# Patient Record
Sex: Male | Born: 1945
Health system: Southern US, Community
[De-identification: ages and names within clinical notes are randomized; demographics above are authoritative.]

## PROBLEM LIST (undated history)

## (undated) DIAGNOSIS — N200 Calculus of kidney: Secondary | ICD-10-CM

## (undated) DIAGNOSIS — K219 Gastro-esophageal reflux disease without esophagitis: Secondary | ICD-10-CM

## (undated) DIAGNOSIS — I4892 Unspecified atrial flutter: Secondary | ICD-10-CM

## (undated) DIAGNOSIS — K5792 Diverticulitis of intestine, part unspecified, without perforation or abscess without bleeding: Secondary | ICD-10-CM

## (undated) DIAGNOSIS — I10 Essential (primary) hypertension: Secondary | ICD-10-CM

## (undated) DIAGNOSIS — I639 Cerebral infarction, unspecified: Secondary | ICD-10-CM

## (undated) DIAGNOSIS — I2699 Other pulmonary embolism without acute cor pulmonale: Secondary | ICD-10-CM

## (undated) DIAGNOSIS — M1990 Unspecified osteoarthritis, unspecified site: Secondary | ICD-10-CM

## (undated) DIAGNOSIS — G4730 Sleep apnea, unspecified: Secondary | ICD-10-CM

## (undated) DIAGNOSIS — N179 Acute kidney failure, unspecified: Secondary | ICD-10-CM

## (undated) DIAGNOSIS — E785 Hyperlipidemia, unspecified: Secondary | ICD-10-CM

## (undated) DIAGNOSIS — R51 Headache: Secondary | ICD-10-CM

## (undated) DIAGNOSIS — F32A Depression, unspecified: Secondary | ICD-10-CM

## (undated) DIAGNOSIS — F1011 Alcohol abuse, in remission: Secondary | ICD-10-CM

## (undated) DIAGNOSIS — Z8719 Personal history of other diseases of the digestive system: Secondary | ICD-10-CM

## (undated) DIAGNOSIS — Z9289 Personal history of other medical treatment: Secondary | ICD-10-CM

## (undated) DIAGNOSIS — M549 Dorsalgia, unspecified: Secondary | ICD-10-CM

## (undated) DIAGNOSIS — F329 Major depressive disorder, single episode, unspecified: Secondary | ICD-10-CM

## (undated) DIAGNOSIS — R569 Unspecified convulsions: Secondary | ICD-10-CM

## (undated) DIAGNOSIS — I2510 Atherosclerotic heart disease of native coronary artery without angina pectoris: Secondary | ICD-10-CM

## (undated) HISTORY — DX: Depression, unspecified: F32.A

## (undated) HISTORY — DX: Atherosclerotic heart disease of native coronary artery without angina pectoris: I25.10

## (undated) HISTORY — DX: Diverticulitis of intestine, part unspecified, without perforation or abscess without bleeding: K57.92

## (undated) HISTORY — PX: APPENDECTOMY: SHX54

## (undated) HISTORY — PX: UMBILICAL HERNIA REPAIR: SUR1181

## (undated) HISTORY — PX: CERVICAL FUSION: SHX112

## (undated) HISTORY — DX: Acute kidney failure, unspecified: N17.9

## (undated) HISTORY — PX: PARTIAL COLECTOMY: SHX5273

## (undated) HISTORY — DX: Hyperlipidemia, unspecified: E78.5

## (undated) HISTORY — DX: Major depressive disorder, single episode, unspecified: F32.9

## (undated) HISTORY — DX: Alcohol abuse, in remission: F10.11

## (undated) HISTORY — DX: Personal history of other medical treatment: Z92.89

## (undated) HISTORY — PX: BACK SURGERY: SHX140

## (undated) HISTORY — PX: ORCHIECTOMY: SHX2116

## (undated) HISTORY — DX: Essential (primary) hypertension: I10

## (undated) HISTORY — PX: CARDIAC CATHETERIZATION: SHX172

## (undated) HISTORY — DX: Dorsalgia, unspecified: M54.9

## (undated) SURGERY — EGD (ESOPHAGOGASTRODUODENOSCOPY)
Anesthesia: Moderate Sedation

---

## 2001-01-26 ENCOUNTER — Other Ambulatory Visit: Admission: RE | Admit: 2001-01-26 | Discharge: 2001-01-26 | Payer: Self-pay | Admitting: Gastroenterology

## 2001-01-26 ENCOUNTER — Encounter (INDEPENDENT_AMBULATORY_CARE_PROVIDER_SITE_OTHER): Payer: Self-pay | Admitting: Specialist

## 2001-01-29 ENCOUNTER — Encounter: Admission: RE | Admit: 2001-01-29 | Discharge: 2001-01-29 | Payer: Self-pay | Admitting: Gastroenterology

## 2001-01-29 ENCOUNTER — Encounter: Payer: Self-pay | Admitting: Gastroenterology

## 2001-03-05 ENCOUNTER — Encounter (HOSPITAL_BASED_OUTPATIENT_CLINIC_OR_DEPARTMENT_OTHER): Payer: Self-pay | Admitting: Internal Medicine

## 2001-03-05 ENCOUNTER — Encounter: Admission: RE | Admit: 2001-03-05 | Discharge: 2001-03-05 | Payer: Self-pay | Admitting: Internal Medicine

## 2001-09-18 ENCOUNTER — Emergency Department (HOSPITAL_COMMUNITY): Admission: EM | Admit: 2001-09-18 | Discharge: 2001-09-18 | Payer: Self-pay | Admitting: Emergency Medicine

## 2001-09-18 ENCOUNTER — Encounter: Payer: Self-pay | Admitting: Emergency Medicine

## 2002-06-20 ENCOUNTER — Ambulatory Visit (HOSPITAL_COMMUNITY): Admission: RE | Admit: 2002-06-20 | Discharge: 2002-06-20 | Payer: Self-pay | Admitting: Family Medicine

## 2002-08-24 ENCOUNTER — Encounter: Payer: Self-pay | Admitting: General Surgery

## 2002-08-31 ENCOUNTER — Encounter (INDEPENDENT_AMBULATORY_CARE_PROVIDER_SITE_OTHER): Payer: Self-pay | Admitting: Specialist

## 2002-08-31 ENCOUNTER — Inpatient Hospital Stay (HOSPITAL_COMMUNITY): Admission: RE | Admit: 2002-08-31 | Discharge: 2002-09-06 | Payer: Self-pay | Admitting: General Surgery

## 2002-11-06 ENCOUNTER — Encounter: Payer: Self-pay | Admitting: Emergency Medicine

## 2002-11-06 ENCOUNTER — Emergency Department (HOSPITAL_COMMUNITY): Admission: EM | Admit: 2002-11-06 | Discharge: 2002-11-07 | Payer: Self-pay | Admitting: Emergency Medicine

## 2002-11-17 ENCOUNTER — Encounter: Payer: Self-pay | Admitting: Neurology

## 2002-11-17 ENCOUNTER — Ambulatory Visit (HOSPITAL_COMMUNITY): Admission: RE | Admit: 2002-11-17 | Discharge: 2002-11-17 | Payer: Self-pay | Admitting: Neurology

## 2003-01-06 ENCOUNTER — Emergency Department (HOSPITAL_COMMUNITY): Admission: EM | Admit: 2003-01-06 | Discharge: 2003-01-06 | Payer: Self-pay | Admitting: Emergency Medicine

## 2003-01-06 ENCOUNTER — Encounter: Payer: Self-pay | Admitting: Emergency Medicine

## 2003-01-07 ENCOUNTER — Ambulatory Visit (HOSPITAL_COMMUNITY): Admission: RE | Admit: 2003-01-07 | Discharge: 2003-01-07 | Payer: Self-pay | Admitting: Neurology

## 2003-08-27 DIAGNOSIS — I2699 Other pulmonary embolism without acute cor pulmonale: Secondary | ICD-10-CM

## 2003-08-27 HISTORY — PX: CORONARY ARTERY BYPASS GRAFT: SHX141

## 2003-08-27 HISTORY — DX: Other pulmonary embolism without acute cor pulmonale: I26.99

## 2003-12-21 ENCOUNTER — Inpatient Hospital Stay (HOSPITAL_COMMUNITY): Admission: AD | Admit: 2003-12-21 | Discharge: 2003-12-26 | Payer: Self-pay | Admitting: Cardiology

## 2004-01-16 ENCOUNTER — Encounter (HOSPITAL_COMMUNITY): Admission: RE | Admit: 2004-01-16 | Discharge: 2004-04-15 | Payer: Self-pay | Admitting: Cardiology

## 2004-02-10 ENCOUNTER — Emergency Department (HOSPITAL_COMMUNITY): Admission: EM | Admit: 2004-02-10 | Discharge: 2004-02-10 | Payer: Self-pay | Admitting: Emergency Medicine

## 2005-05-28 ENCOUNTER — Emergency Department (HOSPITAL_COMMUNITY): Admission: EM | Admit: 2005-05-28 | Discharge: 2005-05-28 | Payer: Self-pay | Admitting: Emergency Medicine

## 2005-06-05 ENCOUNTER — Inpatient Hospital Stay (HOSPITAL_COMMUNITY): Admission: EM | Admit: 2005-06-05 | Discharge: 2005-06-06 | Payer: Self-pay | Admitting: Emergency Medicine

## 2006-09-25 ENCOUNTER — Inpatient Hospital Stay (HOSPITAL_COMMUNITY): Admission: EM | Admit: 2006-09-25 | Discharge: 2006-09-28 | Payer: Self-pay | Admitting: Emergency Medicine

## 2006-09-25 ENCOUNTER — Ambulatory Visit: Payer: Self-pay | Admitting: Family Medicine

## 2006-09-30 ENCOUNTER — Encounter: Payer: Self-pay | Admitting: Cardiology

## 2006-09-30 ENCOUNTER — Inpatient Hospital Stay (HOSPITAL_COMMUNITY): Admission: EM | Admit: 2006-09-30 | Discharge: 2006-10-03 | Payer: Self-pay | Admitting: Emergency Medicine

## 2006-09-30 ENCOUNTER — Ambulatory Visit: Payer: Self-pay | Admitting: Internal Medicine

## 2006-10-02 ENCOUNTER — Encounter (INDEPENDENT_AMBULATORY_CARE_PROVIDER_SITE_OTHER): Payer: Self-pay | Admitting: *Deleted

## 2006-10-22 ENCOUNTER — Ambulatory Visit (HOSPITAL_COMMUNITY): Admission: RE | Admit: 2006-10-22 | Discharge: 2006-10-22 | Payer: Self-pay | Admitting: Emergency Medicine

## 2006-11-09 ENCOUNTER — Inpatient Hospital Stay (HOSPITAL_COMMUNITY): Admission: EM | Admit: 2006-11-09 | Discharge: 2006-11-15 | Payer: Self-pay | Admitting: Emergency Medicine

## 2009-07-31 ENCOUNTER — Encounter: Admission: RE | Admit: 2009-07-31 | Discharge: 2009-07-31 | Payer: Self-pay | Admitting: Neurosurgery

## 2009-09-01 ENCOUNTER — Ambulatory Visit (HOSPITAL_COMMUNITY): Admission: RE | Admit: 2009-09-01 | Discharge: 2009-09-02 | Payer: Self-pay | Admitting: Neurosurgery

## 2010-06-19 ENCOUNTER — Ambulatory Visit: Payer: Self-pay | Admitting: Cardiology

## 2010-11-11 LAB — BASIC METABOLIC PANEL
Chloride: 102 mEq/L (ref 96–112)
Creatinine, Ser: 0.92 mg/dL (ref 0.4–1.5)
GFR calc Af Amer: >60 mL/min (ref >60)
Potassium: 4.2 mEq/L (ref 3.5–5.1)

## 2010-11-11 LAB — CBC
HCT: 46.4 % (ref 39.0–52.0)
MCV: 97.3 fL (ref 78.0–100.0)
RBC: 4.77 MIL/uL (ref 4.22–5.81)
WBC: 9.1 10*3/uL (ref 4.0–10.5)

## 2010-11-23 ENCOUNTER — Other Ambulatory Visit: Payer: Self-pay | Admitting: *Deleted

## 2010-11-23 DIAGNOSIS — I2510 Atherosclerotic heart disease of native coronary artery without angina pectoris: Secondary | ICD-10-CM

## 2010-11-23 MED ORDER — NITROGLYCERIN 0.4 MG SL SUBL: 0.4000 mg | SUBLINGUAL_TABLET | SUBLINGUAL | Status: DC | PRN

## 2010-11-23 NOTE — Telephone Encounter (Signed)
escribe medication per fax request  

## 2011-01-11 NOTE — H&P (Signed)
[name], [name] NO.:  [id number]   MEDICAL RECORD NO.:  [id number]          PATIENT TYPE:  INP   LOCATION:  2903                         FACILITY:  [facility]   PHYSICIAN:  Elveria Rising. Damita Dunnings, M.D. DATE OF BIRTH:  May 09, 1946   DATE OF ADMISSION:  09/29/2006  DATE OF DISCHARGE:                              HISTORY & PHYSICAL   CHIEF COMPLAINT:  Consult for continuity of care.   HISTORY OF PRESENT ILLNESS:  The patient is a 65 year old [name] male  with a history of recent admission for rehydration with subsequent acute  renal failure.  This was resolved when the patient was discharged home.  He returned to [facility] one day later after several hours of feeling  weak.  He was diagnosed with atrial flutter in the emergency room and  was treated with adenosine and diltiazem.  He was admitted to the  cardiology service.  He is now in sinus tachycardia.  Note that he has  had a history of intermittent antibiotic treatment for the last few  months by Dr. [name], his GI doctor.  He has had increasing diarrhea  after he was discontinued from antibiotics a few weeks ago.  Now he has  persistent profuse diarrhea but little to no abdominal pain.   PAST MEDICAL HISTORY:  1. History of CABG.  2. History of total colectomy in 2004.  3. History of seizure disorder.  4. Major depressive disorder.  5. Orchiectomy.  6. History of umbilical hernia repair.  7. Dyslipidemia.  8. History of appendectomy.  9. History of cervical fusion.  10.Asthma as a child.  11.Other history as above.   SOCIAL HISTORY:  He has a history of significant alcohol use, but none  now.  He is not smoking.  He lives with his wife.  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   FAMILY HISTORY:  His father died in his 68s of a AAA.  His mother died  at 30 of CHF and old age.  His daughter died at age 62 with brain  cancer.   MEDICATIONS:  Currently Maxipime, Aricept, Lovenox, Lexapro, Zetia,  Keppra,  Ambien, Xopenex, Zocor, Protonix, Corgard, Provigil.   ALLERGIES:  DOXYCYCLINE AND KEFLEX.   REVIEW OF SYSTEMS:  Positive fever and chills.  He has occasional dry  cough with scant to no sputum production.  He has minimal dysuria.  He  was recently catheterized with a Foley.  He has no vomiting.  He is  taking p.o. with no rash.  Otherwise has no new symptoms.   LABORATORY DATA:  Stool cultures are negative.  Blood cultures and urine  cultures are pending.  Troponins went from 0.09 to 0.10.  EKG shows  sinus tachycardia now.  Electrolytes within normal limits.  TSH 2.7, BNP  122, Kreis count 7.6.  Cortisol, C. difficile, Gram stain, echo, and  right upper quadrant ultrasound were all pending.   PHYSICAL EXAMINATION:  Vital signs:  Temperature 100.6 this morning.  Now currently afebrile.  Pulse 93.  Blood pressure 101/57.  SaO2 is 97%.  Respiratory rate  20.  GENERAL:  In no apparent distress.  HEENT:  Normocephalic, atraumatic. Mucous membranes are dry.  Neck:  Supple, no lymphadenopathy.  CARDIOVASCULAR:  Tachy with no murmur, S1 and S2 are within normal  limits.  He has a positive sternotomy scar.  RESPIRATORY:  Course breath sounds bilaterally with slight wheeze.  ABDOMEN:  Soft, positive bowel sounds, nontender.  No right upper  quadrant pain, no rebound.  No guarding.  EXTREMITIES:  No edema.  NEURO:  Motor and sensation along with cranial nerves are intact.  SKIN:  No rash.   ASSESSMENT/PLAN:  The patient is a 65 year old with the following  problems:  1. Diarrhea, Clostridium difficile pending.  He had a mild increase in      his LFTs.  His abdominal ultrasound is pending.  Will also talk to      Dr. [name] regarding his recent antibiotic use.  He is covered      with Cefepime in the meantime.  We will continue with IV      supplementation p.r.n. and follow his electrolytes.  __________      may also be reasonable given the amount of diarrhea he is having.  2. Sinus  tachycardia.  The patient has atrial flutter which has      resolved.  His troponins are mildly increased secondary to strain      per cardiology.  Will continue IV fluids.  This is potentially      secondary to dehydration, +/- fever.  He is on a beta blocker per      cardiology.  3. Hypotension, secondary to problem #1.  His cortisol is pending.  4. Seizure disorder, on Keppra.  5. Deep venous thrombosis and gastrointestinal prophylaxis of Lovenox      and proton pump inhibitor.  6. Wheeze, continue Xopenex.  7. Will transfer to [facility].  I was unable to      leave orders as he was being wheeled out of the room for his echo      and ultrasound.  Family Practice Teaching Service will follow up.      Elveria Rising Damita Dunnings, M.D.     GSD/MEDQ  D:  09/30/2006  T:  10/01/2006  Job:  [id number]

## 2011-01-11 NOTE — Discharge Summary (Signed)
NAME:  [name], [name] NO.:  [id number]   MEDICAL RECORD NO.:  [id number]          PATIENT TYPE:  INP   LOCATION:  0375                         FACILITY:  [facility]   PHYSICIAN:  Doree Albee, M.D.DATE OF BIRTH:  1946-04-08   DATE OF ADMISSION:  11/09/2006  DATE OF DISCHARGE:  11/15/2006                               DISCHARGE SUMMARY   ADDENDUM:   FINAL DISCHARGE DIAGNOSES:  1. Chronic diarrhea with acute diarrhea secondary to probable      campylobacter species.  2. Altered mental status/delirium, resolved.  3. Dehydration and acute renal failure, resolved.  4. Seizure disorder.  5. Hypertension.  6. Obstructive sleep apnea syndrome.   DISCHARGE MEDICATIONS:  1. Aspirin 81 mg daily.  2. Keppra 750 mg b.i.d.  3. Lexapro 10 mg daily.  4. Ativan 1 mg b.i.d.  5. Multivitamins 1 tablet daily.  6. Corgard 40 mg daily.  7. Biaxin 500 mg b.i.d. for 5 days only.   CONDITION ON DISCHARGE:  Stable.   HISTORY OF PRESENT ILLNESS:  This 65 year old man was admitted with  diarrhea, which was acute on chronic and some altered mental status.  Please see discharge summary done by Dr. Vernell Leep.   HOSPITAL COURSE:  The patient continued to improve with his diarrhea and  on the day of discharge, he only had loose stools with no abdominal pain  and no fever.   PHYSICAL EXAMINATION:  VITAL SIGNS:  Temperature 98, blood pressure  154/88, pulse 65, respiratory rate 12. Saturation 96% on room air.  LUNGS:  Fields were clear to auscultation.  ABDOMEN:  Soft and nontender.   LABORATORY DATA:  Sodium 138, potassium 3.7, bicarbonate 25, BUN 10,  creatinine 0.9.   DISPOSITION:  He was treated for campylobacter species with Biaxin 500  mg b.i.d. for 5 days and he should complete this course. Otherwise, he  will continue with the rest of his medication.   FOLLOWUP:  I have urged him to followup with his neurologist in [facility], Dr. Ermalene Postin, in terms of his  medications that he is taking and also  to followup with his primary care physician, which I believe is at the  urgent care in the next 2 to 4 weeks.      Doree Albee, M.D.  Electronically Signed     NCG/MEDQ  D:  11/15/2006  T:  11/15/2006  Job:  [id number]

## 2011-01-11 NOTE — Discharge Summary (Signed)
[name], [name] NO.:  [id number]   MEDICAL RECORD NO.:  [id number]          PATIENT TYPE:  INP   LOCATION:  3701                         FACILITY:  [facility]   PHYSICIAN:  Kasandra Knudsen, M.D.   DATE OF BIRTH:  Feb 01, 1946   DATE OF ADMISSION:  09/29/2006  DATE OF DISCHARGE:  10/03/2006                               DISCHARGE SUMMARY   ADMITTING DIAGNOSES:  1. Diarrhea.  2. Questionable sepsis.  3. Seizure disorder.  4. Sinus tachycardia.  5. History of coronary artery bypass graft.  6. Depression.   DISCHARGE DIAGNOSES:  1. Diarrhea.  2. Urinary tract infection.  3. Seizure disorder.  4. History of coronary artery bypass graft.  5. Depression.  6. Wheezing.   HOSPITAL COURSE:  This is a 65 year old [name] male who was recently  discharged on February 3 and subsequently returned to the emergency room  with questionable atrial flutter, sinus tachycardia and hypotension.  He  was initially admitted to the cardiology service and was then  transferred to the [facility] Service.   HOSPITAL COURSE:  1. Questionable sepsis:  Initially, it was felt that this gentleman      had sepsis given his hypotension and hemodynamic instability.  He      also had a low grade fever.  Blood cultures were drawn and      initially showed a gram-positive cocci and clusters.  Therefore,      vancomycin was started.  This patient also received fluids to help      with his hypotension.  His questionable atrial flutter was treated      with adenosine and diltiazem initially; however, cardiology feels      that he was in sinus tachycardia, he never was in atrial flutter.      He resumed for certain to normal sinus rhythm the next day after      his admission.  As for his sepsis, we continued the vancomycin      until the blood cultures came back, which showed it was a      contaminant.  We then switched him over to p.o. Cipro given that      his urine  culture did grow out E.Coli.  Therefore, we feel that      some of his illness was related to a urinary tract infection.  He      was discharged with a 10 more day course of antibiotics.  2. Diarrhea:  This patient is known to have Crohn disease and had a      colectomy a number of years ago.  He continued to have diarrhea,      though it has been worsening.  GI was consulted and was following      the patient and they did a flex sig and an upper GI follow through      while he was here.  They are unclear as the etiology, however,      restarted him back on his Xifaxan, which seemed to help his      diarrhea.  His flex sig did not show any malignancy or anything      abnormal.  His stool cultures were negative.  It is unknown as to      why he has this diarrhea.  It did improve over the hospital course,      however.  3. Sinus tachycardia:  Initially, it was thought this patient had      atrial flutter; however, cardiology feels that it never was atrial      flutter.  They did give him adenosine and diltiazem; however, on      day of discharge he was in normal sinus rhythm and did not have      further problems.  He does have cardiologist that he will follow up      with as needed.  4. Seizure disorder:  We continued this patient's Keppra while he was      in the hospital.  He did not have any seizures.  He was discharged      on his home regimen.  He will follow up with his primary care      physician.  5. Urinary tract infection:  Please see above regarding sepsis.      However, urine cultures show E.Coli that was pansensitive.  We      switched him over to ciprofloxacin given his allergies.  On day of      discharge, he was much improved and was not having any symptoms and      overall looked very well.  6. Wheezing:  This patient continued to have wheezes throughout his      hospital stay despite scheduled Xopenex.  However, at time of      discharge, he was remarkably improved.  He  needs to follow up with      his primary care physician to explore potential of asthma.  There      is a remote history of asthma as a child per my previous H&P that      was dictated.   DISCHARGE MEDICATIONS:  1. Keppra 750 one tablet p.o. b.i.d.  2. Aricept 10 mg 1 tablet p.o. q.h.s.  3. Provigil 200 mg 1 tablet p.o. daily.  4. Zetia 10 mg 1 tablet p.o. daily.  5. Lexapro 10 mg 1 tablet p.o. q.h.s.  6. Zocor 1 tablet p.o. q.h.s.  7. Corgard 120 mg 1 tablet p.o. daily.  8. Albuterol 1-2 puffs q.4-6 hours p.r.n. wheezing.  9. Cipro 500 mg 1 tablet p.o. b.i.d. x10 more days.  10.Ambien 10 mg 1 tablet p.o. q.h.s.  11.Prednisone 20 mg take 3 tablets daily x7 days.  12.Robitussin DM 1 teaspoon q.6 hours as needed for cough.  13.Xifaxan 200 mg take 2 tablets p.o. b.i.d.   DISCHARGE INSTRUCTIONS:  Patient was discharged with instructions to  follow up with his primary care [name], which is [facility], early in the week.  He also has an appointment with a  gastroenterologist and he will need a call for a followup appointment.  He needs to see the GI doctors in the next 2-6 weeks.   DISCHARGE CONDITION:  Improved.   IMAGES:  Images that were performed during this hospital stay were an  upper GI small bowel.  There was surgical biopsies taken and a flex sig.  Chest x-ray x2.  Abdominal x-ray x1.  An abdominal ultrasound x1 and a  2D echo.  Please note that all films were essentially negative and the  echo showed a normal ejection fraction.   PERTINENT LABS:  Stool cultures negative.  An initial blood culture that  was identified as staph species, Coag negative.  It was decided that  this was contaminant.  C.diff negative.  Basic metabolic panel, on day  of discharge, was within normal limits.  CBC, on day of discharged,  showed an elevated [name] blood cell count of 12.5; however, this was improved over the hospital course.  He does have anemia, 11.1, this has  been  relatively stable.  Urine cultures, as previously dictated, E.Coli.  All other labs, which included a TSH, which was normal.  BNP was mildly  elevated at 122.  Magnesium low at 1.3.  His troponins, although mildly  elevated, per cardiology, this is secondary to his tachycardia.  The  highest the troponin got was 0.48.  This concludes the pertinent labs  during his hospital course.           ______________________________  Kasandra Knudsen, M.D.     JT/MEDQ  D:  10/05/2006  T:  10/06/2006  Job:  [id number]   cc:   [facility]  Almyra Free A. Jimmye Norman, M.D.

## 2011-01-11 NOTE — Discharge Summary (Signed)
[name], CREAR NO.:  [id number]   MEDICAL RECORD NO.:  [id number]          PATIENT TYPE:  INP   LOCATION:  6629                         FACILITY:  [facility]   PHYSICIAN:  Vernell Leep, MD     DATE OF BIRTH:  [birth year]   DATE OF ADMISSION:  11/09/2006  DATE OF DISCHARGE:                               DISCHARGE SUMMARY   This is an interim discharge summary.   PRIMARY CARE PHYSICIAN:  at [facility].   DISCHARGE DIAGNOSES:  1. Chronic diarrhea.  2. Altered mental status/delirium.  3. Dehydration and acute renal failure.  4. Seizure disorder.  5. Hypertension.  6. Obstructive sleep apnea syndrome.  7. B12 deficiency   DISCHARGE MEDICATIONS:  To be finalized on actual discharge.   PROCEDURES:  1. November 09, 2006:  Chest x-ray.  Impression:  Status post CABG.      Question minimal left lower lobe atelectasis.  2. November 09, 2006:  CT of the head without contrast.  Impression:  No      acute intracranial abnormality with chronic microvascular ischemic      change and scattered infarcts, as above.  Acute bilateral maxillary      sinusitis.   LAB DATA:  To be finalized on actual discharge.  Ammonia normal at 28,  hemoglobin A1C 6.1, TSH 1.851, B12 255.  Urine toxicology positive for  benzodiazepines.   CONSULTATIONS:  1. GI:  [facility].  2. Neurology:  Dr. Brett Fairy, pending.  3. Psychiatric:  Dr. Rhona Raider, pending.   HOSPITAL COURSE:  For details of the initial part of the admission,  please refer to the history and physical note that was done by Dr.  Lucy Chris on November 09, 2006.  In summary, Mr. Deyton is a 38-year-  old pleasant male patient status post colectomy for Crohn's disease,  seizure disorder, who was recently hospitalized for diarrhea and acute  renal failure, represented to the emergency room, brought by his wife  with acute onset of altered mental status of two days duration with  hallucinations, increased somnolence, disorientation,  and profuse  diarrhea.  He was admitted for further evaluation and management.   1. Diarrhea, which seems to be an acute-on-chronic diarrhea.  The      etiology of which is still undetermined.  The differential      diagnoses include infectious versus inflammatory versus      malabsorption syndrome.  Patient was admitted to the hospital.  His      stool studies were sent off for ova, cysts, and parasite, culture      and C. diff, which have so far been negative.  The stool      lactoferrin, however, is positive.  GI was consulted, who proceeded      to place a rectal tube.  The patient also was placed on IV fluids.      Will follow GI's recommendations.   1. Altered mental status, the etiology of which is also unclear.  The      differential diagnosis includes overdose of multiple medications  that the patient was on at home, which included sleeping pills,      antidepressants.  Also, the other differential diagnosis included      delirium from his acute illness, related to psychiatric illness or      seizures.  The patient was initially placed on a small dose of      benzodiazepine to prevent withdrawal because he was on      benzodiazepines at home; however, the patient continued to be      agitated, where he required to be restrained.  Patient was then      placed on Ativan withdrawal protocol.  He was also provided      thiamine and folate.  The patient's mental status has made slight      improvement since then.  The patient is now less frequently      agitated.  He is oriented in person and place.  He is pleasantly      confused.  He obeys commands.  He is able to hold small      conversations.  Neurology and a psychiatric consult has been      requested.  An EEG has been ordered and is pending.  The patient's      ammonia level is normal.  Patient has been continued on his      antiepileptic medication of Keppra.  The pills that the patient      might have been taking at  home are Ativan, unknown dose, Ambien,      Benadryl, temazepam.   1. Dehydration and acute renal failure which was secondary to his      profuse diarrhea:  Patient was IV hydrated, and this has since      resolved.  Patient's creatinine on admission was 2.32, which has      now normalized.   1. Seizure disorder:  There are no obvious episodes of seizures in the      hospital, as indicated.  EEG has been requested and is pending.      Patient was continued on his Keppra, and neuro consult is pending.   1. Hypertension, which has been fairly controlled.   1. Obstructive sleep apnea syndrome:  Patient has been placed on home      settings of his BiPAP.   1. Coronary artery disease:  Patient has not complained of any chest      pain, and he is status post CABG.  The patient is continued on his      aspirin.   1. B12 deficiency. Patient received an IM B12 injection and followed      by oral B12. Further eval as deemed necessary as outpatient and      rechek his B12 levels.      Vernell Leep, MD  Electronically Signed     AH/MEDQ  D:  11/12/2006  T:  11/12/2006  Job:  [id number]   cc:   Larey Seat, M.D.  Fax: [phone]   Felizardo Hoffmann, M.D.

## 2011-01-11 NOTE — Consult Note (Signed)
NAME:  RANGEL, ECHEVERRI                           ACCOUNT NO.:  [id number]   MEDICAL RECORD NO.:  [id number]                   PATIENT TYPE:  EMS   LOCATION:  ED                                   FACILITY[facility]   PHYSICIAN:  Alyson Locket. Love, M.D.                 DATE OF BIRTH:  [birth year]   DATE OF CONSULTATION:  01/06/2003  DATE OF DISCHARGE:                                   CONSULTATION   PATIENT'S ADDRESS:  [address] [place], [employer], [address] [place], [place].   INTRODUCTION:  This 65 year old, right-handed, [employer] married male has a  known history of hypertension and prior history of syncope versus seizure.  Today, he is seen in the emergency room for evaluation of a suspected  seizure.   HISTORY OF PRESENT ILLNESS:  The patient had an episode on November 06, 2002,  when he was in a grocery store.  He was found on the floor, was confused and  somewhat combative afterwards when seen in the [facility] Emergency  Room.  A CT scan of the brain was performed which was unremarkable, and  blood studies were negative.  As an outpatient MRI study and EEG were  unremarkable, and he was placed on seizure precautions but no medications.  He has no history of drug or alcohol abuse.  He had been on chronic Ativan  therapy, discontinued several months ago.  He had been continuing his job at  work and doing limited driving.  On the day of his visit to the emergency  room he had been at home after work, getting ready to ride a riding  lawnmower.  He turned it on.  He apparently became confused.  He remembers  looking up and felt something grab me.  He was then on his lawnmower.  He  remembers going around in a circle on the lawn, got off, tried to go inside,  and was fumbling with the door knob.  His wife noted that he was very [place],  pale, and called 911.  He apparently had bitten his tongue.  There was no  urinary or bowel  incontinence.  There has been no history of megalopsia,  micropsia, deja vu, strange odors or tastes.   MEDICATIONS:  1. Corgard 120 mg b.i.d.  2. Zocor 80 mg daily.  3. Prinivil 20 mg daily.   SOCIAL HISTORY:  He does not drink alcohol.   PHYSICAL EXAMINATION:  GENERAL:  Well-developed Junker male.  VITAL SIGNS:  Blood pressure right and left arms 140/80, heart rate is 84.  NECK:  There were no cranial, cervical, or other bruits heard.  Neck flexion  and extension maneuvers were unremarkable.  NEUROLOGIC:  Mental status:  He was alert and oriented x3.  His cranial  nerve examination revealed visual fields full.  Discs were flat.  The  extraocular movements were full.  Corneas were present.  There was no  seventh nerve palsy.  Hearing was intact.  Air conduction was greater than  bone conduction.  Tongue was midline.  The uvula was midline.  Gags were  present.  Motor examination revealed good strength in the upper and lower  extremities.  Coordination testing was normal, and sensory examination was  intact to pinprick, light touch, normal position and vibration testing.  Deep tendon reflexes were 1 to 2+, and plantar responses were downgoing.    IMPRESSION:  Seizures x2, Code 345.1.   PLAN:  Our plan is to obtain an EEG, place him on seizure precautions, and  start anticonvulsants after the EEG.                                               Alyson Locket. Erling Cruz, M.D.    JML/MEDQ  D:  01/06/2003  T:  01/06/2003  Job:  [id number]

## 2011-01-11 NOTE — H&P (Signed)
NAME:  BLISS, TSANG NO.:  [id number]   MEDICAL RECORD NO.:  [id number]          PATIENT TYPE:  EMS   LOCATION:  MAJO                         FACILITY:  [facility]   PHYSICIAN:  Jonna L. Gwynneth Aliment, M.D.DATE OF BIRTH:  [birth year]   DATE OF ADMISSION:  06/04/2005  DATE OF DISCHARGE:                                HISTORY & PHYSICAL   PRIMARY CARE PHYSICIAN:  Urgent [facility].   NEUROLOGIST:  Dr. Ermalene Postin in [place], [place].   CARDIOLOGIST:  Peter M. Martinique, M.D.   CHIEF COMPLAINT:  Does not feel good.   HISTORY OF PRESENT ILLNESS:  This is a 65 year old [place] male with a history  of seizures who has presently been on Keppra.  At one point 3 years ago he  had been on Depakote. The description of these seizures are described as  confusion, getting very red in the face, and altered mental status.  The  descriptions do not seem to be major tonic clonic.  The last few  descriptions that have been present.  The last time he had an episode was  May 28, 2005, which his wife again describes as more of a confusion.  He  does not have episodes where he stares into space.  He has been having  issues with depression and so was started on Remeron on October 4, and ever  since then he has been increasingly confused, restless, having difficulty  sleeping, picking at clothes.   ALLERGIES:  DOXYCYCLINE, KEFLEX, TALWIN.   MEDICATIONS:  1.  Prinivil 40.  2.  Corgard 120.  3.  Keppra 750 b.i.d.  4.  Zocor 80.  5.  Zetia 10.  6.  Ativan 1 mg b.i.d.   PAST MEDICAL HISTORY:  1.  Obstructive sleep apnea, however, he has not been doing well with the      BiPAP.  He tends to pull it off and refuse to use it so he has been      having increasing difficulty with sleeping and has often been having      episodes of being very drowsy and confused during the day.  2.  Hypertension.  3.  Hypercholesterolemia.  4.  Coronary artery disease.  He had an episode of one of these  confusions      and got red in the face and when they brought him to the Urgent Care, he      was found to have an abnormal EKG.  He was put in the hospital and had a      subsequent coronary artery bypass grafting for extensive three or four-      vessel disease.  5.  Depression.  The patient's daughter died 3 years ago from a brain tumor      and every July on the anniversary date, the patient becomes very      depressed.  However, this year on top of that depression he was passed      over for a job because of his health and has become more depressed about      that.  FAMILY HISTORY:  His father died at 19 of a thoracic aneurysm.  Mother died  at 77 with dementia and heart failure.  He has one sister who is healthy.   SOCIAL HISTORY:  Married.  One daughter lives in [place] and one died 3  years ago from a tumor.  He is a nonsmoker.  A number of years ago, he had  become addicted to alcohol and prescription drugs.  He has not been so for a  number of years, but he tends to get hooked on medications very easily and  his wife makes sure she locks everything up.   REVIEW OF SYSTEMS:  No fever or chills.  He has problems with loose bowels  ever since he had his colectomy.  Complains of low back pain.  He has  recently had a runny nose and cough.  He has been hard of hearing for many  years.  Other review of systems were asked and were negative.   PAST SURGICAL HISTORY:  Colectomy, coronary artery bypass grafting, cervical  laminectomy 12 years ago, umbilical hernia, undescended testicle,  appendectomy, tonsillectomy, right wrist bone aneurysm, and inguinal  hernias.  He had an undescended testicle removed.   PHYSICAL EXAMINATION:  VITAL SIGNS:  97.1, pulse 83, respirations 18, blood  pressure 119/73.  GENERAL:  He is a well-developed, Caucasian male with occasionally slurred  speech.  He is tired but arousable and does follow commands.  HEENT:  Conjunctivae and lids were normal.   Pupils are reactive.  Extraocular movements were full.  Normal hearing.  Mucosa and pharynx.  NECK:  Showed no mass or thyromegaly.  There were no carotid bruits.  LUNGS:  Respiratory effort was normal.  Lungs were clear to A&P with no  wheezing, rales, rhonchi, or dullness.  HEART:  Regular rate and rhythm.  Normal S1 and S2 without murmurs, rubs, or  gallops.  Her had a sternotomy scar.  There is no cyanosis, clubbing, or  edema.  ABDOMEN:  Nontender, normal bowel sounds, no hepatosplenomegaly or hernia.  He has multiple surgical scars.  Normal external genitalia.  No cervical  adenopathy.  NEUROLOGY:  Muscle strength 5/5 in all four extremities.  No cogwheeling or  tremor. DTR's were 2+ and equal.  Sensation was intact.  Alert and oriented  x3.  Normal memory, judgment, and affect.   LABORATORY DATA:  CBC, alcohol level, chemistry, cardiac enzymes were all  negative.  Drugs of abuse was positive only for benzodiazepine.   CT of the head showed a progressive encephalomalacia, multiple small vessel  disease, new caudate CVA, some other abnormalities.  Princess Bruins. Hickling,  M.D. thinks there may be an area that is suspicious for a tumor.   IMPRESSION:  1.  Altered mental status, most likely from his new Remeron medicine, but it      could be postictal or the Remeron could have triggered the seizure.  I      am going to discontinue the Remeron.  The patient seemed to be getting      better, according to the wife.  2.  Abnormal CAT scan of the head.  We will have an MRI and EEG.  3.  Coronary artery disease.  4.  Hypercholesterolemia.  5.  Obstructive sleep apnea.  We will try to get his mask readjusted.  Sleep      deprivation also can trigger more      seizures.  6.  Depression.  We will stop the  Remeron and give him a trial of Desyrel.  7.  Seizure disorder.      Jonna L. Gwynneth Aliment, M.D.  Electronically Signed    JLB/MEDQ  D:  06/05/2005  T:  06/05/2005  Job:  [id number]

## 2011-01-11 NOTE — Consult Note (Signed)
NAME:  [name], [name] NO.:  [id number]   MEDICAL RECORD NO.:  [id number]          PATIENT TYPE:  INP   LOCATION:  5956                         FACILITY:  [facility]   PHYSICIAN:  John C. Amedeo Plenty, M.D.    DATE OF BIRTH:  1946/05/01   DATE OF CONSULTATION:  11/09/2006  DATE OF DISCHARGE:                                 CONSULTATION   CONSULTATIONS:  Diarrhea, azotemia, and confusion.   HISTORY OF PRESENT ILLNESS:  The patient is a 65 year old [name] male who  presents for the second or third time with refractory diarrhea,  associated with azotemia and mental status changes. He was in for  admission from September 25, 2006 until October 05, 2006 with similar  presentation. Creatinine is high at 4 with BUN of 55. He was felt to be  clinically dehydrated. He was treated with hydration and his azotemia  resolved. He underwent flexible sigmoidoscopy, upper GI and small bowel  series. The patient had undergone subtotal colectomy in 2004 for  refractory diverticulitis. He has had loose stools since then but more  episodic severe diarrhea since then. He had seen Dr. [name] 5 days ago  and had apparently been taken off of __________, which he has stayed on  to control his diarrhea. Three days ago, his wife noted that he was  having diarrhea, malaise and the next day, had continued diarrhea and  malaise and lethargy. Yesterday morning, he continued to feel poorly and  went to urgent care. He was noted to have hypotension. He was given IV  fluids and felt better. He was sent home. His diarrhea worsened and he  became lethargic, confused, and was brought back last night. The wife  states that he gets medications from the neurologist and also the [facility]. It is concerning that they have been getting Ativan and a  sleeping pill from more than 1 source or taking multiple doses  unwittingly, relative to confusion although she is not clear on any of  this. She also is afraid that he  may have taken both his p.m. and a.m.  blood pressure medications, in the a.m. of November 08, 2006, when he was  found at urgent care to have hypotension. He is still fidgety and  somewhat confused today and still having diarrhea, which is not bloody.  Denies any abdominal pain or vomiting.   PAST MEDICAL HISTORY:  Diverticulitis with status post sub-total  colectomy in 2004.  1. Coronary artery disease.  2. Hyperlipidemia.  3. History of alcohol abuse.  4. History of seizure disorder.  5. History of atrial flutter.   CURRENT MEDICATIONS:  Are not available to me at this point. The  patient's wife relies on his knowledge for these medications and he is  confused. Apparently, the medicines have been given to Dr. Selinda Flavin, who  admitted him.   PAST SURGICAL HISTORY:  CABG, sub-total colectomy, orchiectomy,  umbilical hernia repair, appendectomy, and cervical fusion.   ALLERGIES:  DOXYCYCLINE, KEFLEX.   SOCIAL HISTORY:  The patient lives with his wife. He is a retired  grounds  Therapist, nutritional. Denies cigarette smoking.   FAMILY HISTORY:  Father died of a ruptured aortic aneurysm. He had  congestive heart failure. No family history of GI malignancy.   PHYSICAL EXAMINATION:  GENERAL:  A well developed, well nourished, black  male somewhat agitated and confused. Otherwise, in no acute distress.  VITAL SIGNS:  Temperature 97.8, pulse 80, blood pressure 105/67.  HEENT:  Unremarkable sclerae, nonicteric.  HEART:  Regular rate and rhythm without murmur.  ABDOMEN: Soft, slightly distended with normal active bowel sounds. No  hepatosplenomegaly, mass, or guarding. There is a well healed midline  surgical scar without herniation.   LABORATORY DATA:  Mctavish blood cell count 9,300. Hemoglobin and  hematocrit 13.2 and 38.1. Platelets 298,000. PT 14.4. INR 1.1. BUN 55,  creatinine 0.32. Liver function studies normal.   IMPRESSION:  1. Persistent diarrhea of 4 days duration, superimposed on chronic       diarrhea, related to subtotal colectomy.  2. Azotemia related to dehydration from diarrhea.  3. Confusion, possibly related to azotemia, encephalopathy, poly-      pharmacy, etc.   PLAN:  Will hydrate aggressively and follow BUN. Will check ammonia  level. Obtain recent records from the office regarding previous  diarrhea. Workup, will consider contamination of the workup for chronic  diarrhea if warranted. Will in addition, do C. difficile toxin already  ordered. Will take stool for ova and parasites and enteric pathogens.           ______________________________  Elyse Jarvis Amedeo Plenty, M.D.     JCH/MEDQ  D:  11/09/2006  T:  11/09/2006  Job:  [id number]   cc:   Richardson Landry A. [name], M.D.  Fax: [phone]

## 2011-01-11 NOTE — Consult Note (Signed)
NAMEMAXIMILIEN, HAYASHI NO.:  [id number]   MEDICAL RECORD NO.:  [id number]          PATIENT TYPE:  INP   LOCATION:  6834                         FACILITY:  [facility]   PHYSICIAN:  Felizardo Hoffmann, M.D.  DATE OF BIRTH:  1945/10/08   DATE OF CONSULTATION:  11/12/2006  DATE OF DISCHARGE:  11/15/2006                                 CONSULTATION   REASON FOR CONSULTATION:  Severe agitation as well as confusion along  with substance dependence evaluation.   HISTORY OF PRESENT ILLNESS:  [name] is a 65 year old male  admitted to the [facility] on November 09, 2006.   [name] has been exhibiting approximately 3 days of confusion.  He  also has been having diarrhea.  He does have a history of Crohn disease  as well as a seizure disorder and acute renal failure.   The patient has been having hallucinations, disorientation and clouding  of consciousness.   PAST PSYCHIATRIC HISTORY:  The patient was at [facility] in [place].  He acknowledges a history of  getting prescriptions from multiple providers, prescription hopping.  He used to drink a case of beer per day.  He has no history of mania or  suicide attempts.   He does have a history of severe depression with low energy, low  concentration, anhedonia which has responded to Lexapro very well in the  past.   The patient has not consumed alcohol since January 31 of this year.   FAMILY PSYCHIATRIC HISTORY:  None known.   SOCIAL HISTORY:  [name] is married.  He still works part-time as a  Estate agent.  He is not using any illegal drugs.  He resides in  [place] with his supportive wife.   PAST MEDICAL HISTORY:  Please see the history of present illness.  Mr.  [name] is status post colectomy due to Crohn disease.  He has a history  of coronary artery bypass graft, atrial flutter, seizure disorder,  hyperlipidemia, orchiectomy, appendectomy, cervical  fusion.   MEDICATIONS:  The MAR is reviewed.  The patient is on:  1. Lexapro 10 mg daily.  2. Ativan withdrawal protocol.  3. Folic acid 1 mg few days.  4. Thiamine 100 mg daily.  5. Multivitamin daily.  6. He required Haldol 1 mg early this morning.   ALLERGIES:  CEPHALOSPORINS, VIBRAMYCIN, PENTAZOCINE AND KEFLEX   LABORATORY DATA:  WBC 7.2, hemoglobin 12.0, platelet count 269.  Basic  metabolic panel is unremarkable.  Calcium is 8.7.  His urine drug screen  was positive for benzodiazepines only.  Alcohol was negative.  Ammonia  level 28, SGOT 20, SGPT 19.   Head CT without contrast on March 16 showed no acute intracranial  abnormality.  There was chronic microvascular ischemic change and  scattered infarcts.   REVIEW OF SYSTEMS:  CONSTITUTIONAL:  Afebrile.  No weight loss.  HEAD:  No trauma.  EYES:  No visual changes.  EARS:  No hearing impairment.  NOSE:  No rhinorrhea.  MOUTH/THROAT:  No sore throat.  NEUROLOGIC:  As  above.  PSYCHIATRIC:  As above.  CARDIOVASCULAR:  No chest pain,  palpitations.  RESPIRATORY:  No coughing or wheezing.  GASTROINTESTINAL:  No nausea, vomiting, diarrhea.  GENITOURINARY:  No dysuria.  SKIN:  Unremarkable.  HEMATOLOGY/LYMPHATIC:  Unremarkable.  MUSCULOSKELETAL:  No deformities.  ENDOCRINE/METABOLIC:  Unremarkable.   PHYSICAL EXAMINATION:  VITAL SIGNS:  Temperature 98.1, pulse 79,  respiration 24, blood pressure 151/98, O2 saturation on room air 96%.  GENERAL APPEARANCE:  [name] is a middle-aged male appearing his  chronologic age, lying in supine position in his hospital bed in no  apparent distress.  He does not have any abnormal involuntary movements.  He has a normal body habitus.  He is well-groomed and socially  appropriate.  OTHER MENTAL STATUS EXAM:  His attention span is within normal limits.  His concentration is mildly decreased.  He is oriented to all spheres.  His memory is intact to immediate recent and remote except for the   delirium which he has now recovered from.  His speech involves normal  rate and prosody.  There is no dysarthria.  His fund of knowledge and  intelligence are within normal limits.   Affect is mildly constricted at baseline with a broad appropriate  response.  Mood is mildly depressed.  Thought process logical, coherent,  goal-directed.  No looseness of associations.  His calculating and  abstraction ability are within normal limits.  Thought content:  No  thoughts of harming himself.  No thoughts of harming others.  No  delusions, no hallucinations.  Insight is partial.  Judgment is intact.   ASSESSMENT:  AXIS I:  (293.83)  Mood disorder not otherwise specified,  depressed, improved (functional and general medical factors).  (296.35)  Major depressive disorder recurrent in partial remission.  (293.00)  Delirium not otherwise specified due to multiple factors  including acute general medical factors, now resolved.  Poly substance dependence.  AXIS II:  Deferred.  AXIS III:  See general medical problems.  AXIS IV:  General medical.  AXIS V:  55.   [name] is not at risk to harm himself or others.  His capacity for  informed consent has returned.   The undersigned recommended an inpatient chemical dependency  rehabilitation program.  However, the patient declined, and he is not  committable.  He does want outpatient treatment.   The undersigned provided ego supportive psychotherapy and education.  The indications, alternatives and adverse effects of Lexapro were  discussed with the patient for antidepression.  He understands and would  like Lexapro.   RECOMMENDATIONS:  1. Restart Lexapro at 10 mg daily for antidepression.  2. Would ask the case manager to set this patient up with outpatient      psychiatry options include the clinics attached to [facility], [facility] or [facility].  The patient also has      the option of chemical dependency intensive  outpatient programs in      the area.  3. A 12-step group.      Felizardo Hoffmann, M.D.  Electronically Signed     JW/MEDQ  D:  03/03/2007  T:  03/04/2007  Job:  [id number]

## 2011-01-11 NOTE — Op Note (Signed)
NAME:  [name], [name]                           ACCOUNT NO.:  [id number]   MEDICAL RECORD NO.:  [id number]                   PATIENT TYPE:  INP   LOCATION:  0001                                 FACILITY[facility]  [facility]   PHYSICIAN:  Marland Kitchen T. Hoxworth, M.D.          DATE OF BIRTH:  [birth year]   DATE OF PROCEDURE:  08/31/2002  DATE OF DISCHARGE:                                 OPERATIVE REPORT   PREOPERATIVE DIAGNOSIS:  Diverticulitis.   POSTOPERATIVE DIAGNOSIS:  Diverticulitis.   PROCEDURE:  Subtotal colectomy.   SURGEON:  Darene Lamer. Hoxworth, M.D.   ASSISTANT:  Coralie Keens, M.D.   ANESTHESIA:  General.   BRIEF HISTORY:  [name] is a 65 year old [place] male with a long history of  recurrent episodes of both right and left sided abdominal pain. He has had  an extensive workup by Dr. Sharlett Iles.  This has revealed pandiverticulosis.  During his episodes of abdominal pain, he has had documented inflammatory  change of the sigmoid colon and transverse colon as well. He has also on  colonoscopy had inflammatory changes of the right colon and cecal ulceration  that raised some question of inflammatory bowel disease although markers  have been negative. He now presents with persistent and recurring episodes  and we have elected to proceed with colectomy. Due to the extent of his  disease, we have elected a subtotal colectomy. The nature of the procedure,  its indications, risks and benefits and side effects and possible  complications including bleeding, infection, anastomotic leak were discussed  and understood. He is now brought to the operating room for this procedure.   DESCRIPTION OF PROCEDURE:  Following mechanical antibiotic bowel prep at  home, the patient was brought to the operating room, placed in supine  position on the operating table and general endotracheal anesthesia was  induced. He received broad spectrum preoperative antibiotics. A Foley  catheter and oral  gastric tube were placed. The abdomen was widely sterilely  prepped and draped. A midline incision skirting the umbilicus was used and  dissection carried down through the midline fascia and the peritoneum  entered under direct vision. A thorough exploration was then performed.  There were fairly marked diverticular changes and thickening of the sigmoid  colon. The remainder of the colon showed some scattered diverticula and  possibly some thickening along the more proximal left colon but no major  abnormalities. The stomach, duodenum, gallbladder were normal. The  retroperitoneum was unremarkable. The small bowel was negative. The liver  and spleen were unremarkable. Retractors were placed and then we proceeded  with mild mobilization of the entire abdomen and colon. Peritoneal  attachments along the terminal ileum were divided mobilizing this. The line  of Toldt was incised with cautery along the right colon and the right colon  mobilized up out of the retroperitoneum. The hepatic flexure was mobilized  dividing the hepatocolic ligament between clamps and tying  with 2-0 silk  ties. There was a nice avascular plane between the omentum and the  transverse colon and this was divided with cautery and the omentum was  preserved completely mobilizing the transverse colon. The point for division  of the rectosigmoid was chosen at the end of the tenia with no diverticula  distal to this. The peritoneum here was scored and the sigmoid colon  mobilized up out of the retroperitoneum dividing lateral peritoneal  attachments. The left ureter was identified and protected. The left colon  was mobilized dividing the line of Toldt. The splenic flexure was mobilized  and divided between clips. Following complete mobilization, the terminal  ileum was divided right up the ileocecal valve with a single firing of the  GIA stapler. The mesentery of the right transverse and left colon was then  sequentially  divided between clamps and tied with 2-0 silk ties and larger  vessels additionally suture ligated with 3-0 silk. Dissection progressed  down to the rectosigmoid to the exposed area of resection which was cleaned  of pericolic fat and divided with the GIA stapler. Following this, a  functional end-to-end anastomosis was created between the terminal ileum and  the rectosigmoid with a single firing of the GIA stapler. The common  enterotomy was closed in two layers with running 3-0 Chromic in interrupted  serial muscular 3-0 silk sutures. The anastomosis was under no tension and  appeared widely patent with good blood supply. Following this, the abdomen  was thoroughly irrigated and gloves and instruments changed. Hemostasis was  assured. The mesentery of the terminal ileum was sutured down to the  peritoneum closing the mesenteric defect. The viscera were returned to their  anatomic position. The midline fascia was closed with running #1 PDS to  either end of the of the incision and tied centrally. The subcutaneous  tissue was irrigated with antibiotic solution and skin closed with staples.  Sponge, needle and instrument counts were correct. Dry sterile dressings  were applied and the patient taken to the recovery room in good condition.                                               Darene Lamer. Hoxworth, M.D.    [place]  D:  08/31/2002  T:  08/31/2002  Job:  [id number]

## 2011-01-11 NOTE — Consult Note (Signed)
NAME:  [name], BARREN NO.:  [id number]   MEDICAL RECORD NO.:  [id number]          PATIENT TYPE:  INP   LOCATION:  4970                         FACILITY:  [facility]   PHYSICIAN:  Larey Seat, M.D.  DATE OF BIRTH:  [birth year]   DATE OF CONSULTATION:  11/12/2006  DATE OF DISCHARGE:                                 CONSULTATION   This is a 65 year old right-handed Caucasian male admitted to the  [facility] Team on November 09, 2006.  Based on the H&P, [name] was  admitted with altered mental status and acute hallucination, increased  somnolence, and disorientation.  He has a history of obstructive sleep apnea that has not always been  treated.  The patient also has not always been compliant in the past.  He  currently has a CPAP machine here at the bedside.  He does not know his home settings.  He was seen in the Urgent Care Clinic with profound diarrhea before he  came to the hospital.  He had low blood pressures and given large amount of IV fluids.   He has a history of Crohn's disease, CABG, atrial flutter, OSA, seizure  disorder which has actually according to his neurology records never  been proven, hyperlipidemia, depression, and he has developed recently  acute renal failure after diarrhea and dehydration.  There is a history of alcohol abuse, but the patient states that he has  been sober for at least 6 weeks, and he is very offended when I suggest  that he might have had a withdrawal reaction.  He had a history of  prescription medication drug abuse 3 years ago.   The patient is married.  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  He has had no tobacco use.  No alcohol use since February.  HE HAS DRUG ALLERGY TO KEFLEX.   MEDICATIONS:  1. Ativan at an unknown dose.  2. Ambien 10 mg at night.  3. Keppra 750 mg b.i.d.  4. Aricept 10 mg q.h.s.  5. Provigil 200 mg daily.  6. Zetia 10 mg daily.  7.  Lexapro 10 mg daily.  8. Zocor.  9. Corgard.  10.Albuterol.  The patient denies any fevers, chills, changes in weight, anorexia,  abdominal pain, headaches, nausea, vomiting, melena and no recent  syncope or possible seizure spells.   PHYSICAL EXAMINATION:  VITAL SIGNS:  The patient is on physical  examination well developed, pleasant, cooperative.  Temperature is 98.3,  pulse is 80, blood pressure today is 115/80.  Respiratory rate is 16.  O2 saturation is 96% on room air.  The patient is in no acute distress,  sitting relaxed in an armchair.  HEENT:  The patient is atraumatic, normocephalic with Mallampati grade  B.  ABDOMEN:  No abdominal tenderness.  HEART:  Regular rate and rhythm of the heart.  No murmur.  No carotid  bruits.   NEUROLOGIC ASSESSMENT:  The altered mental status I cannot confirm,  although the patient himself tells me that last night he has very angry  and upset being accused of still drinking.  He acknowledged that he might have an anger management problem.  He also states that when he gets sleep deprived, he can be disoriented  from that.  He is fluent in his speech.  There is no dysarthria.  Pupils  are reactive to light and accommodation.  Full extraocular movements.  No nystagmus.  Equal arm and leg strength in all motor maneuvres 1+  tendon reflexes, downgoing toes, no Tremor in lower extremities , no  clonus, no disequilibrium on gait,  mild tremor with finger-nose test, but no pronator drift.   The patient on a mini-mental test scored 27 out of 30 points.  He was  able to do serial 7's until he made 1 error in the 4th step.Marland Kitchen  He could spell a word backwards.  He is fully oriented to date, time,  and place.   ASSESSMENT:  This patient's mental status seems at this time not to  warrant a delirium workup.  I would like to consider that he might have  been sundowning last night.  I also would consider psychiatry consult.  There is no neurologic new  deficit.  The patient is already on Aricept, and has been given CPAP, and Provigil  for residual daytime somnolence.  I would consider changing this patient from his current sleeping  medication, Ambien, to trazodone 50 mg at night, as it is less likely to  cause any hallucinations.   The patient states that he is a neurologic followup care with a  neurologist in [facility].  He is no longer an active patient at  [facility] /4 years.      Larey Seat, M.D.  Electronically Signed     CD/MEDQ  D:  11/12/2006  T:  11/12/2006  Job:  [id number]

## 2011-01-11 NOTE — Discharge Summary (Signed)
NAME:  [name], [name]                           ACCOUNT NO.:  [id number]   MEDICAL RECORD NO.:  [id number]                   PATIENT TYPE:  INP   LOCATION:  6712                                 FACILITY:  [facility]   PHYSICIAN:  Jacob Kitchen T. Sharp, M.D.          DATE OF BIRTH:  August 14, 1946   DATE OF ADMISSION:  08/31/2002  DATE OF DISCHARGE:  09/06/2002                                 DISCHARGE SUMMARY   DISCHARGE DIAGNOSES:  Diverticulitis.   OPERATIONS/PROCEDURES:  Subtotal colectomy August 31, 2002.   HISTORY OF PRESENT ILLNESS:  The patient is a 65 year old [name] male with a  fairly long history of abdominal pain.  He has had both right and left-sided  abdominal pain on a number of occasions over the last several years.  This  has been increasing in severity.  June of this year he had a more severe  episode of diffuse abdominal pain and elevated Kopf count.  CT scan at that  time revealed diffuse diverticulosis and inflammatory change and stranding  involving both the sigmoid colon and transverse colon.  He was treated with  antibiotics and improved.  He, however, has had continued left lower  quadrant to right mid abdomen and right upper quadrant abdominal pain.  He  has had a colonoscopy showing evidence of persistent sigmoid diverticulitis.  He has also had some evidence of mild ulceration of the right colon felt  possibly secondary to end-stage but inflammatory bowel disease could not be  ruled out.  Due to worsening and persistent symptoms and definite  documentation of diverticulitis as well as pan diverticulosis, after  discussion in the office and with [name]. [name], M.D. [facility], we have  elected to proceed with subtotal colectomy and he is admitted for this  procedure.   PAST MEDICAL HISTORY:  1. He has had appendectomy.  2. Cervical fusion.  3. Several inguinal hernia repairs including orchiectomy for undescended     testes.  4. Treated for hypertension.  5.  Hypercholesterolemia.  6. Followed by [name], M.D. for erectile dysfunction.   CURRENT MEDICATIONS:  1. Prinivil 20 mg daily.  2. Corgard 80 mg b.i.d.  3. Zocor 80 mg daily.  4. Ativan 1 mg t.i.d.   ALLERGIES:  DOXYCYCLINE and TALWIN.   SOCIAL HISTORY:  See detailed H&P.   FAMILY HISTORY:  See detailed H&P.   REVIEW OF SYSTEMS:  See detailed H&P.   PHYSICAL EXAMINATION:  GENERAL:  He is a well-developed Mccrory male in no  acute distress.  ABDOMEN:  Pertinent findings were limited to abdomen which revealed mild to  moderate diffuse abdominal tenderness without palpable masses or  hepatosplenomegaly.   HOSPITAL COURSE:  Following mechanical antibiotic bowel prep at home the  patient was admitted on the morning of this procedure and underwent a  subtotal abdominal colectomy.  There were definite changes in chronic  inflammation along the left colon.  He tolerated procedure well.  Initially,  he had some low grade fever that responded to pulmonary toilet.  Clear  liquid diet was begun on the second postoperative day which he tolerated  well and diet was able to be gradually advanced to a regular diet.  He began  having some loose bowel movements by the fourth postoperative day.  Parenteral narcotics were gradually weaned and he was switched over to oral  pain medication.  By the sixth postoperative day he was having only mild  discomfort.  He was tolerating a regular diet and bowels were moving  regularly.  Wound was healing primarily without infection.  Final pathology  revealed evidence of diverticulitis involving chiefly the sigmoid and left  colon.   DISCHARGE MEDICATIONS:  1. Prinivil 20 mg daily.  2. Corgard 80 mg b.i.d.  3. Zocor 80 mg daily.  4. Ativan 1 mg t.i.d.  5. Percocet as needed for pain.   ACTIVITY:  Activity limitations were discussed.   FOLLOW UP:  To be in my office in two weeks.                                               Jacob Lamer.  Sharp, M.D.    [place]  D:  09/21/2002  T:  09/21/2002  Job:  [id number]   cc:   [name]. [name], M.D. [facility]  [address] [address]  Fax: 1   [name], M.D.  [address]  [place]  [place] [address]  Fax: [phone]

## 2011-01-11 NOTE — Op Note (Signed)
NAME:  KAHLE, MCQUEEN                           ACCOUNT NO.:  [id number]   MEDICAL RECORD NO.:  [id number]                   PATIENT TYPE:  INP   LOCATION:  2315                                 FACILITY:  [facility]   PHYSICIAN:  Ivin Poot III, M.D.           DATE OF BIRTH:  [birth year]   DATE OF PROCEDURE:  12/21/2003  DATE OF DISCHARGE:                                 OPERATIVE REPORT   OPERATION:  Coronary artery bypass grafting x 4 (left internal mammary  artery to the left anterior descending, saphenous vein graft to diagonal,  saphenous vein graft circumflex marginal, saphenous vein graft to right  coronary artery).   PREOPERATIVE DIAGNOSIS:  Class IV unstable angina with severe three vessel  coronary artery disease.   POSTOPERATIVE DIAGNOSIS:  Class IV unstable angina with severe three vessel  coronary artery disease.   SURGEON:  Ivin Poot, M.D.   ASSISTANT:  John Giovanni, P.A.-C.   ANESTHESIA:  General by Dr. Lorrene Reid   INDICATIONS FOR PROCEDURE:  The patient is a 65 year old male with symptoms  of chest pain, shortness of breath, and presyncope over the past two months.  A stress test showed anterior ischemia and he underwent cardiac  catheterization by Dr. Peter Martinique.  This demonstrated occlusion of the LAD  and diagonal, high grade stenosis of the circumflex, and high grade stenosis  of the right coronary artery with ejection fraction of 55%.  He was felt to  be a candidate for surgical coronary revascularization.  Prior to surgery I  examined the patient in his hospital room and reviewed the results of  cardiac cath with the patient and family.  I discussed the indications a  well as the risks of MI, CVA, bleeding, blood transfusion requirement,  infection, and death.  He understood alternatives to surgical therapy for  treatment of his coronary artery disease.  He agreed to proceed with the  operation as planned under what I felt was an informed  consent.   OPERATIVE FINDINGS:  The saphenous vein was harvested from the right thigh  using the endoscopic technique.  The mammary artery was a good conduit and  the veins were slightly dilated and thin walled.  The coronaries had severe,  calcified disease.  The LAD was chronically occluded and a suboptimal  target.  The circumflex margin and right coronary were adequate targets.  There is no focal area of myocardial scarring or fibrosis.  No blood  products were used during the operation.   PROCEDURE:  The patient was brought to the operating room and placed supine  on the operating table.  General endotracheal anesthesia was induced under  invasive hemodynamic monitoring.  The chest, abdomen, and legs were prepped  with Betadine and draped as a sterile field.  A sternal incision was made as  the saphenous vein was harvested from the right thigh using endoscopic  technique.  The left IMA was harvested as a pedicle graft from its origin of  the subclavian vessels and was a good vessel with excellent flow.  Heparin  was administered and the ACT was documented as being therapeutic.  The  sternal retractor was placed and the pericardium was opened. Through purse-  strings placed in the ascending aorta and right atrium, the patient was  cannulated and placed on bypass.  The coronaries were identified for  grafting.  Cardioplegia cannulae were placed for both antegrade aortic and  retrograde coronary sinus cardioplegia.  The patient was cooled to 30  degrees.  The aortic Cross clamp was applied.  800 mL of cold blood  cardioplegia was delivered in split doses between the antegrade aortic and  retrograde coronary sinus catheters.  There was good cardioplegic arrest  with septal temperature dropping to less than 12 degrees.  Topical iced  saline slush was used to augment myocardial preservation and the septal  temperature was monitored with a Thermistor probe.   The distal coronary  anastomoses were then performed.  The first distal  anastomosis was to the right coronary.  This was a 1.8 mm vessel with  proximal 80-90% stenosis.  A reversed saphenous vein was sewn end-to-side  with running 7-0 Prolene with good flow to the graft.  Cardioplegia was  dosed to the graft.  The second distal anastomosis was the first diagonal.  This was a 1.5 mm vessel with a proximal 90% stenosis.  A reversed saphenous  vein was sewn end-to-side with running 7-0 Prolene with good flow through  the graft.  The third distal anastomosis was the obtuse marginal.  This was  a large 1.8 to 2 mm vessel with a proximal 90% stenosis.  A reversed  saphenous vein was sewn end-to-side with running 7-0 Prolene with good flow  through the graft.  The cardioplegia was redosed.  The fourth distal  anastomosis was the distal third of the LAD.  It was heavily calcified and  not graftable proximally.  The left IMA pedicle was brought through an  opening created in the left lateral pericardium and was brought down onto  the LAD and sewn end-to-side with running 8-0 Prolene.  There was diffuse  calcified disease, as well, distal to the anastomosis, but a 1 to 1.5 mm  probe passed distally.  There was good increase in septal temperature after  releasing the pedicle clamp of the mammary artery.  The mammary pedicle was  secured to the epicardium and the aortic Cross clamp was removed.   The heart resumed a spontaneous rhythm.  Three proximal anastomoses were  placed on the aorta using a partial occluding clamp.  The clam was removed,  the vein grafts were perfused, and there was good flow through all grafts  and hemostasis was documented at the proximal and distal sites.  The patient  was rewarmed and reperfused.  Temporary pacing wires were applied.  The  lungs were expanded and the ventilator resumed.  The patient was weaned from bypass without difficulty.  Blood pressure and cardiac output were stable.   Protamine was administered without an adverse reaction.  The cannulae were  removed.  The mediastinum was irrigated with warm antibiotic irrigation.  The leg incision was irrigated and closed in a standard fashion.  The  pericardium was loosely reapproximated.  Two mediastinal and left pleural  chest tubes were placed and brought out through separate incisions.  The  sternum was closed with interrupted steel wire.  The pectoralis fascia was  closed with a running #1 Vicryl.  The subcutaneous tissue and skin layers  were closed with running Vicryl and sterile dressings were applied.  Total  bypass time was 135 minutes.  The aortic Cross clamp time was 55 minutes.                                               Len Childs, M.D.    PV/MEDQ  D:  12/21/2003  T:  12/21/2003  Job:  [id number]   cc:   Peter M. Martinique, M.D.  [address] [place], [place] [address]  Fax: [phone]

## 2011-01-11 NOTE — Discharge Summary (Signed)
NAMEMANJOT, HINKS NO.:  [id number]   MEDICAL RECORD NO.:  [id number]          PATIENT TYPE:  INP   LOCATION:  3106                         FACILITY:  [facility]   PHYSICIAN:  Rexene Alberts, M.D.    DATE OF BIRTH:  12-25-1945   DATE OF ADMISSION:  06/04/2005  DATE OF DISCHARGE:  06/06/2005                                 DISCHARGE SUMMARY   DISCHARGE DIAGNOSES:  1.  Altered mental status/confusion.  2.  Seizure disorder.  3.  History of right frontal stroke per MRI of the brain October11,2006.   SECONDARY DISCHARGE DIAGNOSES:  1.  Obstructive sleep apnea. The patient has difficulty tolerating BiPAP at      night.  2.  Coronary artery disease status post coronary artery bypass graft in the      past.  3.  Hypertension.  4.  Hyperlipidemia.  5.  Depression.  6.  Status post partial colectomy in the past.  7.  Status post laminectomy in the past.  8.  Status post hemorrhoid repair in the past.  9.  Status post tonsillectomy in the past.  10. Status post appendectomy in the past.  11. Status post orchiectomy in the past for undescended testicle.   DISCHARGE MEDICATIONS:  1.  Discontinue Remeron.  2.  Prinivil 40 mg daily.  3.  Corgard 120 mg, 1 pill daily for three days and then increase it back to      120 mg b.i.d.  4.  Zocor 80 mg daily.  5.  Keppra 1500 mg b.i.d.  6.  Aspirin 81 mg daily.  7.  Zetia 10 mg daily.  8.  Ativan 1 mg b.i.d. p.r.n.   CONSULTATIONS:  Dr. Wyline Copas.   PROCEDURE PERFORMED:  1.  CT scan of the head without contrast on October10,2006. The results      revealed progressive small vessel ischemic changes in the basal ganglia      with apparent new encephalomalacia in the right frontal lobe. No      definite acute intracranial findings are demonstrated but these changes      are age indeterminate.  2.  MRI of the brain October11,2006. The results revealed mild progression      of atrophy and small-vessel disease since the  prior exam. There has been      an intervening right frontal infarct since the prior exam which      nevertheless appears chronic as well. No abnormal enhancement.  3.  Bilateral carotid Doppler study on October11,2006. The results revealed      no ICA stenosis bilaterally. Vertebral artery flow antegrade.  4.  EEG on October11,2006. The results revealed this routine EEG is abnormal      secondary to rare left frontal/anterior temporal spikes. The spikes are      suggestive of cortical irritation and possible seizure focus.   HISTORY OF PRESENT ILLNESS:  The patient is a 65 year old man with a past  medical history significant for seizures, obstructive sleep apnea, and  coronary artery disease, who presented to the emergency department on  October10,2006  after his wife felt that he was more confused. On the day of  admission, his wife called him on the phone from work and felt that the  patient was giving inappropriate answers. She therefore brought him to the  emergency department because he did not look right.   HOSPITAL COURSE:  1.  Altered mental status/seizure disorder/radiographic evidence of a old      right frontal stroke. The patient was hemodynamically stable at the time      of hospital admission. His neurological exam was apparently within      normal limits on admission. However, for further evaluation of his      history of confusion, a number of investigational studies were ordered.      The results are as follows:  CT scan of the head without contrast      revealed progressive small vessel ischemic changes in the basal ganglia      with apparent new encephalomalacia in the right frontal lobe;  urine      drug screen was only positive for benzodiazepines;serum alcohol level      was less than 5;cardiac markers x3 were completely normal; and an ABG on      room air revealed a pH of 7.397, PCO2 of 44, and PO2 of 72.4. The      patient's TSH was within normal limits at 1.969.  His CBC was      unremarkable. His BMET was within normal limits.   Per the patient's history, he was recently started on a new antidepressant  medication, Remeron. Since starting on Remeron, his wife has noticed that he  had become increasingly confused, restless and had more difficulty sleeping.  The Remeron was discontinued at the time of hospital discharge. Neurologist,  Dr. Gaynell Face, was consulted for further evaluation and management. He  recommended a MRI of the brain, EEG, and agreed with discontinuance of  Remeron. The MRI of the brain revealed mild progression of atrophy and  ischemic changes. It also revealed an interval new right frontal infarct  compared with the MRI of the brain in [place]. There was, however, no  evidence of acute stroke. The EEG revealed changes consistent with possible  irritation and seizure focus. A carotid Doppler study was also ordered and  revealed no evidence of ICA stenosis.   The patient was maintained on Keppra during the hospital course. He  demonstrated no confusion during the hospital course. There was no reported  seizure activity during the entire hospital course. At the time of hospital  discharge, the patient was alert and oriented. The exact etiology of the  patient's confusion is uncertain.  However, more than likely the patient may  have been postictal from a nonwitnessed seizure, coupled with side effects  from Remeron. The patient has a primary neurologist, Dr. Ermalene Postin, in [facility]. He was advised to follow up with Dr. Ermalene Postin in 3-5 days following  hospital discharge.   1.  Coronary artery disease/hypertension/hyperlipidemia. The patient      remained hemodynamically stable during the hospital course. As stated      above, his cardiac markers were within normal limits. The patient's      blood pressures were low normal and therefore Corgard was temporarily      changed to q.d. rather than b.i.d. The     patient was maintained  on Prinivil, Zocor, Zetia and aspirin during the      hospital course. A fasting lipid panel revealed  a total cholesterol of      144, triglycerides of 352, LDL cholesterol 37, and HDL cholesterol of      37. Further management per his primary care physician.      Rexene Alberts, M.D.  Electronically Signed     DF/MEDQ  D:  06/08/2005  T:  06/08/2005  Job:  [id number]   cc:   Peter M. Martinique, M.D.  Fax: [phone]

## 2011-01-11 NOTE — Discharge Summary (Signed)
NAMEARIO, MCDIARMID NO.:  [id number]   MEDICAL RECORD NO.:  [id number]          PATIENT TYPE:  INP   LOCATION:  4782                         FACILITY:  [facility]   PHYSICIAN:  Talbert Cage, M.D.DATE OF BIRTH:  25-Mar-1946   DATE OF ADMISSION:  09/25/2006  DATE OF DISCHARGE:  09/28/2006                               DISCHARGE SUMMARY   PRIMARY CARE PHYSICIAN:  Dr. [name], [place].   IMAGING:  1. Renal ultrasound on February 1 showed nonspecific curvilinear 1.6 x      0.5 cm echogenic structure inferior aspect of the left kidney with      suggestion of a minimal amount of adjacent perinephric fluid,      etiology undetermined.  Infection could not be excluded.  This      needs to be readdressed on followup.  No hydronephrosis was seen.  2. Chest x-ray showed cardiomegaly with mild vascular congestion but      no acute abnormality.   CONSULTATIONS:  None.   PROCEDURES:  None.   DISCHARGE DIAGNOSES:  1. Viral gastroenteritis.  2. Dehydration, resolved.  3. Acute renal failure, resolved.  4. Seizure disorder, stable.  5. Hyperlipidemia.  6. Cardiovascular disease.  7. Depression.   DISCHARGE MEDICATIONS:  1. Keppra 750 mg p.o. b.i.d., to be increased tomorrow if creatinine      continues to be normal.  2. Aricept 10 mg p.o. daily.  3. Provigil 200 mg p.o. daily.  4. Zetia 10 mg p.o. daily.  5. Ambien 10 mg p.o. nightly.  6. Lexapro 10 mg p.o. daily.  7. Zocor 40 mg p.o. daily.  8. The patient is to hold Corgard and Prinivil until he follows up      with his primary care physician.   FOLLOW UP:  The patient is to have his BMET and blood pressure checked  at [facility] on Monday, September 30, 2006.  If his creatinine  continues to be stable, he has been instructed to increase Keppra to his  home dose of 1500 mg p.o. b.i.d. He will also need to hold the blood  pressure medication until he can follow up with Dr. [name].   LABORATORY  DATA:  The patient's CBC was stable except for an elevate  Pask count on admission of  10.8 but decreased to 9.7 on discharge.  Hemoglobin 14.0, hematocrit 40.0, platelets 206.  Chemistries on  admission were significant for a chloride of 114, bicarb 14, BUN 57,  creatinine 4.3 with an estimated GFR of 14.  On discharge, the  chemistries had normalized except for a bicarb of 17.  His creatinine  was 1.23 and GFR greater than 60.  LFTs were normal except for an AST of  57 and a calcium of 6.9 on admission and was 8.4 by discharge. BNP on  admission was 89.  TSH was 0.633.  Stool cultures from January 31: No  growth to date by discharge.  Blood cultures were no growth to date.  C.  difficile toxin negative x1.   HOSPITAL COURSE:  This is a 65 year old  [place] male with a known seizure  disorder and status post non-diverting hemicolectomy in 2003 for  diverticulitis who was admitted after a syncopal episode with  dehydration, hypotension, and acute renal failure after an episode of  persistent nausea, vomiting, and diarrhea presumed to be viral  gastroenteritis.  On admission, his creatinine was 4.6, BUN 58, with a  GFR of 13.  He was hydrated aggressively, and the ACE inhibitor was held  along with his home medications being dose renally including Keppra.  A  renal ultrasound showed nonspecific echogenic structure that will need  to be followed up by his primary care physician but not urgent for the  cause of this current bout of acute renal failure. Stool studies were  negative, and the patient's diarrhea improved and spread out to q. 2 h.  and decreased in volume as compared to his baseline diarrhea of q. 4 h.  secondary to his hemicolectomy.  He was able to maintain p.o. hydration,  and creatinine had normalized by the day of discharge.  The patient was  discharged home on half his Keppra dose and instructed not to drive  until cleared by Dr. [name].  He will follow up at [facility] tomorrow  to  ensure he is maintaining his hydration status and his creatinine  continues to be stable at which point his Keppra dose should be  increased to his normal dose.  The patient did not have any issues  regarding his other medical problems during this admission.   FOLLOWUP ISSUES:  1. Creatinine.  2. His Keppra dose needs to be put back to his normal dose after a      creatinine check.  3. Check blood pressure and reinstitute blood pressure medications as      tolerated.  4. Renal ultrasound needs to be followed up for the echogenic      structure.     ______________________________  Mayra Neer, M.D.    ______________________________  Talbert Cage, M.D.    KS/MEDQ  D:  09/28/2006  T:  09/28/2006  Job:  [id number]   cc:   Dr. Daub, [place]

## 2011-01-11 NOTE — H&P (Signed)
NAME:  [name], [name]                           ACCOUNT NO.:  [id number]   MEDICAL RECORD NO.:  [id number]                   PATIENT TYPE:  OIB   LOCATION:                                       FACILITY:  [facility]   PHYSICIAN:  Peter M. Martinique, M.D.               DATE OF BIRTH:  06/25/1946   DATE OF ADMISSION:  12/20/2003  DATE OF DISCHARGE:                                HISTORY & PHYSICAL   HISTORY OF PRESENT ILLNESS:  The patient is a 65 year old [name] male, being  evaluated for an abnormal electrocardiogram.  The patient has cardiac risk  factors of hypertension and hypercholesterolemia.  He has a history of  epileptic seizures, and recently experienced an episode when working in his  yard where he became red in his face and arms.  His wife was concerned that  he was going to have a seizure, so [facility].  At  that time an electrocardiogram showed a question of an anterior infarction,  age undetermined.  The patient denied any history of coronary artery  disease.  He does get some mild chest pain when he goes up and down stairs,  but this really has not limited him in any way.  He denies any dyspnea.  To  further evaluate, he underwent a stress Cardiolite study on December 16, 2003.  He had an excellent exercise tolerance, walking for 10 minutes on the Bruce  protocol.  He developed mild dyspnea and had 1 mm of ST depression  inferiorly.  His subsequent Cardiolite images demonstrated moderate to  severe ischemia involving the anterior apical region and distal inferior  wall.  His left ventricular function was normal at 53%.  Based on the marked  abnormality of his Cardiolite study, it was recommended he undergo a cardiac  catheterization.   PAST MEDICAL HISTORY:  1. Hypertension.  2. Hypercholesterolemia.  3. History of diverticulitis.  4. History of umbilical hernia repair.  5. Status post appendectomy.  6. Status post cervical fusion.  7. History of colon  resection.  8. History of seizure disorder.   ALLERGIES:  TALWIN.   CURRENT MEDICATIONS:  1. Corgard 120 mg daily.  2. Keppra 1500 mg b.i.d.  3. Lisinopril 20 mg daily.  4. Depo-testosterone 200 mg injection monthly.  5. Ativan 1 mg t.i.d.   SOCIAL HISTORY:  The patient manages [employer].  He is  married.  He has one child.  One child passed away with brain cancer.  He  denies tobacco or alcohol use.   FAMILY HISTORY:  His father died at age 4 with a thoracic aneurysm.  His  mother died of old age and hypertension.  One brother has hypertension and  high cholesterol.   REVIEW OF SYSTEMS:  The patient does report a history of sleep apnea.  He  has been tried on CPAP therapy.  All other review of systems are negative.   PHYSICAL EXAMINATION:  GENERAL:  The patient is a pleasant [place] male, in no  distress.  VITAL SIGNS:  Weight 180 pounds, blood pressure 122/80, pulse 80 and  regular.  HEENT:  Pupils equal, round, reactive to light and accommodation.  Extraocular movements are full.  Oropharynx clear.  NECK:  He has no jugular venous distention, adenopathy, or thyromegaly, no  bruits.  LUNGS:  Clear.  HEART:  A regular rate and rhythm.  Normal S1, S2, without gallops, murmurs,  rubs, or clicks.  ABDOMEN:  Soft, nontender.  EXTREMITIES:  He has no edema.  NEUROLOGIC:  Nonfocal.   LABORATORY DATA:  Electrocardiogram shows a normal sinus rhythm.  Cannot  rule out anterior infarction, age undetermined.   IMPRESSION:  1. Abnormal stress Cardiolite study showing extensive anterior apical and     inferior wall ischemia.  2. Hypertension.  3. Hypercholesterolemia.  4. History of seizures.   PLAN:  Will proceed with a cardiac catheterization, with further therapy  pending these results.                                                Peter M. Martinique, M.D.    PMJ/MEDQ  D:  12/19/2003  T:  12/19/2003  Job:  [id number]   cc:   Bernerd Limbo, M.D.  [place]  [place] [address]  Fax: [phone]

## 2011-01-11 NOTE — Cardiovascular Report (Signed)
NAME:  [name], [name]                           ACCOUNT NO.:  [id number]   MEDICAL RECORD NO.:  [id number]                   PATIENT TYPE:  OIB   LOCATION:  3703                                 FACILITY:  [facility]   PHYSICIAN:  Peter M. Martinique, M.D.               DATE OF BIRTH:  Nov 30, 1945   DATE OF PROCEDURE:  12/20/2003  DATE OF DISCHARGE:                              CARDIAC CATHETERIZATION   INDICATION FOR PROCEDURE:  The patient is a 65 year old [place] male with  history of diabetes mellitus type 2, hypertension and hyperlipidemia  presented with recent finding of PVCs.  Subsequent stress test and stress  Cardiolite study demonstrated extensive anterior apical and inferior wall  ischemia.   ACCESS:  Via the right femoral artery using standard Seldinger technique.   EQUIPMENT:  6 French 4 cm right and left Judkins catheter, 6 French pigtail  catheter, 6 French arterial sheath.   PROCEDURE:  1. Left heart catheterization, coronary.  2. Left ventricular angiography.   MEDICATIONS:  Local anesthesia with 1% Xylocaine.   CONTRAST:  120 mL of Omnipaque.   HEMODYNAMIC DATA:  Aortic pressure 131/79 with mean of 98.  Left ventricular  pressure was 118 with EDP of 17 mmHg.   ANGIOGRAPHIC DATA:  The left coronary artery arises and distributes  normally.  The left main coronary artery is short and has 30% distal  narrowing.   The left anterior descending artery is a heavily calcified vessel.  It is  occluded in the mid vessel.  The first diagonal branch has 60-705 stenosis  proximally.  The LAD fills well by left-to-left collaterals.   The left circumflex coronary artery is a large vessel which has an 80%  ostial stenosis.   The right coronary artery appears to be a co-dominant vessel.  It has a  shepherd's crook deformity proximally.  In the mid vessel there is a long  segment of disease with 90% stenoses.   LEFT VENTRICULAR ANGIOGRAPHY:  Performed in the RAO view demonstrates  normal  left ventricular size with minimal apical hypokinesia.  Otherwise, normal  left ventricular function with ejection fraction estimated at 60%.  There is  no mitral regurgitation or prolapse.   FINAL INTERPRETATION:  1. Severe three-vessel obstructive atherosclerotic coronary artery disease     with high risk anatomy.  2. Overall good left ventricular function.   PLAN:  Recommend coronary artery bypass graft.                                               Peter M. Martinique, M.D.    PMJ/MEDQ  D:  12/20/2003  T:  12/20/2003  Job:  [id number]   cc:   Ermalene Searing. Philip Aspen, M.D.  [address]  [place]  [place] [address]  Fax: [phone]

## 2011-01-11 NOTE — Procedures (Signed)
HISTORY:  This is a 65 year old having an EEG to evaluation for seizures.   CURRENT MEDICATIONS:  Keppra.   PROCEDURE:  Portable EEG.   TECHNICAL DESCRIPTION:  Throughout this routine EEG there is a posterior  gamma rhythm of 9-10 Hertz activity at 20-30 microvolts.  The background  activity is symmetric and mostly comprised of alpha range activity at 15-30  microvolts.  On a few occasions, there are rare spikes noted with phase  reversals at F7 and T3.  Photostimulation nor hyperventilation were  performed throughout this recording.  The patient did not go to sleep during  this tracing.   IMPRESSION:  This routine EEG is abnormal secondary to rare left  frontal/anterior temporal spikes.  These spikes are suggestive of cortical  irritation and possible seizure focus.  Clinical correlation is advised.           ______________________________  Shaune Pascal. Estella Husk, M.D.     PYY:FRTM  D:  06/05/2005 19:19:57  T:  06/06/2005 00:56:01  Job #:  [id number]

## 2011-01-11 NOTE — Consult Note (Signed)
NAME:  NKOSI, CORTRIGHT NO.:  [id number]   MEDICAL RECORD NO.:  [id number]          PATIENT TYPE:  INP   LOCATION:  3701                         FACILITY:  [facility]   PHYSICIAN:  Jeryl Columbia, M.D.    DATE OF BIRTH:  October 01, 1945   DATE OF CONSULTATION:  DATE OF DISCHARGE:                                 CONSULTATION   ADDENDUM:  CORRECTION:  Mr. Petta's BUN was 21, not 2.1.   Dr. Clarene Essex has seen, examined the patient, and collected a history.  His assessment is that this is a 65 year old male with multiple medical  problems.  From a GI perspective, he has chronic diarrhea, which may be  irritable bowel disease versus viral illness.  It was notable that the  diarrhea worsened when his Xifaxan ended, which indicates that it could  be bacterial overgrowth.  Dr. Watt Climes has ordered stool for WBC and fecal  fat as well as serum sprue antibodies.  He is restarting the Xifaxan at  200 mg p.o. t.i.d.  When Mr. Lecount is stable, he will commence with a  full GI workup, including an upper GI series with small-bowel follow  through.      Melton Alar, PA    ______________________________  Jeryl Columbia, M.D.    MLY/MEDQ  D:  09/30/2006  T:  10/01/2006  Job:  [id number]   cc:   Peter M. Martinique, M.D.  Jeryl Columbia, M.D.  Apollos L. Rolla Flatten., M.D.

## 2011-01-11 NOTE — H&P (Signed)
NAMERUBENS, CRANSTON NO.:  [id number]   MEDICAL RECORD NO.:  [id number]          PATIENT TYPE:  INP   LOCATION:  9833                         FACILITY:  [facility]   PHYSICIAN:  Lucy Chris, MD     DATE OF BIRTH:  [birth year]   DATE OF ADMISSION:  11/09/2006  DATE OF DISCHARGE:                              HISTORY & PHYSICAL   PRIMARY CARE PHYSICIAN:  [facility].   CHIEF COMPLAINT:  Diarrhea and confusion.   HISTORY OF PRESENT ILLNESS:  Mr. Sakuma is a 65 year old man who is  status post colectomy for Crohn's disease.  He also has a history of  seizure disorder and recent hospitalization for diarrhea and acute renal  failure.  He was brought to the emergency room by his wife, secondary to  acute onset of altered mental status two evenings prior to admission.  The altered mental status was described as acute hallucinations,  increased somnolence and disorientation.  Of note, he has also had  profuse diarrhea and was seen in the [facility], where he had  low blood pressure and was given large amounts of IV fluids.   PAST MEDICAL HISTORY:  Status post colectomy secondary to Crohn's  disease.  He has had a CABG, atrial flutter, seizure disorder,  hyperlipidemia, depression, recent hospitalization for diarrhea and  acute renal failure, orchiectomy, appendectomy, cervical fusion and  depression, as well as history of alcohol abuse, sober since his  February hospitalization and a history of prescription medication drug  abuse.   SOCIAL HISTORY:  He is married.  He works part-time as a Microbiologist.  No alcohol since his hospitalization in February, and no tobacco use.   DRUG ALLERGIES:  INCLUDE DOXYCYCLINE AND KEFLEX.   MEDICATIONS:  1. Ativan at an unknown dose.  2. Ambien 10 mg q.h.s., although patient often takes 2-3 pills.  He      also taking Benadryl as needed at night to help him sleep, mainly      when he runs out of his other medications, because  he has been      taking more than he is supposed to.  He is on Temazepam at dose      unknown to help with insomnia, in addition to the Ambien.  He is on      Ativan during the day, to help with anxiety disorder, again dose      unknown as was prescribed through the [place] clinic.  3. Keppra 750 mg b.i.d.  4. Aricept 10 mg q.h.s.  5. Provigil 200 mg daily.  6. Zetia 10 mg daily.  7. Lexapro 10 mg q.h.s.  8. Zocor, dose unknown, q.h.s.  9. Corgard 120 mg daily.  10.Albuterol 1-2 puffs q.4-6 h. as needed.  11.Ambien 10 mg q.h.s. p.r.n. insomnia.  12.Zifaxan 200 mg, two tablets p.o. b.i.d.   REVIEW OF SYSTEMS:  Patient and his wife deny any fevers, chills,  changes in weight, anorexia, abdominal pain, nausea, vomiting, melena,  hematochezia, dysuria, cough, shortness of breath, chest pain or  wheezing.   PHYSICAL EXAMINATION:  VITAL  SIGNS:  Temperature 98.3.  His pulse is 86.  Blood pressure is 110/64, respiratory rate of 20, with an O2 saturation  of 97% on room air.  GENERAL:  He is very somnolent but in no acute distress.  HEAD:  Atraumatic.  NECK:  Without any JVD or thyromegaly.  CHEST:  Clear with good air movement, no wheezing even on forced  expiration.  CARDIOVASCULAR:  Regular rate and rhythm with no murmurs, rubs or  gallops.  ABDOMEN:  With a surgical scar midline, soft, nontender with decreased  bowel sounds.  EXTREMITIES:  Without any edema or deformities.  NEUROLOGIC:  Again, he was somnolent but oriented.  He had nystagmus  with lateral gaze, both the left and to the right.  Sensation was  intact.  His strength was approximately 5/5 and symmetric.  Deep tendon  reflexes were 1+ and symmetric.  Cerebellar function was not tested.  Pupils approximately 4-mm bilaterally, equally round, reactive to light.  Cranial nerves otherwise seem to be intact as well.   LABORATORY:  He had a normal Mullane blood cell count at 9.3, with a  slightly increased monocytosis at 16%.   Hemoglobin was 13.2, platelet  count was 298,000.  Sodium was 137, potassium 4.3, chloride 107,  bicarbonate 20, BUN 55, creatinine 2.32, which is up from 0.89 at the  time of discharge, except on February 7th of this year.  Urine drug  screen was positive for benzodiazepine only.  Urinalysis was negative  for any signs of infection.  CT of the head had question of acute  sinusitis but no other acute findings.   ASSESSMENT:  PROBLEM:  1. Altered mental status.  Unclear if this is secondary to medication      overdose versus an infection versus acute renal failure versus un-      witnessed seizures with postictal state.  Given that we do not know      what all he is taking, we are going to hold most of his      medications, except for low-dose benzodiazepines to prevent      withdrawal.  Also, going to check an EKG for any QT prolongation.      Will check a chest x-ray for signs of pneumonia, although he has no      symptoms of upper respiratory infection.  At this time, I am not      going to treat his acute sinusitis, since he is asymptomatic and      the CT scan was somewhat inconclusive.  We are going to hydrate him      fairly aggressively for his acute renal failure and monitor closely      for any seizure-like activity.  2. Diarrhea.  This has been a chronic problem for him, the profuse      nature of his diarrhea, given his acute renal failure and      dehydration is slightly concerning.  Will continue his Rifaxin at      this time, check for C. diff, given recent hospitalization and      antibiotic use and ask Dr. [name] to come by and see him.  If C.      diff is negative, then we will start Imodium or similar      medications.  3. Acute renal failure, most likely secondary to dehydration.  Will      hydrate fairly aggressively and hold any nephrotoxic medicines      including ARB.  4. Prescription medication abuse.  Again, we are going to hold most of     his medications,  except for a very low dose Ativan and let him      wake, and then we need to counsel him on substance abuse and review      options with him, to see if he is interested in treatment or not.  5. Code status discussed with Mr. Franzen and his wife, and they do wish      for him to be full code, unless it appears to be futile.  His wife      is his health care power of attorney, and she is in agreement with      this decision.      Lucy Chris, MD     KK/MEDQ  D:  11/09/2006  T:  11/09/2006  Job:  [id number]

## 2011-01-11 NOTE — Consult Note (Signed)
[name], TANT NO.:  [id number]   MEDICAL RECORD NO.:  [id number]          PATIENT TYPE:  INP   LOCATION:  2903                         FACILITY:  [facility]   PHYSICIAN:  Jeryl Columbia, M.D.    DATE OF BIRTH:  February 29, 1946   DATE OF CONSULTATION:  09/30/2006  DATE OF DISCHARGE:                                 CONSULTATION   REASON FOR CONSULTATION:  Dehydration and diarrhea.   HISTORY OF PRESENT ILLNESS:  This is a 65 year old [place] male with a  history of chronic diarrhea since his colectomy in January 2004. He  began having increased diarrhea and vomiting on last Wednesday evening,  which was January 30.  At first he vomited food, and then he says his  diarrhea increased to 8-10 times per day, awakening him at night.  The  diarrhea was so bad that he lost consciousness two times on Wednesday  night. At the Urgent Care on January 31, his wife reports that all of  his vital signs were so low that they were unable to register on the  equipment at Urgent Care.  He was sent to the ER and subsequently  admitted to [facility] and diagnosed with viral gastroenteritis  and acute renal failure. On admission, he had a BUN of 55 and creatinine  of 4.5.  Over the next several days, he improved and was discharged on  February 3. On Monday, February 4, he went to Urgent Care, who is also  his primary care doctor, Dr. [name] at [facility], for follow-up  labs and was found to be hypothermic and tachycardia. He was sent to  [facility].  His wife is positive for recent flu.  The patient  reports no melena and no hematochezia.  No hematemesis.  He does report  a decrease in appetite and increase in liquid stools.   PAST MEDICAL HISTORY:  Significant for obstructive sleep apnea,  hypertension, hypercholesterolemia, coronary artery disease, and  depression.  He has a history of alcoholism and addiction to  prescription drugs.  He also has a history of  seizures.  The patient  reports that he has had no colonoscopy or extensive radiological GI  workup since his colectomy in 2004.   PAST SURGICAL HISTORY:  Surgeries include coronary artery bypass  grafting, colectomy in 2004, cervical laminectomy, umbilical hernia  repair, bilateral inguinal hernia repair, appendectomy, tonsillectomy,  and a right wrist aneurysm.   REVIEW OF SYSTEMS:  Pertinent for a recent fever of 101, loss of  consciousness, dry hacking cough.   SOCIAL HISTORY:  He lives with his wife.  He denies tobacco use.  He is  an Market researcher.   FAMILY HISTORY:  Negative for GI cancers, no known irritable bowel  disease.   PHYSICAL EXAMINATION:  GENERAL:  He is alert and oriented in no apparent distress.  He has a  dry cough.  VITAL SIGNS:  T-max for the last 24 hours was 101.6, temperature at  approximately noon today was 99.1.  CARDIOVASCULAR SYSTEM:  He has a regular rate and rhythm  with no  murmurs, rubs or gallops appreciated.  RESPIRATORY SYSTEM:  Expiratory wheezing bilaterally.  He also has a dry  hacking cough.  He has no history of pulmonary disorders.  ABDOMEN:  Mildly distended but the patient reports this is normal.  He  has increased bowel sounds.  They are not tympanic.  He is nontender.  EXTREMITIES:  His extremities show no edema.   LABORATORY DATA:  He has a hemoglobin of 11.4, Bown count 7.6,  platelets were 130.  PT is 14.8, INR 1.1. Albumin 3. Troponin 0.48.  Magnesium 1.3.  BUN 2.1, creatinine 1.36. AST 55, ALT 51, alk phos 105,  total bilirubin 1.3.  C. diff x3 was negative on January 31.  He had  blood in his urinalysis.  Chest x-ray is clear.   CURRENT MEDICATIONS:  Keppra, Aricept, Provigil, Zocor, Zetia, Ambien,  and Lexapro.  He also has been on Prinivil and Corgard, but both of  these medications have been on hold for the past week. In late November,  November 27, and again in January, January 8, he was put on [place]ira.   ALLERGIES:   KEFLEX, DOXYCYCLINE, AND CEPHALOSPORINS.      Melton Alar, PA    ______________________________  Jeryl Columbia, M.D.    MLY/MEDQ  D:  09/30/2006  T:  09/30/2006  Job:  [id number]

## 2011-01-11 NOTE — H&P (Signed)
NAME:  [name], [name] NO.:  [id number]   MEDICAL RECORD NO.:  [id number]          PATIENT TYPE:  INP   LOCATION:  6378                         FACILITY:  [facility]   PHYSICIAN:  Broadus John, MD DATE OF BIRTH:  [birth year]   DATE OF ADMISSION:  09/29/2006  DATE OF DISCHARGE:                              HISTORY & PHYSICAL   Kabir Brannock is a 65 year old [place] man, just discharged from the  hospital yesterday, who presented to the emergency department tonight  with atrial flutter.  Since being in the emergency department, he has  converted to sinus rhythm.   The details of his past medical history and recent hospitalization are  well documented in the medical record.  Suffice it to say that he was  admitted with dehydration related to a viral gastroenteritis which  resulted in a acute renal failure.  Over the course of his three-day  hospitalization, both his dehydration and renal failure resolved with  treatment.   The patient began to feel weak today.  He had a slight cough and sight  shortness of breath.  He presented to the emergency department where he  was found to be in atrial flutter with a ventricular rate of 151 beats  per minute.  He was treated with two doses of adenosine which did not  affect his cardiac rhythm.  He was then started on intravenous diltiazem  which ultimately resulted in conversion to sinus rhythm.  The patient's  fatigue has resolved, and he feels completely well at this time.  The  patient has been eating and drinking today and tolerating it without  vomiting or diarrhea.  The patient reports no chest pain, tightness,  heaviness, pressure, or squeezing.  He reported experienced no  palpitations, and he was unaware of a rapid or irregular heart beat.  He  reported no syncope or near syncope.   Please refer to the medical record from the recent hospitalization for  further background medical information.  He has a history of a  seizure  disorder, dyslipidemia, and depression.  He has a history of heavy  alcohol consumption which he stopped approximately three years ago.  He  also has a history of coronary artery disease and is status post  coronary artery bypass surgery.   ALLERGIES:  DOXYCYCLINE and KEFLEX.   MEDICATIONS:  1. Keppra 1550 mg p.o. b.i.d.  2. Aricept 10 mg p.o. daily.  3. Provigil 200 mg p.o. daily.  4. Zetia 10 mg p.o. daily.  5. Ambien 10 mg p.o. at bedtime p.r.n.  6. Lexapro 10 mg p.o. daily.  7. Zocor 40 mg p.o. daily.   SOCIAL HISTORY:  The patient lives with his wife.  He is retired but  works part-time now doing [employer].  The alcohol consumption is  discussed above.  He does not smoke cigarettes.   PAST SURGICAL HISTORY:  1. Coronary artery bypass surgery.  2. Partial colectomy for diverticulitis.  3. Orchiectomy.  4. Umbilical hernia repair.  5. Appendectomy.  6. Cervical fusion.   FAMILY HISTORY:  Father died at age 48  with a ruptured abdominal aortic  aneurysm.  His mother died at age 80 of old age.  She had congestive  heart failure.  He had a daughter who died at age 21 of brain cancer.  One surviving child, a daughter, __________ healthy.   REVIEW OF SYSTEMS:  Has not changed since he was discharged yesterday.   PHYSICAL EXAMINATION:  VITAL SIGNS:  Blood pressure 93/58, pulse 122 and  regular, respirations 22.  Temperature 101.8.  GENERAL:  The patient was an older [place] man in no discomfort.  He was  alert, oriented, appropriate, and responsive.  HEENT:  Normal.  NECK:  Without thyromegaly or adenopathy.  Carotid pulses were palpable  bilaterally and without bruits.  CARDIAC:  Normal S1 and S2.  No S3, S4, murmur, rub, or click.  Cardiac  rhythm is regular.  CHEST:  No chest wall tenderness.  LUNGS:  Clear.  ABDOMEN:  Soft, nontender.  There was no mass, hepatosplenomegaly,  bruit, distention, rebound, guarding, or rigidity.  Bowel sounds were  normal.   RECTAL/GENITAL:  Not performed as they were not pertinent to the reason  for acute care hospitalization.  EXTREMITIES:  Without edema, deviation, or deformity.  Radial and  dorsalis pedis pulses were palpable bilaterally.  NEUROLOGIC:  Brief  screening neurological survey was unremarkable.   LABORATORY DATA:  The initially electrocardiogram demonstrated atrial  flutter with ventricular rate of 151 beats per minute.  A subsequent  electrocardiogram demonstrated sinus tachycardia with a rate of 124,  nonspecific ST-T wave changes were noted.  The chest radiograph  demonstrated no evidence of cardiopulmonary disease.  Panuco count was  2.2 with a hemoglobin of 127 and hematocrit of 36.2.  Potassium was 3,  BUN 24, creatinine 1.4.  Urinalysis demonstrated a large amount of  blood.  The remaining studies were pending at the time of this  dictation.   IMPRESSION:  1. Atrial flutter, resolved.  2. Fever and leukopenia, rule out sepsis.  3. Hypotension, rule out sepsis, rule out dehydration.  4. Coronary artery disease, status post coronary artery bypass      surgery.  5. Dyslipidemia.  6. Seizure disorder.  7. Depression.   PLAN:  1. Telemetry.  2. Serial cardiac enzymes.  3. Echocardiogram.  4. Thyroid function tests.  5. Blood cultures.  6. Urine culture and sensitivity.  7. IV fluids.  8. Further measures per Dr. Martinique.      Broadus John, MD  Electronically Signed     MSC/MEDQ  D:  09/30/2006  T:  09/30/2006  Job:  [id number]   cc:   Peter M. Martinique, M.D.

## 2011-01-11 NOTE — Consult Note (Signed)
NAMEDAELON, DUNIVAN NO.:  [id number]   MEDICAL RECORD NO.:  [id number]          PATIENT TYPE:  INP   LOCATION:  5511                         FACILITY:  [facility]   PHYSICIAN:  Princess Bruins. Hickling, M.D.DATE OF BIRTH:  [birth year]   DATE OF CONSULTATION:  06/04/2005  DATE OF DISCHARGE:                                   CONSULTATION   CHIEF COMPLAINT:  Confusion.   HISTORY OF THE PRESENT CONDITION:  Mr. Milnes is a 65 year old, right-handed  married gentleman who works in [employer] and [employer].  The patient  has a known seizure disorder and has been followed for a number of years,  first in [place] and then more recently in [facility].  Today, on several  occasions, his wife called him and felt that he was giving inappropriate  answers over the phone.  As a result of this, she brought him to the  hospital.  She also felt that his gait did not look right.   The patient has, in addition to longstanding seizure disorder, a history of  migraines and tension type headaches, cervical spondylosis, atherosclerotic  cardiovascular disease, hypertension, dyslipidemia, depression, anxiety, and  cervical spondylosis, among other complaints.   PAST SURGICAL HISTORY:  1.  Colectomy.  2.  Coronary artery bypass graft.  3.  Laminectomy.  4.  Herniorrhaphy.  5.  Tonsillectomy.  6.  Appendectomy.  7.  Orchiectomy (for undescended testicle).   CURRENT MEDICATIONS:  1.  Prinivil 40 mg per day.  2.  Corgard 120 mg per day.  3.  Keppra 750 twice daily.  4.  Zocor 80 mg per day.  5.  Zetia 10 mg per day.  6.  Ativan 1 mg as needed.   DRUG ALLERGIES:  1.  DOXYCYCLINE.  2.  KEFLEX.  3.  POLLEN.   REVIEW OF SYSTEMS:  The patient had an episode 1 week ago of altered vision  and did not feel right.  He was said to have had a witnessed seizure, but  his wife does not believe that he had a seizure.  He came on home, was  brought to urgent care, and then sent to [facility], where he was evaluated  and no specific treatment given.  The patient says he has frequent headaches  and indeed had a left frontal headache this morning that he said was severe.  Most of his headaches are parietooccipital and I suspect are cervicogenic  and/or tension type in nature.  He denies fever, runny nose, cough, nausea,  vomiting.  He does have loose stools as a result of his colectomy.  No  hematemesis or melena.  No rash, arthritis.  He does have low back pain. No  diabetes or thyroid disease.  No environmental allergies.  NEUROPSYCHIATRIC:  The patient has significant depression.  Some of this is as the result of  life circumstances.  His daughter died of lymphoma at age 98 three years  ago, and he 31 her for about a month every July.  However, he has been  having difficulties with his job and  did not get a promotion that he thought  he would get because of health problems and has remained depressed.  Recently, Remeron 30 mg per day was added at nighttime for depression and  also to help him rest.  He has only been taking the medicine for a few days.   The patient also has had a problem with sleep apnea. This was diagnosed at  the [facility].  He has not been able to feel comfortable with  the CPAP machine on his face, so has been noncompliant with its use.   SOCIAL HISTORY:  The patient denies the use of tobacco or alcohol.  He also  denies the use of drugs.  He has one other living child who lives in  [place].  He lives with his wife.   EXAMINATION:  GENERAL:  This is a pleasant man, somewhat sleepy,  occasionally confused, who is able to give some history and follow commands  and speak in complete sentences.  VITAL SIGNS:  Temperature 97.1; blood pressure 119/73; resting pulse 80;  respirations 18; oxygen saturation 94%.  ENT:  No signs of infection.  NECK:  Supple.  No bruits.  LUNGS:  Clear.  HEART:  No murmurs.  PULSES:  Normal.  ABDOMEN:   Soft, protuberant.  Bowel sounds normal.  EXTREMITIES:  No edema.  He does have scars on his limbs, one from a bony  aneurysm in the right wrist that was removed years ago.  NEUROLOGIC:  The patient was drowsy.  He thought he was in a clinic.  He  knew he was in [place], that it was October, fall, 2006.  When asked who  the president was, he smiled and said, Which Iona Beard?  He names objects,  follows commands, and repeats phrases.  Cranial nerves:  Round, reactive  pupils.  Visual fields full.  Extraocular movements appear to be full.  He  had a little difficulty moving the left eye laterally initially, but seemed  to do well on the second attempt.  There may be a mild esotropia.  I do not  think there is a sixth nerve.  Symmetric facial strength and sensation.  No  nystagmus.  He has midline tongue and uvula.  Air conduction greater than  bone conduction bilaterally, although he does have decreased auditory  acuity.  Motor examination:  Strength was intact.  Good fine motor  movements.  No pronator drift.  Sensation intact to cold, vibration,  stereognosis.  Cerebellar examination:  Good finger-to-nose, rapid  alternating movements.  Gait slightly broad based.  He was able to tandem,  however.  He had a negative Romberg, although he was shaky.  Deep tendon  reflexes were diminished everywhere except the ankles, where they were  absent.  He had bilateral flexor plantar responses.   IMPRESSION:  1.  Confusional state.  He may be having complex partial seizures, but none      have been witnessed.  I suspect Remeron is a major issue here.  Also,      possibly sleep deprivation from his sleep apnea.  2.  Right frontal lesion on CT scan.  This looks to me like an intra-axial      lesion extending to the surface.  It has been read as encephalomalacia,      but I worry about the possibility of a low-grade glioma versus an      infarction. 3.  The patient does have bilateral fairly large  lacunar infarctions  of the      anterior limbs of both internal capsules, and also remote (but new since      June 2005) lesion in the left head of the caudate that is ischemic.      Risk factors for stroke include hypertension, dyslipidemia.  In      addition, the patient has depression and anxiety secondarily to      underlying seizure disorder, cervical spondylosis, and both tension type      and migraine headaches.   PLAN:  1.  MRI of the brain without and with contrast to further characterize the      right frontal  lesion.  2.  EEG, to look at his seizure disorder.  3.  Continue Keppra.  4.  Discontinue Remeron.  I have had an opportunity to discuss this with Dr.      Olivia Mackie, who is admitting the patient.  We will work in concert      with [facility] and turn the patient's care over to Dr. Ermalene Postin at an      appropriate time.  If we find evidence of an intra-axial lesion, will      ask Dr. Karie Chimera to see the patient.  He has performed operations      both on the patient and his wife.      Princess Bruins. Gaynell Face, M.D.  Electronically Signed     WHH/MEDQ  D:  06/04/2005  T:  06/05/2005  Job:  [id number]   cc:   Delman Kitten, M.D.  [place]

## 2011-01-11 NOTE — Discharge Summary (Signed)
NAMEEMRIK, [name]                           ACCOUNT NO.:  [id number]   MEDICAL RECORD NO.:  [id number]                   PATIENT TYPE:  INP   LOCATION:  2041                                 FACILITY:  [facility]   PHYSICIAN:  Ivin Poot, M.D.               DATE OF BIRTH:  October 09, 1945   DATE OF ADMISSION:  12/20/2003  DATE OF DISCHARGE:  12/25/2003                                 DISCHARGE SUMMARY   ADMISSION DIAGNOSIS:  Abnormal electrocardiogram and Cardiolite study.   PAST MEDICAL HISTORY:  1. Hypertension.  2. Hypercholesterolemia.  3. History of diverticulitis.  4. History of umbilical hernia repair.  5. Status post appendectomy.  6. Status post cervical fusion.  7. History colon resection.  8. History of seizure disorder.   ALLERGIES:  TALWIN.   DISCHARGE DIAGNOSES:  1. Class 4 unstable angina, with severe three-vessel coronary artery     disease, status post coronary artery bypass graft surgery x4.  2. Postoperative anemia.   HISTORY OF PRESENT ILLNESS:  The patient is a 65 year old [name] male who was  evaluated by Dr. Peter M. Martinique for an abnormal Cardiolite and  electrocardiogram.  The patient's cardiac risk factors include hypertension  and hypercholesterolemia.  The patient has a history of epileptic seizures,  and prior to being evaluated by Dr. Martinique experienced an episode while he  was working in his yard where he became red in the face and arms.  His wife  was concerned, so she took him to an urgent medical care.  At that time an  electrocardiogram showed a question of anterior myocardial infarction with  age undetermined.  The patient denied any history of coronary artery  disease; however, he did have some chest pain when going up and down stairs.  The patient denied any dyspnea.  Dr. Martinique suggested that the patient  undergo a stress Cardiolite, which was performed on December 16, 2003.  The  patient had excellent exercise tolerance.  He developed mild  dyspnea and had  a 1 mm of ST depression inferiorly.  The patient's subsequent Cardiolite  images demonstrated moderate to severe ischemia, involving the anterior  apical region and distal inferior wall.  The patient's left ventricular  function was normal at 53%.  Due to he abnormality of the Cardiolite study,  the patient was admitted on December 21, 2003, for a cardiac catheterization.   HOSPITAL COURSE:  The patient was admitted on December 21, 2003, for a cardiac  catheterization.  This was performed by Dr. Martinique, and revealed severe  three-vessel coronary artery disease and a normal left ventricular function.  The patient's ejection fraction was estimated at 60%.  In light of the  patient's coronary artery disease, Dr. Tharon Aquas Trigt of [facility]  was  consulted regarding surgical revascularization.  Dr. Prescott Gum evaluated the  patient and reviewed the information, and it was his opinion that the  patient should undergo a coronary artery bypass graft surgery.  The patient  was taken to the operating room on December 21, 2003, for a coronary artery  bypass graft surgery x4.  Endoscopic vein harvest was performed on the  right.  The left internal mammary artery was grafted to the left anterior  descending, the saphenous vein was grafted to the diagonal, the saphenous  vein was grafted to the circumflex marginal, and the saphenous vein was  grafted to the right coronary artery.  The patient tolerated the procedure  well, and was stable immediately postoperatively.  The patient was  transferred from the operating room to the SICU in stable condition.  The  patient was activated without complication and woke up from anesthesia  neurologically intact.  Postoperatively the patient was noted to have a hemoglobin of 8.6, and was  therefore transfused with 1 unit of packed RBC's.  The patient's  postoperative course has run as expected.  On postoperative day one the  patient's __________  were  discontinued without complications.  The patient  was volume overloaded and was therefore diuresed.  On postoperative day two,  the patient was in stable condition and was transferred from the SICU to  units 8185 without complications.  The patient began cardiac rehab on  postoperative day two, and tolerated this as expected.  On postoperative day  four the patient was without complaints.  He was afebrile, and his vital  signs were stable.  The patient was maintaining a normal sinus rhythm.  On  physical exam the incisions were clean and dry.  The lungs were clear, and  the abdomen was benign.  A chest x-ray was clear.  The patient had rested  well and is felt to be stable for discharge in one to two days.   LABORATORY DATA:  CBC on December 24, 3003:  [place] count 12.3, hemoglobin 11.3,  hematocrit 33.2, platelets 145.  BMP on December 24, 2003:  Sodium 136,  potassium 3.7, BUN 18, creatinine 0.9, glucose 119.   CONDITION ON DISCHARGE:  Improved.   DISCHARGE MEDICATIONS:  1. Aspirin 325 mg p.o. daily.  2. Toprol XL 50 mg p.o. daily.  3. Lisinopril 20 mg p.o. daily.  4. Zocor 20 mg p.o. daily.  5. Lasix 40 mg p.o. daily x7 days.  6. K-Dur 20 mEq p.o. daily x7 days.  7. Ativan 1 mg p.o. t.i.d.  8. Keppra 1500 mg p.o. b.i.d.  9. Colace 100 mg, one to two p.o. daily p.r.n. constipation.   PAIN MANAGEMENT:  Percocet 5 mg per 325 mg, one to two p.o. q.4-6h. p.r.n.  pain.   ACTIVITY:  No driving, no strenuous activity, no lifting of more than 10  pounds.  The patient is to continue daily breathing and walking exercises.   DIET:  A low-salt, low-fat diet.   WOUND CARE:  The patient may shower daily and clean his incisions with soap  and water.  If the incisions become red, swollen, or drain,  or if the  patient has a fever of 101 degrees F, he is to call the [facility].   FOLLOWUP:  1. The patient is to call Dr. Martinique, to schedule a two-week follow-up    appointment after discharge.   The patient will have an x-ray taken at     that appointment with Dr. Martinique, which he is to bring with him to the     appointment     with Dr. Prescott Gum.  2. The patient has an appointment with Dr. Prescott Gum three weeks after     discharge.  The [facility] Office will call the patient with the date and time.      Leta Baptist, [place]                      Ivin Poot, M.D.    AY/MEDQ  D:  12/25/2003  T:  12/26/2003  Job:  [id number]   cc:   Peter M. Martinique, M.D.  [address] [place], [place] [address]  Fax: [phone]

## 2011-01-11 NOTE — Op Note (Signed)
[name], [name] NO.:  [id number]   MEDICAL RECORD NO.:  [id number]          PATIENT TYPE:  INP   LOCATION:  3701                         FACILITY:  [facility]   PHYSICIAN:  Jeryl Columbia, M.D.    DATE OF BIRTH:  Nov 25, 1945   DATE OF PROCEDURE:  10/02/2006  DATE OF DISCHARGE:                               OPERATIVE REPORT   SURGEON:  Jeryl Columbia, M.D.   PROCEDURE:  Flexible sigmoidoscopy with biopsy.   INDICATIONS:  Diarrhea, Bannan cells in the stool.  Consent was signed  after risks, benefits, methods and options were thoroughly discussed  yesterday in the hospital, and today prior to any sedation.   MEDICINES USED:  Fentanyl 25 mcg, Versed 4 mg.   DESCRIPTION OF PROCEDURE:  Rectal inspection was pertinent for small  external hemorrhoids.  Digital exam was negative.  The video colonoscope  was inserted, easily advanced to about 15 cm, where the anastomosis was  seen.  It was a small bowel side-to-colon side anastomosis.  Both ends  of the anastomosis were seen.  The scope was advanced approximately to  45 cm up the small bowel.  No abnormalities were seen.  A few random  biopsies were obtained on slow withdrawal.  Prep was adequate.  Back in  the distal sigmoid and rectum, a few scattered biopsies of that were  obtained and put in a second container.  The anorectal pullthrough on  retroflexion confirmed some small hemorrhoids.  The scope was  straightened, air was suctioned, the scope removed.  The patient  tolerated the procedure well.  There was no obvious immediate  complication.   ENDOSCOPIC DIAGNOSES:  1. Internal and external small hemorrhoids.  2. Negative exam to approximately 45 cm up the small bowel, status      post random biopsies of both the rectosigmoid and the terminal      ileum.  3. Small bowel side-to-colon side anastomosis.   PLAN:  1. Await pathology.  2. Continue workup with an upper GI small bowel series tomorrow.     ______________________________  Jeryl Columbia, M.D.     MEM/MEDQ  D:  10/02/2006  T:  10/02/2006  Job:  [id number]   cc:   Peter M. Martinique, M.D.  Almyra Free A. Jimmye Norman, M.D.

## 2011-03-25 ENCOUNTER — Other Ambulatory Visit: Payer: Self-pay | Admitting: Cardiology

## 2011-03-25 NOTE — Telephone Encounter (Signed)
escribe medication per fax request  

## 2011-05-02 ENCOUNTER — Telehealth: Payer: Self-pay | Admitting: Cardiology

## 2011-05-02 ENCOUNTER — Other Ambulatory Visit: Payer: Self-pay | Admitting: Cardiology

## 2011-05-02 NOTE — Telephone Encounter (Signed)
Pt returning call to [place]. Please call back.

## 2011-05-02 NOTE — Telephone Encounter (Signed)
Discuss getting a rx for celebrex.

## 2011-05-02 NOTE — Telephone Encounter (Signed)
escribe medication per fax request  

## 2011-05-02 NOTE — Telephone Encounter (Signed)
Will call back

## 2011-05-03 MED ORDER — CELECOXIB 200 MG PO CAPS: 200.0000 mg | ORAL_CAPSULE | Freq: Every day | ORAL | Status: AC

## 2011-05-03 NOTE — Telephone Encounter (Signed)
Called requesting Rx for Celebrax. States Dr. Martinique said he would give him a prescription for a month. From office note 04/03/2009 Dr. Martinique says he will give hm Rx short term. Dr. Martinique states he will give him 1 month with 1 refill but will need to check BP more frequently. Advised pt. Will send Rx into [facility].

## 2011-05-29 DIAGNOSIS — Z8679 Personal history of other diseases of the circulatory system: Secondary | ICD-10-CM | POA: Insufficient documentation

## 2011-05-30 ENCOUNTER — Telehealth: Payer: Self-pay | Admitting: Cardiology

## 2011-05-30 NOTE — Telephone Encounter (Signed)
LOV,Echo faxed to [facility] @ [phone]   05/30/11/km

## 2011-06-12 ENCOUNTER — Encounter: Payer: Self-pay | Admitting: *Deleted

## 2011-06-12 DIAGNOSIS — M549 Dorsalgia, unspecified: Secondary | ICD-10-CM | POA: Insufficient documentation

## 2011-06-12 DIAGNOSIS — F1011 Alcohol abuse, in remission: Secondary | ICD-10-CM | POA: Insufficient documentation

## 2011-06-12 DIAGNOSIS — F329 Major depressive disorder, single episode, unspecified: Secondary | ICD-10-CM | POA: Insufficient documentation

## 2011-06-12 DIAGNOSIS — I499 Cardiac arrhythmia, unspecified: Secondary | ICD-10-CM | POA: Insufficient documentation

## 2011-06-12 DIAGNOSIS — H9190 Unspecified hearing loss, unspecified ear: Secondary | ICD-10-CM | POA: Insufficient documentation

## 2011-06-12 DIAGNOSIS — K5792 Diverticulitis of intestine, part unspecified, without perforation or abscess without bleeding: Secondary | ICD-10-CM | POA: Insufficient documentation

## 2011-06-12 DIAGNOSIS — Z8669 Personal history of other diseases of the nervous system and sense organs: Secondary | ICD-10-CM | POA: Insufficient documentation

## 2011-06-13 ENCOUNTER — Ambulatory Visit (INDEPENDENT_AMBULATORY_CARE_PROVIDER_SITE_OTHER): Payer: Medicare Other | Admitting: Cardiology

## 2011-06-13 ENCOUNTER — Telehealth: Payer: Self-pay | Admitting: *Deleted

## 2011-06-13 ENCOUNTER — Encounter: Payer: Self-pay | Admitting: *Deleted

## 2011-06-13 ENCOUNTER — Encounter: Payer: Self-pay | Admitting: Cardiology

## 2011-06-13 VITALS — BP 139/88 | HR 85 | Ht 68.0 in | Wt 193.8 lb

## 2011-06-13 DIAGNOSIS — I2510 Atherosclerotic heart disease of native coronary artery without angina pectoris: Secondary | ICD-10-CM

## 2011-06-13 DIAGNOSIS — I10 Essential (primary) hypertension: Secondary | ICD-10-CM | POA: Insufficient documentation

## 2011-06-13 DIAGNOSIS — E7800 Pure hypercholesterolemia, unspecified: Secondary | ICD-10-CM

## 2011-06-13 DIAGNOSIS — E119 Type 2 diabetes mellitus without complications: Secondary | ICD-10-CM | POA: Insufficient documentation

## 2011-06-13 NOTE — Patient Instructions (Signed)
Continue your current medications.  You are clear for your surgery.  You may stop ASA for this procedure.  I will see you again in 1 year

## 2011-06-13 NOTE — Assessment & Plan Note (Signed)
Blood pressure control is acceptable.

## 2011-06-13 NOTE — Telephone Encounter (Signed)
Called Dr. Alona Bene office [phone] at [facility] to get Fax # to send to them for surgical clearance. Fax # [phone]

## 2011-06-13 NOTE — Assessment & Plan Note (Signed)
He is status post CABG in 2005. He is asymptomatic. His last stress test in 2010 and was normal. He is cleared for his planned urologic surgery. He may come off of aspirin for this procedure. I will plan a followup again in one year.

## 2011-06-13 NOTE — Progress Notes (Signed)
[name] Date of Birth: July 17, 1946 Medical Record #[id number]  History of Present Illness: [name] is seen for yearly followup visit. He is scheduled for urologic surgery tomorrow for a penile implant. He needs surgical clearance. He has been doing very well from a cardiac standpoint without any significant symptoms of chest pain, shortness of breath, or palpitations. He did try taking Celebrex for his back pain but this resulted in a significant increase in his blood pressure so he quit taking it. His blood sugars have been fairly well controlled between 110 and 115. He remains active. He has a history of coronary disease and is status post CABG in 2005. This included an LIMA graft to the LAD, saphenous vein graft to the diagonal, saphenous vein graft to the circumflex marginal, and saphenous vein graft to the right coronary. His last nuclear stress test in February 2010 was normal.  Current Outpatient Prescriptions on File Prior to Visit  Medication Sig Dispense Refill  . LORazepam (ATIVAN) 1 MG tablet Take 1 mg by mouth every 6 (six) hours as needed.        Marland Kitchen losartan-hydrochlorothiazide (HYZAAR) 100-25 MG per tablet TAKE ONE TABLET BY MOUTH DAILY  30 tablet  2  . metFORMIN (GLUCOPHAGE) 500 MG tablet Take 500 mg by mouth 2 (two) times daily with a meal.        . metoprolol (LOPRESSOR) 100 MG tablet Take 100 mg by mouth 2 (two) times daily.       . nitroGLYCERIN (NITROSTAT) 0.4 MG SL tablet Place 1 tablet (0.4 mg total) under the tongue every 5 (five) minutes as needed for chest pain.  25 tablet  11  . omeprazole (PRILOSEC) 40 MG capsule Take 40 mg by mouth daily.        . simvastatin (ZOCOR) 40 MG tablet Take 40 mg by mouth at bedtime.        . traZODone (DESYREL) 100 MG tablet Take 100 mg by mouth at bedtime.        Marland Kitchen ZETIA 10 MG tablet Take 1 tablet by mouth Daily.      . Zolpidem Tartrate (AMBIEN PO) Take by mouth as needed.          Allergies  Allergen Reactions  . Doxycycline   .  Keflex   . Talwin     Past Medical History  Diagnosis Date  . Coronary artery disease   . Hyperlipidemia   . Hypertension   . History of seizure disorder   . History of atrial flutter   . Depression   . Back pain     persistent  . Hearing loss   . Diverticulitis   . H/O alcohol abuse     Past Surgical History  Procedure Date  . Partial colectomy     for diverticuli  . Orchiectomy   . Umbilical hernia repair   . Cervical fusion   . Coronary artery bypass graft 2005    LIMA graft to LAD,saphenous vein graft to diag.,circumflex, marginal,and to the RCA  . Cardiac catheterization   . Appendectomy     History  Smoking status  . Never Smoker   Smokeless tobacco  . Not on file    History  Alcohol Use  . Yes    rare beer    History reviewed. No pertinent family history.  Review of Systems: The review of systems is positive for  Chronic back pain.  All other systems were reviewed and are negative.  Physical Exam:  BP 139/88  Pulse 85  Ht 5' 8"  (1.727 m)  Wt 193 lb 12.8 oz (87.907 kg)  BMI 29.47 kg/m2 The patient is alert and oriented x 3.  The mood and affect are normal.  The skin is warm and dry.  Color is normal.  The HEENT exam reveals that the sclera are nonicteric.  The mucous membranes are moist.  The carotids are 2+ without bruits.  There is no thyromegaly.  There is no JVD.  The lungs are clear.  The chest wall is non tender. He has a median sternotomy scar.  The heart exam reveals a regular rate with a normal S1 and S2.  There are no murmurs, gallops, or rubs.  The PMI is not displaced.   Abdominal exam reveals good bowel sounds.  There is no guarding or rebound.  There is no hepatosplenomegaly or tenderness.  There are no masses.  Exam of the legs reveal no clubbing, cyanosis, or edema.  The legs are without rashes.  The distal pulses are intact.  Cranial nerves II - XII are intact.  Motor and sensory functions are intact.  The gait is normal.  LABORATORY  DATA:  ECG today is normal.   Assessment / Plan:

## 2011-06-29 ENCOUNTER — Other Ambulatory Visit: Payer: Self-pay | Admitting: Cardiology

## 2011-10-04 DIAGNOSIS — L570 Actinic keratosis: Secondary | ICD-10-CM | POA: Diagnosis not present

## 2011-10-23 ENCOUNTER — Ambulatory Visit: Payer: Medicare Other

## 2011-10-23 ENCOUNTER — Ambulatory Visit (INDEPENDENT_AMBULATORY_CARE_PROVIDER_SITE_OTHER): Payer: Medicare Other | Admitting: Family Medicine

## 2011-10-23 VITALS — BP 130/86 | HR 92 | Temp 97.9°F | Resp 16 | Ht 65.0 in | Wt 197.2 lb

## 2011-10-23 DIAGNOSIS — R059 Cough, unspecified: Secondary | ICD-10-CM | POA: Diagnosis not present

## 2011-10-23 DIAGNOSIS — R05 Cough: Secondary | ICD-10-CM

## 2011-10-23 LAB — POCT CBC
Hemoglobin: 14.9 g/dL (ref 14.1–18.1)
Lymph, poc: 2.7 (ref 0.6–3.4)
MCH, POC: 30.6 pg (ref 27–31.2)
MCHC: 32 g/dL (ref 31.8–35.4)
MCV: 95.5 fL (ref 80–97)
MPV: 8.6 fL (ref 0–99.8)
POC MID %: 6.1 %M (ref 0–12)
WBC: 8.6 10*3/uL (ref 4.6–10.2)

## 2011-10-23 MED ORDER — MOMETASONE FURO-FORMOTEROL FUM 100-5 MCG/ACT IN AERO: 2.0000 | INHALATION_SPRAY | Freq: Two times a day (BID) | RESPIRATORY_TRACT | Status: DC

## 2011-10-23 NOTE — Patient Instructions (Signed)
Use inhaler 2 puffs every 12 hours Prilosec in the morning and at night. Call Monday with status

## 2011-10-23 NOTE — Progress Notes (Signed)
Subjective:    Patient ID: [name], male    DOB: Oct 17, 1945, 66 y.o.   MRN: [id number]  HPI [name] presents today c/o paroxysmal dry cough for 2-3 months that has not been associated with an illness.  States that the cough comes on abruptly and can lead to gasping for air.  Never productive.  More noticeable at night and states that he does notice acid like taste.  He mentions that every time he takes the first sip of a drink, he feels like he chokes and it makes him cough.  On Prilosec after being on ?NSAID 2 yrs ago s/p back surgery and had GI bleed.  No GERD sx currently.  Non smoker. No SOB, CP, palpitations. He does state he wheeze at times and mentions he has used an inhaler in the past and had asthma as a child.  Past Medical History  Diagnosis Date  . Coronary artery disease   . Hyperlipidemia   . Hypertension   . History of seizure disorder   . History of atrial flutter   . Depression   . Back pain     persistent  . Hearing loss   . Diverticulitis   . H/O alcohol abuse    Prior to Admission medications   Medication Sig Start Date End Date Taking? Authorizing Provider  atorvastatin (LIPITOR) 40 MG tablet Take 40 mg by mouth daily.   Yes Historical Provider, MD  LORazepam (ATIVAN) 1 MG tablet Take 1 mg by mouth every 6 (six) hours as needed.     Yes Historical Provider, MD  losartan-hydrochlorothiazide (HYZAAR) 100-25 MG per tablet TAKE 1 TABLET BY MOUTH DAILY 06/29/11  Yes Peter Martinique, MD  metFORMIN (GLUCOPHAGE) 500 MG tablet Take 500 mg by mouth 2 (two) times daily with a meal.     Yes Historical Provider, MD  metoprolol (LOPRESSOR) 100 MG tablet Take 100 mg by mouth 2 (two) times daily.    Yes Historical Provider, MD  nitroGLYCERIN (NITROSTAT) 0.4 MG SL tablet Place 1 tablet (0.4 mg total) under the tongue every 5 (five) minutes as needed for chest pain. 11/23/10 11/23/11 Yes Peter Martinique, MD  omeprazole (PRILOSEC) 40 MG capsule Take 40 mg by mouth daily.     Yes  Historical Provider, MD  testosterone cypionate (DEPOTESTOTERONE CYPIONATE) 200 MG/ML injection  03/18/11  Yes Historical Provider, MD  traZODone (DESYREL) 100 MG tablet Take 100 mg by mouth at bedtime.     Yes Historical Provider, MD  Zolpidem Tartrate (AMBIEN PO) Take by mouth as needed.     Yes Historical Provider, MD  mometasone-formoterol (DULERA) 100-5 MCG/ACT AERO Inhale 2 puffs into the lungs 2 (two) times daily. 03/20/35   Mathews Robinsons, PA-C  simvastatin (ZOCOR) 40 MG tablet Take 40 mg by mouth at bedtime.      Historical Provider, MD  ZETIA 10 MG tablet Take 1 tablet by mouth Daily. 05/31/11   Historical Provider, MD     Review of Systems  Constitutional: Negative for fever, chills, appetite change, fatigue and unexpected weight change.  HENT: Negative for congestion and sore throat.   Respiratory: Positive for cough and wheezing. Negative for chest tightness and shortness of breath.   Cardiovascular: Negative for chest pain and palpitations.  Gastrointestinal: Negative for nausea and vomiting.  Musculoskeletal: Negative for myalgias.  Neurological: Negative for headaches.       Objective:   Physical Exam  Constitutional: He appears well-developed and well-nourished.  HENT:  Right Ear: Tympanic  membrane normal.  Left Ear: Tympanic membrane normal.  Nose: No mucosal edema.  Mouth/Throat: Oropharynx is clear and moist.  Cardiovascular: Normal rate and regular rhythm.   Pulmonary/Chest: Effort normal and breath sounds normal.  Abdominal: Normal appearance. There is no tenderness.  Lymphadenopathy:    He has no cervical adenopathy.       Right: No supraclavicular adenopathy present.       Left: No supraclavicular adenopathy present.  Skin: Skin is warm.  Psychiatric: He has a normal mood and affect.    [facility] reading (PRIMARY) by  Dr. Joseph Art. No infiltrate.         Assessment & Plan:  1. Cough, persistent. ? GERD or aspiration vs. Other cause  Start Dulera and  Prilosec 20 mg bid.  Check Pertussis level. Call Monday. Recommend f/u with GI.

## 2011-10-27 LAB — BORDETELLA PERTUSSIS ANTIBODY: B pertussis IgG Ab, Quant: 5.6 U/mL — ABNORMAL HIGH (ref <=2.4)

## 2011-10-28 DIAGNOSIS — L570 Actinic keratosis: Secondary | ICD-10-CM | POA: Diagnosis not present

## 2011-10-28 DIAGNOSIS — L820 Inflamed seborrheic keratosis: Secondary | ICD-10-CM | POA: Diagnosis not present

## 2011-10-28 DIAGNOSIS — H01009 Unspecified blepharitis unspecified eye, unspecified eyelid: Secondary | ICD-10-CM | POA: Diagnosis not present

## 2011-10-30 DIAGNOSIS — E291 Testicular hypofunction: Secondary | ICD-10-CM | POA: Diagnosis not present

## 2011-10-30 DIAGNOSIS — N529 Male erectile dysfunction, unspecified: Secondary | ICD-10-CM | POA: Diagnosis not present

## 2011-11-04 ENCOUNTER — Ambulatory Visit (INDEPENDENT_AMBULATORY_CARE_PROVIDER_SITE_OTHER): Payer: Medicare Other | Admitting: Family Medicine

## 2011-11-04 VITALS — BP 135/82 | HR 69 | Temp 98.2°F | Resp 16 | Ht 65.0 in | Wt 200.0 lb

## 2011-11-04 DIAGNOSIS — R05 Cough: Secondary | ICD-10-CM | POA: Diagnosis not present

## 2011-11-04 DIAGNOSIS — R059 Cough, unspecified: Secondary | ICD-10-CM

## 2011-11-04 DIAGNOSIS — J209 Acute bronchitis, unspecified: Secondary | ICD-10-CM | POA: Diagnosis not present

## 2011-11-04 MED ORDER — METHYLPREDNISOLONE 4 MG PO TABS
ORAL_TABLET | ORAL | Status: DC
Start: 2011-11-04 — End: 2012-02-10

## 2011-11-04 NOTE — Progress Notes (Signed)
[name] presents today c/o paroxysmal dry cough for 2-3 months that has not been associated with an illness. He has improved since last visit but still coughs some at night Past Medical History   Diagnosis  Date   .  Coronary artery disease    .  Hyperlipidemia    .  Hypertension    .  History of seizure disorder    .  History of atrial flutter    .  Depression    .  Back pain      persistent   .  Hearing loss    .  Diverticulitis    .  H/O alcohol abuse     Prior to Admission medications   Medication  Sig  Start Date  End Date  Taking?  Authorizing Provider   atorvastatin (LIPITOR) 40 MG tablet  Take 40 mg by mouth daily.    Yes  Historical Provider, MD   LORazepam (ATIVAN) 1 MG tablet  Take 1 mg by mouth every 6 (six) hours as needed.    Yes  Historical Provider, MD   losartan-hydrochlorothiazide (HYZAAR) 100-25 MG per tablet  TAKE 1 TABLET BY MOUTH DAILY  06/29/11   Yes  Peter Martinique, MD   metFORMIN (GLUCOPHAGE) 500 MG tablet  Take 500 mg by mouth 2 (two) times daily with a meal.    Yes  Historical Provider, MD   metoprolol (LOPRESSOR) 100 MG tablet  Take 100 mg by mouth 2 (two) times daily.    Yes  Historical Provider, MD   nitroGLYCERIN (NITROSTAT) 0.4 MG SL tablet  Place 1 tablet (0.4 mg total) under the tongue every 5 (five) minutes as needed for chest pain.  11/23/10  11/23/11  Yes  Peter Martinique, MD   omeprazole (PRILOSEC) 40 MG capsule  Take 40 mg by mouth daily.    Yes  Historical Provider, MD   testosterone cypionate (DEPOTESTOTERONE CYPIONATE) 200 MG/ML injection   03/18/11   Yes  Historical Provider, MD   traZODone (DESYREL) 100 MG tablet  Take 100 mg by mouth at bedtime.    Yes  Historical Provider, MD   Zolpidem Tartrate (AMBIEN PO)  Take by mouth as needed.    Yes  Historical Provider, MD   mometasone-formoterol (DULERA) 100-5 MCG/ACT AERO  Inhale 2 puffs into the lungs 2 (two) times daily.  8/84/16    Mathews Robinsons, PA-C   simvastatin (ZOCOR) 40 MG tablet  Take 40 mg by  mouth at bedtime.     Historical Provider, MD   ZETIA 10 MG tablet  Take 1 tablet by mouth Daily.  05/31/11    Historical Provider, MD   Review of Systems  Constitutional: Negative for fever, chills, appetite change, fatigue and unexpected weight change.  HENT: Negative for congestion and sore throat.  Respiratory: Positive for cough and wheezing. Negative for chest tightness and shortness of breath.  Cardiovascular: Negative for chest pain and palpitations.  Gastrointestinal: Negative for nausea and vomiting.  Musculoskeletal: Negative for myalgias.  Neurological: Negative for headaches.    Objective:   Physical Exam  Constitutional: He appears well-developed and well-nourished.  HENT:  Right Ear: Tympanic membrane normal.  Left Ear: Tympanic membrane normal.  Nose: No mucosal edema.  Mouth/Throat: Oropharynx is clear and moist.  Cardiovascular: Normal rate and regular rhythm.  Pulmonary/Chest: Effort normal and breath sounds show few exp wheezes left side  Lymphadenopathy:  He has no cervical adenopathy.  Right: No supraclavicular adenopathy present.  Left: No  supraclavicular adenopathy present.  Skin: Skin is warm.  Psychiatric: He has a normal mood and affect.    A:  Improved but RAD persists P:  Medrol 4 mg two daily x 5  Hydromet 5 ml q6h prn

## 2011-11-08 DIAGNOSIS — R1013 Epigastric pain: Secondary | ICD-10-CM | POA: Diagnosis not present

## 2011-11-08 DIAGNOSIS — K912 Postsurgical malabsorption, not elsewhere classified: Secondary | ICD-10-CM | POA: Diagnosis not present

## 2011-11-08 DIAGNOSIS — K3189 Other diseases of stomach and duodenum: Secondary | ICD-10-CM | POA: Diagnosis not present

## 2011-12-08 ENCOUNTER — Ambulatory Visit (INDEPENDENT_AMBULATORY_CARE_PROVIDER_SITE_OTHER): Payer: Medicare Other | Admitting: Emergency Medicine

## 2011-12-08 VITALS — BP 154/87 | HR 69 | Temp 98.7°F | Resp 16 | Ht 65.0 in | Wt 200.0 lb

## 2011-12-08 DIAGNOSIS — H93099 Unspecified degenerative and vascular disorders of unspecified ear: Secondary | ICD-10-CM

## 2011-12-08 DIAGNOSIS — G4700 Insomnia, unspecified: Secondary | ICD-10-CM

## 2011-12-08 MED ORDER — ZOLPIDEM TARTRATE 10 MG PO TABS: 5.0000 mg | ORAL_TABLET | Freq: Every evening | ORAL | Status: DC | PRN

## 2011-12-08 NOTE — Progress Notes (Signed)
  Subjective:    Patient ID: [name], male    DOB: August 16, 1946, 66 y.o.   MRN: [id number]  HPI Patient enters with a chief complaint of wax impaction both ears ears. His hearing is significantly diminished. He wears hearing aids.    Review of Systems     Objective:   Physical Exam is good exam reveals wax impaction in both external auditory canals.        Assessment & Plan:  We'll proceed with urination

## 2011-12-13 DIAGNOSIS — M545 Low back pain: Secondary | ICD-10-CM | POA: Diagnosis not present

## 2011-12-13 DIAGNOSIS — M5137 Other intervertebral disc degeneration, lumbosacral region: Secondary | ICD-10-CM | POA: Diagnosis not present

## 2011-12-13 DIAGNOSIS — M549 Dorsalgia, unspecified: Secondary | ICD-10-CM | POA: Diagnosis not present

## 2011-12-13 DIAGNOSIS — M4310 Spondylolisthesis, site unspecified: Secondary | ICD-10-CM | POA: Diagnosis not present

## 2011-12-26 ENCOUNTER — Other Ambulatory Visit: Payer: Self-pay | Admitting: Cardiology

## 2011-12-30 ENCOUNTER — Other Ambulatory Visit: Payer: Self-pay | Admitting: Cardiology

## 2011-12-31 ENCOUNTER — Telehealth: Payer: Self-pay | Admitting: Cardiology

## 2011-12-31 MED ORDER — LOSARTAN POTASSIUM-HCTZ 100-25 MG PO TABS: 1.0000 | ORAL_TABLET | Freq: Every day | ORAL | Status: DC

## 2011-12-31 NOTE — Telephone Encounter (Signed)
mycardis 11m , [facility] and needs called in asap, pharmacy states they requested last week

## 2011-12-31 NOTE — Telephone Encounter (Signed)
Refilled losartan-hctz to pharmacy

## 2012-02-08 ENCOUNTER — Ambulatory Visit (INDEPENDENT_AMBULATORY_CARE_PROVIDER_SITE_OTHER): Payer: Medicare Other | Admitting: Emergency Medicine

## 2012-02-08 ENCOUNTER — Ambulatory Visit: Payer: Medicare Other

## 2012-02-08 VITALS — BP 143/91 | HR 94 | Temp 97.4°F | Resp 18 | Ht 65.0 in | Wt 205.8 lb

## 2012-02-08 DIAGNOSIS — R05 Cough: Secondary | ICD-10-CM | POA: Diagnosis not present

## 2012-02-08 DIAGNOSIS — J3489 Other specified disorders of nose and nasal sinuses: Secondary | ICD-10-CM

## 2012-02-08 DIAGNOSIS — R0981 Nasal congestion: Secondary | ICD-10-CM

## 2012-02-08 MED ORDER — AZELASTINE HCL 0.1 % NA SOLN: NASAL | Status: DC

## 2012-02-08 NOTE — Progress Notes (Signed)
  Subjective:    Patient ID: [name], male    DOB: May 04, 1946, 66 y.o.   MRN: [id number]  HPI patient enters with a history of the last few weeks been bothered with head congestion nasal congestion difficulty breathing through his nose the he's had no sore throat he has had minimal cough to his head no other real symptoms.    Review of Systems     Objective:   Physical Exam he is wearing a hearing aid on the left. His right TM is normal. His nose is very congested. His throat is normal his chest is clear to auscultation and percussion.  [facility] reading (PRIMARY) by  Dr. [name] no air fluid level        Assessment & Plan:  I suspect this is primarily allergic related. I doubt that he has an acute sinusitis. He has not had any purulent drainage and does not look acutely ill. We'll check Waters' view of the sinuses.

## 2012-02-10 ENCOUNTER — Encounter (HOSPITAL_COMMUNITY)
Admission: AD | Disposition: A | Discharge status: Home / Self Care | Payer: Self-pay | Source: Ambulatory Visit | Attending: Gastroenterology

## 2012-02-10 ENCOUNTER — Ambulatory Visit (INDEPENDENT_AMBULATORY_CARE_PROVIDER_SITE_OTHER): Payer: Medicare Other | Admitting: Emergency Medicine

## 2012-02-10 ENCOUNTER — Ambulatory Visit (HOSPITAL_COMMUNITY)
Admission: AD | Admit: 2012-02-10 | Discharge: 2012-02-10 | Disposition: A | Discharge status: Home / Self Care | Payer: Medicare Other | Source: Ambulatory Visit | Attending: Gastroenterology | Admitting: Gastroenterology

## 2012-02-10 ENCOUNTER — Encounter (HOSPITAL_COMMUNITY): Payer: Self-pay | Admitting: *Deleted

## 2012-02-10 VITALS — BP 111/63 | HR 73 | Temp 98.0°F | Resp 18 | Ht 67.0 in | Wt 204.0 lb

## 2012-02-10 DIAGNOSIS — R05 Cough: Secondary | ICD-10-CM

## 2012-02-10 DIAGNOSIS — R1310 Dysphagia, unspecified: Secondary | ICD-10-CM

## 2012-02-10 DIAGNOSIS — I4892 Unspecified atrial flutter: Secondary | ICD-10-CM | POA: Diagnosis not present

## 2012-02-10 DIAGNOSIS — R1319 Other dysphagia: Secondary | ICD-10-CM | POA: Diagnosis not present

## 2012-02-10 DIAGNOSIS — I10 Essential (primary) hypertension: Secondary | ICD-10-CM | POA: Diagnosis not present

## 2012-02-10 DIAGNOSIS — G4730 Sleep apnea, unspecified: Secondary | ICD-10-CM | POA: Insufficient documentation

## 2012-02-10 DIAGNOSIS — K2960 Other gastritis without bleeding: Secondary | ICD-10-CM | POA: Diagnosis not present

## 2012-02-10 DIAGNOSIS — K222 Esophageal obstruction: Secondary | ICD-10-CM | POA: Diagnosis not present

## 2012-02-10 DIAGNOSIS — I2510 Atherosclerotic heart disease of native coronary artery without angina pectoris: Secondary | ICD-10-CM

## 2012-02-10 DIAGNOSIS — F411 Generalized anxiety disorder: Secondary | ICD-10-CM | POA: Diagnosis not present

## 2012-02-10 DIAGNOSIS — R0981 Nasal congestion: Secondary | ICD-10-CM

## 2012-02-10 DIAGNOSIS — K319 Disease of stomach and duodenum, unspecified: Secondary | ICD-10-CM | POA: Diagnosis not present

## 2012-02-10 DIAGNOSIS — G4700 Insomnia, unspecified: Secondary | ICD-10-CM

## 2012-02-10 HISTORY — PX: ESOPHAGOGASTRODUODENOSCOPY: SHX5428

## 2012-02-10 HISTORY — PX: FOREIGN BODY REMOVAL: SHX962

## 2012-02-10 SURGERY — EGD (ESOPHAGOGASTRODUODENOSCOPY)
Anesthesia: Moderate Sedation

## 2012-02-10 MED ORDER — MIDAZOLAM HCL 10 MG/2ML IJ SOLN
INTRAMUSCULAR | Status: DC | PRN
  Administered 2012-02-10: 2.5 mg via INTRAVENOUS

## 2012-02-10 MED ORDER — FENTANYL CITRATE 0.05 MG/ML IJ SOLN
INTRAMUSCULAR | Status: AC
  Filled 2012-02-10: qty 4

## 2012-02-10 MED ORDER — MIDAZOLAM HCL 10 MG/2ML IJ SOLN
INTRAMUSCULAR | Status: AC
  Filled 2012-02-10: qty 4

## 2012-02-10 MED ORDER — SUCRALFATE 1 G PO TABS: 1.0000 g | ORAL_TABLET | Freq: Four times a day (QID) | ORAL | Status: DC

## 2012-02-10 MED ORDER — BUTAMBEN-TETRACAINE-BENZOCAINE 2-2-14 % EX AERO
INHALATION_SPRAY | CUTANEOUS | Status: DC | PRN
  Administered 2012-02-10: 2 via TOPICAL

## 2012-02-10 MED ORDER — DIPHENHYDRAMINE HCL 50 MG/ML IJ SOLN
INTRAMUSCULAR | Status: AC
  Filled 2012-02-10: qty 1

## 2012-02-10 MED ORDER — FENTANYL NICU IV SYRINGE 50 MCG/ML
INJECTION | INTRAMUSCULAR | Status: DC | PRN
  Administered 2012-02-10: 25 ug via INTRAVENOUS

## 2012-02-10 MED ORDER — SODIUM CHLORIDE 0.9 % IV SOLN
Freq: Once | INTRAVENOUS | Status: AC
  Administered 2012-02-10: 16:00:00 via INTRAVENOUS

## 2012-02-10 NOTE — Op Note (Signed)
Documentation done in endoPro; see under Procedures tab.   Cleotis Nipper, M.D. [phone]

## 2012-02-10 NOTE — Op Note (Signed)
[facility] [place], [place]  [id number]  ENDOSCOPY PROCEDURE REPORT  PATIENT:  [name], [name]  MR#:  [id number] BIRTHDATE:  [birth year], 65 yrs. old  GENDER:  male  ENDOSCOPIST:  Ronald Lobo, MD Referred by:  [name], M.D.  PROCEDURE DATE:  02/10/2012 PROCEDURE:  EGD with balloon dilatation and biopsy ASA CLASS: INDICATIONS:  Progressive dysphagia symptoms in recent weeks. Barely able to get food down over the past 2 days.  MEDICATIONS:   Fentanyl 50 mcg IV, Versed 5 mg IV TOPICAL ANESTHETIC:  DESCRIPTION OF PROCEDURE:   After the risks and benefits of the procedure were explained, informed consent was obtained.  The Pentax Gastroscope [id number] endoscope was introduced through the mouth and advanced to the second portion of the duodenum.  The instrument was slowly withdrawn as the mucosa was fully examined. <<PROCEDUREIMAGES>>  The patient had a tendency to drop his saturation down into the mid 80s, even though he was only mildly sedated. This was largely remedied simply by lifting his chin.  The larynx looked normal. The esophagus was entered without difficulty under direct vision.  The esophagus was endoscopically normal. There did appear to be a mild, but widely patent, esophageal mucosal ring at the squamocolumnar junction (Schatzki's ring). No reflux esophagitis, Barrett's esophagus, varices, infection, or neoplasia were evident. There was no evident stricture.  The stomach was entered. It contained no significant residual. The antrum of the stomach and some erosive changes consistent with patient's aspirin exposure. This was biopsied. The remainder of the stomach was normal, including retroflex view of the cardia.  The duodenum looked normal.  Based on the patient's symptoms, I elected to proceed to through-the-scope balloon dilatation of the GE junction, which was performed to 19 mm for 1 minute. After doing so, there was  no evident mucosal disruption.  COMPLICATIONS:  None  ENDOSCOPIC IMPRESSION:  1. Probable mild esophageal mucosal ring (Schatzki's ring), dilated to 19 mm by through-the-scope technique. This might or might not account for his dysphagia symptoms, which seemed to be out of proportion to the endoscopic findings  2. Mild erosive antral gastritis, probably due to aspirin therapy  RECOMMENDATIONS:  1. Clinical followup of dysphagia symptoms. Consider barium swallow with tablet the patient has persistent symptoms following today's intervention, looking for a motility disorder  2. Await pathology results. Continue PPI therapy  ______________________________ Ronald Lobo, MD  CC:  n. eSIGNEDHerbie Baltimore Kenderick Kobler at 02/10/2012 04:46 PM  Sharrell Ku, [phone]

## 2012-02-10 NOTE — H&P (Signed)
This 66 year old gentleman, followed by Dr. [name], presents as an outpatient to the [facility] endoscopy unit on an urgent basis because of significant difficulties swallowing, especially over the past 48 hours. He has had absolutely nothing to eat over the past 8 hours.    Food is extremely slow to go down. It does not sound as though he has a current food impaction, but when he presented with these symptoms to his primary physician today, he was directed to the endoscopy unit since it was felt that endoscopic evaluation was needed.   No problem with heartburn or weight loss. He was switched from omeprazole 20 mg once a day to a trial of Nexium for about a month when seen by my partner about 3 months ago, but this did not result in any evidence symptomatic improvement. Most recently, he has been using omeprazole 40 mg twice a day, despite which he still has significant swallowing problems.  Past medical history:  Allergies: Doxycycline, Keflex, cold.  Outpatient medications: 81 mg aspirin, Lipitor, Astelin spray, Ativan, Hyzaar, Glucophage, Medrol (until recently) metoprolol, omeprazole 40 twice a day, Zocor, testosterone, trazodone, Zetia, Ambien  Operations: CABG 2004, subtotal colectomy for diverticular disease 2004, orchiectomy, umbilical hernia repair, appendectomy, low back surgery 2011, penile prosthesis  Chronic medical illnesses: Atrial flutter, seizures, hyperlipidemia, previous ethanol abuse, coronary disease, history of diverticulitis, depression and anxiety, hypertension, sleep apnea (does not use CPAP)  Physical examination: An overweight, fully ambulatory Staff male in no acute distress. Anicteric, no pallor, cognitively intact. Chest clear. Heart without murmurs or arrhythmias. Abdomen obese, active bowel sounds, no bruits, no guarding, mass effect, or tenderness. No peripheral edema.  Impression: Dysphagia symptoms, intensified recently. Differential diagnosis includes  stricturing, dysmotility, neoplasia  Plan: Endoscopic evaluation. Risks reviewed. Possible through-the-scope balloon dilatation of the esophagus if a discrete stricture is identified.  Cleotis Nipper, M.D. [phone]

## 2012-02-10 NOTE — Discharge Instructions (Addendum)
Endoscopy Care After Please read the instructions outlined below and refer to this sheet in the next few weeks. These discharge instructions provide you with general information on caring for yourself after you leave the hospital. Your doctor may also give you specific instructions. While your treatment has been planned according to the most current medical practices available, unavoidable complications occasionally occur. If you have any problems or questions after discharge, please call your doctor. HOME CARE INSTRUCTIONS Activity  You may resume your regular activity but move at a slower pace for the next 24 hours.   Take frequent rest periods for the next 24 hours.   Walking will help expel (get rid of) the air and reduce the bloated feeling in your abdomen.   No driving for 24 hours (because of the anesthesia (medicine) used during the test).   You may shower.   Do not sign any important legal documents or operate any machinery for 24 hours (because of the anesthesia used during the test).  Nutrition  Drink plenty of fluids.   You may resume your normal diet.   Begin with a light meal and progress to your normal diet.   Avoid alcoholic beverages for 24 hours or as instructed by your caregiver.  Medications You may resume your normal medications unless your caregiver tells you otherwise. What you can expect today  You may experience abdominal discomfort such as a feeling of fullness or "gas" pains.   You may experience a sore throat for 2 to 3 days. This is normal. Gargling with salt water may help this.  Follow-up Your doctor will discuss the results of your test with you. SEEK IMMEDIATE MEDICAL CARE IF:  You have excessive nausea (feeling sick to your stomach) and/or vomiting.   You have severe abdominal pain and distention (swelling).   You have trouble swallowing.   You have a temperature over 100 F (37.8 C).   You have rectal bleeding or vomiting of blood.    Document Released: 03/26/2004 Document Revised: 08/01/2011 Document Reviewed: 10/07/2007 [facility] Patient Information 2012 [place]. Moderate Sedation, Adult Moderate sedation is given to help you relax or even sleep through a procedure. You may remain sleepy, be clumsy, or have poor balance for several hours following this procedure. Arrange for a responsible adult, family member, or friend to take you home. A responsible adult should stay with you for at least 24 hours or until the medicines have worn off.  Do not participate in any activities where you could become injured for the next 24 hours, or until you feel normal again. Do not:   Drive.   Swim.   Ride a bicycle.   Operate heavy machinery.   Cook.   Use power tools.   Climb ladders.   Work at [employer].   Do not make important decisions or sign legal documents until you are improved.   Vomiting may occur if you eat too soon. When you can drink without vomiting, try water, juice, or soup. Try solid foods if you feel little or no nausea.   Only take over-the-counter or prescription medications for pain, discomfort, or fever as directed by your caregiver.If pain medications have been prescribed for you, ask your caregiver how soon it is safe to take them.   Make sure you and your family fully understands everything about the medication given to you. Make sure you understand what side effects may occur.   You should not drink alcohol, take sleeping pills, or medications that  cause drowsiness for at least 24 hours.   If you smoke, do not smoke alone.   If you are feeling better, you may resume normal activities 24 hours after receiving sedation.   Keep all appointments as scheduled. Follow all instructions.   Ask questions if you do not understand.  SEEK MEDICAL CARE IF:   Your skin is pale or bluish in color.   You continue to feel sick to your stomach (nauseous) or throw up (vomit).   Your pain is getting  worse and not helped by medication.   You have bleeding or swelling.   You are still sleepy or feeling clumsy after 24 hours.  SEEK IMMEDIATE MEDICAL CARE IF:   You develop a rash.   You have difficulty breathing.   You develop any type of allergic problem.   You have a fever.  Document Released: 05/07/2001 Document Revised: 08/01/2011 Document Reviewed: 09/28/2007 [facility] Patient Information 2012 [place].  Esophageal Dilatation The esophagus is the long, narrow tube which carries food and liquid from the mouth to the stomach. Esophageal dilatation is the technique used to stretch a blocked or narrowed portion of the esophagus. This procedure is used when a part of the esophagus has become so narrow that it becomes difficult, painful or even impossible to swallow. This is generally an uncomplicated form of treatment. When this is not successful, chest surgery may be required. This is a much more extensive form of treatment with a longer recovery time. CAUSES  Some of the more common causes of blockage or strictures of the esophagus are:  Narrowing from longstanding inflammation (soreness and redness) of the lower esophagus. This comes from the constant exposure of the lower esophagus to the acid which bubbles up from the stomach. Over time this causes scarring and narrowing of the lower esophagus.   Hiatal hernia in which a small part of the stomach bulges (herniates) up through the diaphragm. This can cause a gradual narrowing of the end of the esophagus.   Schatzki's Ring is a narrow ring of benign (non-cancerous) fibrous tissue which constricts the lower esophagus. The reason for this is not known.   Scleroderma is a connective tissue disorder that affects the esophagus and makes swallowing difficult.   Achalasia is an absence of nerves to the lower esophagus and to the esophageal sphincter. This is the circular muscle between the stomach and esophagus that relaxes to allow  food into the stomach. After swallowing, it contracts to keep food in the stomach. This absence of nerves may be congenital (present since birth). This can cause irregular spasms of the lower esophageal muscle. This spasm does not open up to allow food and fluid through. The result is a persistent blockage with subsequent slow trickling of the esophageal contents into the stomach.   Strictures may develop from swallowing materials which damage the esophagus. Some examples are strong acids or alkalis such as lye.   Growths such as benign (non-cancerous) and malignant (cancerous) tumors can block the esophagus.   Heredity (present since birth) causes.  DIAGNOSIS  Your caregiver often suspects this problem by taking a medical history. They will also do a physical exam. They can then prove their suspicions using X-rays and endoscopy. Endoscopy is an exam in which a tube like a small flexible telescope is used to look at your esophagus.  TREATMENT There are different stretching (dilating) techniques which can be used. Simple bougie dilatation may be done in the office. This usually takes only  a couple minutes. A numbing (anesthetic) spray of the throat is used. Endoscopy, when done, is done in an endoscopy suite, under mild sedation. When fluoroscopy is used, the procedure is performed in X-ray. Other techniques require a little longer time. Recovery is usually quick. There is no waiting time to begin eating and drinking to test success of the treatment. Following are some of the methods used. Narrowing of the esophagus is treated by making it bigger. Commonly this is a mechanical problem which can be treated with stretching. This can be done in different ways. Your caregiver will discuss these with you. Some of the means used are:  A series of graduated (increasing thickness) flexible dilators can be used. These are weighted tubes passed through the esophagus into the stomach. The tubes used become  progressively larger until the desired stretched size is reached. Graduated dilators are a simple and quick way of opening the esophagus. No visualization is required.   Another method is the use of endoscopy to place a flexible wire across the stricture. The endoscope is removed and the wire left in place. A dilator with a hole through it from end to end is guided down the esophagus and across the stricture. One or more of these dilators are passed over the wire. At the end of the exam, the wire is removed. This type of treatment may be performed in the X-ray department under fluoroscopy. An advantage of this procedure is the examiner is visualizing the end opening in the esophagus.   Stretching of the esophagus may be done using balloons. Deflated balloons are placed through the endoscope and across the stricture. This type of balloon dilatation is often done at the time of endoscopy or fluoroscopy. Flexible endoscopy allows the examiner to directly view the stricture. A balloon is inserted in the deflated form into the area of narrowing. It is then inflated with air to a certain pressure that is pre-set for a given circumference. When inflated, it becomes sausage shaped, stretched, and makes the stricture larger.   Achalasia requires a longer larger balloon-type dilator. This is frequently done under X-ray control. In this situation, the spastic muscle fibers in the lower esophagus are stretched.  All of the above procedures make the passage of food and water into the stomach easier. They also make it easier for stomach contents to reflux back into the esophagus. Special medications may be used following the procedure to help prevent further stricturing. Proton-pump inhibitor medications are good at decreasing the amount of acid in the stomach juice. When stomach juice refluxes into the esophagus, the juice is no longer as acidic and is less likely to burn or scar the esophagus. RISKS AND  COMPLICATIONS Esophageal dilatation is usually performed effectively and without problems. Some complications that can occur are:  A small amount of bleeding almost always happens where the stretching takes place. If this is too excessive it may require more aggressive treatment.   An uncommon complication is perforation (making a hole) of the esophagus. The esophagus is thin. It is easy to make a hole in it. If this happens, an operation may be necessary to repair this.   A small, undetected perforation could lead to an infection in the chest. This can be very serious.  HOME CARE INSTRUCTIONS   If you received sedation for your procedure, do not drive, make important decisions, or perform any activities requiring your full coordination. Do not drink alcohol, take sedatives, or use any mind altering chemicals  unless instructed by your caregiver.   You may use throat lozenges or warm salt water gargles if you have throat discomfort   You can begin eating and drinking normally on return home unless instructed otherwise. Do not purposely try to force large chunks of food down to test the benefits of your procedure.   Mild discomfort can be eased with sips of ice water.   Medications for discomfort may or may not be needed.  SEEK IMMEDIATE MEDICAL CARE IF:   You begin vomiting up blood.   You develop black tarry stools   You develop chills or an unexplained temperature of over 101 F (38.3 C)   You develop chest or abdominal pain.   You develop shortness of breath or feel lightheaded or faint.   Your swallowing is becoming more painful, difficult, or you are unable to swallow.  MAKE SURE YOU:   Understand these instructions.   Will watch your condition.   Will get help right away if you are not doing well or get worse.  Document Released: 10/03/2005 Document Revised: 08/01/2011 Document Reviewed: 11/20/2005 [facility] Patient Information 2012 [place].

## 2012-02-10 NOTE — Progress Notes (Signed)
  Subjective:    Patient ID: [name], male    DOB: December 01, 1945, 66 y.o.   MRN: [id number]  Gastrophageal Reflux He complains of coughing (productive [place] foamy material) and nausea. He reports no abdominal pain, no belching, no chest pain, no choking, no dysphagia, no early satiety, no globus sensation, no heartburn, no hoarse voice, no sore throat, no stridor, no tooth decay, no water brash or no wheezing. This is a recurrent problem. The current episode started in the past 7 days. The problem has been unchanged. Pertinent negatives include no anemia, melena or orthopnea.      Review of Systems  Constitutional: Negative.   HENT: Positive for congestion and postnasal drip. Negative for sore throat and hoarse voice.   Eyes: Negative.   Respiratory: Positive for cough (productive [place] foamy material). Negative for choking, shortness of breath, wheezing and stridor.   Cardiovascular: Negative for chest pain.  Gastrointestinal: Positive for nausea. Negative for heartburn, dysphagia, abdominal pain and melena.  Genitourinary: Positive for frequency.  Musculoskeletal: Negative.   Neurological: Negative.        Objective:   Physical Exam  Constitutional: He is oriented to person, place, and time. He appears well-developed and well-nourished.  HENT:  Head: Normocephalic and atraumatic.  Right Ear: External ear normal.  Left Ear: External ear normal.  Nose: Nose normal.  Mouth/Throat: Oropharynx is clear and moist.  Eyes: Conjunctivae are normal. Pupils are equal, round, and reactive to light. No scleral icterus.  Neck: Normal range of motion. Neck supple.  Cardiovascular: Normal rate, regular rhythm and normal heart sounds.   Pulmonary/Chest: Effort normal and breath sounds normal.  Abdominal: Soft.  Musculoskeletal: Normal range of motion.  Neurological: He is alert and oriented to person, place, and time.          Assessment & Plan:  I think the symptoms he describes are  related to reflux and possibly an esophageal spasm or narrowing.  I recommend he follow up with his gastroenterologist ASAP

## 2012-02-11 ENCOUNTER — Encounter (HOSPITAL_COMMUNITY): Payer: Self-pay | Admitting: Gastroenterology

## 2012-02-19 DIAGNOSIS — M5137 Other intervertebral disc degeneration, lumbosacral region: Secondary | ICD-10-CM | POA: Diagnosis not present

## 2012-02-19 DIAGNOSIS — M545 Low back pain: Secondary | ICD-10-CM | POA: Diagnosis not present

## 2012-02-19 DIAGNOSIS — M51379 Other intervertebral disc degeneration, lumbosacral region without mention of lumbar back pain or lower extremity pain: Secondary | ICD-10-CM | POA: Diagnosis not present

## 2012-02-19 DIAGNOSIS — G8929 Other chronic pain: Secondary | ICD-10-CM | POA: Diagnosis not present

## 2012-02-19 DIAGNOSIS — M549 Dorsalgia, unspecified: Secondary | ICD-10-CM | POA: Diagnosis not present

## 2012-02-25 DIAGNOSIS — M5380 Other specified dorsopathies, site unspecified: Secondary | ICD-10-CM | POA: Diagnosis not present

## 2012-03-03 DIAGNOSIS — M5380 Other specified dorsopathies, site unspecified: Secondary | ICD-10-CM | POA: Diagnosis not present

## 2012-03-17 DIAGNOSIS — E119 Type 2 diabetes mellitus without complications: Secondary | ICD-10-CM | POA: Diagnosis not present

## 2012-03-17 DIAGNOSIS — M5380 Other specified dorsopathies, site unspecified: Secondary | ICD-10-CM | POA: Diagnosis not present

## 2012-03-31 ENCOUNTER — Other Ambulatory Visit: Payer: Self-pay | Admitting: Emergency Medicine

## 2012-04-02 ENCOUNTER — Encounter (HOSPITAL_COMMUNITY)
Admission: EM | Disposition: A | Discharge status: Home Health | Payer: Self-pay | Source: Home / Self Care | Attending: Family Medicine

## 2012-04-02 ENCOUNTER — Inpatient Hospital Stay (HOSPITAL_COMMUNITY)
Admission: EM | Admit: 2012-04-02 | Discharge: 2012-04-04 | DRG: 101 | Disposition: A | Discharge status: Home Health | Payer: Medicare Other | Attending: Family Medicine | Admitting: Family Medicine

## 2012-04-02 ENCOUNTER — Other Ambulatory Visit: Payer: Self-pay | Admitting: *Deleted

## 2012-04-02 ENCOUNTER — Emergency Department (HOSPITAL_COMMUNITY): Payer: Medicare Other

## 2012-04-02 ENCOUNTER — Other Ambulatory Visit: Payer: Self-pay

## 2012-04-02 DIAGNOSIS — I499 Cardiac arrhythmia, unspecified: Secondary | ICD-10-CM | POA: Diagnosis present

## 2012-04-02 DIAGNOSIS — E119 Type 2 diabetes mellitus without complications: Secondary | ICD-10-CM | POA: Diagnosis present

## 2012-04-02 DIAGNOSIS — F329 Major depressive disorder, single episode, unspecified: Secondary | ICD-10-CM | POA: Diagnosis present

## 2012-04-02 DIAGNOSIS — R42 Dizziness and giddiness: Secondary | ICD-10-CM | POA: Diagnosis not present

## 2012-04-02 DIAGNOSIS — F3289 Other specified depressive episodes: Secondary | ICD-10-CM | POA: Diagnosis present

## 2012-04-02 DIAGNOSIS — I2510 Atherosclerotic heart disease of native coronary artery without angina pectoris: Secondary | ICD-10-CM | POA: Diagnosis present

## 2012-04-02 DIAGNOSIS — Z951 Presence of aortocoronary bypass graft: Secondary | ICD-10-CM

## 2012-04-02 DIAGNOSIS — M549 Dorsalgia, unspecified: Secondary | ICD-10-CM | POA: Diagnosis present

## 2012-04-02 DIAGNOSIS — F1010 Alcohol abuse, uncomplicated: Secondary | ICD-10-CM | POA: Diagnosis present

## 2012-04-02 DIAGNOSIS — Z79899 Other long term (current) drug therapy: Secondary | ICD-10-CM

## 2012-04-02 DIAGNOSIS — E785 Hyperlipidemia, unspecified: Secondary | ICD-10-CM | POA: Diagnosis present

## 2012-04-02 DIAGNOSIS — Z23 Encounter for immunization: Secondary | ICD-10-CM

## 2012-04-02 DIAGNOSIS — G40909 Epilepsy, unspecified, not intractable, without status epilepticus: Secondary | ICD-10-CM | POA: Diagnosis not present

## 2012-04-02 DIAGNOSIS — R55 Syncope and collapse: Secondary | ICD-10-CM

## 2012-04-02 DIAGNOSIS — I10 Essential (primary) hypertension: Secondary | ICD-10-CM | POA: Diagnosis present

## 2012-04-02 DIAGNOSIS — R61 Generalized hyperhidrosis: Secondary | ICD-10-CM | POA: Diagnosis not present

## 2012-04-02 DIAGNOSIS — R079 Chest pain, unspecified: Secondary | ICD-10-CM | POA: Diagnosis not present

## 2012-04-02 DIAGNOSIS — R4182 Altered mental status, unspecified: Secondary | ICD-10-CM | POA: Diagnosis not present

## 2012-04-02 DIAGNOSIS — D72829 Elevated white blood cell count, unspecified: Secondary | ICD-10-CM | POA: Diagnosis present

## 2012-04-02 DIAGNOSIS — Z7982 Long term (current) use of aspirin: Secondary | ICD-10-CM

## 2012-04-02 DIAGNOSIS — R569 Unspecified convulsions: Secondary | ICD-10-CM | POA: Diagnosis not present

## 2012-04-02 DIAGNOSIS — I4892 Unspecified atrial flutter: Secondary | ICD-10-CM | POA: Diagnosis present

## 2012-04-02 DIAGNOSIS — H9190 Unspecified hearing loss, unspecified ear: Secondary | ICD-10-CM | POA: Diagnosis present

## 2012-04-02 DIAGNOSIS — G8929 Other chronic pain: Secondary | ICD-10-CM | POA: Diagnosis present

## 2012-04-02 DIAGNOSIS — R000 Tachycardia, unspecified: Secondary | ICD-10-CM | POA: Diagnosis not present

## 2012-04-02 HISTORY — DX: Gastro-esophageal reflux disease without esophagitis: K21.9

## 2012-04-02 HISTORY — DX: Unspecified osteoarthritis, unspecified site: M19.90

## 2012-04-02 HISTORY — DX: Headache: R51

## 2012-04-02 HISTORY — PX: LEFT HEART CATHETERIZATION WITH CORONARY ANGIOGRAM: SHX5451

## 2012-04-02 HISTORY — DX: Unspecified convulsions: R56.9

## 2012-04-02 HISTORY — DX: Sleep apnea, unspecified: G47.30

## 2012-04-02 LAB — CBC WITH DIFFERENTIAL/PLATELET
Basophils Absolute: 0 10*3/uL (ref 0.0–0.1)
Basophils Relative: 0 % (ref 0–1)
Lymphocytes Relative: 38 % (ref 12–46)
MCHC: 34.2 g/dL (ref 30.0–36.0)
Monocytes Absolute: 1.2 10*3/uL — ABNORMAL HIGH (ref 0.1–1.0)
Neutro Abs: 5.9 10*3/uL (ref 1.7–7.7)
Platelets: 209 10*3/uL (ref 150–400)
RDW: 14.8 % (ref 11.5–15.5)
WBC: 11.6 10*3/uL — ABNORMAL HIGH (ref 4.0–10.5)

## 2012-04-02 LAB — CARDIAC PANEL(CRET KIN+CKTOT+MB+TROPI)
CK, MB: 2.4 ng/mL (ref 0.3–4.0)
Relative Index: 0.8 (ref 0.0–2.5)
Total CK: 300 U/L — ABNORMAL HIGH (ref 7–232)
Troponin I: <0.3 ng/mL (ref <0.30)

## 2012-04-02 LAB — POCT I-STAT, CHEM 8
BUN: 13 mg/dL (ref 6–23)
Calcium, Ion: 1.13 mmol/L (ref 1.13–1.30)
Creatinine, Ser: 1.1 mg/dL (ref 0.50–1.35)
Glucose, Bld: 157 mg/dL — ABNORMAL HIGH (ref 70–99)
Sodium: 140 mEq/L (ref 135–145)
TCO2: 16 mmol/L (ref 0–100)

## 2012-04-02 LAB — CBC
HCT: 34.3 % — ABNORMAL LOW (ref 39.0–52.0)
Hemoglobin: 12.1 g/dL — ABNORMAL LOW (ref 13.0–17.0)
MCHC: 35.3 g/dL (ref 30.0–36.0)
MCV: 91.5 fL (ref 78.0–100.0)

## 2012-04-02 LAB — POCT I-STAT TROPONIN I

## 2012-04-02 LAB — CREATININE, SERUM
GFR calc Af Amer: >90 mL/min (ref >90)
GFR calc non Af Amer: 85 mL/min — ABNORMAL LOW (ref >90)

## 2012-04-02 SURGERY — LEFT HEART CATHETERIZATION WITH CORONARY ANGIOGRAM
Anesthesia: LOCAL

## 2012-04-02 MED ORDER — ASPIRIN 81 MG PO CHEW
324.0000 mg | CHEWABLE_TABLET | Freq: Once | ORAL | Status: AC
  Administered 2012-04-02: 324 mg via ORAL
  Filled 2012-04-02: qty 4

## 2012-04-02 MED ORDER — SUCRALFATE 1 G PO TABS
1.0000 g | ORAL_TABLET | Freq: Four times a day (QID) | ORAL | Status: DC
  Administered 2012-04-02 – 2012-04-04 (×6): 1 g via ORAL
  Filled 2012-04-02 (×9): qty 1

## 2012-04-02 MED ORDER — SUCRALFATE 1 G PO TABS: 1.0000 g | ORAL_TABLET | Freq: Four times a day (QID) | ORAL | Status: DC

## 2012-04-02 MED ORDER — ONDANSETRON HCL 4 MG PO TABS: 4.0000 mg | ORAL_TABLET | Freq: Four times a day (QID) | ORAL | Status: DC | PRN

## 2012-04-02 MED ORDER — ACETAMINOPHEN 325 MG PO TABS
650.0000 mg | ORAL_TABLET | Freq: Four times a day (QID) | ORAL | Status: DC | PRN
  Administered 2012-04-03 – 2012-04-04 (×4): 650 mg via ORAL
  Filled 2012-04-02 (×4): qty 2

## 2012-04-02 MED ORDER — ASPIRIN EC 81 MG PO TBEC
81.0000 mg | DELAYED_RELEASE_TABLET | Freq: Every day | ORAL | Status: DC
  Administered 2012-04-02 – 2012-04-04 (×3): 81 mg via ORAL
  Filled 2012-04-02 (×3): qty 1

## 2012-04-02 MED ORDER — ENOXAPARIN SODIUM 40 MG/0.4ML ~~LOC~~ SOLN
40.0000 mg | SUBCUTANEOUS | Status: DC
  Administered 2012-04-02 – 2012-04-03 (×2): 40 mg via SUBCUTANEOUS
  Filled 2012-04-02 (×3): qty 0.4

## 2012-04-02 MED ORDER — ASPIRIN 81 MG PO TABS: 81.0000 mg | ORAL_TABLET | Freq: Every day | ORAL | Status: DC

## 2012-04-02 MED ORDER — ONDANSETRON HCL 4 MG/2ML IJ SOLN: 4.0000 mg | Freq: Four times a day (QID) | INTRAMUSCULAR | Status: DC | PRN

## 2012-04-02 MED ORDER — SODIUM CHLORIDE 0.9 % IJ SOLN
3.0000 mL | Freq: Two times a day (BID) | INTRAMUSCULAR | Status: DC
  Administered 2012-04-02 – 2012-04-04 (×2): 3 mL via INTRAVENOUS

## 2012-04-02 MED ORDER — ATORVASTATIN CALCIUM 80 MG PO TABS
80.0000 mg | ORAL_TABLET | Freq: Every day | ORAL | Status: DC
  Administered 2012-04-02 – 2012-04-04 (×3): 80 mg via ORAL
  Filled 2012-04-02 (×3): qty 1

## 2012-04-02 MED ORDER — POTASSIUM CHLORIDE IN NACL 40-0.9 MEQ/L-% IV SOLN
INTRAVENOUS | Status: DC
  Administered 2012-04-02 – 2012-04-04 (×4): via INTRAVENOUS
  Filled 2012-04-02 (×5): qty 1000

## 2012-04-02 MED ORDER — ACETAMINOPHEN 650 MG RE SUPP: 650.0000 mg | Freq: Four times a day (QID) | RECTAL | Status: DC | PRN

## 2012-04-02 MED ORDER — LORAZEPAM 1 MG PO TABS: 1.0000 mg | ORAL_TABLET | Freq: Three times a day (TID) | ORAL | Status: DC | PRN

## 2012-04-02 MED ORDER — OXYCODONE HCL 5 MG PO TABS
5.0000 mg | ORAL_TABLET | Freq: Four times a day (QID) | ORAL | Status: DC | PRN
  Administered 2012-04-02 – 2012-04-04 (×5): 5 mg via ORAL
  Filled 2012-04-02 (×5): qty 1

## 2012-04-02 MED ORDER — NITROGLYCERIN 0.4 MG SL SUBL: 0.4000 mg | SUBLINGUAL_TABLET | SUBLINGUAL | Status: DC | PRN

## 2012-04-02 NOTE — ED Notes (Signed)
Talking with patient trying to get a phone number for his wife.

## 2012-04-02 NOTE — Progress Notes (Signed)
Chaplain Note:  Chaplain responded immediately to code STEMI page received at 17:44.  [facility] STEMI was soon cancelled, however, chaplain visited with pt and pt's spouse.  Pt was on trauma stretcher being treated by [facility] staff.  Pt was awake and aware but somewhat confused.  Chaplain assisted [facility] staff in helping to keep pt calm.  When pt's wife arrived, chaplain escorted her to the pt's bedside and continued to provided spiritual comfort and support.  At pt's spouse's request, chaplain prayed with pt and spouse.  Pt, family, and [facility] staff expressed appreciation for chaplain support. Chaplain will follow up as needed.  04/02/12 1745  Clinical Encounter Type  Visited With Patient and family together  Visit Type Spiritual support  Referral From Other (Comment) (Code STEMI page)  Spiritual Encounters  Spiritual Needs Emotional;Prayer  Stress Factors  Patient Stress Factors Health changes;Major life changes  Family Stress Factors Lack of knowledge;Loss of control;Major life changes   Jearld Lesch, chaplain resident [phone]

## 2012-04-02 NOTE — ED Notes (Signed)
Cardiologist in room with patient for assessment.  Patient is still having a hard time mentating.

## 2012-04-02 NOTE — H&P (Signed)
[facility] Admission History and Physical Service Pager: [phone]  Patient name: [name] Medical record number: [id number] Date of birth: August 22, 1946 Age: 66 y.o. Gender: male  Primary Care Provider: [name], MD  Chief Complaint: found unresponsive  Assessment and Plan: [name] is a 66 y.o. year old male with a history of seizures and cardiac disease presenting after being found unresponsive in his car. 1. Syncopal episode - the differential diagnosis includes seizure versus syncopal episode due orthostatic hypotension vs. Arrythmia. Pt has a remote history of seizure disorder (5 years ago) and took [place] for over one year. No loss of bowel/bladder, but does have bite marks on his tongue with some dried blood, which makes seizure seem the most likely cause of his loss of consciousness. Will admit to telemetry to monitor for arrhythmias, will also check cardiac enzymes x 3. Will also check orthostatic vital signs to see if this is due to orthostatic hypotension. Will likely call patient's Neurologist at [facility] or consult neurology tomorrow for seizure workup and possible EEG. 2. Leukocytosis - check clean catch urinalysis to evaluate for UTI.  CXR was negative for active disease. 3. Hypertension - BPs low upon presentation.  Will hold home antihypertensive medications.  Follow up orthostatics. 4. DM: Diet controlled.  Will continue to monitor. 5. CAD: status post CABG.  Patient denies any CP at this time.  POCT troponin negative.  Will cycle cardiac enzymes and monitor events on telemetry.  Continue ASA and statin. 6. Chronic back pain:  Will give Tylenol and oxycodone as needed for pain.  Patient takes Tylenol #3 at home. 7. History of alcohol abuse:  Patient denies any recent alcohol use.  Will continue Lorazepam.  Inpatient team to discuss whether or not to continue this upon D/C. 8. FEN/GI: heart healthy diet. NS with 31mq KCl @ 100  cc/hr. 9. Prophylaxis: Lovenox. 10. Disposition: pending neuro evaluation. 11. Code Status: Full code for now; need to address with patient.  History of Present Illness: Jacob ODLANDis a 66y.o. year old male presenting after being found unresponsive. This morning he woke up and felt fine. He went to the grocery store, and remembers driving there and parking but then lost consciousness and was found unresponsive by a bystander. He reports that there were several police cars stopped nearby, and he felt sensitive to the flashing blue lights. Per wife, there were no groceries in the car, indicating that he likely did not go in the store. He felt normal yesterday and had no dizziness. He denies losing control of his bowels or bladder. Denies hitting his head or any current focal weakness. He took all of his medications this morning.  Pt has a history of a seizure about five years ago. [facility], which he took for about 1.5 years. [facility] [place] When asked if he has a headache, states that he always has headaches and gets migraines.  Had a CABG 6-7 years ago, and is seen by Dr. Peter JMartiniqueof cardiology. Was put on metformin 1.5 years ago but hasn't taken in around six months because he says his labs were improved.  Has chronic back pain. Denies shortness of breath, change in appetite, constipation, diarrhea, blood in his stool, nausea, vomiting, or dysuria, but does endorse frequent urination. Wife notes  that he has had part of his colon removed so he has chronic loose stools, but not as much recently. No recent medication changes. No recent postural lightheadedness.   Per ED physician, patient was tachycardic with normal blood pressure, and EKG at 17:46 showed ST segment depression in v2-v6 with possible elevation in aVR. Cardiology was consulted but did not  believe pt needed to be taken to cath lab emergently. After getting fluids, a repeat EKG at 18:36 showed improvement in the previously noted abnormalities.  Patient Active Problem List  Diagnosis  . History of seizure disorder  . History of atrial flutter  . Depression  . Back pain  . Hearing loss  . Arrhythmia  . Diverticulitis  . H/O alcohol abuse  . CAD (coronary artery disease)  . HTN (hypertension)  . Diabetes mellitus   Past Medical History: Past Medical History  Diagnosis Date  . Coronary artery disease   . Hyperlipidemia   . Hypertension   . History of seizure disorder   . History of atrial flutter   . Depression   . Back pain     persistent  . Hearing loss   . Diverticulitis   . H/O alcohol abuse    Past Surgical History: Past Surgical History  Procedure Date  . Partial colectomy     for diverticuli  . Orchiectomy   . Umbilical hernia repair   . Cervical fusion   . Coronary artery bypass graft 2005    LIMA graft to LAD,saphenous vein graft to diag.,circumflex, marginal,and to the RCA  . Cardiac catheterization   . Appendectomy   . Esophagogastroduodenoscopy 02/10/2012    Procedure: ESOPHAGOGASTRODUODENOSCOPY (EGD);  Surgeon: Cleotis Nipper, MD;  Location: [facility] ENDOSCOPY;  Service: Endoscopy;  Laterality: N/A;  . Foreign body removal 02/10/2012    Procedure: FOREIGN BODY REMOVAL;  Surgeon: Cleotis Nipper, MD;  Location: [place];  Service: Endoscopy;  Laterality: N/A;   Social History: Occasional alcohol. No smoking history.  For any additional social history documentation, please refer to relevant sections of EMR.  Family History: No family history on file. Allergies: Allergies  Allergen Reactions  . Cephalexin     Unknown  . Doxycycline     Unknown  . Pentazocine Lactate     Unknown   No current facility-administered medications on file prior to encounter.   Current Outpatient Prescriptions on File Prior to Encounter  Medication Sig  Dispense Refill  . aspirin 81 MG tablet Take 81 mg by mouth daily.      Marland Kitchen atorvastatin (LIPITOR) 40 MG tablet Take 80 mg by mouth daily.       Marland Kitchen azelastine (ASTELIN) 137 MCG/SPRAY nasal spray 2 sprays into each nares twice a day  30 mL  12  . LORazepam (ATIVAN) 1 MG tablet Take 1 mg by mouth every 6 (six) hours as needed. For anxiety      . losartan-hydrochlorothiazide (HYZAAR) 100-25 MG per tablet Take 1 tablet by mouth daily.  30 tablet  5  . metoprolol (LOPRESSOR) 100 MG tablet Take 100 mg by mouth 2 (two) times daily.       . sucralfate (CARAFATE) 1 G tablet Take 1 tablet (1 g total) by mouth 4 (four) times daily. Ac and hs  120 tablet  0   Review Of Systems: Per HPI. Otherwise 12 point review of systems was performed and was unremarkable.  Physical Exam: BP 111/56  Pulse 99  Temp 98.8 F (37.1 C) (Oral)  Resp  16  SpO2 91% Exam: General: NAD HEENT: mucous membranes dry. Dried blood on his lips, there are bite marks on his tongue. Ear canals with moderate wax, no blood. Wears hearing aid in left ear. Cardiovascular: RRR, no m/r/g. Midline healed surgical scar. Respiratory: CTAB Abdomen: moderately distended, but nontender to palpation. No fluid wave. Extremities: lower extremities nontender, nonerythematous Skin: No visible rashes. Neuro: EOMI. Facial sensation intact. Tongue protrudes midline. SCM 5/5 bilaterally. Face symmetric. Speech intact. Strength 5/5 in all extremities.  Labs and Imaging: CBC BMET   Lab 04/02/12 1804 04/02/12 1744  WBC -- 11.6*  HGB 13.3 --  HCT 39.0 --  PLT -- 209    Lab 04/02/12 1804  NA 140  K 3.3*  CL 103  CO2 --  BUN 13  CREATININE 1.10  GLUCOSE 157*  CALCIUM --     Troponin x1 negative.  CT Head: No acute abnormality. 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  CXR: No acute findings.  Chrisandra Netters, MD 04/02/2012, 7:33 PM  Patient seen and examined with Dr.  Ardelia Mems.  Available data reviewed.  Briefly, this is a 66 year old male who presents with syncopal episode.  There were no witnesses.  A bystander found him passed out in his car and called EMS.  Currently, patient is awake, alert, and oriented.  Complains of acute on chronic back pain and headache, but denies any CP, SOB, nausea/vomiting, dizziness/lightheadedness.  Physical Exam: General: alert, awake, NAD HEENT: mucous membranes dry. Dried blood on his lips, there are bite marks on his tongue. Cardiovascular: RRR, no m/r/g. Midline surgical scar. Respiratory: CTAB Abdomen: moderately distended, but nontender to palpation. Not acute abdomen. Neuro: EOMI. Facial sensation intact. Tongue protrudes midline. SCM 5/5 bilaterally. Face symmetric. Speech intact. Strength 5/5 in all extremities.  Agree with findings, assessment, and plan as outlined above.  Because presentation is concerning for seizure activity, we plan on calling his neurologist in AM and may consider EEG in AM.  Will monitor on telemetry for cardiac arrythmia, cycle cardiac enzymes, and order orthostatic vitals.  Will hold anti-hypertensive medications for now since BP was low in ED.  Will order UA to rule out infection, CXR was negative.  PT/OT consults have been ordered.  [employer], DO 04/02/2012 9:32 PM

## 2012-04-02 NOTE — ED Notes (Signed)
Code stemi canceled.

## 2012-04-02 NOTE — ED Notes (Signed)
Old and new EKG handed to Dr Audie Pinto at bedside.  Extra copies of both placed in pt chart

## 2012-04-02 NOTE — ED Notes (Signed)
Wife at bedside.  MD to bedside.  Repeat EKG done per MD

## 2012-04-02 NOTE — ED Notes (Signed)
Pt states his wife works at home instead, he still does not remember how he came to ER.

## 2012-04-02 NOTE — ED Notes (Signed)
Pt to and from CT with RN.  Pt able to remember more things.  Pt aware that he is at [facility] and that his wife is coming from work.

## 2012-04-02 NOTE — ED Notes (Signed)
Pt found unresponsive in his running vehicle in a parking lot.

## 2012-04-02 NOTE — ED Notes (Signed)
Wife remains at bedside. Pastoral Care in room with patient and spouse.

## 2012-04-02 NOTE — ED Provider Notes (Signed)
History     CSN: [id number]  Arrival date & time 04/02/12  1731   First MD Initiated Contact with Patient 04/02/12 1742      No chief complaint on file.   (Consider location/radiation/quality/duration/timing/severity/associated sxs/prior treatment) Patient is a 66 y.o. male presenting with altered mental status. The history is provided by the EMS personnel. The history is limited by the condition of the patient.  Altered Mental Status This is a new problem. The current episode started today. The problem occurs constantly. The problem has been gradually improving. Pertinent negatives include no abdominal pain, nausea or vomiting. Nothing aggravates the symptoms. He has tried nothing for the symptoms.    Past Medical History  Diagnosis Date  . Coronary artery disease   . Hyperlipidemia   . Hypertension   . History of seizure disorder   . History of atrial flutter   . Depression   . Back pain     persistent  . Hearing loss   . Diverticulitis   . H/O alcohol abuse     Past Surgical History  Procedure Date  . Partial colectomy     for diverticuli  . Orchiectomy   . Umbilical hernia repair   . Cervical fusion   . Coronary artery bypass graft 2005    LIMA graft to LAD,saphenous vein graft to diag.,circumflex, marginal,and to the RCA  . Cardiac catheterization   . Appendectomy   . Esophagogastroduodenoscopy 02/10/2012    Procedure: ESOPHAGOGASTRODUODENOSCOPY (EGD);  Surgeon: Cleotis Nipper, MD;  Location: [facility] ENDOSCOPY;  Service: Endoscopy;  Laterality: N/A;  . Foreign body removal 02/10/2012    Procedure: FOREIGN BODY REMOVAL;  Surgeon: Cleotis Nipper, MD;  Location: [place];  Service: Endoscopy;  Laterality: N/A;    No family history on file.  History  Substance Use Topics  . Smoking status: Never Smoker   . Smokeless tobacco: Not on file  . Alcohol Use: Yes     rare beer      Review of Systems  Constitutional:       Feels hot  Respiratory: Positive for  chest tightness. Negative for shortness of breath.   Gastrointestinal: Negative for nausea, vomiting and abdominal pain.  Psychiatric/Behavioral: Positive for confusion and altered mental status.  All other systems reviewed and are negative.    Allergies  Cephalexin; Doxycycline; and Pentazocine lactate  Home Medications   Current Outpatient Rx  Name Route Sig Dispense Refill  . ASPIRIN 81 MG PO TABS Oral Take 81 mg by mouth daily.    . ATORVASTATIN CALCIUM 40 MG PO TABS Oral Take 40 mg by mouth daily.    . AZELASTINE HCL 137 MCG/SPRAY NA SOLN  2 sprays into each nares twice a day 30 mL 12  . LORAZEPAM 1 MG PO TABS Oral Take 1 mg by mouth every 6 (six) hours as needed.      Marland Kitchen LOSARTAN POTASSIUM-HCTZ 100-25 MG PO TABS Oral Take 1 tablet by mouth daily. 30 tablet 5  . METOPROLOL TARTRATE 100 MG PO TABS Oral Take 100 mg by mouth 2 (two) times daily.     . MOMETASONE FURO-FORMOTEROL FUM 100-5 MCG/ACT IN AERO Inhalation Inhale 2 puffs into the lungs 2 (two) times daily. 1 Inhaler 0    Pt will have free trial offer coupon  . NITROGLYCERIN 0.4 MG SL SUBL Sublingual Place 1 tablet (0.4 mg total) under the tongue every 5 (five) minutes as needed for chest pain. 25 tablet 11  . SUCRALFATE 1  G PO TABS Oral Take 1 tablet (1 g total) by mouth 4 (four) times daily. Ac and hs 120 tablet 0  . TRAZODONE HCL 100 MG PO TABS Oral Take 100 mg by mouth at bedtime.      Marland Kitchen ZOLPIDEM TARTRATE 10 MG PO TABS Oral Take 0.5 tablets (5 mg total) by mouth at bedtime as needed. 30 tablet 5    BP 122/69  Pulse 123  Temp 98.8 F (37.1 C) (Oral)  Resp 20  SpO2 95%  Physical Exam  Nursing note and vitals reviewed. Constitutional: He appears well-developed and well-nourished.  HENT:  Head: Normocephalic and atraumatic.  Right Ear: External ear normal.  Left Ear: External ear normal.  Nose: Nose normal.  Mouth/Throat:    Neck: Neck supple.  Cardiovascular: Regular rhythm, normal heart sounds and intact  distal pulses.  Tachycardia present.   Pulmonary/Chest: Effort normal.    Abdominal: Soft. He exhibits no distension. There is no tenderness.    Musculoskeletal: He exhibits no edema and no tenderness.  Lymphadenopathy:    He has no cervical adenopathy.  Neurological: He is alert. He has normal strength. He is disoriented. No cranial nerve deficit or sensory deficit. GCS eye subscore is 4. GCS verbal subscore is 4. GCS motor subscore is 6.  Skin: Skin is warm and dry.    ED Course  Procedures (including critical care time)  Labs Reviewed  CBC WITH DIFFERENTIAL - Abnormal; Notable for the following:    WBC 11.6 (*)     RBC 4.08 (*)     HCT 38.3 (*)     Lymphs Abs 4.4 (*)     Monocytes Absolute 1.2 (*)     All other components within normal limits  POCT I-STAT, CHEM 8 - Abnormal; Notable for the following:    Potassium 3.3 (*)     Glucose, Bld 157 (*)     All other components within normal limits  POCT I-STAT TROPONIN I   Ct Head Wo Contrast  04/02/2012  *RADIOLOGY REPORT*  Clinical Data: Altered mental status.  Dizziness.  History of seizures.  CT HEAD WITHOUT CONTRAST  Technique:  Contiguous axial images were obtained from the base of the skull through the vertex without contrast.  Comparison: 11/09/2006.  Findings: No significant change in enlargement of the ventricles and subarachnoid spaces.  Cavum septum pellucidum and vergae are again demonstrated.  Stable small, old right frontal lobe infarct. No significant change in bilateral basal ganglia and left caudate head lacunar infarcts.  No intracranial hemorrhage, mass lesion or CT evidence of acute infarction.  Bilateral cavernous internal carotid artery atheromatous calcifications.  IMPRESSION:  1.  No acute abnormality. 2.  Stable atrophy and old infarcts, as described above.  Original Report Authenticated By: Gerald Stabs, M.D.   Dg Chest Portable 1 View  04/02/2012  *RADIOLOGY REPORT*  Clinical Data: Syncope.  Tachycardia.   Coronary artery disease.  PORTABLE CHEST - 1 VIEW  Comparison: 10/23/2011  Findings: Heart size is normal.  Both lungs are clear.  Prior CABG again noted.  IMPRESSION: No acute findings.  Original Report Authenticated By: Marlaine Hind, M.D.     Date: 04/02/2012  Rate: 118  Rhythm: sinus tachycardia  QRS Axis: normal  Intervals: normal  ST/T Wave abnormalities: ST depressions inferiorly and ST depressions anteriorly  Conduction Disutrbances:none  Narrative Interpretation:   Old EKG Reviewed: changes noted   Date: 04/02/2012  Rate: 99  Rhythm: normal sinus rhythm  QRS Axis: normal  Intervals: normal  ST/T Wave abnormalities: nonspecific ST/T changes and ST depressions resolved  Conduction Disutrbances:none  Narrative Interpretation:   Old EKG Reviewed: changes noted     1. Syncope       MDM  66 yo male found in his locked car unconscious by EMS. Patient agitated/combative on EMS dock, now slowly regaining consciousness and becoming less confused. Originally disoriented without any recollection. Hx of 1 seizure several years ago, unclear if he had again today. Tachycardic to 130s on arrival with ST depressions. Cards consulted, they believe this is rate dependent. Repeat EKG shows improvement with slower HR. Has mild tongue abrasion from possible tongue biting/seizure. Labs and head CT neg, no c/o chest pain or dyspnea here. No headache. Doubt SAH or stroke. Syncope vs seizure. Based on his PMH, will admit to family medicine for telemetry monitoring and syncope workup and ACS r/o.         Sherwood Gambler, MD 04/02/12 2028

## 2012-04-03 ENCOUNTER — Inpatient Hospital Stay (HOSPITAL_COMMUNITY): Payer: Medicare Other

## 2012-04-03 ENCOUNTER — Encounter (HOSPITAL_COMMUNITY): Payer: Self-pay | Admitting: General Practice

## 2012-04-03 DIAGNOSIS — E119 Type 2 diabetes mellitus without complications: Secondary | ICD-10-CM

## 2012-04-03 DIAGNOSIS — R55 Syncope and collapse: Secondary | ICD-10-CM

## 2012-04-03 DIAGNOSIS — I10 Essential (primary) hypertension: Secondary | ICD-10-CM

## 2012-04-03 DIAGNOSIS — R569 Unspecified convulsions: Secondary | ICD-10-CM

## 2012-04-03 DIAGNOSIS — I2510 Atherosclerotic heart disease of native coronary artery without angina pectoris: Secondary | ICD-10-CM

## 2012-04-03 LAB — BASIC METABOLIC PANEL
CO2: 25 mEq/L (ref 19–32)
Calcium: 8.8 mg/dL (ref 8.4–10.5)
GFR calc Af Amer: 88 mL/min — ABNORMAL LOW (ref >90)
GFR calc non Af Amer: 76 mL/min — ABNORMAL LOW (ref >90)
Sodium: 141 mEq/L (ref 135–145)

## 2012-04-03 LAB — GRAM STAIN: Special Requests: NORMAL

## 2012-04-03 LAB — CBC
Platelets: 176 10*3/uL (ref 150–400)
RBC: 3.64 MIL/uL — ABNORMAL LOW (ref 4.22–5.81)
RDW: 15.4 % (ref 11.5–15.5)
WBC: 9.2 10*3/uL (ref 4.0–10.5)

## 2012-04-03 LAB — URINE MICROSCOPIC-ADD ON

## 2012-04-03 LAB — URINALYSIS, ROUTINE W REFLEX MICROSCOPIC
Bilirubin Urine: NEGATIVE
Hgb urine dipstick: NEGATIVE
Specific Gravity, Urine: 1.021 (ref 1.005–1.030)
pH: 5.5 (ref 5.0–8.0)

## 2012-04-03 LAB — CARDIAC PANEL(CRET KIN+CKTOT+MB+TROPI)
CK, MB: 2.1 ng/mL (ref 0.3–4.0)
Total CK: 459 U/L — ABNORMAL HIGH (ref 7–232)

## 2012-04-03 MED ORDER — LOSARTAN POTASSIUM 50 MG PO TABS
100.0000 mg | ORAL_TABLET | Freq: Every day | ORAL | Status: DC
  Administered 2012-04-03 – 2012-04-04 (×2): 100 mg via ORAL
  Filled 2012-04-03 (×2): qty 2

## 2012-04-03 MED ORDER — LOSARTAN POTASSIUM-HCTZ 100-25 MG PO TABS: 1.0000 | ORAL_TABLET | Freq: Every day | ORAL | Status: DC

## 2012-04-03 MED ORDER — LEVETIRACETAM 750 MG PO TABS
750.0000 mg | ORAL_TABLET | Freq: Two times a day (BID) | ORAL | Status: DC
  Administered 2012-04-03 – 2012-04-04 (×3): 750 mg via ORAL
  Filled 2012-04-03 (×4): qty 1

## 2012-04-03 MED ORDER — PNEUMOCOCCAL VAC POLYVALENT 25 MCG/0.5ML IJ INJ
0.5000 mL | INJECTION | Freq: Once | INTRAMUSCULAR | Status: AC
  Administered 2012-04-03: 0.5 mL via INTRAMUSCULAR
  Filled 2012-04-03: qty 0.5

## 2012-04-03 MED ORDER — HYDROCHLOROTHIAZIDE 25 MG PO TABS
25.0000 mg | ORAL_TABLET | Freq: Every day | ORAL | Status: DC
  Administered 2012-04-03 – 2012-04-04 (×2): 25 mg via ORAL
  Filled 2012-04-03 (×2): qty 1

## 2012-04-03 MED ORDER — METOPROLOL TARTRATE 100 MG PO TABS
100.0000 mg | ORAL_TABLET | Freq: Two times a day (BID) | ORAL | Status: DC
  Administered 2012-04-03 – 2012-04-04 (×3): 100 mg via ORAL
  Filled 2012-04-03 (×4): qty 1

## 2012-04-03 NOTE — Progress Notes (Signed)
Family Medicine Teaching Service Daily Progress Note Service Page: [phone]  Patient Assessment:  66 y.o. year old male with a history of seizures and cardiac disease presenting after being found unresponsive in his car  Subjective: Pt reports that his feeling well, feels like his normal self. Denies chest pain, palpitations, shortness of breath, nausea, vomiting, dizziness, or a new headache. Does still have a headache but says it has chronically been present and is unchanged. Also has back pain, which is chronic for him. Asked pt about whether he takes nitroglycerin at home and he says he has never had to take it. Also says his abdomen is not more distended than normal right now.  Objective: Temp:  [98.3 F (36.8 C)-99.1 F (37.3 C)] 98.3 F (36.8 C) (08/09 0514) Pulse Rate:  [85-135] 88  (08/09 0517) Resp:  [16-32] 17  (08/09 0517) BP: (102-142)/(50-86) 141/80 mmHg (08/09 0517) SpO2:  [91 %-97 %] 96 % (08/09 0517) Weight:  [200 lb 12.8 oz (91.082 kg)] 200 lb 12.8 oz (91.082 kg) (08/08 2100) Exam: General: NAD, resting comfortably in bed Cardiovascular: RRR Respiratory: CTAB via lateral auscultation Abdomen: moderately distended similar to last night, at baseline per pt, nontender Extremities: no LE edema, erythema, or tenderness to palpation.  I have reviewed the patient's medications, labs, imaging, and diagnostic testing.  Notable results are summarized below.  CBC BMET   Lab 04/03/12 0500 04/02/12 2135 04/02/12 1804 04/02/12 1744  WBC 9.2 11.9* -- 11.6*  HGB 11.8* 12.1* 13.3 --  HCT 34.0* 34.3* 39.0 --  PLT 176 182 -- 209    Lab 04/03/12 0500 04/02/12 2135 04/02/12 1804  NA 141 -- 140  K 3.3* -- 3.3*  CL 106 -- 103  CO2 25 -- --  BUN 13 -- 13  CREATININE 1.00 0.94 1.10  GLUCOSE 101* -- 157*  CALCIUM 8.8 -- --     Urinalysis    Component Value Date/Time   COLORURINE YELLOW 04/03/2012 0025   APPEARANCEUR CLOUDY* 04/03/2012 0025   LABSPEC 1.021 04/03/2012 0025   PHURINE 5.5 04/03/2012 0025   GLUCOSEU NEGATIVE 04/03/2012 0025   HGBUR NEGATIVE 04/03/2012 0025   BILIRUBINUR NEGATIVE 04/03/2012 0025   KETONESUR 15* 04/03/2012 0025   PROTEINUR 30* 04/03/2012 0025   UROBILINOGEN 0.2 04/03/2012 0025   NITRITE NEGATIVE 04/03/2012 0025   LEUKOCYTESUR NEGATIVE 04/03/2012 0025    Imaging/Diagnostic Tests: No telemetry events overnight (NSR) EEG done; read pending Urine Drug screen pending; gram stain neg Orthostatic vitals negative. Cardiac enzymes negative x 3  Plan: [name] is a 66 y.o. year old male with a history of seizures and cardiac disease presenting after being found unresponsive in his car.   1. Syncopal episode - Ddx includes seizure, cardiogenic syncope (arrythmia), orthostatic syncope. Cardiogenic and orthostatic less likely as cardiac enzymes x3 negative, no telemetry events, normal EKG this morning, and negative orthostatic vitals. More likely is a seizure, given hx of seizure disorder, and patient bit his tongue.   - Spoke with Dr. Creig Hines of [facility] Neurology this morning, recommended starting Keppra 720m BID. Also recommended obtaining EEG. Have ordered both of those.   - Patient could possibly go home today, but given EKG changes on presentation and the fact that a code STEMI was called when patient was in ED, will keep patient overnight for an additional period of monitoring just to be sure he does not have telemetry events or arrhythmias that could have contributed to this syncopal episode.  2. Leukocytosis -  improved to 9.2 this morning; likely reactional from possible seizure. UA was checked and was not concerning for infection; gram stain negative.  3. HTN - Held home BP meds last night as pt was slightly hypotensive; have trended up today so will restart now.  4. DM - diet controled. Heart healthy diet.  5. CAD - cardiac enzymes negative x 3. No tele events overnight. Continue ASA and statin.  6. Chronic back pain - tylenol &  oxycodone prn pain.  7. History of alcohol abuse - continue lorazepam to avoid withdrawal. Pt on CIWA protocol just in case, but likely will not require any withdrawal medications.  8. FEN/GI: NS with 59mq KCl @ 100 cc/hr; heart healthy diet  9. Prophylaxis - lovenox  10. Disposition - likely to home tomorrow after being monitored on telemetry overnight. Patient has follow-up appointment scheduled with Dr. MErmalene Postin(neurology) on Wednesday, August 14 at 11:00am. [facility]. DEverlene Farrier.  11. Code status - full code, need to address specific wishes with patient  MChrisandra Netters MD 04/03/2012, 8:06 AM

## 2012-04-03 NOTE — Progress Notes (Signed)
I interviewed and examined this patient and discussed the care plan with Dr. Ardelia Mems and the [facility] team and agree with assessment and plan as documented in the progress note for today.    [name], [place] Service Attending  04/03/2012 9:12 PM

## 2012-04-03 NOTE — Progress Notes (Signed)
History: 66 yo M with a history of seizure who presents with syncope vs seizure.   Sedation: None  Technical Details: This was a 16 channel EEG study  Background: There is a well defined posterior dominant rhythm of 10 Hz that attenuates with eye opening. There are prominent central rhythms of 6 - 7hz  with a shifting predominance which I feel are benign in nature.    EEG Diagnosis: 1) Normal Awake and Drowsy EEG  Clinical Interpretation: There was no evidence of seizure or seizure predisposition on this study.

## 2012-04-03 NOTE — Evaluation (Signed)
Occupational Therapy Evaluation Patient Details Name: [name] MRN: [id number] DOB: [birth year] Today's Date: 04/03/2012 Time: 0815-0829 OT Time Calculation (min): 14 min  OT Assessment / Plan / Recommendation Clinical Impression  Pt admitted s/p found unconscious in car at grocery yesterday- possibly secondary to seizures. Pt presents at baseline function- limited only by chronic low back pain. No further acute OT indicated at this time. Will sign off    OT Assessment  Patient does not need any further OT services    Follow Up Recommendations  No OT follow up    Barriers to Discharge      Equipment Recommendations  None recommended by OT    Recommendations for Other Services    Frequency       Precautions / Restrictions Precautions Precautions: None Restrictions Weight Bearing Restrictions: No   Pertinent Vitals/Pain Pt reports low back pain with activity (states he is to have back sx "soon") but did not rate pain. Pt repositioned.    ADL  Grooming: Performed;Independent Where Assessed - Grooming: Unsupported standing Upper Body Bathing: Simulated;Independent Where Assessed - Upper Body Bathing: Unsupported standing Lower Body Bathing: Simulated;Independent Where Assessed - Lower Body Bathing: Unsupported standing Lower Body Dressing: Performed;Independent Where Assessed - Lower Body Dressing: Unsupported sit to stand Toilet Transfer: Simulated;Independent Toilet Transfer Method: Sit to Loss adjuster, chartered:  (from bed) Toileting - Clothing Manipulation and Hygiene: Simulated;Independent Where Assessed - Toileting Clothing Manipulation and Hygiene: Standing Tub/Shower Transfer: Recruitment consultant Method: Ambulating Equipment Used: Gait belt Transfers/Ambulation Related to ADLs: Pt I with ambulation throughout room, into hall and back.  ADL Comments: Pt at baseline functioning    OT Diagnosis:    OT Problem List:   OT  Treatment Interventions:     OT Goals    Visit Information  Last OT Received On: 04/03/12 Assistance Needed: +1    Subjective Data  Subjective: The only thing that bothers me are blinking lights Patient Stated Goal: Return home   Prior Functioning  Vision/Perception  Home Living Lives With: Spouse Available Help at Discharge: Family;Available PRN/intermittently Type of Home: House Home Access: Stairs to enter [employer] of Steps: 3 Entrance Stairs-Rails: Can reach both Home Layout: Two level (split level) Alternate Level Stairs-Number of Steps: 3 Alternate Level Stairs-Rails: Can reach both Bathroom Shower/Tub: Chiropodist: Standard Home Adaptive Equipment: Bedside commode/3-in-1;Shower chair with back Prior Function Level of Independence: Independent Able to Take Stairs?: Reciprically Driving: Yes Vocation: Retired Comments: pt with "back issues" and is scheduled to have sx "soon" per his report Communication Communication: No difficulties Dominant Hand: Right   Vision - Assessment Additional Comments: pt reports no changes or issues with vision. However, he does report sensitivity to "flashing" lights and believes this is what brought on his episode yesterday  Cognition  Overall Cognitive Status: Appears within functional limits for tasks assessed/performed Arousal/Alertness: Awake/alert Orientation Level: Appears intact for tasks assessed Behavior During Session: [facility] for tasks performed    Extremity/Trunk Assessment Right Upper Extremity Assessment RUE ROM/Strength/Tone: Within functional levels RUE Sensation: WFL - Light Touch RUE Coordination: WFL - gross/fine motor Left Upper Extremity Assessment LUE ROM/Strength/Tone: Within functional levels LUE Sensation: WFL - Light Touch LUE Coordination: WFL - gross/fine motor   Mobility Bed Mobility Bed Mobility: Supine to Sit;Sit to Supine Supine to Sit: 6: Modified independent  (Device/Increase time);With rails;HOB flat Sit to Supine: 6: Modified independent (Device/Increase time);With rail;HOB flat Transfers Transfers: Sit to Stand;Stand to Sit Sit to Stand: 7:  Independent;From bed Stand to Sit: 7: Independent;To bed   Exercise    Balance    End of Session OT - End of Session Equipment Utilized During Treatment: Gait belt Activity Tolerance: Patient limited by pain ((in low back)) Patient left: in bed;with call bell/phone within reach;with family/visitor present Nurse Communication: Mobility status  GO     Drayk Humbarger 04/03/2012, 9:27 AM

## 2012-04-03 NOTE — Evaluation (Signed)
Physical Therapy Evaluation Patient Details Name: [name] MRN: [id number] DOB: [birth year] Today's Date: 04/03/2012 Time: 7026-3785 PT Time Calculation (min): 18 min  PT Assessment / Plan / Recommendation Clinical Impression  Patient is a 66 yo male admitted with seizures and history of back pain.  Patient was independent with all mobility and gait.  Good balance.  No acute PT needs identified - PT will sign off.  No f/u PT or equipment needed.    PT Assessment  Patent does not need any further PT services    Follow Up Recommendations  No PT follow up;Supervision - Intermittent    Barriers to Discharge        Equipment Recommendations  None recommended by PT    Recommendations for Other Services     Frequency      Precautions / Restrictions Precautions Precautions: None Restrictions Weight Bearing Restrictions: No         Mobility  Bed Mobility Bed Mobility: Supine to Sit;Sit to Supine Supine to Sit: 6: Modified independent (Device/Increase time);With rails;HOB flat Sit to Supine: 6: Modified independent (Device/Increase time);With rail;HOB flat Details for Bed Mobility Assistance: Used rails to move to sitting.  No physical assist needed Transfers Transfers: Sit to Stand;Stand to Sit Sit to Stand: 7: Independent;With upper extremity assist;From bed Stand to Sit: 7: Independent;With upper extremity assist;With armrests;To chair/3-in-1 Details for Transfer Assistance: Cues for proper hand placement for safety when sitting in chair. Ambulation/Gait Ambulation/Gait Assistance: 7: Independent Ambulation Distance (Feet): 200 Feet Assistive device: None Ambulation/Gait Assistance Details: Gait WFL - good balance, gait pattern WFL. Gait Pattern: Within Functional Limits Gait velocity: WFL      PT Goals  N/A  Visit Information  Last PT Received On: 04/03/12 Assistance Needed: +1    Subjective Data  Subjective: Do you think I can leave today?  Patient Stated  Goal: To return home soon   Prior [place] Lives With: Spouse Available Help at Discharge: Family;Available PRN/intermittently Type of Home: House Home Access: Stairs to enter [employer] of Steps: flight to enter through basement Entrance Stairs-Rails: Right;Left Home Layout: Two level Alternate Level Stairs-Number of Steps: 3 Alternate Level Stairs-Rails: Can reach both Bathroom Shower/Tub: Chiropodist: Standard Home Adaptive Equipment: Bedside commode/3-in-1;Shower chair with back Prior Function Level of Independence: Independent Able to Take Stairs?: Reciprically Driving: Yes Vocation: Retired Comments: Wife works Corporate investment banker: HOH (Wears hearing aid) Dominant Hand: Right    Cognition  Overall Cognitive Status: Appears within functional limits for tasks assessed/performed Arousal/Alertness: Awake/alert Orientation Level: Oriented X4 / Intact Behavior During Session: WFL for tasks performed    Extremity/Trunk Assessment Right Upper Extremity Assessment RUE ROM/Strength/Tone: Within functional levels RUE Sensation: WFL - Light Touch RUE Coordination: WFL - gross/fine motor Left Upper Extremity Assessment LUE ROM/Strength/Tone: Within functional levels LUE Sensation: WFL - Light Touch LUE Coordination: WFL - gross/fine motor Right Lower Extremity Assessment RLE ROM/Strength/Tone: WFL for tasks assessed Left Lower Extremity Assessment LLE ROM/Strength/Tone: WFL for tasks assessed   Balance Balance Balance Assessed: Yes High Level Balance High Level Balance Activites: Turns;Head turns High Level Balance Comments: No loss of balance with high level balance activities  End of Session PT - End of Session Equipment Utilized During Treatment: Gait belt Activity Tolerance: Patient tolerated treatment well Patient left: in chair;with call bell/phone within reach Nurse Communication: Mobility status  GP      Despina Pole 04/03/2012, 12:59 PM Carita Pian. Rosana Hoes, PT, [facility]  Pager [phone]

## 2012-04-03 NOTE — Progress Notes (Signed)
Utilization Review Completed.  

## 2012-04-03 NOTE — H&P (Signed)
Family Medicine Teaching Service Attending Note  I interviewed and examined patient [name] and reviewed their tests and x-rays.  I discussed with Dr. Francesco Sor and reviewed their note for today.  I agree with their assessment and plan.     Additionally  Presentation very consistent with seizure.  No signs of CAD or arrythmia.   Would contact his neurologist to restart antisz medication.   Consider cheaper alternative to keppra ? Lamictal.

## 2012-04-03 NOTE — Progress Notes (Signed)
Pt transferred from the ED around 2030, admitted to Rm 6741. Pt comes from home with wife. He is alert and orientedx4. Ambulatory with standby assist. Old healing scab to Left shin, no drainage or bleeding, left OTA. Placed on telemetry. Oriented to room, instructed to call for assistance before getting out of bed. Resting comfortably at this time, will continue to monitor

## 2012-04-03 NOTE — Progress Notes (Signed)
Routine EEG completed.  

## 2012-04-04 DIAGNOSIS — R569 Unspecified convulsions: Secondary | ICD-10-CM | POA: Diagnosis not present

## 2012-04-04 DIAGNOSIS — I10 Essential (primary) hypertension: Secondary | ICD-10-CM | POA: Diagnosis not present

## 2012-04-04 DIAGNOSIS — E119 Type 2 diabetes mellitus without complications: Secondary | ICD-10-CM | POA: Diagnosis not present

## 2012-04-04 DIAGNOSIS — I2510 Atherosclerotic heart disease of native coronary artery without angina pectoris: Secondary | ICD-10-CM | POA: Diagnosis not present

## 2012-04-04 DIAGNOSIS — G40909 Epilepsy, unspecified, not intractable, without status epilepticus: Secondary | ICD-10-CM | POA: Diagnosis present

## 2012-04-04 LAB — URINE DRUGS OF ABUSE SCREEN W ALC, ROUTINE (REF LAB)
Barbiturate Quant, Ur: NEGATIVE
Creatinine,U: 113.2 mg/dL
Ethyl Alcohol: <10 mg/dL (ref <10)
Marijuana Metabolite: NEGATIVE
Methadone: NEGATIVE
Phencyclidine (PCP): NEGATIVE

## 2012-04-04 MED ORDER — LEVETIRACETAM 750 MG PO TABS: 750.0000 mg | ORAL_TABLET | Freq: Two times a day (BID) | ORAL | Status: DC

## 2012-04-04 NOTE — Progress Notes (Signed)
Patient discharge home, discharge instruction given and explained.  Patient voiced understanding of all instructions.

## 2012-04-04 NOTE — Progress Notes (Signed)
I interviewed and examined this patient and discussed the care plan with Dr. Maricela Bo and the [facility] team and agree with assessment and plan as documented in the progress note for today. He continues to feel back to normal.    Wakisha Alberts A. Walker Kehr, [place] Service Attending  04/04/2012 4:54 PM
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
## 2012-04-04 NOTE — Discharge Summary (Addendum)
Physician Discharge Summary  Patient ID: [name] MRN: [id number] DOB/AGE: 68-Apr-1947 68 y.o.  Admit date: 04/02/2012 Discharge date: 04/04/2012  Admission Diagnoses: Loss of consciousness   Discharge Diagnoses:  Principal Problem:  *Seizure disorder Active Problems:  CAD (coronary artery disease) Hypertension Diet controlled diabetes History of alcohol abuse Chronic back pain  Discharged Condition: stable  Hospital Course:   [name] is a 66 y.o. year old male with a history of seizures (not on any anti-seizure medications)  and cardiac disease who presented after being found unresponsive in his car.   1. Syncopal episode - It is likely that this syncopal episode was in fact due to a seizure, as the patient had bite marks on his tongue upon presentation, however the differential diagnosis included seizure, cardiogenic syncope (arrythmia), and orthostatic syncope. In ED, patient had an EKG which initially showed ST depression in leads v2-v6 and possible ST elevation in aVR. Cardiology was consulted by ED physician, but did not feel that the patient required emergent catheterization. Pt received fluids in ED and a repeat EKG approximately one hour later showed resolution of the ST segment changes. Pt was admitted to telemetry and monitored for any cardiac events. We obtained three sets of cardiac enzymes (which were negative) and monitored patient on telemetry for arrhythmias. Orthostatic vitals were negative. We spoke with patient's outpatient neurologist, Dr. Creig Hines, who recommended checking a urine drug screen and an EEG. EEG was obtained the morning after admission, and was negative for seizure activity. A repeat 12 lead EKG on 04/03/2012 was normal. Per Dr. Conrad Clayton recommendation, pt was started on Keppra 729m BID. Pt will follow up with Dr. JMartiniqueas an outpatient.  2. Leukocytosis - Pt had a slight leukocytosis upon admission (11.9 but improved to 9.2 the day following  admission) which was believed to be reactional from possible seizure. UA was checked and was not concerning for infection; urine gram stain negative.   3. HTN - Pt hypotensive in ED, held BP meds upon arrival, but restarted them as BP's began to trend up.  4. DM -Diet controlled; stable during admission.   5. CAD - cardiac enzymes were negative x 3. Monitored on telemetry. Pt was given home aspirin and statin.  6. Chronic back pain - Was given tylenol & oxycodone prn for pain.   7. History of alcohol abuse - Continued lorazepam to avoid withdrawal. CIWA protocol was implemented in case of withdrawal, but pt did not show signs of withdrawal while hospitalized.  8. Prophylaxis - was given Lovenox.  Consults: none  Significant Diagnostic Studies:  Urine drug screen - positive only for opiates (negative for amphetamines, marijuana, barbiturates, methadone, propyxphene, benzos, PCP, cocaine, ethyl alcohol)  EEG  Sedation: None  Technical Details: This was a 16 channel EEG study  Background: There is a well defined posterior dominant rhythm of 10 Hz that attenuates with eye opening. There are prominent central rhythms of 6 - 7hz  with a shifting predominance which I feel are benign in nature.  EEG Diagnosis: 1) Normal Awake and Drowsy EEG  Clinical Interpretation: There was no evidence of seizure or seizure predisposition on this study.    Basic Metabolic Panel:    Component Value Date/Time   NA 141 04/03/2012 0500   K 3.3* 04/03/2012 0500   CL 106 04/03/2012 0500   CO2 25 04/03/2012 0500   BUN 13 04/03/2012 0500   CREATININE 1.00 04/03/2012 0500   GLUCOSE 101* 04/03/2012 0500   CALCIUM  8.8 04/03/2012 0500   CBC:    Component Value Date/Time   WBC 9.2 04/03/2012 0500   WBC 8.6 10/23/2011 1315   HGB 11.8* 04/03/2012 0500   HGB 14.9 10/23/2011 1315   HCT 34.0* 04/03/2012 0500   HCT 46.5 10/23/2011 1315   PLT 176 04/03/2012 0500   MCV 93.4 04/03/2012 0500   MCV 95.5 10/23/2011 1315   NEUTROABS 5.9  04/02/2012 1744   LYMPHSABS 4.4* 04/02/2012 1744   MONOABS 1.2* 04/02/2012 1744   EOSABS 0.1 04/02/2012 1744   BASOSABS 0.0 04/02/2012 1744   Urinalysis:    Component Value Date/Time   COLORURINE YELLOW 04/03/2012 0025   APPEARANCEUR CLOUDY* 04/03/2012 0025   LABSPEC 1.021 04/03/2012 0025   PHURINE 5.5 04/03/2012 0025   GLUCOSEU NEGATIVE 04/03/2012 0025   HGBUR NEGATIVE 04/03/2012 0025   BILIRUBINUR NEGATIVE 04/03/2012 0025   KETONESUR 15* 04/03/2012 0025   PROTEINUR 30* 04/03/2012 0025   UROBILINOGEN 0.2 04/03/2012 0025   NITRITE NEGATIVE 04/03/2012 0025   LEUKOCYTESUR NEGATIVE 04/03/2012 0025    Disposition: 01-Home or Self Care   Medication List  As of 04/07/2012  6:01 AM   TAKE these medications         aspirin 81 MG tablet   Take 81 mg by mouth daily.      atorvastatin 40 MG tablet   Commonly known as: LIPITOR   Take 80 mg by mouth daily.      azelastine 137 MCG/SPRAY nasal spray   Commonly known as: ASTELIN   2 sprays into each nares twice a day      levETIRAcetam 750 MG tablet   Commonly known as: KEPPRA   Take 1 tablet (750 mg total) by mouth 2 (two) times daily.      LORazepam 1 MG tablet   Commonly known as: ATIVAN   Take 1 mg by mouth every 6 (six) hours as needed. For anxiety      losartan-hydrochlorothiazide 100-25 MG per tablet   Commonly known as: HYZAAR   Take 1 tablet by mouth daily.      metoprolol 100 MG tablet   Commonly known as: LOPRESSOR   Take 100 mg by mouth 2 (two) times daily.      nitroGLYCERIN 0.4 MG SL tablet   Commonly known as: NITROSTAT   Place 0.4 mg under the tongue every 5 (five) minutes as needed. For chest pain      sucralfate 1 G tablet   Commonly known as: CARAFATE   Take 1 tablet (1 g total) by mouth 4 (four) times daily. Ac and hs           Follow-up Information    Follow up with Dr. Ermalene Postin (Neurology) on 04/08/2012. (at 11:00 am)    Contact information:   [phone]      Follow up with DAUB, Lina Sayre, MD. (Go see Dr. [name] for a  hospital follow up within one week.)    Contact information:   [place] [phone]          Signed: Chrisandra Netters 04/07/2012, 6:01 AM

## 2012-04-07 NOTE — Discharge Summary (Signed)
I interviewed and examined this patient and discussed the care plan with Dr. Ardelia Mems and the [facility] team and agree with assessment and plan as documented in the discharge note.    [place] A. Walker Kehr, MD Family Medicine Teaching Service Attending  04/07/2012 10:34 AM

## 2012-04-08 DIAGNOSIS — G40209 Localization-related (focal) (partial) symptomatic epilepsy and epileptic syndromes with complex partial seizures, not intractable, without status epilepticus: Secondary | ICD-10-CM | POA: Diagnosis not present

## 2012-04-08 DIAGNOSIS — R404 Transient alteration of awareness: Secondary | ICD-10-CM | POA: Diagnosis not present

## 2012-04-08 DIAGNOSIS — G4700 Insomnia, unspecified: Secondary | ICD-10-CM | POA: Diagnosis not present

## 2012-04-08 DIAGNOSIS — G4730 Sleep apnea, unspecified: Secondary | ICD-10-CM | POA: Diagnosis not present

## 2012-04-08 LAB — OPIATE, QUANTITATIVE, URINE
Codeine Urine: NEGATIVE ng/mL
Hydrocodone: NEGATIVE ng/mL
Hydromorphone GC/MS Conf: NEGATIVE ng/mL
Oxymorphone: 188 ng/mL — ABNORMAL HIGH

## 2012-04-08 NOTE — ED Provider Notes (Signed)
I saw and evaluated the patient, reviewed the resident's note and I agree with the findings and plan.   .Face to face Exam:  General:  Awake HEENT:  Atraumatic Resp:  Normal effort Abd:  Nondistended Neuro:No focal weakness Lymph: No adenopathy   Dot Lanes, MD 04/08/12 [phone]

## 2012-04-08 NOTE — Discharge Summary (Signed)
I interviewed and examined this patient and discussed the care plan with Dr. Ardelia Mems and the [facility] team and agree with assessment and plan as documented in the discharge note.    [place] A. Walker Kehr, [place] Service Attending  04/08/2012 1:29 PM

## 2012-04-09 ENCOUNTER — Ambulatory Visit (INDEPENDENT_AMBULATORY_CARE_PROVIDER_SITE_OTHER): Payer: Medicare Other | Admitting: Emergency Medicine

## 2012-04-09 ENCOUNTER — Ambulatory Visit: Payer: Medicare Other

## 2012-04-09 VITALS — BP 104/72 | HR 83 | Temp 97.7°F | Resp 18 | Ht 65.0 in | Wt 205.0 lb

## 2012-04-09 DIAGNOSIS — Z125 Encounter for screening for malignant neoplasm of prostate: Secondary | ICD-10-CM | POA: Diagnosis not present

## 2012-04-09 DIAGNOSIS — R0781 Pleurodynia: Secondary | ICD-10-CM

## 2012-04-09 DIAGNOSIS — M549 Dorsalgia, unspecified: Secondary | ICD-10-CM

## 2012-04-09 DIAGNOSIS — Z139 Encounter for screening, unspecified: Secondary | ICD-10-CM

## 2012-04-09 DIAGNOSIS — R079 Chest pain, unspecified: Secondary | ICD-10-CM

## 2012-04-09 LAB — POCT URINALYSIS DIPSTICK
Blood, UA: NEGATIVE
Nitrite, UA: NEGATIVE
Protein, UA: 30
Spec Grav, UA: 1.025
Urobilinogen, UA: 0.2
pH, UA: 5

## 2012-04-09 LAB — POCT UA - MICROSCOPIC ONLY
Crystals, Ur, HPF, POC: NEGATIVE
Mucus, UA: POSITIVE
Yeast, UA: NEGATIVE
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
## 2012-04-27 ENCOUNTER — Other Ambulatory Visit: Payer: Self-pay

## 2012-04-27 MED ORDER — ZOLPIDEM TARTRATE 10 MG PO TABS: 10.0000 mg | ORAL_TABLET | Freq: Every evening | ORAL | Status: AC | PRN

## 2012-04-28 DIAGNOSIS — I6789 Other cerebrovascular disease: Secondary | ICD-10-CM | POA: Diagnosis not present

## 2012-04-28 DIAGNOSIS — F4489 Other dissociative and conversion disorders: Secondary | ICD-10-CM | POA: Diagnosis not present

## 2012-04-29 ENCOUNTER — Encounter (HOSPITAL_COMMUNITY): Payer: Self-pay

## 2012-04-29 ENCOUNTER — Emergency Department (HOSPITAL_COMMUNITY)
Admission: EM | Admit: 2012-04-29 | Discharge: 2012-05-02 | Disposition: A | Discharge status: Home / Self Care | Payer: Medicare Other | Attending: Emergency Medicine | Admitting: Emergency Medicine

## 2012-04-29 DIAGNOSIS — Z7982 Long term (current) use of aspirin: Secondary | ICD-10-CM | POA: Insufficient documentation

## 2012-04-29 DIAGNOSIS — I2510 Atherosclerotic heart disease of native coronary artery without angina pectoris: Secondary | ICD-10-CM | POA: Insufficient documentation

## 2012-04-29 DIAGNOSIS — R4182 Altered mental status, unspecified: Secondary | ICD-10-CM | POA: Insufficient documentation

## 2012-04-29 DIAGNOSIS — E785 Hyperlipidemia, unspecified: Secondary | ICD-10-CM | POA: Insufficient documentation

## 2012-04-29 DIAGNOSIS — G4730 Sleep apnea, unspecified: Secondary | ICD-10-CM | POA: Insufficient documentation

## 2012-04-29 DIAGNOSIS — E119 Type 2 diabetes mellitus without complications: Secondary | ICD-10-CM | POA: Insufficient documentation

## 2012-04-29 DIAGNOSIS — Z79899 Other long term (current) drug therapy: Secondary | ICD-10-CM | POA: Insufficient documentation

## 2012-04-29 DIAGNOSIS — F1910 Other psychoactive substance abuse, uncomplicated: Secondary | ICD-10-CM | POA: Insufficient documentation

## 2012-04-29 DIAGNOSIS — K2100 Gastro-esophageal reflux disease with esophagitis, without bleeding: Secondary | ICD-10-CM | POA: Insufficient documentation

## 2012-04-29 DIAGNOSIS — I10 Essential (primary) hypertension: Secondary | ICD-10-CM | POA: Insufficient documentation

## 2012-04-29 DIAGNOSIS — Z9089 Acquired absence of other organs: Secondary | ICD-10-CM | POA: Insufficient documentation

## 2012-04-29 LAB — COMPREHENSIVE METABOLIC PANEL
ALT: 20 U/L (ref 0–53)
Alkaline Phosphatase: 63 U/L (ref 39–117)
BUN: 21 mg/dL (ref 6–23)
CO2: 26 mEq/L (ref 19–32)
Chloride: 108 mEq/L (ref 96–112)
GFR calc Af Amer: 76 mL/min — ABNORMAL LOW (ref >90)
Glucose, Bld: 129 mg/dL — ABNORMAL HIGH (ref 70–99)
Potassium: 3.6 mEq/L (ref 3.5–5.1)
Sodium: 144 mEq/L (ref 135–145)
Total Bilirubin: 0.2 mg/dL — ABNORMAL LOW (ref 0.3–1.2)
Total Protein: 6.7 g/dL (ref 6.0–8.3)

## 2012-04-29 LAB — CBC WITH DIFFERENTIAL/PLATELET
Hemoglobin: 11 g/dL — ABNORMAL LOW (ref 13.0–17.0)
Lymphocytes Relative: 24 % (ref 12–46)
Lymphs Abs: 1.7 10*3/uL (ref 0.7–4.0)
MCH: 31.6 pg (ref 26.0–34.0)
Monocytes Relative: 11 % (ref 3–12)
Neutro Abs: 4.7 10*3/uL (ref 1.7–7.7)
Neutrophils Relative %: 64 % (ref 43–77)
Platelets: 187 10*3/uL (ref 150–400)
RBC: 3.48 MIL/uL — ABNORMAL LOW (ref 4.22–5.81)
WBC: 7.2 10*3/uL (ref 4.0–10.5)

## 2012-04-29 LAB — PROTIME-INR
INR: 1.1 (ref 0.00–1.49)
Prothrombin Time: 14.4 seconds (ref 11.6–15.2)

## 2012-04-29 LAB — RAPID URINE DRUG SCREEN, HOSP PERFORMED
Barbiturates: NOT DETECTED
Benzodiazepines: NOT DETECTED

## 2012-04-29 LAB — ACETAMINOPHEN LEVEL: Acetaminophen (Tylenol), Serum: <15 ug/mL (ref 10–30)

## 2012-04-29 LAB — ETHANOL: Alcohol, Ethyl (B): <11 mg/dL (ref 0–11)

## 2012-04-29 LAB — SALICYLATE LEVEL: Salicylate Lvl: <2 mg/dL — ABNORMAL LOW (ref 2.8–20.0)

## 2012-04-29 NOTE — ED Notes (Signed)
Pt given urinal.

## 2012-04-29 NOTE — ED Notes (Signed)
Wife at bedside. States pt just got refill of zolpidem yesterday, 8 pills missing.

## 2012-04-29 NOTE — ED Notes (Signed)
MD at bedside. 

## 2012-04-29 NOTE — ED Notes (Signed)
Pt had 20 gauge IV Saline locked in left AC.  Pt has no memory if it was done by EMS or nurses.  RN removed IV. Site clean dry intact and cath complete

## 2012-04-29 NOTE — ED Notes (Signed)
Per EMS, pt wife came home and found patient with his pill bottles. Oriented to name and situation, slurred speech. Hx of alcohol abuse and overdose. Unsteady gait. Pt. Denies drinking alcohol or overdose of medications. LSN at 1430.

## 2012-04-29 NOTE — ED Notes (Signed)
Pt alert X4 but seems sleepy and slightly confused.   Pt denies taking extra medication and denies alcohol use today.  Pt very pleasant.  Denies SI/HI.  Pt resting in bed, personal items are with wife.
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
## 2012-04-29 NOTE — ED Provider Notes (Signed)
History     CSN: [id number]  Arrival date & time 04/29/12  0008   First MD Initiated Contact with Patient 04/29/12 0119      Chief Complaint  Patient presents with  . Altered Mental Status    (Consider location/radiation/quality/duration/timing/severity/associated sxs/prior treatment) HPI Comments: Patient with history of CABG, chronic back pain.  He is on chronic pain medications.  Brought tonight by ems after wife found him to be slurring his speech, appearing intoxicated.  He has done this numerous times in the past where he has taken too much of his medications.  He has not been depressed and is not suicidal per the wife.  The patient adds little to the history as he appears intoxicated.    Patient is a 66 y.o. male presenting with altered mental status. The history is provided by the patient and the spouse.  Altered Mental Status This is a recurrent problem. Episode onset: 4 hours ago. The problem occurs constantly. The problem has not changed since onset.Pertinent negatives include no chest pain and no abdominal pain.    Past Medical History  Diagnosis Date  . Coronary artery disease   . Hyperlipidemia   . Hypertension   . History of seizure disorder   . History of atrial flutter   . Depression   . Back pain     persistent  . Hearing loss   . Diverticulitis   . H/O alcohol abuse   . Sleep apnea     does not use CPAP  . Diabetes mellitus     controled by diet  . GERD (gastroesophageal reflux disease)   . Seizures   . Headache   . Arthritis     of spine    Past Surgical History  Procedure Date  . Partial colectomy     for diverticuli  . Orchiectomy   . Umbilical hernia repair   . Cervical fusion   . Coronary artery bypass graft 2005    LIMA graft to LAD,saphenous vein graft to diag.,circumflex, marginal,and to the RCA  . Cardiac catheterization   . Appendectomy   . Esophagogastroduodenoscopy 02/10/2012    Procedure: ESOPHAGOGASTRODUODENOSCOPY (EGD);   Surgeon: Cleotis Nipper, MD;  Location: [facility] ENDOSCOPY;  Service: Endoscopy;  Laterality: N/A;  . Foreign body removal 02/10/2012    Procedure: FOREIGN BODY REMOVAL;  Surgeon: Cleotis Nipper, MD;  Location: [place];  Service: Endoscopy;  Laterality: N/A;  . Back surgery     No family history on file.  History  Substance Use Topics  . Smoking status: Never Smoker   . Smokeless tobacco: Never Used  . Alcohol Use: Yes     rare beer      Review of Systems  Cardiovascular: Negative for chest pain.  Gastrointestinal: Negative for abdominal pain.  Psychiatric/Behavioral: Positive for altered mental status.  All other systems reviewed and are negative.    Allergies  Cephalexin; Doxycycline; and Pentazocine lactate  Home Medications   Current Outpatient Rx  Name Route Sig Dispense Refill  . ASPIRIN 81 MG PO TABS Oral Take 81 mg by mouth daily.    . ATORVASTATIN CALCIUM 80 MG PO TABS Oral Take 80 mg by mouth at bedtime.    Marland Kitchen LEVETIRACETAM 750 MG PO TABS Oral Take 1 tablet (750 mg total) by mouth 2 (two) times daily. 60 tablet 2  . LOSARTAN POTASSIUM-HCTZ 100-25 MG PO TABS Oral Take 1 tablet by mouth daily. 30 tablet 5  . METOPROLOL TARTRATE 100 MG PO  TABS Oral Take 50 mg by mouth 2 (two) times daily.     Marland Kitchen METRONIDAZOLE 500 MG PO TABS Oral Take 500 mg by mouth daily.    Marland Kitchen NITROGLYCERIN 0.4 MG SL SUBL Sublingual Place 0.4 mg under the tongue every 5 (five) minutes as needed. For chest pain    . SIMVASTATIN 80 MG PO TABS Oral Take 80 mg by mouth at bedtime.    . SUCRALFATE 1 G PO TABS Oral Take 1 tablet (1 g total) by mouth 4 (four) times daily. Ac and hs 120 tablet 0    Needs to follow up with GI  . TRAZODONE HCL 100 MG PO TABS Oral Take 100 mg by mouth at bedtime.    Marland Kitchen ZOLPIDEM TARTRATE 10 MG PO TABS Oral Take 1 tablet (10 mg total) by mouth at bedtime as needed for sleep. 30 tablet 0  . LORAZEPAM 1 MG PO TABS Oral Take 1 mg by mouth every 6 (six) hours as needed. For anxiety     . OMEPRAZOLE 20 MG PO CPDR Oral Take 20 mg by mouth 2 (two) times daily.      BP 97/60  Pulse 80  Temp 98.6 F (37 C) (Oral)  Resp 26  SpO2 95%  Physical Exam  Nursing note and vitals reviewed. Constitutional: He is oriented to person, place, and time. He appears well-developed and well-nourished.       The patient is resting comfortably.  When aroused he answers questions, although his speech is slurred and garbled.  He appears intoxicated.  HENT:  Head: Normocephalic and atraumatic.  Mouth/Throat: Oropharynx is clear and moist.  Eyes: EOM are normal. Pupils are equal, round, and reactive to light.  Neck: Normal range of motion. Neck supple.  Cardiovascular: Normal rate and regular rhythm.   Pulmonary/Chest: Effort normal and breath sounds normal. No respiratory distress. He has no wheezes.  Abdominal: Soft. Bowel sounds are normal. He exhibits no distension. There is no tenderness.  Musculoskeletal: Normal range of motion. He exhibits no edema.  Neurological: He is alert and oriented to person, place, and time.       Appears intoxicated, however moves all four extremities and strength is equal and symmetrical in all extremities.  Skin: Skin is warm and dry.    ED Course  Procedures (including critical care time)   Labs Reviewed  CBC WITH DIFFERENTIAL  COMPREHENSIVE METABOLIC PANEL  ETHANOL  URINE RAPID DRUG SCREEN (HOSP PERFORMED)  ACETAMINOPHEN LEVEL  SALICYLATE LEVEL  APTT  PROTIME-INR   No results found.   No diagnosis found.    MDM  This patient was brought to the ER for abuse of his prescription medications.  His wife says that this has been an ongoing problem for the past 15 years and getting worse as of late.  She believes that he took multiple [place] and oxycodone and that he has done this multiple times in the past.  He arrived to the ED somnolent with slurred speech and appeared intoxicated.  His tox screen was positive for opiates but negative for  benzos.  He has been observed for many hours and is now much more alert and appropriate.  I had a long discussion with his wife and it appears as if she is about at the end of her patience with this situation.  She wants him to have with help with getting off of these, or at the least to take them as prescribed.  He does feel as though he  has a problem, however the wife in insistent something be done.  I have consulted the [facility] team who is yet to evaluate the patient.  He will be seen by them to decide if he is appropriate for formal treatment of prescription medication abuse.  At this point, the patient's care will be signed out to Dr. Monia Pouch at shift change and the patient will be moved to Pod C until he can be seen by [facility].         Veryl Speak, MD 04/29/12 [phone]

## 2012-04-29 NOTE — ED Notes (Signed)
Pt currently in bathroom. Taken by wheelchair

## 2012-04-29 NOTE — ED Notes (Signed)
Pt showered without problem. Denies pain or shob. Pt eating dinner tray at present time

## 2012-04-29 NOTE — ED Notes (Signed)
Pt resting and alert X4.  Pt requesting breakfast and tray has been ordered

## 2012-04-29 NOTE — ED Notes (Signed)
Avedis Bevis (Wife): [phone]

## 2012-04-29 NOTE — ED Notes (Signed)
Pt ambulating around department, pt is cooperative, states he is tired of his room

## 2012-04-29 NOTE — ED Notes (Signed)
Pt. Continues to have slurred speech but is alert and oriented x4. Able to follow commands, eyes open, minimal shaking.

## 2012-04-29 NOTE — ED Notes (Signed)
Poison control called: recommend getting a tylenol level.

## 2012-04-29 NOTE — ED Notes (Signed)
Pt up to bathroom without difficulty. Vital signs stable. No signs of distress noted at the time.

## 2012-04-29 NOTE — ED Notes (Signed)
Pt cooperative and alert to person and place at this time. amb to shower with male ED tech.

## 2012-04-29 NOTE — ED Notes (Signed)
Pt. Here by EMS for altered mental status. Wife came home and found pt with pill bottles. Pt has hx of alcohol abuse and overdose. Pt. Denies drinking alcohol today or taking extra medication. Pt. drowsy and lethargic but responds to speech, oriented x4, obeys commands. Pt. Does not open eyes unless asked to do so. Legs shaky, drift noted when asked to hold up in air. Pt. Has slurred speech.

## 2012-04-30 ENCOUNTER — Emergency Department (HOSPITAL_COMMUNITY): Payer: Medicare Other

## 2012-04-30 DIAGNOSIS — M545 Low back pain: Secondary | ICD-10-CM | POA: Diagnosis not present

## 2012-04-30 DIAGNOSIS — I10 Essential (primary) hypertension: Secondary | ICD-10-CM | POA: Diagnosis not present

## 2012-04-30 MED ORDER — ZOLPIDEM TARTRATE 5 MG PO TABS: 10.0000 mg | ORAL_TABLET | Freq: Every evening | ORAL | Status: DC | PRN

## 2012-04-30 MED ORDER — LEVETIRACETAM 750 MG PO TABS
750.0000 mg | ORAL_TABLET | Freq: Two times a day (BID) | ORAL | Status: DC
  Administered 2012-04-30 – 2012-05-01 (×4): 750 mg via ORAL
  Filled 2012-04-30 (×5): qty 1

## 2012-04-30 MED ORDER — METOPROLOL TARTRATE 25 MG PO TABS
50.0000 mg | ORAL_TABLET | Freq: Two times a day (BID) | ORAL | Status: DC
  Administered 2012-04-30 – 2012-05-01 (×4): 50 mg via ORAL
  Filled 2012-04-30: qty 1
  Filled 2012-04-30 (×3): qty 2
  Filled 2012-04-30: qty 1

## 2012-04-30 MED ORDER — LOSARTAN POTASSIUM 50 MG PO TABS
100.0000 mg | ORAL_TABLET | Freq: Every day | ORAL | Status: DC
  Administered 2012-04-30 – 2012-05-01 (×2): 100 mg via ORAL
  Filled 2012-04-30 (×4): qty 2

## 2012-04-30 MED ORDER — SUCRALFATE 1 G PO TABS
1.0000 g | ORAL_TABLET | Freq: Three times a day (TID) | ORAL | Status: DC
  Administered 2012-04-30 – 2012-05-01 (×7): 1 g via ORAL
  Filled 2012-04-30 (×10): qty 1

## 2012-04-30 MED ORDER — ASPIRIN 81 MG PO TABS: 81.0000 mg | ORAL_TABLET | Freq: Every day | ORAL | Status: DC

## 2012-04-30 MED ORDER — PANTOPRAZOLE SODIUM 40 MG PO TBEC
40.0000 mg | DELAYED_RELEASE_TABLET | Freq: Two times a day (BID) | ORAL | Status: DC
  Administered 2012-04-30 – 2012-05-02 (×5): 40 mg via ORAL
  Filled 2012-04-30 (×5): qty 1

## 2012-04-30 MED ORDER — TRAZODONE HCL 100 MG PO TABS
100.0000 mg | ORAL_TABLET | Freq: Every day | ORAL | Status: DC
  Administered 2012-04-30 – 2012-05-01 (×2): 100 mg via ORAL
  Filled 2012-04-30 (×3): qty 1

## 2012-04-30 MED ORDER — METRONIDAZOLE 500 MG PO TABS
500.0000 mg | ORAL_TABLET | Freq: Every day | ORAL | Status: DC
Start: 2012-04-30 — End: 2012-05-02
  Administered 2012-04-30 – 2012-05-01 (×2): 500 mg via ORAL
  Filled 2012-04-30 (×3): qty 1

## 2012-04-30 MED ORDER — ATORVASTATIN CALCIUM 40 MG PO TABS: 40.0000 mg | ORAL_TABLET | Freq: Every day | ORAL | Status: DC

## 2012-04-30 MED ORDER — ATORVASTATIN CALCIUM 80 MG PO TABS
80.0000 mg | ORAL_TABLET | Freq: Every day | ORAL | Status: DC
  Administered 2012-04-30 – 2012-05-01 (×2): 80 mg via ORAL
  Filled 2012-04-30 (×3): qty 1

## 2012-04-30 MED ORDER — LORAZEPAM 1 MG PO TABS: 1.0000 mg | ORAL_TABLET | Freq: Four times a day (QID) | ORAL | Status: DC | PRN

## 2012-04-30 MED ORDER — NITROGLYCERIN 0.4 MG SL SUBL
0.4000 mg | SUBLINGUAL_TABLET | SUBLINGUAL | Status: DC | PRN
Start: 2012-04-30 — End: 2012-05-02

## 2012-04-30 MED ORDER — ASPIRIN 81 MG PO CHEW
81.0000 mg | CHEWABLE_TABLET | Freq: Every day | ORAL | Status: DC
  Administered 2012-04-30 – 2012-05-01 (×2): 81 mg via ORAL
  Filled 2012-04-30 (×3): qty 1

## 2012-04-30 MED ORDER — IBUPROFEN 200 MG PO TABS
600.0000 mg | ORAL_TABLET | Freq: Four times a day (QID) | ORAL | Status: DC | PRN
  Administered 2012-04-30 – 2012-05-01 (×2): 600 mg via ORAL
  Filled 2012-04-30 (×2): qty 3

## 2012-04-30 MED ORDER — HYDROCHLOROTHIAZIDE 25 MG PO TABS
25.0000 mg | ORAL_TABLET | Freq: Every day | ORAL | Status: DC
  Administered 2012-04-30 – 2012-05-01 (×2): 25 mg via ORAL
  Filled 2012-04-30 (×3): qty 1

## 2012-04-30 MED ORDER — LOSARTAN POTASSIUM-HCTZ 100-25 MG PO TABS: 1.0000 | ORAL_TABLET | Freq: Every day | ORAL | Status: DC

## 2012-04-30 NOTE — BH Assessment (Signed)
Assessment Note   [name] is an 66 y.o. male that was reassessed this day.  Pt is awake, but drowsy this morning.  Pt stated he still wants to go to detox.  Pt admitted his narcotic abuse "is a problem."  Pt denies SI/HI or psychosis.  There is no change in pt's presenting issues or history at this time.  Pt declined by Dr. Gilford Rile at [facility] due to health issues.  Called [facility] and per Shirlee Limerick, they will have discharges there and told writer to send referrall @ [phone].  Called [facility] and beds available per [facility] @ [phone] once have discharges today.  Heather then called @ [phone] and stated pt did not meet inpatient criteria, as he scored a zero on the COWS.  Pt pending [place].  Completed reassessment, assessment notification and faxed to [facility] to log.  Updated ED staff.   Axis I: Substance Induced Mood Disorder Axis II: Deferred Axis III:  Past Medical History  Diagnosis Date  . Coronary artery disease   . Hyperlipidemia   . Hypertension   . History of seizure disorder   . History of atrial flutter   . Depression   . Back pain     persistent  . Hearing loss   . Diverticulitis   . H/O alcohol abuse   . Sleep apnea     does not use CPAP  . Diabetes mellitus     controled by diet  . GERD (gastroesophageal reflux disease)   . Seizures   . Headache   . Arthritis     of spine   Axis IV: problems with primary support group Axis V: 21-30 behavior considerably influenced by delusions or hallucinations OR serious impairment in judgment, communication OR inability to function in almost all areas  Past Medical History:  Past Medical History  Diagnosis Date  . Coronary artery disease   . Hyperlipidemia   . Hypertension   . History of seizure disorder   . History of atrial flutter   . Depression   . Back pain     persistent  . Hearing loss   . Diverticulitis   . H/O alcohol abuse   . Sleep apnea     does not use CPAP  . Diabetes mellitus     controled by diet  . GERD  (gastroesophageal reflux disease)   . Seizures   . Headache   . Arthritis     of spine    Past Surgical History  Procedure Date  . Partial colectomy     for diverticuli  . Orchiectomy   . Umbilical hernia repair   . Cervical fusion   . Coronary artery bypass graft 2005    LIMA graft to LAD,saphenous vein graft to diag.,circumflex, marginal,and to the RCA  . Cardiac catheterization   . Appendectomy   . Esophagogastroduodenoscopy 02/10/2012    Procedure: ESOPHAGOGASTRODUODENOSCOPY (EGD);  Surgeon: Cleotis Nipper, MD;  Location: [facility] ENDOSCOPY;  Service: Endoscopy;  Laterality: N/A;  . Foreign body removal 02/10/2012    Procedure: FOREIGN BODY REMOVAL;  Surgeon: Cleotis Nipper, MD;  Location: [place];  Service: Endoscopy;  Laterality: N/A;  . Back surgery     Family History: No family history on file.  Social History:  reports that he has never smoked. He has never used smokeless tobacco. He reports that he drinks alcohol. He reports that he does not use illicit drugs.  Additional Social History:  Alcohol / Drug Use Pain Medications: Yes: see  MAR Prescriptions: Yes; see MAR Over the Counter: Yes; see MAR History of alcohol / drug use?: Yes Longest period of sobriety (when/how long): unknown Negative Consequences of Use: Personal relationships Withdrawal Symptoms:  (pt denies currently) Substance #1 Name of Substance 1: Opioids 1 - Age of First Use: [age] 1 - Amount (size/oz): unknown-pt reports "maybe one or two more than prescribed" 1 - Frequency: QD 1 - Duration: years 1 - Last Use / Amount: 04/28/2012 Substance #2 Name of Substance 2: ETOH 2 - Age of First Use: 17 2 - Amount (size/oz): Sporadic use only per pt 2 - Frequency: weekly 2 - Duration: years 2 - Last Use / Amount: Saturday, September 1st per report  CIWA: CIWA-Ar BP: 124/75 mmHg Pulse Rate: 86  COWS: Clinical Opiate Withdrawal Scale (COWS) Resting Pulse Rate: Pulse Rate 80 or below Sweating: No  report of chills or flushing Restlessness: Able to sit still Pupil Size: Pupils pinned or normal size for room light Bone or Joint Aches: Not present Runny Nose or Tearing: Not present GI Upset: No GI symptoms Tremor: No tremor Yawning: No yawning Anxiety or Irritability: None Gooseflesh Skin: Skin is smooth COWS Total Score: 0   Allergies:  Allergies  Allergen Reactions  . Cephalexin     Unknown  . Doxycycline     Unknown  . Pentazocine Lactate     Unknown    Home Medications:  (Not in a hospital admission)  OB/GYN Status:  No LMP for male patient.  General Assessment Data Location of Assessment: [facility] ED Living Arrangements: Spouse/significant other Can pt return to current living arrangement?: Yes Admission Status: Voluntary Is patient capable of signing voluntary admission?: Yes Transfer from: [facility] Referral Source: Self/Family/Friend  Education Status Is patient currently in school?: No  Risk to self Suicidal Ideation: No Suicidal Intent: No Is patient at risk for suicide?: No Suicidal Plan?: No Access to Means: No What has been your use of drugs/alcohol within the last 12 months?: Daily opioid use, weekly alcohol use by report Previous Attempts/Gestures: No How many times?: 0  Other Self Harm Risks: Reckless, endangers others while under the influence Triggers for Past Attempts: Family contact;Anniversary;Unpredictable Intentional Self Injurious Behavior: Damaging Comment - Self Injurious Behavior: Opioid abuse, Alcohol abuse Family Suicide History: No Recent stressful life event(s): Conflict (Comment);Job Loss;Recent negative physical changes;Turmoil (Comment);Trauma (Comment) Persecutory voices/beliefs?: No Depression: Yes Depression Symptoms: Despondent;Loss of interest in usual pleasures;Feeling worthless/self pity;Feeling angry/irritable Substance abuse history and/or treatment for substance abuse?: No Suicide prevention information given to  non-admitted patients: Not applicable  Risk to Others Homicidal Ideation: No Thoughts of Harm to Others: No Current Homicidal Intent: No Current Homicidal Plan: No Access to Homicidal Means: No Identified Victim: pt denies History of harm to others?: No Assessment of Violence: None Noted Violent Behavior Description: none noted Does patient have access to weapons?: No Criminal Charges Pending?: No Does patient have a court date: No  Psychosis Hallucinations: None noted Delusions: None noted  Mental Status Report Appear/Hygiene: Disheveled Eye Contact: Fair Motor Activity: Unremarkable Speech: Soft;Logical/coherent Level of Consciousness: Quiet/awake;Drowsy Mood: Ambivalent Affect: Appropriate to circumstance Anxiety Level: Minimal Thought Processes: Coherent;Relevant Judgement: Impaired Orientation: Person;Place;Time;Situation Obsessive Compulsive Thoughts/Behaviors: None  Cognitive Functioning Concentration: Decreased Memory: Recent Impaired;Remote Impaired IQ: Average Insight: Poor Impulse Control: Poor Appetite: Good Weight Loss: 0  Weight Gain: 0  Sleep: Decreased Total Hours of Sleep:  (4 hrs or less per night) Vegetative Symptoms: None  ADLScreening [facility] Assessment Services) Patient's cognitive ability adequate  to safely complete daily activities?: Yes Patient able to express need for assistance with ADLs?: Yes Independently performs ADLs?: Yes (appropriate for developmental age)  Abuse/Neglect [facility]) Physical Abuse: Denies Verbal Abuse: Denies Sexual Abuse: Denies  Prior Inpatient Therapy Prior Inpatient Therapy: Yes Prior Therapy Dates: cannot recall Prior Therapy Facilty/Provider(s): [place] Reason for Treatment: SA  Prior Outpatient Therapy Prior Outpatient Therapy: Yes Prior Therapy Dates: 2000 Prior Therapy Facilty/Provider(s): cannot recall Reason for Treatment: depression and adjustment related concerns  ADL Screening (condition at time  of admission) Patient's cognitive ability adequate to safely complete daily activities?: Yes Patient able to express need for assistance with ADLs?: Yes Independently performs ADLs?: Yes (appropriate for developmental age) Weakness of Legs: None Weakness of Arms/Hands: None  Home Assistive Devices/Equipment Home Assistive Devices/Equipment: None    Abuse/Neglect Assessment (Assessment to be complete while patient is alone) Physical Abuse: Denies Verbal Abuse: Denies Sexual Abuse: Denies Exploitation of patient/patient's resources: Denies Self-Neglect: Denies Values / Beliefs Cultural Requests During Hospitalization: None Spiritual Requests During Hospitalization: None Consults Spiritual Care Consult Needed: No Social Work Consult Needed: No Regulatory affairs officer (For Healthcare) Advance Directive: Patient does not have advance directive;Patient would not like information    Additional Information 1:1 In Past 12 Months?: No CIRT Risk: No Elopement Risk: No Does patient have medical clearance?: Yes     Disposition:  Disposition Disposition of Patient: Referred to;Inpatient treatment program Type of inpatient treatment program: Adult Patient referred to: Other (Comment) (Pending [facility] and [place])  On Site Evaluation by:   Reviewed with Physician:  Henrene Dodge 04/30/2012 9:39 AM

## 2012-04-30 NOTE — ED Notes (Signed)
Call received from patient's spouse inquiring about plan of care for patient as well as inquiring as to whether their daughter which resides in [place] could telephone and discuss her father's plan of care. Explained to spouse at length that in order to protect patient's privacy, information can not be disclosed over the phone. Plans are for spouse to come to facility tomorrow to speak with provider regarding plan of care. Spouses contact number verified while on telephone.

## 2012-05-01 NOTE — ED Notes (Signed)
Pt ambulated to restroom. 

## 2012-05-01 NOTE — ED Notes (Signed)
Pt resting. Pt picked out lunch. Calm, cooperative. No needs at this time. Provided diet coke.

## 2012-05-01 NOTE — ED Notes (Signed)
Pt given supplies for shower, pt given fresh scrubs, linens changed.

## 2012-05-02 MED ORDER — ONDANSETRON 8 MG PO TBDP: 8.0000 mg | ORAL_TABLET | Freq: Three times a day (TID) | ORAL | Status: AC | PRN

## 2012-05-02 MED ORDER — ONDANSETRON 8 MG PO TBDP: 8.0000 mg | ORAL_TABLET | Freq: Three times a day (TID) | ORAL | Status: DC | PRN

## 2012-05-02 MED ORDER — CLONIDINE HCL 0.1 MG PO TABS: 0.1000 mg | ORAL_TABLET | Freq: Three times a day (TID) | ORAL | Status: DC | PRN

## 2012-05-02 NOTE — ED Provider Notes (Signed)
8:09 AM Taking too much of his tylenol #3 and ambien. Has been in ER x 80 hrs. No with drawl side effects. Dc home. pcp follow up. No HI or SI  Hoy Morn, MD 05/02/12 [phone]

## 2012-05-06 ENCOUNTER — Ambulatory Visit: Payer: Medicare Other | Admitting: Cardiology

## 2012-05-12 DIAGNOSIS — R1310 Dysphagia, unspecified: Secondary | ICD-10-CM | POA: Insufficient documentation

## 2012-05-13 ENCOUNTER — Other Ambulatory Visit: Payer: Self-pay | Admitting: Gastroenterology

## 2012-05-13 DIAGNOSIS — R1310 Dysphagia, unspecified: Secondary | ICD-10-CM

## 2012-05-15 DIAGNOSIS — G40209 Localization-related (focal) (partial) symptomatic epilepsy and epileptic syndromes with complex partial seizures, not intractable, without status epilepticus: Secondary | ICD-10-CM | POA: Diagnosis not present

## 2012-05-15 DIAGNOSIS — R404 Transient alteration of awareness: Secondary | ICD-10-CM | POA: Diagnosis not present

## 2012-05-20 ENCOUNTER — Ambulatory Visit
Admission: RE | Admit: 2012-05-20 | Discharge: 2012-05-20 | Disposition: A | Discharge status: Home / Self Care | Payer: Medicare Other | Source: Ambulatory Visit | Attending: Gastroenterology | Admitting: Gastroenterology

## 2012-05-20 DIAGNOSIS — G8929 Other chronic pain: Secondary | ICD-10-CM | POA: Diagnosis not present

## 2012-05-20 DIAGNOSIS — M549 Dorsalgia, unspecified: Secondary | ICD-10-CM | POA: Diagnosis not present

## 2012-05-20 DIAGNOSIS — M5137 Other intervertebral disc degeneration, lumbosacral region: Secondary | ICD-10-CM | POA: Diagnosis not present

## 2012-05-20 DIAGNOSIS — K449 Diaphragmatic hernia without obstruction or gangrene: Secondary | ICD-10-CM | POA: Diagnosis not present

## 2012-05-20 DIAGNOSIS — M545 Low back pain, unspecified: Secondary | ICD-10-CM | POA: Diagnosis not present

## 2012-05-20 DIAGNOSIS — R1310 Dysphagia, unspecified: Secondary | ICD-10-CM

## 2012-05-20 DIAGNOSIS — K228 Other specified diseases of esophagus: Secondary | ICD-10-CM | POA: Diagnosis not present

## 2012-05-22 ENCOUNTER — Other Ambulatory Visit: Payer: Self-pay | Admitting: Emergency Medicine

## 2012-05-24 ENCOUNTER — Other Ambulatory Visit: Payer: Self-pay | Admitting: Emergency Medicine

## 2012-06-10 DIAGNOSIS — G4700 Insomnia, unspecified: Secondary | ICD-10-CM | POA: Diagnosis not present

## 2012-06-10 DIAGNOSIS — R404 Transient alteration of awareness: Secondary | ICD-10-CM | POA: Diagnosis not present

## 2012-06-10 DIAGNOSIS — G4730 Sleep apnea, unspecified: Secondary | ICD-10-CM | POA: Diagnosis not present

## 2012-06-11 ENCOUNTER — Other Ambulatory Visit: Payer: Self-pay

## 2012-06-11 MED ORDER — LOSARTAN POTASSIUM-HCTZ 100-25 MG PO TABS: 1.0000 | ORAL_TABLET | Freq: Every day | ORAL | Status: DC

## 2012-06-12 ENCOUNTER — Telehealth: Payer: Self-pay

## 2012-06-12 MED ORDER — ATORVASTATIN CALCIUM 80 MG PO TABS: 80.0000 mg | ORAL_TABLET | Freq: Every day | ORAL | Status: DC

## 2012-06-12 NOTE — Telephone Encounter (Signed)
Pt is requesting script to [facility] (changing from [place]) atorvastatin (LIPITOR) 80 MG tablet   [facility] [address]   [phone]

## 2012-06-12 NOTE — Telephone Encounter (Signed)
Rx sent 

## 2012-06-13 NOTE — Telephone Encounter (Signed)
lmom that rx was sent into pharmacy

## 2012-06-16 DIAGNOSIS — Z01810 Encounter for preprocedural cardiovascular examination: Secondary | ICD-10-CM | POA: Diagnosis not present

## 2012-06-16 DIAGNOSIS — R7989 Other specified abnormal findings of blood chemistry: Secondary | ICD-10-CM | POA: Diagnosis not present

## 2012-06-17 ENCOUNTER — Ambulatory Visit: Payer: Medicare Other | Admitting: Cardiology

## 2012-06-22 DIAGNOSIS — M5126 Other intervertebral disc displacement, lumbar region: Secondary | ICD-10-CM | POA: Diagnosis not present

## 2012-06-22 DIAGNOSIS — E291 Testicular hypofunction: Secondary | ICD-10-CM | POA: Diagnosis present

## 2012-06-22 DIAGNOSIS — K219 Gastro-esophageal reflux disease without esophagitis: Secondary | ICD-10-CM | POA: Diagnosis present

## 2012-06-22 DIAGNOSIS — Z961 Presence of intraocular lens: Secondary | ICD-10-CM | POA: Diagnosis not present

## 2012-06-22 DIAGNOSIS — Z9889 Other specified postprocedural states: Secondary | ICD-10-CM | POA: Diagnosis not present

## 2012-06-22 DIAGNOSIS — M5137 Other intervertebral disc degeneration, lumbosacral region: Secondary | ICD-10-CM | POA: Diagnosis not present

## 2012-06-22 DIAGNOSIS — Z9849 Cataract extraction status, unspecified eye: Secondary | ICD-10-CM | POA: Diagnosis not present

## 2012-06-22 DIAGNOSIS — N529 Male erectile dysfunction, unspecified: Secondary | ICD-10-CM | POA: Diagnosis present

## 2012-06-22 DIAGNOSIS — M549 Dorsalgia, unspecified: Secondary | ICD-10-CM | POA: Diagnosis not present

## 2012-06-22 DIAGNOSIS — M129 Arthropathy, unspecified: Secondary | ICD-10-CM | POA: Diagnosis not present

## 2012-06-22 DIAGNOSIS — Z91011 Allergy to milk products: Secondary | ICD-10-CM | POA: Diagnosis not present

## 2012-06-22 DIAGNOSIS — M51379 Other intervertebral disc degeneration, lumbosacral region without mention of lumbar back pain or lower extremity pain: Secondary | ICD-10-CM | POA: Diagnosis present

## 2012-06-22 DIAGNOSIS — I2510 Atherosclerotic heart disease of native coronary artery without angina pectoris: Secondary | ICD-10-CM | POA: Diagnosis present

## 2012-06-22 DIAGNOSIS — Z951 Presence of aortocoronary bypass graft: Secondary | ICD-10-CM | POA: Diagnosis not present

## 2012-06-22 DIAGNOSIS — F411 Generalized anxiety disorder: Secondary | ICD-10-CM | POA: Diagnosis present

## 2012-06-22 DIAGNOSIS — Z888 Allergy status to other drugs, medicaments and biological substances status: Secondary | ICD-10-CM | POA: Diagnosis not present

## 2012-06-22 DIAGNOSIS — E119 Type 2 diabetes mellitus without complications: Secondary | ICD-10-CM | POA: Diagnosis not present

## 2012-06-22 DIAGNOSIS — F329 Major depressive disorder, single episode, unspecified: Secondary | ICD-10-CM | POA: Diagnosis present

## 2012-06-22 DIAGNOSIS — Z981 Arthrodesis status: Secondary | ICD-10-CM | POA: Diagnosis not present

## 2012-06-22 DIAGNOSIS — E785 Hyperlipidemia, unspecified: Secondary | ICD-10-CM | POA: Diagnosis present

## 2012-06-22 DIAGNOSIS — Z881 Allergy status to other antibiotic agents status: Secondary | ICD-10-CM | POA: Diagnosis not present

## 2012-06-22 DIAGNOSIS — G4733 Obstructive sleep apnea (adult) (pediatric): Secondary | ICD-10-CM | POA: Diagnosis present

## 2012-06-22 DIAGNOSIS — I10 Essential (primary) hypertension: Secondary | ICD-10-CM | POA: Diagnosis present

## 2012-06-27 DIAGNOSIS — R0602 Shortness of breath: Secondary | ICD-10-CM | POA: Diagnosis not present

## 2012-06-27 DIAGNOSIS — R066 Hiccough: Secondary | ICD-10-CM | POA: Diagnosis not present

## 2012-06-27 DIAGNOSIS — E119 Type 2 diabetes mellitus without complications: Secondary | ICD-10-CM | POA: Diagnosis not present

## 2012-06-27 DIAGNOSIS — I2510 Atherosclerotic heart disease of native coronary artery without angina pectoris: Secondary | ICD-10-CM | POA: Diagnosis not present

## 2012-06-27 DIAGNOSIS — M545 Low back pain: Secondary | ICD-10-CM | POA: Diagnosis not present

## 2012-06-27 DIAGNOSIS — Z951 Presence of aortocoronary bypass graft: Secondary | ICD-10-CM | POA: Diagnosis not present

## 2012-06-27 DIAGNOSIS — I10 Essential (primary) hypertension: Secondary | ICD-10-CM | POA: Diagnosis not present

## 2012-06-29 ENCOUNTER — Ambulatory Visit (HOSPITAL_COMMUNITY)
Admission: RE | Admit: 2012-06-29 | Discharge: 2012-06-29 | Disposition: A | Discharge status: Other Acute Inpatient Hospital | Payer: Medicare Other | Source: Ambulatory Visit | Attending: Gastroenterology | Admitting: Gastroenterology

## 2012-06-29 ENCOUNTER — Encounter (HOSPITAL_COMMUNITY): Payer: Self-pay | Admitting: *Deleted

## 2012-06-29 ENCOUNTER — Encounter (HOSPITAL_COMMUNITY)
Admission: RE | Disposition: A | Discharge status: Other Acute Inpatient Hospital | Payer: Self-pay | Source: Ambulatory Visit | Attending: Gastroenterology

## 2012-06-29 ENCOUNTER — Ambulatory Visit (HOSPITAL_COMMUNITY): Payer: Medicare Other

## 2012-06-29 DIAGNOSIS — R1310 Dysphagia, unspecified: Secondary | ICD-10-CM | POA: Diagnosis not present

## 2012-06-29 DIAGNOSIS — K2970 Gastritis, unspecified, without bleeding: Secondary | ICD-10-CM | POA: Insufficient documentation

## 2012-06-29 DIAGNOSIS — K2990 Gastroduodenitis, unspecified, without bleeding: Secondary | ICD-10-CM | POA: Insufficient documentation

## 2012-06-29 HISTORY — PX: ESOPHAGOGASTRODUODENOSCOPY: SHX5428

## 2012-06-29 HISTORY — PX: FOREIGN BODY REMOVAL: SHX962

## 2012-06-29 HISTORY — PX: SAVORY DILATION: SHX5439

## 2012-06-29 SURGERY — EGD (ESOPHAGOGASTRODUODENOSCOPY)
Anesthesia: Moderate Sedation

## 2012-06-29 MED ORDER — BUTAMBEN-TETRACAINE-BENZOCAINE 2-2-14 % EX AERO
INHALATION_SPRAY | CUTANEOUS | Status: DC | PRN
  Administered 2012-06-29: 2 via TOPICAL

## 2012-06-29 MED ORDER — FENTANYL CITRATE 0.05 MG/ML IJ SOLN
INTRAMUSCULAR | Status: DC | PRN
  Administered 2012-06-29 (×4): 25 ug via INTRAVENOUS

## 2012-06-29 MED ORDER — MIDAZOLAM HCL 10 MG/2ML IJ SOLN
INTRAMUSCULAR | Status: DC | PRN
  Administered 2012-06-29 (×5): 2 mg via INTRAVENOUS

## 2012-06-29 MED ORDER — SODIUM CHLORIDE 0.9 % IV SOLN: INTRAVENOUS | Status: DC

## 2012-06-29 MED ORDER — FENTANYL CITRATE 0.05 MG/ML IJ SOLN
INTRAMUSCULAR | Status: AC
  Filled 2012-06-29: qty 2

## 2012-06-29 MED ORDER — MIDAZOLAM HCL 10 MG/2ML IJ SOLN
INTRAMUSCULAR | Status: AC
  Filled 2012-06-29: qty 2

## 2012-06-29 NOTE — H&P (Signed)
Subjective:   Patient is a 66 y.o. male presents with dysphagia x 2 days after back surgery 1 week ago at [facility] was able to eat ok for 3 days after got home. History of previous impaction and dilation Procedure including risks and benefits discussed in office.  Patient Active Problem List   Diagnosis Date Noted  . Seizure disorder 04/04/2012  . CAD (coronary artery disease) 06/13/2011  . HTN (hypertension) 06/13/2011  . Diabetes mellitus 06/13/2011  . History of seizure disorder   . History of atrial flutter   . Depression   . Back pain   . Hearing loss   . Arrhythmia   . Diverticulitis   . H/O alcohol abuse    Past Medical History  Diagnosis Date  . Coronary artery disease   . Hyperlipidemia   . Hypertension   . History of seizure disorder   . History of atrial flutter   . Depression   . Back pain     persistent  . Hearing loss   . Diverticulitis   . H/O alcohol abuse   . Sleep apnea     does not use CPAP  . Diabetes mellitus     controled by diet  . GERD (gastroesophageal reflux disease)   . Seizures   . Headache   . Arthritis     of spine    Past Surgical History  Procedure Date  . Partial colectomy     for diverticuli  . Orchiectomy   . Umbilical hernia repair   . Cervical fusion   . Coronary artery bypass graft 2005    LIMA graft to LAD,saphenous vein graft to diag.,circumflex, marginal,and to the RCA  . Cardiac catheterization   . Appendectomy   . Esophagogastroduodenoscopy 02/10/2012    Procedure: ESOPHAGOGASTRODUODENOSCOPY (EGD);  Surgeon: Cleotis Nipper, MD;  Location: [facility] ENDOSCOPY;  Service: Endoscopy;  Laterality: N/A;  . Foreign body removal 02/10/2012    Procedure: FOREIGN BODY REMOVAL;  Surgeon: Cleotis Nipper, MD;  Location: [place];  Service: Endoscopy;  Laterality: N/A;  . Back surgery     Prescriptions prior to admission  Medication Sig Dispense Refill  . aspirin 81 MG tablet Take 81 mg by mouth daily.      Marland Kitchen atorvastatin  (LIPITOR) 80 MG tablet Take 1 tablet (80 mg total) by mouth at bedtime.  30 tablet  3  . cloNIDine (CATAPRES) 0.1 MG tablet Take 1 tablet (0.1 mg total) by mouth every 8 (eight) hours as needed.  9 tablet  0  . levETIRAcetam (KEPPRA) 750 MG tablet Take 1 tablet (750 mg total) by mouth 2 (two) times daily.  60 tablet  2  . LORazepam (ATIVAN) 1 MG tablet Take 1 mg by mouth every 6 (six) hours as needed. For anxiety      . losartan-hydrochlorothiazide (HYZAAR) 100-25 MG per tablet Take 1 tablet by mouth daily.  30 tablet  1  . methocarbamol (ROBAXIN) 500 MG tablet Take 500 mg by mouth 4 (four) times daily. As needed      . metoprolol (LOPRESSOR) 50 MG tablet       . metroNIDAZOLE (FLAGYL) 500 MG tablet Take 500 mg by mouth daily.      Marland Kitchen omeprazole (PRILOSEC) 20 MG capsule Take 20 mg by mouth 2 (two) times daily.      . polyethylene glycol (MIRALAX / GLYCOLAX) packet Take 17 g by mouth daily.      . simvastatin (ZOCOR) 80 MG tablet Take 80  mg by mouth at bedtime.      Marland Kitchen testosterone cypionate (DEPOTESTOTERONE CYPIONATE) 200 MG/ML injection Inject into the muscle every 14 (fourteen) days.      . traZODone (DESYREL) 100 MG tablet TAKE 1 TABLET BY MOUTH AT BEDTIME  30 tablet  0  . zolpidem (AMBIEN) 10 MG tablet Take 10 mg by mouth at bedtime as needed.      . [DISCONTINUED] metoprolol (LOPRESSOR) 50 MG tablet TAKE 1 TABLET BY MOUTH TWICE DAILY  60 tablet  2  . metoprolol (LOPRESSOR) 100 MG tablet Take 50 mg by mouth 2 (two) times daily.       . nitroGLYCERIN (NITROSTAT) 0.4 MG SL tablet Place 0.4 mg under the tongue every 5 (five) minutes as needed. For chest pain      . sucralfate (CARAFATE) 1 G tablet Take 1 tablet (1 g total) by mouth 4 (four) times daily. Ac and hs  120 tablet  0   Allergies  Allergen Reactions  . Cephalexin     Unknown  . Doxycycline     Unknown  . Pentazocine Lactate     Unknown    History  Substance Use Topics  . Smoking status: Never Smoker   . Smokeless tobacco:  Never Used  . Alcohol Use: Yes     Comment: rare beer    History reviewed. No pertinent family history.   Objective:   Patient Vitals for the past 8 hrs:  BP Temp Temp src Resp SpO2 Height Weight  06/29/12 1334 134/77 mmHg 97.9 F (36.6 C) Oral 19  98 % 5' 5"  (1.651 m) 92.987 kg (205 lb)         See MD Preop evaluation      Assessment:   1. ? Food impaction, history of previous stricture  Plan:   1. EGD removal of impaction if needed and savory dilation if needed.

## 2012-06-29 NOTE — Op Note (Signed)
[facility] [place], [id number]   ENDOSCOPY PROCEDURE REPORT  PATIENT: Jacob, Sharp  MR#: [id number] BIRTHDATE: 30-Aug-1945 , 66  yrs. old GENDER: Male ENDOSCOPIST:Lequan [name], MD REFERRED BY:  [name] PROCEDURE DATE:  06/29/2012 PROCEDURE:      EGD with biopsy and savory dilatation ASA CLASS:  class 3 INDICATIONS: Dysphagia with history of food impaction and priot dilatation MEDICATION:    fentanyl 100 mcg, versed 10 mg TOPICAL ANESTHETIC:    cetacaine spray  DESCRIPTION OF PROCEDURE: Pentax adult scope inserted blindly with deglutination.  Esophagus normal with some spasm, no food impaction or liquid.  GEJ appeared normal  marked antral gasttritis, biopsied for H pylori. Bulb and 2nd duodenum normal.  Savory GW placed and scope withdrawn using fluro. Passed 15 and 16 savory dilators passed using fluro. No blood on the dilator. Dilator and GW withdrawn.     COMPLICATIONS: None  ENDOSCOPIC IMPRESSION: 1.  Dysphagia w/o obvious stricure. Dilated to 16 mm  RECOMMENDATIONS: Routine post dilation orders, will likely need manometry. Ov 2-3 weeks    _______________________________ Lorrin MaisLaurence Spates, MD 06/29/2012 3:29 PM

## 2012-06-29 NOTE — OR Nursing (Signed)
IV line removed from RT hand 06/29/12 at 1615. Catheter intact upon removal.  Site is clean, dry and intact.  No signs of redness or swelling. No charting that IV was started in Epic. Ethelene Hal, RN

## 2012-06-29 NOTE — Discharge Instructions (Addendum)
Continue current meds. Clear liquids for 4-6 hours, if no chest pain or trouble breathing, soft foods tonight.  Regular diet tomorrow. Call for problems.   Gastrointestinal Endoscopy Care After Refer to this sheet in the next few weeks. These instructions provide you with information on caring for yourself after your procedure. Your caregiver may also give you more specific instructions. Your treatment has been planned according to current medical practices, but problems sometimes occur. Call your caregiver if you have any problems or questions after your procedure. HOME CARE INSTRUCTIONS  If you were given medicine to help you relax (sedative), do not drive, operate machinery, or sign important documents for 24 hours.  Avoid alcohol and hot or warm beverages for the first 24 hours after the procedure.  Only take over-the-counter or prescription medicines for pain, discomfort, or fever as directed by your caregiver. You may resume taking your normal medicines unless your caregiver tells you otherwise. Ask your caregiver when you may resume taking medicines that may cause bleeding, such as aspirin, clopidogrel, or warfarin.  You may return to your normal activities on the day after your procedure, or as directed by your caregiver. Walking may help to reduce any bloated feeling in your abdomen.  Drink enough fluids to keep your urine clear or pale yellow.  You may gargle with salt water if you have a sore throat. SEEK IMMEDIATE MEDICAL CARE IF:  You have severe nausea or vomiting.  You have severe abdominal pain, abdominal cramps that last longer than 6 hours, or abdominal swelling (distention).  You have severe shoulder or back pain.  You have trouble swallowing.  You have shortness of breath, your breathing is shallow, or you are breathing faster than normal.  You have a fever or a rapid heartbeat.  You vomit blood or material that looks like coffee grounds.  You have bloody,  black, or tarry stools. MAKE SURE YOU:  Understand these instructions.  Will watch your condition.  Will get help right away if you are not doing well or get worse. Document Released: 03/26/2004 Document Revised: 02/11/2012 Document Reviewed: 11/12/2011 [facility] Patient Information 2013 [place].

## 2012-06-30 ENCOUNTER — Encounter (HOSPITAL_COMMUNITY): Payer: Self-pay

## 2012-06-30 ENCOUNTER — Encounter (HOSPITAL_COMMUNITY): Payer: Self-pay | Admitting: Gastroenterology

## 2012-06-30 DIAGNOSIS — K319 Disease of stomach and duodenum, unspecified: Secondary | ICD-10-CM | POA: Diagnosis not present

## 2012-07-01 ENCOUNTER — Ambulatory Visit: Payer: Medicare Other | Admitting: Cardiology

## 2012-07-02 ENCOUNTER — Encounter: Payer: Self-pay | Admitting: Family Medicine

## 2012-07-02 ENCOUNTER — Other Ambulatory Visit: Payer: Self-pay | Admitting: Gastroenterology

## 2012-07-02 ENCOUNTER — Ambulatory Visit
Admission: RE | Admit: 2012-07-02 | Discharge: 2012-07-02 | Disposition: A | Discharge status: Home / Self Care | Payer: Medicare Other | Source: Ambulatory Visit | Attending: Gastroenterology | Admitting: Gastroenterology

## 2012-07-02 DIAGNOSIS — R1310 Dysphagia, unspecified: Secondary | ICD-10-CM

## 2012-07-02 DIAGNOSIS — R066 Hiccough: Secondary | ICD-10-CM

## 2012-07-08 ENCOUNTER — Ambulatory Visit (INDEPENDENT_AMBULATORY_CARE_PROVIDER_SITE_OTHER): Payer: Medicare Other | Admitting: Emergency Medicine

## 2012-07-08 VITALS — BP 100/62 | HR 74 | Temp 97.7°F | Resp 16 | Ht 65.0 in | Wt 197.0 lb

## 2012-07-08 DIAGNOSIS — M549 Dorsalgia, unspecified: Secondary | ICD-10-CM | POA: Diagnosis not present

## 2012-07-08 DIAGNOSIS — R569 Unspecified convulsions: Secondary | ICD-10-CM

## 2012-07-08 DIAGNOSIS — G4700 Insomnia, unspecified: Secondary | ICD-10-CM

## 2012-07-08 DIAGNOSIS — E119 Type 2 diabetes mellitus without complications: Secondary | ICD-10-CM

## 2012-07-08 NOTE — Progress Notes (Signed)
  Subjective:    Patient ID: [name], male    DOB: 10/16/1945, 66 y.o.   MRN: [id number]  HPI  Patient had back surgery with a physician in [place].  Patient is also going to pain management.  He was taken off of ambien and trazodone.  Spoke with pain management about staying on ambien, trazodone and lorazepam.  They do not agree with this.  [facility] recommended going back on lorazepam.  Been off of lorazepam for three weeks.  Pain management psychologist is in [place] patient is under the care of Dr. Ernesto Rutherford the the [facility] in [place] he recently had back surgery by Dr. branch neurosurgical department at [facility]. He is also seeing a [place] counselor by the name of Fransisco Beau who has an office on [employer]..     Review of Systems     Objective:   Physical Exam physical exam reveals an alert gentleman in was in no distress. His chest was clear his cardiac exam is unremarkable. His well-healed scar over the lower lumbar spine. Neurologically he is intact .        Assessment & Plan:  I cannot refill the right any medications during this visit I spoke with Dr. Jennette Dubin his doctor in the [place] system and she is writing for his medications he is currently being involved in substance abuse treatment and therapy I did not refill his Desyrel because he is also on Keppra and I was concerned about possible interaction potential procedures here. Has a very complicated history and is followed by Dr. Heloise Beecham neurologist and also by Dr. Pernell Dupre and Dr. Peter Martinique for cardiology issues. I did not feel comfortable writing for any medications at this visit. I feels pain medication should be managed through his surgeon at [facility] and to the substance abuse counselor. Her prescription should only be written by one provider which is going to be Dr. Dierdre Searles who is at the [place].

## 2012-07-15 DIAGNOSIS — L821 Other seborrheic keratosis: Secondary | ICD-10-CM | POA: Diagnosis not present

## 2012-07-15 DIAGNOSIS — D239 Other benign neoplasm of skin, unspecified: Secondary | ICD-10-CM | POA: Diagnosis not present

## 2012-07-15 DIAGNOSIS — L910 Hypertrophic scar: Secondary | ICD-10-CM | POA: Diagnosis not present

## 2012-07-15 DIAGNOSIS — D1801 Hemangioma of skin and subcutaneous tissue: Secondary | ICD-10-CM | POA: Diagnosis not present

## 2012-07-15 DIAGNOSIS — L819 Disorder of pigmentation, unspecified: Secondary | ICD-10-CM | POA: Diagnosis not present

## 2012-07-15 DIAGNOSIS — L570 Actinic keratosis: Secondary | ICD-10-CM | POA: Diagnosis not present

## 2012-07-15 DIAGNOSIS — L708 Other acne: Secondary | ICD-10-CM | POA: Diagnosis not present

## 2012-07-20 DIAGNOSIS — Z981 Arthrodesis status: Secondary | ICD-10-CM | POA: Diagnosis not present

## 2012-07-20 DIAGNOSIS — Z4789 Encounter for other orthopedic aftercare: Secondary | ICD-10-CM | POA: Diagnosis not present

## 2012-07-27 ENCOUNTER — Encounter (HOSPITAL_COMMUNITY): Payer: Self-pay | Admitting: Emergency Medicine

## 2012-07-27 ENCOUNTER — Emergency Department (HOSPITAL_COMMUNITY)
Admission: EM | Admit: 2012-07-27 | Discharge: 2012-07-27 | Disposition: A | Discharge status: Home / Self Care | Payer: Medicare Other | Attending: Emergency Medicine | Admitting: Emergency Medicine

## 2012-07-27 DIAGNOSIS — G40909 Epilepsy, unspecified, not intractable, without status epilepticus: Secondary | ICD-10-CM | POA: Insufficient documentation

## 2012-07-27 DIAGNOSIS — Z8719 Personal history of other diseases of the digestive system: Secondary | ICD-10-CM | POA: Diagnosis not present

## 2012-07-27 DIAGNOSIS — Z8679 Personal history of other diseases of the circulatory system: Secondary | ICD-10-CM | POA: Diagnosis not present

## 2012-07-27 DIAGNOSIS — Z7982 Long term (current) use of aspirin: Secondary | ICD-10-CM | POA: Diagnosis not present

## 2012-07-27 DIAGNOSIS — R066 Hiccough: Secondary | ICD-10-CM | POA: Diagnosis not present

## 2012-07-27 DIAGNOSIS — Z791 Long term (current) use of non-steroidal anti-inflammatories (NSAID): Secondary | ICD-10-CM | POA: Diagnosis not present

## 2012-07-27 DIAGNOSIS — Z951 Presence of aortocoronary bypass graft: Secondary | ICD-10-CM | POA: Diagnosis not present

## 2012-07-27 DIAGNOSIS — I10 Essential (primary) hypertension: Secondary | ICD-10-CM | POA: Insufficient documentation

## 2012-07-27 DIAGNOSIS — F329 Major depressive disorder, single episode, unspecified: Secondary | ICD-10-CM | POA: Insufficient documentation

## 2012-07-27 DIAGNOSIS — K219 Gastro-esophageal reflux disease without esophagitis: Secondary | ICD-10-CM | POA: Insufficient documentation

## 2012-07-27 DIAGNOSIS — G4730 Sleep apnea, unspecified: Secondary | ICD-10-CM | POA: Diagnosis not present

## 2012-07-27 DIAGNOSIS — I2510 Atherosclerotic heart disease of native coronary artery without angina pectoris: Secondary | ICD-10-CM | POA: Diagnosis not present

## 2012-07-27 DIAGNOSIS — Z8739 Personal history of other diseases of the musculoskeletal system and connective tissue: Secondary | ICD-10-CM | POA: Diagnosis not present

## 2012-07-27 DIAGNOSIS — E785 Hyperlipidemia, unspecified: Secondary | ICD-10-CM | POA: Insufficient documentation

## 2012-07-27 DIAGNOSIS — H9190 Unspecified hearing loss, unspecified ear: Secondary | ICD-10-CM | POA: Diagnosis not present

## 2012-07-27 DIAGNOSIS — E119 Type 2 diabetes mellitus without complications: Secondary | ICD-10-CM | POA: Diagnosis not present

## 2012-07-27 DIAGNOSIS — IMO0002 Reserved for concepts with insufficient information to code with codable children: Secondary | ICD-10-CM | POA: Diagnosis not present

## 2012-07-27 DIAGNOSIS — R1110 Vomiting, unspecified: Secondary | ICD-10-CM | POA: Insufficient documentation

## 2012-07-27 DIAGNOSIS — F3289 Other specified depressive episodes: Secondary | ICD-10-CM | POA: Insufficient documentation

## 2012-07-27 LAB — TROPONIN I: Troponin I: <0.3 ng/mL (ref <0.30)

## 2012-07-27 MED ORDER — SODIUM CHLORIDE 0.9 % IV SOLN
25.0000 mg | Freq: Once | INTRAVENOUS | Status: AC
  Administered 2012-07-27: 25 mg via INTRAVENOUS
  Filled 2012-07-27: qty 1

## 2012-07-27 MED ORDER — CHLORPROMAZINE HCL 10 MG PO TABS: 25.0000 mg | ORAL_TABLET | Freq: Four times a day (QID) | ORAL | Status: DC

## 2012-07-27 MED ORDER — ONDANSETRON 8 MG PO TBDP
8.0000 mg | ORAL_TABLET | Freq: Once | ORAL | Status: AC
  Administered 2012-07-27: 8 mg via ORAL
  Filled 2012-07-27: qty 1

## 2012-07-27 NOTE — ED Notes (Signed)
Gave patient a sandwich and some cheese with a ginger ale

## 2012-07-27 NOTE — ED Notes (Signed)
Pt presenting to ed with c/o hiccups x 2 days pt states they are worse when he's laying down. Pt states he was told to present to ed by his pcp. Pt denies chest pain, shortness of breath, no vomiting pt states some nausea.

## 2012-07-27 NOTE — ED Provider Notes (Addendum)
History     CSN: [id number]  Arrival date & time 07/27/12  0917   First MD Initiated Contact with Patient 07/27/12 0940      Chief Complaint  Patient presents with  . Hiccups    (Consider location/radiation/quality/duration/timing/severity/associated sxs/prior treatment) HPI Comments: 66 y/o with hx of CAD comes in with cc of hiccups. Hiccups started last night, and is worse with laying down. There is no no chest pain, sob, n/v/f/c. Pt does have some emesis after the hiccups. States that he was one several sedative medications, including ativan, Lorrin Mais that were discontinued recently when he accidentally overdosed.   The history is provided by the patient and medical records.    Past Medical History  Diagnosis Date  . Coronary artery disease   . Hyperlipidemia   . Hypertension   . History of seizure disorder   . History of atrial flutter   . Depression   . Back pain     persistent  . Hearing loss   . Diverticulitis   . H/O alcohol abuse   . Diabetes mellitus     controled by diet  . GERD (gastroesophageal reflux disease)   . Seizures   . Headache   . Arthritis     of spine  . Sleep apnea     does not use CPAP    Past Surgical History  Procedure Date  . Partial colectomy     for diverticuli  . Orchiectomy   . Umbilical hernia repair   . Cervical fusion   . Coronary artery bypass graft 2005    LIMA graft to LAD,saphenous vein graft to diag.,circumflex, marginal,and to the RCA  . Cardiac catheterization   . Appendectomy   . Esophagogastroduodenoscopy 02/10/2012    Procedure: ESOPHAGOGASTRODUODENOSCOPY (EGD);  Surgeon: Cleotis Nipper, MD;  Location: [facility] ENDOSCOPY;  Service: Endoscopy;  Laterality: N/A;  . Foreign body removal 02/10/2012    Procedure: FOREIGN BODY REMOVAL;  Surgeon: Cleotis Nipper, MD;  Location: [place];  Service: Endoscopy;  Laterality: N/A;  . Back surgery   . Esophagogastroduodenoscopy 06/29/2012    Procedure:  ESOPHAGOGASTRODUODENOSCOPY (EGD);  Surgeon: Winfield Cunas., MD;  Location: [facility] ENDOSCOPY;  Service: Endoscopy;  Laterality: N/A;  . Foreign body removal 06/29/2012    Procedure: FOREIGN BODY REMOVAL;  Surgeon: Winfield Cunas., MD;  Location: [facility] ENDOSCOPY;  Service: Endoscopy;  Laterality: N/A;  . Savory dilation 06/29/2012    Procedure: SAVORY DILATION;  Surgeon: Winfield Cunas., MD;  Location: [facility] ENDOSCOPY;  Service: Endoscopy;  Laterality: N/A;    No family history on file.  History  Substance Use Topics  . Smoking status: Never Smoker   . Smokeless tobacco: Never Used  . Alcohol Use: No     Comment: rare beer      Review of Systems  Constitutional: Negative for activity change and appetite change.  Respiratory: Negative for cough and shortness of breath.   Cardiovascular: Negative for chest pain.  Gastrointestinal: Negative for abdominal pain.  Genitourinary: Negative for dysuria.    Allergies  Cephalexin; Doxycycline; and Pentazocine lactate  Home Medications   Current Outpatient Rx  Name  Route  Sig  Dispense  Refill  . ASPIRIN 325 MG PO TABS   Oral   Take 325 mg by mouth daily.         . ATORVASTATIN CALCIUM 80 MG PO TABS   Oral   Take 1 tablet (80 mg total) by mouth at bedtime.  30 tablet   3   . AZELASTINE HCL 137 MCG/SPRAY NA SOLN   Nasal   Place 2 sprays into the nose 2 (two) times daily. Use in each nostril as directed         . ETODOLAC 500 MG PO TABS   Oral   Take 500 mg by mouth 2 (two) times daily.         . OMEGA-3 FATTY ACIDS 1000 MG PO CAPS   Oral   Take 1 g by mouth daily.         Marland Kitchen LEVETIRACETAM 750 MG PO TABS   Oral   Take 1 tablet (750 mg total) by mouth 2 (two) times daily.   60 tablet   2   . LORAZEPAM 1 MG PO TABS   Oral   Take 1 mg by mouth every 6 (six) hours as needed. For anxiety         . LOSARTAN POTASSIUM-HCTZ 100-25 MG PO TABS   Oral   Take 1 tablet by mouth daily.   30 tablet   1   .  METOPROLOL TARTRATE 50 MG PO TABS      50 mg 2 (two) times daily.          Marland Kitchen METRONIDAZOLE 500 MG PO TABS   Oral   Take 500 mg by mouth every 7 (seven) days. SATURDAY         . MULTI-VITAMIN/MINERALS PO TABS   Oral   Take 1 tablet by mouth daily.         Marland Kitchen NITROGLYCERIN 0.4 MG SL SUBL   Sublingual   Place 0.4 mg under the tongue every 5 (five) minutes as needed. For chest pain         . OMEPRAZOLE 20 MG PO CPDR   Oral   Take 20 mg by mouth 2 (two) times daily.         Marland Kitchen POLYETHYLENE GLYCOL 3350 PO PACK   Oral   Take 17 g by mouth daily.         . TESTOSTERONE CYPIONATE 200 MG/ML IM OIL   Intramuscular   Inject into the muscle every 14 (fourteen) days.         . TRAMADOL HCL 50 MG PO TABS   Oral   Take 50 mg by mouth every 8 (eight) hours as needed.         Marland Kitchen CLONIDINE HCL 0.1 MG PO TABS   Oral   Take 1 tablet (0.1 mg total) by mouth every 8 (eight) hours as needed.   9 tablet   0   . SUCRALFATE 1 G PO TABS   Oral   Take 1 tablet (1 g total) by mouth 4 (four) times daily. Ac and hs   120 tablet   0     Needs to follow up with GI   . ZOLPIDEM TARTRATE 10 MG PO TABS   Oral   Take 10 mg by mouth at bedtime as needed.           BP 133/66  Pulse 81  Temp 98.4 F (36.9 C) (Oral)  Resp 16  SpO2 97%  Physical Exam  Constitutional: He is oriented to person, place, and time. He appears well-developed.  HENT:  Head: Normocephalic and atraumatic.  Eyes: Conjunctivae normal and EOM are normal. Pupils are equal, round, and reactive to light.  Neck: Normal range of motion. Neck supple.  Cardiovascular: Normal rate and regular rhythm.  Pulmonary/Chest: Effort normal and breath sounds normal.  Abdominal: Soft. Bowel sounds are normal. He exhibits no distension. There is no tenderness. There is no rebound and no guarding.  Neurological: He is alert and oriented to person, place, and time.  Skin: Skin is warm.    ED Course  Procedures (including  critical care time)   Labs Reviewed  TROPONIN I   No results found.   No diagnosis found.    MDM  Pt comes in with hiccups, and they are positional. Will get EKG due to his CAD hx., but otherwise we went over some vagal maneuvers that might help him.    Varney Biles, MD 07/27/12 1257   Date: 07/27/2012  Rate: 78  Rhythm: normal sinus rhythm  QRS Axis: normal  Intervals: normal  ST/T Wave abnormalities: normal  Conduction Disutrbances: none  Narrative Interpretation: unremarkable  Still having hiccups. Will d/c with thorazine PCP f/u if sx persists.      Varney Biles, MD 07/27/12 1306

## 2012-07-29 ENCOUNTER — Ambulatory Visit: Payer: Medicare Other

## 2012-07-29 ENCOUNTER — Other Ambulatory Visit (HOSPITAL_COMMUNITY): Payer: Self-pay | Admitting: Family Medicine

## 2012-07-29 ENCOUNTER — Ambulatory Visit (INDEPENDENT_AMBULATORY_CARE_PROVIDER_SITE_OTHER): Payer: Medicare Other | Admitting: Emergency Medicine

## 2012-07-29 VITALS — BP 111/70 | HR 82 | Temp 98.3°F | Resp 16 | Ht 66.5 in | Wt 208.0 lb

## 2012-07-29 DIAGNOSIS — R066 Hiccough: Secondary | ICD-10-CM

## 2012-07-29 DIAGNOSIS — K449 Diaphragmatic hernia without obstruction or gangrene: Secondary | ICD-10-CM

## 2012-07-29 DIAGNOSIS — K922 Gastrointestinal hemorrhage, unspecified: Secondary | ICD-10-CM

## 2012-07-29 NOTE — Progress Notes (Signed)
  Subjective:    Patient ID: [name], male    DOB: May 30, 1946, 66 y.o.   MRN: [id number]  HPI patient has been bothered with intractable hiccups. He is unable to get relief. He's been on Thorazine 25 mg 4 times a day but continues to have problems we he went to the emergency room and his medications were prescribed there. He has had difficulty with hiccups in the past    Review of Systems     Objective:   Physical Exam Chest is clear to auscultation and percussion heart regular rate no murmurs abdomen soft nontender.  [facility] reading (PRIMARY) by  Dr. Lubertha Sayres is a hiatal hernia present no acute disease I advised the patient 1 Prozine to 30 mg 4 times a day for his hiccups.         Assessment & Plan:     Patient will increase his Prozine at 30 mg 4 times a day. I called and talked to Dr. [name] office they will see him at 1:00 tomorrow.

## 2012-07-30 ENCOUNTER — Other Ambulatory Visit (HOSPITAL_COMMUNITY): Payer: Self-pay | Admitting: Gastroenterology

## 2012-07-30 DIAGNOSIS — K912 Postsurgical malabsorption, not elsewhere classified: Secondary | ICD-10-CM | POA: Diagnosis not present

## 2012-07-30 DIAGNOSIS — R066 Hiccough: Secondary | ICD-10-CM

## 2012-07-30 DIAGNOSIS — K3189 Other diseases of stomach and duodenum: Secondary | ICD-10-CM | POA: Diagnosis not present

## 2012-07-30 DIAGNOSIS — R1013 Epigastric pain: Secondary | ICD-10-CM | POA: Diagnosis not present

## 2012-08-04 DIAGNOSIS — L570 Actinic keratosis: Secondary | ICD-10-CM | POA: Diagnosis not present

## 2012-08-06 ENCOUNTER — Telehealth: Payer: Self-pay | Admitting: Cardiology

## 2012-08-06 ENCOUNTER — Other Ambulatory Visit: Payer: Self-pay | Admitting: *Deleted

## 2012-08-06 MED ORDER — NITROGLYCERIN 0.4 MG SL SUBL: 0.4000 mg | SUBLINGUAL_TABLET | SUBLINGUAL | Status: DC | PRN

## 2012-08-06 NOTE — Telephone Encounter (Signed)
plz return call to pt regarding Nitro refill, he can be reached at [phone]

## 2012-08-07 ENCOUNTER — Encounter (HOSPITAL_COMMUNITY): Payer: Medicare Other

## 2012-08-15 ENCOUNTER — Other Ambulatory Visit: Payer: Self-pay | Admitting: Emergency Medicine

## 2012-08-18 ENCOUNTER — Encounter (HOSPITAL_COMMUNITY): Payer: Self-pay

## 2012-08-18 ENCOUNTER — Encounter (HOSPITAL_COMMUNITY)
Admission: RE | Admit: 2012-08-18 | Discharge: 2012-08-18 | Disposition: A | Discharge status: Home / Self Care | Payer: Medicare Other | Source: Ambulatory Visit | Attending: Gastroenterology | Admitting: Gastroenterology

## 2012-08-18 DIAGNOSIS — R066 Hiccough: Secondary | ICD-10-CM | POA: Insufficient documentation

## 2012-08-18 DIAGNOSIS — R1110 Vomiting, unspecified: Secondary | ICD-10-CM | POA: Diagnosis not present

## 2012-08-18 MED ORDER — TECHNETIUM TC 99M SULFUR COLLOID
2.2000 | Freq: Once | INTRAVENOUS | Status: AC | PRN
  Administered 2012-08-18: 2.2 via INTRAVENOUS

## 2012-09-02 DIAGNOSIS — M5137 Other intervertebral disc degeneration, lumbosacral region: Secondary | ICD-10-CM | POA: Diagnosis not present

## 2012-09-02 DIAGNOSIS — Z981 Arthrodesis status: Secondary | ICD-10-CM | POA: Diagnosis not present

## 2012-09-02 DIAGNOSIS — M545 Low back pain, unspecified: Secondary | ICD-10-CM | POA: Diagnosis not present

## 2012-09-04 DIAGNOSIS — L570 Actinic keratosis: Secondary | ICD-10-CM | POA: Diagnosis not present

## 2012-09-04 DIAGNOSIS — L909 Atrophic disorder of skin, unspecified: Secondary | ICD-10-CM | POA: Diagnosis not present

## 2012-09-04 DIAGNOSIS — L919 Hypertrophic disorder of the skin, unspecified: Secondary | ICD-10-CM | POA: Diagnosis not present

## 2012-09-07 DIAGNOSIS — K912 Postsurgical malabsorption, not elsewhere classified: Secondary | ICD-10-CM | POA: Diagnosis not present

## 2012-09-07 DIAGNOSIS — R066 Hiccough: Secondary | ICD-10-CM | POA: Diagnosis not present

## 2012-09-08 ENCOUNTER — Other Ambulatory Visit: Payer: Self-pay | Admitting: *Deleted

## 2012-09-08 MED ORDER — LOSARTAN POTASSIUM-HCTZ 100-25 MG PO TABS: 1.0000 | ORAL_TABLET | Freq: Every day | ORAL | Status: DC

## 2012-09-16 ENCOUNTER — Ambulatory Visit (INDEPENDENT_AMBULATORY_CARE_PROVIDER_SITE_OTHER): Payer: Medicare Other | Admitting: Family Medicine

## 2012-09-16 VITALS — BP 127/84 | HR 86 | Temp 98.2°F | Resp 18 | Ht 66.0 in | Wt 209.2 lb

## 2012-09-16 DIAGNOSIS — R319 Hematuria, unspecified: Secondary | ICD-10-CM

## 2012-09-16 DIAGNOSIS — J029 Acute pharyngitis, unspecified: Secondary | ICD-10-CM | POA: Diagnosis not present

## 2012-09-16 DIAGNOSIS — H6120 Impacted cerumen, unspecified ear: Secondary | ICD-10-CM | POA: Diagnosis not present

## 2012-09-16 LAB — POCT CBC
HCT, POC: 50.3 % (ref 43.5–53.7)
Hemoglobin: 16 g/dL (ref 14.1–18.1)
MCH, POC: 31.1 pg (ref 27–31.2)
MCV: 97.8 fL — AB (ref 80–97)
MPV: 9.2 fL (ref 0–99.8)
POC MID %: 15.2 %M — AB (ref 0–12)
RBC: 5.14 M/uL (ref 4.69–6.13)
WBC: 9.7 10*3/uL (ref 4.6–10.2)

## 2012-09-16 LAB — BASIC METABOLIC PANEL
CO2: 28 mEq/L (ref 19–32)
Calcium: 9.6 mg/dL (ref 8.4–10.5)
Glucose, Bld: 132 mg/dL — ABNORMAL HIGH (ref 70–99)
Potassium: 4.1 mEq/L (ref 3.5–5.3)
Sodium: 135 mEq/L (ref 135–145)

## 2012-09-16 LAB — POCT UA - MICROSCOPIC ONLY: Crystals, Ur, HPF, POC: NEGATIVE

## 2012-09-16 LAB — POCT URINALYSIS DIPSTICK
Ketones, UA: NEGATIVE
Protein, UA: 100
Spec Grav, UA: >=1.03

## 2012-09-16 NOTE — Patient Instructions (Addendum)
I will get you scheduled to see urology- we will give you a call.  If you develop any other symptoms such as pain or fever let us know.

## 2012-09-16 NOTE — Progress Notes (Signed)
[facility] [address], [place] [address] [phone]  Date:  09/16/2012   Name:  [name]   DOB:  1945-11-17   MRN:  [id number]  PCP:  [name], MD    Chief Complaint: Sore Throat, Otalgia and Hematuria   History of Present Illness:  [name] is a 67 y.o. very pleasant male patient who presents with the following:  He is here today with illness.  He notes a ST, earache and ears popping for about 3 days.  Last night he noted gross hematuria- again this morning.  He has never had this before.  He does not have any pain.  His urine has been "red."  He has not noted any blood clots.  He has no unusual back pain- he had back surgery a few months ago so he always has back pain.  No nausea or vomiting.  No cough He has not noted a fever.  No aches or chills  He has never been a smoker.    Patient Active Problem List  Diagnosis  . History of seizure disorder  . History of atrial flutter  . Depression  . Back pain  . Hearing loss  . Arrhythmia  . Diverticulitis  . H/O alcohol abuse  . CAD (coronary artery disease)  . HTN (hypertension)  . Diabetes mellitus  . Seizure disorder  . Dysphagia, unspecified    Past Medical History  Diagnosis Date  . Coronary artery disease   . Hyperlipidemia   . Hypertension   . History of seizure disorder   . History of atrial flutter   . Depression   . Back pain     persistent  . Hearing loss   . Diverticulitis   . H/O alcohol abuse   . Diabetes mellitus     controled by diet  . GERD (gastroesophageal reflux disease)   . Seizures   . Headache   . Arthritis     of spine  . Sleep apnea     does not use CPAP    Past Surgical History  Procedure Date  . Partial colectomy     for diverticuli  . Orchiectomy   . Umbilical hernia repair   . Cervical fusion   . Coronary artery bypass graft 2005    LIMA graft to LAD,saphenous vein graft to diag.,circumflex, marginal,and to the RCA  .  Cardiac catheterization   . Appendectomy   . Esophagogastroduodenoscopy 02/10/2012    Procedure: ESOPHAGOGASTRODUODENOSCOPY (EGD);  Surgeon: Cleotis Nipper, MD;  Location: [facility] ENDOSCOPY;  Service: Endoscopy;  Laterality: N/A;  . Foreign body removal 02/10/2012    Procedure: FOREIGN BODY REMOVAL;  Surgeon: Cleotis Nipper, MD;  Location: [place];  Service: Endoscopy;  Laterality: N/A;  . Back surgery   . Esophagogastroduodenoscopy 06/29/2012    Procedure: ESOPHAGOGASTRODUODENOSCOPY (EGD);  Surgeon: Winfield Cunas., MD;  Location: [facility] ENDOSCOPY;  Service: Endoscopy;  Laterality: N/A;  . Foreign body removal 06/29/2012    Procedure: FOREIGN BODY REMOVAL;  Surgeon: Winfield Cunas., MD;  Location: [facility] ENDOSCOPY;  Service: Endoscopy;  Laterality: N/A;  . Savory dilation 06/29/2012    Procedure: SAVORY DILATION;  Surgeon: Winfield Cunas., MD;  Location: [facility] ENDOSCOPY;  Service: Endoscopy;  Laterality: N/A;    History  Substance Use Topics  . Smoking status: Never Smoker   . Smokeless tobacco: Never Used  . Alcohol Use: No     Comment: rare  beer    No family history on file.  Allergies  Allergen Reactions  . Cephalexin     Unknown  . Doxycycline     Unknown  . Pentazocine Lactate     Unknown    Medication list has been reviewed and updated.  Current Outpatient Prescriptions on File Prior to Visit  Medication Sig Dispense Refill  . aspirin 325 MG tablet Take 325 mg by mouth daily.      Marland Kitchen atorvastatin (LIPITOR) 80 MG tablet Take 1 tablet (80 mg total) by mouth at bedtime.  30 tablet  3  . dexlansoprazole (DEXILANT) 60 MG capsule Take 60 mg by mouth daily.      . fish oil-omega-3 fatty acids 1000 MG capsule Take 1 g by mouth daily.      Marland Kitchen levETIRAcetam (KEPPRA) 750 MG tablet TAKE 1 TABLET BY MOUTH TWICE DAILY  60 tablet  0  . LORazepam (ATIVAN) 1 MG tablet Take 1 mg by mouth every 6 (six) hours as needed. For anxiety      . losartan-hydrochlorothiazide (HYZAAR) 100-25 MG  per tablet Take 1 tablet by mouth daily.  30 tablet  0  . metoprolol (LOPRESSOR) 50 MG tablet TAKE 1 TABLET BY MOUTH TWICE DAILY  60 tablet  0  . metroNIDAZOLE (FLAGYL) 500 MG tablet Take 500 mg by mouth every 7 (seven) days. SATURDAY      . Multiple Vitamins-Minerals (MULTIVITAMIN WITH MINERALS) tablet Take 1 tablet by mouth daily.      . nitroGLYCERIN (NITROSTAT) 0.4 MG SL tablet Place 1 tablet (0.4 mg total) under the tongue every 5 (five) minutes as needed. For chest pain  25 tablet  6  . omeprazole (PRILOSEC) 20 MG capsule Take 40 mg by mouth 2 (two) times daily.       Marland Kitchen testosterone cypionate (DEPOTESTOTERONE CYPIONATE) 200 MG/ML injection Inject into the muscle every 14 (fourteen) days.      . traMADol (ULTRAM) 50 MG tablet Take 50 mg by mouth every 8 (eight) hours as needed.      Marland Kitchen azelastine (ASTELIN) 137 MCG/SPRAY nasal spray Place 2 sprays into the nose 2 (two) times daily. Use in each nostril as directed      . chlorproMAZINE (THORAZINE) 10 MG tablet Take 2.5 tablets (25 mg total) by mouth 4 (four) times daily.  20 tablet  0  . cloNIDine (CATAPRES) 0.1 MG tablet Take 1 tablet (0.1 mg total) by mouth every 8 (eight) hours as needed.  9 tablet  0  . etodolac (LODINE) 500 MG tablet Take 500 mg by mouth 2 (two) times daily.      . polyethylene glycol (MIRALAX / GLYCOLAX) packet Take 17 g by mouth daily.      . sucralfate (CARAFATE) 1 G tablet Take 1 tablet (1 g total) by mouth 4 (four) times daily. Ac and hs  120 tablet  0  . zolpidem (AMBIEN) 10 MG tablet Take 10 mg by mouth at bedtime as needed.        Review of Systems:  As per HPI- otherwise negative. He is SA and reports the same partner for "45 years"- he does not think there is any risk of STI  Physical Examination: Filed Vitals:   09/16/12 0756  BP: 127/84  Pulse: 86  Temp: 98.2 F (36.8 C)  Resp: 18   Filed Vitals:   09/16/12 0756  Height: 5' 6"  (1.676 m)  Weight: 209 lb 3.2 oz (94.892 kg)   Body mass index  is  33.77 kg/(m^2). Ideal Body Weight: Weight in (lb) to have BMI = 25: 154.6   GEN: WDWN, NAD, Non-toxic, A & O x 3 HEENT: Atraumatic, Normocephalic. Neck supple. No masses, No LAD. Bilateral TM obscured by cerumen, oropharynx normal.  PEERL,EOMI.   Cerumen impaction bilaterally Ears and Nose: No external deformity. CV: RRR, No M/G/R. No JVD. No thrill. No extra heart sounds. PULM: CTA B, no wheezes, crackles, rhonchi. No retractions. No resp. distress. No accessory muscle use. ABD: S, NT, ND, +BS. No rebound. No HSM. EXTR: No c/c/e No CVA tenderness Multiple surgical scars- from heart surgery, colectomy and lumbar spine surgery NEURO Normal gait.  PSYCH: Normally interactive. Conversant. Not depressed or anxious appearing.  Calm demeanor.  GU: blood visible dried on underwear.  No other penile lesion or scrotal mass Prostate gland non- tender  Cerumen removed by irrigation.  TM then normal.  He does use hearing aids for [facility]  Results for orders placed in visit on 09/16/12  POCT CBC      Component Value Range   WBC 9.7  4.6 - 10.2 K/uL   Lymph, poc 1.7  0.6 - 3.4   POC LYMPH PERCENT 17.4  10 - 50 %L   MID (cbc) 1.5 (*) 0 - 0.9   POC MID % 15.2 (*) 0 - 12 %M   POC Granulocyte 6.5  2 - 6.9   Granulocyte percent 67.4  37 - 80 %G   RBC 5.14  4.69 - 6.13 M/uL   Hemoglobin 16.0  14.1 - 18.1 g/dL   HCT, POC 50.3  43.5 - 53.7 %   MCV 97.8 (*) 80 - 97 fL   MCH, POC 31.1  27 - 31.2 pg   MCHC 31.8  31.8 - 35.4 g/dL   RDW, POC 15.3     Platelet Count, POC 270  142 - 424 K/uL   MPV 9.2  0 - 99.8 fL  POCT RAPID STREP A (OFFICE)      Component Value Range   Rapid Strep A Screen Negative  Negative  POCT UA - MICROSCOPIC ONLY      Component Value Range   WBC, Ur, HPF, POC neg     RBC, urine, microscopic 8-12     Bacteria, U Microscopic trace     Mucus, UA neg     Epithelial cells, urine per micros 0-1     Crystals, Ur, HPF, POC neg     Casts, Ur, LPF, POC neg     Yeast, UA neg    POCT  URINALYSIS DIPSTICK      Component Value Range   Color, UA amber     Clarity, UA clear     Glucose, UA neg     Bilirubin, UA neg     Ketones, UA neg     Spec Grav, UA >=1.030     Blood, UA large     pH, UA 5.5     Protein, UA 100     Urobilinogen, UA 0.2     Nitrite, UA neg     Leukocytes, UA Trace     Assessment and Plan: 1. Hematuria  POCT CBC, POCT UA - Microscopic Only, POCT urinalysis dipstick, Basic metabolic panel, Urine culture  2. Sore throat  POCT rapid strep A  3. Cerumen impaction     URI- reassurance.  He will continue OTC medications as needed and let us know if not better Cerumen impaction resolved. Gross hematuria- urine culture and  BMP pending as above.  Will refer to urology for evaluation.  He has never been a smoker.  He has an appt at [facility] this Friday- appreciate consultation.   Ok to LM on cell phone.  Lamar Blinks, MD

## 2012-09-18 DIAGNOSIS — G4730 Sleep apnea, unspecified: Secondary | ICD-10-CM | POA: Diagnosis not present

## 2012-09-18 DIAGNOSIS — R404 Transient alteration of awareness: Secondary | ICD-10-CM | POA: Diagnosis not present

## 2012-09-18 DIAGNOSIS — R310 Gross hematuria: Secondary | ICD-10-CM | POA: Diagnosis not present

## 2012-09-18 DIAGNOSIS — N529 Male erectile dysfunction, unspecified: Secondary | ICD-10-CM | POA: Diagnosis not present

## 2012-09-18 DIAGNOSIS — IMO0002 Reserved for concepts with insufficient information to code with codable children: Secondary | ICD-10-CM | POA: Diagnosis not present

## 2012-09-18 DIAGNOSIS — G4700 Insomnia, unspecified: Secondary | ICD-10-CM | POA: Diagnosis not present

## 2012-09-20 ENCOUNTER — Encounter: Payer: Self-pay | Admitting: Radiology

## 2012-09-28 DIAGNOSIS — R310 Gross hematuria: Secondary | ICD-10-CM | POA: Diagnosis not present

## 2012-09-28 DIAGNOSIS — N200 Calculus of kidney: Secondary | ICD-10-CM | POA: Diagnosis not present

## 2012-09-28 DIAGNOSIS — N529 Male erectile dysfunction, unspecified: Secondary | ICD-10-CM | POA: Diagnosis not present

## 2012-10-10 ENCOUNTER — Other Ambulatory Visit: Payer: Self-pay

## 2012-10-11 ENCOUNTER — Other Ambulatory Visit: Payer: Self-pay | Admitting: Physician Assistant

## 2012-10-15 ENCOUNTER — Telehealth: Payer: Self-pay | Admitting: *Deleted

## 2012-10-15 MED ORDER — LOSARTAN POTASSIUM-HCTZ 100-25 MG PO TABS: 1.0000 | ORAL_TABLET | Freq: Every day | ORAL | Status: DC

## 2012-10-15 NOTE — Telephone Encounter (Signed)
Follow-up:    Patient called in and scheduled an appointment with Richardson Dopp on 10/20/12.  Patient would like enough medication to last to his next appt called in to the pharmacy listed on his profile.  Please call back if you have any questions.

## 2012-10-15 NOTE — Telephone Encounter (Signed)
Refill answering machine Returning patient call about refill of Losartan 100/25. Patient must first make an appointment, has not been seen. Left message for patient to please call Dr Doug Sou office at [phone] [place]

## 2012-10-20 ENCOUNTER — Encounter: Payer: Self-pay | Admitting: Physician Assistant

## 2012-10-20 ENCOUNTER — Ambulatory Visit (INDEPENDENT_AMBULATORY_CARE_PROVIDER_SITE_OTHER): Payer: Medicare Other | Admitting: Physician Assistant

## 2012-10-20 VITALS — BP 112/86 | HR 83 | Ht 66.0 in | Wt 216.4 lb

## 2012-10-20 DIAGNOSIS — R066 Hiccough: Secondary | ICD-10-CM | POA: Diagnosis not present

## 2012-10-20 DIAGNOSIS — E7800 Pure hypercholesterolemia, unspecified: Secondary | ICD-10-CM | POA: Diagnosis not present

## 2012-10-20 DIAGNOSIS — R079 Chest pain, unspecified: Secondary | ICD-10-CM

## 2012-10-20 NOTE — Patient Instructions (Signed)
**Note De-Identified  Obfuscation** Your physician has requested that you have a lexiscan myoview. For further information please visit [email or web address]. Please follow instruction sheet, as given.  Your physician recommends that you continue on your current medications as directed. Please refer to the Current Medication list given to you today.   Your physician wants you to follow-up in: 1 year. You will receive a reminder letter in the mail two months in advance. If you don't receive a letter, please call our office to schedule the follow-up appointment.

## 2012-10-20 NOTE — Progress Notes (Signed)
[address], [place], [place]  [address] Phone: [phone], Fax:  [phone]  Date:  10/20/2012   ID:  [name], [name] 25-Sep-1945, MRN [id number]  PCP:  [name], MD And Dr. Marcelle Overlie at the [facility] Primary Cardiologist:  Dr. Peter Martinique     History of Present Illness: [name] is a 67 y.o. male who returns for followup.  He has a hx of CAD, s/p CABG in 2005, HTN, HL, seizure d/o, AFlutter, diverticulosis s/p partial colectomy.  Echo 2/08: EF 55-60%, mild increased AV thickness, echo free space adjacent to apex c/w small eff vs fat pad.  Nuclear study 09/2008: normal.  Last seen by Dr. Peter Martinique in 05/2011.  He was cleared for urologic surgery at that time.  Since that time, he was admitted for syncope in 8/13 thought to be related to seizure activity and he was started on Keppra at that time.  He has also been seen in the ED with intractable hiccups.   He does note occasional chest heaviness with exertion. He has difficulty relating this to his hiccups. He also notes dyspnea with his hiccups. He denies orthopnea, PND. He does have chronic pedal edema without significant change. He has chronic dyspnea with exertion. He describes NYHA class II symptoms. He denies syncope. He does get lightheaded sometimes with intractable hiccups.  Labs (9/13):  K 3.6, creatinine 1.12, ALT 20 Labs (1/14):  K 4.1, creatinine 0.99, Hgb 16 Labs (2/14):  K 3.8, creatinine 1.2, ALT 44, A1c 7.1, TC 199, TG 431, HDL 39, LDL 105  Wt Readings from Last 3 Encounters:  09/16/12 209 lb 3.2 oz (94.892 kg)  07/29/12 208 lb (94.348 kg)  07/08/12 197 lb (89.359 kg)     Past Medical History  Diagnosis Date  . Coronary artery disease   . Hyperlipidemia   . Hypertension   . History of seizure disorder   . History of atrial flutter   . Depression   . Back pain     persistent  . Hearing loss   . Diverticulitis   . H/O alcohol abuse   . Diabetes mellitus    controled by diet  . GERD (gastroesophageal reflux disease)   . Seizures   . Headache   . Arthritis     of spine  . Sleep apnea     does not use CPAP    Past Surgical History  Procedure Laterality Date  . Partial colectomy      for diverticuli  . Orchiectomy    . Umbilical hernia repair    . Cervical fusion    . Coronary artery bypass graft  2005    LIMA graft to LAD,saphenous vein graft to diag.,circumflex, marginal,and to the RCA  . Cardiac catheterization    . Appendectomy    . Esophagogastroduodenoscopy  02/10/2012    Procedure: ESOPHAGOGASTRODUODENOSCOPY (EGD);  Surgeon: Cleotis Nipper, MD;  Location: [facility] ENDOSCOPY;  Service: Endoscopy;  Laterality: N/A;  . Foreign body removal  02/10/2012    Procedure: FOREIGN BODY REMOVAL;  Surgeon: Cleotis Nipper, MD;  Location: [place];  Service: Endoscopy;  Laterality: N/A;  . Back surgery    . Esophagogastroduodenoscopy  06/29/2012    Procedure: ESOPHAGOGASTRODUODENOSCOPY (EGD);  Surgeon: Winfield Cunas., MD;  Location: [facility] ENDOSCOPY;  Service: Endoscopy;  Laterality: N/A;  . Foreign body removal  06/29/2012    Procedure: FOREIGN BODY REMOVAL;  Surgeon: Winfield Cunas., MD;  Location: [facility] ENDOSCOPY;  Service: Endoscopy;  Laterality: N/A;  . Savory dilation  06/29/2012    Procedure: SAVORY DILATION;  Surgeon: Winfield Cunas., MD;  Location: [facility] ENDOSCOPY;  Service: Endoscopy;  Laterality: N/A;    Current Outpatient Prescriptions  Medication Sig Dispense Refill  . aspirin 325 MG tablet Take 325 mg by mouth daily.      Marland Kitchen atorvastatin (LIPITOR) 80 MG tablet Take 1 tablet (80 mg total) by mouth at bedtime.  30 tablet  3  . azelastine (ASTELIN) 137 MCG/SPRAY nasal spray Place 2 sprays into the nose 2 (two) times daily. Use in each nostril as directed      . chlorproMAZINE (THORAZINE) 10 MG tablet Take 2.5 tablets (25 mg total) by mouth 4 (four) times daily.  20 tablet  0  . cloNIDine (CATAPRES) 0.1 MG tablet Take 1 tablet (0.1  mg total) by mouth every 8 (eight) hours as needed.  9 tablet  0  . dexlansoprazole (DEXILANT) 60 MG capsule Take 60 mg by mouth daily.      Marland Kitchen etodolac (LODINE) 500 MG tablet Take 500 mg by mouth 2 (two) times daily.      . fish oil-omega-3 fatty acids 1000 MG capsule Take 1 g by mouth daily.      Marland Kitchen levETIRAcetam (KEPPRA) 750 MG tablet TAKE 1 TABLET BY MOUTH TWICE DAILY  60 tablet  0  . LORazepam (ATIVAN) 1 MG tablet Take 1 mg by mouth every 6 (six) hours as needed. For anxiety      . losartan-hydrochlorothiazide (HYZAAR) 100-25 MG per tablet Take 1 tablet by mouth daily.  30 tablet  0  . metoprolol (LOPRESSOR) 50 MG tablet TAKE 1 TABLET BY MOUTH TWICE DAILY  60 tablet  0  . metroNIDAZOLE (FLAGYL) 500 MG tablet Take 500 mg by mouth every 7 (seven) days. SATURDAY      . Multiple Vitamins-Minerals (MULTIVITAMIN WITH MINERALS) tablet Take 1 tablet by mouth daily.      . nitroGLYCERIN (NITROSTAT) 0.4 MG SL tablet Place 1 tablet (0.4 mg total) under the tongue every 5 (five) minutes as needed. For chest pain  25 tablet  6  . omeprazole (PRILOSEC) 20 MG capsule Take 40 mg by mouth 2 (two) times daily.       . polyethylene glycol (MIRALAX / GLYCOLAX) packet Take 17 g by mouth daily.      . sucralfate (CARAFATE) 1 G tablet Take 1 tablet (1 g total) by mouth 4 (four) times daily. Ac and hs  120 tablet  0  . testosterone cypionate (DEPOTESTOTERONE CYPIONATE) 200 MG/ML injection Inject into the muscle every 14 (fourteen) days.      . traMADol (ULTRAM) 50 MG tablet Take 50 mg by mouth every 8 (eight) hours as needed.      . zolpidem (AMBIEN) 10 MG tablet Take 10 mg by mouth at bedtime as needed.       No current facility-administered medications for this visit.    Allergies:    Allergies  Allergen Reactions  . Cephalexin     Unknown  . Doxycycline     Unknown  . Pentazocine Lactate     Unknown    Social History:  The patient  reports that he has never smoked. He has never used smokeless tobacco.  He reports that he does not drink alcohol or use illicit drugs.   ROS:  Please see the history of present illness.   He has noted some bright red  blood per rectum that he attributes to hemorrhoidal bleeding   All other systems reviewed and negative.   PHYSICAL EXAM: VS:  BP 112/86  Pulse 83  Ht 5' 6"  (1.676 m)  Wt 216 lb 6.4 oz (98.158 kg)  BMI 34.94 kg/m2 Well nourished, well developed, in no acute distress HEENT: normal Neck: no JVD Vascular: No carotid bruits Cardiac:  normal S1, S2; RRR; no murmur Lungs:  clear to auscultation bilaterally, no wheezing, rhonchi or rales Abd: soft, nontender, no hepatomegaly Ext: no edema Skin: warm and dry Neuro:  CNs 2-12 intact, no focal abnormalities noted  EKG:  NSR, HR 83, normal axis, poor R wave progression, nonspecific ST-T wave changes     ASSESSMENT AND PLAN:  1. Chest Pain:  He does note some exertional chest discomfort. He has a difficult time separating this from his hiccups. It has been several years since his last stress test. I will arrange a Lexiscan Myoview (he cannot walk on a treadmill due to her chronic back pain). 2. Coronary Artery Disease:  Continue aspirin and statin. Proceed with Myoview as noted. 3. Hypertension: Controlled. 4. Hyperlipidemia: Triglycerides are elevated. He has a followup with his PCP at the [place] this week to address this. 5. Diabetes Mellitus: We discussed the importance of treating diabetes in controlling his coronary artery disease. 6. Disposition: Followup with Dr. Martinique in one year.  Signed, Richardson Dopp, PA-C  8:20 AM 10/20/2012

## 2012-10-26 ENCOUNTER — Ambulatory Visit (HOSPITAL_COMMUNITY): Payer: Medicare Other | Attending: Cardiovascular Disease | Admitting: Radiology

## 2012-10-26 VITALS — BP 110/75 | HR 78 | Ht 67.0 in | Wt 214.0 lb

## 2012-10-26 DIAGNOSIS — E119 Type 2 diabetes mellitus without complications: Secondary | ICD-10-CM | POA: Insufficient documentation

## 2012-10-26 DIAGNOSIS — R0789 Other chest pain: Secondary | ICD-10-CM | POA: Diagnosis not present

## 2012-10-26 DIAGNOSIS — R0609 Other forms of dyspnea: Secondary | ICD-10-CM | POA: Diagnosis not present

## 2012-10-26 DIAGNOSIS — R0989 Other specified symptoms and signs involving the circulatory and respiratory systems: Secondary | ICD-10-CM | POA: Insufficient documentation

## 2012-10-26 DIAGNOSIS — I10 Essential (primary) hypertension: Secondary | ICD-10-CM | POA: Diagnosis not present

## 2012-10-26 MED ORDER — TECHNETIUM TC 99M SESTAMIBI GENERIC - CARDIOLITE
11.0000 | Freq: Once | INTRAVENOUS | Status: AC | PRN
  Administered 2012-10-26: 11 via INTRAVENOUS

## 2012-10-26 MED ORDER — REGADENOSON 0.4 MG/5ML IV SOLN
0.4000 mg | Freq: Once | INTRAVENOUS | Status: AC
  Administered 2012-10-26: 0.4 mg via INTRAVENOUS

## 2012-10-26 MED ORDER — TECHNETIUM TC 99M SESTAMIBI GENERIC - CARDIOLITE
33.0000 | Freq: Once | INTRAVENOUS | Status: AC | PRN
  Administered 2012-10-26: 33 via INTRAVENOUS

## 2012-10-26 NOTE — Progress Notes (Signed)
[facility] [address] [place], [place] [address] [phone]    Cardiology Nuclear Med Study  ANGELLO CHIEN is a 67 y.o. male     MRN : [id number]     DOB: 23-Dec-1945  Procedure Date: 10/26/2012  Nuclear Med Background Indication for Stress Test:  Evaluation for Ischemia and Graft Patency History:  '05 CABG; '08 Echo:EF=60%; '10 EXH:BZJIRC; h/o atrial flutter Cardiac Risk Factors: Hypertension, Lipids, NIDDM and Obesity  Symptoms:  Chest Pressure with Exertion (last episode of chest discomfort is now, 2-3/10), DOE, Nausea and SOB with hiccups   Nuclear Pre-Procedure Caffeine/Decaff Intake:  None NPO After: 6 pm   Lungs:  Clear. O2 Sat: 95% on room air. IV 0.9% NS with Angio Cath:  22g  IV Site: R Hand  IV Started by:  Matilde Haymaker, RN  Chest Size (in):  44 Cup Size: n/a  Height: 5' 7"  (1.702 m)  Weight:  214 lb (97.07 kg)  BMI:  Body mass index is 33.51 kg/(m^2). Tech Comments:  Rx this am    Nuclear Med Study 1 or 2 day study: 1 day  Stress Test Type:  Treadmill/Lexiscan  Reading MD: Dola Argyle, MD  Order Authorizing Provider:  Peter Martinique MD;  Richardson Dopp, PA-C  Resting Radionuclide: Technetium 21mSestamibi  Resting Radionuclide Dose: 11.0 mCi   Stress Radionuclide:  Technetium 947mestamibi  Stress Radionuclide Dose: 32.8 mCi           Stress Protocol Rest HR: 78 Stress HR: 114  Rest BP: 110/75 Stress BP: 153/75  Exercise Time (min): 2:00 METS: n/a   Predicted Max HR: 152 bpm % Max HR: 75 bpm Rate Pressure Product: 17442   Dose of Adenosine (mg):  n/a Dose of Lexiscan: 0.4 mg  Dose of Atropine (mg): n/a Dose of Dobutamine: n/a mcg/kg/min (at max HR)  Stress Test Technologist: ShLetta MoynahanCMA-N  Nuclear Technologist:  StCharlton AmorCNMT     Rest Procedure:  Myocardial perfusion imaging was performed at rest 45 minutes following the intravenous administration of Technetium 9919mstamibi.  Rest ECG: decreased  anterior R wave progression  Stress Procedure:  The patient received IV Lexiscan 0.4 mg over 15-seconds with concurrent low level exercise and then Technetium 49m73mtamibi was injected at 30-seconds while the patient continued walking one more minute.  Quantitative spect images were obtained after a 45-minute delay.  Stress ECG: No significant change from baseline ECG  QPS Raw Data Images:  Normal; no motion artifact; normal heart/lung ratio. Stress Images:  There is a small area of mild decreased uptake at the base of the inferior wall. Rest Images:  Small area of mild decreased uptake at the base of the inferior wall. Subtraction (SDS):  No evidence of ischemia. Transient Ischemic Dilatation (Normal <1.22):  0.96 Lung/Heart Ratio (Normal <0.45):  0.46  Quantitative Gated Spect Images QGS EDV:  83 ml QGS ESV:  32 ml  Impression Exercise Capacity:  Lexiscan with low level exercise. BP Response:  Normal blood pressure response. Clinical Symptoms:  No symptoms. ECG Impression:  No significant ST segment change suggestive of ischemia. Comparison with Prior Nuclear Study: No images to compare  Overall Impression:  Low risk stress nuclear study. There is a small area of decreased uptake at the base of the inferior wall. This may be related to diaphragmatic attenuation or artifact. There could be very small area of scar. There is no significant ischemia.  LV Ejection Fraction: 62%.  LV Wall Motion:  There is slight decreased motion at the base of the inferior wall.  Dola Argyle, MD

## 2012-10-27 ENCOUNTER — Encounter: Payer: Self-pay | Admitting: Physician Assistant

## 2012-10-28 ENCOUNTER — Other Ambulatory Visit: Payer: Self-pay | Admitting: Physician Assistant

## 2012-10-28 ENCOUNTER — Telehealth: Payer: Self-pay | Admitting: *Deleted

## 2012-10-28 NOTE — Telephone Encounter (Signed)
Message copied by Michae Kava on Wed Oct 28, 2012  6:01 PM ------      Message from: [place], [place] T      Created: Tue Oct 27, 2012  3:40 PM       Low risk stress test.        No significant ischemia.  Normal EF.      Continue with current treatment plan.      Richardson Dopp, PA-C  3:40 PM 10/27/2012 ------

## 2012-10-28 NOTE — Telephone Encounter (Signed)
pt notified about myoview results with verbal understanding today. pt aksed if PA had s/w Dr. Martinique about maybe needing to take lasix. I stated I was not sure but I will find out for him, either Debbie L. LPN or I w/cb friday pt said ok

## 2012-11-03 ENCOUNTER — Telehealth: Payer: Self-pay

## 2012-11-03 MED ORDER — ATORVASTATIN CALCIUM 80 MG PO TABS: 80.0000 mg | ORAL_TABLET | Freq: Every day | ORAL | Status: DC

## 2012-11-03 NOTE — Telephone Encounter (Signed)
Sent in called him to advise

## 2012-11-03 NOTE — Telephone Encounter (Signed)
Pt needs a refill of his Lipitor. He has made a follow-up appt with dr. [name] for 11/24/12 He uses the [facility] on [address] [phone]

## 2012-11-04 ENCOUNTER — Telehealth: Payer: Self-pay | Admitting: *Deleted

## 2012-11-04 NOTE — Telephone Encounter (Signed)
lmptcb to go over recommendations about lasix; see notes from Richardson Dopp, [place] on [place] results

## 2012-11-04 NOTE — Telephone Encounter (Signed)
Message copied by Michae Kava on Wed Nov 04, 2012  5:03 PM ------      Message from: [place], [place] T      Created: Tue Nov 03, 2012  5:32 PM       He takes Hyzaar already which has HCTZ in it.      Ok to call in Lasix 20 mg QD prn increased swelling, #15, 1 R.      I do not want him to take this often though.  Lasix and HCTZ increase diuresis together.  Could make him dehydrated if he takes together a lot.      If he takes more than once a week, needs to call to get a BMET.      Ok to refill Lipitor.      Richardson Dopp, PA-C  5:31 PM 11/03/2012 ------

## 2012-11-05 MED ORDER — FUROSEMIDE 20 MG PO TABS: 20.0000 mg | ORAL_TABLET | Freq: Every day | ORAL | Status: DC

## 2012-11-05 NOTE — Telephone Encounter (Signed)
11-05-12 pt rtn call from carol, pls call [phone]

## 2012-11-05 NOTE — Telephone Encounter (Signed)
pt notified ok to have PRN lasix 20 mg, however if uses more than x1 weekly needs tcb to have bmet done. pt on hyzaar-hct already, pt verbalized understanding today., rx sent in for lasix 20 PRN #15 x 1

## 2012-11-12 ENCOUNTER — Telehealth: Payer: Self-pay

## 2012-11-12 NOTE — Telephone Encounter (Signed)
Called patient. Advised to call his cardiologist to renew his medication. He stated he thought he was calling that office when he originally called this afternoon. Pt will call them in the morning and get refills on his Lopressor.

## 2012-11-12 NOTE — Telephone Encounter (Signed)
Patient called requesting a refill on metoprolol 50 mg states he has been out of medication for two days. Patient would like medication sent to [facility] located @ [employer]. Any further questions contact patient at [phone].  Thank You

## 2012-11-13 ENCOUNTER — Other Ambulatory Visit: Payer: Self-pay | Admitting: *Deleted

## 2012-11-13 MED ORDER — METOPROLOL TARTRATE 50 MG PO TABS: ORAL_TABLET | ORAL | Status: DC

## 2012-11-17 DIAGNOSIS — R066 Hiccough: Secondary | ICD-10-CM | POA: Diagnosis not present

## 2012-11-22 ENCOUNTER — Ambulatory Visit (INDEPENDENT_AMBULATORY_CARE_PROVIDER_SITE_OTHER): Payer: Medicare Other | Admitting: Emergency Medicine

## 2012-11-22 VITALS — BP 138/83 | HR 69 | Temp 97.5°F | Resp 16 | Ht 66.0 in | Wt 212.0 lb

## 2012-11-22 DIAGNOSIS — R066 Hiccough: Secondary | ICD-10-CM

## 2012-11-22 DIAGNOSIS — F411 Generalized anxiety disorder: Secondary | ICD-10-CM | POA: Diagnosis not present

## 2012-11-22 NOTE — Progress Notes (Signed)
  Subjective:    Patient ID: [name], male    DOB: Jun 25, 1946, 67 y.o.   MRN: [id number]  HPI  Patient comes into our office today because he just wanted to update me on his CT of his chest and brain because of hiccups.  He want to discuss medicine that he use to be on and he doesn't need any refills at this time of any of his medicine  Review of Systems     Objective:   Physical Exam HEENT exam is unremarkable except for hearing aids his chest is clear heart regular rate no murmurs patient did not have any hiccups when I was in the room. He brings a scan with him it shows previous CVAs and scarring of the pleura related to probable asbestosis         Assessment & Plan:  I recommend he talk with his doctor at the [place] regarding his medications I told him they would have to decide whether it would be safe for him to take Ativan again. He is going to check with his GI doctor about whether he needs to have a pulmonary specialist followup on his abnormal CT.

## 2012-11-22 NOTE — Progress Notes (Signed)
  Subjective:    Patient ID: [name], male    DOB: [birth year], 67 y.o.   MRN: [id number]  HPI    Review of Systems     Objective:   Physical Exam        Assessment & Plan:

## 2012-11-23 ENCOUNTER — Other Ambulatory Visit: Payer: Self-pay | Admitting: *Deleted

## 2012-11-23 MED ORDER — LOSARTAN POTASSIUM-HCTZ 100-25 MG PO TABS: 1.0000 | ORAL_TABLET | Freq: Every day | ORAL | Status: DC

## 2012-11-24 ENCOUNTER — Ambulatory Visit: Payer: Medicare Other | Admitting: Emergency Medicine

## 2012-12-02 DIAGNOSIS — R918 Other nonspecific abnormal finding of lung field: Secondary | ICD-10-CM | POA: Diagnosis not present

## 2012-12-15 DIAGNOSIS — R066 Hiccough: Secondary | ICD-10-CM | POA: Diagnosis not present

## 2012-12-21 ENCOUNTER — Other Ambulatory Visit: Payer: Self-pay | Admitting: Physician Assistant

## 2012-12-23 DIAGNOSIS — N529 Male erectile dysfunction, unspecified: Secondary | ICD-10-CM | POA: Diagnosis not present

## 2012-12-23 DIAGNOSIS — E291 Testicular hypofunction: Secondary | ICD-10-CM | POA: Diagnosis not present

## 2013-01-04 DIAGNOSIS — R918 Other nonspecific abnormal finding of lung field: Secondary | ICD-10-CM | POA: Diagnosis not present

## 2013-01-04 DIAGNOSIS — E669 Obesity, unspecified: Secondary | ICD-10-CM | POA: Diagnosis not present

## 2013-01-04 DIAGNOSIS — R5383 Other fatigue: Secondary | ICD-10-CM | POA: Diagnosis not present

## 2013-01-04 DIAGNOSIS — R0989 Other specified symptoms and signs involving the circulatory and respiratory systems: Secondary | ICD-10-CM | POA: Diagnosis not present

## 2013-01-04 DIAGNOSIS — R0609 Other forms of dyspnea: Secondary | ICD-10-CM | POA: Diagnosis not present

## 2013-01-04 DIAGNOSIS — R5381 Other malaise: Secondary | ICD-10-CM | POA: Diagnosis not present

## 2013-01-04 DIAGNOSIS — G4733 Obstructive sleep apnea (adult) (pediatric): Secondary | ICD-10-CM | POA: Diagnosis not present

## 2013-02-09 ENCOUNTER — Ambulatory Visit (INDEPENDENT_AMBULATORY_CARE_PROVIDER_SITE_OTHER): Payer: Medicare Other | Admitting: Emergency Medicine

## 2013-02-09 ENCOUNTER — Encounter: Payer: Self-pay | Admitting: Emergency Medicine

## 2013-02-09 VITALS — BP 130/86 | HR 83 | Temp 99.0°F | Resp 16 | Ht 65.5 in | Wt 227.2 lb

## 2013-02-09 DIAGNOSIS — E119 Type 2 diabetes mellitus without complications: Secondary | ICD-10-CM

## 2013-02-09 DIAGNOSIS — R609 Edema, unspecified: Secondary | ICD-10-CM

## 2013-02-09 DIAGNOSIS — R0602 Shortness of breath: Secondary | ICD-10-CM

## 2013-02-09 DIAGNOSIS — J61 Pneumoconiosis due to asbestos and other mineral fibers: Secondary | ICD-10-CM

## 2013-02-09 DIAGNOSIS — E1142 Type 2 diabetes mellitus with diabetic polyneuropathy: Secondary | ICD-10-CM | POA: Diagnosis not present

## 2013-02-09 DIAGNOSIS — I25810 Atherosclerosis of coronary artery bypass graft(s) without angina pectoris: Secondary | ICD-10-CM

## 2013-02-09 DIAGNOSIS — E1140 Type 2 diabetes mellitus with diabetic neuropathy, unspecified: Secondary | ICD-10-CM

## 2013-02-09 DIAGNOSIS — I509 Heart failure, unspecified: Secondary | ICD-10-CM

## 2013-02-09 DIAGNOSIS — E1149 Type 2 diabetes mellitus with other diabetic neurological complication: Secondary | ICD-10-CM | POA: Diagnosis not present

## 2013-02-09 NOTE — Progress Notes (Signed)
  Subjective:    Patient ID: [name], male    DOB: 1946-07-17, 67 y.o.   MRN: [id number]  HPI patient here to discuss referral to a different pulmonary specialist to evaluate him for asbestosis. He is known to have calcific plaques present on the chest x-ray. He has had asbestos exposure. He has been to see the physicians at [facility] and they have told him there is no treatment for his disorder. He would like a second opinion from a pulmonary specialist in [place]. He also is complaining of increasing swelling of his lower extremities. He underwent a Lexus scan in March and this showed a normal ejection fraction. He is status post coronary artery bypass graft and does have a known scar inferior portion of his heart he notices his weight has gone up recently and he has had significant increased swelling in his lower extremities. He was given a prescription to take Lasix off and on on his last cardiology appointment but he has not been taking this.    Review of Systems     Objective:   Physical Exam patient is alert and cooperative in no distress. His chest was clear. His cardiac exam regular rate no murmurs. Extremities show 3+ edema up to the knee. His weight is up 11 pounds from his visit to the cardiologist in February.  Results for orders placed in visit on 02/09/13  GLUCOSE, POCT (MANUAL RESULT ENTRY)      Result Value Range   POC Glucose 100 (*) 70 - 99 mg/dl  POCT GLYCOSYLATED HEMOGLOBIN (HGB A1C)      Result Value Range   Hemoglobin A1C 6.3          Assessment & Plan:  Patient to take his Lasix 20 mg one a day. [facility]  card[facility] was called and they are going to call patient for a follow up appointment in about 2 weeks. We'll also make referral to pulmonary to further evaluate his asbestosis. The Bmet and BNP were done

## 2013-02-10 LAB — BASIC METABOLIC PANEL
BUN: 32 mg/dL — ABNORMAL HIGH (ref 6–23)
CO2: 24 mEq/L (ref 19–32)
Chloride: 99 mEq/L (ref 96–112)
Glucose, Bld: 103 mg/dL — ABNORMAL HIGH (ref 70–99)
Potassium: 3.6 mEq/L (ref 3.5–5.3)

## 2013-02-11 ENCOUNTER — Encounter: Payer: Self-pay | Admitting: Internal Medicine

## 2013-02-11 ENCOUNTER — Ambulatory Visit (INDEPENDENT_AMBULATORY_CARE_PROVIDER_SITE_OTHER): Payer: Medicare Other | Admitting: Internal Medicine

## 2013-02-11 ENCOUNTER — Ambulatory Visit (INDEPENDENT_AMBULATORY_CARE_PROVIDER_SITE_OTHER)
Admission: RE | Admit: 2013-02-11 | Discharge: 2013-02-11 | Disposition: A | Discharge status: Home / Self Care | Payer: Medicare Other | Source: Ambulatory Visit | Attending: Internal Medicine | Admitting: Internal Medicine

## 2013-02-11 VITALS — BP 142/78 | HR 88 | Temp 97.8°F | Ht 67.0 in | Wt 225.0 lb

## 2013-02-11 DIAGNOSIS — N19 Unspecified kidney failure: Secondary | ICD-10-CM | POA: Diagnosis not present

## 2013-02-11 DIAGNOSIS — R0602 Shortness of breath: Secondary | ICD-10-CM | POA: Diagnosis not present

## 2013-02-11 DIAGNOSIS — I10 Essential (primary) hypertension: Secondary | ICD-10-CM | POA: Diagnosis not present

## 2013-02-11 DIAGNOSIS — R0989 Other specified symptoms and signs involving the circulatory and respiratory systems: Secondary | ICD-10-CM

## 2013-02-11 DIAGNOSIS — R0600 Dyspnea, unspecified: Secondary | ICD-10-CM

## 2013-02-11 DIAGNOSIS — R0609 Other forms of dyspnea: Secondary | ICD-10-CM

## 2013-02-11 MED ORDER — NEBIVOLOL HCL 10 MG PO TABS: ORAL_TABLET | ORAL | Status: DC

## 2013-02-11 NOTE — Progress Notes (Signed)
Quick Note:  Spoke with pt and notified of results per Dr. [name]. Pt verbalized understanding and denied any questions.  ______ 

## 2013-02-11 NOTE — Progress Notes (Signed)
  Subjective:    Patient ID: [name], male    DOB: Aug 01, 1946  MRN: [id number]  HPI  64 yowm never smoker with obesity and osa/appliance dep with h/o ? Asthma as child and then needed inhalers with colds only  since 2004 referred by [name] for eval of sob x 2013 after eval by [name] for hiccups in 2014 dx'd with ? asbestosis   02/11/2013 1st pulmonary eval cc indolent onset progressive doe esp out in hot weather x one year with some dry cough on hot air exposure and even indoors has to stop about 150 yards nl pace with stress test by [facility] March 2014 with nl ef, low risk IHD.  Has used inhalers in past, but not in past year  Has noted some assoc with  swelling but no orthopnea.   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  No obvious daytime variabilty or assoc excess mucus prod or cp or chest tightness, subjective wheeze overt sinus or hb symptoms. No unusual exp hx or h/o childhood pna/ asthma or knowledge of premature birth.   Review of Systems  Constitutional: Negative for fever, chills, activity change, appetite change and unexpected weight change.  HENT: Positive for congestion, sneezing, trouble swallowing and dental problem. Negative for sore throat, rhinorrhea, voice change and postnasal drip.   Eyes: Negative for visual disturbance.  Respiratory: Positive for cough and shortness of breath. Negative for choking.   Cardiovascular: Negative for chest pain and leg swelling.  Gastrointestinal: Negative for nausea, vomiting and abdominal pain.  Genitourinary: Negative for difficulty urinating.  Musculoskeletal: Negative for arthralgias.  Skin: Negative for rash.  Psychiatric/Behavioral: Negative for behavioral problems and confusion.       Objective:   Physical Exam  amb obese wm nad   Wt Readings from Last 3 Encounters:  02/11/13 225 lb (102.059 kg)  02/09/13 227 lb 3.2 oz (103.057 kg)  11/22/12 212 lb (96.163 kg)     HEENT: nl dentition,  turbinates, and orophanx. Nl external ear canals without cough reflex   NECK :  without JVD/Nodes/TM/ nl carotid upstrokes bilaterally   LUNGS: no acc muscle use, clear to A and P bilaterally without cough on insp or exp maneuvers   CV:  RRR  no s3 or murmur or increase in P2,  Pos edema 1-2plus knees down sym  bilaterally  ABD:  soft and nontender with nl excursion in the supine position. No bruits or organomegaly, bowel sounds nl  MS:  warm without deformities, calf tenderness, cyanosis or clubbing  SKIN: warm and dry without lesions    NEURO:  alert, approp, no deficits    CXR  02/11/2013 :   No active disease. No significant change     Assessment & Plan:

## 2013-02-11 NOTE — Patient Instructions (Addendum)
GERD (REFLUX)  is an extremely common cause of respiratory symptoms, many times with no significant heartburn at all.    It can be treated with medication, but also with lifestyle changes including avoidance of late meals, excessive alcohol, smoking cessation, and avoid fatty foods, chocolate, peppermint, colas, red wine, and acidic juices such as orange juice.  NO MINT OR MENTHOL PRODUCTS SO NO COUGH DROPS  USE SUGARLESS CANDY INSTEAD (jolley ranchers or Stover's)  NO OIL BASED VITAMINS - use powdered substitutes - no Fish oil  Continue prilosec 2 before bfast and 2 before supper.  Stop losartan and lopressor(metaprolol)  Start bystolic 10 mg twice daily for now  Please remember to go to the  x-ray department downstairs for your tests - we will call you with the results when they are available.  Please schedule a follow up office visit in 1 weeks, sooner if needed to recheck your kidney function and blood pressure

## 2013-02-13 DIAGNOSIS — N19 Unspecified kidney failure: Secondary | ICD-10-CM | POA: Insufficient documentation

## 2013-02-13 NOTE — Assessment & Plan Note (Signed)
-   02/11/2013  Walked RA x 3 laps @ 185 ft each stopped due to end of study no desat    Obesity/deconditioning is the most likely dx her though he could also have copd and indolent PF related to asbestos exposure which was evaluated at [facility] but for which there is no rx x 02 if he desaturates  Needs baseline pft's here and check ct from [facility]

## 2013-02-13 NOTE — Assessment & Plan Note (Signed)
Lab Results  Component Value Date   CREATININE 2.72* 02/09/2013   CREATININE 0.99 09/16/2012   CREATININE 1.12 04/29/2012   CREATININE 1.00 04/03/2012   CREATININE 0.94 04/02/2012     ? If this was related to contrast CT done at [facility]? > assoc with edema on exam and ? Contributing to doe > try off arb x one week then regroup and consider nephrology eval if not better

## 2013-02-13 NOTE — Assessment & Plan Note (Addendum)
Due to creat 2.7 will d/c arb and consolidate rx with bystolic 10 bid and recheck one week

## 2013-02-15 ENCOUNTER — Telehealth: Payer: Self-pay | Admitting: *Deleted

## 2013-02-15 ENCOUNTER — Other Ambulatory Visit (INDEPENDENT_AMBULATORY_CARE_PROVIDER_SITE_OTHER): Payer: Medicare Other

## 2013-02-15 DIAGNOSIS — N19 Unspecified kidney failure: Secondary | ICD-10-CM

## 2013-02-15 LAB — BASIC METABOLIC PANEL
BUN: 18 mg/dL (ref 6–23)
CO2: 31 mEq/L (ref 19–32)
Chloride: 97 mEq/L (ref 96–112)
Creatinine, Ser: 1.2 mg/dL (ref 0.4–1.5)
Glucose, Bld: 122 mg/dL — ABNORMAL HIGH (ref 70–99)
Potassium: 3.2 mEq/L — ABNORMAL LOW (ref 3.5–5.1)

## 2013-02-15 NOTE — Telephone Encounter (Signed)
pt rrtnd my call from earlier today about lab results. pt now aware he will be referred to Nephrology. advised our office will w/appt w/Nephro, pt said ok. coming in today for bmet

## 2013-02-15 NOTE — Telephone Encounter (Signed)
lmptcb to go over lab results and the need to repeat bmet today; see results from [place]. I will try pt again in a little bit, bmet order placed today

## 2013-02-15 NOTE — Telephone Encounter (Signed)
Message copied by Michae Kava on Mon Feb 15, 2013 11:14 AM ------      Message from: [place], [place] T      Created: Fri Feb 12, 2013  5:14 PM       Normal BNP - no heart failure      Agree with referral to nephrology - stat      Edema likely related to acute renal failure      Patient will need repeat BMET Monday 6/23      Report to ED over weekend if feeling poorly (uremic symptoms) or if not urinating      ARB stopped by Dr. [name] today  -  Remain off of this medication      Will fwd this to [name], MD for FYI as well      Richardson Dopp, PA-C        02/12/2013 5:12 PM ------

## 2013-02-16 ENCOUNTER — Telehealth: Payer: Self-pay

## 2013-02-16 ENCOUNTER — Other Ambulatory Visit: Payer: Self-pay | Admitting: *Deleted

## 2013-02-16 ENCOUNTER — Telehealth: Payer: Self-pay | Admitting: Internal Medicine

## 2013-02-16 DIAGNOSIS — N19 Unspecified kidney failure: Secondary | ICD-10-CM

## 2013-02-16 DIAGNOSIS — J920 Pleural plaque with presence of asbestos: Secondary | ICD-10-CM

## 2013-02-16 DIAGNOSIS — I10 Essential (primary) hypertension: Secondary | ICD-10-CM

## 2013-02-16 MED ORDER — POTASSIUM CHLORIDE CRYS ER 20 MEQ PO TBCR: 20.0000 meq | EXTENDED_RELEASE_TABLET | Freq: Every day | ORAL | Status: DC

## 2013-02-16 MED ORDER — FUROSEMIDE 20 MG PO TABS: 20.0000 mg | ORAL_TABLET | Freq: Every day | ORAL | Status: DC

## 2013-02-16 NOTE — Telephone Encounter (Signed)
Received call from Dr.Goldsborough's office.Dr.Goldsborough is going to see patient this week and she wanted patient to have a repeat bmet this week.Patient scheduled bmet to be done this Friday 02/19/13.

## 2013-02-16 NOTE — Telephone Encounter (Signed)
To look at [place], you have to be in an ov note or telephone call  I created this note and so all you have to do is click on care everywhere and click view outside records The ct is under "other results"

## 2013-02-17 DIAGNOSIS — J920 Pleural plaque with presence of asbestos: Secondary | ICD-10-CM | POA: Insufficient documentation

## 2013-02-18 ENCOUNTER — Ambulatory Visit (INDEPENDENT_AMBULATORY_CARE_PROVIDER_SITE_OTHER): Payer: Medicare Other | Admitting: Adult Health

## 2013-02-18 ENCOUNTER — Encounter: Payer: Self-pay | Admitting: Adult Health

## 2013-02-18 VITALS — BP 126/74 | HR 67 | Temp 97.7°F | Ht 67.0 in | Wt 225.2 lb

## 2013-02-18 DIAGNOSIS — R0989 Other specified symptoms and signs involving the circulatory and respiratory systems: Secondary | ICD-10-CM

## 2013-02-18 DIAGNOSIS — I10 Essential (primary) hypertension: Secondary | ICD-10-CM | POA: Diagnosis not present

## 2013-02-18 DIAGNOSIS — R0609 Other forms of dyspnea: Secondary | ICD-10-CM | POA: Diagnosis not present

## 2013-02-18 DIAGNOSIS — R0600 Dyspnea, unspecified: Secondary | ICD-10-CM

## 2013-02-18 NOTE — Patient Instructions (Addendum)
Continue on current regimen  Follow up Dr. [name]  In 3-4 weeks with PFT  Please contact office for sooner follow up if symptoms do not improve or worsen or seek emergency care

## 2013-02-19 ENCOUNTER — Other Ambulatory Visit (INDEPENDENT_AMBULATORY_CARE_PROVIDER_SITE_OTHER): Payer: Medicare Other

## 2013-02-19 DIAGNOSIS — I10 Essential (primary) hypertension: Secondary | ICD-10-CM | POA: Diagnosis not present

## 2013-02-19 LAB — BASIC METABOLIC PANEL
BUN: 15 mg/dL (ref 6–23)
CO2: 27 mEq/L (ref 19–32)
Calcium: 8.8 mg/dL (ref 8.4–10.5)
Creatinine, Ser: 1.2 mg/dL (ref 0.4–1.5)
Glucose, Bld: 130 mg/dL — ABNORMAL HIGH (ref 70–99)
Sodium: 135 mEq/L (ref 135–145)

## 2013-02-19 NOTE — Assessment & Plan Note (Addendum)
?   Etiology  In never smoker. ? Previous asbestosis exposure w/ lung scarring contributing , will need PFT ,  CT chest [facility] 10/2012 Bilateral pleural calcifications along the pleura anteriorly and posteriorly with pleural thickening No desats with ambulation Cont on current regimen   Plan  Continue on current regimen  Follow up Dr. [name]  In 3-4 weeks with PFT  Please contact office for sooner follow up if symptoms do not improve or worsen or seek emergency care

## 2013-02-19 NOTE — Progress Notes (Signed)
  Subjective:    Patient ID: [name], male    DOB: 02/06/1946  MRN: [id number]  HPI  48 yowm never smoker with obesity and osa/appliance dep with h/o ? Asthma as child and then needed inhalers with colds only  since 2004 referred by [name] for eval of sob x 2013 after eval by [name] for hiccups in 2014 dx'd with ? asbestosis   02/11/2013 1st pulmonary eval cc indolent onset progressive doe esp out in hot weather x one year with some dry cough on hot air exposure and even indoors has to stop about 150 yards nl pace with stress test by [facility] March 2014 with nl ef, low risk IHD.  Has used inhalers in past, but not in past year  Has noted some assoc with  swelling but no orthopnea.   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  >>stop metoprolol and ARB , rx bystolic   0/78/6754 Follow up  Returns for follow up 1 week follow up  Seen last week for new pulmonary consult for progressive dyspnea. Felt meds could be contributing to symptoms.  [name] were stopped . Rx bystolic  Tolerating meds well. However labs showed renal insufficiency  Worsened with scr at 2.72.  He was seen by his cardiolgist and repeat labs on 02/15/13 showed scr down at 1.2  B/p is controlled on present regimen.  Wt is down 2 lbs.  cXR last ov w/ no acute process noted.  Has not had PFT yet.     Review of Systems  Constitutional: Negative for fever, chills, activity change, appetite change and unexpected weight change.  HENT: Positive for congestion, sneezing,  . Negative for sore throat, rhinorrhea, voice change and postnasal drip.   Eyes: Negative for visual disturbance.  Respiratory: Positive for cough and shortness of breath. Negative for choking.   Cardiovascular: Negative for chest pain and leg swelling.  Gastrointestinal: Negative for nausea, vomiting and abdominal pain.  Genitourinary: Negative for difficulty urinating.  Musculoskeletal: Negative for arthralgias.  Skin:  Negative for rash.  Psychiatric/Behavioral: Negative for behavioral problems and confusion.       Objective:   Physical Exam  amb obese wm nad   HEENT: nl dentition, turbinates, and orophanx. Nl external ear canals without cough reflex   NECK :  without JVD/Nodes/TM/ nl carotid upstrokes bilaterally   LUNGS: no acc muscle use, clear to A and P bilaterally without cough on insp or exp maneuvers   CV:  RRR  no s3 or murmur or increase in P2,  Pos edema 1-plus knees down sym  bilaterally  ABD:  soft and nontender with nl excursion in the supine position. No bruits or organomegaly, bowel sounds nl  MS:  warm without deformities, calf tenderness, cyanosis or clubbing  SKIN: warm and dry without lesions    NEURO:  alert, approp, no deficits    CXR  02/11/2013 :   No active disease. No significant change     Assessment & Plan:

## 2013-02-19 NOTE — Assessment & Plan Note (Signed)
Controlled on rx  Cont follow up with cardiology regarding upcoming lab test

## 2013-02-22 ENCOUNTER — Telehealth: Payer: Self-pay | Admitting: *Deleted

## 2013-02-22 ENCOUNTER — Encounter: Payer: Self-pay | Admitting: Physician Assistant

## 2013-02-22 ENCOUNTER — Ambulatory Visit (INDEPENDENT_AMBULATORY_CARE_PROVIDER_SITE_OTHER): Payer: Medicare Other | Admitting: Physician Assistant

## 2013-02-22 VITALS — BP 116/71 | HR 74 | Ht 67.0 in | Wt 220.0 lb

## 2013-02-22 DIAGNOSIS — N179 Acute kidney failure, unspecified: Secondary | ICD-10-CM | POA: Diagnosis not present

## 2013-02-22 DIAGNOSIS — I2510 Atherosclerotic heart disease of native coronary artery without angina pectoris: Secondary | ICD-10-CM

## 2013-02-22 DIAGNOSIS — R0602 Shortness of breath: Secondary | ICD-10-CM

## 2013-02-22 DIAGNOSIS — I10 Essential (primary) hypertension: Secondary | ICD-10-CM | POA: Diagnosis not present

## 2013-02-22 DIAGNOSIS — R609 Edema, unspecified: Secondary | ICD-10-CM | POA: Diagnosis not present

## 2013-02-22 DIAGNOSIS — E7800 Pure hypercholesterolemia, unspecified: Secondary | ICD-10-CM

## 2013-02-22 LAB — BASIC METABOLIC PANEL
BUN: 19 mg/dL (ref 6–23)
Calcium: 9 mg/dL (ref 8.4–10.5)
Chloride: 101 mEq/L (ref 96–112)
Creatinine, Ser: 1.1 mg/dL (ref 0.4–1.5)

## 2013-02-22 MED ORDER — NEBIVOLOL HCL 10 MG PO TABS: ORAL_TABLET | ORAL | Status: DC

## 2013-02-22 NOTE — Telephone Encounter (Signed)
Message copied by Michae Kava on Mon Feb 22, 2013  6:00 PM ------      Message from: [place], [place] T      Created: Mon Feb 22, 2013  5:09 PM       K+ and creatinine ok.      Continue with current treatment plan.      Richardson Dopp, PA-C        02/22/2013 5:08 PM ------

## 2013-02-22 NOTE — Telephone Encounter (Signed)
pt notified about lab results with verbal understanding  

## 2013-02-22 NOTE — Progress Notes (Signed)
[address] [place], [place]  [address] Phone: [phone], Fax:  [phone]  Date:  02/22/2013   ID:  [name], [name] 10/01/1945, MRN [id number]  PCP:  [name], MD And Dr. Marcelle Overlie at the [facility] Cardiologist:  Dr. Peter Martinique     History of Present Illness: [name] is a 67 y.o. male who returns for followup.  He has a hx of CAD, s/p CABG in 2005, HTN, HL, seizure d/o, AFlutter, diverticulosis s/p partial colectomy.  Echo 2/08: EF 55-60%, mild increased AV thickness, echo free space adjacent to apex c/w small eff vs fat pad.  Nuclear study 09/2008: normal.  He was admitted for syncope in 8/13 thought to be related to seizure activity and he was started on Keppra at that time.   I saw him in 2/14.  He complained of chest pain. Lexiscan Myoview 10/26/12: EF 62%, inferior defect (diaphragmatic attenuation versus artifact versus small area of scar), no ischemia; low risk.  He had called in with complaints of LAD edema and was given when necessary Lasix. He then saw Dr. [name] 02/09/13 for referral to pulmonology because of  A history of asbestosis. He noted significant LE edema. Patient was placed on daily Lasix. Lab work identified worsened creatinine (0.99=> 2.72). Patient's ARB had been discontinued. He was referred to nephrology. Follow up labs indicated improved creatinine.  Since starting on Lasix daily, the patient notes that his breathing is improved. He describes NYHA class IIb-3 symptoms prior to starting the Lasix. He currently denies orthopnea, PND. His LE edema is much improved. He denies chest pain or syncope.  Labs (9/13):  K 3.6, creatinine 1.12, ALT 20 Labs (1/14):  K 4.1, creatinine 0.99, Hgb 16 Labs (2/14):  K 3.8, creatinine 1.2, ALT 44, A1c 7.1, TC 199, TG 431, HDL 39, LDL 105 Labs (6/14):  K 3.6=>3.2=>3.9, Cr 2.72=>1.2=>1.2  Wt Readings from Last 3 Encounters:  02/22/13 220 lb (99.791 kg)  02/18/13 225 lb 3.2 oz (102.15  kg)  02/11/13 225 lb (102.059 kg)     Past Medical History  Diagnosis Date  . Coronary artery disease     a. Low level exercise Lex MV 3/14: low risk, EF 62%, inf defect 2/2 diaph attenuation vs artifiact, small area of scar possible, no ischemia  . Hyperlipidemia   . Hypertension   . History of seizure disorder   . History of atrial flutter   . Depression   . Back pain     persistent  . Hearing loss   . Diverticulitis   . H/O alcohol abuse   . Diabetes mellitus     controled by diet  . GERD (gastroesophageal reflux disease)   . Seizures   . Headache(784.0)   . Arthritis     of spine  . Sleep apnea     does not use CPAP    Past Surgical History  Procedure Laterality Date  . Partial colectomy      for diverticuli  . Orchiectomy    . Umbilical hernia repair    . Cervical fusion    . Coronary artery bypass graft  2005    LIMA graft to LAD,saphenous vein graft to diag.,circumflex, marginal,and to the RCA  . Cardiac catheterization    . Appendectomy    . Esophagogastroduodenoscopy  02/10/2012    Procedure: ESOPHAGOGASTRODUODENOSCOPY (EGD);  Surgeon: Cleotis Nipper, MD;  Location: [facility] ENDOSCOPY;  Service: Endoscopy;  Laterality: N/A;  .  Foreign body removal  02/10/2012    Procedure: FOREIGN BODY REMOVAL;  Surgeon: Cleotis Nipper, MD;  Location: [facility] ENDOSCOPY;  Service: Endoscopy;  Laterality: N/A;  . Back surgery    . Esophagogastroduodenoscopy  06/29/2012    Procedure: ESOPHAGOGASTRODUODENOSCOPY (EGD);  Surgeon: Winfield Cunas., MD;  Location: [facility] ENDOSCOPY;  Service: Endoscopy;  Laterality: N/A;  . Foreign body removal  06/29/2012    Procedure: FOREIGN BODY REMOVAL;  Surgeon: Winfield Cunas., MD;  Location: [facility] ENDOSCOPY;  Service: Endoscopy;  Laterality: N/A;  . Savory dilation  06/29/2012    Procedure: SAVORY DILATION;  Surgeon: Winfield Cunas., MD;  Location: [facility] ENDOSCOPY;  Service: Endoscopy;  Laterality: N/A;    Current Outpatient Prescriptions    Medication Sig Dispense Refill  . aspirin 325 MG tablet Take 325 mg by mouth daily.      Marland Kitchen atorvastatin (LIPITOR) 80 MG tablet Take 1 tablet (80 mg total) by mouth at bedtime.  30 tablet  3  . fluticasone (FLONASE) 50 MCG/ACT nasal spray Place 2 sprays into the nose daily.      . furosemide (LASIX) 20 MG tablet Take 1 tablet (20 mg total) by mouth daily.  30 tablet  2  . levETIRAcetam (KEPPRA) 750 MG tablet Take 750 mg by mouth 2 (two) times daily.      . metroNIDAZOLE (FLAGYL) 500 MG tablet Take 500 mg by mouth every 7 (seven) days. SATURDAY      . Multiple Vitamins-Minerals (MULTIVITAMIN WITH MINERALS) tablet Take 1 tablet by mouth daily.      . nebivolol (BYSTOLIC) 10 MG tablet One twice daily      . nitroGLYCERIN (NITROSTAT) 0.4 MG SL tablet Place 1 tablet (0.4 mg total) under the tongue every 5 (five) minutes as needed. For chest pain  25 tablet  6  . omeprazole (PRILOSEC) 20 MG capsule Take 40 mg by mouth 2 (two) times daily.       Marland Kitchen testosterone cypionate (DEPOTESTOTERONE CYPIONATE) 200 MG/ML injection Inject into the muscle every 14 (fourteen) days.      . potassium chloride SA (K-DUR,KLOR-CON) 20 MEQ tablet Take 1 tablet (20 mEq total) by mouth daily.  3 tablet  0   No current facility-administered medications for this visit.    Allergies:    Allergies  Allergen Reactions  . Cephalexin     Unknown  . Doxycycline     Unknown  . Pentazocine Lactate     Unknown    Social History:  The patient  reports that he has never smoked. He has never used smokeless tobacco. He reports that he does not drink alcohol or use illicit drugs.   ROS:  Please see the history of present illness.   He has an occasional cough.  All other systems reviewed and negative.   PHYSICAL EXAM: VS:  BP 116/71  Pulse 74  Ht 5' 7"  (1.702 m)  Wt 220 lb (99.791 kg)  BMI 34.45 kg/m2 Well nourished, well developed, in no acute distress HEENT: normal Neck: no JVD Cardiac:  normal S1, S2; RRR; no  murmur Lungs:  clear to auscultation bilaterally, no wheezing, rhonchi or rales Abd: soft, nontender, no hepatomegaly Ext: trace bilateral LE edema Skin: warm and dry Neuro:  CNs 2-12 intact, no focal abnormalities noted  EKG:  NSR, HR 74, normal axis, no significant change when compared to prior tracing   ASSESSMENT AND PLAN:  1. Coronary Artery Disease:  No angina.  Continue aspirin  and statin.  2. Hypertension: Controlled. 3. Hyperlipidemia: Followup with PCP at the [place]. 4. AKI:  Creatinine now back to normal.  Not certain of the etiology of this.  Creatinine improved with stopping ARB.  Will check renal artery dopplers to r/o RAS. He has an appt pending with nephrology.  5. Edema:  Improved.  ? If he had an element of diastolic CHF although his BNP was normal.  With possible asbestosis, will check Echo to assess R heart pressures.  6. Asbestosis:  F/u with pulmonology as planned.  7. Disposition: Followup with Dr. Martinique in 8 weeks.  Danton Sewer, PA-C  11:04 AM 02/22/2013

## 2013-02-22 NOTE — Patient Instructions (Addendum)
Your physician has requested that you have a renal artery duplex, DX ACUTE KIDNEY INJURY, HTN.. R/O  RENAL ARTERIAL STENOSIS. During this test, an ultrasound is used to evaluate blood flow to the kidneys. Allow one hour for this exam. Do not eat after midnight the day before and avoid carbonated beverages. Take your medications as you usually do.  LAB TODAY; BMET  PLEASE SCHEDULE TO HAVE ECHO  PLEASE FOLLOW UP WITH DR. Martinique IN 6-8 WEEKS  A REFILL FOR BYSTOLIC WAS SENT IN TODAY

## 2013-02-24 ENCOUNTER — Telehealth: Payer: Self-pay | Admitting: Internal Medicine

## 2013-02-24 DIAGNOSIS — N179 Acute kidney failure, unspecified: Secondary | ICD-10-CM | POA: Diagnosis not present

## 2013-02-24 NOTE — Telephone Encounter (Signed)
Pt calling to let Dr. [name] know he saw Dr. [name] over at [facility]. I advised will forward to MW as an FYI. This can be pulled in care everywhere. Please advise thanks

## 2013-03-01 ENCOUNTER — Ambulatory Visit (HOSPITAL_COMMUNITY): Payer: Medicare Other | Attending: Cardiology

## 2013-03-01 ENCOUNTER — Encounter (INDEPENDENT_AMBULATORY_CARE_PROVIDER_SITE_OTHER): Payer: Medicare Other

## 2013-03-01 ENCOUNTER — Encounter: Payer: Self-pay | Admitting: Physician Assistant

## 2013-03-01 DIAGNOSIS — E785 Hyperlipidemia, unspecified: Secondary | ICD-10-CM | POA: Insufficient documentation

## 2013-03-01 DIAGNOSIS — I10 Essential (primary) hypertension: Secondary | ICD-10-CM | POA: Insufficient documentation

## 2013-03-01 DIAGNOSIS — R0989 Other specified symptoms and signs involving the circulatory and respiratory systems: Secondary | ICD-10-CM | POA: Insufficient documentation

## 2013-03-01 DIAGNOSIS — N179 Acute kidney failure, unspecified: Secondary | ICD-10-CM

## 2013-03-01 DIAGNOSIS — R0609 Other forms of dyspnea: Secondary | ICD-10-CM | POA: Diagnosis not present

## 2013-03-01 DIAGNOSIS — E119 Type 2 diabetes mellitus without complications: Secondary | ICD-10-CM | POA: Diagnosis not present

## 2013-03-01 DIAGNOSIS — I2510 Atherosclerotic heart disease of native coronary artery without angina pectoris: Secondary | ICD-10-CM | POA: Insufficient documentation

## 2013-03-01 DIAGNOSIS — R0602 Shortness of breath: Secondary | ICD-10-CM

## 2013-03-01 DIAGNOSIS — I4892 Unspecified atrial flutter: Secondary | ICD-10-CM | POA: Insufficient documentation

## 2013-03-01 DIAGNOSIS — N19 Unspecified kidney failure: Secondary | ICD-10-CM | POA: Diagnosis not present

## 2013-03-01 NOTE — Progress Notes (Signed)
Echocardiogram performed.  

## 2013-03-02 ENCOUNTER — Encounter: Payer: Self-pay | Admitting: Physician Assistant

## 2013-03-02 ENCOUNTER — Telehealth: Payer: Self-pay | Admitting: *Deleted

## 2013-03-02 NOTE — Telephone Encounter (Signed)
pt notified about both echo and renal duplex results with verbal understanding today.

## 2013-03-02 NOTE — Telephone Encounter (Signed)
Can I close this note now? Please advise, thanks!

## 2013-03-02 NOTE — Telephone Encounter (Signed)
Message copied by Michae Kava on Tue Mar 02, 2013  2:34 PM ------      Message from: [place], [place] T      Created: Mon Mar 01, 2013  4:46 PM       Normal LVF      Very mildly elevated pulmonary pressures      No further intervention required      Continue with current treatment plan.      Richardson Dopp, PA-C        03/01/2013 4:45 PM ------

## 2013-03-02 NOTE — Telephone Encounter (Signed)
Found the info, nothing else needed

## 2013-03-19 ENCOUNTER — Other Ambulatory Visit: Payer: Self-pay | Admitting: Physician Assistant

## 2013-03-24 ENCOUNTER — Encounter: Payer: Self-pay | Admitting: Internal Medicine

## 2013-03-24 ENCOUNTER — Ambulatory Visit: Payer: Medicare Other | Admitting: Internal Medicine

## 2013-03-24 ENCOUNTER — Ambulatory Visit (INDEPENDENT_AMBULATORY_CARE_PROVIDER_SITE_OTHER): Payer: Medicare Other | Admitting: Internal Medicine

## 2013-03-24 VITALS — BP 160/78 | HR 85 | Temp 98.5°F | Ht 66.0 in | Wt 222.0 lb

## 2013-03-24 DIAGNOSIS — I10 Essential (primary) hypertension: Secondary | ICD-10-CM | POA: Diagnosis not present

## 2013-03-24 DIAGNOSIS — R091 Pleurisy: Secondary | ICD-10-CM | POA: Diagnosis not present

## 2013-03-24 DIAGNOSIS — J920 Pleural plaque with presence of asbestos: Secondary | ICD-10-CM

## 2013-03-24 DIAGNOSIS — R0609 Other forms of dyspnea: Secondary | ICD-10-CM

## 2013-03-24 DIAGNOSIS — T65891A Toxic effect of other specified substances, accidental (unintentional), initial encounter: Secondary | ICD-10-CM | POA: Diagnosis not present

## 2013-03-24 DIAGNOSIS — R0600 Dyspnea, unspecified: Secondary | ICD-10-CM

## 2013-03-24 MED ORDER — NEBIVOLOL HCL 20 MG PO TABS: ORAL_TABLET | ORAL | Status: DC

## 2013-03-24 NOTE — Patient Instructions (Addendum)
Change bystolic to 20 mg twice daily until see you Dr Martinique   Your lungs are not your limiting problem and once you finish with the doctors you are see, return to see See  with all your medications, even over the counter meds, separated in two separate bags, the ones you take no matter what vs the ones you stop once you feel better and take only as needed when you feel you need them.   Tammy  will generate for you a new user friendly medication calendar that will put [place] all on the same page re: your medication use.     Without this process, it simply isn't possible to assure that we are providing  your outpatient care  with  the attention to detail we feel you deserve.   If we cannot assure that you're getting that kind of care,  then we cannot manage your problem effectively from this clinic.  Once you have seen Tammy and we are sure that we're all on the same page with your medication use she will arrange follow up with me with repeat PFT's if not doing better.

## 2013-03-24 NOTE — Progress Notes (Signed)
Subjective:    Patient ID: [name], male    DOB: August 28, 1945  MRN: [id number]    Brief patient profile:  62 yowm never smoker with obesity and osa/appliance dep with h/o ? Asthma as child and then needed inhalers with colds only  since 2004 referred by [name] for eval of sob x 2013 after eval by [name] for hiccups in 2014 dx'd with ? asbestosis   02/11/2013 1st pulmonary eval cc indolent onset progressive doe esp out in hot weather x one year with some dry cough on hot air exposure and even indoors has to stop about 150 yards nl pace with stress test by [facility] March 2014 with nl ef, low risk IHD.  Has used inhalers in past, but not in past year  Has noted some assoc with  swelling but no orthopnea.   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  >>stop metoprolol and ARB , rx bystolic   0/26/3785 Follow up  Returns for follow up 1 week follow up  Seen last week for new pulmonary consult for progressive dyspnea. Felt meds could be contributing to symptoms.  [name] were stopped . Rx bystolic  Tolerating meds well. However labs showed renal insufficiency  Worsened with scr at 2.72.  He was seen by his cardiolgist and repeat labs on 02/15/13 showed scr down at 1.2  B/p is controlled on present regimen.  Wt is down 2 lbs.  cXR last ov w/ no acute process noted.   rec No change rx   03/24/2013 f/u ov/Hitesh Fouche re sob Chief Complaint  Patient presents with  . Follow-up    Pt states that his breathing is unchanged since the last visit. He c/o increased non prod cough x 1 wk- seems worse with humid weather and in the late evening.   [facility], leg swelling resolved and kidney fx better but breathing no better in hot weather, does fine walking flat in cool climate like a mall.  No obvious daytime variabilty or assoc  cp or chest tightness, subjective wheeze overt sinus or hb symptoms. No unusual exp hx or h/o childhood pna/ asthma or  knowledge of premature birth.   Sleeping ok without nocturnal  or early am exacerbation  of respiratory  c/o's or need for noct saba. Also denies any obvious fluctuation of symptoms with weather or environmental changes or other aggravating or alleviating factors except as outlined above   Current Medications, Allergies, Past Medical History, Past Surgical History, Family History, and Social History were reviewed in [employer] record.  ROS  The following are not active complaints unless bolded sore throat, dysphagia, dental problems, itching, sneezing,  nasal congestion or excess/ purulent secretions, ear ache,   fever, chills, sweats, unintended wt loss, pleuritic or exertional cp, hemoptysis,  orthopnea pnd or leg swelling, presyncope, palpitations, heartburn, abdominal pain, anorexia, nausea, vomiting, diarrhea  or change in bowel or urinary habits, change in stools or urine, dysuria,hematuria,  rash, arthralgias, visual complaints, headache, numbness weakness or ataxia or problems with walking or coordination,  change in mood/affect or memory.                Objective:   Physical Exam  amb obese wm nad  Wt Readings from Last 3 Encounters:  03/24/13 222 lb (100.699 kg)  02/22/13 220 lb (99.791 kg)  02/18/13 225 lb 3.2 oz (102.15 kg)      HEENT: nl dentition, turbinates,  and orophanx. Nl external ear canals without cough reflex   NECK :  without JVD/Nodes/TM/ nl carotid upstrokes bilaterally   LUNGS: no acc muscle use, clear to A and P bilaterally without cough on insp or exp maneuvers   CV:  RRR  no s3 or murmur or increase in P2,  Pos edema 1-plus knees down sym  bilaterally  ABD:  soft and nontender with nl excursion in the supine position. No bruits or organomegaly, bowel sounds nl  MS:  warm without deformities, calf tenderness, cyanosis or clubbing  SKIN: warm and dry without lesions        CXR  02/11/2013 :   No active disease. No  significant change     Assessment & Plan:

## 2013-03-25 NOTE — Assessment & Plan Note (Signed)
Ct [facility] 11/04/2012 1. Normal heart size with changes of CABG. 2. Small hiatal hernia. 3. Bilateral pleural calcifications along the pleura anteriorly and posteriorly with pleural thickening in these locations. This is possibly related to remote inflammatory/traumatic etiology with asbestosis not excluded   C//w asbestos exp but not asbestosis per se or causing him significant sob

## 2013-03-25 NOTE — Assessment & Plan Note (Signed)
Not Adequate control on present rx, reviewed options > has plans to f/u with Martinique so will give enough bystolic for now to increase to 20 bid

## 2013-04-08 ENCOUNTER — Telehealth: Payer: Self-pay | Admitting: Internal Medicine

## 2013-04-08 MED ORDER — NEBIVOLOL HCL 20 MG PO TABS: ORAL_TABLET | ORAL | Status: DC

## 2013-04-08 NOTE — Telephone Encounter (Signed)
Patient Instructions    Change bystolic to 20 mg twice daily until see you Dr Martinique   ---  I spoke with pt. He stated will not see him until 04/28/13 and will need new RX to get him until then. I have sent this in. Nothing further needed

## 2013-04-13 DIAGNOSIS — Z0271 Encounter for disability determination: Secondary | ICD-10-CM

## 2013-04-17 ENCOUNTER — Ambulatory Visit (INDEPENDENT_AMBULATORY_CARE_PROVIDER_SITE_OTHER): Payer: Medicare Other | Admitting: Internal Medicine

## 2013-04-17 VITALS — BP 136/74 | HR 63 | Temp 97.8°F | Resp 18 | Wt 220.0 lb

## 2013-04-17 DIAGNOSIS — H6120 Impacted cerumen, unspecified ear: Secondary | ICD-10-CM | POA: Diagnosis not present

## 2013-04-17 DIAGNOSIS — R05 Cough: Secondary | ICD-10-CM | POA: Diagnosis not present

## 2013-04-17 DIAGNOSIS — L910 Hypertrophic scar: Secondary | ICD-10-CM

## 2013-04-17 DIAGNOSIS — H6123 Impacted cerumen, bilateral: Secondary | ICD-10-CM

## 2013-04-17 DIAGNOSIS — J309 Allergic rhinitis, unspecified: Secondary | ICD-10-CM

## 2013-04-17 MED ORDER — AZELASTINE HCL 0.1 % NA SOLN: 1.0000 | Freq: Two times a day (BID) | NASAL | Status: DC

## 2013-04-17 MED ORDER — MOMETASONE FUROATE 50 MCG/ACT NA SUSP: 2.0000 | Freq: Every day | NASAL | Status: DC

## 2013-04-17 NOTE — Patient Instructions (Addendum)
Try Robitussin for cough. Mederma cream for chest scars  Allergic Rhinitis Allergic rhinitis is when the mucous membranes in the nose respond to allergens. Allergens are particles in the air that cause your body to have an allergic reaction. This causes you to release allergic antibodies. Through a chain of events, these eventually cause you to release histamine into the blood stream (hence the use of antihistamines). Although meant to be protective to the body, it is this release that causes your discomfort, such as frequent sneezing, congestion and an itchy runny nose.  CAUSES  The pollen allergens may come from grasses, trees, and weeds. This is seasonal allergic rhinitis, or "hay fever." Other allergens cause year-round allergic rhinitis (perennial allergic rhinitis) such as house dust mite allergen, pet dander and mold spores.  SYMPTOMS   Nasal stuffiness (congestion).  Runny, itchy nose with sneezing and tearing of the eyes.  There is often an itching of the mouth, eyes and ears. It cannot be cured, but it can be controlled with medications. DIAGNOSIS  If you are unable to determine the offending allergen, skin or blood testing may find it. TREATMENT   Avoid the allergen.  Medications and allergy shots (immunotherapy) can help.  Hay fever may often be treated with antihistamines in pill or nasal spray forms. Antihistamines block the effects of histamine. There are over-the-counter medicines that may help with nasal congestion and swelling around the eyes. Check with your caregiver before taking or giving this medicine. If the treatment above does not work, there are many new medications your caregiver can prescribe. Stronger medications may be used if initial measures are ineffective. Desensitizing injections can be used if medications and avoidance fails. Desensitization is when a patient is given ongoing shots until the body becomes less sensitive to the allergen. Make sure you follow  up with your caregiver if problems continue. SEEK MEDICAL CARE IF:   You develop fever (more than 100.5 F (38.1 C).  You develop a cough that does not stop easily (persistent).  You have shortness of breath.  You start wheezing.  Symptoms interfere with normal daily activities. Document Released: 05/07/2001 Document Revised: 11/04/2011 Document Reviewed: 11/16/2008 [facility] Patient Information 2014 [place].

## 2013-04-17 NOTE — Progress Notes (Signed)
  Subjective:    Patient ID: [name], male    DOB: August 18, 1946, 67 y.o.   MRN: [id number]  HPI Ears stopped up, wearing hearing aids. Has had nasal congestion for a long time. Tried flonase in the past with little relief. Several years with dry cough.    Review of Systems  Constitutional: Negative for fever.  HENT: Positive for congestion, sneezing and postnasal drip. Negative for sore throat and sinus pressure.   Eyes: Negative for pain, discharge and itching.  Respiratory: Positive for cough and shortness of breath.        Seeing Dr. Shyrl Numbers with 6 month history of SOB, has had about 50% improvement and has been able to resume some activities.  Cardiovascular: Negative for chest pain and leg swelling.  Allergic/Immunologic: Positive for environmental allergies.  Has not had a seizure for several years.     Objective:   Physical Exam  Constitutional: He is oriented to person, place, and time. He appears well-developed and well-nourished.  HENT:  Right Ear: Tympanic membrane normal. No decreased hearing is noted.  Left Ear: Tympanic membrane normal. Decreased hearing is noted.  Mouth/Throat: Oropharynx is clear and moist.  Bilateral ear canals with large amount wax.  Eyes: Conjunctivae are normal.  Neck: Normal range of motion. Neck supple.  Cardiovascular: Normal rate, regular rhythm and normal heart sounds.   Pulmonary/Chest: Effort normal and breath sounds normal.  Prominent sternal keloids. Sternal wire palpable. Ridges with caked, dry skin.  Musculoskeletal: He exhibits no edema.  Lymphadenopathy:    He has no cervical adenopathy.  Neurological: He is alert and oriented to person, place, and time.  Skin: Skin is warm and dry.      Assessment & Plan:  Cough  Cerumen impaction, bilateral  Allergic rhinitis  Patient doing much better from pulmonary stand point and is happy with his decreased shortness of breath and ability to tolerate increased activity.    Patient's ears irrigated bilaterally with good clearing and restoration of hearing in left ear.  Discussed allergic rhinitis and dry cough symptoms, will try Nasonex and Astelin. Can use Robitussin PRN. Explained that improvement can take 1-2 weeks. Gave information sheet on Allergic Rhinitis.  Removed several large areas of dry skin from sternal keloids. Discussed using hydrogen peroxide prior to showering on sternal keloids and cleaning with cotton swab. Can use OTC mederma to try to soften keloids.  Meds ordered this encounter  Medications  . azelastine (ASTELIN) 137 MCG/SPRAY nasal spray    Sig: Place 1 spray into the nose 2 (two) times daily. Use in each nostril as directed    Dispense:  30 mL    Refill:  12  . mometasone (NASONEX) 50 MCG/ACT nasal spray    Sig: Place 2 sprays into the nose daily.    Dispense:  17 g    Refill:  12    I have reviewed and agree with documentation. I fully participated in the evaluation and treatment plan Robert P. Laney Pastor, M.D.

## 2013-04-20 ENCOUNTER — Other Ambulatory Visit: Payer: Self-pay | Admitting: Physician Assistant

## 2013-04-28 ENCOUNTER — Encounter: Payer: Self-pay | Admitting: Cardiology

## 2013-04-28 ENCOUNTER — Ambulatory Visit (INDEPENDENT_AMBULATORY_CARE_PROVIDER_SITE_OTHER): Payer: Medicare Other | Admitting: Cardiology

## 2013-04-28 VITALS — BP 120/86 | HR 71 | Ht 66.0 in | Wt 222.8 lb

## 2013-04-28 DIAGNOSIS — I10 Essential (primary) hypertension: Secondary | ICD-10-CM | POA: Diagnosis not present

## 2013-04-28 DIAGNOSIS — N19 Unspecified kidney failure: Secondary | ICD-10-CM

## 2013-04-28 DIAGNOSIS — R0600 Dyspnea, unspecified: Secondary | ICD-10-CM

## 2013-04-28 DIAGNOSIS — R0609 Other forms of dyspnea: Secondary | ICD-10-CM

## 2013-04-28 DIAGNOSIS — I2510 Atherosclerotic heart disease of native coronary artery without angina pectoris: Secondary | ICD-10-CM | POA: Diagnosis not present

## 2013-04-28 NOTE — Patient Instructions (Signed)
Continue your current therapy  Do not take any nonsteroidal anti-inflammatory medications like Celebrex, Advil, Ibuprofen, Aleve, etc.  You need to lose weight  I will see you in 6 months.

## 2013-04-28 NOTE — Progress Notes (Signed)
[name] Date of Birth: 09-21-1945 Medical Record #[id number]  History of Present Illness: Peak is seen for a followup visit.  He has a history of coronary disease and is status post CABG in 2005. He has a history of hypertension, hyperlipidemia, and previous history of atrial flutter. Earlier this year he was evaluated for symptoms of increasing dyspnea. He had a stress Myoview study on 10/26/2012. This showed a small fixed inferior wall defect consistent with diaphragmatic attenuation versus a small area of scar. Ejection fraction was normal at 60%. There was no ischemia. He also had an echocardiogram. This demonstrated moderate LVH with normal systolic function. There was mild mitral insufficiency. Pulmonary pressure was estimated at 36 mmHg. He was experiencing some edema but this improved with Lasix 20 mg daily. Early in the year he had an episode where he passed out. There was some concern that this was a seizure. He thinks it was related to taking too many sleeping pills. When also seen earlier this year he had acute renal failure with creatinine up to 2.7. His ARB was stopped and his renal function returned to normal. Renal arterial Doppler study was normal. He continues to have shortness of breath. He has been evaluated by pulmonology. He has gained over 29 pounds in the past year and a half. He complains of severe arthritis and wants to try taking Celebrex. He has also had surgery in the past year for his back.  Current Outpatient Prescriptions on File Prior to Visit  Medication Sig Dispense Refill  . aspirin 325 MG tablet Take 325 mg by mouth daily.      Marland Kitchen atorvastatin (LIPITOR) 80 MG tablet TAKE 1 TABLET BY MOUTH EVERY NIGHT AT BEDTIME  30 tablet  3  . fluticasone (FLONASE) 50 MCG/ACT nasal spray Place 2 sprays into the nose daily.      . furosemide (LASIX) 20 MG tablet Take 1 tablet (20 mg total) by mouth daily.  30 tablet  2  . levETIRAcetam (KEPPRA) 750 MG tablet Take 750 mg by  mouth 2 (two) times daily.      . metroNIDAZOLE (FLAGYL) 500 MG tablet Take 500 mg by mouth every 7 (seven) days. SATURDAY      . Multiple Vitamins-Minerals (MULTIVITAMIN WITH MINERALS) tablet Take 1 tablet by mouth daily.      . Nebivolol HCl (BYSTOLIC) 20 MG TABS Take 2 tabs daily  60 tablet  0  . nitroGLYCERIN (NITROSTAT) 0.4 MG SL tablet Place 1 tablet (0.4 mg total) under the tongue every 5 (five) minutes as needed. For chest pain  25 tablet  6  . omeprazole (PRILOSEC) 20 MG capsule Take 40 mg by mouth 2 (two) times daily.       Marland Kitchen testosterone cypionate (DEPOTESTOTERONE CYPIONATE) 200 MG/ML injection Inject into the muscle every 14 (fourteen) days.       No current facility-administered medications on file prior to visit.    Allergies  Allergen Reactions  . Cephalexin     Unknown  . Doxycycline     Unknown  . Lac Bovis Other (See Comments)    lactose intolerant  . Pentazocine Lactate     Unknown    Past Medical History  Diagnosis Date  . Coronary artery disease     a. Low level exercise Lex MV 3/14: low risk, EF 62%, inf defect 2/2 diaph attenuation vs artifiact, small area of scar possible, no ischemia  . Hyperlipidemia   . Hypertension   . History  of seizure disorder   . History of atrial flutter   . Depression   . Back pain     persistent  . Hearing loss   . Diverticulitis   . H/O alcohol abuse   . Diabetes mellitus     controled by diet  . GERD (gastroesophageal reflux disease)   . Seizures   . Headache(784.0)   . Arthritis     of spine  . Sleep apnea     does not use CPAP  . Hx of echocardiogram     Echo 7/14:  Mod LVH, EF 55-60%, Gr 1 DD, mild MR, PASP 36  . AKI (acute kidney injury)     a. 6/14: resolved after d/c ARB;  b. RA U/S 7/14: no RA stenosis    Past Surgical History  Procedure Laterality Date  . Partial colectomy      for diverticuli  . Orchiectomy    . Umbilical hernia repair    . Cervical fusion    . Coronary artery bypass graft  2005     LIMA graft to LAD,saphenous vein graft to diag.,circumflex, marginal,and to the RCA  . Cardiac catheterization    . Appendectomy    . Esophagogastroduodenoscopy  02/10/2012    Procedure: ESOPHAGOGASTRODUODENOSCOPY (EGD);  Surgeon: Cleotis Nipper, MD;  Location: [facility] ENDOSCOPY;  Service: Endoscopy;  Laterality: N/A;  . Foreign body removal  02/10/2012    Procedure: FOREIGN BODY REMOVAL;  Surgeon: Cleotis Nipper, MD;  Location: [facility];  Service: Endoscopy;  Laterality: N/A;  . Back surgery    . Esophagogastroduodenoscopy  06/29/2012    Procedure: ESOPHAGOGASTRODUODENOSCOPY (EGD);  Surgeon: Winfield Cunas., MD;  Location: [facility];  Service: Endoscopy;  Laterality: N/A;  . Foreign body removal  06/29/2012    Procedure: FOREIGN BODY REMOVAL;  Surgeon: Winfield Cunas., MD;  Location: [facility] ENDOSCOPY;  Service: Endoscopy;  Laterality: N/A;  . Savory dilation  06/29/2012    Procedure: SAVORY DILATION;  Surgeon: Winfield Cunas., MD;  Location: [facility];  Service: Endoscopy;  Laterality: N/A;    History  Smoking status  . Never Smoker   Smokeless tobacco  . Never Used    History  Alcohol Use No    Comment: rare beer    Family History  Problem Relation Age of Onset  . Emphysema Mother     was a smoker  . Asthma Mother   . Heart disease Father   . Prostate cancer Paternal Grandfather   . Pancreatic cancer Paternal Uncle     Review of Systems: The review of systems is positive for  Chronic back pain. He complains of arthralgias in his shoulders, neck, and hips. All other systems were reviewed and are negative.  Physical Exam: BP 120/86  Pulse 71  Ht 5' 6"  (1.676 m)  Wt 222 lb 12.8 oz (101.061 kg)  BMI 35.98 kg/m2  SpO2 97% He is an obese Liaw male in no acute distress. The HEENT exam is normal. The carotids are 2+ without bruits.  There is no thyromegaly.  There is no JVD.  The lungs are clear.  The chest wall is non tender. He has a median sternotomy  scar.  The heart exam reveals a regular rate with a normal S1 and S2.  There are no murmurs, gallops, or rubs.  The PMI is not displaced.   Abdominal exam reveals good bowel sounds.There is no hepatosplenomegaly or tenderness.  There are no masses.  Exam  of the legs reveal no clubbing, cyanosis, or edema.  The legs are without rashes.  The distal pulses are intact.  Cranial nerves II - XII are intact.  Motor and sensory functions are intact.  The gait is normal.  LABORATORY DATA: Lab Results  Component Value Date   WBC 9.7 09/16/2012   HGB 16.0 09/16/2012   HCT 50.3 09/16/2012   PLT 187 04/29/2012   GLUCOSE 107* 02/22/2013   ALT 20 04/29/2012   AST 18 04/29/2012   NA 137 02/22/2013   K 3.7 02/22/2013   CL 101 02/22/2013   CREATININE 1.1 02/22/2013   BUN 19 02/22/2013   CO2 28 02/22/2013   PSA 0.66 04/09/2012   INR 1.10 04/29/2012   HGBA1C 6.3 02/09/2013     Assessment / Plan: 1. Coronary disease status post CABG in 2005. Myoview study in March showed no ischemia. Ejection fraction was normal. Recommend continue medical therapy with aspirin, beta blocker, and statin therapy. 2. Chronic dyspnea. There may be a component of diastolic dysfunction but he appears to be well compensated at this time. I suspect his breathing difficulty is more related to his pulmonary status. I've encouraged him to lose weight. We will continue Lasix 20 mg daily. 3. Hypertension-well controlled 4. Obesity 5. Diabetes mellitus type 2-on insulin 6. History of acute kidney injury on her ARB. Now improved. I strongly recommended that he avoid any nonsteroidal anti-inflammatory drugs including Celebrex.

## 2013-05-04 ENCOUNTER — Ambulatory Visit (INDEPENDENT_AMBULATORY_CARE_PROVIDER_SITE_OTHER): Payer: Medicare Other | Admitting: Adult Health

## 2013-05-04 ENCOUNTER — Encounter: Payer: Self-pay | Admitting: Adult Health

## 2013-05-04 VITALS — BP 126/74 | HR 71 | Temp 97.4°F | Ht 67.0 in | Wt 223.6 lb

## 2013-05-04 DIAGNOSIS — Z23 Encounter for immunization: Secondary | ICD-10-CM

## 2013-05-04 DIAGNOSIS — R0609 Other forms of dyspnea: Secondary | ICD-10-CM

## 2013-05-04 DIAGNOSIS — R0600 Dyspnea, unspecified: Secondary | ICD-10-CM

## 2013-05-04 NOTE — Progress Notes (Signed)
Patient ID: [name], male   DOB: 26-Oct-1945, 67 y.o.   MRN: [id number]  Subjective:    Patient ID: [name], male    DOB: 1945/12/18  MRN: [id number]    Brief patient profile:  34 yowm never smoker with obesity and osa/appliance dep with h/o ? Asthma as child and then needed inhalers with colds only  since 2004 referred by [name] for eval of sob x 2013 after eval by [name] for hiccups in 2014 dx'd with ? asbestosis   02/11/2013 1st pulmonary eval cc indolent onset progressive doe esp out in hot weather x one year with some dry cough on hot air exposure and even indoors has to stop about 150 yards nl pace with stress test by [facility] March 2014 with nl ef, low risk IHD.  Has used inhalers in past, but not in past year  Has noted some assoc with  swelling but no orthopnea.   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  >>stop metoprolol and ARB , rx bystolic   7/82/9562 Follow up  Returns for follow up 1 week follow up  Seen last week for new pulmonary consult for progressive dyspnea. Felt meds could be contributing to symptoms.  [name] were stopped . Rx bystolic  Tolerating meds well. However labs showed renal insufficiency  Worsened with scr at 2.72.  He was seen by his cardiolgist and repeat labs on 02/15/13 showed scr down at 1.2  B/p is controlled on present regimen.  Wt is down 2 lbs.  cXR last ov w/ no acute process noted.   rec No change rx   03/24/2013 f/u ov/[name] re sob Chief Complaint  Patient presents with  . Follow-up    Pt states that his breathing is unchanged since the last visit. He c/o increased non prod cough x 1 wk- seems worse with humid weather and in the late evening.   [facility], leg swelling resolved and kidney fx better but breathing no better in hot weather, does fine walking flat in cool climate like a mall. >no change s  05/04/2013 Follow up and med review  new med calendar - pt brought all meds  with him today.  reports breathing somewhat better since last ov.   We reviewed all his medications and organized them into a patient medication calendar with patient education. Patient does feel that his breathing has improved somewhat since his last visit. Patient denies any hemoptysis, orthopnea, PND, or leg swelling.    Current Medications, Allergies, Past Medical History, Past Surgical History, Family History, and Social History were reviewed in [employer] record.  ROS  The following are not active complaints unless bolded sore throat, dysphagia, dental problems, itching, sneezing,  nasal congestion or excess/ purulent secretions, ear ache,   fever, chills, sweats, unintended wt loss, pleuritic or exertional cp, hemoptysis,  orthopnea pnd or leg swelling, presyncope, palpitations, heartburn, abdominal pain, anorexia, nausea, vomiting, diarrhea  or change in bowel or urinary habits, change in stools or urine, dysuria,hematuria,  rash, arthralgias, visual complaints, headache, numbness weakness or ataxia or problems with walking or coordination,  change in mood/affect or memory.                Objective:   Physical Exam  amb obese wm nad    HEENT: nl dentition, turbinates, and orophanx. Nl external ear canals without cough reflex   NECK :  without JVD/Nodes/TM/ nl  carotid upstrokes bilaterally   LUNGS: no acc muscle use, clear to A and P bilaterally without cough on insp or exp maneuvers   CV:  RRR  no s3 or murmur or increase in P2,  Tr -1-edema   ABD:  soft and nontender with nl excursion in the supine position. No bruits or organomegaly, bowel sounds nl  MS:  warm without deformities, calf tenderness, cyanosis or clubbing  SKIN: warm and dry without lesions        CXR  02/11/2013 :   No active disease. No significant change     Assessment & Plan:

## 2013-05-04 NOTE — Assessment & Plan Note (Signed)
Patient's medications were reviewed today and patient education was given. Computerized medication calendar was adjusted/completed   Plan  Return in 4 week with PFT

## 2013-05-04 NOTE — Patient Instructions (Addendum)
Follow med calendar closely and bring to each visit.  Follow up Dr. [name]  In 4 weeks with PFT  Flu shot today

## 2013-05-05 DIAGNOSIS — L820 Inflamed seborrheic keratosis: Secondary | ICD-10-CM | POA: Diagnosis not present

## 2013-05-05 DIAGNOSIS — L570 Actinic keratosis: Secondary | ICD-10-CM | POA: Diagnosis not present

## 2013-05-05 DIAGNOSIS — L282 Other prurigo: Secondary | ICD-10-CM | POA: Diagnosis not present

## 2013-05-10 DIAGNOSIS — IMO0002 Reserved for concepts with insufficient information to code with codable children: Secondary | ICD-10-CM | POA: Diagnosis not present

## 2013-05-10 DIAGNOSIS — G4730 Sleep apnea, unspecified: Secondary | ICD-10-CM | POA: Diagnosis not present

## 2013-05-10 DIAGNOSIS — G4700 Insomnia, unspecified: Secondary | ICD-10-CM | POA: Diagnosis not present

## 2013-05-10 DIAGNOSIS — R404 Transient alteration of awareness: Secondary | ICD-10-CM | POA: Diagnosis not present

## 2013-05-10 NOTE — Addendum Note (Signed)
Addended by: Parke Poisson E on: 05/10/2013 09:39 AM   Modules accepted: Orders

## 2013-05-12 ENCOUNTER — Other Ambulatory Visit: Payer: Self-pay | Admitting: Internal Medicine

## 2013-05-26 DIAGNOSIS — N179 Acute kidney failure, unspecified: Secondary | ICD-10-CM | POA: Diagnosis not present

## 2013-06-02 DIAGNOSIS — M5137 Other intervertebral disc degeneration, lumbosacral region: Secondary | ICD-10-CM | POA: Diagnosis not present

## 2013-06-02 DIAGNOSIS — Z4789 Encounter for other orthopedic aftercare: Secondary | ICD-10-CM | POA: Diagnosis not present

## 2013-06-02 DIAGNOSIS — M545 Low back pain, unspecified: Secondary | ICD-10-CM | POA: Diagnosis not present

## 2013-06-02 DIAGNOSIS — Z9889 Other specified postprocedural states: Secondary | ICD-10-CM | POA: Insufficient documentation

## 2013-06-02 DIAGNOSIS — M5410 Radiculopathy, site unspecified: Secondary | ICD-10-CM | POA: Insufficient documentation

## 2013-06-02 DIAGNOSIS — IMO0002 Reserved for concepts with insufficient information to code with codable children: Secondary | ICD-10-CM | POA: Diagnosis not present

## 2013-06-02 DIAGNOSIS — M5126 Other intervertebral disc displacement, lumbar region: Secondary | ICD-10-CM | POA: Diagnosis not present

## 2013-06-02 DIAGNOSIS — Z981 Arthrodesis status: Secondary | ICD-10-CM | POA: Diagnosis not present

## 2013-06-09 ENCOUNTER — Ambulatory Visit (INDEPENDENT_AMBULATORY_CARE_PROVIDER_SITE_OTHER): Payer: Medicare Other | Admitting: Internal Medicine

## 2013-06-09 ENCOUNTER — Encounter: Payer: Self-pay | Admitting: Internal Medicine

## 2013-06-09 VITALS — BP 106/64 | HR 76 | Temp 97.5°F | Ht 65.0 in | Wt 218.0 lb

## 2013-06-09 DIAGNOSIS — R0609 Other forms of dyspnea: Secondary | ICD-10-CM

## 2013-06-09 DIAGNOSIS — R091 Pleurisy: Secondary | ICD-10-CM

## 2013-06-09 DIAGNOSIS — R0600 Dyspnea, unspecified: Secondary | ICD-10-CM

## 2013-06-09 DIAGNOSIS — J920 Pleural plaque with presence of asbestos: Secondary | ICD-10-CM

## 2013-06-09 DIAGNOSIS — T65891A Toxic effect of other specified substances, accidental (unintentional), initial encounter: Secondary | ICD-10-CM

## 2013-06-09 DIAGNOSIS — R05 Cough: Secondary | ICD-10-CM | POA: Diagnosis not present

## 2013-06-09 NOTE — Assessment & Plan Note (Signed)
Ct [facility] 11/04/2012 1. Normal heart size with changes of CABG. 2. Small hiatal hernia. 3. Bilateral pleural calcifications along the pleura anteriorly and posteriorly with pleural thickening in these locations. This is possibly related to remote inflammatory/traumatic etiology with asbestosis not excluded  pft's show boderline restriction but this could also be due to body habitus - clearly he has exposure hx and a ct c/w this exposure; the tougher call is whether or not it's causing now or ever will cause any respiratory symptoms and for now I believe the answer is no

## 2013-06-09 NOTE — Progress Notes (Signed)
Patient ID: [name], male    DOB: 07/30/1946  MRN: [id number]    Brief patient profile:  32 yowm never smoker with obesity and osa/appliance dep with h/o ? Asthma as child and then needed inhalers with colds only  since 2004 referred by [name] for eval of sob x 2013 after eval by [name] for hiccups in 2014 dx'd with ? asbestosis  History of Present Illness  02/11/2013 1st pulmonary eval cc indolent onset progressive doe esp out in hot weather x one year with some dry cough on hot air exposure and even indoors has to stop about 150 yards nl pace with stress test by [facility] March 2014 with nl ef, low risk IHD.  Has used inhalers in past, but not in past year  Has noted some assoc with  swelling but no orthopnea.   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  >>stop metoprolol and ARB , rx bystolic      7/42/5956 f/u ov/[name] re sob Chief Complaint  Patient presents with  . Follow-up    Pt states that his breathing is unchanged since the last visit. He c/o increased non prod cough x 1 wk- seems worse with humid weather and in the late evening.   [facility] and [facility], leg swelling resolved and kidney fx better but breathing no better in hot weather, does fine walking flat in cool climate like a mall. >no change rx  05/04/2013 Follow up and med review  new med calendar - pt brought all meds with him today.  reports breathing somewhat better since last ov.   We reviewed all his medications and organized them into a patient medication calendar with patient education. rec  no change rx   06/09/2013 f/u ov/Dannia Snook re: doe improving  Chief Complaint  Patient presents with  . Follow-up    Pt reports breathing is overall doing better. No new co's today.     Also dry cough for year daytime not noct sev times per day "like a sneeze"      No obvious day to day or daytime variabilty or excess or purulent mucus production or cp or chest tightness, subjective wheeze overt  sinus or hb symptoms. No unusual exp hx or h/o childhood pna/ asthma or knowledge of premature birth.  Sleeping ok without nocturnal  or early am exacerbation  of respiratory  c/o's or need for noct saba. Also denies any obvious fluctuation of symptoms with weather or environmental changes or other aggravating or alleviating factors except as outlined above   Current Medications, Allergies, Complete Past Medical History, Past Surgical History, Family History, and Social History were reviewed in [employer] record.  ROS  The following are not active complaints unless bolded sore throat, dysphagia, dental problems, itching, sneezing,  nasal congestion or excess/ purulent secretions, ear ache,   fever, chills, sweats, unintended wt loss, pleuritic or exertional cp, hemoptysis,  orthopnea pnd or leg swelling, presyncope, palpitations, heartburn, abdominal pain, anorexia, nausea, vomiting, diarrhea  or change in bowel or urinary habits, change in stools or urine, dysuria,hematuria,  rash, arthralgias, visual complaints, headache, numbness weakness or ataxia or problems with walking or coordination,  change in mood/affect or memory.                 Objective:   Physical Exam  amb obese wm nad  Wt Readings from Last 3 Encounters:  06/09/13 218 lb (98.884 kg)  05/04/13 223 lb 9.6  oz (101.424 kg)  04/28/13 222 lb 12.8 oz (101.061 kg)        HEENT: nl dentition, turbinates, and orophanx. Nl external ear canals without cough reflex   NECK :  without JVD/Nodes/TM/ nl carotid upstrokes bilaterally   LUNGS: no acc muscle use, clear to A and P bilaterally without cough on insp or exp maneuvers   CV:  RRR  no s3 or murmur or increase in P2,  Tr -bilateral ankle edema   ABD:  Obese soft and nontender with nl excursion in the supine position. No bruits or organomegaly, bowel sounds nl  MS:  warm without deformities, calf tenderness, cyanosis or clubbing  SKIN: warm  and dry without lesions        CXR  02/11/2013 :   No active disease. No significant change     Assessment & Plan:

## 2013-06-09 NOTE — Assessment & Plan Note (Signed)
-   pfts at [facility] 12/2012 FEV1  2.1 (71%) ratio 79 and DLCO 57 corrects to 82  - pFT's 06/09/2013  FEV 1 2.10 (78%) ratio 81 and no change p B2 and DLCO 75%   VC = 2.83=77% - 02/11/2013  Walked RA x 3 laps @ 185 ft each stopped due to end of study no desat    Main issue appears to be obesity/ decondtioning and perhaps element of diast chf/ cri with vol overload but does not need 02 or any pulmonary medication at present   Improving with regular exercise, needs f/u pft's and cxr in one year, sooner if needed

## 2013-06-09 NOTE — Patient Instructions (Signed)
For cough as needed use delsym as needed   GERD (REFLUX)  is an extremely common cause of respiratory symptoms, many times with no significant heartburn at all.    It can be treated with medication, but also with lifestyle changes including avoidance of late meals, excessive alcohol, smoking cessation, and avoid fatty foods, chocolate, peppermint, colas, red wine, and acidic juices such as orange juice.  NO MINT OR MENTHOL PRODUCTS SO NO COUGH DROPS  USE SUGARLESS CANDY INSTEAD (jolley ranchers or Stover's)  NO OIL BASED VITAMINS - use powdered substitutes.  Unless are losing ground with your breathing or your cough gets worse we will see you in one year for follow up cxr and pfts

## 2013-06-09 NOTE — Assessment & Plan Note (Signed)
Sporadic, worse in hot weather, daytime only so most likely not asthma could be pnds or gerd > reviewed rx and diet, f/u prn

## 2013-06-09 NOTE — Progress Notes (Signed)
PFT done today. 

## 2013-07-14 DIAGNOSIS — M25569 Pain in unspecified knee: Secondary | ICD-10-CM | POA: Diagnosis not present

## 2013-07-14 DIAGNOSIS — IMO0002 Reserved for concepts with insufficient information to code with codable children: Secondary | ICD-10-CM | POA: Diagnosis not present

## 2013-07-14 DIAGNOSIS — M2390 Unspecified internal derangement of unspecified knee: Secondary | ICD-10-CM | POA: Diagnosis not present

## 2013-07-14 DIAGNOSIS — M2240 Chondromalacia patellae, unspecified knee: Secondary | ICD-10-CM | POA: Diagnosis not present

## 2013-08-06 DIAGNOSIS — M25569 Pain in unspecified knee: Secondary | ICD-10-CM | POA: Diagnosis not present

## 2013-08-10 ENCOUNTER — Encounter: Payer: Self-pay | Admitting: Internal Medicine

## 2013-08-10 ENCOUNTER — Ambulatory Visit (INDEPENDENT_AMBULATORY_CARE_PROVIDER_SITE_OTHER): Payer: Medicare Other | Admitting: Internal Medicine

## 2013-08-10 VITALS — BP 110/70 | HR 73 | Temp 97.3°F | Ht 68.0 in | Wt 213.8 lb

## 2013-08-10 DIAGNOSIS — R05 Cough: Secondary | ICD-10-CM

## 2013-08-10 DIAGNOSIS — R059 Cough, unspecified: Secondary | ICD-10-CM

## 2013-08-10 MED ORDER — ACETAMINOPHEN-CODEINE #3 300-30 MG PO TABS: ORAL_TABLET | ORAL | Status: DC

## 2013-08-10 MED ORDER — PREDNISONE (PAK) 10 MG PO TABS: ORAL_TABLET | ORAL | Status: DC

## 2013-08-10 NOTE — Patient Instructions (Addendum)
Prednisone 10 mg take  4 each am x 2 days,   2 each am x 2 days,  1 each am x 2 days and stop (called in)  Add chlortrimeton 4 mg one at bedtime (over the counter)  Take delsym two tsp every 12 hours and supplement if needed with Tylenol #3 1 every 4 hours to suppress the urge to cough. Swallowing water or using ice chips/non mint and menthol containing candies (such as lifesavers or sugarless jolly ranchers) are also effective.  You should rest your voice and avoid activities that you know make you cough.  Once you have eliminated the cough for 3 straight days try reducing the tylenol #3 first,  then the delsym as tolerated.    GERD (REFLUX)  is an extremely common cause of respiratory symptoms, many times with no significant heartburn at all.    It can be treated with medication, but also with lifestyle changes including avoidance of late meals, excessive alcohol, smoking cessation, and avoid fatty foods, chocolate, peppermint, colas, red wine, and acidic juices such as orange juice.  NO MINT OR MENTHOL PRODUCTS SO NO COUGH DROPS  USE SUGARLESS CANDY INSTEAD (jolley ranchers or Stover's)  NO OIL BASED VITAMINS - use powdered substitutes.    Please schedule a follow up office visit in 4 weeks, sooner if needed

## 2013-08-10 NOTE — Progress Notes (Signed)
Patient ID: [name], male    DOB: [birth year]  MRN: [id number]    Brief patient profile:  30 yowm never smoker with obesity and osa/appliance dep with h/o ? Asthma as child and then needed inhalers with colds only  since 2004 referred by [name] for eval of sob x 2013 after eval by [name] for hiccups in 2014 dx'd with ? asbestosis  History of Present Illness  02/11/2013 1st pulmonary eval cc indolent onset progressive doe esp out in hot weather x one year with some dry cough on hot air exposure and even indoors has to stop about 150 yards nl pace with stress test by [facility] March 2014 with nl ef, low risk IHD.  Has used inhalers in past, but not in past year  Has noted some assoc with  swelling but no orthopnea.   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  >>stop metoprolol and ARB , rx bystolic      7/85/8850 f/u ov/[name] re sob Chief Complaint  Patient presents with  . Follow-up    Pt states that his breathing is unchanged since the last visit. He c/o increased non prod cough x 1 wk- seems worse with humid weather and in the late evening.   [facility], leg swelling resolved and kidney fx better but breathing no better in hot weather, does fine walking flat in cool climate like a mall. >no change rx  05/04/2013 Follow up and med review  new med calendar - pt brought all meds with him today.  reports breathing somewhat better since last ov.   We reviewed all his medications and organized them into a patient medication calendar with patient education. rec  no change rx   06/09/2013 f/u ov/[name] re: doe improving  Chief Complaint  Patient presents with  . Follow-up    Pt reports breathing is overall doing better. No new co's today.     Also dry cough for year daytime not noct sev times per day "like a sneeze"  rec For cough as needed use delsym as needed  GERD diet      08/10/2013  Acute  ov/[name] re:  Chief Complaint  Patient presents with   . Acute Visit    Pt c/o cough x 1 wk-worse for the past 2 days. Cough is prod cough with very minimal clear sputum. Cough is esp worse at night and keeps him awake.      Was better better but cough never went  Completely away always coughs at  bedtime     No obvious day to day or daytime variabilty or excess or purulent mucus production or sob/ cp or chest tightness, subjective wheeze overt sinus or hb symptoms. No unusual exp hx or h/o childhood pna/ asthma or knowledge of premature birth.  Sleeping ok without nocturnal  or early am exacerbation  of respiratory  c/o's or need for noct saba. Also denies any obvious fluctuation of symptoms with weather or environmental changes or other aggravating or alleviating factors except as outlined above   Current Medications, Allergies, Complete Past Medical History, Past Surgical History, Family History, and Social History were reviewed in [employer] record.  ROS  The following are not active complaints unless bolded sore throat, dysphagia, dental problems, itching, sneezing,  nasal congestion or excess/ purulent secretions, ear ache,   fever, chills, sweats, unintended wt loss, pleuritic or exertional cp, hemoptysis,  orthopnea pnd or leg swelling, presyncope,  palpitations, heartburn, abdominal pain, anorexia, nausea, vomiting, diarrhea  or change in bowel or urinary habits, change in stools or urine, dysuria,hematuria,  rash, arthralgias, visual complaints, headache, numbness weakness or ataxia or problems with walking or coordination,  change in mood/affect or memory.                 Objective:   Physical Exam  amb obese wm nad  08/10/2013     213  Wt Readings from Last 3 Encounters:  06/09/13 218 lb (98.884 kg)  05/04/13 223 lb 9.6 oz (101.424 kg)  04/28/13 222 lb 12.8 oz (101.061 kg)        HEENT: nl dentition, turbinates, and orophanx. Nl external ear canals without cough reflex   NECK :  without  JVD/Nodes/TM/ nl carotid upstrokes bilaterally   LUNGS: no acc muscle use, clear to A and P bilaterally without cough on insp or exp maneuvers   CV:  RRR  no s3 or murmur or increase in P2,  Tr -bilateral ankle edema   ABD:  Obese soft and nontender with nl excursion in the supine position. No bruits or organomegaly, bowel sounds nl  MS:  warm without deformities, calf tenderness, cyanosis or clubbing  SKIN: warm and dry without lesions        CXR  02/11/2013 :   No active disease. No significant change     Assessment & Plan:

## 2013-08-11 NOTE — Assessment & Plan Note (Signed)
The most common causes of chronic cough in immunocompetent adults include the following: upper airway cough syndrome (UACS), previously referred to as postnasal drip syndrome (PNDS), which is caused by variety of rhinosinus conditions; (2) asthma; (3) GERD; (4) chronic bronchitis from cigarette smoking or other inhaled environmental irritants; (5) nonasthmatic eosinophilic bronchitis; and (6) bronchiectasis.   These conditions, singly or in combination, have accounted for up to 94% of the causes of chronic cough in prospective studies.   Other conditions have constituted no >6% of the causes in prospective studies These have included bronchogenic carcinoma, chronic interstitial pneumonia, sarcoidosis, left ventricular failure, ACEI-induced cough, and aspiration from a condition associated with pharyngeal dysfunction.    Chronic cough is often simultaneously caused by more than one condition. A single cause has been found from 38 to 82% of the time, multiple causes from 18 to 62%. Multiply caused cough has been the result of three diseases up to 42% of the time.      Most likely thi sis  Classic Upper airway cough syndrome, so named because it's frequently impossible to sort out how much is  CR/sinusitis with freq throat clearing (which can be related to primary GERD)   vs  causing  secondary (" extra esophageal")  GERD from wide swings in gastric pressure that occur with throat clearing, often  promoting self use of mint and menthol lozenges that reduce the lower esophageal sphincter tone and exacerbate the problem further in a cyclical fashion.   These are the same pts (now being labeled as having "irritable larynx syndrome" by some cough centers) who not infrequently have a history of having failed to tolerate ace inhibitors,  dry powder inhalers or biphosphonates or report having atypical reflux symptoms that don't respond to standard doses of PPI , and are easily confused as having aecopd or asthma  flares by even experienced allergists/ pulmonologists.   Try adding 1st gen h1 and h2 at hs and eliminating cyclical cough then regroup

## 2013-08-13 ENCOUNTER — Telehealth: Payer: Self-pay | Admitting: Pulmonary Disease

## 2013-08-13 MED ORDER — ACETAMINOPHEN-CODEINE #3 300-30 MG PO TABS: ORAL_TABLET | ORAL | Status: DC

## 2013-08-13 NOTE — Telephone Encounter (Signed)
This is a MW patient- not SN. Last refilled 08-10-13 for #30; MW please advise if you are okay with refill this soon. Thanks.

## 2013-08-13 NOTE — Telephone Encounter (Signed)
Ok x one refill  

## 2013-08-13 NOTE — Telephone Encounter (Signed)
Called and lmom to make the pt aware that the rx for the tylenol #3 has been called to the pharmacy.  rx called to [facility] pharmacy

## 2013-08-14 DIAGNOSIS — I10 Essential (primary) hypertension: Secondary | ICD-10-CM | POA: Diagnosis not present

## 2013-08-14 DIAGNOSIS — R05 Cough: Secondary | ICD-10-CM | POA: Insufficient documentation

## 2013-08-14 DIAGNOSIS — Z951 Presence of aortocoronary bypass graft: Secondary | ICD-10-CM | POA: Insufficient documentation

## 2013-08-14 DIAGNOSIS — I2510 Atherosclerotic heart disease of native coronary artery without angina pectoris: Secondary | ICD-10-CM | POA: Insufficient documentation

## 2013-08-14 DIAGNOSIS — Z95818 Presence of other cardiac implants and grafts: Secondary | ICD-10-CM | POA: Diagnosis not present

## 2013-08-14 DIAGNOSIS — R079 Chest pain, unspecified: Secondary | ICD-10-CM | POA: Insufficient documentation

## 2013-08-14 DIAGNOSIS — E785 Hyperlipidemia, unspecified: Secondary | ICD-10-CM | POA: Insufficient documentation

## 2013-08-14 DIAGNOSIS — H9190 Unspecified hearing loss, unspecified ear: Secondary | ICD-10-CM | POA: Diagnosis not present

## 2013-08-14 DIAGNOSIS — M7989 Other specified soft tissue disorders: Secondary | ICD-10-CM | POA: Insufficient documentation

## 2013-08-14 DIAGNOSIS — E119 Type 2 diabetes mellitus without complications: Secondary | ICD-10-CM | POA: Insufficient documentation

## 2013-08-14 DIAGNOSIS — Z792 Long term (current) use of antibiotics: Secondary | ICD-10-CM | POA: Insufficient documentation

## 2013-08-14 DIAGNOSIS — Z87448 Personal history of other diseases of urinary system: Secondary | ICD-10-CM | POA: Insufficient documentation

## 2013-08-14 DIAGNOSIS — M129 Arthropathy, unspecified: Secondary | ICD-10-CM | POA: Insufficient documentation

## 2013-08-14 DIAGNOSIS — F3289 Other specified depressive episodes: Secondary | ICD-10-CM | POA: Insufficient documentation

## 2013-08-14 DIAGNOSIS — G40909 Epilepsy, unspecified, not intractable, without status epilepticus: Secondary | ICD-10-CM | POA: Diagnosis not present

## 2013-08-14 DIAGNOSIS — F329 Major depressive disorder, single episode, unspecified: Secondary | ICD-10-CM | POA: Diagnosis not present

## 2013-08-14 DIAGNOSIS — Z7982 Long term (current) use of aspirin: Secondary | ICD-10-CM | POA: Insufficient documentation

## 2013-08-14 DIAGNOSIS — IMO0002 Reserved for concepts with insufficient information to code with codable children: Secondary | ICD-10-CM | POA: Diagnosis not present

## 2013-08-14 DIAGNOSIS — R059 Cough, unspecified: Secondary | ICD-10-CM | POA: Insufficient documentation

## 2013-08-14 DIAGNOSIS — Z79899 Other long term (current) drug therapy: Secondary | ICD-10-CM | POA: Diagnosis not present

## 2013-08-15 ENCOUNTER — Emergency Department (HOSPITAL_BASED_OUTPATIENT_CLINIC_OR_DEPARTMENT_OTHER): Payer: Medicare Other

## 2013-08-15 ENCOUNTER — Encounter (HOSPITAL_BASED_OUTPATIENT_CLINIC_OR_DEPARTMENT_OTHER): Payer: Self-pay | Admitting: Emergency Medicine

## 2013-08-15 ENCOUNTER — Emergency Department (HOSPITAL_BASED_OUTPATIENT_CLINIC_OR_DEPARTMENT_OTHER)
Admission: EM | Admit: 2013-08-15 | Discharge: 2013-08-15 | Disposition: A | Discharge status: Home / Self Care | Payer: Medicare Other | Attending: Emergency Medicine | Admitting: Emergency Medicine

## 2013-08-15 DIAGNOSIS — R05 Cough: Secondary | ICD-10-CM

## 2013-08-15 LAB — CBC WITH DIFFERENTIAL/PLATELET
MCH: 32.8 pg (ref 26.0–34.0)
MCHC: 33.8 g/dL (ref 30.0–36.0)
Myelocytes: 0 %
Neutro Abs: 7.8 10*3/uL — ABNORMAL HIGH (ref 1.7–7.7)
Neutrophils Relative %: 73 % (ref 43–77)
Platelets: 266 10*3/uL (ref 150–400)
Promyelocytes Absolute: 0 %
RBC: 4.88 MIL/uL (ref 4.22–5.81)
nRBC: 0 /100 WBC

## 2013-08-15 LAB — BASIC METABOLIC PANEL
Calcium: 9.2 mg/dL (ref 8.4–10.5)
GFR calc Af Amer: 87 mL/min — ABNORMAL LOW (ref >90)
GFR calc non Af Amer: 75 mL/min — ABNORMAL LOW (ref >90)
Glucose, Bld: 116 mg/dL — ABNORMAL HIGH (ref 70–99)
Sodium: 138 mEq/L (ref 135–145)

## 2013-08-15 LAB — PRO B NATRIURETIC PEPTIDE: Pro B Natriuretic peptide (BNP): 143.8 pg/mL — ABNORMAL HIGH (ref 0–125)

## 2013-08-15 LAB — D-DIMER, QUANTITATIVE: D-Dimer, Quant: <0.27 ug/mL-FEU (ref 0.00–0.48)

## 2013-08-15 NOTE — ED Notes (Addendum)
Pt c/o cough for past 2 weeks. States has been a dry cough but states past 2 days productive and green in color. Denies sob.  States has seen his MD for same and not sure of dx. Denies fevers. Decreased appetite per pt. Has been treated with prednisone

## 2013-08-15 NOTE — ED Provider Notes (Signed)
CSN: [id number]     Arrival date & time 08/14/13  2348 History   First MD Initiated Contact with Patient 08/15/13 [phone]     Chief Complaint  Patient presents with  . Cough   (Consider location/radiation/quality/duration/timing/severity/associated sxs/prior Treatment) HPI Comments: Pt comes in with cc of cough x 2 weeks. PMHx of CAD, asbestosis exposure - but no diagnoses of primary lung dz. Seen by Pulmonary medicine - but the tx implemented by them has given him no relief. Pt's cough is dry, and he has some pleuritic pain with his. There is no personal hx of cancer, and no risk factors for DVt or PE. Pt is on lasix, and has some diastolic dysfunction. He does indicate new swelling in his legs, and starting of lasix. Pt also reports that his cough is worse when laying supine. He has no exertional dib, or chest pain.   The history is provided by the patient and medical records.    Past Medical History  Diagnosis Date  . Coronary artery disease     a. Low level exercise Lex MV 3/14: low risk, EF 62%, inf defect 2/2 diaph attenuation vs artifiact, small area of scar possible, no ischemia  . Hyperlipidemia   . Hypertension   . History of seizure disorder   . History of atrial flutter   . Depression   . Back pain     persistent  . Hearing loss   . Diverticulitis   . H/O alcohol abuse   . Diabetes mellitus     controled by diet  . GERD (gastroesophageal reflux disease)   . Seizures   . Headache(784.0)   . Arthritis     of spine  . Sleep apnea     does not use CPAP  . Hx of echocardiogram     Echo 7/14:  Mod LVH, EF 55-60%, Gr 1 DD, mild MR, PASP 36  . AKI (acute kidney injury)     a. 6/14: resolved after d/c ARB;  b. RA U/S 7/14: no RA stenosis   Past Surgical History  Procedure Laterality Date  . Partial colectomy      for diverticuli  . Orchiectomy    . Umbilical hernia repair    . Cervical fusion    . Coronary artery bypass graft  2005    LIMA graft to LAD,saphenous vein  graft to diag.,circumflex, marginal,and to the RCA  . Cardiac catheterization    . Appendectomy    . Esophagogastroduodenoscopy  02/10/2012    Procedure: ESOPHAGOGASTRODUODENOSCOPY (EGD);  Surgeon: Cleotis Nipper, MD;  Location: [facility] ENDOSCOPY;  Service: Endoscopy;  Laterality: N/A;  . Foreign body removal  02/10/2012    Procedure: FOREIGN BODY REMOVAL;  Surgeon: Cleotis Nipper, MD;  Location: [facility];  Service: Endoscopy;  Laterality: N/A;  . Back surgery    . Esophagogastroduodenoscopy  06/29/2012    Procedure: ESOPHAGOGASTRODUODENOSCOPY (EGD);  Surgeon: Winfield Cunas., MD;  Location: [facility] ENDOSCOPY;  Service: Endoscopy;  Laterality: N/A;  . Foreign body removal  06/29/2012    Procedure: FOREIGN BODY REMOVAL;  Surgeon: Winfield Cunas., MD;  Location: [facility] ENDOSCOPY;  Service: Endoscopy;  Laterality: N/A;  . Savory dilation  06/29/2012    Procedure: SAVORY DILATION;  Surgeon: Winfield Cunas., MD;  Location: [facility] ENDOSCOPY;  Service: Endoscopy;  Laterality: N/A;   Family History  Problem Relation Age of Onset  . Emphysema Mother     was a smoker  . Asthma Mother   .  Heart disease Father   . Prostate cancer Paternal Grandfather   . Pancreatic cancer Paternal Uncle    History  Substance Use Topics  . Smoking status: Never Smoker   . Smokeless tobacco: Never Used  . Alcohol Use: No     Comment: rare beer    Review of Systems  Constitutional: Negative for fever, chills and activity change.  Eyes: Negative for visual disturbance.  Respiratory: Positive for cough. Negative for chest tightness, shortness of breath, wheezing and stridor.   Cardiovascular: Positive for chest pain and leg swelling. Negative for palpitations.  Gastrointestinal: Negative for abdominal distention.  Genitourinary: Negative for dysuria, enuresis and difficulty urinating.  Musculoskeletal: Negative for arthralgias and neck pain.  Neurological: Negative for dizziness, light-headedness and headaches.   Psychiatric/Behavioral: Negative for confusion.    Allergies  Cephalexin; Doxycycline; Lac bovis; and Pentazocine lactate  Home Medications   Current Outpatient Rx  Name  Route  Sig  Dispense  Refill  . acetaminophen-codeine (TYLENOL #3) 300-30 MG per tablet      One every 4 hours until cough is gone   30 tablet   0   . aspirin 325 MG tablet   Oral   Take 325 mg by mouth daily.         Marland Kitchen atorvastatin (LIPITOR) 80 MG tablet      TAKE 1 TABLET BY MOUTH EVERY NIGHT AT BEDTIME   30 tablet   3   . DULoxetine (CYMBALTA) 60 MG capsule   Oral   Take 60 mg by mouth daily.         . furosemide (LASIX) 20 MG tablet   Oral   Take 20 mg by mouth daily. And 1 extra tablet as needed         . gabapentin (NEURONTIN) 600 MG tablet   Oral   Take 600 mg by mouth at bedtime.         Marland Kitchen loperamide (IMODIUM) 2 MG capsule      Per bottle as needed         . methocarbamol (ROBAXIN) 750 MG tablet   Oral   Take 750 mg by mouth 3 (three) times daily.         . metroNIDAZOLE (FLAGYL) 500 MG tablet      1 tablet every 3 days         . Multiple Vitamins-Minerals (MULTIVITAMIN WITH MINERALS) tablet   Oral   Take 1 tablet by mouth every morning.          . Nebivolol HCl (BYSTOLIC) 20 MG TABS   Oral   Take 1 tablet (20 mg total) by mouth 2 (two) times daily.   60 tablet   5   . nitroGLYCERIN (NITROSTAT) 0.4 MG SL tablet   Sublingual   Place 1 tablet (0.4 mg total) under the tongue every 5 (five) minutes as needed. For chest pain   25 tablet   6   . omeprazole (PRILOSEC) 20 MG capsule      2 capsules by mouth before first and last meals of the day         . predniSONE (STERAPRED UNI-PAK) 10 MG tablet      Prednisone 10 mg take  4 each am x 2 days,   2 each am x 2 days,  1 each am x2days and stop   14 tablet   0   . testosterone cypionate (DEPOTESTOTERONE CYPIONATE) 200 MG/ML injection   Intramuscular   Inject into  the muscle every 14 (fourteen) days.           BP 115/63  Pulse 82  Temp(Src) 97.8 F (36.6 C) (Oral)  Resp 20  Ht 5' 8"  (1.727 m)  Wt 213 lb (96.616 kg)  BMI 32.39 kg/m2  SpO2 95% Physical Exam  Nursing note and vitals reviewed. Constitutional: He is oriented to person, place, and time. He appears well-developed.  HENT:  Head: Normocephalic and atraumatic.  Eyes: Conjunctivae and EOM are normal. Pupils are equal, round, and reactive to light.  Neck: Normal range of motion. Neck supple.  Cardiovascular: Normal rate and regular rhythm.   Pulmonary/Chest: Effort normal and breath sounds normal.  Abdominal: Soft. Bowel sounds are normal. He exhibits no distension. There is no tenderness. There is no rebound and no guarding.  Musculoskeletal:  Unilateral RLE swelling (1 cm) Bilateral trace, to 1+ pitting edema.  Neurological: He is alert and oriented to person, place, and time.  Skin: Skin is warm.    ED Course  Procedures (including critical care time) Labs Review Labs Reviewed - No data to display Imaging Review Dg Chest 2 View  08/15/2013   CLINICAL DATA:  67 year old male with cough  EXAM: CHEST  2 VIEW  COMPARISON:  02/11/2013 and prior chest radiographs dating back to 09/01/2009.  FINDINGS: Mild cardiomegaly and CABG changes noted.  There is no evidence of focal airspace disease, pulmonary edema, suspicious pulmonary nodule/mass, pleural effusion, or pneumothorax. No acute bony abnormalities are identified.  IMPRESSION: No evidence of acute cardiopulmonary disease.   Electronically Signed   By: Hassan Rowan M.D.   On: 08/15/2013 00:51    EKG Interpretation   None       MDM  No diagnosis found.  Pt comes in w/ cc of cough. Pt has hx of CAD, ? Diastolic CHF. No hx of PE, DVT, and no hx of lung dz. Pt is not a smoker, no fevers, cough is non productive. No new meds, not on lisinopril.  Will get BNP, CXR here. I dont think there is any further ED interrogation needed for the cough.  Will get dimer for the  lower extremity swelling. No suspicion for PE at all. Well score for DVT is 1.     Varney Biles, MD 08/15/13 [phone]

## 2013-08-15 NOTE — ED Notes (Signed)
Reports has seen PCP for cough lasting approx 1 month. Started on prednisone taper (last dose today) delsym and tylenol #3 with little relief

## 2013-08-15 NOTE — ED Notes (Signed)
MD at bedside. 

## 2013-08-16 ENCOUNTER — Telehealth: Payer: Self-pay | Admitting: Internal Medicine

## 2013-08-16 MED ORDER — AMOXICILLIN-POT CLAVULANATE 875-125 MG PO TABS: 1.0000 | ORAL_TABLET | Freq: Two times a day (BID) | ORAL | Status: DC

## 2013-08-16 NOTE — Telephone Encounter (Signed)
lmtcb x1 

## 2013-08-16 NOTE — Telephone Encounter (Signed)
Stop chlortrimeton Augmentin 875 mg take one pill twice daily  X 10 days - take at breakfast and supper with large glass of water.  It would help reduce the usual side effects (diarrhea and yeast infections) if you ate cultured yogurt at lunch.   CT sinus if not better  He is allergic to cephalexin but not clear if really allergy - if so no choice but avelox 400 mg daily x 10 days (much more expensive but not a distant cousin of cphalexin which is the case with augmentin

## 2013-08-16 NOTE — Telephone Encounter (Signed)
Spoke with pt. States he was to call if his cough got worse. Cough is now productive of green mucus. Has been taking Omeprazole daily as instructed. Wants MW recs.  MW - please advise. Thanks.

## 2013-08-16 NOTE — Telephone Encounter (Signed)
Rx has been sent in. Pt is aware to stop Chlortrimeton

## 2013-08-16 NOTE — Telephone Encounter (Signed)
Pt is returning call.  [name] ° °

## 2013-08-23 ENCOUNTER — Other Ambulatory Visit: Payer: Self-pay | Admitting: Internal Medicine

## 2013-08-23 NOTE — Telephone Encounter (Signed)
Pt was given #30 tabs on 12/19 Pls advise if okay to refill thanks

## 2013-08-25 ENCOUNTER — Other Ambulatory Visit: Payer: Self-pay | Admitting: Gastroenterology

## 2013-08-25 DIAGNOSIS — K912 Postsurgical malabsorption, not elsewhere classified: Secondary | ICD-10-CM | POA: Diagnosis not present

## 2013-08-25 DIAGNOSIS — R066 Hiccough: Secondary | ICD-10-CM

## 2013-08-25 DIAGNOSIS — Z8709 Personal history of other diseases of the respiratory system: Secondary | ICD-10-CM | POA: Diagnosis not present

## 2013-08-25 DIAGNOSIS — K219 Gastro-esophageal reflux disease without esophagitis: Secondary | ICD-10-CM | POA: Diagnosis not present

## 2013-08-27 ENCOUNTER — Ambulatory Visit
Admission: RE | Admit: 2013-08-27 | Discharge: 2013-08-27 | Disposition: A | Discharge status: Home / Self Care | Payer: Medicare Other | Source: Ambulatory Visit | Attending: Gastroenterology | Admitting: Gastroenterology

## 2013-08-27 DIAGNOSIS — R066 Hiccough: Secondary | ICD-10-CM

## 2013-08-27 DIAGNOSIS — R05 Cough: Secondary | ICD-10-CM | POA: Diagnosis not present

## 2013-08-27 DIAGNOSIS — R059 Cough, unspecified: Secondary | ICD-10-CM | POA: Diagnosis not present

## 2013-08-27 DIAGNOSIS — J984 Other disorders of lung: Secondary | ICD-10-CM | POA: Diagnosis not present

## 2013-08-27 DIAGNOSIS — Z8709 Personal history of other diseases of the respiratory system: Secondary | ICD-10-CM

## 2013-08-27 DIAGNOSIS — J988 Other specified respiratory disorders: Secondary | ICD-10-CM | POA: Diagnosis not present

## 2013-08-27 MED ORDER — IOHEXOL 300 MG/ML  SOLN
75.0000 mL | Freq: Once | INTRAMUSCULAR | Status: AC | PRN
  Administered 2013-08-27: 75 mL via INTRAVENOUS

## 2013-09-08 ENCOUNTER — Ambulatory Visit (INDEPENDENT_AMBULATORY_CARE_PROVIDER_SITE_OTHER): Payer: Medicare Other | Admitting: Internal Medicine

## 2013-09-08 ENCOUNTER — Encounter: Payer: Self-pay | Admitting: Internal Medicine

## 2013-09-08 ENCOUNTER — Ambulatory Visit: Payer: Medicare Other | Admitting: Internal Medicine

## 2013-09-08 VITALS — BP 124/80 | HR 80 | Temp 97.4°F | Ht 68.0 in | Wt 214.4 lb

## 2013-09-08 DIAGNOSIS — M25569 Pain in unspecified knee: Secondary | ICD-10-CM | POA: Diagnosis not present

## 2013-09-08 DIAGNOSIS — R059 Cough, unspecified: Secondary | ICD-10-CM

## 2013-09-08 DIAGNOSIS — T65891A Toxic effect of other specified substances, accidental (unintentional), initial encounter: Secondary | ICD-10-CM

## 2013-09-08 DIAGNOSIS — IMO0002 Reserved for concepts with insufficient information to code with codable children: Secondary | ICD-10-CM | POA: Diagnosis not present

## 2013-09-08 DIAGNOSIS — M2390 Unspecified internal derangement of unspecified knee: Secondary | ICD-10-CM | POA: Diagnosis not present

## 2013-09-08 DIAGNOSIS — R05 Cough: Secondary | ICD-10-CM | POA: Diagnosis not present

## 2013-09-08 DIAGNOSIS — M2240 Chondromalacia patellae, unspecified knee: Secondary | ICD-10-CM | POA: Diagnosis not present

## 2013-09-08 DIAGNOSIS — R091 Pleurisy: Secondary | ICD-10-CM | POA: Diagnosis not present

## 2013-09-08 DIAGNOSIS — J920 Pleural plaque with presence of asbestos: Secondary | ICD-10-CM

## 2013-09-08 MED ORDER — ACETAMINOPHEN-CODEINE #3 300-30 MG PO TABS: ORAL_TABLET | ORAL | Status: DC

## 2013-09-08 NOTE — Patient Instructions (Addendum)
You microscropic evidence supporting asbestos changes in your lungs which may cause you to cough with deep breathing in but otherwise at this point does not have any other obvious impact on your health but could some day.  Return in 06/09/14 for pfts

## 2013-09-08 NOTE — Progress Notes (Signed)
Patient ID: [name], male    DOB: 16-Jul-1946  MRN: [id number]    Brief patient profile:  62 yowm never smoker with obesity and osa/appliance dep with h/o ? Asthma as child and then needed inhalers with colds only  since 2004 referred by Dr Cleta Alberts for eval of sob x 2013 after eval by Susa Simmonds for hiccups in 2014 dx'd with ? asbestosis  History of Present Illness  02/11/2013 1st pulmonary eval cc indolent onset progressive doe esp out in hot weather x one year with some dry cough on hot air exposure and even indoors has to stop about 150 yards nl pace with stress test by [facility] March 2014 with nl ef, low risk IHD.  Has used inhalers in past, but not in past year  Has noted some assoc with  swelling but no orthopnea.   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  >>stop metoprolol and ARB , rx bystolic      03/24/2013 f/u ov/Terrea Bruster re sob Chief Complaint  Patient presents with  . Follow-up    Pt states that his breathing is unchanged since the last visit. He c/o increased non prod cough x 1 wk- seems worse with humid weather and in the late evening.   [facility] and [facility], leg swelling resolved and kidney fx better but breathing no better in hot weather, does fine walking flat in cool climate like a mall. >no change rx  05/04/2013 Follow up and med review  new med calendar - pt brought all meds with him today.  reports breathing somewhat better since last ov.   We reviewed all his medications and organized them into a patient medication calendar with patient education. rec  no change rx   06/09/2013 f/u ov/Lum Stillinger re: doe improving  Chief Complaint  Patient presents with  . Follow-up    Pt reports breathing is overall doing better. No new co's today.     Also dry cough for year daytime not noct sev times per day "like a sneeze"  rec For cough as needed use delsym as needed  GERD diet      08/10/2013  Acute  ov/Kylieann Eagles re: chronic cough  Chief Complaint  Patient  presents with  . Acute Visit    Pt c/o cough x 1 wk-worse for the past 2 days. Cough is prod cough with very minimal clear sputum. Cough is esp worse at night and keeps him awake.   Was better better but cough never went  Completely away-  always coughs at  bedtime  rec Prednisone 10 mg take  4 each am x 2 days,   2 each am x 2 days,  1 each am x 2 days and stop (called in) Add chlortrimeton 4 mg one at bedtime (over the counter) Take delsym two tsp every 12 hours and supplement if needed with Tylenol #3 1 every 4 hours to suppress the urge to cough. Swallowing water or using ice chips/non mint and menthol containing candies (such as lifesavers or sugarless jolly ranchers) are also effective.  You should rest your voice and avoid activities that you know make you cough. Once you have eliminated the cough for 3 straight days try reducing the tylenol #3 first,  then the delsym as tolerated.   GERD diet     09/08/2013 f/u ov/Shahan Starks re: cough/ no med calendar Chief Complaint  Patient presents with  . Follow-up    Cough is much improved since last visit.  No new co's today.   cough only deep breath in - not limited by sob from desired activities     No obvious day to day or daytime variabilty or excess or purulent mucus production or sob/ cp or chest tightness, subjective wheeze overt sinus or hb symptoms. No unusual exp hx or h/o childhood pna/ asthma or knowledge of premature birth.  Sleeping ok without nocturnal  or early am exacerbation  of respiratory  c/o's or need for noct saba. Also denies any obvious fluctuation of symptoms with weather or environmental changes or other aggravating or alleviating factors except as outlined above   Current Medications, Allergies, Complete Past Medical History, Past Surgical History, Family History, and Social History were reviewed in [employer] record.  ROS  The following are not active complaints unless bolded sore throat,  dysphagia, dental problems, itching, sneezing,  nasal congestion or excess/ purulent secretions, ear ache,   fever, chills, sweats, unintended wt loss, pleuritic or exertional cp, hemoptysis,  orthopnea pnd or leg swelling, presyncope, palpitations, heartburn, abdominal pain, anorexia, nausea, vomiting, diarrhea  or change in bowel or urinary habits, change in stools or urine, dysuria,hematuria,  rash, arthralgias, visual complaints, headache, numbness weakness or ataxia or problems with walking or coordination,  change in mood/affect or memory.              Objective:   Physical Exam  amb obese wm nad  08/10/2013     213 > 09/08/2013 214  Wt Readings from Last 3 Encounters:  06/09/13 218 lb (98.884 kg)  05/04/13 223 lb 9.6 oz (101.424 kg)  04/28/13 222 lb 12.8 oz (101.061 kg)        HEENT: nl dentition, turbinates, and orophanx. Nl external ear canals without cough reflex   NECK :  without JVD/Nodes/TM/ nl carotid upstrokes bilaterally   LUNGS: no acc muscle use, clear to A and P bilaterally with cough on deep insp   CV:  RRR  no s3 or murmur or increase in P2,  Tr -bilateral ankle edema   ABD:  Obese soft and nontender with nl excursion in the supine position. No bruits or organomegaly, bowel sounds nl  MS:  warm without deformities, calf tenderness, cyanosis or clubbing  SKIN: warm and dry without lesions        CXR  08/15/13 No evidence of acute cardiopulmonary disease.      Assessment & Plan:

## 2013-09-09 NOTE — Assessment & Plan Note (Signed)
Improved on rx for upper airway cough syndrome. He still coughs on really deep insp which may be related to asbestos related lung dz but this is not likely to respond to any medications x cough suppression, which is not really indicated here,

## 2013-09-09 NOTE — Assessment & Plan Note (Signed)
Ct [facility] 11/04/2012 1. Normal heart size with changes of CABG. 2. Small hiatal hernia. 3. Bilateral pleural calcifications along the pleura anteriorly and posteriorly with pleural thickening in these locations. This is possibly related to remote inflammatory/traumatic etiology with asbestosis not excluded  No evidence of signficant parenchymal involvement though the cough on deep insp is suggestive  rec f/u pft's yearly to see if any progressive loss of vol or dlco

## 2013-09-13 ENCOUNTER — Encounter (HOSPITAL_COMMUNITY): Payer: Self-pay | Admitting: Emergency Medicine

## 2013-09-13 ENCOUNTER — Emergency Department (HOSPITAL_COMMUNITY): Payer: Medicare Other

## 2013-09-13 ENCOUNTER — Emergency Department (HOSPITAL_COMMUNITY)
Admission: EM | Admit: 2013-09-13 | Discharge: 2013-09-13 | Disposition: A | Discharge status: Home / Self Care | Payer: Medicare Other | Attending: Emergency Medicine | Admitting: Emergency Medicine

## 2013-09-13 DIAGNOSIS — G4730 Sleep apnea, unspecified: Secondary | ICD-10-CM | POA: Insufficient documentation

## 2013-09-13 DIAGNOSIS — E785 Hyperlipidemia, unspecified: Secondary | ICD-10-CM | POA: Diagnosis not present

## 2013-09-13 DIAGNOSIS — Z8669 Personal history of other diseases of the nervous system and sense organs: Secondary | ICD-10-CM | POA: Insufficient documentation

## 2013-09-13 DIAGNOSIS — F3289 Other specified depressive episodes: Secondary | ICD-10-CM | POA: Diagnosis not present

## 2013-09-13 DIAGNOSIS — F329 Major depressive disorder, single episode, unspecified: Secondary | ICD-10-CM | POA: Diagnosis not present

## 2013-09-13 DIAGNOSIS — Z792 Long term (current) use of antibiotics: Secondary | ICD-10-CM | POA: Diagnosis not present

## 2013-09-13 DIAGNOSIS — H9190 Unspecified hearing loss, unspecified ear: Secondary | ICD-10-CM | POA: Diagnosis not present

## 2013-09-13 DIAGNOSIS — E119 Type 2 diabetes mellitus without complications: Secondary | ICD-10-CM | POA: Diagnosis not present

## 2013-09-13 DIAGNOSIS — R066 Hiccough: Secondary | ICD-10-CM

## 2013-09-13 DIAGNOSIS — Z9889 Other specified postprocedural states: Secondary | ICD-10-CM | POA: Insufficient documentation

## 2013-09-13 DIAGNOSIS — R9431 Abnormal electrocardiogram [ECG] [EKG]: Secondary | ICD-10-CM | POA: Diagnosis not present

## 2013-09-13 DIAGNOSIS — M479 Spondylosis, unspecified: Secondary | ICD-10-CM | POA: Insufficient documentation

## 2013-09-13 DIAGNOSIS — Z79899 Other long term (current) drug therapy: Secondary | ICD-10-CM | POA: Diagnosis not present

## 2013-09-13 DIAGNOSIS — Z87448 Personal history of other diseases of urinary system: Secondary | ICD-10-CM | POA: Diagnosis not present

## 2013-09-13 DIAGNOSIS — R1110 Vomiting, unspecified: Secondary | ICD-10-CM | POA: Insufficient documentation

## 2013-09-13 DIAGNOSIS — I10 Essential (primary) hypertension: Secondary | ICD-10-CM | POA: Insufficient documentation

## 2013-09-13 DIAGNOSIS — I2510 Atherosclerotic heart disease of native coronary artery without angina pectoris: Secondary | ICD-10-CM | POA: Insufficient documentation

## 2013-09-13 DIAGNOSIS — Z7982 Long term (current) use of aspirin: Secondary | ICD-10-CM | POA: Diagnosis not present

## 2013-09-13 DIAGNOSIS — R059 Cough, unspecified: Secondary | ICD-10-CM | POA: Insufficient documentation

## 2013-09-13 DIAGNOSIS — Z951 Presence of aortocoronary bypass graft: Secondary | ICD-10-CM | POA: Diagnosis not present

## 2013-09-13 DIAGNOSIS — K219 Gastro-esophageal reflux disease without esophagitis: Secondary | ICD-10-CM | POA: Diagnosis not present

## 2013-09-13 DIAGNOSIS — R05 Cough: Secondary | ICD-10-CM | POA: Insufficient documentation

## 2013-09-13 DIAGNOSIS — R109 Unspecified abdominal pain: Secondary | ICD-10-CM | POA: Diagnosis not present

## 2013-09-13 LAB — PRO B NATRIURETIC PEPTIDE: PRO B NATRI PEPTIDE: 194.9 pg/mL — AB (ref 0–125)

## 2013-09-13 LAB — CBC WITH DIFFERENTIAL/PLATELET
BASOS PCT: 0 % (ref 0–1)
Basophils Absolute: 0 10*3/uL (ref 0.0–0.1)
EOS ABS: 0.1 10*3/uL (ref 0.0–0.7)
EOS PCT: 1 % (ref 0–5)
HEMATOCRIT: 43.5 % (ref 39.0–52.0)
HEMOGLOBIN: 15.1 g/dL (ref 13.0–17.0)
LYMPHS ABS: 2.2 10*3/uL (ref 0.7–4.0)
Lymphocytes Relative: 19 % (ref 12–46)
MCH: 32.6 pg (ref 26.0–34.0)
MCHC: 34.7 g/dL (ref 30.0–36.0)
MCV: 94 fL (ref 78.0–100.0)
MONO ABS: 1.4 10*3/uL — AB (ref 0.1–1.0)
MONOS PCT: 12 % (ref 3–12)
Neutro Abs: 8.1 10*3/uL — ABNORMAL HIGH (ref 1.7–7.7)
Neutrophils Relative %: 69 % (ref 43–77)
Platelets: 215 10*3/uL (ref 150–400)
RBC: 4.63 MIL/uL (ref 4.22–5.81)
RDW: 13.2 % (ref 11.5–15.5)
WBC: 11.7 10*3/uL — ABNORMAL HIGH (ref 4.0–10.5)

## 2013-09-13 LAB — COMPREHENSIVE METABOLIC PANEL
ALK PHOS: 60 U/L (ref 39–117)
ALT: 20 U/L (ref 0–53)
AST: 15 U/L (ref 0–37)
Albumin: 3.5 g/dL (ref 3.5–5.2)
BUN: 23 mg/dL (ref 6–23)
CALCIUM: 8.7 mg/dL (ref 8.4–10.5)
CO2: 28 mEq/L (ref 19–32)
CREATININE: 0.84 mg/dL (ref 0.50–1.35)
Chloride: 96 mEq/L (ref 96–112)
GFR calc non Af Amer: 87 mL/min — ABNORMAL LOW (ref >90)
GLUCOSE: 118 mg/dL — AB (ref 70–99)
POTASSIUM: 4.2 meq/L (ref 3.7–5.3)
Sodium: 137 mEq/L (ref 137–147)
TOTAL PROTEIN: 7 g/dL (ref 6.0–8.3)
Total Bilirubin: 0.2 mg/dL — ABNORMAL LOW (ref 0.3–1.2)

## 2013-09-13 LAB — GLUCOSE, CAPILLARY: Glucose-Capillary: 110 mg/dL — ABNORMAL HIGH (ref 70–99)

## 2013-09-13 LAB — TROPONIN I

## 2013-09-13 MED ORDER — CHLORPROMAZINE HCL 25 MG PO TABS: 25.0000 mg | ORAL_TABLET | Freq: Three times a day (TID) | ORAL | Status: DC | PRN

## 2013-09-13 MED ORDER — SODIUM CHLORIDE 0.9 % IV SOLN
25.0000 mg | Freq: Once | INTRAVENOUS | Status: AC
  Administered 2013-09-13: 25 mg via INTRAVENOUS
  Filled 2013-09-13: qty 1

## 2013-09-13 NOTE — ED Notes (Signed)
Pt reports relief from hiccups after medication administered but reports hiccups starting back up at present time. EDP, Pollina aware verbalized will send pt home with medication to treat hiccups.

## 2013-09-13 NOTE — Discharge Instructions (Signed)
Hiccups Hiccups are caused by a sudden contraction of the muscles between the ribs and the muscle under your lungs (diaphragm). When you hiccup, the top of your windpipe (glottis) closes immediately after your diaphragm contracts. This makes the typical 'hic' sound. A hiccup is a reflex that you cannot stop. Unlike other reflexes such as coughing and sneezing, hiccups do not seem to have any useful purpose. There are 3 types of hiccups:   Benign bouts: last less than 48 hours.  Persistent: last more than 48 hours but less than 1 month.  Intractable: last more than 1 month. CAUSES  Most people have bouts of hiccups from time to time. They start for no apparent reason, last a short while, and then stop. Sometimes they are due to:  A temporary swollen stomach caused by overeating or eating too fast, eating spicy foods, drinking fizzy drinks, or swallowing air.  A sudden change in temperature (very hot or cold foods or drinks, a cold shower).  Drinking alcohol or using tobacco. There are no particular tests used to diagnose hiccups. Hiccups are usually considered harmless and do not point to a serious medical condition. However, there can be underlying medical problems that may cause hiccups, such as pneumonia, diabetes, metabolic problems, tumors, abdominal infections or injuries, and neurologic problems.You must follow up with your caregiver if your symptoms persist or become a frequent problem. TREATMENT   Most cases need no treatment. A bout of benign hiccups usually does not last long.  Medicine is sometimes needed to stop persistent hiccups. Medicine may be given intravenously (IV) or by mouth.  Hypnosis or acupuncture may be suggested.  Surgery to affect the nerve that supplies the diaphragm may be tried in severe cases.  Treatment of an underlying cause is needed in some cases. HOME CARE INSTRUCTIONS  Popular remedies that may stop a bout of hiccups include:  Gargling ice  water.  Swallowing granulated sugar.  Biting on a lemon.  Holding your breath, breathing fast, or breathing into a paper bag.  Bearing down.  Gasping after a sudden fright.  Pulling your tongue gently.  Distraction. SEEK MEDICAL CARE IF:   Hiccups last for more than 48 hours.  You are given medicine but your hiccups do not get better.  New symptoms show up.  You cannot sleep or eat due to the hiccups.  You have unexpected weight loss.  You have trouble breathing or swallowing.  You develop severe pain in your abdomen or other areas.  You develop numbness, tingling, or weakness. Document Released: 10/21/2001 Document Revised: 11/04/2011 Document Reviewed: 10/03/2010 [facility] Patient Information 2014 [place], [place].

## 2013-09-13 NOTE — ED Provider Notes (Signed)
CSN: [id number]     Arrival date & time 09/13/13  0620 History   First MD Initiated Contact with Patient 09/13/13 [phone]     Chief Complaint  Patient presents with  . Hiccups   (Consider location/radiation/quality/duration/timing/severity/associated sxs/prior Treatment) HPI Comments: Patient presents to the ER for evaluation of hiccups. Patient reports that the symptoms began 3 days ago, but he has had a history of chronic hiccups, never completely go away. For the last 3 days he has had worse symptoms. He has not been experiencing any chest pain. He does report a cough, but this is chronic from asbestos exposure. He is not experiencing any abdominal pain.   Past Medical History  Diagnosis Date  . Coronary artery disease     a. Low level exercise Lex MV 3/14: low risk, EF 62%, inf defect 2/2 diaph attenuation vs artifiact, small area of scar possible, no ischemia  . Hyperlipidemia   . Hypertension   . History of seizure disorder   . History of atrial flutter   . Depression   . Back pain     persistent  . Hearing loss   . Diverticulitis   . H/O alcohol abuse   . Diabetes mellitus     controled by diet  . GERD (gastroesophageal reflux disease)   . Seizures   . Headache(784.0)   . Arthritis     of spine  . Sleep apnea     does not use CPAP  . Hx of echocardiogram     Echo 7/14:  Mod LVH, EF 55-60%, Gr 1 DD, mild MR, PASP 36  . AKI (acute kidney injury)     a. 6/14: resolved after d/c ARB;  b. RA U/S 7/14: no RA stenosis   Past Surgical History  Procedure Laterality Date  . Partial colectomy      for diverticuli  . Orchiectomy    . Umbilical hernia repair    . Cervical fusion    . Coronary artery bypass graft  2005    LIMA graft to LAD,saphenous vein graft to diag.,circumflex, marginal,and to the RCA  . Cardiac catheterization    . Appendectomy    . Esophagogastroduodenoscopy  02/10/2012    Procedure: ESOPHAGOGASTRODUODENOSCOPY (EGD);  Surgeon: Cleotis Nipper, MD;   Location: [facility] ENDOSCOPY;  Service: Endoscopy;  Laterality: N/A;  . Foreign body removal  02/10/2012    Procedure: FOREIGN BODY REMOVAL;  Surgeon: Cleotis Nipper, MD;  Location: [place];  Service: Endoscopy;  Laterality: N/A;  . Back surgery    . Esophagogastroduodenoscopy  06/29/2012    Procedure: ESOPHAGOGASTRODUODENOSCOPY (EGD);  Surgeon: Winfield Cunas., MD;  Location: [facility] ENDOSCOPY;  Service: Endoscopy;  Laterality: N/A;  . Foreign body removal  06/29/2012    Procedure: FOREIGN BODY REMOVAL;  Surgeon: Winfield Cunas., MD;  Location: [facility] ENDOSCOPY;  Service: Endoscopy;  Laterality: N/A;  . Savory dilation  06/29/2012    Procedure: SAVORY DILATION;  Surgeon: Winfield Cunas., MD;  Location: [facility] ENDOSCOPY;  Service: Endoscopy;  Laterality: N/A;   Family History  Problem Relation Age of Onset  . Emphysema Mother     was a smoker  . Asthma Mother   . Heart disease Father   . Prostate cancer Paternal Grandfather   . Pancreatic cancer Paternal Uncle    History  Substance Use Topics  . Smoking status: Never Smoker   . Smokeless tobacco: Never Used  . Alcohol Use: No     Comment: rare  beer    Review of Systems  Constitutional: Negative for fever.  Respiratory: Positive for cough.   Cardiovascular: Negative for chest pain.  Gastrointestinal: Positive for vomiting (occasional with hiccup) and diarrhea (chronic).  All other systems reviewed and are negative.    Allergies  Cephalexin; Doxycycline; Lac bovis; and Pentazocine lactate  Home Medications   Current Outpatient Rx  Name  Route  Sig  Dispense  Refill  . acetaminophen-codeine (TYLENOL #3) 300-30 MG per tablet      TAKE 1 TABLET BY MOUTH EVERY 4 HOURS UNTIL COUGH GONE   30 tablet   0   . aspirin 325 MG tablet   Oral   Take 325 mg by mouth daily.         Marland Kitchen atorvastatin (LIPITOR) 80 MG tablet      TAKE 1 TABLET BY MOUTH EVERY NIGHT AT BEDTIME   30 tablet   3   . DULoxetine (CYMBALTA) 60 MG capsule    Oral   Take 60 mg by mouth daily.         . furosemide (LASIX) 20 MG tablet   Oral   Take 40 mg by mouth daily.          Marland Kitchen loperamide (IMODIUM) 2 MG capsule      Per bottle as needed         . losartan (COZAAR) 100 MG tablet   Oral   Take 50 mg by mouth daily.         . methocarbamol (ROBAXIN) 750 MG tablet   Oral   Take 750 mg by mouth 3 (three) times daily.         . metoCLOPramide (REGLAN) 5 MG tablet      1 tablet before each meal and at bedtime         . metroNIDAZOLE (FLAGYL) 500 MG tablet      1 tablet every 3 days         . nebivolol (BYSTOLIC) 10 MG tablet   Oral   Take 20 mg by mouth 2 (two) times daily.         . nitroGLYCERIN (NITROSTAT) 0.4 MG SL tablet   Sublingual   Place 1 tablet (0.4 mg total) under the tongue every 5 (five) minutes as needed. For chest pain   25 tablet   6   . SITAGLIPTIN PHOSPHATE PO   Oral   Take 1 tablet by mouth daily.         Marland Kitchen testosterone cypionate (DEPOTESTOTERONE CYPIONATE) 200 MG/ML injection   Intramuscular   Inject into the muscle every 14 (fourteen) days.         . traZODone (DESYREL) 150 MG tablet   Oral   Take 150 mg by mouth at bedtime.          BP 139/80  Pulse 72  Temp(Src) 97.7 F (36.5 C) (Oral)  Resp 20  Wt 212 lb (96.163 kg)  SpO2 96% Physical Exam  Constitutional: He is oriented to person, place, and time. He appears well-developed and well-nourished. No distress.  HENT:  Head: Normocephalic and atraumatic.  Right Ear: Hearing normal.  Left Ear: Hearing normal.  Nose: Nose normal.  Mouth/Throat: Oropharynx is clear and moist and mucous membranes are normal.  Eyes: Conjunctivae and EOM are normal. Pupils are equal, round, and reactive to light.  Neck: Normal range of motion. Neck supple.  Cardiovascular: Regular rhythm, S1 normal and S2 normal.  Exam reveals no gallop and no  friction rub.   No murmur heard. Pulmonary/Chest: Effort normal and breath sounds normal. No  respiratory distress. He exhibits no tenderness.  Abdominal: Soft. Normal appearance and bowel sounds are normal. There is no hepatosplenomegaly. There is no tenderness. There is no rebound, no guarding, no tenderness at McBurney's point and negative Murphy's sign. No hernia.  Musculoskeletal: Normal range of motion.  Neurological: He is alert and oriented to person, place, and time. He has normal strength. No cranial nerve deficit or sensory deficit. Coordination normal. GCS eye subscore is 4. GCS verbal subscore is 5. GCS motor subscore is 6.  Skin: Skin is warm, dry and intact. No rash noted. No cyanosis.  Psychiatric: He has a normal mood and affect. His speech is normal and behavior is normal. Thought content normal.    ED Course  Procedures (including critical care time) Labs Review Labs Reviewed  GLUCOSE, CAPILLARY - Abnormal; Notable for the following:    Glucose-Capillary 110 (*)    All other components within normal limits   Imaging Review No results found.  EKG Interpretation    Date/Time:  Monday September 13 2013 06:45:14 EST Ventricular Rate:  77 PR Interval:  157 QRS Duration: 96 QT Interval:  391 QTC Calculation: 442 R Axis:   64 Text Interpretation:  Sinus rhythm Left atrial enlargement Low voltage, precordial leads Baseline wander in lead(s) V1 V2 V3 V5 No significant change since last tracing Confirmed by POLLINA  MD, CHRISTOPHER ([id number]) on 09/13/2013 7:04:49 AM            MDM   1. Hiccups    Patient presents to the ER for evaluation of hiccups. This has been a chronic intermittent problem for him. He did not have any associated symptoms that were worrisome. He has no chest pain. His cardiac workup was negative. Remainder of the workup was also unremarkable. Patient had improvement with Thorazine.  He reports that he has had some dysphagia recently. The last time he had hiccups of this length, he required esophageal stretching. Patient was counseled that he  needs to be careful about what E., cut his food up small, chew thoroughly and followup with GI as soon as possible. He was given a limited supply of Thorazine to be used as needed for hiccups as an outpatient. Patient given return instructions.    Orpah Greek, MD 09/14/13 [phone]

## 2013-09-13 NOTE — ED Notes (Signed)
Pt complains of the hiccups since Friday

## 2013-09-15 DIAGNOSIS — E291 Testicular hypofunction: Secondary | ICD-10-CM | POA: Diagnosis not present

## 2013-09-15 DIAGNOSIS — N529 Male erectile dysfunction, unspecified: Secondary | ICD-10-CM | POA: Diagnosis not present

## 2013-09-18 ENCOUNTER — Ambulatory Visit (INDEPENDENT_AMBULATORY_CARE_PROVIDER_SITE_OTHER): Payer: Medicare Other | Admitting: *Deleted

## 2013-09-18 DIAGNOSIS — Z23 Encounter for immunization: Secondary | ICD-10-CM

## 2013-09-19 ENCOUNTER — Ambulatory Visit (INDEPENDENT_AMBULATORY_CARE_PROVIDER_SITE_OTHER): Payer: Medicare Other | Admitting: Internal Medicine

## 2013-09-19 VITALS — BP 100/60 | HR 109 | Temp 99.6°F | Resp 18 | Ht 66.0 in | Wt 211.0 lb

## 2013-09-19 DIAGNOSIS — R05 Cough: Secondary | ICD-10-CM

## 2013-09-19 DIAGNOSIS — R059 Cough, unspecified: Secondary | ICD-10-CM | POA: Diagnosis not present

## 2013-09-19 DIAGNOSIS — R509 Fever, unspecified: Secondary | ICD-10-CM | POA: Diagnosis not present

## 2013-09-19 DIAGNOSIS — J09X2 Influenza due to identified novel influenza A virus with other respiratory manifestations: Secondary | ICD-10-CM

## 2013-09-19 LAB — POCT INFLUENZA A/B
INFLUENZA B, POC: NEGATIVE
Influenza A, POC: POSITIVE

## 2013-09-19 MED ORDER — HYDROCODONE-HOMATROPINE 5-1.5 MG/5ML PO SYRP: 5.0000 mL | ORAL_SOLUTION | Freq: Four times a day (QID) | ORAL | Status: DC | PRN

## 2013-09-19 MED ORDER — OSELTAMIVIR PHOSPHATE 75 MG PO CAPS
75.0000 mg | ORAL_CAPSULE | Freq: Two times a day (BID) | ORAL | Status: DC
Start: 2013-09-19 — End: 2013-10-29

## 2013-09-19 NOTE — Progress Notes (Signed)
   Subjective:    Patient ID: [name], male    DOB: 1946-05-06, 68 y.o.   MRN: [id number]  HPI Complaining of a progressive three-day history leading to fever last night with harsh but nonproductive cough positive rhinorrhea and aching all over Had a flu shot in August and again yesterday No shortness of breath Mild nausea but no vomiting or diarrhea No rash Minimal sore throat   Patient Active Problem List   Diagnosis Date Noted  . Cough 06/09/2013  . Pleural plaque consistent with asbestos exposure by CT 10/2012 02/17/2013  . Renal failure 02/13/2013  . Dyspnea 02/11/2013  . Dysphagia, unspecified 05/12/2012  . Seizure disorder 04/04/2012  . CAD (coronary artery disease) 06/13/2011  . HTN (hypertension) 06/13/2011  . Diabetes mellitus 06/13/2011  . History of seizure disorder   . History of atrial flutter   . Depression   . Back pain   . Hearing loss   . Arrhythmia   . Diverticulitis   . H/O alcohol abuse    no change in meds.   Review of Systems Noncontributory    Objective:   Physical Exam  BP 100/60  Pulse 109  Temp(Src) 99.6 F (37.6 C) (Oral)  Resp 18  Ht 5' 6"  (1.676 m)  Wt 211 lb (95.709 kg)  BMI 34.07 kg/m2  SpO2 91% No acute distress but diaphoretic from aspirin 2 hours ago  Conjunctiva clear TMs clear nares clear- throat slightly inflamed without exudate No nodes  Chest clear Results for orders placed in visit on 09/19/13  POCT INFLUENZA A/B      Result Value Range   Influenza A, POC Positive     Influenza B, POC Negative         Assessment & Plan:  Cough Fever influ A Meds ordered this encounter  Medications  . oseltamivir (TAMIFLU) 75 MG capsule    Sig: Take 1 capsule (75 mg total) by mouth 2 (two) times daily.    Dispense:  10 capsule    Refill:  0  . HYDROcodone-homatropine (HYCODAN) 5-1.5 MG/5ML syrup    Sig: Take 5 mLs by mouth every 6 (six) hours as needed for cough.    Dispense:  120 mL    Refill:  0   tami for wife  prophy

## 2013-09-30 ENCOUNTER — Other Ambulatory Visit: Payer: Self-pay | Admitting: Physician Assistant

## 2013-10-06 DIAGNOSIS — K222 Esophageal obstruction: Secondary | ICD-10-CM | POA: Diagnosis not present

## 2013-10-06 DIAGNOSIS — R1310 Dysphagia, unspecified: Secondary | ICD-10-CM | POA: Diagnosis not present

## 2013-10-13 ENCOUNTER — Other Ambulatory Visit: Payer: Self-pay | Admitting: Physician Assistant

## 2013-10-13 DIAGNOSIS — M2390 Unspecified internal derangement of unspecified knee: Secondary | ICD-10-CM | POA: Diagnosis not present

## 2013-10-13 DIAGNOSIS — M2240 Chondromalacia patellae, unspecified knee: Secondary | ICD-10-CM | POA: Diagnosis not present

## 2013-10-13 DIAGNOSIS — M1710 Unilateral primary osteoarthritis, unspecified knee: Secondary | ICD-10-CM | POA: Diagnosis not present

## 2013-10-20 DIAGNOSIS — M1710 Unilateral primary osteoarthritis, unspecified knee: Secondary | ICD-10-CM | POA: Diagnosis not present

## 2013-10-27 DIAGNOSIS — M2240 Chondromalacia patellae, unspecified knee: Secondary | ICD-10-CM | POA: Diagnosis not present

## 2013-10-27 DIAGNOSIS — M1710 Unilateral primary osteoarthritis, unspecified knee: Secondary | ICD-10-CM | POA: Diagnosis not present

## 2013-10-27 DIAGNOSIS — M25569 Pain in unspecified knee: Secondary | ICD-10-CM | POA: Diagnosis not present

## 2013-10-27 DIAGNOSIS — M2390 Unspecified internal derangement of unspecified knee: Secondary | ICD-10-CM | POA: Diagnosis not present

## 2013-10-29 ENCOUNTER — Ambulatory Visit (INDEPENDENT_AMBULATORY_CARE_PROVIDER_SITE_OTHER): Payer: Medicare Other | Admitting: Family Medicine

## 2013-10-29 VITALS — BP 104/60 | HR 90 | Temp 98.3°F | Resp 16 | Ht 66.25 in | Wt 213.0 lb

## 2013-10-29 DIAGNOSIS — E7800 Pure hypercholesterolemia, unspecified: Secondary | ICD-10-CM | POA: Diagnosis not present

## 2013-10-29 DIAGNOSIS — M1990 Unspecified osteoarthritis, unspecified site: Secondary | ICD-10-CM

## 2013-10-29 DIAGNOSIS — H6120 Impacted cerumen, unspecified ear: Secondary | ICD-10-CM

## 2013-10-29 DIAGNOSIS — E781 Pure hyperglyceridemia: Secondary | ICD-10-CM

## 2013-10-29 LAB — LIPID PANEL
CHOL/HDL RATIO: 5.8 ratio
Cholesterol: 209 mg/dL — ABNORMAL HIGH (ref 0–200)
HDL: 36 mg/dL — ABNORMAL LOW (ref >39)
Triglycerides: 441 mg/dL — ABNORMAL HIGH (ref <150)

## 2013-10-29 LAB — CBC
HCT: 46.4 % (ref 39.0–52.0)
HEMOGLOBIN: 15.9 g/dL (ref 13.0–17.0)
MCH: 32.2 pg (ref 26.0–34.0)
MCHC: 34.3 g/dL (ref 30.0–36.0)
MCV: 93.9 fL (ref 78.0–100.0)
PLATELETS: 234 10*3/uL (ref 150–400)
RBC: 4.94 MIL/uL (ref 4.22–5.81)
RDW: 16.4 % — ABNORMAL HIGH (ref 11.5–15.5)
WBC: 12.4 10*3/uL — ABNORMAL HIGH (ref 4.0–10.5)

## 2013-10-29 MED ORDER — ACETAMINOPHEN-CODEINE #3 300-30 MG PO TABS: ORAL_TABLET | ORAL | Status: DC

## 2013-10-29 MED ORDER — ATORVASTATIN CALCIUM 80 MG PO TABS: 80.0000 mg | ORAL_TABLET | Freq: Every day | ORAL | Status: DC

## 2013-10-29 NOTE — Progress Notes (Addendum)
[facility] [address], [place] [address] [phone]  Date:  10/29/2013   Name:  [name]   DOB:  [birth year]   MRN:  [id number]  PCP:  [name], MD    Chief Complaint: ears stopped up   History of Present Illness:  [name] is a 68 y.o. very pleasant male patient who presents with the following:  He went to the [place] earlier today and was told that his ears were "completly stopped up" and he could not be evaluated for new hearing aids until they are cleared.  He has not been bothered except "I cant' hear, anyway."  No ear pain   He also wants to discuss his arthritis pain.  He would like to perhaps try some celebrex which he has used in the past for arthritis- most in his hands and back.  He was given this by Dr. [place] a couple of years ago and it really helped. However looking back in his chart per Dr. Doug Sou subsequent note it caused "significant increase in his blood pressure so he quit taking it."   He was recently given tylenol #3 for a cough and he found this also did help with his arthritis pain.    BP here in January was 100/60  He has a complex medical history as below.    Patient Active Problem List   Diagnosis Date Noted  . Cough 06/09/2013  . Pleural plaque consistent with asbestos exposure by CT 10/2012 02/17/2013  . Renal failure 02/13/2013  . Dyspnea 02/11/2013  . Dysphagia, unspecified 05/12/2012  . Seizure disorder 04/04/2012  . CAD (coronary artery disease) 06/13/2011  . HTN (hypertension) 06/13/2011  . Diabetes mellitus 06/13/2011  . History of seizure disorder   . History of atrial flutter   . Depression   . Back pain   . Hearing loss   . Arrhythmia   . Diverticulitis   . H/O alcohol abuse     Past Medical History  Diagnosis Date  . Coronary artery disease     a. Low level exercise Lex MV 3/14: low risk, EF 62%, inf defect 2/2 diaph attenuation vs artifiact, small area of scar possible, no ischemia  .  Hyperlipidemia   . Hypertension   . History of seizure disorder   . History of atrial flutter   . Depression   . Back pain     persistent  . Hearing loss   . Diverticulitis   . H/O alcohol abuse   . Diabetes mellitus     controled by diet  . GERD (gastroesophageal reflux disease)   . Seizures   . Headache(784.0)   . Arthritis     of spine  . Sleep apnea     does not use CPAP  . Hx of echocardiogram     Echo 7/14:  Mod LVH, EF 55-60%, Gr 1 DD, mild MR, PASP 36  . AKI (acute kidney injury)     a. 6/14: resolved after d/c ARB;  b. RA U/S 7/14: no RA stenosis    Past Surgical History  Procedure Laterality Date  . Partial colectomy      for diverticuli  . Orchiectomy    . Umbilical hernia repair    . Cervical fusion    . Coronary artery bypass graft  2005    LIMA graft to LAD,saphenous vein graft to diag.,circumflex, marginal,and to the RCA  . Cardiac catheterization    . Appendectomy    .  Esophagogastroduodenoscopy  02/10/2012    Procedure: ESOPHAGOGASTRODUODENOSCOPY (EGD);  Surgeon: Cleotis Nipper, MD;  Location: [facility] ENDOSCOPY;  Service: Endoscopy;  Laterality: N/A;  . Foreign body removal  02/10/2012    Procedure: FOREIGN BODY REMOVAL;  Surgeon: Cleotis Nipper, MD;  Location: [place];  Service: Endoscopy;  Laterality: N/A;  . Back surgery    . Esophagogastroduodenoscopy  06/29/2012    Procedure: ESOPHAGOGASTRODUODENOSCOPY (EGD);  Surgeon: Winfield Cunas., MD;  Location: [facility] ENDOSCOPY;  Service: Endoscopy;  Laterality: N/A;  . Foreign body removal  06/29/2012    Procedure: FOREIGN BODY REMOVAL;  Surgeon: Winfield Cunas., MD;  Location: [facility] ENDOSCOPY;  Service: Endoscopy;  Laterality: N/A;  . Savory dilation  06/29/2012    Procedure: SAVORY DILATION;  Surgeon: Winfield Cunas., MD;  Location: [facility] ENDOSCOPY;  Service: Endoscopy;  Laterality: N/A;    History  Substance Use Topics  . Smoking status: Never Smoker   . Smokeless tobacco: Never Used  . Alcohol  Use: No     Comment: rare beer    Family History  Problem Relation Age of Onset  . Emphysema Mother     was a smoker  . Asthma Mother   . Heart disease Father   . Prostate cancer Paternal Grandfather   . Pancreatic cancer Paternal Uncle     Allergies  Allergen Reactions  . Cephalexin     Unknown  . Doxycycline     Unknown  . Lac Bovis Other (See Comments)    lactose intolerant  . Pentazocine Lactate     Unknown    Medication list has been reviewed and updated.  Current Outpatient Prescriptions on File Prior to Visit  Medication Sig Dispense Refill  . acetaminophen-codeine (TYLENOL #3) 300-30 MG per tablet TAKE 1 TABLET BY MOUTH EVERY 4 HOURS UNTIL COUGH GONE  30 tablet  0  . aspirin 325 MG tablet Take 325 mg by mouth daily.      Marland Kitchen atorvastatin (LIPITOR) 80 MG tablet Take 80 mg by mouth at bedtime.      . chlorproMAZINE (THORAZINE) 25 MG tablet Take 1 tablet (25 mg total) by mouth 3 (three) times daily as needed for hiccoughs.  20 tablet  0  . DULoxetine (CYMBALTA) 60 MG capsule Take 60 mg by mouth daily.      . furosemide (LASIX) 20 MG tablet Take 40 mg by mouth daily.       . furosemide (LASIX) 20 MG tablet TAKE 1 TABLET BY MOUTH EVERY DAY  30 tablet  0  . loperamide (IMODIUM) 2 MG capsule Per bottle as needed      . losartan (COZAAR) 100 MG tablet Take 50 mg by mouth daily.      . methocarbamol (ROBAXIN) 750 MG tablet Take 750 mg by mouth 3 (three) times daily.      . metoCLOPramide (REGLAN) 5 MG tablet 1 tablet before each meal and at bedtime      . metroNIDAZOLE (FLAGYL) 500 MG tablet 1 tablet every 3 days      . nebivolol (BYSTOLIC) 10 MG tablet Take 20 mg by mouth 2 (two) times daily.      . nitroGLYCERIN (NITROSTAT) 0.4 MG SL tablet Place 1 tablet (0.4 mg total) under the tongue every 5 (five) minutes as needed. For chest pain  25 tablet  6  . omeprazole (PRILOSEC) 20 MG capsule Take 20 mg by mouth 2 (two) times daily before a meal.      .  SITAGLIPTIN PHOSPHATE PO  Take 1 tablet by mouth daily.      Marland Kitchen testosterone cypionate (DEPOTESTOTERONE CYPIONATE) 200 MG/ML injection Inject into the muscle every 14 (fourteen) days.      . traZODone (DESYREL) 150 MG tablet Take 150 mg by mouth at bedtime.      Marland Kitchen atorvastatin (LIPITOR) 80 MG tablet Take 1 tablet (80 mg total) by mouth daily. PATIENT NEEDS OFFICE VISIT FOR ADDITIONAL REFILLS  30 tablet  0  . HYDROcodone-homatropine (HYCODAN) 5-1.5 MG/5ML syrup Take 5 mLs by mouth every 6 (six) hours as needed for cough.  120 mL  0  . oseltamivir (TAMIFLU) 75 MG capsule Take 1 capsule (75 mg total) by mouth 2 (two) times daily.  10 capsule  0   No current facility-administered medications on file prior to visit.    Review of Systems:  As per HPI- otherwise negative.   Physical Examination: Filed Vitals:   10/29/13 1527  BP: 104/60  Pulse: 90  Temp: 98.3 F (36.8 C)  Resp: 16   Filed Vitals:   10/29/13 1527  Height: 5' 6.25" (1.683 m)  Weight: 213 lb (96.616 kg)   Body mass index is 34.11 kg/(m^2). Ideal Body Weight: Weight in (lb) to have BMI = 25: 155.7  GEN: WDWN, NAD, Non-toxic, A & O x 3, overweight, looks well HEENT: Atraumatic, Normocephalic. Neck supple. No masses, No LAD.  Cerumen in bilateral ears, PEERL, EOMI, oropharynx wnl.   Ears and Nose: No external deformity. CV: RRR, No M/G/R. No JVD. No thrill. No extra heart sounds. PULM: CTA B, no wheezes, crackles, rhonchi. No retractions. No resp. distress. No accessory muscle use. EXTR: No c/c/e NEURO Normal gait.  PSYCH: Normally interactive. Conversant. Not depressed or anxious appearing.  Calm demeanor.  Removed cerumen from both ears with curette.  Normal ear canal and TM bilaterally  Assessment and Plan: High cholesterol - Plan: atorvastatin (LIPITOR) 80 MG tablet, Lipid panel, CBC  Cerumen impaction  Arthritis pain - Plan: acetaminophen-codeine (TYLENOL #3) 300-30 MG per tablet, DISCONTINUED: acetaminophen-codeine (TYLENOL #3) 300-30  MG per tablet  Resolved cerumen impaction as above.  He is due for labs as above.  Discussed celebrex use.  His antihypertensives have changed since he last used celebrex and there are now a lot of potential drug interactions with celebrex.  May be best to try and avoid this medication.  He had good results with tylenol #3 in the past; he can continue to use this for more severe arthritis pain.  He has used it with trazadone, thorazine and robaxin in the past without ill effect  Meds ordered this encounter  Medications  . atorvastatin (LIPITOR) 80 MG tablet    Sig: Take 1 tablet (80 mg total) by mouth daily.    Dispense:  30 tablet    Refill:  11  . DISCONTD: acetaminophen-codeine (TYLENOL #3) 300-30 MG per tablet    Sig: TAKE 1 TABLET BY MOUTH EVERY 8 HOURS as needed for pain    Dispense:  49 tablet    Refill:  0  . acetaminophen-codeine (TYLENOL #3) 300-30 MG per tablet    Sig: TAKE 1 TABLET BY MOUTH EVERY 8 hours as needed for pain    Dispense:  40 tablet    Refill:  0     Signed Lamar Blinks, MD  Received his labs as below: Results for orders placed in visit on 10/29/13  LIPID PANEL      Result Value Ref Range  Cholesterol 209 (*) 0 - 200 mg/dL   Triglycerides 441 (*) <150 mg/dL   HDL 36 (*) >39 mg/dL   Total CHOL/HDL Ratio 5.8     VLDL NOT CALC  0 - 40 mg/dL   LDL Cholesterol NOT CALC  0 - 99 mg/dL  CBC      Result Value Ref Range   WBC 12.4 (*) 4.0 - 10.5 K/uL   RBC 4.94  4.22 - 5.81 MIL/uL   Hemoglobin 15.9  13.0 - 17.0 g/dL   HCT 46.4  39.0 - 52.0 %   MCV 93.9  78.0 - 100.0 fL   MCH 32.2  26.0 - 34.0 pg   MCHC 34.3  30.0 - 36.0 g/dL   RDW 16.4 (*) 11.5 - 15.5 %   Platelets 234  150 - 400 K/uL   Unfortunately his triglycerides are still very high.  He does have CAD so we would like to optimize this.  He states he is complinat with his lipitor.

## 2013-11-03 NOTE — Addendum Note (Signed)
Addended by: Lamar Blinks C on: 11/03/2013 05:49 PM   Modules accepted: Orders

## 2013-11-04 ENCOUNTER — Ambulatory Visit (INDEPENDENT_AMBULATORY_CARE_PROVIDER_SITE_OTHER): Payer: Medicare Other | Admitting: Family Medicine

## 2013-11-04 ENCOUNTER — Encounter: Payer: Self-pay | Admitting: Family Medicine

## 2013-11-04 VITALS — BP 110/82 | HR 83 | Temp 97.4°F | Resp 16 | Ht 64.75 in | Wt 214.0 lb

## 2013-11-04 DIAGNOSIS — M129 Arthropathy, unspecified: Secondary | ICD-10-CM

## 2013-11-04 DIAGNOSIS — L988 Other specified disorders of the skin and subcutaneous tissue: Secondary | ICD-10-CM | POA: Diagnosis not present

## 2013-11-04 DIAGNOSIS — R609 Edema, unspecified: Secondary | ICD-10-CM

## 2013-11-04 DIAGNOSIS — M1990 Unspecified osteoarthritis, unspecified site: Secondary | ICD-10-CM

## 2013-11-04 DIAGNOSIS — D72829 Elevated white blood cell count, unspecified: Secondary | ICD-10-CM

## 2013-11-04 DIAGNOSIS — E781 Pure hyperglyceridemia: Secondary | ICD-10-CM | POA: Diagnosis not present

## 2013-11-04 DIAGNOSIS — R234 Changes in skin texture: Secondary | ICD-10-CM

## 2013-11-04 MED ORDER — ACETAMINOPHEN-CODEINE #3 300-30 MG PO TABS: ORAL_TABLET | ORAL | Status: DC

## 2013-11-04 MED ORDER — FUROSEMIDE 20 MG PO TABS: ORAL_TABLET | ORAL | Status: DC

## 2013-11-04 NOTE — Patient Instructions (Signed)
I think the pain in your heel is due to the splint in your skin.  Keep the area covered so it can heel.  Let me know it if is not better soon You can use the tylenol #3 for pain- however remember this is meant to be used only for more severe pain- it is a narcotic and can be addictive.   It can also cause sedation.    Please come and see [place] in about one month to have your cholesterol rechecked.  In the meantime work on your diet and exercise to help improve your cholesterol.

## 2013-11-04 NOTE — Progress Notes (Signed)
[facility] [address] [place] [address] [phone]  Date:  11/04/2013   Name:  [name]   DOB:  01-04-1946   MRN:  [id number]  PCP:  [name], MD    Chief Complaint: Foot Pain   History of Present Illness:  [name] is a 68 y.o. very pleasant male patient who presents with the following:  He is here today with heel pain.  This just started last week. His right heel is worse than the left.  NKI.  He has never had this in the past.   He has the worst pain when he first gets up in the am.  He has more pain with being barefoot or being on carpet  I had given him some tylenol #3 last week for his arthritis.  He thought he was supposed to take this around the clock and has been taking it regularly, he is nearly out.  Explained that he is meant to use this only for severe pain  Also discussed his recent labs.  His triglycerides are high- he is already on a statin and is compliant with taking it.  Discussed adding a fibrate such as tricor.  I believe the benefits of this change outweigh the risk of myopathy.  However at this time he does not wish to add a medication, he would like to try diet changes first  Patient Active Problem List   Diagnosis Date Noted  . Cough 06/09/2013  . Pleural plaque consistent with asbestos exposure by CT 10/2012 02/17/2013  . Renal failure 02/13/2013  . Dyspnea 02/11/2013  . Dysphagia, unspecified 05/12/2012  . Seizure disorder 04/04/2012  . CAD (coronary artery disease) 06/13/2011  . HTN (hypertension) 06/13/2011  . Diabetes mellitus 06/13/2011  . History of seizure disorder   . History of atrial flutter   . Depression   . Back pain   . Hearing loss   . Arrhythmia   . Diverticulitis   . H/O alcohol abuse     Past Medical History  Diagnosis Date  . Coronary artery disease     a. Low level exercise Lex MV 3/14: low risk, EF 62%, inf defect 2/2 diaph attenuation vs artifiact, small area of scar possible,  no ischemia  . Hyperlipidemia   . Hypertension   . History of seizure disorder   . History of atrial flutter   . Depression   . Back pain     persistent  . Hearing loss   . Diverticulitis   . H/O alcohol abuse   . Diabetes mellitus     controled by diet  . GERD (gastroesophageal reflux disease)   . Seizures   . Headache(784.0)   . Arthritis     of spine  . Sleep apnea     does not use CPAP  . Hx of echocardiogram     Echo 7/14:  Mod LVH, EF 55-60%, Gr 1 DD, mild MR, PASP 36  . AKI (acute kidney injury)     a. 6/14: resolved after d/c ARB;  b. RA U/S 7/14: no RA stenosis    Past Surgical History  Procedure Laterality Date  . Partial colectomy      for diverticuli  . Orchiectomy    . Umbilical hernia repair    . Cervical fusion    . Coronary artery bypass graft  2005    LIMA graft to LAD,saphenous vein graft to diag.,circumflex, marginal,and to the RCA  .  Cardiac catheterization    . Appendectomy    . Esophagogastroduodenoscopy  02/10/2012    Procedure: ESOPHAGOGASTRODUODENOSCOPY (EGD);  Surgeon: Cleotis Nipper, MD;  Location: [facility] ENDOSCOPY;  Service: Endoscopy;  Laterality: N/A;  . Foreign body removal  02/10/2012    Procedure: FOREIGN BODY REMOVAL;  Surgeon: Cleotis Nipper, MD;  [facility] [facility];  Service: Endoscopy;  Laterality: N/A;  . Back surgery    . Esophagogastroduodenoscopy  06/29/2012    Procedure: ESOPHAGOGASTRODUODENOSCOPY (EGD);  Surgeon: Winfield Cunas., MD;  Location: [facility] ENDOSCOPY;  Service: Endoscopy;  Laterality: N/A;  . Foreign body removal  06/29/2012    Procedure: FOREIGN BODY REMOVAL;  Surgeon: Winfield Cunas., MD;  Location: [facility] ENDOSCOPY;  Service: Endoscopy;  Laterality: N/A;  . Savory dilation  06/29/2012    Procedure: SAVORY DILATION;  Surgeon: Winfield Cunas., MD;  Location: [facility] ENDOSCOPY;  Service: Endoscopy;  Laterality: N/A;    History  Substance Use Topics  . Smoking status: Never Smoker   . Smokeless tobacco: Never Used   . Alcohol Use: No     Comment: rare beer    Family History  Problem Relation Age of Onset  . Emphysema Mother     was a smoker  . Asthma Mother   . Heart disease Father   . Prostate cancer Paternal Grandfather   . Pancreatic cancer Paternal Uncle     Allergies  Allergen Reactions  . Cephalexin     Unknown  . Doxycycline     Unknown  . Lac Bovis Other (See Comments)    lactose intolerant  . Pentazocine Lactate     He passed out- he had a seizure.  This occurred around 2000    Medication list has been reviewed and updated.  Current Outpatient Prescriptions on File Prior to Visit  Medication Sig Dispense Refill  . acetaminophen-codeine (TYLENOL #3) 300-30 MG per tablet TAKE 1 TABLET BY MOUTH EVERY 8 hours as needed for pain  40 tablet  0  . aspirin 325 MG tablet Take 325 mg by mouth daily.      Marland Kitchen atorvastatin (LIPITOR) 80 MG tablet Take 1 tablet (80 mg total) by mouth daily.  30 tablet  11  . chlorproMAZINE (THORAZINE) 25 MG tablet Take 1 tablet (25 mg total) by mouth 3 (three) times daily as needed for hiccoughs.  20 tablet  0  . DULoxetine (CYMBALTA) 60 MG capsule Take 60 mg by mouth daily.      . furosemide (LASIX) 20 MG tablet Take 40 mg by mouth daily.       . furosemide (LASIX) 20 MG tablet TAKE 1 TABLET BY MOUTH EVERY DAY  30 tablet  0  . loperamide (IMODIUM) 2 MG capsule Per bottle as needed      . losartan (COZAAR) 100 MG tablet Take 50 mg by mouth daily.      . methocarbamol (ROBAXIN) 750 MG tablet Take 750 mg by mouth 3 (three) times daily.      . metoCLOPramide (REGLAN) 5 MG tablet 1 tablet before each meal and at bedtime      . nebivolol (BYSTOLIC) 10 MG tablet Take 20 mg by mouth 2 (two) times daily.      . nitroGLYCERIN (NITROSTAT) 0.4 MG SL tablet Place 1 tablet (0.4 mg total) under the tongue every 5 (five) minutes as needed. For chest pain  25 tablet  6  . omeprazole (PRILOSEC) 20 MG capsule Take 20 mg by mouth 2 (  two) times daily before a meal.      .  SITAGLIPTIN PHOSPHATE PO Take 1 tablet by mouth daily.      Marland Kitchen testosterone cypionate (DEPOTESTOTERONE CYPIONATE) 200 MG/ML injection Inject into the muscle every 14 (fourteen) days.      . traZODone (DESYREL) 150 MG tablet Take 150 mg by mouth at bedtime.       No current facility-administered medications on file prior to visit.    Review of Systems:  As per HPI- otherwise negative.   Physical Examination: Filed Vitals:   11/04/13 0846  BP: 110/82  Pulse: 83  Temp: 97.4 F (36.3 C)  Resp: 16   Filed Vitals:   11/04/13 0846  Height: 5' 4.75" (1.645 m)  Weight: 214 lb (97.07 kg)   Body mass index is 35.87 kg/(m^2). Ideal Body Weight: Weight in (lb) to have BMI = 25: 148.8  GEN: WDWN, NAD, Non-toxic, A & O x 3, obese, looks well HEENT: Atraumatic, Normocephalic. Neck supple. No masses, No LAD. Ears and Nose: No external deformity. CV: RRR, No M/G/R. No JVD. No thrill. No extra heart sounds. PULM: CTA B, no wheezes, crackles, rhonchi. No retractions. No resp. distress. No accessory muscle use. EXTR: No c/c/e NEURO Normal gait.  PSYCH: Normally interactive. Conversant. Not depressed or anxious appearing.  Calm demeanor.  Right heel: there is a deep skin crack that is tender.  No evidence of infection.  No tenderness sin the rest of the foot or ankle, no evidence of plantar fascia pain. Prepped heel with alcohol and peroxide.  Pared some dead skin from around skin crack and closed with steri strips.    Assessment and Plan: High triglycerides - Plan: Lipid panel  Edema - Plan: furosemide (LASIX) 20 MG tablet  Arthritis pain - Plan: acetaminophen-codeine (TYLENOL #3) 300-30 MG per tablet  Leukocytosis, unspecified - Plan: CBC  Cracked skin on feet  Skin crack in heel treated as above.  Encouraged him to continue to keep wound protected until healed.  High triglycerides and mild leukocytosis at last labs; he will come in for a repeat in 1 months Explained that tylenol #3  is for severe pain only- he is not to take it around the clock. He states understanding.   Signed Lamar Blinks, MD

## 2013-11-08 DIAGNOSIS — K222 Esophageal obstruction: Secondary | ICD-10-CM | POA: Diagnosis not present

## 2013-11-08 DIAGNOSIS — K912 Postsurgical malabsorption, not elsewhere classified: Secondary | ICD-10-CM | POA: Diagnosis not present

## 2013-11-08 DIAGNOSIS — K219 Gastro-esophageal reflux disease without esophagitis: Secondary | ICD-10-CM | POA: Diagnosis not present

## 2013-11-08 DIAGNOSIS — R066 Hiccough: Secondary | ICD-10-CM | POA: Diagnosis not present

## 2013-11-11 ENCOUNTER — Telehealth: Payer: Self-pay

## 2013-11-11 DIAGNOSIS — R0683 Snoring: Secondary | ICD-10-CM

## 2013-11-11 NOTE — Telephone Encounter (Signed)
Patient would like a referral for a sleep study please call him at [phone] with questions

## 2013-11-14 ENCOUNTER — Emergency Department (HOSPITAL_COMMUNITY): Payer: Medicare Other

## 2013-11-14 ENCOUNTER — Encounter (HOSPITAL_COMMUNITY): Payer: Self-pay | Admitting: Emergency Medicine

## 2013-11-14 ENCOUNTER — Inpatient Hospital Stay (HOSPITAL_COMMUNITY)
Admission: EM | Admit: 2013-11-14 | Discharge: 2013-11-18 | DRG: 041 | Disposition: A | Discharge status: Home Health | Payer: Medicare Other | Attending: Family Medicine | Admitting: Family Medicine

## 2013-11-14 DIAGNOSIS — G40909 Epilepsy, unspecified, not intractable, without status epilepticus: Secondary | ICD-10-CM | POA: Diagnosis present

## 2013-11-14 DIAGNOSIS — I6789 Other cerebrovascular disease: Secondary | ICD-10-CM | POA: Diagnosis not present

## 2013-11-14 DIAGNOSIS — Z8679 Personal history of other diseases of the circulatory system: Secondary | ICD-10-CM | POA: Diagnosis not present

## 2013-11-14 DIAGNOSIS — I6350 Cerebral infarction due to unspecified occlusion or stenosis of unspecified cerebral artery: Secondary | ICD-10-CM | POA: Diagnosis not present

## 2013-11-14 DIAGNOSIS — D72829 Elevated white blood cell count, unspecified: Secondary | ICD-10-CM | POA: Diagnosis not present

## 2013-11-14 DIAGNOSIS — I369 Nonrheumatic tricuspid valve disorder, unspecified: Secondary | ICD-10-CM | POA: Diagnosis not present

## 2013-11-14 DIAGNOSIS — IMO0002 Reserved for concepts with insufficient information to code with codable children: Secondary | ICD-10-CM | POA: Diagnosis present

## 2013-11-14 DIAGNOSIS — I10 Essential (primary) hypertension: Secondary | ICD-10-CM | POA: Diagnosis present

## 2013-11-14 DIAGNOSIS — N179 Acute kidney failure, unspecified: Secondary | ICD-10-CM | POA: Diagnosis present

## 2013-11-14 DIAGNOSIS — I499 Cardiac arrhythmia, unspecified: Secondary | ICD-10-CM | POA: Diagnosis present

## 2013-11-14 DIAGNOSIS — I4891 Unspecified atrial fibrillation: Secondary | ICD-10-CM | POA: Diagnosis not present

## 2013-11-14 DIAGNOSIS — Z79899 Other long term (current) drug therapy: Secondary | ICD-10-CM | POA: Diagnosis not present

## 2013-11-14 DIAGNOSIS — M1710 Unilateral primary osteoarthritis, unspecified knee: Secondary | ICD-10-CM | POA: Diagnosis present

## 2013-11-14 DIAGNOSIS — G4733 Obstructive sleep apnea (adult) (pediatric): Secondary | ICD-10-CM | POA: Diagnosis present

## 2013-11-14 DIAGNOSIS — Z7982 Long term (current) use of aspirin: Secondary | ICD-10-CM

## 2013-11-14 DIAGNOSIS — Z951 Presence of aortocoronary bypass graft: Secondary | ICD-10-CM | POA: Diagnosis not present

## 2013-11-14 DIAGNOSIS — Z981 Arthrodesis status: Secondary | ICD-10-CM | POA: Diagnosis not present

## 2013-11-14 DIAGNOSIS — N19 Unspecified kidney failure: Secondary | ICD-10-CM

## 2013-11-14 DIAGNOSIS — S0990XA Unspecified injury of head, initial encounter: Secondary | ICD-10-CM | POA: Diagnosis not present

## 2013-11-14 DIAGNOSIS — W19XXXA Unspecified fall, initial encounter: Secondary | ICD-10-CM

## 2013-11-14 DIAGNOSIS — S0993XA Unspecified injury of face, initial encounter: Secondary | ICD-10-CM | POA: Diagnosis not present

## 2013-11-14 DIAGNOSIS — E119 Type 2 diabetes mellitus without complications: Secondary | ICD-10-CM | POA: Diagnosis not present

## 2013-11-14 DIAGNOSIS — F329 Major depressive disorder, single episode, unspecified: Secondary | ICD-10-CM | POA: Diagnosis not present

## 2013-11-14 DIAGNOSIS — K219 Gastro-esophageal reflux disease without esophagitis: Secondary | ICD-10-CM | POA: Diagnosis present

## 2013-11-14 DIAGNOSIS — S298XXA Other specified injuries of thorax, initial encounter: Secondary | ICD-10-CM | POA: Diagnosis not present

## 2013-11-14 DIAGNOSIS — R42 Dizziness and giddiness: Secondary | ICD-10-CM | POA: Diagnosis not present

## 2013-11-14 DIAGNOSIS — I639 Cerebral infarction, unspecified: Secondary | ICD-10-CM

## 2013-11-14 DIAGNOSIS — R1319 Other dysphagia: Secondary | ICD-10-CM | POA: Diagnosis present

## 2013-11-14 DIAGNOSIS — I4892 Unspecified atrial flutter: Secondary | ICD-10-CM

## 2013-11-14 DIAGNOSIS — R1310 Dysphagia, unspecified: Secondary | ICD-10-CM | POA: Diagnosis present

## 2013-11-14 DIAGNOSIS — Z794 Long term (current) use of insulin: Secondary | ICD-10-CM

## 2013-11-14 DIAGNOSIS — E785 Hyperlipidemia, unspecified: Secondary | ICD-10-CM | POA: Diagnosis present

## 2013-11-14 DIAGNOSIS — F3289 Other specified depressive episodes: Secondary | ICD-10-CM | POA: Diagnosis present

## 2013-11-14 DIAGNOSIS — Z8669 Personal history of other diseases of the nervous system and sense organs: Secondary | ICD-10-CM

## 2013-11-14 DIAGNOSIS — I2510 Atherosclerotic heart disease of native coronary artery without angina pectoris: Secondary | ICD-10-CM | POA: Diagnosis present

## 2013-11-14 DIAGNOSIS — R51 Headache: Secondary | ICD-10-CM | POA: Diagnosis not present

## 2013-11-14 DIAGNOSIS — M542 Cervicalgia: Secondary | ICD-10-CM | POA: Diagnosis not present

## 2013-11-14 DIAGNOSIS — Y92009 Unspecified place in unspecified non-institutional (private) residence as the place of occurrence of the external cause: Secondary | ICD-10-CM

## 2013-11-14 DIAGNOSIS — R4182 Altered mental status, unspecified: Secondary | ICD-10-CM | POA: Diagnosis not present

## 2013-11-14 DIAGNOSIS — F32A Depression, unspecified: Secondary | ICD-10-CM

## 2013-11-14 HISTORY — DX: Unspecified atrial flutter: I48.92

## 2013-11-14 HISTORY — DX: Cerebral infarction, unspecified: I63.9

## 2013-11-14 LAB — COMPREHENSIVE METABOLIC PANEL
ALT: 25 U/L (ref 0–53)
AST: 36 U/L (ref 0–37)
Albumin: 4.4 g/dL (ref 3.5–5.2)
Alkaline Phosphatase: 73 U/L (ref 39–117)
BILIRUBIN TOTAL: 0.5 mg/dL (ref 0.3–1.2)
BUN: 26 mg/dL — AB (ref 6–23)
CHLORIDE: 96 meq/L (ref 96–112)
CO2: 27 mEq/L (ref 19–32)
Calcium: 10.2 mg/dL (ref 8.4–10.5)
Creatinine, Ser: 1.14 mg/dL (ref 0.50–1.35)
GFR, EST AFRICAN AMERICAN: 74 mL/min — AB (ref >90)
GFR, EST NON AFRICAN AMERICAN: 64 mL/min — AB (ref >90)
GLUCOSE: 147 mg/dL — AB (ref 70–99)
Potassium: 4.1 mEq/L (ref 3.7–5.3)
Sodium: 139 mEq/L (ref 137–147)
Total Protein: 8.8 g/dL — ABNORMAL HIGH (ref 6.0–8.3)

## 2013-11-14 LAB — URINALYSIS, ROUTINE W REFLEX MICROSCOPIC
Bilirubin Urine: NEGATIVE
Glucose, UA: 100 mg/dL — AB
Hgb urine dipstick: NEGATIVE
Ketones, ur: NEGATIVE mg/dL
Leukocytes, UA: NEGATIVE
Nitrite: NEGATIVE
PROTEIN: NEGATIVE mg/dL
Specific Gravity, Urine: 1.015 (ref 1.005–1.030)
UROBILINOGEN UA: 0.2 mg/dL (ref 0.0–1.0)
pH: 5.5 (ref 5.0–8.0)

## 2013-11-14 LAB — ETHANOL

## 2013-11-14 LAB — CBC WITH DIFFERENTIAL/PLATELET
Basophils Absolute: 0 10*3/uL (ref 0.0–0.1)
Basophils Relative: 0 % (ref 0–1)
Eosinophils Absolute: 0.1 10*3/uL (ref 0.0–0.7)
Eosinophils Relative: 1 % (ref 0–5)
HCT: 52 % (ref 39.0–52.0)
HEMOGLOBIN: 17.5 g/dL — AB (ref 13.0–17.0)
LYMPHS PCT: 13 % (ref 12–46)
Lymphs Abs: 1.6 10*3/uL (ref 0.7–4.0)
MCH: 33.1 pg (ref 26.0–34.0)
MCHC: 33.7 g/dL (ref 30.0–36.0)
MCV: 98.3 fL (ref 78.0–100.0)
Monocytes Absolute: 1.4 10*3/uL — ABNORMAL HIGH (ref 0.1–1.0)
Monocytes Relative: 11 % (ref 3–12)
NEUTROS ABS: 9.4 10*3/uL — AB (ref 1.7–7.7)
Neutrophils Relative %: 76 % (ref 43–77)
Platelets: 272 10*3/uL (ref 150–400)
RBC: 5.29 MIL/uL (ref 4.22–5.81)
RDW: 15.2 % (ref 11.5–15.5)
WBC: 12.4 10*3/uL — ABNORMAL HIGH (ref 4.0–10.5)

## 2013-11-14 LAB — PROTIME-INR
INR: 1.01 (ref 0.00–1.49)
Prothrombin Time: 13.1 seconds (ref 11.6–15.2)

## 2013-11-14 LAB — I-STAT TROPONIN, ED: Troponin i, poc: 0 ng/mL (ref 0.00–0.08)

## 2013-11-14 LAB — RAPID URINE DRUG SCREEN, HOSP PERFORMED
AMPHETAMINES: NOT DETECTED
BENZODIAZEPINES: NOT DETECTED
Barbiturates: NOT DETECTED
Cocaine: NOT DETECTED
Opiates: POSITIVE — AB
TETRAHYDROCANNABINOL: NOT DETECTED

## 2013-11-14 LAB — GLUCOSE, CAPILLARY: Glucose-Capillary: 127 mg/dL — ABNORMAL HIGH (ref 70–99)

## 2013-11-14 LAB — APTT: aPTT: 30 seconds (ref 24–37)

## 2013-11-14 NOTE — ED Notes (Addendum)
Pt from home reports that he was going to basement, knee gave out and he fell down 6-7 steps hitting his head on the concrete floor at bottom of steps. Pt has chronic neck pain from surgery, normal 4/10, but today worse at 6/10. Pt reports today that he has dizziness today. Pt denies LOC, N/V/D or other injuries. Pt is A&O and in NAD. Pt lungs clear.

## 2013-11-14 NOTE — ED Notes (Signed)
Pt returned from MRI. PA at bedside.
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
## 2013-11-14 NOTE — ED Provider Notes (Signed)
CSN: [id number]     Arrival date & time 11/14/13  1551 History   First MD Initiated Contact with Patient 11/14/13 1613     Chief Complaint  Patient presents with  . Fall  . Head Injury  . Dizziness     (Consider location/radiation/quality/duration/timing/severity/associated sxs/prior Treatment) HPI Comments: Patient is a 68 year old male with history of coronary artery disease, hyperlipidemia, hypertension, seizure disorder, atrial flutter, depression, prior alcohol abuse, diabetes, GERD, sleep apnea who presents today after a fall. He reports the for many years he has been having problems with his left knee. Today when he was going down the stairs he knee gave out from under him. He was not feeling lightheaded or dizzy prior to the fall. He is confident that the reason he fell was because his knee gave out as this has happened in the past. He denies any recent illness, fevers, chills, nausea, vomiting, abdominal pain, urinary urgency, frequency, dysuria. His wife is in the room and reports that he has not been acting himself since last night. He reports chronic hiccups and night sweats for the past 25 years.   The history is provided by the patient. No language interpreter was used.    Past Medical History  Diagnosis Date  . Coronary artery disease     a. Low level exercise Lex MV 3/14: low risk, EF 62%, inf defect 2/2 diaph attenuation vs artifiact, small area of scar possible, no ischemia  . Hyperlipidemia   . Hypertension   . History of seizure disorder   . History of atrial flutter   . Depression   . Back pain     persistent  . Hearing loss   . Diverticulitis   . H/O alcohol abuse   . Diabetes mellitus     controled by diet  . GERD (gastroesophageal reflux disease)   . Seizures   . Headache(784.0)   . Arthritis     of spine  . Sleep apnea     does not use CPAP  . Hx of echocardiogram     Echo 7/14:  Mod LVH, EF 55-60%, Gr 1 DD, mild MR, PASP 36  . AKI (acute kidney  injury)     a. 6/14: resolved after d/c ARB;  b. RA U/S 7/14: no RA stenosis   Past Surgical History  Procedure Laterality Date  . Partial colectomy      for diverticuli  . Orchiectomy    . Umbilical hernia repair    . Cervical fusion    . Coronary artery bypass graft  2005    LIMA graft to LAD,saphenous vein graft to diag.,circumflex, marginal,and to the RCA  . Cardiac catheterization    . Appendectomy    . Esophagogastroduodenoscopy  02/10/2012    Procedure: ESOPHAGOGASTRODUODENOSCOPY (EGD);  Surgeon: Cleotis Nipper, MD;  Location: [facility] ENDOSCOPY;  Service: Endoscopy;  Laterality: N/A;  . Foreign body removal  02/10/2012    Procedure: FOREIGN BODY REMOVAL;  Surgeon: Cleotis Nipper, MD;  Location: [place];  Service: Endoscopy;  Laterality: N/A;  . Back surgery    . Esophagogastroduodenoscopy  06/29/2012    Procedure: ESOPHAGOGASTRODUODENOSCOPY (EGD);  Surgeon: Winfield Cunas., MD;  Location: [facility] ENDOSCOPY;  Service: Endoscopy;  Laterality: N/A;  . Foreign body removal  06/29/2012    Procedure: FOREIGN BODY REMOVAL;  Surgeon: Winfield Cunas., MD;  Location: [facility] ENDOSCOPY;  Service: Endoscopy;  Laterality: N/A;  . Savory dilation  06/29/2012    Procedure:  SAVORY DILATION;  Surgeon: Winfield Cunas., MD;  Location: [facility] ENDOSCOPY;  Service: Endoscopy;  Laterality: N/A;   Family History  Problem Relation Age of Onset  . Emphysema Mother     was a smoker  . Asthma Mother   . Heart disease Father   . Prostate cancer Paternal Grandfather   . Pancreatic cancer Paternal Uncle    History  Substance Use Topics  . Smoking status: Never Smoker   . Smokeless tobacco: Never Used  . Alcohol Use: No     Comment: rare beer    Review of Systems  Constitutional: Negative for fever and chills.  Respiratory: Negative for shortness of breath.   Cardiovascular: Negative for chest pain.  Gastrointestinal: Negative for nausea, vomiting and abdominal pain.  Genitourinary: Negative for  dysuria.  Musculoskeletal: Positive for arthralgias, myalgias and neck pain.  Neurological: Positive for light-headedness.  All other systems reviewed and are negative.      Allergies  Cephalexin; Doxycycline; Lac bovis; and Pentazocine lactate  Home Medications   Current Outpatient Rx  Name  Route  Sig  Dispense  Refill  . acetaminophen-codeine (TYLENOL #3) 300-30 MG per tablet      TAKE 1 TABLET BY MOUTH EVERY 8 hours as needed for pain Ok to fill on 11/10/13.   40 tablet   0   . aspirin 325 MG tablet   Oral   Take 325 mg by mouth daily.         Marland Kitchen atorvastatin (LIPITOR) 80 MG tablet   Oral   Take 1 tablet (80 mg total) by mouth daily.   30 tablet   11   . chlorproMAZINE (THORAZINE) 25 MG tablet   Oral   Take 1 tablet (25 mg total) by mouth 3 (three) times daily as needed for hiccoughs.   20 tablet   0   . DULoxetine (CYMBALTA) 60 MG capsule   Oral   Take 60 mg by mouth daily.         . furosemide (LASIX) 20 MG tablet      Take 1 or 2 daily as needed   60 tablet   5   . ibuprofen (ADVIL,MOTRIN) 200 MG tablet   Oral   Take 400 mg by mouth every 6 (six) hours as needed for moderate pain.         Marland Kitchen loperamide (IMODIUM) 2 MG capsule   Oral   Take 6 mg by mouth daily as needed for diarrhea or loose stools. Per bottle as needed         . losartan (COZAAR) 100 MG tablet   Oral   Take 50 mg by mouth daily.         . methocarbamol (ROBAXIN) 750 MG tablet   Oral   Take 750 mg by mouth 3 (three) times daily.         . metoCLOPramide (REGLAN) 5 MG tablet      1 tablet before each meal and at bedtime         . nebivolol (BYSTOLIC) 10 MG tablet   Oral   Take 20 mg by mouth 2 (two) times daily.         Marland Kitchen omeprazole (PRILOSEC) 20 MG capsule   Oral   Take 20 mg by mouth 2 (two) times daily before a meal.         . sitaGLIPtin (JANUVIA) 100 MG tablet   Oral   Take 100 mg by mouth daily.         Marland Kitchen  testosterone cypionate  (DEPOTESTOTERONE CYPIONATE) 200 MG/ML injection   Intramuscular   Inject into the muscle every 14 (fourteen) days.         . traZODone (DESYREL) 150 MG tablet   Oral   Take 150 mg by mouth at bedtime.         . nitroGLYCERIN (NITROSTAT) 0.4 MG SL tablet   Sublingual   Place 1 tablet (0.4 mg total) under the tongue every 5 (five) minutes as needed. For chest pain   25 tablet   6    BP 140/64  Pulse 78  Temp(Src) 98.1 F (36.7 C) (Oral)  Resp 19  SpO2 96% Physical Exam  Nursing note and vitals reviewed. Constitutional: He is oriented to person, place, and time. He appears well-developed and well-nourished. No distress.  HENT:  Head: Normocephalic and atraumatic.  Right Ear: External ear normal.  Left Ear: External ear normal.  Nose: Nose normal.  Eyes: Conjunctivae and EOM are normal. Pupils are equal, round, and reactive to light.  Neck: Normal range of motion. No spinous process tenderness and no muscular tenderness present. No tracheal deviation present.  Cardiovascular: Normal rate, regular rhythm and normal heart sounds.   Pulmonary/Chest: Effort normal and breath sounds normal. No stridor.  Abdominal: Soft. He exhibits no distension. There is no tenderness.  Musculoskeletal: Normal range of motion.  Neurological: He is alert and oriented to person, place, and time. He has normal strength. Coordination and gait normal.  Finger nose finger normal. Rapid alternating movements normal.  Grip strength 5/5 bilaterally No facial droop  Skin: Skin is warm and dry. He is not diaphoretic.  Psychiatric: He has a normal mood and affect. His behavior is normal.    ED Course  Procedures (including critical care time) Labs Review Labs Reviewed  CBC WITH DIFFERENTIAL - Abnormal; Notable for the following:    WBC 12.4 (*)    Hemoglobin 17.5 (*)    Neutro Abs 9.4 (*)    Monocytes Absolute 1.4 (*)    All other components within normal limits  COMPREHENSIVE METABOLIC PANEL -  Abnormal; Notable for the following:    Glucose, Bld 147 (*)    BUN 26 (*)    Total Protein 8.8 (*)    GFR calc non Af Amer 64 (*)    GFR calc Af Amer 74 (*)    All other components within normal limits  URINALYSIS, ROUTINE W REFLEX MICROSCOPIC - Abnormal; Notable for the following:    Glucose, UA 100 (*)    All other components within normal limits  URINE RAPID DRUG SCREEN (HOSP PERFORMED) - Abnormal; Notable for the following:    Opiates POSITIVE (*)    All other components within normal limits  URINE CULTURE  PROTIME-INR  APTT  ETHANOL  I-STAT TROPOININ, ED  I-STAT CHEM 8, ED  I-STAT TROPOININ, ED  Randolm Idol, ED   Imaging Review Dg Chest 2 View  11/14/2013   CLINICAL DATA:  Fall, dizziness, coronary artery disease post CABG, hypertension, diabetes  EXAM: CHEST  2 VIEW  COMPARISON:  09/13/2013  FINDINGS: Normal heart size post CABG.  Atherosclerotic calcification aorta.  Mediastinal contours and pulmonary vascularity normal.  Few scattered calcified pleural plaques bilaterally.  Lungs clear.  No pleural effusion or pneumothorax.  No acute osseous findings.  IMPRESSION: Post CABG.  Question prior asbestos exposure.  No acute abnormalities.   Electronically Signed   By: Lavonia Dana M.D.   On: 11/14/2013 18:26   Ct Head  Wo Contrast  11/14/2013   CLINICAL DATA:  Fall.  Headache and neck pain.  EXAM: CT HEAD WITHOUT CONTRAST  CT CERVICAL SPINE WITHOUT CONTRAST  TECHNIQUE: Multidetector CT imaging of the head and cervical spine was performed following the standard protocol without intravenous contrast. Multiplanar CT image reconstructions of the cervical spine were also generated.  COMPARISON:  CT HEAD W/O CM dated 04/02/2012  FINDINGS: CT HEAD FINDINGS  There is new hypoattenuation involving medial right frontal lobe cortex and [place] matter. Adjacent, separate focus of hypoattenuation slightly more laterally in the right frontal lobe is unchanged and compatible with an old infarct. A  cavum septum pellucidum et vergae is again noted. No acute intracranial hemorrhage is seen. Patchy periventricular Trettin matter hypodensities are similar to the prior study and compatible with mild to moderate chronic small vessel ischemic disease. A remote lacunar infarct is again seen in the left caudate. There is no evidence of mass, midline shift, or extra-axial fluid collection. Mild generalized cerebral atrophy is unchanged. Atherosclerotic intracranial vertebral ordering carotid siphon calcification is noted. Prior left cataract surgery is noted. The mastoid air cells and visualized paranasal sinuses are clear.  CT CERVICAL SPINE FINDINGS  There is grade 1 anterolisthesis of C3 on C4, likely due to facet seen. There is fusion of the C6 and C7 vertebral bodies. Prevertebral soft tissues are within normal limits. No acute cervical spine fracture is seen. Moderate to severe multilevel facet arthrosis is noted, left greater than right and most severe from C2-3 to C4-5. Mild-to-moderate multilevel spondylosis is present, greatest at C5-6. Pleural thickening/ scarring is noted in the right lung apex. Atherosclerotic calcification is present at the right greater than left carotid bifurcations. Old bilateral posterior first rib fractures are noted.  IMPRESSION: 1. New hypodensity in the medial right frontal lobe, concerning for acute to subacute infarct. 2. No evidence of intracranial hemorrhage. 3. No evidence of acute osseous abnormality in the cervical spine. These results were called by telephone at the time of interpretation on 11/14/2013 at 5:42 PM to Dr. Cleatrice Burke , who verbally acknowledged these results.   Electronically Signed   By: Logan Bores   On: 11/14/2013 17:43   Ct Cervical Spine Wo Contrast  11/14/2013   CLINICAL DATA:  Fall.  Headache and neck pain.  EXAM: CT HEAD WITHOUT CONTRAST  CT CERVICAL SPINE WITHOUT CONTRAST  TECHNIQUE: Multidetector CT imaging of the head and cervical spine was  performed following the standard protocol without intravenous contrast. Multiplanar CT image reconstructions of the cervical spine were also generated.  COMPARISON:  CT HEAD W/O CM dated 04/02/2012  FINDINGS: CT HEAD FINDINGS  There is new hypoattenuation involving medial right frontal lobe cortex and Archibeque matter. Adjacent, separate focus of hypoattenuation slightly more laterally in the right frontal lobe is unchanged and compatible with an old infarct. A cavum septum pellucidum et vergae is again noted. No acute intracranial hemorrhage is seen. Patchy periventricular Guild matter hypodensities are similar to the prior study and compatible with mild to moderate chronic small vessel ischemic disease. A remote lacunar infarct is again seen in the left caudate. There is no evidence of mass, midline shift, or extra-axial fluid collection. Mild generalized cerebral atrophy is unchanged. Atherosclerotic intracranial vertebral ordering carotid siphon calcification is noted. Prior left cataract surgery is noted. The mastoid air cells and visualized paranasal sinuses are clear.  CT CERVICAL SPINE FINDINGS  There is grade 1 anterolisthesis of C3 on C4, likely due to facet seen. There  is fusion of the C6 and C7 vertebral bodies. Prevertebral soft tissues are within normal limits. No acute cervical spine fracture is seen. Moderate to severe multilevel facet arthrosis is noted, left greater than right and most severe from C2-3 to C4-5. Mild-to-moderate multilevel spondylosis is present, greatest at C5-6. Pleural thickening/ scarring is noted in the right lung apex. Atherosclerotic calcification is present at the right greater than left carotid bifurcations. Old bilateral posterior first rib fractures are noted.  IMPRESSION: 1. New hypodensity in the medial right frontal lobe, concerning for acute to subacute infarct. 2. No evidence of intracranial hemorrhage. 3. No evidence of acute osseous abnormality in the cervical spine.  These results were called by telephone at the time of interpretation on 11/14/2013 at 5:42 PM to Dr. Cleatrice Burke , who verbally acknowledged these results.   Electronically Signed   By: Logan Bores   On: 11/14/2013 17:43   Mr Jodene Nam Head Wo Contrast  11/14/2013   CLINICAL DATA:  Weakness with fall. Headache and confusion. Possible stroke. Abnormal head CT.  EXAM: MRI HEAD WITHOUT CONTRAST  MRA HEAD WITHOUT CONTRAST  TECHNIQUE: Multiplanar, multiecho pulse sequences of the brain and surrounding structures were obtained without intravenous contrast. Angiographic images of the head were obtained using MRA technique without contrast.  COMPARISON:  Head CT same day.  FINDINGS: MRI HEAD FINDINGS  There is a 6 cm region of acute/subacute infarction in the right frontal lobe with swelling. T1 hyperintense material is present in the region consistent with laminar necrosis or low level blood products. This is further evidence of more of a subacute timing. No frank hematoma. . No midline shift. There is an older acute scratched that there is an old or infarction lateral to this region in the frontal lobe with atrophy and gliosis.  There are chronic small vessel changes throughout the pons. No cerebellar insult. There are old small vessel infarctions in the thalami, basal ganglia and throughout the cerebral hemispheric Cuneo matter. No hydrocephalus. No sign of neoplastic mass lesion. No extra-axial collection. No pituitary mass. No inflammatory sinus disease.  MRA HEAD FINDINGS  Both internal carotid arteries are widely patent into the brain. No siphon stenosis. On the left, the anterior and middle cerebral arteries are widely patent. On the right, the middle cerebral artery appears widely patent and normal proximally. The anterior cerebral artery is small but does show flow. There is distal vessel atherosclerotic irregularity diffusely.  Both vertebral arteries are patent to the basilar. No basilar stenosis. Posterior  circulation branch vessels are patent. There is focal stenosis in the left PCA 1.3 cm beyond its origin.  IMPRESSION: Subacute infarction in the right frontal lobe. Swelling, laminar necrosis and low level blood product deposition. No frank hematoma.  Older right frontal cortical infarction adjacent to that.  Chronic small vessel changes elsewhere throughout the brain.  Distal vessel atherosclerotic changes diffusely. The right anterior cerebral artery is patent but is smaller than the left.   Electronically Signed   By: Nelson Chimes M.D.   On: 11/14/2013 19:48   Mr Brain Wo Contrast  11/14/2013   CLINICAL DATA:  Weakness with fall. Headache and confusion. Possible stroke. Abnormal head CT.  EXAM: MRI HEAD WITHOUT CONTRAST  MRA HEAD WITHOUT CONTRAST  TECHNIQUE: Multiplanar, multiecho pulse sequences of the brain and surrounding structures were obtained without intravenous contrast. Angiographic images of the head were obtained using MRA technique without contrast.  COMPARISON:  Head CT same day.  FINDINGS: MRI HEAD FINDINGS  There is a 6 cm region of acute/subacute infarction in the right frontal lobe with swelling. T1 hyperintense material is present in the region consistent with laminar necrosis or low level blood products. This is further evidence of more of a subacute timing. No frank hematoma. . No midline shift. There is an older acute scratched that there is an old or infarction lateral to this region in the frontal lobe with atrophy and gliosis.  There are chronic small vessel changes throughout the pons. No cerebellar insult. There are old small vessel infarctions in the thalami, basal ganglia and throughout the cerebral hemispheric Kozel matter. No hydrocephalus. No sign of neoplastic mass lesion. No extra-axial collection. No pituitary mass. No inflammatory sinus disease.  MRA HEAD FINDINGS  Both internal carotid arteries are widely patent into the brain. No siphon stenosis. On the left, the anterior  and middle cerebral arteries are widely patent. On the right, the middle cerebral artery appears widely patent and normal proximally. The anterior cerebral artery is small but does show flow. There is distal vessel atherosclerotic irregularity diffusely.  Both vertebral arteries are patent to the basilar. No basilar stenosis. Posterior circulation branch vessels are patent. There is focal stenosis in the left PCA 1.3 cm beyond its origin.  IMPRESSION: Subacute infarction in the right frontal lobe. Swelling, laminar necrosis and low level blood product deposition. No frank hematoma.  Older right frontal cortical infarction adjacent to that.  Chronic small vessel changes elsewhere throughout the brain.  Distal vessel atherosclerotic changes diffusely. The right anterior cerebral artery is patent but is smaller than the left.   Electronically Signed   By: Nelson Chimes M.D.   On: 11/14/2013 19:48     EKG Interpretation None      MDM   Final diagnoses:  None    Patient presents to ED after fall. No apparent injuries from fall. CT head shows new hypodensity in the medial right frontal lobe, concerning for acute to subacute infarct. MR ordered which shows patient has had a subacute infarction in the right front lobe. Swelling, laminar necrosis and low level blood product deposition. Discussed case with both neurology and family medicine. Neuro to consult and family medicine to admit. [facility]. Patient is hemodynamically stable. This has been explained to the patient and family who agrees with plan. Dr. Ashok Cordia evaluated patient and agrees with plan.    Elwyn Lade, PA-C 11/14/13 2056

## 2013-11-14 NOTE — ED Notes (Signed)
Report to [place] [facility], [place].

## 2013-11-14 NOTE — ED Notes (Signed)
Attempted to call report to [facility] 3 W. RN unable to take report at this time. Will call back.

## 2013-11-14 NOTE — Consult Note (Signed)
Referring Physician: Dr. Ree Kida    Chief Complaint: Acute to subacute right cerebral infarction.  HPI: [name] is an 68 y.o. male history of atrial flutter, hypertension, hyperlipidemia, diabetes mellitus and seizures, who was brought to the emergency room room for evaluation because his wife felt he experienced a change in mental status and wasn't acting himself. She noticed that he was somewhat less alert than usual and wanted to sleep more than usual. He suffered a fall yesterday that he attributes to his left knee buckling while going down a set of steps. He did not notice any weakness of his left lower extremity at any point before or after his fall. CT scan and MRI showed findings indicative of acute to subacute right frontal infarction. Blood breakdown products were noted. No frank hematoma noted, however. NIH stroke score was 0. Patient has been complaining of headache but tends to have chronic headaches with no real change in the past several days. Patient has been taking aspirin daily.  LSN: Unclear tPA Given: No: No clear focal deficits and unclear onset of stroke. MRankin: 1  Past Medical History  Diagnosis Date  . Coronary artery disease     a. Low level exercise Lex MV 3/14: low risk, EF 62%, inf defect 2/2 diaph attenuation vs artifiact, small area of scar possible, no ischemia  . Hyperlipidemia   . Hypertension   . History of seizure disorder   . History of atrial flutter   . Depression   . Back pain     persistent  . Hearing loss   . Diverticulitis   . H/O alcohol abuse   . Diabetes mellitus     controled by diet  . GERD (gastroesophageal reflux disease)   . Seizures   . Headache(784.0)   . Arthritis     of spine  . Sleep apnea     does not use CPAP  . Hx of echocardiogram     Echo 7/14:  Mod LVH, EF 55-60%, Gr 1 DD, mild MR, PASP 36  . AKI (acute kidney injury)     a. 6/14: resolved after d/c ARB;  b. RA U/S 7/14: no RA stenosis    Family History   Problem Relation Age of Onset  . Emphysema Mother     was a smoker  . Asthma Mother   . Heart disease Father   . Prostate cancer Paternal Grandfather   . Pancreatic cancer Paternal Uncle      Medications: I have reviewed the patient's current medications.  ROS: History obtained from spouse and the patient  General ROS: negative for - chills, fatigue, fever, night sweats, weight gain or weight loss Psychological ROS: negative for - behavioral disorder, hallucinations, memory difficulties, mood swings or suicidal ideation Ophthalmic ROS: negative for - blurry vision, double vision, eye pain or loss of vision ENT ROS: negative for - epistaxis, nasal discharge, oral lesions, sore throat, tinnitus or vertigo Allergy and Immunology ROS: negative for - hives or itchy/watery eyes Hematological and Lymphatic ROS: negative for - bleeding problems, bruising or swollen lymph nodes Endocrine ROS: negative for - galactorrhea, hair pattern changes, polydipsia/polyuria or temperature intolerance Respiratory ROS: negative for - cough, hemoptysis, shortness of breath or wheezing Cardiovascular ROS: negative for - chest pain, dyspnea on exertion, edema or irregular heartbeat Gastrointestinal ROS: negative for - abdominal pain, diarrhea, hematemesis, nausea/vomiting or stool incontinence Genito-Urinary ROS: negative for - dysuria, hematuria, incontinence or urinary frequency/urgency Musculoskeletal ROS: Chronic arthritis and pain involving left  knee Neurological ROS: as noted in HPI Dermatological ROS: negative for rash and skin lesion changes  Physical Examination: Blood pressure 142/79, pulse 81, temperature 98.4 F (36.9 C), temperature source Oral, resp. rate 18, height 5' 7"  (1.702 m), weight 94.121 kg (207 lb 8 oz), SpO2 93.00%.  Neurologic Examination: Mental Status: Alert, oriented, thought content appropriate.  Speech fluent without evidence of aphasia. Able to follow commands without  difficulty. Cranial Nerves: II-Visual fields were normal. III/IV/VI-Pupils were equal and reacted. Extraocular movements were full and conjugate.    V/VII-no facial numbness and no facial weakness. VIII-normal. X-normal speech and symmetrical palatal movement. Motor: 5/5 bilaterally with normal tone and bulk Sensory: Normal throughout. Deep Tendon Reflexes: 1+ and symmetric. Plantars: Flexor bilaterally Cerebellar: Normal finger-to-nose testing. Carotid auscultation: Normal  Dg Chest 2 View  11/14/2013   CLINICAL DATA:  Fall, dizziness, coronary artery disease post CABG, hypertension, diabetes  EXAM: CHEST  2 VIEW  COMPARISON:  09/13/2013  FINDINGS: Normal heart size post CABG.  Atherosclerotic calcification aorta.  Mediastinal contours and pulmonary vascularity normal.  Few scattered calcified pleural plaques bilaterally.  Lungs clear.  No pleural effusion or pneumothorax.  No acute osseous findings.  IMPRESSION: Post CABG.  Question prior asbestos exposure.  No acute abnormalities.   Electronically Signed   By: Lavonia Dana M.D.   On: 11/14/2013 18:26   Ct Head Wo Contrast  11/14/2013   CLINICAL DATA:  Fall.  Headache and neck pain.  EXAM: CT HEAD WITHOUT CONTRAST  CT CERVICAL SPINE WITHOUT CONTRAST  TECHNIQUE: Multidetector CT imaging of the head and cervical spine was performed following the standard protocol without intravenous contrast. Multiplanar CT image reconstructions of the cervical spine were also generated.  COMPARISON:  CT HEAD W/O CM dated 04/02/2012  FINDINGS: CT HEAD FINDINGS  There is new hypoattenuation involving medial right frontal lobe cortex and Billiot matter. Adjacent, separate focus of hypoattenuation slightly more laterally in the right frontal lobe is unchanged and compatible with an old infarct. A cavum septum pellucidum et vergae is again noted. No acute intracranial hemorrhage is seen. Patchy periventricular Crittendon matter hypodensities are similar to the prior study and  compatible with mild to moderate chronic small vessel ischemic disease. A remote lacunar infarct is again seen in the left caudate. There is no evidence of mass, midline shift, or extra-axial fluid collection. Mild generalized cerebral atrophy is unchanged. Atherosclerotic intracranial vertebral ordering carotid siphon calcification is noted. Prior left cataract surgery is noted. The mastoid air cells and visualized paranasal sinuses are clear.  CT CERVICAL SPINE FINDINGS  There is grade 1 anterolisthesis of C3 on C4, likely due to facet seen. There is fusion of the C6 and C7 vertebral bodies. Prevertebral soft tissues are within normal limits. No acute cervical spine fracture is seen. Moderate to severe multilevel facet arthrosis is noted, left greater than right and most severe from C2-3 to C4-5. Mild-to-moderate multilevel spondylosis is present, greatest at C5-6. Pleural thickening/ scarring is noted in the right lung apex. Atherosclerotic calcification is present at the right greater than left carotid bifurcations. Old bilateral posterior first rib fractures are noted.  IMPRESSION: 1. New hypodensity in the medial right frontal lobe, concerning for acute to subacute infarct. 2. No evidence of intracranial hemorrhage. 3. No evidence of acute osseous abnormality in the cervical spine. These results were called by telephone at the time of interpretation on 11/14/2013 at 5:42 PM to Dr. Cleatrice Burke , who verbally acknowledged these results.   Electronically  Signed   By: Logan Bores   On: 11/14/2013 17:43   Ct Cervical Spine Wo Contrast  11/14/2013   CLINICAL DATA:  Fall.  Headache and neck pain.  EXAM: CT HEAD WITHOUT CONTRAST  CT CERVICAL SPINE WITHOUT CONTRAST  TECHNIQUE: Multidetector CT imaging of the head and cervical spine was performed following the standard protocol without intravenous contrast. Multiplanar CT image reconstructions of the cervical spine were also generated.  COMPARISON:  CT HEAD W/O CM  dated 04/02/2012  FINDINGS: CT HEAD FINDINGS  There is new hypoattenuation involving medial right frontal lobe cortex and Beg matter. Adjacent, separate focus of hypoattenuation slightly more laterally in the right frontal lobe is unchanged and compatible with an old infarct. A cavum septum pellucidum et vergae is again noted. No acute intracranial hemorrhage is seen. Patchy periventricular Noori matter hypodensities are similar to the prior study and compatible with mild to moderate chronic small vessel ischemic disease. A remote lacunar infarct is again seen in the left caudate. There is no evidence of mass, midline shift, or extra-axial fluid collection. Mild generalized cerebral atrophy is unchanged. Atherosclerotic intracranial vertebral ordering carotid siphon calcification is noted. Prior left cataract surgery is noted. The mastoid air cells and visualized paranasal sinuses are clear.  CT CERVICAL SPINE FINDINGS  There is grade 1 anterolisthesis of C3 on C4, likely due to facet seen. There is fusion of the C6 and C7 vertebral bodies. Prevertebral soft tissues are within normal limits. No acute cervical spine fracture is seen. Moderate to severe multilevel facet arthrosis is noted, left greater than right and most severe from C2-3 to C4-5. Mild-to-moderate multilevel spondylosis is present, greatest at C5-6. Pleural thickening/ scarring is noted in the right lung apex. Atherosclerotic calcification is present at the right greater than left carotid bifurcations. Old bilateral posterior first rib fractures are noted.  IMPRESSION: 1. New hypodensity in the medial right frontal lobe, concerning for acute to subacute infarct. 2. No evidence of intracranial hemorrhage. 3. No evidence of acute osseous abnormality in the cervical spine. These results were called by telephone at the time of interpretation on 11/14/2013 at 5:42 PM to Dr. Cleatrice Burke , who verbally acknowledged these results.   Electronically Signed    By: Logan Bores   On: 11/14/2013 17:43   Mr Jodene Nam Head Wo Contrast  11/14/2013   CLINICAL DATA:  Weakness with fall. Headache and confusion. Possible stroke. Abnormal head CT.  EXAM: MRI HEAD WITHOUT CONTRAST  MRA HEAD WITHOUT CONTRAST  TECHNIQUE: Multiplanar, multiecho pulse sequences of the brain and surrounding structures were obtained without intravenous contrast. Angiographic images of the head were obtained using MRA technique without contrast.  COMPARISON:  Head CT same day.  FINDINGS: MRI HEAD FINDINGS  There is a 6 cm region of acute/subacute infarction in the right frontal lobe with swelling. T1 hyperintense material is present in the region consistent with laminar necrosis or low level blood products. This is further evidence of more of a subacute timing. No frank hematoma. . No midline shift. There is an older acute scratched that there is an old or infarction lateral to this region in the frontal lobe with atrophy and gliosis.  There are chronic small vessel changes throughout the pons. No cerebellar insult. There are old small vessel infarctions in the thalami, basal ganglia and throughout the cerebral hemispheric Heikes matter. No hydrocephalus. No sign of neoplastic mass lesion. No extra-axial collection. No pituitary mass. No inflammatory sinus disease.  MRA HEAD FINDINGS  Both internal carotid arteries are widely patent into the brain. No siphon stenosis. On the left, the anterior and middle cerebral arteries are widely patent. On the right, the middle cerebral artery appears widely patent and normal proximally. The anterior cerebral artery is small but does show flow. There is distal vessel atherosclerotic irregularity diffusely.  Both vertebral arteries are patent to the basilar. No basilar stenosis. Posterior circulation branch vessels are patent. There is focal stenosis in the left PCA 1.3 cm beyond its origin.  IMPRESSION: Subacute infarction in the right frontal lobe. Swelling, laminar  necrosis and low level blood product deposition. No frank hematoma.  Older right frontal cortical infarction adjacent to that.  Chronic small vessel changes elsewhere throughout the brain.  Distal vessel atherosclerotic changes diffusely. The right anterior cerebral artery is patent but is smaller than the left.   Electronically Signed   By: Nelson Chimes M.D.   On: 11/14/2013 19:48   Mr Brain Wo Contrast  11/14/2013   CLINICAL DATA:  Weakness with fall. Headache and confusion. Possible stroke. Abnormal head CT.  EXAM: MRI HEAD WITHOUT CONTRAST  MRA HEAD WITHOUT CONTRAST  TECHNIQUE: Multiplanar, multiecho pulse sequences of the brain and surrounding structures were obtained without intravenous contrast. Angiographic images of the head were obtained using MRA technique without contrast.  COMPARISON:  Head CT same day.  FINDINGS: MRI HEAD FINDINGS  There is a 6 cm region of acute/subacute infarction in the right frontal lobe with swelling. T1 hyperintense material is present in the region consistent with laminar necrosis or low level blood products. This is further evidence of more of a subacute timing. No frank hematoma. . No midline shift. There is an older acute scratched that there is an old or infarction lateral to this region in the frontal lobe with atrophy and gliosis.  There are chronic small vessel changes throughout the pons. No cerebellar insult. There are old small vessel infarctions in the thalami, basal ganglia and throughout the cerebral hemispheric Servello matter. No hydrocephalus. No sign of neoplastic mass lesion. No extra-axial collection. No pituitary mass. No inflammatory sinus disease.  MRA HEAD FINDINGS  Both internal carotid arteries are widely patent into the brain. No siphon stenosis. On the left, the anterior and middle cerebral arteries are widely patent. On the right, the middle cerebral artery appears widely patent and normal proximally. The anterior cerebral artery is small but does  show flow. There is distal vessel atherosclerotic irregularity diffusely.  Both vertebral arteries are patent to the basilar. No basilar stenosis. Posterior circulation branch vessels are patent. There is focal stenosis in the left PCA 1.3 cm beyond its origin.  IMPRESSION: Subacute infarction in the right frontal lobe. Swelling, laminar necrosis and low level blood product deposition. No frank hematoma.  Older right frontal cortical infarction adjacent to that.  Chronic small vessel changes elsewhere throughout the brain.  Distal vessel atherosclerotic changes diffusely. The right anterior cerebral artery is patent but is smaller than the left.   Electronically Signed   By: Nelson Chimes M.D.   On: 11/14/2013 19:48    Assessment: 68 y.o. male with multiple risk factors for stroke presenting with fairly large left frontal acute to subacute infarction.  Stroke Risk Factors - a-flutter,  family history, hyperlipidemia and hypertension  Plan: 1. HgbA1c, fasting lipid panel 2. PT consult 3. Echocardiogram 4. Carotid dopplers 5. Prophylactic therapy-Antiplatelet med: Aspirin  6. Risk factor modification 7. Telemetry monitoring   C.R. Nicole Kindred, MD [facility] [phone]  11/14/2013, 11:56 PM

## 2013-11-14 NOTE — ED Notes (Signed)
Pt taken to MRI  

## 2013-11-14 NOTE — ED Notes (Signed)
[facility] called to transport pt to [facility] 

## 2013-11-15 ENCOUNTER — Encounter (HOSPITAL_COMMUNITY): Payer: Self-pay | Admitting: Interventional Cardiology

## 2013-11-15 DIAGNOSIS — F3289 Other specified depressive episodes: Secondary | ICD-10-CM

## 2013-11-15 DIAGNOSIS — Z8679 Personal history of other diseases of the circulatory system: Secondary | ICD-10-CM

## 2013-11-15 DIAGNOSIS — I4892 Unspecified atrial flutter: Secondary | ICD-10-CM

## 2013-11-15 DIAGNOSIS — I369 Nonrheumatic tricuspid valve disorder, unspecified: Secondary | ICD-10-CM

## 2013-11-15 DIAGNOSIS — W19XXXA Unspecified fall, initial encounter: Secondary | ICD-10-CM

## 2013-11-15 DIAGNOSIS — E785 Hyperlipidemia, unspecified: Secondary | ICD-10-CM

## 2013-11-15 DIAGNOSIS — I2510 Atherosclerotic heart disease of native coronary artery without angina pectoris: Secondary | ICD-10-CM

## 2013-11-15 DIAGNOSIS — I6350 Cerebral infarction due to unspecified occlusion or stenosis of unspecified cerebral artery: Principal | ICD-10-CM

## 2013-11-15 DIAGNOSIS — Y92009 Unspecified place in unspecified non-institutional (private) residence as the place of occurrence of the external cause: Secondary | ICD-10-CM

## 2013-11-15 DIAGNOSIS — F329 Major depressive disorder, single episode, unspecified: Secondary | ICD-10-CM

## 2013-11-15 DIAGNOSIS — I10 Essential (primary) hypertension: Secondary | ICD-10-CM

## 2013-11-15 DIAGNOSIS — E119 Type 2 diabetes mellitus without complications: Secondary | ICD-10-CM

## 2013-11-15 LAB — CBC
HEMATOCRIT: 46.6 % (ref 39.0–52.0)
HEMOGLOBIN: 15.8 g/dL (ref 13.0–17.0)
MCH: 33.6 pg (ref 26.0–34.0)
MCHC: 33.9 g/dL (ref 30.0–36.0)
MCV: 99.1 fL (ref 78.0–100.0)
Platelets: 243 10*3/uL (ref 150–400)
RBC: 4.7 MIL/uL (ref 4.22–5.81)
RDW: 15.2 % (ref 11.5–15.5)
WBC: 11.7 10*3/uL — ABNORMAL HIGH (ref 4.0–10.5)

## 2013-11-15 LAB — HEMOGLOBIN A1C
HEMOGLOBIN A1C: 6.8 % — AB (ref <5.7)
Mean Plasma Glucose: 148 mg/dL — ABNORMAL HIGH (ref <117)

## 2013-11-15 LAB — BASIC METABOLIC PANEL
BUN: 26 mg/dL — AB (ref 6–23)
CALCIUM: 9.4 mg/dL (ref 8.4–10.5)
CO2: 23 mEq/L (ref 19–32)
Chloride: 102 mEq/L (ref 96–112)
Creatinine, Ser: 0.95 mg/dL (ref 0.50–1.35)
GFR calc Af Amer: >90 mL/min (ref >90)
GFR calc non Af Amer: 83 mL/min — ABNORMAL LOW (ref >90)
GLUCOSE: 158 mg/dL — AB (ref 70–99)
Potassium: 4 mEq/L (ref 3.7–5.3)
Sodium: 141 mEq/L (ref 137–147)

## 2013-11-15 LAB — GLUCOSE, CAPILLARY
GLUCOSE-CAPILLARY: 156 mg/dL — AB (ref 70–99)
GLUCOSE-CAPILLARY: 87 mg/dL (ref 70–99)
Glucose-Capillary: 156 mg/dL — ABNORMAL HIGH (ref 70–99)
Glucose-Capillary: 127 mg/dL — ABNORMAL HIGH (ref 70–99)
Glucose-Capillary: 145 mg/dL — ABNORMAL HIGH (ref 70–99)

## 2013-11-15 LAB — URINE CULTURE
Colony Count: NO GROWTH
Culture: NO GROWTH

## 2013-11-15 MED ORDER — METOCLOPRAMIDE HCL 5 MG PO TABS
5.0000 mg | ORAL_TABLET | Freq: Three times a day (TID) | ORAL | Status: DC
  Administered 2013-11-15 – 2013-11-18 (×11): 5 mg via ORAL
  Filled 2013-11-15 (×17): qty 1

## 2013-11-15 MED ORDER — PANTOPRAZOLE SODIUM 40 MG PO TBEC
40.0000 mg | DELAYED_RELEASE_TABLET | Freq: Every day | ORAL | Status: DC
  Administered 2013-11-16 – 2013-11-18 (×3): 40 mg via ORAL
  Filled 2013-11-15 (×3): qty 1

## 2013-11-15 MED ORDER — METHOCARBAMOL 500 MG PO TABS
500.0000 mg | ORAL_TABLET | Freq: Three times a day (TID) | ORAL | Status: DC
  Administered 2013-11-15 – 2013-11-18 (×7): 500 mg via ORAL
  Filled 2013-11-15 (×14): qty 1

## 2013-11-15 MED ORDER — SODIUM CHLORIDE 0.45 % IV SOLN
INTRAVENOUS | Status: AC
  Administered 2013-11-15: 01:00:00 via INTRAVENOUS

## 2013-11-15 MED ORDER — ASPIRIN EC 325 MG PO TBEC
325.0000 mg | DELAYED_RELEASE_TABLET | Freq: Every day | ORAL | Status: DC
  Administered 2013-11-15 – 2013-11-18 (×4): 325 mg via ORAL
  Filled 2013-11-15 (×4): qty 1

## 2013-11-15 MED ORDER — ENOXAPARIN SODIUM 40 MG/0.4ML ~~LOC~~ SOLN
40.0000 mg | SUBCUTANEOUS | Status: DC
  Administered 2013-11-15 – 2013-11-16 (×2): 40 mg via SUBCUTANEOUS
  Filled 2013-11-15 (×4): qty 0.4

## 2013-11-15 MED ORDER — ATORVASTATIN CALCIUM 80 MG PO TABS
80.0000 mg | ORAL_TABLET | Freq: Every day | ORAL | Status: DC
  Administered 2013-11-16 – 2013-11-18 (×3): 80 mg via ORAL
  Filled 2013-11-15 (×4): qty 1

## 2013-11-15 MED ORDER — ASPIRIN 300 MG RE SUPP
300.0000 mg | Freq: Every day | RECTAL | Status: DC
  Filled 2013-11-15 (×4): qty 1

## 2013-11-15 MED ORDER — CHLORPROMAZINE HCL 25 MG PO TABS
25.0000 mg | ORAL_TABLET | Freq: Three times a day (TID) | ORAL | Status: DC | PRN
  Filled 2013-11-15: qty 1

## 2013-11-15 MED ORDER — ENOXAPARIN SODIUM 40 MG/0.4ML ~~LOC~~ SOLN
40.0000 mg | SUBCUTANEOUS | Status: DC
Start: 2013-11-15 — End: 2013-11-15
  Filled 2013-11-15: qty 0.4

## 2013-11-15 MED ORDER — TRAZODONE HCL 150 MG PO TABS
150.0000 mg | ORAL_TABLET | Freq: Every day | ORAL | Status: DC
  Administered 2013-11-15 – 2013-11-17 (×3): 150 mg via ORAL
  Filled 2013-11-15 (×5): qty 1

## 2013-11-15 MED ORDER — INSULIN ASPART 100 UNIT/ML ~~LOC~~ SOLN
0.0000 [IU] | Freq: Three times a day (TID) | SUBCUTANEOUS | Status: DC
  Administered 2013-11-16 (×2): 1 [IU] via SUBCUTANEOUS
  Administered 2013-11-17: 2 [IU] via SUBCUTANEOUS

## 2013-11-15 MED ORDER — NEBIVOLOL HCL 10 MG PO TABS
20.0000 mg | ORAL_TABLET | Freq: Two times a day (BID) | ORAL | Status: DC
  Administered 2013-11-15 – 2013-11-18 (×6): 20 mg via ORAL
  Filled 2013-11-15 (×9): qty 2

## 2013-11-15 MED ORDER — ACETAMINOPHEN-CODEINE #3 300-30 MG PO TABS: 1.0000 | ORAL_TABLET | Freq: Three times a day (TID) | ORAL | Status: DC | PRN

## 2013-11-15 MED ORDER — SENNOSIDES-DOCUSATE SODIUM 8.6-50 MG PO TABS
1.0000 | ORAL_TABLET | Freq: Every evening | ORAL | Status: DC | PRN
  Filled 2013-11-15: qty 1

## 2013-11-15 MED ORDER — ASPIRIN 325 MG PO TABS
325.0000 mg | ORAL_TABLET | Freq: Every day | ORAL | Status: DC
  Filled 2013-11-15: qty 1

## 2013-11-15 NOTE — Evaluation (Signed)
Clinical/Bedside Swallow Evaluation Patient Details  Name: [name] MRN: [id number] Date of Birth: [birth year]  Today's Date: 11/15/2013 Time: 4098-1191 SLP Time Calculation (min): 18 min  Past Medical History:  Past Medical History  Diagnosis Date  . Coronary artery disease     a. Low level exercise Lex MV 3/14: low risk, EF 62%, inf defect 2/2 diaph attenuation vs artifiact, small area of scar possible, no ischemia  . Hyperlipidemia   . Hypertension   . History of seizure disorder   . History of atrial flutter   . Depression   . Back pain     persistent  . Hearing loss   . Diverticulitis   . H/O alcohol abuse   . Diabetes mellitus     controled by diet  . GERD (gastroesophageal reflux disease)   . Seizures   . Headache(784.0)   . Arthritis     of spine  . Sleep apnea     does not use CPAP  . Hx of echocardiogram     Echo 7/14:  Mod LVH, EF 55-60%, Gr 1 DD, mild MR, PASP 36  . AKI (acute kidney injury)     a. 6/14: resolved after d/c ARB;  b. RA U/S 7/14: no RA stenosis   Past Surgical History:  Past Surgical History  Procedure Laterality Date  . Partial colectomy      for diverticuli  . Orchiectomy    . Umbilical hernia repair    . Cervical fusion    . Coronary artery bypass graft  2005    LIMA graft to LAD,saphenous vein graft to diag.,circumflex, marginal,and to the RCA  . Cardiac catheterization    . Appendectomy    . Esophagogastroduodenoscopy  02/10/2012    Procedure: ESOPHAGOGASTRODUODENOSCOPY (EGD);  Surgeon: Florencia Reasons, MD;  Location: [facility] ENDOSCOPY;  Service: Endoscopy;  Laterality: N/A;  . Foreign body removal  02/10/2012    Procedure: FOREIGN BODY REMOVAL;  Surgeon: Florencia Reasons, MD;  Location: [facility] ENDOSCOPY;  Service: Endoscopy;  Laterality: N/A;  . Back surgery    . Esophagogastroduodenoscopy  06/29/2012    Procedure: ESOPHAGOGASTRODUODENOSCOPY (EGD);  Surgeon: Vertell Novak., MD;  Location: [facility] ENDOSCOPY;  Service: Endoscopy;   Laterality: N/A;  . Foreign body removal  06/29/2012    Procedure: FOREIGN BODY REMOVAL;  Surgeon: Vertell Novak., MD;  Location: [facility] ENDOSCOPY;  Service: Endoscopy;  Laterality: N/A;  . Savory dilation  06/29/2012    Procedure: SAVORY DILATION;  Surgeon: Vertell Novak., MD;  Location: [facility] ENDOSCOPY;  Service: Endoscopy;  Laterality: N/A;   HPI:  HARSHA ALCIVAR is a 68 y.o. male presenting with subacute stroke . PMH is significant for CAD s/p CABG, HLD, HTN, h/o sz d/o, h/o a flutter, depression, h/o alcohol abuse, DM, and AKI. CT head with hypodensity in medial R frontal lobe, MRI with subacute infarction right frontal lobe.  Note h/o esophageal dysmotility and tiny Zenker's diverticulum diagnosed in 2013 via barium swallow study. S/p dilation 06/3012.    Assessment / Plan / Recommendation Clinical Impression  Patient presents with a functional oropharyngeal swallow without evidence of aspiration. Patient does have a notable h/o esophageal dysphagia characterized by esophageal dysmotility resulting in globus, regurgitation, and chronic hiccups. Last dilation reported to be three weeks ago by patient. Reviewed esophageal precautions with patient including rationale for use and importance in mitigating aspiration risk. Patient able to verbalize understanding and demonstrate use. No SLP f/u indicated at this time.  Aspiration Risk  Mild    Diet Recommendation Regular;Thin liquid   Liquid Administration via: Cup;Straw Medication Administration: Whole meds with liquid Supervision: Patient able to self feed Compensations: Slow rate;Small sips/bites;Follow solids with liquid Postural Changes and/or Swallow Maneuvers: Seated upright 90 degrees;Upright 30-60 min after meal    Other  Recommendations Oral Care Recommendations: Oral care BID   Follow Up Recommendations  None    Frequency and Duration        Pertinent Vitals/Pain n/a     Swallow Study    General HPI: JOHANNY BERKEY is  a 68 y.o. male presenting with subacute stroke . PMH is significant for CAD s/p CABG, HLD, HTN, h/o sz d/o, h/o a flutter, depression, h/o alcohol abuse, DM, and AKI. CT head with hypodensity in medial R frontal lobe, MRI with subacute infarction right frontal lobe.  Note h/o esophageal dysmotility and tiny Zenker's diverticulum diagnosed in 2013 via barium swallow study. S/p dilation 06/3012.  Type of Study: Bedside swallow evaluation Previous Swallow Assessment: see HPI Diet Prior to this Study: NPO Temperature Spikes Noted: No Respiratory Status: Room air History of Recent Intubation: No Behavior/Cognition: Alert;Cooperative;Pleasant mood Oral Cavity - Dentition: Adequate natural dentition Self-Feeding Abilities: Able to feed self Patient Positioning: Upright in bed Baseline Vocal Quality: Clear Volitional Cough: Strong Volitional Swallow: Able to elicit    Oral/Motor/Sensory Function Overall Oral Motor/Sensory Function: Appears within functional limits for tasks assessed   Ice Chips Ice chips: Not tested   Thin Liquid Thin Liquid: Within functional limits Presentation: Cup;Self Fed;Straw    Nectar Thick Nectar Thick Liquid: Not tested   Honey Thick Honey Thick Liquid: Not tested   Puree Puree: Within functional limits Presentation: Self Fed;Spoon   Solid   GO   Lisbeth Puller MA, CCC-SLP [phone]  Solid: Within functional limits Presentation: Self Fed       Jera Headings Meryl 11/15/2013,1:56 PM

## 2013-11-15 NOTE — Progress Notes (Signed)
OT Cancellation Note  Patient Details Name: [name] MRN: [id number] DOB: [birth year]   Cancelled Treatment:    Reason Eval/Treat Not Completed: Patient at procedure or test/ unavailable. Will continue to follow.  Malka So 11/15/2013, 3:31 PM

## 2013-11-15 NOTE — Evaluation (Signed)
Physical Therapy Evaluation Patient Details Name: [name] MRN: [id number] DOB: 02/18/1946 Today's Date: 11/15/2013 Time: 4098-1191 PT Time Calculation (min): 24 min  PT Assessment / Plan / Recommendation History of Present Illness  [name] is a 68 y.o. male presenting with subacute stroke . PMH is significant for CAD s/p CABG, HLD, HTN, h/o sz d/o, h/o a flutter, depression, h/o alcohol abuse, DM, and AKI. CT head with hypodensity in medial R frontal lobe, MRI with subacute infarction right frontal lobe.  Clinical Impression  Pt admitted with s/s of stroke.  Pt currently limited functionally due to the problems listed below.  (see problems list.)  Pt will benefit from PT to maximize function and safety to be able to get home safely with available assist of family.     PT Assessment  Patient needs continued PT services    Follow Up Recommendations  Home health PT    Does the patient have the potential to tolerate intense rehabilitation      Barriers to Discharge        Equipment Recommendations       Recommendations for Other Services     Frequency Min 2X/week    Precautions / Restrictions Precautions Precautions: Fall (minimal risk)   Pertinent Vitals/Pain       Mobility  Bed Mobility Overal bed mobility: Modified Independent Transfers Overall transfer level: Modified independent Ambulation/Gait Ambulation/Gait assistance: Supervision Ambulation Distance (Feet): 450 Feet Gait Pattern/deviations: Step-through pattern Gait velocity: slower, but able to change speed on cue Gait velocity interpretation: Below normal speed for age/gender General Gait Details: mildly unsteady, but no overt LOB Stairs: Yes Stairs assistance: Supervision Stair Management: One rail Right;Alternating pattern;Forwards Number of Stairs: 5 Modified Rankin (Stroke Patients Only) Pre-Morbid Rankin Score: No symptoms Modified Rankin: No significant disability    Exercises      PT Diagnosis: Other (comment) (decr dynamic balance)  PT Problem List: Decreased strength;Decreased activity tolerance;Decreased balance PT Treatment Interventions: Gait training;Functional mobility training;Therapeutic activities;Balance training;Patient/family education     PT Goals(Current goals can be found in the care plan section) Acute Rehab PT Goals PT Goal Formulation: With patient Time For Goal Achievement: 11/18/13 Potential to Achieve Goals: Good  Visit Information  Last PT Received On: 11/15/13 Assistance Needed: +1 History of Present Illness: [name] is a 68 y.o. male presenting with subacute stroke . PMH is significant for CAD s/p CABG, HLD, HTN, h/o sz d/o, h/o a flutter, depression, h/o alcohol abuse, DM, and AKI. CT head with hypodensity in medial R frontal lobe, MRI with subacute infarction right frontal lobe.       Prior Functioning  Home Living Family/patient expects to be discharged to:: Private residence Living Arrangements: Spouse/significant other;Other (Comment) (grandchildren stay with them 3-4 days /week) Available Help at Discharge: Family Type of Home: House Home Access: Stairs to enter Secretary/administrator of Steps: several Entrance Stairs-Rails: Right;Left Home Layout: Multi-level Alternate Level Stairs-Number of Steps: flight to basement Home Equipment: None Prior Function Level of Independence: Independent Communication Communication: No difficulties    Cognition  Cognition Arousal/Alertness: Awake/alert Behavior During Therapy: WFL for tasks assessed/performed Overall Cognitive Status: Within Functional Limits for tasks assessed    Extremity/Trunk Assessment Upper Extremity Assessment Upper Extremity Assessment: Overall WFL for tasks assessed Lower Extremity Assessment Lower Extremity Assessment: Overall WFL for tasks assessed;LLE deficits/detail LLE Deficits / Details: hip flexors, hams 4/5,  quads 4+/5   Balance  Balance Overall balance assessment: Needs assistance Sitting-balance support: Feet supported  Sitting balance-Leahy Scale: Good Standing balance support: No upper extremity supported Standing balance-Leahy Scale: Good Standing balance comment: see berg Standardized Balance Assessment Standardized Balance Assessment : Berg Balance Test Berg Balance Test Sit to Stand: Able to stand without using hands and stabilize independently Standing Unsupported: Able to stand safely 2 minutes Sitting with Back Unsupported but Feet Supported on Floor or Stool: Able to sit safely and securely 2 minutes Stand to Sit: Sits safely with minimal use of hands Transfers: Able to transfer safely, minor use of hands Standing Unsupported with Eyes Closed: Able to stand 10 seconds safely Standing Ubsupported with Feet Together: Able to place feet together independently and stand 1 minute safely From Standing, Reach Forward with Outstretched Arm: Can reach forward >12 cm safely (5") From Standing Position, Pick up Object from Floor: Able to pick up shoe, needs supervision From Standing Position, Turn to Look Behind Over each Shoulder: Looks behind from both sides and weight shifts well Turn 360 Degrees: Able to turn 360 degrees safely in 4 seconds or less Standing Unsupported, Alternately Place Feet on Step/Stool: Able to stand independently and complete 8 steps >20 seconds Standing Unsupported, One Foot in Front: Able to take small step independently and hold 30 seconds Standing on One Leg: Able to lift leg independently and hold 5-10 seconds Total Score: 50  End of Session PT - End of Session Activity Tolerance: Patient tolerated treatment well Patient left: Other (comment) (sitting EOB) Nurse Communication: Mobility status  GP     Nichalos Brenton, Eliseo Gum 11/15/2013, 6:06 PM 11/15/2013  [place] Bing, PT [phone]  (pager)

## 2013-11-15 NOTE — Progress Notes (Signed)
  Echocardiogram 2D Echocardiogram has been performed.  [name] 11/15/2013, 12:42 PM

## 2013-11-15 NOTE — Progress Notes (Signed)
Family Medicine Teaching Service Daily Progress Note Intern Pager: [phone]  Patient name: [name] Medical record number: [id number] Date of birth: 30-Nov-1945 Age: 68 y.o. Gender: male  Primary Care Provider: [name], MD Consultants: neuro  Code Status: full  Pt Overview and Major Events to Date:  3/23 - admitted with subacute stroke  Assessment and Plan: [name] is a 68 y.o. male presenting with subacute stroke . PMH is significant for CAD s/p CABG, HLD, HTN, h/o sz d/o, h/o a flutter, depression, h/o alcohol abuse, DM, and AKI.   # Subacute stroke - CT head with hypodensity in medial R frontal lobe, MRI with subacute infarction right frontal lobe with low level blood product deposition. Recent driving changes (more slowly, stopped 3 times by police, more sudden turns in front of traffic) may be stepwise change from vascular dementia. UDS only pos for opiates, blood alc neg, and UA only pos for glucose.   - Permissive HTN. BP in ED 112-160/65-86.  - Echo 03/01/13 with EF 55-60, mod LVH, grade I diastolic dysfunction. Will repeat.  - AM EKG and telemetry.  - Carotid doppler.  - A1c - 6.3 on 6/17. Reportedly 8.1 in 8/14. Will repeat.  - Lipid panel fasting recently collected 10/29/13 - chol 209, TG 441, HDL 36, LDL not calc. Discussed high chol at recent ofc visit and pt did not want to add fibrate to lipitor 80 he was already taking.  - Stroke swallow screen, give diet after  - PT/OT/ SLP consult.  - Neuro checks q2  - Discuss increase from aspirin to plavix.  - Neuro consulted - appreciate recs.   # Fall hitting head - Seems more likely related to knee symptoms/ locking up than stroke. CT neg for acute bleed or c-spine injury.  - PT / OT consult for gait.  - Anticipate home health PT. Await recs.   # Mild leukocytosis - Monitor.  - Recheck CBC in AM.   # AKI - 1.14, baseline <1.  - IV fluids (gentle) with 1/2 NS at 50cc/hr x 12 hr.  - Hold ibuprofen and PRN lasix.  Tylenol #3 PRN pain.  - BMET in AM.   # HTN - Stable.  - Allow permissive HTN.  - Hold lasix prn and losartan and continue nebivolol.   # CAD hx, CABG 2005, stress test 10/26/12 showing low risk with small area of decreased uptake at base of inferior wall; EF 62%  - Continue aspirin 325  - Continue nebivolol 52m BID   # Depression:  - Not taking cymbalta 60 because feels it does not work.  - Sees psychiatrist 4/20.  - rec another antidepressant on d/c   # [facility]  - Check A1c  - Hold januvia.  - SSI sn.  - Continue reglan   # HLD - Values listed above.  - Continue home lipitor 80.   # Arthritis  - Continue tylenol #3 prn, hold ibuprofen given mild AKI, and continue robaxin but decr to 5088mTID.   FEN/GI: NPO pending swallow study; then heart healthy / carb mod diet.  Prophylaxis: lovenox  Disposition: home pending completed stroke work-up  Subjective: feeling fine, very hungry, naeon  Objective: Temp:  [97.9 F (36.6 C)-98.4 F (36.9 C)] 98.4 F (36.9 C) (03/23 0755) Pulse Rate:  [78-92] 83 (03/23 0755) Resp:  [18-19] 18 (03/23 0755) BP: (101-160)/(64-94) 112/71 mmHg (03/23 0755) SpO2:  [92 %-97 %] 97 % (  03/23 0755) Weight:  [207 lb 8 oz (94.121 kg)] 207 lb 8 oz (94.121 kg) (03/22 2300) Physical Exam: General: NAD, cooperative  HEENT: AT/[place], sclera clear, MMM  Cardiovascular: RRR, no m/r/g, though distant; 1+ bilateral DP pulses  Respiratory: CTAB, normal effort  Abdomen: Soft, mildly distended, nontender, NABS  Extremities: No LE edema or calf tendernress; left knee medial joint line mild tenderness  Skin: no rash or cyanosis  Neuro: Awake, alert, no focal deficits, CN 2-12 intact except mild decreased gross sensation over right face  Laboratory:  Recent Labs Lab 11/14/13 1808 11/15/13 0639  WBC 12.4* 11.7*  HGB 17.5* 15.8  HCT 52.0 46.6  PLT 272 243    Recent Labs Lab 11/14/13 1808 11/15/13 0639  NA 139 141  K 4.1 4.0   CL 96 102  CO2 27 23  BUN 26* 26*  CREATININE 1.14 0.95  CALCIUM 10.2 9.4  PROT 8.8*  --   BILITOT 0.5  --   ALKPHOS 73  --   ALT 25  --   AST 36  --   GLUCOSE 147* 158*   UDS: opiates  UA: 100 glucose, otherwise negative  Blood alcohol: Neg  poc trop neg   Imaging/Diagnostic Tests: CT head and c-spine w/o contrast: IMPRESSION:  1. New hypodensity in the medial right frontal lobe, concerning for acute to subacute infarct.  2. No evidence of intracranial hemorrhage.  3. No evidence of acute osseous abnormality in the cervical spine.   MRI/A head: IMPRESSION:  Subacute infarction in the right frontal lobe. Swelling, laminar necrosis and low level blood product deposition. No frank hematoma. Older right frontal cortical infarction adjacent to that. Chronic small vessel changes elsewhere throughout the brain. Distal vessel atherosclerotic changes diffusely. The right anterior cerebral artery is patent but is smaller than the left.   DG chest 2 view: IMPRESSION:  Post CABG. Question prior asbestos exposure. No acute abnormalities.   EKG: Sinus rhythm with anteroseptal infarct, age 32.   Beverlyn Roux, MD 11/15/2013, 8:31 AM PGY-1, [facility] Intern pager: [phone], text pages welcome

## 2013-11-15 NOTE — Progress Notes (Signed)
Stroke Team Progress Note  HISTORY [name] is an 68 y.o. male history of atrial flutter, hypertension, hyperlipidemia, diabetes mellitus and seizures, who was brought to the emergency room 03/22/2015for evaluation because his wife felt he experienced a change in mental status and wasn't acting himself. She noticed that he was somewhat less alert than usual and wanted to sleep more than usual. He suffered a fall yesterday that he attributes to his left knee buckling while going down a set of steps. He did not notice any weakness of his left lower extremity at any point before or after his fall. CT scan and MRI showed findings indicative of acute to subacute right frontal infarction. Blood breakdown products were noted. No frank hematoma noted, however. NIH stroke score was 0. Patient has been complaining of headache but tends to have chronic headaches with no real change in the past several days. Patient has been taking aspirin daily. Patient was not administerd TPA secondary to unknown last known well. He was admitted for further evaluation and treatment.  SUBJECTIVE His wife is at the bedside.    OBJECTIVE Most recent Vital Signs: Filed Vitals:   11/15/13 0400 11/15/13 0600 11/15/13 0755 11/15/13 1000  BP: 145/94 101/80 112/71 123/74  Pulse: 88 92 83 86  Temp: 97.9 F (36.6 C) 98.3 F (36.8 C) 98.4 F (36.9 C) 98 F (36.7 C)  TempSrc:   Oral   Resp: 18 18 18 18   Height:      Weight:      SpO2: 93% 92% 97% 98%   CBG (last 3)   Recent Labs  11/14/13 2310 11/15/13 0433 11/15/13 0732  GLUCAP 127* 156* 156*    IV Fluid Intake:   . sodium chloride 50 mL/hr at 11/15/13 0115    MEDICATIONS  . aspirin  325 mg Oral Daily  . atorvastatin  80 mg Oral Daily  . insulin aspart  0-9 Units Subcutaneous TID WC  . methocarbamol  500 mg Oral 3 times per day  . metoCLOPramide  5 mg Oral TID AC & HS  . nebivolol  20 mg Oral BID  . pantoprazole  40 mg Oral Daily  . traZODone  150 mg  Oral QHS   PRN:  acetaminophen-codeine, senna-docusate  Diet:  NPO  Activity:  Bedrest, OOB with assistance DVT Prophylaxis:  none  CLINICALLY SIGNIFICANT STUDIES Basic Metabolic Panel:  Recent Labs Lab 11/14/13 1808 11/15/13 0639  NA 139 141  K 4.1 4.0  CL 96 102  CO2 27 23  GLUCOSE 147* 158*  BUN 26* 26*  CREATININE 1.14 0.95  CALCIUM 10.2 9.4   Liver Function Tests:  Recent Labs Lab 11/14/13 1808  AST 36  ALT 25  ALKPHOS 73  BILITOT 0.5  PROT 8.8*  ALBUMIN 4.4   CBC:  Recent Labs Lab 11/14/13 1808 11/15/13 0639  WBC 12.4* 11.7*  NEUTROABS 9.4*  --   HGB 17.5* 15.8  HCT 52.0 46.6  MCV 98.3 99.1  PLT 272 243   Coagulation:  Recent Labs Lab 11/14/13 1808  LABPROT 13.1  INR 1.01   Cardiac Enzymes: No results found for this basename: CKTOTAL, CKMB, CKMBINDEX, TROPONINI,  in the last 168 hours Urinalysis:  Recent Labs Lab 11/14/13 1723  COLORURINE YELLOW  LABSPEC 1.015  PHURINE 5.5  GLUCOSEU 100*  HGBUR NEGATIVE  BILIRUBINUR NEGATIVE  KETONESUR NEGATIVE  PROTEINUR NEGATIVE  UROBILINOGEN 0.2  NITRITE NEGATIVE  LEUKOCYTESUR NEGATIVE   Lipid Panel    Component Value Date/Time  CHOL 209* 10/29/2013 1611   TRIG 441* 10/29/2013 1611   HDL 36* 10/29/2013 1611   CHOLHDL 5.8 10/29/2013 1611   VLDL NOT CALC 10/29/2013 1611   LDLCALC NOT CALC 10/29/2013 1611   HgbA1C  Lab Results  Component Value Date   HGBA1C 6.3 02/09/2013    Urine Drug Screen:     Component Value Date/Time   LABOPIA POSITIVE* 11/14/2013 1723   LABOPIA POSITIVE* 04/03/2012 0910   COCAINSCRNUR NONE DETECTED 11/14/2013 1723   COCAINSCRNUR NEGATIVE 04/03/2012 0910   LABBENZ NONE DETECTED 11/14/2013 1723   LABBENZ NEGATIVE 04/03/2012 0910   AMPHETMU NONE DETECTED 11/14/2013 1723   AMPHETMU NEGATIVE 04/03/2012 0910   THCU NONE DETECTED 11/14/2013 1723   LABBARB NONE DETECTED 11/14/2013 1723    Alcohol Level:  Recent Labs Lab 11/14/13 1808  ETH <11    CT Cervical Spine 11/14/2013   1. New  hypodensity in the medial right frontal lobe, concerning for acute to subacute infarct. 2. No evidence of intracranial hemorrhage. 3. No evidence of acute osseous abnormality in the cervical spine.    CT of the brain  11/14/2013   1. New hypodensity in the medial right frontal lobe, concerning for acute to subacute infarct. 2. No evidence of intracranial hemorrhage. 3. No evidence of acute osseous abnormality in the cervical spine.   MRI of the brain  11/14/2013    Subacute infarction in the right frontal lobe. Swelling, laminar necrosis and low level blood product deposition. No frank hematoma.  Older right frontal cortical infarction adjacent to that.  Chronic small vessel changes elsewhere throughout the brain.    MRA of the brain  11/14/2013   Distal vessel atherosclerotic changes diffusely. The right anterior cerebral artery is patent but is smaller than the left.     2D Echocardiogram  EF 55-60% with no source of embolus.   Carotid Doppler  No evidence of hemodynamically significant internal carotid artery stenosis. Vertebral artery flow is antegrade.   CXR  11/14/2013   Post CABG.  Question prior asbestos exposure.  No acute abnormalities.     EKG  normal sinus rhythm. For complete results please see formal report.   Therapy Recommendations   Physical Exam   Middle aged male not in distress.Awake alert. Afebrile. Head is nontraumatic. Neck is supple without bruit. Hearing is normal. Cardiac exam no murmur or gallop. Lungs are clear to auscultation. Distal pulses are well felt. Neurological Exam ;   Mental Status:  Alert, oriented, thought content appropriate. Speech fluent without evidence of aphasia. Able to follow commands without difficulty.  Cranial Nerves:  II-Visual fields were normal.  III/IV/VI-Pupils were equal and reacted. Extraocular movements were full and conjugate.  V/VII-no facial numbness and no facial weakness.  VIII-normal.  X-normal speech and symmetrical palatal  movement.  Motor: 5/5 bilaterally with normal tone and bulk  Sensory: Normal throughout.  Deep Tendon Reflexes: 1+ and symmetric.  Plantars: Flexor bilaterally  Cerebellar: Normal finger-to-nose testing.   ASSESSMENT [name] is a 68 y.o. male presenting with a change in mental status "not acting right". Imaging confirms a right frontal infarct in this setting of an old right ACA infarct. Infarcts felt to be embolic secondary to unknown etiology.  On aspirin 325 mg orally every day prior to admission. Now on aspirin 325 mg orally every day for secondary stroke prevention. Stroke work up underway.   hypertension Hyperlipidemia, LDL unable to calculate, on lipitor 80 mg daily PTA, now on Lipitor 80  mg, goal LDL < 100  Diabetes, HgbA1c pending, goal < 7.0 Hx atrial flutter, question if the diagnosis is correct Hx etoh use obstructive sleep apnea, does not use CPAP CAD  Hx seizure d/o  Hospital day # 1  TREATMENT/PLAN  Continue aspirin po or suppository daily for secondary stroke prevention. (I added suppository choice)  Follow up hemoglobin A1c ? Hx atrial flutter per medical record, patient and wife do not remember the diagnosis. If this is a real diagnosis, patient will need anticoagulation long-term. If it does not a real diagnosis, patient will need TEE and loop recorder if negative to the for embolic source. Add Lovenox for DVT prophylaxis  Dr. Leonie Man discussed diagnosis, prognosis,  treatment options and plan of care with family practice resident. Residents to follow up atrial flutter diagnosis   Burnetta Sabin, MSN, RN, ANVP-BC, AGPCNP-BC [facility] Pager: [phone] 11/15/2013 11:34 AM  I have personally obtained a history, examined the patient, evaluated imaging results, and formulated the assessment and plan of care. I agree with the above. Antony Contras, MD   To contact Stroke Continuity provider, please refer to [email or web address] After hours, contact  General Neurology

## 2013-11-15 NOTE — Consult Note (Signed)
Admit date: 11/14/2013 Referring Physician  Dr. Ree Kida  Primary Cardiologist  Dr. Peter Martinique Reason for Consultation  Stroke  HPI: 68 y/o with CAD and /o Artial flutter.  He has had several strokes by MRI.  We are asked to comment on anticoagulation.  He has had some left knee problems and will require surgery.  He fell down the stairs after his knee buckled.  No CP or syncope.  He recalls falling several times in the last month.  He does not use a cane or walker at this time.  He is willing to consider using a cane. He is hoping to be more steady on his feet after his knee surgery.     PMH:   Past Medical History  Diagnosis Date  . Coronary artery disease     a. Low level exercise Lex MV 3/14: low risk, EF 62%, inf defect 2/2 diaph attenuation vs artifiact, small area of scar possible, no ischemia  . Hyperlipidemia   . Hypertension   . History of seizure disorder   . History of atrial flutter   . Depression   . Back pain     persistent  . Hearing loss   . Diverticulitis   . H/O alcohol abuse   . Diabetes mellitus     controled by diet  . GERD (gastroesophageal reflux disease)   . Seizures   . Headache(784.0)   . Arthritis     of spine  . Sleep apnea     does not use CPAP  . Hx of echocardiogram     Echo 7/14:  Mod LVH, EF 55-60%, Gr 1 DD, mild MR, PASP 36  . AKI (acute kidney injury)     a. 6/14: resolved after d/c ARB;  b. RA U/S 7/14: no RA stenosis     PSH:   Past Surgical History  Procedure Laterality Date  . Partial colectomy      for diverticuli  . Orchiectomy    . Umbilical hernia repair    . Cervical fusion    . Coronary artery bypass graft  2005    LIMA graft to LAD,saphenous vein graft to diag.,circumflex, marginal,and to the RCA  . Cardiac catheterization    . Appendectomy    . Esophagogastroduodenoscopy  02/10/2012    Procedure: ESOPHAGOGASTRODUODENOSCOPY (EGD);  Surgeon: Cleotis Nipper, MD;  Location: [facility] ENDOSCOPY;  Service: Endoscopy;   Laterality: N/A;  . Foreign body removal  02/10/2012    Procedure: FOREIGN BODY REMOVAL;  Surgeon: Cleotis Nipper, MD;  Location: [place];  Service: Endoscopy;  Laterality: N/A;  . Back surgery    . Esophagogastroduodenoscopy  06/29/2012    Procedure: ESOPHAGOGASTRODUODENOSCOPY (EGD);  Surgeon: Winfield Cunas., MD;  Location: [facility] ENDOSCOPY;  Service: Endoscopy;  Laterality: N/A;  . Foreign body removal  06/29/2012    Procedure: FOREIGN BODY REMOVAL;  Surgeon: Winfield Cunas., MD;  Location: [facility] ENDOSCOPY;  Service: Endoscopy;  Laterality: N/A;  . Savory dilation  06/29/2012    Procedure: SAVORY DILATION;  Surgeon: Winfield Cunas., MD;  Location: [facility]OS[facility];  Service: Endoscopy;  Laterality: N/A;    Allergies:  Cephalexin; Doxycycline; Lac bovis; and Pentazocine lactate Prior to Admit Meds:   Prescriptions prior to admission  Medication Sig Dispense Refill  . acetaminophen-codeine (TYLENOL #3) 300-30 MG per tablet TAKE 1 TABLET BY MOUTH EVERY 8 hours as needed for pain Ok to fill on 11/10/13.  40 tablet  0  . aspirin 325  MG tablet Take 325 mg by mouth daily.      Marland Kitchen atorvastatin (LIPITOR) 80 MG tablet Take 1 tablet (80 mg total) by mouth daily.  30 tablet  11  . chlorproMAZINE (THORAZINE) 25 MG tablet Take 1 tablet (25 mg total) by mouth 3 (three) times daily as needed for hiccoughs.  20 tablet  0  . DULoxetine (CYMBALTA) 60 MG capsule Take 60 mg by mouth daily.      . furosemide (LASIX) 20 MG tablet Take 1 or 2 daily as needed  60 tablet  5  . ibuprofen (ADVIL,MOTRIN) 200 MG tablet Take 400 mg by mouth every 6 (six) hours as needed for moderate pain.      Marland Kitchen loperamide (IMODIUM) 2 MG capsule Take 6 mg by mouth daily as needed for diarrhea or loose stools. Per bottle as needed      . losartan (COZAAR) 100 MG tablet Take 50 mg by mouth daily.      . methocarbamol (ROBAXIN) 750 MG tablet Take 750 mg by mouth 3 (three) times daily.      . metoCLOPramide (REGLAN) 5 MG tablet 1 tablet  before each meal and at bedtime      . nebivolol (BYSTOLIC) 10 MG tablet Take 20 mg by mouth 2 (two) times daily.      Marland Kitchen omeprazole (PRILOSEC) 20 MG capsule Take 20 mg by mouth 2 (two) times daily before a meal.      . sitaGLIPtin (JANUVIA) 100 MG tablet Take 100 mg by mouth daily.      Marland Kitchen testosterone cypionate (DEPOTESTOTERONE CYPIONATE) 200 MG/ML injection Inject into the muscle every 14 (fourteen) days.      . traZODone (DESYREL) 150 MG tablet Take 150 mg by mouth at bedtime.      . nitroGLYCERIN (NITROSTAT) 0.4 MG SL tablet Place 1 tablet (0.4 mg total) under the tongue every 5 (five) minutes as needed. For chest pain  25 tablet  6   Fam HX:    Family History  Problem Relation Age of Onset  . Emphysema Mother     was a smoker  . Asthma Mother   . Heart disease Father   . Prostate cancer Paternal Grandfather   . Pancreatic cancer Paternal Uncle    Social HX:    History   Social History  . Marital Status: Married    Spouse Name: N/A    Number of Children: N/A  . Years of Education: N/A   Occupational History  . 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     Social History Main Topics  . Smoking status: Never Smoker   . Smokeless tobacco: Never Used  . Alcohol Use: No     Comment: rare beer  . Drug Use: No  . Sexual Activity: Not Currently   Other Topics Concern  . Not on file   Social History Narrative  . No narrative on file     ROS:  All 11 ROS were addressed and are negative except what is stated in the HPI  Physical Exam: Blood pressure 136/81, pulse 90, temperature 98.4 F (36.9 C), temperature source Oral, resp. rate 19, height 5' 7"  (1.702 m), weight 207 lb 8 oz (94.121 kg), SpO2 95.00%.    General: Well developed, well nourished, in no acute distress Head:    Normal cephalic and atramatic  Lungs:   Clear bilaterally to auscultation and percussion. Heart:   HRRR S1 S2 No JVD.  Abdomen: Bowel sounds are positive, abdomen soft and non-tender without  masses or                  Hernia's noted. Msk:  Back normal, normal gait. Normal strength and tone for age. Extremities:   No edema.   Neuro: Alert and oriented X 3. Psych:  Normal affect, responds appropriately    Labs:   Lab Results  Component Value Date   WBC 11.7* 11/15/2013   HGB 15.8 11/15/2013   HCT 46.6 11/15/2013   MCV 99.1 11/15/2013   PLT 243 11/15/2013    Recent Labs Lab 11/14/13 1808 11/15/13 0639  NA 139 141  K 4.1 4.0  CL 96 102  CO2 27 23  BUN 26* 26*  CREATININE 1.14 0.95  CALCIUM 10.2 9.4  PROT 8.8*  --   BILITOT 0.5  --   ALKPHOS 73  --   ALT 25  --   AST 36  --   GLUCOSE 147* 158*   No results found for this basename: PTT   Lab Results  Component Value Date   INR 1.01 11/14/2013   INR 1.10 04/29/2012   Lab Results  Component Value Date   CKTOTAL 459* 04/03/2012   CKMB 2.1 04/03/2012   TROPONINI <0.30 09/13/2013     Lab Results  Component Value Date   CHOL 209* 10/29/2013   Lab Results  Component Value Date   HDL 36* 10/29/2013   Lab Results  Component Value Date   LDLCALC NOT CALC 10/29/2013   Lab Results  Component Value Date   TRIG 441* 10/29/2013   Lab Results  Component Value Date   CHOLHDL 5.8 10/29/2013   No results found for this basename: LDLDIRECT      Radiology:  Dg Chest 2 View  11/14/2013   CLINICAL DATA:  Fall, dizziness, coronary artery disease post CABG, hypertension, diabetes  EXAM: CHEST  2 VIEW  COMPARISON:  09/13/2013  FINDINGS: Normal heart size post CABG.  Atherosclerotic calcification aorta.  Mediastinal contours and pulmonary vascularity normal.  Few scattered calcified pleural plaques bilaterally.  Lungs clear.  No pleural effusion or pneumothorax.  No acute osseous findings.  IMPRESSION: Post CABG.  Question prior asbestos exposure.  No acute abnormalities.   Electronically Signed   By: Lavonia Dana M.D.   On: 11/14/2013 18:26   Ct Head Wo Contrast  11/14/2013   CLINICAL DATA:  Fall.  Headache and neck pain.  EXAM: CT  HEAD WITHOUT CONTRAST  CT CERVICAL SPINE WITHOUT CONTRAST  TECHNIQUE: Multidetector CT imaging of the head and cervical spine was performed following the standard protocol without intravenous contrast. Multiplanar CT image reconstructions of the cervical spine were also generated.  COMPARISON:  CT HEAD W/O CM dated 04/02/2012  FINDINGS: CT HEAD FINDINGS  There is new hypoattenuation involving medial right frontal lobe cortex and Golla matter. Adjacent, separate focus of hypoattenuation slightly more laterally in the right frontal lobe is unchanged and compatible with an old infarct. A cavum septum pellucidum et vergae is again noted. No acute intracranial hemorrhage is seen. Patchy periventricular Becker matter hypodensities are similar to the prior study and compatible with mild to moderate chronic small vessel ischemic disease. A remote lacunar infarct is again seen in the left caudate. There is no evidence of mass, midline shift, or extra-axial fluid collection. Mild generalized cerebral atrophy is unchanged. Atherosclerotic intracranial vertebral ordering carotid siphon calcification is noted. Prior left cataract surgery is noted. The mastoid air  cells and visualized paranasal sinuses are clear.  CT CERVICAL SPINE FINDINGS  There is grade 1 anterolisthesis of C3 on C4, likely due to facet seen. There is fusion of the C6 and C7 vertebral bodies. Prevertebral soft tissues are within normal limits. No acute cervical spine fracture is seen. Moderate to severe multilevel facet arthrosis is noted, left greater than right and most severe from C2-3 to C4-5. Mild-to-moderate multilevel spondylosis is present, greatest at C5-6. Pleural thickening/ scarring is noted in the right lung apex. Atherosclerotic calcification is present at the right greater than left carotid bifurcations. Old bilateral posterior first rib fractures are noted.  IMPRESSION: 1. New hypodensity in the medial right frontal lobe, concerning for acute to  subacute infarct. 2. No evidence of intracranial hemorrhage. 3. No evidence of acute osseous abnormality in the cervical spine. These results were called by telephone at the time of interpretation on 11/14/2013 at 5:42 PM to Dr. Cleatrice Burke , who verbally acknowledged these results.   Electronically Signed   By: Logan Bores   On: 11/14/2013 17:43   Ct Cervical Spine Wo Contrast  11/14/2013   CLINICAL DATA:  Fall.  Headache and neck pain.  EXAM: CT HEAD WITHOUT CONTRAST  CT CERVICAL SPINE WITHOUT CONTRAST  TECHNIQUE: Multidetector CT imaging of the head and cervical spine was performed following the standard protocol without intravenous contrast. Multiplanar CT image reconstructions of the cervical spine were also generated.  COMPARISON:  CT HEAD W/O CM dated 04/02/2012  FINDINGS: CT HEAD FINDINGS  There is new hypoattenuation involving medial right frontal lobe cortex and Marcel matter. Adjacent, separate focus of hypoattenuation slightly more laterally in the right frontal lobe is unchanged and compatible with an old infarct. A cavum septum pellucidum et vergae is again noted. No acute intracranial hemorrhage is seen. Patchy periventricular Buskirk matter hypodensities are similar to the prior study and compatible with mild to moderate chronic small vessel ischemic disease. A remote lacunar infarct is again seen in the left caudate. There is no evidence of mass, midline shift, or extra-axial fluid collection. Mild generalized cerebral atrophy is unchanged. Atherosclerotic intracranial vertebral ordering carotid siphon calcification is noted. Prior left cataract surgery is noted. The mastoid air cells and visualized paranasal sinuses are clear.  CT CERVICAL SPINE FINDINGS  There is grade 1 anterolisthesis of C3 on C4, likely due to facet seen. There is fusion of the C6 and C7 vertebral bodies. Prevertebral soft tissues are within normal limits. No acute cervical spine fracture is seen. Moderate to severe multilevel  facet arthrosis is noted, left greater than right and most severe from C2-3 to C4-5. Mild-to-moderate multilevel spondylosis is present, greatest at C5-6. Pleural thickening/ scarring is noted in the right lung apex. Atherosclerotic calcification is present at the right greater than left carotid bifurcations. Old bilateral posterior first rib fractures are noted.  IMPRESSION: 1. New hypodensity in the medial right frontal lobe, concerning for acute to subacute infarct. 2. No evidence of intracranial hemorrhage. 3. No evidence of acute osseous abnormality in the cervical spine. These results were called by telephone at the time of interpretation on 11/14/2013 at 5:42 PM to Dr. Cleatrice Burke , who verbally acknowledged these results.   Electronically Signed   By: Logan Bores   On: 11/14/2013 17:43   Mr Jodene Nam Head Wo Contrast  11/14/2013   CLINICAL DATA:  Weakness with fall. Headache and confusion. Possible stroke. Abnormal head CT.  EXAM: MRI HEAD WITHOUT CONTRAST  MRA HEAD WITHOUT CONTRAST  TECHNIQUE: Multiplanar, multiecho pulse sequences of the brain and surrounding structures were obtained without intravenous contrast. Angiographic images of the head were obtained using MRA technique without contrast.  COMPARISON:  Head CT same day.  FINDINGS: MRI HEAD FINDINGS  There is a 6 cm region of acute/subacute infarction in the right frontal lobe with swelling. T1 hyperintense material is present in the region consistent with laminar necrosis or low level blood products. This is further evidence of more of a subacute timing. No frank hematoma. . No midline shift. There is an older acute scratched that there is an old or infarction lateral to this region in the frontal lobe with atrophy and gliosis.  There are chronic small vessel changes throughout the pons. No cerebellar insult. There are old small vessel infarctions in the thalami, basal ganglia and throughout the cerebral hemispheric Willette matter. No hydrocephalus.  No sign of neoplastic mass lesion. No extra-axial collection. No pituitary mass. No inflammatory sinus disease.  MRA HEAD FINDINGS  Both internal carotid arteries are widely patent into the brain. No siphon stenosis. On the left, the anterior and middle cerebral arteries are widely patent. On the right, the middle cerebral artery appears widely patent and normal proximally. The anterior cerebral artery is small but does show flow. There is distal vessel atherosclerotic irregularity diffusely.  Both vertebral arteries are patent to the basilar. No basilar stenosis. Posterior circulation branch vessels are patent. There is focal stenosis in the left PCA 1.3 cm beyond its origin.  IMPRESSION: Subacute infarction in the right frontal lobe. Swelling, laminar necrosis and low level blood product deposition. No frank hematoma.  Older right frontal cortical infarction adjacent to that.  Chronic small vessel changes elsewhere throughout the brain.  Distal vessel atherosclerotic changes diffusely. The right anterior cerebral artery is patent but is smaller than the left.   Electronically Signed   By: Nelson Chimes M.D.   On: 11/14/2013 19:48   Mr Brain Wo Contrast  11/14/2013   CLINICAL DATA:  Weakness with fall. Headache and confusion. Possible stroke. Abnormal head CT.  EXAM: MRI HEAD WITHOUT CONTRAST  MRA HEAD WITHOUT CONTRAST  TECHNIQUE: Multiplanar, multiecho pulse sequences of the brain and surrounding structures were obtained without intravenous contrast. Angiographic images of the head were obtained using MRA technique without contrast.  COMPARISON:  Head CT same day.  FINDINGS: MRI HEAD FINDINGS  There is a 6 cm region of acute/subacute infarction in the right frontal lobe with swelling. T1 hyperintense material is present in the region consistent with laminar necrosis or low level blood products. This is further evidence of more of a subacute timing. No frank hematoma. . No midline shift. There is an older acute  scratched that there is an old or infarction lateral to this region in the frontal lobe with atrophy and gliosis.  There are chronic small vessel changes throughout the pons. No cerebellar insult. There are old small vessel infarctions in the thalami, basal ganglia and throughout the cerebral hemispheric Woodell matter. No hydrocephalus. No sign of neoplastic mass lesion. No extra-axial collection. No pituitary mass. No inflammatory sinus disease.  MRA HEAD FINDINGS  Both internal carotid arteries are widely patent into the brain. No siphon stenosis. On the left, the anterior and middle cerebral arteries are widely patent. On the right, the middle cerebral artery appears widely patent and normal proximally. The anterior cerebral artery is small but does show flow. There is distal vessel atherosclerotic irregularity diffusely.  Both vertebral arteries are patent to  the basilar. No basilar stenosis. Posterior circulation branch vessels are patent. There is focal stenosis in the left PCA 1.3 cm beyond its origin.  IMPRESSION: Subacute infarction in the right frontal lobe. Swelling, laminar necrosis and low level blood product deposition. No frank hematoma.  Older right frontal cortical infarction adjacent to that.  Chronic small vessel changes elsewhere throughout the brain.  Distal vessel atherosclerotic changes diffusely. The right anterior cerebral artery is patent but is smaller than the left.   Electronically Signed   By: Nelson Chimes M.D.   On: 11/14/2013 19:48    EKG:  NSR, septal MI, no ST segment changes  ASSESSMENT: h/o atrial flutter, CVA, CAD  PLAN:  1) I have not seen any atrial flutter documented.  Would like to check with Dr. Martinique if he knows when the AFlutter occurred.  Assuming that the atrial flutter is real, his stroke risk would be decreased with anticoagulation.  He is willing to try to decrease the risk of falling by using a cane. If he has his knee surgery, he thinks he'll be more stable  after that. He is working with physical therapy as well to come up with ways to prevent falling. Given his relatively young age, I think anticoagulation would be helpful. Coumadin would be an option. If he has not had atrial fibrillation, I am not sure that one of the novel oral anticoagulant would be an option. We can check with pharmacy whether any of these medicines are indicated for atrial flutter. I would leave the timing of starting anticoagulation to neurology based on the ages of his strokes.  2) no angina. He'll need long-term followup with Dr. Peter Martinique.  Jettie Booze., MD  11/15/2013  4:36 PM

## 2013-11-15 NOTE — H&P (Signed)
FMTS Attending Note  I personally saw and evaluated the patient. The plan of care was discussed with the resident team. I agree with the assessment and plan as documented by the resident.   68 year old male with past medical history of CAD status post CABG, hyperlipidemia, hypertension, history of seizure disorder, depression, history of alcohol abuse, type 2 diabetes, and renal insufficiency presents after a fall at home. 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 Workup in the [facility] long emergency room included CT head which identified hypodensity in the medial right frontal lobe and MRI of the brain which identified new subacute stroke.  Vitals: Reviewed General: Pleasant Caucasian male, no acute distress HEENT: Normocephalic, pupils are equal round and reactive to light, extraocular movements are intact, no scleral icterus, nasal septum midline, no rhinorrhea, moist mucous members, uvula midline, neck was supple, no anterior or posterior cervical lymphadenopathy Cardiac: Regular in rhythm, S1 and S2 present, no murmurs, no heaves or thrills Respiratory: Clear to auscultation bilaterally, normal effort Abdomen: Soft, nontender, bowel sounds present Extremities: Trace bilateral pedal edema, 2+ radial pulses bilaterally Neuro: Cranial nerves II through XII intact, alert and oriented x3, strength was 5 of 5 in all extremities, sensation to light touch was intact in all extremities, pronator drift unremarkable, patient was able to perform bilateral finger-nose testing with a small intention tremor, bilateral biceps reflexes were 2+ MSK: No left knee  erythema or edema noted  Reviewed lab work and image  Assessment and plan: 68 year-old male admitted for subacute stroke. 1. CVA-agree with resident plan for repeat echocardiogram, carotid Dopplers, permissive hypertension, patient be started back on his home aspirin 325 mg and Lipitor, discuss with neurology need for transition to Plavix 2. Follow home-no evidence of acute intracranial bleed on CT scan, no external lacerations or abrasions noted, agree with PT OT for evaluation of gait 3. Acute kidney injury-agree with fluid resuscitation as outlined in the resident plan 4. Hypertension-agree with permissive hypertension, restart on home losartan and nebivolol in the next 24 hours 5. Type 2 diabetes-agree with insulin sliding scale while patient is n.p.o., resume home medications once patient has passed all screen and is taking by mouth 6. Hyperlipidemia-agree with restarting home Lipitor 7. Left knee osteoarthritis-continue Tylenol #3 when necessary, agree with holding ibuprofen in light of AKI  Dossie Arbour MD

## 2013-11-15 NOTE — Progress Notes (Signed)
VASCULAR LAB PRELIMINARY  PRELIMINARY  PRELIMINARY  PRELIMINARY  Carotid duplex  completed.    Preliminary report:  Bilateral:  1-39% ICA stenosis.  Vertebral artery flow is antegrade.      Beckham Buxbaum, RVT 11/15/2013, 3:51 PM

## 2013-11-15 NOTE — Progress Notes (Signed)
FMTS Attending Daily Note: Dawanna Grauberger MD [phone] pager office [phone] I  have seen and examined this patient, reviewed their chart. I have discussed this patient with the resident. I agree with the resident's findings, assessment and care plan. 

## 2013-11-16 DIAGNOSIS — N19 Unspecified kidney failure: Secondary | ICD-10-CM

## 2013-11-16 DIAGNOSIS — Z8669 Personal history of other diseases of the nervous system and sense organs: Secondary | ICD-10-CM

## 2013-11-16 LAB — CBC
HEMATOCRIT: 45.2 % (ref 39.0–52.0)
HEMOGLOBIN: 15.3 g/dL (ref 13.0–17.0)
MCH: 33.6 pg (ref 26.0–34.0)
MCHC: 33.8 g/dL (ref 30.0–36.0)
MCV: 99.3 fL (ref 78.0–100.0)
Platelets: 222 10*3/uL (ref 150–400)
RBC: 4.55 MIL/uL (ref 4.22–5.81)
RDW: 14.9 % (ref 11.5–15.5)
WBC: 9.9 10*3/uL (ref 4.0–10.5)

## 2013-11-16 LAB — GLUCOSE, CAPILLARY
Glucose-Capillary: 138 mg/dL — ABNORMAL HIGH (ref 70–99)
Glucose-Capillary: 135 mg/dL — ABNORMAL HIGH (ref 70–99)
Glucose-Capillary: 87 mg/dL (ref 70–99)
Glucose-Capillary: 121 mg/dL — ABNORMAL HIGH (ref 70–99)

## 2013-11-16 MED ORDER — SODIUM CHLORIDE 0.9 % IV SOLN: INTRAVENOUS | Status: DC

## 2013-11-16 MED ORDER — SODIUM CHLORIDE 0.9 % IV SOLN
INTRAVENOUS | Status: DC
  Administered 2013-11-17: 14:00:00 via INTRAVENOUS

## 2013-11-16 NOTE — Evaluation (Signed)
Occupational Therapy Evaluation Patient Details Name: [name] MRN: [id number] DOB: 10-22-1945 Today's Date: 11/16/2013    History of Present Illness [name] is a 68 y.o. male presenting with subacute stroke . PMH is significant for CAD s/p CABG, HLD, HTN, h/o sz d/o, h/o a flutter, depression, h/o alcohol abuse, DM, and AKI. CT head with hypodensity in medial R frontal lobe, MRI with subacute infarction right frontal lobe   Clinical Impression   Pt is Mod I with ADLs and ADL mobility safety. No further acute OT services indicated at this time, OT will sign off    Follow Up Recommendations  No OT follow up    Equipment Recommendations  None recommended by OT    Recommendations for Other Services       Precautions / Restrictions Precautions Precautions: Fall Restrictions Weight Bearing Restrictions: No      Mobility Bed Mobility: NT, General bed mobility comments: pt up in recliner                Transfers Overall transfer level: Modified independent Equipment used: Rolling walker (2 wheeled)             General transfer comment: used RW as precaution due to left  knee instability    Balance Overall balance assessment: Needs assistance Sitting-balance support: No upper extremity supported;Feet supported Sitting balance-Leahy Scale: Good     Standing balance support: No upper extremity supported;During functional activity Standing balance-Leahy Scale: Good                      ADL  Mod I                         Vision  wears glasses for reading                   Perception Perception Perception Tested?: No   Praxis Praxis Praxis tested?: Not tested    Pertinent Vitals/Pain No c/o pain     Hand Dominance Left   Extremity/Trunk Assessment Upper Extremity Assessment Upper Extremity Assessment: Overall WFL for tasks assessed   Lower Extremity Assessment Lower Extremity Assessment: Defer to PT  evaluation       Communication Communication Communication: No difficulties   Cognition Arousal/Alertness: Awake/alert Behavior During Therapy: WFL for tasks assessed/performed Overall Cognitive Status: Impaired/Different from baseline                                 Home Living Family/patient expects to be discharged to:: Private residence Living Arrangements: Spouse/significant other;Other (Comment) Available Help at Discharge: Family Type of Home: House Home Access: Stairs to enter Technical brewer of Steps: 5 Entrance Stairs-Rails: Right;Left Home Layout: Multi-level Alternate Level Stairs-Number of Steps: flight to basement   Bathroom Shower/Tub: Teacher, early years/pre: Standard     Home Equipment: None      Lives With: Spouse    Prior Functioning/Environment Level of Independence: Independent             OT Diagnosis:     OT Problem List:     OT Treatment/Interventions:      OT Goals(Current goals can be found in the care plan section) Acute Rehab OT Goals Patient Stated Goal: go home  OT Goal Formulation: With patient  OT Frequency:     Barriers to D/C:  none  End of Session: Equipment Utilized During Treatment: Rolling walker  Activity Tolerance: Patient tolerated treatment well Patient left: in chair;with call bell/phone within reach;with chair alarm set   Time: 2257-5051 OT Time Calculation (min): 21 min Charges:  OT General Charges $OT Visit: 1 Procedure OT Evaluation $Initial OT Evaluation Tier I: 1 Procedure OT Treatments $Therapeutic Activity: 8-22 mins G-Codes:    Britt Bottom 11/16/2013, 2:02 PM

## 2013-11-16 NOTE — Progress Notes (Signed)
Family Medicine Teaching Service Daily Progress Note Intern Pager: [phone]  Patient name: [name] Medical record number: [id number] Date of birth: December 19, 1945 Age: 68 y.o. Gender: male  Primary Care Provider: [name], MD Consultants: neuro  Code Status: full  Pt Overview and Major Events to Date:  3/23 - admitted with subacute stroke  Assessment and Plan: [name] is a 68 y.o. male presenting with subacute stroke . PMH is significant for CAD s/p CABG, HLD, HTN, h/o sz d/o, h/o a flutter, depression, h/o alcohol abuse, DM, and AKI.   # Subacute stroke - CT head with hypodensity in medial R frontal lobe, MRI with subacute infarction right frontal lobe with low level blood product deposition. Recent driving changes (more slowly, stopped 3 times by police, more sudden turns in front of traffic) may be stepwise change from vascular dementia. UDS only pos for opiates, blood alc neg, and UA only pos for glucose.   - Permissive HTN. BP in ED 112-160/65-86.  - Echo 03/01/13 with EF 55-60, mod LVH, grade I diastolic dysfunction. Repeat normal without diastolic dysfunction - AM EKG and telemetry normal  - Carotid doppler normal - A1c - 6.3 on 6/17. Reportedly 8.1 in 8/14. 6.8 here  - Lipid panel fasting recently collected 10/29/13 - chol 209, TG 441, HDL 36, LDL not calc. Discussed high chol at recent ofc visit and pt did not want to add fibrate to lipitor 80 he was already taking.  - PT/OT/ SLP consulted, needs HH PT, maybe OT  - Neuro checks q2  - continue ASA for secondary prevention  - Neuro consulted - appreciate recs.  - Plan for TEE and then loop recorder tomorrow to evaluate for atrial arrhythmia  # Fall hitting head - Seems more likely related to knee symptoms/ locking up than stroke. CT neg for acute bleed or c-spine injury.  - PT / OT consult for gait.  - PT rec home health PT, still waiting for OT to see   # Mild leukocytosis - Monitor.  - Dowtrending, today's CBC  pending  # AKI - Resolved   # HTN - Stable. 100s-140s SBP - Allow permissive HTN.  - Hold lasix prn and losartan and continue nebivolol.   # CAD hx, CABG 2005, stress test 10/26/12 showing low risk with small area of decreased uptake at base of inferior wall; EF 62%  - Continue aspirin 325  - Continue nebivolol 71m BID   # Depression:  - Not taking cymbalta 60 because feels it does not work.  - Sees psychiatrist 4/20.  - rec another antidepressant on d/c   # DM: A1c 6.8  - Hold januvia.  - SSI sn.  - Continue reglan   # HLD - Values listed above.  - Continue home lipitor 80.   # Arthritis  - Continue tylenol #3 prn, hold ibuprofen given mild AKI, and continue robaxin but decr to 5080mTID.   FEN/GI: heart healthy / carb mod diet. SLIV Prophylaxis: lovenox  Disposition: home maybe tomorrow after TEE  Subjective: feeling fine, NAEON, amenable to coumadin if needed but had lots of questions about monitoring which I clarified with him  Objective: Temp:  [97.5 F (36.4 C)-98.4 F (36.9 C)] 98.1 F (36.7 C) (03/24 0754) Pulse Rate:  [68-90] 68 (03/24 0754) Resp:  [16-19] 16 (03/24 0754) BP: (106-142)/(66-83) 129/83 mmHg (03/24 0754) SpO2:  [90 %-98 %] 98 % (03/24 0754) Physical Exam: General: NAD, cooperative  HEENT: AT/[place], sclera clear,  MMM  Cardiovascular: RRR, no m/r/g, though distant; 1+ bilateral DP pulses  Respiratory: CTAB, normal effort  Abdomen: Soft, mildly distended, nontender, NABS  Extremities: No LE edema or calf tendernress; left knee medial joint line mild tenderness  Skin: no rash or cyanosis  Neuro: Awake, alert, no focal deficits  Laboratory:  Recent Labs Lab 11/14/13 1808 11/15/13 0639 11/16/13 0930  WBC 12.4* 11.7* 9.9  HGB 17.5* 15.8 15.3  HCT 52.0 46.6 45.2  PLT 272 243 222    Recent Labs Lab 11/14/13 1808 11/15/13 0639  NA 139 141  K 4.1 4.0  CL 96 102  CO2 27 23  BUN 26* 26*  CREATININE 1.14 0.95  CALCIUM 10.2 9.4  PROT  8.8*  --   BILITOT 0.5  --   ALKPHOS 73  --   ALT 25  --   AST 36  --   GLUCOSE 147* 158*   UDS: opiates  UA: 100 glucose, otherwise negative  Blood alcohol: Neg  poc trop neg   Imaging/Diagnostic Tests: CT head and c-spine w/o contrast: IMPRESSION:  1. New hypodensity in the medial right frontal lobe, concerning for acute to subacute infarct.  2. No evidence of intracranial hemorrhage.  3. No evidence of acute osseous abnormality in the cervical spine.   MRI/A head: IMPRESSION:  Subacute infarction in the right frontal lobe. Swelling, laminar necrosis and low level blood product deposition. No frank hematoma. Older right frontal cortical infarction adjacent to that. Chronic small vessel changes elsewhere throughout the brain. Distal vessel atherosclerotic changes diffusely. The right anterior cerebral artery is patent but is smaller than the left.   DG chest 2 view: IMPRESSION:  Post CABG. Question prior asbestos exposure. No acute abnormalities.   EKG: Sinus rhythm with anteroseptal infarct, age 2.  Echo: Left ventricle: The cavity size was normal. Systolic function was normal. The estimated ejection fraction was in the range of 55% to 60%. Wall motion was normal; there were no regional wall motion abnormalities.  Carotid Dopplers: Bilateral: 1-39% ICA stenosis. Vertebral artery flow is antegrade.    Beverlyn Roux, MD 11/16/2013, 1:16 PM PGY-1, [place] Intern pager: [phone], text pages welcome

## 2013-11-16 NOTE — Progress Notes (Signed)
Stroke Team Progress Note  HISTORY [name] is an 68 y.o. male history of atrial flutter, hypertension, hyperlipidemia, diabetes mellitus and seizures, who was brought to the emergency room 03/22/2015for evaluation because his wife felt he experienced a change in mental status and wasn't acting himself. She noticed that he was somewhat less alert than usual and wanted to sleep more than usual. He suffered a fall yesterday that he attributes to his left knee buckling while going down a set of steps. He did not notice any weakness of his left lower extremity at any point before or after his fall. CT scan and MRI showed findings indicative of acute to subacute right frontal infarction. Blood breakdown products were noted. No frank hematoma noted, however. NIH stroke score was 0. Patient has been complaining of headache but tends to have chronic headaches with no real change in the past several days. Patient has been taking aspirin daily. Patient was not administerd TPA secondary to unknown last known well. He was admitted for further evaluation and treatment.  SUBJECTIVE No family present at bedside.  OBJECTIVE Most recent Vital Signs: Filed Vitals:   11/15/13 2355 11/16/13 0200 11/16/13 0500 11/16/13 0754  BP: 106/71 109/66 142/80 129/83  Pulse: 78 83 88 68  Temp: 98.2 F (36.8 C) 98.1 F (36.7 C) 97.5 F (36.4 C) 98.1 F (36.7 C)  TempSrc:    Oral  Resp: 18 16 18 16   Height:      Weight:      SpO2: 90% 95% 96% 98%   CBG (last 3)   Recent Labs  11/15/13 1647 11/15/13 2122 11/16/13 0753  GLUCAP 145* 87 138*    IV Fluid Intake:      MEDICATIONS  . aspirin  300 mg Rectal Daily   Or  . aspirin EC  325 mg Oral Daily  . atorvastatin  80 mg Oral Daily  . enoxaparin (LOVENOX) injection  40 mg Subcutaneous Q24H  . insulin aspart  0-9 Units Subcutaneous TID WC  . methocarbamol  500 mg Oral 3 times per day  . metoCLOPramide  5 mg Oral TID AC & HS  . nebivolol  20 mg Oral BID  .  pantoprazole  40 mg Oral Daily  . traZODone  150 mg Oral QHS   PRN:  acetaminophen-codeine, senna-docusate  Diet:     Activity:  Bedrest, OOB with assistance, ambulate patient DVT Prophylaxis:  Lovenox 40 mg sq daily   CLINICALLY SIGNIFICANT STUDIES Basic Metabolic Panel:   Recent Labs Lab 11/14/13 1808 11/15/13 0639  NA 139 141  K 4.1 4.0  CL 96 102  CO2 27 23  GLUCOSE 147* 158*  BUN 26* 26*  CREATININE 1.14 0.95  CALCIUM 10.2 9.4   Liver Function Tests:   Recent Labs Lab 11/14/13 1808  AST 36  ALT 25  ALKPHOS 73  BILITOT 0.5  PROT 8.8*  ALBUMIN 4.4   CBC:   Recent Labs Lab 11/14/13 1808 11/15/13 0639  WBC 12.4* 11.7*  NEUTROABS 9.4*  --   HGB 17.5* 15.8  HCT 52.0 46.6  MCV 98.3 99.1  PLT 272 243   Coagulation:   Recent Labs Lab 11/14/13 1808  LABPROT 13.1  INR 1.01   Cardiac Enzymes: No results found for this basename: CKTOTAL, CKMB, CKMBINDEX, TROPONINI,  in the last 168 hours Urinalysis:   Recent Labs Lab 11/14/13 1723  COLORURINE YELLOW  LABSPEC 1.015  PHURINE 5.5  GLUCOSEU [place]  KETONESUR NEGATIVE  PROTEINUR NEGATIVE  UROBILINOGEN 0.2  NITRITE NEGATIVE  LEUKOCYTESUR NEGATIVE   Lipid Panel    Component Value Date/Time   CHOL 209* 10/29/2013 1611   TRIG 441* 10/29/2013 1611   HDL 36* 10/29/2013 1611   CHOLHDL 5.8 10/29/2013 1611   VLDL NOT CALC 10/29/2013 1611   LDLCALC NOT CALC 10/29/2013 1611   HgbA1C  Lab Results  Component Value Date   HGBA1C 6.8* 11/15/2013    Urine Drug Screen:     Component Value Date/Time   LABOPIA POSITIVE* 11/14/2013 1723   LABOPIA POSITIVE* 04/03/2012 0910   COCAINSCRNUR NONE DETECTED 11/14/2013 1723   COCAINSCRNUR NEGATIVE 04/03/2012 0910   LABBENZ NONE DETECTED 11/14/2013 1723   LABBENZ NEGATIVE 04/03/2012 0910   AMPHETMU NONE DETECTED 11/14/2013 1723   AMPHETMU NEGATIVE 04/03/2012 0910   THCU NONE DETECTED 11/14/2013 1723   LABBARB NONE DETECTED 11/14/2013 1723     Alcohol Level:   Recent Labs Lab 11/14/13 1808  ETH <11    CT Cervical Spine 11/14/2013   1. New hypodensity in the medial right frontal lobe, concerning for acute to subacute infarct. 2. No evidence of intracranial hemorrhage. 3. No evidence of acute osseous abnormality in the cervical spine.    CT of the brain  11/14/2013   1. New hypodensity in the medial right frontal lobe, concerning for acute to subacute infarct. 2. No evidence of intracranial hemorrhage. 3. No evidence of acute osseous abnormality in the cervical spine.   MRI of the brain  11/14/2013    Subacute infarction in the right frontal lobe. Swelling, laminar necrosis and low level blood product deposition. No frank hematoma.  Older right frontal cortical infarction adjacent to that.  Chronic small vessel changes elsewhere throughout the brain.    MRA of the brain  11/14/2013   Distal vessel atherosclerotic changes diffusely. The right anterior cerebral artery is patent but is smaller than the left.     2D Echocardiogram  EF 55-60% with no source of embolus.   Carotid Doppler  No evidence of hemodynamically significant internal carotid artery stenosis. Vertebral artery flow is antegrade.   CXR  11/14/2013   Post CABG.  Question prior asbestos exposure.  No acute abnormalities.     EKG  normal sinus rhythm. For complete results please see formal report.   Therapy Recommendations   Physical Exam   Middle aged male not in distress.Awake alert. Afebrile. Head is nontraumatic. Neck is supple without bruit. Hearing is normal. Cardiac exam no murmur or gallop. Lungs are clear to auscultation. Distal pulses are well felt. Neurological Exam ;   Mental Status:  Alert, oriented, thought content appropriate. Speech fluent without evidence of aphasia. Able to follow commands without difficulty.  Cranial Nerves:  II-Visual fields were normal.  III/IV/VI-Pupils were equal and reacted. Extraocular movements were full and conjugate.   V/VII-no facial numbness and no facial weakness.  VIII-normal.  X-normal speech and symmetrical palatal movement.  Motor: 5/5 bilaterally with normal tone and bulk  Sensory: Normal throughout.  Deep Tendon Reflexes: 1+ and symmetric.  Plantars: Flexor bilaterally  Cerebellar: Normal finger-to-nose testing.   ASSESSMENT [name] is a 68 y.o. male presenting with a change in mental status "not acting right". Imaging confirms a right frontal infarct in this setting of an old right ACA infarct. Infarcts felt to be embolic secondary to unknown etiology, ? Atrial flutter per previous notes.  On aspirin 325 mg orally every day prior to admission. Now  on aspirin 325 mg orally every day for secondary stroke prevention. Stroke work up underway.   hypertension Hyperlipidemia, LDL unable to calculate, on lipitor 80 mg daily PTA, now on Lipitor 80 mg, goal LDL < 100  Diabetes, HgbA1c 6.8,goal < 7.0 Hx atrial flutter, Dr Irish Lack cannot confirm atrial flutter, he plans to speak with Dr. [place] to further clarify Hx etoh use obstructive sleep apnea, does not use CPAP CAD  Hx seizure d/o  Hospital day # 2  TREATMENT/PLAN  Continue aspirin po or suppository daily for secondary stroke prevention.    Atrial fibrillation a real diagnosis, patient will need anticoagulation long-term. If it does not a real diagnosis, patient will need TEE and loop recorder to evaluate for atrial fibrillation as embolic source. From the stroke standpoint, anticoagulation is not recommended without a confirmed diagnosis.  Dr. Leonie Man discussed diagnosis, prognosis,  treatment options and plan of care with family practice resident.    Burnetta Sabin, MSN, RN, ANVP-BC, AGPCNP-BC [facility] Pager: [phone] 11/16/2013 10:16 AM  I have personally obtained a history, examined the patient, evaluated imaging results, and formulated the assessment and plan of care. I agree with the above.  Antony Contras, MD   To contact Stroke Continuity provider, please refer to [email or web address] After hours, contact General Neurology

## 2013-11-16 NOTE — Care Management Note (Signed)
    Page 1 of 2   11/17/2013     11:33:59 AM   CARE MANAGEMENT NOTE 11/17/2013  Patient:  [name], [name]   Account Number:  [id number]  Date Initiated:  11/16/2013  Documentation initiated by:  Marvetta Gibbons  Subjective/Objective Assessment:   Pt admitted with fall- found to have a subacute stroke     Action/Plan:   PTA pt lived at home with wife- PT/OT/ST evals   Anticipated DC Date:  11/17/2013   Anticipated DC Plan:  [facility]  CM consult      [facility] Choice  HOME HEALTH   Choice offered to / List presented to:  C-1 Patient        [place] arranged  HH-1 RN  [place].   Status of service:  Completed, signed off Medicare Important Message given?   (If response is "NO", the following Medicare IM given date fields will be blank) Date Medicare IM given:   Date Additional Medicare IM given:    Discharge Disposition:  [place]  Per UR Regulation:  Reviewed for med. necessity/level of care/duration of stay  If discussed at [place] of Stay Meetings, dates discussed:    Comments:  11/17/13- 1115- Marvetta Gibbons RN, BSN [phone] F/u done with pt regarding [place] agency of choice- per pt he and his wife decided that they would like to use [facility] for [facility] services- pt has procedure at 230 this afternoon then possible d/c if test normal- referral called to Butch Penny with [facility] for HH-RN/PT/SLP- services to begin within 24-48 hr post discharge.  11/16/13- [facility] RN, BSN [phone] Referral received for [facility]- orders in for HH-RN/PT/ST- in to speak with pt at bedside- per conversation pt states that he  is very familiar with [facility] services but has never had them himself- list of [place] agencies for [facility] given to pt- pt states that he would like to speak with wife before making choice- CM will check with pt tomorrow regarding [place] agency of choice and then make  appropriate referral for services.

## 2013-11-16 NOTE — ED Provider Notes (Signed)
Medical screening examination/treatment/procedure(s) were conducted as a shared visit with non-physician practitioner(s) and myself.  I personally evaluated the patient during the encounter.   EKG Interpretation   Date/Time:  Sunday November 14 2013 18:09:31 EDT Ventricular Rate:  84 PR Interval:  166 QRS Duration: 98 QT Interval:  368 QTC Calculation: 435 R Axis:   90 Text Interpretation:  Sinus rhythm Probable left atrial enlargement  Anteroseptal infarct, age indeterminate Baseline wander in lead(s) II III  aVF ED PHYSICIAN INTERPRETATION AVAILABLE IN [facility] Confirmed by  TEST, Record ([id number]) on 11/16/2013 10:05:33 AM      Pt with unsteadiness, fall earlier, ?whether knee gave way. Also reports generally feeling weak.  Labs. Ct.    Mirna Mires, MD 11/16/13 1116

## 2013-11-16 NOTE — Progress Notes (Addendum)
     SUBJECTIVE: No complaints.   BP 129/83  Pulse 68  Temp(Src) 98.1 F (36.7 C) (Oral)  Resp 16  Ht 5' 7"  (1.702 m)  Wt 207 lb 8 oz (94.121 kg)  BMI 32.49 kg/m2  SpO2 98%  Intake/Output Summary (Last 24 hours) at 11/16/13 1019 Last data filed at 11/15/13 2100  Gross per 24 hour  Intake    700 ml  Output      0 ml  Net    700 ml    PHYSICAL EXAM General: Well developed, well nourished, in no acute distress. Alert and oriented x 3.  Psych:  Good affect, responds appropriately Neck: No JVD. No masses noted.  Lungs: Clear bilaterally with no wheezes or rhonci noted.  Heart: RRR with no murmurs noted. Abdomen: Bowel sounds are present. Soft, non-tender.  Extremities: No lower extremity edema.   LABS: Basic Metabolic Panel:  Recent Labs  11/14/13 1808 11/15/13 0639  NA 139 141  K 4.1 4.0  CL 96 102  CO2 27 23  GLUCOSE 147* 158*  BUN 26* 26*  CREATININE 1.14 0.95  CALCIUM 10.2 9.4   CBC:  Recent Labs  11/14/13 1808 11/15/13 0639  WBC 12.4* 11.7*  NEUTROABS 9.4*  --   HGB 17.5* 15.8  HCT 52.0 46.6  MCV 98.3 99.1  PLT 272 243   Current Meds: . aspirin  300 mg Rectal Daily   Or  . aspirin EC  325 mg Oral Daily  . atorvastatin  80 mg Oral Daily  . enoxaparin (LOVENOX) injection  40 mg Subcutaneous Q24H  . insulin aspart  0-9 Units Subcutaneous TID WC  . methocarbamol  500 mg Oral 3 times per day  . metoCLOPramide  5 mg Oral TID AC & HS  . nebivolol  20 mg Oral BID  . pantoprazole  40 mg Oral Daily  . traZODone  150 mg Oral QHS   ASSESSMENT AND PLAN:  1. CVA with history of atrial flutter: No atrial flutter documented during this admission but he does have this in his history. Will need loop recorder to look for atrial arrythmias. Would recommend anti-coagulation when ok from neurology standpoint. Coumadin would likely be the best choice but will leave this to Neurology team. He is on ASA and prophylaxis dosing of Lovenox at this time. Will plan TEE  in am 11/17/13 followed by loop recorder. I have discussed with EP this am. The EP team will see him tomorrow. Will place TEE orders. NPO at [place].   2. CAD: Stable.   MCALHANY,CHRISTOPHER  3/24/201510:19 AM

## 2013-11-16 NOTE — Progress Notes (Signed)
Physical Therapy Treatment Patient Details Name: [name] MRN: [id number] DOB: Dec 20, 1945 Today's Date: 2013-11-25    History of Present Illness "My knee is going to give out!  It does that"    PT Comments    Pt limited by knee instability today.  Recommend  Use of RW for safety  Follow Up Recommendations  Home health PT     Equipment Recommendations   (pt reports he has a RW at home)    Recommendations for Other Services       Precautions / Restrictions Precautions Precautions: Fall (hx of episodes of knee instability without warning) Restrictions Weight Bearing Restrictions: No    Mobility  Bed Mobility Overal bed mobility: Modified Independent                Transfers Overall transfer level: Modified independent Equipment used: Rolling walker (2 wheeled)             General transfer comment: used RW as precaution due to left  knee instability  Ambulation/Gait Ambulation/Gait assistance: Supervision Ambulation Distance (Feet): 250 Feet Assistive device: Rolling walker (2 wheeled) Gait Pattern/deviations: Step-through pattern Gait velocity: decreased   General Gait Details: at intial gait attempt, pt has episode of left knee pain and instability "my knee is going to buckle, I need to sit down"  Pt returned safely to bed and on next attempt he used a RW.  Pt was able to ambulate well with RW and reported more confidence in knee stability   Stairs            Wheelchair Mobility    Modified Rankin (Stroke Patients Only)       Balance     Sitting balance-Leahy Scale: Good       Standing balance-Leahy Scale: Good                      Cognition Arousal/Alertness: Awake/alert Behavior During Therapy: WFL for tasks assessed/performed Overall Cognitive Status: Within Functional Limits for tasks assessed                      Exercises General Exercises - Lower Extremity Ankle Circles/Pumps: AROM;Both;10  reps;Seated Long Arc Quad: AROM;10 reps;Left;Seated Hip Flexion/Marching: AROM;Left;10 reps;Seated Heel Raises: AROM;Both;10 reps;Seated    General Comments        Pertinent Vitals/Pain Reports intermittent knee pain and instability---did not rate.  Pt also states "I'm trying to get a headache"  He reports he has that every day    Home Living                      Prior Function            PT Goals (current goals can now be found in the care plan section) Progress towards PT goals: Progressing toward goals    Frequency       PT Plan Current plan remains appropriate    End of Session Equipment Utilized During Treatment: Gait belt Activity Tolerance: Patient tolerated treatment well Patient left: in chair;with chair alarm set;with call bell/phone within reach;with nursing/sitter in room     Time: 1030-1056    Charges:  $Gait Training: 8-22 mins $Therapeutic Exercise: 8-22 mins                    G Codes:       11/25/2013, 11:01 AM Donato Heinz. Owens Shark, [place]

## 2013-11-16 NOTE — Evaluation (Signed)
Speech Language Pathology Evaluation Patient Details Name: [name] MRN: [id number] DOB: 21-Dec-1945 Today's Date: 11/16/2013 Time: 1240-1310 SLP Time Calculation (min): 30 min  Problem List:  Patient Active Problem List   Diagnosis Date Noted  . Atrial fibrillation 11/15/2013  . Atrial flutter   . Stroke 11/14/2013  . Cough 06/09/2013  . Pleural plaque consistent with asbestos exposure by CT 10/2012 02/17/2013  . Renal failure 02/13/2013  . Dyspnea 02/11/2013  . Dysphagia, unspecified(787.20) 05/12/2012  . Seizure disorder 04/04/2012  . CAD (coronary artery disease) 06/13/2011  . HTN (hypertension) 06/13/2011  . Diabetes mellitus 06/13/2011  . History of seizure disorder   . History of atrial flutter   . Depression   . Back pain   . Hearing loss   . Arrhythmia   . Diverticulitis   . H/O alcohol abuse    Past Medical History:  Past Medical History  Diagnosis Date  . Coronary artery disease     a. Low level exercise Lex MV 3/14: low risk, EF 62%, inf defect 2/2 diaph attenuation vs artifiact, small area of scar possible, no ischemia  . Hyperlipidemia   . Hypertension   . History of seizure disorder   . History of atrial flutter   . Depression   . Back pain     persistent  . Hearing loss   . Diverticulitis   . H/O alcohol abuse   . Diabetes mellitus     controled by diet  . GERD (gastroesophageal reflux disease)   . Seizures   . Headache(784.0)   . Arthritis     of spine  . Sleep apnea     does not use CPAP  . Hx of echocardiogram     Echo 7/14:  Mod LVH, EF 55-60%, Gr 1 DD, mild MR, PASP 36  . AKI (acute kidney injury)     a. 6/14: resolved after d/c ARB;  b. RA U/S 7/14: no RA stenosis  . Atrial flutter    Past Surgical History:  Past Surgical History  Procedure Laterality Date  . Partial colectomy      for diverticuli  . Orchiectomy    . Umbilical hernia repair    . Cervical fusion    . Coronary artery bypass graft  2005    LIMA graft to  LAD,saphenous vein graft to diag.,circumflex, marginal,and to the RCA  . Cardiac catheterization    . Appendectomy    . Esophagogastroduodenoscopy  02/10/2012    Procedure: ESOPHAGOGASTRODUODENOSCOPY (EGD);  Surgeon: Florencia Reasons, MD;  Location: [facility] ENDOSCOPY;  Service: Endoscopy;  Laterality: N/A;  . Foreign body removal  02/10/2012    Procedure: FOREIGN BODY REMOVAL;  Surgeon: Florencia Reasons, MD;  Location: [facility] ENDOSCOPY;  Service: Endoscopy;  Laterality: N/A;  . Back surgery    . Esophagogastroduodenoscopy  06/29/2012    Procedure: ESOPHAGOGASTRODUODENOSCOPY (EGD);  Surgeon: Vertell Novak., MD;  Location: [facility] ENDOSCOPY;  Service: Endoscopy;  Laterality: N/A;  . Foreign body removal  06/29/2012    Procedure: FOREIGN BODY REMOVAL;  Surgeon: Vertell Novak., MD;  Location: [facility] ENDOSCOPY;  Service: Endoscopy;  Laterality: N/A;  . Savory dilation  06/29/2012    Procedure: SAVORY DILATION;  Surgeon: Vertell Novak., MD;  Location: [facility] ENDOSCOPY;  Service: Endoscopy;  Laterality: N/A;   HPI:  JOANTHONY TETTER is a 68 y.o. male presenting with subacute stroke . PMH is significant for CAD s/p CABG, HLD, HTN, h/o sz d/o, h/o a  flutter, depression, h/o alcohol abuse, DM, and AKI. CT head with hypodensity in medial R frontal lobe, MRI with subacute infarction right frontal lobe.  Assessment / Plan / Recommendation Clinical Impression  Pt presents with subtle, higher-level cognitive-linguistic deficits marked by mild impulsivity and mild deficits in executive functioning.  Pt's wife asserts that her husband is "not quite himself."  Discussed right frontal lobe and its functions.  Pt, wife, and daughter would like a trial of HH SLP to address these deficits.      SLP Assessment  Patient needs continued Speech Lanaguage Pathology Services    Follow Up Recommendations  Home health SLP    Frequency and Duration min 1 x/week  2 weeks      SLP Goals  SLP Goals Potential to Achieve Goals:  Good  SLP Evaluation Prior Functioning  Cognitive/Linguistic Baseline: Within functional limits Type of Home: House  Lives With: Spouse Available Help at Discharge: Family   Cognition  Overall Cognitive Status: Impaired/Different from baseline Arousal/Alertness: Awake/alert Orientation Level: Oriented X4 Attention: Divided Divided Attention: Impaired Divided Attention Impairment: Verbal complex Memory: Appears intact Awareness: Appears intact Problem Solving: Impaired Problem Solving Impairment: Verbal complex Executive Function: Decision Making;Sequencing Sequencing: Impaired Sequencing Impairment: Verbal complex Decision Making: Impaired Decision Making Impairment: Verbal complex Safety/Judgment: Appears intact    Comprehension  Auditory Comprehension Overall Auditory Comprehension: Appears within functional limits for tasks assessed Visual Recognition/Discrimination Discrimination: Within Function Limits Reading Comprehension Reading Status: Within funtional limits    Expression Expression Primary Mode of Expression: Verbal Verbal Expression Overall Verbal Expression: Appears within functional limits for tasks assessed Written Expression Dominant Hand: Left Written Expression: Not tested   Oral / Motor Oral Motor/Sensory Function Overall Oral Motor/Sensory Function: Appears within functional limits for tasks assessed Motor Speech Overall Motor Speech: Appears within functional limits for tasks assessed   Curtina Grills L. Samson Frederic, [place] CCC/SLP Pager [phone]      Blenda Mounts Laurice 11/16/2013, 1:20 PM

## 2013-11-16 NOTE — Progress Notes (Signed)
FMTS Attending Daily Note: Sara Neal MD [phone] pager office [phone] I  have seen and examined this patient, reviewed their chart. I have discussed this patient with the resident. I agree with the resident's findings, assessment and care plan. 

## 2013-11-17 ENCOUNTER — Encounter (HOSPITAL_COMMUNITY): Payer: Self-pay

## 2013-11-17 ENCOUNTER — Encounter (HOSPITAL_COMMUNITY)
Admission: EM | Disposition: A | Discharge status: Home Health | Payer: Self-pay | Source: Home / Self Care | Attending: Family Medicine

## 2013-11-17 DIAGNOSIS — I6350 Cerebral infarction due to unspecified occlusion or stenosis of unspecified cerebral artery: Secondary | ICD-10-CM

## 2013-11-17 DIAGNOSIS — I6789 Other cerebrovascular disease: Secondary | ICD-10-CM

## 2013-11-17 DIAGNOSIS — I4891 Unspecified atrial fibrillation: Secondary | ICD-10-CM

## 2013-11-17 HISTORY — PX: TEE WITHOUT CARDIOVERSION: SHX5443

## 2013-11-17 HISTORY — PX: LOOP RECORDER IMPLANT: SHX5477

## 2013-11-17 LAB — GLUCOSE, CAPILLARY
GLUCOSE-CAPILLARY: 133 mg/dL — AB (ref 70–99)
GLUCOSE-CAPILLARY: 124 mg/dL — AB (ref 70–99)
GLUCOSE-CAPILLARY: 193 mg/dL — AB (ref 70–99)
GLUCOSE-CAPILLARY: 121 mg/dL — AB (ref 70–99)

## 2013-11-17 SURGERY — LOOP RECORDER IMPLANT
Anesthesia: LOCAL

## 2013-11-17 SURGERY — ECHOCARDIOGRAM, TRANSESOPHAGEAL
Anesthesia: Moderate Sedation

## 2013-11-17 MED ORDER — MIDAZOLAM HCL 5 MG/ML IJ SOLN
INTRAMUSCULAR | Status: AC
  Filled 2013-11-17: qty 2

## 2013-11-17 MED ORDER — MIDAZOLAM HCL 10 MG/2ML IJ SOLN
INTRAMUSCULAR | Status: DC | PRN
  Administered 2013-11-17 (×2): 2 mg via INTRAVENOUS

## 2013-11-17 MED ORDER — SERTRALINE HCL 25 MG PO TABS: 25.0000 mg | ORAL_TABLET | Freq: Every day | ORAL | Status: DC

## 2013-11-17 MED ORDER — FENTANYL CITRATE 0.05 MG/ML IJ SOLN
INTRAMUSCULAR | Status: DC | PRN
  Administered 2013-11-17 (×2): 25 ug via INTRAVENOUS

## 2013-11-17 MED ORDER — FENTANYL CITRATE 0.05 MG/ML IJ SOLN
INTRAMUSCULAR | Status: AC
  Filled 2013-11-17: qty 2

## 2013-11-17 MED ORDER — LIDOCAINE-EPINEPHRINE 1 %-1:100000 IJ SOLN
INTRAMUSCULAR | Status: AC
  Filled 2013-11-17: qty 1

## 2013-11-17 MED ORDER — DIPHENHYDRAMINE HCL 50 MG/ML IJ SOLN
INTRAMUSCULAR | Status: AC
  Filled 2013-11-17: qty 1

## 2013-11-17 NOTE — H&P (View-Only) (Signed)
     SUBJECTIVE: No complaints.   BP 129/83  Pulse 68  Temp(Src) 98.1 F (36.7 C) (Oral)  Resp 16  Ht 5' 7"  (1.702 m)  Wt 207 lb 8 oz (94.121 kg)  BMI 32.49 kg/m2  SpO2 98%  Intake/Output Summary (Last 24 hours) at 11/16/13 1019 Last data filed at 11/15/13 2100  Gross per 24 hour  Intake    700 ml  Output      0 ml  Net    700 ml    PHYSICAL EXAM General: Well developed, well nourished, in no acute distress. Alert and oriented x 3.  Psych:  Good affect, responds appropriately Neck: No JVD. No masses noted.  Lungs: Clear bilaterally with no wheezes or rhonci noted.  Heart: RRR with no murmurs noted. Abdomen: Bowel sounds are present. Soft, non-tender.  Extremities: No lower extremity edema.   LABS: Basic Metabolic Panel:  Recent Labs  11/14/13 1808 11/15/13 0639  NA 139 141  K 4.1 4.0  CL 96 102  CO2 27 23  GLUCOSE 147* 158*  BUN 26* 26*  CREATININE 1.14 0.95  CALCIUM 10.2 9.4   CBC:  Recent Labs  11/14/13 1808 11/15/13 0639  WBC 12.4* 11.7*  NEUTROABS 9.4*  --   HGB 17.5* 15.8  HCT 52.0 46.6  MCV 98.3 99.1  PLT 272 243   Current Meds: . aspirin  300 mg Rectal Daily   Or  . aspirin EC  325 mg Oral Daily  . atorvastatin  80 mg Oral Daily  . enoxaparin (LOVENOX) injection  40 mg Subcutaneous Q24H  . insulin aspart  0-9 Units Subcutaneous TID WC  . methocarbamol  500 mg Oral 3 times per day  . metoCLOPramide  5 mg Oral TID AC & HS  . nebivolol  20 mg Oral BID  . pantoprazole  40 mg Oral Daily  . traZODone  150 mg Oral QHS   ASSESSMENT AND PLAN:  1. CVA with history of atrial flutter: No atrial flutter documented during this admission but he does have this in his history. Will need loop recorder to look for atrial arrythmias. Would recommend anti-coagulation when ok from neurology standpoint. Coumadin would likely be the best choice but will leave this to Neurology team. He is on ASA and prophylaxis dosing of Lovenox at this time. Will plan TEE  in am 11/17/13 followed by loop recorder. I have discussed with EP this am. The EP team will see him tomorrow. Will place TEE orders. NPO at [place].   2. CAD: Stable.   [name]  3/24/201510:19 AM

## 2013-11-17 NOTE — Interval H&P Note (Signed)
History and Physical Interval Note:  11/17/2013 2:11 PM  [name]  has presented today for surgery, with the diagnosis of stroke  The various methods of treatment have been discussed with the patient and family. After consideration of risks, benefits and other options for treatment, the patient has consented to  Procedure(s): TRANSESOPHAGEAL ECHOCARDIOGRAM (TEE) (N/A) as a surgical intervention .  The patient's history has been reviewed, patient examined, no change in status, stable for surgery.  I have reviewed the patient's chart and labs.  Questions were answered to the patient's satisfaction.     Peter Daquila [employer]

## 2013-11-17 NOTE — Consult Note (Signed)
ELECTROPHYSIOLOGY CONSULT NOTE   Patient ID: [name] MRN: [id number], DOB/AGE: October 17, 1945   Admit date: 11/14/2013 Date of Consult: 11/17/2013  Primary Physician: Nena Jordan, MD Primary Cardiologist: [place], MD Reason for Consultation: Cryptogenic stroke; recommendations regarding Implantable Loop Recorder  History of Present Illness [name] is a pleasant 68 year old man with CAD s/p CABG 2005, HTN, dyslipidemia, OSA and seizure disorder who was admitted on 11/14/2013 with acute CVA. He has been monitored on telemetry which has demonstrated no arrhythmias. No cause has been identified. Inpatient stroke work-up is to be completed with a TEE. EP has been asked to evaluate for placement of an implantable loop recorder to monitor for atrial fibrillation. Of note, atrial flutter is listed in his medical history; however, I cannot find any record of this and, on review of all available ECGs, none show atrial flutter. He did not have any arrhythmias following bypass surgery according to the DC summary. He has been admitted in the past for confusion, diarrhea and seizures without mention of cardiac arrhythmias during the hospitalization.   Past Medical History Past Medical History  Diagnosis Date  . Coronary artery disease     a. Low level exercise Lex MV 3/14: low risk, EF 62%, inf defect 2/2 diaph attenuation vs artifiact, small area of scar possible, no ischemia  . Hyperlipidemia   . Hypertension   . History of seizure disorder   . History of atrial flutter   . Depression   . Back pain     persistent  . Hearing loss   . Diverticulitis   . H/O alcohol abuse   . Diabetes mellitus     controled by diet  . GERD (gastroesophageal reflux disease)   . Seizures   . Headache(784.0)   . Arthritis     of spine  . Sleep apnea     does not use CPAP  . Hx of echocardiogram     Echo 7/14:  Mod LVH, EF 55-60%, Gr 1 DD, mild MR, PASP 36  . AKI (acute kidney injury)     a. 6/14:  resolved after d/c ARB;  b. RA U/S 7/14: no RA stenosis  . Atrial flutter   . Stroke     Past Surgical History Past Surgical History  Procedure Laterality Date  . Partial colectomy      for diverticuli  . Orchiectomy    . Umbilical hernia repair    . Cervical fusion    . Coronary artery bypass graft  2005    LIMA graft to LAD,saphenous vein graft to diag.,circumflex, marginal,and to the RCA  . Cardiac catheterization    . Appendectomy    . Esophagogastroduodenoscopy  02/10/2012    Procedure: ESOPHAGOGASTRODUODENOSCOPY (EGD);  Surgeon: Cleotis Nipper, MD;  Location: [facility] ENDOSCOPY;  Service: Endoscopy;  Laterality: N/A;  . Foreign body removal  02/10/2012    Procedure: FOREIGN BODY REMOVAL;  Surgeon: Cleotis Nipper, MD;  Location: [facility];  Service: Endoscopy;  Laterality: N/A;  . Back surgery    . Esophagogastroduodenoscopy  06/29/2012    Procedure: ESOPHAGOGASTRODUODENOSCOPY (EGD);  Surgeon: Winfield Cunas., MD;  Location: [facility] ENDOSCOPY;  Service: Endoscopy;  Laterality: N/A;  . Foreign body removal  06/29/2012    Procedure: FOREIGN BODY REMOVAL;  Surgeon: Winfield Cunas., MD;  Location: [facility] ENDOSCOPY;  Service: Endoscopy;  Laterality: N/A;  . Savory dilation  06/29/2012    Procedure: SAVORY DILATION;  Surgeon: Winfield Cunas.,  MD;  Location: [facility] ENDOSCOPY;  Service: Endoscopy;  Laterality: N/A;    Allergies/Intolerances Allergies  Allergen Reactions  . Cephalexin     Unknown  . Doxycycline     Unknown  . Lac Bovis Other (See Comments)    lactose intolerant  . Pentazocine Lactate     He passed out- he had a seizure.  This occurred around 2000    Current Home Medications    acetaminophen-codeine 300-30 MG per tablet  Commonly known as:  TYLENOL #3  - TAKE 1 TABLET BY MOUTH EVERY 8 hours as needed for pain - Ok to fill on 11/10/13.     aspirin 325 MG tablet  Take 325 mg by mouth daily.     atorvastatin 80 MG tablet  Commonly known as:  LIPITOR  Take 1  tablet (80 mg total) by mouth daily.     furosemide 20 MG tablet  Commonly known as:  LASIX  Take 1 or 2 daily as needed     ibuprofen 200 MG tablet  Commonly known as:  ADVIL,MOTRIN  Take 400 mg by mouth every 6 (six) hours as needed for moderate pain.     loperamide 2 MG capsule  Commonly known as:  IMODIUM  Take 6 mg by mouth daily as needed for diarrhea or loose stools. Per bottle as needed     methocarbamol 750 MG tablet  Commonly known as:  ROBAXIN  Take 750 mg by mouth 3 (three) times daily.     metoCLOPramide 5 MG tablet  Commonly known as:  REGLAN  1 tablet before each meal and at bedtime     nebivolol 10 MG tablet  Commonly known as:  BYSTOLIC  Take 20 mg by mouth 2 (two) times daily.     nitroGLYCERIN 0.4 MG SL tablet  Commonly known as:  NITROSTAT  Place 1 tablet (0.4 mg total) under the tongue every 5 (five) minutes as needed. For chest pain     omeprazole 20 MG capsule  Commonly known as:  PRILOSEC  Take 20 mg by mouth 2 (two) times daily before a meal.     sertraline 25 MG tablet  Commonly known as:  ZOLOFT  Take 1 tablet (25 mg total) by mouth daily. Increase to two tablet daily after 1 week.     sitaGLIPtin 100 MG tablet  Commonly known as:  JANUVIA  Take 100 mg by mouth daily.     testosterone cypionate 200 MG/ML injection  Commonly known as:  DEPOTESTOTERONE CYPIONATE  Inject into the muscle every 14 (fourteen) days.     traZODone 150 MG tablet  Commonly known as:  DESYREL  Take 150 mg by mouth at bedtime.     Inpatient Medications . [facility] HOLD] aspirin  300 mg Rectal Daily   Or  . [facility] HOLD] aspirin EC  325 mg Oral Daily  . [facility] HOLD] atorvastatin  80 mg Oral Daily  . [[facility] HOLD] enoxaparin (LOVENOX) injection  40 mg Subcutaneous Q24H  . [[facility] HOLD] insulin aspart  0-9 Units Subcutaneous TID WC  . [facility] HOLD] methocarbamol  500 mg Oral 3 times per day  . [facility] HOLD] metoCLOPramide  5 mg Oral TID AC & HS  . [[facility] HOLD] nebivolol  20 mg Oral BID   . [[facility] HOLD] pantoprazole  40 mg Oral Daily  . [facility] HOLD] traZODone  150 mg Oral QHS   . sodium chloride    . sodium chloride 20 mL/hr at 11/17/13 1343  Social History History   Social History  . Marital Status: Married    Spouse Name: N/A    Number of Children: N/A  . Years of Education: N/A   Occupational History  . 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     Social History Main Topics  . Smoking status: Never Smoker   . Smokeless tobacco: Never Used  . Alcohol Use: No     Comment: rare beer  . Drug Use: No  . Sexual Activity: Not Currently   Other Topics Concern  . Not on file   Social History Narrative  . No narrative on file    Review of Systems  General: No chills, fever, night sweats or weight changes  Cardiovascular:  No chest pain, dyspnea on exertion, edema, orthopnea, palpitations, paroxysmal nocturnal dyspnea Dermatological: No rash, lesions or masses Respiratory: No cough, dyspnea Urologic: No hematuria, dysuria Abdominal: No nausea, vomiting, diarrhea, bright red blood per rectum, melena, or hematemesis Neurologic: No visual changes, weakness, changes in mental status All other systems reviewed and are otherwise negative except as noted above.  Physical Exam Blood pressure 170/80, pulse 69, temperature 98.7 F (37.1 C), temperature source Oral, resp. rate 14, height 5' 7"  (1.702 m), weight 208 lb 4.8 oz (94.484 kg), SpO2 94.00%.  General: Well developed, well appearing 68 y.o. male in no acute distress. HEENT: Normocephalic, atraumatic. EOMs intact. Sclera nonicteric. Oropharynx clear.  Neck: Supple. No JVD. Lungs: Respirations regular and unlabored, CTA bilaterally. No wheezes, rales or rhonchi. Heart: RRR. S1, S2 present. No murmurs, rub, S3 or S4. Abdomen: Soft, non-distended.  Extremities: No clubbing, cyanosis or edema. PT/Radials 2+ and equal bilaterally. Psych: Normal affect. Neuro: Alert and oriented X 3. Moves all extremities  spontaneously. Musculoskeletal: No kyphosis. Skin: Intact. Warm and dry. No rashes or petechiae in exposed areas.   Labs Lab Results  Component Value Date   WBC 9.9 11/16/2013   HGB 15.3 11/16/2013   HCT 45.2 11/16/2013   MCV 99.3 11/16/2013   PLT 222 11/16/2013    Recent Labs Lab 11/14/13 1808 11/15/13 0639  NA 139 141  K 4.1 4.0  CL 96 102  CO2 27 23  BUN 26* 26*  CREATININE 1.14 0.95  CALCIUM 10.2 9.4  PROT 8.8*  --   BILITOT 0.5  --   ALKPHOS 73  --   ALT 25  --   AST 36  --   GLUCOSE 147* 158*    Recent Labs  11/14/13 1808  INR 1.01    Radiology/Studies Dg Chest 2 View  11/14/2013   CLINICAL DATA:  Fall, dizziness, coronary artery disease post CABG, hypertension, diabetes  EXAM: CHEST  2 VIEW  COMPARISON:  09/13/2013  FINDINGS: Normal heart size post CABG.  Atherosclerotic calcification aorta.  Mediastinal contours and pulmonary vascularity normal.  Few scattered calcified pleural plaques bilaterally.  Lungs clear.  No pleural effusion or pneumothorax.  No acute osseous findings.  IMPRESSION: Post CABG.  Question prior asbestos exposure.  No acute abnormalities.   Electronically Signed   By: Lavonia Dana M.D.   On: 11/14/2013 18:26   Ct Head Wo Contrast  11/14/2013   CLINICAL DATA:  Fall.  Headache and neck pain.  EXAM: CT HEAD WITHOUT CONTRAST  CT CERVICAL SPINE WITHOUT CONTRAST  TECHNIQUE: Multidetector CT imaging of the head and cervical spine was performed following the standard protocol without intravenous contrast. Multiplanar CT image reconstructions of the cervical spine were also generated.  COMPARISON:  CT HEAD W/O CM dated 04/02/2012  FINDINGS: CT HEAD FINDINGS  There is new hypoattenuation involving medial right frontal lobe cortex and Basich matter. Adjacent, separate focus of hypoattenuation slightly more laterally in the right frontal lobe is unchanged and compatible with an old infarct. A cavum septum pellucidum et vergae is again noted. No acute intracranial  hemorrhage is seen. Patchy periventricular Strohmeier matter hypodensities are similar to the prior study and compatible with mild to moderate chronic small vessel ischemic disease. A remote lacunar infarct is again seen in the left caudate. There is no evidence of mass, midline shift, or extra-axial fluid collection. Mild generalized cerebral atrophy is unchanged. Atherosclerotic intracranial vertebral ordering carotid siphon calcification is noted. Prior left cataract surgery is noted. The mastoid air cells and visualized paranasal sinuses are clear.  CT CERVICAL SPINE FINDINGS  There is grade 1 anterolisthesis of C3 on C4, likely due to facet seen. There is fusion of the C6 and C7 vertebral bodies. Prevertebral soft tissues are within normal limits. No acute cervical spine fracture is seen. Moderate to severe multilevel facet arthrosis is noted, left greater than right and most severe from C2-3 to C4-5. Mild-to-moderate multilevel spondylosis is present, greatest at C5-6. Pleural thickening/ scarring is noted in the right lung apex. Atherosclerotic calcification is present at the right greater than left carotid bifurcations. Old bilateral posterior first rib fractures are noted.  IMPRESSION: 1. New hypodensity in the medial right frontal lobe, concerning for acute to subacute infarct. 2. No evidence of intracranial hemorrhage. 3. No evidence of acute osseous abnormality in the cervical spine. These results were called by telephone at the time of interpretation on 11/14/2013 at 5:42 PM to Dr. Cleatrice Burke , who verbally acknowledged these results.   Electronically Signed   By: Logan Bores   On: 11/14/2013 17:43   Ct Cervical Spine Wo Contrast  11/14/2013   CLINICAL DATA:  Fall.  Headache and neck pain.  EXAM: CT HEAD WITHOUT CONTRAST  CT CERVICAL SPINE WITHOUT CONTRAST  TECHNIQUE: Multidetector CT imaging of the head and cervical spine was performed following the standard protocol without intravenous contrast.  Multiplanar CT image reconstructions of the cervical spine were also generated.  COMPARISON:  CT HEAD W/O CM dated 04/02/2012  FINDINGS: CT HEAD FINDINGS  There is new hypoattenuation involving medial right frontal lobe cortex and Stash matter. Adjacent, separate focus of hypoattenuation slightly more laterally in the right frontal lobe is unchanged and compatible with an old infarct. A cavum septum pellucidum et vergae is again noted. No acute intracranial hemorrhage is seen. Patchy periventricular Cedano matter hypodensities are similar to the prior study and compatible with mild to moderate chronic small vessel ischemic disease. A remote lacunar infarct is again seen in the left caudate. There is no evidence of mass, midline shift, or extra-axial fluid collection. Mild generalized cerebral atrophy is unchanged. Atherosclerotic intracranial vertebral ordering carotid siphon calcification is noted. Prior left cataract surgery is noted. The mastoid air cells and visualized paranasal sinuses are clear.  CT CERVICAL SPINE FINDINGS  There is grade 1 anterolisthesis of C3 on C4, likely due to facet seen. There is fusion of the C6 and C7 vertebral bodies. Prevertebral soft tissues are within normal limits. No acute cervical spine fracture is seen. Moderate to severe multilevel facet arthrosis is noted, left greater than right and most severe from C2-3 to C4-5. Mild-to-moderate multilevel spondylosis is present, greatest at C5-6. Pleural thickening/ scarring is noted in the right lung  apex. Atherosclerotic calcification is present at the right greater than left carotid bifurcations. Old bilateral posterior first rib fractures are noted.  IMPRESSION: 1. New hypodensity in the medial right frontal lobe, concerning for acute to subacute infarct. 2. No evidence of intracranial hemorrhage. 3. No evidence of acute osseous abnormality in the cervical spine. These results were called by telephone at the time of interpretation on  11/14/2013 at 5:42 PM to Dr. Cleatrice Burke , who verbally acknowledged these results.   Electronically Signed   By: Logan Bores   On: 11/14/2013 17:43   Mr Brain Wo Contrast  11/14/2013   CLINICAL DATA:  Weakness with fall. Headache and confusion. Possible stroke. Abnormal head CT.  EXAM: MRI HEAD WITHOUT CONTRAST  MRA HEAD WITHOUT CONTRAST  TECHNIQUE: Multiplanar, multiecho pulse sequences of the brain and surrounding structures were obtained without intravenous contrast. Angiographic images of the head were obtained using MRA technique without contrast.  COMPARISON:  Head CT same day.  FINDINGS: MRI HEAD FINDINGS  There is a 6 cm region of acute/subacute infarction in the right frontal lobe with swelling. T1 hyperintense material is present in the region consistent with laminar necrosis or low level blood products. This is further evidence of more of a subacute timing. No frank hematoma. . No midline shift. There is an older acute scratched that there is an old or infarction lateral to this region in the frontal lobe with atrophy and gliosis.  There are chronic small vessel changes throughout the pons. No cerebellar insult. There are old small vessel infarctions in the thalami, basal ganglia and throughout the cerebral hemispheric Current matter. No hydrocephalus. No sign of neoplastic mass lesion. No extra-axial collection. No pituitary mass. No inflammatory sinus disease.  MRA HEAD FINDINGS  Both internal carotid arteries are widely patent into the brain. No siphon stenosis. On the left, the anterior and middle cerebral arteries are widely patent. On the right, the middle cerebral artery appears widely patent and normal proximally. The anterior cerebral artery is small but does show flow. There is distal vessel atherosclerotic irregularity diffusely.  Both vertebral arteries are patent to the basilar. No basilar stenosis. Posterior circulation branch vessels are patent. There is focal stenosis in the left PCA  1.3 cm beyond its origin.  IMPRESSION: Subacute infarction in the right frontal lobe. Swelling, laminar necrosis and low level blood product deposition. No frank hematoma.  Older right frontal cortical infarction adjacent to that.  Chronic small vessel changes elsewhere throughout the brain.  Distal vessel atherosclerotic changes diffusely. The right anterior cerebral artery is patent but is smaller than the left.   Electronically Signed   By: Nelson Chimes M.D.   On: 11/14/2013 19:48   Transthoracic echocardiogram  Study Conclusions Left ventricle: The cavity size was normal. Systolic function was normal. The estimated ejection fraction was in the range of 55% to 60%. Wall motion was normal; there were no regional wall motion abnormalities.  12-lead ECG - SR Telemetry reviewed in full - SR; no arrhythmias  Assessment and Plan 1. Cryptogenic stroke 2. CAD s/p CABG 3. HTN 4. OSA 5. Seizure disorder  Atrial flutter is listed in his medical history; however, I cannot find any record of this and, on review of all available ECGs, none show atrial flutter. He did not have any arrhythmias following bypass surgery according to the DC summary. He has been admitted in the past for confusion, diarrhea and seizures without mention of cardiac arrhythmias during the hospitalization.  If the TEE is negative, we recommend loop recorder insertion to monitor for AF. The indication for loop recorder insertion / monitoring for AF in setting of cryptogenic stroke was discussed with the patient. The loop recorder insertion procedure was reviewed in detail including risks and benefits. These risks include but are not limited to bleeding and infection. The patient expressed verbal understanding and agrees to proceed. The patient was also counseled regarding wound care and device follow-up.  Signed, EDMISTEN, BROOKE 11/17/2013, 2:13 PM   AS ABOVE FOR cryptogenic stroke LINQ insertion

## 2013-11-17 NOTE — Progress Notes (Signed)
Stroke Team Progress Note  HISTORY [name] is an 68 y.o. male history of atrial flutter, hypertension, hyperlipidemia, diabetes mellitus and seizures, who was brought to the emergency room 03/22/2015for evaluation because his wife felt he experienced a change in mental status and wasn't acting himself. She noticed that he was somewhat less alert than usual and wanted to sleep more than usual. He suffered a fall yesterday that he attributes to his left knee buckling while going down a set of steps. He did not notice any weakness of his left lower extremity at any point before or after his fall. CT scan and MRI showed findings indicative of acute to subacute right frontal infarction. Blood breakdown products were noted. No frank hematoma noted, however. NIH stroke score was 0. Patient has been complaining of headache but tends to have chronic headaches with no real change in the past several days. Patient has been taking aspirin daily. Patient was not administerd TPA secondary to unknown last known well. He was admitted for further evaluation and treatment.  SUBJECTIVE No family present at bedside.  OBJECTIVE Most recent Vital Signs: Filed Vitals:   11/16/13 2000 11/17/13 0000 11/17/13 0400 11/17/13 0806  BP: 134/70 118/64 135/85 121/62  Pulse: 75 70 83 68  Temp: 98.1 F (36.7 C)  98.2 F (36.8 C) 98.8 F (37.1 C)  TempSrc: Oral  Oral   Resp:      Height:      Weight:   94.484 kg (208 lb 4.8 oz)   SpO2: 97% 93% 94% 94%   CBG (last 3)   Recent Labs  11/16/13 1658 11/16/13 2119 11/17/13 0753  GLUCAP 87 121* 133*    IV Fluid Intake:   . sodium chloride    . sodium chloride      MEDICATIONS  . aspirin  300 mg Rectal Daily   Or  . aspirin EC  325 mg Oral Daily  . atorvastatin  80 mg Oral Daily  . enoxaparin (LOVENOX) injection  40 mg Subcutaneous Q24H  . insulin aspart  0-9 Units Subcutaneous TID WC  . methocarbamol  500 mg Oral 3 times per day  . metoCLOPramide  5 mg  Oral TID AC & HS  . nebivolol  20 mg Oral BID  . pantoprazole  40 mg Oral Daily  . traZODone  150 mg Oral QHS   PRN:  acetaminophen-codeine, senna-docusate  Diet:  NPO  Activity:  Bedrest, OOB with assistance, ambulate patient DVT Prophylaxis:  Lovenox 40 mg sq daily   CLINICALLY SIGNIFICANT STUDIES Basic Metabolic Panel:   Recent Labs Lab 11/14/13 1808 11/15/13 0639  NA 139 141  K 4.1 4.0  CL 96 102  CO2 27 23  GLUCOSE 147* 158*  BUN 26* 26*  CREATININE 1.14 0.95  CALCIUM 10.2 9.4   Liver Function Tests:   Recent Labs Lab 11/14/13 1808  AST 36  ALT 25  ALKPHOS 73  BILITOT 0.5  PROT 8.8*  ALBUMIN 4.4   CBC:   Recent Labs Lab 11/14/13 1808 11/15/13 0639 11/16/13 0930  WBC 12.4* 11.7* 9.9  NEUTROABS 9.4*  --   --   HGB 17.5* 15.8 15.3  HCT 52.0 46.6 45.2  MCV 98.3 99.1 99.3  PLT 272 243 222   Coagulation:   Recent Labs Lab 11/14/13 1808  LABPROT 13.1  INR 1.01   Cardiac Enzymes: No results found for this basename: CKTOTAL, CKMB, CKMBINDEX, TROPONINI,  in the last 168 hours Urinalysis:   Recent Labs  Lab 11/14/13 1723  COLORURINE YELLOW  LABSPEC 1.015  PHURINE 5.5  GLUCOSEU 100*  HGBUR NEGATIVE  BILIRUBINUR NEGATIVE  KETONESUR NEGATIVE  PROTEINUR NEGATIVE  UROBILINOGEN 0.2  NITRITE NEGATIVE  LEUKOCYTESUR NEGATIVE   Lipid Panel    Component Value Date/Time   CHOL 209* 10/29/2013 1611   TRIG 441* 10/29/2013 1611   HDL 36* 10/29/2013 1611   CHOLHDL 5.8 10/29/2013 1611   VLDL NOT CALC 10/29/2013 1611   LDLCALC NOT CALC 10/29/2013 1611   HgbA1C  Lab Results  Component Value Date   HGBA1C 6.8* 11/15/2013    Urine Drug Screen:     Component Value Date/Time   LABOPIA POSITIVE* 11/14/2013 1723   LABOPIA POSITIVE* 04/03/2012 0910   COCAINSCRNUR NONE DETECTED 11/14/2013 1723   COCAINSCRNUR NEGATIVE 04/03/2012 0910   LABBENZ NONE DETECTED 11/14/2013 1723   LABBENZ NEGATIVE 04/03/2012 0910   AMPHETMU NONE DETECTED 11/14/2013 1723   AMPHETMU NEGATIVE  04/03/2012 0910   THCU NONE DETECTED 11/14/2013 1723   LABBARB NONE DETECTED 11/14/2013 1723    Alcohol Level:   Recent Labs Lab 11/14/13 1808  ETH <11    CT Cervical Spine 11/14/2013   1. New hypodensity in the medial right frontal lobe, concerning for acute to subacute infarct. 2. No evidence of intracranial hemorrhage. 3. No evidence of acute osseous abnormality in the cervical spine.    CT of the brain  11/14/2013   1. New hypodensity in the medial right frontal lobe, concerning for acute to subacute infarct. 2. No evidence of intracranial hemorrhage. 3. No evidence of acute osseous abnormality in the cervical spine.   MRI of the brain  11/14/2013    Subacute infarction in the right frontal lobe. Swelling, laminar necrosis and low level blood product deposition. No frank hematoma.  Older right frontal cortical infarction adjacent to that.  Chronic small vessel changes elsewhere throughout the brain.    MRA of the brain  11/14/2013   Distal vessel atherosclerotic changes diffusely. The right anterior cerebral artery is patent but is smaller than the left.     2D Echocardiogram  EF 55-60% with no source of embolus.   Carotid Doppler  No evidence of hemodynamically significant internal carotid artery stenosis. Vertebral artery flow is antegrade.   TEE   CXR  11/14/2013   Post CABG.  Question prior asbestos exposure.  No acute abnormalities.     EKG  normal sinus rhythm. For complete results please see formal report.   Therapy Recommendations HH PT, no OT  Physical Exam   Middle aged male not in distress.Awake alert. Afebrile. Head is nontraumatic. Neck is supple without bruit. Hearing is normal. Cardiac exam no murmur or gallop. Lungs are clear to auscultation. Distal pulses are well felt. Neurological Exam ;   Mental Status:  Alert, oriented, thought content appropriate. Speech fluent without evidence of aphasia. Able to follow commands without difficulty.  Cranial Nerves:  II-Visual  fields were normal.  III/IV/VI-Pupils were equal and reacted. Extraocular movements were full and conjugate.  V/VII-no facial numbness and no facial weakness.  VIII-normal.  X-normal speech and symmetrical palatal movement.  Motor: 5/5 bilaterally with normal tone and bulk  Sensory: Normal throughout.  Deep Tendon Reflexes: 1+ and symmetric.  Plantars: Flexor bilaterally  Cerebellar: Normal finger-to-nose testing.   ASSESSMENT [name] is a 68 y.o. male presenting with a change in mental status "not acting right". Imaging confirms a right frontal infarct in this setting of an old right ACA  infarct. Infarcts felt to be embolic secondary to unknown etiology, determined not to have a hx of atrial flutter.  On aspirin 325 mg orally every day prior to admission. Now on aspirin 325 mg orally every day for secondary stroke prevention. Stroke work up underway.   hypertension Hyperlipidemia, LDL unable to calculate, on lipitor 80 mg daily PTA, now on Lipitor 80 mg, goal LDL < 100  Diabetes, HgbA1c 6.8,goal < 7.0 No Hx atrial flutter per  Dr. [place] Hx etoh use obstructive sleep apnea, does not use CPAP CAD  Hx seizure d/o  Hospital day # 3  TREATMENT/PLAN  Continue aspirin po or suppository daily for secondary stroke prevention.  TEE today to look for embolic source.  If TEE negative, a [facility] electrophysiologist will consult and consider placement of an place implantable loop recorder to evaluate for atrial fibrillation as etiology of stroke. This has been explained to patient/family by Dr. Leonie Man and they are agreeable. Home health PT  Dr. Leonie Man discussed diagnosis, prognosis,  treatment options and plan of care with family practice resident.    Burnetta Sabin, MSN, RN, ANVP-BC, AGPCNP-BC [facility] Pager: [phone] 11/17/2013 10:06 AM  I have personally obtained a history, examined the patient, evaluated imaging results, and  formulated the assessment and plan of care. I agree with the above. Antony Contras, MD   To contact Stroke Continuity provider, please refer to [email or web address] After hours, contact General Neurology

## 2013-11-17 NOTE — Progress Notes (Signed)
Family Medicine Teaching Service Daily Progress Note Intern Pager: [phone]  Patient name: [name] Medical record number: [id number] Date of birth: [birth year] Age: 68 y.o. Gender: male  Primary Care Provider: [name], MD Consultants: neuro  Code Status: full  Pt Overview and Major Events to Date:  3/23 - admitted with subacute stroke  Assessment and Plan: [name] is a 68 y.o. male presenting with subacute stroke . PMH is significant for CAD s/p CABG, HLD, HTN, h/o sz d/o, h/o a flutter, depression, h/o alcohol abuse, DM, and AKI.   # Subacute stroke - CT head with hypodensity in medial R frontal lobe, MRI with subacute infarction right frontal lobe with low level blood product deposition. Recent driving changes (more slowly, stopped 3 times by police, more sudden turns in front of traffic) may be stepwise change from vascular dementia. UDS only pos for opiates, blood alc neg, and UA only pos for glucose.   - Permissive HTN. BP in ED 112-160/65-86.  - Echo 03/01/13 with EF 55-60, mod LVH, grade I diastolic dysfunction. Repeat normal without diastolic dysfunction - AM EKG and telemetry normal  - Carotid doppler normal - A1c - 6.3 on 6/17. Reportedly 8.1 in 8/14. 6.8 here  - Lipid panel fasting recently collected 10/29/13 - chol 209, TG 441, HDL 36, LDL not calc. Discussed high chol at recent ofc visit and pt did not want to add fibrate to lipitor 80 he was already taking.  - PT/OT/ SLP consulted, needs HH PT, SLP, RN - Neuro checks q2  - continue ASA for secondary prevention  - Neuro consulted - appreciate recs.  - Plan for TEE and then loop recorder today to evaluate for atrial arrhythmia  # Fall hitting head - Seems more likely related to knee symptoms/ locking up than stroke. CT neg for acute bleed or c-spine injury.  - PT / OT consult for gait.  - PT rec home health PT, ordered this and HH SLP and RN  # Mild leukocytosis - Resolved   # AKI - Resolved   # HTN -  Stable. 110s-130s SBP - Allow permissive HTN.  - Hold lasix prn and losartan and continue nebivolol.   # CAD hx, CABG 2005, stress test 10/26/12 showing low risk with small area of decreased uptake at base of inferior wall; EF 62%  - Continue aspirin 325 mg - Continue nebivolol 31m BID   # Depression:  - Not taking cymbalta 60 because feels it does not work.  - Sees psychiatrist 4/20.  - rec another antidepressant on d/c   # DM: A1c 6.8  - Hold januvia.  - SSI sn.  - Continue reglan   # HLD - Values listed above.  - Continue home lipitor 80.   # Arthritis  - Continue tylenol #3 prn, hold ibuprofen given mild AKI, and continue robaxin but decr to 5047mTID.   FEN/GI: heart healthy / carb mod diet. SLIV Prophylaxis: lovenox  Disposition: home today after TEE or early tomorrow  Subjective: feeling fine, NAEON, hungry and ready for TEE so he can go home  Objective: Temp:  [97.5 F (36.4 C)-98.8 F (37.1 C)] 98.8 F (37.1 C) (03/25 0806) Pulse Rate:  [68-85] 68 (03/25 0806) Resp:  [17] 17 (03/24 1615) BP: (113-135)/(62-85) 121/62 mmHg (03/25 0806) SpO2:  [93 %-97 %] 94 % (03/25 0806) Weight:  [208 lb 4.8 oz (94.484 kg)] 208 lb 4.8 oz (94.484 kg) (03/25 0400) Physical Exam: General: NAD, cooperative  HEENT: AT/[place], sclera clear, MMM  Cardiovascular: RRR, no m/r/g, though distant Respiratory: CTAB, normal effort  Abdomen: Soft, mildly distended, nontender, NABS  Extremities: No LE edema or calf tendernress Skin: no rash or cyanosis  Neuro: Awake, alert, no focal deficits  Laboratory:  Recent Labs Lab 11/14/13 1808 11/15/13 0639 11/16/13 0930  WBC 12.4* 11.7* 9.9  HGB 17.5* 15.8 15.3  HCT 52.0 46.6 45.2  PLT 272 243 222    Recent Labs Lab 11/14/13 1808 11/15/13 0639  NA 139 141  K 4.1 4.0  CL 96 102  CO2 27 23  BUN 26* 26*  CREATININE 1.14 0.95  CALCIUM 10.2 9.4  PROT 8.8*  --   BILITOT 0.5  --   ALKPHOS 73  --   ALT 25  --   AST 36  --   GLUCOSE  147* 158*   UDS: opiates  UA: 100 glucose, otherwise negative  Blood alcohol: Neg  poc trop neg   Imaging/Diagnostic Tests: CT head and c-spine w/o contrast:  1. New hypodensity in the medial right frontal lobe, concerning for acute to subacute infarct.  2. No evidence of intracranial hemorrhage.  3. No evidence of acute osseous abnormality in the cervical spine.   MRI/A head: Subacute infarction in the right frontal lobe. Swelling, laminar necrosis and low level blood product deposition. No frank hematoma. Older right frontal cortical infarction adjacent to that. Chronic small vessel changes elsewhere throughout the brain. Distal vessel atherosclerotic changes diffusely. The right anterior cerebral artery is patent but is smaller than the left.   DG chest 2 view:  Post CABG. Question prior asbestos exposure. No acute abnormalities.   EKG: Sinus rhythm with anteroseptal infarct, age 29.  Echo: Left ventricle: The cavity size was normal. Systolic function was normal. The estimated ejection fraction was in the range of 55% to 60%. Wall motion was normal; there were no regional wall motion abnormalities.  Carotid Dopplers: Bilateral: 1-39% ICA stenosis. Vertebral artery flow is antegrade.    Beverlyn Roux, MD 11/17/2013, 8:11 AM PGY-1, [place] Intern pager: [phone], text pages welcome

## 2013-11-17 NOTE — CV Procedure (Signed)
[name] [id number]  [id number]  Preop RN:[id number] STROKE Postop Dx same/     Procedure  Loop Recorder implantation  After routine prep and drape of the left parasternal area, a small incision was created. A Medtronic LINQ Reveal Loop Recorder  Serial Number  [id number] was inserted.    Dermabond was  applied.  The patient tolerated the procedure without apparent complication.

## 2013-11-17 NOTE — Progress Notes (Signed)
Echocardiogram Echocardiogram Transesophageal has been performed.  Jaymin, Waln 11/17/2013, 2:38 PM

## 2013-11-17 NOTE — Discharge Summary (Signed)
[facility] Discharge Summary  Patient name: [name] Medical record number: [id number] Date of birth: December 09, 1945 Age: 68 y.o. Gender: male Date of Admission: 11/14/2013  Date of Discharge: 11/18/13 Admitting Physician: Lupita Dawn, MD  Primary Care Provider: [name], MD Consultants: neurology, cardiology  Indication for Hospitalization: CVA  Discharge Diagnoses/Problem List:  Patient Active Problem List   Diagnosis Date Noted  . Stroke 11/14/2013  . Cough 06/09/2013  . Pleural plaque consistent with asbestos exposure by CT 10/2012 02/17/2013  . Renal failure 02/13/2013  . Dyspnea 02/11/2013  . Dysphagia, unspecified(787.20) 05/12/2012  . Seizure disorder 04/04/2012  . CAD (coronary artery disease) 06/13/2011  . HTN (hypertension) 06/13/2011  . Diabetes mellitus 06/13/2011  . History of seizure disorder   . Depression   . Back pain   . Hearing loss   . Arrhythmia   . Diverticulitis   . H/O alcohol abuse     Disposition: home  Discharge Condition: stable  Brief Hospital Course: [name] is a 68 y.o. male presenting with subacute stroke . PMH is significant for CAD s/p CABG, HLD, HTN, h/o sz d/o, h/o a flutter, depression, h/o alcohol abuse, DM, and AKI.   # Subacute stroke: The patient presented to the hospital after a fall resulting from knee pain and leg weakness. While in the ED for this he had a subacute CVA found incidentally on head CT and was admitted for this. His stroke work-up was negative for embolic source. A review of his medical history indicated that he may have a history of atrial arrhythmia but it was not clearly documented anywhere and there was no discussion of treatment for this. He Received a TEE and an implantable loop recorder to help determine whether this arrhythmia might have lead to his stroke and whether he needed future anticoagulation to prevent recurrence.   Issues for Follow Up:  # SSRI: Patient  reported not taking cymbalta because he felt it did not work. Started zoloft at discharge. Follow up and titrate as needed.  # HTN: His BP was normal during admission despite holding lasix and losartan so losartan was held on discharge. Restart as needed.  Significant Procedures: TEE, loop implantation  Significant Labs and Imaging:   Recent Labs Lab 11/14/13 1808 11/15/13 0639 11/16/13 0930  WBC 12.4* 11.7* 9.9  HGB 17.5* 15.8 15.3  HCT 52.0 46.6 45.2  PLT 272 243 222    Recent Labs Lab 11/14/13 1808 11/15/13 0639  NA 139 141  K 4.1 4.0  CL 96 102  CO2 27 23  GLUCOSE 147* 158*  BUN 26* 26*  CREATININE 1.14 0.95  CALCIUM 10.2 9.4  ALKPHOS 73  --   AST 36  --   ALT 25  --   ALBUMIN 4.4  --    UDS: opiates  UA: 100 glucose, otherwise negative  Blood alcohol: Neg  poc trop neg   CT head and c-spine w/o contrast:  1. New hypodensity in the medial right frontal lobe, concerning for acute to subacute infarct.  2. No evidence of intracranial hemorrhage.  3. No evidence of acute osseous abnormality in the cervical spine.   MRI/A head:  Subacute infarction in the right frontal lobe. Swelling, laminar necrosis and low level blood product deposition. No frank hematoma. Older right frontal cortical infarction adjacent to that. Chronic small vessel changes elsewhere throughout the brain. Distal vessel atherosclerotic changes diffusely. The right anterior cerebral artery is patent but is  smaller than the left.   DG chest 2 view:  Post CABG. Question prior asbestos exposure. No acute abnormalities.   EKG: Sinus rhythm with anteroseptal infarct, age 22.   Transthoracic echo: Left ventricle: The cavity size was normal. Systolic function was normal. The estimated ejection fraction was in the range of 55% to 60%. Wall motion was normal; there were no regional wall motion abnormalities.  Carotid Dopplers: Bilateral: 1-39% ICA stenosis. Vertebral artery flow is antegrade.    Transesophageal echo: Normal LV size and systolic function, EF 78%. Normal RV size and systolic function. Trivial MR. No LA appendage thrombus. No PFO (negative bubble study). Grade III plaque in the descending thoracic aorta. No source for embolism.  Results/Tests Pending at Time of Discharge: none  Discharge Medications:    Medication List    STOP taking these medications       chlorproMAZINE 25 MG tablet  Commonly known as:  THORAZINE     DULoxetine 60 MG capsule  Commonly known as:  CYMBALTA     losartan 100 MG tablet  Commonly known as:  COZAAR      TAKE these medications       acetaminophen-codeine 300-30 MG per tablet  Commonly known as:  TYLENOL #3  - TAKE 1 TABLET BY MOUTH EVERY 8 hours as needed for pain  - Ok to fill on 11/10/13.     aspirin 325 MG tablet  Take 325 mg by mouth daily.     atorvastatin 80 MG tablet  Commonly known as:  LIPITOR  Take 1 tablet (80 mg total) by mouth daily.     furosemide 20 MG tablet  Commonly known as:  LASIX  Take 1 or 2 daily as needed     ibuprofen 200 MG tablet  Commonly known as:  ADVIL,MOTRIN  Take 400 mg by mouth every 6 (six) hours as needed for moderate pain.     loperamide 2 MG capsule  Commonly known as:  IMODIUM  Take 6 mg by mouth daily as needed for diarrhea or loose stools. Per bottle as needed     methocarbamol 750 MG tablet  Commonly known as:  ROBAXIN  Take 750 mg by mouth 3 (three) times daily.     metoCLOPramide 5 MG tablet  Commonly known as:  REGLAN  1 tablet before each meal and at bedtime     nebivolol 10 MG tablet  Commonly known as:  BYSTOLIC  Take 20 mg by mouth 2 (two) times daily.     nitroGLYCERIN 0.4 MG SL tablet  Commonly known as:  NITROSTAT  Place 1 tablet (0.4 mg total) under the tongue every 5 (five) minutes as needed. For chest pain     omeprazole 20 MG capsule  Commonly known as:  PRILOSEC  Take 20 mg by mouth 2 (two) times daily before a meal.     sertraline 25 MG  tablet  Commonly known as:  ZOLOFT  Take 1 tablet (25 mg total) by mouth daily. Increase to two tablet daily after 1 week.     sitaGLIPtin 100 MG tablet  Commonly known as:  JANUVIA  Take 100 mg by mouth daily.     testosterone cypionate 200 MG/ML injection  Commonly known as:  DEPOTESTOTERONE CYPIONATE  Inject into the muscle every 14 (fourteen) days.     traZODone 150 MG tablet  Commonly known as:  DESYREL  Take 150 mg by mouth at bedtime.        Discharge Instructions:  Please refer to Patient Instructions section of EMR for full details.  Patient was counseled important signs and symptoms that should prompt return to medical care, changes in medications, dietary instructions, activity restrictions, and follow up appointments.   Follow-Up Appointments: Follow-up Information   Follow up with [facility]- RN/PT/SLP arranged)    Contact information:   [address] [place] [address] [phone]       Follow up with DAUB, STEVE A, MD. Schedule an appointment as soon as possible for a visit in 1 week.   Specialty:  Family Medicine   Contact information:   [place] [address] [phone]       Follow up with [facility] On 11/25/2013. (At 3:30 PM for wound check)    Specialty:  Cardiology   Contact information:   [address] [place] [address] [phone]      Follow up with Forbes Cellar, MD. Schedule an appointment as soon as possible for a visit in 2 months. (Stroke Clinic)    Specialties:  Neurology, Radiology   Contact information:   [address] [place] [address] [phone]       Beverlyn Roux, MD 11/21/2013, 11:34 AM PGY-1, [place]

## 2013-11-17 NOTE — CV Procedure (Signed)
Procedure: TEE  Indication: CVA  Sedation: Versed 4 mg IV, Fentanyl 50 mcg IV  Findings: Please see echo section for full report.  Normal LV size and systolic function, EF 07%.  Normal RV size and systolic function.  Trivial MR.  No LA appendage thrombus.  No PFO (negative bubble study).  Grade III plaque in the descending thoracic aorta.   No source for embolism.   Loralie Champagne 11/17/2013 2:29 PM

## 2013-11-18 ENCOUNTER — Encounter (HOSPITAL_COMMUNITY): Payer: Self-pay | Admitting: Cardiology

## 2013-11-18 LAB — GLUCOSE, CAPILLARY
GLUCOSE-CAPILLARY: 117 mg/dL — AB (ref 70–99)
Glucose-Capillary: 126 mg/dL — ABNORMAL HIGH (ref 70–99)

## 2013-11-18 NOTE — Progress Notes (Signed)
FMTS Attending Daily Note: Brad Lieurance MD [phone] pager office [phone] I  have seen and examined this patient, reviewed their chart. I have discussed this patient with the resident. I agree with the resident's findings, assessment and care plan. 

## 2013-11-18 NOTE — Progress Notes (Signed)
Family Medicine Teaching Service Daily Progress Note Intern Pager: [phone]  Patient name: [name] Medical record number: [id number] Date of birth: [birth year] Age: 68 y.o. Gender: male  Primary Care Provider: [name], MD Consultants: neuro  Code Status: full  Pt Overview and Major Events to Date:  3/23 - admitted with subacute stroke  Assessment and Plan: [name] is a 68 y.o. male presenting with subacute stroke . PMH is significant for CAD s/p CABG, HLD, HTN, h/o sz d/o, h/o a flutter, depression, h/o alcohol abuse, DM, and AKI.   # Subacute stroke - CT head with hypodensity in medial R frontal lobe, MRI with subacute infarction right frontal lobe with low level blood product deposition. Recent driving changes (more slowly, stopped 3 times by police, more sudden turns in front of traffic) may be stepwise change from vascular dementia. UDS only pos for opiates, blood alc neg, and UA only pos for glucose.   - Permissive HTN. BP in ED 112-160/65-86.  - Echo 03/01/13 with EF 55-60, mod LVH, grade I diastolic dysfunction. Repeat normal without diastolic dysfunction - AM EKG and telemetry normal  - Carotid doppler normal - A1c - 6.3 on 6/17. Reportedly 8.1 in 8/14. 6.8 here  - Lipid panel fasting recently collected 10/29/13 - chol 209, TG 441, HDL 36, LDL not calc. Discussed high chol at recent ofc visit and pt did not want to add fibrate to lipitor 80 he was already taking.  - PT/OT/ SLP consulted, needs HH PT, SLP, RN - Neuro checks q2  - continue ASA for secondary prevention  - Neuro consulted - appreciate recs.  - TEE negative for embolic source or PFO - loop recorder placed  # Fall hitting head - Seems more likely related to knee symptoms/ locking up than stroke. CT neg for acute bleed or c-spine injury.  - PT / OT consult for gait.  - PT rec home health PT, ordered this and HH SLP and RN  # Mild leukocytosis - Resolved   # AKI - Resolved   # HTN - Stable. 110s-130s  SBP - Allow permissive HTN.  - Hold lasix prn and losartan and continue nebivolol.   # CAD hx, CABG 2005, stress test 10/26/12 showing low risk with small area of decreased uptake at base of inferior wall; EF 62%  - Continue aspirin 325 mg - Continue nebivolol 69m BID   # Depression:  - Not taking cymbalta 60 because feels it does not work.  - Sees psychiatrist 4/20.  - rec another antidepressant on d/c   # DM: A1c 6.8  - Hold januvia.  - SSI sn.  - Continue reglan   # HLD - Values listed above.  - Continue home lipitor 80.   # Arthritis  - Continue tylenol #3 prn, hold ibuprofen given mild AKI, and continue robaxin but decr to 5010mTID.   FEN/GI: heart healthy / carb mod diet. SLIV Prophylaxis: lovenox  Disposition: home today   Subjective: feeling fine, NAEON, ready for discharge  Objective: Temp:  [98.3 F (36.8 C)-98.7 F (37.1 C)] 98.6 F (37 C) (03/26 0800) Pulse Rate:  [69-80] 80 (03/26 0800) Resp:  [13-19] 18 (03/26 0800) BP: (112-177)/(61-99) 112/66 mmHg (03/26 0800) SpO2:  [91 %-97 %] 95 % (03/26 0800) Physical Exam: General: NAD, cooperative  HEENT: AT/[place], sclera clear, MMM  Cardiovascular: RRR, no m/r/g, though distant Respiratory: CTAB, normal effort  Abdomen: Soft, nondistended, nontender, NABS  Extremities: No LE edema or  calf tendernress Skin: no rash or cyanosis  Neuro: Awake, alert, no focal deficits  Laboratory:  Recent Labs Lab 11/14/13 1808 11/15/13 0639 11/16/13 0930  WBC 12.4* 11.7* 9.9  HGB 17.5* 15.8 15.3  HCT 52.0 46.6 45.2  PLT 272 243 222    Recent Labs Lab 11/14/13 1808 11/15/13 0639  NA 139 141  K 4.1 4.0  CL 96 102  CO2 27 23  BUN 26* 26*  CREATININE 1.14 0.95  CALCIUM 10.2 9.4  PROT 8.8*  --   BILITOT 0.5  --   ALKPHOS 73  --   ALT 25  --   AST 36  --   GLUCOSE 147* 158*   UDS: opiates  UA: 100 glucose, otherwise negative  Blood alcohol: Neg  poc trop neg   Imaging/Diagnostic Tests: CT head and  c-spine w/o contrast:  1. New hypodensity in the medial right frontal lobe, concerning for acute to subacute infarct.  2. No evidence of intracranial hemorrhage.  3. No evidence of acute osseous abnormality in the cervical spine.   MRI/A head: Subacute infarction in the right frontal lobe. Swelling, laminar necrosis and low level blood product deposition. No frank hematoma. Older right frontal cortical infarction adjacent to that. Chronic small vessel changes elsewhere throughout the brain. Distal vessel atherosclerotic changes diffusely. The right anterior cerebral artery is patent but is smaller than the left.   DG chest 2 view:  Post CABG. Question prior asbestos exposure. No acute abnormalities.   EKG: Sinus rhythm with anteroseptal infarct, age 46.  Echo: Left ventricle: The cavity size was normal. Systolic function was normal. The estimated ejection fraction was in the range of 55% to 60%. Wall motion was normal; there were no regional wall motion abnormalities.  Carotid Dopplers: Bilateral: 1-39% ICA stenosis. Vertebral artery flow is antegrade.    Beverlyn Roux, MD 11/18/2013, 12:09 PM PGY-1, [place] Intern pager: [phone], text pages welcome

## 2013-11-18 NOTE — Progress Notes (Signed)
Pt provided with dc instructions and edcuation. Pt verbalized understanding. Pt has no questions at this time. IV removed with tip intact. Heart monitor cleaned andr eturned to front. Selinda Flavin, RN

## 2013-11-18 NOTE — Discharge Instructions (Signed)
STROKE/TIA DISCHARGE INSTRUCTIONS SMOKING Cigarette smoking nearly doubles your risk of having a stroke & is the single most alterable risk factor  If you smoke or have smoked in the last 12 months, you are advised to quit smoking for your health.  Most of the excess cardiovascular risk related to smoking disappears within a year of stopping.  Ask you doctor about anti-smoking medications  [place] Quit Line: 1-800-QUIT NOW  Free Smoking Cessation Classes [phone]  CHOLESTEROL Know your levels; limit fat & cholesterol in your diet  Lipid Panel     Component Value Date/Time   CHOL 209* 10/29/2013 1611   TRIG 441* 10/29/2013 1611   HDL 36* 10/29/2013 1611   CHOLHDL 5.8 10/29/2013 1611   VLDL NOT CALC 10/29/2013 1611   LDLCALC NOT CALC 10/29/2013 1611      Many patients benefit from treatment even if their cholesterol is at goal.  Goal: Total Cholesterol (CHOL) less than 160  Goal:  Triglycerides (TRIG) less than 150  Goal:  HDL greater than 40  Goal:  LDL (LDLCALC) less than 100   BLOOD PRESSURE American Stroke Association blood pressure target is less that 120/80 mm/Hg  Your discharge blood pressure is:  BP: 121/62 mmHg  Monitor your blood pressure  Limit your salt and alcohol intake  Many individuals will require more than one medication for high blood pressure  DIABETES (A1c is a blood sugar average for last 3 months) Goal HGBA1c is under 7% (HBGA1c is blood sugar average for last 3 months)  Diabetes:     Lab Results  Component Value Date   HGBA1C 6.8* 11/15/2013     Your HGBA1c can be lowered with medications, healthy diet, and exercise.  Check your blood sugar as directed by your physician  Call your physician if you experience unexplained or low blood sugars.  PHYSICAL ACTIVITY/REHABILITATION Goal is 30 minutes at least 4 days per week  Activity: Increase activity slowly, and Walk with assistance, Therapies: Physical Therapy: use rolling walker when out of bed Return to  work: NA  Activity decreases your risk of heart attack and stroke and makes your heart stronger.  It helps control your weight and blood pressure; helps you relax and can improve your mood.  Participate in a regular exercise program.  Talk with your doctor about the best form of exercise for you (dancing, walking, swimming, cycling).  DIET/WEIGHT Goal is to maintain a healthy weight  Your discharge diet is: carb-mod thin liquids Your height is:  Height: 5' 7"  (170.2 cm) Your current weight is: Weight: 94.484 kg (208 lb 4.8 oz) Your Body Mass Index (BMI) is:  BMI (Calculated): 32.6  Following the type of diet specifically designed for you will help prevent another stroke.  Your goal weight range is:    Your goal Body Mass Index (BMI) is 19-24.  Healthy food habits can help reduce 3 risk factors for stroke:  High cholesterol, hypertension, and excess weight.  RESOURCES Stroke/Support Group:  Call [phone]   STROKE EDUCATION PROVIDED/REVIEWED AND GIVEN TO PATIENT Stroke warning signs and symptoms How to activate emergency medical system (call 911). Medications prescribed at discharge. Need for follow-up after discharge. Personal risk factors for stroke. Pneumonia vaccine given: No Flu vaccine given: No My questions have been answered, the writing is legible, and I understand these instructions.  I will adhere to these goals & educational materials that have been provided to me after my discharge from the hospital.    Stroke Prevention  Some health problems and behaviors may make it more likely for you to have a stroke. Below are ways to lessen your risk of having a stroke.   Be active for at least 30 minutes on most or all days.  Do not smoke. Try not to be around others who smoke.  Do not drink too much alcohol.  Do not have more than 2 drinks a day if you are a man.  Do not have more than 1 drink a day if you are a woman and are not pregnant.  Eat healthy foods, such as  fruits and vegetables. If you were put on a specific diet, follow the diet as told.  Keep your cholesterol levels under control through diet and medicines. Look for foods that are low in saturated fat, trans fat, cholesterol, and are high in fiber.  If you have diabetes, follow all diet plans and take your medicine as told.  If you have high blood pressure (hypertension), follow all diet plans and take your medicine as told.  Keep a healthy weight. Eat foods that are low in calories, salt, saturated fat, trans fat, and cholesterol.  Do not take drugs.  Avoid birth control pills, if this applies. Talk to your doctor about the risks of taking birth control pills.  Talk to your doctor if you have sleep problems (sleep apnea).  Take all medicine as told by your doctor.  You may be told to take aspirin or blood thinner medicine. Take this medicine as told by your doctor.  Understand your medicine instructions.  Make sure any other conditions you have are being taken care of. GET HELP RIGHT AWAY IF:  You suddenly lose feeling (you feel numb) or have weakness in your face, arm, or leg.  Your face or eyelid hangs down to one side.  You suddenly feel confused.  You have trouble talking (aphasia) or understanding what people are saying.  You suddenly have trouble seeing in one or both eyes.  You suddenly have trouble walking.  You are dizzy.  You lose your balance or your movements are clumsy (uncoordinated).  You suddenly have a very bad headache and you do not know the cause.  You have new chest pain.  Your heart feels like it is fluttering or skipping a beat (irregular heartbeat). Do not wait to see if the symptoms above go away. Get help right away. Call your local emergency services (911 in [place] Do not drive yourself to the hospital. Document Released: 02/11/2012 Document Revised: 06/02/2013 Document Reviewed: 02/12/2013 ExitCare Patient Information 2014 [employer]. _____________________________________________________________________________________   Supplemental Discharge Instructions following  Loop Recorder Implantation  WOUND CARE   Keep the wound area clean and dry.  Do not get this area wet for one week. No showers for one week; you may shower on 11/24/2013.   The tape/steri-strips on your wound will fall off; do not pull them off.  No bandage is needed on the site.  DO  NOT apply any creams, oils, or ointments to the wound area.   If you notice any drainage or discharge from the wound, any swelling or bruising at the site, or you develop a fever > 101? F after you are discharged home, call the office at once. _____________________________________________________________________________________  [place] arranged with [place]

## 2013-11-18 NOTE — Progress Notes (Signed)
FMTS Attending Daily Note: Cristela Stalder MD [phone] pager office [phone] I  have seen and examined this patient, reviewed their chart. I have discussed this patient with the resident. I agree with the resident's findings, assessment and care plan. 

## 2013-11-18 NOTE — Progress Notes (Signed)
Stroke Team Progress Note  HISTORY [name] is an 68 y.o. male history of atrial flutter, hypertension, hyperlipidemia, diabetes mellitus and seizures, who was brought to the emergency room 03/22/2015for evaluation because his wife felt he experienced a change in mental status and wasn't acting himself. She noticed that he was somewhat less alert than usual and wanted to sleep more than usual. He suffered a fall yesterday that he attributes to his left knee buckling while going down a set of steps. He did not notice any weakness of his left lower extremity at any point before or after his fall. CT scan and MRI showed findings indicative of acute to subacute right frontal infarction. Blood breakdown products were noted. No frank hematoma noted, however. NIH stroke score was 0. Patient has been complaining of headache but tends to have chronic headaches with no real change in the past several days. Patient has been taking aspirin daily. Patient was not administerd TPA secondary to unknown last known well. He was admitted for further evaluation and treatment.  SUBJECTIVE Patient in bed. No family at bedside. He would like to go home.  OBJECTIVE Most recent Vital Signs: Filed Vitals:   11/17/13 2018 11/18/13 0019 11/18/13 0420 11/18/13 0800  BP: 116/63 116/71 122/66 112/66  Pulse: 76 72 80 80  Temp: 98.7 F (37.1 C) 98.6 F (37 C) 98.3 F (36.8 C) 98.6 F (37 C)  TempSrc: Tympanic Oral Oral Oral  Resp: 18 18 18 18   Height:      Weight:      SpO2: 95% 94% 94% 95%   CBG (last 3)   Recent Labs  11/17/13 1645 11/17/13 2105 11/18/13 0819  GLUCAP 193* 121* 126*    IV Fluid Intake:      MEDICATIONS  . aspirin  300 mg Rectal Daily   Or  . aspirin EC  325 mg Oral Daily  . atorvastatin  80 mg Oral Daily  . enoxaparin (LOVENOX) injection  40 mg Subcutaneous Q24H  . insulin aspart  0-9 Units Subcutaneous TID WC  . methocarbamol  500 mg Oral 3 times per day  . metoCLOPramide  5 mg  Oral TID AC & HS  . nebivolol  20 mg Oral BID  . pantoprazole  40 mg Oral Daily  . traZODone  150 mg Oral QHS   PRN:  acetaminophen-codeine, senna-docusate  Diet:    heart healthy carb modified Activity:  OOB with assistance, ambulate patient DVT Prophylaxis:  Lovenox 40 mg sq daily   CLINICALLY SIGNIFICANT STUDIES Basic Metabolic Panel:   Recent Labs Lab 11/14/13 1808 11/15/13 0639  NA 139 141  K 4.1 4.0  CL 96 102  CO2 27 23  GLUCOSE 147* 158*  BUN 26* 26*  CREATININE 1.14 0.95  CALCIUM 10.2 9.4   Liver Function Tests:   Recent Labs Lab 11/14/13 1808  AST 36  ALT 25  ALKPHOS 73  BILITOT 0.5  PROT 8.8*  ALBUMIN 4.4   CBC:   Recent Labs Lab 11/14/13 1808 11/15/13 0639 11/16/13 0930  WBC 12.4* 11.7* 9.9  NEUTROABS 9.4*  --   --   HGB 17.5* 15.8 15.3  HCT 52.0 46.6 45.2  MCV 98.3 99.1 99.3  PLT 272 243 222   Coagulation:   Recent Labs Lab 11/14/13 1808  LABPROT 13.1  INR 1.01   Cardiac Enzymes: No results found for this basename: CKTOTAL, CKMB, CKMBINDEX, TROPONINI,  in the last 168 hours Urinalysis:   Recent Labs Lab 11/14/13  [place]  LABSPEC 1.015  PHURINE 5.5  GLUCOSEU 100*  HGBUR NEGATIVE  BILIRUBINUR NEGATIVE  KETONESUR NEGATIVE  PROTEINUR NEGATIVE  UROBILINOGEN 0.2  NITRITE NEGATIVE  LEUKOCYTESUR NEGATIVE   Lipid Panel    Component Value Date/Time   CHOL 209* 10/29/2013 1611   TRIG 441* 10/29/2013 1611   HDL 36* 10/29/2013 1611   CHOLHDL 5.8 10/29/2013 1611   VLDL NOT CALC 10/29/2013 1611   LDLCALC NOT CALC 10/29/2013 1611   HgbA1C  Lab Results  Component Value Date   HGBA1C 6.8* 11/15/2013    Urine Drug Screen:     Component Value Date/Time   LABOPIA POSITIVE* 11/14/2013 1723   LABOPIA POSITIVE* 04/03/2012 0910   COCAINSCRNUR NONE DETECTED 11/14/2013 1723   COCAINSCRNUR NEGATIVE 04/03/2012 0910   LABBENZ NONE DETECTED 11/14/2013 1723   LABBENZ NEGATIVE 04/03/2012 0910   AMPHETMU NONE DETECTED 11/14/2013 1723    AMPHETMU NEGATIVE 04/03/2012 0910   THCU NONE DETECTED 11/14/2013 1723   LABBARB NONE DETECTED 11/14/2013 1723    Alcohol Level:   Recent Labs Lab 11/14/13 1808  ETH <11    CT Cervical Spine 11/14/2013   1. New hypodensity in the medial right frontal lobe, concerning for acute to subacute infarct. 2. No evidence of intracranial hemorrhage. 3. No evidence of acute osseous abnormality in the cervical spine.    CT of the brain  11/14/2013   1. New hypodensity in the medial right frontal lobe, concerning for acute to subacute infarct. 2. No evidence of intracranial hemorrhage. 3. No evidence of acute osseous abnormality in the cervical spine.   MRI of the brain  11/14/2013    Subacute infarction in the right frontal lobe. Swelling, laminar necrosis and low level blood product deposition. No frank hematoma.  Older right frontal cortical infarction adjacent to that.  Chronic small vessel changes elsewhere throughout the brain.    MRA of the brain  11/14/2013   Distal vessel atherosclerotic changes diffusely. The right anterior cerebral artery is patent but is smaller than the left.     2D Echocardiogram  EF 55-60% with no source of embolus.   Carotid Doppler  No evidence of hemodynamically significant internal carotid artery stenosis. Vertebral artery flow is antegrade.   TEE Normal LV size and systolic function, EF 60%. Normal RV size and systolic function. Trivial MR. No LA appendage thrombus. No PFO (negative bubble study). Grade III plaque in the descending thoracic aorta.   CXR  11/14/2013   Post CABG.  Question prior asbestos exposure.  No acute abnormalities.     EKG  normal sinus rhythm. For complete results please see formal report.   Therapy Recommendations HH PT, no OT  Physical Exam   Middle aged male not in distress.Awake alert. Afebrile. Head is nontraumatic. Neck is supple without bruit. Hearing is normal. Cardiac exam no murmur or gallop. Lungs are clear to auscultation. Distal  pulses are well felt. Neurological Exam ;   Mental Status:  Alert, oriented, thought content appropriate. Speech fluent without evidence of aphasia. Able to follow commands without difficulty.  Cranial Nerves:  II-Visual fields were normal.  III/IV/VI-Pupils were equal and reacted. Extraocular movements were full and conjugate.  V/VII-no facial numbness and no facial weakness.  VIII-normal.  X-normal speech and symmetrical palatal movement.  Motor: 5/5 bilaterally with normal tone and bulk  Sensory: Normal throughout.  Deep Tendon Reflexes: 1+ and symmetric.  Plantars: Flexor bilaterally  Cerebellar: Normal finger-to-nose testing.   ASSESSMENT Mr. Sebert  P Deterding is a 68 y.o. male presenting with a change in mental status "not acting right". Imaging confirms a right frontal infarct in this setting of an old right ACA infarct. Infarcts felt to be embolic secondary to unknown etiology, determined not to have a hx of atrial flutter. TEE negative. Loop recorder placed.  On aspirin 325 mg orally every day prior to admission. Now on aspirin 325 mg orally every day for secondary stroke prevention. Stroke work up underway.   hypertension Hyperlipidemia, LDL unable to calculate, on lipitor 80 mg daily PTA, now on Lipitor 80 mg, goal LDL < 100  Diabetes, HgbA1c 6.8,goal < 7.0 No Hx atrial flutter per  Dr. [place] Hx etoh use obstructive sleep apnea, does not use CPAP CAD  Hx seizure d/o  Hospital day # 4  TREATMENT/PLAN  Continue aspirin po or suppository daily for secondary stroke prevention.  Home health PT  No further stroke workup indicated.  Patient has a 10-15% risk of having another stroke over the next year, the highest risk is within 2 weeks of the most recent stroke/TIA (risk of having a stroke following a stroke or TIA is the same).  Ongoing risk factor control by Primary Care Physician  Stroke Service will sign off. Please call should any needs arise.  Follow up with Dr.  Leonie Man, [facility], in 2 months.  Dr. Leonie Man discussed diagnosis, prognosis,  treatment options and plan of care with family practice resident.    Burnetta Sabin, MSN, RN, ANVP-BC, AGPCNP-BC [facility] Pager: [phone] 11/18/2013 10:40 AM  I have personally obtained a history, examined the patient, evaluated imaging results, and formulated the assessment and plan of care. I agree with the above.  Antony Contras, MD   To contact Stroke Continuity provider, please refer to [email or web address] After hours, contact General Neurology

## 2013-11-18 NOTE — Progress Notes (Signed)
Physical Therapy Treatment Patient Details Name: [name] MRN: [id number] DOB: 1945-12-17 Today's Date: 12/08/13    History of Present Illness [name] is a 68 y.o. male presenting with subacute stroke . PMH is significant for CAD s/p CABG, HLD, HTN, h/o sz d/o, h/o a flutter, depression, h/o alcohol abuse, DM, and AKI. CT head with hypodensity in medial R frontal lobe, MRI with subacute infarction right frontal lobe    PT Comments    Pt with good balance, able to perform dynamic gait tasks without LOB, pt independent with gait, min guard with stairs due to knee instability.  Pt with no further acute PT needs, will benefit from HHPT for activity tolerance and knee strengthening.  Follow Up Recommendations  Home health PT     Equipment Recommendations  None recommended by PT    Recommendations for Other Services       Precautions / Restrictions Precautions Precautions: Fall Restrictions Weight Bearing Restrictions: No    Mobility  Bed Mobility               General bed mobility comments: pt up in recliner  Transfers Overall transfer level: Independent Equipment used: None                Ambulation/Gait Ambulation/Gait assistance: Independent Ambulation Distance (Feet): 250 Feet Assistive device: None       General Gait Details: pt with no LOB during gait in controlled environments with dynamic gait tasks (see DGI)   Stairs Stairs: Yes Stairs assistance: Min guard Stair Management: One rail Right;Alternating pattern;Forwards Number of Stairs: 12 General stair comments: min guard due to pt stating "my knee might give out"  pt aware of recommendation for supevision with stairs at home due to knee weakness  Wheelchair Mobility    Modified Rankin (Stroke Patients Only)       Balance     DGI 21/24                                Cognition Arousal/Alertness: Awake/alert Behavior During Therapy: WFL for tasks  assessed/performed Overall Cognitive Status: Within Functional Limits for tasks assessed                      Exercises      General Comments        Pertinent Vitals/Pain No c/o pain    Home Living                      Prior Function            PT Goals (current goals can now be found in the care plan section) Progress towards PT goals: Goals met/education completed, patient discharged from PT    Frequency  Other (Comment) (PT goals met, d/c from acute PT)    PT Plan      End of Session   Activity Tolerance: Patient tolerated treatment well Patient left: in chair;with call bell/phone within reach     Time: 9211-9417 PT Time Calculation (min): 13 min  Charges:  $Gait Training: 8-22 mins                    G Codes:      Nelva Hauk 12/08/2013, 8:12 AM

## 2013-11-22 NOTE — Discharge Summary (Signed)
Family Medicine Teaching Service  Discharge Note : Attending Kimorah Ridolfi MD Pager [phone] Office [phone] I have seen and examined this patient, reviewed their chart and discussed discharge planning wit the resident at the time of discharge. I agree with the discharge plan as above.  

## 2013-11-23 ENCOUNTER — Telehealth: Payer: Self-pay | Admitting: *Deleted

## 2013-11-23 ENCOUNTER — Telehealth: Payer: Self-pay

## 2013-11-23 NOTE — Telephone Encounter (Signed)
Patient left voicemail at [facility] stating that her husband was under Dr. Clydene Fake care following a recent stroke.  Dr. Leonie Man mentioned that he would like the patient to have a sleep study done and that it could be arranged at our sleep lab.  She is calling to follow up on that.  His face to face visit was 11/14/13 during his hospital admission. Patient has Medicare.   Dr. Leonie Man documents the patient has a history of OSA but does not use his CPAP.  A sleep study is needed for secondary stroke prevention.  Interestingly there is a phone encounter just days before his stroke to [facility] office where the patient is asking for a referral for sleep study.  This was created and assigned to [facility], the notes state the patient can have the services at any location.  The patient is scheduled to see Dr. Leonie Man on 02/24/14 and he is on the wait list for a sooner appointment.

## 2013-11-23 NOTE — Telephone Encounter (Signed)
[name] from [facility] called and states she needs the results of [name]'s A1C.. She can be reached at [phone]. Thank you

## 2013-11-23 NOTE — Telephone Encounter (Signed)
Pat called back.  She says they were called by the hospital on Friday but they would like to do the study here.   She told them she would have to get back to them.  Patient's PCP is [name].  He was initially tested in [facility] and followed by Dr. Ermalene Postin, that was "years ago".  Stated he fought with CPAP for some time and finally gave up.  About 2 years ago, he was fit for an oral appliance device for OSA treatment by a dentist who didn't do any sleep testing, just provided the device.  She says it wasn't effective.  She witnesses apneas, snoring, snorts, noisy breathing.  She states Jakeb sleeps all day.  He struggles with daytime sleepiness.  She would like to have the study done as soon as possible but will do whatever Dr. Leonie Man recommends.  I explained I was just waiting to hear back from him and WHEN he wanted patient to do this.  Told her I would call when I received a response.  She is anxious to meet with Dr. Leonie Man as she has many questions about everything.  Her voice became quite emotional on the phone when she talked.

## 2013-11-23 NOTE — Telephone Encounter (Signed)
A1C is 6.8 according to hospital notes. Left detailed message on tonya's voice mail.

## 2013-11-23 NOTE — Telephone Encounter (Signed)
Called and LM on Pat's voicemail - returning her call.    Stated info below and said we would be happy to take care of Yerik' sleep study.  Asked her to call me back to discuss.    I also mentioned an existing referral to [facility] which and asked if they have been contacted by them yet.  He has an appointment with Dr. Leonie Man in July.  I will ask Dr. Leonie Man if he wants this patient to have sleep study prior to his July appointment.  We may be able to use the admission as a face to face visit to meet Medicare's requirement.  There is currently no referral or order from Dr. Leonie Man in the system for sleep study, just a mention of needing to obtain one.  So I will check with Dr. Leonie Man next to find out when he wants patient to do this.  We need more info about his existing diagnosis of OSA if possible and sleep symptoms for Medicare coverage.

## 2013-11-23 NOTE — Telephone Encounter (Signed)
i called and left message to call me back.

## 2013-11-24 ENCOUNTER — Telehealth: Payer: Self-pay

## 2013-11-24 NOTE — Telephone Encounter (Signed)
Alonna Minium from [facility] called stated [name] fell on Sunday while walking into [employer], [name] stated family was around to help him and he did not have any injuries. Patient do have a physical therapist that has been ordered to help.

## 2013-11-25 ENCOUNTER — Ambulatory Visit (INDEPENDENT_AMBULATORY_CARE_PROVIDER_SITE_OTHER): Payer: Medicare Other | Admitting: Emergency Medicine

## 2013-11-25 ENCOUNTER — Ambulatory Visit (INDEPENDENT_AMBULATORY_CARE_PROVIDER_SITE_OTHER): Payer: Medicare Other | Admitting: *Deleted

## 2013-11-25 VITALS — BP 118/70 | HR 74 | Temp 98.4°F | Resp 18 | Ht 64.0 in | Wt 214.2 lb

## 2013-11-25 DIAGNOSIS — I6350 Cerebral infarction due to unspecified occlusion or stenosis of unspecified cerebral artery: Secondary | ICD-10-CM | POA: Diagnosis not present

## 2013-11-25 DIAGNOSIS — I639 Cerebral infarction, unspecified: Secondary | ICD-10-CM

## 2013-11-25 DIAGNOSIS — I2510 Atherosclerotic heart disease of native coronary artery without angina pectoris: Secondary | ICD-10-CM

## 2013-11-25 DIAGNOSIS — E119 Type 2 diabetes mellitus without complications: Secondary | ICD-10-CM | POA: Diagnosis not present

## 2013-11-25 LAB — BASIC METABOLIC PANEL
BUN: 21 mg/dL (ref 6–23)
CO2: 28 meq/L (ref 19–32)
Calcium: 8.6 mg/dL (ref 8.4–10.5)
Chloride: 101 mEq/L (ref 96–112)
Creat: 0.92 mg/dL (ref 0.50–1.35)
Glucose, Bld: 97 mg/dL (ref 70–99)
Potassium: 4 mEq/L (ref 3.5–5.3)
SODIUM: 138 meq/L (ref 135–145)

## 2013-11-25 LAB — GLUCOSE, POCT (MANUAL RESULT ENTRY): POC Glucose: 84 mg/dl (ref 70–99)

## 2013-11-25 LAB — MDC_IDC_ENUM_SESS_TYPE_INCLINIC

## 2013-11-25 NOTE — Progress Notes (Signed)
This chart was scribed for [name] A. Jacob Farrier, MD by Stacy Gardner, [facility] Scribe. The patient was seen in room and the patient's care was started at 5:11 PM.   HPI HPI Comments: [name] is a 68 y.o. male who arrives to the [facility] for a follow up. Pt was admitted 3/27 for a stroke. Upon discharge pt . He is eating normally. Pt is concerned about difficulty of gait due to weakness and feeling fatigued. He started PT at home today. He is in the progress of having a stress test performed .Per wife complains that he hiccups at night and it is to the extent where he is gurgling. Patient was hospitalized with a 6 cm acute stroke in the right frontal area.   Pt is currently taking testosterone as rx by Dr. Amalia Hailey. Pt reports his glucose levels 87-181 and it was in the 150's yesterday. He was on insulin while in the hospital. He did was not instructed on insulin use after being discharged from the hospital. Pt's wife request more information for nutritional courses to get a better understanding of DM. He is not taking Metformin due to "kidney flare up" for the last year and a half. Per wife complains that he hiccups at night and it is to the extent where he is gurgling.  Review of Systems  Unable to perform ROS Constitutional: Positive for malaise/fatigue. Negative for weight loss.  Neurological: Positive for weakness.    Physical Exam Pupils are equal and reactive to light. His speech is somewhat slow. He has asymmetrical small. I did not elicit any focal upper or lower extremity weakness   Filed Vitals:   11/25/13 1645  BP: 118/70  Pulse: 74  Temp: 98.4 F (36.9 C)  Resp: 18   Results for orders placed in visit on 11/25/13  GLUCOSE, POCT (MANUAL RESULT ENTRY)      Result Value Ref Range   POC Glucose 84  70 - 99 mg/dl    Assessment  COORDINATION OF CARE:  5:11 PM Discussed course of care with pt and his wife which includes laboratory  tests. I will give pt more information about nutritional courses.  Pt understands and agrees.    Plan  his sugar today was 84. Referral made for nutritional counseling. She will contact Dr. Leonie Man  so he can be seen in followup for his stroke. He had his loop recorder taken out today. He is currently having physical therapy at home. He will continue his home physical therapy.No change in treatment of his diabetes. He is on Januvia at present they're asking for treatment of his hiccups which I declined to do because he has been under the care of which forced back to this problem. I advised them to contact them for structures. He is scheduled to see Dr. Leonie Man for stroke evaluation.Marland Kitchen

## 2013-11-25 NOTE — Progress Notes (Signed)
Wound check ILR.  Steri strip remove, no redness or edema.  Patient to follow up via [facility].  No episodes noted.

## 2013-12-01 ENCOUNTER — Ambulatory Visit: Payer: Medicare Other | Admitting: Neurology

## 2013-12-02 ENCOUNTER — Other Ambulatory Visit: Payer: Self-pay

## 2013-12-07 ENCOUNTER — Encounter: Payer: Self-pay | Admitting: Neurology

## 2013-12-07 ENCOUNTER — Ambulatory Visit (INDEPENDENT_AMBULATORY_CARE_PROVIDER_SITE_OTHER): Payer: Medicare Other | Admitting: Neurology

## 2013-12-07 VITALS — BP 110/72 | HR 70 | Ht 64.0 in | Wt 215.0 lb

## 2013-12-07 DIAGNOSIS — E669 Obesity, unspecified: Secondary | ICD-10-CM | POA: Diagnosis not present

## 2013-12-07 DIAGNOSIS — I6340 Cerebral infarction due to embolism of unspecified cerebral artery: Secondary | ICD-10-CM

## 2013-12-07 DIAGNOSIS — I2510 Atherosclerotic heart disease of native coronary artery without angina pectoris: Secondary | ICD-10-CM | POA: Diagnosis not present

## 2013-12-07 DIAGNOSIS — G4733 Obstructive sleep apnea (adult) (pediatric): Secondary | ICD-10-CM | POA: Diagnosis not present

## 2013-12-07 DIAGNOSIS — I639 Cerebral infarction, unspecified: Secondary | ICD-10-CM | POA: Insufficient documentation

## 2013-12-07 NOTE — Progress Notes (Signed)
[facility] [address] [place]. [place] [address] [phone]       OFFICE FOLLOW-UP NOTE  [name] Date of Birth:  11-04-1945 Medical Record Number:  [id number]   HPI: 60 year Caucasian male seen for.firstoffice visit following hospital admission for stroke. He was admitted on 11/18/13 with sudden onset of left leg weakness and tripping and falling. Head CT scan showed a subacute right frontal infarct an MRI subsequently confirmed that. MRA showed no large vessel stenosis and carotid dopplers were unremarkable.There is a questionable history of atrial arrhythmia but documented on his problem list  But  after extensive review of his prior medical records and discussion with family no definite atrial fibrillation was documented patient has never been on anticoagulation  . Hence he  got a complete workup including transthoracic echo which was normal and TEE which showed no obvious cardiac source of embolism or PFO. He had an implantable loop recorder placed to look for paroxysmal atrial fibrillation. He was started on aspirin for stroke prevention. Vascular cycle and if) obesity, sleep apnea, attention, hyperlipidemia and diabetes. Patient congenital morning will regular sinus rhythm. Patient states his general condition is not having recurrent stroke or chest symptoms clearly skull has been well excreted recorder has so far not recorded any atrial fibrillation. Patient does have sleep apnea but states that he has not tolerated CPAP in the past. He admits to restless sleep, snoring feeling excessively tired during the day. His wife in fact is scheduled sleep in the same room. Patient's wife is concerned about his ability to drive and she feels his concentration is not good and he gets confused at times. Patient to have has finished home physical occupational therapy.  ROS:   14 system review of systems is positive for fatigue, leg swelling, hearing loss, trouble swallowing,  cough, snoring, incontinence, importance, aching muscles, memory loss, headache, difficulty swallowing, anxiety, depression, not enough sleep, decreased energy, and disinterest in activities, insomnia, sleepiness and snoring.  PMH:  Past Medical History  Diagnosis Date  . Coronary artery disease     a. Low level exercise Lex MV 3/14: low risk, EF 62%, inf defect 2/2 diaph attenuation vs artifiact, small area of scar possible, no ischemia  . Hyperlipidemia   . Hypertension   . History of seizure disorder   . History of atrial flutter   . Depression   . Back pain     persistent  . Hearing loss   . Diverticulitis   . H/O alcohol abuse   . Diabetes mellitus     controled by diet  . GERD (gastroesophageal reflux disease)   . Seizures   . Headache(784.0)   . Arthritis     of spine  . Sleep apnea     does not use CPAP  . Hx of echocardiogram     Echo 7/14:  Mod LVH, EF 55-60%, Gr 1 DD, mild MR, PASP 36  . AKI (acute kidney injury)     a. 6/14: resolved after d/c ARB;  b. RA U/S 7/14: no RA stenosis  . Atrial flutter   . Stroke     Social History:  History   Social History  . Marital Status: Married    Spouse Name: [name]    Number of Children: 2  . Years of Education: college   Occupational History  . 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     Social History Main Topics  .  Smoking status: Never Smoker   . Smokeless tobacco: Never Used  . Alcohol Use: No     Comment: rare beer  . Drug Use: No  . Sexual Activity: Not Currently   Other Topics Concern  . Not on file   Social History Narrative   Patient lives at home with  wife      Patient drinks 2 cups of coffee a day.    Medications:   Current Outpatient Prescriptions on File Prior to Visit  Medication Sig Dispense Refill  . acetaminophen-codeine (TYLENOL #3) 300-30 MG per tablet TAKE 1 TABLET BY MOUTH EVERY 8 hours as needed for pain Ok to fill on 11/10/13.  40 tablet  0  . aspirin 325 MG tablet Take 325  mg by mouth daily.      Marland Kitchen atorvastatin (LIPITOR) 80 MG tablet Take 1 tablet (80 mg total) by mouth daily.  30 tablet  11  . furosemide (LASIX) 20 MG tablet Take 1 or 2 daily as needed  60 tablet  5  . ibuprofen (ADVIL,MOTRIN) 200 MG tablet Take 400 mg by mouth every 6 (six) hours as needed for moderate pain.      Marland Kitchen loperamide (IMODIUM) 2 MG capsule Take 6 mg by mouth daily as needed for diarrhea or loose stools. Per bottle as needed      . methocarbamol (ROBAXIN) 750 MG tablet Take 750 mg by mouth 3 (three) times daily.      . metoCLOPramide (REGLAN) 5 MG tablet 1 tablet before each meal and at bedtime      . nebivolol (BYSTOLIC) 10 MG tablet Take 20 mg by mouth 2 (two) times daily.      . nitroGLYCERIN (NITROSTAT) 0.4 MG SL tablet Place 1 tablet (0.4 mg total) under the tongue every 5 (five) minutes as needed. For chest pain  25 tablet  6  . omeprazole (PRILOSEC) 20 MG capsule Take 20 mg by mouth 2 (two) times daily before a meal.      . sertraline (ZOLOFT) 25 MG tablet Take 1 tablet (25 mg total) by mouth daily. Increase to two tablet daily after 1 week.  60 tablet  0  . sitaGLIPtin (JANUVIA) 100 MG tablet Take 100 mg by mouth daily.      Marland Kitchen testosterone cypionate (DEPOTESTOTERONE CYPIONATE) 200 MG/ML injection Inject into the muscle every 14 (fourteen) days.      . traZODone (DESYREL) 150 MG tablet Take 150 mg by mouth at bedtime.       No current facility-administered medications on file prior to visit.    Allergies:   Allergies  Allergen Reactions  . Cephalexin     Unknown  . Doxycycline     Unknown  . Keflex [Cephalexin]   . Lac Bovis Other (See Comments)    lactose intolerant  . Pentazocine Lactate     He passed out- he had a seizure.  This occurred around 2000    Physical Exam General: obese middle aged Caucasian male seated, in no evident distress Head: head normocephalic and atraumatic. Orohparynx benign Neck: supple with no carotid or supraclavicular  bruits Cardiovascular: regular rate and rhythm, no murmurs Musculoskeletal: no deformity Skin:  no rash/petichiae Vascular:  Normal pulses all extremities Filed Vitals:   12/07/13 1242  BP: 110/72  Pulse: 70   Neurologic Exam Mental Status: Awake and fully alert. Oriented to place and time. Recent and remote memory intact. Attention span, concentration and fund of knowledge appropriate. Mood and affect appropriate. MMSE  28/30 with AF 5. Clock Drawing 4/4. Geriatric Depression scale 7  Cranial Nerves: Fundoscopic exam reveals sharp disc margins. Pupils equal, briskly reactive to light. Extraocular movements full without nystagmus. Visual fields full to confrontation. Hearing intact. Facial sensation intact. Face, tongue, palate moves normally and symmetrically.  Motor: Normal bulk and tone. Normal strength in all tested extremity muscles. Sensory.: intact to touch and pinprick and vibratory sensation.  Coordination: Rapid alternating movements normal in all extremities. Finger-to-nose and heel-to-shin performed accurately bilaterally. Gait and Station: Arises from chair without difficulty. Stance is normal. Gait demonstrates normal stride length and balance . Able to heel, toe and tandem walk without difficulty.  Reflexes: 1+ and symmetric. Toes downgoing.   NIHSS 0 Modified Rankin  1   ASSESSMENT: 10 year patient with right frontal infarct of embolic etiology in March 2015 without definite identified source of embolism. Vascular risk factors half hypertension, diabetes , sleep apnea and hyperlipidemia.    PLAN: I had a long discussion with the patient and his wife regarding his recent stroke, discuss results of imaging and evaluation the hospital, stroke risk factors, importance of secondary stroke prevention and answered questions. Continue aspirin for stroke prevention and refer to Dr. Rexene Alberts for second opinion for his sleep apnea management. Maintain strict control of hypertension with  blood pressure goal below 130/90, lipids with an LDL cholesterol goal below 100 mg percent. I advised him to diet, exercise regularly and maintain ideal body weight. He made possibly consider participating in the [place] trial if interested and will be given information to review at home. The patient's wife has concerns about his driving hence we'll refer him to drivers ed for an evaluation. Return for followup in 3 months with Jeani Hawking, NP or earlier if necessary    Note: This document was prepared with digital dictation and possible smart phrase technology. Any transcriptional errors that result from this process are unintentional

## 2013-12-07 NOTE — Patient Instructions (Signed)
I had a long discussion with the patient and his wife regarding his recent stroke, discuss results of imaging and evaluation the hospital, stroke risk factors, importance of secondary stroke prevention and answered questions. Continue aspirin for stroke prevention and refer to Dr. Rexene Alberts for second opinion for his sleep apnea management. Maintain strict control of hypertension with blood pressure goal below 130/90, lipids with an LDL cholesterol goal below 100 mg percent. I advised him to diet, exercise regularly and maintain ideal body weight. He made possibly consider participating in the [place] trial if interested and will be given information to review at home. The patient's wife has concerns about his driving hence we'll refer him to drivers ed for an evaluation. Return for followup in 3 months with Jeani Hawking, NP or earlier if necessary

## 2013-12-10 ENCOUNTER — Encounter: Payer: Self-pay | Admitting: Internal Medicine

## 2013-12-13 DIAGNOSIS — M25569 Pain in unspecified knee: Secondary | ICD-10-CM | POA: Diagnosis not present

## 2013-12-13 DIAGNOSIS — M2390 Unspecified internal derangement of unspecified knee: Secondary | ICD-10-CM | POA: Diagnosis not present

## 2013-12-13 DIAGNOSIS — M1710 Unilateral primary osteoarthritis, unspecified knee: Secondary | ICD-10-CM | POA: Diagnosis not present

## 2013-12-13 DIAGNOSIS — M2240 Chondromalacia patellae, unspecified knee: Secondary | ICD-10-CM | POA: Diagnosis not present

## 2013-12-15 ENCOUNTER — Other Ambulatory Visit: Payer: Self-pay | Admitting: *Deleted

## 2013-12-15 NOTE — Telephone Encounter (Signed)
Left message for patient's spouse that I had spoken to Dr. Leonie Man and he had given the okay to have a sleep study done any time if needed.  (He was in the office last week visiting with Dr. Leonie Man and Dr. Leonie Man referred him to Dr. Rexene Alberts for sleep disorders evaluation and management - he has a prior history of sleep apnea and CPAP use, but he has been unable to tolerate therapy). I mentioned that we may be able to offer desensitization to him in order to improve his chances of success in the future as far as CPAP treatment is concerned.  Asked her to call me to discuss further and I can setup an appt with Dr. Rexene Alberts at that time as well.

## 2013-12-16 ENCOUNTER — Ambulatory Visit (INDEPENDENT_AMBULATORY_CARE_PROVIDER_SITE_OTHER): Payer: Medicare Other | Admitting: Emergency Medicine

## 2013-12-16 ENCOUNTER — Other Ambulatory Visit: Payer: Self-pay | Admitting: *Deleted

## 2013-12-16 VITALS — BP 98/60 | HR 80 | Temp 97.9°F | Resp 16 | Wt 216.0 lb

## 2013-12-16 DIAGNOSIS — H6120 Impacted cerumen, unspecified ear: Secondary | ICD-10-CM

## 2013-12-16 DIAGNOSIS — E1149 Type 2 diabetes mellitus with other diabetic neurological complication: Secondary | ICD-10-CM | POA: Diagnosis not present

## 2013-12-16 LAB — GLUCOSE, POCT (MANUAL RESULT ENTRY): POC Glucose: 114 mg/dl — AB (ref 70–99)

## 2013-12-16 NOTE — Patient Instructions (Signed)
Decrease your Bystolic to one tablet twice a day. No change in your other medications. I would suggest you not go on testosterone at the present time.

## 2013-12-16 NOTE — Progress Notes (Addendum)
Subjective:    Patient ID: [name], male    DOB: 1946-06-29, 68 y.o.   MRN: [id number] This chart was scribed for Jacob Queen, MD by [name], ED Scribe. This patient was seen in room 10 and the patient's care was started at 8:13 AM.  Chief Complaint  Patient presents with   Diabetes   Cerebrovascular Accident   clear ears    HPI HPI Comments: [name] is a 68 y.o. male with a h/o CVA, DM, HTN, CAD who [facility] the [facility] for a follow up on a recent CVA. Pt was recently admitted for a stroke and saw a neurologist [name] a few days ago. Wife states she and pt are dealing with a lot of stress since the stroke. Wife reports pt is having increased agitation. Pt reports having a hard time with not being able to drive anymore. Wife reports pt has not been listening to him e.g. when she tells him to wear pull ups he does not do it and has an accident.   Pt states he feels weak all the time but denies dizzy spells. Wife states his legs wobble and give out sometimes when he walks.   He states he is thinking about getting back on testosterone. States [name] said it will not cause another CVA.  Pt states he wants to have his ears checked for wax because he is going to have a hearing test done next week.  PCP - [name], MD  Past Medical History  Diagnosis Date   Coronary artery disease     a. Low level exercise Lex MV 3/14: low risk, EF 62%, inf defect 2/2 diaph attenuation vs artifiact, small area of scar possible, no ischemia   Hyperlipidemia    Hypertension    History of seizure disorder    History of atrial flutter    Depression    Back pain     persistent   Hearing loss    Diverticulitis    H/O alcohol abuse    Diabetes mellitus     controled by diet   GERD (gastroesophageal reflux disease)    Seizures    Headache(784.0)    Arthritis     of spine   Sleep apnea     does not use CPAP   Hx of  echocardiogram     Echo 7/14:  Mod LVH, EF 55-60%, Gr 1 DD, mild MR, PASP 36   AKI (acute kidney injury)     a. 6/14: resolved after d/c ARB;  b. RA U/S 7/14: no RA stenosis   Atrial flutter    Stroke    Current Outpatient Prescriptions on File Prior to Visit  Medication Sig Dispense Refill   acetaminophen-codeine (TYLENOL #3) 300-30 MG per tablet TAKE 1 TABLET BY MOUTH EVERY 8 hours as needed for pain Ok to fill on 11/10/13.  40 tablet  0   aspirin 325 MG tablet Take 325 mg by mouth daily.       atorvastatin (LIPITOR) 80 MG tablet Take 1 tablet (80 mg total) by mouth daily.  30 tablet  11   furosemide (LASIX) 20 MG tablet Take 1 or 2 daily as needed  60 tablet  5   ibuprofen (ADVIL,MOTRIN) 200 MG tablet Take 400 mg by mouth every 6 (six) hours as needed for moderate pain.       loperamide (IMODIUM) 2 MG capsule Take 6 mg by mouth daily  as needed for diarrhea or loose stools. Per bottle as needed       methocarbamol (ROBAXIN) 750 MG tablet Take 750 mg by mouth 3 (three) times daily.       metoCLOPramide (REGLAN) 5 MG tablet 1 tablet before each meal and at bedtime       nebivolol (BYSTOLIC) 10 MG tablet Take 20 mg by mouth 2 (two) times daily.       nitroGLYCERIN (NITROSTAT) 0.4 MG SL tablet Place 1 tablet (0.4 mg total) under the tongue every 5 (five) minutes as needed. For chest pain  25 tablet  6   omeprazole (PRILOSEC) 20 MG capsule Take 20 mg by mouth 2 (two) times daily before a meal.       sertraline (ZOLOFT) 25 MG tablet Take 1 tablet (25 mg total) by mouth daily. Increase to two tablet daily after 1 week.  60 tablet  0   sitaGLIPtin (JANUVIA) 100 MG tablet Take 100 mg by mouth daily.       testosterone cypionate (DEPOTESTOTERONE CYPIONATE) 200 MG/ML injection Inject into the muscle every 14 (fourteen) days.       traZODone (DESYREL) 150 MG tablet Take 150 mg by mouth at bedtime.       No current facility-administered medications on file prior to visit.    Allergies  Allergen Reactions   Cephalexin     Unknown   Doxycycline     Unknown   Keflex [Cephalexin]    Lac Bovis Other (See Comments)    lactose intolerant   Pentazocine Lactate     He passed out- he had a seizure.  This occurred around 2000      Review of Systems  Musculoskeletal: Positive for gait problem.  Neurological: Positive for weakness. Negative for dizziness.  Psychiatric/Behavioral: Positive for agitation.       Objective:   Physical Exam CONSTITUTIONAL: Well developed/well nourished HEAD: Normocephalic/atraumatic EYES: EOMI/PERRL ENMT: Mucous membranes moist. Wax in left external auditory canal. Right ear normal.  NECK: supple no meningeal signs SPINE:entire spine nontender CV: S1/S2 noted, no murmurs/rubs/gallops noted LUNGS: Lungs are clear to auscultation bilaterally, no apparent distress ABDOMEN: soft, nontender, no rebound or guarding GU:no cva tenderness NEURO: Dysarthric speech. Walks with the aid of a walker. Unsteady gait.  EXTREMITIES: pulses normal, full ROM SKIN: warm, color normal PSYCH: no abnormalities of mood noted   Filed Vitals:   12/16/13 0812  BP: 98/60  Pulse: 80  Temp: 97.9 F (36.6 C)  TempSrc: Oral  Resp: 16  Weight: 216 lb (97.977 kg)  SpO2: 94%   Results for orders placed in visit on 12/16/13  GLUCOSE, POCT (MANUAL RESULT ENTRY)      Result Value Ref Range   POC Glucose 114 (*) 70 - 99 mg/dl        Assessment & Plan:  **Disclaimer: This note was dictated with voice recognition software. Similar sounding words can inadvertently be transcribed and this note may contain transcription errors which may not have been corrected upon publication of note.**  Patient having weak spells post CVA. At times his legs feel like it will buckle. He does walk with the aid of a walker. Here his systolic blood pressure was 98. I decreased his Bystolic to one tablet twice a day and sent a note to Dr. [place] regarding this so he  can be sure this is appropriate. I declined to give him testosterone because of the recent problems with agitation and at the present time there appears  to be a significant amount of stress going on related to the patient's recent stroke sugar is controlled at 140. I was not allowed to change the node over perform the history and physical examination and documentation was performed by scribe

## 2013-12-17 ENCOUNTER — Telehealth: Payer: Self-pay | Admitting: Neurology

## 2013-12-17 NOTE — Telephone Encounter (Signed)
I called and left a message for the patient to callback to the office to schedule a sleep consult. with Dr. Rexene Alberts.

## 2013-12-18 ENCOUNTER — Other Ambulatory Visit: Payer: Self-pay | Admitting: Family Medicine

## 2013-12-18 ENCOUNTER — Other Ambulatory Visit (HOSPITAL_COMMUNITY): Payer: Self-pay | Admitting: Emergency Medicine

## 2013-12-18 NOTE — Telephone Encounter (Signed)
Patients wife called on behalf of spouse because he has been out of his zoloft sertraline (ZOLOFT) 25 MG tablet medication for quit some time and pharmacist says that he may have to start back at one pill to later two. Please advise and look into this. He was last seen 12/16/13 with Dr. [name]. Patient did not request to get a refill during office visit, spouse says they didn't know and she is now taking care of his medication management because he is unable to due to a recent stroke.  Please call when refilled he is completely out of medication. Thank you  [id number] - [place], [place] - [address] AT [place]

## 2013-12-18 NOTE — Telephone Encounter (Signed)
Called him- the zoloft was refilled by [facility].  Explained that I cannot have him on codeine now as he recently had a stroke.  He states understanding

## 2013-12-20 ENCOUNTER — Ambulatory Visit (INDEPENDENT_AMBULATORY_CARE_PROVIDER_SITE_OTHER): Payer: Medicare Other | Admitting: *Deleted

## 2013-12-20 DIAGNOSIS — I6350 Cerebral infarction due to unspecified occlusion or stenosis of unspecified cerebral artery: Secondary | ICD-10-CM | POA: Diagnosis not present

## 2013-12-20 DIAGNOSIS — I639 Cerebral infarction, unspecified: Secondary | ICD-10-CM

## 2013-12-20 LAB — MDC_IDC_ENUM_SESS_TYPE_REMOTE

## 2013-12-22 ENCOUNTER — Encounter: Payer: Self-pay | Admitting: Neurology

## 2013-12-22 ENCOUNTER — Ambulatory Visit (INDEPENDENT_AMBULATORY_CARE_PROVIDER_SITE_OTHER): Payer: Medicare Other | Admitting: Neurology

## 2013-12-22 VITALS — BP 113/67 | HR 72 | Ht 64.0 in | Wt 215.0 lb

## 2013-12-22 DIAGNOSIS — G4700 Insomnia, unspecified: Secondary | ICD-10-CM

## 2013-12-22 DIAGNOSIS — I639 Cerebral infarction, unspecified: Secondary | ICD-10-CM

## 2013-12-22 DIAGNOSIS — G4761 Periodic limb movement disorder: Secondary | ICD-10-CM

## 2013-12-22 DIAGNOSIS — I2510 Atherosclerotic heart disease of native coronary artery without angina pectoris: Secondary | ICD-10-CM | POA: Diagnosis not present

## 2013-12-22 DIAGNOSIS — R197 Diarrhea, unspecified: Secondary | ICD-10-CM

## 2013-12-22 DIAGNOSIS — R351 Nocturia: Secondary | ICD-10-CM

## 2013-12-22 DIAGNOSIS — K219 Gastro-esophageal reflux disease without esophagitis: Secondary | ICD-10-CM | POA: Diagnosis not present

## 2013-12-22 DIAGNOSIS — I6350 Cerebral infarction due to unspecified occlusion or stenosis of unspecified cerebral artery: Secondary | ICD-10-CM

## 2013-12-22 DIAGNOSIS — K529 Noninfective gastroenteritis and colitis, unspecified: Secondary | ICD-10-CM

## 2013-12-22 DIAGNOSIS — E669 Obesity, unspecified: Secondary | ICD-10-CM

## 2013-12-22 DIAGNOSIS — N529 Male erectile dysfunction, unspecified: Secondary | ICD-10-CM | POA: Diagnosis not present

## 2013-12-22 DIAGNOSIS — G4733 Obstructive sleep apnea (adult) (pediatric): Secondary | ICD-10-CM

## 2013-12-22 DIAGNOSIS — E291 Testicular hypofunction: Secondary | ICD-10-CM | POA: Diagnosis not present

## 2013-12-22 NOTE — Patient Instructions (Addendum)
Based on your symptoms and your exam I believe you are at risk for obstructive sleep apnea or OSA, and I think we should proceed with a sleep study to determine whether you do or do not have OSA and how severe it is. If you have more than mild OSA, I want you to consider treatment with CPAP. Please remember, the risks and ramifications of moderate to severe obstructive sleep apnea or OSA are: Cardiovascular disease, including congestive heart failure, stroke, difficult to control hypertension, arrhythmias, and even type 2 diabetes has been linked to untreated OSA. Sleep apnea causes disruption of sleep and sleep deprivation in most cases, which, in turn, can cause recurrent headaches, problems with memory, mood, concentration, focus, and vigilance. Most people with untreated sleep apnea report excessive daytime sleepiness, which can affect their ability to drive. Please do not drive if you feel sleepy.  I will see you back after your sleep study to go over the test results and where to go from there. We will call you after your sleep study and to set up an appointment at the time.  Please remember to try to maintain good sleep hygiene, which means: Keep a regular sleep and wake schedule, try not to exercise or have a meal within 2 hours of your bedtime, try to keep your bedroom conducive for sleep, that is, cool and dark, without light distractors such as an illuminated alarm clock, and refrain from watching TV right before sleep or in the middle of the night and do not keep the TV or radio on during the night. Also, try not to use or play on electronic devices at bedtime, such as your cell phone, tablet PC or laptop. If you like to read at bedtime on an electronic device, try to dim the background light as much as possible. Do not eat in the middle of the night.   Please reduce your soda intake and drink more water!  You may be able to tolerate CPAP this time around.

## 2013-12-22 NOTE — Progress Notes (Signed)
Subjective:    Patient ID: [name] is a 68 y.o. male.  HPI    Star Age, MD, PhD [facility] [address], [address] [place], [place] [address]  Dear [name],   I saw your patient, [name], upon your kind request in my clinic today for initial consultation of a sleep disorder, in particular his prior diagnosis of sleep apnea. The patient is accompanied by his wife today. As you know, [name] is a very pleasant 68 year old left-handed gentleman with an underlying complex medical history of heart disease, hyperlipidemia, obesity, hypertension, history of seizure disorder, history of atrial flutter, depression, chronic back pain, hearing loss, diverticulitis, history of alcohol and prescription medicine abuse, diabetes, reflux disease, recurrent headaches, and stroke x 3, first in the 90s, including a recent admission to [facility] on 96/75/9163 for embolic stroke for which he presented with an sudden onset of left leg weakness and incoordination and fall. CT head at the time showed a subacute right frontal infarct an MRI confirmed the stroke. MRA showed no large vessel stenosis and carotid Doppler studies were unremarkable. He was not diagnosed with atrial fibrillation, transthoracic echocardiogram was performed, which was fairly unremarkable and TEE showed no obvious cardiac source of embolism. He had an implantable loop recorder placed to detect paroxysmal A. fib and was started on aspirin for secondary stroke prevention. His stroke risk factors include obesity, hypertension, hyperlipidemia, diabetes and sleep apnea. He was previously diagnosed with obstructive sleep apnea over 5 years ago and tried CPAP, but was not able to tolerate CPAP, as he kept pulling the mask off. He then tried a dental appliance for about 2 years, which may have helped some and he did have several home sleep tests, per his dentist, but eventually, he stopped using the  appliance, and it has been lost or chewed up by the dog. He reports snoring, daytime somnolence, and restless sleep. He has trouble going to sleep and staying asleep. He has been on Trazodone 150 mg from the [place], for the past year or two, and it works variably well for him. Prior to that he was tried on Ambien, but started taking too much and had to come off of it. He quit drinking EtOH in 10/13. He drinks caffeine daily, 1 cup of coffee in the morning, then more than 3 cans of [facility], as late as bedtime.  His bedtime is not set, between 10 PM to MN. He falls asleep usually within 1-3 hours. He wakes up around 4-6 AM.  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 He was on Halcion for years many years ago.  He has leg shakiness since his stroke and while he denies frank RLS Sx, he does kick in his sleep. He snores and has witnessed apneas and wakes up gasping. He has hiccups in his sleep and makes gurgling sounds.  He has nocturia and has to go to the bathroom for chronic diarrhea, since his colon surgery.  He reports residual L sided weakness since his stroke.  There are some additional concerns his wife mentions which includes the fact that he has perhaps taken some of her Xanax at times, and has also taken more trazodone than he is prescribed for as he tends to sometimes run out of it before he is up for a refill. Of note, he does go to AA and substance abuse meetings through the [place].  His Past Medical History  Is Significant For: Past Medical History  Diagnosis Date  . Coronary artery disease     a. Low level exercise Lex MV 3/14: low risk, EF 62%, inf defect 2/2 diaph attenuation vs artifiact, small area of scar possible, no ischemia  . Hyperlipidemia   . Hypertension   . History of seizure disorder   . History of atrial flutter   . Depression   . Back pain     persistent  . Hearing loss   . Diverticulitis   . H/O alcohol abuse   . Diabetes mellitus     controled  by diet  . GERD (gastroesophageal reflux disease)   . Seizures   . Headache(784.0)   . Arthritis     of spine  . Sleep apnea     does not use CPAP  . Hx of echocardiogram     Echo 7/14:  Mod LVH, EF 55-60%, Gr 1 DD, mild MR, PASP 36  . AKI (acute kidney injury)     a. 6/14: resolved after d/c ARB;  b. RA U/S 7/14: no RA stenosis  . Atrial flutter   . Stroke     His Past Surgical History Is Significant For: Past Surgical History  Procedure Laterality Date  . Partial colectomy      for diverticuli  . Orchiectomy    . Umbilical hernia repair    . Cervical fusion    . Coronary artery bypass graft  2005    LIMA graft to LAD,saphenous vein graft to diag.,circumflex, marginal,and to the RCA  . Cardiac catheterization    . Appendectomy    . Esophagogastroduodenoscopy  02/10/2012    Procedure: ESOPHAGOGASTRODUODENOSCOPY (EGD);  Surgeon: Cleotis Nipper, MD;  Location: [facility] ENDOSCOPY;  Service: Endoscopy;  Laterality: N/A;  . Foreign body removal  02/10/2012    Procedure: FOREIGN BODY REMOVAL;  Surgeon: Cleotis Nipper, MD;  Location: [place];  Service: Endoscopy;  Laterality: N/A;  . Back surgery    . Esophagogastroduodenoscopy  06/29/2012    Procedure: ESOPHAGOGASTRODUODENOSCOPY (EGD);  Surgeon: Winfield Cunas., MD;  Location: [facility] ENDOSCOPY;  Service: Endoscopy;  Laterality: N/A;  . Foreign body removal  06/29/2012    Procedure: FOREIGN BODY REMOVAL;  Surgeon: Winfield Cunas., MD;  Location: [facility] ENDOSCOPY;  Service: Endoscopy;  Laterality: N/A;  . Savory dilation  06/29/2012    Procedure: SAVORY DILATION;  Surgeon: Winfield Cunas., MD;  Location: [facility]OS[facility];  Service: Endoscopy;  Laterality: N/A;  . Tee without cardioversion N/A 11/17/2013    Procedure: TRANSESOPHAGEAL ECHOCARDIOGRAM (TEE);  Surgeon: Larey Dresser, MD;  Location[facility] ENDOSCOPY;  Service: Cardiovascular;  Laterality: N/A;    His Family History Is Significant For: Family History  Problem Relation Age of  Onset  . Emphysema Mother     was a smoker  . Asthma Mother   . Heart disease Father   . Prostate cancer Paternal Grandfather   . Pancreatic cancer Paternal Uncle     His Social History Is Significant For: History   Social History  . Marital Status: Married    Spouse Name: patrica    Number of Children: 2  . Years of Education: college   Occupational History  . 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     Social History Main Topics  . Smoking status: Never Smoker   . Smokeless tobacco: Never Used  . Alcohol Use: No     Comment: rare beer  .  Drug Use: No  . Sexual Activity: Not Currently   Other Topics Concern  . None   Social History Narrative   Patient lives at home with  wife      Patient drinks 2 cups of coffee a day.    His Allergies Are:  Allergies  Allergen Reactions  . Cephalexin     Unknown  . Doxycycline     Unknown  . Keflex [Cephalexin]   . Lac Bovis Other (See Comments)    lactose intolerant  . Pentazocine Lactate     He passed out- he had a seizure.  This occurred around 2000  :   His Current Medications Are:  Outpatient Encounter Prescriptions as of 12/22/2013  Medication Sig  . acetaminophen-codeine (TYLENOL #3) 300-30 MG per tablet TAKE 1 TABLET BY MOUTH EVERY 8 hours as needed for pain Ok to fill on 11/10/13.  Marland Kitchen aspirin 325 MG tablet Take 325 mg by mouth daily.  Marland Kitchen atorvastatin (LIPITOR) 80 MG tablet Take 1 tablet (80 mg total) by mouth daily.  . furosemide (LASIX) 20 MG tablet Take 1 or 2 daily as needed  . ibuprofen (ADVIL,MOTRIN) 200 MG tablet Take 400 mg by mouth every 6 (six) hours as needed for moderate pain.  Marland Kitchen loperamide (IMODIUM) 2 MG capsule Take 6 mg by mouth daily as needed for diarrhea or loose stools. Per bottle as needed  . methocarbamol (ROBAXIN) 750 MG tablet Take 750 mg by mouth 3 (three) times daily.  . metoCLOPramide (REGLAN) 5 MG tablet 1 tablet before each meal and at bedtime  . nebivolol (BYSTOLIC) 10 MG tablet Take  10 mg by mouth 2 (two) times daily.   . nitroGLYCERIN (NITROSTAT) 0.4 MG SL tablet Place 1 tablet (0.4 mg total) under the tongue every 5 (five) minutes as needed. For chest pain  . omeprazole (PRILOSEC) 20 MG capsule Take 20 mg by mouth 2 (two) times daily before a meal.  . sertraline (ZOLOFT) 25 MG tablet TAKE 1 TABLET BY MOUTH EVERY DAY, INCREASE TO TWO TABLETS EVERY DAY AFTER 1 WEEK  . sitaGLIPtin (JANUVIA) 100 MG tablet Take 100 mg by mouth daily.  Marland Kitchen testosterone cypionate (DEPOTESTOTERONE CYPIONATE) 200 MG/ML injection Inject 100 mg into the muscle every 14 (fourteen) days.   . traZODone (DESYREL) 150 MG tablet Take 150 mg by mouth at bedtime.  :  Review of Systems:  Out of a complete 14 point review of systems, all are reviewed and negative with the exception of these symptoms as listed below:  Review of Systems  Constitutional: Positive for fatigue and unexpected weight change.  HENT: Positive for hearing loss and trouble swallowing.   Respiratory:       Snoring  Cardiovascular: Positive for leg swelling.  Gastrointestinal: Positive for diarrhea.  Endocrine: Positive for heat intolerance.       Increase thirst   Genitourinary: Positive for urgency.       Impotence, Incontinence   Musculoskeletal: Positive for joint swelling.  Allergic/Immunologic: Positive for environmental allergies.       Runny nose   Neurological: Positive for seizures, weakness and headaches.       Memory loss, Difficulty Swallowing  Psychiatric/Behavioral: Positive for sleep disturbance.       Depression, Anxiety, To much sleep, Not Enough Sleep,Decreased Energy    Objective:  Neurologic Exam  Physical Exam Physical Examination:   Filed Vitals:   12/22/13 1124  BP: 113/67  Pulse: 72    General Examination: The patient is a  very pleasant 68 y.o. male in no acute distress. He appears well-developed and well-nourished and adequately groomed. He is situated in his wheelchair. He did not bring his  walker today.   HEENT: Normocephalic, atraumatic, pupils are equal, round and reactive to light and accommodation. Funduscopic exam is normal with sharp disc margins noted. Extraocular tracking is good without limitation to gaze excursion or nystagmus noted. Normal smooth pursuit is noted. Hearing is grossly intact. Tympanic membranes are clear bilaterally. Face is symmetric with normal facial animation and normal facial sensation. Speech is clear with no dysarthria noted. There is no hypophonia. There is no lip, neck/head, jaw or voice tremor. Neck is supple with full range of passive and active motion. There are no carotid bruits on auscultation. Oropharynx exam reveals: severe mouth dryness, adequate dental hygiene and marked airway crowding, due to quite narrow airway entry, and swollen and erythematous soft palate including uvula which is enlarged. Mallampati is class II. Tongue protrudes centrally and palate elevates symmetrically. Tonsils are absent. Neck size is enlarged.    Chest: Clear to auscultation without wheezing, rhonchi or crackles noted.  Heart: S1+S2+0, regular and normal without murmurs, rubs or gallops noted. He has a loop recorder in place.   Abdomen: Soft, non-tender and non-distended with normal bowel sounds appreciated on auscultation.  Extremities: There is trace pitting edema in the distal lower extremities bilaterally. Pedal pulses are intact.  Skin: Warm and dry without trophic changes noted. There are no varicose veins.  Musculoskeletal: exam reveals no obvious joint deformities, tenderness or joint swelling or erythema.   Neurologically:  Mental status: The patient is awake, alert and oriented in all 4 spheres. His immediate and remote memory, attention, language skills and fund of knowledge are fairly appropriate. There is no evidence of aphasia, agnosia, apraxia or anomia. Speech is clear with normal prosody and enunciation. Thought process is linear but he does have  some slowness in thinking and tends to turn to his wife for some of the answers. Mood is constricted and affect is blunted.  Cranial nerves II - XII are as described above under HEENT exam. In addition: shoulder shrug is normal with equal shoulder height noted. Motor exam: Normal bulk, strength and tone is noted on the R and he does have a 4+/5 degree of weakness in his left lower extremity with hip flexion and knee extension primarily. Reflexes are about 1+ throughout. Fine motor skills are mildly impaired on the left. He does not have any overt dysmetria or intention tremor on cerebellar testing. Sensory exam is intact to light touch, pinprick, vibration and temperature sense in the upper extremities but decrease to a mild degree to pinprick and vibration sense in the distal lower extremities bilaterally. Gait, station and balance: Since he did not bring his walker today I did not ask him to stand or walk for me today.  Assessment and Plan:  In summary, KYJUAN GAUSE is a very pleasant 68 y.o.-year old male with an underlying complex medical history of heart disease, hyperlipidemia, obesity, hypertension, history of seizure disorder, history of atrial flutter, depression, chronic back pain, hearing loss, diverticulitis, history of alcohol and prescription medicine abuse, diabetes, reflux disease, recurrent headaches, and stroke x 3, first in the 90s, including a recent embolic strokewith a history and physical exam concerning for obstructive sleep apnea (OSA). In fact he has a prior diagnosis of obstructive sleep apnea but did not tolerate CPAP in the past. Since this is more than 5 years ago,  I explained to him and his wife that there are newer machines out there that are more conducive to sleeping with and quieter. There are also newer masks out all the time that make using CPAP easier and more tolerable to most patients. He is willing to embark on another trial with CPAP if the need arises. He would be  willing to come back for another sleep study. I had a long chat with the patient and his wife about my findings and the diagnosis of OSA, its prognosis and treatment options. We talked about medical treatments, surgical interventions and non-pharmacological approaches. I explained in particular the risks and ramifications of untreated moderate to severe OSA, especially with respect to developing cardiovascular disease down the Road, including congestive heart failure, difficult to treat hypertension, cardiac arrhythmias, or stroke. Even type 2 diabetes has, in part, been linked to untreated OSA. Symptoms of untreated OSA include daytime sleepiness, memory problems, mood irritability and mood disorder such as depression and anxiety, lack of energy, as well as recurrent headaches, especially morning headaches. She does report he has had some mood issues. She also feels he has had some cognitive issues. He reports difficulty with sleep initiation and maintenance and nocturia as well. He is strongly discouraged from using more than what he is prescribed as far as the trazodone or any other medication is concerned and also advised not to use anybody else's medication. He is also advised to reduce his caffeine intake. We talked about sleep hygiene quite a bit today as well. We talked about trying to maintain a healthy lifestyle in general, as well as the importance of weight control. I encouraged the patient to eat healthy, exercise daily and keep well hydrated, to keep a scheduled bedtime and wake time routine, to not skip any meals and eat healthy snacks in between meals.  I recommended the following at this time: sleep study with potential positive airway pressure titration.  I explained the sleep test procedure to the patient and also outlined possible surgical and non-surgical treatment options of OSA, including the use of a custom-made dental device (which would require a referral to a specialist dentist or oral  surgeon), upper airway surgical options, such as pillar implants, radiofrequency surgery, tongue base surgery, and UPPP (which would involve a referral to an ENT surgeon). Rarely, jaw surgery such as mandibular advancement may be considered.  I also explained the CPAP treatment option to the patient, who indicated that he would be willing to try CPAP if the need arises. I explained the importance of being compliant with PAP treatment, not only for insurance purposes but primarily to improve His symptoms, and for the patient's long term health benefit, including to reduce His cardiovascular risks. I think it is imperative that he be checked for sleep apnea and treated given his cardiovascular risk profile. I answered all their questions today and the patient and his wife were in agreement. I would like to see him back after the sleep study is completed and encouraged them to call with any interim questions, concerns, problems or updates.   Thank you very much for allowing me to participate in the care of this nice patient. If I can be of any further assistance to you please do not hesitate to call me.  Sincerely,   Star Age, MD, PhD

## 2013-12-23 DIAGNOSIS — N529 Male erectile dysfunction, unspecified: Secondary | ICD-10-CM | POA: Diagnosis not present

## 2013-12-23 DIAGNOSIS — E291 Testicular hypofunction: Secondary | ICD-10-CM | POA: Diagnosis not present

## 2013-12-28 ENCOUNTER — Encounter (INDEPENDENT_AMBULATORY_CARE_PROVIDER_SITE_OTHER): Payer: Self-pay

## 2013-12-28 DIAGNOSIS — Z0289 Encounter for other administrative examinations: Secondary | ICD-10-CM

## 2013-12-29 ENCOUNTER — Encounter: Payer: Self-pay | Admitting: Cardiology

## 2013-12-29 NOTE — Progress Notes (Signed)
To whom it may concern:  I have reviewed [name] prior records concerning history of atrial flutter. He had a single episode of atrial flutter in 2008 in the setting of acute sepsis and shock. He converted spontaneously in the ED. He has had no further episodes of atrial fibrillation or flutter to date. Based on a single episode of atrial flutter related to stress without recurrence it is felt that he does not require long term anticoagulation at this time  Jacob Deveney [place] MD, [facility]  12/29/2013 11:45 AM

## 2014-01-03 ENCOUNTER — Encounter (INDEPENDENT_AMBULATORY_CARE_PROVIDER_SITE_OTHER): Payer: Self-pay

## 2014-01-03 DIAGNOSIS — Z0289 Encounter for other administrative examinations: Secondary | ICD-10-CM

## 2014-01-04 ENCOUNTER — Other Ambulatory Visit: Payer: Self-pay

## 2014-01-07 ENCOUNTER — Encounter: Payer: Self-pay | Admitting: Emergency Medicine

## 2014-01-10 ENCOUNTER — Ambulatory Visit (INDEPENDENT_AMBULATORY_CARE_PROVIDER_SITE_OTHER): Payer: Medicare Other | Admitting: *Deleted

## 2014-01-10 DIAGNOSIS — I6350 Cerebral infarction due to unspecified occlusion or stenosis of unspecified cerebral artery: Secondary | ICD-10-CM | POA: Diagnosis not present

## 2014-01-10 DIAGNOSIS — I639 Cerebral infarction, unspecified: Secondary | ICD-10-CM

## 2014-01-10 LAB — MDC_IDC_ENUM_SESS_TYPE_REMOTE

## 2014-01-12 ENCOUNTER — Telehealth: Payer: Self-pay | Admitting: Neurology

## 2014-01-12 NOTE — Telephone Encounter (Signed)
Patient's wife returning call to [facility], please call back and advise.

## 2014-01-13 NOTE — Telephone Encounter (Signed)
Spoke with patient's wife and she was trying to reach Amy (research),has contacted her already

## 2014-01-18 ENCOUNTER — Encounter: Payer: Medicare Other | Attending: Emergency Medicine | Admitting: *Deleted

## 2014-01-18 ENCOUNTER — Encounter: Payer: Self-pay | Admitting: *Deleted

## 2014-01-18 VITALS — Ht 64.0 in | Wt 211.6 lb

## 2014-01-18 DIAGNOSIS — Z713 Dietary counseling and surveillance: Secondary | ICD-10-CM | POA: Diagnosis not present

## 2014-01-18 DIAGNOSIS — E119 Type 2 diabetes mellitus without complications: Secondary | ICD-10-CM

## 2014-01-18 NOTE — Patient Instructions (Signed)
Plan:  Consider 1/4 of your plate for your meat serving, 1/4 of your plate for your starch food and 1/2 of your plate for vegetables and salad  Include protein in moderation with your meals and snacks Consider  increasing your activity level for 5-10 minutes daily as tolerated Consider checking BG at alternate times per day as directed by MD

## 2014-01-18 NOTE — Progress Notes (Signed)
Appt start time: 1530 end time:  1700.  Assessment:  Patient was seen on  01/18/2014 for individual diabetes education. He is here with his wife who would like more information about what he should eat. He is seen at the [place] also. Retired from [employer]. SMBG every AM with reported range of 120-140 mg/dl. No problems with low BG stated.   Current HbA1c: 6.8%  Preferred Learning Style:   No preference indicated   Learning Readiness:   Ready  Change in progress  MEDICATIONS: see list, diabetes medication is Januvia  DIETARY INTAKE:  24-hr recall:  B ( AM):regular oatmeal, scrambled eggs, coffee with creamer and Equal  Snk ( AM): not unless pkg of crackers  L ( PM): sandwich with chips, Gatorade or diet soda Snk ( PM): pkg of crackers if anything D ( PM): or 6 - 12" or [place] salad, other take out usually Snk ( PM): ice cream or carton of yogurt Beverages: coffee, Gatorade, Fresca  Usual physical activity: yard work occasionally  Estimated energy needs: 1600 calories 180 g carbohydrates 120 g protein 44 g fat  Progress Towards Goal(s):  In progress.   Nutritional Diagnosis:  NB-1.1 Food and nutrition-related knowledge deficit As related to diabetes.  As evidenced by A1c of 6.8%.    Intervention:  Nutrition counseling provided.  Discussed diabetes disease process and treatment options.  Discussed physiology of diabetes and role of obesity on insulin resistance.  Encouraged moderate weight reduction to improve glucose levels.  Discussed role of medications and diet in glucose control  Provided education on macronutrients on glucose levels.  Provided education on basic carb counting using Plate Method  Discussed effects of physical activity on glucose levels and long-term glucose control.  Recommended increase in his physical activity/week.  Reviewed patient medications.  Discussed role of medication on blood glucose and possible side effects  Discussed blood  glucose monitoring and interpretation.  Discussed recommended target ranges and individual ranges.    Described short-term complications: hyper- and hypo-glycemia.  Discussed causes,symptoms, and treatment options.  Discussed prevention, detection, and treatment of long-term complications.  Discussed the role of prolonged elevated glucose levels on body systems.  Discussed role of stress on blood glucose levels and discussed strategies to manage psychosocial issues.  Discussed recommendations for long-term diabetes self-care.  Provided checklist for medical, dental, and emotional self-care.  Plan:  Consider 1/4 of your plate for your meat serving, 1/4 of your plate for your starch food and 1/2 of your plate for vegetables and salad  Include protein in moderation with your meals and snacks Consider  increasing your activity level for 5-10 minutes daily as tolerated Consider checking BG at alternate times per day as directed by MD   Teaching Method Utilized: Visual, Auditory and Hands on  Handouts given during visit include: Living Well with Diabetes Carb Counting and Food Label handouts Plate Method Handout  Barriers to learning/adherence to lifestyle change: none  Diabetes self-care support plan:   [facility] support group available  Demonstrated degree of understanding via:  Teach Back   Monitoring/Evaluation:  Dietary intake, exercise, SMBG, and body weight in 6 weeks.

## 2014-01-22 ENCOUNTER — Ambulatory Visit (INDEPENDENT_AMBULATORY_CARE_PROVIDER_SITE_OTHER): Payer: Medicare Other | Admitting: Emergency Medicine

## 2014-01-22 VITALS — BP 128/72 | HR 71 | Temp 97.7°F | Resp 20 | Ht 65.0 in | Wt 211.4 lb

## 2014-01-22 DIAGNOSIS — E119 Type 2 diabetes mellitus without complications: Secondary | ICD-10-CM

## 2014-01-22 LAB — GLUCOSE, POCT (MANUAL RESULT ENTRY): POC Glucose: 102 mg/dl — AB (ref 70–99)

## 2014-01-22 NOTE — Progress Notes (Signed)
Subjective:    Patient ID: [name], male    DOB: May 30, 1946, 68 y.o.   MRN: [id number]  HPI Chief Complaint  Patient presents with  . Follow-up    recheck per pt with Dr. [name] for his DM and stroke that he has had in the past. pt stated that he is still very slow since his stroke   This chart was scribed for Jacob Queen, MD by Thea Alken, ED Scribe. This patient was seen in room 12 and the patient's care was started at 12:23 PM.  HPI Comments: SAHAN PEN is a 68 y.o. male who presents to the [facility] here for a follow up regarding Dm and stroke. Pt reports he has been making progress but has been moving slow. He reports his therapy has stopped due to retrieving the maximum benefit. Pt reports his sugars have been 120-140. Pt did not check his sugars this morning. He denies gait being unsteady. Pt reports he has been walking with the walker. Pt is no longer taking testosterone  Pt is having trouble breathing at night and has been using Flonase with relief.  Per wife she is concerned that pt has been abusing her prescription drugs, hydrocodone. Pt reports addiction in the past.       Past Medical History  Diagnosis Date  . Coronary artery disease     a. Low level exercise Lex MV 3/14: low risk, EF 62%, inf defect 2/2 diaph attenuation vs artifiact, small area of scar possible, no ischemia  . Hyperlipidemia   . Hypertension   . History of seizure disorder   . History of atrial flutter   . Depression   . Back pain     persistent  . Hearing loss   . Diverticulitis   . H/O alcohol abuse   . Diabetes mellitus     controled by diet  . GERD (gastroesophageal reflux disease)   . Seizures   . Headache(784.0)   . Arthritis     of spine  . Sleep apnea     does not use CPAP  . Hx of echocardiogram     Echo 7/14:  Mod LVH, EF 55-60%, Gr 1 DD, mild MR, PASP 36  . AKI (acute kidney injury)     a. 6/14: resolved after d/c ARB;  b. RA U/S 7/14: no RA  stenosis  . Atrial flutter   . Stroke    Allergies  Allergen Reactions  . Cephalexin     Unknown  . Doxycycline     Unknown  . Keflex [Cephalexin]   . Lac Bovis Other (See Comments)    lactose intolerant  . Pentazocine Lactate     He passed out- he had a seizure.  This occurred around 2000   Prior to Admission medications   Medication Sig Start Date End Date Taking? Authorizing Provider  acetaminophen-codeine (TYLENOL #3) 300-30 MG per tablet TAKE 1 TABLET BY MOUTH EVERY 8 hours as needed for pain Ok to fill on 11/10/13. 11/04/13  Yes Gay Filler Copland, MD  aspirin 325 MG tablet Take 325 mg by mouth daily.   Yes Historical Provider, MD  atorvastatin (LIPITOR) 80 MG tablet Take 1 tablet (80 mg total) by mouth daily. 10/29/13  Yes Gay Filler Copland, MD  furosemide (LASIX) 20 MG tablet Take 1 or 2 daily as needed 11/04/13  Yes Jessica C Copland, MD  ibuprofen (ADVIL,MOTRIN) 200 MG tablet Take 400 mg by mouth every 6 (  six) hours as needed for moderate pain.   Yes Historical Provider, MD  loperamide (IMODIUM) 2 MG capsule Take 6 mg by mouth daily as needed for diarrhea or loose stools. Per bottle as needed   Yes Historical Provider, MD  methocarbamol (ROBAXIN) 750 MG tablet Take 750 mg by mouth 3 (three) times daily.   Yes Historical Provider, MD  metoCLOPramide (REGLAN) 5 MG tablet 1 tablet before each meal and at bedtime   Yes Historical Provider, MD  nebivolol (BYSTOLIC) 10 MG tablet Take 10 mg by mouth 2 (two) times daily.    Yes Historical Provider, MD  nitroGLYCERIN (NITROSTAT) 0.4 MG SL tablet Place 1 tablet (0.4 mg total) under the tongue every 5 (five) minutes as needed. For chest pain 08/06/12  Yes Peter M Martinique, MD  omeprazole (PRILOSEC) 20 MG capsule Take 20 mg by mouth 2 (two) times daily before a meal.   Yes Historical Provider, MD  sitaGLIPtin (JANUVIA) 100 MG tablet Take 100 mg by mouth daily.   Yes Historical Provider, MD  traZODone (DESYREL) 150 MG tablet Take 150 mg by mouth  at bedtime.   Yes Historical Provider, MD   Review of Systems  Musculoskeletal: Negative for gait problem.      Objective:   Physical Exam CONSTITUTIONAL: Well developed/well nourished HEAD: Normocephalic/atraumatic EYES: EOMI/PERRL ENMT: Mucous membranes moist, cerumen impaction of right ear which was removed NECK: supple no meningeal signs SPINE:entire spine nontender CV: S1/S2 noted, no murmurs/rubs/gallops noted LUNGS: Lungs are clear to auscultation bilaterally, no apparent distress ABDOMEN: soft, nontender, no rebound or guarding GU:no cva tenderness NEURO: Pt is awake/alert, moves all extremitiesx4, mild difficulty of words   EXTREMITIES: pulses normal, full ROM, gait evaluation shows that pt needs walker, pt is able to walk without it but is some what unstable. SKIN: warm, color normal PSYCH: no abnormalities of mood noted Results for orders placed in visit on 01/22/14  GLUCOSE, POCT (MANUAL RESULT ENTRY)      Result Value Ref Range   POC Glucose 102 (*) 70 - 99 mg/dl      Assessment & Plan:   Patient is doing well. We'll recheck in 2 months. He is to work on his gait and continue to work on his speech. No change in blood pressure or diabetes medicines. He is to have his followup sleep study done.scribe I personally performed the services described in this documentation, which was scribed in my presence. The recorded information has been reviewed and is accurate.

## 2014-01-24 ENCOUNTER — Ambulatory Visit (INDEPENDENT_AMBULATORY_CARE_PROVIDER_SITE_OTHER): Payer: Medicare Other | Admitting: Neurology

## 2014-01-24 DIAGNOSIS — E669 Obesity, unspecified: Secondary | ICD-10-CM

## 2014-01-24 DIAGNOSIS — G4733 Obstructive sleep apnea (adult) (pediatric): Secondary | ICD-10-CM

## 2014-01-24 DIAGNOSIS — G4700 Insomnia, unspecified: Secondary | ICD-10-CM

## 2014-01-24 DIAGNOSIS — I639 Cerebral infarction, unspecified: Secondary | ICD-10-CM

## 2014-01-24 DIAGNOSIS — G4761 Periodic limb movement disorder: Secondary | ICD-10-CM

## 2014-01-24 DIAGNOSIS — K219 Gastro-esophageal reflux disease without esophagitis: Secondary | ICD-10-CM

## 2014-01-25 NOTE — Sleep Study (Signed)
See media tab for full report  

## 2014-02-01 ENCOUNTER — Encounter: Payer: Self-pay | Admitting: Internal Medicine

## 2014-02-04 ENCOUNTER — Telehealth: Payer: Self-pay | Admitting: Neurology

## 2014-02-04 DIAGNOSIS — G4739 Other sleep apnea: Secondary | ICD-10-CM

## 2014-02-04 DIAGNOSIS — G4731 Primary central sleep apnea: Secondary | ICD-10-CM

## 2014-02-04 DIAGNOSIS — I519 Heart disease, unspecified: Secondary | ICD-10-CM

## 2014-02-04 DIAGNOSIS — I639 Cerebral infarction, unspecified: Secondary | ICD-10-CM

## 2014-02-04 NOTE — Telephone Encounter (Signed)
Please call and notify patient that the recent sleep study confirmed the diagnosis of OSA. He did well with BiPAP during the study with significant improvement of the respiratory events. Therefore, I would like start the patient on BiPAP at home. I placed the order in the chart.   Arrange for BiPAP set up at home through a DME company of patient's choice and fax/route report to PCP and referring MD (if other than PCP).   The patient will also need a follow up appointment with me in 6-8 weeks post set up that has to be scheduled; help the patient schedule this (in a follow-up slot).   Please re-enforce the importance of compliance with treatment and the need for [place] to monitor compliance data.   Once you have spoken to the patient and scheduled the return appointment, you may close this encounter, thanks,   Star Age, MD, PhD [facility] ([place])

## 2014-02-05 ENCOUNTER — Other Ambulatory Visit: Payer: Self-pay | Admitting: Internal Medicine

## 2014-02-07 ENCOUNTER — Encounter: Payer: Self-pay | Admitting: *Deleted

## 2014-02-07 ENCOUNTER — Telehealth: Payer: Self-pay | Admitting: *Deleted

## 2014-02-07 NOTE — Telephone Encounter (Addendum)
Spoke with wife and she said that the patient was walking up hill and knees started shaking, almost fell . Does patient need physical therapy or a walker with a seat?

## 2014-02-07 NOTE — Telephone Encounter (Signed)
No if this is a one time event. Wait and watch for now but call if recurs. Keep scheduled f/u appointment with Jeani Hawking, NP

## 2014-02-07 NOTE — Telephone Encounter (Signed)
I called and spoke with the patient's spouse about his recent sleep study results. I informed the patient that the study confirmed the diagnosis of obstructive sleep apnea and that he did well on BiPAP during the night of his study with significant improvement of his respiratory events. Dr. Rexene Alberts recommend starting BiPAP therapy at home. Patient's spouse stated that they would like for the order to be sent to [place]. I will fax a copy of the report to Dr. Leonie Man and Dr. [name] offices and mail a copy of the report to the patient along with a follow up instruction letter.

## 2014-02-08 ENCOUNTER — Telehealth: Payer: Self-pay | Admitting: Neurology

## 2014-02-08 NOTE — Telephone Encounter (Signed)
Patient's spouse called very upset stating someone called and left a message stating her husband would need to have a new referral due to Dr. Leonie Man never mentioned in his notes about her husband's knee issue. Patient's spouse stated that when her husband was in the hospital on 11-14-2013 for stroke it was mention about his knees buckling and that she has discuss this with Dr. Leonie Man. Patient's husband received PT/OT home for his knee buckling(gait issue) per hospital notes. Patient's spouse stated that her husband's knee bucking is getting worse since last week and is wanting PT or sooner appointment

## 2014-02-09 NOTE — Telephone Encounter (Addendum)
Spoke with patient's wife and she is going to keep her husband's already scheduled appt in July with Ms Lam, and requesting to be placed on wait list.

## 2014-02-11 ENCOUNTER — Telehealth: Payer: Self-pay | Admitting: Cardiology

## 2014-02-11 NOTE — Telephone Encounter (Signed)
Spoke with pt and informed pt to send manual transmission.

## 2014-02-14 ENCOUNTER — Telehealth: Payer: Self-pay

## 2014-02-14 DIAGNOSIS — E138 Other specified diabetes mellitus with unspecified complications: Secondary | ICD-10-CM

## 2014-02-14 NOTE — Telephone Encounter (Signed)
Dr. [name] Patient was seen by you 01/22/14 for diabetes and needs test strips.  He has an embrace meter.  Please send to [facility]. Thank you!

## 2014-02-14 NOTE — Telephone Encounter (Signed)
Patient is calling to ask Dr. [name] to refill his glucose strips or he said its to check his blood sugar - he is out. I told him id put in a message and to have his pharmacy to contact us to refill.   Best: [phone]

## 2014-02-15 MED ORDER — GLUCOSE BLOOD VI STRP: ORAL_STRIP | Status: DC

## 2014-02-15 NOTE — Telephone Encounter (Signed)
Certainly okay to fill his strips

## 2014-02-15 NOTE — Telephone Encounter (Signed)
Sent strip refill to pharm. Spoke to pt, he is aware refill sent to pharm.

## 2014-02-15 NOTE — Telephone Encounter (Signed)
Please send in test strips

## 2014-02-16 NOTE — Telephone Encounter (Signed)
Duplicate phone message

## 2014-02-17 ENCOUNTER — Other Ambulatory Visit: Payer: Self-pay | Admitting: *Deleted

## 2014-02-17 MED ORDER — ONETOUCH DELICA LANCETS FINE MISC: Status: DC

## 2014-02-17 MED ORDER — ONETOUCH ULTRA 2 W/DEVICE KIT: PACK | Status: DC

## 2014-02-18 ENCOUNTER — Ambulatory Visit (INDEPENDENT_AMBULATORY_CARE_PROVIDER_SITE_OTHER): Payer: Medicare Other | Admitting: *Deleted

## 2014-02-18 DIAGNOSIS — I6350 Cerebral infarction due to unspecified occlusion or stenosis of unspecified cerebral artery: Secondary | ICD-10-CM | POA: Diagnosis not present

## 2014-02-18 DIAGNOSIS — I639 Cerebral infarction, unspecified: Secondary | ICD-10-CM

## 2014-02-18 NOTE — Progress Notes (Signed)
Loop recorder 

## 2014-02-22 NOTE — Progress Notes (Signed)
Loop recorder 

## 2014-02-24 ENCOUNTER — Ambulatory Visit: Payer: Medicare Other | Admitting: Neurology

## 2014-02-25 ENCOUNTER — Telehealth: Payer: Self-pay

## 2014-02-25 NOTE — Telephone Encounter (Signed)
[facility] Medicare dept faxed form. Completed and faxed back.

## 2014-03-01 ENCOUNTER — Telehealth: Payer: Self-pay

## 2014-03-01 NOTE — Telephone Encounter (Signed)
Completed [facility] Medicare DM supplies form and put in Dr Perfecto Kingdom box for signature.

## 2014-03-03 ENCOUNTER — Encounter: Payer: Medicare Other | Attending: Emergency Medicine | Admitting: *Deleted

## 2014-03-03 VITALS — Ht 64.0 in | Wt 210.3 lb

## 2014-03-03 DIAGNOSIS — Z713 Dietary counseling and surveillance: Secondary | ICD-10-CM | POA: Insufficient documentation

## 2014-03-03 DIAGNOSIS — E119 Type 2 diabetes mellitus without complications: Secondary | ICD-10-CM | POA: Insufficient documentation

## 2014-03-03 DIAGNOSIS — H02839 Dermatochalasis of unspecified eye, unspecified eyelid: Secondary | ICD-10-CM | POA: Diagnosis not present

## 2014-03-03 DIAGNOSIS — H2510 Age-related nuclear cataract, unspecified eye: Secondary | ICD-10-CM | POA: Diagnosis not present

## 2014-03-03 DIAGNOSIS — Z961 Presence of intraocular lens: Secondary | ICD-10-CM | POA: Diagnosis not present

## 2014-03-03 DIAGNOSIS — H43819 Vitreous degeneration, unspecified eye: Secondary | ICD-10-CM | POA: Diagnosis not present

## 2014-03-03 NOTE — Patient Instructions (Signed)
Plan:  Continue eating 1 plate of food instead of 2 plates. Congratulations on using smaller plates! Include protein in moderation with your meals and snacks Consider  increasing your activity level for 5-10 minutes daily as tolerated. Check out the Arm Chair Exercises and pool exercises Continue checking BG at alternate times per day as directed by MD

## 2014-03-03 NOTE — Progress Notes (Signed)
Appt start time: 1130 end time:  1200.  Assessment:  Patient was seen on  03/03/2014 for individual diabetes education follow up visit. He states he is checking BG 1-2 times a day with reported range of 106-130 mg/dl pre and post meals even with 8 oz O.J. occasionally. He is eating more fish now, more salads and is eating smaller portions including 1/2 size salad at [facility]. They are using smaller plates for their meals too.  Current HbA1c: 6.8%  Preferred Learning Style:   No preference indicated   Learning Readiness:   Ready  Change in progress  MEDICATIONS: see list, diabetes medication is Januvia  DIETARY INTAKE:  24-hr recall:  B ( AM):regular oatmeal, scrambled eggs, coffee with creamer and Equal  Snk ( AM): not unless pkg of crackers  L ( PM): sandwich with chips, Gatorade or diet soda Snk ( PM): pkg of crackers if anything D ( PM): or 6 - 12" or [place] salad, other take out usually Snk ( PM): ice cream or carton of yogurt Beverages: coffee, Gatorade, Fresca  Usual physical activity: yard work occasionally  Estimated energy needs: 1600 calories 180 g carbohydrates 120 g protein 44 g fat  Progress Towards Goal(s):  In progress.   Nutritional Diagnosis:  NB-1.1 Food and nutrition-related knowledge deficit As related to diabetes.  As evidenced by A1c of 6.8%.    Intervention:  Nutrition counseling provided.  Commended him on his improved eating habits.   Discussed ways to increase his activity level either with Arm Chair Exercises or possibly the pool  Commended him on his SMBG as well as reviewing target BG ranges for him.  Plan:  Continue eating 1 plate of food instead of 2 plates. Congratulations on using smaller plates! Include protein in moderation with your meals and snacks Consider  increasing your activity level for 5-10 minutes daily as tolerated. Check out the Arm Chair Exercises and pool exercises Continue checking BG at alternate times per day as  directed by MD     Teaching Method Utilized: Visual, Auditory and Hands on  Handouts given during visit include: Arm Chair Exercise Handout  Barriers to learning/adherence to lifestyle change: none  Diabetes self-care support plan:   [facility] support group available  Demonstrated degree of understanding via:  Teach Back   Monitoring/Evaluation:  Dietary intake, exercise, SMBG, and body weight PRN.

## 2014-03-09 ENCOUNTER — Encounter: Payer: Self-pay | Admitting: Nurse Practitioner

## 2014-03-09 ENCOUNTER — Ambulatory Visit (INDEPENDENT_AMBULATORY_CARE_PROVIDER_SITE_OTHER): Payer: Medicare Other | Admitting: Nurse Practitioner

## 2014-03-09 VITALS — BP 117/73 | HR 69 | Ht 64.0 in | Wt 215.0 lb

## 2014-03-09 DIAGNOSIS — G4733 Obstructive sleep apnea (adult) (pediatric): Secondary | ICD-10-CM | POA: Diagnosis not present

## 2014-03-09 DIAGNOSIS — G4731 Primary central sleep apnea: Secondary | ICD-10-CM

## 2014-03-09 DIAGNOSIS — G4739 Other sleep apnea: Secondary | ICD-10-CM

## 2014-03-09 DIAGNOSIS — R262 Difficulty in walking, not elsewhere classified: Secondary | ICD-10-CM | POA: Diagnosis not present

## 2014-03-09 DIAGNOSIS — Z9889 Other specified postprocedural states: Secondary | ICD-10-CM

## 2014-03-09 DIAGNOSIS — I2510 Atherosclerotic heart disease of native coronary artery without angina pectoris: Secondary | ICD-10-CM | POA: Diagnosis not present

## 2014-03-09 DIAGNOSIS — R29898 Other symptoms and signs involving the musculoskeletal system: Secondary | ICD-10-CM | POA: Diagnosis not present

## 2014-03-09 DIAGNOSIS — Z981 Arthrodesis status: Secondary | ICD-10-CM

## 2014-03-09 DIAGNOSIS — I6340 Cerebral infarction due to embolism of unspecified cerebral artery: Secondary | ICD-10-CM | POA: Diagnosis not present

## 2014-03-09 DIAGNOSIS — I639 Cerebral infarction, unspecified: Secondary | ICD-10-CM

## 2014-03-09 DIAGNOSIS — G4737 Central sleep apnea in conditions classified elsewhere: Secondary | ICD-10-CM

## 2014-03-09 NOTE — Progress Notes (Addendum)
PATIENT: [name] DOB: Nov 15, 1945  REASON FOR VISIT: routine follow up for stroke, increased gait difficulty HISTORY FROM: patient  HISTORY OF PRESENT ILLNESS: Update 03/09/14 (LL):  Since last visit, patient has had increased difficulty walking.  She had called on 02/08/14, trying to get him in sooner. He states he will be walking and his left leg will start to shake, then he immediately looses strength in the leg and he falls.  He states this happens not every day, but more days than not.  The leg does not shake when he is sitting or lying down. The leg does hurt when these episodes occur. He went through PT, but his wife states that it did not help.  Wife states that when these episodes occur, he freezes where he is at, his eyes get big and then he falls.  He was enrolled in the RESPECT-ESUS study and has been taking the study medication without problem.  His loop recorder has not shown any arrhythmias to date. His cpap therapy is going very well, he loves it and his report shows great compliance.  He has ordered a rolling walker through the [place].  49 year Caucasian male seen for.firstoffice visit following hospital admission for stroke. He was admitted on 11/18/13 with sudden onset of left leg weakness and tripping and falling. Head CT scan showed a subacute right frontal infarct an MRI subsequently confirmed that. MRA showed no large vessel stenosis and carotid dopplers were unremarkable.There is a questionable history of atrial arrhythmia but documented on his problem list But after extensive review of his prior medical records and discussion with family no definite atrial fibrillation was documented patient has never been on anticoagulation . Hence he got a complete workup including transthoracic echo which was normal and TEE which showed no obvious cardiac source of embolism or PFO. He had an implantable loop recorder placed to look for paroxysmal atrial fibrillation. He was started on aspirin  for stroke prevention. Vascular cycle and if) obesity, sleep apnea, attention, hyperlipidemia and diabetes. Patient congenital morning will regular sinus rhythm. Patient states his general condition is not having recurrent stroke or chest symptoms clearly skull has been well excreted recorder has so far not recorded any atrial fibrillation. Patient does have sleep apnea but states that he has not tolerated CPAP in the past. He admits to restless sleep, snoring feeling excessively tired during the day. His wife in fact is scheduled sleep in the same room. Patient's wife is concerned about his ability to drive and she feels his concentration is not good and he gets confused at times. Patient has finished home physical and occupational therapy.   ROS:  14 system review of systems is positive for fatigue, leg swelling, hearing loss, trouble swallowing, cough, snoring, incontinence, impotance, weakness, aching muscles, memory loss, headache, difficulty swallowing, anxiety, depression, not enough sleep, decreased energy, and disinterest in activities, insomnia, sleepiness and snoring.  ALLERGIES: Allergies  Allergen Reactions  . Cephalexin     Unknown  . Doxycycline     Unknown  . Keflex [Cephalexin]   . Lac Bovis Other (See Comments)    lactose intolerant  . Pentazocine Lactate     He passed out- he had a seizure.  This occurred around [place]: Outpatient Prescriptions Prior to Visit  Medication Sig Dispense Refill  . acetaminophen-codeine (TYLENOL #3) 300-30 MG per tablet TAKE 1 TABLET BY MOUTH EVERY 8 hours as needed for pain Ok to fill  on 11/10/13.  40 tablet  0  . aspirin 325 MG tablet Take 325 mg by mouth daily.      Marland Kitchen atorvastatin (LIPITOR) 80 MG tablet Take 1 tablet (80 mg total) by mouth daily.  30 tablet  11  . Blood Glucose Monitoring Suppl (ONE TOUCH ULTRA 2) W/DEVICE KIT Use as directed. Dx code: 250.00  1 each  0  . furosemide (LASIX) 20 MG tablet Take 1 or 2 daily  as needed  60 tablet  5  . glucose blood test strip Use as instructed  100 each  3  . loperamide (IMODIUM) 2 MG capsule Take 6 mg by mouth daily as needed for diarrhea or loose stools. Per bottle as needed      . methocarbamol (ROBAXIN) 750 MG tablet Take 750 mg by mouth 3 (three) times daily.      . metoCLOPramide (REGLAN) 5 MG tablet 1 tablet before each meal and at bedtime      . nebivolol (BYSTOLIC) 10 MG tablet Take 1 tablet (10 mg total) by mouth 2 (two) times daily.  60 tablet  5  . omeprazole (PRILOSEC) 20 MG capsule Take 20 mg by mouth 2 (two) times daily before a meal.      . ONETOUCH DELICA LANCETS FINE MISC Use as directed. Dx code 250.00  100 each  3  . sitaGLIPtin (JANUVIA) 100 MG tablet Take 100 mg by mouth daily.      . traZODone (DESYREL) 150 MG tablet Take 150 mg by mouth at bedtime.      . nitroGLYCERIN (NITROSTAT) 0.4 MG SL tablet Place 1 tablet (0.4 mg total) under the tongue every 5 (five) minutes as needed. For chest pain  25 tablet  6  . ibuprofen (ADVIL,MOTRIN) 200 MG tablet Take 400 mg by mouth every 6 (six) hours as needed for moderate pain.       No facility-administered medications prior to visit.    PHYSICAL EXAM Filed Vitals:   03/09/14 1438  BP: 117/73  Pulse: 69  Height: 5' 4"  (1.626 m)  Weight: 215 lb (97.523 kg)   Body mass index is 36.89 kg/(m^2).  Physical Exam  General: obese middle aged Caucasian male seated, in no evident distress  Head: head normocephalic and atraumatic. Orohparynx benign  Neck: supple with no carotid or supraclavicular bruits  Cardiovascular: regular rate and rhythm, no murmurs  Musculoskeletal: no deformity  Skin: no rash/petichiae  Vascular: Normal pulses all extremities   Neurologic Exam  Mental Status: Awake and fully alert. Oriented to place and time. Recent and remote memory intact. Attention span, concentration and fund of knowledge appropriate. Mood and affect appropriate. (Last visit: MMSE 28/30 with AF 5. Clock  Drawing 4/4. Geriatric Depression scale 7) Cranial Nerves: Fundoscopic exam not done. Pupils equal, briskly reactive to light. Extraocular movements full without nystagmus. Visual fields full to confrontation. Hearing intact. Facial sensation intact. Face, tongue, palate moves normally and symmetrically.  Motor: Normal bulk and tone. Normal strength in all tested extremity muscles.  Sensory: intact to touch and pinprick and vibratory sensation.  Coordination: Rapid alternating movements normal in all extremities. Finger-to-nose and heel-to-shin performed accurately bilaterally.  Gait and Station: Arises from chair with mild difficulty. Stance is normal. Gait demonstrates normal stride length with impaired balance. Unable to heel, toe and tandem walk without difficulty.  No episodes of leg tremor during this visit. Reflexes: 1+ and symmetric.  Modified Rankin 2-3   ASSESSMENT: 35 year patient with right frontal infarct  of embolic etiology in March 2015 without definite identified source of embolism. Vascular risk factors half hypertension, diabetes , sleep apnea and hyperlipidemia. His sleep apnea is being treated now with cpap by Dr. Rexene Alberts with excellent compliance.  He is having episodes of sudden left leg shaking followed by weakness which is causing him to fall frequently.  At this point I am unsure if this is possibly related to a radiculopathy, (as he has had 2 lumbar spine surgeries in the past) or if it represents orthostatic tremor. We will check an MRI of the lumbar spine, and he may need a NCV/EMG in the future.  PLAN:  I had a long discussion with the patient and his wife regarding his new symptoms and falls.  He is advised to use a walker at all times, he may need to use a wheelchair due to fall risk.    Continue aspirin for stroke prevention. He will continue to see Dr. Rexene Alberts  for his sleep apnea management. Maintain strict control of hypertension with blood pressure goal below 130/90,  lipids with an LDL cholesterol goal below 70 mg percent. I advised him to diet, exercise regularly and maintain ideal body weight.  Return for followup in 6 months, sooner as needed.  Orders Placed This Encounter  Procedures  . MR Lumbar Spine Wo Contrast   MRI shows:  1. Progressive severe bilateral facet arthritis at L4-5. However, the spondylolisthesis visible on the prior study is not present on the current exam.  2. Postsurgical changes at L5-S1.  3. Disc bulges at L2-3 and L3-4 without neural impingement.  Plan to order NCV/EMG next.  Rudi Rummage LAM, MSN, FNP-BC, A/GNP-C 03/09/2014, 3:48 PM [facility] [address] [place], [place] [address] [phone]  Note: This document was prepared with digital dictation and possible smart phrase technology. Any transcriptional errors that result from this process are unintentional.

## 2014-03-09 NOTE — Patient Instructions (Addendum)
Continue study drug (either aspirin or Pradaxa) for stroke prevention. He will continue to see Dr. Rexene Alberts  for his sleep apnea management. Maintain strict control of hypertension with blood pressure goal below 130/90, lipids with an LDL cholesterol goal below 70 mg percent. I advised him to diet, exercise regularly and maintain ideal body weight.    Your cpap compliance is great.  Keep up the good work. I am ordering a MRI of the Lumbar spine.  Someone will call you to schedule this appointment within 1 week. Return for followup in 6 months, sooner as needed.  Stroke Prevention Some medical conditions and behaviors are associated with an increased chance of having a stroke. You may prevent a stroke by making healthy choices and managing medical conditions. HOW CAN I REDUCE MY RISK OF HAVING A STROKE?   Stay physically active. Get at least 30 minutes of activity on most or all days.  Do not smoke. It may also be helpful to avoid exposure to secondhand smoke.  Limit alcohol use. Moderate alcohol use is considered to be:  No more than 2 drinks per day for men.  No more than 1 drink per day for nonpregnant women.  Eat healthy foods. This involves  Eating 5 or more servings of fruits and vegetables a day.  Following a diet that addresses high blood pressure (hypertension), high cholesterol, diabetes, or obesity.  Manage your cholesterol levels.  A diet low in saturated fat, trans fat, and cholesterol and high in fiber may control cholesterol levels.  Take any prescribed medicines to control cholesterol as directed by your health care provider.  Manage your diabetes.  A controlled-carbohydrate, controlled-sugar diet is recommended to manage diabetes.  Take any prescribed medicines to control diabetes as directed by your health care provider.  Control your hypertension.  A low-salt (sodium), low-saturated fat, low-trans fat, and low-cholesterol diet is recommended to manage  hypertension.  Take any prescribed medicines to control hypertension as directed by your health care provider.  Maintain a healthy weight.  A reduced-calorie, low-sodium, low-saturated fat, low-trans fat, low-cholesterol diet is recommended to manage weight.  Stop drug abuse.  Avoid taking birth control pills.  Talk to your health care provider about the risks of taking birth control pills if you are over 67 years old, smoke, get migraines, or have ever had a blood clot.  Get evaluated for sleep disorders (sleep apnea).  Talk to your health care provider about getting a sleep evaluation if you snore a lot or have excessive sleepiness.  Take medicines as directed by your health care provider.  For some people, aspirin or blood thinners (anticoagulants) are helpful in reducing the risk of forming abnormal blood clots that can lead to stroke. If you have the irregular heart rhythm of atrial fibrillation, you should be on a blood thinner unless there is a good reason you cannot take them.  Understand all your medicine instructions.  Make sure that other other conditions (such as anemia or atherosclerosis) are addressed. SEEK IMMEDIATE MEDICAL CARE IF:   You have sudden weakness or numbness of the face, arm, or leg, especially on one side of the body.  Your face or eyelid droops to one side.  You have sudden confusion.  You have trouble speaking (aphasia) or understanding.  You have sudden trouble seeing in one or both eyes.  You have sudden trouble walking.  You have dizziness.  You have a loss of balance or coordination.  You have a sudden, severe headache  with no known cause.  You have new chest pain or an irregular heartbeat. Any of these symptoms may represent a serious problem that is an emergency. Do not wait to see if the symptoms will go away. Get medical help at once. Call your local emergency services  (911 in [place] Do not drive yourself to the hospital. Document  Released: 09/19/2004 Document Revised: 06/02/2013 Document Reviewed: 02/12/2013 [facility] Patient Information 2015 [place], [place]. This information is not intended to replace advice given to you by your health care provider. Make sure you discuss any questions you have with your health care provider.

## 2014-03-10 NOTE — Progress Notes (Signed)
I agree with above 

## 2014-03-11 ENCOUNTER — Encounter: Payer: Self-pay | Admitting: Neurology

## 2014-03-14 NOTE — Progress Notes (Signed)
I agree with above 

## 2014-03-14 NOTE — Progress Notes (Signed)
Quick Note:  I reviewed the patient's BiPAP compliance data from 02/09/2014 to 03/10/2014, which is a total of 30 days, during which time the patient used PAP every day except for 1 day. The average usage for all days was 9 hours and 58 minutes. The percent used days greater than 4 hours was 97 %, indicating excellent compliance. The residual AHI was 6.3 per hour, indicating a slightly suboptimal treatment pressure setting. Leak was acceptable. Pressure setting is currently 16/8 with backup rate of 10. Overall I think we can continue with this until his next appointment which is currently scheduled for 04/05/2014 at 10:30 AM. I will review this data with the patient at the next office visit, provide feedback and additional troubleshooting if need be.  Star Age, MD, PhD [facility] ([facility]   ______

## 2014-03-15 ENCOUNTER — Telehealth: Payer: Self-pay

## 2014-03-15 DIAGNOSIS — E138 Other specified diabetes mellitus with unspecified complications: Secondary | ICD-10-CM

## 2014-03-15 MED ORDER — GLUCOSE BLOOD VI STRP: ORAL_STRIP | Status: DC

## 2014-03-15 MED ORDER — ONETOUCH DELICA LANCETS FINE MISC: Status: DC

## 2014-03-15 NOTE — Telephone Encounter (Signed)
[facility] advised that lancets and strips are not being covered d/t pt states he is using more than 1 per day. He is not insulin dependent so Medicare will only pay for QD testing. LMOM for pt to CB w/details of testing sch. New Rx needs to be sent [facility] w/dosing schedule detailed.

## 2014-03-15 NOTE — Telephone Encounter (Signed)
Pt stated that [name] had not advised him to test more than QD, but OKd pt checking more if he would like to. Pt stated he normally checks blood sugar BID and runs pretty consistently between 120-140. Advised pt that Medicare will only pay for QD testing unless pt has fluctuating BS or uncontrolled. Advised pt that since his BS is so consistent and controlled he can check fasting some days and others 2 hrs after biggest meal. Pt agreed and I am sending over new Rxs w/specific directions so Medicare will cover.

## 2014-03-18 ENCOUNTER — Encounter: Payer: Self-pay | Admitting: Neurology

## 2014-03-18 ENCOUNTER — Ambulatory Visit
Admission: RE | Admit: 2014-03-18 | Discharge: 2014-03-18 | Disposition: A | Discharge status: Home / Self Care | Payer: Medicare Other | Source: Ambulatory Visit | Attending: Nurse Practitioner | Admitting: Nurse Practitioner

## 2014-03-18 ENCOUNTER — Ambulatory Visit (INDEPENDENT_AMBULATORY_CARE_PROVIDER_SITE_OTHER): Payer: Medicare Other | Admitting: *Deleted

## 2014-03-18 DIAGNOSIS — I6350 Cerebral infarction due to unspecified occlusion or stenosis of unspecified cerebral artery: Secondary | ICD-10-CM

## 2014-03-18 DIAGNOSIS — M4310 Spondylolisthesis, site unspecified: Secondary | ICD-10-CM | POA: Diagnosis not present

## 2014-03-18 DIAGNOSIS — I639 Cerebral infarction, unspecified: Secondary | ICD-10-CM

## 2014-03-18 DIAGNOSIS — Z981 Arthrodesis status: Secondary | ICD-10-CM

## 2014-03-18 DIAGNOSIS — M5126 Other intervertebral disc displacement, lumbar region: Secondary | ICD-10-CM | POA: Diagnosis not present

## 2014-03-18 DIAGNOSIS — M47817 Spondylosis without myelopathy or radiculopathy, lumbosacral region: Secondary | ICD-10-CM | POA: Diagnosis not present

## 2014-03-18 DIAGNOSIS — R29898 Other symptoms and signs involving the musculoskeletal system: Secondary | ICD-10-CM

## 2014-03-18 DIAGNOSIS — R262 Difficulty in walking, not elsewhere classified: Secondary | ICD-10-CM

## 2014-03-18 LAB — MDC_IDC_ENUM_SESS_TYPE_REMOTE
MDC IDC SESS DTM: 20150724174420
Zone Setting Detection Interval: 2000 ms
Zone Setting Detection Interval: 3000 ms
Zone Setting Detection Interval: 370 ms

## 2014-03-22 NOTE — Progress Notes (Signed)
Quick Note:  I called and spoke to wife and gave her the result note comments from [facility], (my Chart). She stated that there computer was not working,and she would try there phone. If not, I relayed can give copy results after signing release. She verbalized understanding. If would like to have steroid injections, can call us back and we can place order. Dr. Nelva Bush, has done her steroid injections. ______

## 2014-03-23 ENCOUNTER — Telehealth: Payer: Self-pay | Admitting: Neurology

## 2014-03-23 NOTE — Telephone Encounter (Signed)
I called and spoke to wife.  Relayed that Charlott Holler. NP out of office today.  Can address tomorrow.  Dr.[name] may be able to address will send to him as well.  Call back may be tomorrow.

## 2014-03-23 NOTE — Telephone Encounter (Signed)
Patient's wife calling wanting to speak with Jeani Hawking regarding the MRI results for patient. Please return call to patient's wife and advise, she states it is okay to leave a voicemail message.

## 2014-03-23 NOTE — Progress Notes (Signed)
Loop recorder 

## 2014-03-24 ENCOUNTER — Ambulatory Visit: Payer: Medicare Other | Admitting: Neurology

## 2014-03-24 NOTE — Telephone Encounter (Signed)
I spoke with wife.  Relayed that spoke with Dr. Leonie Man, and also last note from [place].  No findings related to his sudden leg weakness.  Dr. Leonie Man and Jeani Hawking to discuss case tomorrow and let her know of plan.

## 2014-03-24 NOTE — Telephone Encounter (Signed)
Please call patient or wife and relay that MRI shows:  1. Progressive severe bilateral facet arthritis at L4-5. However, the spondylolisthesis visible on the prior study is not present on the current exam.  2. Postsurgical changes at L5-S1.  3. Disc bulges at L2-3 and L3-4 without neural impingement.  I do not think there are any findings to explain his sudden leg weakness/

## 2014-03-25 ENCOUNTER — Other Ambulatory Visit: Payer: Self-pay | Admitting: Nurse Practitioner

## 2014-03-25 DIAGNOSIS — Z981 Arthrodesis status: Secondary | ICD-10-CM

## 2014-03-25 DIAGNOSIS — R29898 Other symptoms and signs involving the musculoskeletal system: Secondary | ICD-10-CM

## 2014-03-25 DIAGNOSIS — R262 Difficulty in walking, not elsewhere classified: Secondary | ICD-10-CM

## 2014-03-25 NOTE — Telephone Encounter (Signed)
I reviewed case with Dr. Leonie Man, plan to do NCV/EMG of Bilateral lower extremities to evaluate for radiculopathy. Also CT angiogram Head and Neck to evaluate for high grade stenosis.  Relayed the information to [name] by phone, stating that he would be contacted early next week to schedule appointments for the tests.  He was in agreement and had no other questions.

## 2014-03-29 ENCOUNTER — Other Ambulatory Visit: Payer: Self-pay | Admitting: Family Medicine

## 2014-03-30 ENCOUNTER — Other Ambulatory Visit (HOSPITAL_COMMUNITY): Payer: Self-pay | Admitting: Diagnostic Radiology

## 2014-03-30 DIAGNOSIS — Z9889 Other specified postprocedural states: Secondary | ICD-10-CM | POA: Diagnosis not present

## 2014-03-30 DIAGNOSIS — R262 Difficulty in walking, not elsewhere classified: Secondary | ICD-10-CM | POA: Diagnosis not present

## 2014-03-30 DIAGNOSIS — R29898 Other symptoms and signs involving the musculoskeletal system: Secondary | ICD-10-CM | POA: Diagnosis not present

## 2014-03-31 LAB — CREATININE, SERUM: CREATININE: 0.86 mg/dL (ref 0.50–1.35)

## 2014-03-31 LAB — BUN: BUN: 19 mg/dL (ref 6–23)

## 2014-04-01 ENCOUNTER — Ambulatory Visit
Admission: RE | Admit: 2014-04-01 | Discharge: 2014-04-01 | Disposition: A | Discharge status: Home / Self Care | Payer: Medicare Other | Source: Ambulatory Visit | Attending: Nurse Practitioner | Admitting: Nurse Practitioner

## 2014-04-01 DIAGNOSIS — R262 Difficulty in walking, not elsewhere classified: Secondary | ICD-10-CM

## 2014-04-01 DIAGNOSIS — Z981 Arthrodesis status: Secondary | ICD-10-CM

## 2014-04-01 DIAGNOSIS — I658 Occlusion and stenosis of other precerebral arteries: Secondary | ICD-10-CM | POA: Diagnosis not present

## 2014-04-01 DIAGNOSIS — R29898 Other symptoms and signs involving the musculoskeletal system: Secondary | ICD-10-CM

## 2014-04-01 MED ORDER — IOHEXOL 350 MG/ML SOLN
75.0000 mL | Freq: Once | INTRAVENOUS | Status: AC | PRN
  Administered 2014-04-01: 80 mL via INTRAVENOUS

## 2014-04-01 NOTE — Telephone Encounter (Signed)
Form filled out and faxed in again on 04/01/14 by Tor Netters.

## 2014-04-05 ENCOUNTER — Encounter: Payer: Self-pay | Admitting: Neurology

## 2014-04-05 ENCOUNTER — Ambulatory Visit (INDEPENDENT_AMBULATORY_CARE_PROVIDER_SITE_OTHER): Payer: Medicare Other | Admitting: Neurology

## 2014-04-05 ENCOUNTER — Telehealth: Payer: Self-pay | Admitting: Neurology

## 2014-04-05 VITALS — BP 104/67 | HR 79 | Temp 97.8°F | Ht 66.0 in | Wt 215.0 lb

## 2014-04-05 DIAGNOSIS — G4737 Central sleep apnea in conditions classified elsewhere: Secondary | ICD-10-CM | POA: Diagnosis not present

## 2014-04-05 DIAGNOSIS — G4731 Primary central sleep apnea: Secondary | ICD-10-CM

## 2014-04-05 DIAGNOSIS — R262 Difficulty in walking, not elsewhere classified: Secondary | ICD-10-CM | POA: Diagnosis not present

## 2014-04-05 DIAGNOSIS — I2510 Atherosclerotic heart disease of native coronary artery without angina pectoris: Secondary | ICD-10-CM

## 2014-04-05 DIAGNOSIS — G4733 Obstructive sleep apnea (adult) (pediatric): Secondary | ICD-10-CM

## 2014-04-05 DIAGNOSIS — G4739 Other sleep apnea: Secondary | ICD-10-CM

## 2014-04-05 DIAGNOSIS — E669 Obesity, unspecified: Secondary | ICD-10-CM

## 2014-04-05 DIAGNOSIS — I639 Cerebral infarction, unspecified: Secondary | ICD-10-CM

## 2014-04-05 DIAGNOSIS — I6340 Cerebral infarction due to embolism of unspecified cerebral artery: Secondary | ICD-10-CM

## 2014-04-05 NOTE — Progress Notes (Signed)
Subjective:    Patient ID: [name] is a 68 y.o. male.  HPI   Interim history:   [name] is a very pleasant 68 year old left-handed gentleman with an underlying complex medical history of heart disease, hyperlipidemia, obesity, hypertension, history of seizure disorder, history of atrial flutter, depression, chronic back pain, hearing loss, diverticulitis, history of alcohol and prescription medicine abuse, diabetes, reflux disease, recurrent headaches, and stroke x 3 (in the 00F and embolic stroke in 08/2195), who presents for followup consultation of his obstructive sleep apnea. He is accompanied by his wife again today. I first met him on 12/22/2013 at the request of my colleague Dr. Leonie Man, was him for his stroke, at which time the patient reported a prior diagnosis of obstructive sleep apnea over 5 years ago. He tried CPAP, but was not able to tolerate it, as he kept pulling the mask off. He then tried a dental appliance for about 2 years, which may have helped some and he did have several home sleep tests, per his dentist, but eventually, he stopped using the appliance, and it has been lost or chewed up by the dog. He reported snoring, daytime somnolence, and restless sleep. I suggested reevaluation for obstructive sleep apnea with a sleep study. He had a split-night sleep study on 01/24/2014 and I reviewed his sleep test results with him in detail today. Baseline sleep efficiency was reduced at 44% with a long latency to sleep of 73.5 minutes and wake after sleep onset of 15.5 minutes with moderate sleep fragmentation noted. He had absence of slow-wave sleep and a mildly reduced amount of REM sleep. REM latency was mildly reduced at 67.5 minutes. He had no significant periodic leg movements of sleep. His total AHI was highly elevated at 126.9 per hour, baseline oxygen saturation was 91%, nadir was 79% in REM sleep. He was titrated on CPAP. Sleep efficiency was much improved and arousal index was  much improved. He had a markedly increased amount of REM sleep at 44%. He also achieved deep sleep. He exhibited central respiratory events while on CPAP therapy with more than 50% of the respiratory events being central in nature. He was first tried on CPAP from 5-12 cm as well as different EPR settings. His AHI was not reduced significantly and he had persistent lower oxygen saturations in the low 90s to high 80s. He was therefore switched to BiPAP standard mode and titrated from 12/8 cm to 14/8 cm but had residual central respiratory events was therefore placed on BiPAP ST. He was titrated from 12/7 cm to 14/7 cm and was also briefly tried on ASV. He was switched back to BiPAP ST. Final pressure setting was 16/8 cm with a rate of 10 per minute. I reviewed his compliance data from 02/15/2014 to 03/16/2014 which is a total of 30 days during which time he used his BiPAP every night with percent use more than 4 hours at 100%, indicating superb compliance. Average usage was 10 hours and 14 minutes, sitting as mentioned 16/8 cm with a rate of 10 with a residual AHI of only 6 per hour and very acceptable leak at 19.1 for the 95th percentile. Today, I reviewed his compliance data from 03/06/2014 through 04/04/2014 which is a total of 30 days, during which time he uses BiPAP every night. Percent used days greater than 4 hours was 96%, indicating excellent compliance, residual AHI 5.5 per hour, very acceptable, leak acceptable at 22.2 for the 95th percentile. Settings unchanged. Today, he  reports doing well breathing wise. He has had some leg tremors on the L and it tends to give out. He has been using a rolling walker from the [place] and is doing well with that. He wants to get his drivers license renewed, as it is expiring today, but he is not planning to drive any time soon. He had a CT angiogram head and neck last month and I discussed findings with them briefly. I encouraged him to discuss findings in more detail with  Dr. Leonie Man.   His Past Medical History Is Significant For: Past Medical History  Diagnosis Date  . Coronary artery disease     a. Low level exercise Lex MV 3/14: low risk, EF 62%, inf defect 2/2 diaph attenuation vs artifiact, small area of scar possible, no ischemia  . Hyperlipidemia   . Hypertension   . History of seizure disorder   . History of atrial flutter   . Depression   . Back pain     persistent  . Hearing loss   . Diverticulitis   . H/O alcohol abuse   . Diabetes mellitus     controled by diet  . GERD (gastroesophageal reflux disease)   . Seizures   . Headache(784.0)   . Arthritis     of spine  . Sleep apnea     does not use CPAP  . Hx of echocardiogram     Echo 7/14:  Mod LVH, EF 55-60%, Gr 1 DD, mild MR, PASP 36  . AKI (acute kidney injury)     a. 6/14: resolved after d/c ARB;  b. RA U/S 7/14: no RA stenosis  . Atrial flutter   . Stroke     His Past Surgical History Is Significant For: Past Surgical History  Procedure Laterality Date  . Partial colectomy      for diverticuli  . Orchiectomy    . Umbilical hernia repair    . Cervical fusion    . Coronary artery bypass graft  2005    LIMA graft to LAD,saphenous vein graft to diag.,circumflex, marginal,and to the RCA  . Cardiac catheterization    . Appendectomy    . Esophagogastroduodenoscopy  02/10/2012    Procedure: ESOPHAGOGASTRODUODENOSCOPY (EGD);  Surgeon: Cleotis Nipper, MD;  Location: [facility] ENDOSCOPY;  Service: Endoscopy;  Laterality: N/A;  . Foreign body removal  02/10/2012    Procedure: FOREIGN BODY REMOVAL;  Surgeon: Cleotis Nipper, MD;  Location: [place];  Service: Endoscopy;  Laterality: N/A;  . Back surgery    . Esophagogastroduodenoscopy  06/29/2012    Procedure: ESOPHAGOGASTRODUODENOSCOPY (EGD);  Surgeon: Winfield Cunas., MD;  Location: [facility] ENDOSCOPY;  Service: Endoscopy;  Laterality: N/A;  . Foreign body removal  06/29/2012    Procedure: FOREIGN BODY REMOVAL;  Surgeon: Winfield Cunas., MD;  Location: [facility] ENDOSCOPY;  Service: Endoscopy;  Laterality: N/A;  . Savory dilation  06/29/2012    Procedure: SAVORY DILATION;  Surgeon: Winfield Cunas., MD;  Location: [facility] ENDOSCOPY;  Service: Endoscopy;  Laterality: N/A;  . Tee without cardioversion N/A 11/17/2013    Procedure: TRANSESOPHAGEAL ECHOCARDIOGRAM (TEE);  Surgeon: Larey [facility]er, MD;  Location: [facility] ENDOSCOPY;  Service: Cardiovascular;  Laterality: N/A;    His Family History Is Significant For: Family History  Problem Relation Age of Onset  . Emphysema Mother     was a smoker  . Asthma Mother   . Heart disease Father   . Prostate cancer Paternal Grandfather   .  Pancreatic cancer Paternal Uncle     His Social History Is Significant For: History   Social History  . Marital Status: Married    Spouse Name: [name]    Number of Children: 2  . Years of Education: college   Occupational History  . 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     Social History Main Topics  . Smoking status: Never Smoker   . Smokeless tobacco: Never Used  . Alcohol Use: No     Comment: rare beer  . Drug Use: No  . Sexual Activity: Not Currently   Other Topics Concern  . None   Social History Narrative   Patient lives at home with  wife      Patient drinks 2 cups of coffee a day.patient is left handed    His Allergies Are:  Allergies  Allergen Reactions  . Cephalexin     Unknown  . Doxycycline     Unknown  . Keflex [Cephalexin]   . Lac Bovis Other (See Comments)    lactose intolerant  . Pentazocine Lactate     He passed out- he had a seizure.  This occurred around 2000  :   His Current Medications Are:  Outpatient Encounter Prescriptions as of 04/05/2014  Medication Sig  . atorvastatin (LIPITOR) 80 MG tablet Take 1 tablet (80 mg total) by mouth daily.  . Blood Glucose Monitoring Suppl (ONE TOUCH ULTRA 2) W/DEVICE KIT Use as directed. Dx code: 250.00  . FLUoxetine (PROZAC) 10 MG capsule Take 20 mg by mouth  daily.  . furosemide (LASIX) 20 MG tablet Take 1 -2 tablets by mouth every day as needed. PATIENT NEEDS OFFICE VISIT FOR ADDITIONAL REFILLS - overdue for follow up.  . glucose blood test strip Test blood sugar daily. Dx code 250.00.  Marland Kitchen loperamide (IMODIUM) 2 MG capsule Take 6 mg by mouth daily as needed for diarrhea or loose stools. Per bottle as needed  . methocarbamol (ROBAXIN) 750 MG tablet Take 750 mg by mouth 3 (three) times daily.  . metroNIDAZOLE (FLAGYL) 500 MG tablet Take 1 tablet by mouth daily.  . nebivolol (BYSTOLIC) 10 MG tablet Take 1 tablet (10 mg total) by mouth 2 (two) times daily.  . nitroGLYCERIN (NITROSTAT) 0.4 MG SL tablet Place 1 tablet (0.4 mg total) under the tongue every 5 (five) minutes as needed. For chest pain  . omeprazole (PRILOSEC) 20 MG capsule Take 20 mg by mouth 2 (two) times daily before a meal.  . ONETOUCH DELICA LANCETS FINE MISC Test blood sugar daily. Dx code 250.00  . sitaGLIPtin (JANUVIA) 100 MG tablet Take 100 mg by mouth daily.  . traZODone (DESYREL) 150 MG tablet Take 150 mg by mouth at bedtime.  . [DISCONTINUED] acetaminophen-codeine (TYLENOL #3) 300-30 MG per tablet TAKE 1 TABLET BY MOUTH EVERY 8 hours as needed for pain Ok to fill on 11/10/13.  . [DISCONTINUED] aspirin 325 MG tablet Take 325 mg by mouth daily.  . [DISCONTINUED] metoCLOPramide (REGLAN) 5 MG tablet 1 tablet before each meal and at bedtime  :  Review of Systems:  Out of a complete 14 point review of systems, all are reviewed and negative with the exception of these symptoms as listed below: Review of Systems  Endocrine: Positive for heat intolerance.       Excessive thirst, excessive eating  Neurological: Positive for headaches.       Apnea,memory loss  Psychiatric/Behavioral: Positive for decreased concentration.  Depression    Objective:  Neurologic Exam  Physical Exam Physical Examination:   Filed Vitals:   04/05/14 1056  BP: 104/67  Pulse: 79  Temp: 97.8 F  (36.6 C)    General Examination: The patient is a very pleasant 68 y.o. male in no acute distress. He appears well-developed and well-nourished and adequately groomed. He is situated in a chair.    HEENT: Normocephalic, atraumatic, pupils are equal, round and reactive to light and accommodation. Extraocular tracking is good without limitation to gaze excursion or nystagmus noted. Normal smooth pursuit is noted. Hearing is grossly intact. Tympanic membranes are clear bilaterally. Face is symmetric with normal facial animation and normal facial sensation. Speech shows mild dysarthria. There is no hypophonia. There is no lip, neck/head, jaw or voice tremor. Neck is supple with full range of passive and active motion. There are no carotid bruits on auscultation. Oropharynx exam reveals: severe mouth dryness, adequate dental hygiene and marked airway crowding, due to quite narrow airway entry, and swollen and erythematous soft palate including uvula which is enlarged. Mallampati is class II. Tongue protrudes centrally and palate elevates symmetrically. Tonsils are absent. Neck size is enlarged.    Chest: Clear to auscultation without wheezing, rhonchi or crackles noted.  Heart: S1+S2+0, regular and normal without murmurs, rubs or gallops noted. He has a loop recorder in place.   Abdomen: Soft, non-tender and non-distended with normal bowel sounds appreciated on auscultation.  Extremities: There is trace pitting edema in the distal lower extremities bilaterally. Pedal pulses are intact.  Skin: Warm and dry without trophic changes noted. There are no varicose veins.  Musculoskeletal: exam reveals no obvious joint deformities, tenderness or joint swelling or erythema.   Neurologically:  Mental status: The patient is awake, alert and oriented in all 4 spheres. His immediate and remote memory, attention, language skills and fund of knowledge are fairly appropriate. There is no evidence of aphasia,  agnosia, apraxia or anomia. Speech is clear with normal prosody and enunciation. Thought process is linear but he does have some slowness in thinking and tends to turn to his wife for some of the answers. Mood is constricted and affect is blunted.  Cranial nerves II - XII are as described above under HEENT exam. In addition: shoulder shrug is normal with equal shoulder height noted. Motor exam: Normal bulk, strength and tone is noted on the R and he does have a 4+/5 degree of weakness in his left lower extremity with hip flexion and knee extension primarily. Reflexes are about 1+ throughout. Fine motor skills are mildly impaired on the left. He does not have any overt dysmetria or intention tremor on cerebellar testing. Sensory exam is intact to light touch, pinprick, vibration and temperature sense in the upper extremities but decrease to a mild degree to pinprick and vibration sense in the distal lower extremities bilaterally. Gait, station and balance: He stands up slowly and has a slight limp on the right side. He does walk well with his rolling walker.   Assessment and Plan:  In summary, [name] is a very pleasant 69 year old male with an underlying complex medical history of heart disease, hyperlipidemia, obesity, hypertension, history of seizure disorder, history of atrial flutter, depression, chronic back pain, hearing loss, diverticulitis, history of alcohol and prescription medicine abuse, diabetes, reflux disease, recurrent headaches, and stroke x 3, first in the 90s, including a recent embolic stroke with residual right lower extremity weakness, who presents for followup consultation of his  OSA, on BiPAP therapy. He has been doing well. He is fully compliant with treatment and is encouraged to continue with treatment and greatly commended for being so compliant. He feels much better with BiPAP therapy and his wife adds that he loves his machine. He needs supplies next month. He reports an  intermittent right leg tremor which could be spasms versus a simple motor seizure. He's had a recent CT angiogram head and neck. He has a followup with Dr. Leonie Man for research appointment next week and is encouraged to discuss the findings at the time. Generally speaking I did talk to them about the test results in brief period he does have significant right ICA stenosis and may be a candidate for surgical treatment. Again, I've encouraged him to discuss this with his stroke Dr. Per my end of things he is doing really well. He is encouraged to followup in 6 months from now, sooner if the need arises.

## 2014-04-05 NOTE — Patient Instructions (Signed)
Please continue using your BiPAP regularly. While your insurance requires that you use BiPAP at least 4 hours each night on 70% of the nights, I recommend, that you not skip any nights and use it throughout the night if you can. Getting used to BiPAP and staying with the treatment long term does take time and patience and discipline. Untreated obstructive sleep apnea when it is moderate to severe can have an adverse impact on cardiovascular health and raise her risk for heart disease, arrhythmias, hypertension, congestive heart failure, stroke and diabetes. Untreated obstructive sleep apnea causes sleep disruption, nonrestorative sleep, and sleep deprivation. This can have an impact on your day to day functioning and cause daytime sleepiness and impairment of cognitive function, memory loss, mood disturbance, and problems focussing. Using BiPAP regularly can improve these symptoms.   

## 2014-04-05 NOTE — Telephone Encounter (Signed)
Patient requesting CT Angio results.  Please return call anytime and can leave message on vm

## 2014-04-06 NOTE — Telephone Encounter (Signed)
Patient saw [name] on 03/09/14 and again on yesterday. He had CT done on 03/25/14 and would like the results. Please consult.

## 2014-04-06 NOTE — Telephone Encounter (Signed)
I talked to the patient and his wife regarding his CT angiogram results at the time of his visit yesterday when I saw him for sleep apnea followup. I suggested they discuss further management of his stroke risk factors with Dr. Leonie Man directly. They were in agreement. I will let Dr. Leonie Man give them a call when he is available.

## 2014-04-15 ENCOUNTER — Inpatient Hospital Stay (HOSPITAL_COMMUNITY)
Admission: EM | Admit: 2014-04-15 | Discharge: 2014-04-18 | DRG: 670 | Disposition: A | Discharge status: Home / Self Care | Payer: Medicare Other | Attending: Family Medicine | Admitting: Family Medicine

## 2014-04-15 ENCOUNTER — Encounter: Payer: Medicare Other | Admitting: Neurology

## 2014-04-15 ENCOUNTER — Encounter (HOSPITAL_COMMUNITY): Payer: Self-pay | Admitting: Emergency Medicine

## 2014-04-15 ENCOUNTER — Emergency Department (HOSPITAL_COMMUNITY): Payer: Medicare Other

## 2014-04-15 DIAGNOSIS — F1011 Alcohol abuse, in remission: Secondary | ICD-10-CM

## 2014-04-15 DIAGNOSIS — N23 Unspecified renal colic: Secondary | ICD-10-CM | POA: Diagnosis not present

## 2014-04-15 DIAGNOSIS — R1310 Dysphagia, unspecified: Secondary | ICD-10-CM | POA: Diagnosis present

## 2014-04-15 DIAGNOSIS — R29898 Other symptoms and signs involving the musculoskeletal system: Secondary | ICD-10-CM | POA: Diagnosis present

## 2014-04-15 DIAGNOSIS — I2510 Atherosclerotic heart disease of native coronary artery without angina pectoris: Secondary | ICD-10-CM | POA: Diagnosis present

## 2014-04-15 DIAGNOSIS — J61 Pneumoconiosis due to asbestos and other mineral fibers: Secondary | ICD-10-CM | POA: Diagnosis present

## 2014-04-15 DIAGNOSIS — F3289 Other specified depressive episodes: Secondary | ICD-10-CM | POA: Diagnosis present

## 2014-04-15 DIAGNOSIS — F329 Major depressive disorder, single episode, unspecified: Secondary | ICD-10-CM | POA: Diagnosis present

## 2014-04-15 DIAGNOSIS — E1065 Type 1 diabetes mellitus with hyperglycemia: Secondary | ICD-10-CM

## 2014-04-15 DIAGNOSIS — M1990 Unspecified osteoarthritis, unspecified site: Secondary | ICD-10-CM | POA: Diagnosis not present

## 2014-04-15 DIAGNOSIS — M545 Low back pain, unspecified: Secondary | ICD-10-CM

## 2014-04-15 DIAGNOSIS — F32A Depression, unspecified: Secondary | ICD-10-CM

## 2014-04-15 DIAGNOSIS — N201 Calculus of ureter: Secondary | ICD-10-CM | POA: Diagnosis not present

## 2014-04-15 DIAGNOSIS — E119 Type 2 diabetes mellitus without complications: Secondary | ICD-10-CM | POA: Diagnosis not present

## 2014-04-15 DIAGNOSIS — R109 Unspecified abdominal pain: Secondary | ICD-10-CM | POA: Diagnosis not present

## 2014-04-15 DIAGNOSIS — N200 Calculus of kidney: Secondary | ICD-10-CM | POA: Diagnosis not present

## 2014-04-15 DIAGNOSIS — Z79899 Other long term (current) drug therapy: Secondary | ICD-10-CM | POA: Diagnosis not present

## 2014-04-15 DIAGNOSIS — G8929 Other chronic pain: Secondary | ICD-10-CM | POA: Diagnosis present

## 2014-04-15 DIAGNOSIS — I69998 Other sequelae following unspecified cerebrovascular disease: Secondary | ICD-10-CM

## 2014-04-15 DIAGNOSIS — R0902 Hypoxemia: Secondary | ICD-10-CM | POA: Diagnosis present

## 2014-04-15 DIAGNOSIS — Z951 Presence of aortocoronary bypass graft: Secondary | ICD-10-CM

## 2014-04-15 DIAGNOSIS — K2100 Gastro-esophageal reflux disease with esophagitis, without bleeding: Secondary | ICD-10-CM | POA: Diagnosis not present

## 2014-04-15 DIAGNOSIS — N211 Calculus in urethra: Secondary | ICD-10-CM | POA: Diagnosis not present

## 2014-04-15 DIAGNOSIS — R209 Unspecified disturbances of skin sensation: Secondary | ICD-10-CM

## 2014-04-15 DIAGNOSIS — Z981 Arthrodesis status: Secondary | ICD-10-CM | POA: Diagnosis not present

## 2014-04-15 DIAGNOSIS — R0600 Dyspnea, unspecified: Secondary | ICD-10-CM

## 2014-04-15 DIAGNOSIS — I10 Essential (primary) hypertension: Secondary | ICD-10-CM | POA: Diagnosis present

## 2014-04-15 DIAGNOSIS — Z8669 Personal history of other diseases of the nervous system and sense organs: Secondary | ICD-10-CM

## 2014-04-15 DIAGNOSIS — E785 Hyperlipidemia, unspecified: Secondary | ICD-10-CM | POA: Diagnosis present

## 2014-04-15 DIAGNOSIS — K219 Gastro-esophageal reflux disease without esophagitis: Secondary | ICD-10-CM | POA: Diagnosis present

## 2014-04-15 DIAGNOSIS — R0602 Shortness of breath: Secondary | ICD-10-CM | POA: Diagnosis not present

## 2014-04-15 DIAGNOSIS — G4733 Obstructive sleep apnea (adult) (pediatric): Secondary | ICD-10-CM

## 2014-04-15 DIAGNOSIS — E109 Type 1 diabetes mellitus without complications: Secondary | ICD-10-CM

## 2014-04-15 HISTORY — DX: Calculus of kidney: N20.0

## 2014-04-15 LAB — URINALYSIS, ROUTINE W REFLEX MICROSCOPIC
Bilirubin Urine: NEGATIVE
Glucose, UA: NEGATIVE mg/dL
KETONES UR: 15 mg/dL — AB
LEUKOCYTES UA: NEGATIVE
NITRITE: NEGATIVE
Protein, ur: NEGATIVE mg/dL
Specific Gravity, Urine: 1.022 (ref 1.005–1.030)
Urobilinogen, UA: 1 mg/dL (ref 0.0–1.0)
pH: 6 (ref 5.0–8.0)

## 2014-04-15 LAB — GLUCOSE, CAPILLARY
GLUCOSE-CAPILLARY: 109 mg/dL — AB (ref 70–99)
Glucose-Capillary: 123 mg/dL — ABNORMAL HIGH (ref 70–99)

## 2014-04-15 LAB — HEMOGLOBIN A1C
HEMOGLOBIN A1C: 7 % — AB (ref <5.7)
MEAN PLASMA GLUCOSE: 154 mg/dL — AB (ref <117)

## 2014-04-15 LAB — BASIC METABOLIC PANEL
Anion gap: 17 — ABNORMAL HIGH (ref 5–15)
BUN: 27 mg/dL — ABNORMAL HIGH (ref 6–23)
CHLORIDE: 101 meq/L (ref 96–112)
CO2: 21 meq/L (ref 19–32)
Calcium: 9.2 mg/dL (ref 8.4–10.5)
Creatinine, Ser: 1.14 mg/dL (ref 0.50–1.35)
GFR calc Af Amer: 73 mL/min — ABNORMAL LOW (ref >90)
GFR calc non Af Amer: 63 mL/min — ABNORMAL LOW (ref >90)
Glucose, Bld: 144 mg/dL — ABNORMAL HIGH (ref 70–99)
Potassium: 3.9 mEq/L (ref 3.7–5.3)
SODIUM: 139 meq/L (ref 137–147)

## 2014-04-15 LAB — CBC WITH DIFFERENTIAL/PLATELET
BASOS ABS: 0 10*3/uL (ref 0.0–0.1)
Basophils Relative: 0 % (ref 0–1)
Eosinophils Absolute: 0.1 10*3/uL (ref 0.0–0.7)
Eosinophils Relative: 1 % (ref 0–5)
HEMATOCRIT: 37 % — AB (ref 39.0–52.0)
Hemoglobin: 12.8 g/dL — ABNORMAL LOW (ref 13.0–17.0)
LYMPHS ABS: 1.5 10*3/uL (ref 0.7–4.0)
LYMPHS PCT: 19 % (ref 12–46)
MCH: 32.2 pg (ref 26.0–34.0)
MCHC: 34.6 g/dL (ref 30.0–36.0)
MCV: 93.2 fL (ref 78.0–100.0)
Monocytes Absolute: 0.9 10*3/uL (ref 0.1–1.0)
Monocytes Relative: 11 % (ref 3–12)
Neutro Abs: 5.7 10*3/uL (ref 1.7–7.7)
Neutrophils Relative %: 69 % (ref 43–77)
PLATELETS: 203 10*3/uL (ref 150–400)
RBC: 3.97 MIL/uL — AB (ref 4.22–5.81)
RDW: 14.2 % (ref 11.5–15.5)
WBC: 8.2 10*3/uL (ref 4.0–10.5)

## 2014-04-15 LAB — URINE MICROSCOPIC-ADD ON

## 2014-04-15 MED ORDER — INSULIN ASPART 100 UNIT/ML ~~LOC~~ SOLN
0.0000 [IU] | Freq: Three times a day (TID) | SUBCUTANEOUS | Status: DC
  Administered 2014-04-16: 1 [IU] via SUBCUTANEOUS
  Administered 2014-04-16: 2 [IU] via SUBCUTANEOUS
  Administered 2014-04-16: 3 [IU] via SUBCUTANEOUS
  Administered 2014-04-17 (×2): 1 [IU] via SUBCUTANEOUS
  Administered 2014-04-17: 2 [IU] via SUBCUTANEOUS

## 2014-04-15 MED ORDER — FLUOXETINE HCL 20 MG PO CAPS
20.0000 mg | ORAL_CAPSULE | Freq: Every day | ORAL | Status: DC
  Administered 2014-04-15 – 2014-04-18 (×3): 20 mg via ORAL
  Filled 2014-04-15 (×4): qty 1

## 2014-04-15 MED ORDER — CIPROFLOXACIN IN D5W 400 MG/200ML IV SOLN
400.0000 mg | INTRAVENOUS | Status: AC
  Administered 2014-04-16: 400 mg via INTRAVENOUS
  Filled 2014-04-15: qty 200

## 2014-04-15 MED ORDER — SODIUM CHLORIDE 0.9 % IV SOLN
INTRAVENOUS | Status: AC
  Administered 2014-04-15: 12:00:00 via INTRAVENOUS

## 2014-04-15 MED ORDER — DIPHENHYDRAMINE HCL 50 MG/ML IJ SOLN: 12.5000 mg | Freq: Four times a day (QID) | INTRAMUSCULAR | Status: DC | PRN

## 2014-04-15 MED ORDER — PANTOPRAZOLE SODIUM 40 MG PO TBEC
40.0000 mg | DELAYED_RELEASE_TABLET | Freq: Every day | ORAL | Status: DC
  Administered 2014-04-15 – 2014-04-18 (×3): 40 mg via ORAL
  Filled 2014-04-15 (×2): qty 1

## 2014-04-15 MED ORDER — ONDANSETRON HCL 4 MG/2ML IJ SOLN: 4.0000 mg | Freq: Four times a day (QID) | INTRAMUSCULAR | Status: DC | PRN

## 2014-04-15 MED ORDER — HYDROMORPHONE HCL PF 1 MG/ML IJ SOLN
1.0000 mg | Freq: Once | INTRAMUSCULAR | Status: AC
  Administered 2014-04-15: 1 mg via INTRAVENOUS
  Filled 2014-04-15: qty 1

## 2014-04-15 MED ORDER — SODIUM CHLORIDE 0.9 % IV SOLN: INTRAVENOUS | Status: AC

## 2014-04-15 MED ORDER — DIPHENHYDRAMINE HCL 50 MG/ML IJ SOLN
12.5000 mg | Freq: Four times a day (QID) | INTRAMUSCULAR | Status: DC | PRN
  Administered 2014-04-17: 12.5 mg via INTRAVENOUS
  Filled 2014-04-15: qty 1

## 2014-04-15 MED ORDER — HEPARIN SODIUM (PORCINE) 5000 UNIT/ML IJ SOLN
5000.0000 [IU] | Freq: Three times a day (TID) | INTRAMUSCULAR | Status: DC
  Administered 2014-04-15 (×2): 5000 [IU] via SUBCUTANEOUS
  Filled 2014-04-15 (×6): qty 1

## 2014-04-15 MED ORDER — SODIUM CHLORIDE 0.9 % IJ SOLN
3.0000 mL | Freq: Two times a day (BID) | INTRAMUSCULAR | Status: DC
  Administered 2014-04-15 – 2014-04-17 (×3): 3 mL via INTRAVENOUS

## 2014-04-15 MED ORDER — ASPIRIN 81 MG PO CHEW
81.0000 mg | CHEWABLE_TABLET | Freq: Every day | ORAL | Status: DC
  Administered 2014-04-15 – 2014-04-18 (×3): 81 mg via ORAL
  Filled 2014-04-15 (×3): qty 1

## 2014-04-15 MED ORDER — DIPHENHYDRAMINE HCL 12.5 MG/5ML PO ELIX
12.5000 mg | ORAL_SOLUTION | Freq: Four times a day (QID) | ORAL | Status: DC | PRN
  Filled 2014-04-15: qty 5

## 2014-04-15 MED ORDER — SODIUM CHLORIDE 0.9 % IJ SOLN: 9.0000 mL | INTRAMUSCULAR | Status: DC | PRN

## 2014-04-15 MED ORDER — NALOXONE HCL 0.4 MG/ML IJ SOLN: 0.4000 mg | INTRAMUSCULAR | Status: DC | PRN

## 2014-04-15 MED ORDER — FENTANYL CITRATE 0.05 MG/ML IJ SOLN
100.0000 ug | Freq: Once | INTRAMUSCULAR | Status: AC
  Administered 2014-04-15: 100 ug via INTRAVENOUS
  Filled 2014-04-15: qty 2

## 2014-04-15 MED ORDER — NALOXONE HCL 0.4 MG/ML IJ SOLN
0.4000 mg | INTRAMUSCULAR | Status: DC | PRN
Start: 2014-04-15 — End: 2014-04-15

## 2014-04-15 MED ORDER — ATORVASTATIN CALCIUM 80 MG PO TABS
80.0000 mg | ORAL_TABLET | Freq: Every day | ORAL | Status: DC
  Administered 2014-04-15 – 2014-04-17 (×3): 80 mg via ORAL
  Filled 2014-04-15 (×4): qty 1

## 2014-04-15 MED ORDER — TAMSULOSIN HCL 0.4 MG PO CAPS
0.4000 mg | ORAL_CAPSULE | Freq: Once | ORAL | Status: AC
  Administered 2014-04-15: 0.4 mg via ORAL
  Filled 2014-04-15: qty 1

## 2014-04-15 MED ORDER — NEBIVOLOL HCL 10 MG PO TABS
10.0000 mg | ORAL_TABLET | Freq: Two times a day (BID) | ORAL | Status: DC
  Administered 2014-04-15 – 2014-04-18 (×6): 10 mg via ORAL
  Filled 2014-04-15 (×8): qty 1

## 2014-04-15 MED ORDER — FENTANYL 10 MCG/ML IV SOLN
INTRAVENOUS | Status: DC
  Administered 2014-04-15: 12:00:00 via INTRAVENOUS
  Filled 2014-04-15: qty 50

## 2014-04-15 MED ORDER — MORPHINE SULFATE (PF) 1 MG/ML IV SOLN
INTRAVENOUS | Status: DC
  Administered 2014-04-15: 11 mg via INTRAVENOUS
  Administered 2014-04-15: 17:00:00 via INTRAVENOUS
  Administered 2014-04-16: 6 mg via INTRAVENOUS
  Administered 2014-04-16: 1 mg via INTRAVENOUS
  Administered 2014-04-16: 4.782 mg via INTRAVENOUS
  Administered 2014-04-16: 2 mg via INTRAVENOUS
  Administered 2014-04-16: 14:00:00 via INTRAVENOUS
  Administered 2014-04-16: 2 mg via INTRAVENOUS
  Administered 2014-04-17: 4 mg via INTRAVENOUS
  Administered 2014-04-17: 02:00:00 via INTRAVENOUS
  Administered 2014-04-17: 6 mg via INTRAVENOUS
  Administered 2014-04-17: 1 mg via INTRAVENOUS
  Administered 2014-04-17: 14 mg via INTRAVENOUS
  Administered 2014-04-17: 9 mg via INTRAVENOUS
  Filled 2014-04-15 (×4): qty 25

## 2014-04-15 MED ORDER — SODIUM CHLORIDE 0.9 % IV SOLN
INTRAVENOUS | Status: DC
  Administered 2014-04-15: 05:00:00 via INTRAVENOUS

## 2014-04-15 MED ORDER — ONDANSETRON HCL 4 MG/2ML IJ SOLN
4.0000 mg | Freq: Once | INTRAMUSCULAR | Status: AC
Start: 2014-04-15 — End: 2014-04-15
  Administered 2014-04-15: 4 mg via INTRAVENOUS
  Filled 2014-04-15: qty 2

## 2014-04-15 MED ORDER — TRAZODONE HCL 150 MG PO TABS
150.0000 mg | ORAL_TABLET | Freq: Every day | ORAL | Status: DC
  Administered 2014-04-15 – 2014-04-17 (×3): 150 mg via ORAL
  Filled 2014-04-15 (×4): qty 1

## 2014-04-15 NOTE — ED Notes (Signed)
Pt presents with left side pain that radiates into left side of his back since yesterday, denies difficulty or pain with urination.  Reports some nausea.

## 2014-04-15 NOTE — Progress Notes (Signed)
Placed patient on CPAP for the night with oxygen set at 2lpm.

## 2014-04-15 NOTE — ED Notes (Signed)
Patient spontaneously dropped his oxygen saturation levels several times into the 70's. Encouraged deep breathing without success. Applied oxygen at 3L [place] and patients levels came up. EDP made aware.

## 2014-04-15 NOTE — H&P (Signed)
[facility] Admission History and Physical Service Pager: [phone]  Patient name: [name] Medical record number: [id number] Date of birth: June 29, 1946 Age: 68 y.o. Gender: male  Primary Care Provider: [name], MD Consultants: urology Code Status: Full  Chief Complaint: Flank pain  Assessment and Plan: [name] is a 68 y.o. male presenting with renal colic due to obstructing L renal stone . PMH is significant for CVA, CAD, DM2, asbestosis, Seizure d/o, HTN, HLD, atrial flutter, OSA on BiPAP, chronic back pain with Rx and EtOH abuse, and dysphagia. He was admitted in 10/2013 for CVA.   Renal stone/renal colic - Renal colic with 6 mm obstructing stone in left ureter on CT. - Ua with large blood, Cre trending up but reasonable (0.87->1.14) - Consulting Urology, Appreciate recommendations and management - NPO in anticipation of possible procedure, hold lasix, NS at 75 ml/hr - Trend cre, avoiding NSAIDs due to uptrending Cre and PMH of renal failure  Hypoxia, pleural plaques consistent with asbestosis, OSA - Current Hypoxia likely from Opiate medication, monitor for signs of infection - s/p 1 mg dilaudid and 100 mcg fentanyl X 2 in the ED.  - Change tyo fentanyl PCA - CXR and exam clear - qHS BiPAP (16/8 cm with a rate of 10 per minute per Dr. Guadelupe Sabin note on 8/11) - monitor closely  HTN history of CAD status post CABG - Reasonably well controlled today, especially considering his pain - Continue home meds except for Lasix, no clear history of CHF in the last echo only shows mild LVH. - Continue nebivolol - Monitor - goal less than 130/90 - no EKG checked yet, will check considering desats  DM2 - Previous A1c 6.4 and 2014 - Controlled on Januvia at home, hold Januvia and start sliding scale insulin - Check A1c  Dysphagia - Dates that he's had workup for this with outpatient GI, followup outpatient. - It appears that he tolerates a  normal diet, he would like to avoid capsules.  H/o CVA X 3 - Recent CT angio of head and neck with extensive atherosclerotic disease - Continue statin and aspirin - f/u with Dr. Leonie Man OP  FEN/GI: N.p.o. for now, holding Lasix, NS at 75 mls per hour x12 hours Prophylaxis: Subcutaneous heparin, PPI per home for GERD  Disposition: Telemetry for close resp monitoring and IV pain meds  History of Present Illness: [name] is a 68 y.o. male presenting with left flank pain for last one day. He describes the pain as sharp and radiating around to his abdomen. He denies any alleviating or x-ray factors. He describes the pain as a 10 lash 10 improves to an 8/10 with IV pain medication. He also notes that he's had chills since the onset of the pain. He denies any overt fever, dyspnea, chest pain, abdominal pain, or problems with urination. He denies any blood in his urine.   He has been told that he has renal stones before but has never had any procedures to address them. He has also been seen by nephrologist. He states that he was recently started on BiPAP for sleep apnea and has been resting very well since that was started. He has a history of 3 strokes and has residual right fourth and fifth digit numbness and left leg weakness from that.  He came to the ER about 3 AM this morning. Since that time he's received 1 mg of Dilaudid in the 100 mcg of fentanyl twice. His  pain has improved from a 10/10 to 8/10 but he developed transient hypoxia after the administration of pain medications. We were called for admission considering his hypoxia and need for IV pain meds.  Review Of Systems: Per HPI, Otherwise 12 point review of systems was performed and was unremarkable.  Patient Active Problem List   Diagnosis Date Noted  . Ureteral colic 81/08/7508  . Obstructive sleep apnea 12/07/2013  . Obesity, unspecified 12/07/2013  . Embolic stroke 25/85/2778  . Stroke 11/14/2013  . Cough 06/09/2013  . S/P  lumbar spine operation 06/02/2013  . Nerve root pain 06/02/2013  . Pleural plaque consistent with asbestos exposure by CT 10/2012 02/17/2013  . Renal failure 02/13/2013  . Dyspnea 02/11/2013  . Dysphagia, unspecified(787.20) 05/12/2012  . Seizure disorder 04/04/2012  . CAD (coronary artery disease) 06/13/2011  . HTN (hypertension) 06/13/2011  . Diabetes mellitus 06/13/2011  . History of seizure disorder   . Depression   . Back pain   . Hearing loss   . Arrhythmia   . Diverticulitis   . H/O alcohol abuse   . Personal history of other diseases of circulatory system 05/29/2011   Past Medical History: Past Medical History  Diagnosis Date  . Coronary artery disease     a. Low level exercise Lex MV 3/14: low risk, EF 62%, inf defect 2/2 diaph attenuation vs artifiact, small area of scar possible, no ischemia  . Hyperlipidemia   . Hypertension   . History of seizure disorder   . History of atrial flutter   . Depression   . Back pain     persistent  . Hearing loss   . Diverticulitis   . H/O alcohol abuse   . Diabetes mellitus     controled by diet  . GERD (gastroesophageal reflux disease)   . Seizures   . Headache(784.0)   . Arthritis     of spine  . Sleep apnea     does not use CPAP  . Hx of echocardiogram     Echo 7/14:  Mod LVH, EF 55-60%, Gr 1 DD, mild MR, PASP 36  . AKI (acute kidney injury)     a. 6/14: resolved after d/c ARB;  b. RA U/S 7/14: no RA stenosis  . Atrial flutter   . Stroke    Past Surgical History: Past Surgical History  Procedure Laterality Date  . Partial colectomy      for diverticuli  . Orchiectomy    . Umbilical hernia repair    . Cervical fusion    . Coronary artery bypass graft  2005    LIMA graft to LAD,saphenous vein graft to diag.,circumflex, marginal,and to the RCA  . Cardiac catheterization    . Appendectomy    . Esophagogastroduodenoscopy  02/10/2012    Procedure: ESOPHAGOGASTRODUODENOSCOPY (EGD);  Surgeon: Cleotis Nipper, MD;   Location[facility] ENDOSCOPY;  Service: Endoscopy;  Laterality: N/A;  . Foreign body removal  02/10/2012    Procedure: FOREIGN BODY REMOVAL;  Surgeon: Cleotis Nipper, MD;  Location: [place];  Service: Endoscopy;  Laterality: N/A;  . Back surgery    . Esophagogastroduodenoscopy  06/29/2012    Procedure: ESOPHAGOGASTRODUODENOSCOPY (EGD);  Surgeon: Winfield Cunas., MD;  Location: [facility] ENDOSCOPY;  Service: Endoscopy;  Laterality: N/A;  . Foreign body removal  06/29/2012    Procedure: FOREIGN BODY REMOVAL;  Surgeon: Winfield Cunas., MD;  Location: [facility] ENDOSCOPY;  Service: Endoscopy;  Laterality: N/A;  . Savory dilation  06/29/2012  Procedure: SAVORY DILATION;  Surgeon: Winfield Cunas., MD;  Location: [facility] ENDOSCOPY;  Service: Endoscopy;  Laterality: N/A;  . Tee without cardioversion N/A 11/17/2013    Procedure: TRANSESOPHAGEAL ECHOCARDIOGRAM (TEE);  Surgeon: Larey Dresser, MD;  Location: [facility] ENDOSCOPY;  Service: Cardiovascular;  Laterality: N/A;   Social History: History  Substance Use Topics  . Smoking status: Never Smoker   . Smokeless tobacco: Never Used  . Alcohol Use: No     Comment: rare beer   Additional social history: Using BiPAP nightly Please also refer to relevant sections of EMR.  Family History: Family History  Problem Relation Age of Onset  . Emphysema Mother     was a smoker  . Asthma Mother   . Heart disease Father   . Prostate cancer Paternal Grandfather   . Pancreatic cancer Paternal Uncle    Allergies and Medications: Allergies  Allergen Reactions  . Cephalexin     Unknown  . Doxycycline     Unknown  . Keflex [Cephalexin]   . Lac Bovis Other (See Comments)    lactose intolerant  . Pentazocine Lactate     He passed out- he had a seizure.  This occurred around 2000   No current facility-administered medications on file prior to encounter.   Current Outpatient Prescriptions on File Prior to Encounter  Medication Sig Dispense Refill  . atorvastatin  (LIPITOR) 80 MG tablet Take 1 tablet (80 mg total) by mouth daily.  30 tablet  11  . FLUoxetine (PROZAC) 10 MG capsule Take 20 mg by mouth daily.      . methocarbamol (ROBAXIN) 750 MG tablet Take 750 mg by mouth 3 (three) times daily.      . metroNIDAZOLE (FLAGYL) 500 MG tablet Take 1 tablet by mouth once a week.       . nebivolol (BYSTOLIC) 10 MG tablet Take 1 tablet (10 mg total) by mouth 2 (two) times daily.  60 tablet  5  . omeprazole (PRILOSEC) 20 MG capsule Take 20 mg by mouth 2 (two) times daily before a meal.      . sitaGLIPtin (JANUVIA) 100 MG tablet Take 100 mg by mouth daily.      . traZODone (DESYREL) 150 MG tablet Take 150 mg by mouth at bedtime.      . nitroGLYCERIN (NITROSTAT) 0.4 MG SL tablet Place 1 tablet (0.4 mg total) under the tongue every 5 (five) minutes as needed. For chest pain  25 tablet  6    Objective: BP 140/77  Pulse 81  Temp(Src) 97.7 F (36.5 C) (Oral)  Resp 20  SpO2 95% Exam: Gen: NAD, alert, cooperative with exam, described as "loopy" by his wife HEENT: NCAT  CV: RRR, good S1/S2, no murmur Resp: CTABL, no wheezes, non-labored Abd: Soft with diffuse tenderness to palpation which was greatest in LLQ, no guarding Ext: No edema, warm, 2+ DP pulses Neuro: Alert and oriented, 5/5 in bilateral lower extremities, alert and oriented to person, place, time and situation.   Labs and Imaging: CBC BMET   Recent Labs Lab 04/15/14 0429  WBC 8.2  HGB 12.8*  HCT 37.0*  PLT 203    Recent Labs Lab 04/15/14 0429  NA 139  K 3.9  CL 101  CO2 21  BUN 27*  CREATININE 1.14  GLUCOSE 144*  CALCIUM 9.2     UA with 15 ketones, negative leukocytes and nitrites, large hemoglobin, too numerous to count RBCs  EKG pending  Chest x-ray 04/15/2014 IMPRESSION:  No acute disease.  CT renal stone study 04/15/2014  IMPRESSION:  1. Obstructing 6 mm stone in the mid left ureter.  2. Nonobstructive left nephrolithiasis.  3. Chronic findings are stable from prior  and noted above.    Timmothy Euler, MD 04/15/2014, 8:02 AM PGY-3, [place] Intern pager: [phone], text pages welcome

## 2014-04-15 NOTE — H&P (Signed)
[facility]MTS Attending Admission Note: Annabell Sabal MD Personal pager:  [phone] FPTS Service Pager:  [phone]  I  have seen and examined this patient, reviewed their chart. I have discussed this patient with the resident. I agree with the resident's findings, assessment and care plan.  Additionally:  68 yo M with PMH for CVA, CAD, DM2, and other chronic medical issues who presents with Left sided obstructing nephrolithiasis and renal colic.  Pain present for 1 day.  Currently states about 50% reduction in pain with Fentanyl PCA.  Denies any hematuria.  On exam, with pretty diffuse abdominal pain, but greatest tenderness LLQ and Left flank.  Some mild guarding, no rebound.   1.  Nephrolithiasis: - CT revealed 6 mm stone Left ureter, obstructing.   - Continue PCA for pain relief.   - Awaiting urology consult for further management.  - creatine WNL currently.  Will trend  2. Hypoxia:  - history of asbestosis and decreased RR secondary to narcotic administration in ED - Continue CO2 monitoring while on PCA - Bipap at night  Alveda Reasons, MD 04/15/2014 5:07 PM

## 2014-04-15 NOTE — ED Provider Notes (Signed)
CSN: [id number]     Arrival date & time 04/15/14  0239 History   First MD Initiated Contact with Patient 04/15/14 [phone]     Chief Complaint  Patient presents with  . Flank Pain     (Consider location/radiation/quality/duration/timing/severity/associated sxs/prior Treatment) Patient is a 68 y.o. male presenting with flank pain. The history is provided by the patient.  Flank Pain   Presents for evaluation of left flank pain that radiates to his abdomen. The pain started yesterday. The pain is colicky. He's never had this previously. He denies fever, chills, nausea or vomiting, weakness, or dizziness. There's been no change in the color of his urine. No dysuria or urinary frequency. He is taking his usual medications, without relief. There are no other known modifying factors.  Past Medical History  Diagnosis Date  . Coronary artery disease     a. Low level exercise Lex MV 3/14: low risk, EF 62%, inf defect 2/2 diaph attenuation vs artifiact, small area of scar possible, no ischemia  . Hyperlipidemia   . Hypertension   . History of seizure disorder   . History of atrial flutter   . Depression   . Back pain     persistent  . Hearing loss   . Diverticulitis   . H/O alcohol abuse   . Diabetes mellitus     controled by diet  . GERD (gastroesophageal reflux disease)   . Seizures   . Headache(784.0)   . Arthritis     of spine  . Sleep apnea     does not use CPAP  . Hx of echocardiogram     Echo 7/14:  Mod LVH, EF 55-60%, Gr 1 DD, mild MR, PASP 36  . AKI (acute kidney injury)     a. 6/14: resolved after d/c ARB;  b. RA U/S 7/14: no RA stenosis  . Atrial flutter   . Stroke   . Shortness of breath   . Renal stone 04/15/2014   Past Surgical History  Procedure Laterality Date  . Partial colectomy      for diverticuli  . Orchiectomy    . Umbilical hernia repair    . Cervical fusion    . Coronary artery bypass graft  2005    LIMA graft to LAD,saphenous vein graft to  diag.,circumflex, marginal,and to the RCA  . Cardiac catheterization    . Appendectomy    . Esophagogastroduodenoscopy  02/10/2012    Procedure: ESOPHAGOGASTRODUODENOSCOPY (EGD);  Surgeon: Cleotis Nipper, MD;  Location: [facility] ENDOSCOPY;  Service: Endoscopy;  Laterality: N/A;  . Foreign body removal  02/10/2012    Procedure: FOREIGN BODY REMOVAL;  Surgeon: Cleotis Nipper, MD;  Location: [place];  Service: Endoscopy;  Laterality: N/A;  . Back surgery    . Esophagogastroduodenoscopy  06/29/2012    Procedure: ESOPHAGOGASTRODUODENOSCOPY (EGD);  Surgeon: Winfield Cunas., MD;  Location: [facility]OS[facility];  Service: Endoscopy;  Laterality: N/A;  . Foreign body removal  06/29/2012    Procedure: FOREIGN BODY REMOVAL;  Surgeon: Winfield Cunas., MD;  Location: [facility] ENDOSCOPY;  Service: Endoscopy;  Laterality: N/A;  . Savory dilation  06/29/2012    Procedure: SAVORY DILATION;  Surgeon: Winfield Cunas., MD;  Location: [facility] ENDOSCOPY;  Service: Endoscopy;  Laterality: N/A;  . Tee without cardioversion N/A 11/17/2013    Procedure: TRANSESOPHAGEAL ECHOCARDIOGRAM (TEE);  Surgeon: Larey Dresser, MD;  Location: [facility] ENDOSCOPY;  Service: Cardiovascular;  Laterality: N/A;   Family History  Problem Relation Age  of Onset  . Emphysema Mother     was a smoker  . Asthma Mother   . Heart disease Father   . Prostate cancer Paternal Grandfather   . Pancreatic cancer Paternal Uncle    History  Substance Use Topics  . Smoking status: Never Smoker   . Smokeless tobacco: Never Used  . Alcohol Use: No     Comment: rare beer    Review of Systems  Genitourinary: Positive for flank pain.  All other systems reviewed and are negative.     Allergies  Cephalexin; Doxycycline; Keflex; Lac bovis; and Pentazocine lactate  Home Medications   Prior to Admission medications   Medication Sig Start Date End Date Taking? Authorizing Provider  atorvastatin (LIPITOR) 80 MG tablet Take 1 tablet (80 mg total) by mouth  daily. 10/29/13  Yes Gay Filler Copland, MD  FLUoxetine (PROZAC) 10 MG capsule Take 20 mg by mouth daily.   Yes Historical Provider, MD  furosemide (LASIX) 20 MG tablet Take 40 mg by mouth daily.   Yes Historical Provider, MD  nebivolol (BYSTOLIC) 10 MG tablet Take 1 tablet (10 mg total) by mouth 2 (two) times daily.   Yes Tanda Rockers, MD  omeprazole (PRILOSEC) 20 MG capsule Take 20 mg by mouth 2 (two) times daily before a meal.   Yes Historical Provider, MD  sitaGLIPtin (JANUVIA) 100 MG tablet Take 100 mg by mouth daily.   Yes Historical Provider, MD  traZODone (DESYREL) 150 MG tablet Take 150 mg by mouth at bedtime.   Yes Historical Provider, MD  baclofen (LIORESAL) 10 MG tablet Take 1 tablet (10 mg total) by mouth 3 (three) times daily. 04/18/14   Aquilla Hacker, MD  HYDROcodone-acetaminophen (NORCO) 5-325 MG per tablet Take 1 tablet by mouth every 6 (six) hours as needed for moderate pain. 04/16/14   Malka So, MD  nitroGLYCERIN (NITROSTAT) 0.4 MG SL tablet Place 1 tablet (0.4 mg total) under the tongue every 5 (five) minutes as needed. For chest pain 08/06/12   Peter M Martinique, MD  ondansetron (ZOFRAN) 4 MG tablet Take 1 tablet (4 mg total) by mouth every 8 (eight) hours as needed for nausea or vomiting. 04/16/14   Malka So, MD  phenazopyridine (PYRIDIUM) 200 MG tablet Take 1 tablet (200 mg total) by mouth 3 (three) times daily as needed for pain. 04/16/14   Malka So, MD   BP 114/69  Pulse 79  Temp(Src) 98.1 F (36.7 C) (Oral)  Resp 18  Ht 5' 6"  (1.676 m)  Wt 215 lb 1.6 oz (97.569 kg)  BMI 34.73 kg/m2  SpO2 93% Physical Exam  Nursing note and vitals reviewed. Constitutional: He is oriented to person, place, and time. He appears well-developed and well-nourished.  HENT:  Head: Normocephalic and atraumatic.  Right Ear: External ear normal.  Left Ear: External ear normal.  Eyes: Conjunctivae and EOM are normal. Pupils are equal, round, and reactive to light.  Neck: Normal  range of motion and phonation normal. Neck supple.  Cardiovascular: Normal rate, regular rhythm, normal heart sounds and intact distal pulses.   Pulmonary/Chest: Effort normal and breath sounds normal. He exhibits no bony tenderness.  Abdominal: Soft. Bowel sounds are normal. He exhibits no distension and no mass. There is tenderness (mild left mid abdominal tenderness.). There is no rebound and no guarding.  Genitourinary:  No costovertebral angle tenderness, to percussion.  Musculoskeletal: Normal range of motion.  Neurological: He is alert and oriented to person,  place, and time. No cranial nerve deficit or sensory deficit. He exhibits normal muscle tone. Coordination normal.  Skin: Skin is warm, dry and intact.  Psychiatric: He has a normal mood and affect. His behavior is normal. Judgment and thought content normal.    ED Course  Procedures (including critical care time)  Medications  0.9 %  sodium chloride infusion ( Intravenous Not Given 04/15/14 1158)  FLUoxetine (PROZAC) capsule 20 mg (20 mg Oral Given 04/18/14 1008)  nebivolol (BYSTOLIC) tablet 10 mg (10 mg Oral Given 04/18/14 1009)  atorvastatin (LIPITOR) tablet 80 mg (80 mg Oral Given 04/17/14 1645)  pantoprazole (PROTONIX) EC tablet 40 mg (40 mg Oral Given 04/18/14 1009)  traZODone (DESYREL) tablet 150 mg (150 mg Oral Given 04/17/14 2123)  sodium chloride 0.9 % injection 3 mL (3 mLs Intravenous Not Given 04/18/14 1013)  insulin aspart (novoLOG) injection 0-9 Units (0 Units Subcutaneous Not Given 04/18/14 1130)  0.9 %  sodium chloride infusion ( Intravenous Stopped 04/16/14 1209)  aspirin chewable tablet 81 mg (81 mg Oral Given 04/18/14 1009)  0.45 % sodium chloride infusion ( Intravenous New Bag/Given 04/16/14 0726)  phenazopyridine (PYRIDIUM) tablet 200 mg (200 mg Oral Given 04/17/14 1652)  morphine 2 MG/ML injection 2 mg (not administered)  oxyCODONE (Oxy IR/ROXICODONE) immediate release tablet 15 mg (15 mg Oral Given 04/18/14 0906)   HYDROmorphone (DILAUDID) injection 1 mg (1 mg Intravenous Given 04/15/14 0442)  ondansetron (ZOFRAN) injection 4 mg (4 mg Intravenous Given 04/15/14 0442)  tamsulosin (FLOMAX) capsule 0.4 mg (0.4 mg Oral Given 04/15/14 0700)  fentaNYL (SUBLIMAZE) injection 100 mcg (100 mcg Intravenous Given 04/15/14 0700)  fentaNYL (SUBLIMAZE) injection 100 mcg (100 mcg Intravenous Given 04/15/14 0805)  ciprofloxacin (CIPRO) IVPB 400 mg (400 mg Intravenous Given 04/16/14 1004)    Patient Vitals for the past 24 hrs:  BP Temp Temp src Pulse Resp SpO2  04/18/14 1305 114/69 mmHg 98.1 F (36.7 C) Oral 79 18 93 %  04/18/14 1236 - - - - - 91 %  04/18/14 1235 - - - - - 86 %  04/18/14 1006 115/62 mmHg - - 79 - 92 %  04/18/14 0443 124/73 mmHg 97.5 F (36.4 C) Axillary 68 16 95 %  04/18/14 0013 - - - 74 16 -  04/17/14 2012 120/72 mmHg 98.2 F (36.8 C) Oral 78 16 95 %    6:02 AM Reevaluation with update and discussion. After initial assessment and treatment, an updated evaluation reveals he, states his pain is improved to 8/10. Findings discussed with the patient and wife, all questions answered. Carleen Rhue L   07:10- per nursing, the patient had  several episodes of hypoxia. He was placed on O2 per [place] and O2 sat is now, 90%. CXR ordered.  Case discussed with his PCP. Service, they will admit him for hypoxia, associated with narcotic therapy for his left ureteral colic.  Labs Review Labs Reviewed  URINALYSIS, ROUTINE W REFLEX MICROSCOPIC - Abnormal; Notable for the following:    APPearance CLOUDY (*)    Hgb urine dipstick LARGE (*)    Ketones, ur 15 (*)    All other components within normal limits  CBC WITH DIFFERENTIAL - Abnormal; Notable for the following:    RBC 3.97 (*)    Hemoglobin 12.8 (*)    HCT 37.0 (*)    All other components within normal limits  BASIC METABOLIC PANEL - Abnormal; Notable for the following:    Glucose, Bld 144 (*)    BUN 27 (*)  GFR calc non Af Amer 63 (*)    GFR calc Af  Amer 73 (*)    Anion gap 17 (*)    All other components within normal limits  HEMOGLOBIN A1C - Abnormal; Notable for the following:    Hemoglobin A1C 7.0 (*)    Mean Plasma Glucose 154 (*)    All other components within normal limits  BASIC METABOLIC PANEL - Abnormal; Notable for the following:    Glucose, Bld 165 (*)    BUN 24 (*)    Creatinine, Ser 1.64 (*)    GFR calc non Af Amer 41 (*)    GFR calc Af Amer 47 (*)    All other components within normal limits  CBC - Abnormal; Notable for the following:    RBC 3.54 (*)    Hemoglobin 11.4 (*)    HCT 33.2 (*)    All other components within normal limits  GLUCOSE, CAPILLARY - Abnormal; Notable for the following:    Glucose-Capillary 109 (*)    All other components within normal limits  GLUCOSE, CAPILLARY - Abnormal; Notable for the following:    Glucose-Capillary 123 (*)    All other components within normal limits  GLUCOSE, CAPILLARY - Abnormal; Notable for the following:    Glucose-Capillary 143 (*)    All other components within normal limits  BASIC METABOLIC PANEL - Abnormal; Notable for the following:    Glucose, Bld 150 (*)    BUN 25 (*)    GFR calc non Af Amer 60 (*)    GFR calc Af Amer 70 (*)    All other components within normal limits  GLUCOSE, CAPILLARY - Abnormal; Notable for the following:    Glucose-Capillary 246 (*)    All other components within normal limits  GLUCOSE, CAPILLARY - Abnormal; Notable for the following:    Glucose-Capillary 155 (*)    All other components within normal limits  GLUCOSE, CAPILLARY - Abnormal; Notable for the following:    Glucose-Capillary 142 (*)    All other components within normal limits  GLUCOSE, CAPILLARY - Abnormal; Notable for the following:    Glucose-Capillary 156 (*)    All other components within normal limits  GLUCOSE, CAPILLARY - Abnormal; Notable for the following:    Glucose-Capillary 146 (*)    All other components within normal limits  GLUCOSE, CAPILLARY -  Abnormal; Notable for the following:    Glucose-Capillary 151 (*)    All other components within normal limits  GLUCOSE, CAPILLARY - Abnormal; Notable for the following:    Glucose-Capillary 143 (*)    All other components within normal limits  GLUCOSE, CAPILLARY - Abnormal; Notable for the following:    Glucose-Capillary 158 (*)    All other components within normal limits  GLUCOSE, CAPILLARY - Abnormal; Notable for the following:    Glucose-Capillary 109 (*)    All other components within normal limits  GLUCOSE, CAPILLARY - Abnormal; Notable for the following:    Glucose-Capillary 119 (*)    All other components within normal limits  GLUCOSE, CAPILLARY - Abnormal; Notable for the following:    Glucose-Capillary 113 (*)    All other components within normal limits  BASIC METABOLIC PANEL - Abnormal; Notable for the following:    Glucose, Bld 110 (*)    BUN 25 (*)    GFR calc non Af Amer 69 (*)    GFR calc Af Amer 80 (*)    All other components within normal limits  GLUCOSE,  CAPILLARY - Abnormal; Notable for the following:    Glucose-Capillary 110 (*)    All other components within normal limits  SURGICAL PCR SCREEN  URINE MICROSCOPIC-ADD ON  URIC ACID    Imaging Review No results found.   EKG Interpretation None      MDM   Final diagnoses:  Ureteral colic  Ureteral stone  Hypoxia   Fairly large left ureteral stone, requiring multiple doses of narcotic analgesia, which caused hypoxia. Patient will require admission for treatment, and stabilization, in an observed setting  Nursing Notes Reviewed/ Care Coordinated Applicable Imaging Reviewed Interpretation of Laboratory Data incorporated into ED treatment  Plan: Admit   Richarda Blade, MD 04/18/14 1554

## 2014-04-15 NOTE — Consult Note (Signed)
Subjective: I was asked to see [name] by Dr. Mingo Amber for a 62m left proximal ureteral stone with obstruction.  He had the onset of pain about 2 days ago.   The pain was severe but is controlled with morphine.  He was admitted because of respiratory depression with the narcotic.  He had no hematuria.   He has some nausea without vomiting.   He had no voiding complaints.  He was told he had a stone a couple of years ago by Dr. PFlorene GlenHe has had no GU surgery.  He sees Dr. RPhilis Fendtfor ED but he has no other GU problems other than a left orchiectomy for an undescended testicle as a child. .    ROS:  Review of Systems  Constitutional: Negative for fever and chills.  Respiratory: Negative for shortness of breath.   Cardiovascular: Negative for chest pain and leg swelling.  Gastrointestinal: Positive for nausea.  Genitourinary: Positive for flank pain. Negative for urgency and hematuria.  Neurological: Positive for tingling (both hands with numbness).  All other systems reviewed and are negative.  Allergies  Allergen Reactions  . Cephalexin     Unknown  . Doxycycline     Unknown  . Keflex [Cephalexin]   . Lac Bovis Other (See Comments)    lactose intolerant  . Pentazocine Lactate     He passed out- he had a seizure.  This occurred around 2000    Past Medical History  Diagnosis Date  . Coronary artery disease     a. Low level exercise Lex MV 3/14: low risk, EF 62%, inf defect 2/2 diaph attenuation vs artifiact, small area of scar possible, no ischemia  . Hyperlipidemia   . Hypertension   . History of seizure disorder   . History of atrial flutter   . Depression   . Back pain     persistent  . Hearing loss   . Diverticulitis   . H/O alcohol abuse   . Diabetes mellitus     controled by diet  . GERD (gastroesophageal reflux disease)   . Seizures   . Headache(784.0)   . Arthritis     of spine  . Sleep apnea     does not use CPAP  . Hx of echocardiogram     Echo 7/14:   Mod LVH, EF 55-60%, Gr 1 DD, mild MR, PASP 36  . AKI (acute kidney injury)     a. 6/14: resolved after d/c ARB;  b. RA U/S 7/14: no RA stenosis  . Atrial flutter   . Stroke   . Shortness of breath   . Renal stone 04/15/2014    Past Surgical History  Procedure Laterality Date  . Partial colectomy      for diverticuli  . Orchiectomy    . Umbilical hernia repair    . Cervical fusion    . Coronary artery bypass graft  2005    LIMA graft to LAD,saphenous vein graft to diag.,circumflex, marginal,and to the RCA  . Cardiac catheterization    . Appendectomy    . Esophagogastroduodenoscopy  02/10/2012    Procedure: ESOPHAGOGASTRODUODENOSCOPY (EGD);  Surgeon: RCleotis Nipper MD;  [facility]  Service: Endoscopy;  Laterality: N/A;  . Foreign body removal  02/10/2012    Procedure: FOREIGN BODY REMOVAL;  Surgeon: RCleotis Nipper MD;  [place]  Service: Endoscopy;  Laterality: N/A;  . Back surgery    . Esophagogastroduodenoscopy  06/29/2012  Procedure: ESOPHAGOGASTRODUODENOSCOPY (EGD);  Surgeon: Winfield Cunas., MD;  Location: [facility];  Service: Endoscopy;  Laterality: N/A;  . Foreign body removal  06/29/2012    Procedure: FOREIGN BODY REMOVAL;  Surgeon: Winfield Cunas., MD;  Location: [facility] ENDOSCOPY;  Service: Endoscopy;  Laterality: N/A;  . Savory dilation  06/29/2012    Procedure: SAVORY DILATION;  Surgeon: Winfield Cunas., MD;  Location: [facility]OSCOPY;  Service: Endoscopy;  Laterality: N/A;  . Tee without cardioversion N/A 11/17/2013    Procedure: TRANSESOPHAGEAL ECHOCARDIOGRAM (TEE);  Surgeon: Larey Dresser, MD;  Location: [facility] ENDOSCOPY;  Service: Cardiovascular;  Laterality: N/A;    History   Social History  . Marital Status: Married    Spouse Name: [name]    Number of Children: 2  . Years of Education: college   Occupational History  . 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     Social History Main Topics  . Smoking status: Never  Smoker   . Smokeless tobacco: Never Used  . Alcohol Use: No     Comment: rare beer  . Drug Use: No  . Sexual Activity: Not Currently   Other Topics Concern  . Not on file   Social History Narrative   Patient lives at home with  wife      Patient drinks 2 cups of coffee a day.patient is left handed    Family History  Problem Relation Age of Onset  . Emphysema Mother     was a smoker  . Asthma Mother   . Heart disease Father   . Prostate cancer Paternal Grandfather   . Pancreatic cancer Paternal Uncle     Anti-infectives: Anti-infectives   None      Current Facility-Administered Medications  Medication Dose Route Frequency Provider Last Rate Last Dose  . 0.9 %  sodium chloride infusion   Intravenous STAT Richarda Blade, MD      . aspirin chewable tablet 81 mg  81 mg Oral Daily Timmothy Euler, MD   81 mg at 04/15/14 1158  . atorvastatin (LIPITOR) tablet 80 mg  80 mg Oral q1800 Timmothy Euler, MD   80 mg at 04/15/14 1757  . diphenhydrAMINE (BENADRYL) injection 12.5 mg  12.5 mg Intravenous Q6H PRN Bernadene Bell, MD       Or  . diphenhydrAMINE (BENADRYL) 12.5 MG/5ML elixir 12.5 mg  12.5 mg Oral Q6H PRN Bernadene Bell, MD      . FLUoxetine (PROZAC) capsule 20 mg  20 mg Oral Daily Timmothy Euler, MD   20 mg at 04/15/14 1441  . heparin injection 5,000 Units  5,000 Units Subcutaneous 3 times per day Timmothy Euler, MD   5,000 Units at 04/15/14 2211  . insulin aspart (novoLOG) injection 0-9 Units  0-9 Units Subcutaneous TID WC Timmothy Euler, MD      . morphine 1 MG/ML PCA injection   Intravenous 6 times per day Bernadene Bell, MD   11 mg at 04/15/14 2011  . naloxone [facility]) injection 0.4 mg  0.4 mg Intravenous PRN Bernadene Bell, MD       And  . sodium chloride 0.9 % injection 9 mL  9 mL Intravenous PRN Bernadene Bell, MD      . nebivolol (BYSTOLIC) tablet 10 mg  10 mg Oral BID Timmothy Euler, MD   10 mg at 04/15/14 2211  . ondansetron (ZOFRAN) injection  4  mg  4 mg Intravenous Q6H PRN Bernadene Bell, MD      . pantoprazole (PROTONIX) EC tablet 40 mg  40 mg Oral Daily Timmothy Euler, MD   40 mg at 04/15/14 1158  . sodium chloride 0.9 % injection 3 mL  3 mL Intravenous Q12H Timmothy Euler, MD   3 mL at 04/15/14 1159  . traZODone (DESYREL) tablet 150 mg  150 mg Oral QHS Timmothy Euler, MD   150 mg at 04/15/14 2211     Objective: Vital signs in last 24 hours: Temp:  [97.7 F (36.5 C)-98.7 F (37.1 C)] 97.9 F (36.6 C) (08/21 2052) Pulse Rate:  [75-87] 85 (08/21 2052) Resp:  [10-22] 10 (08/21 2052) BP: (118-158)/(60-88) 118/60 mmHg (08/21 2052) SpO2:  [79 %-100 %] 92 % (08/21 2052) Weight:  [97.569 kg (215 lb 1.6 oz)] 97.569 kg (215 lb 1.6 oz) (08/21 1010)  Intake/Output from previous day:   Intake/Output this shift: Total I/O In: -  Out: 200 [Urine:200]   Physical Exam  Constitutional: He is oriented to person, place, and time and well-developed, well-nourished, and in no distress.  HENT:  Head: Normocephalic and atraumatic.  Neck: Normal range of motion. Neck supple. No thyromegaly present.  Cardiovascular: Normal rate, regular rhythm and normal heart sounds.   Pulmonary/Chest: Effort normal and breath sounds normal. No respiratory distress.  Abdominal: Soft. Bowel sounds are normal. There is tenderness (left flank).  Musculoskeletal: Normal range of motion. He exhibits no edema and no tenderness.  Neurological: He is alert and oriented to person, place, and time.  Skin: Skin is warm and dry.  Psychiatric: Mood and affect normal.    Lab Results:   Recent Labs  04/15/14 0429  WBC 8.2  HGB 12.8*  HCT 37.0*  PLT 203   BMET  Recent Labs  04/15/14 0429  NA 139  K 3.9  CL 101  CO2 21  GLUCOSE 144*  BUN 27*  CREATININE 1.14  CALCIUM 9.2   PT/INR No results found for this basename: LABPROT, INR,  in the last 72 hours ABG No results found for this basename: PHART, PCO2, PO2, HCO3,  in the last 72  hours  Studies/Results: Dg Chest Port 1 View  04/15/2014   CLINICAL DATA:  Shortness of breath.  Hypoxia.  EXAM: PORTABLE CHEST - 1 VIEW  COMPARISON:  11/14/2013  FINDINGS: Heart size and pulmonary vascularity are normal. Minimal scarring at the left lung base. Lungs are otherwise clear. No effusions. CABG. No acute osseous abnormality.  IMPRESSION: No acute disease.   Electronically Signed   By: Rozetta Nunnery M.D.   On: 04/15/2014 08:04   Ct Renal Stone Study  04/15/2014   CLINICAL DATA:  Left flank pain  EXAM: CT RENAL STONE PROTOCOL  TECHNIQUE: Multidetector CT imaging of the abdomen and pelvis was performed following the standard protocol without intravenous contrast  COMPARISON:  09/28/2012  FINDINGS: BODY WALL: Unremarkable.  LOWER CHEST: Unremarkable.  ABDOMEN/PELVIS:  Liver: Patchy fatty infiltration the liver.  Biliary: No evidence of biliary obstruction or stone.  Pancreas: Unremarkable.  Spleen: Unremarkable.  Adrenals: Unremarkable.  Kidneys and ureters: Left hydronephrosis and proximal hydroureter and left renal expansion with asymmetric perinephric edema secondary to a 6 mm stone in the mid left ureter. Nonobstructive 3 mm calculus in the upper pole left kidney. No right-sided urolithiasis.  Bladder: Unremarkable.  Reproductive: There is a penile pump with resevoir in the right lower quadrant.  Bowel: Partial colectomy with  bowel anastomosis in the central pelvis. No bowel obstruction. No evidence of bowel inflammation.  Retroperitoneum: No mass or adenopathy.  Peritoneum: No ascites or pneumoperitoneum.  Vascular: No acute abnormality.  OSSEOUS: L5-S1 posterior lumbar interbody fusion with rod and pedicle screw fixation. There is no definitive fusion across the disc level. This accounts for lucencies/loosening around the upper pedicle screws.  IMPRESSION: 1. Obstructing 6 mm stone in the mid left ureter. 2. Nonobstructive left nephrolithiasis. 3. Chronic findings are stable from prior and noted  above.   Electronically Signed   By: Jorje Guild M.D.   On: 04/15/2014 05:24   I have reviewed his labs and CT films and discussed the case with the ER physician.    Assessment: He has a symptomatic left mid ureteral stone and would like to have it removed.   Plan: I am going to post him for a left ureteroscopic stone extraction at [facility] tomorrow.  He will need to be transferred by [facility]. I have reviewed the risks of bleeding, infection, ureteral injury, need for a stent or secondary procedures, thrombotic events and anesthetic risks.   CC: Dr. Mervyn Gay    LOS: 0 days    Malka So 04/15/2014

## 2014-04-15 NOTE — Progress Notes (Signed)
Wasted 91m of fentyl with PTanda Rockers RN in the sink. DGlade Nurse RN

## 2014-04-16 ENCOUNTER — Inpatient Hospital Stay: Admit: 2014-04-16 | Payer: Self-pay | Admitting: Urology

## 2014-04-16 ENCOUNTER — Encounter (HOSPITAL_COMMUNITY)
Admission: EM | Disposition: A | Discharge status: Home / Self Care | Payer: Self-pay | Source: Home / Self Care | Attending: Family Medicine

## 2014-04-16 ENCOUNTER — Encounter (HOSPITAL_COMMUNITY): Payer: Medicare Other | Admitting: Registered Nurse

## 2014-04-16 ENCOUNTER — Inpatient Hospital Stay (HOSPITAL_COMMUNITY): Payer: Medicare Other | Admitting: Registered Nurse

## 2014-04-16 ENCOUNTER — Encounter (HOSPITAL_COMMUNITY): Payer: Self-pay | Admitting: Registered Nurse

## 2014-04-16 ENCOUNTER — Ambulatory Visit (HOSPITAL_COMMUNITY)
Admission: EM | Admit: 2014-04-16 | Discharge: 2014-04-16 | Disposition: A | Discharge status: Home / Self Care | Payer: Medicare Other | Source: Home / Self Care | Attending: Urology | Admitting: Urology

## 2014-04-16 DIAGNOSIS — E119 Type 2 diabetes mellitus without complications: Secondary | ICD-10-CM | POA: Insufficient documentation

## 2014-04-16 DIAGNOSIS — N201 Calculus of ureter: Secondary | ICD-10-CM

## 2014-04-16 HISTORY — PX: CYSTOSCOPY/RETROGRADE/URETEROSCOPY/STONE EXTRACTION WITH BASKET: SHX5317

## 2014-04-16 LAB — CBC
HCT: 33.2 % — ABNORMAL LOW (ref 39.0–52.0)
HEMOGLOBIN: 11.4 g/dL — AB (ref 13.0–17.0)
MCH: 32.2 pg (ref 26.0–34.0)
MCHC: 34.3 g/dL (ref 30.0–36.0)
MCV: 93.8 fL (ref 78.0–100.0)
Platelets: 202 10*3/uL (ref 150–400)
RBC: 3.54 MIL/uL — ABNORMAL LOW (ref 4.22–5.81)
RDW: 14.4 % (ref 11.5–15.5)
WBC: 10.3 10*3/uL (ref 4.0–10.5)

## 2014-04-16 LAB — BASIC METABOLIC PANEL
ANION GAP: 14 (ref 5–15)
BUN: 24 mg/dL — ABNORMAL HIGH (ref 6–23)
CALCIUM: 8.8 mg/dL (ref 8.4–10.5)
CHLORIDE: 99 meq/L (ref 96–112)
CO2: 25 mEq/L (ref 19–32)
CREATININE: 1.64 mg/dL — AB (ref 0.50–1.35)
GFR calc non Af Amer: 41 mL/min — ABNORMAL LOW (ref >90)
GFR, EST AFRICAN AMERICAN: 47 mL/min — AB (ref >90)
Glucose, Bld: 165 mg/dL — ABNORMAL HIGH (ref 70–99)
Potassium: 4.2 mEq/L (ref 3.7–5.3)
SODIUM: 138 meq/L (ref 137–147)

## 2014-04-16 LAB — GLUCOSE, CAPILLARY
Glucose-Capillary: 143 mg/dL — ABNORMAL HIGH (ref 70–99)
Glucose-Capillary: 246 mg/dL — ABNORMAL HIGH (ref 70–99)
Glucose-Capillary: 155 mg/dL — ABNORMAL HIGH (ref 70–99)

## 2014-04-16 LAB — SURGICAL PCR SCREEN
MRSA, PCR: NEGATIVE
Staphylococcus aureus: NEGATIVE

## 2014-04-16 SURGERY — CYSTOSCOPY, WITH CALCULUS REMOVAL USING BASKET
Anesthesia: General | Site: Ureter | Laterality: Left

## 2014-04-16 SURGERY — CYSTOURETEROSCOPY, WITH RETROGRADE PYELOGRAM AND STENT INSERTION
Anesthesia: Choice | Laterality: Left

## 2014-04-16 MED ORDER — LACTATED RINGERS IV SOLN
INTRAVENOUS | Status: DC | PRN
  Administered 2014-04-16: 10:00:00 via INTRAVENOUS

## 2014-04-16 MED ORDER — DEXAMETHASONE SODIUM PHOSPHATE 10 MG/ML IJ SOLN
INTRAMUSCULAR | Status: DC | PRN
  Administered 2014-04-16: 4 mg via INTRAVENOUS

## 2014-04-16 MED ORDER — PHENYLEPHRINE HCL 10 MG/ML IJ SOLN
INTRAMUSCULAR | Status: DC | PRN
  Administered 2014-04-16 (×2): 80 ug via INTRAVENOUS

## 2014-04-16 MED ORDER — IOHEXOL 300 MG/ML  SOLN
INTRAMUSCULAR | Status: DC | PRN
  Administered 2014-04-16: 5 mL

## 2014-04-16 MED ORDER — HYDROCODONE-ACETAMINOPHEN 5-325 MG PO TABS: 1.0000 | ORAL_TABLET | Freq: Four times a day (QID) | ORAL | Status: DC | PRN

## 2014-04-16 MED ORDER — MEPERIDINE HCL 25 MG/ML IJ SOLN: 6.2500 mg | INTRAMUSCULAR | Status: DC | PRN

## 2014-04-16 MED ORDER — LIDOCAINE HCL (CARDIAC) 20 MG/ML IV SOLN
INTRAVENOUS | Status: DC | PRN
  Administered 2014-04-16: 100 mg via INTRAVENOUS

## 2014-04-16 MED ORDER — SODIUM CHLORIDE 0.9 % IR SOLN
Status: DC | PRN
  Administered 2014-04-16: 1000 mL
  Administered 2014-04-16: 3000 mL

## 2014-04-16 MED ORDER — FENTANYL CITRATE 0.05 MG/ML IJ SOLN: 25.0000 ug | INTRAMUSCULAR | Status: DC | PRN

## 2014-04-16 MED ORDER — ONDANSETRON HCL 4 MG PO TABS: 4.0000 mg | ORAL_TABLET | Freq: Three times a day (TID) | ORAL | Status: DC | PRN

## 2014-04-16 MED ORDER — PHENAZOPYRIDINE HCL 200 MG PO TABS
200.0000 mg | ORAL_TABLET | Freq: Three times a day (TID) | ORAL | Status: DC | PRN
  Administered 2014-04-16 – 2014-04-17 (×2): 200 mg via ORAL
  Filled 2014-04-16 (×3): qty 1

## 2014-04-16 MED ORDER — ONDANSETRON HCL 4 MG/2ML IJ SOLN
INTRAMUSCULAR | Status: DC | PRN
  Administered 2014-04-16: 4 mg via INTRAVENOUS

## 2014-04-16 MED ORDER — PHENYLEPHRINE HCL 10 MG/ML IJ SOLN
20.0000 mg | INTRAVENOUS | Status: DC | PRN
  Administered 2014-04-16: 20 ug/min via INTRAVENOUS

## 2014-04-16 MED ORDER — EPHEDRINE SULFATE 50 MG/ML IJ SOLN
INTRAMUSCULAR | Status: DC | PRN
  Administered 2014-04-16: 5 mg via INTRAVENOUS

## 2014-04-16 MED ORDER — PHENAZOPYRIDINE HCL 200 MG PO TABS: 200.0000 mg | ORAL_TABLET | Freq: Three times a day (TID) | ORAL | Status: DC | PRN

## 2014-04-16 MED ORDER — BELLADONNA ALKALOIDS-OPIUM 16.2-60 MG RE SUPP
RECTAL | Status: DC | PRN
  Administered 2014-04-16: 1 via RECTAL

## 2014-04-16 MED ORDER — SODIUM CHLORIDE 0.45 % IV SOLN
INTRAVENOUS | Status: AC
  Administered 2014-04-16: 07:00:00 via INTRAVENOUS

## 2014-04-16 MED ORDER — PROPOFOL 10 MG/ML IV BOLUS
INTRAVENOUS | Status: DC | PRN
  Administered 2014-04-16: 170 mg via INTRAVENOUS

## 2014-04-16 MED ORDER — FENTANYL CITRATE 0.05 MG/ML IJ SOLN
INTRAMUSCULAR | Status: DC | PRN
  Administered 2014-04-16: 25 ug via INTRAVENOUS

## 2014-04-16 SURGICAL SUPPLY — 23 items
BAG URO CATCHER STRL LF (DRAPE) ×3 IMPLANT
BASKET LASER NITINOL 1.9FR (BASKET) ×3 IMPLANT
BASKET STONE NCOMPASS (UROLOGICAL SUPPLIES) IMPLANT
CATH URET 5FR 28IN OPEN ENDED (CATHETERS) ×3 IMPLANT
CLOTH BEACON ORANGE TIMEOUT ST (SAFETY) ×3 IMPLANT
DRAPE CAMERA CLOSED 9X96 (DRAPES) ×3 IMPLANT
EXTRACTOR STONE NITINOL NGAGE (UROLOGICAL SUPPLIES) IMPLANT
FIBER LASER FLEXIVA 365 (UROLOGICAL SUPPLIES) ×3 IMPLANT
FIBER LASER TRAC TIP (UROLOGICAL SUPPLIES) ×3 IMPLANT
GLOVE SURG SS PI 8.0 STRL IVOR (GLOVE) IMPLANT
GOWN STRL REUS W/TWL XL LVL3 (GOWN DISPOSABLE) ×3 IMPLANT
GUIDEWIRE STR DUAL SENSOR (WIRE) ×3 IMPLANT
IV NS 1000ML (IV SOLUTION) ×2
IV NS 1000ML BAXH (IV SOLUTION) ×1 IMPLANT
IV NS IRRIG 3000ML ARTHROMATIC (IV SOLUTION) ×3 IMPLANT
MANIFOLD NEPTUNE II (INSTRUMENTS) ×3 IMPLANT
PACK CYSTO (CUSTOM PROCEDURE TRAY) ×3 IMPLANT
SHEATH ACCESS URETERAL 38CM (SHEATH) ×6 IMPLANT
SHEATH URET ACCESS 10/12FR (MISCELLANEOUS) IMPLANT
SHIELD EYE LENSE ONLY DISP (MISCELLANEOUS) ×3 IMPLANT
STENT CONTOUR 6FRX26X.038 (STENTS) ×3 IMPLANT
TUBING CONNECTING 10 (TUBING) ×2 IMPLANT
TUBING CONNECTING 10' (TUBING) ×1

## 2014-04-16 NOTE — Transfer of Care (Signed)
Immediate Anesthesia Transfer of Care Note  Patient: [name]  Procedure(s) Performed: Procedure(s): CYSTOSCOPY/RETROGRADE/LEFT URETEROSCOPY/STONE EXTRACTION WITH BASKET AND LASER LITHOTRIPSY (Left)  Patient Location: PACU  Anesthesia Type:General  Level of Consciousness: awake, alert , oriented and patient cooperative  Airway & Oxygen Therapy: Patient Spontanous Breathing and Patient connected to face mask oxygen  Post-op Assessment: Report given to PACU RN, Post -op Vital signs reviewed and stable and Patient moving all extremities  Post vital signs: Reviewed and stable  Complications: No apparent anesthesia complications

## 2014-04-16 NOTE — Anesthesia Postprocedure Evaluation (Signed)
  Anesthesia Post-op Note  Anesthesia Post Note  Patient: [name]  Procedure(s) Performed: Procedure(s) (LRB): CYSTOSCOPY/RETROGRADE/LEFT URETEROSCOPY/STONE EXTRACTION WITH BASKET AND LASER LITHOTRIPSY (Left)  Anesthesia type: General  Patient location: PACU  Post pain: Pain level controlled  Post assessment: Post-op Vital signs reviewed  Last Vitals:  Filed Vitals:   04/16/14 1136  BP:   Pulse: 74  Temp: 36.8 C  Resp:     Post vital signs: Reviewed  Level of consciousness: awake.  Off BiPAP which was initiated on arrival to PACU.  Stable for transfer by [facility] back to [facility].  Complications: No apparent anesthesia complications

## 2014-04-16 NOTE — Progress Notes (Signed)
Pt placed on BIPAP in PACU.

## 2014-04-16 NOTE — Op Note (Signed)
NAMEARKEL, CARTWRIGHT NO.:  [id number]  MEDICAL RECORD NO.:  [id number]  LOCATION:  [facility]                         FACILITY:  [facility]  PHYSICIAN:  Marshall Cork. Jeffie Pollock, M.D.    DATE OF BIRTH:  June 11, 1971  DATE OF PROCEDURE:  04/16/2014 DATE OF DISCHARGE:                              OPERATIVE REPORT   PROCEDURE:  Cystoscopy with left retrograde pyelogram with interpretation, left ureteroscopy with Holmium laser lithotripsy and stone extraction, left double-J stent insertion.  PREOPERATIVE DIAGNOSIS:  Left mid ureteral stone.  POSTOPERATIVE DIAGNOSIS:  Left distal ureteral stone.  SURGEON:  Marshall Cork. Jeffie Pollock, M.D.  ANESTHESIA:  General.  SPECIMEN:  Stone fragments.  DRAINS:  6-French, 26 cm left double-J stent.  BLOOD LOSS:  Minimal.  COMPLICATIONS:  None.  INDICATIONS:  Mr. Bail 68 year old [place] male, who was admitted to [facility] yesterday with a 5.5 mm left mid ureteral stone.  He had respiratory depression with pain medicine which required admission.  He has continued to have pain overnight and is elected to undergo ureteroscopy today.  FINDINGS AND PROCEDURE:  He was ____ from [facility] to [facility], he is taken to the operating room where he was given Cipro.  A general anesthetic was induced.  He was fitted with PAS hose.  He was placed in lithotomy position.  His perineum and genitalia were prepped with Betadine solution.  He was draped in usual sterile fashion.  B and O suppository was placed.  Cystoscopy was performed with a 22- [place] scope and 12-degree lens.  Examination revealed a normal urethra. The external sphincter was intact.  The prostatic urethra was short without significant obstruction.  Examination of bladder revealed mild trabeculation.  No tumors or inflammatory lesions were noted.  There was some uric acid crystal material at the base of the bladder.  Ureteral orifices were unremarkable.  The left ureteral orifice was cannulated with  5-French open-ended catheter and contrast was instilled.  This revealed a filling defect approximately 4 cm proximal to the meatus, and the distal ureter consistent with a stone.  There was some proximal dilation, and no other filling defects.  Once retrograde pyelography was completed, a guidewire was passed to the kidney and the cystoscope was removed.  The 6.5-French short rigid ureteroscope was then passed alongside the wire.  I was able negotiate up the ureter without dilation.  The stone was identified and appeared to be consistent with a uric acid stone. Initially, I attempted basket extraction with a Nitinol basket, but it would not come through the intramural ureter.  I then disengaged the basket and fragmented the stone with 365 micron Holmium laser fiber set on 0.5 watts and 20 hertz.  Once the stone was adequately fragmented, the fragments were removed with the Nitinol basket to the bladder.  Ureteroscopic inspection revealed no residual significant fragments.  The ureteroscope was removed.  The cystoscope was reinserted over the wire.  A 6-French 26-cm double-J stent with string was inserted to the kidney under fluoroscopic guidance.  The wire was removed leaving good coil in the kidney and good coil in the bladder.  At this point, the stone fragments were evacuated  from the bladder.  The bladder was drained and the cystoscope was removed leaving the stent string exiting urethra.  The stone fragments were collected to be given to the family to bring to the office for analysis.  The stent string was secured to the patient's penis.  He was taken down from lithotomy position.  His anesthetic was reversed.  He was moved to recovery in stable condition.  He will be transferred back to [facility] for further disposition by the hospitalist service.     Marshall Cork. Jeffie Pollock, M.D.     JJW/MEDQ  D:  04/16/2014  T:  04/16/2014  Job:  [id number]

## 2014-04-16 NOTE — Interval H&P Note (Signed)
History and Physical Interval Note:  He continues to have left flank pain and hasn't passed the stone.   04/16/2014 9:35 AM  [name]  has presented today for surgery, with the diagnosis of Left ureteral stone obstruction  The various methods of treatment have been discussed with the patient and family. After consideration of risks, benefits and other options for treatment, the patient has consented to  Procedure(s): CYSTOSCOPY/RETROGRADE/URETEROSCOPY/STONE EXTRACTION WITH BASKET (Left) as a surgical intervention .  The patient's history has been reviewed, patient examined, no change in status, stable for surgery.  I have reviewed the patient's chart and labs.  Questions were answered to the patient's satisfaction.     Kory Panjwani J

## 2014-04-16 NOTE — Progress Notes (Signed)
[facility] called to pick patient up to transfer to [facility] for procedure. Patient disconnected from PCA pump. Patient on 2L O2. [facility] requested medical necessity form to be printed but left before we could figure out how to print.

## 2014-04-16 NOTE — H&P (View-Only) (Signed)
Subjective: I was asked to see [name] by Dr. Mingo Amber for a 31m left proximal ureteral stone with obstruction.  He had the onset of pain about 2 days ago.   The pain was severe but is controlled with morphine.  He was admitted because of respiratory depression with the narcotic.  He had no hematuria.   He has some nausea without vomiting.   He had no voiding complaints.  He was told he had a stone a couple of years ago by Dr. PFlorene GlenHe has had no GU surgery.  He sees Dr. RPhilis Fendtfor ED but he has no other GU problems other than a left orchiectomy for an undescended testicle as a child. .    ROS:  Review of Systems  Constitutional: Negative for fever and chills.  Respiratory: Negative for shortness of breath.   Cardiovascular: Negative for chest pain and leg swelling.  Gastrointestinal: Positive for nausea.  Genitourinary: Positive for flank pain. Negative for urgency and hematuria.  Neurological: Positive for tingling (both hands with numbness).  All other systems reviewed and are negative.  Allergies  Allergen Reactions  . Cephalexin     Unknown  . Doxycycline     Unknown  . Keflex [Cephalexin]   . Lac Bovis Other (See Comments)    lactose intolerant  . Pentazocine Lactate     He passed out- he had a seizure.  This occurred around 2000    Past Medical History  Diagnosis Date  . Coronary artery disease     a. Low level exercise Lex MV 3/14: low risk, EF 62%, inf defect 2/2 diaph attenuation vs artifiact, small area of scar possible, no ischemia  . Hyperlipidemia   . Hypertension   . History of seizure disorder   . History of atrial flutter   . Depression   . Back pain     persistent  . Hearing loss   . Diverticulitis   . H/O alcohol abuse   . Diabetes mellitus     controled by diet  . GERD (gastroesophageal reflux disease)   . Seizures   . Headache(784.0)   . Arthritis     of spine  . Sleep apnea     does not use CPAP  . Hx of echocardiogram     Echo 7/14:   Mod LVH, EF 55-60%, Gr 1 DD, mild MR, PASP 36  . AKI (acute kidney injury)     a. 6/14: resolved after d/c ARB;  b. RA U/S 7/14: no RA stenosis  . Atrial flutter   . Stroke   . Shortness of breath   . Renal stone 04/15/2014    Past Surgical History  Procedure Laterality Date  . Partial colectomy      for diverticuli  . Orchiectomy    . Umbilical hernia repair    . Cervical fusion    . Coronary artery bypass graft  2005    LIMA graft to LAD,saphenous vein graft to diag.,circumflex, marginal,and to the RCA  . Cardiac catheterization    . Appendectomy    . Esophagogastroduodenoscopy  02/10/2012    Procedure: ESOPHAGOGASTRODUODENOSCOPY (EGD);  Surgeon: RCleotis Nipper MD;  [facility]  Service: Endoscopy;  Laterality: N/A;  . Foreign body removal  02/10/2012    Procedure: FOREIGN BODY REMOVAL;  Surgeon: RCleotis Nipper MD;  [place]  Service: Endoscopy;  Laterality: N/A;  . Back surgery    . Esophagogastroduodenoscopy  06/29/2012  Procedure: ESOPHAGOGASTRODUODENOSCOPY (EGD);  Surgeon: Winfield Cunas., MD;  Location[facility];  Service: Endoscopy;  Laterality: N/A;  . Foreign body removal  06/29/2012    Procedure: FOREIGN BODY REMOVAL;  Surgeon: Winfield Cunas., MD;  Location: [facility] ENDOSCOPY;  Service: Endoscopy;  Laterality: N/A;  . Savory dilation  06/29/2012    Procedure: SAVORY DILATION;  Surgeon: Winfield Cunas., MD;  Location: [facility];  Service: Endoscopy;  Laterality: N/A;  . Tee without cardioversion N/A 11/17/2013    Procedure: TRANSESOPHAGEAL ECHOCARDIOGRAM (TEE);  Surgeon: Larey [facility]er, MD;  Location: [facility] ENDOSCOPY;  Service: Cardiovascular;  Laterality: N/A;    History   Social History  . Marital Status: Married    Spouse Name: [name]    Number of Children: 2  . Years of Education: college   Occupational History  . 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     Social History Main Topics  . Smoking status: Never  Smoker   . Smokeless tobacco: Never Used  . Alcohol Use: No     Comment: rare beer  . Drug Use: No  . Sexual Activity: Not Currently   Other Topics Concern  . Not on file   Social History Narrative   Patient lives at home with  wife      Patient drinks 2 cups of coffee a day.patient is left handed    Family History  Problem Relation Age of Onset  . Emphysema Mother     was a smoker  . Asthma Mother   . Heart disease Father   . Prostate cancer Paternal Grandfather   . Pancreatic cancer Paternal Uncle     Anti-infectives: Anti-infectives   None      Current Facility-Administered Medications  Medication Dose Route Frequency Provider Last Rate Last Dose  . 0.9 %  sodium chloride infusion   Intravenous STAT Richarda Blade, MD      . aspirin chewable tablet 81 mg  81 mg Oral Daily Timmothy Euler, MD   81 mg at 04/15/14 1158  . atorvastatin (LIPITOR) tablet 80 mg  80 mg Oral q1800 Timmothy Euler, MD   80 mg at 04/15/14 1757  . diphenhydrAMINE (BENADRYL) injection 12.5 mg  12.5 mg Intravenous Q6H PRN Bernadene Bell, MD       Or  . diphenhydrAMINE (BENADRYL) 12.5 MG/5ML elixir 12.5 mg  12.5 mg Oral Q6H PRN Bernadene Bell, MD      . FLUoxetine (PROZAC) capsule 20 mg  20 mg Oral Daily Timmothy Euler, MD   20 mg at 04/15/14 1441  . heparin injection 5,000 Units  5,000 Units Subcutaneous 3 times per day Timmothy Euler, MD   5,000 Units at 04/15/14 2211  . insulin aspart (novoLOG) injection 0-9 Units  0-9 Units Subcutaneous TID WC Timmothy Euler, MD      . morphine 1 MG/ML PCA injection   Intravenous 6 times per day Bernadene Bell, MD   11 mg at 04/15/14 2011  . naloxone [facility]) injection 0.4 mg  0.4 mg Intravenous PRN Bernadene Bell, MD       And  . sodium chloride 0.9 % injection 9 mL  9 mL Intravenous PRN Bernadene Bell, MD      . nebivolol (BYSTOLIC) tablet 10 mg  10 mg Oral BID Timmothy Euler, MD   10 mg at 04/15/14 2211  . ondansetron (ZOFRAN) injection  4  mg  4 mg Intravenous Q6H PRN Bernadene Bell, MD      . pantoprazole (PROTONIX) EC tablet 40 mg  40 mg Oral Daily Timmothy Euler, MD   40 mg at 04/15/14 1158  . sodium chloride 0.9 % injection 3 mL  3 mL Intravenous Q12H Timmothy Euler, MD   3 mL at 04/15/14 1159  . traZODone (DESYREL) tablet 150 mg  150 mg Oral QHS Timmothy Euler, MD   150 mg at 04/15/14 2211     Objective: Vital signs in last 24 hours: Temp:  [97.7 F (36.5 C)-98.7 F (37.1 C)] 97.9 F (36.6 C) (08/21 2052) Pulse Rate:  [75-87] 85 (08/21 2052) Resp:  [10-22] 10 (08/21 2052) BP: (118-158)/(60-88) 118/60 mmHg (08/21 2052) SpO2:  [79 %-100 %] 92 % (08/21 2052) Weight:  [97.569 kg (215 lb 1.6 oz)] 97.569 kg (215 lb 1.6 oz) (08/21 1010)  Intake/Output from previous day:   Intake/Output this shift: Total I/O In: -  Out: 200 [Urine:200]   Physical Exam  Constitutional: He is oriented to person, place, and time and well-developed, well-nourished, and in no distress.  HENT:  Head: Normocephalic and atraumatic.  Neck: Normal range of motion. Neck supple. No thyromegaly present.  Cardiovascular: Normal rate, regular rhythm and normal heart sounds.   Pulmonary/Chest: Effort normal and breath sounds normal. No respiratory distress.  Abdominal: Soft. Bowel sounds are normal. There is tenderness (left flank).  Musculoskeletal: Normal range of motion. He exhibits no edema and no tenderness.  Neurological: He is alert and oriented to person, place, and time.  Skin: Skin is warm and dry.  Psychiatric: Mood and affect normal.    Lab Results:   Recent Labs  04/15/14 0429  WBC 8.2  HGB 12.8*  HCT 37.0*  PLT 203   BMET  Recent Labs  04/15/14 0429  NA 139  K 3.9  CL 101  CO2 21  GLUCOSE 144*  BUN 27*  CREATININE 1.14  CALCIUM 9.2   PT/INR No results found for this basename: LABPROT, INR,  in the last 72 hours ABG No results found for this basename: PHART, PCO2, PO2, HCO3,  in the last 72  hours  Studies/Results: Dg Chest Port 1 View  04/15/2014   CLINICAL DATA:  Shortness of breath.  Hypoxia.  EXAM: PORTABLE CHEST - 1 VIEW  COMPARISON:  11/14/2013  FINDINGS: Heart size and pulmonary vascularity are normal. Minimal scarring at the left lung base. Lungs are otherwise clear. No effusions. CABG. No acute osseous abnormality.  IMPRESSION: No acute disease.   Electronically Signed   By: Rozetta Nunnery M.D.   On: 04/15/2014 08:04   Ct Renal Stone Study  04/15/2014   CLINICAL DATA:  Left flank pain  EXAM: CT RENAL STONE PROTOCOL  TECHNIQUE: Multidetector CT imaging of the abdomen and pelvis was performed following the standard protocol without intravenous contrast  COMPARISON:  09/28/2012  FINDINGS: BODY WALL: Unremarkable.  LOWER CHEST: Unremarkable.  ABDOMEN/PELVIS:  Liver: Patchy fatty infiltration the liver.  Biliary: No evidence of biliary obstruction or stone.  Pancreas: Unremarkable.  Spleen: Unremarkable.  Adrenals: Unremarkable.  Kidneys and ureters: Left hydronephrosis and proximal hydroureter and left renal expansion with asymmetric perinephric edema secondary to a 6 mm stone in the mid left ureter. Nonobstructive 3 mm calculus in the upper pole left kidney. No right-sided urolithiasis.  Bladder: Unremarkable.  Reproductive: There is a penile pump with resevoir in the right lower quadrant.  Bowel: Partial colectomy with  bowel anastomosis in the central pelvis. No bowel obstruction. No evidence of bowel inflammation.  Retroperitoneum: No mass or adenopathy.  Peritoneum: No ascites or pneumoperitoneum.  Vascular: No acute abnormality.  OSSEOUS: L5-S1 posterior lumbar interbody fusion with rod and pedicle screw fixation. There is no definitive fusion across the disc level. This accounts for lucencies/loosening around the upper pedicle screws.  IMPRESSION: 1. Obstructing 6 mm stone in the mid left ureter. 2. Nonobstructive left nephrolithiasis. 3. Chronic findings are stable from prior and noted  above.   Electronically Signed   By: Jorje Guild M.D.   On: 04/15/2014 05:24   I have reviewed his labs and CT films and discussed the case with the ER physician.    Assessment: He has a symptomatic left mid ureteral stone and would like to have it removed.   Plan: I am going to post him for a left ureteroscopic stone extraction at [facility] tomorrow.  He will need to be transferred by [facility]. I have reviewed the risks of bleeding, infection, ureteral injury, need for a stent or secondary procedures, thrombotic events and anesthetic risks.   CC: Dr. Mervyn Gay    LOS: 0 days    Malka So 04/15/2014

## 2014-04-16 NOTE — Progress Notes (Signed)
Pt. Removed stent halfway out on own. Remaining stent removed per MD order. Pt. Tolerated well.

## 2014-04-16 NOTE — Progress Notes (Signed)
FPTS PGY-3 Interim Note  Paged by RN. Pt pulled ureteral stent placed earlier today partially out. No bleeding or significant pain. Discussed with Dr. Jeffie Pollock; okay to pull stent completely out. Dr. Jeffie Pollock will recheck pt in the morning. Will continue to monitor overnight.  Emmaline Kluver, MD PGY-3, [facility] Family Medicine 04/16/2014, 6:48 PM [place] Service pager: [phone] (text pages welcome through [facility])

## 2014-04-16 NOTE — Progress Notes (Signed)
FMTS Attending Daily Note:  Annabell Sabal MD  [phone] pager  Family Practice pager:  [phone] I have discussed this patient with the resident Dr. Skeet Simmer.  I agree with their findings, assessment, and care plan.  Unable to examine patient as he was taken to [facility] for surgery this AM.  Appreciate Urology input/procedure.  Will have resident check on patient once he arrives back here post-op.

## 2014-04-16 NOTE — Discharge Instructions (Addendum)
Dietary Guidelines to Help Prevent Kidney Stones Your risk of kidney stones can be decreased by adjusting the foods you eat. The most important thing you can do is drink enough fluid. You should drink enough fluid to keep your urine clear or pale yellow. The following guidelines provide specific information for the type of kidney stone you have had. GUIDELINES ACCORDING TO TYPE OF KIDNEY STONE Calcium Oxalate Kidney Stones  Reduce the amount of salt you eat. Foods that have a lot of salt cause your body to release excess calcium into your urine. The excess calcium can combine with a substance called oxalate to form kidney stones.  Reduce the amount of animal protein you eat if the amount you eat is excessive. Animal protein causes your body to release excess calcium into your urine. Ask your dietitian how much protein from animal sources you should be eating.  Avoid foods that are high in oxalates. If you take vitamins, they should have less than 500 mg of vitamin C. Your body turns vitamin C into oxalates. You do not need to avoid fruits and vegetables high in vitamin C. Calcium Phosphate Kidney Stones  Reduce the amount of salt you eat to help prevent the release of excess calcium into your urine.  Reduce the amount of animal protein you eat if the amount you eat is excessive. Animal protein causes your body to release excess calcium into your urine. Ask your dietitian how much protein from animal sources you should be eating.  Get enough calcium from food or take a calcium supplement (ask your dietitian for recommendations). Food sources of calcium that do not increase your risk of kidney stones include:  Broccoli.  Dairy products, such as cheese and yogurt.  Pudding. Uric Acid Kidney Stones  Do not have more than 6 oz of animal protein per day. FOOD SOURCES Animal Protein Sources  Meat (all types).  Poultry.  Eggs.  Fish, seafood. Foods High in [employer]  seasonings.  Soy sauce.  Teriyaki sauce.  Cured and processed meats.  Salted crackers and snack foods.  Fast food.  Canned soups and most canned foods. Foods High in Oxalates  Grains:  Amaranth.  Barley.  Grits.  Wheat germ.  Bran.  Buckwheat flour.  All bran cereals.  Pretzels.  Whole wheat bread.  Vegetables:  Beans (wax).  Beets and beet greens.  Collard greens.  Eggplant.  [place]ole.  Leeks.  Okra.  Parsley.  Rutabagas.  Spinach.  Swiss chard.  Tomato paste.  Fried potatoes.  Sweet potatoes.  Fruits:  Red currants.  Figs.  Kiwi.  Rhubarb.  Meat and Other Protein Sources:  Beans (dried).  Soy burgers and other soybean products.  Miso.  Nuts (peanuts, almonds, pecans, cashews, hazelnuts).  Nut butters.  Sesame seeds and tahini (paste made of sesame seeds).  Poppy seeds.  Beverages:  Chocolate drink mixes.  Soy milk.  Instant iced tea.  Juices made from high-oxalate fruits or vegetables.  Other:  Carob.  Chocolate.  Fruitcake.  Marmalades. Document Released: 12/07/2010 Document Revised: 08/17/2013 Document Reviewed: 07/09/2013 [facility] Patient Information 2015 [place], [place]. This information is not intended to replace advice given to you by your health care provider. Make sure you discuss any questions you have with your health care provider.   Avoid eating red meat, or drinking alcohol.   Please be sure to make your follow up appointments with Dr. [name] and Dr. Jimmy Footman.   We have restarted your home medications. Take those  as prescribed.   Additionally, we have given you medications for pain control. Please only take these as prescribed. They can inhibit your ability to breathe well if you take more than prescribed.   If you experience any severe , or worsening abdominal pain, large amounts of blood in your urine, fevers, nausea, vomiting, or any other concern please return to the ED to be  evaluated.   Thanks for letting us take care of you!  Paula Compton, MD Family Medicine  PGY 1

## 2014-04-16 NOTE — Progress Notes (Signed)
FPTS PGY-3 Interim Note  LATE ENTRY OF NOTE. Pt evaluated ~3:45-4:15 PM.  Pt seen at bedside after urology procedure earlier today. States his pain is some better and he has been able to urinate, "small amounts, a little at a time, frequently," since getting back from [facility]. Denies frank pain when he urinates, though it does burn some; per wife, Dr. Jeffie Pollock discussed starting him on "three medications" (per review of chart, it appears to be Norco, for which pt has a written Rx, and Zofran and Pyridium, which were ordered for discharge), and she requests that they be started now, before he goes.  O: BP 113/61  Pulse 78  Temp(Src) 98.3 F (36.8 C) (Oral)  Resp 19  Ht 5' 6"  (1.676 m)  Wt 215 lb 1.6 oz (97.569 kg)  BMI 34.73 kg/m2  SpO2 94% Gen: elderly male, ambulatory in room with assistance, in NAD Pulm: normal WOB, speaks in full sentences, on [place] Abd: soft, mildly diffusely tender, L > R Ext: warm, well-perfused  A/P: 68yo male s/p cystoscopy / ureteroscopy with lithotripsy and stone extraction with left ureteral stent insertion by Dr. Jeffie Pollock. - plan to observe at least overnight for pain control, ability to urinate well without obvious complication from procedure, and wean off O2 / sats - added Pyridium here, still on morphine PCA for pain - otherwise continue per previous progress note - hopeful for D/C tomorrow  Emmaline Kluver, MD PGY-3, [place] Medicine 04/16/2014, 5:08 PM FPTS Service pager: [phone] (text pages welcome through [facility])

## 2014-04-16 NOTE — Brief Op Note (Signed)
04/15/2014 - 04/16/2014  10:44 AM  PATIENT:  [name]  68 y.o. male  PRE-OPERATIVE DIAGNOSIS:  Left ureteral stone obstruction  POST-OPERATIVE DIAGNOSIS:  Left ureteral stone obstruction  PROCEDURE:  Procedure(s): CYSTOSCOPY/RETROGRADE/LEFT URETEROSCOPY/STONE EXTRACTION WITH BASKET AND LASER LITHOTRIPSY (Left) Left ureteral stent insertion.  SURGEON:  Surgeon(s) and Role:    * Malka So, MD - Primary  PHYSICIAN ASSISTANT:   ASSISTANTS: none   ANESTHESIA:   general  EBL:     BLOOD ADMINISTERED:none  DRAINS: left 6 x 26 JJ stent   LOCAL MEDICATIONS USED:  NONE  SPECIMEN:  Source of Specimen:  left ureteral stone  DISPOSITION OF SPECIMEN:  to family to bring to office  COUNTS:  YES  TOURNIQUET:  * No tourniquets in log *  DICTATION: .Other Dictation: Dictation Number [id number]  PLAN OF CARE: Discharge to home after PACU  PATIENT DISPOSITION:  PACU - hemodynamically stable.   Delay start of Pharmacological VTE agent (>24hrs) due to surgical blood loss or risk of bleeding: not applicable

## 2014-04-16 NOTE — Anesthesia Preprocedure Evaluation (Addendum)
Anesthesia Evaluation  Patient identified by MRN, date of birth, ID band Patient awake    Reviewed: Allergy & Precautions, H&P , NPO status , Patient's Chart, lab work & pertinent test results, reviewed documented beta blocker date and time   History of Anesthesia Complications Negative for: history of anesthetic complications  Airway Mallampati: III TM Distance: >3 FB     Dental  (+) Teeth Intact   Pulmonary sleep apnea (uses BiPAP (was admitted overnight last night at [facility] after respiratory depression with narcotic administration in ED)) ,  asbestosis breath sounds clear to auscultation        Cardiovascular hypertension, On Home Beta Blockers + CAD, + CABG (2005) and + Peripheral Vascular Disease + dysrhythmias Atrial Fibrillation Rhythm:regular Rate:Normal  Hyperlipidemia TEE 3/15 - EF 55%   Neuro/Psych  Headaches, Seizures - (none in 3 years),  Depression Hearing loss 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  Reports fatigue, short term memory difficulty and balance problems as residual effects (uses a walker/wheelchair)), Residual Symptoms    GI/Hepatic Neg liver ROS, GERD-  ,(+)     substance abuse  alcohol use, dysphagia   Endo/Other  diabetes, Oral Hypoglycemic AgentsMorbid obesity  Renal/GU ARFRenal disease (obstructing renal stone - creatinine has increased to 1.64 today)Kidney stones     Musculoskeletal  (+) Arthritis - (spine - chronic back pain),   Abdominal   Peds  Hematology  (+) anemia ,   Anesthesia Other Findings   Reproductive/Obstetrics negative OB ROS                       Anesthesia Physical Anesthesia Plan  ASA: III  Anesthesia Plan: General LMA   Post-op Pain Management:    Induction: Intravenous  Airway Management Planned:   Additional Equipment:   Intra-op Plan:   Post-operative Plan: Extubation in OR  Informed Consent: I have reviewed the patients History and Physical,  chart, labs and discussed the procedure including the risks, benefits and alternatives for the proposed anesthesia with the patient or authorized representative who has indicated his/her understanding and acceptance.   Dental Advisory Given  Plan Discussed with: CRNA and Surgeon  Anesthesia Plan Comments:        Anesthesia Quick Evaluation

## 2014-04-16 NOTE — Progress Notes (Signed)
Family Medicine Teaching Service Daily Progress Note Intern Pager: [phone]  Patient name: [name] Medical record number: [id number] Date of birth: [birth year] Age: 68 y.o. Gender: male  Primary Care Provider: [name], MD Consultants: urology Code Status: full  Pt Overview and Major Events to Date:  8/21: seen by urology, plan for intervention 8/22  Assessment and Plan: [name] is a 68 y.o. male presenting with renal colic due to obstructing L renal stone . PMH is significant for CVA, CAD, DM2, asbestosis, Seizure d/o, HTN, HLD, atrial flutter, OSA on BiPAP, chronic back pain with Rx and EtOH abuse, and dysphagia. He was admitted in 10/2013 for CVA.   Renal stone/renal colic  - Renal colic with 6 mm obstructing stone in left ureter on CT.  - Ua with large blood, Cre trending up but reasonable (0.87->1.14)  - Consuled Urology (Dr.Wrenn), Appreciate recommendations and management  - NPO for procedure today - Trend cre, avoiding NSAIDs due to uptrending Cre and PMH of renal failure   Hypoxia, pleural plaques consistent with asbestosis, OSA  - Current Hypoxia likely from Opiate medication, monitor for signs of infection  - s/p 1 mg dilaudid and 100 mcg fentanyl X 2 in the ED.  - controlled on low dose morphine PCA - CXR and exam clear  - qHS BiPAP (16/8 cm with a rate of 10 per minute per Dr. Guadelupe Sabin note on 8/11)  - monitor closely   HTN history of CAD status post CABG  - well controlled  - Continue home meds except for Lasix, no clear history of CHF in the last echo only shows mild LVH.  - Continue nebivolol  - Monitor  - goal less than 130/90   DM2 Previous A1c 6.4 and 2014; 7.0 here  - Controlled on Januvia at home, hold Januvia and sliding scale insulin  - cbg q4 while not eating  Dysphagia  - Dates that he's had workup for this with outpatient GI, followup outpatient.  - It appears that he tolerates a normal diet, he would like to avoid capsules.   H/o  CVA X 3  - Recent CT angio of head and neck with extensive atherosclerotic disease  - Continue statin and aspirin  - f/u with Dr. Leonie Man OP   FEN/GI: NPO AM for procedure, holding Lasix, NS @50mL /hr Prophylaxis: Hold Subcutaneous heparin for procedure (SCDs in the meanwhile), PPI per home for GERD  Disposition: pending post surgical recs   Subjective:  Feels sleepy this morning, pain better controlled but still bothersome Objective: Temp:  [97.6 F (36.4 C)-98.7 F (37.1 C)] 97.6 F (36.4 C) (08/22 0527) Pulse Rate:  [80-87] 80 (08/22 0527) Resp:  [10-22] 18 (08/22 0527) BP: (116-152)/(60-88) 116/72 mmHg (08/22 0527) SpO2:  [79 %-97 %] 93 % (08/22 0527) Weight:  [215 lb 1.6 oz (97.569 kg)] 215 lb 1.6 oz (97.569 kg) (08/21 1010) Physical Exam: Gen: NAD, alert, cooperative with exam,  CV: RRR, good S1/S2, no murmur  Resp: CTABL, no wheezes, non-labored  Abd: Soft with diffuse tenderness to palpation which was greatest in LLQ, no guarding  Ext: No edema, warm, 2+ DP pulses  Neuro: Alert and oriented  Laboratory:  Recent Labs Lab 04/15/14 0429 04/16/14 0440  WBC 8.2 10.3  HGB 12.8* 11.4*  HCT 37.0* 33.2*  PLT 203 202    Recent Labs Lab 04/15/14 0429 04/16/14 0440  NA 139 138  K 3.9 4.2  CL 101 99  CO2 21 25  BUN  27* 24*  CREATININE 1.14 1.64*  CALCIUM 9.2 8.8  GLUCOSE 144* 165*    UA with 15 ketones, negative leukocytes and nitrites, large hemoglobin, too numerous to count RBCs   Imaging/Diagnostic Tests:  EKG- unchanged from prior ST depression in II Chest x-ray 04/15/2014  IMPRESSION:  No acute disease.  CT renal stone study 04/15/2014  IMPRESSION:  1. Obstructing 6 mm stone in the mid left ureter.  2. Nonobstructive left nephrolithiasis.  3. Chronic findings are stable from prior and noted above.   Bernadene Bell, MD 04/16/2014, 6:44 AM PGY-2, [place] Intern pager: [phone], text pages welcome

## 2014-04-17 LAB — BASIC METABOLIC PANEL
Anion gap: 12 (ref 5–15)
BUN: 25 mg/dL — AB (ref 6–23)
CALCIUM: 8.6 mg/dL (ref 8.4–10.5)
CO2: 25 meq/L (ref 19–32)
Chloride: 101 mEq/L (ref 96–112)
Creatinine, Ser: 1.19 mg/dL (ref 0.50–1.35)
GFR calc Af Amer: 70 mL/min — ABNORMAL LOW (ref >90)
GFR calc non Af Amer: 60 mL/min — ABNORMAL LOW (ref >90)
GLUCOSE: 150 mg/dL — AB (ref 70–99)
Potassium: 4.3 mEq/L (ref 3.7–5.3)
Sodium: 138 mEq/L (ref 137–147)

## 2014-04-17 LAB — GLUCOSE, CAPILLARY
GLUCOSE-CAPILLARY: 146 mg/dL — AB (ref 70–99)
GLUCOSE-CAPILLARY: 143 mg/dL — AB (ref 70–99)
Glucose-Capillary: 109 mg/dL — ABNORMAL HIGH (ref 70–99)
Glucose-Capillary: 142 mg/dL — ABNORMAL HIGH (ref 70–99)
Glucose-Capillary: 151 mg/dL — ABNORMAL HIGH (ref 70–99)
Glucose-Capillary: 156 mg/dL — ABNORMAL HIGH (ref 70–99)
Glucose-Capillary: 158 mg/dL — ABNORMAL HIGH (ref 70–99)

## 2014-04-17 LAB — URIC ACID: URIC ACID, SERUM: 6.6 mg/dL (ref 4.0–7.8)

## 2014-04-17 MED ORDER — OXYCODONE HCL 5 MG PO TABS
15.0000 mg | ORAL_TABLET | Freq: Four times a day (QID) | ORAL | Status: DC | PRN
  Administered 2014-04-18: 15 mg via ORAL
  Filled 2014-04-17: qty 3

## 2014-04-17 MED ORDER — OXYCODONE HCL 5 MG PO TABS
15.0000 mg | ORAL_TABLET | Freq: Four times a day (QID) | ORAL | Status: DC
  Administered 2014-04-17: 15 mg via ORAL
  Filled 2014-04-17: qty 3

## 2014-04-17 MED ORDER — OXYCODONE HCL 5 MG PO TABS: 15.0000 mg | ORAL_TABLET | Freq: Four times a day (QID) | ORAL | Status: DC | PRN

## 2014-04-17 MED ORDER — MORPHINE SULFATE 2 MG/ML IJ SOLN: 2.0000 mg | INTRAMUSCULAR | Status: DC | PRN

## 2014-04-17 NOTE — Progress Notes (Signed)
FMTS Attending Daily Note:  Annabell Sabal MD  [phone] pager  Family Practice pager:  [phone] I have seen and examined this patient and have reviewed their chart. I have discussed this patient with the resident. I agree with the resident's findings, assessment and care plan.  Additionally:  With fairly severe renal colic again today on my exam.  Question ureteral spasm?  Seems to have improved by time of urologist's examination. If continues to improve, switch to PO meds, watch to ensure he tolerates these from improved pain standpoint.  If so, can DC home later today.    Alveda Reasons, MD 04/17/2014 12:28 PM

## 2014-04-17 NOTE — Progress Notes (Signed)
Patient ID: [name], male   DOB: 12/12/1945, 68 y.o.   MRN: [id number] 1 Day Post-Op  Subjective: Mr. Gervasi had successful removal of his left distal stone yesterday and had a stent placed but that was dislodged when the string got pulled when he stood.   The stent was removed the rest of the way last night.  He has had some recurrent colic but it is improving.   He has some irritative voiding symptoms but they are abating as well.   His stone appeared to be consistent with uric acid.  ROS:  Review of Systems  Constitutional: Negative for fever and chills.  Respiratory: Negative for shortness of breath.   Cardiovascular: Negative for chest pain.  Gastrointestinal: Positive for abdominal pain (in the LLQ).  Genitourinary: Positive for dysuria.    Anti-infectives: Anti-infectives   Start     Dose/Rate Route Frequency Ordered Stop   04/16/14 0600  ciprofloxacin (CIPRO) IVPB 400 mg     400 mg 200 mL/hr over 60 Minutes Intravenous On call to O.R. 04/15/14 2350 04/16/14 1004      Current Facility-Administered Medications  Medication Dose Route Frequency Provider Last Rate Last Dose  . aspirin chewable tablet 81 mg  81 mg Oral Daily Timmothy Euler, MD   81 mg at 04/17/14 1043  . atorvastatin (LIPITOR) tablet 80 mg  80 mg Oral q1800 Timmothy Euler, MD   80 mg at 04/16/14 1743  . diphenhydrAMINE (BENADRYL) injection 12.5 mg  12.5 mg Intravenous Q6H PRN Bernadene Bell, MD       Or  . diphenhydrAMINE (BENADRYL) 12.5 MG/5ML elixir 12.5 mg  12.5 mg Oral Q6H PRN Bernadene Bell, MD      . FLUoxetine (PROZAC) capsule 20 mg  20 mg Oral Daily Timmothy Euler, MD   20 mg at 04/17/14 1043  . insulin aspart (novoLOG) injection 0-9 Units  0-9 Units Subcutaneous TID WC Timmothy Euler, MD   1 Units at 04/17/14 [id number]  . morphine 1 MG/ML PCA injection   Intravenous 6 times per day Bernadene Bell, MD   4 mg at 04/17/14 0741  . naloxone [facility]) injection 0.4 mg  0.4 mg Intravenous PRN Bernadene Bell, MD       And  . sodium chloride 0.9 % injection 9 mL  9 mL Intravenous PRN Bernadene Bell, MD      . nebivolol (BYSTOLIC) tablet 10 mg  10 mg Oral BID Timmothy Euler, MD   10 mg at 04/17/14 1043  . ondansetron (ZOFRAN) injection 4 mg  4 mg Intravenous Q6H PRN Bernadene Bell, MD      . pantoprazole (PROTONIX) EC tablet 40 mg  40 mg Oral Daily Timmothy Euler, MD   40 mg at 04/17/14 1044  . phenazopyridine (PYRIDIUM) tablet 200 mg  200 mg Oral TID PRN [address], MD   200 mg at 04/16/14 1721  . sodium chloride 0.9 % injection 3 mL  3 mL Intravenous Q12H Timmothy Euler, MD   3 mL at 04/16/14 2106  . traZODone (DESYREL) tablet 150 mg  150 mg Oral QHS Timmothy Euler, MD   150 mg at 04/16/14 2106     Objective: Vital signs in last 24 hours: Temp:  [97.8 F (36.6 C)-98.6 F (37 C)] 97.8 F (36.6 C) (08/23 0354) Pulse Rate:  [72-81] 72 (08/23 0354) Resp:  [6-23] 6 (08/23 0741) BP: (108-134)/(61-73) 109/62 mmHg (08/23  0354) SpO2:  [90 %-97 %] 94 % (08/23 0741)  Intake/Output from previous day: 08/22 0701 - 08/23 0700 In: 843 [P.O.:240; I.V.:603] Out: 675 [Urine:675] Intake/Output this shift: Total I/O In: 240 [P.O.:240] Out: -    Physical Exam  Vitals reviewed. Constitutional: He is oriented to person, place, and time and well-developed, well-nourished, and in no distress.  Pulmonary/Chest: Effort normal. No respiratory distress.  Abdominal: Soft. There is tenderness (in the LLQ). There is no guarding.  Neurological: He is alert and oriented to person, place, and time.    Lab Results:   Recent Labs  04/15/14 0429 04/16/14 0440  WBC 8.2 10.3  HGB 12.8* 11.4*  HCT 37.0* 33.2*  PLT 203 202   BMET  Recent Labs  04/16/14 0440 04/17/14 0350  NA 138 138  K 4.2 4.3  CL 99 101  CO2 25 25  GLUCOSE 165* 150*  BUN 24* 25*  CREATININE 1.64* 1.19  CALCIUM 8.8 8.6   PT/INR No results found for this basename: LABPROT, INR,  in the last 72  hours ABG No results found for this basename: PHART, PCO2, PO2, HCO3,  in the last 72 hours  Studies/Results: No results found.   Assessment: s/p Procedure(s): CYSTOSCOPY/RETROGRADE/LEFT URETEROSCOPY/STONE EXTRACTION WITH BASKET AND LASER LITHOTRIPSY  His stent came out last night and he is having some colic, but the ureteroscopy was routine without significant ureteral trauma and his pain shouldn't require stent replacement. His stone appeared to be uric acid.  Plan: I will check a uric acid level.  Pain management as required. D/C per medical service.  He will need f/u in my office in 2-3 weeks.     LOS: 2 days    Malka So 04/17/2014

## 2014-04-17 NOTE — Progress Notes (Signed)
Family Medicine Teaching Service Daily Progress Note Intern Pager: [phone]  Patient name: [name] Medical record number: [id number] Date of birth: [birth year] Age: 68 y.o. Gender: male  Primary Care Provider: [name], MD Consultants: urology Code Status: full  Pt Overview and Major Events to Date:  8/21: seen by urology, s/p Intervention on 8/22, Pt. Self-extrication of ureteral stent.   Assessment and Plan: [name] is a 68 y.o. male presenting with renal colic due to obstructing L renal stone . PMH is significant for CVA, CAD, DM2, asbestosis, Seizure d/o, HTN, HLD, atrial flutter, OSA on BiPAP, chronic back pain with Rx and EtOH abuse, and dysphagia. He was admitted in 10/2013 for CVA.   Renal stone/renal colic  - Renal colic with 6 mm obstructing stone in left ureter on CT.  - Ua with large blood, Cre trending up initially, but now 1.64 > 1.19 trending down.   - Consuled Urology (Dr.Wrenn), Appreciate recommendations and management  - S/P Cystoscopy / Ureteroscopy with lithotripsy and stone extraction and L ureteral stent insertion with subsequent pt. Self-extrication of ureteral stent. Some tenderness with concern for ureteral spasm this am. - Uric acid stone. Uric acid level pending.  - Pyridium for dysuria, Morphine PCA for pain since he is experiencing significant continued tenderness and concern for ureteral spasm will hold transition to percocet po for reevaluation later today and f/u pain tolerance.  - Zofran prn nausea at discharge.  - Creatinine trending down, avoiding NSAIDs due to renal injury and PMH of renal failure.  - If L flank pain becomes more severe will plan for imaging.    Hypoxia, pleural plaques consistent with asbestosis, OSA  - Initial Hypoxia likely from Opiate medication, no signs of infection at this time.  - PCA pump for pain control, will monitor respiratory status as we transition to po medication today.  - controlled on low dose  morphine PCA currently - RR of 6-8 documented this am. Pt. Appropriate RR, and without hypoxia on my interview this am. Will continue to monitor respiratory status.  - CXR and exam clear - qHS BiPAP (16/8 cm with a rate of 10 per minute per Dr. Guadelupe Sabin note on 8/11)  - Sats consistently mid 90's on home O2 regimen. Stable at this time.   HTN history of CAD status post CABG  - well controlled  - Continue home meds except for Lasix. Will continue at discharge. No clear history of CHF in the last echo only shows mild LVH.  - Continue nebivolol  - Monitor  - goal less than 130/90   DM2 Previous A1c 6.4 and 2014; 7.0 here  - Controlled on Januvia at home, hold Januvia and sliding scale insulin  - cbg q4 while not eating, ranging from 140's to 150's today. Will restart Januvia at home.   Dysphagia  - States that he's had workup for this with outpatient GI, followup outpatient.  - It appears that he tolerates a normal diet, he would like to avoid capsules.   H/o CVA X 3  - Recent CT angio of head and neck with extensive atherosclerotic disease  - Continue statin and aspirin  - f/u with Dr. Leonie Man OP   FEN/GI: NPO AM for procedure, holding Lasix, NS @50mL /hr Prophylaxis: Hold Subcutaneous heparin for procedure (SCDs in the meanwhile), PPI per home for GERD  Disposition: Home later today / tomorrow pending reevaluation for continued severe L flank pain.   Subjective:  Pt. Awakened upon my  arrival this am. He does not complain of SOB, or increased WOB. He says that he slept well, and feels at his baseline respiratory status. He does endorse some L flank pain he says 5/10, however upon repeat exam by attending physician pt. Was significantly more tender and had less control of pain. Otherwise, pt. Up ad lib with urination prn. No gross hematuria per pt.   Objective: Temp:  [97.8 F (36.6 C)-98.6 F (37 C)] 97.8 F (36.6 C) (08/23 0354) Pulse Rate:  [72-83] 72 (08/23 0354) Resp:  [6-23] 6  (08/23 0741) BP: (108-144)/(61-74) 109/62 mmHg (08/23 0354) SpO2:  [90 %-99 %] 94 % (08/23 0741) FiO2 (%):  [40 %] 40 % (08/22 1050) Physical Exam: Gen: NAD, alert, cooperative with exam,  CV: RRR, good S1/S2, no murmur  Resp: CTABL, no wheezes, non-labored  Abd: Soft, Non-distended, Mild-Mod TTP over L flank and LLQ, No peritoneal signs, No organomegaly noted.  Ext: No edema, warm, 2+ DP pulses  Neuro: Alert and oriented  Laboratory:  Recent Labs Lab 04/15/14 0429 04/16/14 0440  WBC 8.2 10.3  HGB 12.8* 11.4*  HCT 37.0* 33.2*  PLT 203 202    Recent Labs Lab 04/15/14 0429 04/16/14 0440 04/17/14 0350  NA 139 138 138  K 3.9 4.2 4.3  CL 101 99 101  CO2 21 25 25   BUN 27* 24* 25*  CREATININE 1.14 1.64* 1.19  CALCIUM 9.2 8.8 8.6  GLUCOSE 144* 165* 150*    UA with 15 ketones, negative leukocytes and nitrites, large hemoglobin, too numerous to count RBCs   Uric Acid - pending.   Imaging/Diagnostic Tests:  EKG- unchanged from prior ST depression in II Chest x-ray 04/15/2014  IMPRESSION:  No acute disease.  CT renal stone study 04/15/2014  IMPRESSION:  1. Obstructing 6 mm stone in the mid left ureter.  2. Nonobstructive left nephrolithiasis.  3. Chronic findings are stable from prior and noted above.   Aquilla Hacker, MD 04/17/2014, 9:02 AM PGY-2, [place] Intern pager: [phone], text pages welcome

## 2014-04-18 ENCOUNTER — Encounter (HOSPITAL_COMMUNITY): Payer: Self-pay | Admitting: Urology

## 2014-04-18 LAB — BASIC METABOLIC PANEL
ANION GAP: 13 (ref 5–15)
BUN: 25 mg/dL — ABNORMAL HIGH (ref 6–23)
CHLORIDE: 103 meq/L (ref 96–112)
CO2: 27 mEq/L (ref 19–32)
CREATININE: 1.06 mg/dL (ref 0.50–1.35)
Calcium: 8.9 mg/dL (ref 8.4–10.5)
GFR calc non Af Amer: 69 mL/min — ABNORMAL LOW (ref >90)
GFR, EST AFRICAN AMERICAN: 80 mL/min — AB (ref >90)
Glucose, Bld: 110 mg/dL — ABNORMAL HIGH (ref 70–99)
Potassium: 3.7 mEq/L (ref 3.7–5.3)
SODIUM: 143 meq/L (ref 137–147)

## 2014-04-18 LAB — GLUCOSE, CAPILLARY
GLUCOSE-CAPILLARY: 119 mg/dL — AB (ref 70–99)
GLUCOSE-CAPILLARY: 110 mg/dL — AB (ref 70–99)
Glucose-Capillary: 113 mg/dL — ABNORMAL HIGH (ref 70–99)

## 2014-04-18 MED ORDER — BACLOFEN 10 MG PO TABS: 10.0000 mg | ORAL_TABLET | Freq: Three times a day (TID) | ORAL | Status: DC

## 2014-04-18 NOTE — Discharge Summary (Signed)
[facility] Discharge Summary  Patient name: [name] Medical record number: [id number] Date of birth: 1945-11-29 Age: 68 y.o. Gender: male Date of Admission: 04/15/2014  Date of Discharge: 04/18/2014 Admitting Physician: Alveda Reasons, MD  Primary Care Provider: [name], MD Consultants: Urology  Indication for Hospitalization: Nephrolithiasis  Discharge Diagnoses/Problem List:  CAD DM II HTN Left Ureteral Stone - S/P removal OSA CVA  Disposition: Discharge to home  Discharge Condition: Stable  Discharge Exam:  Temp: [97.5 F (36.4 C)-98.2 F (36.8 C)] 97.5 F (36.4 C) (08/24 0443)  Pulse Rate: [68-78] 68 (08/24 0443)  Resp: [6-18] 16 (08/24 0443)  BP: (103-124)/(59-73) 124/73 mmHg (08/24 0443)  SpO2: [92 %-96 %] 95 % (08/24 0443)  Physical Exam:  Gen: NAD, alert, cooperative, in a good mood.  CV: RRR, No MGR  Resp: CTABL, No audible wheezes or crackles, appropriate rate, Non-labored  Abd: Soft, Non-distended, Mild TTP over L flank improved from previous, No peritoneal signs, No organomegaly noted.  Ext: No edema, warm, 2+ DP pulses  Neuro: Alert and orientedx3   Brief Hospital Course:   #Renal stone/renal colic Pt. Presented to the ED with flank pain, and hematuria. Upon further evaluation he was found to have nephrolithiasis with stone measuring >59m. He was admitted for pain management with PCA pump and subsequent ureteroscopy, with stone retrieval and stent placement. After his stone extraction, the patient unknowingly pulled his own stent out after noticing the retrieval line extending from his urethra. However, he continued to recover well, and did not experience any further gross hematuria, and his pain became controlled with oral medications. On his day of discharge he was found to have well controlled pain, and to be stable and ready for discharge.    Hypoxia, pleural plaques consistent with asbestosis, OSA  At the time  of admission in the ED the patient was given IV Fentanyl and morphine for pain control. He has a history of asbestosis and restrictive lung disease in addition to CHF which likely contributed to the development of hypoxia with O2 saturations falling to the 60-70% range. IV narcotics were then discontinued, and the patient was given Oxygen via nasal cannula. He recovered well, and he did not experience any further hypoxia or episodes of respiratory distress during his admission. He remained on his baseline 3L of O2 throughout the rest of his admission. At the time of his discharge he was found to be tolerating his 3L of O2 very well with saturations near 100%. He was found to be stable and ready for discharge.   HTN history of CAD status post CABG  Pt. Has a history of HTN in the setting of coronary artery disease that required CABG. His blood pressures remained well controlled on his home medication regimen throughout his hospitalization. His blood pressures were at baseline / stable at discharge.   DM2 Previous A1c 6.4 and 2014; 7.0 here  After admission he was found to have a A1C of 7.0. His blood glucose has been well controlled at home. His Januvia was discontinued during this hospitalization and the patient was started on sliding scale insulin. His blood glucose was found to be between 140-150 between meals on the day of his discharge. His Januvia was restarted at discharge.   Issues for Follow Up:  1. Renal Stone / Renal Colic - Uric acid stone found. Will nee further outpatient monitoring / diet modifications in order to prevent recurrence.  2. Hypoxia - found to  be hypoxic with narcotic medications. Will need close monitoring of respiratory status.   Significant Procedures: None  Significant Labs and Imaging:  Recent Labs  Lab  04/15/14 0429  04/16/14 0440   WBC  8.2  10.3   HGB  12.8*  11.4*   HCT  37.0*  33.2*   PLT  203  202     Recent Labs  Lab  04/15/14 0429  04/16/14 0440   04/17/14 0350   NA  139  138  138   K  3.9  4.2  4.3   CL  101  99  101   CO2  21  25  25    BUN  27*  24*  25*   CREATININE  1.14  1.64*  1.19   CALCIUM  9.2  8.8  8.6   GLUCOSE  144*  165*  150*    UA with 15 ketones, negative leukocytes and nitrites, large hemoglobin, too numerous to count RBCs   Uric Acid - 6.6.   Imaging/Diagnostic Tests:  EKG- unchanged from prior ST depression in II  Chest x-ray 04/15/2014  IMPRESSION:  No acute disease.   CT renal stone study 04/15/2014  IMPRESSION:  1. Obstructing 6 mm stone in the mid left ureter.  2. Nonobstructive left nephrolithiasis.  3. Chronic findings are stable from prior and noted above.   Results/Tests Pending at Time of Discharge: None  Discharge Medications:    Medication List    STOP taking these medications       methocarbamol 750 MG tablet  Commonly known as:  ROBAXIN     metroNIDAZOLE 500 MG tablet  Commonly known as:  FLAGYL      TAKE these medications       atorvastatin 80 MG tablet  Commonly known as:  LIPITOR  Take 1 tablet (80 mg total) by mouth daily.     baclofen 10 MG tablet  Commonly known as:  LIORESAL  Take 1 tablet (10 mg total) by mouth 3 (three) times daily.     FLUoxetine 10 MG capsule  Commonly known as:  PROZAC  Take 20 mg by mouth daily.     furosemide 20 MG tablet  Commonly known as:  LASIX  Take 40 mg by mouth daily.     HYDROcodone-acetaminophen 5-325 MG per tablet  Commonly known as:  NORCO  Take 1 tablet by mouth every 6 (six) hours as needed for moderate pain.     nebivolol 10 MG tablet  Commonly known as:  BYSTOLIC  Take 1 tablet (10 mg total) by mouth 2 (two) times daily.     nitroGLYCERIN 0.4 MG SL tablet  Commonly known as:  NITROSTAT  Place 1 tablet (0.4 mg total) under the tongue every 5 (five) minutes as needed. For chest pain     omeprazole 20 MG capsule  Commonly known as:  PRILOSEC  Take 20 mg by mouth 2 (two) times daily before a meal.     ondansetron  4 MG tablet  Commonly known as:  ZOFRAN  Take 1 tablet (4 mg total) by mouth every 8 (eight) hours as needed for nausea or vomiting.     phenazopyridine 200 MG tablet  Commonly known as:  PYRIDIUM  Take 1 tablet (200 mg total) by mouth 3 (three) times daily as needed for pain.     sitaGLIPtin 100 MG tablet  Commonly known as:  JANUVIA  Take 100 mg by mouth daily.     traZODone  150 MG tablet  Commonly known as:  DESYREL  Take 150 mg by mouth at bedtime.        Discharge Instructions: Please refer to Patient Instructions section of EMR for full details.  Patient was counseled important signs and symptoms that should prompt return to medical care, changes in medications, dietary instructions, activity restrictions, and follow up appointments.   Follow-Up Appointments: Follow-up Information   Call Malka So, MD. (Call office for a follow up appt for 1-2 weeks from now.  Bring your stone.  You can see the nurse practitioner if I am not available. )    Specialty:  Urology   Contact information:   [place] [address] [phone]       Follow up with DAUB, STEVE A, MD. Schedule an appointment as soon as possible for a visit in 1 week. [facility] Follow Up )    Specialty:  Family Medicine   Contact information:   [place] [address] [phone]       Aquilla Hacker, MD 04/25/2014, 9:31 PM PGY-1, [place]

## 2014-04-18 NOTE — Care Management Note (Signed)
    Page 1 of 1   04/18/2014     3:11:20 PM CARE MANAGEMENT NOTE 04/18/2014  Patient:  [name], [name]   Account Number:  [id number]  Date Initiated:  04/18/2014  Documentation initiated by:  Alzina Golda  Subjective/Objective Assessment:   Pt adm on 04/15/14 with Lt ureteral stone obstruction.  PTA, pt independent, lives with wife.     Action/Plan:   Pt has hx of asbestosis.  Room air sat 86% at rest; this qualifies him for home Oxygen.  Will arrange home O2, per MD orders.  Portable tank delivered to pt prior to dc home.   Anticipated DC Date:  04/18/2014   Anticipated DC Plan:  [place]  CM consult      Choice offered to / List presented to:     DME arranged  [facility]      DME agency  [facility]        Status of service:  Completed, signed off Medicare Important Message given?  YES (If response is "NO", the following Medicare IM given date fields will be blank) Date Medicare IM given:  04/18/2014 Medicare IM given by:  Reznor Ferrando Date Additional Medicare IM given:   Additional Medicare IM given by:    Discharge Disposition:  HOME/SELF CARE  Per UR Regulation:  Reviewed for med. necessity/level of care/duration of stay  If discussed at [place] of Stay Meetings, dates discussed:    Comments:

## 2014-04-18 NOTE — Progress Notes (Signed)
Patient ID: [name], male   DOB: 1946-07-25, 68 y.o.   MRN: [id number] 2 Days Post-Op  Subjective: [name] is doing well this morning.  He is s/p left ureteroscopic stone extraction of apparent uric acid stone.  He slept all night and has no further flank pain.   His uric acid level was normal at 6.6.   ROS:  Review of Systems  Constitutional: Negative for fever.  Respiratory: Negative for shortness of breath.   Gastrointestinal: Negative for nausea.    Anti-infectives: Anti-infectives   Start     Dose/Rate Route Frequency Ordered Stop   04/16/14 0600  ciprofloxacin (CIPRO) IVPB 400 mg     400 mg 200 mL/hr over 60 Minutes Intravenous On call to O.R. 04/15/14 2350 04/16/14 1004      Current Facility-Administered Medications  Medication Dose Route Frequency Provider Last Rate Last Dose  . aspirin chewable tablet 81 mg  81 mg Oral Daily Timmothy Euler, MD   81 mg at 04/17/14 1043  . atorvastatin (LIPITOR) tablet 80 mg  80 mg Oral q1800 Timmothy Euler, MD   80 mg at 04/17/14 1645  . FLUoxetine (PROZAC) capsule 20 mg  20 mg Oral Daily Timmothy Euler, MD   20 mg at 04/17/14 1043  . insulin aspart (novoLOG) injection 0-9 Units  0-9 Units Subcutaneous TID WC Timmothy Euler, MD   1 Units at 04/17/14 1646  . morphine 2 MG/ML injection 2 mg  2 mg Intravenous Q3H PRN Aquilla Hacker, MD      . nebivolol (BYSTOLIC) tablet 10 mg  10 mg Oral BID Timmothy Euler, MD   10 mg at 04/17/14 2122  . oxyCODONE (Oxy IR/ROXICODONE) immediate release tablet 15 mg  15 mg Oral Q6H PRN Katheren Shams, DO      . pantoprazole (PROTONIX) EC tablet 40 mg  40 mg Oral Daily Timmothy Euler, MD   40 mg at 04/17/14 1044  . phenazopyridine (PYRIDIUM) tablet 200 mg  200 mg Oral TID PRN [address], MD   200 mg at 04/17/14 1652  . sodium chloride 0.9 % injection 3 mL  3 mL Intravenous Q12H Timmothy Euler, MD   3 mL at 04/17/14 2123  . traZODone (DESYREL) tablet 150 mg  150 mg Oral QHS  Timmothy Euler, MD   150 mg at 04/17/14 2123     Objective: Vital signs in last 24 hours: Temp:  [97.5 F (36.4 C)-98.2 F (36.8 C)] 97.5 F (36.4 C) (08/24 0443) Pulse Rate:  [68-78] 68 (08/24 0443) Resp:  [6-18] 16 (08/24 0443) BP: (103-124)/(59-73) 124/73 mmHg (08/24 0443) SpO2:  [92 %-96 %] 95 % (08/24 0443)  Intake/Output from previous day: 08/23 0701 - 08/24 0700 In: 720 [P.O.:720] Out: 400 [Urine:400] Intake/Output this shift: Total I/O In: -  Out: 250 [Urine:250]   Physical Exam  Constitutional: He is well-developed, well-nourished, and in no distress.  Abdominal: Soft. There is no tenderness. There is no guarding.    Lab Results:   Recent Labs  04/16/14 0440  WBC 10.3  HGB 11.4*  HCT 33.2*  PLT 202   BMET  Recent Labs  04/16/14 0440 04/17/14 0350  NA 138 138  K 4.2 4.3  CL 99 101  CO2 25 25  GLUCOSE 165* 150*  BUN 24* 25*  CREATININE 1.64* 1.19  CALCIUM 8.8 8.6   PT/INR No results found for this basename: LABPROT, INR,  in the last  72 hours ABG No results found for this basename: PHART, PCO2, PO2, HCO3,  in the last 72 hours  Studies/Results:  Uric acid level reviewed.  No results found.   Assessment: s/p Procedure(s): CYSTOSCOPY/RETROGRADE/LEFT URETEROSCOPY/STONE EXTRACTION WITH BASKET AND LASER LITHOTRIPSY  He is doing well with no further pain and a normal Uric acid.  Plan: He will need f/u in our office in 2-3 weeks and should bring his stones for analysis.  With the normal uric acid he won't need allopurinol but should be encouraged to moderate his animal protein intake and increase his water intake to 100oz daily.      LOS: 3 days    Malka So 04/18/2014

## 2014-04-18 NOTE — Progress Notes (Addendum)
Pt given discharge instructions, medication list and paper prescriptions, other medicaitons sent to patient pharmacy. All questions were answered. Will discharge home once patient received 02 tank for discharge. Mikaili Flippin, Bettina Gavia RN [phone] patient has had oxygen delivered will discharge home as ordered. Tenisha Fleece, Bettina Gavia RN

## 2014-04-18 NOTE — Progress Notes (Signed)
I discussed with  Dr Minda Ditto.  I agree with their plans documented in their progress note.

## 2014-04-18 NOTE — Progress Notes (Signed)
SATURATION QUALIFICATIONS: (This note is used to comply with regulatory documentation for home oxygen)  Patient Saturations on Room Air at Rest = 86%  Patient Saturations on 2 Liters of oxygen 92%  Please briefly explain why patient needs home oxygen: patient desats on room air, patient requiring oxygen to maintain oxygen saturation as ordered. Anabelen Kaminsky, Bettina Gavia RN

## 2014-04-18 NOTE — Progress Notes (Signed)
Family Medicine Teaching Service Daily Progress Note Intern Pager: [phone]  Patient name: [name] Medical record number: [id number] Date of birth: [birth year] Age: 68 y.o. Gender: male  Primary Care Provider: [name], MD Consultants: urology Code Status: full  Pt Overview and Major Events to Date:  8/21: seen by urology, s/p Intervention on 8/22, Pt. Self-extrication of ureteral stent.   Assessment and Plan: [name] is a 68 y.o. male presenting with renal colic due to obstructing L renal stone . PMH is significant for CVA, CAD, DM2, asbestosis, Seizure d/o, HTN, HLD, atrial flutter, OSA on BiPAP, chronic back pain with Rx and EtOH abuse, and dysphagia. He was admitted in 10/2013 for CVA.   Renal stone/renal colic  - Renal colic with 6 mm obstructing stone in left ureter on CT.  - Ua with large blood, Cre trending up initially, but now 1.64 > 1.19 trending down.   - Consuled Urology (Dr.Wrenn), Appreciate recommendations and management  - S/P Cystoscopy / Ureteroscopy with lithotripsy and stone extraction and L ureteral stent insertion with subsequent pt. Self-extrication of ureteral stent.  - Uric acid stone. Uric acid level 6.6 - Pyridium for dysuria, Morphine PCA for pain transitioned to po pain control yesterday.  - Zofran prn nausea at discharge.  - Creatinine trending down, avoiding NSAIDs due to renal injury and PMH of renal failure.  - Mild L flank pain today, will plan to reevaluate after morning oxycodone dose, and likely d/c this afternoon.    Hypoxia, pleural plaques consistent with asbestosis, OSA  - Initial Hypoxia likely from Opiate medication, no signs of infection at this time.  - Transitioned to oxycodone for po pain control. Respiratory status holding, and at baseline.  - RR 16 this am. Sat  95% on CPAP this am. Will monitor off of CPAP this am.   - CXR and exam clear - qHS BiPAP (16/8 cm with a rate of 10 per minute per Dr. Guadelupe Sabin note on 8/11)  -  Sats consistently mid 90's on home O2 regimen. Stable at this time.   HTN history of CAD status post CABG  - well controlled  - Continue home meds except for Lasix. Will continue at discharge. No clear history of CHF in the last echo only shows mild LVH.  - Continue nebivolol  - Monitor  - goal less than 130/90   DM2 Previous A1c 6.4 and 2014; 7.0 here  - Controlled on Januvia at home, hold Januvia and sliding scale insulin  - cbg q4 while not eating, ranging from 140's to 150's today. Will restart Januvia at home.   Dysphagia  - States that he's had workup for this with outpatient GI, followup outpatient.  - It appears that he tolerates a normal diet, he would like to avoid capsules.   H/o CVA X 3  - Recent CT angio of head and neck with extensive atherosclerotic disease  - Continue statin and aspirin  - f/u with Dr. Leonie Man OP   FEN/GI: Regular diet, holding Lasix, po fluids, Protonix 62m qd. Prophylaxis: Sub-Q Heparin  Disposition: Home today.    Subjective:  Pt. Eating and in a good mood this am. He does report 8/10 pain in his L flank this am. Oxycodone previously placed on prn schedule and his last dose was around 8pm yesterday evening. Otherwise denies gross hematuria, or nausea, fever, or chills. No other complaints. Is agreeable to discharge if pain is better controlled this afternoon.    Objective:  Temp:  [97.5 F (36.4 C)-98.2 F (36.8 C)] 97.5 F (36.4 C) (08/24 0443) Pulse Rate:  [68-78] 68 (08/24 0443) Resp:  [6-18] 16 (08/24 0443) BP: (103-124)/(59-73) 124/73 mmHg (08/24 0443) SpO2:  [92 %-96 %] 95 % (08/24 0443) Physical Exam: Gen: NAD, alert, cooperative, in a good mood.  CV: RRR, No MGR Resp: CTABL, No audible wheezes or crackles, appropriate rate, Non-labored Abd: Soft, Non-distended, Mild TTP over L flank improved from previous, No peritoneal signs, No organomegaly noted.  Ext: No edema, warm, 2+ DP pulses  Neuro: Alert and  orientedx3  Laboratory:  Recent Labs Lab 04/15/14 0429 04/16/14 0440  WBC 8.2 10.3  HGB 12.8* 11.4*  HCT 37.0* 33.2*  PLT 203 202    Recent Labs Lab 04/15/14 0429 04/16/14 0440 04/17/14 0350  NA 139 138 138  K 3.9 4.2 4.3  CL 101 99 101  CO2 21 25 25   BUN 27* 24* 25*  CREATININE 1.14 1.64* 1.19  CALCIUM 9.2 8.8 8.6  GLUCOSE 144* 165* 150*    UA with 15 ketones, negative leukocytes and nitrites, large hemoglobin, too numerous to count RBCs   Uric Acid - 6.6.   Imaging/Diagnostic Tests:  EKG- unchanged from prior ST depression in II  Chest x-ray 04/15/2014  IMPRESSION:  No acute disease.  CT renal stone study 04/15/2014  IMPRESSION:  1. Obstructing 6 mm stone in the mid left ureter.  2. Nonobstructive left nephrolithiasis.  3. Chronic findings are stable from prior and noted above.   Aquilla Hacker, MD 04/18/2014, 6:41 AM PGY-1, [place] Intern pager: [phone], text pages welcome

## 2014-04-19 ENCOUNTER — Telehealth: Payer: Self-pay | Admitting: Neurology

## 2014-04-19 ENCOUNTER — Ambulatory Visit (INDEPENDENT_AMBULATORY_CARE_PROVIDER_SITE_OTHER): Payer: Self-pay | Admitting: Neurology

## 2014-04-19 DIAGNOSIS — I69959 Hemiplegia and hemiparesis following unspecified cerebrovascular disease affecting unspecified side: Secondary | ICD-10-CM

## 2014-04-19 DIAGNOSIS — I69359 Hemiplegia and hemiparesis following cerebral infarction affecting unspecified side: Principal | ICD-10-CM

## 2014-04-19 DIAGNOSIS — I69928 Other speech and language deficits following unspecified cerebrovascular disease: Secondary | ICD-10-CM

## 2014-04-19 DIAGNOSIS — I69328 Other speech and language deficits following cerebral infarction: Secondary | ICD-10-CM | POA: Insufficient documentation

## 2014-04-19 NOTE — Telephone Encounter (Signed)
I called patient, talked with the wife. I went over the results of the CT angiogram of the head and neck. The patient has 70% stenosis of the supraclinoid portion of the carotid artery. This is not surgically amenable, and medical therapy is indicated. I discussed this with the wife.   CT angiogram of the head and neck 04/01/2014:  IMPRESSION:  1. Right ACA occlusion in the distal A2 segment just beyond the  frontopolar artery. Expected evolution of the right ACA infarct  which occurred in March.  2. Severe right ICA supraclinoid segment atherosclerotic stenosis,  numerically estimated at 70%.  3. Additional bilateral ICA siphon calcified plaque, and bilateral  cervical carotid calcified plaque, without additional  hemodynamically significant stenosis.  4. Moderate stenosis at the left vertebral artery origin. Mild  vertebral plaque otherwise.

## 2014-04-19 NOTE — Progress Notes (Signed)
The patient was seen today for a research visit. Modified Rankin scale evaluation was done, the patient is moderately severely impaired following the stroke in March 2015. The patient has a left hemiparesis, gait disorder, able to walk only with a walker short distances. He needs assistance at times with bathing, and dressing, able to feed himself. The patient has reported memory problems since the stroke, unable to operate a motor vehicle, or do the finances. The patient no longer is engaged in hobbies such as target practicing with his pistol.

## 2014-04-20 ENCOUNTER — Ambulatory Visit (INDEPENDENT_AMBULATORY_CARE_PROVIDER_SITE_OTHER): Payer: Medicare Other | Admitting: *Deleted

## 2014-04-20 DIAGNOSIS — I6350 Cerebral infarction due to unspecified occlusion or stenosis of unspecified cerebral artery: Secondary | ICD-10-CM

## 2014-04-20 DIAGNOSIS — I639 Cerebral infarction, unspecified: Secondary | ICD-10-CM

## 2014-04-20 LAB — MDC_IDC_ENUM_SESS_TYPE_REMOTE

## 2014-04-22 ENCOUNTER — Telehealth: Payer: Self-pay | Admitting: Neurology

## 2014-04-22 DIAGNOSIS — M48062 Spinal stenosis, lumbar region with neurogenic claudication: Secondary | ICD-10-CM | POA: Diagnosis not present

## 2014-04-22 DIAGNOSIS — M545 Low back pain, unspecified: Secondary | ICD-10-CM | POA: Diagnosis not present

## 2014-04-22 DIAGNOSIS — Z981 Arthrodesis status: Secondary | ICD-10-CM | POA: Diagnosis not present

## 2014-04-22 DIAGNOSIS — IMO0002 Reserved for concepts with insufficient information to code with codable children: Secondary | ICD-10-CM | POA: Diagnosis not present

## 2014-04-22 DIAGNOSIS — M5137 Other intervertebral disc degeneration, lumbosacral region: Secondary | ICD-10-CM | POA: Diagnosis not present

## 2014-04-22 DIAGNOSIS — G4731 Primary central sleep apnea: Secondary | ICD-10-CM

## 2014-04-22 NOTE — Telephone Encounter (Signed)
Patient is currently on Bipap, but during a recent hospitalization he was placed on 2 liters of O2.  [facility] needs an order to be able to have the O2 bled through [place].  Right now he is only using the O2.

## 2014-04-25 NOTE — Telephone Encounter (Signed)
Please advise patient that he should start his BiPAP again and we will do overnight pulse oximetry testing to monitor his oxygen level while he is on BiPAP therapy and then decide if he needs to have additional oxygen to it. I do not see where he needed BiPAP and oxygen together in the discharge summary.

## 2014-04-26 NOTE — Discharge Summary (Signed)
I discussed with  Dr Minda Ditto.  I agree with their plans documented in their discharge note.

## 2014-04-27 ENCOUNTER — Ambulatory Visit (INDEPENDENT_AMBULATORY_CARE_PROVIDER_SITE_OTHER): Payer: Medicare Other | Admitting: Emergency Medicine

## 2014-04-27 VITALS — BP 98/59 | HR 64 | Temp 98.3°F | Resp 18 | Wt 212.0 lb

## 2014-04-27 DIAGNOSIS — R82998 Other abnormal findings in urine: Secondary | ICD-10-CM

## 2014-04-27 DIAGNOSIS — I2510 Atherosclerotic heart disease of native coronary artery without angina pectoris: Secondary | ICD-10-CM

## 2014-04-27 DIAGNOSIS — Z87442 Personal history of urinary calculi: Secondary | ICD-10-CM | POA: Diagnosis not present

## 2014-04-27 DIAGNOSIS — E119 Type 2 diabetes mellitus without complications: Secondary | ICD-10-CM

## 2014-04-27 DIAGNOSIS — Z23 Encounter for immunization: Secondary | ICD-10-CM | POA: Diagnosis not present

## 2014-04-27 DIAGNOSIS — R8281 Pyuria: Secondary | ICD-10-CM

## 2014-04-27 LAB — POCT URINALYSIS DIPSTICK
Bilirubin, UA: NEGATIVE
Blood, UA: NEGATIVE
GLUCOSE UA: NEGATIVE
Ketones, UA: NEGATIVE
Nitrite, UA: NEGATIVE
Protein, UA: 30
Spec Grav, UA: 1.015
UROBILINOGEN UA: 0.2
pH, UA: 6.5

## 2014-04-27 LAB — POCT UA - MICROSCOPIC ONLY
Bacteria, U Microscopic: NEGATIVE
CASTS, UR, LPF, POC: NEGATIVE
CRYSTALS, UR, HPF, POC: NEGATIVE
Epithelial cells, urine per micros: NEGATIVE
RBC, URINE, MICROSCOPIC: NEGATIVE
Yeast, UA: NEGATIVE

## 2014-04-27 LAB — BASIC METABOLIC PANEL
BUN: 21 mg/dL (ref 6–23)
CALCIUM: 8.8 mg/dL (ref 8.4–10.5)
CO2: 26 mEq/L (ref 19–32)
Chloride: 100 mEq/L (ref 96–112)
Creat: 0.98 mg/dL (ref 0.50–1.35)
Glucose, Bld: 106 mg/dL — ABNORMAL HIGH (ref 70–99)
Potassium: 4.2 mEq/L (ref 3.5–5.3)
Sodium: 139 mEq/L (ref 135–145)

## 2014-04-27 LAB — GLUCOSE, POCT (MANUAL RESULT ENTRY): POC GLUCOSE: 99 mg/dL (ref 70–99)

## 2014-04-27 NOTE — Progress Notes (Signed)
   Subjective:    Patient ID: [name], male    DOB: 09-16-1945, 68 y.o.   MRN: [id number]  HPI 68 year old male presents from [facility] for follow up after hospital stay  Discharged from hospital 04/18/14. Post kidney stone extraction.   [place] is the supplier [phone], needs approval to run O2 thru Bipap, needs attachment, and needs travel O2 tanks-"[employer]".  Patient has an upcoming appointment at the [facility].  Patient will pick up [employer] on Wednesday and return after [place] appointment on Friday. Faxing note stating to run 2 liters through the Bipap machine to [place]   Not able to walk more than 100 feet per patient, needs PT  Oxygen on 2 liters, O2=96%    Review of Systems     Objective:   Physical Exam  Constitutional:  Patient is a chronically ill male on O2  Neck: No thyromegaly present.  Cardiovascular: Normal rate and regular rhythm.   Pulmonary/Chest: Effort normal and breath sounds normal. No respiratory distress. He has no wheezes.  Abdominal: Soft.  Musculoskeletal:  He has significant instability and difficulty with gait. He is currently in a wheelchair.  Neurological:  He is alert and oriented. There are no focal neurological deficits. He does have significant difficulty with ambulation    Results for orders placed in visit on 04/27/14  POCT UA - MICROSCOPIC ONLY      Result Value Ref Range   WBC, Ur, HPF, POC 10-12     RBC, urine, microscopic neg     Bacteria, U Microscopic neg     Mucus, UA trace     Epithelial cells, urine per micros neg     Crystals, Ur, HPF, POC neg     Casts, Ur, LPF, POC neg     Yeast, UA neg    POCT URINALYSIS DIPSTICK      Result Value Ref Range   Color, UA yellow     Clarity, UA clear     Glucose, UA neg     Bilirubin, UA neg     Ketones, UA neg     Spec Grav, UA 1.015     Blood, UA neg     pH, UA 6.5     Protein, UA 30     Urobilinogen, UA 0.2     Nitrite, UA neg       Leukocytes, UA small (1+)     No orders of the defined types were placed in this encounter.        Assessment & Plan:  Urine culture was done. We'll try and get her O2 improved that she can use for transportation.  Flu shot was given today. Will get approval for him to use O2 at 2 L through the BiPAP machine.

## 2014-04-27 NOTE — Telephone Encounter (Signed)
Spoke with patient's wife and shared Dr Guadelupe Sabin message below, she verbalized understanding, said that patient started the BIPAP w/o oxygen 2 days ago and is sleeping much better.

## 2014-04-28 NOTE — Progress Notes (Signed)
Loop recorder 

## 2014-04-29 LAB — URINE CULTURE
COLONY COUNT: NO GROWTH
ORGANISM ID, BACTERIA: NO GROWTH

## 2014-05-03 ENCOUNTER — Encounter: Payer: Self-pay | Admitting: Internal Medicine

## 2014-05-03 DIAGNOSIS — N201 Calculus of ureter: Secondary | ICD-10-CM | POA: Diagnosis not present

## 2014-05-05 ENCOUNTER — Encounter: Payer: Self-pay | Admitting: Neurology

## 2014-05-05 LAB — GLUCOSE, CAPILLARY: GLUCOSE-CAPILLARY: 158 mg/dL — AB (ref 70–99)

## 2014-05-06 LAB — MDC_IDC_ENUM_SESS_TYPE_REMOTE

## 2014-05-09 ENCOUNTER — Ambulatory Visit (INDEPENDENT_AMBULATORY_CARE_PROVIDER_SITE_OTHER): Payer: Medicare Other | Admitting: Nurse Practitioner

## 2014-05-09 ENCOUNTER — Encounter: Payer: Self-pay | Admitting: Nurse Practitioner

## 2014-05-09 ENCOUNTER — Encounter: Payer: Self-pay | Admitting: Neurology

## 2014-05-09 VITALS — BP 110/68 | HR 70 | Ht 65.0 in | Wt 208.0 lb

## 2014-05-09 DIAGNOSIS — I2510 Atherosclerotic heart disease of native coronary artery without angina pectoris: Secondary | ICD-10-CM | POA: Diagnosis not present

## 2014-05-09 DIAGNOSIS — G4731 Primary central sleep apnea: Secondary | ICD-10-CM

## 2014-05-09 DIAGNOSIS — G4733 Obstructive sleep apnea (adult) (pediatric): Secondary | ICD-10-CM

## 2014-05-09 DIAGNOSIS — G4737 Central sleep apnea in conditions classified elsewhere: Secondary | ICD-10-CM

## 2014-05-09 NOTE — Progress Notes (Addendum)
PATIENT: [name] DOB: 1945-12-23  REASON FOR VISIT: routine follow up for CPAP compliance HISTORY FROM: patient  HISTORY OF PRESENT ILLNESS: Mr. Ram is a very pleasant 68 year old left-handed gentleman with an underlying complex medical history of heart disease, hyperlipidemia, obesity, hypertension, history of seizure disorder, history of atrial flutter, depression, chronic back pain, hearing loss, diverticulitis, history of alcohol and prescription medicine abuse, diabetes, reflux disease, recurrent headaches, and stroke x 3 (in the [place] and embolic stroke in 0/0370), who presents for followup consultation of his obstructive sleep apnea. He is accompanied by his wife again today. I first met him on 12/22/2013 at the request of my colleague Dr. Leonie Man, was him for his stroke, at which time the patient reported a prior diagnosis of obstructive sleep apnea over 5 years ago. He tried CPAP, but was not able to tolerate it, as he kept pulling the mask off. He then tried a dental appliance for about 2 years, which may have helped some and he did have several home sleep tests, per his dentist, but eventually, he stopped using the appliance, and it has been lost or chewed up by the dog. He reported snoring, daytime somnolence, and restless sleep. I suggested reevaluation for obstructive sleep apnea with a sleep study. He had a split-night sleep study on 01/24/2014 and I reviewed his sleep test results with him in detail today. Baseline sleep efficiency was reduced at 44% with a long latency to sleep of 73.5 minutes and wake after sleep onset of 15.5 minutes with moderate sleep fragmentation noted. He had absence of slow-wave sleep and a mildly reduced amount of REM sleep. REM latency was mildly reduced at 67.5 minutes. He had no significant periodic leg movements of sleep. His total AHI was highly elevated at 126.9 per hour, baseline oxygen saturation was 91%, nadir was 79% in REM sleep. He was titrated  on CPAP. Sleep efficiency was much improved and arousal index was much improved. He had a markedly increased amount of REM sleep at 44%. He also achieved deep sleep. He exhibited central respiratory events while on CPAP therapy with more than 50% of the respiratory events being central in nature. He was first tried on CPAP from 5-12 cm as well as different EPR settings. His AHI was not reduced significantly and he had persistent lower oxygen saturations in the low 90s to high 80s. He was therefore switched to BiPAP standard mode and titrated from 12/8 cm to 14/8 cm but had residual central respiratory events was therefore placed on BiPAP ST. He was titrated from 12/7 cm to 14/7 cm and was also briefly tried on ASV. He was switched back to BiPAP ST. Final pressure setting was 16/8 cm with a rate of 10 per minute. I reviewed his compliance data from 02/15/2014 to 03/16/2014 which is a total of 30 days during which time he used his BiPAP every night with percent use more than 4 hours at 100%, indicating superb compliance. Average usage was 10 hours and 14 minutes, sitting as mentioned 16/8 cm with a rate of 10 with a residual AHI of only 6 per hour and very acceptable leak at 19.1 for the 95th percentile.   8/11/5 (SA): Today, I reviewed his compliance data from 03/06/2014 through 04/04/2014 which is a total of 30 days, during which time he uses BiPAP every night. Percent used days greater than 4 hours was 96%, indicating excellent compliance, residual AHI 5.5 per hour, very acceptable, leak acceptable at 22.2  for the 95th percentile. Settings unchanged.  Today, he reports doing well breathing wise. He has had some leg tremors on the L and it tends to give out. He has been using a rolling walker from the [place] and is doing well with that. He wants to get his drivers license renewed, as it is expiring today, but he is not planning to drive any time soon. He had a CT angiogram head and neck last month and I discussed  findings with them briefly. I encouraged him to discuss findings in more detail with Dr. Leonie Man.   Update 05/09/14 (LL): I reviewed the patient's bipap compliance data from 02/08/14 to 05/08/14, which is a total of 90 days, during which time the patient used bipap every day except for 13 days. These days when the patient was hospitalized for kidney stones. The average usage for all days was 9 hours and  6 minutes. The percent used days greater than 4 hours was 83%, indicating excellent compliance. The residual AHI was 5.5 per hour, indicating an appropriate treatment pressure of 16/8 cm with backup breath rate of 10. Leak acceptable at 20.4 for the 95th percentile. Settings unchanged. He is now on daytime oxygen, ordered after recent hospital admission. Patient had ONO on Bipap completed last week, 05/05/14.  Results showed that average oxygen stats were 92%, with time in minutes less than 88% was 3.1 minutes, indicating nighttime oxygen is not needed with bipap. He states he continues to love his bipap, and feels more rested since using it. He comes to the office tomorrow with LE NCV/EMG.  REVIEW OF SYSTEMS: Full 14 system review of systems performed and notable only for: fatigue, trouble swallowing, light sensitivity, shortness of breath, back pain, walking difficulty, headache, leg tremor.  ALLERGIES: Allergies  Allergen Reactions  . Cephalexin     Unknown  . Doxycycline     Unknown  . Keflex [Cephalexin]   . Lac Bovis Other (See Comments)    lactose intolerant  . Pentazocine Lactate     He passed out- he had a seizure.  This occurred around [place]: Outpatient Prescriptions Prior to Visit  Medication Sig Dispense Refill  . atorvastatin (LIPITOR) 80 MG tablet Take 1 tablet (80 mg total) by mouth daily.  30 tablet  11  . FLUoxetine (PROZAC) 10 MG capsule Take 20 mg by mouth daily.      . furosemide (LASIX) 20 MG tablet Take 40 mg by mouth daily.      . nebivolol (BYSTOLIC) 10  MG tablet Take 1 tablet (10 mg total) by mouth 2 (two) times daily.  60 tablet  5  . nitroGLYCERIN (NITROSTAT) 0.4 MG SL tablet Place 1 tablet (0.4 mg total) under the tongue every 5 (five) minutes as needed. For chest pain  25 tablet  6  . omeprazole (PRILOSEC) 20 MG capsule Take 20 mg by mouth 2 (two) times daily before a meal.      . ondansetron (ZOFRAN) 4 MG tablet Take 1 tablet (4 mg total) by mouth every 8 (eight) hours as needed for nausea or vomiting.  20 tablet  0  . phenazopyridine (PYRIDIUM) 200 MG tablet Take 1 tablet (200 mg total) by mouth 3 (three) times daily as needed for pain.  15 tablet  0  . sitaGLIPtin (JANUVIA) 100 MG tablet Take 100 mg by mouth daily.      . traZODone (DESYREL) 150 MG tablet Take 150 mg by mouth at bedtime.      Marland Kitchen  baclofen (LIORESAL) 10 MG tablet Take 1 tablet (10 mg total) by mouth 3 (three) times daily.  30 each  0  . HYDROcodone-acetaminophen (NORCO) 5-325 MG per tablet Take 1 tablet by mouth every 6 (six) hours as needed for moderate pain.  30 tablet  0   No facility-administered medications prior to visit.   [name] DELICA LANCETS FINE MISC    Sig:   . glucose blood (ONE TOUCH ULTRA TEST) test strip    Sig:   . methocarbamol (ROBAXIN) 750 MG tablet    Sig:   . DISCONTD: metoCLOPramide (REGLAN) 5 MG tablet    Sig:   . metroNIDAZOLE (FLAGYL) 500 MG tablet    Sig:     PHYSICAL EXAM Filed Vitals:   05/09/14 0957  BP: 110/68  Pulse: 70  Height: _0  (1.651 m)  Weight: 208 lb (94.348 kg)   Body mass index is 34.61 kg/(m^2).  Generalized: Well developed, in no acute distress  Head: normocephalic and atraumatic. Oropharynx benign  Neck: Supple, no carotid bruits  Cardiac: Regular rate rhythm, no murmur, He has a loop recorder in place.  Musculoskeletal: No deformity   Neurological examination  Mental status: The patient is awake, alert and oriented in all 4 spheres. His immediate and remote memory, attention, language skills and fund  of knowledge are fairly appropriate. There is no evidence of aphasia, agnosia, apraxia or anomia. Speech is clear with normal prosody and enunciation. Thought process is linear but he does have some slowness in thinking and tends to turn to his wife for some of the answers. Mood is constricted and affect is blunted.  Cranial nerves II - XII are as described above under HEENT exam. In addition: shoulder shrug is normal with equal shoulder height noted.  Motor exam: Normal bulk, strength and tone is noted on the R and he does have a 4+/5 degree of weakness in his left lower extremity with hip flexion and knee extension primarily. Reflexes are about 1+ throughout. Fine motor skills are mildly impaired on the left. He does not have any overt dysmetria or intention tremor on cerebellar testing. Sensory exam is intact to light touch, pinprick, vibration and temperature sense in the upper extremities but decrease to a mild degree to pinprick and vibration sense in the distal lower extremities bilaterally.  Gait, station and balance: He stands up slowly and has a slight limp on the right side. He does walk well with his rolling walker.   ASSESSMENT: In summary, JONTUE CRUMPACKER is a very pleasant 68 year old male with an underlying complex medical history of heart disease, hyperlipidemia, obesity, hypertension, history of seizure disorder, history of atrial flutter, depression, chronic back pain, hearing loss, diverticulitis, history of alcohol and prescription medicine abuse, diabetes, reflux disease, recurrent headaches, and stroke x 3, first in the 90s, including a recent embolic stroke with residual right lower extremity weakness, who presents for followup consultation of his OSA, on BiPAP therapy.   He is fully compliant with treatment and is encouraged to continue with treatment and greatly commended for being so compliant. He feels much better with BiPAP therapy and his wife adds that he loves his machine.    PLAN: Patient would like portable oxygen called "[name]" and needs Rx for [place]. I requested that they request this Rx from the MD that prescribed his daytime oxygen. He was placed on continuous O2 during recent hospitalization. He should continue using his bipap every night at  the current settings.  ONO did not demonstrate that he needs nighttime oxygen. He needs supplies for his bipap and he may get those through his DME provider, [facility]. He is encouraged to followup in 6 months from now, he has appointment with Dr. Rexene Alberts in February, sooner if the need arises.  Rudi Rummage LAM, MSN, FNP-BC, A/GNP-C 05/09/2014, 10:13 AM [facility] [address] [place], [place] [address] and [phone]  Note: This document was prepared with digital dictation and possible smart phrase technology. Any transcriptional errors that result from this process are unintentional.  I reviewed the above note and documentation by the Nurse Practitioner and agree with the history, physical exam, assessment and plan as outlined above.  Star Age, MD, PhD [facility])

## 2014-05-09 NOTE — Patient Instructions (Signed)
PLAN: [name] had Overnight Pulse Oxymetry on Bipap completed last week.   Results show:   Patient would like Rx for portable oxygen called "[name]" and needs Rx for [name]. Was placed on continuous O2 during recent hospitalization.  CPAP compliance is wonderful! Keep up the good work!  Keep next follow up appointment with Dr. Rexene Alberts in February for Bipap compliance.

## 2014-05-10 ENCOUNTER — Ambulatory Visit (INDEPENDENT_AMBULATORY_CARE_PROVIDER_SITE_OTHER): Payer: Medicare Other | Admitting: Neurology

## 2014-05-10 ENCOUNTER — Encounter (INDEPENDENT_AMBULATORY_CARE_PROVIDER_SITE_OTHER): Payer: Self-pay

## 2014-05-10 DIAGNOSIS — Z0289 Encounter for other administrative examinations: Secondary | ICD-10-CM

## 2014-05-10 DIAGNOSIS — R262 Difficulty in walking, not elsewhere classified: Secondary | ICD-10-CM

## 2014-05-10 DIAGNOSIS — M5410 Radiculopathy, site unspecified: Secondary | ICD-10-CM

## 2014-05-10 DIAGNOSIS — Z981 Arthrodesis status: Secondary | ICD-10-CM

## 2014-05-10 DIAGNOSIS — G544 Lumbosacral root disorders, not elsewhere classified: Secondary | ICD-10-CM

## 2014-05-10 DIAGNOSIS — R29898 Other symptoms and signs involving the musculoskeletal system: Secondary | ICD-10-CM

## 2014-05-10 NOTE — Procedures (Signed)
     HISTORY:  [name] is a 68 year old gentleman with a history of lumbosacral spine surgery on 2 occasions in the past. The patient reports ongoing low back pain, and pain radiating down both legs into the feet. The patient reports some weakness in the legs, left greater than right. He has sustained a stroke event with a left hemiparesis. The patient is being evaluated for the leg pain and weakness.  NERVE CONDUCTION STUDIES:  Nerve conduction studies were performed on both lower extremities. The distal motor latencies and motor amplitudes for the peroneal and posterior tibial nerves were within normal limits. The nerve conduction velocities for these nerves were also normal. The H reflex latencies were normal. The sensory latencies for the peroneal nerves were within normal limits.   EMG STUDIES:   EMG study was performed on the right lower extremity:  The tibialis anterior muscle reveals 2 to 4K motor units with full recruitment. No fibrillations or positive waves were seen. The peroneus tertius muscle reveals 2 to 4K motor units with full recruitment. One plus positive waves were seen. The medial gastrocnemius muscle reveals 1 to 3K motor units with full recruitment. No fibrillations or positive waves were seen. The vastus lateralis muscle reveals 2 to 4K motor units with full recruitment. One plus positive waves were seen. The iliopsoas muscle reveals 2 to 4K motor units with full recruitment. No fibrillations or positive waves were seen. The biceps femoris muscle (long head) reveals 2 to 4K motor units with full recruitment. No fibrillations or positive waves were seen. The lumbosacral paraspinal muscles were tested at 3 levels, and revealed 1+ positive waves at the upper and middle levels, 2+ positive waves at the lower level. There was good relaxation.  EMG study was performed on the left lower extremity:  The tibialis anterior muscle reveals 2 to 4K motor units with full  recruitment. No fibrillations or positive waves were seen. The peroneus tertius muscle reveals 2 to 4K motor units with full recruitment. No fibrillations or positive waves were seen. The medial gastrocnemius muscle reveals 1 to 3K motor units with full recruitment. No fibrillations or positive waves were seen. The vastus lateralis muscle reveals 2 to 4K motor units with full recruitment. No fibrillations or positive waves were seen. The iliopsoas muscle reveals 2 to 4K motor units with full recruitment. No fibrillations or positive waves were seen. The biceps femoris muscle (long head) reveals 2 to 4K motor units with full recruitment. No fibrillations or positive waves were seen. The lumbosacral paraspinal muscles were tested at 3 levels, and revealed no abnormalities of insertional activity the upper and middle levels tested. 2+ positive waves were seen at the lower level. There was good relaxation.   IMPRESSION:  Nerve conduction studies done on both lower extremities were unremarkable, without evidence of a peripheral neuropathy. EMG evaluation of the right lower extremity shows findings that are most consistent with a low-grade acute S1 radiculopathy, with some involvement as well at the L3 or L4 level. EMG evaluation of the left lower extremity does not show clear evidence of a lumbosacral radiculopathy.  Jill Alexanders MD 05/10/2014 1:56 PM  [facility] [address] [place], [place] [address]  Phone [phone] Fax [phone]

## 2014-05-12 ENCOUNTER — Other Ambulatory Visit: Payer: Self-pay | Admitting: *Deleted

## 2014-05-12 ENCOUNTER — Encounter: Payer: Self-pay | Admitting: Nurse Practitioner

## 2014-05-12 MED ORDER — NITROGLYCERIN 0.4 MG SL SUBL
0.4000 mg | SUBLINGUAL_TABLET | SUBLINGUAL | Status: DC | PRN
Start: 2014-05-12 — End: 2015-03-10

## 2014-05-12 NOTE — Progress Notes (Signed)
Subjective:    Patient ID: [name] is a 68 y.o. male.  HPI   Review of Systems  Objective:  Neurologic Exam  Physical Exam  Assessment:   NCV/EMG on both lower extremities are unremarkable, without evidence of peripheral neuropathy.  Right lower leg shows evidence of mild radiculopathy coming from the lumbar 3-4 spine and Sacral S1 level. Results were released to [place].  Plan:

## 2014-05-13 ENCOUNTER — Telehealth: Payer: Self-pay | Admitting: Nurse Practitioner

## 2014-05-13 NOTE — Telephone Encounter (Signed)
Called patient to give results for NCV/EMG, no answer. Left message that results were released to MyChart and if they would like to call and discuss the results they may.

## 2014-05-13 NOTE — Progress Notes (Signed)
Quick Note:  I reviewed the patient's ONO (overnight pulse oximetry report) from 05/05/2014, while on room air and on BiPAP therapy. His baseline oxygen saturation for the night was 92 % and minimum oxygen saturation was 83 %. A desaturation event was defined as a drop in oxygen saturation by at least 4% for a minimum duration of 10 seconds. Time below 88% saturation was 3.1 minutes. In The average pulse rate was 69.7 bpm and the lowest pulse rate was 58 bpm. Based on the available data, it appears that the patient is appropriately treated on BiPAP only and does not seem to require any additional oxygen at night with BiPAP therapy.  We have reviewed this data with the patient at the recent office visit when he saw Ms. Lam, NP.  Star Age, MD, PhD [facility] ([facility])    ______

## 2014-05-16 ENCOUNTER — Encounter: Payer: Self-pay | Admitting: Neurology

## 2014-05-18 ENCOUNTER — Ambulatory Visit (INDEPENDENT_AMBULATORY_CARE_PROVIDER_SITE_OTHER)
Admission: RE | Admit: 2014-05-18 | Discharge: 2014-05-18 | Disposition: A | Discharge status: Home / Self Care | Payer: Medicare Other | Source: Ambulatory Visit | Attending: Internal Medicine | Admitting: Internal Medicine

## 2014-05-18 ENCOUNTER — Encounter: Payer: Self-pay | Admitting: Internal Medicine

## 2014-05-18 ENCOUNTER — Ambulatory Visit (INDEPENDENT_AMBULATORY_CARE_PROVIDER_SITE_OTHER): Payer: Medicare Other | Admitting: Internal Medicine

## 2014-05-18 ENCOUNTER — Other Ambulatory Visit (INDEPENDENT_AMBULATORY_CARE_PROVIDER_SITE_OTHER): Payer: Medicare Other

## 2014-05-18 VITALS — BP 110/70 | HR 67 | Temp 98.0°F | Ht 66.0 in | Wt 208.0 lb

## 2014-05-18 DIAGNOSIS — R05 Cough: Secondary | ICD-10-CM

## 2014-05-18 DIAGNOSIS — J9610 Chronic respiratory failure, unspecified whether with hypoxia or hypercapnia: Secondary | ICD-10-CM | POA: Diagnosis not present

## 2014-05-18 DIAGNOSIS — R0600 Dyspnea, unspecified: Secondary | ICD-10-CM

## 2014-05-18 DIAGNOSIS — R0609 Other forms of dyspnea: Secondary | ICD-10-CM

## 2014-05-18 DIAGNOSIS — I2510 Atherosclerotic heart disease of native coronary artery without angina pectoris: Secondary | ICD-10-CM

## 2014-05-18 DIAGNOSIS — R0989 Other specified symptoms and signs involving the circulatory and respiratory systems: Secondary | ICD-10-CM | POA: Diagnosis not present

## 2014-05-18 DIAGNOSIS — J920 Pleural plaque with presence of asbestos: Secondary | ICD-10-CM

## 2014-05-18 DIAGNOSIS — T65891A Toxic effect of other specified substances, accidental (unintentional), initial encounter: Secondary | ICD-10-CM

## 2014-05-18 DIAGNOSIS — R091 Pleurisy: Secondary | ICD-10-CM | POA: Diagnosis not present

## 2014-05-18 DIAGNOSIS — R059 Cough, unspecified: Secondary | ICD-10-CM

## 2014-05-18 DIAGNOSIS — R0602 Shortness of breath: Secondary | ICD-10-CM | POA: Diagnosis not present

## 2014-05-18 LAB — CBC WITH DIFFERENTIAL/PLATELET
BASOS PCT: 0.1 % (ref 0.0–3.0)
Basophils Absolute: 0 10*3/uL (ref 0.0–0.1)
EOS PCT: 1.6 % (ref 0.0–5.0)
Eosinophils Absolute: 0.1 10*3/uL (ref 0.0–0.7)
HCT: 36.5 % — ABNORMAL LOW (ref 39.0–52.0)
HEMOGLOBIN: 12.1 g/dL — AB (ref 13.0–17.0)
Lymphocytes Relative: 15.7 % (ref 12.0–46.0)
Lymphs Abs: 1.2 10*3/uL (ref 0.7–4.0)
MCHC: 33 g/dL (ref 30.0–36.0)
MCV: 97.7 fl (ref 78.0–100.0)
MONO ABS: 0.8 10*3/uL (ref 0.1–1.0)
MONOS PCT: 10.4 % (ref 3.0–12.0)
NEUTROS ABS: 5.3 10*3/uL (ref 1.4–7.7)
Neutrophils Relative %: 72.2 % (ref 43.0–77.0)
Platelets: 209 10*3/uL (ref 150.0–400.0)
RBC: 3.73 Mil/uL — ABNORMAL LOW (ref 4.22–5.81)
RDW: 13.8 % (ref 11.5–15.5)
WBC: 7.4 10*3/uL (ref 4.0–10.5)

## 2014-05-18 LAB — BASIC METABOLIC PANEL
BUN: 19 mg/dL (ref 6–23)
CO2: 30 meq/L (ref 19–32)
Calcium: 9.4 mg/dL (ref 8.4–10.5)
Chloride: 103 mEq/L (ref 96–112)
Creatinine, Ser: 1 mg/dL (ref 0.4–1.5)
GFR: 81.3 mL/min (ref >60.00)
Glucose, Bld: 114 mg/dL — ABNORMAL HIGH (ref 70–99)
POTASSIUM: 4.2 meq/L (ref 3.5–5.1)
Sodium: 138 mEq/L (ref 135–145)

## 2014-05-18 LAB — TSH: TSH: 2.03 u[IU]/mL (ref 0.35–4.50)

## 2014-05-18 LAB — BRAIN NATRIURETIC PEPTIDE: PRO B NATRI PEPTIDE: 69 pg/mL (ref 0.0–100.0)

## 2014-05-18 NOTE — Progress Notes (Signed)
Quick Note:  LMTCB ______ 

## 2014-05-18 NOTE — Patient Instructions (Addendum)
Prilosec Take 30- 60 min before your first and last meals of the day   GERD (REFLUX)  is an extremely common cause of respiratory symptoms, many times with no significant heartburn at all.    It can be treated with medication, but also with lifestyle changes including avoidance of late meals, excessive alcohol, smoking cessation, and avoid fatty foods, chocolate, peppermint, colas, red wine, and acidic juices such as orange juice.  NO MINT OR MENTHOL PRODUCTS SO NO COUGH DROPS  USE SUGARLESS CANDY INSTEAD (jolley ranchers or Stover's)  NO OIL BASED VITAMINS - use powdered substitutes.   Keep your appt for your pfts   Please remember to go to the lab and x-ray department downstairs for your tests - we will call you with the results when they are available.

## 2014-05-18 NOTE — Progress Notes (Signed)
Patient ID: [name], male    DOB: Dec 02, 1945  MRN: [id number]    Brief patient profile:  10 yowm never smoker with obesity and osa/appliance dep with h/o ? Asthma as child and then needed inhalers with colds only  since 2004 referred by Dr Cleta Alberts for eval of sob x 2013 after eval by Susa Simmonds for hiccups in 2014 dx'd with ? asbestosis  History of Present Illness  02/11/2013 1st pulmonary eval cc indolent onset progressive doe esp out in hot weather x one year with some dry cough on hot air exposure and even indoors has to stop about 150 yards nl pace with stress test by [facility] March 2014 with nl ef, low risk IHD.  Has used inhalers in past, but not in past year  Has noted some assoc with  swelling but no orthopnea.   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  >>stop metoprolol and ARB , rx bystolic      03/24/2013 f/u ov/Arval Brandstetter re sob Chief Complaint  Patient presents with  . Follow-up    Pt states that his breathing is unchanged since the last visit. He c/o increased non prod cough x 1 wk- seems worse with humid weather and in the late evening.   [facility], leg swelling resolved and kidney fx better but breathing no better in hot weather, does fine walking flat in cool climate like a mall. >no change rx  05/04/2013 Follow up and med review  new med calendar - pt brought all meds with him today.  reports breathing somewhat better since last ov.   We reviewed all his medications and organized them into a patient medication calendar with patient education. rec  no change rx   06/09/2013 f/u ov/Aala Ransom re: doe improving  Chief Complaint  Patient presents with  . Follow-up    Pt reports breathing is overall doing better. No new co's today.     Also dry cough for year daytime not noct sev times per day "like a sneeze"  rec For cough as needed use delsym as needed  GERD diet      08/10/2013  Acute  ov/Zarria Towell re: chronic cough  Chief Complaint  Patient  presents with  . Acute Visit    Pt c/o cough x 1 wk-worse for the past 2 days. Cough is prod cough with very minimal clear sputum. Cough is esp worse at night and keeps him awake.   Was better better but cough never went  Completely away-  always coughs at  bedtime  rec Prednisone 10 mg take  4 each am x 2 days,   2 each am x 2 days,  1 each am x 2 days and stop (called in) Add chlortrimeton 4 mg one at bedtime (over the counter) Take delsym two tsp every 12 hours and supplement if needed with Tylenol #3 1 every 4 hours to suppress the urge to cough. Swallowing water or using ice chips/non mint and menthol containing candies (such as lifesavers or sugarless jolly ranchers) are also effective.  You should rest your voice and avoid activities that you know make you cough. Once you have eliminated the cough for 3 straight days try reducing the tylenol #3 first,  then the delsym as tolerated.   GERD diet     09/08/2013 f/u ov/Darcie Mellone re: cough/ no med calendar Chief Complaint  Patient presents with  . Follow-up    Cough is much improved since last visit.  No new co's today.   cough only deep breath in - not limited by sob from desired activities rec No change rx Return for pfts 05/2014 for pfts   05/18/2014 extended "re-establish" ov/Taren Toops re: unexplained hypoxemia/ fits of sob with loss of voice  Chief Complaint  Patient presents with  . Follow-up    Pt c/o increased SOB for the past several months. He was started on supplemental o2 in Aug 2015 during hospital admission.  He states that he gets SOB with walking any distance or even just at rest.   onset of hiccups x one year followd by gradually worsening  cough fits mostly daytime assoc with brief inability to speak lasting sev min then resolves with sob just as likely at rest as with ex Brings up [place] mucus esp at hs on a bipap machine which to help sleep, no worse in am Has not tried inhalers  On max acid suppression per Dr Randa Evens but not  diet  [facility], wife now present saying our med sheet is correct but his reporting of previous symptoms is not (he says he was better last ov, she says not). Not clear why hasn't returned in 8 m but apparently dealing with stroke sequelae No swallowing eval in emr   On bipap per neuro "helps a lot" per pt but wife says still waking freq  No obvious day to day or daytime variabilty or excess or purulent mucus production or sob/ cp or chest tightness, subjective wheeze overt sinus or hb symptoms. No unusual exp hx or h/o childhood pna/ asthma or knowledge of premature birth.  Sleeping ok without nocturnal  or early am exacerbation  of respiratory  c/o's or need for noct saba. Also denies any obvious fluctuation of symptoms with weather or environmental changes or other aggravating or alleviating factors except as outlined above   Current Medications, Allergies, Complete Past Medical History, Past Surgical History, Family History, and Social History were reviewed in [employer] record.  ROS  The following are not active complaints unless bolded sore throat, dysphagia, dental problems, itching, sneezing,  nasal congestion or excess/ purulent secretions, ear ache,   fever, chills, sweats, unintended wt loss, pleuritic or exertional cp, hemoptysis,  orthopnea pnd or leg swelling, presyncope, palpitations, heartburn, abdominal pain, anorexia, nausea, vomiting, diarrhea  or change in bowel or urinary habits, change in stools or urine, dysuria,hematuria,  rash, arthralgias, visual complaints, headache, numbness weakness or ataxia or problems with walking or coordination,  change in mood/affect or memory.              Objective:   Physical Exam  amb obese wm nad  08/10/2013     213 > 09/08/2013 214 > 05/18/2014  208  Wt Readings from Last 3 Encounters:  06/09/13 218 lb (98.884 kg)  05/04/13 223 lb 9.6 oz (101.424 kg)  04/28/13 222 lb 12.8 oz (101.061 kg)         HEENT: nl dentition, turbinates, and orophanx. Nl external ear canals without cough reflex   NECK :  without JVD/Nodes/TM/ nl carotid upstrokes bilaterally   LUNGS: no acc muscle use, clear to A and P bilaterally with cough on deep insp/ no stridor   CV:  RRR  no s3 or murmur or increase in P2,  Tr -bilateral ankle edema   ABD:  Obese soft and nontender with nl excursion in the supine position. No bruits or organomegaly, bowel sounds nl  MS:  warm without  deformities, calf tenderness, cyanosis or clubbing  SKIN: warm and dry without lesions        CXR  05/18/2014 :  Heart size and pulmonary vascularity are normal. CABG. Calcification in the arch of the aorta. Multiple pleural calcifications bilaterally, stable.  No infiltrates or effusions. No acute osseous abnormality.     Recent Labs Lab 05/18/14 1332  NA 138  K 4.2  CL 103  CO2 30  BUN 19  CREATININE 1.0  GLUCOSE 114*    Recent Labs Lab 05/18/14 1332  HGB 12.1*  HCT 36.5*  WBC 7.4  PLT 209.0     Lab Results  Component Value Date   TSH 2.03 05/18/2014     Lab Results  Component Value Date   PROBNP 69.0 05/18/2014    Lab Results  Component Value Date   DDIMER 2.02* 05/18/2014          Assessment & Plan:

## 2014-05-19 ENCOUNTER — Encounter (HOSPITAL_COMMUNITY): Payer: Self-pay | Admitting: Emergency Medicine

## 2014-05-19 ENCOUNTER — Ambulatory Visit (INDEPENDENT_AMBULATORY_CARE_PROVIDER_SITE_OTHER)
Admission: RE | Admit: 2014-05-19 | Discharge: 2014-05-19 | Disposition: A | Discharge status: Home / Self Care | Payer: Medicare Other | Source: Ambulatory Visit | Attending: Internal Medicine | Admitting: Internal Medicine

## 2014-05-19 ENCOUNTER — Ambulatory Visit (HOSPITAL_COMMUNITY): Payer: Medicare Other

## 2014-05-19 ENCOUNTER — Telehealth: Payer: Self-pay | Admitting: Internal Medicine

## 2014-05-19 ENCOUNTER — Ambulatory Visit (INDEPENDENT_AMBULATORY_CARE_PROVIDER_SITE_OTHER): Payer: Medicare Other | Admitting: *Deleted

## 2014-05-19 ENCOUNTER — Emergency Department (HOSPITAL_COMMUNITY): Payer: Medicare Other

## 2014-05-19 ENCOUNTER — Encounter: Payer: Self-pay | Admitting: Internal Medicine

## 2014-05-19 ENCOUNTER — Ambulatory Visit (HOSPITAL_COMMUNITY)
Admission: RE | Admit: 2014-05-19 | Discharge: 2014-05-19 | Disposition: A | Discharge status: Home / Self Care | Payer: Medicare Other | Source: Ambulatory Visit | Attending: Internal Medicine | Admitting: Internal Medicine

## 2014-05-19 ENCOUNTER — Inpatient Hospital Stay (HOSPITAL_COMMUNITY)
Admission: EM | Admit: 2014-05-19 | Discharge: 2014-05-25 | DRG: 175 | Disposition: A | Discharge status: Home / Self Care | Payer: Medicare Other | Attending: Pulmonary Disease | Admitting: Pulmonary Disease

## 2014-05-19 DIAGNOSIS — R059 Cough, unspecified: Secondary | ICD-10-CM

## 2014-05-19 DIAGNOSIS — I2699 Other pulmonary embolism without acute cor pulmonale: Principal | ICD-10-CM

## 2014-05-19 DIAGNOSIS — R0609 Other forms of dyspnea: Secondary | ICD-10-CM | POA: Diagnosis not present

## 2014-05-19 DIAGNOSIS — Z951 Presence of aortocoronary bypass graft: Secondary | ICD-10-CM

## 2014-05-19 DIAGNOSIS — M5410 Radiculopathy, site unspecified: Secondary | ICD-10-CM

## 2014-05-19 DIAGNOSIS — R05 Cough: Secondary | ICD-10-CM | POA: Diagnosis not present

## 2014-05-19 DIAGNOSIS — K219 Gastro-esophageal reflux disease without esophagitis: Secondary | ICD-10-CM | POA: Diagnosis present

## 2014-05-19 DIAGNOSIS — Z8669 Personal history of other diseases of the nervous system and sense organs: Secondary | ICD-10-CM

## 2014-05-19 DIAGNOSIS — I517 Cardiomegaly: Secondary | ICD-10-CM | POA: Diagnosis not present

## 2014-05-19 DIAGNOSIS — K224 Dyskinesia of esophagus: Secondary | ICD-10-CM | POA: Insufficient documentation

## 2014-05-19 DIAGNOSIS — Z8673 Personal history of transient ischemic attack (TIA), and cerebral infarction without residual deficits: Secondary | ICD-10-CM | POA: Diagnosis not present

## 2014-05-19 DIAGNOSIS — I10 Essential (primary) hypertension: Secondary | ICD-10-CM | POA: Diagnosis present

## 2014-05-19 DIAGNOSIS — I639 Cerebral infarction, unspecified: Secondary | ICD-10-CM

## 2014-05-19 DIAGNOSIS — H9190 Unspecified hearing loss, unspecified ear: Secondary | ICD-10-CM | POA: Diagnosis present

## 2014-05-19 DIAGNOSIS — J9610 Chronic respiratory failure, unspecified whether with hypoxia or hypercapnia: Secondary | ICD-10-CM | POA: Insufficient documentation

## 2014-05-19 DIAGNOSIS — F3289 Other specified depressive episodes: Secondary | ICD-10-CM | POA: Diagnosis present

## 2014-05-19 DIAGNOSIS — F32A Depression, unspecified: Secondary | ICD-10-CM

## 2014-05-19 DIAGNOSIS — J9620 Acute and chronic respiratory failure, unspecified whether with hypoxia or hypercapnia: Secondary | ICD-10-CM | POA: Diagnosis present

## 2014-05-19 DIAGNOSIS — R0600 Dyspnea, unspecified: Secondary | ICD-10-CM

## 2014-05-19 DIAGNOSIS — J9601 Acute respiratory failure with hypoxia: Secondary | ICD-10-CM

## 2014-05-19 DIAGNOSIS — N201 Calculus of ureter: Secondary | ICD-10-CM

## 2014-05-19 DIAGNOSIS — F329 Major depressive disorder, single episode, unspecified: Secondary | ICD-10-CM | POA: Diagnosis present

## 2014-05-19 DIAGNOSIS — F1011 Alcohol abuse, in remission: Secondary | ICD-10-CM

## 2014-05-19 DIAGNOSIS — R0989 Other specified symptoms and signs involving the circulatory and respiratory systems: Secondary | ICD-10-CM

## 2014-05-19 DIAGNOSIS — E119 Type 2 diabetes mellitus without complications: Secondary | ICD-10-CM | POA: Diagnosis not present

## 2014-05-19 DIAGNOSIS — N23 Unspecified renal colic: Secondary | ICD-10-CM

## 2014-05-19 DIAGNOSIS — I6350 Cerebral infarction due to unspecified occlusion or stenosis of unspecified cerebral artery: Secondary | ICD-10-CM

## 2014-05-19 DIAGNOSIS — R791 Abnormal coagulation profile: Secondary | ICD-10-CM | POA: Diagnosis not present

## 2014-05-19 DIAGNOSIS — J3489 Other specified disorders of nose and nasal sinuses: Secondary | ICD-10-CM | POA: Diagnosis not present

## 2014-05-19 DIAGNOSIS — E785 Hyperlipidemia, unspecified: Secondary | ICD-10-CM | POA: Diagnosis present

## 2014-05-19 DIAGNOSIS — G40909 Epilepsy, unspecified, not intractable, without status epilepticus: Secondary | ICD-10-CM

## 2014-05-19 DIAGNOSIS — N19 Unspecified kidney failure: Secondary | ICD-10-CM

## 2014-05-19 DIAGNOSIS — F1010 Alcohol abuse, uncomplicated: Secondary | ICD-10-CM | POA: Diagnosis present

## 2014-05-19 DIAGNOSIS — I6340 Cerebral infarction due to embolism of unspecified cerebral artery: Secondary | ICD-10-CM | POA: Diagnosis not present

## 2014-05-19 DIAGNOSIS — J9600 Acute respiratory failure, unspecified whether with hypoxia or hypercapnia: Secondary | ICD-10-CM | POA: Diagnosis not present

## 2014-05-19 DIAGNOSIS — R1310 Dysphagia, unspecified: Secondary | ICD-10-CM

## 2014-05-19 DIAGNOSIS — R091 Pleurisy: Secondary | ICD-10-CM | POA: Diagnosis present

## 2014-05-19 DIAGNOSIS — Z9981 Dependence on supplemental oxygen: Secondary | ICD-10-CM

## 2014-05-19 DIAGNOSIS — I4892 Unspecified atrial flutter: Secondary | ICD-10-CM | POA: Diagnosis present

## 2014-05-19 DIAGNOSIS — Z8679 Personal history of other diseases of the circulatory system: Secondary | ICD-10-CM

## 2014-05-19 DIAGNOSIS — I2510 Atherosclerotic heart disease of native coronary artery without angina pectoris: Secondary | ICD-10-CM | POA: Diagnosis not present

## 2014-05-19 DIAGNOSIS — I69359 Hemiplegia and hemiparesis following cerebral infarction affecting unspecified side: Secondary | ICD-10-CM

## 2014-05-19 DIAGNOSIS — D649 Anemia, unspecified: Secondary | ICD-10-CM | POA: Diagnosis present

## 2014-05-19 DIAGNOSIS — J920 Pleural plaque with presence of asbestos: Secondary | ICD-10-CM

## 2014-05-19 DIAGNOSIS — Z9889 Other specified postprocedural states: Secondary | ICD-10-CM

## 2014-05-19 DIAGNOSIS — G4733 Obstructive sleep apnea (adult) (pediatric): Secondary | ICD-10-CM

## 2014-05-19 DIAGNOSIS — E669 Obesity, unspecified: Secondary | ICD-10-CM

## 2014-05-19 DIAGNOSIS — E876 Hypokalemia: Secondary | ICD-10-CM | POA: Diagnosis present

## 2014-05-19 DIAGNOSIS — I69328 Other speech and language deficits following cerebral infarction: Secondary | ICD-10-CM

## 2014-05-19 DIAGNOSIS — H9193 Unspecified hearing loss, bilateral: Secondary | ICD-10-CM

## 2014-05-19 DIAGNOSIS — R0602 Shortness of breath: Secondary | ICD-10-CM | POA: Diagnosis not present

## 2014-05-19 DIAGNOSIS — I2583 Coronary atherosclerosis due to lipid rich plaque: Secondary | ICD-10-CM

## 2014-05-19 LAB — CBC WITH DIFFERENTIAL/PLATELET
BASOS ABS: 0 10*3/uL (ref 0.0–0.1)
Basophils Relative: 0 % (ref 0–1)
EOS ABS: 0.1 10*3/uL (ref 0.0–0.7)
EOS PCT: 2 % (ref 0–5)
HCT: 34.2 % — ABNORMAL LOW (ref 39.0–52.0)
Hemoglobin: 11.7 g/dL — ABNORMAL LOW (ref 13.0–17.0)
Lymphocytes Relative: 25 % (ref 12–46)
Lymphs Abs: 1.9 10*3/uL (ref 0.7–4.0)
MCH: 31.8 pg (ref 26.0–34.0)
MCHC: 34.2 g/dL (ref 30.0–36.0)
MCV: 92.9 fL (ref 78.0–100.0)
Monocytes Absolute: 0.7 10*3/uL (ref 0.1–1.0)
Monocytes Relative: 9 % (ref 3–12)
Neutro Abs: 5 10*3/uL (ref 1.7–7.7)
Neutrophils Relative %: 64 % (ref 43–77)
Platelets: 206 10*3/uL (ref 150–400)
RBC: 3.68 MIL/uL — ABNORMAL LOW (ref 4.22–5.81)
RDW: 13.2 % (ref 11.5–15.5)
WBC: 7.8 10*3/uL (ref 4.0–10.5)

## 2014-05-19 LAB — BASIC METABOLIC PANEL
Anion gap: 16 — ABNORMAL HIGH (ref 5–15)
BUN: 20 mg/dL (ref 6–23)
CALCIUM: 9.5 mg/dL (ref 8.4–10.5)
CO2: 25 mEq/L (ref 19–32)
CREATININE: 0.82 mg/dL (ref 0.50–1.35)
Chloride: 96 mEq/L (ref 96–112)
GFR, EST NON AFRICAN AMERICAN: 88 mL/min — AB (ref >90)
Glucose, Bld: 87 mg/dL (ref 70–99)
POTASSIUM: 3.5 meq/L — AB (ref 3.7–5.3)
Sodium: 137 mEq/L (ref 137–147)

## 2014-05-19 LAB — PRO B NATRIURETIC PEPTIDE: Pro B Natriuretic peptide (BNP): 192.3 pg/mL — ABNORMAL HIGH (ref 0–125)

## 2014-05-19 LAB — GLUCOSE, CAPILLARY: GLUCOSE-CAPILLARY: 102 mg/dL — AB (ref 70–99)

## 2014-05-19 LAB — PROTIME-INR
INR: 1.16 (ref 0.00–1.49)
Prothrombin Time: 14.8 seconds (ref 11.6–15.2)

## 2014-05-19 LAB — MRSA PCR SCREENING: MRSA by PCR: NEGATIVE

## 2014-05-19 LAB — D-DIMER, QUANTITATIVE: D-Dimer, Quant: 2.02 ug/mL-FEU — ABNORMAL HIGH (ref 0.00–0.48)

## 2014-05-19 LAB — APTT: aPTT: 163 seconds — ABNORMAL HIGH (ref 24–37)

## 2014-05-19 MED ORDER — SODIUM CHLORIDE 0.9 % IV SOLN: 250.0000 mL | INTRAVENOUS | Status: DC | PRN

## 2014-05-19 MED ORDER — BENZONATATE 100 MG PO CAPS
200.0000 mg | ORAL_CAPSULE | Freq: Two times a day (BID) | ORAL | Status: DC | PRN
  Filled 2014-05-19: qty 2

## 2014-05-19 MED ORDER — WARFARIN - PHARMACIST DOSING INPATIENT
Freq: Every day | Status: DC
  Administered 2014-05-19 – 2014-05-23 (×2)

## 2014-05-19 MED ORDER — TRAZODONE HCL 150 MG PO TABS
150.0000 mg | ORAL_TABLET | Freq: Every day | ORAL | Status: DC
  Administered 2014-05-19 – 2014-05-24 (×5): 150 mg via ORAL
  Filled 2014-05-19 (×7): qty 1

## 2014-05-19 MED ORDER — METHOCARBAMOL 750 MG PO TABS
750.0000 mg | ORAL_TABLET | Freq: Three times a day (TID) | ORAL | Status: DC
  Administered 2014-05-19 – 2014-05-25 (×17): 750 mg via ORAL
  Filled 2014-05-19 (×21): qty 1

## 2014-05-19 MED ORDER — WARFARIN VIDEO
Freq: Once | Status: AC
Start: 2014-05-19 — End: 2014-05-19
  Administered 2014-05-19: 20:00:00

## 2014-05-19 MED ORDER — NEBIVOLOL HCL 10 MG PO TABS
10.0000 mg | ORAL_TABLET | Freq: Two times a day (BID) | ORAL | Status: DC
  Administered 2014-05-19 – 2014-05-25 (×12): 10 mg via ORAL
  Filled 2014-05-19 (×15): qty 1

## 2014-05-19 MED ORDER — INSULIN ASPART 100 UNIT/ML ~~LOC~~ SOLN
0.0000 [IU] | Freq: Three times a day (TID) | SUBCUTANEOUS | Status: DC
  Administered 2014-05-20 – 2014-05-25 (×6): 2 [IU] via SUBCUTANEOUS

## 2014-05-19 MED ORDER — PANTOPRAZOLE SODIUM 40 MG PO TBEC
40.0000 mg | DELAYED_RELEASE_TABLET | Freq: Every day | ORAL | Status: DC
  Administered 2014-05-20 – 2014-05-25 (×6): 40 mg via ORAL
  Filled 2014-05-19 (×5): qty 1

## 2014-05-19 MED ORDER — COUMADIN BOOK
Freq: Once | Status: AC
Start: 2014-05-19 — End: 2014-05-19
  Administered 2014-05-19: 22:00:00
  Filled 2014-05-19 (×2): qty 1

## 2014-05-19 MED ORDER — IOHEXOL 350 MG/ML SOLN
80.0000 mL | Freq: Once | INTRAVENOUS | Status: AC | PRN
  Administered 2014-05-19: 80 mL via INTRAVENOUS

## 2014-05-19 MED ORDER — HEPARIN (PORCINE) IN NACL 100-0.45 UNIT/ML-% IJ SOLN
1000.0000 [IU]/h | INTRAMUSCULAR | Status: DC
  Administered 2014-05-19: 1400 [IU]/h via INTRAVENOUS
  Administered 2014-05-20: 1250 [IU]/h via INTRAVENOUS
  Administered 2014-05-21: 1150 [IU]/h via INTRAVENOUS
  Administered 2014-05-22 – 2014-05-24 (×3): 1000 [IU]/h via INTRAVENOUS
  Filled 2014-05-19 (×10): qty 250

## 2014-05-19 MED ORDER — FLUOXETINE HCL 20 MG PO CAPS
20.0000 mg | ORAL_CAPSULE | Freq: Every day | ORAL | Status: DC
  Administered 2014-05-20 – 2014-05-25 (×6): 20 mg via ORAL
  Filled 2014-05-19 (×6): qty 1

## 2014-05-19 MED ORDER — ATORVASTATIN CALCIUM 80 MG PO TABS
80.0000 mg | ORAL_TABLET | Freq: Every day | ORAL | Status: DC
  Administered 2014-05-19 – 2014-05-24 (×6): 80 mg via ORAL
  Filled 2014-05-19 (×7): qty 1

## 2014-05-19 MED ORDER — ASPIRIN 81 MG PO CHEW
324.0000 mg | CHEWABLE_TABLET | ORAL | Status: AC
  Filled 2014-05-19: qty 4

## 2014-05-19 MED ORDER — INSULIN ASPART 100 UNIT/ML ~~LOC~~ SOLN: 0.0000 [IU] | Freq: Every day | SUBCUTANEOUS | Status: DC

## 2014-05-19 MED ORDER — ASPIRIN 300 MG RE SUPP
300.0000 mg | RECTAL | Status: AC
  Filled 2014-05-19: qty 1

## 2014-05-19 MED ORDER — HEPARIN BOLUS VIA INFUSION
5000.0000 [IU] | Freq: Once | INTRAVENOUS | Status: AC
  Administered 2014-05-19: 5000 [IU] via INTRAVENOUS
  Filled 2014-05-19: qty 5000

## 2014-05-19 MED ORDER — LINAGLIPTIN 5 MG PO TABS
5.0000 mg | ORAL_TABLET | Freq: Every day | ORAL | Status: DC
  Administered 2014-05-20 – 2014-05-25 (×6): 5 mg via ORAL
  Filled 2014-05-19 (×6): qty 1

## 2014-05-19 MED ORDER — WARFARIN SODIUM 7.5 MG PO TABS
7.5000 mg | ORAL_TABLET | Freq: Once | ORAL | Status: AC
  Administered 2014-05-19: 7.5 mg via ORAL
  Filled 2014-05-19 (×2): qty 1

## 2014-05-19 MED ORDER — PANTOPRAZOLE SODIUM 40 MG PO TBEC: 40.0000 mg | DELAYED_RELEASE_TABLET | Freq: Every day | ORAL | Status: DC

## 2014-05-19 NOTE — Progress Notes (Signed)
Quick Note:  See PN dated 05/19/14 ______

## 2014-05-19 NOTE — H&P (Signed)
PULMONARY / CRITICAL CARE MEDICINE   Name: [name] MRN: [id number] DOB: July 19, 1946    ADMISSION DATE:  05/19/2014 CONSULTATION DATE:  05/19/2014  REFERRING MD :  [name]  CHIEF COMPLAINT:  Dyspnea  INITIAL PRESENTATION: 68 y/o male with known CAD admitted on 9/24 with bilateral pulmonary embolism.  STUDIES:  9/24 CT chest >> PE in RUL, RML and bilateral lower lobes; RV:LV 1:1.13  SIGNIFICANT EVENTS:   HISTORY OF PRESENT ILLNESS:  This is a pleasant 68 y/o male who saw Dr. [name] today for shortness of breath.  He notes ongoing dyspnea and cough with [place]z mucus production for several years, but in the last month he has had increased dyspnea even at rest. Notably, he was hospitalized for 3 days one month ago for a kidney stone.  He was discharged home on oxygen with a presumptive diagnosis of asbestosis.  He went to Dr. [name] today for the dyspnea.  A CT angiogram was ordered and he was found to have a pulmonary embolism.  PAST MEDICAL HISTORY :  Past Medical History  Diagnosis Date  . Coronary artery disease     a. Low level exercise Lex MV 3/14: low risk, EF 62%, inf defect 2/2 diaph attenuation vs artifiact, small area of scar possible, no ischemia  . Hyperlipidemia   . Hypertension   . History of seizure disorder   . History of atrial flutter   . Depression   . Back pain     persistent  . Hearing loss   . Diverticulitis   . H/O alcohol abuse   . Diabetes mellitus     controled by diet  . GERD (gastroesophageal reflux disease)   . Seizures   . Headache(784.0)   . Arthritis     of spine  . Sleep apnea     does not use CPAP  . Hx of echocardiogram     Echo 7/14:  Mod LVH, EF 55-60%, Gr 1 DD, mild MR, PASP 36  . AKI (acute kidney injury)     a. 6/14: resolved after d/c ARB;  b. RA U/S 7/14: no RA stenosis  . Atrial flutter   . Stroke   . Shortness of breath   . Renal stone 04/15/2014   Past Surgical History  Procedure Laterality Date  . Partial colectomy      for  diverticuli  . Orchiectomy    . Umbilical hernia repair    . Cervical fusion    . Coronary artery bypass graft  2005    LIMA graft to LAD,saphenous vein graft to diag.,circumflex, marginal,and to the RCA  . Cardiac catheterization    . Appendectomy    . Esophagogastroduodenoscopy  02/10/2012    Procedure: ESOPHAGOGASTRODUODENOSCOPY (EGD);  Surgeon: Cleotis Nipper, MD;  Location: [facility] ENDOSCOPY;  Service: Endoscopy;  Laterality: N/A;  . Foreign body removal  02/10/2012    Procedure: FOREIGN BODY REMOVAL;  Surgeon: Cleotis Nipper, MD;  Location: [place];  Service: Endoscopy;  Laterality: N/A;  . Back surgery    . Esophagogastroduodenoscopy  06/29/2012    Procedure: ESOPHAGOGASTRODUODENOSCOPY (EGD);  Surgeon: Winfield Cunas., MD;  Location: [facility] ENDOSCOPY;  Service: Endoscopy;  Laterality: N/A;  . Foreign body removal  06/29/2012    Procedure: FOREIGN BODY REMOVAL;  Surgeon: Winfield Cunas., MD;  Location: [facility] ENDOSCOPY;  Service: Endoscopy;  Laterality: N/A;  . Savory dilation  06/29/2012    Procedure: SAVORY DILATION;  Surgeon: Winfield Cunas., MD;  Location: [facility] ENDOSCOPY;  Service: Endoscopy;  Laterality: N/A;  . Tee without cardioversion N/A 11/17/2013    Procedure: TRANSESOPHAGEAL ECHOCARDIOGRAM (TEE);  Surgeon: Larey Dresser, MD;  Location: [place];  Service: Cardiovascular;  Laterality: N/A;  . Cystoscopy/retrograde/ureteroscopy/stone extraction with basket Left 04/16/2014    Procedure: CYSTOSCOPY/RETROGRADE/LEFT URETEROSCOPY/STONE EXTRACTION WITH BASKET AND LASER LITHOTRIPSY;  Surgeon: Malka So, MD;  Location: [facility] ORS;  Service: Urology;  Laterality: Left;  With STENT   Prior to Admission medications   Medication Sig Start Date End Date Taking? Authorizing Provider  atorvastatin (LIPITOR) 80 MG tablet Take 1 tablet (80 mg total) by mouth daily. 10/29/13   Gay Filler Copland, MD  FLUoxetine (PROZAC) 10 MG capsule Take 20 mg by mouth daily.    Historical Provider, MD   furosemide (LASIX) 20 MG tablet Take 40 mg by mouth daily.    Historical Provider, MD  glucose blood (ONE TOUCH ULTRA TEST) test strip  03/15/14   Historical Provider, MD  methocarbamol (ROBAXIN) 750 MG tablet  04/10/14   Historical Provider, MD  metroNIDAZOLE (FLAGYL) 500 MG tablet Take 500 mg by mouth every 7 (seven) days.  03/29/14   Historical Provider, MD  nebivolol (BYSTOLIC) 10 MG tablet Take 1 tablet (10 mg total) by mouth 2 (two) times daily.    Tanda Rockers, MD  nitroGLYCERIN (NITROSTAT) 0.4 MG SL tablet Place 1 tablet (0.4 mg total) under the tongue every 5 (five) minutes as needed. For chest pain 05/12/14   Peter M Martinique, MD  omeprazole (PRILOSEC) 20 MG capsule Take 40 mg by mouth 2 (two) times daily before a meal.     Historical Provider, MD  [place]  03/14/14   Historical Provider, MD  sitaGLIPtin (JANUVIA) 100 MG tablet Take 100 mg by mouth daily.    Historical Provider, MD  traZODone (DESYREL) 150 MG tablet Take 150 mg by mouth at bedtime.    Historical Provider, MD   Allergies  Allergen Reactions  . Cephalexin     Unknown  . Doxycycline     Unknown  . Keflex [Cephalexin]   . Lac Bovis Other (See Comments)    lactose intolerant  . Pentazocine Lactate     He passed out- he had a seizure.  This occurred around 2000    FAMILY HISTORY:  Family History  Problem Relation Age of Onset  . Emphysema Mother     was a smoker  . Asthma Mother   . Heart disease Father   . Prostate cancer Paternal Grandfather   . Pancreatic cancer Paternal Uncle    SOCIAL HISTORY:  reports that he has never smoked. He has never used smokeless tobacco. He reports that he does not drink alcohol or use illicit drugs.  REVIEW OF SYSTEMS:   Gen: Denies fever, chills, weight change, + fatigue, night sweats HEENT: Denies blurred vision, double vision, hearing loss, tinnitus, sinus congestion, rhinorrhea, sore throat, neck stiffness, dysphagia PULM: per HPI CV: Denies chest  pain, edema, orthopnea, paroxysmal nocturnal dyspnea, palpitations GI: Denies abdominal pain, nausea, vomiting, diarrhea, hematochezia, melena, constipation, change in bowel habits GU: Denies dysuria, hematuria, polyuria, oliguria, urethral discharge Endocrine: Denies hot or cold intolerance, polyuria, polyphagia or appetite change Derm: Denies rash, dry skin, scaling or peeling skin change Heme: Denies easy bruising, bleeding, bleeding gums Neuro: Denies headache, numbness, weakness, slurred speech, loss of memory or consciousness    SUBJECTIVE:   VITAL SIGNS: Temp:  [97.9 F (36.6 C)] 97.9 F (  36.6 C) (09/24 1559) Pulse Rate:  [64] 64 (09/24 1559) Resp:  [20] 20 (09/24 1559) BP: (109)/(62) 109/62 mmHg (09/24 1559) SpO2:  [100 %] 100 % (09/24 1559) HEMODYNAMICS:   VENTILATOR SETTINGS:   INTAKE / OUTPUT: No intake or output data in the 24 hours ending 05/19/14 1644  PHYSICAL EXAMINATION: General:  Chronically ill appearing, resting comfortably Neuro:  Alert and oriented x4, maew HEENT:  NCAT, PERRL Cardiovascular:  RRR, no mgr Lungs:  CTA B Abdomen:  BS+, soft, nontender Musculoskeletal:  Normal bulk and tone Skin:  Dry, intact  LABS:  CBC  Recent Labs Lab 05/18/14 1332  WBC 7.4  HGB 12.1*  HCT 36.5*  PLT 209.0   Coag's No results found for this basename: APTT, INR,  in the last 168 hours BMET  Recent Labs Lab 05/18/14 1332  NA 138  K 4.2  CL 103  CO2 30  BUN 19  CREATININE 1.0  GLUCOSE 114*   Electrolytes  Recent Labs Lab 05/18/14 1332  CALCIUM 9.4   Sepsis Markers No results found for this basename: LATICACIDVEN, PROCALCITON, O2SATVEN,  in the last 168 hours ABG No results found for this basename: PHART, PCO2ART, PO2ART,  in the last 168 hours Liver Enzymes No results found for this basename: AST, ALT, ALKPHOS, BILITOT, ALBUMIN,  in the last 168 hours Cardiac Enzymes  Recent Labs Lab 05/18/14 1332  PROBNP 69.0   Glucose No results  found for this basename: GLUCAP,  in the last 168 hours  Imaging Dg Chest 2 View  05/18/2014   CLINICAL DATA:  Shortness of breath.  Cough.  EXAM: CHEST  2 VIEW  COMPARISON:  04/15/2014 and 11/14/2013  FINDINGS: Heart size and pulmonary vascularity are normal. CABG. Calcification in the arch of the aorta. Multiple pleural calcifications bilaterally, stable.  No infiltrates or effusions.  No acute osseous abnormality.  IMPRESSION: No acute abnormalities.   Electronically Signed   By: Rozetta Nunnery M.D.   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 Acute hypoxemic respiratory failure > due to PE P:   -admit to tele -heparin gtt -warfarin per pharmacy consult for PE (we discussed NOAC vs warfarin, he prefers warfarin) -echo -plan three months warfarin -tessalon perles -titrate O2 for SpO2 > 92%  CARDIOVASCULAR CVL n/a A: PE as above Hypertension Known CAD P:  -continue home b-blocker, statin  RENAL A:  No acute issues P:   -Monitor BMET and UOP -Replace electrolytes as needed   GASTROINTESTINAL A:  GERD P:  -PPI  HEMATOLOGIC A:  Anticoagulation needs as above P:  -as above  INFECTIOUS A:  No acute issues P:   BCx2 n/a UC n/a Sputum n/a Abx: n/a  ENDOCRINE A:  DM2   P:   -continue home meds -SSI AC/HS  NEUROLOGIC A:  History of stroke P:   Continue home meds On study through Dr. Clydene Fake office for anti-platelet agent (would also need to be considered if he changes his mind and wants a NOAC)   Roselie Awkward, MD [place] [facility] Pager: [phone] Cell: [phone] If no response, call [phone]   05/19/2014, 4:44 PM

## 2014-05-19 NOTE — Progress Notes (Addendum)
ANTICOAGULATION CONSULT NOTE - Initial Consult  Pharmacy Consult for Heparin Indication: pulmonary embolus  Allergies  Allergen Reactions  . Cephalexin     Unknown  . Doxycycline     Unknown  . Keflex [Cephalexin]   . Lac Bovis Other (See Comments)    lactose intolerant  . Pentazocine Lactate     He passed out- he had a seizure.  This occurred around 2000    Patient Measurements: Height: 5' 6"  (167.6 cm) Weight: 208 lb (94.348 kg) IBW/kg (Calculated) : 63.8 Heparin Dosing Weight: 84 kg  Vital Signs: Temp: 97.9 F (36.6 C) (09/24 1559) Temp src: Oral (09/24 1559) BP: 109/62 mmHg (09/24 1559) Pulse Rate: 64 (09/24 1559)  Labs:  Recent Labs  05/18/14 1332  HGB 12.1*  HCT 36.5*  PLT 209.0  CREATININE 1.0    Estimated Creatinine Clearance: 73.9 ml/min (by C-G formula based on Cr of 1).   Medical History: Past Medical History  Diagnosis Date  . Coronary artery disease     a. Low level exercise Lex MV 3/14: low risk, EF 62%, inf defect 2/2 diaph attenuation vs artifiact, small area of scar possible, no ischemia  . Hyperlipidemia   . Hypertension   . History of seizure disorder   . History of atrial flutter   . Depression   . Back pain     persistent  . Hearing loss   . Diverticulitis   . H/O alcohol abuse   . Diabetes mellitus     controled by diet  . GERD (gastroesophageal reflux disease)   . Seizures   . Headache(784.0)   . Arthritis     of spine  . Sleep apnea     does not use CPAP  . Hx of echocardiogram     Echo 7/14:  Mod LVH, EF 55-60%, Gr 1 DD, mild MR, PASP 36  . AKI (acute kidney injury)     a. 6/14: resolved after d/c ARB;  b. RA U/S 7/14: no RA stenosis  . Atrial flutter   . Stroke   . Shortness of breath   . Renal stone 04/15/2014    Assessment: 69 YOM admitted with B/L PE confirmed with CTA, pharmacy is consulted to start IV heparin bridge to coumadin. Pt. Is not on anticoagulation prior to admission. Baseline hgb 12.1, plt 209,  PT/INR, aPTT pending.   Goal of Therapy:  Heparin level 0.3-0.7 units/ml Monitor platelets by anticoagulation protocol: Yes   Plan:  Heparin bolus 5000 units x 1, then heparin infusion 1400 units/hr F/u 8 hr heparin level at 0100 Coumadin 7.44m po x 1 tonight Daily heparin level, PT/INR and CBC Coumadin book and Video Coumadin education with Pharmacist   MMaryanna Shape PharmD, BCPS  Clinical Pharmacist  Pager: [phone]   05/19/2014,4:45 PM

## 2014-05-19 NOTE — Telephone Encounter (Signed)
See lab results note  I spoke with the pt's spouse and notified of results/recs  Per MW- We also need MBS and sinus ct- already ordered  Spouse aware

## 2014-05-19 NOTE — ED Notes (Signed)
Pt back from x-ray.

## 2014-05-19 NOTE — Progress Notes (Signed)
Quick Note:  See PN dated 9/24 ______

## 2014-05-19 NOTE — ED Notes (Signed)
Pt sent here after CT scan today showing PE: pt sts more lethargic than normal and SOB; pt on home O2

## 2014-05-19 NOTE — Assessment & Plan Note (Signed)
The most common causes of chronic cough in immunocompetent adults include the following: upper airway cough syndrome (UACS), previously referred to as postnasal drip syndrome (PNDS), which is caused by variety of rhinosinus conditions; (2) asthma; (3) GERD; (4) chronic bronchitis from cigarette smoking or other inhaled environmental irritants; (5) nonasthmatic eosinophilic bronchitis; and (6) bronchiectasis.   These conditions, singly or in combination, have accounted for up to 94% of the causes of chronic cough in prospective studies.   Other conditions have constituted no >6% of the causes in prospective studies These have included bronchogenic carcinoma, chronic interstitial pneumonia, sarcoidosis, left ventricular failure, ACEI-induced cough, and aspiration from a condition associated with pharyngeal dysfunction.    Chronic cough is often simultaneously caused by more than one condition. A single cause has been found from 38 to 82% of the time, multiple causes from 18 to 62%. Multiply caused cough has been the result of three diseases up to 42% of the time.       Based on hx and exam, this is most likely:  Classic Upper airway cough syndrome, so named because it's frequently impossible to sort out how much is  CR/sinusitis with freq throat clearing (which can be related to primary GERD)   vs  causing  secondary (" extra esophageal")  GERD from wide swings in gastric pressure that occur with throat clearing, often  promoting self use of mint and menthol lozenges that reduce the lower esophageal sphincter tone and exacerbate the problem further in a cyclical fashion.   These are the same pts (now being labeled as having "irritable larynx syndrome" by some cough centers) who not infrequently have a history of having failed to tolerate ace inhibitors,  dry powder inhalers or biphosphonates or report having atypical reflux symptoms that don't respond to standard doses of PPI , and are easily confused as  having aecopd or asthma flares by even experienced allergists/ pulmonologists.   The first step is to maximize acid suppression and  Max non-pharmacologic component and proceed with sinus ct and DG es  However, the "fits" she is describing are more typical of VCD than uacs and may be functional

## 2014-05-19 NOTE — Assessment & Plan Note (Signed)
-   [facility]fts [facility] 12[facility]2012 FEV1  2.1 (71%) ratio 79 and DLCO 57 corrects to 82  - pFT's 06/09/2013  FEV 1 2.10 (78%) ratio 81 and no change p B2 and DLCO 75%   VC = 2.83=77% - 02/11/2013  Walked RA x 3 laps @ 185 ft each stopped due to end of study no desat   - placed on 27 Mar 2014 p admit ? Why needs it?  - 05/18/2014   Walked 2lpm x one lap @ 185 stopped due to legs gave out with sats still 98%   Not clear at all why and to what extent he is really sob as not able to reproduce it albeit on 02 which he does not clearly need  Does need to complete the w/u with CTa asap based on d dimer and then regroup in 2 weeks with all meds in hand

## 2014-05-19 NOTE — Telephone Encounter (Signed)
I spoke with Rose directly and had CT department send patient directly to [facility] ER. Rose then asked if we should complete doppler that was ordered as well(per CY do not do doppler and send patient to ER now). Rose is aware of this and has sent patient to [facility] ER(Rose states patient was being taken to ER by his wife who drove them to the CT scan).   Paged [phone] pager-spoke with Dr. Titus Mould who is aware of results on patient and will meet patient at ER to admit. I then spoke with Chad-charge nurse for ER at [facility] who was made aware that patient is on his way there for PE's and DF meeting patient there for admit.   Will forward closed message to MW as Juluis Rainier on his patient.

## 2014-05-19 NOTE — Assessment & Plan Note (Signed)
Ct [facility] 11/04/2012 1. Normal heart size with changes of CABG. 2. Small hiatal hernia. 3. Bilateral pleural calcifications along the pleura anteriorly and posteriorly with pleural thickening in these locations. This is possibly related to remote inflammatory/traumatic etiology with asbestosis not excluded  To date there is no evidence of ILD related to asbestosis, repeat CT ordered due to new 02 dep resp failure

## 2014-05-19 NOTE — Telephone Encounter (Signed)
Received call report from Clearnce Sorrel D-Dimer = 2.02  Will forward to MW.

## 2014-05-19 NOTE — Assessment & Plan Note (Signed)
Not clear at all that he needs 02, will regroup in 2 weeks

## 2014-05-20 DIAGNOSIS — R05 Cough: Secondary | ICD-10-CM

## 2014-05-20 DIAGNOSIS — I517 Cardiomegaly: Secondary | ICD-10-CM

## 2014-05-20 DIAGNOSIS — E119 Type 2 diabetes mellitus without complications: Secondary | ICD-10-CM

## 2014-05-20 DIAGNOSIS — J9600 Acute respiratory failure, unspecified whether with hypoxia or hypercapnia: Secondary | ICD-10-CM

## 2014-05-20 DIAGNOSIS — R059 Cough, unspecified: Secondary | ICD-10-CM

## 2014-05-20 DIAGNOSIS — I2699 Other pulmonary embolism without acute cor pulmonale: Principal | ICD-10-CM

## 2014-05-20 LAB — BASIC METABOLIC PANEL
Anion gap: 16 — ABNORMAL HIGH (ref 5–15)
BUN: 20 mg/dL (ref 6–23)
CO2: 25 mEq/L (ref 19–32)
CREATININE: 0.86 mg/dL (ref 0.50–1.35)
Calcium: 9.1 mg/dL (ref 8.4–10.5)
Chloride: 98 mEq/L (ref 96–112)
GFR calc Af Amer: >90 mL/min (ref >90)
GFR, EST NON AFRICAN AMERICAN: 86 mL/min — AB (ref >90)
GLUCOSE: 99 mg/dL (ref 70–99)
POTASSIUM: 3.5 meq/L — AB (ref 3.7–5.3)
Sodium: 139 mEq/L (ref 137–147)

## 2014-05-20 LAB — GLUCOSE, CAPILLARY
GLUCOSE-CAPILLARY: 133 mg/dL — AB (ref 70–99)
GLUCOSE-CAPILLARY: 112 mg/dL — AB (ref 70–99)
Glucose-Capillary: 121 mg/dL — ABNORMAL HIGH (ref 70–99)
Glucose-Capillary: 120 mg/dL — ABNORMAL HIGH (ref 70–99)

## 2014-05-20 LAB — CBC
HCT: 34.1 % — ABNORMAL LOW (ref 39.0–52.0)
Hemoglobin: 11.5 g/dL — ABNORMAL LOW (ref 13.0–17.0)
MCH: 31.5 pg (ref 26.0–34.0)
MCHC: 33.7 g/dL (ref 30.0–36.0)
MCV: 93.4 fL (ref 78.0–100.0)
Platelets: 192 10*3/uL (ref 150–400)
RBC: 3.65 MIL/uL — AB (ref 4.22–5.81)
RDW: 13.2 % (ref 11.5–15.5)
WBC: 7.5 10*3/uL (ref 4.0–10.5)

## 2014-05-20 LAB — HEPARIN LEVEL (UNFRACTIONATED)
HEPARIN UNFRACTIONATED: 0.78 [IU]/mL — AB (ref 0.30–0.70)
Heparin Unfractionated: 0.9 IU/mL — ABNORMAL HIGH (ref 0.30–0.70)
Heparin Unfractionated: 0.65 IU/mL (ref 0.30–0.70)

## 2014-05-20 LAB — PROTIME-INR
INR: 1.11 (ref 0.00–1.49)
Prothrombin Time: 14.3 seconds (ref 11.6–15.2)

## 2014-05-20 MED ORDER — ACETAMINOPHEN-CODEINE #4 300-60 MG PO TABS: 1.0000 | ORAL_TABLET | Freq: Three times a day (TID) | ORAL | Status: DC | PRN

## 2014-05-20 MED ORDER — POTASSIUM CHLORIDE CRYS ER 20 MEQ PO TBCR
20.0000 meq | EXTENDED_RELEASE_TABLET | ORAL | Status: AC
  Administered 2014-05-20 (×2): 20 meq via ORAL
  Filled 2014-05-20 (×2): qty 1

## 2014-05-20 MED ORDER — ACETAMINOPHEN-CODEINE #3 300-30 MG PO TABS
1.0000 | ORAL_TABLET | Freq: Three times a day (TID) | ORAL | Status: DC | PRN
  Administered 2014-05-20 – 2014-05-25 (×8): 1 via ORAL
  Filled 2014-05-20 (×8): qty 1

## 2014-05-20 MED ORDER — WARFARIN SODIUM 7.5 MG PO TABS
7.5000 mg | ORAL_TABLET | Freq: Once | ORAL | Status: AC
  Administered 2014-05-20: 7.5 mg via ORAL
  Filled 2014-05-20 (×2): qty 1

## 2014-05-20 MED ORDER — CODEINE SULFATE 30 MG PO TABS: 30.0000 mg | ORAL_TABLET | Freq: Three times a day (TID) | ORAL | Status: DC | PRN

## 2014-05-20 NOTE — Progress Notes (Signed)
ANTICOAGULATION CONSULT NOTE - Follow up [place] for Heparin Indication: pulmonary embolus  Allergies  Allergen Reactions  . Cephalexin     Unknown  . Doxycycline     Unknown  . Keflex [Cephalexin]   . Lac Bovis Other (See Comments)    lactose intolerant  . Pentazocine Lactate     He passed out- he had a seizure.  This occurred around 2000  . Talwin [Pentazocine]     Patient Measurements: Height: 5' 6"  (167.6 cm) Weight: 202 lb 13.2 oz (92 kg) IBW/kg (Calculated) : 63.8 Heparin Dosing Weight: 84 kg  Vital Signs: Temp: 97.9 F (36.6 C) (09/24 2300) Temp src: Oral (09/24 2300) BP: 90/67 mmHg (09/24 2300) Pulse Rate: 63 (09/24 2300)  Labs:  Recent Labs  05/18/14 1332 05/19/14 1840 05/20/14 0105  HGB 12.1* 11.7* 11.5*  HCT 36.5* 34.2* 34.1*  PLT 209.0 206 192  APTT  --  163*  --   LABPROT  --  14.8 14.3  INR  --  1.16 1.11  HEPARINUNFRC  --   --  0.90*  CREATININE 1.0 0.82 0.86    Estimated Creatinine Clearance: 84.9 ml/min (by C-G formula based on Cr of 0.86).   Medical History: Past Medical History  Diagnosis Date  . Coronary artery disease     a. Low level exercise Lex MV 3/14: low risk, EF 62%, inf defect 2/2 diaph attenuation vs artifiact, small area of scar possible, no ischemia  . Hyperlipidemia   . Hypertension   . History of seizure disorder   . History of atrial flutter   . Depression   . Back pain     persistent  . Hearing loss   . Diverticulitis   . H/O alcohol abuse   . Diabetes mellitus     controled by diet  . GERD (gastroesophageal reflux disease)   . Seizures   . Headache(784.0)   . Arthritis     of spine  . Sleep apnea     does not use CPAP  . Hx of echocardiogram     Echo 7/14:  Mod LVH, EF 55-60%, Gr 1 DD, mild MR, PASP 36  . AKI (acute kidney injury)     a. 6/14: resolved after d/c ARB;  b. RA U/S 7/14: no RA stenosis  . Atrial flutter   . Stroke   . Shortness of breath   . Renal stone 04/15/2014     Assessment: 38 YOM admitted with B/L PE confirmed with CTA. Currently on IV heparin bridge at 1400 units/hr. Pt. Baseline hgb 12.1, plt 209, PT/INR, aPTT pending. Initial heparin level is supra-therapeutic at 0.9. RN reports no s/s of bleeding.   Goal of Therapy:  Heparin level 0.3-0.7 units/ml Monitor platelets by anticoagulation protocol: Yes   Plan:  Decrease heparin infusion to 1250 units/hr F/u 8 hour HL at 1030 Daily heparin level, PT/INR and CBC   Albertina Parr, PharmD.  Clinical Pharmacist Pager [phone]

## 2014-05-20 NOTE — Care Management Note (Signed)
    Page 1 of 1   05/20/2014     8:49:29 AM CARE MANAGEMENT NOTE 05/20/2014  Patient:  [name], [name]   Account Number:  [id number]  Date Initiated:  05/20/2014  Documentation initiated by:  Elissa Hefty  Subjective/Objective Assessment:   adm w pul embolus     Action/Plan:   lives w wife, pcp dr s [name], home o2 w ahc   Anticipated DC Date:     Anticipated DC Plan:           Choice offered to / List presented to:             Status of service:   Medicare Important Message given?   (If response is "NO", the following Medicare IM given date fields will be blank) Date Medicare IM given:   Medicare IM given by:   Date Additional Medicare IM given:   Additional Medicare IM given by:    Discharge Disposition:    Per UR Regulation:  Reviewed for med. necessity/level of care/duration of stay  If discussed at [place] of Stay Meetings, dates discussed:    Comments:

## 2014-05-20 NOTE — H&P (Signed)
PULMONARY / CRITICAL CARE MEDICINE   Name: [name] MRN: [id number] DOB: Oct 06, 1945    ADMISSION DATE:  05/19/2014 CONSULTATION DATE:  05/19/2014  REFERRING MD :  [name]  CHIEF COMPLAINT:  Dyspnea  INITIAL PRESENTATION: 68 y/o male with known CAD admitted on 9/24 with bilateral pulmonary embolism.  STUDIES:  9/24 CT chest >> PE in RUL, RML and bilateral lower lobes; RV:LV 1:1.13  SIGNIFICANT EVENTS:   HISTORY OF PRESENT ILLNESS:  This is a pleasant 68 y/o male who saw Dr. [name] today for shortness of breath.  He notes ongoing dyspnea and cough with [place] mucus production for several years, but in the last month he has had increased dyspnea even at rest. Notably, he was hospitalized for 3 days one month ago for a kidney stone.  He was discharged home on oxygen with a presumptive diagnosis of asbestosis.  He went to Dr. [name] today for the dyspnea.  A CT angiogram was ordered and he was found to have a pulmonary embolism.   SUBJECTIVE:  Doing well this morning, wife at bedside.  States that he breathing is much better this morning.   VITAL SIGNS: Temp:  [97.9 F (36.6 C)-98.6 F (37 C)] 98.4 F (36.9 C) (09/25 0800) Pulse Rate:  [54-71] 61 (09/25 0800) Resp:  [15-23] 16 (09/25 0800) BP: (90-132)/(48-85) 122/53 mmHg (09/25 0800) SpO2:  [93 %-100 %] 97 % (09/25 0800) Weight:  [202 lb 13.2 oz (92 kg)-208 lb (94.348 kg)] 204 lb 2.3 oz (92.6 kg) (09/25 0300) HEMODYNAMICS:   VENTILATOR SETTINGS:   INTAKE / OUTPUT:  Intake/Output Summary (Last 24 hours) at 05/20/14 1154 Last data filed at 05/20/14 1000  Gross per 24 hour  Intake 456.25 ml  Output   1200 ml  Net -743.75 ml    PHYSICAL EXAMINATION: General:  Chronically ill appearing, resting comfortably Neuro:  Alert and oriented x4, maew HEENT:  NCAT, PERRL Cardiovascular:  RRR, no mgr Lungs:  CTA B Abdomen:  BS+, soft, nontender Musculoskeletal:  Normal bulk and tone.  Mild warmth in right calf, no erythema or  swelling.  Skin:  Dry, intact  LABS:  CBC  Recent Labs Lab 05/18/14 1332 05/19/14 1840 05/20/14 0105  WBC 7.4 7.8 7.5  HGB 12.1* 11.7* 11.5*  HCT 36.5* 34.2* 34.1*  PLT 209.0 206 192   Coag's  Recent Labs Lab 05/19/14 1840 05/20/14 0105  APTT 163*  --   INR 1.16 1.11   BMET  Recent Labs Lab 05/18/14 1332 05/19/14 1840 05/20/14 0105  NA 138 137 139  K 4.2 3.5* 3.5*  CL 103 96 98  CO2 30 25 25   BUN 19 20 20   CREATININE 1.0 0.82 0.86  GLUCOSE 114* 87 99   Electrolytes  Recent Labs Lab 05/18/14 1332 05/19/14 1840 05/20/14 0105  CALCIUM 9.4 9.5 9.1   Sepsis Markers No results found for this basename: LATICACIDVEN, PROCALCITON, O2SATVEN,  in the last 168 hours ABG No results found for this basename: PHART, PCO2ART, PO2ART,  in the last 168 hours Liver Enzymes No results found for this basename: AST, ALT, ALKPHOS, BILITOT, ALBUMIN,  in the last 168 hours Cardiac Enzymes  Recent Labs Lab 05/18/14 1332 05/19/14 1840  PROBNP 69.0 192.3*   Glucose  Recent Labs Lab 05/19/14 2327 05/20/14 0819  GLUCAP 102* 133*    Imaging Dg Chest 2 View  05/19/2014   CLINICAL DATA:  Pulmonary embolus, shortness of breath, cough congestion hypertension diabetes nonsmoker  EXAM: CHEST  2 VIEW  COMPARISON:  CT scan 05/19/2014 and chest radiograph 05/18/2012  FINDINGS: Status post previous CABG. Loop recording device noted. Heart size normal. Vascular pattern normal. No consolidation or effusion.  IMPRESSION: No acute radiographic findings in this patient with known acute pulmonary embolism.   Electronically Signed   By: Skipper Cliche M.D.   On: 05/19/2014 18:31   Ct Angio Chest W/cm &/or Wo Cm  05/19/2014   CLINICAL DATA:  Dyspnea, shortness of breath. Elevated D-dimer test. Evaluate for pulmonary embolus.  EXAM: CT ANGIOGRAPHY CHEST WITH CONTRAST  TECHNIQUE: Multidetector CT imaging of the chest was performed using the standard protocol during bolus administration of  intravenous contrast. Multiplanar CT image reconstructions and MIPs were obtained to evaluate the vascular anatomy.  CONTRAST:  56m OMNIPAQUE IOHEXOL 350 MG/ML SOLN  COMPARISON:  Chest x-ray May 18, 2014, chest CT August 27, 2013  FINDINGS: There is pulmonary embolus involving the segmental and subsegmental pulmonary arteries of the right middle lobe, right lower lobe, subsegmental pulmonary artery of the right upper lobe and left lower lobe. There is no evidence of pulmonary infarct. The right ventricular to left ventricular ratio is 1.13. There is no pleural effusion or focal pneumonia. Calcified pleural plaques are identified bilaterally. There is no mediastinal or hilar lymphadenopathy. There is atherosclerosis of the aorta without aneurysmal dilatation. The heart size is normal. There is no pericardial effusion. Images of the visualized upper abdomen demonstrates no acute abnormality. There is a small hiatal hernia.  Review of the MIP images confirms the above findings.  IMPRESSION: Pulmonary embolus involving right upper lobe, right middle lobe and bilateral lower lobes. The ventricular ratio suggest right heart strain. There is no evidence of pulmonary infarct.   Electronically Signed   By: WAbelardo DieselM.D.   On: 05/19/2014 15:26   Dg Esophagus  05/19/2014   CLINICAL DATA:  Coughing and choking spells  EXAM: ESOPHOGRAM / BARIUM SWALLOW / BARIUM TABLET STUDY  TECHNIQUE: Combined double contrast and single contrast examination performed using effervescent crystals, thick barium liquid, and thin barium liquid. The patient was observed with fluoroscopy swallowing a 170mbarium sulphate tablet.  FLUOROSCOPY TIME:  1 min 13 seconds  COMPARISON:  None.  FINDINGS: There was normal pharyngeal anatomy and motility. Contrast flowed freely through the esophagus without evidence of stricture or mass. There was normal esophageal mucosa without evidence of irregularity or ulceration. There are intermittent  diffuse esophageal spasms. During the esophageal spasms there is retention of contrast within the esophagus without significant emptying. No evidence of reflux. No definite hiatal hernia was demonstrated.  At the end of the examination a 13 mm barium tablet was administered which transited through the esophagus and esophagogastric junction without delay.  IMPRESSION: 1. Intermittent diffuse esophageal spasms. During the esophageal spasms, there is retention of contrast within the esophagus without significant emptying.   Electronically Signed   By: HeKathreen Devoid On: 05/19/2014 12:24   CtBethelo Cm  05/19/2014   CLINICAL DATA:  Persistent cough and sinus drainage.  EXAM: CT PARANASAL SINUS WITHOUT CONTRAST  TECHNIQUE: Multidetector CT images of the paranasal sinuses were obtained using the standard protocol without intravenous contrast.  COMPARISON:  Head CT, 04/01/2014  FINDINGS: Sinuses are clear.  No areas of bone resorption or sclerosis.  Clear mastoid air cells and middle ear cavities.  Status post left cataract surgery. Globes and orbits otherwise unremarkable.  No soft tissue masses or areas of significant soft tissue asymmetry. No enlarged  lymph nodes.  Dense calcifications are noted along the intracranial internal carotid arteries.  IMPRESSION: 1. Clear sinuses.   Electronically Signed   By: Lajean Manes M.D.   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 Acute hypoxemic respiratory failure > due to PE P:    -heparin gtt -warfarin per pharmacy consult for PE (we discussed NOAC vs warfarin, he prefers warfarin) - f\u echo -plan three months warfarin -tessalon perles -titrate O2 for SpO2 > 92%  CARDIOVASCULAR CVL n/a A: PE as above Hypertension Known CAD P:  -continue home b-blocker,  statin  RENAL A:  No acute issues P:   -Monitor BMET and UOP -Replace electrolytes as needed   GASTROINTESTINAL A:  GERD P:  -PPI  HEMATOLOGIC A:  Anticoagulation needs as above P:  -as above  INFECTIOUS A:  No acute issues P:   BCx2 n/a UC n/a Sputum n/a Abx: n/a  ENDOCRINE A:  DM2   P:   -continue home meds -SSI AC/HS  NEUROLOGIC A:  History of stroke P:   Continue home meds On study through Dr. Clydene Fake office for anti-platelet agent (would also need to be considered if he changes his mind and wants a NOAC)   Critical Care service time = 37mns   VVilinda Boehringer MD [facility] Pager [phone] Call Pager [phone]

## 2014-05-20 NOTE — Progress Notes (Signed)
  Echocardiogram 2D Echocardiogram has been performed.  [name] 05/20/2014, 10:18 AM

## 2014-05-20 NOTE — Progress Notes (Signed)
[facility] ADULT ICU REPLACEMENT PROTOCOL FOR AM LAB REPLACEMENT ONLY  The patient does apply for the [facility] Adult ICU Electrolyte Replacment Protocol based on the criteria listed below:   1. Is GFR >/= 40 ml/min? Yes.    Patient's GFR today is >90 2. Is urine output >/= 0.5 ml/kg/hr for the last 6 hours? Yes.   Patient's UOP is .86 ml/kg/hr 3. Is BUN < 60 mg/dL? Yes.    Patient's BUN today is 20 4. Abnormal electrolyte K 3.5 5. Ordered repletion with: per protocol 6. If a panic level lab has been reported, has the CCM MD in charge been notified? Yes.  .   Physician:  [place], Carter 05/20/2014 3:30 AM

## 2014-05-20 NOTE — Progress Notes (Addendum)
ANTICOAGULATION CONSULT NOTE - Follow Up Consult  Pharmacy Consult for Heparin and Coumadin Indication: pulmonary embolus  Allergies  Allergen Reactions  . Cephalexin     Unknown  . Doxycycline     Unknown  . Keflex [Cephalexin]   . Lac Bovis Other (See Comments)    lactose intolerant  . Pentazocine Lactate     He passed out- he had a seizure.  This occurred around 2000  . Talwin [Pentazocine]     Patient Measurements: Height: 5' 6"  (167.6 cm) Weight: 204 lb 2.3 oz (92.6 kg) IBW/kg (Calculated) : 63.8 Heparin Dosing Weight: 84kg  Vital Signs: Temp: 98.4 F (36.9 C) (09/25 0800) Temp src: Oral (09/25 0800) BP: 122/53 mmHg (09/25 0800) Pulse Rate: 61 (09/25 0800)  Labs:  Recent Labs  05/18/14 1332 05/19/14 1840 05/20/14 0105 05/20/14 1105  HGB 12.1* 11.7* 11.5*  --   HCT 36.5* 34.2* 34.1*  --   PLT 209.0 206 192  --   APTT  --  163*  --   --   LABPROT  --  14.8 14.3  --   INR  --  1.16 1.11  --   HEPARINUNFRC  --   --  0.90* 0.65  CREATININE 1.0 0.82 0.86  --     Estimated Creatinine Clearance: 85.1 ml/min (by C-G formula based on Cr of 0.86).   Medications:  Heparin @ 1250 units/hr  Assessment: 68yom started on heparin and coumadin yesterday for new PE (confirmed by CT angio 9/24). Today is day #2 overlap therapy. Heparin level is therapeutic after rate decrease this morning. INR remains below goal after first dose of coumadin as expected. CBC stable. No bleeding reported.  He will need at least 5 days of overlap therapy with heparin and coumadin.  Goal of Therapy:  INR 2-3 Heparin level 0.3-0.7 units/ml Monitor platelets by anticoagulation protocol: Yes   Plan:  1) Continue heparin at 1250 units/hr 2) Check 8 hour heparin level to confirm 3) Repeat coumadin 7.25m x 1 4) INR in AM  MDeboraha Sprang9/25/2015,12:19 PM   Addendum: Heparin level is supratherapeutic at 0.78 on 1250 units/hr. No bleeding noted. No problems with infusion per  RN.  Decrease heparin drip to 1150 units/hr Heparin level in am  J[facility] PMonte RioD., BCPS Clinical Pharmacist Pager: [phone]/25/2015 9:16 PM

## 2014-05-21 LAB — CBC
HCT: 36.2 % — ABNORMAL LOW (ref 39.0–52.0)
Hemoglobin: 12 g/dL — ABNORMAL LOW (ref 13.0–17.0)
MCH: 32.3 pg (ref 26.0–34.0)
MCHC: 33.1 g/dL (ref 30.0–36.0)
MCV: 97.3 fL (ref 78.0–100.0)
Platelets: 186 10*3/uL (ref 150–400)
RBC: 3.72 MIL/uL — ABNORMAL LOW (ref 4.22–5.81)
RDW: 13.1 % (ref 11.5–15.5)
WBC: 6.2 10*3/uL (ref 4.0–10.5)

## 2014-05-21 LAB — GLUCOSE, CAPILLARY
GLUCOSE-CAPILLARY: 92 mg/dL (ref 70–99)
Glucose-Capillary: 125 mg/dL — ABNORMAL HIGH (ref 70–99)
Glucose-Capillary: 110 mg/dL — ABNORMAL HIGH (ref 70–99)
Glucose-Capillary: 108 mg/dL — ABNORMAL HIGH (ref 70–99)

## 2014-05-21 LAB — PROTIME-INR
INR: 1.12 (ref 0.00–1.49)
Prothrombin Time: 14.4 seconds (ref 11.6–15.2)

## 2014-05-21 LAB — HEPARIN LEVEL (UNFRACTIONATED)
Heparin Unfractionated: 0.92 IU/mL — ABNORMAL HIGH (ref 0.30–0.70)
Heparin Unfractionated: 0.67 IU/mL (ref 0.30–0.70)
Heparin Unfractionated: 0.55 IU/mL (ref 0.30–0.70)

## 2014-05-21 MED ORDER — WARFARIN SODIUM 10 MG PO TABS
10.0000 mg | ORAL_TABLET | Freq: Once | ORAL | Status: AC
  Administered 2014-05-21: 10 mg via ORAL
  Filled 2014-05-21: qty 1

## 2014-05-21 NOTE — Progress Notes (Signed)
ANTICOAGULATION CONSULT NOTE - Follow Up Consult  Pharmacy Consult for Heparin  Indication: pulmonary embolus  Allergies  Allergen Reactions  . Cephalexin     Unknown  . Doxycycline     Unknown  . Keflex [Cephalexin]   . Lac Bovis Other (See Comments)    lactose intolerant  . Pentazocine Lactate     He passed out- he had a seizure.  This occurred around 2000  . Talwin [Pentazocine]     Patient Measurements: Height: 5' 6"  (167.6 cm) Weight: 204 lb 2.3 oz (92.6 kg) IBW/kg (Calculated) : 63.8 Heparin Dosing Weight: 84kg  Vital Signs: Temp: 98.7 F (37.1 C) (09/26 0400) Temp src: Oral (09/26 0400) BP: 107/60 mmHg (09/26 0400) Pulse Rate: 64 (09/26 0400)  Labs:  Recent Labs  05/18/14 1332 05/19/14 1840  05/20/14 0105 05/20/14 1105 05/20/14 2024 05/21/14 0453  HGB 12.1* 11.7*  --  11.5*  --   --  12.0*  HCT 36.5* 34.2*  --  34.1*  --   --  36.2*  PLT 209.0 206  --  192  --   --  186  APTT  --  163*  --   --   --   --   --   LABPROT  --  14.8  --  14.3  --   --  14.4  INR  --  1.16  --  1.11  --   --  1.12  HEPARINUNFRC  --   --   < > 0.90* 0.65 0.78* 0.92*  CREATININE 1.0 0.82  --  0.86  --   --   --   < > = values in this interval not displayed.  Estimated Creatinine Clearance: 85.1 ml/min (by C-G formula based on Cr of 0.86).   Medications:  Heparin @ 1150 units/hr  Assessment: 68yom started on heparin and coumadin yesterday for new PE (confirmed by CT angio 9/24). Today is day #3 overlap therapy. Heparin level remains supra-therapeutic this AM despite rate decrease. RN reports no s/s of bleeding   He will need at least 5 days of overlap therapy with heparin and coumadin.  Goal of Therapy:  Heparin level 0.3-0.7 units/ml Monitor platelets by anticoagulation protocol: Yes   Plan:  1) Decrease heparin to 1000 units/hr 2) Check 8 hour heparin level to confirm 3) Monitor for s/s of bleeding    Albertina Parr, PharmD.  Clinical Pharmacist Pager  [phone]

## 2014-05-21 NOTE — Progress Notes (Signed)
ANTICOAGULATION CONSULT NOTE - Follow Up Consult  Pharmacy Consult for Heparin  Indication: pulmonary embolus  Allergies  Allergen Reactions  . Cephalexin     Unknown  . Doxycycline     Unknown  . Keflex [Cephalexin]   . Lac Bovis Other (See Comments)    lactose intolerant  . Pentazocine Lactate     He passed out- he had a seizure.  This occurred around 2000  . Talwin [Pentazocine]     Patient Measurements: Height: 5' 6"  (167.6 cm) Weight: 201 lb 14.4 oz (91.581 kg) IBW/kg (Calculated) : 63.8 Heparin Dosing Weight: 84kg  Vital Signs: Temp: 97.6 F (36.4 C) (09/26 1447) Temp src: Other (Comment) (09/26 1447) BP: 115/67 mmHg (09/26 1447) Pulse Rate: 67 (09/26 1447)  Labs:  Recent Labs  05/19/14 1840 05/20/14 0105  05/20/14 2024 05/21/14 0453 05/21/14 1455  HGB 11.7* 11.5*  --   --  12.0*  --   HCT 34.2* 34.1*  --   --  36.2*  --   PLT 206 192  --   --  186  --   APTT 163*  --   --   --   --   --   LABPROT 14.8 14.3  --   --  14.4  --   INR 1.16 1.11  --   --  1.12  --   HEPARINUNFRC  --  0.90*  < > 0.78* 0.92* 0.67  CREATININE 0.82 0.86  --   --   --   --   < > = values in this interval not displayed.  Estimated Creatinine Clearance: 84.7 ml/min (by C-G formula based on Cr of 0.86).   Medications:  . heparin 1,000 Units/hr (05/21/14 0636)     Assessment: 68yom started on heparin and coumadin yesterday for new PE (confirmed by CT angio 9/24). Today is day #3 overlap therapy. Heparin level is now therapeutic after rate adjustment.  His INR is essentially unchanged after 2 doses of Coumadin. CBC is stable, no bleeding noted.  He will need at least 5 days of overlap therapy with heparin and coumadin.  Goal of Therapy:  INR 2-3 Heparin level 0.3-0.7 units/ml Monitor platelets by anticoagulation protocol: Yes   Plan:  Continue Heparin at 1000 units/hr Check Heparin level in 6 hours to confirm Increase Coumadin to 94m today Daily PT/INR, Heparin level,  CBC  MLegrand Como Pharm.D., BCPS, AAHIVP Clinical Pharmacist Phone: [phone] [phone]/26/2015, 3:29 PM

## 2014-05-21 NOTE — Progress Notes (Signed)
ANTICOAGULATION CONSULT NOTE - Follow Up Consult  Pharmacy Consult for Heparin Indication: pulmonary embolus  Allergies  Allergen Reactions  . Cephalexin     Unknown  . Doxycycline     Unknown  . Keflex [Cephalexin]   . Lac Bovis Other (See Comments)    lactose intolerant  . Pentazocine Lactate     He passed out- he had a seizure.  This occurred around 2000  . Talwin [Pentazocine]     Patient Measurements: Height: 5' 6"  (167.6 cm) Weight: 201 lb 15.1 oz (91.6 kg) IBW/kg (Calculated) : 63.8 Heparin Dosing Weight: 84 kg  Vital Signs: Temp: 97.6 F (36.4 C) (09/26 1447) Temp src: Other (Comment) (09/26 1447) BP: 120/61 mmHg (09/26 1700) Pulse Rate: 66 (09/26 1700)  Labs:  Recent Labs  05/19/14 1840 05/20/14 0105  05/21/14 0453 05/21/14 1455 05/21/14 1934  HGB 11.7* 11.5*  --  12.0*  --   --   HCT 34.2* 34.1*  --  36.2*  --   --   PLT 206 192  --  186  --   --   APTT 163*  --   --   --   --   --   LABPROT 14.8 14.3  --  14.4  --   --   INR 1.16 1.11  --  1.12  --   --   HEPARINUNFRC  --  0.90*  < > 0.92* 0.67 0.55  CREATININE 0.82 0.86  --   --   --   --   < > = values in this interval not displayed.  Estimated Creatinine Clearance: 84.7 ml/min (by C-G formula based on Cr of 0.86).   Medications:  Infusions:  . heparin 1,000 Units/hr (05/21/14 0636)    Assessment: 19 YOM started on IV heparin for new PE (confirmed on CT angio 9/24). Repeat heparin level is therapeutic on 1000 units/hr.   Goal of Therapy:  Heparin level 0.3-0.7 units/ml Monitor platelets by anticoagulation protocol: Yes   Plan:  Continue heparin at 1000 units/hr Follow-up heparin level in AM.  Sloan Leiter, PharmD, BCPS Clinical Pharmacist [phone] 05/21/2014,8:19 PM

## 2014-05-22 DIAGNOSIS — I2510 Atherosclerotic heart disease of native coronary artery without angina pectoris: Secondary | ICD-10-CM

## 2014-05-22 DIAGNOSIS — H9190 Unspecified hearing loss, unspecified ear: Secondary | ICD-10-CM

## 2014-05-22 DIAGNOSIS — I6340 Cerebral infarction due to embolism of unspecified cerebral artery: Secondary | ICD-10-CM

## 2014-05-22 DIAGNOSIS — I2583 Coronary atherosclerosis due to lipid rich plaque: Secondary | ICD-10-CM

## 2014-05-22 LAB — CBC
HEMATOCRIT: 35 % — AB (ref 39.0–52.0)
Hemoglobin: 11.9 g/dL — ABNORMAL LOW (ref 13.0–17.0)
MCH: 32.1 pg (ref 26.0–34.0)
MCHC: 34 g/dL (ref 30.0–36.0)
MCV: 94.3 fL (ref 78.0–100.0)
Platelets: 208 10*3/uL (ref 150–400)
RBC: 3.71 MIL/uL — AB (ref 4.22–5.81)
RDW: 13 % (ref 11.5–15.5)
WBC: 6.9 10*3/uL (ref 4.0–10.5)

## 2014-05-22 LAB — PROTIME-INR
INR: 1.13 (ref 0.00–1.49)
PROTHROMBIN TIME: 14.5 s (ref 11.6–15.2)

## 2014-05-22 LAB — GLUCOSE, CAPILLARY
GLUCOSE-CAPILLARY: 121 mg/dL — AB (ref 70–99)
GLUCOSE-CAPILLARY: 111 mg/dL — AB (ref 70–99)
Glucose-Capillary: 124 mg/dL — ABNORMAL HIGH (ref 70–99)
Glucose-Capillary: 120 mg/dL — ABNORMAL HIGH (ref 70–99)

## 2014-05-22 LAB — HEPARIN LEVEL (UNFRACTIONATED): Heparin Unfractionated: 0.62 IU/mL (ref 0.30–0.70)

## 2014-05-22 MED ORDER — WARFARIN SODIUM 10 MG PO TABS
10.0000 mg | ORAL_TABLET | Freq: Once | ORAL | Status: AC
  Administered 2014-05-22: 10 mg via ORAL
  Filled 2014-05-22: qty 1

## 2014-05-22 MED ORDER — ZOLPIDEM TARTRATE 5 MG PO TABS
5.0000 mg | ORAL_TABLET | Freq: Every evening | ORAL | Status: DC | PRN
  Administered 2014-05-22 – 2014-05-24 (×3): 5 mg via ORAL
  Filled 2014-05-22 (×3): qty 1

## 2014-05-22 NOTE — Progress Notes (Signed)
ANTICOAGULATION CONSULT NOTE - Follow Up Consult  Pharmacy Consult for Heparin & Warfarin Indication: pulmonary embolus  Allergies  Allergen Reactions  . Cephalexin     Unknown  . Doxycycline     Unknown  . Keflex [Cephalexin]   . Lac Bovis Other (See Comments)    lactose intolerant  . Pentazocine Lactate     He passed out- he had a seizure.  This occurred around 2000  . Talwin [Pentazocine]     Patient Measurements: Height: 5' 6"  (167.6 cm) Weight: 201 lb 11.2 oz (91.491 kg) IBW/kg (Calculated) : 63.8 Heparin Dosing Weight: 84kg  Vital Signs: Temp: 98 F (36.7 C) (09/27 0425) Temp src: Oral (09/27 0425) BP: 101/59 mmHg (09/27 0425) Pulse Rate: 61 (09/27 0425)  Labs:  Recent Labs  05/19/14 1840 05/20/14 0105  05/21/14 0453 05/21/14 1455 05/21/14 1934 05/22/14 0354  HGB 11.7* 11.5*  --  12.0*  --   --  11.9*  HCT 34.2* 34.1*  --  36.2*  --   --  35.0*  PLT 206 192  --  186  --   --  208  APTT 163*  --   --   --   --   --   --   LABPROT 14.8 14.3  --  14.4  --   --  14.5  INR 1.16 1.11  --  1.12  --   --  1.13  HEPARINUNFRC  --  0.90*  < > 0.92* 0.67 0.55 0.62  CREATININE 0.82 0.86  --   --   --   --   --   < > = values in this interval not displayed.  Estimated Creatinine Clearance: 84.7 ml/min (by C-G formula based on Cr of 0.86).   Medications:  . heparin 1,000 Units/hr (05/22/14 0224)     Assessment: 68yom started on heparin and coumadin yesterday for new PE (confirmed by CT angio 9/24). Today is day #4 overlap therapy. Heparin level remains therapeutic.  His INR is essentially unchanged after 3 doses of Coumadin, and his Coumadin dose was increased 9/26. CBC is stable, no bleeding noted.  He will need at least 24 hours of overlap therapy with heparin and coumadin once INR is >2.  Goal of Therapy:  INR 2-3 Heparin level 0.3-0.7 units/ml Monitor platelets by anticoagulation protocol: Yes   Plan:  Continue Heparin at 1000 units/hr Give Coumadin  81m today - will consider increasing dose on 9/28 if INR does not respond Daily PT/INR, Heparin level, CBC  MLegrand Como Pharm.D., BCPS, AAHIVP Clinical Pharmacist Phone: [phone] [phone]/27/2015, 1:23 PM

## 2014-05-22 NOTE — Progress Notes (Signed)
   Name: [name] MRN: [id number] DOB: 1946-02-28    ADMISSION DATE:  05/19/2014 CONSULTATION DATE:  05/19/14  REFERRING MD :  [name]  CHIEF COMPLAINT:  dyspnea  BRIEF PATIENT DESCRIPTION: 68 y/o male with known CAD admitted on 9/24 with bilateral pulmonary embolism.   SIGNIFICANT EVENTS   STUDIES:  9/24 CT chest >> PE in RUL, RML and bilateral lower lobes; RV:LV 1:1.13   SUBJECTIVE:  Denies acute events- no chest pain, blood or increased dyspnea. Tried himself off O2 with sats ok but more comfortable on O2. Restless night- agreed to allow ambien.  VITAL SIGNS: Temp:  [97.6 F (36.4 C)-98 F (36.7 C)] 98 F (36.7 C) (09/27 0425) Pulse Rate:  [59-67] 61 (09/27 0425) Resp:  [18-20] 18 (09/27 0425) BP: (101-120)/(59-67) 101/59 mmHg (09/27 0425) SpO2:  [93 %-97 %] 94 % (09/27 0425) Weight:  [91.491 kg (201 lb 11.2 oz)-91.6 kg (201 lb 15.1 oz)] 91.491 kg (201 lb 11.2 oz) (09/27 0425)  PHYSICAL EXAMINATION: General:  Alert, oriented, wants lights out Neuro:  Speech clear, moving all extremities HEENT:  ?mild hard of hearing, gross vision intact Cardiovascular:  RR, no m/g/r, no jvd, no edema Lungs:  Quiet with few crackles, no rub, unlabored on NC2L Abdomen:  Soft, BS present Musculoskeletal:  Normal muscle bulk Skin:  No evident rash or bruising   Recent Labs Lab 05/18/14 1332 05/19/14 1840 05/20/14 0105  NA 138 137 139  K 4.2 3.5* 3.5*  CL 103 96 98  CO2 30 25 25   BUN 19 20 20   CREATININE 1.0 0.82 0.86  GLUCOSE 114* 87 99    Recent Labs Lab 05/20/14 0105 05/21/14 0453 05/22/14 0354  HGB 11.5* 12.0* 11.9*  HCT 34.1* 36.2* 35.0*  WBC 7.5 6.2 6.9  PLT 192 186 208   No results found.  ASSESSMENT / PLAN:  PULMONARY  OETT n/a  A: Provoked pulmonary embolism, non-massive as hemodynamically stable  Upper airway cough syndrome  Hx of questioned Asbestos related pleural disease> no evidence of asbestosis  Acute hypoxemic respiratory failure > due to PE    P:  -heparin gtt  -warfarin per pharmacy consult for PE (we discussed NOAC vs warfarin, he prefers warfarin)  - f\u echo  -plan three months warfarin  -tessalon perles  -titrate O2 for SpO2 > 92%   CARDIOVASCULAR  CVL n/a  A: PE as above  Hypertension  Known CAD  P:  -continue home b-blocker, statin   RENAL  A: No acute issues  P:  -Monitor BMET and UOP  -Replace electrolytes as needed K marginal 3.5 for f/u 9/28  GASTROINTESTINAL  A: GERD -He denies hx ulcer P:  -PPI prophylaxis.   HEMATOLOGIC  A: Anticoagulation needs as above  P:  -as above   INFECTIOUS  A: No acute issues  P:  BCx2 n/a  UC n/a  Sputum n/a  Abx: n/a   ENDOCRINE  A: DM2  P:  -continue home meds  -SSI AC/HS  NEUROLOGIC  A: History of stroke       Insomnia P:  Continue home meds  On study through Dr. Clydene Fake office for anti-platelet agent (would also need to be considered if he changes his mind and wants a NOAC) Adding prn Myriam Jacobson, MD [facility] [place] Pager: [phone]  05/22/2014, 8:54 AM

## 2014-05-22 NOTE — Progress Notes (Signed)
Patient requested to try without oxygen. Patient removed nasal cannula for about 15 minutes with no changes. No dyspnea. O2 sats remained 92-94%. Patient said he felt no difference in having the oxygen off or on, but got anxious to leave it off long term. Placed back on O2 2L. Will continue to monitor. Call bell within reach.   Domingo Dimes RN

## 2014-05-23 DIAGNOSIS — R0609 Other forms of dyspnea: Secondary | ICD-10-CM

## 2014-05-23 DIAGNOSIS — R0989 Other specified symptoms and signs involving the circulatory and respiratory systems: Secondary | ICD-10-CM

## 2014-05-23 LAB — CBC
HEMATOCRIT: 35.1 % — AB (ref 39.0–52.0)
Hemoglobin: 11.9 g/dL — ABNORMAL LOW (ref 13.0–17.0)
MCH: 32.1 pg (ref 26.0–34.0)
MCHC: 33.9 g/dL (ref 30.0–36.0)
MCV: 94.6 fL (ref 78.0–100.0)
Platelets: 212 10*3/uL (ref 150–400)
RBC: 3.71 MIL/uL — AB (ref 4.22–5.81)
RDW: 13.1 % (ref 11.5–15.5)
WBC: 6 10*3/uL (ref 4.0–10.5)

## 2014-05-23 LAB — GLUCOSE, CAPILLARY
GLUCOSE-CAPILLARY: 121 mg/dL — AB (ref 70–99)
Glucose-Capillary: 112 mg/dL — ABNORMAL HIGH (ref 70–99)
Glucose-Capillary: 129 mg/dL — ABNORMAL HIGH (ref 70–99)
Glucose-Capillary: 101 mg/dL — ABNORMAL HIGH (ref 70–99)

## 2014-05-23 LAB — PROTIME-INR
INR: 1.3 (ref 0.00–1.49)
Prothrombin Time: 16.2 seconds — ABNORMAL HIGH (ref 11.6–15.2)

## 2014-05-23 LAB — HEPARIN LEVEL (UNFRACTIONATED): Heparin Unfractionated: 0.39 IU/mL (ref 0.30–0.70)

## 2014-05-23 LAB — BASIC METABOLIC PANEL
Anion gap: 13 (ref 5–15)
BUN: 15 mg/dL (ref 6–23)
CHLORIDE: 101 meq/L (ref 96–112)
CO2: 23 meq/L (ref 19–32)
CREATININE: 0.86 mg/dL (ref 0.50–1.35)
Calcium: 8.8 mg/dL (ref 8.4–10.5)
GFR calc Af Amer: >90 mL/min (ref >90)
GFR calc non Af Amer: 86 mL/min — ABNORMAL LOW (ref >90)
Glucose, Bld: 109 mg/dL — ABNORMAL HIGH (ref 70–99)
Potassium: 4 mEq/L (ref 3.7–5.3)
Sodium: 137 mEq/L (ref 137–147)

## 2014-05-23 MED ORDER — WARFARIN SODIUM 7.5 MG PO TABS
15.0000 mg | ORAL_TABLET | Freq: Once | ORAL | Status: AC
  Administered 2014-05-23: 15 mg via ORAL
  Filled 2014-05-23: qty 2

## 2014-05-23 NOTE — Progress Notes (Signed)
ANTICOAGULATION CONSULT NOTE - Follow Up Consult  Pharmacy Consult for Heparin and Coumadin Indication: pulmonary embolus  Allergies  Allergen Reactions  . Cephalexin     Unknown  . Doxycycline     Unknown  . Keflex [Cephalexin]   . Lac Bovis Other (See Comments)    lactose intolerant  . Pentazocine Lactate     He passed out- he had a seizure.  This occurred around 2000  . Talwin [Pentazocine]     Patient Measurements: Height: 5' 6"  (167.6 cm) Weight: 201 lb 14.4 oz (91.581 kg) IBW/kg (Calculated) : 63.8 Heparin Dosing Weight: 84 kg  Vital Signs: Temp: 97.7 F (36.5 C) (09/28 1033) Temp src: Oral (09/28 1033) BP: 132/72 mmHg (09/28 1033) Pulse Rate: 64 (09/28 1033)  Labs:  Recent Labs  05/21/14 0453  05/21/14 1934 05/22/14 0354 05/23/14 0500 05/23/14 0530  HGB 12.0*  --   --  11.9*  --  11.9*  HCT 36.2*  --   --  35.0*  --  35.1*  PLT 186  --   --  208  --  212  LABPROT 14.4  --   --  14.5 16.2*  --   INR 1.12  --   --  1.13 1.30  --   HEPARINUNFRC 0.92*  < > 0.55 0.62 0.39  --   CREATININE  --   --   --   --   --  0.86  < > = values in this interval not displayed.  Estimated Creatinine Clearance: 84.7 ml/min (by C-G formula based on Cr of 0.86).  Assessment:  Day # 5 overlap Heparin and Coumadin.  Heparin level remains therapeutic (0.39), but INR is only 1.30, slow to increase.  Has had Coumadin 7.5 mg daily x 2 days, then Coumadin 10 mg daily x 2 days.  Noted that patient was on a Study med via Dr. Leonie Man prior to admission. Patient and wife report this to be Pradaxa, per verbal info from Dr. Leonie Man on admit.  Study med discontinued on admit.    Discussed Coumadin therapy with patient and wife, including precautions, monitoring, and potential drug-drug and drug-food interactions.  Goal of Therapy:  INR 2-3 Heparin level 0.3-0.7 units/ml Monitor platelets by anticoagulation protocol: Yes   Plan:   Continue heparin drip at 1000 units/hr.  Increase  Coumadin to 15 mg x 1 today.  Continue daily heparin level, PT/INR and CBC.  [name], [place] Pager: [phone] 05/23/2014,12:40 PM

## 2014-05-23 NOTE — Progress Notes (Signed)
   Name: LORENSO QUIRINO MRN: [id number] DOB: [birth year]    ADMISSION DATE:  05/19/2014 CONSULTATION DATE:  05/19/14  REFERRING MD :  [name]  CHIEF COMPLAINT:  dyspnea  BRIEF PATIENT DESCRIPTION: 68 y/o male with known CAD admitted on 9/24 with bilateral pulmonary embolism.   SIGNIFICANT EVENTS   STUDIES:  9/24 CT chest >> PE in RUL, RML and bilateral lower lobes; RV:LV 1:1.13 9/25 Echo > LVEF 55-60%, Grade 1 dd, hypokinesis of the apicalanteroseptal myocardium    SUBJECTIVE: Feeling much better, concerned that he hasn't been ambulating. No respiratory complaints.   VITAL SIGNS: Temp:  [97.5 F (36.4 C)-98 F (36.7 C)] 97.7 F (36.5 C) (09/28 1033) Pulse Rate:  [58-67] 64 (09/28 1033) Resp:  [18-19] 18 (09/28 1033) BP: (112-132)/(61-72) 132/72 mmHg (09/28 1033) SpO2:  [95 %-99 %] 98 % (09/28 1033) Weight:  [91.581 kg (201 lb 14.4 oz)] 91.581 kg (201 lb 14.4 oz) (09/28 0517)  PHYSICAL EXAMINATION: General:  Alert, oriented Neuro:  Speech clear, moving all extremities, mild confusion HEENT:  [place]/AT, PERRL Cardiovascular:  RR, no m/g/r, no jvd, no edema Lungs:  Even, unlabored on 2L [place]. Clear breath sounds Abdomen:  Soft, BS present Musculoskeletal:  Normal muscle bulk Skin:  No evident rash or bruising   Recent Labs Lab 05/19/14 1840 05/20/14 0105 05/23/14 0530  NA 137 139 137  K 3.5* 3.5* 4.0  CL 96 98 101  CO2 25 25 23   BUN 20 20 15   CREATININE 0.82 0.86 0.86  GLUCOSE 87 99 109*    Recent Labs Lab 05/21/14 0453 05/22/14 0354 05/23/14 0530  HGB 12.0* 11.9* 11.9*  HCT 36.2* 35.0* 35.1*  WBC 6.2 6.9 6.0  PLT 186 208 212   No results found.  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   Hypertension  Known CAD  -continue preadmission b-blocker, statin   Hypokalemia  -Monitor BMET intermittently  GERD  - PPI   DM2   -continue home meds  -SSI AC/HS   Insomnia - Continue home meds  - PRN ambien  On study through Dr. Clydene Fake office for anti-platelet agent (would also need to be considered if he changes his mind and wants a NOAC)  Likely DC in ~ 48 hours once INR therapeutic.  Georgann Housekeeper, ACNP [facility] Pager [phone] or [phone]  Patient seen and examined, agree with above note.  I dictated the care and orders written for this patient under my direction.  Rush Farmer, MD [phone]

## 2014-05-23 NOTE — Evaluation (Signed)
Physical Therapy Evaluation Patient Details Name: [name] MRN: [id number] DOB: 1945-10-03 Today's Date: 05/23/2014   History of Present Illness  Pt admit with acute PE.    Clinical Impression  Pt admitted with above. Pt currently with functional limitations due to the deficits listed below (see PT Problem List). Pt should progress well.  Has all equipment but would benefit from purchase of pulse oximeter.  MD:  Please write prescriptionn for pulse oximeter for pt.  Will need HHPT as well.  Pt will benefit from skilled PT to increase their independence and safety with mobility to allow discharge to the venue listed below.     Follow Up Recommendations Home health PT;Supervision - Intermittent    Equipment Recommendations  Other (comment) (pulse oximeter)    Recommendations for Other Services       Precautions / Restrictions Precautions Precautions: Fall Restrictions Weight Bearing Restrictions: No      Mobility  Bed Mobility Overal bed mobility: Independent                Transfers Overall transfer level: Needs assistance Equipment used: None Transfers: Sit to/from Stand Sit to Stand: Min guard         General transfer comment: steadying assist needed.   Ambulation/Gait Ambulation/Gait assistance: Min guard Ambulation Distance (Feet): 35 Feet Assistive device: None Gait Pattern/deviations: Step-through pattern;Decreased stride length   Gait velocity interpretation: Below normal speed for age/gender General Gait Details: Pt did not want to go in hallway but ambulated in room.  Sat ok with O2 at 3L.  No LOB however no challenges given.    Stairs            Wheelchair Mobility    Modified Rankin (Stroke Patients Only)       Balance Overall balance assessment: Needs assistance;History of Falls Sitting-balance support: No upper extremity supported;Feet supported Sitting balance-Leahy Scale: Good     Standing balance support: No upper  extremity supported;During functional activity Standing balance-Leahy Scale: Fair Standing balance comment: can stand statically without UE support.  Overall good balance.  DOE 3/4 with O2 at 3L in place.                               Pertinent Vitals/Pain Pain Assessment: No/denies pain VSS.  Sats on 3L02 >94%.  Did not remove due to DOE 3/4.     Home Living Family/patient expects to be discharged to:: Private residence Living Arrangements: Spouse/significant other Available Help at Discharge: Family Type of Home: House Home Access: Stairs to enter Entrance Stairs-Rails: Doctor, general practice of Steps: 5 Home Layout: Multi-level Home Equipment: Environmental consultant - 2 wheels;Walker - 4 wheels;Bedside commode;Shower seat;Tub bench (home O2 -2L)      Prior Function Level of Independence: Needs assistance   Gait / Transfers Assistance Needed: used 2 wheeled RW outdoors, did not use device in home  ADL's / Homemaking Assistance Needed: B/D independently.    Comments: wife works but daughter is coming to stay with pt for a week.       Hand Dominance   Dominant Hand: Left    Extremity/Trunk Assessment   Upper Extremity Assessment: Defer to OT evaluation           Lower Extremity Assessment: Generalized weakness         Communication   Communication: No difficulties  Cognition Arousal/Alertness: Awake/alert Behavior During Therapy: WFL for tasks assessed/performed Overall Cognitive Status: History of  cognitive impairments - at baseline       Memory: Decreased short-term memory              General Comments      Exercises General Exercises - Lower Extremity Ankle Circles/Pumps: AROM;5 reps;Both;Supine      Assessment/Plan    PT Assessment Patient needs continued PT services  PT Diagnosis Generalized weakness   PT Problem List Decreased activity tolerance;Decreased strength;Decreased balance;Decreased mobility;Decreased safety  awareness;Decreased knowledge of use of DME;Decreased knowledge of precautions  PT Treatment Interventions DME instruction;Gait training;Therapeutic activities;Functional mobility training;Therapeutic exercise;Balance training;Stair training;Patient/family education   PT Goals (Current goals can be found in the Care Plan section) Acute Rehab PT Goals Patient Stated Goal: to go home PT Goal Formulation: With patient Time For Goal Achievement: 05/30/14 Potential to Achieve Goals: Good    Frequency Min 3X/week   Barriers to discharge        Co-evaluation               End of Session Equipment Utilized During Treatment: Gait belt;Oxygen Activity Tolerance: Patient limited by fatigue Patient left: in chair;with call bell/phone within reach Nurse Communication: Mobility status         Time: 7846-9629 PT Time Calculation (min): 33 min   Charges:   PT Evaluation $Initial PT Evaluation Tier I: 1 Procedure PT Treatments $Gait Training: 8-22 mins $Therapeutic Activity: 8-22 mins   PT G Codes:          INGOLD,Leiam Hopwood June 08, 2014, 4:47 PM [facility] Acute [facility] [phone] (pager)

## 2014-05-24 LAB — CBC
HEMATOCRIT: 34.4 % — AB (ref 39.0–52.0)
Hemoglobin: 11.8 g/dL — ABNORMAL LOW (ref 13.0–17.0)
MCH: 32.3 pg (ref 26.0–34.0)
MCHC: 34.3 g/dL (ref 30.0–36.0)
MCV: 94.2 fL (ref 78.0–100.0)
Platelets: 212 10*3/uL (ref 150–400)
RBC: 3.65 MIL/uL — ABNORMAL LOW (ref 4.22–5.81)
RDW: 13.1 % (ref 11.5–15.5)
WBC: 6.8 10*3/uL (ref 4.0–10.5)

## 2014-05-24 LAB — GLUCOSE, CAPILLARY
GLUCOSE-CAPILLARY: 118 mg/dL — AB (ref 70–99)
GLUCOSE-CAPILLARY: 98 mg/dL (ref 70–99)
GLUCOSE-CAPILLARY: 123 mg/dL — AB (ref 70–99)
Glucose-Capillary: 114 mg/dL — ABNORMAL HIGH (ref 70–99)

## 2014-05-24 LAB — PROTIME-INR
INR: 1.49 (ref 0.00–1.49)
Prothrombin Time: 18 seconds — ABNORMAL HIGH (ref 11.6–15.2)

## 2014-05-24 LAB — HEPARIN LEVEL (UNFRACTIONATED): Heparin Unfractionated: 0.52 IU/mL (ref 0.30–0.70)

## 2014-05-24 MED ORDER — WARFARIN SODIUM 7.5 MG PO TABS
15.0000 mg | ORAL_TABLET | Freq: Once | ORAL | Status: AC
  Administered 2014-05-24: 15 mg via ORAL
  Filled 2014-05-24: qty 2

## 2014-05-24 NOTE — Progress Notes (Signed)
Physical Therapy Treatment Patient Details Name: [name] MRN: [id number] DOB: 07-06-1946 Today's Date: 05/24/2014    History of Present Illness Pt admit with acute PE.      PT Comments    Pt admitted with above. Pt currently with functional limitations due to endurance deficits.  Making steady progress.   Pt will benefit from skilled PT to increase their independence and safety with mobility to allow discharge to the venue listed below.   Follow Up Recommendations  Home health PT;Supervision - Intermittent     Equipment Recommendations  Other (comment) (pulse oximeter)    Recommendations for Other Services       Precautions / Restrictions Precautions Precautions: Fall Restrictions Weight Bearing Restrictions: No    Mobility  Bed Mobility Overal bed mobility: Independent                Transfers Overall transfer level: Needs assistance Equipment used: None Transfers: Sit to/from Stand Sit to Stand: Min guard         General transfer comment: steadying assist needed.   Ambulation/Gait Ambulation/Gait assistance: Min guard Ambulation Distance (Feet): 175 Feet Assistive device: None Gait Pattern/deviations: Step-through pattern;Decreased stride length   Gait velocity interpretation: Below normal speed for age/gender General Gait Details: Pt able to ambulate without device with good balance overall.  Pt sats 96% and greater with ambulation on RA.  DOE 1/4.     Stairs            Wheelchair Mobility    Modified Rankin (Stroke Patients Only)       Balance Overall balance assessment: Needs assistance;History of Falls Sitting-balance support: No upper extremity supported;Feet supported Sitting balance-Leahy Scale: Good     Standing balance support: No upper extremity supported;During functional activity Standing balance-Leahy Scale: Fair Standing balance comment: can stand statically without UE support.  Overall good balance.  DOE 1/4 on  RA.                    Cognition Arousal/Alertness: Awake/alert Behavior During Therapy: WFL for tasks assessed/performed Overall Cognitive Status: History of cognitive impairments - at baseline       Memory: Decreased short-term memory              Exercises General Exercises - Lower Extremity Ankle Circles/Pumps: AROM;5 reps;Both;Supine Long Arc Quad: AROM;Both;10 reps;Seated    General Comments        Pertinent Vitals/Pain Pain Assessment: 0-10 Pain Score: 6  Pain Location: back Pain Descriptors / Indicators: Aching;Constant Pain Intervention(s): Monitored during session;Limited activity within patient's tolerance;Premedicated before session;Repositioned VSS with sats on RA 96and > throughout ambulation.      Home Living                      Prior Function            PT Goals (current goals can now be found in the care plan section) Progress towards PT goals: Progressing toward goals    Frequency  Min 3X/week    PT Plan Current plan remains appropriate    Co-evaluation             End of Session Equipment Utilized During Treatment: Gait belt;Oxygen Activity Tolerance: Patient limited by fatigue Patient left: with call bell/phone within reach;in bed     Time: 4098-1191 PT Time Calculation (min): 26 min  Charges:  $Gait Training: 8-22 mins $Therapeutic Exercise: 8-22 mins  G Codes:      INGOLD,Adeena Bernabe 05/24/2014, 11:38 AM [facility] [phone] (pager)

## 2014-05-24 NOTE — Progress Notes (Signed)
   Name: [name] MRN: [id number] DOB: 07-04-1946    ADMISSION DATE:  05/19/2014 CONSULTATION DATE:  05/19/14  REFERRING MD :  [name]  CHIEF COMPLAINT:  dyspnea  BRIEF PATIENT DESCRIPTION: 68 y/o male with known CAD admitted on 9/24 with bilateral pulmonary embolism.   SIGNIFICANT EVENTS  9/24: admitted from Dr's office.  9/27: Saturations >92% off oxygen, pt feels more comfortable with oxygen. Ambien started for restlessness  STUDIES:  9/24 CT chest >> PE in RUL, RML and bilateral lower lobes; RV:LV 1:1.13 9/25 Echo > LVEF 55-60%, Grade 1 dd, hypokinesis of the apicalanteroseptal myocardium    SUBJECTIVE: Feeling much better. No respiratory complaints.   VITAL SIGNS: Temp:  [97.9 F (36.6 C)-98.3 F (36.8 C)] 98.3 F (36.8 C) (09/29 1429) Pulse Rate:  [63-69] 66 (09/29 1429) Resp:  [17-18] 18 (09/29 1429) BP: (108-121)/(60-68) 113/60 mmHg (09/29 1429) SpO2:  [96 %-98 %] 97 % (09/29 1429) Weight:  [90 kg (198 lb 6.6 oz)] 90 kg (198 lb 6.6 oz) (09/29 0549)  PHYSICAL EXAMINATION: General:  Alert, awake Neuro:  Speech clear, moving all extremities, mild confusion HEENT:  [place]/AT, PERRL Cardiovascular:  RR, no m/g/r, no jvd, no edema Lungs:  Even, unlabored on 2L [place]. Clear breath sounds Abdomen:  Soft, BS present Musculoskeletal:  Normal muscle bulk Skin:  No evident rash or bruising   Recent Labs Lab 05/19/14 1840 05/20/14 0105 05/23/14 0530  NA 137 139 137  K 3.5* 3.5* 4.0  CL 96 98 101  CO2 25 25 23   BUN 20 20 15   CREATININE 0.82 0.86 0.86  GLUCOSE 87 99 109*    Recent Labs Lab 05/22/14 0354 05/23/14 0530 05/24/14 0456  HGB 11.9* 11.9* 11.8*  HCT 35.0* 35.1* 34.4*  WBC 6.9 6.0 6.8  PLT 208 212 212   No results found.  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   Hypertension  Known CAD  -continue preadmission b-blocker, statin   Hypokalemia  -Monitor BMET intermittently  GERD  - PPI   DM2   -continue home meds  -SSI AC/HS   Insomnia - Continue home meds  - PRN ambien  Anemia -trend CBC  On study through Dr. Clydene Fake office for anti-platelet agent (would also need to be considered if he changes his mind and wants a NOAC)   DC once INR therapeutic.  Patient seen and examined, agree with above note.  I dictated the care and orders written for this patient under my direction.  Rush Farmer, MD [phone]

## 2014-05-24 NOTE — Progress Notes (Signed)
ANTICOAGULATION CONSULT NOTE - Follow Up Consult  Pharmacy Consult for Heparin and Coumadin Indication: pulmonary embolus  Allergies  Allergen Reactions  . Cephalexin     Unknown  . Doxycycline     Unknown  . Keflex [Cephalexin]   . Lac Bovis Other (See Comments)    lactose intolerant  . Pentazocine Lactate     He passed out- he had a seizure.  This occurred around 2000  . Talwin [Pentazocine]     Patient Measurements: Height: 5' 6"  (167.6 cm) Weight: 198 lb 6.6 oz (90 kg) IBW/kg (Calculated) : 63.8 Heparin Dosing Weight: 84 kg  Vital Signs: Temp: 97.9 F (36.6 C) (09/29 1028) Temp src: Oral (09/29 1028) BP: 121/68 mmHg (09/29 1028) Pulse Rate: 68 (09/29 1028)  Labs:  Recent Labs  05/22/14 0354 05/23/14 0500 05/23/14 0530 05/24/14 0456  HGB 11.9*  --  11.9* 11.8*  HCT 35.0*  --  35.1* 34.4*  PLT 208  --  212 212  LABPROT 14.5 16.2*  --  18.0*  INR 1.13 1.30  --  1.49  HEPARINUNFRC 0.62 0.39  --  0.52  CREATININE  --   --  0.86  --     Estimated Creatinine Clearance: 84 ml/min (by C-G formula based on Cr of 0.86).  Assessment:  Day # 6 overlap Heparin and Coumadin.  Heparin level remains therapeutic (0.52) on 1000 units/hr. INR has been slow to increase, but up to 1.49 today. He has had Coumadin 7.5 mg daily x 2 days, then Coumadin 10 mg daily x 2 days, the Coumadin 15 mg x 1.  Noted that patient was on a Study med via Dr. Leonie Man prior to admission. Patient and wife report this to be Pradaxa, per verbal info from Dr. Leonie Man on admit.  Study med discontinued on admit.    Goal of Therapy:  INR 2-3 Heparin level 0.3-0.7 units/ml Monitor platelets by anticoagulation protocol: Yes   Plan:   Continue heparin drip at 1000 units/hr.  Repeat Coumadin to 15 mg today.  Continue daily heparin level, PT/INR and CBC.  [name], [place] Pager: [phone] 05/24/2014,12:00 PM

## 2014-05-25 LAB — PROTIME-INR
INR: 1.76 — AB (ref 0.00–1.49)
PROTHROMBIN TIME: 20.5 s — AB (ref 11.6–15.2)

## 2014-05-25 LAB — GLUCOSE, CAPILLARY
GLUCOSE-CAPILLARY: 117 mg/dL — AB (ref 70–99)
Glucose-Capillary: 131 mg/dL — ABNORMAL HIGH (ref 70–99)

## 2014-05-25 LAB — HEPARIN LEVEL (UNFRACTIONATED): HEPARIN UNFRACTIONATED: 0.25 [IU]/mL — AB (ref 0.30–0.70)

## 2014-05-25 MED ORDER — ZOLPIDEM TARTRATE 5 MG PO TABS: 5.0000 mg | ORAL_TABLET | Freq: Every evening | ORAL | Status: DC | PRN

## 2014-05-25 MED ORDER — ENOXAPARIN SODIUM 100 MG/ML ~~LOC~~ SOLN: 90.0000 mg | Freq: Two times a day (BID) | SUBCUTANEOUS | Status: DC

## 2014-05-25 MED ORDER — BENZONATATE 200 MG PO CAPS: 200.0000 mg | ORAL_CAPSULE | Freq: Two times a day (BID) | ORAL | Status: DC | PRN

## 2014-05-25 MED ORDER — ENOXAPARIN SODIUM 100 MG/ML ~~LOC~~ SOLN
90.0000 mg | Freq: Two times a day (BID) | SUBCUTANEOUS | Status: DC
  Administered 2014-05-25: 90 mg via SUBCUTANEOUS
  Filled 2014-05-25 (×2): qty 1

## 2014-05-25 MED ORDER — WARFARIN SODIUM 5 MG PO TABS: ORAL_TABLET | ORAL | Status: DC

## 2014-05-25 MED ORDER — WARFARIN SODIUM 2.5 MG PO TABS
12.5000 mg | ORAL_TABLET | Freq: Once | ORAL | Status: DC
  Filled 2014-05-25: qty 1

## 2014-05-25 NOTE — Discharge Instructions (Signed)
Be sure to waste 0.55m of your lovenox as it will come a 1077mvial per 1 ml. Your dose is 9043mso you need to take 0.9 ml.

## 2014-05-25 NOTE — Progress Notes (Signed)
Heparin level this am below goal at 0.25; per RN IV line was found to be out ~0300, unclear how long.  Will continue heparin gtt for now and recheck to confirm.  Wynona Neat, PharmD, BCPS 05/25/2014 4:46 AM

## 2014-05-25 NOTE — Progress Notes (Signed)
NP Baccock called and updated of pt ambulation and oxygen results. Pt discharge education and instructions completed with pt and daughter at bedside. Pt voices understanding and denies any questions. Pt IV and telemetry removed; pt handed his prescription for ambien; pt also provided handout information on coumadin education. [facility] called and Pt transported off unit via wheelchair with daughter and belongings at side. Francis Gaines Saylee Sherrill RN.

## 2014-05-25 NOTE — Progress Notes (Signed)
1025 pt Heparin stopped as ordered; pt taught how to self administer Lovenox and pt self adm am dose. Pt voices understanding and denies any questions. Francis Gaines Suzana Sohail RN.

## 2014-05-25 NOTE — Discharge Summary (Signed)
Physician Discharge Summary       Patient ID: [name] MRN: [id number] DOB/AGE: 1946/02/27 68 y.o.  Admit date: 05/19/2014 Discharge date: 05/31/2014  Discharge Diagnoses:  Pulmonary embolism Upper airway Cough Syndrome Pleural Plaque Acute hypoxic respiratory failure  HTN Known CAD  GERD Diabetes Insomnia  Detailed Hospital Course:   This is a pleasant 68 y/o male who saw Dr. Sherene Sires today for shortness of breath. He notes ongoing dyspnea and cough with Hedger mucus production for several years, but in the last month he has had increased dyspnea even at rest. Notably, he was hospitalized for 3 days one month ago for a kidney stone. He was discharged home on oxygen with a presumptive diagnosis of asbestosis. He went to Dr. Sherene Sires on 9/24 for the dyspnea. A CT angiogram was ordered and he was found to have a pulmonary embolism.   He was admitted to the hospital intensive care. ECHO was obtained: ECHO showed EF 55-60%, hypokinesis of the apicalanteroseptal myocardium, grade I diastolic dysfunction, but no evidence of right heart strain. He was treated supportively on Oxygen, IV heparin and coumadin overlap. NOAC therapy were discussed but the pt decided on Coumadin. As of time of discharge he is now on day #7 coumadin/heparin overlap. His INR remains subtherapeutic but he desires to go home and we feel he can safely do this on a LMWH overlap with his coumadin. Prior to discharge he was ambulated to assess Oxygen requirements. His room air O2 sats were > 90% and did not require oxygen.    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 INR remains sub therapeutic. Will send him home on LMWH  and coumadin. Will send him home on 12.5 mg today/10mg  tomorrow and ck INR. Will go on LMWH at 90mg  q12.  -f/u [facility] 10/2 at 10am -plan three months warfarin (given this was a presumed provoked PE) -titrate O2 for SpO2 > 92%, hopefully can d/c in future (started recently for suspected asbestosis, likely PE though)   OSA (felt central) f/b [name]  Plan Resume BIPAP per home settings:  16/8 cm with a rate of 10 per minute.  Hypertension  Known CAD  PLAN -continue preadmission b-blocker, statin   GERD  PLAN - PPI  DM2  PLAN -continue home meds   Insomnia  PLAN - Continue home meds   Anemia  PLAN -trend CBC    Significant Hospital tests/ studies  Consults: pharmacy  ECHO 9/24: EF 55-60%, hypokinesis of the apicalanteroseptal myocardium, grade I diastolic dysfunction, but no evidence of right heart strain.   Discharge Exam: BP 121/67  Pulse 67  Temp(Src) 98.1 F (36.7 C) (Oral)  Resp 17  Ht 5\' 6"  (1.676 m)  Wt 91.3 kg (201 lb 4.5 oz)  BMI 32.50 kg/m2  SpO2 91% 2 liters  General: Alert, awake  Neuro: Speech clear, moving all extremities, mild confusion  HEENT: [place]/AT, PERRL  Cardiovascular: RR, no m/g/r, no jvd, no edema  Lungs: Even, unlabored, clear  Abdomen: Soft, BS present  Musculoskeletal: Normal muscle bulk  Skin: No evident rash or bruising   Labs at discharge Lab Results  Component Value Date   CREATININE 0.94 05/30/2014   BUN 17 05/30/2014  NA 138 05/30/2014   K 3.6 05/30/2014   CL 102 05/30/2014   CO2 20 05/30/2014   Lab Results  Component Value Date   WBC 7.9 05/30/2014   HGB 12.5* 05/30/2014   HCT 38.5* 05/30/2014   MCV 98.1* 05/30/2014   PLT 212 05/24/2014   Lab Results  Component Value Date   ALT 25 11/14/2013   AST 36 11/14/2013   ALKPHOS 73 11/14/2013   BILITOT 0.5 11/14/2013   Lab Results  Component Value Date   INR 2.9 05/27/2014   INR 1.76* 05/25/2014   INR 1.49 05/24/2014    Current radiology studies Dg Chest 2  View  05/30/2014   CLINICAL DATA:  SOB (shortness of breath) R06.02 (ICD-10-CM).  EXAM: CHEST  2 VIEW  COMPARISON:  05/19/2014.  FINDINGS: Trachea is midline. Heart size normal. A loop recorder projects over the left heart. Pleural calcifications are seen bilaterally. Lungs are clear. No pleural fluid.  IMPRESSION: 1. No acute findings. 2. Asbestos related pleural disease.   Electronically Signed   By: Leanna Battles M.D.   On: 05/30/2014 13:20    Disposition:  01-Home or Self Care      Discharge Instructions   Diet - low sodium heart healthy    Complete by:  As directed   Maintain a constant diet.  Avoid large fluctuations in leafy greens See coumadin booklet     Increase activity slowly    Complete by:  As directed             Medication List    STOP taking these medications       metroNIDAZOLE 500 MG tablet  Commonly known as:  FLAGYL     traZODone 150 MG tablet  Commonly known as:  DESYREL      TAKE these medications       atorvastatin 80 MG tablet  Commonly known as:  LIPITOR  Take 1 tablet (80 mg total) by mouth daily.     FLUoxetine 10 MG capsule  Commonly known as:  PROZAC  Take 20 mg by mouth daily.     furosemide 20 MG tablet  Commonly known as:  LASIX  Take 20-40 mg by mouth daily as needed.     methocarbamol 750 MG tablet  Commonly known as:  ROBAXIN  Take 750 mg by mouth every 8 (eight) hours as needed for muscle spasms.     nebivolol 10 MG tablet  Commonly known as:  BYSTOLIC  Take 1 tablet (10 mg total) by mouth 2 (two) times daily.     nitroGLYCERIN 0.4 MG SL tablet  Commonly known as:  NITROSTAT  Place 1 tablet (0.4 mg total) under the tongue every 5 (five) minutes as needed. For chest pain     omeprazole 20 MG capsule  Commonly known as:  PRILOSEC  Take 40 mg by mouth 2 (two) times daily before a meal.     ONE TOUCH ULTRA TEST test strip  Generic drug:  glucose blood     ONETOUCH DELICA LANCETS FINE Misc     sitaGLIPtin 100 MG  tablet  Commonly known as:  JANUVIA  Take 100 mg by mouth daily.     warfarin 5 MG tablet  Commonly known as:  COUMADIN  Take 12.5 mg (2 1/2 tabs) on 9/30, and 10mg  (2 tabs) on 10/1. Then further dosing as directed by coumadin clinic pharmacist     zolpidem 5 MG tablet  Commonly known as:  AMBIEN  Take  1 tablet (5 mg total) by mouth at bedtime as needed for sleep.       Follow-up Information   Follow up with Sandrea Hughs, MD On 06/01/2014. (2pm )    Specialty:  Pulmonary Disease   Contact information:   [address] [place] [address] [phone]       Follow up with [facility] On 05/27/2014. (at 10 am )    Contact information:   [address] [place] [address] [phone]       Discharged Condition: good  Physician Statement:   The Patient was personally examined, the discharge assessment and plan has been personally reviewed and I agree with ACNP Shronda Boeh's assessment and plan. > 30 minutes of time have been dedicated to discharge assessment, planning and discharge instructions.   Signed: Almando Brawley,PETE 05/31/2014, 1:44 PM

## 2014-05-25 NOTE — Progress Notes (Signed)
Pt resting Oxygen was 99-100 on Room Air; pt ambulated 523f in the hallways and the pt didn't require oxygen during ambulation. Pt was sating the high 90's and the lowest he dropped to was 91. Pt didn't go below 91 to 88 during ambulation. PFrancis GainesKuffour RN.

## 2014-05-25 NOTE — Progress Notes (Signed)
ANTICOAGULATION CONSULT NOTE - Follow Up Consult  Pharmacy Consult for Heparin->Lovenox and Coumadin Indication: pulmonary embolus  Allergies  Allergen Reactions  . Cephalexin     Unknown  . Doxycycline     Unknown  . Keflex [Cephalexin]   . Lac Bovis Other (See Comments)    lactose intolerant  . Pentazocine Lactate     He passed out- he had a seizure.  This occurred around 2000  . Talwin [Pentazocine]     Patient Measurements: Height: 5' 6"  (167.6 cm) Weight: 201 lb 4.5 oz (91.3 kg) IBW/kg (Calculated) : 63.8 Heparin Dosing Weight: 84 kg  Vital Signs: Temp: 98.1 F (36.7 C) (09/30 0429) Temp src: Oral (09/30 0429) BP: 121/67 mmHg (09/30 0429) Pulse Rate: 67 (09/30 0429)  Labs:  Recent Labs  05/23/14 0500 05/23/14 0530 05/24/14 0456 05/25/14 0312  HGB  --  11.9* 11.8*  --   HCT  --  35.1* 34.4*  --   PLT  --  212 212  --   LABPROT 16.2*  --  18.0* 20.5*  INR 1.30  --  1.49 1.76*  HEPARINUNFRC 0.39  --  0.52 0.25*  CREATININE  --  0.86  --   --     Estimated Creatinine Clearance: 84.6 ml/min (by C-G formula based on Cr of 0.86).  Assessment:  Day # 7 overlap Heparin and Coumadin.  Heparin level was low this am on 1000 units/hr (IV line found out). Transitioned to Lovenox this morning. Patient reports self-administered. INR has been slow to increase, but up to 1.76 today. He has had Coumadin 7.5 mg daily x 2 days, then Coumadin 10 mg daily x 2 days, then Coumadin 15 mg x 2 days.  Discussed with Jerrye Bushy, NP this am; also discussed with patient and wife.  Noted that patient was on a Study med via Dr. Leonie Man prior to admission. Patient and wife report this to be Pradaxa, per verbal info from Dr. Leonie Man on admit.  Study med discontinued on admit.    Goal of Therapy:  INR 2-3 Anti-Xa level 0.6-1 units/ml 4hrs after LMWH dose given Monitor platelets by anticoagulation protocol: Yes   Plan:   Lovenox 90 mg SQ q12hrs until INR >2.  Coumadin Rx for 5 mg to be  provided; for 12.5 mg tonight, 10 mg 10/1 pm, then PT/INR check on 10/2 am and further instructions to be given.  [name], [place] Pager: [phone] 05/25/2014,11:10 AM

## 2014-05-27 ENCOUNTER — Ambulatory Visit: Payer: Medicare Other

## 2014-05-27 DIAGNOSIS — Z23 Encounter for immunization: Secondary | ICD-10-CM

## 2014-05-27 LAB — POCT INR: INR: 2.9

## 2014-05-30 ENCOUNTER — Ambulatory Visit (INDEPENDENT_AMBULATORY_CARE_PROVIDER_SITE_OTHER): Payer: Medicare Other | Admitting: Emergency Medicine

## 2014-05-30 ENCOUNTER — Ambulatory Visit (INDEPENDENT_AMBULATORY_CARE_PROVIDER_SITE_OTHER): Payer: Medicare Other | Admitting: Internal Medicine

## 2014-05-30 ENCOUNTER — Encounter: Payer: Self-pay | Admitting: Internal Medicine

## 2014-05-30 ENCOUNTER — Ambulatory Visit (INDEPENDENT_AMBULATORY_CARE_PROVIDER_SITE_OTHER): Payer: Medicare Other

## 2014-05-30 VITALS — BP 100/60 | HR 62 | Temp 97.7°F | Ht 66.0 in | Wt 219.0 lb

## 2014-05-30 VITALS — BP 118/70 | HR 74 | Temp 97.8°F | Resp 22 | Ht 66.0 in | Wt 208.8 lb

## 2014-05-30 DIAGNOSIS — E138 Other specified diabetes mellitus with unspecified complications: Secondary | ICD-10-CM

## 2014-05-30 DIAGNOSIS — G4733 Obstructive sleep apnea (adult) (pediatric): Secondary | ICD-10-CM

## 2014-05-30 DIAGNOSIS — R0600 Dyspnea, unspecified: Secondary | ICD-10-CM

## 2014-05-30 DIAGNOSIS — R0602 Shortness of breath: Secondary | ICD-10-CM | POA: Diagnosis not present

## 2014-05-30 DIAGNOSIS — J920 Pleural plaque with presence of asbestos: Secondary | ICD-10-CM | POA: Diagnosis not present

## 2014-05-30 DIAGNOSIS — I2699 Other pulmonary embolism without acute cor pulmonale: Secondary | ICD-10-CM | POA: Diagnosis not present

## 2014-05-30 DIAGNOSIS — R1310 Dysphagia, unspecified: Secondary | ICD-10-CM

## 2014-05-30 DIAGNOSIS — J9610 Chronic respiratory failure, unspecified whether with hypoxia or hypercapnia: Secondary | ICD-10-CM

## 2014-05-30 DIAGNOSIS — I2510 Atherosclerotic heart disease of native coronary artery without angina pectoris: Secondary | ICD-10-CM

## 2014-05-30 LAB — POCT CBC
Granulocyte percent: 66.4 %G (ref 37–80)
HCT, POC: 38.5 % — AB (ref 43.5–53.7)
Hemoglobin: 12.5 g/dL — AB (ref 14.1–18.1)
LYMPH, POC: 1.9 (ref 0.6–3.4)
MCH: 31.9 pg — AB (ref 27–31.2)
MCHC: 32.5 g/dL (ref 31.8–35.4)
MCV: 98.1 fL — AB (ref 80–97)
MID (CBC): 0.8 (ref 0–0.9)
MPV: 7.5 fL (ref 0–99.8)
POC Granulocyte: 5.2 (ref 2–6.9)
POC LYMPH PERCENT: 24.1 %L (ref 10–50)
POC MID %: 9.5 %M (ref 0–12)
Platelet Count, POC: 239 10*3/uL (ref 142–424)
RBC: 3.92 M/uL — AB (ref 4.69–6.13)
RDW, POC: 13.5 %
WBC: 7.9 10*3/uL (ref 4.6–10.2)

## 2014-05-30 LAB — GLUCOSE, POCT (MANUAL RESULT ENTRY): POC Glucose: 88 mg/dl (ref 70–99)

## 2014-05-30 NOTE — Patient Instructions (Signed)
I will ask the coumadin clinic to contact you  re timing for follow up and make sure you are taking the right dose  Continue 2lpm 24/7  See Dr [name] re your abnormal swallowing study to see if anything can be done or you need to be referred to a medical center.   Keep your follow up appointment

## 2014-05-30 NOTE — Progress Notes (Signed)
Subjective:    Patient ID: [name], male    DOB: [birth year], 68 y.o.   MRN: [id number] This chart was scribed for Lesle Chris, MD by Jolene Provost, Medical Scribe. This patient was seen in Room 1 and the patient's care was started at 12:11 PM.  HPI HPI Comments: [name] is a 68 y.o. male with a recent past hx of PE (two weeks ago), on oxygen 2L who presents to [facility] complaining of an acute exacerbation of chronic SOB that started yesterday. Pt states that yesterday he felt a sudden onset episode of SOB with minimal exertion that scared him. Pt states that he had trouble sleeping last night due to his symptoms. Pt is currently on coumadin therapy. Pt denies CP.   Pt states he is also having numbness in his right 3'd finger that started two weeks ago.   Review of Systems  Constitutional: Negative for appetite change and fatigue.  HENT: Negative for congestion, ear discharge and sinus pressure.   Eyes: Negative for discharge.  Respiratory: Positive for shortness of breath. Negative for cough, chest tightness and wheezing.   Cardiovascular: Negative for chest pain.  Gastrointestinal: Negative for diarrhea.  Genitourinary: Negative for frequency and hematuria.  Musculoskeletal: Negative for back pain.  Neurological: Negative for seizures.  Psychiatric/Behavioral: Negative for hallucinations.       Objective:   Physical Exam  Constitutional: He is oriented to person, place, and time. He appears well-developed and well-nourished.  HENT:  Head: Normocephalic and atraumatic.  Eyes: Conjunctivae and EOM are normal. No scleral icterus.  Neck: Neck supple. No thyromegaly present.  Cardiovascular: Normal rate, regular rhythm and normal heart sounds.  Exam reveals no gallop and no friction rub.   No murmur heard. Pulmonary/Chest: No stridor. He has wheezes. He has no rales.  Decreased breath sounds in the bases.  Abdominal: He exhibits no distension. There is no tenderness. There  is no rebound.  Musculoskeletal: Normal range of motion. He exhibits no edema.  Lymphadenopathy:    He has no cervical adenopathy.  Neurological: He is oriented to person, place, and time. He exhibits normal muscle tone. Coordination normal.  Skin: No rash noted. No erythema.  Psychiatric: He has a normal mood and affect. His behavior is normal.   [facility] reading (PRIMARY) by  Dr.Zorianna Taliaferro there are pleural calcifications noted patient is status post coronary artery bypass graft there is a loop recorder visible. Results for orders placed in visit on 05/30/14  POCT CBC      Result Value Ref Range   WBC 7.9  4.6 - 10.2 K/uL   Lymph, poc 1.9  0.6 - 3.4   POC LYMPH PERCENT 24.1  10 - 50 %L   MID (cbc) 0.8  0 - 0.9   POC MID % 9.5  0 - 12 %M   POC Granulocyte 5.2  2 - 6.9   Granulocyte percent 66.4  37 - 80 %G   RBC 3.92 (*) 4.69 - 6.13 M/uL   Hemoglobin 12.5 (*) 14.1 - 18.1 g/dL   HCT, POC 29.5 (*) 28.4 - 53.7 %   MCV 98.1 (*) 80 - 97 fL   MCH, POC 31.9 (*) 27 - 31.2 pg   MCHC 32.5  31.8 - 35.4 g/dL   RDW, POC 13.2     Platelet Count, POC 239  142 - 424 K/uL   MPV 7.5  0 - 99.8 fL  GLUCOSE, POCT (MANUAL RESULT ENTRY)  Result Value Ref Range   POC Glucose 88  70 - 99 mg/dl         Assessment & Plan:  Complicated history of sleep disorder heart disease, abnormal chest x-ray with pleural calcifications and recent hospitalization for bilateral pulmonary emboli. . Dr. Thurston Hole office was kind enough to work the patient in today at 4:15 on O2 at 2 L with the patient oxygen level was 100%. He is under a lot of stress with anxiety because his daughter just flew back to [place].I personally performed the services described in this documentation, which was scribed in my presence. The recorded information has been reviewed and is accurate.I personally performed the services described in this documentation, which was scribed in my presence. The recorded information has been reviewed and is  accurate.

## 2014-05-30 NOTE — Progress Notes (Signed)
Patient ID: [name], male    DOB: 26-Jan-1946  MRN: [id number]    Brief patient profile:  47 yowm never smoker with obesity and osa/appliance dep with h/o ? Asthma as child and then needed inhalers with colds only  since 2004 referred by Dr Cleta Alberts for eval of sob x 2013 after eval by Susa Simmonds for hiccups in 2014 dx'd with ? asbestosis  History of Present Illness  02/11/2013 1st pulmonary eval cc indolent onset progressive doe esp out in hot weather x one year with some dry cough on hot air exposure and even indoors has to stop about 150 yards nl pace with stress test by [facility] March 2014 with nl ef, low risk IHD.  Has used inhalers in past, but not in past year  Has noted some assoc with  swelling but no orthopnea.   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  >>stop metoprolol and ARB , rx bystolic      03/24/2013 f/u ov/Hewitt Garner re sob Chief Complaint  Patient presents with  . Follow-up    Pt states that his breathing is unchanged since the last visit. He c/o increased non prod cough x 1 wk- seems worse with humid weather and in the late evening.   [facility], leg swelling resolved and kidney fx better but breathing no better in hot weather, does fine walking flat in cool climate like a mall. >no change rx  05/04/2013 Follow up and med review  new med calendar - pt brought all meds with him today.  reports breathing somewhat better since last ov.   We reviewed all his medications and organized them into a patient medication calendar with patient education. rec  no change rx   06/09/2013 f/u ov/Olesya Wike re: doe improving  Chief Complaint  Patient presents with  . Follow-up    Pt reports breathing is overall doing better. No new co's today.     Also dry cough for year daytime not noct sev times per day "like a sneeze"  rec For cough as needed use delsym as needed  GERD diet      08/10/2013  Acute  ov/Willeen Novak re: chronic cough  Chief Complaint  Patient  presents with  . Acute Visit    Pt c/o cough x 1 wk-worse for the past 2 days. Cough is prod cough with very minimal clear sputum. Cough is esp worse at night and keeps him awake.   Was better better but cough never went  Completely away-  always coughs at  bedtime  rec Prednisone 10 mg take  4 each am x 2 days,   2 each am x 2 days,  1 each am x 2 days and stop (called in) Add chlortrimeton 4 mg one at bedtime (over the counter) Take delsym two tsp every 12 hours and supplement if needed with Tylenol #3 1 every 4 hours to suppress the urge to cough. Swallowing water or using ice chips/non mint and menthol containing candies (such as lifesavers or sugarless jolly ranchers) are also effective.  You should rest your voice and avoid activities that you know make you cough. Once you have eliminated the cough for 3 straight days try reducing the tylenol #3 first,  then the delsym as tolerated.   GERD diet     09/08/2013 f/u ov/Johnita Palleschi re: cough/ no med calendar Chief Complaint  Patient presents with  . Follow-up    Cough is much improved since last visit.  No new co's today.   cough only deep breath in - not limited by sob from desired activities rec No change rx Return for pfts 05/2014 for pfts   05/18/2014 extended "re-establish" ov/Field Staniszewski re: unexplained hypoxemia/ fits of sob with loss of voice  Chief Complaint  Patient presents with  . Follow-up    Pt c/o increased SOB for the past several months. He was started on supplemental o2 in Aug 2015 during hospital admission.  He states that he gets SOB with walking any distance or even just at rest.   onset of hiccups x one year followed by gradually worsening  cough fits mostly daytime assoc with brief inability to speak lasting sev min then resolves with sob just as likely at rest as with ex Brings up [place] mucus esp at hs on a bipap machine which seems to help sleep, no worse in am Has not tried inhalers  On max acid suppression per Dr Randa Evens  but not diet  [facility], wife now present saying our med sheet is correct but his reporting of previous symptoms is not (he says he was better last ov, she says not). Not clear why hasn't returned in 8 m but apparently dealing with stroke sequelae No swallowing eval in emr   On bipap per neuro "helps a lot" per pt but wife says still waking freq rec CTa > pos PE/ multiple    Admit date: 05/19/2014  Discharge date: 05/25/2014  Discharge Diagnoses:  Pulmonary embolism  Upper airway Cough Syndrome  Pleural Plaque  Acute hypoxic respiratory failure  HTN  Known CAD  GERD  Diabetes  Insomnia  Detailed Hospital Course:  This is a pleasant 68 y/o male who saw Dr. Sherene Sires day prior to ov for shortness of breath. He notes ongoing dyspnea and cough with [place] mucus production for several years, but in the last month he has had increased dyspnea even at rest. Notably, he was hospitalized for 3 days one month prior to admit for a kidney stone. He was discharged home on oxygen with a presumptive diagnosis of asbestosis. He went to Dr. Sherene Sires on 9/23 for the dyspnea. A CT angiogram was ordered and he was found to have a pulmonary embolism.  He was admitted to the hospital intensive care. ECHO was obtained: ECHO showed EF 55-60%, hypokinesis of the apicalanteroseptal myocardium, grade I diastolic dysfunction, but no evidence of right heart strain. He was treated supportively on Oxygen, IV heparin and coumadin overlap. NOAC therapy wa discussed but the pt decided on Coumadin. As of time of discharge he is now on day #7  Heparin but  His INR remains subtherapeutic but he desires to go home and we feel he can safely do this on a LMWH overlap with his coumadin. Prior to discharge he was ambulated to assess Oxygen requirements.  Per nursing notes walked hallway s distress and s 02  Discharge Plan by active problems  Possibly Provoked pulmonary embolism, non-massive as hemodynamically stable  (discussed NOAC vs  warfarin, he prefers warfarin)  Upper airway cough syndrome  Hx of questioned Asbestos related pleural disease> no evidence of asbestosis  Acute hypoxemic respiratory failure > due to PE  PLAN  -now on day #7 overlap. INR remains sub therapeutic. Will send him home on LMWH and coumadin. Will send him home on 12.5 mg today/10mg  tomorrow and ck INR. Will go on LMWH at 90mg  q12.  -f/u [place] [facility] 10/2 at 10am  -plan three months  warfarin (given this was a presumed provoked PE)  -titrate O2 for SpO2 > 92%, hopefully can d/c in future (started recently for suspected asbestosis, likely PE though)  OSA (felt central) f/b [name]  Plan  Resume BIPAP per home settings: 16/8 cm with a rate of 10 per minute.  Hypertension  Known CAD  PLAN  -continue preadmission b-blocker, statin  GERD  PLAN  - PPI  DM2  PLAN  -continue home meds  Insomnia  PLAN  - Continue home meds  Anemia  PLAN  -trend CBC  Significant Hospital tests/ studies  Consults: pharmacy  ECHO 9/24: EF 55-60%, hypokinesis of the apicalanteroseptal myocardium, grade I diastolic dysfunction, but no evidence of right heart strain.    Lab Results   Component  Value  Date    INR  1.76*  05/25/2014    INR  1.49  05/24/2014    INR  1.30  05/23/2014     05/30/14 extended post hosp ov/ transition of care / Geeta Dworkin  On coumadin 5 mg daily per coumadin clinic per pt Chief Complaint  Patient presents with  . HFU    Pt states that there has been no improvement in his breathing since hospital d/c.    very confused with all aspects of care, daughter was helping now flying back to [place] "you should ask her" Wife has pages of redundant and conflicting sheets because she keeps old avs's and refers back to them for his current care.  Cc doe x across the room even on 02 whereas hosp notes say walking entire hallway off 02 s difficulty albeit at slow pace  Continues with burping - not hiccuping, which was another chronic  complaint, with variable dysphagia and abn swallowing eval but has not f/u with Dr Randa Evens yet.   No obvious day to day or daytime variabilty or excess or purulent mucus production or   cp or chest tightness, subjective wheeze overt sinus or hb symptoms. No unusual exp hx or h/o childhood pna/ asthma or knowledge of premature birth.  Sleeping poorly on bipap? Makes belching worse.  Also denies any obvious fluctuation of symptoms with weather or environmental changes or other aggravating or alleviating factors except as outlined above   Current Medications, Allergies, Complete Past Medical History, Past Surgical History, Family History, and Social History were reviewed in [employer] record.  ROS  The following are not active complaints unless bolded sore throat, dysphagia, dental problems, itching, sneezing,  nasal congestion or excess/ purulent secretions, ear ache,   fever, chills, sweats, unintended wt loss, pleuritic or exertional cp, hemoptysis,  orthopnea pnd or leg swelling, presyncope, palpitations, heartburn, abdominal pain, anorexia, nausea, vomiting, diarrhea  or change in bowel or urinary habits, change in stools or urine, dysuria,hematuria,  rash, arthralgias, visual complaints, headache, numbness weakness or ataxia or problems with walking or coordination,  change in mood/affect or memory.              Objective:   Physical Exam  amb obese wm nad/ hopeless helpless affect and attitude, freq belching   08/10/2013     213 > 09/08/2013 214 > 05/18/2014  208 > 05/30/14  219  Wt Readings from Last 3 Encounters:  06/09/13 218 lb (98.884 kg)  05/04/13 223 lb 9.6 oz (101.424 kg)  04/28/13 222 lb 12.8 oz (101.061 kg)        HEENT: nl dentition, turbinates, and orophanx. Nl external ear canals without cough reflex   NECK :  without JVD/Nodes/TM/ nl carotid upstrokes bilaterally   LUNGS: no acc muscle use, clear to A and P bilaterally    CV:  RRR  no s3 or  murmur or increase in P2,  Tr -bilateral ankle edema   ABD:  Obese soft and nontender with nl excursion in the supine position. No bruits or organomegaly, bowel sounds nl  MS:  warm without deformities, calf tenderness, cyanosis or clubbing  SKIN: warm and dry without lesions        CXR  05/18/2014 :  Heart size and pulmonary vascularity are normal. CABG. Calcification in the arch of the aorta. Multiple pleural calcifications bilaterally, stable.  No infiltrates or effusions. No acute osseous abnormality.     Recent Labs Lab 05/18/14 1332  NA 138  K 4.2  CL 103  CO2 30  BUN 19  CREATININE 1.0  GLUCOSE 114*    Recent Labs Lab 05/18/14 1332  HGB 12.1*  HCT 36.5*  WBC 7.4  PLT 209.0     Lab Results  Component Value Date   TSH 2.03 05/18/2014     Lab Results  Component Value Date   PROBNP 69.0 05/18/2014    Lab Results  Component Value Date   DDIMER 2.02* 05/18/2014   CTa 05/19/14 Pulmonary embolus involving right upper lobe, right middle lobe and  bilateral lower lobes. The ventricular ratio suggest right heart  strain. There is no evidence of pulmonary infarct.       Assessment & Plan:

## 2014-05-31 ENCOUNTER — Ambulatory Visit (INDEPENDENT_AMBULATORY_CARE_PROVIDER_SITE_OTHER): Payer: Medicare Other | Admitting: *Deleted

## 2014-05-31 ENCOUNTER — Encounter: Payer: Self-pay | Admitting: Internal Medicine

## 2014-05-31 ENCOUNTER — Ambulatory Visit: Payer: Medicare Other

## 2014-05-31 DIAGNOSIS — Z5181 Encounter for therapeutic drug level monitoring: Secondary | ICD-10-CM | POA: Diagnosis not present

## 2014-05-31 LAB — BASIC METABOLIC PANEL
BUN: 17 mg/dL (ref 6–23)
CALCIUM: 9.5 mg/dL (ref 8.4–10.5)
CO2: 20 meq/L (ref 19–32)
CREATININE: 0.94 mg/dL (ref 0.50–1.35)
Chloride: 102 mEq/L (ref 96–112)
GLUCOSE: 99 mg/dL (ref 70–99)
Potassium: 3.6 mEq/L (ref 3.5–5.3)
Sodium: 138 mEq/L (ref 135–145)

## 2014-05-31 LAB — MDC_IDC_ENUM_SESS_TYPE_REMOTE

## 2014-05-31 NOTE — Assessment & Plan Note (Signed)
Sleeping poorly on bipap per neuro, consider pulmonary sleep doc eval

## 2014-05-31 NOTE — Assessment & Plan Note (Signed)
Placed on 02 at d/c 03/2014    Adequate control on present rx, reviewed > no change in rx needed  But should be able to taper 02 down/ off

## 2014-05-31 NOTE — Assessment & Plan Note (Signed)
[facility] 11/04/2012 1. Normal heart size with changes of CABG. 2. Small hiatal hernia. 3. Bilateral pleural calcifications along the pleura anteriorly and posteriorly with pleural thickening in these locations. This is possibly related to remote inflammatory/traumatic etiology with asbestosis not excluded - repeat CTa 05/19/2014 > no evidence of ILD/ asbestosis   Assured [name] this is not asbestosis and his prognosis for improvement is very good.

## 2014-05-31 NOTE — Assessment & Plan Note (Signed)
[facility]; Dr. Ronald Lobo, MD/ Dr [name]  - DG es 05/19/2014  There was normal pharyngeal anatomy and motility. Contrast flowed freely through the esophagus without evidence of stricture or mass. There was normal esophageal mucosa without evidence of irregularity or ulceration. There are intermittent diffuse esophageal spasms. During the esophageal spasms there is retention of contrast within the esophagus without significant emptying. No evidence of reflux. No definite hiatal hernia was demonstrated.  This plus anxiety may explain his chronic dysphagia and belching with intermittent choking sensation related sometimes to meals and sometimes not.  I will refer him back to Dr [name] as he had tremor from reglan and nothing to offer in this clinic as this is not a lung problem but is at risk of future events like aspiration.  I would have a very low threshold for  tertiary medical evaluation to GI and or ENT (Dr Joya Gaskins at [facility]) evaluation of this problem.

## 2014-05-31 NOTE — Assessment & Plan Note (Addendum)
-   See CTa 05/19/14 and admit > coumadin rx for now per coumadin clinic at [facility] - Echo 05/20/14 Hypokinesis of the distal septal wall with overall normal LV function; grade 1 diastolic dysfunction; normal RV function; trace MRSee CTa 05/19/14 and admit > coumadin rx for now per coumadin clinic at [place] and wife are very confused about details of care ie  who is following for what.  Could not tell me the plan for next coumadin clinic ov  Contacted coumadin clinic at [facility] to be sure f/u arranged and re-inforced that this aspect of his care is the most reversible and prognosis is good esp since RV ok

## 2014-05-31 NOTE — Assessment & Plan Note (Signed)
-   [facility]fts [facility] 12/2012 FEV1  2.1 (71%) ratio 79 and DLCO 57 corrects to 82  - pFT's 06/09/2013  FEV 1 2.10 (78%) ratio 81 and no change p B2 and DLCO 75%   VC = 2.83=77% - 02/11/2013  Walked RA x 3 laps @ 185 ft each stopped due to end of study no desat   - placed on 27 Mar 2014 p admit ? Why needs it?  - 05/18/2014   Walked 2lpm x one lap @ 185 stopped due to legs gave out with sats still 98%  - 05/30/2014   Walked 2lpm  x one lap @ 185 stopped due to  Tired, legs gave out, sats still 94% - CTa 05/19/2014 > POS PE - see PE   His biggest problems now are obesity/ depression/ deconditioning and needs home PT but has apparently arranged this thru the [place]. If this falls thru will need eval by a local home care company > defer to [name]

## 2014-06-01 ENCOUNTER — Inpatient Hospital Stay: Payer: Medicare Other | Admitting: Internal Medicine

## 2014-06-01 ENCOUNTER — Ambulatory Visit (INDEPENDENT_AMBULATORY_CARE_PROVIDER_SITE_OTHER): Payer: Medicare Other | Admitting: *Deleted

## 2014-06-01 DIAGNOSIS — Z5181 Encounter for therapeutic drug level monitoring: Secondary | ICD-10-CM

## 2014-06-01 LAB — POCT INR: INR: 2.6

## 2014-06-02 ENCOUNTER — Other Ambulatory Visit: Payer: Self-pay | Admitting: Emergency Medicine

## 2014-06-02 ENCOUNTER — Telehealth: Payer: Self-pay

## 2014-06-02 ENCOUNTER — Other Ambulatory Visit (HOSPITAL_COMMUNITY): Payer: Self-pay | Admitting: *Deleted

## 2014-06-02 DIAGNOSIS — R0600 Dyspnea, unspecified: Secondary | ICD-10-CM

## 2014-06-02 NOTE — Telephone Encounter (Signed)
[facility] sent orders to Dr DEverlene Farrierfor O2 for pt. Dr DEverlene Farrieradvised that this needs to be managed by Dr WGustavus Bryantoffice. I called and left this message on Lisa Evan's VM and also faxed order back w/Dr [name]'s ph and fax #s.

## 2014-06-03 NOTE — Progress Notes (Signed)
Loop recorder 

## 2014-06-08 ENCOUNTER — Encounter: Payer: Self-pay | Admitting: Internal Medicine

## 2014-06-08 ENCOUNTER — Ambulatory Visit (HOSPITAL_COMMUNITY): Payer: Medicare Other | Attending: Cardiovascular Disease | Admitting: *Deleted

## 2014-06-08 DIAGNOSIS — I10 Essential (primary) hypertension: Secondary | ICD-10-CM | POA: Diagnosis not present

## 2014-06-08 DIAGNOSIS — Z87891 Personal history of nicotine dependence: Secondary | ICD-10-CM | POA: Insufficient documentation

## 2014-06-08 DIAGNOSIS — Z951 Presence of aortocoronary bypass graft: Secondary | ICD-10-CM | POA: Insufficient documentation

## 2014-06-08 DIAGNOSIS — E785 Hyperlipidemia, unspecified: Secondary | ICD-10-CM | POA: Insufficient documentation

## 2014-06-08 DIAGNOSIS — I2510 Atherosclerotic heart disease of native coronary artery without angina pectoris: Secondary | ICD-10-CM | POA: Diagnosis not present

## 2014-06-08 DIAGNOSIS — R0600 Dyspnea, unspecified: Secondary | ICD-10-CM

## 2014-06-08 DIAGNOSIS — I2699 Other pulmonary embolism without acute cor pulmonale: Secondary | ICD-10-CM | POA: Diagnosis not present

## 2014-06-08 DIAGNOSIS — R579 Shock, unspecified: Secondary | ICD-10-CM

## 2014-06-08 NOTE — Progress Notes (Signed)
Venous duplex bilateral lower extremity complete

## 2014-06-09 NOTE — Progress Notes (Signed)
Quick Note:  Spoke with pt and notified of results per Dr. [name]. Pt verbalized understanding and denied any questions.  ______ 

## 2014-06-10 ENCOUNTER — Other Ambulatory Visit: Payer: Self-pay | Admitting: Internal Medicine

## 2014-06-10 ENCOUNTER — Encounter: Payer: Self-pay | Admitting: Internal Medicine

## 2014-06-10 DIAGNOSIS — I2699 Other pulmonary embolism without acute cor pulmonale: Secondary | ICD-10-CM

## 2014-06-10 NOTE — Progress Notes (Signed)
Quick Note:  I reviewed the patient's BiPAP compliance data from 04/15/2014 to 05/14/2014, which is a total of 30 days, during which time the patient used CPAP only on 20 days. It looks like he restarted using his BiPAP on 04/25/2014. The average usage for all days was 10 hours and 8 minutes. The percent used days greater than 4 hours was 67 %, indicating suboptimal compliance. The residual AHI was mildly elevated at 8.1 per hour, indicating a possible suboptimal treatment pressure of 16/8 with a rate of 10. However, with full compliance it is possible that his AHI is less than 5. Air leak was for the most part acceptable but the 95th percentile was 25.1 L per minute. With better compliance we will get a better idea regarding the leak as well. I will review this data with the patient at the next office visit, which is currently not routinely scheduled. We will get in touch with the patient to schedule a followup appointment with me in the next few months and in the interim encouraged him to be fully compliant with BiPAP therapy.   Star Age, MD, PhD [facility] ([facility]   ______

## 2014-06-13 ENCOUNTER — Ambulatory Visit: Payer: Medicare Other | Admitting: Critical Care Medicine

## 2014-06-13 ENCOUNTER — Encounter: Payer: Self-pay | Admitting: Internal Medicine

## 2014-06-13 ENCOUNTER — Ambulatory Visit (INDEPENDENT_AMBULATORY_CARE_PROVIDER_SITE_OTHER): Payer: Medicare Other | Admitting: Internal Medicine

## 2014-06-13 VITALS — BP 100/58 | HR 68 | Temp 98.3°F | Ht 65.0 in | Wt 210.0 lb

## 2014-06-13 DIAGNOSIS — I2699 Other pulmonary embolism without acute cor pulmonale: Secondary | ICD-10-CM | POA: Diagnosis not present

## 2014-06-13 DIAGNOSIS — R0600 Dyspnea, unspecified: Secondary | ICD-10-CM | POA: Diagnosis not present

## 2014-06-13 DIAGNOSIS — I2510 Atherosclerotic heart disease of native coronary artery without angina pectoris: Secondary | ICD-10-CM | POA: Diagnosis not present

## 2014-06-13 DIAGNOSIS — G4733 Obstructive sleep apnea (adult) (pediatric): Secondary | ICD-10-CM | POA: Diagnosis not present

## 2014-06-13 DIAGNOSIS — R05 Cough: Secondary | ICD-10-CM

## 2014-06-13 DIAGNOSIS — R059 Cough, unspecified: Secondary | ICD-10-CM

## 2014-06-13 LAB — PULMONARY FUNCTION TEST
DL/VA % pred: 99 %
DL/VA: 4.25 ml/min/mmHg/L
DLCO unc % pred: 70 %
DLCO unc: 18.12 ml/min/mmHg
FEF 25-75 Post: 2.14 L/sec
FEF 25-75 Pre: 1.86 L/sec
FEF2575-%Change-Post: 15 %
FEF2575-%PRED-POST: 106 %
FEF2575-%Pred-Pre: 92 %
FEV1-%CHANGE-POST: 2 %
FEV1-%Pred-Post: 78 %
FEV1-%Pred-Pre: 76 %
FEV1-PRE: 2.02 L
FEV1-Post: 2.08 L
FEV1FVC-%Change-Post: 3 %
FEV1FVC-%PRED-PRE: 106 %
FEV6-%Change-Post: 0 %
FEV6-%PRED-PRE: 75 %
FEV6-%Pred-Post: 75 %
FEV6-POST: 2.55 L
FEV6-PRE: 2.54 L
FEV6FVC-%CHANGE-POST: 0 %
FEV6FVC-%Pred-Post: 106 %
FEV6FVC-%Pred-Pre: 105 %
FVC-%Change-Post: 0 %
FVC-%PRED-PRE: 70 %
FVC-%Pred-Post: 70 %
FVC-PRE: 2.55 L
FVC-Post: 2.55 L
POST FEV6/FVC RATIO: 100 %
PRE FEV6/FVC RATIO: 100 %
Post FEV1/FVC ratio: 81 %
Pre FEV1/FVC ratio: 79 %

## 2014-06-13 NOTE — Patient Instructions (Addendum)
Let your bipap doctor know that your belching is worse when you put the bipap on at night  Discuss with Dr [name] your abnormal swallowing study from [facility] 05/19/14 to decide what if anything can be done and whether you need to seek care at a medical center  Please schedule a follow up visit in 3 months but call sooner if needed

## 2014-06-13 NOTE — Progress Notes (Signed)
Patient ID: [name], male    DOB: 03-24-1946  MRN: [id number]    Brief patient profile:  22 yowm never smoker with obesity and osa/appliance dep with h/o ? Asthma as child and then needed inhalers with colds only  since 2004 referred by Dr Cleta Alberts for eval of sob x 2013 after eval by Susa Simmonds for hiccups in 2014 dx'd with ? asbestosis  History of Present Illness  02/11/2013 1st pulmonary eval cc indolent onset progressive doe esp out in hot weather x one year with some dry cough on hot air exposure and even indoors has to stop about 150 yards nl pace with stress test by [facility] March 2014 with nl ef, low risk IHD.  Has used inhalers in past, but not in past year  Has noted some assoc with  swelling but no orthopnea.   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  >>stop metoprolol and ARB , rx bystolic      03/24/2013 f/u ov/[name] re sob Chief Complaint  Patient presents with  . Follow-up    Pt states that his breathing is unchanged since the last visit. He c/o increased non prod cough x 1 wk- seems worse with humid weather and in the late evening.   [facility], leg swelling resolved and kidney fx better but breathing no better in hot weather, does fine walking flat in cool climate like a mall. >no change rx  05/04/2013 Follow up and med review  new med calendar - pt brought all meds with him today.  reports breathing somewhat better since last ov.   We reviewed all his medications and organized them into a patient medication calendar with patient education. rec  no change rx   06/09/2013 f/u ov/[name] re: doe improving  Chief Complaint  Patient presents with  . Follow-up    Pt reports breathing is overall doing better. No new co's today.     Also dry cough for year daytime not noct sev times per day "like a sneeze"  rec For cough as needed use delsym as needed  GERD diet      08/10/2013  Acute  ov/[name] re: chronic cough  Chief Complaint  Patient  presents with  . Acute Visit    Pt c/o cough x 1 wk-worse for the past 2 days. Cough is prod cough with very minimal clear sputum. Cough is esp worse at night and keeps him awake.   Was better better but cough never went  Completely away-  always coughs at  bedtime  rec Prednisone 10 mg take  4 each am x 2 days,   2 each am x 2 days,  1 each am x 2 days and stop (called in) Add chlortrimeton 4 mg one at bedtime (over the counter) Take delsym two tsp every 12 hours and supplement if needed with Tylenol #3 1 every 4 hours to suppress the urge to cough. Swallowing water or using ice chips/non mint and menthol containing candies (such as lifesavers or sugarless jolly ranchers) are also effective.  You should rest your voice and avoid activities that you know make you cough. Once you have eliminated the cough for 3 straight days try reducing the tylenol #3 first,  then the delsym as tolerated.   GERD diet     09/08/2013 f/u ov/[name] re: cough/ no med calendar Chief Complaint  Patient presents with  . Follow-up    Cough is much improved since last visit.  No new co's today.   cough only deep breath in - not limited by sob from desired activities rec No change rx Return for pfts 05/2014 for pfts   05/18/2014 extended "re-establish" ov/[name] re: unexplained hypoxemia/ fits of sob with loss of voice  Chief Complaint  Patient presents with  . Follow-up    Pt c/o increased SOB for the past several months. He was started on supplemental o2 in Aug 2015 during hospital admission.  He states that he gets SOB with walking any distance or even just at rest.   onset of hiccups x one year followed by gradually worsening  cough fits mostly daytime assoc with brief inability to speak lasting sev min then resolves with sob just as likely at rest as with ex Brings up [place] mucus esp at hs on a bipap machine which seems to help sleep, no worse in am Has not tried inhalers  On max acid suppression per Dr Randa Evens  but not diet  [facility], wife now present saying our med sheet is correct but his reporting of previous symptoms is not (he says he was better last ov, she says not). Not clear why hasn't returned in 8 m but apparently dealing with stroke sequelae No swallowing eval in emr   On bipap per neuro "helps a lot" per pt but wife says still waking freq rec CTa > pos PE/ multiple    Admit date: 05/19/2014  Discharge date: 05/25/2014  Discharge Diagnoses:  Pulmonary embolism  Upper airway Cough Syndrome  Pleural Plaque  Acute hypoxic respiratory failure  HTN  Known CAD  GERD  Diabetes  Insomnia  Detailed Hospital Course:  This is a pleasant 68 y/o male who saw Dr. Sherene Sires day prior to ov for shortness of breath. He notes ongoing dyspnea and cough with [place] mucus production for several years, but in the last month he has had increased dyspnea even at rest. Notably, he was hospitalized for 3 days one month prior to admit for a kidney stone. He was discharged home on oxygen with a presumptive diagnosis of asbestosis. He went to Dr. Sherene Sires on 9/23 for the dyspnea. A CT angiogram was ordered and he was found to have a pulmonary embolism.  He was admitted to the hospital intensive care. ECHO was obtained: ECHO showed EF 55-60%, hypokinesis of the apicalanteroseptal myocardium, grade I diastolic dysfunction, but no evidence of right heart strain. He was treated supportively on Oxygen, IV heparin and coumadin overlap. NOAC therapy wa discussed but the pt decided on Coumadin. As of time of discharge he is now on day #7  Heparin but  His INR remains subtherapeutic but he desires to go home and we feel he can safely do this on a LMWH overlap with his coumadin. Prior to discharge he was ambulated to assess Oxygen requirements.  Per nursing notes walked hallway s distress and s 02  Discharge Plan by active problems  Possibly Provoked pulmonary embolism, non-massive as hemodynamically stable  (discussed NOAC vs  warfarin, he prefers warfarin)  Upper airway cough syndrome  Hx of questioned Asbestos related pleural disease> no evidence of asbestosis  Acute hypoxemic respiratory failure > due to PE  PLAN  -now on day #7 overlap. INR remains sub therapeutic. Will send him home on LMWH and coumadin. Will send him home on 12.5 mg today/10mg  tomorrow and ck INR. Will go on LMWH at 90mg  q12.  -f/u [facility] 10/2 at 10am  -plan three months  warfarin (given this was a presumed provoked PE)  -titrate O2 for SpO2 > 92%, hopefully can d/c in future (started recently for suspected asbestosis, likely PE though)  OSA (felt central) f/b [name]  Plan  Resume BIPAP per home settings: 16/8 cm with a rate of 10 per minute.  Hypertension  Known CAD  PLAN  -continue preadmission b-blocker, statin  GERD  PLAN  - PPI  DM2  PLAN  -continue home meds  Insomnia  PLAN  - Continue home meds  Anemia  PLAN  -trend CBC  Significant Hospital tests/ studies  Consults: pharmacy  ECHO 9/24: EF 55-60%, hypokinesis of the apicalanteroseptal myocardium, grade I diastolic dysfunction, but no evidence of right heart strain.    Lab Results   Component  Value  Date    INR  1.76*  05/25/2014    INR  1.49  05/24/2014    INR  1.30  05/23/2014     05/30/14 extended post hosp ov/ transition of care / [name]  On coumadin 5 mg daily per coumadin clinic per pt Chief Complaint  Patient presents with  . HFU    Pt states that there has been no improvement in his breathing since hospital d/c.    very confused with all aspects of care, daughter was helping now flying back to [place] "you should ask her" Wife has pages of redundant and conflicting sheets because she keeps old avs's and refers back to them for his current care.  Cc doe x across the room even on 02 whereas hosp notes say walking entire hallway off 02 s difficulty albeit at slow pace rec I will ask the coumadin clinic to contact you  re timing for follow up  and make sure you are taking the right dose Continue 2lpm 24/7 See Dr Randa Evens re your abnormal swallowing study to see if anything can be done or you need to be referred to a medical center.     06/13/2014 f/u ov/[name] re: s/p PE/ ? Component vcd from es dysfunction / has not seen Dr Dorothea Glassman yet  Chief Complaint  Patient presents with  . Follow-up    PFT done today. Breathing is unchanged. No new co's today.   did [employer] on 02 2lpm poc at slow pace  And doing PT twice daily x 1 hour through the [place]  Does fine on bipap per neurology but burps worse when places it on.   Continues with burping - not hiccuping, which was another chronic complaint, with variable dysphagia and abn swallowing eval but has not f/u with Dr Randa Evens yet.   No obvious day to day or daytime variabilty or excess or purulent mucus production or   cp or chest tightness, subjective wheeze overt sinus or hb symptoms. No unusual exp hx or h/o childhood pna/ asthma or knowledge of premature birth.  Sleeping poorly on bipap? Makes belching worse.  Also denies any obvious fluctuation of symptoms with weather or environmental changes or other aggravating or alleviating factors except as outlined above   Current Medications, Allergies, Complete Past Medical History, Past Surgical History, Family History, and Social History were reviewed in [employer] record.  ROS  The following are not active complaints unless bolded sore throat, dysphagia, dental problems, itching, sneezing,  nasal congestion or excess/ purulent secretions, ear ache,   fever, chills, sweats, unintended wt loss, pleuritic or exertional cp, hemoptysis,  orthopnea pnd or leg swelling, presyncope, palpitations, heartburn, abdominal pain, anorexia, nausea, vomiting, diarrhea  or change in bowel or urinary habits, change in stools or urine, dysuria,hematuria,  rash, arthralgias, visual complaints, headache, numbness weakness or ataxia or problems  with walking or coordination,  change in mood/affect or memory.              Objective:   Physical Exam  amb obese wm nad/ hopeless helpless affect and attitude, freq belching   08/10/2013     213 > 09/08/2013 214 > 05/18/2014  208 > 05/30/14  219 > 06/13/2014  210  Wt Readings from Last 3 Encounters:  06/09/13 218 lb (98.884 kg)  05/04/13 223 lb 9.6 oz (101.424 kg)  04/28/13 222 lb 12.8 oz (101.061 kg)        HEENT: nl dentition, turbinates, and orophanx. Nl external ear canals without cough reflex   NECK :  without JVD/Nodes/TM/ nl carotid upstrokes bilaterally   LUNGS: no acc muscle use, clear to A and P bilaterally    CV:  RRR  no s3 or murmur or increase in P2,  Tr -bilateral ankle edema   ABD:  Obese soft and nontender with nl excursion in the supine position. No bruits or organomegaly, bowel sounds nl  MS:  warm without deformities, calf tenderness, cyanosis or clubbing  SKIN: warm and dry without lesions        CXR  05/18/2014 :  Heart size and pulmonary vascularity are normal. CABG. Calcification in the arch of the aorta. Multiple pleural calcifications bilaterally, stable.  No infiltrates or effusions. No acute osseous abnormality.     Recent Labs Lab 05/18/14 1332  NA 138  K 4.2  CL 103  CO2 30  BUN 19  CREATININE 1.0  GLUCOSE 114*    Recent Labs Lab 05/18/14 1332  HGB 12.1*  HCT 36.5*  WBC 7.4  PLT 209.0     Lab Results  Component Value Date   TSH 2.03 05/18/2014     Lab Results  Component Value Date   PROBNP 69.0 05/18/2014    Lab Results  Component Value Date   DDIMER 2.02* 05/18/2014   CTa 05/19/14 Pulmonary embolus involving right upper lobe, right middle lobe and  bilateral lower lobes. The ventricular ratio suggest right heart  strain. There is no evidence of pulmonary infarct.       Assessment & Plan:

## 2014-06-13 NOTE — Progress Notes (Signed)
PFT done today. 

## 2014-06-14 ENCOUNTER — Encounter: Payer: Self-pay | Admitting: Internal Medicine

## 2014-06-14 ENCOUNTER — Ambulatory Visit (INDEPENDENT_AMBULATORY_CARE_PROVIDER_SITE_OTHER): Payer: Self-pay | Admitting: Neurology

## 2014-06-14 DIAGNOSIS — I6340 Cerebral infarction due to embolism of unspecified cerebral artery: Secondary | ICD-10-CM

## 2014-06-14 DIAGNOSIS — Z0289 Encounter for other administrative examinations: Secondary | ICD-10-CM

## 2014-06-14 DIAGNOSIS — I2699 Other pulmonary embolism without acute cor pulmonale: Secondary | ICD-10-CM

## 2014-06-14 DIAGNOSIS — I639 Cerebral infarction, unspecified: Secondary | ICD-10-CM

## 2014-06-14 NOTE — Assessment & Plan Note (Signed)
-   pfts at [facility] 12/2012 FEV1  2.1 (71%) ratio 79 and DLCO 57 corrects to 82  - pFT's 06/09/2013  FEV 1  2.10 (78%) ratio 81 and no change p B2 and DLCO 75%   VC = 2.83=77% - PFTs  06/13/2014  FEV1  2.02 (76%) ratio 79 and no change p B2 and DLCO 70% / corrects to 99 - 02/11/2013  Walked RA x 3 laps @ 185 ft each stopped due to end of study no desat   - placed on 27 Mar 2014 p admit ? Why needs it?  - 05/18/2014   Walked 2lpm x one lap @ 185 stopped due to legs gave out with sats still 98%  - 05/30/2014   Walked 2lpm  x one lap @ 185 stopped due to  Tired, legs gave out, sats still 94%  - CTa 05/19/2014 > POS PE     His chronic sob is most likely obesity/ deconditioning with very little evidence of ILD/asbestosis and the acute worsening requiring 02 likely was related to PE so can likely wean off by next ov    Each maintenance medication was reviewed in detail including most importantly the difference between maintenance and as needed and under what circumstances the prns are to be used.  Please see instructions for details which were reviewed in writing and the patient given a copy.

## 2014-06-14 NOTE — Assessment & Plan Note (Signed)
Defer Rx to neurology unless otherwise referred to our sleep service

## 2014-06-14 NOTE — Progress Notes (Signed)
Saw this patient in this EoT visit today. He is accompanied by his wife. He was on nasal cannula, without respiratory distress. He is on Coumadin now for treatment of PE, which is the reason he will be out of the trial. We will continue to follow him up regardless. He is also going to follow up with Dr. Jannifer Franklin in our practice. Really appreciate his participation in the trial.

## 2014-06-14 NOTE — Assessment & Plan Note (Signed)
Onset 2013  - sinus CT 05/19/2014 > neg - DG es 05/19/2014 > POS  dysmotility/ spasm  This is a chronic complaint and clearly related to the upper airway, es dysfunction, not the lung  Needs re-eval by GI and possible referral to tertiary medical center as can't take reglan per record, defer rx and referral to Dr [name]

## 2014-06-14 NOTE — Assessment & Plan Note (Signed)
See CTa 05/19/14 and admit > coumadin rx for now per coumadin clinic at [facility] - Echo 05/20/14 Hypokinesis of the distal septal wall with overall normal LV function; grade 1 diastolic dysfunction; normal RV function; trace MR - Venous dopplers 06/08/2014 > neg bilaterally   Adequate control on present rx, reviewed > no change in rx needed  X 6 months minimum rx

## 2014-06-15 ENCOUNTER — Ambulatory Visit (INDEPENDENT_AMBULATORY_CARE_PROVIDER_SITE_OTHER): Payer: Medicare Other | Admitting: *Deleted

## 2014-06-15 DIAGNOSIS — Z5181 Encounter for therapeutic drug level monitoring: Secondary | ICD-10-CM

## 2014-06-15 LAB — POCT INR: INR: 1.2

## 2014-06-17 NOTE — Discharge Summary (Signed)
Patient seen and examined, agree with above note.  I dictated the care and orders written for this patient under my direction.  Rush Farmer, MD [phone]

## 2014-06-20 ENCOUNTER — Ambulatory Visit (INDEPENDENT_AMBULATORY_CARE_PROVIDER_SITE_OTHER): Payer: Medicare Other | Admitting: *Deleted

## 2014-06-20 DIAGNOSIS — I639 Cerebral infarction, unspecified: Secondary | ICD-10-CM | POA: Diagnosis not present

## 2014-06-21 LAB — MDC_IDC_ENUM_SESS_TYPE_REMOTE
Date Time Interrogation Session: 20151027025509
MDC IDC SET ZONE DETECTION INTERVAL: 2000 ms
MDC IDC SET ZONE DETECTION INTERVAL: 370 ms
Zone Setting Detection Interval: 3000 ms

## 2014-06-22 ENCOUNTER — Ambulatory Visit (INDEPENDENT_AMBULATORY_CARE_PROVIDER_SITE_OTHER): Payer: Medicare Other | Admitting: *Deleted

## 2014-06-22 DIAGNOSIS — Z5181 Encounter for therapeutic drug level monitoring: Secondary | ICD-10-CM

## 2014-06-22 DIAGNOSIS — G8929 Other chronic pain: Secondary | ICD-10-CM | POA: Diagnosis not present

## 2014-06-22 DIAGNOSIS — M549 Dorsalgia, unspecified: Secondary | ICD-10-CM | POA: Diagnosis not present

## 2014-06-22 DIAGNOSIS — G894 Chronic pain syndrome: Secondary | ICD-10-CM | POA: Diagnosis not present

## 2014-06-22 DIAGNOSIS — M545 Low back pain: Secondary | ICD-10-CM | POA: Diagnosis not present

## 2014-06-22 LAB — POCT INR: INR: 1.5

## 2014-06-24 NOTE — Progress Notes (Signed)
Loop recorder 

## 2014-06-27 ENCOUNTER — Telehealth: Payer: Self-pay

## 2014-06-27 DIAGNOSIS — R1310 Dysphagia, unspecified: Secondary | ICD-10-CM | POA: Diagnosis not present

## 2014-06-27 NOTE — Telephone Encounter (Signed)
I called and left a message for patient or his wife to call me back regarding their upcoming research appointment and payment for research participation. I tried both his home number and cell.

## 2014-06-28 ENCOUNTER — Encounter: Payer: Self-pay | Admitting: Neurology

## 2014-06-28 DIAGNOSIS — N200 Calculus of kidney: Secondary | ICD-10-CM | POA: Diagnosis not present

## 2014-06-29 DIAGNOSIS — N200 Calculus of kidney: Secondary | ICD-10-CM | POA: Diagnosis not present

## 2014-07-04 ENCOUNTER — Telehealth: Payer: Self-pay

## 2014-07-04 ENCOUNTER — Other Ambulatory Visit: Payer: Self-pay

## 2014-07-04 MED ORDER — FUROSEMIDE 20 MG PO TABS: ORAL_TABLET | ORAL | Status: DC

## 2014-07-04 NOTE — Telephone Encounter (Signed)
Spoke to [facility] in refills advised ok to refill patient's lasix 20 mg 1 to 2 tablets daily as needed.Advised to keep appointment with Richardson Dopp PA 08/01/14 at 3:20 pm.

## 2014-07-05 ENCOUNTER — Encounter: Payer: Self-pay | Admitting: Neurology

## 2014-07-06 ENCOUNTER — Ambulatory Visit (INDEPENDENT_AMBULATORY_CARE_PROVIDER_SITE_OTHER): Payer: Medicare Other

## 2014-07-06 DIAGNOSIS — Z5181 Encounter for therapeutic drug level monitoring: Secondary | ICD-10-CM

## 2014-07-06 LAB — POCT INR: INR: 2

## 2014-07-08 DIAGNOSIS — N200 Calculus of kidney: Secondary | ICD-10-CM | POA: Diagnosis not present

## 2014-07-13 ENCOUNTER — Encounter: Payer: Self-pay | Admitting: Neurology

## 2014-07-18 DIAGNOSIS — N200 Calculus of kidney: Secondary | ICD-10-CM | POA: Diagnosis not present

## 2014-07-19 ENCOUNTER — Ambulatory Visit (INDEPENDENT_AMBULATORY_CARE_PROVIDER_SITE_OTHER): Payer: Medicare Other | Admitting: *Deleted

## 2014-07-19 DIAGNOSIS — I639 Cerebral infarction, unspecified: Secondary | ICD-10-CM

## 2014-07-19 LAB — MDC_IDC_ENUM_SESS_TYPE_REMOTE
MDC IDC SESS DTM: 20151102144707
MDC IDC SET ZONE DETECTION INTERVAL: 2000 ms
Zone Setting Detection Interval: 3000 ms
Zone Setting Detection Interval: 370 ms

## 2014-07-20 ENCOUNTER — Ambulatory Visit (INDEPENDENT_AMBULATORY_CARE_PROVIDER_SITE_OTHER): Payer: Medicare Other

## 2014-07-20 DIAGNOSIS — M545 Low back pain: Secondary | ICD-10-CM | POA: Diagnosis not present

## 2014-07-20 DIAGNOSIS — G894 Chronic pain syndrome: Secondary | ICD-10-CM | POA: Diagnosis not present

## 2014-07-20 DIAGNOSIS — Z5181 Encounter for therapeutic drug level monitoring: Secondary | ICD-10-CM

## 2014-07-20 DIAGNOSIS — M791 Myalgia: Secondary | ICD-10-CM | POA: Diagnosis not present

## 2014-07-20 DIAGNOSIS — J449 Chronic obstructive pulmonary disease, unspecified: Secondary | ICD-10-CM | POA: Diagnosis not present

## 2014-07-20 DIAGNOSIS — G8929 Other chronic pain: Secondary | ICD-10-CM | POA: Diagnosis not present

## 2014-07-20 LAB — POCT INR: INR: 2.6

## 2014-07-25 ENCOUNTER — Ambulatory Visit: Payer: Medicare Other | Admitting: Physician Assistant

## 2014-07-27 NOTE — Progress Notes (Signed)
Loop recorder 

## 2014-07-28 ENCOUNTER — Other Ambulatory Visit: Payer: Self-pay | Admitting: Cardiology

## 2014-08-01 ENCOUNTER — Ambulatory Visit (INDEPENDENT_AMBULATORY_CARE_PROVIDER_SITE_OTHER): Payer: Medicare Other | Admitting: Physician Assistant

## 2014-08-01 ENCOUNTER — Encounter: Payer: Self-pay | Admitting: Physician Assistant

## 2014-08-01 VITALS — BP 120/70 | HR 96 | Ht 65.0 in | Wt 209.0 lb

## 2014-08-01 DIAGNOSIS — R0602 Shortness of breath: Secondary | ICD-10-CM | POA: Diagnosis not present

## 2014-08-01 DIAGNOSIS — I5032 Chronic diastolic (congestive) heart failure: Secondary | ICD-10-CM | POA: Diagnosis not present

## 2014-08-01 DIAGNOSIS — I10 Essential (primary) hypertension: Secondary | ICD-10-CM | POA: Diagnosis not present

## 2014-08-01 DIAGNOSIS — I6340 Cerebral infarction due to embolism of unspecified cerebral artery: Secondary | ICD-10-CM

## 2014-08-01 DIAGNOSIS — M533 Sacrococcygeal disorders, not elsewhere classified: Secondary | ICD-10-CM | POA: Diagnosis not present

## 2014-08-01 DIAGNOSIS — I2510 Atherosclerotic heart disease of native coronary artery without angina pectoris: Secondary | ICD-10-CM

## 2014-08-01 DIAGNOSIS — I6521 Occlusion and stenosis of right carotid artery: Secondary | ICD-10-CM

## 2014-08-01 DIAGNOSIS — I639 Cerebral infarction, unspecified: Secondary | ICD-10-CM

## 2014-08-01 DIAGNOSIS — G4733 Obstructive sleep apnea (adult) (pediatric): Secondary | ICD-10-CM | POA: Diagnosis not present

## 2014-08-01 DIAGNOSIS — I2699 Other pulmonary embolism without acute cor pulmonale: Secondary | ICD-10-CM

## 2014-08-01 DIAGNOSIS — Z95818 Presence of other cardiac implants and grafts: Secondary | ICD-10-CM | POA: Diagnosis not present

## 2014-08-01 DIAGNOSIS — M461 Sacroiliitis, not elsewhere classified: Secondary | ICD-10-CM | POA: Diagnosis not present

## 2014-08-01 DIAGNOSIS — M545 Low back pain: Secondary | ICD-10-CM | POA: Diagnosis not present

## 2014-08-01 NOTE — Patient Instructions (Signed)
Your physician wants you to follow-up in:  4 months with Dr. Martinique. You will receive a reminder letter in the mail two months in advance. If you don't receive a letter, please call our office to schedule the follow-up appointment.

## 2014-08-01 NOTE — Progress Notes (Signed)
Cardiology Office Note   Date:  08/01/2014   ID:  Jacob, Sharp 27-Jan-1946, MRN [id number]  PCP:  [name], MD  Cardiologist:  Dr. Peter Martinique   Electrophysiologist:  Dr. Virl Axe    History of Present Illness: [name] is a 68 y.o. male with a hx of CAD, s/p CABG in 2005, HTN, HL, seizure d/o, sleep apnea, AFlutter, diverticulosis s/p partial colectomy. He was admitted for syncope in 8/13 thought to be related to seizure activity and he was started on Keppra at that time.He developed worsening creatinine (0.99=> 2.72) last year with increased doses of Lasix for edema. Patient's ARB was discontinued and he was referred to nephrology. Renal Dopplers were negative for renal artery stenosis.  He has chronic dyspnea and has been evaluated by pulmonology.  This is felt to be related to obesity and deconditioning with little evidence of ILD/asbestosis.    Last seen by Dr. Martinique 04/2013. He was seen by cardiology during an admission in 10/2013 for cryptogenic CVA. TEE demonstrated normal LV function with no cardiac source of embolism. He underwent implantation of an ILR.  Admitted in 03/2014 for symptomatic nephrolithiasis.  Admitted 04/2014 with chest CTA evidence of pulmonary embolism. He was placed on Coumadin for anticoagulation. Echocardiogram demonstrated normal LV function and no evidence of right heart strain. Pulmonary embolism was felt to be provoked given recent admission to the hospital in 3 months of anticoagulation therapy has been recommended.   He returns for follow-up. He is here with his wife. He is on chronic O2. He denies chest discomfort. He has chronic dyspnea. He is NYHA 3-3b. He sleeps on 2 pillows chronically. He denies PND. LE edema is fairly well controlled on his current dose of Lasix. He denies syncope or syncope. He has a chronic cough with [place] sputum production.   Studies:   - Echo 2/08: EF 55-60%, mild increased AV thickness, echo free space  adjacent to apex c/w small eff vs fat pad.  - Echo (9/15): Mild LVH, EF 55-60%, apical, anteroseptal hypokinesis, grade 1 diastolic dysfunction  - Lexiscan Myoview 10/26/12: EF 62%, inferior defect (diaphragmatic attenuation versus artifact versus small area of scar), no ischemia; low risk.  - Carotid US (3/15):  Bilateral ICA 1-39%  - Neck CTA (8/15):  R ICA supraclinoid 70%  - Renal artery [place] (7/14): No RAS   Recent Labs: 10/29/2013: LDL (calc) NOT CALC 11/14/2013: ALT 25 05/18/2014: TSH 2.03 05/19/2014: Pro B Natriuretic peptide (BNP) 192.3* 05/30/2014: BUN 17; Creatinine 0.94; Hemoglobin 12.5*; Potassium 3.6; Sodium 138    Recent Radiology: No results found.    Wt Readings from Last 3 Encounters:  06/13/14 210 lb (95.255 kg)  05/30/14 219 lb (99.338 kg)  05/30/14 208 lb 12.8 oz (94.711 kg)     Past Medical History  Diagnosis Date  . Coronary artery disease     a. Low level exercise Lex MV 3/14: low risk, EF 62%, inf defect 2/2 diaph attenuation vs artifiact, small area of scar possible, no ischemia  . Hyperlipidemia   . Hypertension   . History of seizure disorder   . History of atrial flutter   . Depression   . Back pain     persistent  . Hearing loss   . Diverticulitis   . H/O alcohol abuse   . Diabetes mellitus     controled by diet  . GERD (gastroesophageal reflux disease)   . Seizures   . Headache(784.0)   .  Arthritis     of spine  . Sleep apnea     does not use CPAP  . Hx of echocardiogram     Echo 7/14:  Mod LVH, EF 55-60%, Gr 1 DD, mild MR, PASP 36  . AKI (acute kidney injury)     a. 6/14: resolved after d/c ARB;  b. RA U/S 7/14: no RA stenosis  . Atrial flutter   . Stroke   . Shortness of breath   . Renal stone 04/15/2014    Current Outpatient Prescriptions  Medication Sig Dispense Refill  . atorvastatin (LIPITOR) 80 MG tablet Take 1 tablet (80 mg total) by mouth daily. 30 tablet 11  . FLUoxetine (PROZAC) 10 MG capsule Take 20 mg by mouth daily.      . furosemide (LASIX) 20 MG tablet TAKE 1 TO 2 TABLETS BY MOUTH DAILY AS NEEDED 60 tablet 0  . glucose blood (ONE TOUCH ULTRA TEST) test strip     . methocarbamol (ROBAXIN) 750 MG tablet Take 750 mg by mouth every 8 (eight) hours as needed for muscle spasms.     . metroNIDAZOLE (FLAGYL) 500 MG tablet Take 500 mg by mouth daily.    . nebivolol (BYSTOLIC) 10 MG tablet Take 1 tablet (10 mg total) by mouth 2 (two) times daily. 60 tablet 5  . nitroGLYCERIN (NITROSTAT) 0.4 MG SL tablet Place 1 tablet (0.4 mg total) under the tongue every 5 (five) minutes as needed. For chest pain 25 tablet 0  . omeprazole (PRILOSEC) 20 MG capsule Take 40 mg by mouth 2 (two) times daily before a meal.     . ONETOUCH DELICA LANCETS FINE MISC     . sitaGLIPtin (JANUVIA) 100 MG tablet Take 100 mg by mouth daily.    . traZODone (DESYREL) 150 MG tablet Take 150 mg by mouth at bedtime.    Marland Kitchen warfarin (COUMADIN) 5 MG tablet Take 12.5 mg (2 1/2 tabs) on 9/30, and 63m (2 tabs) on 10/1. Then further dosing as directed by coumadin clinic pharmacist 60 tablet 3   No current facility-administered medications for this visit.     Allergies:   Cephalexin; Doxycycline; Keflex; Lac bovis; Pentazocine lactate; and Talwin   Social History:  The patient  reports that he has never smoked. He has never used smokeless tobacco. He reports that he does not drink alcohol or use illicit drugs.   Family History:  The patient's family history includes Asthma in his mother; Emphysema in his mother; Heart disease in his father; Pancreatic cancer in his paternal uncle; Prostate cancer in his paternal grandfather.    ROS:  Please see the history of present illness.   All other systems reviewed and negative.    PHYSICAL EXAM: VS:  BP 120/70 mmHg  Pulse 96  Ht 5' 5"  (1.651 m)  Wt 209 lb (94.802 kg)  BMI 34.78 kg/m2 Chronically ill-appearing male, in no acute distress, on O2 HEENT: normal Neck: No JVDAt 90 Cardiac:  normal S1, S2; RRR; no  murmur  Lungs:  Decreased breath sounds bilaterally, no wheezing, rhonchi or rales Abd: soft, nontender, no hepatomegaly Ext: Trace bilateral LE edema Skin: warm and dry Neuro:  CNs 2-12 intact, no focal abnormalities noted   ASSESSMENT AND PLAN:  1.  Coronary artery disease: No angina. He has not on aspirin as he is on warfarin. Continue statin, beta blocker. 2.  Pulmonary embolism:  Warfarin as managed by pulmonology. 3.  Embolic stroke Status post placement of implantable loop recorder:  No evidence of atrial fibrillation thus far. He is currently on warfarin in the setting of recent pulmonary embolism. 4.  Essential hypertension: Controlled. 5.  Obstructive sleep apnea: Continue CPAP. 6.  Shortness of breath:  He is followed by pulmonology. This is likely multifactorial and related to obesity, deconditioning, diastolic CHF and pulmonary embolism.  7.  Chronic diastolic CHF (congestive heart failure):  Volume stable. Continue current dose of Lasix. Recent creatinine stable. 8.  Carotid Stenosis: 70% RICA on neck CTA in 03/2014. Managed by neurology.  Disposition:   FU with Dr. Martinique in 4 months.   Signed, [name], MHS 08/01/2014 2:22 PM    [facility] [address], [place], [place]  [address] Phone: [phone]; Fax: [phone]

## 2014-08-04 ENCOUNTER — Encounter (HOSPITAL_COMMUNITY): Payer: Self-pay | Admitting: Interventional Cardiology

## 2014-08-09 DIAGNOSIS — R1310 Dysphagia, unspecified: Secondary | ICD-10-CM | POA: Diagnosis not present

## 2014-08-09 DIAGNOSIS — G4730 Sleep apnea, unspecified: Secondary | ICD-10-CM | POA: Diagnosis not present

## 2014-08-09 DIAGNOSIS — G4733 Obstructive sleep apnea (adult) (pediatric): Secondary | ICD-10-CM | POA: Diagnosis not present

## 2014-08-09 DIAGNOSIS — K912 Postsurgical malabsorption, not elsewhere classified: Secondary | ICD-10-CM | POA: Diagnosis not present

## 2014-08-09 DIAGNOSIS — K224 Dyskinesia of esophagus: Secondary | ICD-10-CM | POA: Diagnosis not present

## 2014-08-09 DIAGNOSIS — R197 Diarrhea, unspecified: Secondary | ICD-10-CM | POA: Diagnosis not present

## 2014-08-15 ENCOUNTER — Telehealth: Payer: Self-pay | Admitting: Internal Medicine

## 2014-08-15 NOTE — Telephone Encounter (Signed)
Spoke with Baxter Flattery with Dr Kalman Shan office, aware of rec's per MW.  Nothing further needed.

## 2014-08-15 NOTE — Telephone Encounter (Signed)
Venous dopplers are ok and so is echo so ok to be off x 5 days prior and restart asap after procedure

## 2014-08-15 NOTE — Telephone Encounter (Signed)
Spoke with Dr. Kathaleen Grinder nurse, states that pt needs to have a tooth extracted.  Since he is on coumadin, Dr. Kalman Shan did not feel comfortable extracting tooth today.  Is requesting recs on coumadin snice MW is the one who started the coumadin.  Dr. [name] please advise.  Thanks!

## 2014-08-17 ENCOUNTER — Ambulatory Visit: Payer: Medicare Other | Admitting: Family Medicine

## 2014-08-17 DIAGNOSIS — Z5181 Encounter for therapeutic drug level monitoring: Secondary | ICD-10-CM

## 2014-08-17 LAB — POCT INR: INR: 2

## 2014-08-18 ENCOUNTER — Ambulatory Visit (INDEPENDENT_AMBULATORY_CARE_PROVIDER_SITE_OTHER): Payer: Medicare Other | Admitting: *Deleted

## 2014-08-18 DIAGNOSIS — I639 Cerebral infarction, unspecified: Secondary | ICD-10-CM

## 2014-08-18 LAB — MDC_IDC_ENUM_SESS_TYPE_REMOTE

## 2014-08-23 ENCOUNTER — Other Ambulatory Visit: Payer: Self-pay | Admitting: Internal Medicine

## 2014-08-23 ENCOUNTER — Other Ambulatory Visit: Payer: Self-pay | Admitting: Cardiology

## 2014-08-24 NOTE — Progress Notes (Signed)
Loop recorder 

## 2014-08-29 ENCOUNTER — Encounter: Payer: Self-pay | Admitting: Internal Medicine

## 2014-08-31 ENCOUNTER — Ambulatory Visit: Payer: Medicare Other | Admitting: Family Medicine

## 2014-08-31 DIAGNOSIS — Z5181 Encounter for therapeutic drug level monitoring: Secondary | ICD-10-CM | POA: Diagnosis not present

## 2014-08-31 DIAGNOSIS — M791 Myalgia: Secondary | ICD-10-CM | POA: Diagnosis not present

## 2014-08-31 DIAGNOSIS — M545 Low back pain: Secondary | ICD-10-CM | POA: Diagnosis not present

## 2014-08-31 DIAGNOSIS — G894 Chronic pain syndrome: Secondary | ICD-10-CM | POA: Diagnosis not present

## 2014-08-31 DIAGNOSIS — G8929 Other chronic pain: Secondary | ICD-10-CM | POA: Diagnosis not present

## 2014-08-31 LAB — POCT INR: INR: 2.6

## 2014-09-09 ENCOUNTER — Other Ambulatory Visit: Payer: Self-pay | Admitting: Internal Medicine

## 2014-09-09 ENCOUNTER — Telehealth: Payer: Self-pay | Admitting: *Deleted

## 2014-09-09 NOTE — Telephone Encounter (Signed)
Called patient ans changed the scheduled appointment patient understood

## 2014-09-14 ENCOUNTER — Ambulatory Visit: Payer: Medicare Other | Admitting: Nurse Practitioner

## 2014-09-14 DIAGNOSIS — N200 Calculus of kidney: Secondary | ICD-10-CM | POA: Diagnosis not present

## 2014-09-16 ENCOUNTER — Ambulatory Visit (INDEPENDENT_AMBULATORY_CARE_PROVIDER_SITE_OTHER): Payer: Medicare Other | Admitting: *Deleted

## 2014-09-16 DIAGNOSIS — I6340 Cerebral infarction due to embolism of unspecified cerebral artery: Secondary | ICD-10-CM | POA: Diagnosis not present

## 2014-09-16 DIAGNOSIS — I639 Cerebral infarction, unspecified: Secondary | ICD-10-CM

## 2014-09-22 ENCOUNTER — Encounter: Payer: Self-pay | Admitting: Internal Medicine

## 2014-09-22 NOTE — Progress Notes (Signed)
Loop recorder 

## 2014-09-27 ENCOUNTER — Encounter: Payer: Self-pay | Admitting: Neurology

## 2014-09-27 ENCOUNTER — Ambulatory Visit (INDEPENDENT_AMBULATORY_CARE_PROVIDER_SITE_OTHER): Payer: Medicare Other | Admitting: Adult Health

## 2014-09-27 ENCOUNTER — Encounter: Payer: Self-pay | Admitting: Adult Health

## 2014-09-27 VITALS — BP 103/64 | HR 73 | Ht 65.0 in | Wt 206.0 lb

## 2014-09-27 DIAGNOSIS — I6930 Unspecified sequelae of cerebral infarction: Secondary | ICD-10-CM | POA: Diagnosis not present

## 2014-09-27 DIAGNOSIS — G4733 Obstructive sleep apnea (adult) (pediatric): Secondary | ICD-10-CM

## 2014-09-27 DIAGNOSIS — I6340 Cerebral infarction due to embolism of unspecified cerebral artery: Secondary | ICD-10-CM | POA: Diagnosis not present

## 2014-09-27 DIAGNOSIS — R413 Other amnesia: Secondary | ICD-10-CM | POA: Diagnosis not present

## 2014-09-27 NOTE — Patient Instructions (Signed)
Continue Coumadin.  Maintain strict control of hypertension with blood pressure goal below 130/90, lipids with an LDL cholesterol goal below 100 mg percent. Keep Hemoglobin A1C < 6.5 %

## 2014-09-27 NOTE — Progress Notes (Addendum)
PATIENT: [name] DOB: March 05, 1946  REASON FOR VISIT: follow up- stroke, OSA, memory loss HISTORY FROM: patient  HISTORY OF PRESENT ILLNESS: [name] is a 69 year old male with a history of OSA on BIPAP and multiple strokes. He returns today for follow-up. The patient is currently taking coumadin for blood clots. In October he had a PE and for that reason had to remove himself from the research study here at [facility]. The patient in currently on Lipitor for cholesterol. He states that lab work was recently drawn at the [place] but he has not gotten the result. Patient does check his sugars they usually run between 90-140. He is currently taking Januvia. Unsure what is last hemoglobin A1C is. Patient blood pressure has been running <130 SBP. He takes bystolic. He denies any stroke-like symptoms. Wife reports that the left leg is weak from the stroke and she also noticed some short term memory loss after the stroke. He walks with a Rolator. Denies any falls.  He has OSA on CPAP and is followed by Dr. Rexene Sharp- He has a visit next week to f/u with OSA.  Today we did a wireless download for 30 days that showed a compliance of 100%. He uses his machine on average 12 hours and 24 minutes. IPAP is 16 cmH2O and EPAP 8cmH2O. AHI 0.8. Leak at 22.6 L/min at 95th percentile. Patient states that he uses his machine nightly and when he naps. He also wears oxygen daily. He started this after being hospitalized during the summer for kidney stones. While there they noticed fluctuations in his oxygen levels. Overall the patient has been doing well.   HISTORY 12/07/13 ([name]): 44 year Caucasian male seen for.firstoffice visit following hospital admission for stroke. He was admitted on 11/18/13 with sudden onset of left leg weakness and tripping and falling. Head CT scan showed a subacute right frontal infarct an MRI subsequently confirmed that. MRA showed no large vessel stenosis and carotid dopplers were unremarkable.There is a  questionable history of atrial arrhythmia but documented on his problem list But after extensive review of his prior medical records and discussion with family no definite atrial fibrillation was documented patient has never been on anticoagulation . Hence he got a complete workup including transthoracic echo which was normal and TEE which showed no obvious cardiac source of embolism or PFO. He had an implantable loop recorder placed to look for paroxysmal atrial fibrillation. He was started on aspirin for stroke prevention. Vascular cycle and if) obesity, sleep apnea, attention, hyperlipidemia and diabetes. Patient congenital morning will regular sinus rhythm. Patient states his general condition is not having recurrent stroke or chest symptoms clearly skull has been well excreted recorder has so far not recorded any atrial fibrillation. Patient does have sleep apnea but states that he has not tolerated CPAP in the past. He admits to restless sleep, snoring feeling excessively tired during the day. His wife in fact is scheduled sleep in the same room. Patient's wife is concerned about his ability to drive and she feels his concentration is not good and he gets confused at times. Patient to have has finished home physical occupational therapy  REVIEW OF SYSTEMS: Out of a complete 14 system review of symptoms, the patient complains only of the following symptoms, and all other reviewed systems are negative.  Ringing in ears, cough, activity change, diarrhea, restless leg, daytime sleepiness, cold intolerance, joint pain, back pain, walking difficulty, itching, headache, agitation, decreased concentration, depression.  ALLERGIES: Allergies  Allergen Reactions  . Cephalexin     Unknown  . Doxycycline     Unknown  . Keflex [Cephalexin]   . Lac Bovis Other (See Comments)    lactose intolerant  . Pentazocine Lactate     He passed out- he had a seizure.  This occurred around 2000  . Talwin  [Pentazocine]     HOME MEDICATIONS: Outpatient Prescriptions Prior to Visit  Medication Sig Dispense Refill  . atorvastatin (LIPITOR) 80 MG tablet Take 1 tablet (80 mg total) by mouth daily. 30 tablet 11  . BYSTOLIC 10 MG tablet TAKE 1 TABLET BY MOUTH TWICE DAILY 60 tablet 0  . FLUoxetine (PROZAC) 10 MG capsule Take 20 mg by mouth daily.    . furosemide (LASIX) 20 MG tablet TAKE 1 TO 2 TABLETS BY MOUTH DAILY AS NEEDED 60 tablet 1  . glucose blood (ONE TOUCH ULTRA TEST) test strip     . methocarbamol (ROBAXIN) 750 MG tablet Take 750 mg by mouth every 8 (eight) hours as needed for muscle spasms.     . metroNIDAZOLE (FLAGYL) 500 MG tablet Take 500 mg by mouth daily.    . nitroGLYCERIN (NITROSTAT) 0.4 MG SL tablet Place 1 tablet (0.4 mg total) under the tongue every 5 (five) minutes as needed. For chest pain 25 tablet 0  . omeprazole (PRILOSEC) 20 MG capsule Take 40 mg by mouth 2 (two) times daily before a meal.     . ONETOUCH DELICA LANCETS FINE MISC     . potassium citrate (UROCIT-K) 10 MEQ (1080 MG) SR tablet 3 (three) times daily.    . sitaGLIPtin (JANUVIA) 100 MG tablet Take 100 mg by mouth daily.    . traZODone (DESYREL) 150 MG tablet Take 150 mg by mouth at bedtime.    Marland Kitchen warfarin (COUMADIN) 5 MG tablet Take 12.5 mg (2 1/2 tabs) on 9/30, and 39m (2 tabs) on 10/1. Then further dosing as directed by coumadin clinic pharmacist 60 tablet 3   No facility-administered medications prior to visit.    PAST MEDICAL HISTORY: Past Medical History  Diagnosis Date  . Coronary artery disease     a. Low level exercise Lex MV 3/14: low risk, EF 62%, inf defect 2/2 diaph attenuation vs artifiact, small area of scar possible, no ischemia  . Hyperlipidemia   . Hypertension   . History of seizure disorder   . History of atrial flutter   . Depression   . Back pain     persistent  . Hearing loss   . Diverticulitis   . H/O alcohol abuse   . Diabetes mellitus     controled by diet  . GERD  (gastroesophageal reflux disease)   . Seizures   . Headache(784.0)   . Arthritis     of spine  . Sleep apnea     does not use CPAP  . Hx of echocardiogram     Echo 7/14:  Mod LVH, EF 55-60%, Gr 1 DD, mild MR, PASP 36  . AKI (acute kidney injury)     a. 6/14: resolved after d/c ARB;  b. RA U/S 7/14: no RA stenosis  . Atrial flutter   . Stroke   . Shortness of breath   . Renal stone 04/15/2014    PAST SURGICAL HISTORY: Past Surgical History  Procedure Laterality Date  . Partial colectomy      for diverticuli  . Orchiectomy    . Umbilical hernia repair    .  Cervical fusion    . Coronary artery bypass graft  2005    LIMA graft to LAD,saphenous vein graft to diag.,circumflex, marginal,and to the RCA  . Cardiac catheterization    . Appendectomy    . Esophagogastroduodenoscopy  02/10/2012    Procedure: ESOPHAGOGASTRODUODENOSCOPY (EGD);  Surgeon: Cleotis Nipper, MD;  Location: [facility] ENDOSCOPY;  Service: Endoscopy;  Laterality: N/A;  . Foreign body removal  02/10/2012    Procedure: FOREIGN BODY REMOVAL;  Surgeon: Cleotis Nipper, MD;  Location: [place];  Service: Endoscopy;  Laterality: N/A;  . Back surgery    . Esophagogastroduodenoscopy  06/29/2012    Procedure: ESOPHAGOGASTRODUODENOSCOPY (EGD);  Surgeon: Winfield Cunas., MD;  Location: [facility] ENDOSCOPY;  Service: Endoscopy;  Laterality: N/A;  . Foreign body removal  06/29/2012    Procedure: FOREIGN BODY REMOVAL;  Surgeon: Winfield Cunas., MD;  Location: [facility] ENDOSCOPY;  Service: Endoscopy;  Laterality: N/A;  . Savory dilation  06/29/2012    Procedure: SAVORY DILATION;  Surgeon: Winfield Cunas., MD;  Location: [facility] ENDOSCOPY;  Service: Endoscopy;  Laterality: N/A;  . Tee without cardioversion N/A 11/17/2013    Procedure: TRANSESOPHAGEAL ECHOCARDIOGRAM (TEE);  Surgeon: Larey Dresser, MD;  Location: [place];  Service: Cardiovascular;  Laterality: N/A;  . Cystoscopy/retrograde/ureteroscopy/stone extraction with basket Left  04/16/2014    Procedure: CYSTOSCOPY/RETROGRADE/LEFT URETEROSCOPY/STONE EXTRACTION WITH BASKET AND LASER LITHOTRIPSY;  Surgeon: Malka So, MD;  Location: [facility] ORS;  Service: Urology;  Laterality: Left;  With STENT  . Left heart catheterization with coronary angiogram N/A 04/02/2012    Procedure: LEFT HEART CATHETERIZATION WITH CORONARY ANGIOGRAM;  Surgeon: Sinclair Grooms, MD;  Location: [facility] CATH LAB;  Service: Cardiovascular;  Laterality: N/A;  . Loop recorder implant N/A 11/17/2013    Procedure: LOOP RECORDER IMPLANT;  Surgeon: Deboraha Sprang, MD;  Location: [facility] CATH LAB;  Service: Cardiovascular;  Laterality: N/A;    FAMILY HISTORY: Family History  Problem Relation Age of Onset  . Emphysema Mother     was a smoker  . Asthma Mother   . Heart disease Father   . Prostate cancer Paternal Grandfather   . Pancreatic cancer Paternal Uncle   . Heart attack Neg Hx   . Stroke Paternal Grandmother   . Hypertension Mother     PHYSICAL EXAM  Filed Vitals:   09/27/14 1016  BP: 103/64  Pulse: 73  Height: 5' 5"  (1.651 m)  Weight: 206 lb (93.441 kg)   Body mass index is 34.28 kg/(m^2).  Generalized: Well developed, in no acute distress   Neurological examination  Mentation: Alert oriented to time, place, history taking. Follows all commands speech and language fluent. MMSE  Cranial nerve II-XII: Pupils were equal round reactive to light. Extraocular movements were full, visual field were full on confrontational test. Facial sensation and strength were normal. Uvula tongue midline. Head turning and shoulder shrug  were normal and symmetric. Motor: The motor testing reveals 5 over 5 strength of all 4 extremities except 4/5 in the left lower extremity. Good symmetric motor tone is noted throughout.  Sensory: Sensory testing is intact to soft touch on all 4 extremities. No evidence of extinction is noted.  Coordination: Cerebellar testing reveals good finger-nose-finger and heel-to-shin  bilaterally.  Gait and station: Gait is normal, uses a rolator when ambulating. Tandem gait not attempted. Romberg is negative. No drift is seen.  Reflexes: Deep tendon reflexes are symmetric and normal bilaterally.    DIAGNOSTIC DATA (LABS, IMAGING,  TESTING) - I reviewed patient records, labs, notes, testing and imaging myself where available.  Lab Results  Component Value Date   WBC 7.9 05/30/2014   HGB 12.5* 05/30/2014   HCT 38.5* 05/30/2014   MCV 98.1* 05/30/2014   PLT 212 05/24/2014      Component Value Date/Time   NA 138 05/30/2014 1237   K 3.6 05/30/2014 1237   CL 102 05/30/2014 1237   CO2 20 05/30/2014 1237   GLUCOSE 99 05/30/2014 1237   BUN 17 05/30/2014 1237   CREATININE 0.94 05/30/2014 1237   CREATININE 0.86 05/23/2014 0530   CALCIUM 9.5 05/30/2014 1237   PROT 8.8* 11/14/2013 1808   ALBUMIN 4.4 11/14/2013 1808   AST 36 11/14/2013 1808   ALT 25 11/14/2013 1808   ALKPHOS 73 11/14/2013 1808   BILITOT 0.5 11/14/2013 1808   GFRNONAA 86* 05/23/2014 0530   GFRAA >90 05/23/2014 0530   Lab Results  Component Value Date   CHOL 209* 10/29/2013   HDL 36* 10/29/2013   LDLCALC NOT CALC 10/29/2013   TRIG 441* 10/29/2013   CHOLHDL 5.8 10/29/2013   Lab Results  Component Value Date   HGBA1C 7.0* 04/15/2014   No results found for: VITAMINB12 Lab Results  Component Value Date   TSH 2.03 05/18/2014      ASSESSMENT AND PLAN 69 y.o. year old male  has a past medical history of Coronary artery disease; Hyperlipidemia; Hypertension; History of seizure disorder; History of atrial flutter; Depression; Back pain; Hearing loss; Diverticulitis; H/O alcohol abuse; Diabetes mellitus; GERD (gastroesophageal reflux disease); Seizures; Headache(784.0); Arthritis; Sleep apnea; echocardiogram; AKI (acute kidney injury); Atrial flutter; Stroke; Shortness of breath; and Renal stone (04/15/2014). here with:  1. History of multiple strokes 2. OSA on BiPAP 3. Memory loss  Continue  Coumadin.  Maintain strict control of hypertension with blood pressure goal below 130/90, lipids with an LDL cholesterol goal below 100 mg percent. Keep Hemoglobin A1C < 6.5 % BIPAP download was excellent- f/u visit with Dr. Rexene Sharp next week.  Memory is stable- MMSE 28/30- will continue to monitor.  If symptoms worsen or you develop new symptoms please let us know.  Follow-up in 6 months or sooner if needed.   Ward Givens, MSN, NP-C 09/27/2014, 10:18 AM [facility] [address], [place], [place] [address] [phone]  Note: This document was prepared with digital dictation and possible smart phrase technology. Any transcriptional errors that result from this process are unintentional.  I reviewed the above note and documentation by the Nurse Practitioner and agree with the history, physical exam, assessment and plan as outlined above. I was immediately available for face-to-face consultation. Star Age, MD, PhD [facility])

## 2014-09-28 ENCOUNTER — Ambulatory Visit (INDEPENDENT_AMBULATORY_CARE_PROVIDER_SITE_OTHER): Payer: Medicare Other | Admitting: General Practice

## 2014-09-28 ENCOUNTER — Telehealth: Payer: Self-pay | Admitting: Internal Medicine

## 2014-09-28 ENCOUNTER — Ambulatory Visit: Payer: Medicare Other

## 2014-09-28 DIAGNOSIS — Z5181 Encounter for therapeutic drug level monitoring: Secondary | ICD-10-CM | POA: Diagnosis not present

## 2014-09-28 LAB — POCT INR: INR: 3.8

## 2014-09-28 MED ORDER — BISOPROLOL FUMARATE 5 MG PO TABS: 5.0000 mg | ORAL_TABLET | Freq: Two times a day (BID) | ORAL | Status: DC

## 2014-09-28 NOTE — Progress Notes (Signed)
Agree with plan 

## 2014-09-28 NOTE — Telephone Encounter (Signed)
Spoke with patient-aware that we do not have any samples at this time and that he is due for appt with MW. Pt has scheduled this and needs to find out from [place] what other BP med he can take as insurance will only let him have 15 tabs monthly and costing too much.  Will forward to MW to advise.

## 2014-09-28 NOTE — Telephone Encounter (Signed)
Ok to give samples  Generic equivalent is bisoprolol 5 mg twice daily

## 2014-09-28 NOTE — Telephone Encounter (Signed)
We have no samples of Bystolic or Bisoprolol Can send Rx for Bisoprolol 57m to pharmacy Spoke with patient wife, aware that Rx is being send to pharmacy. Nothing further needed.

## 2014-09-28 NOTE — Progress Notes (Signed)
Pre visit review using our clinic review tool, if applicable. No additional management support is needed unless otherwise documented below in the visit note. 

## 2014-09-29 NOTE — Progress Notes (Signed)
I agree with the above plan 

## 2014-09-30 ENCOUNTER — Encounter: Payer: Self-pay | Admitting: Neurology

## 2014-10-03 ENCOUNTER — Ambulatory Visit (INDEPENDENT_AMBULATORY_CARE_PROVIDER_SITE_OTHER): Payer: Medicare Other | Admitting: Internal Medicine

## 2014-10-03 ENCOUNTER — Encounter: Payer: Self-pay | Admitting: Internal Medicine

## 2014-10-03 VITALS — BP 112/70 | HR 70 | Ht 68.0 in | Wt 207.0 lb

## 2014-10-03 DIAGNOSIS — I6340 Cerebral infarction due to embolism of unspecified cerebral artery: Secondary | ICD-10-CM

## 2014-10-03 DIAGNOSIS — I2699 Other pulmonary embolism without acute cor pulmonale: Secondary | ICD-10-CM

## 2014-10-03 DIAGNOSIS — J9610 Chronic respiratory failure, unspecified whether with hypoxia or hypercapnia: Secondary | ICD-10-CM

## 2014-10-03 DIAGNOSIS — I10 Essential (primary) hypertension: Secondary | ICD-10-CM | POA: Diagnosis not present

## 2014-10-03 NOTE — Assessment & Plan Note (Signed)
Placed on 02 at d/c 03/2014  - 10/03/2014   Walked 2lpm x 10 ft desat but on 3lpm pulsed completed 2 laps at 185 ft each with adequate sats  rec as of 10/03/2014 2lpm 24/7 but 3lpm pulsed with ex

## 2014-10-03 NOTE — Progress Notes (Signed)
Patient ID: [name], male    DOB: 1945/10/05  MRN: [id number]    Brief patient profile:  17 yowm never smoker with obesity and osa/appliance dep with h/o ? Asthma as child and then needed inhalers with colds only  since 2004 referred by Dr Cleta Alberts for eval of sob x 2013 after eval by Susa Simmonds for hiccups in 2014 dx'd with ? Asbestosis.  History of Present Illness  02/11/2013 1st pulmonary eval cc indolent onset progressive doe esp out in hot weather x one year with some dry cough on hot air exposure and even indoors has to stop about 150 yards nl pace with stress test by [facility] March 2014 with nl ef, low risk IHD.  Has used inhalers in past, but not in past year  Has noted some assoc with  swelling but no orthopnea.  rec GERD diet Continue prilosec 2 before bfast and 2 before supper. Stop losartan and lopressor(metaprolol) Start bystolic 10 mg twice daily for now    05/18/2014 extended "re-establish" ov/[name] re: unexplained hypoxemia/ fits of sob with loss of voice  Chief Complaint  Patient presents with  . Follow-up    Pt c/o increased SOB for the past several months. He was started on supplemental o2 in Aug 2015 during hospital admission.  He states that he gets SOB with walking any distance or even just at rest.   onset of hiccups x one year followed by gradually worsening  cough fits mostly daytime assoc with brief inability to speak lasting sev min then resolves with sob just as likely at rest as with ex Brings up Ige mucus esp at hs on a bipap machine which seems to help sleep, no worse in am Has not tried inhalers  On max acid suppression per Dr Randa Evens but not diet  Sees [facility] docs also, wife now present saying our med sheet is correct but his reporting of previous symptoms is not (he says he was better last ov, she says not). Not clear why hasn't returned in 8 m but apparently dealing with stroke sequelae No swallowing eval in emr   On bipap per neuro "helps a lot" per pt but wife  says still waking freq rec CTa > pos PE/ multiple    Admit date: 05/19/2014  Discharge date: 05/25/2014  Discharge Diagnoses:  Pulmonary embolism  Upper airway Cough Syndrome  Pleural Plaque  Acute hypoxic respiratory failure  HTN  Known CAD  GERD  Diabetes  Insomnia  Detailed Hospital Course:  This is a pleasant 69 y/o male who saw Dr. Sherene Sires day prior to ov for shortness of breath. He notes ongoing dyspnea and cough with Berenguer mucus production for several years, but in the last month he has had increased dyspnea even at rest. Notably, he was hospitalized for 3 days one month prior to admit for a kidney stone. He was discharged home on oxygen with a presumptive diagnosis of asbestosis. He went to Dr. Sherene Sires on 9/23 for the dyspnea. A CT angiogram was ordered and he was found to have a pulmonary embolism.  He was admitted to the hospital intensive care. ECHO was obtained: ECHO showed EF 55-60%, hypokinesis of the apicalanteroseptal myocardium, grade I diastolic dysfunction, but no evidence of right heart strain. He was treated supportively on Oxygen, IV heparin and coumadin overlap. NOAC therapy wa discussed but the pt decided on Coumadin. As of time of discharge he is now on day #7  Heparin but  His INR remains subtherapeutic but  he desires to go home and we feel he can safely do this on a LMWH overlap with his coumadin. Prior to discharge he was ambulated to assess Oxygen requirements.  Per nursing notes walked hallway s distress and s 02  Discharge Plan by active problems  Possibly Provoked pulmonary embolism, non-massive as hemodynamically stable  (discussed NOAC vs warfarin, he prefers warfarin)  Upper airway cough syndrome  Hx of questioned Asbestos related pleural disease> no evidence of asbestosis  Acute hypoxemic respiratory failure > due to PE  PLAN  -now on day #7 overlap. INR remains sub therapeutic. Will send him home on LMWH and coumadin. Will send him home on 12.5 mg today/10mg   tomorrow and ck INR. Will go on LMWH at 90mg  q12.  -f/u [place] [facility] 10/2 at 10am  -plan three months warfarin (given this was a presumed provoked PE)  -titrate O2 for SpO2 > 92%, hopefully can d/c in future (started recently for suspected asbestosis, likely PE though)  OSA (felt central) f/b [name]  Plan  Resume BIPAP per home settings: 16/8 cm with a rate of 10 per minute.  Hypertension  Known CAD  PLAN  -continue preadmission b-blocker, statin  GERD  PLAN  - PPI  DM2  PLAN  -continue home meds  Insomnia  PLAN  - Continue home meds  Anemia  PLAN  -trend CBC  Significant Hospital tests/ studies  Consults: pharmacy  ECHO 9/24: EF 55-60%, hypokinesis of the apicalanteroseptal myocardium, grade I diastolic dysfunction, but no evidence of right heart strain.    Lab Results   Component  Value  Date    INR  1.76*  05/25/2014    INR  1.49  05/24/2014    INR  1.30  05/23/2014     05/30/14 extended post hosp ov/ transition of care / [name]  On coumadin 5 mg daily per coumadin clinic per pt Chief Complaint  Patient presents with  . HFU    Pt states that there has been no improvement in his breathing since hospital d/c.    very confused with all aspects of care, daughter was helping now flying back to [place] "you should ask her" Wife has pages of redundant and conflicting sheets because she keeps old avs's and refers back to them for his current care.  Cc doe x across the room even on 02 whereas hosp notes say walking entire hallway off 02 s difficulty albeit at slow pace rec I will ask the coumadin clinic to contact you  re timing for follow up and make sure you are taking the right dose Continue 2lpm 24/7 See Dr Randa Evens re your abnormal swallowing study to see if anything can be done or you need to be referred to a medical center.     06/13/2014 f/u ov/[name] re: s/p PE/ ? Component vcd from es dysfunction / has not seen Dr Dorothea Glassman yet  Chief Complaint  Patient  presents with  . Follow-up    PFT done today. Breathing is unchanged. No new co's today.   did [employer] on 02 2lpm poc at slow pace  And doing PT twice daily x 1 hour through the [place]  Does fine on bipap per neurology but burps worse when places it on. rec Let your bipap doctor know that your belching is worse when you put the bipap on at night Discuss with Dr Randa Evens your abnormal swallowing study from [facility] 05/19/14 to decide what if anything can be done and whether you need  to seek care at a medical center   10/03/2014 f/u ov/[name] re: 02 dep resp failure/ obesity/deconditioning/ sp pe 04/2014 on no pulmonary meds  Chief Complaint  Patient presents with  . Follow-up    Pt states that his breathing is overall doing well. No new co's today.   starting rehab as of day of ov/ able to [employer] but resting x 3 to complete the shopping - not monitoring sats until after sits down on 2lpm continuous  Followed in coumadin clinic but not sure re instructions for refills    No obvious day to day or daytime variabilty or excess or purulent mucus production or   cp or chest tightness, subjective wheeze overt sinus or hb symptoms. No unusual exp hx or h/o childhood pna/ asthma or knowledge of premature birth.  Sleeping poorly on bipap? Makes belching worse.  Also denies any obvious fluctuation of symptoms with weather or environmental changes or other aggravating or alleviating factors except as outlined above   Current Medications, Allergies, Complete Past Medical History, Past Surgical History, Family History, and Social History were reviewed in [employer] record.  ROS  The following are not active complaints unless bolded sore throat, dysphagia, dental problems, itching, sneezing,  nasal congestion or excess/ purulent secretions, ear ache,   fever, chills, sweats, unintended wt loss, pleuritic or exertional cp, hemoptysis,  orthopnea pnd or leg swelling better , presyncope,  palpitations, heartburn, abdominal pain, anorexia, nausea, vomiting, diarrhea  or change in bowel or urinary habits, change in stools or urine, dysuria,hematuria,  rash, arthralgias, visual complaints, headache, numbness weakness or ataxia or problems with walking or coordination,  change in mood/affect or memory.              Objective:   Physical Exam  amb obese wm nad less hopeless affect/ attitute    08/10/2013     213 > 09/08/2013 214 > 05/18/2014  208 > 05/30/14  219 > 06/13/2014  210 > 10/03/2014  207  Wt Readings from Last 3 Encounters:  06/09/13 218 lb (98.884 kg)  05/04/13 223 lb 9.6 oz (101.424 kg)  04/28/13 222 lb 12.8 oz (101.061 kg)        HEENT: nl dentition, turbinates, and orophanx. Nl external ear canals without cough reflex   NECK :  without JVD/Nodes/TM/ nl carotid upstrokes bilaterally   LUNGS: no acc muscle use, clear to A and P bilaterally    CV:  RRR  no s3 or murmur or increase in P2,  No sign ankle edema   ABD:  Obese soft and nontender with nl excursion in the supine position. No bruits or organomegaly, bowel sounds nl  MS:  warm without deformities, calf tenderness, cyanosis or clubbing  SKIN: warm and dry without lesions         CXR:  05/30/14  I personally reviewed images and agree with radiology impression as follows:   1. No acute findings. 2. Asbestos related pleural disease    Recent Labs Lab 05/18/14 1332  NA 138  K 4.2  CL 103  CO2 30  BUN 19  CREATININE 1.0  GLUCOSE 114*    Recent Labs Lab 05/18/14 1332  HGB 12.1*  HCT 36.5*  WBC 7.4  PLT 209.0     Lab Results  Component Value Date   TSH 2.03 05/18/2014     Lab Results  Component Value Date   PROBNP 69.0 05/18/2014    Lab Results  Component Value Date  DDIMER 2.02* 05/18/2014   CTa 05/19/14 Pulmonary embolus involving right upper lobe, right middle lobe and  bilateral lower lobes. The ventricular ratio suggest right heart  strain. There is no evidence of  pulmonary infarct.       Assessment & Plan:

## 2014-10-03 NOTE — Assessment & Plan Note (Signed)
Has issues related to cost of BB > prefer bystolic > bisoprolol > lopressor but would avoid other less specific BB esp in high doses > defer specific choice depending on formulary > f/u primary care

## 2014-10-03 NOTE — Assessment & Plan Note (Addendum)
-   See CTa 05/19/14 and admit > coumadin rx for now per coumadin clinic at [facility] - Echo 05/20/14 Hypokinesis of the distal septal wall with overall normal LV function; grade 1 diastolic dysfunction; normal RV function; trace MR - Venous dopplers 06/08/2014 > neg bilaterally   I had an extended discussion with the patient and wife reviewing all relevant studies completed to date and  lasting 15 to 20 minutes of a 25 minute visit on the following ongoing concerns:   His obesity is his main risk factor for recurrence and only likely to be addressed by consistently neg cal bal   See chronic resp failure re need to keep sats higher with ex   See instructions for specific recommendations which were reviewed directly with the patient who was given a copy with highlighter outlining the key components.

## 2014-10-03 NOTE — Patient Instructions (Addendum)
See [name] if not happy with blood pressure medication cost but take formulatory with you  but my preference is  bystolic> bisoprolol > metaprolol to see if it makes any difference.   Remember goal is to be short of breath, never out of breath, with exercise at physical therapy  Please schedule a follow up visit in 3 months but call sooner if needed  rec 2lpm 24/7 but 3lpm pulsed when walking

## 2014-10-04 ENCOUNTER — Telehealth: Payer: Self-pay | Admitting: Internal Medicine

## 2014-10-04 ENCOUNTER — Other Ambulatory Visit: Payer: Self-pay | Admitting: General Practice

## 2014-10-04 MED ORDER — WARFARIN SODIUM 5 MG PO TABS: ORAL_TABLET | ORAL | Status: DC

## 2014-10-04 NOTE — Telephone Encounter (Signed)
Patient needs coum refill.  Patient uses Financial planner.

## 2014-10-06 ENCOUNTER — Encounter: Payer: Medicare Other | Admitting: Neurology

## 2014-10-06 ENCOUNTER — Ambulatory Visit (INDEPENDENT_AMBULATORY_CARE_PROVIDER_SITE_OTHER): Payer: Medicare Other | Admitting: Neurology

## 2014-10-06 ENCOUNTER — Encounter: Payer: Self-pay | Admitting: Neurology

## 2014-10-06 VITALS — BP 116/75 | HR 77 | Temp 97.2°F | Ht 66.0 in | Wt 212.0 lb

## 2014-10-06 DIAGNOSIS — I6340 Cerebral infarction due to embolism of unspecified cerebral artery: Secondary | ICD-10-CM

## 2014-10-06 DIAGNOSIS — G4731 Primary central sleep apnea: Secondary | ICD-10-CM | POA: Diagnosis not present

## 2014-10-06 DIAGNOSIS — I6930 Unspecified sequelae of cerebral infarction: Secondary | ICD-10-CM

## 2014-10-06 DIAGNOSIS — G4733 Obstructive sleep apnea (adult) (pediatric): Secondary | ICD-10-CM

## 2014-10-06 LAB — MDC_IDC_ENUM_SESS_TYPE_REMOTE

## 2014-10-06 NOTE — Progress Notes (Signed)
Subjective:    Patient ID: [name] is a 69 y.o. male.  HPI     Interim history:   [name] is a very pleasant 70 year old left-handed gentleman with an underlying complex medical history of heart disease, hyperlipidemia, obesity, hypertension, history of seizure disorder, history of atrial flutter, depression, chronic back pain, hearing loss, diverticulitis, history of alcohol and prescription medicine abuse, diabetes, reflux disease, recurrent headaches, and stroke x 3 (in the 40J and embolic stroke in 03/1190), who presents for followup consultation of his obstructive sleep apnea. He is accompanied by his wife again today. I last saw him on 04/05/2014, at which time he reported doing well with his breathing and tolerating BiPAP therapy. He had had a CT angiogram head and neck and was encouraged to discuss the findings with Dr. Leonie Man. He was compliant with BiPAP therapy and felt better. I congratulated him on his excellent compliance.  Today, I reviewed his compliance data from 09/03/2014 through 10/02/2014 which is a total of 30 days during which time he used his machine every night with percent used days greater than 4 hours of 100%, indicating superb compliance. He is on BiPAP ST at 16/8 with a rate of 10. Residual AHI 0.4 per hour, leak acceptable with the 95th percentile at 22.4 L/m.  Today, he reports not sleeping through the night. He has to get up to use the bathroom d/t diarrhea about 4 times per night. He saw Dr. [name] on 10/03/14, and was told to use O2 24/7 at 2 lpm and 3 lpm with walking. He has not yet been using his oxygen at night along with his BiPAP. Unfortunately his sleep is disrupted because of his frequent bathroom breaks. Since his colon surgery he has to get up multiple times. He does not necessarily have nocturia. He does not drink enough water. He is on Lasix twice a day. His wife reports that they did receive an adapter in the mail to look up his oxygen concentrator tube  to his BiPAP but they have not actually started doing that.   I first met him on 12/22/2013 at the request of my colleague Dr. Leonie Man, was him for his stroke, at which time the patient reported a prior diagnosis of obstructive sleep apnea over 5 years ago. He tried CPAP, but was not able to tolerate it, as he kept pulling the mask off. He then tried a dental appliance for about 2 years, which may have helped some and he did have several home sleep tests, per his dentist, but eventually, he stopped using the appliance, and it has been lost or chewed up by the dog. He reported snoring, daytime somnolence, and restless sleep. I suggested reevaluation for obstructive sleep apnea with a sleep study. He had a split-night sleep study on 01/24/2014. Baseline sleep efficiency was reduced at 44% with a long latency to sleep of 73.5 minutes and wake after sleep onset of 15.5 minutes with moderate sleep fragmentation noted. He had absence of slow-wave sleep and a mildly reduced amount of REM sleep. REM latency was mildly reduced at 67.5 minutes. He had no significant periodic leg movements of sleep. His total AHI was highly elevated at 126.9 per hour, baseline oxygen saturation was 91%, nadir was 79% in REM sleep. He was titrated on CPAP. Sleep efficiency was much improved and arousal index was much improved. He had a markedly increased amount of REM sleep at 44%. He also achieved deep sleep. He exhibited central respiratory events while  on CPAP therapy with more than 50% of the respiratory events being central in nature. He was first tried on CPAP from 5-12 cm as well as different EPR settings. His AHI was not reduced significantly and he had persistent lower oxygen saturations in the low 90s to high 80s. He was therefore switched to BiPAP standard mode and titrated from 12/8 cm to 14/8 cm but had residual central respiratory events was therefore placed on BiPAP ST. He was titrated from 12/7 cm to 14/7 cm and was also briefly  tried on ASV. He was switched back to BiPAP ST. Final pressure setting was 16/8 cm with a rate of 10 per minute. I reviewed his compliance data from 02/15/2014 to 03/16/2014 which is a total of 30 days during which time he used his BiPAP every night with percent use more than 4 hours at 100%, indicating superb compliance. Average usage was 10 hours and 14 minutes, sitting as mentioned 16/8 cm with a rate of 10 with a residual AHI of only 6 per hour and very acceptable leak at 19.1 for the 95th percentile.  I reviewed his compliance data from 03/06/2014 through 04/04/2014 which is a total of 30 days, during which time he uses BiPAP every night. Percent used days greater than 4 hours was 96%, indicating excellent compliance, residual AHI 5.5 per hour, very acceptable, leak acceptable at 22.2 for the 95th percentile. Settings unchanged.  His Past Medical History Is Significant For: Past Medical History  Diagnosis Date  . Coronary artery disease     a. Low level exercise Lex MV 3/14: low risk, EF 62%, inf defect 2/2 diaph attenuation vs artifiact, small area of scar possible, no ischemia  . Hyperlipidemia   . Hypertension   . History of seizure disorder   . History of atrial flutter   . Depression   . Back pain     persistent  . Hearing loss   . Diverticulitis   . H/O alcohol abuse   . Diabetes mellitus     controled by diet  . GERD (gastroesophageal reflux disease)   . Seizures   . Headache(784.0)   . Arthritis     of spine  . Sleep apnea     does not use CPAP  . Hx of echocardiogram     Echo 7/14:  Mod LVH, EF 55-60%, Gr 1 DD, mild MR, PASP 36  . AKI (acute kidney injury)     a. 6/14: resolved after d/c ARB;  b. RA U/S 7/14: no RA stenosis  . Atrial flutter   . Stroke   . Shortness of breath   . Renal stone 04/15/2014    His Past Surgical History Is Significant For: Past Surgical History  Procedure Laterality Date  . Partial colectomy      for diverticuli  . Orchiectomy    .  Umbilical hernia repair    . Cervical fusion    . Coronary artery bypass graft  2005    LIMA graft to LAD,saphenous vein graft to diag.,circumflex, marginal,and to the RCA  . Cardiac catheterization    . Appendectomy    . Esophagogastroduodenoscopy  02/10/2012    Procedure: ESOPHAGOGASTRODUODENOSCOPY (EGD);  Surgeon: Cleotis Nipper, MD;  Location: [facility] ENDOSCOPY;  Service: Endoscopy;  Laterality: N/A;  . Foreign body removal  02/10/2012    Procedure: FOREIGN BODY REMOVAL;  Surgeon: Cleotis Nipper, MD;  Location: [place];  Service: Endoscopy;  Laterality: N/A;  . Back surgery    .  Esophagogastroduodenoscopy  06/29/2012    Procedure: ESOPHAGOGASTRODUODENOSCOPY (EGD);  Surgeon: Winfield Cunas., MD;  Location: [facility] ENDOSCOPY;  Service: Endoscopy;  Laterality: N/A;  . Foreign body removal  06/29/2012    Procedure: FOREIGN BODY REMOVAL;  Surgeon: Winfield Cunas., MD;  Location: [facility] ENDOSCOPY;  Service: Endoscopy;  Laterality: N/A;  . Savory dilation  06/29/2012    Procedure: SAVORY DILATION;  Surgeon: Winfield Cunas., MD;  Location: [facility] ENDOSCOPY;  Service: Endoscopy;  Laterality: N/A;  . Tee without cardioversion N/A 11/17/2013    Procedure: TRANSESOPHAGEAL ECHOCARDIOGRAM (TEE);  Surgeon: Larey [facility]er, MD;  Location: [place];  Service: Cardiovascular;  Laterality: N/A;  . Cystoscopy/retrograde/ureteroscopy/stone extraction with basket Left 04/16/2014    Procedure: CYSTOSCOPY/RETROGRADE/LEFT URETEROSCOPY/STONE EXTRACTION WITH BASKET AND LASER LITHOTRIPSY;  Surgeon: Malka So, MD;  Location: [facility] ORS;  Service: Urology;  Laterality: Left;  With STENT  . Left heart catheterization with coronary angiogram N/A 04/02/2012    Procedure: LEFT HEART CATHETERIZATION WITH CORONARY ANGIOGRAM;  Surgeon: Sinclair Grooms, MD;  Location: [facility] CATH LAB;  Service: Cardiovascular;  Laterality: N/A;  . Loop recorder implant N/A 11/17/2013    Procedure: LOOP RECORDER IMPLANT;  Surgeon: Deboraha Sprang,  MD;  Location: [facility] CATH LAB;  Service: Cardiovascular;  Laterality: N/A;    His Family History Is Significant For: Family History  Problem Relation Age of Onset  . Emphysema Mother     was a smoker  . Asthma Mother   . Heart disease Father   . Prostate cancer Paternal Grandfather   . Pancreatic cancer Paternal Uncle   . Heart attack Neg Hx   . Stroke Paternal Grandmother   . Hypertension Mother     His Social History Is Significant For: History   Social History  . Marital Status: Married    Spouse Name: Clinical research associate  . Number of Children: 2  . Years of Education: college   Occupational History  . 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     Social History Main Topics  . Smoking status: Never Smoker   . Smokeless tobacco: Never Used  . Alcohol Use: No     Comment: rare beer  . Drug Use: No  . Sexual Activity: Not Currently   Other Topics Concern  . None   Social History Narrative   Patient lives at home with  wife      Patient drinks 2 cups of coffee a day.patient is left handed    His Allergies Are:  Allergies  Allergen Reactions  . Cephalexin     Unknown  . Doxycycline     Unknown  . Keflex [Cephalexin]   . Lac Bovis Other (See Comments)    lactose intolerant  . Pentazocine Lactate     He passed out- he had a seizure.  This occurred around 2000  . Talwin [Pentazocine]   :   His Current Medications Are:  Outpatient Encounter Prescriptions as of 10/06/2014  Medication Sig  . atorvastatin (LIPITOR) 80 MG tablet Take 1 tablet (80 mg total) by mouth daily.  . bisoprolol (ZEBETA) 5 MG tablet Take 1 tablet (5 mg total) by mouth 2 (two) times daily.  Marland Kitchen BYSTOLIC 10 MG tablet TAKE 1 TABLET BY MOUTH TWICE DAILY  . FLUoxetine (PROZAC) 10 MG capsule Take 20 mg by mouth daily.  . furosemide (LASIX) 20 MG tablet TAKE 1 TO 2 TABLETS BY MOUTH DAILY AS NEEDED  . glucose  blood (ONE TOUCH ULTRA TEST) test strip   . methocarbamol (ROBAXIN) 750 MG tablet Take 750 mg by  mouth every 8 (eight) hours as needed for muscle spasms.   . metroNIDAZOLE (FLAGYL) 500 MG tablet Take 500 mg by mouth daily.  . nitroGLYCERIN (NITROSTAT) 0.4 MG SL tablet Place 1 tablet (0.4 mg total) under the tongue every 5 (five) minutes as needed. For chest pain  . ONETOUCH DELICA LANCETS FINE MISC   . potassium citrate (UROCIT-K) 10 MEQ (1080 MG) SR tablet Take 10 mEq by mouth 2 (two) times daily.   . sitaGLIPtin (JANUVIA) 100 MG tablet Take 100 mg by mouth daily.  . traZODone (DESYREL) 150 MG tablet Take 150 mg by mouth at bedtime.  Marland Kitchen warfarin (COUMADIN) 5 MG tablet Take as directed by anticoagulation clinic  . [DISCONTINUED] omeprazole (PRILOSEC) 20 MG capsule Take 40 mg by mouth 2 (two) times daily before a meal.   :  Review of Systems:  Out of a complete 14 point review of systems, all are reviewed and negative with the exception of these symptoms as listed below:   Review of Systems  Neurological:       Still not sleeping good    Objective:  Neurologic Exam  Physical Exam Physical Examination:   Filed Vitals:   10/06/14 1359  BP: 116/75  Pulse: 77  Temp: 97.2 F (36.2 C)    General Examination: The patient is a very pleasant 69 y.o. male in no acute distress. He appears well-developed and well-nourished and adequately groomed. He is situated in a chair and on O2 via [place].    HEENT: Normocephalic, atraumatic, pupils are equal, round and reactive to light and accommodation. Extraocular tracking is good without limitation to gaze excursion or nystagmus noted. Normal smooth pursuit is noted. Hearing is grossly intact. Face is symmetric with normal facial animation and normal facial sensation. Speech shows mild dysarthria. There is no hypophonia. There is no lip, neck/head, jaw or voice tremor. Neck is supple with full range of passive and active motion. There are no carotid bruits on auscultation. Oropharynx exam reveals: moderate mouth dryness, adequate dental hygiene and  marked airway crowding, due to quite narrow airway entry, and swollen and erythematous soft palate including uvula which is enlarged. Mallampati is class II. Tongue protrudes centrally and palate elevates symmetrically. Tonsils are absent. Neck size is enlarged.    Chest: Clear to auscultation without wheezing, rhonchi or crackles noted.  Heart: S1+S2+0, regular and normal without murmurs, rubs or gallops noted. He has a loop recorder in place.   Abdomen: Soft, non-tender and non-distended with normal bowel sounds appreciated on auscultation.  Extremities: There is no pitting edema in the distal lower extremities bilaterally.   Skin: Warm and dry without trophic changes noted. There are no varicose veins.  Musculoskeletal: exam reveals no obvious joint deformities, tenderness or joint swelling or erythema.   Neurologically:  Mental status: The patient is awake, alert and oriented in all 4 spheres. His immediate and remote memory, attention, language skills and fund of knowledge are fairly appropriate. There is no evidence of aphasia, agnosia, apraxia or anomia. Speech is clear with normal prosody and enunciation. Thought process is linear but he does have some slowness in thinking and tends to turn to his wife for some of the answers. Mood is constricted and affect is blunted.  Cranial nerves II - XII are as described above under HEENT exam. In addition: shoulder shrug is normal with equal shoulder height  noted. Motor exam: Normal bulk, strength and tone is noted on the R and he has unchanged 4+/5 degree of weakness in his left lower extremity with hip flexion and knee extension primarily. Reflexes are about 1+ throughout. Fine motor skills are mildly impaired on the left. He does not have any overt dysmetria or intention tremor on cerebellar testing. Sensory exam is intact to light touch, pinprick, vibration and temperature sense in the upper extremities but decrease to a mild degree to pinprick  and vibration sense in the distal lower extremities bilaterally. Gait, station and balance: He stands up slowly and has a slight limp on the right side. He does walk well with his rolling walker.   Assessment and Plan:  In summary, [name] is a very pleasant 69 year old male with an underlying complex medical history of heart disease, hyperlipidemia, obesity, hypertension, history of seizure disorder, history of atrial flutter, depression, chronic back pain, hearing loss, diverticulitis, history of alcohol and prescription medicine abuse, diabetes, reflux disease, recurrent headaches, and stroke x 3, first in the 90s, including a recent embolic stroke with residual right lower extremity weakness, and chronic respiratory failure requiring oxygen 24-7, who presents for followup consultation of his OSA, on BiPAP therapy. He has been fully compliant with treatment and is encouraged to continue with treatment and greatly commended for  trying to be compliant. He has overall felt better since starting BiPAP therapy. He has trouble maintaining sleep and unfortunately it is primarily because he has to get up to use the bathroom so many times. He is encouraged to start using his oxygen as advised by Dr. [name] at night as well. He is encouraged to stay better hydrated.  I answered all their questions today and I will see him back in 6 months. They were in agreement.

## 2014-10-06 NOTE — Patient Instructions (Signed)
Please continue using your BiPAP regularly. While your insurance requires that you use PAP at least 4 hours each night on 70% of the nights, I recommend, that you not skip any nights and use it throughout the night if you can. Getting used to biPAP and staying with the treatment long term does take time and patience and discipline. Untreated obstructive sleep apnea when it is moderate to severe can have an adverse impact on cardiovascular health and raise her risk for heart disease, arrhythmias, hypertension, congestive heart failure, stroke and diabetes. Untreated obstructive sleep apnea causes sleep disruption, nonrestorative sleep, and sleep deprivation. This can have an impact on your day to day functioning and cause daytime sleepiness and impairment of cognitive function, memory loss, mood disturbance, and problems focussing. Using PAP regularly can improve these symptoms.

## 2014-10-07 ENCOUNTER — Telehealth: Payer: Self-pay | Admitting: Neurology

## 2014-10-07 ENCOUNTER — Telehealth: Payer: Self-pay | Admitting: Internal Medicine

## 2014-10-07 DIAGNOSIS — Z888 Allergy status to other drugs, medicaments and biological substances status: Secondary | ICD-10-CM | POA: Diagnosis not present

## 2014-10-07 DIAGNOSIS — M545 Low back pain: Secondary | ICD-10-CM | POA: Diagnosis not present

## 2014-10-07 DIAGNOSIS — Z881 Allergy status to other antibiotic agents status: Secondary | ICD-10-CM | POA: Diagnosis not present

## 2014-10-07 DIAGNOSIS — E119 Type 2 diabetes mellitus without complications: Secondary | ICD-10-CM | POA: Diagnosis not present

## 2014-10-07 DIAGNOSIS — G894 Chronic pain syndrome: Secondary | ICD-10-CM | POA: Diagnosis not present

## 2014-10-07 DIAGNOSIS — Z981 Arthrodesis status: Secondary | ICD-10-CM | POA: Diagnosis not present

## 2014-10-07 DIAGNOSIS — Z79899 Other long term (current) drug therapy: Secondary | ICD-10-CM | POA: Diagnosis not present

## 2014-10-07 MED ORDER — WARFARIN SODIUM 5 MG PO TABS: ORAL_TABLET | ORAL | Status: DC

## 2014-10-07 NOTE — Telephone Encounter (Signed)
Dr readling called.  Pt lost his rx for coumadin. Needs it sent back to [facility] [address]. He will need this by the end of the day.  No need to call dr readling back

## 2014-10-07 NOTE — Telephone Encounter (Signed)
I have attempted without success to contact this patient by phone to his home phone. I left the patient a message to call me back.

## 2014-10-07 NOTE — Telephone Encounter (Signed)
I spoke to the patient's partner about scheduling Visit 5 for the Respect-ESUS trial. The appointment was scheduled for [id number] at 16:00h with Dr. Erlinda Hong.

## 2014-10-07 NOTE — Telephone Encounter (Signed)
rx resent.  Nothing further needed.

## 2014-10-17 ENCOUNTER — Ambulatory Visit (INDEPENDENT_AMBULATORY_CARE_PROVIDER_SITE_OTHER): Payer: Medicare Other | Admitting: *Deleted

## 2014-10-17 ENCOUNTER — Encounter (INDEPENDENT_AMBULATORY_CARE_PROVIDER_SITE_OTHER): Payer: Self-pay | Admitting: Neurology

## 2014-10-17 DIAGNOSIS — Z0289 Encounter for other administrative examinations: Secondary | ICD-10-CM

## 2014-10-17 DIAGNOSIS — I6340 Cerebral infarction due to embolism of unspecified cerebral artery: Secondary | ICD-10-CM | POA: Diagnosis not present

## 2014-10-17 DIAGNOSIS — I639 Cerebral infarction, unspecified: Secondary | ICD-10-CM

## 2014-10-18 NOTE — Progress Notes (Signed)
Loop recorder 

## 2014-10-19 ENCOUNTER — Ambulatory Visit (INDEPENDENT_AMBULATORY_CARE_PROVIDER_SITE_OTHER): Payer: Medicare Other | Admitting: General Practice

## 2014-10-19 DIAGNOSIS — Z5181 Encounter for therapeutic drug level monitoring: Secondary | ICD-10-CM | POA: Diagnosis not present

## 2014-10-19 LAB — POCT INR: INR: 1.8

## 2014-10-19 NOTE — Progress Notes (Signed)
Agree with plan 

## 2014-10-19 NOTE — Progress Notes (Signed)
Pre visit review using our clinic review tool, if applicable. No additional management support is needed unless otherwise documented below in the visit note. 

## 2014-10-22 ENCOUNTER — Other Ambulatory Visit: Payer: Self-pay | Admitting: Cardiology

## 2014-11-02 LAB — MDC_IDC_ENUM_SESS_TYPE_REMOTE

## 2014-11-08 ENCOUNTER — Encounter: Payer: Self-pay | Admitting: Internal Medicine

## 2014-11-08 ENCOUNTER — Ambulatory Visit (INDEPENDENT_AMBULATORY_CARE_PROVIDER_SITE_OTHER): Payer: Medicare Other | Admitting: Internal Medicine

## 2014-11-08 VITALS — BP 104/64 | HR 73 | Ht 68.0 in | Wt 211.0 lb

## 2014-11-08 DIAGNOSIS — I6340 Cerebral infarction due to embolism of unspecified cerebral artery: Secondary | ICD-10-CM | POA: Diagnosis not present

## 2014-11-08 DIAGNOSIS — I10 Essential (primary) hypertension: Secondary | ICD-10-CM

## 2014-11-08 DIAGNOSIS — I639 Cerebral infarction, unspecified: Secondary | ICD-10-CM | POA: Diagnosis not present

## 2014-11-08 DIAGNOSIS — Z4509 Encounter for adjustment and management of other cardiac device: Secondary | ICD-10-CM

## 2014-11-08 LAB — MDC_IDC_ENUM_SESS_TYPE_INCLINIC

## 2014-11-08 NOTE — Progress Notes (Signed)
Patient Care Team: Darlyne Russian, MD as PCP - General (Family Medicine) Domingo Pulse, MD (Urology) Jenne Campus, MD as Referring Physician (Neurosurgery)   HPI  [name] is a 69 y.o. male Seen in follow-up for loop recorder implanted 3/15 for cryptogenic stroke.  He has a history of ischemic heart disease with prior bypass surgery in 2005. Echocardiogram 9/15 demonstrated normal LV function;   this was undertaken when he had presented with a history of dyspnea and was found on angiography to have pulmonary embolism.  He is now on warfarin   He has been part of the dabigitran asa randomized trial and was felt ( ?INR) to have been on dabigitran   He is now on coumadin  Past Medical History  Diagnosis Date  . Coronary artery disease     a. Low level exercise Lex MV 3/14: low risk, EF 62%, inf defect 2/2 diaph attenuation vs artifiact, small area of scar possible, no ischemia  . Hyperlipidemia   . Hypertension   . History of seizure disorder   . History of atrial flutter   . Depression   . Back pain     persistent  . Hearing loss   . Diverticulitis   . H/O alcohol abuse   . Diabetes mellitus     controled by diet  . GERD (gastroesophageal reflux disease)   . Seizures   . Headache(784.0)   . Arthritis     of spine  . Sleep apnea     does not use CPAP  . Hx of echocardiogram     Echo 7/14:  Mod LVH, EF 55-60%, Gr 1 DD, mild MR, PASP 36  . AKI (acute kidney injury)     a. 6/14: resolved after d/c ARB;  b. RA U/S 7/14: no RA stenosis  . Atrial flutter   . Stroke   . Shortness of breath   . Renal stone 04/15/2014    Past Surgical History  Procedure Laterality Date  . Partial colectomy      for diverticuli  . Orchiectomy    . Umbilical hernia repair    . Cervical fusion    . Coronary artery bypass graft  2005    LIMA graft to LAD,saphenous vein graft to diag.,circumflex, marginal,and to the RCA  . Cardiac catheterization    . Appendectomy    .  Esophagogastroduodenoscopy  02/10/2012    Procedure: ESOPHAGOGASTRODUODENOSCOPY (EGD);  Surgeon: Cleotis Nipper, MD;  Location: [facility] ENDOSCOPY;  Service: Endoscopy;  Laterality: N/A;  . Foreign body removal  02/10/2012    Procedure: FOREIGN BODY REMOVAL;  Surgeon: Cleotis Nipper, MD;  Location: [place];  Service: Endoscopy;  Laterality: N/A;  . Back surgery    . Esophagogastroduodenoscopy  06/29/2012    Procedure: ESOPHAGOGASTRODUODENOSCOPY (EGD);  Surgeon: Winfield Cunas., MD;  Location: [facility] ENDOSCOPY;  Service: Endoscopy;  Laterality: N/A;  . Foreign body removal  06/29/2012    Procedure: FOREIGN BODY REMOVAL;  Surgeon: Winfield Cunas., MD;  Location: [facility] ENDOSCOPY;  Service: Endoscopy;  Laterality: N/A;  . Savory dilation  06/29/2012    Procedure: SAVORY DILATION;  Surgeon: Winfield Cunas., MD;  Location: [facility] ENDOSCOPY;  Service: Endoscopy;  Laterality: N/A;  . Tee without cardioversion N/A 11/17/2013    Procedure: TRANSESOPHAGEAL ECHOCARDIOGRAM (TEE);  Surgeon: Larey Dresser, MD;  Location: [place];  Service: Cardiovascular;  Laterality: N/A;  . Cystoscopy/retrograde/ureteroscopy/stone extraction with basket Left 04/16/2014  Procedure: CYSTOSCOPY/RETROGRADE/LEFT URETEROSCOPY/STONE EXTRACTION WITH BASKET AND LASER LITHOTRIPSY;  Surgeon: Malka So, MD;  Location: [facility] ORS;  Service: Urology;  Laterality: Left;  With STENT  . Left heart catheterization with coronary angiogram N/A 04/02/2012    Procedure: LEFT HEART CATHETERIZATION WITH CORONARY ANGIOGRAM;  Surgeon: Sinclair Grooms, MD;  Location: [facility] CATH LAB;  Service: Cardiovascular;  Laterality: N/A;  . Loop recorder implant N/A 11/17/2013    Procedure: LOOP RECORDER IMPLANT;  Surgeon: Deboraha Sprang, MD;  Location: [facility] CATH LAB;  Service: Cardiovascular;  Laterality: N/A;    Current Outpatient Prescriptions  Medication Sig Dispense Refill  . atorvastatin (LIPITOR) 80 MG tablet Take 1 tablet (80 mg total) by mouth daily. 30  tablet 11  . bisoprolol (ZEBETA) 5 MG tablet Take 1 tablet (5 mg total) by mouth 2 (two) times daily. 60 tablet 1  . FLUoxetine (PROZAC) 10 MG capsule Take 20 mg by mouth daily.    . furosemide (LASIX) 20 MG tablet TAKE 1-2 TABLETS BY MOUTH EVERY DAY AS NEEDED 60 tablet 3  . glucose blood (ONE TOUCH ULTRA TEST) test strip     . methocarbamol (ROBAXIN) 750 MG tablet Take 750 mg by mouth every 8 (eight) hours as needed for muscle spasms.     . metroNIDAZOLE (FLAGYL) 500 MG tablet Take 500 mg by mouth daily.    . nitroGLYCERIN (NITROSTAT) 0.4 MG SL tablet Place 1 tablet (0.4 mg total) under the tongue every 5 (five) minutes as needed. For chest pain 25 tablet 0  . ONETOUCH DELICA LANCETS FINE MISC     . potassium citrate (UROCIT-K) 10 MEQ (1080 MG) SR tablet Take 10 mEq by mouth 2 (two) times daily.     . sitaGLIPtin (JANUVIA) 100 MG tablet Take 100 mg by mouth daily.    . traZODone (DESYREL) 150 MG tablet Take 150 mg by mouth at bedtime.    Marland Kitchen warfarin (COUMADIN) 5 MG tablet Take as directed by anticoagulation clinic 45 tablet 3   No current facility-administered medications for this visit.    Allergies  Allergen Reactions  . Cephalexin     Unknown  . Doxycycline     Unknown  . Keflex [Cephalexin]   . Lac Bovis Other (See Comments)    lactose intolerant  . Pentazocine Lactate     He passed out- he had a seizure.  This occurred around 2000  . Talwin [Pentazocine]     Review of Systems negative except from HPI and PMH  Physical Exam BP 104/64 mmHg  Pulse 73  Ht 5' 8"  (1.727 m)  Wt 211 lb (95.709 kg)  BMI 32.09 kg/m2 Well developed and well nourished in no acute distress wearing )2   Clear to ausculation Device pocket well healed; without hematoma or erythema.  There is no tethering  Regular rate and rhythm,  No clubbing cyanosis Alert and oriented, grossly normal motor and sensory function Skin Warm and Dry  ECG demonstrates sinus rhythm at 73 Interval  [birth year]  Assessment and  Plan  Loop recorder   Pulmonary embolism  Cryptogenic stroke  CAD with prior CABg  The patient has now been placed on anticoagulation because of his pulmonary embolism. Apparently his INR is a been very unstable. I looked at the data from the [facility] PE trial and 20% of the population had prior venous thromboembolism. I wonder, given the equivalent effect in this and the total population cohort, although it makes sense to put him on a  NOAC. It would be reasonable to try something different from dabigatran although 10% of that population had prior venous thromboembolus although not recently on a direct thrombin inhibitor.  I will refer this to Dr. [name]. We will plan to see him in one year) that we would follow-up his loop recorder would either be transplanted or in the event that decision were made to want to discontinue anticoagulation then ongoing surveillance for atrial fibrillation. Appropriate

## 2014-11-08 NOTE — Patient Instructions (Signed)
Your physician recommends that you continue on your current medications as directed. Please refer to the Current Medication list given to you today.  Your physician wants you to follow-up in: 1 year with Dr. Caryl Comes.  You will receive a reminder letter in the mail two months in advance. If you don't receive a letter, please call our office to schedule the follow-up appointment.

## 2014-11-09 ENCOUNTER — Ambulatory Visit (INDEPENDENT_AMBULATORY_CARE_PROVIDER_SITE_OTHER): Payer: Medicare Other | Admitting: General Practice

## 2014-11-09 DIAGNOSIS — Z5181 Encounter for therapeutic drug level monitoring: Secondary | ICD-10-CM

## 2014-11-09 LAB — POCT INR: INR: 1.3

## 2014-11-09 NOTE — Progress Notes (Signed)
Pre visit review using our clinic review tool, if applicable. No additional management support is needed unless otherwise documented below in the visit note. 

## 2014-11-09 NOTE — Progress Notes (Signed)
Agree with plan 

## 2014-11-11 ENCOUNTER — Encounter: Payer: Self-pay | Admitting: Internal Medicine

## 2014-11-16 ENCOUNTER — Encounter: Payer: Self-pay | Admitting: Internal Medicine

## 2014-11-16 ENCOUNTER — Ambulatory Visit (INDEPENDENT_AMBULATORY_CARE_PROVIDER_SITE_OTHER): Payer: Medicare Other | Admitting: General Practice

## 2014-11-16 DIAGNOSIS — Z5181 Encounter for therapeutic drug level monitoring: Secondary | ICD-10-CM | POA: Diagnosis not present

## 2014-11-16 LAB — POCT INR: INR: 1.7

## 2014-11-16 NOTE — Progress Notes (Signed)
Agree with plan 

## 2014-11-16 NOTE — Progress Notes (Signed)
Pre visit review using our clinic review tool, if applicable. No additional management support is needed unless otherwise documented below in the visit note. 

## 2014-11-17 ENCOUNTER — Ambulatory Visit (INDEPENDENT_AMBULATORY_CARE_PROVIDER_SITE_OTHER): Payer: Medicare Other | Admitting: *Deleted

## 2014-11-17 DIAGNOSIS — I639 Cerebral infarction, unspecified: Secondary | ICD-10-CM | POA: Diagnosis not present

## 2014-11-17 LAB — MDC_IDC_ENUM_SESS_TYPE_REMOTE

## 2014-11-22 ENCOUNTER — Encounter: Payer: Self-pay | Admitting: Internal Medicine

## 2014-11-22 NOTE — Progress Notes (Signed)
Loop recorder 

## 2014-11-29 ENCOUNTER — Ambulatory Visit (INDEPENDENT_AMBULATORY_CARE_PROVIDER_SITE_OTHER): Payer: Medicare Other | Admitting: Emergency Medicine

## 2014-11-29 ENCOUNTER — Encounter: Payer: Self-pay | Admitting: Emergency Medicine

## 2014-11-29 DIAGNOSIS — I2699 Other pulmonary embolism without acute cor pulmonale: Secondary | ICD-10-CM

## 2014-11-29 DIAGNOSIS — I6340 Cerebral infarction due to embolism of unspecified cerebral artery: Secondary | ICD-10-CM

## 2014-11-29 DIAGNOSIS — G4733 Obstructive sleep apnea (adult) (pediatric): Secondary | ICD-10-CM | POA: Diagnosis not present

## 2014-11-29 NOTE — Assessment & Plan Note (Signed)
Continue coumadin and insure tomorrow that he is in therapeutic range. His wife is worried about recurrent PE - doubt this as long as he has been therapeutic

## 2014-11-29 NOTE — Progress Notes (Signed)
Patient ID: [name], male    DOB: [birth year]  MRN: [id number]    Brief patient profile:  38 yowm never smoker with obesity and osa/appliance dep with h/o ? Asthma as child and then needed inhalers with colds only  since 2004 referred by [name] for eval of sob x 2013 after eval by [name] for hiccups in 2014 dx'd with ? Asbestosis.  History of Present Illness  02/11/2013 1st pulmonary eval cc indolent onset progressive doe esp out in hot weather x one year with some dry cough on hot air exposure and even indoors has to stop about 150 yards nl pace with stress test by [name] March 2014 with nl ef, low risk IHD.  Has used inhalers in past, but not in past year  Has noted some assoc with  swelling but no orthopnea.  rec GERD diet Continue prilosec 2 before bfast and 2 before supper. Stop losartan and lopressor(metaprolol) Start bystolic 10 mg twice daily for now    05/18/2014 extended "re-establish" ov/[name] re: unexplained hypoxemia/ fits of sob with loss of voice  Chief Complaint  Patient presents with  . Follow-up    Pt c/o increased SOB for the past several months. He was started on supplemental o2 in Aug 2015 during hospital admission.  He states that he gets SOB with walking any distance or even just at rest.   onset of hiccups x one year followed by gradually worsening  cough fits mostly daytime assoc with brief inability to speak lasting sev min then resolves with sob just as likely at rest as with ex Brings up Winberg mucus esp at hs on a bipap machine which seems to help sleep, no worse in am Has not tried inhalers  On max acid suppression per [name] but not diet  [facility], [name] now present saying our med sheet is correct but his reporting of previous symptoms is not (he says he was better last ov, she says not). Not clear why hasn't returned in 57 m but apparently dealing with stroke sequelae No swallowing eval in emr   On bipap per neuro "helps a lot" per pt but [name]  says still waking freq rec CTa > pos PE/ multiple    Admit date: 05/19/2014  Discharge date: 05/25/2014  Discharge Diagnoses:  Pulmonary embolism  Upper airway Cough Syndrome  Pleural Plaque  Acute hypoxic respiratory failure  HTN  Known CAD  GERD  Diabetes  Insomnia  Detailed Hospital Course:  This is a pleasant 70 y/o male who saw [name] day prior to ov for shortness of breath. He notes ongoing dyspnea and cough with Delap mucus production for several years, but in the last month he has had increased dyspnea even at rest. Notably, he was hospitalized for 3 days one month prior to admit for a kidney stone. He was discharged home on oxygen with a presumptive diagnosis of asbestosis. He went to [name] on 9/23 for the dyspnea. A CT angiogram was ordered and he was found to have a pulmonary embolism.  He was admitted to the hospital intensive care. ECHO was obtained: ECHO showed EF 55-60%, hypokinesis of the apicalanteroseptal myocardium, grade I diastolic dysfunction, but no evidence of right heart strain. He was treated supportively on Oxygen, IV heparin and coumadin overlap. NOAC therapy wa discussed but the pt decided on Coumadin. As of time of discharge he is now on day #7  Heparin but  His INR remains subtherapeutic but  he desires to go home and we feel he can safely do this on a LMWH overlap with his coumadin. Prior to discharge he was ambulated to assess Oxygen requirements.  Per nursing notes walked hallway s distress and s 02  Discharge Plan by active problems  Possibly Provoked pulmonary embolism, non-massive as hemodynamically stable  (discussed NOAC vs warfarin, he prefers warfarin)  Upper airway cough syndrome  Hx of questioned Asbestos related pleural disease> no evidence of asbestosis  Acute hypoxemic respiratory failure > due to PE  PLAN  -now on day #7 overlap. INR remains sub therapeutic. Will send him home on LMWH and coumadin. Will send him home on 12.5 mg today/25m  tomorrow and ck INR. Will go on LMWH at [id number].  -f/u [name] 10/2 at 10am  -plan three months warfarin (given this was a presumed provoked PE)  -titrate O2 for SpO2 > 92%, hopefully can d/c in future (started recently for suspected asbestosis, likely PE though)  OSA (felt central) f/b [name]  Plan  Resume BIPAP per home settings: 16/8 cm with a rate of 10 per minute.  Hypertension  Known CAD  PLAN  -continue preadmission b-blocker, statin  GERD  PLAN  - PPI  DM2  PLAN  -continue home meds  Insomnia  PLAN  - Continue home meds  Anemia  PLAN  -trend CBC  Significant Hospital tests/ studies  Consults: pharmacy  ECHO 9/24: EF 55-60%, hypokinesis of the apicalanteroseptal myocardium, grade I diastolic dysfunction, but no evidence of right heart strain.    Lab Results   Component  Value  Date    INR  1.76*  05/25/2014    INR  1.49  05/24/2014    INR  1.30  05/23/2014     05/30/14 extended post hosp ov/ transition of care / [name]  On coumadin 5 mg daily per coumadin Sharp per pt Chief Complaint  Patient presents with  . HFU    Pt states that there has been no improvement in his breathing since hospital d/c.    very confused with all aspects of care, [name] was helping [place] [name] has pages of redundant and conflicting sheets because she keeps old avs's and refers back to them for his current care.  Cc doe x across the room even on 02 whereas hosp notes say walking entire hallway off 02 s difficulty albeit at slow pace rec I will ask the coumadin Sharp to contact you  re timing for follow up and make sure you are taking the right dose Continue 2lpm 24/7 See Jacob EdOletta Lamase your abnormal swallowing study to see if anything can be done or you need to be referred to a medical center.     06/13/2014 f/u ov/[name] re: s/p PE/ ? Component vcd from es dysfunction / has not seen [name]  Chief Complaint  Patient  presents with  . Follow-up    PFT done today. Breathing is unchanged. No new co's today.   did [employer] on 02 2lpm poc at slow pace  And doing [place] x [place] neurology but burps worse when places it on. rec Let your bipap doctor know that your belching is worse when you put the bipap on at night Discuss with Jacob EdOletta Lamasour abnormal swallowing study from [facility] 05/19/14 to decide what if anything can be done and whether you need  to seek care at a medical center   ov/[name] re: 02 dep resp failure/ obesity/deconditioning/ sp pe 04/2014 on no pulmonary meds  Chief Complaint  Patient presents with  . Acute Visit    Increased SOB, chest tightness and coughing. Mucus is Dea and thick. Onset was 1 year ago.  starting rehab as of day of ov/ able to [employer] but resting x 3 to complete the shopping - not monitoring sats until after sits down on 2lpm continuous  Followed in coumadin Sharp but not sure re instructions for refills    No obvious day to day or daytime variabilty or excess or purulent mucus production or   cp or chest tightness, subjective wheeze overt sinus or hb symptoms. No unusual exp hx or h/o childhood pna/ asthma or knowledge of premature birth.  Sleeping poorly on bipap? Makes belching worse.  Also denies any obvious fluctuation of symptoms with weather or environmental changes or other aggravating or alleviating factors except as outlined above   Acute OV 11/29/14 -- 69 yo man, never smoker with obesity, nocturnal hypoventilation, PE dx in 9/'15. Followedu by Jacob Melvyn Sharp for dyspnea, especially since that time. He tells me that his dyspnea is worse over the last 2-3 weeks.  Right around that time his bystolic was changed to zebeta due to insurance coverage and the price.  He remains on coumadin. He has had some increased mucous production. Continues to have reflux and occasionally sees wheezing. He is on a reflux med but he can't recall the  name. He has gained 7 lbs since last time. He was getting PT but this ended about 2 weeks       Objective:   Physical Exam Filed Vitals:   11/29/14 1518  BP: 130/72  Pulse: 67  Height: 5' 6"  (1.676 m)  Weight: 214 lb (97.07 kg)  SpO2: 95%   Gen: Pleasant, well-nourished, in no distress,  normal affect  ENT: No lesions,  mouth clear,  oropharynx clear, no postnasal drip  Neck: No JVD, no TMG, no carotid bruits  Lungs: No use of accessory muscles, no dullness to percussion, clear without rales or rhonchi  Cardiovascular: RRR, heart sounds normal, no murmur or gallops, no peripheral edema  Musculoskeletal: No deformities, no cyanosis or clubbing  Neuro: alert, non focal  Skin: Warm, no lesions or rashes     CTa 05/19/14 Pulmonary embolus involving right upper lobe, right middle lobe and  bilateral lower lobes. The ventricular ratio suggest right heart  strain. There is no evidence of pulmonary infarct.       Assessment & Plan:  Pulmonary embolism Continue coumadin and insure tomorrow that he is in therapeutic range. His [name] is worried about recurrent PE - doubt this as long as he has been therapeutic   Obstructive sleep apnea Continue current nocturnal BiPAP   Dyspnea Multifactorial with obesity, deconditioning, hx PE. Also note that he relates the change to when his zebeta was started. He couldn't take bistolic due to cost. Certainly at high risk for secondary PAH. He has gained wt, has edema.   - will change zebeta to metoprolol - will take lasix 23m qd for next 3 days then back to prn - will check TTE to compare with 9/'15 - continue BiPAP - try to restart PT - rov w Jacob WMelvyn Novas7-10 days.

## 2014-11-29 NOTE — Assessment & Plan Note (Signed)
Continue current nocturnal BiPAP

## 2014-11-29 NOTE — Assessment & Plan Note (Signed)
Multifactorial with obesity, deconditioning, hx PE. Also note that he relates the change to when his zebeta was started. He couldn't take bistolic due to cost. Certainly at high risk for secondary PAH. He has gained wt, has edema.   - will change zebeta to metoprolol - will take lasix 14m qd for next 3 days then back to prn - will check TTE to compare with 9/'15 - continue BiPAP - try to restart PT - rov w Dr WMelvyn Novas7-10 days.

## 2014-11-29 NOTE — Patient Instructions (Addendum)
We will stop zebeta, change back to metoprolol twice a day. We will sen s script to your pharmacy.  We will perform a repeat echocardiogram  Take your lasix 54m daily for the next 3 days straight, then go back to using as needed for your swelling Follow with Dr WMelvyn Novasin 7 -10 days to review your echo, check your status after the medication changes.

## 2014-11-30 ENCOUNTER — Ambulatory Visit (INDEPENDENT_AMBULATORY_CARE_PROVIDER_SITE_OTHER): Payer: Medicare Other | Admitting: General Practice

## 2014-11-30 ENCOUNTER — Telehealth: Payer: Self-pay | Admitting: Internal Medicine

## 2014-11-30 DIAGNOSIS — Z5181 Encounter for therapeutic drug level monitoring: Secondary | ICD-10-CM | POA: Diagnosis not present

## 2014-11-30 LAB — POCT INR: INR: 2

## 2014-11-30 MED ORDER — METOPROLOL TARTRATE 50 MG PO TABS: 50.0000 mg | ORAL_TABLET | Freq: Two times a day (BID) | ORAL | Status: DC

## 2014-11-30 NOTE — Telephone Encounter (Signed)
Pt can be reached @ [phone].[name]

## 2014-11-30 NOTE — Telephone Encounter (Signed)
Pt wife returning call.Stanley A Dalton' °

## 2014-11-30 NOTE — Progress Notes (Signed)
Pre visit review using our clinic review tool, if applicable. No additional management support is needed unless otherwise documented below in the visit note. 

## 2014-11-30 NOTE — Telephone Encounter (Signed)
lmtcb X1 for pt's wife.  

## 2014-11-30 NOTE — Telephone Encounter (Signed)
Spoke with pt's wife, metoprolol was never sent in yesterday at visit.  This has been sent in.  Nothing further needed.

## 2014-11-30 NOTE — Progress Notes (Signed)
Agree with plan 

## 2014-12-02 ENCOUNTER — Ambulatory Visit (HOSPITAL_COMMUNITY): Payer: Medicare Other | Attending: Emergency Medicine

## 2014-12-02 DIAGNOSIS — I5021 Acute systolic (congestive) heart failure: Secondary | ICD-10-CM

## 2014-12-02 DIAGNOSIS — I2699 Other pulmonary embolism without acute cor pulmonale: Secondary | ICD-10-CM | POA: Insufficient documentation

## 2014-12-02 NOTE — Progress Notes (Signed)
2D Echo completed. 12/02/2014

## 2014-12-06 ENCOUNTER — Ambulatory Visit: Payer: Medicare Other

## 2014-12-08 ENCOUNTER — Telehealth: Payer: Self-pay | Admitting: Internal Medicine

## 2014-12-08 ENCOUNTER — Encounter: Payer: Self-pay | Admitting: Internal Medicine

## 2014-12-08 ENCOUNTER — Other Ambulatory Visit (INDEPENDENT_AMBULATORY_CARE_PROVIDER_SITE_OTHER): Payer: Medicare Other

## 2014-12-08 ENCOUNTER — Ambulatory Visit (INDEPENDENT_AMBULATORY_CARE_PROVIDER_SITE_OTHER): Payer: Medicare Other | Admitting: Internal Medicine

## 2014-12-08 ENCOUNTER — Ambulatory Visit (INDEPENDENT_AMBULATORY_CARE_PROVIDER_SITE_OTHER)
Admission: RE | Admit: 2014-12-08 | Discharge: 2014-12-08 | Disposition: A | Discharge status: Home / Self Care | Payer: Medicare Other | Source: Ambulatory Visit | Attending: Internal Medicine | Admitting: Internal Medicine

## 2014-12-08 VITALS — BP 98/64 | HR 66 | Ht 66.0 in | Wt 211.2 lb

## 2014-12-08 DIAGNOSIS — J9610 Chronic respiratory failure, unspecified whether with hypoxia or hypercapnia: Secondary | ICD-10-CM

## 2014-12-08 DIAGNOSIS — I6340 Cerebral infarction due to embolism of unspecified cerebral artery: Secondary | ICD-10-CM

## 2014-12-08 DIAGNOSIS — I2699 Other pulmonary embolism without acute cor pulmonale: Secondary | ICD-10-CM

## 2014-12-08 DIAGNOSIS — R0600 Dyspnea, unspecified: Secondary | ICD-10-CM

## 2014-12-08 DIAGNOSIS — R0602 Shortness of breath: Secondary | ICD-10-CM | POA: Diagnosis not present

## 2014-12-08 DIAGNOSIS — R05 Cough: Secondary | ICD-10-CM | POA: Diagnosis not present

## 2014-12-08 LAB — CBC WITH DIFFERENTIAL/PLATELET
BASOS PCT: 0.4 % (ref 0.0–3.0)
Basophils Absolute: 0 10*3/uL (ref 0.0–0.1)
Eosinophils Absolute: 0.1 10*3/uL (ref 0.0–0.7)
Eosinophils Relative: 1.1 % (ref 0.0–5.0)
HEMATOCRIT: 35.5 % — AB (ref 39.0–52.0)
HEMOGLOBIN: 12.3 g/dL — AB (ref 13.0–17.0)
LYMPHS ABS: 1.1 10*3/uL (ref 0.7–4.0)
Lymphocytes Relative: 15 % (ref 12.0–46.0)
MCHC: 34.5 g/dL (ref 30.0–36.0)
MCV: 91 fl (ref 78.0–100.0)
MONOS PCT: 11 % (ref 3.0–12.0)
Monocytes Absolute: 0.8 10*3/uL (ref 0.1–1.0)
Neutro Abs: 5.2 10*3/uL (ref 1.4–7.7)
Neutrophils Relative %: 72.5 % (ref 43.0–77.0)
PLATELETS: 256 10*3/uL (ref 150.0–400.0)
RBC: 3.9 Mil/uL — ABNORMAL LOW (ref 4.22–5.81)
RDW: 13.2 % (ref 11.5–15.5)
WBC: 7.2 10*3/uL (ref 4.0–10.5)

## 2014-12-08 LAB — BASIC METABOLIC PANEL
BUN: 18 mg/dL (ref 6–23)
CHLORIDE: 101 meq/L (ref 96–112)
CO2: 31 mEq/L (ref 19–32)
Calcium: 9.2 mg/dL (ref 8.4–10.5)
Creatinine, Ser: 0.83 mg/dL (ref 0.40–1.50)
GFR: 97.16 mL/min (ref >60.00)
Glucose, Bld: 111 mg/dL — ABNORMAL HIGH (ref 70–99)
Potassium: 3.8 mEq/L (ref 3.5–5.1)
Sodium: 137 mEq/L (ref 135–145)

## 2014-12-08 LAB — TSH: TSH: 2.44 u[IU]/mL (ref 0.35–4.50)

## 2014-12-08 LAB — BRAIN NATRIURETIC PEPTIDE: Pro B Natriuretic peptide (BNP): 148 pg/mL — ABNORMAL HIGH (ref 0.0–100.0)

## 2014-12-08 NOTE — Progress Notes (Signed)
Patient ID: [name], male    DOB: Jan 20, 1946  MRN: [id number]    Brief patient profile:  18 yowm never smoker with obesity and osa/appliance dep with h/o ? Asthma as child and then needed inhalers with colds only  since 2004 referred by Dr [name] for eval of sob x 2013 after eval by [name] for hiccups in 2014 dx'd with ? Asbestosis.  History of Present Illness  02/11/2013 1st pulmonary eval cc indolent onset progressive doe esp out in hot weather x one year with some dry cough on hot air exposure and even indoors has to stop about 150 yards nl pace with stress test by [facility] March 2014 with nl ef, low risk IHD.  Has used inhalers in past, but not in past year  Has noted some assoc with  swelling but no orthopnea.  rec GERD diet Continue prilosec 2 before bfast and 2 before supper. Stop losartan and lopressor(metaprolol) Start bystolic 10 mg twice daily for now    05/18/2014 extended "re-establish" ov/[name] re: unexplained hypoxemia/ fits of sob with loss of voice  Chief Complaint  Patient presents with  . Follow-up    Pt c/o increased SOB for the past several months. He was started on supplemental o2 in Aug 2015 during hospital admission.  He states that he gets SOB with walking any distance or even just at rest.   onset of hiccups x one year followed by gradually worsening  cough fits mostly daytime assoc with brief inability to speak lasting sev min then resolves with sob just as likely at rest as with ex Brings up [name] mucus esp at hs on a bipap machine which seems to help sleep, no worse in am Has not tried inhalers  On max acid suppression per Dr [name] but not diet  [facility], wife now present saying our med sheet is correct but his reporting of previous symptoms is not (he says he was better last ov, she says not). Not clear why hasn't returned in 8 m but apparently dealing with stroke sequelae No swallowing eval in emr   On bipap per neuro "helps a lot" per pt but wife  says still waking freq rec CTa > pos PE/ multiple    Admit date: 05/19/2014  Discharge date: 05/25/2014  Discharge Diagnoses:  Pulmonary embolism  Upper airway Cough Syndrome  Pleural Plaque  Acute hypoxic respiratory failure  HTN  Known CAD  GERD  Diabetes  Insomnia  Detailed Hospital Course:  This is a pleasant 69 y/o male who saw [name] day prior to ov for shortness of breath. He notes ongoing dyspnea and cough with [place] mucus production for several years, but in the last month he has had increased dyspnea even at rest. Notably, he was hospitalized for 3 days one month prior to admit for a kidney stone. He was discharged home on oxygen with a presumptive diagnosis of asbestosis. He went to [name] on 9/23 for the dyspnea. A CT angiogram was ordered and he was found to have a pulmonary embolism.  He was admitted to the hospital intensive care. ECHO was obtained: ECHO showed EF 55-60%, hypokinesis of the apicalanteroseptal myocardium, grade I diastolic dysfunction, but no evidence of right heart strain. He was treated supportively on Oxygen, IV heparin and coumadin overlap. NOAC therapy wa discussed but the pt decided on Coumadin. As of time of discharge he is now on day #7  Heparin but  His INR remains subtherapeutic but  he desires to go home and we feel he can safely do this on a LMWH overlap with his coumadin. Prior to discharge he was ambulated to assess Oxygen requirements.  Per nursing notes walked hallway s distress and s 02  Discharge Plan by active problems  Possibly Provoked pulmonary embolism, non-massive as hemodynamically stable  (discussed NOAC vs warfarin, he prefers warfarin)  Upper airway cough syndrome  Hx of questioned Asbestos related pleural disease> no evidence of asbestosis  Acute hypoxemic respiratory failure > due to PE  PLAN  -now on day #7 overlap. INR remains sub therapeutic. Will send him home on LMWH and coumadin. Will send him home on 12.5 mg today/10mg   tomorrow and ck INR. Will go on LMWH at 90mg  q12.  -f/u [place] [facility] 10/2 at 10am  -plan three months warfarin (given this was a presumed provoked PE)  -titrate O2 for SpO2 > 92%, hopefully can d/c in future (started recently for suspected asbestosis, likely PE though)  OSA (felt central) f/b [name]  Plan  Resume BIPAP per home settings: 16/8 cm with a rate of 10 per minute.  Hypertension  Known CAD  PLAN  -continue preadmission b-blocker, statin  GERD  PLAN  - PPI  DM2  PLAN  -continue home meds  Insomnia  PLAN  - Continue home meds  Anemia  PLAN  -trend CBC  Significant Hospital tests/ studies  Consults: pharmacy  ECHO 9/24: EF 55-60%, hypokinesis of the apicalanteroseptal myocardium, grade I diastolic dysfunction, but no evidence of right heart strain.    Lab Results   Component  Value  Date    INR  1.76*  05/25/2014    INR  1.49  05/24/2014    INR  1.30  05/23/2014     05/30/14 extended post hosp ov/ transition of care / [name]  On coumadin 5 mg daily per coumadin clinic per pt Chief Complaint  Patient presents with  . HFU    Pt states that there has been no improvement in his breathing since hospital d/c.    very confused with all aspects of care, daughter was helping now flying back to [place] "you should ask her" Wife has pages of redundant and conflicting sheets because she keeps old avs's and refers back to them for his current care.  Cc doe x across the room even on 02 whereas hosp notes say walking entire hallway off 02 s difficulty albeit at slow pace rec I will ask the coumadin clinic to contact you  re timing for follow up and make sure you are taking the right dose Continue 2lpm 24/7 See Dr [name] re your abnormal swallowing study to see if anything can be done or you need to be referred to a medical Sharp.     06/13/2014 f/u ov/[name] re: s/p PE/ ? Component vcd from es dysfunction / has not seen Dr [name] yet  Chief Complaint  Patient  presents with  . Follow-up    PFT done today. Breathing is unchanged. No new co's today.   did [employer] on 02 2lpm poc at slow pace  And doing PT twice daily x 1 hour through the [place]  Does fine on bipap per neurology but burps worse when places it on. rec Let your bipap doctor know that your belching is worse when you put the bipap on at night Discuss with Dr [name] your abnormal swallowing study from [facility] 05/19/14 to decide what if anything can be done and whether you need  to seek care at a medical Sharp   10/03/14 acute ov /Sarah Baez re: 02 dep resp failure/ obesity/deconditioning/ sp pe 04/2014 on no pulmonary meds  Chief Complaint  Patient presents with  . Acute Visit    Increased SOB, chest tightness and coughing. Mucus is [place] and thick. Onset was 1 year ago.  starting rehab as of day of ov/ able to [employer] but resting x 3 to complete the shopping - not monitoring sats until after sits down on 2lpm continuous  Followed in coumadin clinic but not sure re instructions for refills  rec rec 2lpm 24/7 but 3lpm pulsed when walking       Acute NP OV 11/29/14    never smoker with obesity, nocturnal hypoventilation, PE dx in 04/2014. Followed  by Dr [name] for dyspnea, especially since that time.   dyspnea is worse x 2-3 weeks.  Right around that time his bystolic was changed to zebeta due to insurance coverage and the price.  He remains on coumadin. He has had some increased mucous production. Continues to have reflux and occasionally sees wheezing. He is on a reflux med but he can't recall the name. He has gained 7 lbs since last time. He was getting PT but this ended about 2 weeks  rec We will stop zebeta, change back to metoprolol twice a day. We will sen s script to your pharmacy.  We will perform a repeat echocardiogram  Take your lasix 40mg  daily for the next 3 days straight, then go back to using as needed for your swelling    12/08/2014 f/u ov/Tinika Bucknam re: breathing worse since Mid March 2016 p  finished rehab on 2lpm 24/7 and 3lpm with walking Chief Complaint  Patient presents with  . Follow-up    Pt states SOB, cough and chest tightness are unchanged since the last visit 11/29/14.   sleeping fine on bipap / 02 2lpm per neuro Taking lasix 20 mg 2 each am as maint (misunderstood instructions)  No obvious day to day or daytime variabilty or assoc chronic cough or cp or chest tightness, subjective wheeze overt sinus or hb symptoms. No unusual exp hx or h/o childhood pna/ asthma or knowledge of premature birth.  Sleeping ok without nocturnal  or early am exacerbation  of respiratory  c/o's or need for noct saba. Also denies any obvious fluctuation of symptoms with weather or environmental changes or other aggravating or alleviating factors except as outlined above   Current Medications, Allergies, Complete Past Medical History, Past Surgical History, Family History, and Social History were reviewed in [employer] record.  ROS  The following are not active complaints unless bolded sore throat, dysphagia, dental problems, itching, sneezing,  nasal congestion or excess/ purulent secretions, ear ache,   fever, chills, sweats, unintended wt loss, pleuritic or exertional cp, hemoptysis,  orthopnea pnd or leg swelling, presyncope, palpitations, heartburn, abdominal pain, anorexia, nausea, vomiting, diarrhea  or change in bowel or urinary habits, change in stools or urine, dysuria,hematuria,  rash, arthralgias, visual complaints, headache, numbness weakness or ataxia or problems with walking or coordination,  change in mood/affect or memory.            Objective:   Physical Exam   Gen: obese wm nad  Wt Readings from Last 3 Encounters:  12/08/14 211 lb 3.2 oz (95.8 kg)  11/29/14 214 lb (97.07 kg)  11/08/14 211 lb (95.709 kg)    Vital signs reviewed   ENT: No lesions,  mouth clear,  oropharynx clear, no postnasal drip  Neck: No JVD, no TMG, no carotid  bruits  Lungs: No use of accessory muscles, no dullness to percussion, clear without rales or rhonchi  Cardiovascular: RRR, heart sounds normal, no murmur or gallops, no peripheral edema  Musculoskeletal: No deformities, no cyanosis or clubbing  Neuro: alert, non focal  Skin: Warm, no lesions or rashes      I personally reviewed images and agree with radiology impression as follows:  CXR:  12/08/14 No active cardiopulmonary disease.  Labs ordered/ reviewed    Lab 12/08/14 1009  NA 137  K 3.8  CL 101  CO2 31  BUN 18  CREATININE 0.83  GLUCOSE 111*    Recent Labs Lab 12/08/14 1009  HGB 12.3*  HCT 35.5*  WBC 7.2  PLT 256.0     Lab Results  Component Value Date   TSH 2.44 12/08/2014     Lab Results  Component Value Date   PROBNP 148.0* 12/08/2014              Assessment & Plan:

## 2014-12-08 NOTE — Progress Notes (Signed)
Quick Note:  LMTCB on home and cell numbers ______

## 2014-12-08 NOTE — Telephone Encounter (Signed)
cxr results: Result Note     Call pt: Reviewed cxr and no acute change so no change in recommendations made at ov   Lab results: Result Note     Call patient : Studies are unremarkable, no change in recs  --   Spouse aware of results. Nothing further needed

## 2014-12-08 NOTE — Patient Instructions (Signed)
Please remember to go to the lab and x-ray department downstairs for your tests - we will call you with the results when they are available.    GERD (REFLUX)  is an extremely common cause of respiratory symptoms just like yours , many times with no obvious heartburn at all.    It can be treated with medication, but also with lifestyle changes including avoidance of late meals, excessive alcohol, smoking cessation, and avoid fatty foods, chocolate, peppermint, colas, red wine, and acidic juices such as orange juice.  NO MINT OR MENTHOL PRODUCTS SO NO COUGH DROPS  USE SUGARLESS CANDY INSTEAD (Jolley ranchers or Stover's or Life Savers) or even ice chips will also do - the key is to swallow to prevent all throat clearing. NO OIL BASED VITAMINS - use powdered substitutes.  If not improving, first step is See Tammy NP w/in 2 weeks with all your medications, even over the counter meds, separated in two separate bags, the ones you take no matter what vs the ones you stop once you feel better and take only as needed when you feel you need them.   Tammy  will generate for you a new user friendly medication calendar that will put [place] all on the same page re: your medication use.     Without this process, it simply isn't possible to assure that we are providing  your outpatient care  with  the attention to detail we feel you deserve.   If we cannot assure that you're getting that kind of care,  then we cannot manage your problem effectively from this clinic.  Once you have seen Tammy and we are sure that we're all on the same page with your medication use she will arrange follow up with me.

## 2014-12-08 NOTE — Progress Notes (Signed)
Quick Note:  LMTCB and home and cell numbers ______

## 2014-12-11 ENCOUNTER — Encounter: Payer: Self-pay | Admitting: Internal Medicine

## 2014-12-11 NOTE — Assessment & Plan Note (Signed)
-   See CTa 05/19/14 and admit > coumadin rx for now per coumadin clinic at [facility] - Echo 05/20/14 Hypokinesis of the distal septal wall with overall normal LV function; grade 1 diastolic dysfunction; normal RV function; trace MR - Venous dopplers 06/08/2014 > neg bilaterally   Need to keep INR well into therapeutic range indefinitely with more obesity the main ongoing concern for recurrence

## 2014-12-11 NOTE — Assessment & Plan Note (Signed)
-   [facility]fts at [facility] 12/2012 FEV1  2.1 (71%) ratio 79 and DLCO 57 corrects to 82  - pFT's 06/09/2013  FEV 1  2.10 (78%) ratio 81 and no change p B2 and DLCO 75%   VC = 2.83=77% - PFTs  06/13/2014  FEV1  2.02 (76%) ratio 79 and no change p B2 and DLCO 70% / corrects to 99 - 02/11/2013  Walked RA x 3 laps @ 185 ft each stopped due to end of study no desat   - placed on 27 Mar 2014 p admit ? Why needs it?  - 05/18/2014   Walked 2lpm x one lap @ 185 stopped due to legs gave out with sats still 98%  - 05/30/2014   Walked 2lpm  x one lap @ 185 stopped due to  Tired, legs gave out, sats still 94%  - CTa 05/19/2014 > POS PE    -  Echo 12/02/14 Left ventricle: The cavity size was normal. Wall thickness was normal. Systolic function was normal. The estimated ejection fraction was in the range of 55% to 60%. Doppler parameters are consistent with abnormal left ventricular relaxation (grade 1 diastolic dysfunction). - Mitral valve: Calcified annulus. Mildly thickened leaflets . - Pulmonary arteries: PA peak pressure: 34 mm Hg    Continues to have multifactorial sob but nothing else to offer at this time - continues to make mistakes on maint vs prn, esp lasix  I had an extended discussion with the patient reviewing all relevant studies completed to date and  lasting 15 to 20 minutes of a 25 minute visit on the following ongoing concerns:     Each maintenance medication was reviewed in detail including most importantly the difference between maintenance and as needed and under what circumstances the prns are to be used. This was done in the context of a medication calendar review which provided the patient with a user-friendly unambiguous mechanism for medication administration and reconciliation and provides an action plan for all active problems. It is critical that this be shown to every doctor  for modification during the office visit if necessary so the patient can use it as a working document.

## 2014-12-11 NOTE — Assessment & Plan Note (Signed)
Placed on 02 at d/c 03/2014  - 10/03/2014   Walked 2lpm x 10 ft desat but on 3lpm pulsed completed 2 laps at 185 ft each with adequate sats - 12/08/2014   Walked 3lpm x one lap @ 185 stopped due to  Sob but no desat nl pace  rec as of 12/08/2014 2lpm 24/7 but 3lpm pulsed with ex

## 2014-12-15 DIAGNOSIS — M545 Low back pain: Secondary | ICD-10-CM | POA: Diagnosis not present

## 2014-12-15 DIAGNOSIS — G8929 Other chronic pain: Secondary | ICD-10-CM | POA: Diagnosis not present

## 2014-12-15 DIAGNOSIS — G894 Chronic pain syndrome: Secondary | ICD-10-CM | POA: Diagnosis not present

## 2014-12-15 DIAGNOSIS — I6930 Unspecified sequelae of cerebral infarction: Secondary | ICD-10-CM | POA: Diagnosis not present

## 2014-12-16 ENCOUNTER — Ambulatory Visit (INDEPENDENT_AMBULATORY_CARE_PROVIDER_SITE_OTHER): Payer: Medicare Other | Admitting: *Deleted

## 2014-12-16 DIAGNOSIS — I639 Cerebral infarction, unspecified: Secondary | ICD-10-CM

## 2014-12-17 ENCOUNTER — Ambulatory Visit (INDEPENDENT_AMBULATORY_CARE_PROVIDER_SITE_OTHER): Payer: Medicare Other | Admitting: Physician Assistant

## 2014-12-17 VITALS — BP 107/68 | HR 66 | Temp 97.4°F | Resp 16 | Ht 66.0 in | Wt 212.0 lb

## 2014-12-17 DIAGNOSIS — M5010 Cervical disc disorder with radiculopathy, unspecified cervical region: Secondary | ICD-10-CM | POA: Diagnosis not present

## 2014-12-17 DIAGNOSIS — Z8673 Personal history of transient ischemic attack (TIA), and cerebral infarction without residual deficits: Secondary | ICD-10-CM | POA: Diagnosis not present

## 2014-12-17 DIAGNOSIS — F32A Depression, unspecified: Secondary | ICD-10-CM

## 2014-12-17 DIAGNOSIS — I6340 Cerebral infarction due to embolism of unspecified cerebral artery: Secondary | ICD-10-CM

## 2014-12-17 DIAGNOSIS — F329 Major depressive disorder, single episode, unspecified: Secondary | ICD-10-CM

## 2014-12-17 DIAGNOSIS — Z9181 History of falling: Secondary | ICD-10-CM | POA: Diagnosis not present

## 2014-12-17 NOTE — Progress Notes (Signed)
12/17/2014 at 1:11 PM  [name] / DOB: November 27, 1945 / MRN: [id number]  The patient has History of seizure disorder; Depression; Back pain; Hearing loss; Arrhythmia; Diverticulitis; H/O alcohol abuse; CAD (coronary artery disease); HTN (hypertension); Diabetes mellitus; Seizure disorder; Dysphagia, unspecified(787.20); Dyspnea; Renal failure; Pleural plaque consistent with asbestos exposure by CT 10/2012; Cough; Obstructive sleep apnea; Obesity, unspecified; Embolic stroke; S/P lumbar spine operation; Nerve root pain; Personal history of other diseases of circulatory system; Ureteral colic; Left ureteral stone; Hemiparesis and speech and 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  SUBJECTIVE  Chief complaint: Follow-up and Depression   Patient here for a number of reasons.  Wanting to go back on Bystoli due to a fall that occurred yesterday.  Patient taking Metoprolol and tizanidine.  Patient was at the [place] when this occurred and no imaging took place.  They advised that he stop taking tizanidine and cut bact the metoprolol by 1/2.  Patient has improved greatly since that time and is back to baseline per his wife and caregiver. He was prescribed tizanidine, lyrica and lidocaine patches.  The lidocaine patches were not affordable.  Taking Lyrica 50 TID and continued on this.   Patient is managed by psychiatry [name] in psychiatry. He is 60 mg qd.  He feels depressed because he has lost his license and is losing his mobility.  He would like to get out more often but his friends won't take him.  Reports poorer concentration since the stroke.   He is in need of a lift chair for his home as he has extreme difficulty with ambulating up and down stairs 2/2 to his stroke.    He complains of numbness and pain along the C7-C8  dermatome that comes and goes.  He denies neck pain.     He  has a past medical history of Coronary artery disease; Hyperlipidemia; Hypertension; History of seizure disorder; History of atrial flutter; Depression; Back pain; Hearing loss; Diverticulitis; H/O alcohol abuse; Diabetes mellitus; GERD (gastroesophageal reflux disease); Seizures; Headache(784.0); Arthritis; Sleep apnea; echocardiogram; AKI (acute kidney injury); Atrial flutter; Stroke; Shortness of breath; and Renal stone (04/15/2014).    Medications reviewed and updated by myself where necessary, and exist elsewhere in the encounter.   [name] is allergic to cephalexin; doxycycline; keflex; lac bovis; pentazocine lactate; and talwin. He  reports that he has never smoked. He has never used smokeless tobacco. He reports that he does not drink alcohol or use illicit drugs. He  reports that he does not currently engage in sexual activity. The patient  has past surgical history that includes Partial colectomy; Orchiectomy; Umbilical hernia repair; Cervical fusion; Coronary artery bypass graft (2005); Cardiac catheterization; Appendectomy; Esophagogastroduodenoscopy (02/10/2012); Foreign Body Removal (02/10/2012); Back surgery; Esophagogastroduodenoscopy (06/29/2012); Foreign Body Removal (06/29/2012); Savory dilation (06/29/2012); TEE without cardioversion (N/A, 11/17/2013); Cystoscopy/retrograde/ureteroscopy/stone extraction with basket (Left, 04/16/2014); left heart catheterization with coronary angiogram (N/A, 04/02/2012); and loop recorder implant (N/A, 11/17/2013).  His family history includes Asthma in his mother; Emphysema in his mother; Heart disease in his father; Hypertension in his mother; Pancreatic cancer in his paternal uncle; Prostate cancer in his paternal grandfather; Stroke in his paternal grandmother. There is no history of Heart attack.  Review of Systems  Constitutional: Negative for fever and chills.  Eyes: Negative.   Respiratory:  Negative for cough.   Cardiovascular: Negative  for chest pain.  Gastrointestinal: Negative for heartburn, nausea and vomiting.  Genitourinary: Negative for dysuria.  Musculoskeletal: Positive for falls. Negative for myalgias and neck pain.  Skin: Negative.   Neurological: Negative for dizziness and headaches.  Psychiatric/Behavioral: Positive for depression.    OBJECTIVE  His  height is 5' 6"  (1.676 m) and weight is 212 lb (96.163 kg). His oral temperature is 97.4 F (36.3 C). His blood pressure is 107/68 and his pulse is 66. His respiration is 16 and oxygen saturation is 97%.  The patient's body mass index is 34.23 kg/(m^2).  Physical Exam  Constitutional: He is oriented to person, place, and time.  Cardiovascular: Exam reveals no gallop and no friction rub.   No murmur heard. Respiratory: Effort normal and breath sounds normal.  GI: Soft. Bowel sounds are normal.  Musculoskeletal: He exhibits no tenderness.  Neurological: He is alert and oriented to person, place, and time. No cranial nerve deficit. He exhibits abnormal muscle tone. Coordination abnormal.  Skin: Skin is warm and dry.  Psychiatric: He has a normal mood and affect.    No results found for this or any previous visit (from the past 24 hour(s)).  ASSESSMENT & PLAN  [name] was seen today for follow-up and depression.  Diagnoses and all orders for this visit:  History of stroke: [facility] per [facility]'s request. She will be sending forms to [facility] for lift chair.   Depression: Patient to return psychiatry  History of recent fall: Patient to stop Tizanidine.  Pressure and heart rate normal today.  He will be followed [facility] in the coming weeks.   Cervical disc disorder with radiculopathy of cervical region: Patient with a history of cervical fusion at the level of C6-C7 on CT from 2015. Patient to continue on Lyrica.     The patient was advised to call or come back to  clinic if he does not see an improvement in symptoms, or worsens with the above plan.   [name], MHS, PA-C [facility] 12/17/2014 1:11 PM

## 2014-12-21 ENCOUNTER — Ambulatory Visit (INDEPENDENT_AMBULATORY_CARE_PROVIDER_SITE_OTHER): Payer: Medicare Other | Admitting: Surgery

## 2014-12-21 ENCOUNTER — Ambulatory Visit: Payer: Medicare Other

## 2014-12-21 DIAGNOSIS — Z5181 Encounter for therapeutic drug level monitoring: Secondary | ICD-10-CM | POA: Diagnosis not present

## 2014-12-21 LAB — POCT INR: INR: 3.1

## 2014-12-21 NOTE — Progress Notes (Signed)
Loop recorder 

## 2014-12-22 ENCOUNTER — Telehealth: Payer: Self-pay | Admitting: *Deleted

## 2014-12-22 DIAGNOSIS — I2510 Atherosclerotic heart disease of native coronary artery without angina pectoris: Secondary | ICD-10-CM | POA: Diagnosis not present

## 2014-12-22 DIAGNOSIS — E119 Type 2 diabetes mellitus without complications: Secondary | ICD-10-CM | POA: Diagnosis not present

## 2014-12-22 DIAGNOSIS — E785 Hyperlipidemia, unspecified: Secondary | ICD-10-CM | POA: Diagnosis not present

## 2014-12-22 DIAGNOSIS — E291 Testicular hypofunction: Secondary | ICD-10-CM | POA: Diagnosis not present

## 2014-12-22 DIAGNOSIS — K219 Gastro-esophageal reflux disease without esophagitis: Secondary | ICD-10-CM | POA: Diagnosis not present

## 2014-12-22 DIAGNOSIS — N528 Other male erectile dysfunction: Secondary | ICD-10-CM | POA: Diagnosis not present

## 2014-12-22 DIAGNOSIS — N529 Male erectile dysfunction, unspecified: Secondary | ICD-10-CM | POA: Diagnosis not present

## 2014-12-22 NOTE — Telephone Encounter (Signed)
Call returned from [place] at [facility] and instructed that pt needs to be Lovenox bridged if he is to be off coumadin  She states he does not need to be off coumadin for the root canal on May 4th but he does need to hold coumadin for the procedures on June 9th and June 22nd. Also informed if he wants his insurance to pay for the Lovenox he will have to have bridging instructions and Lovenox ordered at [place] . [name] states he has an appt with PCP in [place]  tomorrow and asked if possible perhaps Dentist and the PCP could talk regarding this pt when he is at this appt and she states she is sending request for this. [name] given our fax and phone number if any further instructions or questions A second phone call received from [place] at [facility] and she states that Dr Hardin Negus the Dentist wants to know the results of his INR done on Jan 04 2015  Please call [name] with the results at [phone] ext [phone]

## 2014-12-27 ENCOUNTER — Telehealth: Payer: Self-pay

## 2014-12-27 ENCOUNTER — Encounter: Payer: Self-pay | Admitting: Internal Medicine

## 2014-12-27 NOTE — Telephone Encounter (Signed)
Pt's wife called and states that she brought in a packed of paperwork For Dr. [name] to sign. The patient is needing medical supplies as well as a note stating he is comitant to sing a POA. Please advise pt's wife of status of this ppw

## 2014-12-27 NOTE — Telephone Encounter (Signed)
Pt needs letter stating that he is competent to sign poa.

## 2014-12-28 NOTE — Telephone Encounter (Signed)
Pt's wife came to get the paperwork.  The form requesting the chair was not included in what she picked up and she also needs the letter stating her husband is competent for the lawyer asap.  Please call [phone]

## 2014-12-29 NOTE — Telephone Encounter (Signed)
Dr. [name],  Pt's wife is calling again regarding letter saying pt is competent to sign poa.

## 2014-12-30 ENCOUNTER — Telehealth: Payer: Self-pay | Admitting: Emergency Medicine

## 2014-12-30 NOTE — Telephone Encounter (Signed)
I called and left a message for [name] regarding paperwork needed regarding his POA returning. I told her I would be working Saturday and Sunday hopefully resolve the situation at that time.

## 2015-01-04 ENCOUNTER — Telehealth: Payer: Self-pay

## 2015-01-04 ENCOUNTER — Ambulatory Visit (INDEPENDENT_AMBULATORY_CARE_PROVIDER_SITE_OTHER): Payer: Medicare Other | Admitting: *Deleted

## 2015-01-04 DIAGNOSIS — R29898 Other symptoms and signs involving the musculoskeletal system: Secondary | ICD-10-CM

## 2015-01-04 DIAGNOSIS — Z5181 Encounter for therapeutic drug level monitoring: Secondary | ICD-10-CM | POA: Diagnosis not present

## 2015-01-04 LAB — POCT INR: INR: 2.5

## 2015-01-04 NOTE — Telephone Encounter (Signed)
She would like Dr. [name] to call her concerning 2 prescriptions. One about a chair and one concerning POA? Please advise at [phone]

## 2015-01-05 NOTE — Telephone Encounter (Signed)
PATIENT'S WIFE (PATRICIA) CALLED BACK AGAIN TO HAVE DR. DAUB TO CALL HER REGARDING HER HUSBAND'S LIFT CHAIR AND TO GET DR. DAUB TO WRITE SOMETHING ON LETTER HEAD REGARDING POA CAPABILITY. BEST PHONE [phone] (Beatrice) [place]

## 2015-01-06 NOTE — Telephone Encounter (Signed)
Spoke with pt's wife, she just needs something on a Rx or letterhead stating that her husband is competant enough to sign the papers for her to be her power of attorney. The lawyer needs this before she can do this.  SHe would also like an Rx for lift recliner. She can pick both up when ready.

## 2015-01-08 DIAGNOSIS — R29898 Other symptoms and signs involving the musculoskeletal system: Secondary | ICD-10-CM | POA: Insufficient documentation

## 2015-01-08 NOTE — Telephone Encounter (Signed)
I spoke with Amy Littrel and we will be working on getting these papers

## 2015-01-08 NOTE — Telephone Encounter (Signed)
Called patient to advise forms/ medical necessity and order for lift chair is ready for pick up

## 2015-01-09 ENCOUNTER — Encounter (INDEPENDENT_AMBULATORY_CARE_PROVIDER_SITE_OTHER): Payer: Self-pay

## 2015-01-09 DIAGNOSIS — Z0289 Encounter for other administrative examinations: Secondary | ICD-10-CM

## 2015-01-16 ENCOUNTER — Ambulatory Visit (INDEPENDENT_AMBULATORY_CARE_PROVIDER_SITE_OTHER): Payer: Medicare Other | Admitting: *Deleted

## 2015-01-16 DIAGNOSIS — I639 Cerebral infarction, unspecified: Secondary | ICD-10-CM

## 2015-01-17 LAB — CUP PACEART REMOTE DEVICE CHECK: Date Time Interrogation Session: 20160524163253

## 2015-01-17 NOTE — Progress Notes (Signed)
Loop recorder 

## 2015-01-18 DIAGNOSIS — G894 Chronic pain syndrome: Secondary | ICD-10-CM | POA: Diagnosis not present

## 2015-01-18 DIAGNOSIS — M4806 Spinal stenosis, lumbar region: Secondary | ICD-10-CM | POA: Diagnosis not present

## 2015-01-18 DIAGNOSIS — Z981 Arthrodesis status: Secondary | ICD-10-CM | POA: Diagnosis not present

## 2015-01-18 DIAGNOSIS — M791 Myalgia: Secondary | ICD-10-CM | POA: Diagnosis not present

## 2015-01-25 ENCOUNTER — Ambulatory Visit (INDEPENDENT_AMBULATORY_CARE_PROVIDER_SITE_OTHER): Payer: Medicare Other | Admitting: *Deleted

## 2015-01-25 DIAGNOSIS — Z5181 Encounter for therapeutic drug level monitoring: Secondary | ICD-10-CM | POA: Diagnosis not present

## 2015-01-25 LAB — POCT INR: INR: 2.2

## 2015-01-26 ENCOUNTER — Telehealth: Payer: Self-pay | Admitting: *Deleted

## 2015-01-26 NOTE — Telephone Encounter (Signed)
Spoke with pt's wife and she states has had call from [facility] at [place] and she states change in time of dental procedure to June 6th and they will check his INR before the procedure. This nurse called and spoke with Anderson Malta the hygienist and she states that is correct will have procedure on June 6th and they will check INR prior to procedure. Spoke with pt's wife and asked that she call us to let us know how things are and when we need to recheck INR . Pt's wife states they may see if can have his INR and coumadin followed there at the [place] and she states will call us regarding this

## 2015-01-27 LAB — CUP PACEART REMOTE DEVICE CHECK: MDC IDC SESS DTM: 20160603123916

## 2015-01-31 ENCOUNTER — Telehealth: Payer: Self-pay | Admitting: *Deleted

## 2015-01-31 NOTE — Telephone Encounter (Signed)
Spouse called to inform us that pt had all of his dental procedures yesterday at [facility] and his INR was 1.5. Spouse states that pt didn't miss any doses, or new meds added, thus instructed her to give him an extra 1/2 tablet today and appt for follow up moved from tomorrow to Monday.

## 2015-02-06 ENCOUNTER — Ambulatory Visit (INDEPENDENT_AMBULATORY_CARE_PROVIDER_SITE_OTHER): Payer: Medicare Other | Admitting: Pharmacist

## 2015-02-06 DIAGNOSIS — I2699 Other pulmonary embolism without acute cor pulmonale: Secondary | ICD-10-CM

## 2015-02-06 DIAGNOSIS — I6340 Cerebral infarction due to embolism of unspecified cerebral artery: Secondary | ICD-10-CM | POA: Diagnosis not present

## 2015-02-06 DIAGNOSIS — Z5181 Encounter for therapeutic drug level monitoring: Secondary | ICD-10-CM | POA: Diagnosis not present

## 2015-02-06 DIAGNOSIS — I639 Cerebral infarction, unspecified: Secondary | ICD-10-CM

## 2015-02-06 LAB — POCT INR: INR: 2.4

## 2015-02-08 ENCOUNTER — Encounter: Payer: Self-pay | Admitting: Cardiology

## 2015-02-11 ENCOUNTER — Emergency Department (HOSPITAL_COMMUNITY): Payer: Medicare Other

## 2015-02-11 ENCOUNTER — Inpatient Hospital Stay (HOSPITAL_COMMUNITY)
Admission: EM | Admit: 2015-02-11 | Discharge: 2015-02-13 | DRG: 065 | Disposition: A | Discharge status: Home Health | Payer: Medicare Other | Attending: Family Medicine | Admitting: Family Medicine

## 2015-02-11 ENCOUNTER — Encounter (HOSPITAL_COMMUNITY): Payer: Self-pay | Admitting: Neurology

## 2015-02-11 DIAGNOSIS — I639 Cerebral infarction, unspecified: Secondary | ICD-10-CM | POA: Diagnosis not present

## 2015-02-11 DIAGNOSIS — I672 Cerebral atherosclerosis: Secondary | ICD-10-CM | POA: Diagnosis not present

## 2015-02-11 DIAGNOSIS — Z77090 Contact with and (suspected) exposure to asbestos: Secondary | ICD-10-CM | POA: Diagnosis present

## 2015-02-11 DIAGNOSIS — E785 Hyperlipidemia, unspecified: Secondary | ICD-10-CM | POA: Diagnosis present

## 2015-02-11 DIAGNOSIS — F329 Major depressive disorder, single episode, unspecified: Secondary | ICD-10-CM | POA: Diagnosis present

## 2015-02-11 DIAGNOSIS — Z86711 Personal history of pulmonary embolism: Secondary | ICD-10-CM | POA: Insufficient documentation

## 2015-02-11 DIAGNOSIS — Z881 Allergy status to other antibiotic agents status: Secondary | ICD-10-CM | POA: Diagnosis not present

## 2015-02-11 DIAGNOSIS — I63521 Cerebral infarction due to unspecified occlusion or stenosis of right anterior cerebral artery: Secondary | ICD-10-CM | POA: Diagnosis not present

## 2015-02-11 DIAGNOSIS — I10 Essential (primary) hypertension: Secondary | ICD-10-CM | POA: Diagnosis not present

## 2015-02-11 DIAGNOSIS — E119 Type 2 diabetes mellitus without complications: Secondary | ICD-10-CM | POA: Diagnosis present

## 2015-02-11 DIAGNOSIS — J9610 Chronic respiratory failure, unspecified whether with hypoxia or hypercapnia: Secondary | ICD-10-CM | POA: Diagnosis present

## 2015-02-11 DIAGNOSIS — I4892 Unspecified atrial flutter: Secondary | ICD-10-CM | POA: Diagnosis present

## 2015-02-11 DIAGNOSIS — Z6834 Body mass index (BMI) 34.0-34.9, adult: Secondary | ICD-10-CM | POA: Diagnosis not present

## 2015-02-11 DIAGNOSIS — G451 Carotid artery syndrome (hemispheric): Secondary | ICD-10-CM

## 2015-02-11 DIAGNOSIS — I2510 Atherosclerotic heart disease of native coronary artery without angina pectoris: Secondary | ICD-10-CM | POA: Diagnosis present

## 2015-02-11 DIAGNOSIS — E669 Obesity, unspecified: Secondary | ICD-10-CM | POA: Diagnosis present

## 2015-02-11 DIAGNOSIS — R531 Weakness: Secondary | ICD-10-CM | POA: Diagnosis not present

## 2015-02-11 DIAGNOSIS — K219 Gastro-esophageal reflux disease without esophagitis: Secondary | ICD-10-CM | POA: Diagnosis present

## 2015-02-11 DIAGNOSIS — R471 Dysarthria and anarthria: Secondary | ICD-10-CM | POA: Diagnosis not present

## 2015-02-11 DIAGNOSIS — Z7901 Long term (current) use of anticoagulants: Secondary | ICD-10-CM

## 2015-02-11 DIAGNOSIS — H9190 Unspecified hearing loss, unspecified ear: Secondary | ICD-10-CM | POA: Diagnosis present

## 2015-02-11 DIAGNOSIS — M1990 Unspecified osteoarthritis, unspecified site: Secondary | ICD-10-CM | POA: Diagnosis present

## 2015-02-11 DIAGNOSIS — I63421 Cerebral infarction due to embolism of right anterior cerebral artery: Principal | ICD-10-CM | POA: Diagnosis present

## 2015-02-11 DIAGNOSIS — Z888 Allergy status to other drugs, medicaments and biological substances status: Secondary | ICD-10-CM | POA: Diagnosis not present

## 2015-02-11 DIAGNOSIS — Z9981 Dependence on supplemental oxygen: Secondary | ICD-10-CM | POA: Diagnosis not present

## 2015-02-11 DIAGNOSIS — G40909 Epilepsy, unspecified, not intractable, without status epilepticus: Secondary | ICD-10-CM | POA: Diagnosis present

## 2015-02-11 DIAGNOSIS — G459 Transient cerebral ischemic attack, unspecified: Secondary | ICD-10-CM

## 2015-02-11 DIAGNOSIS — Z951 Presence of aortocoronary bypass graft: Secondary | ICD-10-CM | POA: Diagnosis not present

## 2015-02-11 DIAGNOSIS — Z79899 Other long term (current) drug therapy: Secondary | ICD-10-CM

## 2015-02-11 DIAGNOSIS — R404 Transient alteration of awareness: Secondary | ICD-10-CM | POA: Diagnosis not present

## 2015-02-11 DIAGNOSIS — Z823 Family history of stroke: Secondary | ICD-10-CM | POA: Diagnosis not present

## 2015-02-11 DIAGNOSIS — I6350 Cerebral infarction due to unspecified occlusion or stenosis of unspecified cerebral artery: Secondary | ICD-10-CM | POA: Diagnosis not present

## 2015-02-11 DIAGNOSIS — R4781 Slurred speech: Secondary | ICD-10-CM | POA: Diagnosis not present

## 2015-02-11 DIAGNOSIS — R42 Dizziness and giddiness: Secondary | ICD-10-CM | POA: Diagnosis not present

## 2015-02-11 DIAGNOSIS — G4733 Obstructive sleep apnea (adult) (pediatric): Secondary | ICD-10-CM | POA: Diagnosis present

## 2015-02-11 LAB — DIFFERENTIAL
BASOS ABS: 0 10*3/uL (ref 0.0–0.1)
Basophils Relative: 0 % (ref 0–1)
EOS ABS: 0.1 10*3/uL (ref 0.0–0.7)
EOS PCT: 1 % (ref 0–5)
Lymphocytes Relative: 16 % (ref 12–46)
Lymphs Abs: 1.2 10*3/uL (ref 0.7–4.0)
MONO ABS: 1.1 10*3/uL — AB (ref 0.1–1.0)
Monocytes Relative: 14 % — ABNORMAL HIGH (ref 3–12)
Neutro Abs: 5.1 10*3/uL (ref 1.7–7.7)
Neutrophils Relative %: 69 % (ref 43–77)

## 2015-02-11 LAB — I-STAT CHEM 8, ED
BUN: 19 mg/dL (ref 6–20)
CALCIUM ION: 1.14 mmol/L (ref 1.13–1.30)
CREATININE: 0.9 mg/dL (ref 0.61–1.24)
Chloride: 100 mmol/L — ABNORMAL LOW (ref 101–111)
Glucose, Bld: 115 mg/dL — ABNORMAL HIGH (ref 65–99)
HCT: 39 % (ref 39.0–52.0)
HEMOGLOBIN: 13.3 g/dL (ref 13.0–17.0)
Potassium: 3.9 mmol/L (ref 3.5–5.1)
Sodium: 138 mmol/L (ref 135–145)
TCO2: 25 mmol/L (ref 0–100)

## 2015-02-11 LAB — COMPREHENSIVE METABOLIC PANEL
ALK PHOS: 80 U/L (ref 38–126)
ALT: 22 U/L (ref 17–63)
AST: 23 U/L (ref 15–41)
Albumin: 3.7 g/dL (ref 3.5–5.0)
Anion gap: 9 (ref 5–15)
BUN: 16 mg/dL (ref 6–20)
CHLORIDE: 101 mmol/L (ref 101–111)
CO2: 26 mmol/L (ref 22–32)
Calcium: 9 mg/dL (ref 8.9–10.3)
Creatinine, Ser: 0.89 mg/dL (ref 0.61–1.24)
GLUCOSE: 117 mg/dL — AB (ref 65–99)
POTASSIUM: 3.9 mmol/L (ref 3.5–5.1)
Sodium: 136 mmol/L (ref 135–145)
Total Bilirubin: 0.5 mg/dL (ref 0.3–1.2)
Total Protein: 7.4 g/dL (ref 6.5–8.1)

## 2015-02-11 LAB — URINALYSIS, ROUTINE W REFLEX MICROSCOPIC
Bilirubin Urine: NEGATIVE
Glucose, UA: NEGATIVE mg/dL
Hgb urine dipstick: NEGATIVE
KETONES UR: NEGATIVE mg/dL
LEUKOCYTES UA: NEGATIVE
Nitrite: NEGATIVE
PROTEIN: NEGATIVE mg/dL
Specific Gravity, Urine: 1.009 (ref 1.005–1.030)
Urobilinogen, UA: 0.2 mg/dL (ref 0.0–1.0)
pH: 5.5 (ref 5.0–8.0)

## 2015-02-11 LAB — CBC
HEMATOCRIT: 36.7 % — AB (ref 39.0–52.0)
Hemoglobin: 12.3 g/dL — ABNORMAL LOW (ref 13.0–17.0)
MCH: 30.5 pg (ref 26.0–34.0)
MCHC: 33.5 g/dL (ref 30.0–36.0)
MCV: 91.1 fL (ref 78.0–100.0)
PLATELETS: 224 10*3/uL (ref 150–400)
RBC: 4.03 MIL/uL — ABNORMAL LOW (ref 4.22–5.81)
RDW: 13.2 % (ref 11.5–15.5)
WBC: 7.4 10*3/uL (ref 4.0–10.5)

## 2015-02-11 LAB — GLUCOSE, CAPILLARY
Glucose-Capillary: 127 mg/dL — ABNORMAL HIGH (ref 65–99)
Glucose-Capillary: 135 mg/dL — ABNORMAL HIGH (ref 65–99)

## 2015-02-11 LAB — I-STAT TROPONIN, ED: Troponin i, poc: 0 ng/mL (ref 0.00–0.08)

## 2015-02-11 LAB — PROTIME-INR
INR: 2.07 — ABNORMAL HIGH (ref 0.00–1.49)
PROTHROMBIN TIME: 23.2 s — AB (ref 11.6–15.2)

## 2015-02-11 LAB — APTT: aPTT: 36 seconds (ref 24–37)

## 2015-02-11 LAB — CBG MONITORING, ED: GLUCOSE-CAPILLARY: 108 mg/dL — AB (ref 65–99)

## 2015-02-11 MED ORDER — STROKE: EARLY STAGES OF RECOVERY BOOK
Freq: Once | Status: AC
  Administered 2015-02-11: 15:00:00

## 2015-02-11 MED ORDER — METOPROLOL TARTRATE 25 MG PO TABS
25.0000 mg | ORAL_TABLET | Freq: Two times a day (BID) | ORAL | Status: DC
  Administered 2015-02-11 – 2015-02-13 (×4): 25 mg via ORAL
  Filled 2015-02-11 (×5): qty 1

## 2015-02-11 MED ORDER — PREGABALIN 50 MG PO CAPS
50.0000 mg | ORAL_CAPSULE | Freq: Three times a day (TID) | ORAL | Status: DC
  Administered 2015-02-11 – 2015-02-13 (×7): 50 mg via ORAL
  Filled 2015-02-11 (×7): qty 1

## 2015-02-11 MED ORDER — FENTANYL CITRATE (PF) 100 MCG/2ML IJ SOLN
100.0000 ug | Freq: Once | INTRAMUSCULAR | Status: AC
  Administered 2015-02-11: 100 ug via INTRAVENOUS
  Filled 2015-02-11: qty 2

## 2015-02-11 MED ORDER — ATORVASTATIN CALCIUM 80 MG PO TABS
80.0000 mg | ORAL_TABLET | Freq: Every day | ORAL | Status: DC
  Administered 2015-02-11 – 2015-02-12 (×2): 80 mg via ORAL
  Filled 2015-02-11 (×2): qty 1

## 2015-02-11 MED ORDER — FENTANYL CITRATE (PF) 100 MCG/2ML IJ SOLN: 50.0000 ug | Freq: Once | INTRAMUSCULAR | Status: DC

## 2015-02-11 MED ORDER — TRAZODONE HCL 50 MG PO TABS
150.0000 mg | ORAL_TABLET | Freq: Every day | ORAL | Status: DC
  Administered 2015-02-11 – 2015-02-12 (×2): 150 mg via ORAL
  Filled 2015-02-11 (×4): qty 1

## 2015-02-11 MED ORDER — ASPIRIN 325 MG PO TABS
325.0000 mg | ORAL_TABLET | Freq: Every day | ORAL | Status: DC
  Administered 2015-02-11 – 2015-02-13 (×3): 325 mg via ORAL
  Filled 2015-02-11 (×3): qty 1

## 2015-02-11 MED ORDER — ENOXAPARIN SODIUM 40 MG/0.4ML ~~LOC~~ SOLN
40.0000 mg | SUBCUTANEOUS | Status: DC
Start: 2015-02-11 — End: 2015-02-11

## 2015-02-11 MED ORDER — PANTOPRAZOLE SODIUM 40 MG PO TBEC
40.0000 mg | DELAYED_RELEASE_TABLET | Freq: Two times a day (BID) | ORAL | Status: DC
  Administered 2015-02-11 – 2015-02-13 (×5): 40 mg via ORAL
  Filled 2015-02-11 (×5): qty 1

## 2015-02-11 MED ORDER — METHOCARBAMOL 750 MG PO TABS
750.0000 mg | ORAL_TABLET | Freq: Three times a day (TID) | ORAL | Status: DC | PRN
  Administered 2015-02-12: 750 mg via ORAL
  Filled 2015-02-11: qty 1

## 2015-02-11 MED ORDER — FLUOXETINE HCL 20 MG PO CAPS
30.0000 mg | ORAL_CAPSULE | Freq: Every morning | ORAL | Status: DC
  Administered 2015-02-13: 30 mg via ORAL
  Filled 2015-02-11: qty 1

## 2015-02-11 MED ORDER — SENNOSIDES-DOCUSATE SODIUM 8.6-50 MG PO TABS: 1.0000 | ORAL_TABLET | Freq: Every evening | ORAL | Status: DC | PRN

## 2015-02-11 MED ORDER — LINAGLIPTIN 5 MG PO TABS
5.0000 mg | ORAL_TABLET | Freq: Every day | ORAL | Status: DC
  Administered 2015-02-12 – 2015-02-13 (×2): 5 mg via ORAL
  Filled 2015-02-11 (×2): qty 1

## 2015-02-11 NOTE — ED Notes (Signed)
Patient transported to MRI 

## 2015-02-11 NOTE — H&P (Signed)
[facility] Admission History and Physical Service Pager: [phone]  Patient name: [name] Medical record number: [id number] Date of birth: 26-Feb-1946 Age: 69 y.o. Gender: male  Primary Care Provider: [name], MD Consultants: Neurology  Code Status: Full per discussion on admission (pt would not like prolonged time on machines, however)  Chief Complaint: Dysarthria  Assessment and Plan: [name] is a 69 y.o. male presenting with dysarthria. PMH is significant for CVA, CAD, DM2, asbestosis, seizure disorder, HTN, HLD, OSA, chronic back pain, EtOH abuse, and dysphage.   CVA: Patient presenting with dysarthria and generalized weakness. He has a h/o of CVAs x 3. CT of head was negative, however MRI/MRA revealed Chronic right ACA infarct in the right frontal lobe with a small area of acute infarct in the right frontal cortex over the convexity compatible with extension of infarction. Oddly, the R anterior cerebral artery was patent, therefore unsure if this was secondary to embolus from atrial flutter.  - Admit to telemetry, attending, Dr. Andria Frames  - Neurology consulted, appreciate recs -- hold coumadin, 338m ASA - Will not repeat echo as last was 4/16 and revealed EF 55-60% with grade 1 DD and a PA peak pressure of 34 - Will obtain carotid dopplers, as last was in 2015 - Risk stratification labs in the AM - Atorvastatin 875m- ASA 32517m- NPO until passes bedside swallow - PT/OT/SLP  CAD H/o CABG 2005, stress test 10/26/12 showing low risk with small area of decreased uptake at base of inferior wall; EF 62%. Echo in 4/16 and revealed EF 55-60% with grade 1 DD and a PA peak pressure of 34 - Continue aspirin 325 - Continue metoprolol 55m15mD (written as 50 BID, however pt taking differently). Titrate as needed  - Holding lasix 20mg36mPRN (pt took 40mg 57mhis AM, as he does every morning) - Hold urocit-K while not taking lasix  T2DM:  - Januvia  100mg d52m (will switch to tradjenta, formulary) - monitor CBGs - continue Lyrica  HLD -Continue Lipitor 80mg  -6mat lipid panel in AM to assess compliance/response.   H/O alcohol abuse: In remission for several years. Patient continues to have a problem with prescribed narcotics, per his wife, who locks them away. - Try to avoid narcotics in this patient   GERD Stable.  - Continue protonix  History of PE: Noted on CTA on 05/19/14. - has been on warfarin, not always therapeutic  OSA:  - BiPAP qHS  Depression: Sees Randy Readling in psychiatry.  - Continue Prozac - continue trazodone qHS  FEN/GI: NPO until passes bedside swallow, IV saline lock Prophylaxis: SCDs for now  Disposition: Admit to telemetry, attending Dr. Hensel  Andria Framesy of Present Illness: Most of history is given by the wife at the bedside as his history is limited by difficulty with speech/sleepy. [name] y.o. 69le presenting with weakness and slurred speech. Last known normal was around 11pm last night. Wife says he was not complaining of anything other than back pain yesterday (which is a chronic complaint). Around 3:45am wife awoke to the noise of pt falling, when she came she found him on his buttocks so believed he had fallen to his knees then buttocks. Pt denies hitting head. Wife called EMS and they came to evaluate him, wife felt like he was having some slurred speech but EMS attributed to dry mouth from the bipap. Ambulance left and wife felt he had  persistent slurred speech, with worsening weakness to the point that wife was using a belt to try and help him walk. Wife then called a second ambulance to bring him to the ED (this was several hours after the first ambulance).  At baseline he is able to ambulate, he does have a rolling walker with seat and recently got a scooter. Wife states he normally sleeps most of the day. He has had a history of right sided stroke in March 2015 with some residual  L LE weakness and short term memory deficits.   In the ED, given concerns for stroke, a CT was obtained that was normal. A MRI was performed and neurology was consulted.  Review Of Systems: Per HPI with the following additions: no CP, no SOB, pain in his back (chronic) Otherwise 12 point review of systems was performed and was unremarkable.  Patient Active Problem List   Diagnosis Date Noted  . Lower extremity weakness 01/08/2015  . Status post placement of implantable loop recorder 08/01/2014  . Chronic respiratory failure, unspecified whether with hypoxia or hypercapnia 05/19/2014  . Pulmonary embolism 05/19/2014  . Hemiparesis and speech and language deficit as late effects of stroke 04/19/2014  . Ureteral colic 16/05/9603  . Left ureteral stone 04/15/2014  . Obstructive sleep apnea 12/07/2013  . Obesity, unspecified 12/07/2013  . Embolic stroke 54/04/8118  . Cough 06/09/2013  . S/P lumbar spine operation 06/02/2013  . Nerve root pain 06/02/2013  . Pleural plaque consistent with asbestos exposure by CT 10/2012 02/17/2013  . Renal failure 02/13/2013  . Dyspnea 02/11/2013  . Dysphagia, unspecified(787.20) 05/12/2012  . Seizure disorder 04/04/2012  . CAD (coronary artery disease) 06/13/2011  . HTN (hypertension) 06/13/2011  . Diabetes mellitus 06/13/2011  . History of seizure disorder   . Depression   . Back pain   . Hearing loss   . Arrhythmia   . Diverticulitis   . H/O alcohol abuse   . Personal history of other diseases of circulatory system 05/29/2011   Past Medical History: Past Medical History  Diagnosis Date  . Coronary artery disease     a. Low level exercise Lex MV 3/14: low risk, EF 62%, inf defect 2/2 diaph attenuation vs artifiact, small area of scar possible, no ischemia  . Hyperlipidemia   . Hypertension   . History of seizure disorder   . History of atrial flutter   . Depression   . Back pain     persistent  . Hearing loss   . Diverticulitis   . H/O  alcohol abuse   . Diabetes mellitus     controled by diet  . GERD (gastroesophageal reflux disease)   . Seizures   . Headache(784.0)   . Arthritis     of spine  . Sleep apnea     does not use CPAP  . Hx of echocardiogram     Echo 7/14:  Mod LVH, EF 55-60%, Gr 1 DD, mild MR, PASP 36  . AKI (acute kidney injury)     a. 6/14: resolved after d/c ARB;  b. RA U/S 7/14: no RA stenosis  . Atrial flutter   . Stroke   . Shortness of breath   . Renal stone 04/15/2014   Past Surgical History: Past Surgical History  Procedure Laterality Date  . Partial colectomy      for diverticuli  . Orchiectomy    . Umbilical hernia repair    . Cervical fusion    . Coronary artery bypass  graft  2005    LIMA graft to LAD,saphenous vein graft to diag.,circumflex, marginal,and to the RCA  . Cardiac catheterization    . Appendectomy    . Esophagogastroduodenoscopy  02/10/2012    Procedure: ESOPHAGOGASTRODUODENOSCOPY (EGD);  Surgeon: Cleotis Nipper, MD;  Location: [facility] ENDOSCOPY;  Service: Endoscopy;  Laterality: N/A;  . Foreign body removal  02/10/2012    Procedure: FOREIGN BODY REMOVAL;  Surgeon: Cleotis Nipper, MD;  Location: [place];  Service: Endoscopy;  Laterality: N/A;  . Back surgery    . Esophagogastroduodenoscopy  06/29/2012    Procedure: ESOPHAGOGASTRODUODENOSCOPY (EGD);  Surgeon: Winfield Cunas., MD;  Location: [facility] ENDOS[facility];  Service: Endoscopy;  Laterality: N/A;  . Foreign body removal  06/29/2012    Procedure: FOREIGN BODY REMOVAL;  Surgeon: Winfield Cunas., MD;  Location: [facility] ENDOSCOPY;  Service: Endoscopy;  Laterality: N/A;  . Savory dilation  06/29/2012    Procedure: SAVORY DILATION;  Surgeon: Winfield Cunas., MD;  Location: [facility] ENDOSCOPY;  Service: Endoscopy;  Laterality: N/A;  . Tee without cardioversion N/A 11/17/2013    Procedure: TRANSESOPHAGEAL ECHOCARDIOGRAM (TEE);  Surgeon: Larey Dresser, MD;  Location: [place];  Service: Cardiovascular;  Laterality: N/A;  .  Cystoscopy/retrograde/ureteroscopy/stone extraction with basket Left 04/16/2014    Procedure: CYSTOSCOPY/RETROGRADE/LEFT URETEROSCOPY/STONE EXTRACTION WITH BASKET AND LASER LITHOTRIPSY;  Surgeon: Malka So, MD;  Location: [facility] ORS;  Service: Urology;  Laterality: Left;  With STENT  . Left heart catheterization with coronary angiogram N/A 04/02/2012    Procedure: LEFT HEART CATHETERIZATION WITH CORONARY ANGIOGRAM;  Surgeon: Sinclair Grooms, MD;  Location: [facility] CATH LAB;  Service: Cardiovascular;  Laterality: N/A;  . Loop recorder implant N/A 11/17/2013    Procedure: LOOP RECORDER IMPLANT;  Surgeon: Deboraha Sprang, MD;  Location: [facility] CATH LAB;  Service: Cardiovascular;  Laterality: N/A;   Social History: History  Substance Use Topics  . Smoking status: Never Smoker   . Smokeless tobacco: Never Used  . Alcohol Use: No     Comment: rare beer   Additional social history: used to drink alcohol regularly, last drink 3 years ago, wife admits they still have to lock up narcotics Please also refer to relevant sections of EMR.  Family History: Family History  Problem Relation Age of Onset  . Emphysema Mother     was a smoker  . Asthma Mother   . Heart disease Father   . Prostate cancer Paternal Grandfather   . Pancreatic cancer Paternal Uncle   . Heart attack Neg Hx   . Stroke Paternal Grandmother   . Hypertension Mother    Allergies and Medications: Allergies  Allergen Reactions  . Cephalexin     Unknown  . Doxycycline     Unknown  . Keflex [Cephalexin]   . Lac Bovis Other (See Comments)    lactose intolerant  . Pentazocine Lactate     He passed out- he had a seizure.  This occurred around 2000  . Talwin [Pentazocine]    No current facility-administered medications on file prior to encounter.   Current Outpatient Prescriptions on File Prior to Encounter  Medication Sig Dispense Refill  . atorvastatin (LIPITOR) 80 MG tablet Take 1 tablet (80 mg total) by mouth daily. (Patient taking  differently: Take 80 mg by mouth at bedtime. ) 30 tablet 11  . furosemide (LASIX) 20 MG tablet TAKE 1-2 TABLETS BY MOUTH EVERY DAY AS NEEDED (Patient taking differently: 2 DAILY in the morning (4am)) 60  tablet 3  . methocarbamol (ROBAXIN) 750 MG tablet Take 750 mg by mouth every 8 (eight) hours as needed for muscle spasms.     . metoprolol (LOPRESSOR) 50 MG tablet Take 1 tablet (50 mg total) by mouth 2 (two) times daily. (Patient taking differently: Take 25 mg by mouth 2 (two) times daily. ) 60 tablet 3  . pantoprazole (PROTONIX) 40 MG tablet Take 40 mg by mouth 2 (two) times daily before a meal.    . potassium citrate (UROCIT-K) 10 MEQ (1080 MG) SR tablet Take 10 mEq by mouth 3 (three) times daily with meals.     . pregabalin (LYRICA) 50 MG capsule Take 50 mg by mouth 3 (three) times daily.     . sitaGLIPtin (JANUVIA) 100 MG tablet Take 100 mg by mouth every morning.     . traZODone (DESYREL) 150 MG tablet Take 150 mg by mouth at bedtime.    Marland Kitchen warfarin (COUMADIN) 5 MG tablet Take as directed by anticoagulation clinic (Patient taking differently: Take 5-10 mg by mouth every evening. Take one tablet (44m) on Tuesdays, Fridays, and Sundays. On all other days of the week, take two tablets (168m.) 45 tablet 3  . glucose blood (ONE TOUCH ULTRA TEST) test strip     . nitroGLYCERIN (NITROSTAT) 0.4 MG SL tablet Place 1 tablet (0.4 mg total) under the tongue every 5 (five) minutes as needed. For chest pain 25 tablet 0  . ONETOUCH DELICA LANCETS FINE MISC       Objective: BP 113/91 mmHg  Pulse 72  Temp(Src) 98.2 F (36.8 C) (Rectal)  Resp 12  SpO2 98% Exam: General: Lying in bed sleeping with his mouth open, in NAD. Initially falling asleep intermittently Eyes: Conjunctivae non-injected. PERRL.. Marland Kitcheno drainage noted ENTM: MMM, oropharynx clear. No nasal drainage.  Neck: Supple Cardiovascular: RRR, No murmurs, rubs or gallops. Trace pitting edema Respiratory: CTAB without wheezing, rhonchi, or  crackles. No increased WOB. Abdomen: +BS, soft, ND/NT MSK: No gross deformities noted  Skin: No rashes noted. Numerous well healed scares over chest and abdomen from surgeries. Neuro: A&Ox 3. Answers questions and follow commands. Slightly slurred speech, however no word finding problems. PERRL. EOMI. Possible slight left sided facial droop noted on smile. Sensation intact over V1-V3 distribution. Tongue and uvula midline. Shoulder shrug symmetric. Normal bulk and tone. Slight decreased strength in the R UE when compared to the L. 5/5 strength in the LEs b/l. Sensation intact to light touch over upper and lower extremities. Slowed FNF with some end point dysmetria.  Psych: appropriate mood and affect  Labs and Imaging: CBC BMET   Recent Labs Lab 02/11/15 0905 02/11/15 0913  WBC 7.4  --   HGB 12.3* 13.3  HCT 36.7* 39.0  PLT 224  --     Recent Labs Lab 02/11/15 0905 02/11/15 0913  NA 136 138  K 3.9 3.9  CL 101 100*  CO2 26  --   BUN 16 19  CREATININE 0.89 0.90  GLUCOSE 117* 115*  CALCIUM 9.0  --     ISTAT trop 0  INR 2.07  U/A: negative nitrite and LE  EKG: NSR, HR 73. Minimal ST elevation in III alone. QTc 482, no significant change from previous  CT Head:  1. No acute intracranial pathology seen on CT. 2. Mild cortical volume loss and scattered small vessel ischemic microangiopathy. 3. Chronic infarct at the high right frontoparietal region, with associated encephalomalacia. Chronic infarct extends inferiorly along the medial right frontal  lobe. 4. Small chronic lacunar infarcts at the basal ganglia bilaterally.  MRI/MRA head: Chronic right ACA infarct in the right frontal lobe. There is now a small area of acute infarct in the right frontal cortex over the convexity compatible with extension of infarction. Atrophy and chronic microvascular ischemic changes. Chronic infarct left pons has developed since 2015. Severe stenosis left posterior cerebral artery is unchanged  from the prior study. Right anterior cerebral artery is widely patent.  Archie Patten, MD 02/11/2015, 10:36 AM PGY-1, [place] Intern pager: [phone], text pages welcome  I have seen and examined the patient. I have read and agree with the above note. My changes are noted in blue.  Tawanna Sat, MD 02/11/2015, 7:12 PM PGY-2, [place] Intern Pager: [phone], text pages welcome

## 2015-02-11 NOTE — ED Provider Notes (Addendum)
CSN: [id number]     Arrival date & time 02/11/15  2025 History   First MD Initiated Contact with Patient 02/11/15 0827     Chief Complaint  Patient presents with  . Stroke Symptoms      HPI Patient was last seen normal last night at 11 PM.  He woke this morning and had dysarthric speech.  He felt weak all over.  Patient is on Coumadin for history of atrial flutter and pulmonary embolism.  He has had a history of coronary artery disease as well as stroke.  His last acute stroke was then sprayed of 2015.  He is chronically on home oxygen as well.  He states he was in his normal state of health yesterday.  He agrees that his speech is abnormal.  He denies unilateral arm or leg weakness.  His wife reports that he was somewhat restless last night.  Initially EMS came out and the patient refused transport at around 4 AM.  Wife is not sure if his speech was slightly slurred at that time.  She reports she had a dry mouth and blames some of his speech on the dry mouth that time.    Past Medical History  Diagnosis Date  . Coronary artery disease     a. Low level exercise Lex MV 3/14: low risk, EF 62%, inf defect 2/2 diaph attenuation vs artifiact, small area of scar possible, no ischemia  . Hyperlipidemia   . Hypertension   . History of seizure disorder   . History of atrial flutter   . Depression   . Back pain     persistent  . Hearing loss   . Diverticulitis   . H/O alcohol abuse   . Diabetes mellitus     controled by diet  . GERD (gastroesophageal reflux disease)   . Seizures   . Headache(784.0)   . Arthritis     of spine  . Sleep apnea     does not use CPAP  . Hx of echocardiogram     Echo 7/14:  Mod LVH, EF 55-60%, Gr 1 DD, mild MR, PASP 36  . AKI (acute kidney injury)     a. 6/14: resolved after d/c ARB;  b. RA U/S 7/14: no RA stenosis  . Atrial flutter   . Stroke   . Shortness of breath   . Renal stone 04/15/2014   Past Surgical History  Procedure Laterality Date  .  Partial colectomy      for diverticuli  . Orchiectomy    . Umbilical hernia repair    . Cervical fusion    . Coronary artery bypass graft  2005    LIMA graft to LAD,saphenous vein graft to diag.,circumflex, marginal,and to the RCA  . Cardiac catheterization    . Appendectomy    . Esophagogastroduodenoscopy  02/10/2012    Procedure: ESOPHAGOGASTRODUODENOSCOPY (EGD);  Surgeon: Cleotis Nipper, MD;  Location: [facility] ENDOSCOPY;  Service: Endoscopy;  Laterality: N/A;  . Foreign body removal  02/10/2012    Procedure: FOREIGN BODY REMOVAL;  Surgeon: Cleotis Nipper, MD;  Location: [place];  Service: Endoscopy;  Laterality: N/A;  . Back surgery    . Esophagogastroduodenoscopy  06/29/2012    Procedure: ESOPHAGOGASTRODUODENOSCOPY (EGD);  Surgeon: Winfield Cunas., MD;  Location: [facility] ENDOS[facility];  Service: Endoscopy;  Laterality: N/A;  . Foreign body removal  06/29/2012    Procedure: FOREIGN BODY REMOVAL;  Surgeon: Winfield Cunas., MD;  Location: [facility] ENDOSCOPY;  Service:  Endoscopy;  Laterality: N/A;  . Savory dilation  06/29/2012    Procedure: SAVORY DILATION;  Surgeon: Winfield Cunas., MD;  Location: [facility]OS[facility];  Service: Endoscopy;  Laterality: N/A;  . Tee without cardioversion N/A 11/17/2013    Procedure: TRANSESOPHAGEAL ECHOCARDIOGRAM (TEE);  Surgeon: Larey Dresser, MD;  Location: [place];  Service: Cardiovascular;  Laterality: N/A;  . Cystoscopy/retrograde/ureteroscopy/stone extraction with basket Left 04/16/2014    Procedure: CYSTOSCOPY/RETROGRADE/LEFT URETEROSCOPY/STONE EXTRACTION WITH BASKET AND LASER LITHOTRIPSY;  Surgeon: Malka So, MD;  Location: [facility] ORS;  Service: Urology;  Laterality: Left;  With STENT  . Left heart catheterization with coronary angiogram N/A 04/02/2012    Procedure: LEFT HEART CATHETERIZATION WITH CORONARY ANGIOGRAM;  Surgeon: Sinclair Grooms, MD;  Location: [facility] CATH LAB;  Service: Cardiovascular;  Laterality: N/A;  . Loop recorder implant N/A 11/17/2013     Procedure: LOOP RECORDER IMPLANT;  Surgeon: Deboraha Sprang, MD;  Location: [facility] CATH LAB;  Service: Cardiovascular;  Laterality: N/A;   Family History  Problem Relation Age of Onset  . Emphysema Mother     was a smoker  . Asthma Mother   . Heart disease Father   . Prostate cancer Paternal Grandfather   . Pancreatic cancer Paternal Uncle   . Heart attack Neg Hx   . Stroke Paternal Grandmother   . Hypertension Mother    History  Substance Use Topics  . Smoking status: Never Smoker   . Smokeless tobacco: Never Used  . Alcohol Use: No     Comment: rare beer    Review of Systems  All other systems reviewed and are negative.     Allergies  Cephalexin; Doxycycline; Keflex; Lac bovis; Pentazocine lactate; and Talwin  Home Medications   Prior to Admission medications   Medication Sig Start Date End Date Taking? Authorizing Provider  atorvastatin (LIPITOR) 80 MG tablet Take 1 tablet (80 mg total) by mouth daily. Patient taking differently: Take 80 mg by mouth at bedtime.  10/29/13  Yes Gay Filler Copland, MD  FLUoxetine (PROZAC) 10 MG capsule Take 30 mg by mouth every morning.   Yes Historical Provider, MD  furosemide (LASIX) 20 MG tablet TAKE 1-2 TABLETS BY MOUTH EVERY DAY AS NEEDED Patient taking differently: 2 DAILY in the morning (4am) 10/24/14  Yes Peter M Martinique, MD  methocarbamol (ROBAXIN) 750 MG tablet Take 750 mg by mouth every 8 (eight) hours as needed for muscle spasms.  04/10/14  Yes Historical Provider, MD  metoprolol (LOPRESSOR) 50 MG tablet Take 1 tablet (50 mg total) by mouth 2 (two) times daily. Patient taking differently: Take 25 mg by mouth 2 (two) times daily.  11/30/14  Yes Collene Gobble, MD  pantoprazole (PROTONIX) 40 MG tablet Take 40 mg by mouth 2 (two) times daily before a meal.   Yes Historical Provider, MD  potassium citrate (UROCIT-K) 10 MEQ (1080 MG) SR tablet Take 10 mEq by mouth 3 (three) times daily with meals.  07/08/14  Yes Historical Provider, MD   pregabalin (LYRICA) 50 MG capsule Take 50 mg by mouth 3 (three) times daily.  12/15/14 02/11/15 Yes Historical Provider, MD  sitaGLIPtin (JANUVIA) 100 MG tablet Take 100 mg by mouth every morning.    Yes Historical Provider, MD  traZODone (DESYREL) 150 MG tablet Take 150 mg by mouth at bedtime.   Yes Historical Provider, MD  warfarin (COUMADIN) 5 MG tablet Take as directed by anticoagulation clinic Patient taking differently: Take 5-10 mg by mouth every  evening. Take one tablet (75m) on Tuesdays, Fridays, and Sundays. On all other days of the week, take two tablets (114m. 10/07/14  Yes MiTanda RockersMD  glucose blood (ONE TOUCH ULTRA TEST) test strip  03/15/14   Historical Provider, MD  nitroGLYCERIN (NITROSTAT) 0.4 MG SL tablet Place 1 tablet (0.4 mg total) under the tongue every 5 (five) minutes as needed. For chest pain 05/12/14   Peter M JoMartiniqueMD  ONOceansideANCETS FINE MISC  03/14/14   Historical Provider, MD   BP 113/91 mmHg  Pulse 72  Temp(Src) 98.2 F (36.8 C) (Rectal)  Resp 12  SpO2 98% Physical Exam  Constitutional: He is oriented to person, place, and time. He appears well-developed and well-nourished.  HENT:  Head: Normocephalic and atraumatic.  Eyes: EOM are normal. Pupils are equal, round, and reactive to light.  Neck: Normal range of motion.  Cardiovascular: Normal rate, regular rhythm, normal heart sounds and intact distal pulses.   Pulmonary/Chest: Effort normal and breath sounds normal. No respiratory distress.  Abdominal: Soft. He exhibits no distension. There is no tenderness.  Musculoskeletal: Normal range of motion.  Neurological: He is alert and oriented to person, place, and time.  5/5 strength in major muscle groups of  bilateral upper and lower extremities.  Dysarthric speech.  No facial asymetry.   Skin: Skin is warm and dry.  Psychiatric: He has a normal mood and affect. Judgment normal.  Nursing note and vitals reviewed.   ED Course  Procedures  (including critical care time) Labs Review Labs Reviewed  PROTIME-INR - Abnormal; Notable for the following:    Prothrombin Time 23.2 (*)    INR 2.07 (*)    All other components within normal limits  CBC - Abnormal; Notable for the following:    RBC 4.03 (*)    Hemoglobin 12.3 (*)    HCT 36.7 (*)    All other components within normal limits  DIFFERENTIAL - Abnormal; Notable for the following:    Monocytes Relative 14 (*)    Monocytes Absolute 1.1 (*)    All other components within normal limits  COMPREHENSIVE METABOLIC PANEL - Abnormal; Notable for the following:    Glucose, Bld 117 (*)    All other components within normal limits  I-STAT CHEM 8, ED - Abnormal; Notable for the following:    Chloride 100 (*)    Glucose, Bld 115 (*)    All other components within normal limits  CBG MONITORING, ED - Abnormal; Notable for the following:    Glucose-Capillary 108 (*)    All other components within normal limits  APTT  URINALYSIS, ROUTINE W REFLEX MICROSCOPIC [facility]-STAT TROPOININ, ED    Imaging Review Ct Head Wo Contrast  02/11/2015   CLINICAL DATA:  Acute onset of slurred speech and generalized weakness. Patient fell to knees. Patient on Coumadin. Initial encounter.  EXAM: CT HEAD WITHOUT CONTRAST  TECHNIQUE: Contiguous axial images were obtained from the base of the skull through the vertex without intravenous contrast.  COMPARISON:  CT of the head performed 04/01/2014, and MRI/MRA of the brain performed 11/14/2013  FINDINGS: There is no evidence of acute infarction, mass lesion, or intra- or extra-axial hemorrhage on CT.  Prominence of the ventricles suggests mild cortical volume loss. Cavum septum pellucidum is noted. A chronic infarct is noted at the high right frontoparietal region, with associated encephalomalacia. Chronic infarct extends inferiorly along the medial right frontal lobe. Small chronic lacunar infarcts are seen  in the basal ganglia bilaterally. Mild  periventricular [place] matter change likely reflects small vessel ischemic microangiopathy.  The brainstem and fourth ventricle are within normal limits. No mass effect or midline shift is seen.  There is no evidence of fracture; visualized osseous structures are unremarkable in appearance. The orbits are within normal limits. The paranasal sinuses and mastoid air cells are well-aerated. No significant soft tissue abnormalities are seen.  IMPRESSION: 1. No acute intracranial pathology seen on CT. 2. Mild cortical volume loss and scattered small vessel ischemic microangiopathy. 3. Chronic infarct at the high right frontoparietal region, with associated encephalomalacia. Chronic infarct extends inferiorly along the medial right frontal lobe. 4. Small chronic lacunar infarcts at the basal ganglia bilaterally.   Electronically Signed   By: Garald Balding M.D.   On: 02/11/2015 09:41   Mr Angiogram Head Wo Contrast  02/11/2015   CLINICAL DATA:  Stroke symptoms. Dysarthric speech. On Coumadin for atrial flutter and PE  EXAM: MRI HEAD WITHOUT CONTRAST  MRA HEAD WITHOUT CONTRAST  TECHNIQUE: Multiplanar, multiecho pulse sequences of the brain and surrounding structures were obtained without intravenous contrast. Angiographic images of the head were obtained using MRA technique without contrast.  COMPARISON:  MRI 11/14/2013  FINDINGS: MRI HEAD FINDINGS  Acute infarct in the high right frontal lobe over the convexity. The prior MRI demonstrated acute infarct in this territory and there appears to be extension of this infarct which is in the right anterior cerebral artery territory. No other area of acute infarct.  Ventricular enlargement is stable and consistent with atrophy. Chronic microvascular ischemic change in the E[place]on matter. Chronic lacunar infarction in the left pons has developed since the prior study.  Negative for mass or edema.  No shift of the midline structures.  Mild chronic hemorrhage in the chronic right  frontal lobe infarct.  MRA HEAD FINDINGS  Both vertebral arteries patent to the basilar. PICA patent bilaterally. Basilar widely patent. Severe stenosis left posterior cerebral artery unchanged from the prior study. Mild irregularity right posterior cerebral artery  Atherosclerotic irregularity in the cavernous carotid bilaterally without significant stenosis. Anterior and middle cerebral arteries widely patent without significant stenosis.  Negative for cerebral aneurysm  IMPRESSION: Chronic right ACA infarct in the right frontal lobe. There is now a small area of acute infarct in the right frontal cortex over the convexity compatible with extension of infarction.  Atrophy and chronic microvascular ischemic changes. Chronic infarct left pons has developed since 2015.  Severe stenosis left posterior cerebral artery is unchanged from the prior study. Right anterior cerebral artery is widely patent.   Electronically Signed   By: Franchot Gallo M.D.   On: 02/11/2015 12:58   Mr Brain Wo Contrast  02/11/2015   CLINICAL DATA:  Stroke symptoms. Dysarthric speech. On Coumadin for atrial flutter and PE  EXAM: MRI HEAD WITHOUT CONTRAST  MRA HEAD WITHOUT CONTRAST  TECHNIQUE: Multiplanar, multiecho pulse sequences of the brain and surrounding structures were obtained without intravenous contrast. Angiographic images of the head were obtained using MRA technique without contrast.  COMPARISON:  MRI 11/14/2013  FINDINGS: MRI HEAD FINDINGS  Acute infarct in the high right frontal lobe over the convexity. The prior MRI demonstrated acute infarct in this territory and there appears to be extension of this infarct which is in the right anterior cerebral artery territory. No other area of acute infarct.  Ventricular enlargement is stable and consistent with atrophy. Chronic microvascular ischemic change in the Liuzzi matter. Chronic lacunar infarction in  the left pons has developed since the prior study.  Negative for mass or edema.   No shift of the midline structures.  Mild chronic hemorrhage in the chronic right frontal lobe infarct.  MRA HEAD FINDINGS  Both vertebral arteries patent to the basilar. PICA patent bilaterally. Basilar widely patent. Severe stenosis left posterior cerebral artery unchanged from the prior study. Mild irregularity right posterior cerebral artery  Atherosclerotic irregularity in the cavernous carotid bilaterally without significant stenosis. Anterior and middle cerebral arteries widely patent without significant stenosis.  Negative for cerebral aneurysm  IMPRESSION: Chronic right ACA infarct in the right frontal lobe. There is now a small area of acute infarct in the right frontal cortex over the convexity compatible with extension of infarction.  Atrophy and chronic microvascular ischemic changes. Chronic infarct left pons has developed since 2015.  Severe stenosis left posterior cerebral artery is unchanged from the prior study. Right anterior cerebral artery is widely patent.   Electronically Signed   By: Franchot Gallo M.D.   On: 02/11/2015 12:58  I personally reviewed the imaging tests through PACS system I reviewed available ER/hospitalization records through the EMR   ECG interpretation   Date: 02/11/2015  Rate: 73  Rhythm: normal sinus rhythm  QRS Axis: normal  Intervals: normal  ST/T Wave abnormalities: normal  Conduction Disutrbances: none  Narrative Interpretation:   Old EKG Reviewed: No significant changes noted     MDM   Final diagnoses:  Acute ischemic stroke   Patient with ongoing dysarthric speech.  Patient is not a candidate for TPA given Lasix known well time of 11 PM last night.  Initial head CT shows no acute pathology but lots of chronic stroke changes.  Patient has a history of cardiac disease.  Last stroke was in 2015.  Patient will undergo MRI imaging at this time.  Admission to the hospital.  Spoke with family practice who will admit the patient.  Neurology will  consult on the patient.    Jola Schmidt, MD 02/11/15 [place], MD 02/11/15 [phone]

## 2015-02-11 NOTE — ED Notes (Signed)
Spoke with [employer] Rep, [name] reports no events since November. Loop recorder is working despite MRI.

## 2015-02-11 NOTE — ED Notes (Signed)
MRI calling reporting pt is having back pain and restless legs. Order obtained.

## 2015-02-11 NOTE — Progress Notes (Addendum)
ANTICOAGULATION CONSULT NOTE - Initial Consult  Pharmacy Consult for Warfarin Indication: atrial fibrillation  Allergies  Allergen Reactions  . Cephalexin     Unknown  . Doxycycline     Unknown  . Keflex [Cephalexin]   . Lac Bovis Other (See Comments)    lactose intolerant  . Pentazocine Lactate     He passed out- he had a seizure.  This occurred around 2000  . Talwin [Pentazocine]     Patient Measurements: Height: 5' 7"  (170.2 cm) Weight: 219 lb 6.4 oz (99.519 kg) IBW/kg (Calculated) : 66.1 Heparin Dosing Weight:   Vital Signs: Temp: 98.1 F (36.7 C) (06/18 1442) Temp Source: Rectal (06/18 0842) BP: 114/62 mmHg (06/18 1442) Pulse Rate: 67 (06/18 1442)  Labs:  Recent Labs  02/11/15 0905 02/11/15 0913  HGB 12.3* 13.3  HCT 36.7* 39.0  PLT 224  --   APTT 36  --   LABPROT 23.2*  --   INR 2.07*  --   CREATININE 0.89 0.90    Estimated Creatinine Clearance: 85.9 mL/min (by C-G formula based on Cr of 0.9).   Medical History: Past Medical History  Diagnosis Date  . Coronary artery disease     a. Low level exercise Lex MV 3/14: low risk, EF 62%, inf defect 2/2 diaph attenuation vs artifiact, small area of scar possible, no ischemia  . Hyperlipidemia   . Hypertension   . History of seizure disorder   . History of atrial flutter   . Depression   . Back pain     persistent  . Hearing loss   . Diverticulitis   . H/O alcohol abuse   . Diabetes mellitus     controled by diet  . GERD (gastroesophageal reflux disease)   . Seizures   . Headache(784.0)   . Arthritis     of spine  . Sleep apnea     does not use CPAP  . Hx of echocardiogram     Echo 7/14:  Mod LVH, EF 55-60%, Gr 1 DD, mild MR, PASP 36  . AKI (acute kidney injury)     a. 6/14: resolved after d/c ARB;  b. RA U/S 7/14: no RA stenosis  . Atrial flutter   . Stroke   . Shortness of breath   . Renal stone 04/15/2014    Medications:  Prescriptions prior to admission  Medication Sig Dispense  Refill Last Dose  . atorvastatin (LIPITOR) 80 MG tablet Take 1 tablet (80 mg total) by mouth daily. (Patient taking differently: Take 80 mg by mouth at bedtime. ) 30 tablet 11 02/10/2015 at Unknown time  . FLUoxetine (PROZAC) 10 MG capsule Take 30 mg by mouth every morning.   02/11/2015 at Unknown time  . furosemide (LASIX) 20 MG tablet TAKE 1-2 TABLETS BY MOUTH EVERY DAY AS NEEDED (Patient taking differently: 2 DAILY in the morning (4am)) 60 tablet 3 02/11/2015 at Unknown time  . methocarbamol (ROBAXIN) 750 MG tablet Take 750 mg by mouth every 8 (eight) hours as needed for muscle spasms.    02/10/2015 at Unknown time  . metoprolol (LOPRESSOR) 50 MG tablet Take 1 tablet (50 mg total) by mouth 2 (two) times daily. (Patient taking differently: Take 25 mg by mouth 2 (two) times daily. ) 60 tablet 3 02/11/2015 at 0400  . pantoprazole (PROTONIX) 40 MG tablet Take 40 mg by mouth 2 (two) times daily before a meal.   02/11/2015 at Unknown time  . potassium citrate (UROCIT-K) 10 MEQ (1080 MG)  SR tablet Take 10 mEq by mouth 3 (three) times daily with meals.    02/11/2015 at Unknown time  . pregabalin (LYRICA) 50 MG capsule Take 50 mg by mouth 3 (three) times daily.    02/11/2015 at Unknown time  . sitaGLIPtin (JANUVIA) 100 MG tablet Take 100 mg by mouth every morning.    02/11/2015 at Unknown time  . traZODone (DESYREL) 150 MG tablet Take 150 mg by mouth at bedtime.   02/10/2015 at Unknown time  . warfarin (COUMADIN) 5 MG tablet Take as directed by anticoagulation clinic (Patient taking differently: Take 5-10 mg by mouth every evening. Take one tablet (249m) on Tuesdays, Fridays, and Sundays. On all other days of the week, take two tablets (171m.) 45 tablet 3 02/11/2015 at 0400  . glucose blood (ONE TOUCH ULTRA TEST) test strip    Taking  . nitroGLYCERIN (NITROSTAT) 0.4 MG SL tablet Place 1 tablet (0.4 mg total) under the tongue every 5 (five) minutes as needed. For chest pain 25 tablet 0 Never used  . ONETOUCH DELICA  LANCETS FINE MISC    Taking   Scheduled:  .  stroke: mapping our early stages of recovery book   Does not apply Once  . atorvastatin  80 mg Oral QHS  . enoxaparin (LOVENOX) injection  40 mg Subcutaneous Q24H  . [START ON 02/12/2015] FLUoxetine  30 mg Oral q morning - 10a  . linagliptin  5 mg Oral Daily  . metoprolol  25 mg Oral BID  . pantoprazole  40 mg Oral BID AC  . pregabalin  50 mg Oral TID  . traZODone  150 mg Oral QHS   Infusions:    Assessment: [age]yoale with history of stroke, seizures and aflutter on warfarin presents with dysarthria. Pharmacy is consulted to dose warfarin for atrial flutter. INR 2.1, CBC wnl, sCr 0.9.  PTA Warfarin dose: 49m44mues/Fri/Sun and 26m57mDs with last dose early this morning  Goal of Therapy:  INR 2-3 Monitor platelets by anticoagulation protocol: Yes   Plan:  Hold warfarin tonight Daily INR/CBC Monitor s/sx of bleeding  CoreAndrey CotallDiona FoleyarmD Clinical Pharmacist Pager [phone]/2016,3:06 PM

## 2015-02-11 NOTE — Discharge Summary (Signed)
[facility] Discharge Summary  Patient name: [name] Medical record number: [id number] Date of birth: February 08, 1946 Age: 69 y.o. Gender: male Date of Admission: 02/11/2015  Date of Discharge: 02/13/15 Admitting Physician: Zenia Resides, MD  Primary Care Provider: [name], MD Consultants: neurology   Indication for Hospitalization: Dysarthria   Discharge Diagnoses/Problem List:  CVA CAD  T2DM HLD  H/o EtOH  GERD H/o PE  OSA MDD   Disposition: Home  Discharge Condition: stable, improved  Brief Hospital Course:  [name] is a 69 y/o male with a PMHx of CVA who presented to the ED with dysarthria and generalized weakness. Per his wife, he was in his normal state of health at bed time (11pm) and she noted slurred speech when she was awoken around 3:45am when she heard him fall on his buttocks. Per the pt, he did not injury himself or hit his head.   In the ED, CT was negative however a MRI revealed a chronic right ACA infarct in the right frontal lobe with a small area of acute infarct in the right frontal cortex over the convexity compatible with extension of infarction. Neurology was consulted and he was admitted for further work up.    No events were noted on telemetry. He was noted to have patent ICAs, with only 1-39% stenosis bilaterally). Unfortunately his TGs were too high to evaluate for LDL (to assess an appropriate reduction). His speech was back to baseline on the day of discharge and he was discharged with HH-PT (given his residual deficits from prior CVAs).   Issues for Follow Up:  1. We opted to continue ASA 74m in addition to Eliquis give h/o CAD with CABG. If falls risk increases, could consider discontinuing this.  2. Follow up on HR and BP, pt continued on what he reportedly was taking (metop 277mBID) with stable BPs and HRs.   Significant Procedures: none   Significant Labs and Imaging:   Recent Labs Lab 02/11/15 0905  02/11/15 0913 02/13/15 0630  WBC 7.4  --  6.8  HGB 12.3* 13.3 12.2*  HCT 36.7* 39.0 37.0*  PLT 224  --  213    Recent Labs Lab 02/11/15 0905 02/11/15 0913  NA 136 138  K 3.9 3.9  CL 101 100*  CO2 26  --   GLUCOSE 117* 115*  BUN 16 19  CREATININE 0.89 0.90  CALCIUM 9.0  --   ALKPHOS 80  --   AST 23  --   ALT 22  --   ALBUMIN 3.7  --     Risk Stratification Labs  TSH    Component Value Date/Time   TSH 2.44 12/08/2014 1009   Hemoglobin A1C    Component Value Date/Time   HGBA1C 7.1* 02/12/2015 0550   HGBA1C 6.3 02/09/2013 1659   Lipid Panel     Component Value Date/Time   CHOL 195 02/12/2015 0550   TRIG 431* 02/12/2015 0550   HDL 33* 02/12/2015 0550   CHOLHDL 5.9 02/12/2015 0550   VLDL UNABLE TO CALCULATE IF TRIGLYCERIDE OVER 400 mg/dL 02/12/2015 0550   LDLCALC UNABLE TO CALCULATE IF TRIGLYCERIDE OVER 400 mg/dL 02/12/2015 0550   02/11/2015 CLINICAL DATA: Acute onset of slurred speech and generalized weakness. Patient fell to knees. Patient on Coumadin. Initial encounter. EXAM: CT HEAD WITHOUT CONTRAST TECHNIQUE: Contiguous axial images were obtained from the base of the skull through the vertex without intravenous contrast. COMPARISON: CT of the head performed 04/01/2014,  and MRI/MRA of the brain performed 11/14/2013 FINDINGS: There is no evidence of acute infarction, mass lesion, or intra- or extra-axial hemorrhage on CT. Prominence of the ventricles suggests mild cortical volume loss. Cavum septum pellucidum is noted. A chronic infarct is noted at the high right frontoparietal region, with associated encephalomalacia. Chronic infarct extends inferiorly along the medial right frontal lobe. Small chronic lacunar infarcts are seen in the basal ganglia bilaterally. Mild periventricular Fallas matter change likely reflects small vessel ischemic microangiopathy. The brainstem and fourth ventricle are within normal limits. No mass effect or midline shift is seen.  There is no evidence of fracture; visualized osseous structures are unremarkable in appearance. The orbits are within normal limits. The paranasal sinuses and mastoid air cells are well-aerated. No significant soft tissue abnormalities are seen. IMPRESSION: 1. No acute intracranial pathology seen on CT. 2. Mild cortical volume loss and scattered small vessel ischemic microangiopathy. 3. Chronic infarct at the high right frontoparietal region, with associated encephalomalacia. Chronic infarct extends inferiorly along the medial right frontal lobe. 4. Small chronic lacunar infarcts at the basal ganglia bilaterally. Electronically Signed By: Garald Balding M.D. On: 02/11/2015 09:41   Mr Angiogram Head Wo Contrast  02/11/2015 CLINICAL DATA: Stroke symptoms. Dysarthric speech. On Coumadin for atrial flutter and PE EXAM: MRI HEAD WITHOUT CONTRAST MRA HEAD WITHOUT CONTRAST TECHNIQUE: Multiplanar, multiecho pulse sequences of the brain and surrounding structures were obtained without intravenous contrast. Angiographic images of the head were obtained using MRA technique without contrast. COMPARISON: MRI 11/14/2013 FINDINGS: MRI HEAD FINDINGS Acute infarct in the high right frontal lobe over the convexity. The prior MRI demonstrated acute infarct in this territory and there appears to be extension of this infarct which is in the right anterior cerebral artery territory. No other area of acute infarct. Ventricular enlargement is stable and consistent with atrophy. Chronic microvascular ischemic change in the Weimer matter. Chronic lacunar infarction in the left pons has developed since the prior study. Negative for mass or edema. No shift of the midline structures. Mild chronic hemorrhage in the chronic right frontal lobe infarct. MRA HEAD FINDINGS Both vertebral arteries patent to the basilar. PICA patent bilaterally. Basilar widely patent. Severe stenosis left posterior cerebral artery unchanged  from the prior study. Mild irregularity right posterior cerebral artery Atherosclerotic irregularity in the cavernous carotid bilaterally without significant stenosis. Anterior and middle cerebral arteries widely patent without significant stenosis. Negative for cerebral aneurysm IMPRESSION: Chronic right ACA infarct in the right frontal lobe. There is now a small area of acute infarct in the right frontal cortex over the convexity compatible with extension of infarction. Atrophy and chronic microvascular ischemic changes. Chronic infarct left pons has developed since 2015. Severe stenosis left posterior cerebral artery is unchanged from the prior study. Right anterior cerebral artery is widely patent. Electronically Signed By: Franchot Gallo M.D. On: 02/11/2015 12:58   Mr Brain Wo Contrast  02/11/2015 CLINICAL DATA: Stroke symptoms. Dysarthric speech. On Coumadin for atrial flutter and PE EXAM: MRI HEAD WITHOUT CONTRAST MRA HEAD WITHOUT CONTRAST TECHNIQUE: Multiplanar, multiecho pulse sequences of the brain and surrounding structures were obtained without intravenous contrast. Angiographic images of the head were obtained using MRA technique without contrast. COMPARISON: MRI 11/14/2013 FINDINGS: MRI HEAD FINDINGS Acute infarct in the high right frontal lobe over the convexity. The prior MRI demonstrated acute infarct in this territory and there appears to be extension of this infarct which is in the right anterior cerebral artery territory. No other area of acute infarct. Ventricular  enlargement is stable and consistent with atrophy. Chronic microvascular ischemic change in the Gertner matter. Chronic lacunar infarction in the left pons has developed since the prior study. Negative for mass or edema. No shift of the midline structures. Mild chronic hemorrhage in the chronic right frontal lobe infarct. MRA HEAD FINDINGS Both vertebral arteries patent to the basilar. PICA patent  bilaterally. Basilar widely patent. Severe stenosis left posterior cerebral artery unchanged from the prior study. Mild irregularity right posterior cerebral artery Atherosclerotic irregularity in the cavernous carotid bilaterally without significant stenosis. Anterior and middle cerebral arteries widely patent without significant stenosis. Negative for cerebral aneurysm IMPRESSION: Chronic right ACA infarct in the right frontal lobe. There is now a small area of acute infarct in the right frontal cortex over the convexity compatible with extension of infarction. Atrophy and chronic microvascular ischemic changes. Chronic infarct left pons has developed since 2015. Severe stenosis left posterior cerebral artery is unchanged from the prior study. Right anterior cerebral artery is widely patent. Electronically Signed By: Franchot Gallo M.D. On: 02/11/2015 12:58    Results/Tests Pending at Time of Discharge: none  Discharge Medications:    Medication List    STOP taking these medications        pregabalin 50 MG capsule  Commonly known as:  LYRICA     warfarin 5 MG tablet  Commonly known as:  COUMADIN      TAKE these medications        apixaban 5 MG Tabs tablet  Commonly known as:  ELIQUIS  Take 1 tablet (5 mg total) by mouth 2 (two) times daily.     aspirin 81 MG EC tablet  Take 1 tablet (81 mg total) by mouth daily.     atorvastatin 80 MG tablet  Commonly known as:  LIPITOR  Take 1 tablet (80 mg total) by mouth daily.     FLUoxetine 10 MG capsule  Commonly known as:  PROZAC  Take 30 mg by mouth every morning.     furosemide 20 MG tablet  Commonly known as:  LASIX  TAKE 1-2 TABLETS BY MOUTH EVERY DAY AS NEEDED     methocarbamol 750 MG tablet  Commonly known as:  ROBAXIN  Take 750 mg by mouth every 8 (eight) hours as needed for muscle spasms.     metoprolol 50 MG tablet  Commonly known as:  LOPRESSOR  Take 0.5 tablets (25 mg total) by mouth 2 (two) times daily.      nitroGLYCERIN 0.4 MG SL tablet  Commonly known as:  NITROSTAT  Place 1 tablet (0.4 mg total) under the tongue every 5 (five) minutes as needed. For chest pain     ONE TOUCH ULTRA TEST test strip  Generic drug:  glucose blood     ONETOUCH DELICA LANCETS FINE Misc     pantoprazole 40 MG tablet  Commonly known as:  PROTONIX  Take 40 mg by mouth 2 (two) times daily before a meal.     potassium citrate 10 MEQ (1080 MG) SR tablet  Commonly known as:  UROCIT-K  Take 10 mEq by mouth 3 (three) times daily with meals.     sitaGLIPtin 100 MG tablet  Commonly known as:  JANUVIA  Take 100 mg by mouth every morning.     traZODone 150 MG tablet  Commonly known as:  DESYREL  Take 150 mg by mouth at bedtime.        Discharge Instructions: Please refer to Patient Instructions section of EMR for  full details.  Patient was counseled important signs and symptoms that should prompt return to medical care, changes in medications, dietary instructions, activity restrictions, and follow up appointments.   Follow-Up Appointments: Follow-up Information    Follow up with [name],PRAMOD, MD.   Specialties:  Neurology, Radiology   Why:  Stroke Clinic, Office will call you with appointment date & time   Contact information:   [place] [address] [phone]       Follow up with [facility].   Why:  HHPT ordered by your MD; Call if you have not heard from THEM in 1-2 days!   Contact information:   [address] [place] [address] [phone]       Schedule an appointment as soon as possible for a visit with DAUB, Lina Sayre, MD.   Specialty:  Family Medicine   Why:  in the next week   Contact information:   [place] [address] [phone]       Schedule an appointment as soon as possible for a visit with Christinia Gully, MD.   Specialty:  Pulmonary Disease   Why:  in the next 1-2 weeks   Contact information:   [address]  [place] [address] [phone]       Crystal S Dorsey, MD 02/14/2015, 7:17 PM PGY-1, [facility]

## 2015-02-11 NOTE — Progress Notes (Signed)
Patient assisted with BiPap setup and placement, using patient's equipment from home.  Equipment appears to be in good working order, Biomed to be called in am.  Patient is familiar with equipment and procedure.  3L 02 bleed in applied.

## 2015-02-11 NOTE — ED Notes (Addendum)
Per EMS- Pt comes from home where he went to bed at 11 pm last night normal, this morning when he woke up at 0730 his wife reports his speech was garbled and muffled. Pt was weak all over body and fell to his knees, no LOC, is taking coumadin. Pt is alert but is disoriented to time. CBG 142, BP 110/70 SR, 95% 4 L, which he is always on home O2. Pt reports he was up a lot last night with frequent urination, EMS was called out at 0400 but didn't want to be transported.

## 2015-02-11 NOTE — Consult Note (Signed)
Neurology Consultation Reason for Consult: Stroke Referring Physician: Andria Frames, W  CC: Stroke  History is obtained from: Patient, wife  HPI: [name] is a 69 y.o. male with a history of previous stroke as well as seizures as well as atrial flutter who is on anticoagulation due to previous PEs who presents with dysarthria that was present on awakening this morning. He states that he did have a subtherapeutic INR about 2 weeks ago of 1.5. This was checked while they were doing a tooth cleaning.  Currently, he denies any new weakness or numbness just states that he is having difficulty with speech.   LKW: 6/17 prior to bed tpa given?: no, out of window    ROS: A 14 point ROS was performed and is negative except as noted in the HPI.   Past Medical History  Diagnosis Date  . Coronary artery disease     a. Low level exercise Lex MV 3/14: low risk, EF 62%, inf defect 2/2 diaph attenuation vs artifiact, small area of scar possible, no ischemia  . Hyperlipidemia   . Hypertension   . History of seizure disorder   . History of atrial flutter   . Depression   . Back pain     persistent  . Hearing loss   . Diverticulitis   . H/O alcohol abuse   . Diabetes mellitus     controled by diet  . GERD (gastroesophageal reflux disease)   . Seizures   . Headache(784.0)   . Arthritis     of spine  . Sleep apnea     does not use CPAP  . Hx of echocardiogram     Echo 7/14:  Mod LVH, EF 55-60%, Gr 1 DD, mild MR, PASP 36  . AKI (acute kidney injury)     a. 6/14: resolved after d/c ARB;  b. RA U/S 7/14: no RA stenosis  . Atrial flutter   . Stroke   . Shortness of breath   . Renal stone 04/15/2014    Family History: Mother-stroke at 59  Social History: Tob: Denies  Exam: Current vital signs: BP 114/62 mmHg  Pulse 67  Temp(Src) 98.1 F (36.7 C) (Rectal)  Resp 22  SpO2 96% Vital signs in last 24 hours: Temp:  [98.1 F (36.7 C)-98.4 F (36.9 C)] 98.1 F (36.7 C) (06/18  1442) Pulse Rate:  [66-75] 67 (06/18 1442) Resp:  [12-26] 22 (06/18 1442) BP: (107-131)/(36-91) 114/62 mmHg (06/18 1442) SpO2:  [91 %-100 %] 96 % (06/18 1442)  Physical Exam  Constitutional: Appears well-developed and well-nourished.  Psych: Affect appropriate to situation Eyes: No scleral injection HENT: No OP obstrucion Head: Normocephalic.  Cardiovascular: Normal rate and regular rhythm.  Respiratory: Effort normal and breath sounds normal to anterior ascultation GI: Soft.  No distension. There is no tenderness.  Skin: WDI  Neuro: Mental Status: Patient is awake, alert, oriented to person, place, month, year, and situation. Patient is able to give a clear and coherent history. No signs of aphasia or neglect Cranial Nerves: II: Visual Fields are full. Pupils are equal, round, and reactive to light.   III,IV, VI: EOMI without ptosis or diploplia.  V: Facial sensation is symmetric to temperature VII: Facial movement is possibly mildly weak on the left VIII: hearing is intact to voice X: Uvula elevates symmetrically XI: Shoulder shrug is symmetric. XII: tongue is midline without atrophy or fasciculations.  Motor: Tone is normal. Bulk is normal. 5/5 strength was present in all four extremities.  He does not have any drift, but might have very mild 5 minus/5 weakness of the left arm and leg Sensory: Sensation is symmetric to light touch and temperature in the arms and legs. Cerebellar: FNF  intact bilaterally         I have reviewed labs in epic and the results pertinent to this consultation are:   I have reviewed the images obtained: MRI brain-extension of previous right ACA territory infarct  Impression: 69 year old male with extension of previous infarct of unclear etiology. There is no clear stenosis in the  vessel supplying this distribution. One possibility would be that this represents repeat embolus the setting of a recently subtherapeutic INR, though it is  unclear if he has had any atrial flutter in the time. Also possible be atherosclerotic disease and a secondary stroke risk factor stratification should be performed.  Recommendations: 1. HgbA1c, fasting lipid panel 2. MRI, MRA  of the brain without contrast 3. Frequent neuro checks 4. Echocardiogram 5. Carotid dopplers 6. Prophylactic therapy-Antiplatelet med: Aspirin - dose 362m PO or 302m, would hold Coumadin for now. 7. Risk factor modification 8. Telemetry monitoring 9. PT consult, OT consult, Speech consult    McRoland RackMD [facility] [phone] 7pm- 7am, please page neurology on call as listed in [facility]

## 2015-02-11 NOTE — ED Notes (Signed)
Spoke with the [employer] rep Dannial Monarch, reports to interrogate device when he returns from MRI. Her contact info: [phone].

## 2015-02-12 DIAGNOSIS — Z86711 Personal history of pulmonary embolism: Secondary | ICD-10-CM | POA: Insufficient documentation

## 2015-02-12 DIAGNOSIS — R471 Dysarthria and anarthria: Secondary | ICD-10-CM | POA: Insufficient documentation

## 2015-02-12 DIAGNOSIS — I639 Cerebral infarction, unspecified: Secondary | ICD-10-CM | POA: Insufficient documentation

## 2015-02-12 LAB — LIPID PANEL
CHOL/HDL RATIO: 5.9 ratio
Cholesterol: 195 mg/dL (ref 0–200)
HDL: 33 mg/dL — AB (ref >40)
LDL CALC: UNDETERMINED mg/dL (ref 0–99)
Triglycerides: 431 mg/dL — ABNORMAL HIGH (ref <150)
VLDL: UNDETERMINED mg/dL (ref 0–40)

## 2015-02-12 LAB — GLUCOSE, CAPILLARY
Glucose-Capillary: 173 mg/dL — ABNORMAL HIGH (ref 65–99)
Glucose-Capillary: 139 mg/dL — ABNORMAL HIGH (ref 65–99)

## 2015-02-12 NOTE — Evaluation (Signed)
Physical Therapy Evaluation Patient Details Name: [name] MRN: [id number] DOB: 25-Aug-1946 Today's Date: 02/12/2015   History of Present Illness  Pt is a 69 y/o male with a PMH of CVA who presented to the ED with dysarthria and generalized weakness. Per his wife, he was in his normal state of health at bed time (11pm) and she noted slurred speech when she was awoken around 3:45am when she heard him fall on his buttocks. Per the pt, he did not injury himself or hit his head.  Tests reveal extension of prior Rt ACA infarct. Pt also has chronic basal ganglia lacunar infarcts.  Clinical Impression  Patient with generalized fatigue. Patient was limited by decr oxygen saturation on RA during gait.  Patient may benefit from skilled PT while on this venue of care to assist with return to prior level of function.   Patient will benefit from continued skilled PT to reduce fall risk, improve functional mobility and reduce pain.   Follow Up Recommendations Home health PT;Supervision/Assistance - 24 hour    Equipment Recommendations  None recommended by PT    Recommendations for Other Services       Precautions / Restrictions Precautions Precautions: Fall;Other (comment) Precaution Comments: monitor Oxygen saturation Restrictions Weight Bearing Restrictions: No Other Position/Activity Restrictions: uses supplemental O2 at home on occasion      Mobility  Bed Mobility Overal bed mobility: Needs Assistance Bed Mobility: Sit to Supine       Sit to supine: Min assist (for leg management, min cues for sequencing to reposition)   General bed mobility comments: supervision to scoot to [facility] with mod cues for technique with rail  Transfers Overall transfer level: Needs assistance Equipment used: None Transfers: Sit to/from Stand Sit to Stand: Supervision         General transfer comment: from bed with UE support  Ambulation/Gait Ambulation/Gait assistance: Min guard Ambulation  Distance (Feet): 90 Feet Assistive device: None Gait Pattern/deviations: Decreased step length - right;Decreased step length - left;WFL(Within Functional Limits)     General Gait Details: 2 standing rest breaks to manage oxygen saturation  Stairs            Wheelchair Mobility    Modified Rankin (Stroke Patients Only) Modified Rankin (Stroke Patients Only) Pre-Morbid Rankin Score: Slight disability Modified Rankin: Moderate disability     Balance Overall balance assessment: Needs assistance Sitting-balance support: Feet supported;No upper extremity supported Sitting balance-Leahy Scale: Good     Standing balance support: No upper extremity supported Standing balance-Leahy Scale: Fair                               Pertinent Vitals/Pain Pain Assessment: 0-10 Pain Score: 4  Pain Location: back (chronic pain) Pain Descriptors / Indicators: Sore;Aching Pain Intervention(s): Limited activity within patient's tolerance;Monitored during session    Home Living Family/patient expects to be discharged to:: Private residence Living Arrangements: Spouse/significant other Available Help at Discharge: Family;Available 24 hours/day Type of Home: House Home Access: Level entry;Other (comment) (via garage to initial level, ramped front entry)     Home Layout: Multi-level;Bed/bath upstairs Home Equipment: Walker - 2 wheels;Electric scooter (currently getting bathroom renovated by [employer] for shower seat )      Prior Function Level of Independence: Needs assistance   Gait / Transfers Assistance Needed: used 2 wheeled RW outdoors, did not use device in home (takes scooter during shopping)  Hand Dominance   Dominant Hand: Left    Extremity/Trunk Assessment   Upper Extremity Assessment: Overall WFL for tasks assessed (4/5 bil shoulder flexion, abduction, 4+/5 bil elbow flex/ext)           Lower Extremity Assessment: Overall WFL for tasks assessed  (4/5 bil hip flexion, 4+/5 knee ext and bil ankle DF)      Cervical / Trunk Assessment: Kyphotic  Communication   Communication: Expressive difficulties  Cognition Arousal/Alertness: Awake/alert Behavior During Therapy: WFL for tasks assessed/performed Overall Cognitive Status: Within Functional Limits for tasks assessed       Memory: Decreased short-term memory              General Comments General comments (skin integrity, edema, etc.): 89% on RA, HR 92 bpm    Exercises        Assessment/Plan    PT Assessment Patient needs continued PT services  PT Diagnosis Abnormality of gait;Hemiplegia dominant side   PT Problem List Decreased activity tolerance;Decreased balance;Decreased mobility;Cardiopulmonary status limiting activity;Pain  PT Treatment Interventions DME instruction;Gait training;Stair training;Functional mobility training;Therapeutic activities;Therapeutic exercise;Balance training;Neuromuscular re-education;Patient/family education   PT Goals (Current goals can be found in the Care Plan section) Acute Rehab PT Goals Patient Stated Goal: get stronger PT Goal Formulation: With patient/family Time For Goal Achievement: 02/20/15 Potential to Achieve Goals: Good    Frequency Min 4X/week   Barriers to discharge        Co-evaluation               End of Session Equipment Utilized During Treatment: Gait belt Activity Tolerance: Patient limited by fatigue Patient left: in bed;with call bell/phone within reach;with bed alarm set;with SCD's reapplied;with family/visitor present Nurse Communication: Mobility status         Time: 1227-1252 PT Time Calculation (min) (ACUTE ONLY): 25 min   Charges:   PT Evaluation $Initial PT Evaluation Tier I: 1 Procedure     PT G CodesMalka So, PT [phone]  [place] 02/12/2015, 1:49 PM

## 2015-02-12 NOTE — Progress Notes (Signed)
ANTICOAGULATION CONSULT NOTE - Initial Consult  Pharmacy Consult for Warfarin Indication: atrial fibrillation  Allergies  Allergen Reactions  . Cephalexin     Unknown  . Doxycycline     Unknown  . Keflex [Cephalexin]   . Lac Bovis Other (See Comments)    lactose intolerant  . Pentazocine Lactate     He passed out- he had a seizure.  This occurred around 2000  . Talwin [Pentazocine]   . Tape Other (See Comments)    Paper tape only please.  Freddi Starr [Tizanidine Hcl] Other (See Comments)    Lightheaded and dizzy    Patient Measurements: Height: 5' 7"  (170.2 cm) Weight: 219 lb 6.4 oz (99.519 kg) IBW/kg (Calculated) : 66.1 Heparin Dosing Weight:   Vital Signs: Temp: 98.3 F (36.8 C) (06/19 0900) Temp Source: Oral (06/19 0900) BP: 110/53 mmHg (06/19 0900) Pulse Rate: 68 (06/19 0900)  Labs:  Recent Labs  02/11/15 0905 02/11/15 0913  HGB 12.3* 13.3  HCT 36.7* 39.0  PLT 224  --   APTT 36  --   LABPROT 23.2*  --   INR 2.07*  --   CREATININE 0.89 0.90    Estimated Creatinine Clearance: 85.9 mL/min (by C-G formula based on Cr of 0.9).   Medical History: Past Medical History  Diagnosis Date  . Coronary artery disease     a. Low level exercise Lex MV 3/14: low risk, EF 62%, inf defect 2/2 diaph attenuation vs artifiact, small area of scar possible, no ischemia  . Hyperlipidemia   . Hypertension   . History of seizure disorder   . History of atrial flutter   . Depression   . Back pain     persistent  . Hearing loss   . Diverticulitis   . H/O alcohol abuse   . Diabetes mellitus     controled by diet  . GERD (gastroesophageal reflux disease)   . Seizures   . Headache(784.0)   . Arthritis     of spine  . Sleep apnea     does not use CPAP  . Hx of echocardiogram     Echo 7/14:  Mod LVH, EF 55-60%, Gr 1 DD, mild MR, PASP 36  . AKI (acute kidney injury)     a. 6/14: resolved after d/c ARB;  b. RA U/S 7/14: no RA stenosis  . Atrial flutter   . Stroke    . Shortness of breath   . Renal stone 04/15/2014    Medications:  Prescriptions prior to admission  Medication Sig Dispense Refill Last Dose  . atorvastatin (LIPITOR) 80 MG tablet Take 1 tablet (80 mg total) by mouth daily. (Patient taking differently: Take 80 mg by mouth at bedtime. ) 30 tablet 11 02/10/2015 at Unknown time  . FLUoxetine (PROZAC) 10 MG capsule Take 30 mg by mouth every morning.   02/11/2015 at Unknown time  . furosemide (LASIX) 20 MG tablet TAKE 1-2 TABLETS BY MOUTH EVERY DAY AS NEEDED (Patient taking differently: 40 mg DAILY in the morning (4am)) 60 tablet 3 02/11/2015 at Unknown time  . methocarbamol (ROBAXIN) 750 MG tablet Take 750 mg by mouth every 8 (eight) hours as needed for muscle spasms.    02/10/2015 at Unknown time  . metoprolol (LOPRESSOR) 50 MG tablet Take 1 tablet (50 mg total) by mouth 2 (two) times daily. (Patient taking differently: Take 25 mg by mouth 2 (two) times daily. ) 60 tablet 3 02/11/2015 at 0400  . pantoprazole (PROTONIX) 40 MG  tablet Take 40 mg by mouth 2 (two) times daily before a meal.   02/11/2015 at Unknown time  . potassium citrate (UROCIT-K) 10 MEQ (1080 MG) SR tablet Take 10 mEq by mouth 3 (three) times daily with meals.    02/11/2015 at Unknown time  . pregabalin (LYRICA) 50 MG capsule Take 50 mg by mouth 3 (three) times daily.    02/11/2015 at Unknown time  . sitaGLIPtin (JANUVIA) 100 MG tablet Take 100 mg by mouth every morning.    02/11/2015 at Unknown time  . traZODone (DESYREL) 150 MG tablet Take 150 mg by mouth at bedtime.   02/10/2015 at Unknown time  . warfarin (COUMADIN) 5 MG tablet Take as directed by anticoagulation clinic (Patient taking differently: Take 5-10 mg by mouth every evening. Take one tablet (141m) on Tuesdays, Fridays, and Sundays. On all other days of the week, take two tablets (154m.) 45 tablet 3 02/11/2015 at 0400  . glucose blood (ONE TOUCH ULTRA TEST) test strip    Taking  . nitroGLYCERIN (NITROSTAT) 0.4 MG SL tablet Place 1  tablet (0.4 mg total) under the tongue every 5 (five) minutes as needed. For chest pain 25 tablet 0 Never used  . ONETOUCH DELICA LANCETS FINE MISC    Taking   Scheduled:  . aspirin  325 mg Oral Daily  . atorvastatin  80 mg Oral QHS  . [START ON 02/13/2015] FLUoxetine  30 mg Oral q morning - 10a  . linagliptin  5 mg Oral Daily  . metoprolol  25 mg Oral BID  . pantoprazole  40 mg Oral BID AC  . pregabalin  50 mg Oral TID  . traZODone  150 mg Oral QHS   Infusions:    Assessment: [age]yoale with history of stroke, seizures and aflutter on warfarin presents with dysarthria. Pharmacy is consulted to dose warfarin for atrial flutter. INR 2.1, CBC wnl, sCr 0.9.  PTA Warfarin dose: 41m70mues/Fri/Sun and 48m54mDs with last dose early this morning  Goal of Therapy:  INR 2-3 Monitor platelets by anticoagulation protocol: Yes   Plan:  Starting ASA 3241mg29mly while holding warfarin per neurology Daily INR/CBC Monitor s/sx of bleeding  CoreyAndrey Cotal,Diona FoleyrmD Clinical Pharmacist Pager [phone]/2016,11:41 AM

## 2015-02-12 NOTE — Progress Notes (Signed)
STROKE TEAM PROGRESS NOTE   HISTORY [name] is a 69 y.o. male with a history of previous stroke as well as seizures as well as atrial flutter who is on anticoagulation due to previous PEs who presents with dysarthria that was present on awakening this morning. He states that he did have a subtherapeutic INR about 2 weeks ago of 1.5. This was checked while they were doing a tooth cleaning.  Currently, he denies any new weakness or numbness just states that he is having difficulty with speech.   LKW: 6/17 prior to bed tpa given?: no, out of window   SUBJECTIVE (INTERVAL HISTORY) His wife is at the bedside.  Overall he feels his condition is better.    OBJECTIVE Temp:  [97.4 F (36.3 C)-98.4 F (36.9 C)] 98.2 F (36.8 C) (06/19 0245) Pulse Rate:  [66-75] 73 (06/19 0030) Cardiac Rhythm:  [-] Normal sinus rhythm (06/19 0100) Resp:  [12-26] 16 (06/19 0245) BP: (99-131)/(36-91) 103/55 mmHg (06/19 0245) SpO2:  [91 %-100 %] 93 % (06/19 0245) Weight:  [99.519 kg (219 lb 6.4 oz)] 99.519 kg (219 lb 6.4 oz) (06/18 1500)   Recent Labs Lab 02/11/15 0907 02/11/15 1929 02/11/15 2349 02/12/15 0558  GLUCAP 108* 135* 127* 173*    Recent Labs Lab 02/11/15 0905 02/11/15 0913  NA 136 138  K 3.9 3.9  CL 101 100*  CO2 26  --   GLUCOSE 117* 115*  BUN 16 19  CREATININE 0.89 0.90  CALCIUM 9.0  --     Recent Labs Lab 02/11/15 0905  AST 23  ALT 22  ALKPHOS 80  BILITOT 0.5  PROT 7.4  ALBUMIN 3.7    Recent Labs Lab 02/11/15 0905 02/11/15 0913  WBC 7.4  --   NEUTROABS 5.1  --   HGB 12.3* 13.3  HCT 36.7* 39.0  MCV 91.1  --   PLT 224  --    No results for input(s): CKTOTAL, CKMB, CKMBINDEX, TROPONINI in the last 168 hours.  Recent Labs  02/11/15 0905  LABPROT 23.2*  INR 2.07*    Recent Labs  02/11/15 0838  COLORURINE YELLOW  LABSPEC 1.009  PHURINE 5.5  GLUCOSEU NEGATIVE  HGBUR NEGATIVE  BILIRUBINUR NEGATIVE  KETONESUR NEGATIVE  PROTEINUR NEGATIVE   UROBILINOGEN 0.2  NITRITE NEGATIVE  LEUKOCYTESUR NEGATIVE       Component Value Date/Time   CHOL 195 02/12/2015 0550   TRIG 431* 02/12/2015 0550   HDL 33* 02/12/2015 0550   CHOLHDL 5.9 02/12/2015 0550   VLDL UNABLE TO CALCULATE IF TRIGLYCERIDE OVER 400 mg/dL 02/12/2015 0550   LDLCALC UNABLE TO CALCULATE IF TRIGLYCERIDE OVER 400 mg/dL 02/12/2015 0550   Lab Results  Component Value Date   HGBA1C 7.0* 04/15/2014      Component Value Date/Time   LABOPIA POSITIVE* 11/14/2013 1723   LABOPIA POSITIVE* 04/03/2012 0910   COCAINSCRNUR NONE DETECTED 11/14/2013 1723   COCAINSCRNUR NEGATIVE 04/03/2012 0910   LABBENZ NONE DETECTED 11/14/2013 1723   LABBENZ NEGATIVE 04/03/2012 0910   AMPHETMU NONE DETECTED 11/14/2013 1723   AMPHETMU NEGATIVE 04/03/2012 0910   THCU NONE DETECTED 11/14/2013 1723   LABBARB NONE DETECTED 11/14/2013 1723    No results for input(s): ETH in the last 168 hours.    Imaging   Ct Head Wo Contrast 02/11/2015    1. No acute intracranial pathology seen on CT.  2. Mild cortical volume loss and scattered small vessel ischemic microangiopathy.  3. Chronic infarct at the high right frontoparietal region,  with associated encephalomalacia. Chronic infarct extends inferiorly along the medial right frontal lobe.  4. Small chronic lacunar infarcts at the basal ganglia bilaterally.       Mr Angiogram Head Wo Contrast 02/11/2015     Chronic right ACA infarct in the right frontal lobe.   Small area of acute infarct in the right frontal cortex over the convexity compatible with extension of infarction.    Atrophy and chronic microvascular ischemic changes.   Chronic infarct left pons has developed since 2015.    Severe stenosis left posterior cerebral artery is unchanged from the prior study.   Right anterior cerebral artery is widely patent.        PHYSICAL EXAM  GENERAL: Pleasant.   HEENT: normal  ABDOMEN: soft  EXTREMITIES: No edema   BACK:  normal  SKIN: Normal by inspection.    MENTAL STATUS: Alert and oriented. Speech - subtle dysarthria, language and cognition are generally intact. Judgment and insight normal.   CRANIAL NERVES: Pupils are equal, round and reactive to light and accomodation; extra ocular movements are full, there is no significant nystagmus; visual fields are full; upper and lower facial muscles are normal in strength and symmetric, there is no flattening of the nasolabial folds; tongue is midline; uvula is midline; shoulder elevation is normal.  MOTOR: Normal tone, bulk and strength; no pronator drift.  COORDINATION: Left finger to nose is normal, right finger to nose is normal, No rest tremor; no intention tremor; no postural tremor; no bradykinesia.  SENSATION: Normal to light touch and temperature      ASSESSMENT/PLAN [name] is a 69 y.o. male with history of a previous right ACA infarct, seizure history, hyperlipidemia, hypertension, atrial flutter, pulmonary embolus history, Coumadin therapy, and diabetes mellitus, presenting with dysarthria. He did not receive IV t-PA due to late presentation. (Failed warfarin)  Stroke:  Non-dominant infarct felt to be embolic secondary to atrial flutter.  Resultant  Mild dysarthria  MRI - as above - small area of acute infarct in the right frontal cortex   MRA  Severe stenosis left posterior cerebral artery is unchanged from the prior study.   Carotid Doppler  pending  2D Echo  12/02/2014 - EF 55-60%.  No cardiac source of emboli identified.  LDL - unable to calculate due to triglycerides of 431  HgbA1c pending  SCDs for VTE prophylaxis  Diet heart healthy/carb modified Room service appropriate?: Yes; Fluid consistency:: Thin  warfarin prior to admission, now on aspirin 325 mg orally every day ---- DC both (Failed warfarin) and start ELIQUIST 02/13/15 -- low risk hemorrhagic transformation given small infarct  Patient counseled to be  compliant with his antithrombotic medications  Ongoing aggressive stroke risk factor management  Therapy recommendations:  Pending  Disposition:  Pending  Hypertension  Home meds: Lopressor (probably for rate control)  Blood pressure tends to run low   Hyperlipidemia  Home meds:  Lipitor 80 mg daily resumed in hospital  LDL unable to calculate, goal < 70  Add Fenofibrate ?  Continue statin at discharge  Diabetes  HgbA1c pending, goal < 7.0  Controlled  Other Stroke Risk Factors  Advanced age  Obesity, Body mass index is 34.35 kg/(m^2).   Hx stroke/TIA  Family hx stroke (grandmother)  Coronary artery disease  OSA - does not use C    Other Active Problems  Warfarin on hold  C/O air blowing out with nasal pillow  CPAP. Suggest trial of fullface mask.   Other  Pertinent History  Patient has loop recorder  Hospital day # 1  Dr. Merlene Laughter - The patient was seen and examined by me; notes, chart and tests reviewed and discussed with midlevel provider, other providers, patient, and family.       To contact Stroke Continuity provider, please refer to [email or web address] After hours, contact General Neurology

## 2015-02-12 NOTE — Progress Notes (Signed)
Family Medicine Teaching Service Daily Progress Note Intern Pager: [phone]  Patient name: [name] Medical record number: [id number] Date of birth: 01-May-1946 Age: 69 y.o. Gender: male  Primary Care Provider: [name], MD Consultants: neurology Code Status: FULL (discussed on admission)  Pt Overview and Major Events to Date:  06/18: Admitted  Assessment and Plan: [name] is a 69 y.o. male presenting with dysarthria. PMH is significant for CVA, CAD, DM2, asbestosis, seizure disorder, HTN, HLD, OSA, chronic back pain, EtOH abuse, and dysphage.   CVA: Patient presenting with dysarthria and generalized weakness. He has a h/o of CVAs x 3. CT of head was negative, however MRI/MRA revealed Chronic right ACA infarct in the right frontal lobe with a small area of acute infarct in the right frontal cortex over the convexity compatible with extension of infarction. Oddly, the R anterior cerebral artery was patent, therefore unsure if this was secondary to embolus from atrial flutter. EKG 6/18: sinus with borderline prolonged QT. - telemetry - Neurology consulted, appreciate recs -- hold coumadin, use ASA 353m. Would appreciate recs from Neuro as to when to restart coumadin. ? Add antiplatelet - Will not repeat echo as last was 4/16 and revealed EF 55-60% with grade 1 DD and a PA peak pressure of 34 - Will obtain carotid dopplers, as last was in 2015 - Risk stratification labs this AM - Atorvastatin 840m ASA 32540m- PT/OT/SLP  CAD H/o CABG 2005, stress test 10/26/12 showing low risk with small area of decreased uptake at base of inferior wall; EF 62%. Echo in 4/16 and revealed EF 55-60% with grade 1 DD and a PA peak pressure of 34 - Continue aspirin 325 - Continue metoprolol 51m70mD (written as 50 BID, however pt taking differently). Titrate as needed  - Holding lasix 20mg79mPRN (pt took 40mg 4mhis AM, as he does every morning) - Hold urocit-K while not taking lasix  T2DM:   - Januvia 100mg d87m (will switch to tradjenta, formulary) - monitor CBGs - continue Lyrica  HLD -Continue Lipitor 80mg  -38mat lipid panel this AM to assess compliance/response.   H/O alcohol abuse: In remission for several years. Patient continues to have a problem with prescribed narcotics, per his wife, who locks them away. - Try to avoid narcotics in this patient   GERD Stable.  - Continue protonix  History of PE: Noted on CTA on 05/19/14. INR 2.07 - has been on warfarin, not always therapeutic  OSA:  - BiPAP qHS  Depression: Sees Randy Readling in psychiatry.  - Continue Prozac - continue trazodone qHS  FEN/GI: HH/Carb mod diet,  SLIV, Protonix Prophylaxis: SCDs for now  Disposition: Discharge pending neuro recs.  Subjective:  Patient reports that he is doing well this am.  He states that his speech is baseline this morning.  He reports chronic L sided weakness.  He denies any concerns this morning.  Reviewed MRI/MRA.    Objective: Temp:  [97.4 F (36.3 C)-98.4 F (36.9 C)] 98.2 F (36.8 C) (06/19 0245) Pulse Rate:  [66-75] 73 (06/19 0030) Resp:  [12-26] 16 (06/19 0245) BP: (99-131)/(36-91) 103/55 mmHg (06/19 0245) SpO2:  [91 %-100 %] 93 % (06/19 0245) Weight:  [219 lb 6.4 oz (99.519 kg)] 219 lb 6.4 oz (99.519 kg) (06/18 1500) Physical Exam: General: sleeping in bed, BiPap in place, NAD Cardiovascular: RRR, no murmurs Respiratory: CTAB, no increased WOB, no wheeze Abdomen: soft, NT/ND, +BS Extremities: WWP, no edema Neuro: EOMI,  AOx3, speech initially seemed somewhat slurred but patient drank water and this seemed to improve.  UE and LE 5/5 strength.  Slight weakness of RUE compared to LUE.  Sensation grossly in tact.  R sided end point dysmetria.    Laboratory:  Recent Labs Lab 02/11/15 0905 02/11/15 0913  WBC 7.4  --   HGB 12.3* 13.3  HCT 36.7* 39.0  PLT 224  --     Recent Labs Lab 02/11/15 0905 02/11/15 0913  NA 136 138  K 3.9 3.9   CL 101 100*  CO2 26  --   BUN 16 19  CREATININE 0.89 0.90  CALCIUM 9.0  --   PROT 7.4  --   BILITOT 0.5  --   ALKPHOS 80  --   ALT 22  --   AST 23  --   GLUCOSE 117* 115*   Imaging/Diagnostic Tests: Ct Head Wo Contrast  02/11/2015   CLINICAL DATA:  Acute onset of slurred speech and generalized weakness. Patient fell to knees. Patient on Coumadin. Initial encounter.  EXAM: CT HEAD WITHOUT CONTRAST  TECHNIQUE: Contiguous axial images were obtained from the base of the skull through the vertex without intravenous contrast.  COMPARISON:  CT of the head performed 04/01/2014, and MRI/MRA of the brain performed 11/14/2013  FINDINGS: There is no evidence of acute infarction, mass lesion, or intra- or extra-axial hemorrhage on CT.  Prominence of the ventricles suggests mild cortical volume loss. Cavum septum pellucidum is noted. A chronic infarct is noted at the high right frontoparietal region, with associated encephalomalacia. Chronic infarct extends inferiorly along the medial right frontal lobe. Small chronic lacunar infarcts are seen in the basal ganglia bilaterally. Mild periventricular Beg matter change likely reflects small vessel ischemic microangiopathy.  The brainstem and fourth ventricle are within normal limits. No mass effect or midline shift is seen.  There is no evidence of fracture; visualized osseous structures are unremarkable in appearance. The orbits are within normal limits. The paranasal sinuses and mastoid air cells are well-aerated. No significant soft tissue abnormalities are seen.  IMPRESSION: 1. No acute intracranial pathology seen on CT. 2. Mild cortical volume loss and scattered small vessel ischemic microangiopathy. 3. Chronic infarct at the high right frontoparietal region, with associated encephalomalacia. Chronic infarct extends inferiorly along the medial right frontal lobe. 4. Small chronic lacunar infarcts at the basal ganglia bilaterally.   Electronically Signed   By:  Garald Balding M.D.   On: 02/11/2015 09:41   Mr Angiogram Head Wo Contrast  02/11/2015   CLINICAL DATA:  Stroke symptoms. Dysarthric speech. On Coumadin for atrial flutter and PE  EXAM: MRI HEAD WITHOUT CONTRAST  MRA HEAD WITHOUT CONTRAST  TECHNIQUE: Multiplanar, multiecho pulse sequences of the brain and surrounding structures were obtained without intravenous contrast. Angiographic images of the head were obtained using MRA technique without contrast.  COMPARISON:  MRI 11/14/2013  FINDINGS: MRI HEAD FINDINGS  Acute infarct in the high right frontal lobe over the convexity. The prior MRI demonstrated acute infarct in this territory and there appears to be extension of this infarct which is in the right anterior cerebral artery territory. No other area of acute infarct.  Ventricular enlargement is stable and consistent with atrophy. Chronic microvascular ischemic change in the Fournier matter. Chronic lacunar infarction in the left pons has developed since the prior study.  Negative for mass or edema.  No shift of the midline structures.  Mild chronic hemorrhage in the chronic right frontal lobe infarct.  MRA HEAD FINDINGS  Both vertebral arteries patent to the basilar. PICA patent bilaterally. Basilar widely patent. Severe stenosis left posterior cerebral artery unchanged from the prior study. Mild irregularity right posterior cerebral artery  Atherosclerotic irregularity in the cavernous carotid bilaterally without significant stenosis. Anterior and middle cerebral arteries widely patent without significant stenosis.  Negative for cerebral aneurysm  IMPRESSION: Chronic right ACA infarct in the right frontal lobe. There is now a small area of acute infarct in the right frontal cortex over the convexity compatible with extension of infarction.  Atrophy and chronic microvascular ischemic changes. Chronic infarct left pons has developed since 2015.  Severe stenosis left posterior cerebral artery is unchanged from the  prior study. Right anterior cerebral artery is widely patent.   Electronically Signed   By: Franchot Gallo M.D.   On: 02/11/2015 12:58   Mr Brain Wo Contrast  02/11/2015   CLINICAL DATA:  Stroke symptoms. Dysarthric speech. On Coumadin for atrial flutter and PE  EXAM: MRI HEAD WITHOUT CONTRAST  MRA HEAD WITHOUT CONTRAST  TECHNIQUE: Multiplanar, multiecho pulse sequences of the brain and surrounding structures were obtained without intravenous contrast. Angiographic images of the head were obtained using MRA technique without contrast.  COMPARISON:  MRI 11/14/2013  FINDINGS: MRI HEAD FINDINGS  Acute infarct in the high right frontal lobe over the convexity. The prior MRI demonstrated acute infarct in this territory and there appears to be extension of this infarct which is in the right anterior cerebral artery territory. No other area of acute infarct.  Ventricular enlargement is stable and consistent with atrophy. Chronic microvascular ischemic change in the [place] matter. Chronic lacunar infarction in the left pons has developed since the prior study.  Negative for mass or edema.  No shift of the midline structures.  Mild chronic hemorrhage in the chronic right frontal lobe infarct.  MRA HEAD FINDINGS  Both vertebral arteries patent to the basilar. PICA patent bilaterally. Basilar widely patent. Severe stenosis left posterior cerebral artery unchanged from the prior study. Mild irregularity right posterior cerebral artery  Atherosclerotic irregularity in the cavernous carotid bilaterally without significant stenosis. Anterior and middle cerebral arteries widely patent without significant stenosis.  Negative for cerebral aneurysm  IMPRESSION: Chronic right ACA infarct in the right frontal lobe. There is now a small area of acute infarct in the right frontal cortex over the convexity compatible with extension of infarction.  Atrophy and chronic microvascular ischemic changes. Chronic infarct left pons has developed  since 2015.  Severe stenosis left posterior cerebral artery is unchanged from the prior study. Right anterior cerebral artery is widely patent.   Electronically Signed   By: Franchot Gallo M.D.   On: 02/11/2015 12:58   Janora Norlander, DO 02/12/2015, 6:19 AM PGY-1, [place] Intern pager: [phone], text pages welcome

## 2015-02-12 NOTE — Progress Notes (Signed)
Utilization review completed.  

## 2015-02-13 ENCOUNTER — Inpatient Hospital Stay (HOSPITAL_COMMUNITY): Payer: Medicare Other

## 2015-02-13 DIAGNOSIS — R471 Dysarthria and anarthria: Secondary | ICD-10-CM

## 2015-02-13 DIAGNOSIS — I6350 Cerebral infarction due to unspecified occlusion or stenosis of unspecified cerebral artery: Secondary | ICD-10-CM

## 2015-02-13 LAB — GLUCOSE, CAPILLARY
GLUCOSE-CAPILLARY: 111 mg/dL — AB (ref 65–99)
GLUCOSE-CAPILLARY: 123 mg/dL — AB (ref 65–99)
Glucose-Capillary: 102 mg/dL — ABNORMAL HIGH (ref 65–99)

## 2015-02-13 LAB — CBC
HCT: 37 % — ABNORMAL LOW (ref 39.0–52.0)
Hemoglobin: 12.2 g/dL — ABNORMAL LOW (ref 13.0–17.0)
MCH: 30.1 pg (ref 26.0–34.0)
MCHC: 33 g/dL (ref 30.0–36.0)
MCV: 91.4 fL (ref 78.0–100.0)
PLATELETS: 213 10*3/uL (ref 150–400)
RBC: 4.05 MIL/uL — AB (ref 4.22–5.81)
RDW: 13.1 % (ref 11.5–15.5)
WBC: 6.8 10*3/uL (ref 4.0–10.5)

## 2015-02-13 LAB — HEMOGLOBIN A1C
Hgb A1c MFr Bld: 7.1 % — ABNORMAL HIGH (ref 4.8–5.6)
Mean Plasma Glucose: 157 mg/dL

## 2015-02-13 LAB — PROTIME-INR
INR: 1.67 — ABNORMAL HIGH (ref 0.00–1.49)
Prothrombin Time: 19.7 seconds — ABNORMAL HIGH (ref 11.6–15.2)

## 2015-02-13 MED ORDER — ASPIRIN 81 MG PO TBEC: 81.0000 mg | DELAYED_RELEASE_TABLET | Freq: Every day | ORAL | Status: DC

## 2015-02-13 MED ORDER — METOPROLOL TARTRATE 50 MG PO TABS: 25.0000 mg | ORAL_TABLET | Freq: Two times a day (BID) | ORAL | Status: DC

## 2015-02-13 MED ORDER — APIXABAN 5 MG PO TABS: 5.0000 mg | ORAL_TABLET | Freq: Two times a day (BID) | ORAL | Status: DC

## 2015-02-13 MED ORDER — APIXABAN 5 MG PO TABS
5.0000 mg | ORAL_TABLET | Freq: Two times a day (BID) | ORAL | Status: DC
  Administered 2015-02-13: 5 mg via ORAL
  Filled 2015-02-13: qty 1

## 2015-02-13 MED ORDER — ASPIRIN EC 81 MG PO TBEC
81.0000 mg | DELAYED_RELEASE_TABLET | Freq: Every day | ORAL | Status: DC
  Administered 2015-02-13: 81 mg via ORAL
  Filled 2015-02-13: qty 1

## 2015-02-13 NOTE — Progress Notes (Signed)
Physical Therapy Treatment Patient Details Name: [name] MRN: [id number] DOB: [birth year] Today's Date: 02/13/2015    History of Present Illness Pt is a 69 y/o male with a PMH of CVA who presented to the ED with dysarthria and generalized weakness. Per his wife, he was in his normal state of health at bed time (11pm) and she noted slurred speech when she was awoken around 3:45am when she heard him fall on his buttocks. Per the pt, he did not injury himself or hit his head.  Tests reveal extension of prior Rt ACA infarct. Pt also has chronic basal ganglia lacunar infarcts.    PT Comments    Patient progressing well with mobility. Demonstrates modest deficits in balance and pacing throughout session but able to make self corrections without assist. Patient was able to perform stair negotiation but required increased time. Current POC remains appropriate.   Follow Up Recommendations  Home health PT;Supervision/Assistance - 24 hour     Equipment Recommendations  None recommended by PT    Recommendations for Other Services       Precautions / Restrictions Precautions Precautions: Fall;Other (comment) Precaution Comments: monitor Oxygen saturation Restrictions Weight Bearing Restrictions: No    Mobility  Bed Mobility Overal bed mobility: Needs Assistance Bed Mobility: Supine to Sit     Supine to sit: Modified independent (Device/Increase time) (heavy use of bed rails)     General bed mobility comments: increased time to perform with use of bedrails, no physical assist required  Transfers Overall transfer level: Needs assistance Equipment used: None Transfers: Sit to/from Stand Sit to Stand: Supervision            Ambulation/Gait Ambulation/Gait assistance: Supervision Ambulation Distance (Feet): 210 Feet Assistive device: None Gait Pattern/deviations: Decreased step length - right;Decreased step length - left;WFL(Within Functional Limits) Gait velocity:  decreased Gait velocity interpretation: Below normal speed for age/gender General Gait Details: 1 standing rest break, modest instability but able to self correct, no physical assist required   Stairs Stairs: Yes Stairs assistance: Supervision Stair Management: One rail Right;Forwards Number of Stairs: 5 General stair comments: patient able to perform stairs without physical assist but did required increased time  Wheelchair Mobility    Modified Rankin (Stroke Patients Only)       Balance Overall balance assessment: Needs assistance Sitting-balance support: Feet supported       Standing balance support: No upper extremity supported Standing balance-Leahy Scale: Fair                      Cognition Arousal/Alertness: Awake/alert Behavior During Therapy: WFL for tasks assessed/performed Overall Cognitive Status: Within Functional Limits for tasks assessed       Memory: Decreased short-term memory              Exercises      General Comments        Pertinent Vitals/Pain Pain Assessment: 0-10 Pain Score: 4  Pain Location: headache Pain Descriptors / Indicators: Headache Pain Intervention(s): Monitored during session;Repositioned;Relaxation    Home Living                      Prior Function            PT Goals (current goals can now be found in the care plan section) Acute Rehab PT Goals Patient Stated Goal: to go home PT Goal Formulation: With patient/family Time For Goal Achievement: 02/20/15 Potential to Achieve Goals: Good Progress towards PT goals:  Progressing toward goals    Frequency  Min 4X/week    PT Plan Current plan remains appropriate    Co-evaluation             End of Session Equipment Utilized During Treatment: Gait belt Activity Tolerance: Patient limited by fatigue Patient left: in bed;with call bell/phone within reach;with bed alarm set;with SCD's reapplied     Time: 0712-5247 PT Time Calculation  (min) (ACUTE ONLY): 19 min  Charges:  $Gait Training: 8-22 mins                    G CodesDuncan Dull 15-Mar-2015, 2:11 PM Alben Deeds, [place] DPT  [phone]

## 2015-02-13 NOTE — Progress Notes (Signed)
Patient signed paperwork, IV removed, and questions answered. Family went home to get portable oxygen tank. Patient dressed and belongings packed. Will continue to monitor until discharge. Mattie Novosel, Rande Brunt, RN

## 2015-02-13 NOTE — Evaluation (Signed)
Occupational Therapy Evaluation Patient Details Name: [name] MRN: [id number] DOB: 05-07-1946 Today's Date: 02/13/2015    History of Present Illness Pt is a 69 y/o male with a PMH of CVA who presented to the ED with dysarthria and generalized weakness. Per his wife, he was in his normal state of health at bed time (11pm) and she noted slurred speech when she was awoken around 3:45am when she heard him fall on his buttocks. Per the pt, he did not injury himself or hit his head.  Tests reveal extension of prior Rt ACA infarct. Pt also has chronic basal ganglia lacunar infarcts.   Clinical Impression   Pt 95% on RA. Pt fatigues easily; but reports feeling "back to normal". Pt is HOH and reports wearing hearing aids; none to be found in room. Pt would benefit from acute OT to return to PLOF and increase safety and independence with ADL completion. Pt reports needing A at home with tub transfer; and requests Door OT.    Follow Up Recommendations  Home health OT;Supervision/Assistance - 24 hour    Equipment Recommendations       Recommendations for Other Services       Precautions / Restrictions Precautions Precautions: Fall;Other (comment) Precaution Comments: monitor Oxygen saturation Restrictions Weight Bearing Restrictions: No      Mobility Bed Mobility Overal bed mobility: Needs Assistance Bed Mobility: Supine to Sit     Supine to sit: Modified independent (Device/Increase time) (heavy use of bed rails)        Transfers Overall transfer level: Needs assistance Equipment used: Rolling walker (2 wheeled) Transfers: Sit to/from Stand Sit to Stand: Supervision              Balance Overall balance assessment: Needs assistance Sitting-balance support: Feet supported;No upper extremity supported       Standing balance support: No upper extremity supported;During functional activity                                ADL Overall ADL's : Needs  assistance/impaired Eating/Feeding: Independent;Sitting   Grooming: Wash/dry hands;Wash/dry face;Supervision/safety;Standing   Upper Body Bathing: Supervision/ safety;Sitting   Lower Body Bathing: Supervison/ safety;Sit to/from stand   Upper Body Dressing : Modified independent;Sitting   Lower Body Dressing: Supervision/safety;Sit to/from stand   Toilet Transfer: Min guard;Ambulation;Regular Toilet;RW   Toileting- Clothing Manipulation and Hygiene: Supervision/safety;Sit to/from stand   Tub/ Shower Transfer: Tub Mining engineer Details (indicate cue type and reason): Pt reports A needed with tub transfer at home Functional mobility during ADLs: Min guard;Rolling walker General ADL Comments: Pt able to complete ADLs with supervison; A needed for transfers     Vision Vision Assessment?: No apparent visual deficits   Perception     Praxis      Pertinent Vitals/Pain Pain Assessment: No/denies pain     Hand Dominance Left   Extremity/Trunk Assessment Upper Extremity Assessment Upper Extremity Assessment: Overall WFL for tasks assessed   Lower Extremity Assessment Lower Extremity Assessment: Overall WFL for tasks assessed   Cervical / Trunk Assessment Cervical / Trunk Assessment: Kyphotic   Communication Communication Communication: HOH (Pt wears hearing aids; not in room)   Cognition Arousal/Alertness: Awake/alert Behavior During Therapy: WFL for tasks assessed/performed Overall Cognitive Status: Within Functional Limits for tasks assessed       Memory: Decreased short-term memory             General Comments  Exercises       Shoulder Instructions      Home Living Family/patient expects to be discharged to:: Private residence Living Arrangements: Spouse/significant other Available Help at Discharge: Family;Available 24 hours/day Type of Home: House Home Access: Level entry;Other (comment)     Home  Layout: Multi-level;Bed/bath upstairs Alternate Level Stairs-Number of Steps: 9 Alternate Level Stairs-Rails: Can reach both;None Bathroom Shower/Tub: Tub/shower unit Shower/tub characteristics: Door Bathroom Toilet: Handicapped height     Home Equipment: Environmental consultant - 2 wheels;Electric scooter          Prior Functioning/Environment Level of Independence: Needs assistance  Gait / Transfers Assistance Needed: used 2 wheeled RW outdoors, did not use device in home   Communication / Swallowing Assistance Needed: independent      OT Diagnosis: Generalized weakness   OT Problem List: Decreased strength;Decreased activity tolerance;Impaired balance (sitting and/or standing);Decreased safety awareness;Decreased knowledge of use of DME or AE   OT Treatment/Interventions: Self-care/ADL training;Therapeutic exercise;DME and/or AE instruction;Therapeutic activities;Patient/family education;Balance training    OT Goals(Current goals can be found in the care plan section) Acute Rehab OT Goals Patient Stated Goal: to go home OT Goal Formulation: With patient Time For Goal Achievement: 02/27/15 Potential to Achieve Goals: Good ADL Goals Pt Will Perform Grooming: with modified independence;standing Pt Will Perform Lower Body Dressing: with modified independence;with adaptive equipment;sit to/from stand Pt Will Perform Tub/Shower Transfer: Tub transfer;with min guard assist;ambulating;rolling walker  OT Frequency: Min 2X/week   Barriers to D/C:            Co-evaluation              End of Session Equipment Utilized During Treatment: Gait belt;Rolling walker Nurse Communication: Mobility status  Activity Tolerance: Patient tolerated treatment well Patient left: in chair;with call bell/phone within reach;with chair alarm set   Time: 9937-1696 OT Time Calculation (min): 27 min Charges:  OT General Charges $OT Visit: 1 Procedure OT Evaluation $Initial OT Evaluation Tier I: 1  Procedure OT Treatments $Self Care/Home Management : 8-22 mins G-Codes:    Forest Gleason 02/13/2015, 9:14 AM

## 2015-02-13 NOTE — Progress Notes (Signed)
VASCULAR LAB PRELIMINARY  PRELIMINARY  PRELIMINARY  PRELIMINARY  Carotid duplex completed.    Preliminary report:  Bilateral:  1-39% ICA stenosis.  Vertebral artery flow is antegrade.     Kennice Finnie, RVS 02/13/2015, 11:16 AM

## 2015-02-13 NOTE — Progress Notes (Signed)
STROKE TEAM PROGRESS NOTE   HISTORY [name] is a 69 y.o. male with a history of previous stroke as well as seizures as well as atrial flutter who is on anticoagulation due to previous PEs who presents with dysarthria that was present on awakening this morning. He states that he did have a subtherapeutic INR about 2 weeks ago of 1.5. This was checked while they were doing a tooth cleaning. In the ED he denies any new weakness or numbness just states that he is having difficulty with speech. He was LKW: 6/17 prior to bed. tpa was not given as out of window.    SUBJECTIVE (INTERVAL HISTORY) Patient up in chair at bedside. No family present.    OBJECTIVE Temp:  [97.6 F (36.4 C)-98.7 F (37.1 C)] 98 F (36.7 C) (06/20 0540) Pulse Rate:  [69-75] 72 (06/20 0540) Cardiac Rhythm:  [-] Normal sinus rhythm (06/20 0759) Resp:  [18-20] 20 (06/20 0540) BP: (116-126)/(60-64) 124/60 mmHg (06/20 0540) SpO2:  [96 %-98 %] 97 % (06/20 0540)   Recent Labs Lab 02/11/15 2349 02/12/15 0558 02/12/15 1147 02/13/15 0006 02/13/15 0610  GLUCAP 127* 173* 139* 111* 123*    Recent Labs Lab 02/11/15 0905 02/11/15 0913  NA 136 138  K 3.9 3.9  CL 101 100*  CO2 26  --   GLUCOSE 117* 115*  BUN 16 19  CREATININE 0.89 0.90  CALCIUM 9.0  --     Recent Labs Lab 02/11/15 0905  AST 23  ALT 22  ALKPHOS 80  BILITOT 0.5  PROT 7.4  ALBUMIN 3.7    Recent Labs Lab 02/11/15 0905 02/11/15 0913 02/13/15 0630  WBC 7.4  --  6.8  NEUTROABS 5.1  --   --   HGB 12.3* 13.3 12.2*  HCT 36.7* 39.0 37.0*  MCV 91.1  --  91.4  PLT 224  --  213   No results for input(s): CKTOTAL, CKMB, CKMBINDEX, TROPONINI in the last 168 hours.  Recent Labs  02/11/15 0905 02/13/15 0630  LABPROT 23.2* 19.7*  INR 2.07* 1.67*    Recent Labs  02/11/15 0838  COLORURINE YELLOW  LABSPEC 1.009  PHURINE 5.5  GLUCOSEU NEGATIVE  HGBUR NEGATIVE  BILIRUBINUR NEGATIVE  KETONESUR NEGATIVE  PROTEINUR NEGATIVE   UROBILINOGEN 0.2  NITRITE NEGATIVE  LEUKOCYTESUR NEGATIVE       Component Value Date/Time   CHOL 195 02/12/2015 0550   TRIG 431* 02/12/2015 0550   HDL 33* 02/12/2015 0550   CHOLHDL 5.9 02/12/2015 0550   VLDL UNABLE TO CALCULATE IF TRIGLYCERIDE OVER 400 mg/dL 02/12/2015 0550   LDLCALC UNABLE TO CALCULATE IF TRIGLYCERIDE OVER 400 mg/dL 02/12/2015 0550   Lab Results  Component Value Date   HGBA1C 7.1* 02/12/2015      Component Value Date/Time   LABOPIA POSITIVE* 11/14/2013 1723   LABOPIA POSITIVE* 04/03/2012 0910   COCAINSCRNUR NONE DETECTED 11/14/2013 1723   COCAINSCRNUR NEGATIVE 04/03/2012 0910   LABBENZ NONE DETECTED 11/14/2013 1723   LABBENZ NEGATIVE 04/03/2012 0910   AMPHETMU NONE DETECTED 11/14/2013 1723   AMPHETMU NEGATIVE 04/03/2012 0910   THCU NONE DETECTED 11/14/2013 1723   LABBARB NONE DETECTED 11/14/2013 1723    No results for input(s): ETH in the last 168 hours.    Ct Head Wo Contrast 02/11/2015    1. No acute intracranial pathology seen on CT.  2. Mild cortical volume loss and scattered small vessel ischemic microangiopathy.  3. Chronic infarct at the high right frontoparietal region, with associated encephalomalacia.  Chronic infarct extends inferiorly along the medial right frontal lobe.  4. Small chronic lacunar infarcts at the basal ganglia bilaterally.      Mri & Mra Head Wo Contrast 02/11/2015     Chronic right ACA infarct in the right frontal lobe.   Small area of acute infarct in the right frontal cortex over the convexity compatible with extension of infarction.    Atrophy and chronic microvascular ischemic changes.   Chronic infarct left pons has developed since 2015.    Severe stenosis left posterior cerebral artery is unchanged from the prior study.   Right anterior cerebral artery is widely patent.     Carotid Doppler  There is 1-39% bilateral ICA stenosis. Vertebral artery flow is antegrade.     PHYSICAL EXAM GENERAL: Pleasant.   HEENT: normal ABDOMEN: soft EXTREMITIES: No edema  BACK: normal SKIN: Normal by inspection. MENTAL STATUS: Alert and oriented. Speech - subtle dysarthria, language and cognition are generally intact. Judgment and insight normal.  CRANIAL NERVES: Pupils are equal, round and reactive to light and accomodation; extra ocular movements are full, there is no significant nystagmus; visual fields are full; upper and lower facial muscles are normal in strength and symmetric, there is no flattening of the nasolabial folds; tongue is midline; uvula is midline; shoulder elevation is normal. MOTOR: Normal tone, bulk and strength; no pronator drift. COORDINATION: Left finger to nose is normal, right finger to nose is normal, No rest tremor; no intention tremor; no postural tremor; no bradykinesia. SENSATION: Normal to light touch and temperature   ASSESSMENT/PLAN [name] is a 68 y.o. male with history of a previous right ACA infarct, seizure history, hyperlipidemia, hypertension, atrial flutter, pulmonary embolus history, Coumadin therapy, and diabetes mellitus, presenting with dysarthria. He did not receive IV t-PA due to late presentation.   Stroke/TIA:  Possibly Incidental Non-dominant infarct felt to be embolic secondary to atrial flutter vs TIA (per Dr. Clydene Fake reading, radiology findings not definitive, not convinced it is a stroke and diffusion abnormality may represent laminar necrosis)  Resultant  Mild dysarthria  MRI  small area of acute infarct in the right frontal cortex   MRA  Severe stenosis left posterior cerebral artery is unchanged from the prior study.   Carotid Doppler  No significant stenosis   2D Echo  12/02/2014 - EF 55-60%.  No cardiac source of emboli identified.  LDL - unable to calculate due to triglycerides of 431  HgbA1c 7.1  warfarin for VTE prophylaxis Diet heart healthy/carb modified Room service appropriate?: Yes; Fluid consistency:: Thin  aspirin 325  mg orally every day and warfarin prior to admission, INR on arrival 2.07, ordered to start [facility] 02/13/15. INR now 1.67. Pharmacy following for transition. No indication for aspirin in addition to Eliquis from the stroke standpoint  Patient counseled to be compliant with his antithrombotic medications  Ongoing aggressive stroke risk factor management  Therapy recommendations:  HH OT, HH PT  Disposition:  Home with therapies  NOTHING FURTHER TO ADD FROM THE STROKE STANDPOINT Patient has a 10-15% risk of having another stroke over the next year, the highest risk is within 2 weeks of the most recent stroke/TIA (risk of having a stroke following a stroke or TIA is the same). Ongoing risk factor control by Primary Care Physician Stroke Service will sign off. Please call should any needs arise. Follow-up Stroke Clinic at [facility] with Dr. Antony Contras in 2 months, order placed.  Hypertension  Home meds: Lopressor  stable  Hyperlipidemia  Home meds:  Lipitor 80 mg daily resumed in hospital  LDL unable to calculate due to high TG, goal < 70  Continue statin at discharge  Diabetes type II  HgbA1c 7.1, goal < 7.0  Controlled  Other Stroke Risk Factors  Advanced age  Narcotic use/abuse  ETOH use  Obesity, Body mass index is 34.35 kg/(m^2).   Hx stroke/TIA  Family hx stroke (grandmother)  Coronary artery disease - CABG 2005  Obstructive sleep apnea, CPAP at hs  Other Active Problems  GERD  Hx PE on CTA 05/19/2014   Depression  Other Pertinent History  Patient has loop recorder  Hospital day # [place] for Pager information 02/13/2015 2:30 PM  I have personally examined this patient, reviewed notes, independently viewed imaging studies, participated in medical decision making and plan of care. I have made any additions or clarifications directly to the above note. Agree with note above.  The  patient denies any new focal symptoms suggestive of a new stroke and MRI diffusion abnormality in my opinion may represent laminar necrosis following old ACA infarct hence I do not believe any further stroke workup is necessary. Kindly call for questions. Stroke team will sign off. Kindly call for questions.  Antony Contras, MD Medical Director [facility] Pager: [phone] 02/13/2015 4:00 PM   To contact Stroke Continuity provider, please refer to [email or web address] After hours, contact General Neurology

## 2015-02-13 NOTE — Progress Notes (Signed)
Family Medicine Teaching Service Daily Progress Note Intern Pager: [phone]  Patient name: [name] Medical record number: [id number] Date of birth: 25-Apr-1946 Age: 69 y.o. Gender: male  Primary Care Provider: [name], MD Consultants: neurology Code Status: FULL (discussed on admission)  Pt Overview and Major Events to Date:  06/18: Admitted  Assessment and Plan: [name] is a 69 y.o. male presenting with dysarthria. PMH is significant for CVA, CAD, DM2, asbestosis, seizure disorder, HTN, HLD, OSA, chronic back pain, EtOH abuse, and dysphage.   CVA: Patient presenting with dysarthria and generalized weakness. He has a h/o of CVAs x 3- he was started on pradax after his 1st CVA and developed a PE, at which time he was started on warfarin. CT of head was negative, however MRI/MRA revealed Chronic right ACA infarct in the right frontal lobe with a small area of acute infarct in the right frontal cortex over the convexity compatible with extension of infarction. Oddly, the R anterior cerebral artery was patent, therefore unsure if this was secondary to embolus from atrial flutter. EKG 6/18: sinus with borderline prolonged QT. - telemetry - Neurology consulted, appreciate recs -- hold coumadin, use ASA 328m. Would appreciate recs from Neuro as to when to add antiplatelet such as ASA - Will not repeat echo as last was 4/16 and revealed EF 55-60% with grade 1 DD and a PA peak pressure of 34 - Will obtain carotid dopplers, as last was in 2015 - Hgb A1c pending - Atorvastatin 859m unable to calculate LDL due to significantly elevated TG to 431 this admission - PT: HH-PT, 24hr supervision (pt states wife is home unless she's running errands). - OT recs pending  CAD H/o CABG 2005, stress test 10/26/12 showing low risk with small area of decreased uptake at base of inferior wall; EF 62%. Echo in 4/16 and revealed EF 55-60% with grade 1 DD and a PA peak pressure of 34 - Continue aspirin  325 - Continue metoprolol 2534mID (written as 50 BID, however pt taking differently). Titrate as needed  - Holding lasix 16m24m PRN (pt took 40mg58mthis AM, as he does every morning)  - Hold urocit-K while not taking lasix  T2DM:  - Alc pending  - Januvia 100mg 52my (will switch to tradjenta, formulary) - Given CBGs in 100s, will stop monitoring q6hrs  - continue Lyrica  HLD -Continue Lipitor 80mg  50m alcohol abuse: In remission for several years. Patient continues to have a problem with prescribed narcotics, per his wife, who locks them away. - Try to avoid narcotics in this patient   GERD Stable.  - Continue protonix  History of PE: Noted on CTA on 05/19/14. INR 2.07 - has been on warfarin, not always therapeutic - will defer to neurology concerning appropriate anticoagulation.  OSA:  - BiPAP qHS  Depression: Sees Randy Readling in psychiatry.  - Continue Prozac - continue trazodone qHS  FEN/GI: HH/Carb mod diet,  SLIV, Protonix Prophylaxis: SCDs for now  Disposition: Discharge pending neuro recs.  Subjective:  Patient reports that he is doing well this am.  Speech is baseline this AM. At his baseline for L LE weakness.  Objective: Temp:  [97.6 F (36.4 C)-98.7 F (37.1 C)] 98 F (36.7 C) (06/20 0540) Pulse Rate:  [68-75] 72 (06/20 0540) Resp:  [18-20] 20 (06/20 0540) BP: (110-126)/(53-64) 124/60 mmHg (06/20 0540) SpO2:  [93 %-98 %] 97 % (06/20 0540) Physical Exam: General: Sitting up in bedside  chair, in NAD. Cardiovascular: RRR, no murmurs, rubs, or gallops. Respiratory: CTAB, no increased WOB, no wheeze Abdomen: soft, NT/ND, +BS Extremities: WWP, no edema Neuro: EOMI, AOx4, Speech clear, appropriate tone and rate. No word finding problems. 5/5 strength in UE and LE b/l, however slight weakness of RUE compared to LUE.  Sensation grossly in tact.  End point dysmetria slightly improved.  Laboratory:  Recent Labs Lab 02/11/15 0905 02/11/15 0913  02/13/15 0630  WBC 7.4  --  6.8  HGB 12.3* 13.3 12.2*  HCT 36.7* 39.0 37.0*  PLT 224  --  213    Recent Labs Lab 02/11/15 0905 02/11/15 0913  NA 136 138  K 3.9 3.9  CL 101 100*  CO2 26  --   BUN 16 19  CREATININE 0.89 0.90  CALCIUM 9.0  --   PROT 7.4  --   BILITOT 0.5  --   ALKPHOS 80  --   ALT 22  --   AST 23  --   GLUCOSE 117* 115*   Imaging/Diagnostic Tests: Ct Head Wo Contrast  02/11/2015   CLINICAL DATA:  Acute onset of slurred speech and generalized weakness. Patient fell to knees. Patient on Coumadin. Initial encounter.  EXAM: CT HEAD WITHOUT CONTRAST  TECHNIQUE: Contiguous axial images were obtained from the base of the skull through the vertex without intravenous contrast.  COMPARISON:  CT of the head performed 04/01/2014, and MRI/MRA of the brain performed 11/14/2013  FINDINGS: There is no evidence of acute infarction, mass lesion, or intra- or extra-axial hemorrhage on CT.  Prominence of the ventricles suggests mild cortical volume loss. Cavum septum pellucidum is noted. A chronic infarct is noted at the high right frontoparietal region, with associated encephalomalacia. Chronic infarct extends inferiorly along the medial right frontal lobe. Small chronic lacunar infarcts are seen in the basal ganglia bilaterally. Mild periventricular Zarrella matter change likely reflects small vessel ischemic microangiopathy.  The brainstem and fourth ventricle are within normal limits. No mass effect or midline shift is seen.  There is no evidence of fracture; visualized osseous structures are unremarkable in appearance. The orbits are within normal limits. The paranasal sinuses and mastoid air cells are well-aerated. No significant soft tissue abnormalities are seen.  IMPRESSION: 1. No acute intracranial pathology seen on CT. 2. Mild cortical volume loss and scattered small vessel ischemic microangiopathy. 3. Chronic infarct at the high right frontoparietal region, with associated  encephalomalacia. Chronic infarct extends inferiorly along the medial right frontal lobe. 4. Small chronic lacunar infarcts at the basal ganglia bilaterally.   Electronically Signed   By: Garald Balding M.D.   On: 02/11/2015 09:41   Mr Angiogram Head Wo Contrast  02/11/2015   CLINICAL DATA:  Stroke symptoms. Dysarthric speech. On Coumadin for atrial flutter and PE  EXAM: MRI HEAD WITHOUT CONTRAST  MRA HEAD WITHOUT CONTRAST  TECHNIQUE: Multiplanar, multiecho pulse sequences of the brain and surrounding structures were obtained without intravenous contrast. Angiographic images of the head were obtained using MRA technique without contrast.  COMPARISON:  MRI 11/14/2013  FINDINGS: MRI HEAD FINDINGS  Acute infarct in the high right frontal lobe over the convexity. The prior MRI demonstrated acute infarct in this territory and there appears to be extension of this infarct which is in the right anterior cerebral artery territory. No other area of acute infarct.  Ventricular enlargement is stable and consistent with atrophy. Chronic microvascular ischemic change in the Hepner matter. Chronic lacunar infarction in the left pons has developed since  the prior study.  Negative for mass or edema.  No shift of the midline structures.  Mild chronic hemorrhage in the chronic right frontal lobe infarct.  MRA HEAD FINDINGS  Both vertebral arteries patent to the basilar. PICA patent bilaterally. Basilar widely patent. Severe stenosis left posterior cerebral artery unchanged from the prior study. Mild irregularity right posterior cerebral artery  Atherosclerotic irregularity in the cavernous carotid bilaterally without significant stenosis. Anterior and middle cerebral arteries widely patent without significant stenosis.  Negative for cerebral aneurysm  IMPRESSION: Chronic right ACA infarct in the right frontal lobe. There is now a small area of acute infarct in the right frontal cortex over the convexity compatible with extension of  infarction.  Atrophy and chronic microvascular ischemic changes. Chronic infarct left pons has developed since 2015.  Severe stenosis left posterior cerebral artery is unchanged from the prior study. Right anterior cerebral artery is widely patent.   Electronically Signed   By: Franchot Gallo M.D.   On: 02/11/2015 12:58   Mr Brain Wo Contrast  02/11/2015   CLINICAL DATA:  Stroke symptoms. Dysarthric speech. On Coumadin for atrial flutter and PE  EXAM: MRI HEAD WITHOUT CONTRAST  MRA HEAD WITHOUT CONTRAST  TECHNIQUE: Multiplanar, multiecho pulse sequences of the brain and surrounding structures were obtained without intravenous contrast. Angiographic images of the head were obtained using MRA technique without contrast.  COMPARISON:  MRI 11/14/2013  FINDINGS: MRI HEAD FINDINGS  Acute infarct in the high right frontal lobe over the convexity. The prior MRI demonstrated acute infarct in this territory and there appears to be extension of this infarct which is in the right anterior cerebral artery territory. No other area of acute infarct.  Ventricular enlargement is stable and consistent with atrophy. Chronic microvascular ischemic change in the Raver matter. Chronic lacunar infarction in the left pons has developed since the prior study.  Negative for mass or edema.  No shift of the midline structures.  Mild chronic hemorrhage in the chronic right frontal lobe infarct.  MRA HEAD FINDINGS  Both vertebral arteries patent to the basilar. PICA patent bilaterally. Basilar widely patent. Severe stenosis left posterior cerebral artery unchanged from the prior study. Mild irregularity right posterior cerebral artery  Atherosclerotic irregularity in the cavernous carotid bilaterally without significant stenosis. Anterior and middle cerebral arteries widely patent without significant stenosis.  Negative for cerebral aneurysm  IMPRESSION: Chronic right ACA infarct in the right frontal lobe. There is now a small area of acute  infarct in the right frontal cortex over the convexity compatible with extension of infarction.  Atrophy and chronic microvascular ischemic changes. Chronic infarct left pons has developed since 2015.  Severe stenosis left posterior cerebral artery is unchanged from the prior study. Right anterior cerebral artery is widely patent.   Electronically Signed   By: Franchot Gallo M.D.   On: 02/11/2015 12:58    Archie Patten, MD 02/13/2015, 7:29 AM PGY-1, [place] Intern pager: [phone], text pages welcome

## 2015-02-13 NOTE — Care Management Note (Addendum)
Case Management Note  Patient Details  Name: [name] MRN: [id number] Date of Birth: 14-Mar-1946  Subjective/Objective:                  51 yoM admitted with stroke like sx discharged to Home with HHPT  Action/Plan: Referred to [facility] per request of pt and spouse; no further needs at present; please speak with spouse [name] as pt does not hear well on the phone.   Expected Discharge Date:                  Expected Discharge Plan:  [place]  In-House Referral:     Discharge planning Services  CM Consult  Post Acute Care Choice:    Choice offered to:  Patient  DME Arranged:    DME Agency:  [facility] Arranged:  PT, RN [facility] Agency:  [facility]  Status of Service:  Completed, signed off  Medicare Important Message Given:    Date Medicare IM Given:    Medicare IM give by:    Date Additional Medicare IM Given:    Additional Medicare Important Message give by:     If discussed at [place] of Stay Meetings, dates discussed:    Additional Comments:  Delrae Sawyers, RN 02/13/2015, 5:57 PM

## 2015-02-13 NOTE — Progress Notes (Addendum)
ANTICOAGULATION CONSULT NOTE - Initial Consult  Pharmacy Consult for Eliquis Indication: atrial fibrillation  Allergies  Allergen Reactions  . Cephalexin     Unknown  . Doxycycline     Unknown  . Keflex [Cephalexin]   . Lac Bovis Other (See Comments)    lactose intolerant  . Pentazocine Lactate     He passed out- he had a seizure.  This occurred around 2000  . Talwin [Pentazocine]   . Tape Other (See Comments)    Paper tape only please.  Freddi Starr [Tizanidine Hcl] Other (See Comments)    Lightheaded and dizzy    Patient Measurements: Height: 5' 7"  (170.2 cm) Weight: 219 lb 6.4 oz (99.519 kg) IBW/kg (Calculated) : 66.1 Heparin Dosing Weight:   Vital Signs: Temp: 98 F (36.7 C) (06/20 0540) Temp Source: Axillary (06/20 0540) BP: 124/60 mmHg (06/20 0540) Pulse Rate: 72 (06/20 0540)  Labs:  Recent Labs  02/11/15 0905 02/11/15 0913 02/13/15 0630  HGB 12.3* 13.3 12.2*  HCT 36.7* 39.0 37.0*  PLT 224  --  213  APTT 36  --   --   LABPROT 23.2*  --  19.7*  INR 2.07*  --  1.67*  CREATININE 0.89 0.90  --     Estimated Creatinine Clearance: 85.9 mL/min (by C-G formula based on Cr of 0.9).   Medical History: Past Medical History  Diagnosis Date  . Coronary artery disease     a. Low level exercise Lex MV 3/14: low risk, EF 62%, inf defect 2/2 diaph attenuation vs artifiact, small area of scar possible, no ischemia  . Hyperlipidemia   . Hypertension   . History of seizure disorder   . History of atrial flutter   . Depression   . Back pain     persistent  . Hearing loss   . Diverticulitis   . H/O alcohol abuse   . Diabetes mellitus     controled by diet  . GERD (gastroesophageal reflux disease)   . Seizures   . Headache(784.0)   . Arthritis     of spine  . Sleep apnea     does not use CPAP  . Hx of echocardiogram     Echo 7/14:  Mod LVH, EF 55-60%, Gr 1 DD, mild MR, PASP 36  . AKI (acute kidney injury)     a. 6/14: resolved after d/c ARB;  b. RA U/S  7/14: no RA stenosis  . Atrial flutter   . Stroke   . Shortness of breath   . Renal stone 04/15/2014    Medications:  Prescriptions prior to admission  Medication Sig Dispense Refill Last Dose  . atorvastatin (LIPITOR) 80 MG tablet Take 1 tablet (80 mg total) by mouth daily. (Patient taking differently: Take 80 mg by mouth at bedtime. ) 30 tablet 11 02/10/2015 at Unknown time  . FLUoxetine (PROZAC) 10 MG capsule Take 30 mg by mouth every morning.   02/11/2015 at Unknown time  . furosemide (LASIX) 20 MG tablet TAKE 1-2 TABLETS BY MOUTH EVERY DAY AS NEEDED (Patient taking differently: 40 mg DAILY in the morning (4am)) 60 tablet 3 02/11/2015 at Unknown time  . methocarbamol (ROBAXIN) 750 MG tablet Take 750 mg by mouth every 8 (eight) hours as needed for muscle spasms.    02/10/2015 at Unknown time  . metoprolol (LOPRESSOR) 50 MG tablet Take 1 tablet (50 mg total) by mouth 2 (two) times daily. (Patient taking differently: Take 25 mg by mouth 2 (two) times daily. )  60 tablet 3 02/11/2015 at 0400  . pantoprazole (PROTONIX) 40 MG tablet Take 40 mg by mouth 2 (two) times daily before a meal.   02/11/2015 at Unknown time  . potassium citrate (UROCIT-K) 10 MEQ (1080 MG) SR tablet Take 10 mEq by mouth 3 (three) times daily with meals.    02/11/2015 at Unknown time  . pregabalin (LYRICA) 50 MG capsule Take 50 mg by mouth 3 (three) times daily.    02/11/2015 at Unknown time  . sitaGLIPtin (JANUVIA) 100 MG tablet Take 100 mg by mouth every morning.    02/11/2015 at Unknown time  . traZODone (DESYREL) 150 MG tablet Take 150 mg by mouth at bedtime.   02/10/2015 at Unknown time  . warfarin (COUMADIN) 5 MG tablet Take as directed by anticoagulation clinic (Patient taking differently: Take 5-10 mg by mouth every evening. Take one tablet (86m) on Tuesdays, Fridays, and Sundays. On all other days of the week, take two tablets (132m.) 45 tablet 3 02/11/2015 at 0400  . glucose blood (ONE TOUCH ULTRA TEST) test strip    Taking   . nitroGLYCERIN (NITROSTAT) 0.4 MG SL tablet Place 1 tablet (0.4 mg total) under the tongue every 5 (five) minutes as needed. For chest pain 25 tablet 0 Never used  . ONETOUCH DELICA LANCETS FINE MISC    Taking   Scheduled:  . apixaban  5 mg Oral BID  . aspirin  325 mg Oral Daily  . atorvastatin  80 mg Oral QHS  . FLUoxetine  30 mg Oral q morning - 10a  . linagliptin  5 mg Oral Daily  . metoprolol  25 mg Oral BID  . pantoprazole  40 mg Oral BID AC  . pregabalin  50 mg Oral TID  . traZODone  150 mg Oral QHS   Infusions:    Assessment: [age]o male with history of stroke, hx of PE, seizures and aflutter on warfarin presents with dysarthria. Pt was on warfarin prior to admission, now transitioning to Eliquis since pt had stroke while therapeutic on warfarin. CBC stable, SCr 0.8, eCrCl ~ 7049min.  PTA Warfarin dose: 5mg28mes/Fri/Sun and 10mg90m other days   Goal of Therapy:  INR 2-3 Monitor platelets by anticoagulation protocol: Yes   Plan:  Eliquis 5 mg po bid Reduce dose of aspirin to 81 mg? Monitor for s/sx bleeding    AlisoHughes BetterrmD, BCPS Clinical Pharmacist Pager: [phone]/2016 12:09 PM

## 2015-02-13 NOTE — Progress Notes (Signed)
Patient transferred from ICU. Patient alert and oriented x 4. Patient oriented to room and made comfortable.

## 2015-02-13 NOTE — Progress Notes (Signed)
Please disregard note previously written on patient about admission. Was put in error

## 2015-02-13 NOTE — Discharge Instructions (Signed)
Ischemic Stroke A stroke (cerebrovascular accident) is the sudden death of brain tissue. It is a medical emergency. A stroke can cause permanent loss of brain function. This can cause problems with different parts of your body. A transient ischemic attack (TIA) is different because it does not cause permanent damage. A TIA is a short-lived problem of poor blood flow affecting a part of the brain. A TIA is also a serious problem because having a TIA greatly increases the chances of having a stroke. When symptoms first develop, you cannot know if the problem might be a stroke or a TIA. CAUSES  A stroke is caused by a decrease of oxygen supply to an area of your brain. It is usually the result of a small blood clot or collection of cholesterol or fat (plaque) that blocks blood flow in the brain. A stroke can also be caused by blocked or damaged carotid arteries.  RISK FACTORS  High blood pressure (hypertension).  High cholesterol.  Diabetes mellitus.  Heart disease.  The buildup of plaque in the blood vessels (peripheral artery disease or atherosclerosis).  The buildup of plaque in the blood vessels providing blood and oxygen to the brain (carotid artery stenosis).  An abnormal heart rhythm (atrial fibrillation).  Obesity.  Smoking.  Taking oral contraceptives (especially in combination with smoking).  Physical inactivity.  A diet high in fats, salt (sodium), and calories.  Alcohol use.  Use of illegal drugs (especially cocaine and methamphetamine).  Being African American.  Being over the age of 75.  Family history of stroke.  Previous history of blood clots, stroke, TIA, or heart attack.  Sickle cell disease. SYMPTOMS  These symptoms usually develop suddenly, or may be newly present upon awakening from sleep:  Sudden weakness or numbness of the face, arm, or leg, especially on one side of the body.  Sudden trouble walking or difficulty moving arms or legs.  Sudden  confusion.  Sudden personality changes.  Trouble speaking (aphasia) or understanding.  Difficulty swallowing.  Sudden trouble seeing in one or both eyes.  Double vision.  Dizziness.  Loss of balance or coordination.  Sudden severe headache with no known cause.  Trouble reading or writing. DIAGNOSIS  Your health care provider can often determine the presence or absence of a stroke based on your symptoms, history, and physical exam. Computed tomography (CT) of the brain is usually performed to confirm the stroke, determine causes, and determine stroke severity. Other tests may be done to find the cause of the stroke. These tests may include:  Electrocardiography.  Continuous heart monitoring.  Echocardiography.  Carotid ultrasonography.  Magnetic resonance imaging (MRI).  A scan of the brain circulation.  Blood tests. PREVENTION  The risk of a stroke can be decreased by appropriately treating high blood pressure, high cholesterol, diabetes, heart disease, and obesity and by quitting smoking, limiting alcohol, and staying physically active. TREATMENT  Time is of the essence. It is important to seek treatment at the first sign of these symptoms because you may receive a medicine to dissolve the clot (thrombolytic) that cannot be given if too much time has passed since your symptoms began. Even if you do not know when your symptoms began, get treatment as soon as possible as there are other treatment options available including oxygen, intravenous (IV) fluids, and medicines to thin the blood (anticoagulants). Treatment of stroke depends on the duration, severity, and cause of your symptoms. Medicines and dietary changes may be used to address diabetes, high blood  pressure, and other risk factors. Physical, speech, and occupational therapists will assess you and work with you to improve any functions impaired by the stroke. Measures will be taken to prevent short-term and long-term  complications, including infection from breathing foreign material into the lungs (aspiration pneumonia), blood clots in the legs, bedsores, and falls. Rarely, surgery may be needed to remove large blood clots or to open up blocked arteries. HOME CARE INSTRUCTIONS   Take medicines only as directed by your health care provider. Follow the directions carefully. Medicines may be used to control risk factors for a stroke. Be sure you understand all your medicine instructions.  You may be told to take a medicine to thin the blood, such as aspirin or the anticoagulant warfarin. Warfarin needs to be taken exactly as instructed.  Too much and too little warfarin are both dangerous. Too much warfarin increases the risk of bleeding. Too little warfarin continues to allow the risk for blood clots. While taking warfarin, you will need to have regular blood tests to measure your blood clotting time. These blood tests usually include both the PT and INR tests. The PT and INR results allow your health care provider to adjust your dose of warfarin. The dose can change for many reasons. It is critically important that you take warfarin exactly as prescribed, and that you have your PT and INR levels drawn exactly as directed.  Many foods, especially foods high in vitamin K, can interfere with warfarin and affect the PT and INR results. Foods high in vitamin K include spinach, kale, broccoli, cabbage, collard and turnip greens, brussels sprouts, peas, cauliflower, seaweed, and parsley, as well as beef and pork liver, green tea, and soybean oil. You should eat a consistent amount of foods high in vitamin K. Avoid major changes in your diet, or notify your health care provider before changing your diet. Arrange a visit with a dietitian to answer your questions.  Many medicines can interfere with warfarin and affect the PT and INR results. You must tell your health care provider about any and all medicines you take. This  includes all vitamins and supplements. Be especially cautious with aspirin and anti-inflammatory medicines. Do not take or discontinue any prescribed or over-the-counter medicine except on the advice of your health care provider or pharmacist.  Warfarin can have side effects, such as excessive bruising or bleeding. You will need to hold pressure over cuts for longer than usual. Your health care provider or pharmacist will discuss other potential side effects.  Avoid sports or activities that may cause injury or bleeding.  Be mindful when shaving, flossing your teeth, or handling sharp objects.  Alcohol can change the body's ability to handle warfarin. It is best to avoid alcoholic drinks or consume only very small amounts while taking warfarin. Notify your health care provider if you change your alcohol intake.  Notify your dentist or other health care providers before procedures.  If swallow studies have determined that your swallowing reflex is present, you should eat healthy foods. Including 5 or more servings of fruits and vegetables a day may reduce the risk of stroke. Foods may need to be a certain consistency (soft or pureed), or small bites may need to be taken in order to avoid aspirating or choking. Certain dietary changes may be advised to address high blood pressure, high cholesterol, diabetes, or obesity.  Food choices that are low in sodium, saturated fat, trans fat, and cholesterol are recommended to manage high blood pressure.  Food choies that are high in fiber, and low in saturated fat, trans fat, and cholesterol may control cholesterol levels.  Controlling carbohydrates and sugar intake is recommended to manage diabetes.  Reducing calorie intake and making food choices that are low in sodium, saturated fat, trans fat, and cholesterol are recommended to manage obesity.  Maintain a healthy weight.  Stay physically active. It is recommended that you get at least 30 minutes of  activity on all or most days.  Do not use any tobacco products including cigarettes, chewing tobacco, or electronic cigarettes.  Limit alcohol use even if you are not taking warfarin. Moderate alcohol use is considered to be:  No more than 2 drinks each day for men.  No more than 1 drink each day for nonpregnant women.  Home safety. A safe home environment is important to reduce the risk of falls. Your health care provider may arrange for specialists to evaluate your home. Having grab bars in the bedroom and bathroom is often important. Your health care provider may arrange for equipment to be used at home, such as raised toilets and a seat for the shower.  Physical, occupational, and speech therapy. Ongoing therapy may be needed to maximize your recovery after a stroke. If you have been advised to use a walker or a cane, use it at all times. Be sure to keep your therapy appointments.  Follow all instructions for follow-up with your health care provider. This is very important. This includes any referrals, physical therapy, rehabilitation, and lab tests. Proper follow-up can prevent another stroke from occurring. SEEK MEDICAL CARE IF:  You have personality changes.  You have difficulty swallowing.  You are seeing double.  You have dizziness.  You have a fever.  You have skin breakdown. SEEK IMMEDIATE MEDICAL CARE IF:  Any of these symptoms may represent a serious problem that is an emergency. Do not wait to see if the symptoms will go away. Get medical help right away. Call your local emergency services (911 in [place] Do not drive yourself to the hospital.  You have sudden weakness or numbness of the face, arm, or leg, especially on one side of the body.  You have sudden trouble walking or difficulty moving arms or legs.  You have sudden confusion.  You have trouble speaking (aphasia) or understanding.  You have sudden trouble seeing in one or both eyes.  You have a loss of  balance or coordination.  You have a sudden, severe headache with no known cause.  You have new chest pain or an irregular heartbeat.  You have a partial or total loss of consciousness. Document Released: 08/12/2005 Document Revised: 12/27/2013 Document Reviewed: 03/22/2012 [facility] Patient Information 2015 [place], [place]. This information is not intended to replace advice given to you by your health care provider. Make sure you discuss any questions you have with your health care provider.

## 2015-02-14 ENCOUNTER — Telehealth: Payer: Self-pay | Admitting: Family Medicine

## 2015-02-14 ENCOUNTER — Telehealth: Payer: Self-pay | Admitting: Neurology

## 2015-02-14 NOTE — Telephone Encounter (Signed)
Late entry.   Called Ms. Dicamillo today around 2pm to discuss the decision for Mr. Krichbaum to take both Eliquis and ASA (the ASA for cardiac benefit). At this time we feel the benefits outweigh the risk. Wife very distraught stating that they are having a difficult time obtaining the Eliquis due to the prior authorization.  She stated she called the neurologist several times today without a call back.  I contacted my office and they had not received any PAs for Mr. Saddler at that time. She stated the pharmacy was contacting the neurology office again. I suggested she may contact the pt's PCP to see if the PA went there. The wife voiced understanding and frustration. I appreciate the assistance of the [place] office in this matter.  Kathrine Cords, MD

## 2015-02-14 NOTE — Telephone Encounter (Signed)
Pharmacist from [facility] called regarding the prior authorization needed for this medication. She stated that they faxed over a form. Please call and advise. [phone] or Benjamine Mola)

## 2015-02-14 NOTE — Telephone Encounter (Signed)
Patients wife called and stated that the patient was seen by Dr. Leonie Man in the hospital and was given Rx. apixaban (ELIQUIS) 5 MG TABS tablet. She states that the medication needs to be pre approved by the provider for insurance reasons. Please call and advise.

## 2015-02-14 NOTE — Telephone Encounter (Signed)
We have not gotten anything from the pharmacy (likely because this was prescribed by Archie Patten, MD).  Pharmacy benefit card does not appear to be scanned.  I called the pharmacy.  They verified a fax had not been sent to [place].  Indicated the patient's prescription benefits are through [facility].  ID# [id number].  I contacted [employer].  Provided clinical info.  Request is currently under review Ref Key: [id number].  The patient may be able to obtain a voucher online for one month free trial.  I called back, got no answer.

## 2015-02-14 NOTE — Telephone Encounter (Signed)
I called again and spoke with [name].  She is aware request is under review through ins.  She will go online to obtain free trial voucher, and will call back if anything further is needed.

## 2015-02-14 NOTE — Telephone Encounter (Signed)
Patient's wife called again.. She states that she spoke with Dr. Lorenso Courier and she informed the patient that he needed to call his doctor in order to get this medication refilled. They are very concerned because the patient has been out of the medication since yesterday. Please call and advise.

## 2015-02-14 NOTE — Telephone Encounter (Signed)
Patient wife called back and wants [name] to cal her back [phone].

## 2015-02-14 NOTE — Telephone Encounter (Signed)
Patients wife called regarding the medication. She states that her husband ran out of the medication yesterday and needs a refill immediately. The patient was seeing Dr. Leonie Man but now sees Dr. Jannifer Franklin, a Dr. In the hospital wrote the prescription but she states Dr. Leonie Man was who orginially wanted him on the medication. Please call and advise.

## 2015-02-14 NOTE — Telephone Encounter (Signed)
I just received a fax from ins stating they have approved the request for coverage on Eliquis effective until 08/16/2015 Ref # [id number].  I called the pharmacy to ensure Rx processes without any issues.  Spoke with pharmacist who reprocessed the claim and said it did go through for $35 co-pay.  I called [name] back to advise.  She is aware and will pick up the Rx today.  She will callus back if anything further is needed.

## 2015-02-15 ENCOUNTER — Ambulatory Visit (INDEPENDENT_AMBULATORY_CARE_PROVIDER_SITE_OTHER): Payer: Medicare Other | Admitting: *Deleted

## 2015-02-15 ENCOUNTER — Encounter: Payer: Self-pay | Admitting: Internal Medicine

## 2015-02-15 ENCOUNTER — Encounter: Payer: Self-pay | Admitting: Cardiology

## 2015-02-15 DIAGNOSIS — F329 Major depressive disorder, single episode, unspecified: Secondary | ICD-10-CM | POA: Diagnosis not present

## 2015-02-15 DIAGNOSIS — I2510 Atherosclerotic heart disease of native coronary artery without angina pectoris: Secondary | ICD-10-CM | POA: Diagnosis not present

## 2015-02-15 DIAGNOSIS — E119 Type 2 diabetes mellitus without complications: Secondary | ICD-10-CM | POA: Diagnosis not present

## 2015-02-15 DIAGNOSIS — K219 Gastro-esophageal reflux disease without esophagitis: Secondary | ICD-10-CM | POA: Diagnosis not present

## 2015-02-15 DIAGNOSIS — Z8673 Personal history of transient ischemic attack (TIA), and cerebral infarction without residual deficits: Secondary | ICD-10-CM | POA: Diagnosis not present

## 2015-02-15 DIAGNOSIS — E785 Hyperlipidemia, unspecified: Secondary | ICD-10-CM | POA: Diagnosis not present

## 2015-02-15 DIAGNOSIS — I639 Cerebral infarction, unspecified: Secondary | ICD-10-CM

## 2015-02-15 DIAGNOSIS — Z9181 History of falling: Secondary | ICD-10-CM | POA: Diagnosis not present

## 2015-02-15 NOTE — Progress Notes (Signed)
Loop recorder 

## 2015-02-16 NOTE — Progress Notes (Signed)
Received a message that patient was supposed to have a [place] RN come to the home at discharge.  CM checked the chart and there is no order for a HHRN. Patient was seen by [facility] for HHPT, as ordered.  CM spoke with Miranda with [facility] to notify her of the situation.  Per Jeannetta Nap, the [facility] office will contact patient's PCP to see if he is willing to order a HHRN.  CM spoke with patient's wife to notify her of the lack of orders and the plans to work toward resolution. Wife verbalized understanding and is in agreement with the plan.

## 2015-02-17 DIAGNOSIS — I2510 Atherosclerotic heart disease of native coronary artery without angina pectoris: Secondary | ICD-10-CM | POA: Diagnosis not present

## 2015-02-17 DIAGNOSIS — E119 Type 2 diabetes mellitus without complications: Secondary | ICD-10-CM | POA: Diagnosis not present

## 2015-02-17 DIAGNOSIS — K219 Gastro-esophageal reflux disease without esophagitis: Secondary | ICD-10-CM | POA: Diagnosis not present

## 2015-02-17 DIAGNOSIS — Z8673 Personal history of transient ischemic attack (TIA), and cerebral infarction without residual deficits: Secondary | ICD-10-CM | POA: Diagnosis not present

## 2015-02-17 DIAGNOSIS — F329 Major depressive disorder, single episode, unspecified: Secondary | ICD-10-CM | POA: Diagnosis not present

## 2015-02-17 DIAGNOSIS — E785 Hyperlipidemia, unspecified: Secondary | ICD-10-CM | POA: Diagnosis not present

## 2015-02-20 ENCOUNTER — Encounter: Payer: Self-pay | Admitting: Internal Medicine

## 2015-02-20 ENCOUNTER — Ambulatory Visit (INDEPENDENT_AMBULATORY_CARE_PROVIDER_SITE_OTHER): Payer: Medicare Other | Admitting: Internal Medicine

## 2015-02-20 VITALS — BP 104/70 | HR 76 | Ht 68.0 in | Wt 217.0 lb

## 2015-02-20 DIAGNOSIS — I2699 Other pulmonary embolism without acute cor pulmonale: Secondary | ICD-10-CM

## 2015-02-20 DIAGNOSIS — I639 Cerebral infarction, unspecified: Secondary | ICD-10-CM | POA: Diagnosis not present

## 2015-02-20 DIAGNOSIS — J920 Pleural plaque with presence of asbestos: Secondary | ICD-10-CM

## 2015-02-20 DIAGNOSIS — G4733 Obstructive sleep apnea (adult) (pediatric): Secondary | ICD-10-CM | POA: Diagnosis not present

## 2015-02-20 DIAGNOSIS — R1314 Dysphagia, pharyngoesophageal phase: Secondary | ICD-10-CM

## 2015-02-20 DIAGNOSIS — J9610 Chronic respiratory failure, unspecified whether with hypoxia or hypercapnia: Secondary | ICD-10-CM | POA: Diagnosis not present

## 2015-02-20 NOTE — Patient Instructions (Addendum)
Ok with me to continue the lyrica  The most likely source of the cough is the swallowing problem and the weight  Pulmonary follow up is as needed here > Dr Gwenette Greet can see you at the [facility] for both your breathing coughing and sleep issues

## 2015-02-20 NOTE — Progress Notes (Signed)
Patient ID: [name], male    DOB: Jul 01, 1946  MRN: [id number]    Brief patient profile:  70 yowm never smoker with obesity and osa/appliance dep with h/o ? Asthma as child and then needed inhalers with colds only  since 2004 referred by [name] for eval of sob x 2013 after eval by [name] for hiccups in 2014 dx'd with ? Asbestosis.  History of Present Illness  02/11/2013 1st pulmonary eval cc indolent onset progressive doe esp out in hot weather x one year with some dry cough on hot air exposure and even indoors has to stop about 150 yards nl pace with stress test by [facility] March 2014 with nl ef, low risk IHD.  Has used inhalers in past, but not in past year  Has noted some assoc with  swelling but no orthopnea.  rec GERD diet Continue prilosec 2 before bfast and 2 before supper. Stop losartan and lopressor(metaprolol) Start bystolic 10 mg twice daily for now    05/18/2014 extended "re-establish" ov/[name] re: unexplained hypoxemia/ fits of sob with loss of voice  Chief Complaint  Patient presents with  . Follow-up    Pt c/o increased SOB for the past several months. He was started on supplemental o2 in Aug 2015 during hospital admission.  He states that he gets SOB with walking any distance or even just at rest.   onset of hiccups x one year followed by gradually worsening  cough fits mostly daytime assoc with brief inability to speak lasting sev min then resolves with sob just as likely at rest as with ex Brings up [name] mucus esp at hs on a bipap machine which seems to help sleep, no worse in am Has not tried inhalers  On max acid suppression per [name] but not diet  [facility], wife now present saying our med sheet is correct but his reporting of previous symptoms is not (he says he was better last ov, she says not). Not clear why hasn't returned in 8 m but apparently dealing with stroke sequelae No swallowing eval in emr   On bipap per neuro "helps a lot" per pt but wife  says still waking freq rec CTa > pos PE/ multiple    Admit date: 05/19/2014  Discharge date: 05/25/2014  Discharge Diagnoses:  Pulmonary embolism  Upper airway Cough Syndrome  Pleural Plaque  Acute hypoxic respiratory failure  HTN  Known CAD  GERD  Diabetes  Insomnia  Detailed Hospital Course:  This is a pleasant 69 y/o male who saw [name] day prior to ov for shortness of breath. He notes ongoing dyspnea and cough with Jarnagin mucus production for several years, but in the last month he has had increased dyspnea even at rest. Notably, he was hospitalized for 3 days one month prior to admit for a kidney stone. He was discharged home on oxygen with a presumptive diagnosis of asbestosis. He went to [name] on 9/23 for the dyspnea. A CT angiogram was ordered and he was found to have a pulmonary embolism.  He was admitted to the hospital intensive care. ECHO was obtained: ECHO showed EF 55-60%, hypokinesis of the apicalanteroseptal myocardium, grade I diastolic dysfunction, but no evidence of right heart strain. He was treated supportively on Oxygen, IV heparin and coumadin overlap. NOAC therapy wa discussed but the pt decided on Coumadin. As of time of discharge he is now on day #7  Heparin but  His INR remains subtherapeutic but  he desires to go home and we feel he can safely do this on a LMWH overlap with his coumadin. Prior to discharge he was ambulated to assess Oxygen requirements.  Per nursing notes walked hallway s distress and s 02  Discharge Plan by active problems  Possibly Provoked pulmonary embolism, non-massive as hemodynamically stable  (discussed NOAC vs warfarin, he prefers warfarin)  Upper airway cough syndrome  Hx of questioned Asbestos related pleural disease> no evidence of asbestosis  Acute hypoxemic respiratory failure > due to PE  PLAN  -now on day #7 overlap. INR remains sub therapeutic. Will send him home on LMWH and coumadin. Will send him home on 12.5 mg today/10mg   tomorrow and ck INR. Will go on LMWH at 90mg  q12.  -f/u [facility] 10/2 at 10am  -plan three months warfarin (given this was a presumed provoked PE)  -titrate O2 for SpO2 > 92%, hopefully can d/c in future (started recently for suspected asbestosis, likely PE though)  OSA (felt central) f/b [name]  Plan  Resume BIPAP per home settings: 16/8 cm with a rate of 10 per minute.  Hypertension  Known CAD  PLAN  -continue preadmission b-blocker, statin  GERD  PLAN  - PPI  DM2  PLAN  -continue home meds  Insomnia  PLAN  - Continue home meds  Anemia  PLAN  -trend CBC  Significant Hospital tests/ studies  Consults: pharmacy  ECHO 9/24: EF 55-60%, hypokinesis of the apicalanteroseptal myocardium, grade I diastolic dysfunction, but no evidence of right heart strain.    Lab Results   Component  Value  Date    INR  1.76*  05/25/2014    INR  1.49  05/24/2014    INR  1.30  05/23/2014     05/30/14 extended post hosp ov/ transition of care / [name]  On coumadin 5 mg daily per coumadin clinic per pt Chief Complaint  Patient presents with  . HFU    Pt states that there has been no improvement in his breathing since hospital d/c.    very confused with all aspects of care, daughter was helping now flying back to [place] "you should ask her" Wife has pages of redundant and conflicting sheets because she keeps old avs's and refers back to them for his current care.  Cc doe x across the room even on 02 whereas hosp notes say walking entire hallway off 02 s difficulty albeit at slow pace rec I will ask the coumadin clinic to contact you  re timing for follow up and make sure you are taking the right dose Continue 2lpm 24/7 See [name] re your abnormal swallowing study to see if anything can be done or you need to be referred to a medical center.     06/13/2014 f/u ov/[name] re: s/p PE/ ? Component vcd from es dysfunction / has not seen Jacob Dorothea Sharp yet  Chief Complaint  Patient  presents with  . Follow-up    PFT done today. Breathing is unchanged. No new co's today.   did [employer] on 02 2lpm poc at slow pace  And doing PT twice daily x 1 hour through the [place]  Does fine on bipap per neurology but burps worse when places it on. rec Let your bipap doctor know that your belching is worse when you put the bipap on at night Discuss with [name] your abnormal swallowing study from [facility] 05/19/14 to decide what if anything can be done and whether you need  to seek care at a medical center   10/03/14 acute ov /[name] re: 02 dep resp failure/ obesity/deconditioning/ sp pe 04/2014 on no pulmonary meds  Chief Complaint  Patient presents with  . Acute Visit    Increased SOB, chest tightness and coughing. Mucus is [place] and thick. Onset was 1 year ago.  starting rehab as of day of ov/ able to [employer] but resting x 3 to complete the shopping - not monitoring sats until after sits down on 2lpm continuous  Followed in coumadin clinic but not sure re instructions for refills  rec rec 2lpm 24/7 but 3lpm pulsed when walking       12/08/2014 f/u ov/[name] re:   breathing worse since Mid March 2016 p finished rehab on 2lpm 24/7 and 3lpm with walking Chief Complaint  Patient presents with  . Follow-up    Pt states SOB, cough and chest tightness are unchanged since the last visit 11/29/14.   sleeping fine on bipap / 02 2lpm per neuro Taking lasix 20 mg 2 each am as maint (misunderstood instructions) rec GERD  Diet  If not improving, first step is See Tammy NP for med rec w/in 2 weeks> did not return    02/20/2015 f/u ov/[name] re: obesity s ohs with chronic resp failure / dysphagia with uacs / maint on eliquis now for remote PE  Chief Complaint  Patient presents with  . Follow-up    Pt had stroke 02/11/15.  Pt states his breathing has been worse since he was d/c'ed from the hospital. He states he gets out of breath walking 200 ft.    more coughing related to meals/ [facility]     No obvious day to day or daytime variabilty or assoc  cp or chest tightness, subjective wheeze overt sinus or hb symptoms. No unusual exp hx or h/o childhood pna/ asthma or knowledge of premature birth.  Sleeping ok without nocturnal  or early am exacerbation  of respiratory  c/o's or need for noct saba. Also denies any obvious fluctuation of symptoms with weather or environmental changes or other aggravating or alleviating factors except as outlined above   Current Medications, Allergies, Complete Past Medical History, Past Surgical History, Family History, and Social History were reviewed in [employer] record.  ROS  The following are not active complaints unless bolded sore throat, dysphagia, dental problems, itching, sneezing,  nasal congestion or excess/ purulent secretions, ear ache,   fever, chills, sweats, unintended wt loss, pleuritic or exertional cp, hemoptysis,  orthopnea pnd or leg swelling, presyncope, palpitations, heartburn, abdominal pain, anorexia, nausea, vomiting, diarrhea  or change in bowel or urinary habits, change in stools or urine, dysuria,hematuria,  rash, arthralgias, visual complaints, headache, numbness weakness or ataxia or problems with walking or coordination,  change in mood/affect or memory.            Objective:   Physical Exam   Gen: obese wm nad/mod expressive aphasia   02/20/2015        217 Wt Readings from Last 3 Encounters:  12/08/14 211 lb 3.2 oz (95.8 kg)  11/29/14 214 lb (97.07 kg)  11/08/14 211 lb (95.709 kg)    Vital signs reviewed   ENT: No lesions,  mouth clear,  oropharynx clear, no postnasal drip  Neck: No JVD, no TMG, no carotid bruits  Lungs: No use of accessory muscles, no dullness to percussion, clear without rales or rhonchi  Cardiovascular: RRR, heart sounds normal, no murmur or  gallops, no peripheral edema  Musculoskeletal: No deformities, no cyanosis or clubbing  Neuro: alert, L leg weaker than R/  L handed   Skin: Warm, no lesions or rashes         Assessment & Plan:    Outpatient Encounter Prescriptions as of 02/20/2015  Medication Sig  . apixaban (ELIQUIS) 5 MG TABS tablet Take 1 tablet (5 mg total) by mouth 2 (two) times daily.  Marland Kitchen aspirin EC 81 MG EC tablet Take 1 tablet (81 mg total) by mouth daily.  Marland Kitchen atorvastatin (LIPITOR) 80 MG tablet Take 1 tablet (80 mg total) by mouth daily. (Patient taking differently: Take 80 mg by mouth at bedtime. )  . FLUoxetine (PROZAC) 10 MG capsule Take 30 mg by mouth every morning.  . furosemide (LASIX) 20 MG tablet TAKE 1-2 TABLETS BY MOUTH EVERY DAY AS NEEDED (Patient taking differently: 40 mg DAILY in the morning (4am))  . glucose blood (ONE TOUCH ULTRA TEST) test strip   . methocarbamol (ROBAXIN) 750 MG tablet Take 750 mg by mouth every 8 (eight) hours as needed for muscle spasms.   . metoprolol (LOPRESSOR) 50 MG tablet Take 0.5 tablets (25 mg total) by mouth 2 (two) times daily.  . nitroGLYCERIN (NITROSTAT) 0.4 MG SL tablet Place 1 tablet (0.4 mg total) under the tongue every 5 (five) minutes as needed. For chest pain  . ONETOUCH DELICA LANCETS FINE MISC   . pantoprazole (PROTONIX) 40 MG tablet Take 40 mg by mouth 2 (two) times daily before a meal.  . potassium citrate (UROCIT-K) 10 MEQ (1080 MG) SR tablet Take 10 mEq by mouth 3 (three) times daily with meals.   . sitaGLIPtin (JANUVIA) 100 MG tablet Take 100 mg by mouth every morning.   . traZODone (DESYREL) 150 MG tablet Take 150 mg by mouth at bedtime.   No facility-administered encounter medications on file as of 02/20/2015.

## 2015-02-21 ENCOUNTER — Telehealth: Payer: Self-pay | Admitting: *Deleted

## 2015-02-21 ENCOUNTER — Encounter: Payer: Self-pay | Admitting: Internal Medicine

## 2015-02-21 DIAGNOSIS — Z8673 Personal history of transient ischemic attack (TIA), and cerebral infarction without residual deficits: Secondary | ICD-10-CM | POA: Diagnosis not present

## 2015-02-21 DIAGNOSIS — I2510 Atherosclerotic heart disease of native coronary artery without angina pectoris: Secondary | ICD-10-CM | POA: Diagnosis not present

## 2015-02-21 DIAGNOSIS — K219 Gastro-esophageal reflux disease without esophagitis: Secondary | ICD-10-CM | POA: Diagnosis not present

## 2015-02-21 DIAGNOSIS — F329 Major depressive disorder, single episode, unspecified: Secondary | ICD-10-CM | POA: Diagnosis not present

## 2015-02-21 DIAGNOSIS — E119 Type 2 diabetes mellitus without complications: Secondary | ICD-10-CM | POA: Diagnosis not present

## 2015-02-21 DIAGNOSIS — E785 Hyperlipidemia, unspecified: Secondary | ICD-10-CM | POA: Diagnosis not present

## 2015-02-21 LAB — CUP PACEART REMOTE DEVICE CHECK
MDC IDC SESS DTM: 20160618175922
MDC IDC SET ZONE DETECTION INTERVAL: 370 ms
Zone Setting Detection Interval: 2000 ms
Zone Setting Detection Interval: 3000 ms

## 2015-02-21 NOTE — Assessment & Plan Note (Signed)
Presently followed by neuro > if pulmonary sleep med needed could also use the [place] in [place] where he's having his GI eval done anyway since Dr Charolotte Eke is there now.

## 2015-02-21 NOTE — Assessment & Plan Note (Signed)
Body mass index is 33 kg/(m^2).  Lab Results  Component Value Date   TSH 2.44 12/08/2014     Contributing to gerd tendency/ doe/dm/osa and 02 dep resp failure >  needs to achieve and maintain neg calorie balance >f/u primary care

## 2015-02-21 NOTE — Telephone Encounter (Signed)
Herbert Deaner called the office, stated that the patient is being seen at home for a stroke. He needs a verbal order for skilled nursing, medications, disease management, and skin care issues. Dr. [name], please call Mr. Hoffman at [phone]. Per Dr. [name], Mr. Heber Rake can fax a letter over to the office and he will be glad to sign, because he knows that the patient can use these services. A message was left on Mr. Hoffman's voice mail in regards to the this matter.

## 2015-02-21 NOTE — Assessment & Plan Note (Signed)
Ct [facility] 11/04/2012 1. Normal heart size with changes of CABG. 2. Small hiatal hernia. 3. Bilateral pleural calcifications along the pleura anteriorly and posteriorly with pleural thickening in these locations. This is possibly related to remote inflammatory/traumatic etiology with asbestosis not excluded - repeat CTa 05/19/2014 > no evidence of ILD/ asbestosis

## 2015-02-21 NOTE — Assessment & Plan Note (Signed)
-   See CTa 05/19/14 and admit > coumadin rx  > changed to eliquis 01/2015  - Echo 05/20/14 nl RH pressures/fxn - Venous dopplers 06/08/2014 > neg bilaterally    I never though this was a major cause of his multiple cause hypoxemia/ chronic dyspnea which I attrbibuted to obesity which put him at risk of PE in the first place and has never been addressed so now that having cva's has 2 reasons to continue eliquis indefinitely and note baby asa also added by neuro which is fine with me in the absence of any bleeding  I had an extended discussion with the patient reviewing all relevant studies completed to date and  lasting 15 to 20 minutes of a 25 minute visit    Each maintenance medication was reviewed in detail including most importantly the difference between maintenance and prns and under what circumstances the prns are to be triggered using an action plan format that is not reflected in the computer generated alphabetically organized AVS.    Please see instructions for details which were reviewed in writing and the patient given a copy highlighting the part that I personally wrote and discussed at today's ov.

## 2015-02-21 NOTE — Assessment & Plan Note (Signed)
Placed on 02 at d/c 03/2014  - 10/03/2014   Walked 2lpm x 10 ft desat but on 3lpm pulsed completed 2 laps at 185 ft each with adequate sats - 12/08/2014   Walked 3lpm x one lap @ 185 stopped due to  Sob but no desat nl pace  rec 02/20/2015 3lpm 24h/day

## 2015-02-21 NOTE — Assessment & Plan Note (Addendum)
-   DG es 05/19/2014  There was normal pharyngeal anatomy and motility. Contrast flowed freely through the esophagus without evidence of stricture or mass. There was normal esophageal mucosa without evidence of irregularity or ulceration. There are intermittent diffuse esophageal spasms. During the esophageal spasms there is retention of contrast within the esophagus without significant emptying. No evidence of reflux. No definite hiatal hernia was demonstrated. - [facility]  This is the likely cause of his cough and probably also contributing to his sense of dyspnea > encouraged them to keep appt to see [facility]

## 2015-02-22 ENCOUNTER — Ambulatory Visit (INDEPENDENT_AMBULATORY_CARE_PROVIDER_SITE_OTHER): Payer: Medicare Other | Admitting: Family Medicine

## 2015-02-22 ENCOUNTER — Encounter: Payer: Self-pay | Admitting: Family Medicine

## 2015-02-22 VITALS — BP 128/80 | HR 74 | Temp 97.8°F | Resp 16 | Ht 66.0 in | Wt 218.0 lb

## 2015-02-22 DIAGNOSIS — H9193 Unspecified hearing loss, bilateral: Secondary | ICD-10-CM | POA: Diagnosis not present

## 2015-02-22 DIAGNOSIS — I639 Cerebral infarction, unspecified: Secondary | ICD-10-CM

## 2015-02-22 DIAGNOSIS — I69359 Hemiplegia and hemiparesis following cerebral infarction affecting unspecified side: Secondary | ICD-10-CM

## 2015-02-22 DIAGNOSIS — J61 Pneumoconiosis due to asbestos and other mineral fibers: Secondary | ICD-10-CM

## 2015-02-22 DIAGNOSIS — R0902 Hypoxemia: Secondary | ICD-10-CM | POA: Diagnosis not present

## 2015-02-22 DIAGNOSIS — I69328 Other speech and language deficits following cerebral infarction: Secondary | ICD-10-CM | POA: Diagnosis not present

## 2015-02-22 DIAGNOSIS — F329 Major depressive disorder, single episode, unspecified: Secondary | ICD-10-CM

## 2015-02-22 DIAGNOSIS — I10 Essential (primary) hypertension: Secondary | ICD-10-CM

## 2015-02-22 DIAGNOSIS — R51 Headache: Secondary | ICD-10-CM | POA: Diagnosis not present

## 2015-02-22 DIAGNOSIS — F32A Depression, unspecified: Secondary | ICD-10-CM

## 2015-02-22 DIAGNOSIS — R29898 Other symptoms and signs involving the musculoskeletal system: Secondary | ICD-10-CM | POA: Diagnosis not present

## 2015-02-22 DIAGNOSIS — R519 Headache, unspecified: Secondary | ICD-10-CM

## 2015-02-22 NOTE — Progress Notes (Signed)
  Subjective:  Patient ID: [name], male    DOB: [birth year]  Age: 69 y.o. MRN: [id number]  Patient is here for many things. 2 weeks ago he had a CVA which was a small extension of his old infarct. He had some dysarthria and weakness. He has recovered most of his strength and his speech is fairly clear now. He walks with a walker or with a lot of assistance. He gets shaking spells when he tries to walk. He has home physical therapy coming in helping him currently. The PT wants documentation of his order for home O2, which has been 3 L/m. The home health also needed documentation regarding his home O2. He has been ordered it because of his asbestosis and O2 saturation going down to upper 80s at times. His neurology/sleep apnea Dr. told him to stay on it 24 hours a day. He does not smoke. He does get short of breath at times especially when laying down. He has some cough. He denies any major pains at this moment, but he has frequent headaches about 5 days a week. He wants to know what he can take for that. He's been taking some Aleve. They placed him on it request in the hospital and I told him that he cannot take an inflammatory-type medications. We had a long talk about what he can take. Apparently he has had a seizure disorder at some point. He has had tramadol at some point in the past, but I'm telling him that he cannot take it. Denies any chest pains currently or shortness of breath today. He has chronic diarrhea from partial bowel resection for diverticulitis.   Objective:   No acute distress. A little bit sluggish speech. Heart of hearing. He wanted his ears checked. TMs are normal. A little wax in both ear canals. Throat clear. Neck supple without nodes thyromegaly. No carotid bruits. Chest is clear to all station. Heart regular without murmurs gallops or arrhythmias. Abdomen soft without masses or tenderness. Trace ankle edema.  Assessment: Recurrent CVA Mild dysarthria and  weakness. 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 No NSAIDs. Debrox for ears. All for pain. Return as needed.   Assessment & Plan:   Assessment: Assessment: Recurrent CVA Mild dysarthria and weakness. 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  Return to see Dr. [name] in about 2-3 months, sooner if needed       [facility], MD 02/22/2015

## 2015-02-22 NOTE — Patient Instructions (Signed)
Continue current home oxygen  Continue to try and be as active as possible  Take Tylenol 500 mg 2 tablets 3 times daily as needed for pains or headaches  No ibuprofen or naproxen (Advil or Aleve) while on blood thinners  Try using some Debrox (store brand available over the counter) about every 3 months when needed to clean the wax out the ears.  Return to see Dr. [name] in about 2-3 months, sooner if needed

## 2015-02-23 DIAGNOSIS — Z8673 Personal history of transient ischemic attack (TIA), and cerebral infarction without residual deficits: Secondary | ICD-10-CM | POA: Diagnosis not present

## 2015-02-23 DIAGNOSIS — E785 Hyperlipidemia, unspecified: Secondary | ICD-10-CM | POA: Diagnosis not present

## 2015-02-23 DIAGNOSIS — F329 Major depressive disorder, single episode, unspecified: Secondary | ICD-10-CM | POA: Diagnosis not present

## 2015-02-23 DIAGNOSIS — K219 Gastro-esophageal reflux disease without esophagitis: Secondary | ICD-10-CM | POA: Diagnosis not present

## 2015-02-23 DIAGNOSIS — I2510 Atherosclerotic heart disease of native coronary artery without angina pectoris: Secondary | ICD-10-CM | POA: Diagnosis not present

## 2015-02-23 DIAGNOSIS — E119 Type 2 diabetes mellitus without complications: Secondary | ICD-10-CM | POA: Diagnosis not present

## 2015-02-23 NOTE — Telephone Encounter (Signed)
We received a faxed written order/plan of care from [facility] for pt. I have put it in Dr Perfecto Kingdom box for review and signatures.

## 2015-02-26 DIAGNOSIS — E119 Type 2 diabetes mellitus without complications: Secondary | ICD-10-CM | POA: Diagnosis not present

## 2015-02-26 DIAGNOSIS — F329 Major depressive disorder, single episode, unspecified: Secondary | ICD-10-CM | POA: Diagnosis not present

## 2015-02-26 DIAGNOSIS — Z8673 Personal history of transient ischemic attack (TIA), and cerebral infarction without residual deficits: Secondary | ICD-10-CM | POA: Diagnosis not present

## 2015-02-26 DIAGNOSIS — K219 Gastro-esophageal reflux disease without esophagitis: Secondary | ICD-10-CM | POA: Diagnosis not present

## 2015-02-26 DIAGNOSIS — E785 Hyperlipidemia, unspecified: Secondary | ICD-10-CM | POA: Diagnosis not present

## 2015-02-26 DIAGNOSIS — I2510 Atherosclerotic heart disease of native coronary artery without angina pectoris: Secondary | ICD-10-CM | POA: Diagnosis not present

## 2015-02-27 DIAGNOSIS — F329 Major depressive disorder, single episode, unspecified: Secondary | ICD-10-CM | POA: Diagnosis not present

## 2015-02-27 DIAGNOSIS — I2510 Atherosclerotic heart disease of native coronary artery without angina pectoris: Secondary | ICD-10-CM | POA: Diagnosis not present

## 2015-02-27 DIAGNOSIS — Z8673 Personal history of transient ischemic attack (TIA), and cerebral infarction without residual deficits: Secondary | ICD-10-CM | POA: Diagnosis not present

## 2015-02-27 DIAGNOSIS — K219 Gastro-esophageal reflux disease without esophagitis: Secondary | ICD-10-CM | POA: Diagnosis not present

## 2015-02-27 DIAGNOSIS — E119 Type 2 diabetes mellitus without complications: Secondary | ICD-10-CM | POA: Diagnosis not present

## 2015-02-27 DIAGNOSIS — E785 Hyperlipidemia, unspecified: Secondary | ICD-10-CM | POA: Diagnosis not present

## 2015-02-28 ENCOUNTER — Other Ambulatory Visit: Payer: Self-pay

## 2015-02-28 NOTE — Patient Outreach (Signed)
[facility]) Care Management  02/28/2015  AQUILLA VOILES 06-27-1946 [id number]   RED on EMMI Stroke Dashboard, assigned Quinn Plowman, RN to outreach.  Ronnell Freshwater. [place], [place] Management [place] Assistant Phone: [phone] Fax: [phone]

## 2015-02-28 NOTE — Patient Outreach (Signed)
[facility]) Care Management  02/28/2015  TIBURCIO LINDER Feb 23, 1946 [id number]   Telephone call to patient regarding EMMI stroke transition program referral.  Unable to reach patient. HIPAA compliant voice message left with call back phone number.   PLAN: RNCM will attempt 2nd telephone outreach to patient within 3 business days.   Quinn Plowman RN,BSN,CCM [place] Coordinator [phone]

## 2015-03-01 ENCOUNTER — Other Ambulatory Visit: Payer: Self-pay

## 2015-03-01 DIAGNOSIS — K219 Gastro-esophageal reflux disease without esophagitis: Secondary | ICD-10-CM | POA: Diagnosis not present

## 2015-03-01 DIAGNOSIS — I2510 Atherosclerotic heart disease of native coronary artery without angina pectoris: Secondary | ICD-10-CM | POA: Diagnosis not present

## 2015-03-01 DIAGNOSIS — I639 Cerebral infarction, unspecified: Secondary | ICD-10-CM

## 2015-03-01 DIAGNOSIS — F329 Major depressive disorder, single episode, unspecified: Secondary | ICD-10-CM | POA: Diagnosis not present

## 2015-03-01 DIAGNOSIS — Z8673 Personal history of transient ischemic attack (TIA), and cerebral infarction without residual deficits: Secondary | ICD-10-CM | POA: Diagnosis not present

## 2015-03-01 DIAGNOSIS — E119 Type 2 diabetes mellitus without complications: Secondary | ICD-10-CM | POA: Diagnosis not present

## 2015-03-01 DIAGNOSIS — E785 Hyperlipidemia, unspecified: Secondary | ICD-10-CM | POA: Diagnosis not present

## 2015-03-01 NOTE — Patient Outreach (Signed)
[name]) Care Management  03/01/2015  [name] 07/28/1946 [id number]   SUBJECTIVE:  Telephone call to patient regarding EMMI stroke transition program.  HIPAA verified with patient. Patient gave verbal authorization to speak with his wife, [name] regarding all medical/financial information.  Wife states patient has been sleeping a lot and sometimes she hangs up when the automated calls for EMMI stroke come through. Wife states she has made follow up appointment with neurologist for patient in August 2016. Wife states patient had follow up appointment with his primary MD on 02/22/15. Wife states patient has lost interest in things he use to enjoy and gets depressed because he is unable to do what he use to do. Wife states patient has an appointment to follow up with his [place] doctor on Friday 02/01/15.  Wife states patient is on medication for depression at this time.  Wife states patient was seen by a psychiatrist who prescribed the depression medication approximately 8 months ago.  Wife states she will discuss patients symptoms with [facility] on Friday 02/01/15 and request a follow up appointment with psychiatrist.  Wife states patient had another stroke approximately 1 year ago.  Wife states patient still has problems with his left leg from the stroke he had 1 year ago. Wife states patient is on oxygen and has to use a walker and scooter.   Wife states patient's symptom from most recent stroke has resolved. Wife states patients speech has returned to normal and he is walking better.  Wife states patient is receiving physical therapy 2 times per week [facility] 1 time per week for 4 weeks. Wife states they are monitoring patients blood sugars and skin.  Wife states patient scratches a lot and pick his skin.  Wife states she is concerned about leaving patient at home by himself.  Wife states patient has [facility] benefit that allows him 30 days of  sitter care, 6 hours per week.  Wife states patient has neuropathy and chronic back pain.  Wife states patient sees a pain doctor at [facility] in [place], [place]. Wife states patient has a urologist and GI doctor.   ASSESSMENT:  EMMI stroke transition program. Recent hospitalization for stroke 05/14/15 to 05/16/15.  PLAN:  RNCM will contact patients caregiver/wife within 1 week for follow up. RNCM will send caregiver/patient EMMI education material:  Recovering from stroke and Stroke signs and Preventing falls  Quinn Plowman [facility] [place] Coordinator [phone]

## 2015-03-02 ENCOUNTER — Telehealth: Payer: Self-pay | Admitting: *Deleted

## 2015-03-02 DIAGNOSIS — K219 Gastro-esophageal reflux disease without esophagitis: Secondary | ICD-10-CM | POA: Diagnosis not present

## 2015-03-02 DIAGNOSIS — E119 Type 2 diabetes mellitus without complications: Secondary | ICD-10-CM | POA: Diagnosis not present

## 2015-03-02 DIAGNOSIS — F329 Major depressive disorder, single episode, unspecified: Secondary | ICD-10-CM | POA: Diagnosis not present

## 2015-03-02 DIAGNOSIS — I2510 Atherosclerotic heart disease of native coronary artery without angina pectoris: Secondary | ICD-10-CM | POA: Diagnosis not present

## 2015-03-02 DIAGNOSIS — Z8673 Personal history of transient ischemic attack (TIA), and cerebral infarction without residual deficits: Secondary | ICD-10-CM | POA: Diagnosis not present

## 2015-03-02 DIAGNOSIS — E785 Hyperlipidemia, unspecified: Secondary | ICD-10-CM | POA: Diagnosis not present

## 2015-03-02 NOTE — Telephone Encounter (Signed)
Herbert Deaner called the office, stated that the patient is being seen at home for a stroke. He needs a verbal order for skilled nursing, medications, disease management, and skin care issues. Dr. [name], please call Mr. Hoffman at [phone]. He was calling back today

## 2015-03-02 NOTE — Telephone Encounter (Signed)
error 

## 2015-03-03 NOTE — Telephone Encounter (Signed)
Verbal okay given. He will send the forms to be signed

## 2015-03-07 ENCOUNTER — Encounter (HOSPITAL_COMMUNITY): Payer: Self-pay | Admitting: Emergency Medicine

## 2015-03-07 ENCOUNTER — Emergency Department (HOSPITAL_COMMUNITY): Payer: Medicare Other

## 2015-03-07 ENCOUNTER — Emergency Department (HOSPITAL_COMMUNITY)
Admission: EM | Admit: 2015-03-07 | Discharge: 2015-03-07 | Disposition: A | Discharge status: Home / Self Care | Payer: Medicare Other | Attending: Emergency Medicine | Admitting: Emergency Medicine

## 2015-03-07 ENCOUNTER — Ambulatory Visit: Payer: Medicare Other

## 2015-03-07 DIAGNOSIS — H9190 Unspecified hearing loss, unspecified ear: Secondary | ICD-10-CM | POA: Insufficient documentation

## 2015-03-07 DIAGNOSIS — M4300 Spondylolysis, site unspecified: Secondary | ICD-10-CM | POA: Insufficient documentation

## 2015-03-07 DIAGNOSIS — Z7901 Long term (current) use of anticoagulants: Secondary | ICD-10-CM | POA: Diagnosis not present

## 2015-03-07 DIAGNOSIS — Z951 Presence of aortocoronary bypass graft: Secondary | ICD-10-CM | POA: Insufficient documentation

## 2015-03-07 DIAGNOSIS — I10 Essential (primary) hypertension: Secondary | ICD-10-CM | POA: Insufficient documentation

## 2015-03-07 DIAGNOSIS — Z79899 Other long term (current) drug therapy: Secondary | ICD-10-CM | POA: Insufficient documentation

## 2015-03-07 DIAGNOSIS — E119 Type 2 diabetes mellitus without complications: Secondary | ICD-10-CM | POA: Insufficient documentation

## 2015-03-07 DIAGNOSIS — I2510 Atherosclerotic heart disease of native coronary artery without angina pectoris: Secondary | ICD-10-CM | POA: Diagnosis not present

## 2015-03-07 DIAGNOSIS — G40909 Epilepsy, unspecified, not intractable, without status epilepticus: Secondary | ICD-10-CM | POA: Insufficient documentation

## 2015-03-07 DIAGNOSIS — Z87442 Personal history of urinary calculi: Secondary | ICD-10-CM | POA: Diagnosis not present

## 2015-03-07 DIAGNOSIS — K219 Gastro-esophageal reflux disease without esophagitis: Secondary | ICD-10-CM | POA: Insufficient documentation

## 2015-03-07 DIAGNOSIS — Z9889 Other specified postprocedural states: Secondary | ICD-10-CM | POA: Insufficient documentation

## 2015-03-07 DIAGNOSIS — Z8673 Personal history of transient ischemic attack (TIA), and cerebral infarction without residual deficits: Secondary | ICD-10-CM | POA: Insufficient documentation

## 2015-03-07 DIAGNOSIS — G8929 Other chronic pain: Secondary | ICD-10-CM | POA: Insufficient documentation

## 2015-03-07 DIAGNOSIS — E785 Hyperlipidemia, unspecified: Secondary | ICD-10-CM | POA: Insufficient documentation

## 2015-03-07 DIAGNOSIS — R4781 Slurred speech: Secondary | ICD-10-CM | POA: Insufficient documentation

## 2015-03-07 DIAGNOSIS — R531 Weakness: Secondary | ICD-10-CM | POA: Insufficient documentation

## 2015-03-07 DIAGNOSIS — R2689 Other abnormalities of gait and mobility: Secondary | ICD-10-CM | POA: Diagnosis not present

## 2015-03-07 DIAGNOSIS — R51 Headache: Secondary | ICD-10-CM | POA: Diagnosis not present

## 2015-03-07 DIAGNOSIS — R404 Transient alteration of awareness: Secondary | ICD-10-CM | POA: Diagnosis not present

## 2015-03-07 DIAGNOSIS — R410 Disorientation, unspecified: Secondary | ICD-10-CM | POA: Diagnosis not present

## 2015-03-07 DIAGNOSIS — Z7982 Long term (current) use of aspirin: Secondary | ICD-10-CM | POA: Insufficient documentation

## 2015-03-07 DIAGNOSIS — R42 Dizziness and giddiness: Secondary | ICD-10-CM | POA: Diagnosis not present

## 2015-03-07 LAB — COMPREHENSIVE METABOLIC PANEL
ALBUMIN: 3.6 g/dL (ref 3.5–5.0)
ALK PHOS: 85 U/L (ref 38–126)
ALT: 24 U/L (ref 17–63)
ANION GAP: 8 (ref 5–15)
AST: 22 U/L (ref 15–41)
BUN: 18 mg/dL (ref 6–20)
CO2: 27 mmol/L (ref 22–32)
CREATININE: 1.02 mg/dL (ref 0.61–1.24)
Calcium: 9.1 mg/dL (ref 8.9–10.3)
Chloride: 104 mmol/L (ref 101–111)
GFR calc Af Amer: >60 mL/min (ref >60)
Glucose, Bld: 155 mg/dL — ABNORMAL HIGH (ref 65–99)
Potassium: 4 mmol/L (ref 3.5–5.1)
SODIUM: 139 mmol/L (ref 135–145)
TOTAL PROTEIN: 7.1 g/dL (ref 6.5–8.1)
Total Bilirubin: 0.5 mg/dL (ref 0.3–1.2)

## 2015-03-07 LAB — URINALYSIS, ROUTINE W REFLEX MICROSCOPIC
Bilirubin Urine: NEGATIVE
Glucose, UA: NEGATIVE mg/dL
HGB URINE DIPSTICK: NEGATIVE
Ketones, ur: NEGATIVE mg/dL
Leukocytes, UA: NEGATIVE
NITRITE: NEGATIVE
PROTEIN: NEGATIVE mg/dL
SPECIFIC GRAVITY, URINE: 1.026 (ref 1.005–1.030)
UROBILINOGEN UA: 0.2 mg/dL (ref 0.0–1.0)
pH: 5 (ref 5.0–8.0)

## 2015-03-07 LAB — DIFFERENTIAL
Basophils Absolute: 0 10*3/uL (ref 0.0–0.1)
Basophils Relative: 0 % (ref 0–1)
EOS PCT: 2 % (ref 0–5)
Eosinophils Absolute: 0.1 10*3/uL (ref 0.0–0.7)
Lymphocytes Relative: 14 % (ref 12–46)
Lymphs Abs: 0.8 10*3/uL (ref 0.7–4.0)
MONO ABS: 0.6 10*3/uL (ref 0.1–1.0)
Monocytes Relative: 10 % (ref 3–12)
Neutro Abs: 4.4 10*3/uL (ref 1.7–7.7)
Neutrophils Relative %: 74 % (ref 43–77)

## 2015-03-07 LAB — CBC
HCT: 34.5 % — ABNORMAL LOW (ref 39.0–52.0)
Hemoglobin: 11.3 g/dL — ABNORMAL LOW (ref 13.0–17.0)
MCH: 30.1 pg (ref 26.0–34.0)
MCHC: 32.8 g/dL (ref 30.0–36.0)
MCV: 92 fL (ref 78.0–100.0)
Platelets: 197 10*3/uL (ref 150–400)
RBC: 3.75 MIL/uL — ABNORMAL LOW (ref 4.22–5.81)
RDW: 13.5 % (ref 11.5–15.5)
WBC: 6 10*3/uL (ref 4.0–10.5)

## 2015-03-07 LAB — PROTIME-INR
INR: 1.21 (ref 0.00–1.49)
Prothrombin Time: 15.5 seconds — ABNORMAL HIGH (ref 11.6–15.2)

## 2015-03-07 LAB — I-STAT CHEM 8, ED
BUN: 22 mg/dL — ABNORMAL HIGH (ref 6–20)
CHLORIDE: 103 mmol/L (ref 101–111)
Calcium, Ion: 1.22 mmol/L (ref 1.13–1.30)
Creatinine, Ser: 1 mg/dL (ref 0.61–1.24)
GLUCOSE: 155 mg/dL — AB (ref 65–99)
HCT: 36 % — ABNORMAL LOW (ref 39.0–52.0)
Hemoglobin: 12.2 g/dL — ABNORMAL LOW (ref 13.0–17.0)
Potassium: 4 mmol/L (ref 3.5–5.1)
Sodium: 141 mmol/L (ref 135–145)
TCO2: 25 mmol/L (ref 0–100)

## 2015-03-07 LAB — RAPID URINE DRUG SCREEN, HOSP PERFORMED
AMPHETAMINES: NOT DETECTED
BARBITURATES: NOT DETECTED
Benzodiazepines: NOT DETECTED
COCAINE: NOT DETECTED
Opiates: NOT DETECTED
TETRAHYDROCANNABINOL: NOT DETECTED

## 2015-03-07 LAB — I-STAT TROPONIN, ED: Troponin i, poc: 0 ng/mL (ref 0.00–0.08)

## 2015-03-07 LAB — APTT: aPTT: 32 seconds (ref 24–37)

## 2015-03-07 LAB — ETHANOL: Alcohol, Ethyl (B): <5 mg/dL (ref <5)

## 2015-03-07 MED ORDER — MECLIZINE HCL 12.5 MG PO TABS: 12.5000 mg | ORAL_TABLET | Freq: Two times a day (BID) | ORAL | Status: DC | PRN

## 2015-03-07 MED ORDER — MECLIZINE HCL 25 MG PO TABS
12.5000 mg | ORAL_TABLET | Freq: Once | ORAL | Status: AC
  Administered 2015-03-07: 12.5 mg via ORAL
  Filled 2015-03-07: qty 1

## 2015-03-07 NOTE — ED Provider Notes (Addendum)
CSN: [id number]     Arrival date & time 03/07/15  0174 History   First MD Initiated Contact with Patient 03/07/15 [phone]     Chief Complaint  Patient presents with  . Cerebrovascular Accident     (Consider location/radiation/quality/duration/timing/severity/associated sxs/prior Treatment) HPI  69 year old male with multiple medical problems including CAD, atrial flutter, and a recent stroke one month ago presents with recurrent strokelike symptoms. Last night he went to bed around 11 PM and was normal. When he woke up at 7:30 AM he was feeling dizzy/lightheaded and was having difficulty speaking with slurred speech. Patient states his wife thought his speech was very slurred this morning, he thinks this is improved but he is still having some trouble speaking . Patient also feels confused when people are talking to him. He states the symptoms are exactly the same as one month ago when he had an MRI that confirmed a new stroke. The patient denies any focal weakness. Whenever the patient stands up his dizziness is worse although he is still having dizziness at rest. At times feels like his in a pass out, at other times it feels like the room is spinning. He was able to walk to the bathroom this morning but felt off balance.  Past Medical History  Diagnosis Date  . Coronary artery disease     a. Low level exercise Lex MV 3/14: low risk, EF 62%, inf defect 2/2 diaph attenuation vs artifiact, small area of scar possible, no ischemia  . Hyperlipidemia   . Hypertension   . History of seizure disorder   . History of atrial flutter   . Depression   . Back pain     persistent  . Hearing loss   . Diverticulitis   . H/O alcohol abuse   . Diabetes mellitus     controled by diet  . GERD (gastroesophageal reflux disease)   . Seizures   . Headache(784.0)   . Arthritis     of spine  . Sleep apnea     does not use CPAP  . Hx of echocardiogram     Echo 7/14:  Mod LVH, EF 55-60%, Gr 1 DD, mild MR,  PASP 36  . AKI (acute kidney injury)     a. 6/14: resolved after d/c ARB;  b. RA U/S 7/14: no RA stenosis  . Atrial flutter   . Stroke   . Shortness of breath   . Renal stone 04/15/2014   Past Surgical History  Procedure Laterality Date  . Partial colectomy      for diverticuli  . Orchiectomy    . Umbilical hernia repair    . Cervical fusion    . Coronary artery bypass graft  2005    LIMA graft to LAD,saphenous vein graft to diag.,circumflex, marginal,and to the RCA  . Cardiac catheterization    . Appendectomy    . Esophagogastroduodenoscopy  02/10/2012    Procedure: ESOPHAGOGASTRODUODENOSCOPY (EGD);  Surgeon: Cleotis Nipper, MD;  Location: [facility] ENDOSCOPY;  Service: Endoscopy;  Laterality: N/A;  . Foreign body removal  02/10/2012    Procedure: FOREIGN BODY REMOVAL;  Surgeon: Cleotis Nipper, MD;  Location: [place];  Service: Endoscopy;  Laterality: N/A;  . Back surgery    . Esophagogastroduodenoscopy  06/29/2012    Procedure: ESOPHAGOGASTRODUODENOSCOPY (EGD);  Surgeon: Winfield Cunas., MD;  Location: [facility];  Service: Endoscopy;  Laterality: N/A;  . Foreign body removal  06/29/2012    Procedure: FOREIGN BODY REMOVAL;  Surgeon:  Winfield Cunas., MD;  Location: [facility];  Service: Endoscopy;  Laterality: N/A;  . Savory dilation  06/29/2012    Procedure: SAVORY DILATION;  Surgeon: Winfield Cunas., MD;  Location: [facility];  Service: Endoscopy;  Laterality: N/A;  . Tee without cardioversion N/A 11/17/2013    Procedure: TRANSESOPHAGEAL ECHOCARDIOGRAM (TEE);  Surgeon: Larey Dresser, MD;  Location: [place];  Service: Cardiovascular;  Laterality: N/A;  . Cystoscopy/retrograde/ureteroscopy/stone extraction with basket Left 04/16/2014    Procedure: CYSTOSCOPY/RETROGRADE/LEFT URETEROSCOPY/STONE EXTRACTION WITH BASKET AND LASER LITHOTRIPSY;  Surgeon: Malka So, MD;  Location: [facility];  Service: Urology;  Laterality: Left;  With STENT  . Left heart catheterization  with coronary angiogram N/A 04/02/2012    Procedure: LEFT HEART CATHETERIZATION WITH CORONARY ANGIOGRAM;  Surgeon: Sinclair Grooms, MD;  Location: [facility] CATH LAB;  Service: Cardiovascular;  Laterality: N/A;  . Loop recorder implant N/A 11/17/2013    Procedure: LOOP RECORDER IMPLANT;  Surgeon: Deboraha Sprang, MD;  Location: [facility] CATH LAB;  Service: Cardiovascular;  Laterality: N/A;   Family History  Problem Relation Age of Onset  . Emphysema Mother     was a smoker  . Asthma Mother   . Heart disease Father   . Prostate cancer Paternal Grandfather   . Pancreatic cancer Paternal Uncle   . Heart attack Neg Hx   . Stroke Paternal Grandmother   . Hypertension Mother    History  Substance Use Topics  . Smoking status: Never Smoker   . Smokeless tobacco: Never Used  . Alcohol Use: No     Comment: rare beer    Review of Systems  Constitutional: Negative for fever.  Cardiovascular: Negative for chest pain.  Gastrointestinal: Negative for vomiting.  Musculoskeletal: Positive for gait problem.  Neurological: Positive for dizziness, speech difficulty, weakness (Diffuse) and headaches (chronic, states he has a daily headache that is not worse than normal today.).  Psychiatric/Behavioral: Positive for confusion.  All other systems reviewed and are negative.     Allergies  Cephalexin; Doxycycline; Keflex; Lac bovis; Pentazocine lactate; Talwin; Tape; and Zanaflex  Home Medications   Prior to Admission medications   Medication Sig Start Date End Date Taking? Authorizing Provider  apixaban (ELIQUIS) 5 MG TABS tablet Take 1 tablet (5 mg total) by mouth 2 (two) times daily. 02/13/15   Archie Patten, MD  aspirin EC 81 MG EC tablet Take 1 tablet (81 mg total) by mouth daily. 02/13/15   Archie Patten, MD  atorvastatin (LIPITOR) 80 MG tablet Take 1 tablet (80 mg total) by mouth daily. Patient taking differently: Take 80 mg by mouth at bedtime.  10/29/13   Gay Filler Copland, MD  FLUoxetine  (PROZAC) 10 MG capsule Take 30 mg by mouth every morning.    Historical Provider, MD  furosemide (LASIX) 20 MG tablet TAKE 1-2 TABLETS BY MOUTH EVERY DAY AS NEEDED Patient taking differently: 40 mg DAILY in the morning (4am) 10/24/14   Peter M Martinique, MD  glucose blood (ONE TOUCH ULTRA TEST) test strip  03/15/14   Historical Provider, MD  methocarbamol (ROBAXIN) 750 MG tablet Take 750 mg by mouth every 8 (eight) hours as needed for muscle spasms.  04/10/14   Historical Provider, MD  metoprolol (LOPRESSOR) 50 MG tablet Take 0.5 tablets (25 mg total) by mouth 2 (two) times daily. 02/13/15   Archie Patten, MD  nitroGLYCERIN (NITROSTAT) 0.4 MG SL tablet Place 1 tablet (0.4 mg total) under the tongue  every 5 (five) minutes as needed. For chest pain 05/12/14   Peter M Martinique, MD  [place]  03/14/14   Historical Provider, MD  pantoprazole (PROTONIX) 40 MG tablet Take 40 mg by mouth 2 (two) times daily before a meal.    Historical Provider, MD  potassium citrate (UROCIT-K) 10 MEQ (1080 MG) SR tablet Take 10 mEq by mouth 3 (three) times daily with meals.  07/08/14   Historical Provider, MD  Pregabalin (LYRICA PO) Take by mouth.    Historical Provider, MD  sitaGLIPtin (JANUVIA) 100 MG tablet Take 100 mg by mouth every morning.     Historical Provider, MD  traZODone (DESYREL) 150 MG tablet Take 150 mg by mouth at bedtime.    Historical Provider, MD   BP 102/62 mmHg  Pulse 78  Temp(Src) 98.7 F (37.1 C) (Oral)  Resp 15  SpO2 94% Physical Exam  Constitutional: He is oriented to person, place, and time. He appears well-developed and well-nourished.  HENT:  Head: Normocephalic and atraumatic.  Right Ear: External ear normal.  Left Ear: External ear normal.  Nose: Nose normal.  Eyes: EOM are normal. Pupils are equal, round, and reactive to light. Right eye exhibits no discharge. Left eye exhibits no discharge.  Neck: Neck supple.  Cardiovascular: Normal rate, regular rhythm, normal  heart sounds and intact distal pulses.   Pulmonary/Chest: Effort normal.  Abdominal: Soft. He exhibits no distension. There is no tenderness.  Musculoskeletal: He exhibits no edema.  Neurological: He is alert and oriented to person, place, and time.  CN II-12 grossly intact. 5/5 strength in all 4 extremities. Grossly normal sensation. When trying to sit the patient up to stand up he states he feels too dizzy and that the room starts spinning and is unable to stand up. Normal finger to nose  Skin: Skin is warm and dry.  Nursing note and vitals reviewed.   ED Course  Procedures (including critical care time) Labs Review Labs Reviewed  PROTIME-INR - Abnormal; Notable for the following:    Prothrombin Time 15.5 (*)    All other components within normal limits  CBC - Abnormal; Notable for the following:    RBC 3.75 (*)    Hemoglobin 11.3 (*)    HCT 34.5 (*)    All other components within normal limits  COMPREHENSIVE METABOLIC PANEL - Abnormal; Notable for the following:    Glucose, Bld 155 (*)    All other components within normal limits  I-STAT CHEM 8, ED - Abnormal; Notable for the following:    BUN 22 (*)    Glucose, Bld 155 (*)    Hemoglobin 12.2 (*)    HCT 36.0 (*)    All other components within normal limits  ETHANOL  APTT  DIFFERENTIAL  URINE RAPID DRUG SCREEN, HOSP PERFORMED  URINALYSIS, ROUTINE W REFLEX MICROSCOPIC (NOT AT [facility])  I-STAT TROPOININ, ED    Imaging Review Ct Head Wo Contrast  03/07/2015   CLINICAL DATA:  Dizziness and difficulty speaking since last night.  EXAM: CT HEAD WITHOUT CONTRAST  TECHNIQUE: Contiguous axial images were obtained from the base of the skull through the vertex without intravenous contrast.  COMPARISON:  CT scan 02/11/2015  FINDINGS: Stable age related cerebral atrophy, ventriculomegaly and periventricular Morales matter disease. No extra-axial fluid collections are identified. No CT findings for acute hemispheric infarction or intracranial  hemorrhage. Remote ACA territory infarct on the right side. Stable septum pellucidum and remote lacunar-type basal ganglia infarcts. No  mass lesions. The brainstem and cerebellum are normal.  The bony structures are intact. The paranasal sinuses and mastoid air cells are clear.  IMPRESSION: 1. Remote infarcts but no definite acute intracranial findings. 2. Stable age related cerebral atrophy, ventriculomegaly and periventricular Brendlinger matter disease.   Electronically Signed   By: Marijo Sanes M.D.   On: 03/07/2015 11:30   Mr Brain Wo Contrast  03/07/2015   CLINICAL DATA:  Dizziness and slurred speech.  EXAM: MRI HEAD WITHOUT CONTRAST  TECHNIQUE: Multiplanar, multiecho pulse sequences of the brain and surrounding structures were obtained without intravenous contrast.  COMPARISON:  CT head 03/07/2015, MRI 02/11/2015  FINDINGS: Chronic infarct right frontal lobe with some hemorrhage over the convexity similar to the prior study. Restricted diffusion in the right frontal cortex anterior to the chronic infarct is unchanged from the recent MRI. This appears to be an area of extension of the infarct without change from the recent MRI. Some of the abnormal signal on diffusion-weighted imaging could be due to blood products . This area shows restricted diffusion and low signal on ADC map. No other areas of acute infarct  Generalized atrophy with chronic microvascular ischemia. No other areas of hemorrhage. Negative for mass or edema  Paranasal sinuses clear.  IMPRESSION: No significant change from 02/11/2015. Chronic hemorrhagic infarct right frontal lobe with area of restricted diffusion compatible with acute or subacute infarct along the superior anterior margin of the chronic infarct.  Atrophy and chronic microvascular ischemia.   Electronically Signed   By: Franchot Gallo M.D.   On: 03/07/2015 14:41     EKG Interpretation   Date/Time:  Tuesday March 07 2015 09:06:23 EDT Ventricular Rate:  77 PR Interval:   171 QRS Duration: 97 QT Interval:  440 QTC Calculation: 498 R Axis:   72 Text Interpretation:  Sinus rhythm Low voltage, precordial leads  Borderline prolonged QT interval Baseline wander in lead(s) V2 no  significant change since February 11 2015 Confirmed by Regenia Skeeter  MD, [place]  ([id number]) on 03/07/2015 9:30:02 AM      MDM   Final diagnoses:  Vertigo    Patient with similar symptoms to when he had a stroke last month. Feels somewhat better with low-dose meclizine here but speech is still slurred per the patient and wife. Discussed with neurology on call, Dr. Doy Mince, who request a noncontrast MRI and she will see the patient in the ER.  Patient's MRI shows no new infarct. MRI is very similar to one month ago. Patient's dizziness has resolved with small dose of Antivert. Most likely he has vertigo, although this would not exactly explain his slurred speech. However he is already maximally treated with L a quiz, and neurology recommends discharge home. Patient was ambulated, and ambulate without difficulty. Given that the low-dose Antivert seemed to help, will discharge with this as needed for vertigo-like symptoms. Otherwise follow-up closely with PCP.    Sherwood Gambler, MD 03/07/15 [place], MD 03/07/15 1536

## 2015-03-07 NOTE — ED Notes (Signed)
Pt able to ambulate with minimal assist.

## 2015-03-07 NOTE — ED Notes (Signed)
PT has left hearing aid in ear. PT did not have a second hearing aid upon arrival to room. Lanny Hurst EMT to check with EMS in EMS bay for second hearing aid. Unable to locate second hearing aid.

## 2015-03-07 NOTE — ED Notes (Signed)
Pt placed in gown and in bed. Pt monitored by pulse ox, bp cuff, and 12-lead. 

## 2015-03-07 NOTE — ED Notes (Addendum)
EMS - Patient coming from home with LKN of 2200 last night.  Hx of prior stroke.  C/o of dizziness, shaky in the lower extremities and difficulty speaking and had similar symptoms with prior stroke.  Patient has short-term memory loss but normally alert and oriented x 4.

## 2015-03-07 NOTE — ED Notes (Signed)
Pt returned from scans. Monitored by pulse ox, bp cuff, and 5-lead.

## 2015-03-07 NOTE — Discharge Instructions (Signed)
Dizziness Dizziness is a common problem. It is a feeling of unsteadiness or light-headedness. You may feel like you are about to faint. Dizziness can lead to injury if you stumble or fall. A person of any age group can suffer from dizziness, but dizziness is more common in older adults. CAUSES  Dizziness can be caused by many different things, including:  Middle ear problems.  Standing for too long.  Infections.  An allergic reaction.  Aging.  An emotional response to something, such as the sight of blood.  Side effects of medicines.  Tiredness.  Problems with circulation or blood pressure.  Excessive use of alcohol or medicines, or illegal drug use.  Breathing too fast (hyperventilation).  An irregular heart rhythm (arrhythmia).  A low red blood cell count (anemia).  Pregnancy.  Vomiting, diarrhea, fever, or other illnesses that cause body fluid loss (dehydration).  Diseases or conditions such as Parkinson's disease, high blood pressure (hypertension), diabetes, and thyroid problems.  Exposure to extreme heat. DIAGNOSIS  Your health care provider will ask about your symptoms, perform a physical exam, and perform an electrocardiogram (ECG) to record the electrical activity of your heart. Your health care provider may also perform other heart or blood tests to determine the cause of your dizziness. These may include:  Transthoracic echocardiogram (TTE). During echocardiography, sound waves are used to evaluate how blood flows through your heart.  Transesophageal echocardiogram (TEE).  Cardiac monitoring. This allows your health care provider to monitor your heart rate and rhythm in real time.  Holter monitor. This is a portable device that records your heartbeat and can help diagnose heart arrhythmias. It allows your health care provider to track your heart activity for several days if needed.  Stress tests by exercise or by giving medicine that makes the heart beat  faster. TREATMENT  Treatment of dizziness depends on the cause of your symptoms and can vary greatly. HOME CARE INSTRUCTIONS   Drink enough fluids to keep your urine clear or pale yellow. This is especially important in very hot weather. In older adults, it is also important in cold weather.  Take your medicine exactly as directed if your dizziness is caused by medicines. When taking blood pressure medicines, it is especially important to get up slowly.  Rise slowly from chairs and steady yourself until you feel okay.  In the morning, first sit up on the side of the bed. When you feel okay, stand slowly while holding onto something until you know your balance is fine.  Move your legs often if you need to stand in one place for a long time. Tighten and relax your muscles in your legs while standing.  Have someone stay with you for 1-2 days if dizziness continues to be a problem. Do this until you feel you are well enough to stay alone. Have the person call your health care provider if he or she notices changes in you that are concerning.  Do not drive or use heavy machinery if you feel dizzy.  Do not drink alcohol. SEEK IMMEDIATE MEDICAL CARE IF:   Your dizziness or light-headedness gets worse.  You feel nauseous or vomit.  You have problems talking, walking, or using your arms, hands, or legs.  You feel weak.  You are not thinking clearly or you have trouble forming sentences. It may take a friend or family member to notice this.  You have chest pain, abdominal pain, shortness of breath, or sweating.  Your vision changes.  You notice  any bleeding.  You have side effects from medicine that seems to be getting worse rather than better. MAKE SURE YOU:   Understand these instructions.  Will watch your condition.  Will get help right away if you are not doing well or get worse. Document Released: 02/05/2001 Document Revised: 08/17/2013 Document Reviewed: 03/01/2011 [facility]  Patient Information 2015 [place], [place]. This information is not intended to replace advice given to you by your health care provider. Make sure you discuss any questions you have with your health care provider.    Vertigo Vertigo means you feel like you or your surroundings are moving when they are not. Vertigo can be dangerous if it occurs when you are at work, driving, or performing difficult activities.  CAUSES  Vertigo occurs when there is a conflict of signals sent to your brain from the visual and sensory systems in your body. There are many different causes of vertigo, including:  Infections, especially in the inner ear.  A bad reaction to a drug or misuse of alcohol and medicines.  Withdrawal from drugs or alcohol.  Rapidly changing positions, such as lying down or rolling over in bed.  A migraine headache.  Decreased blood flow to the brain.  Increased pressure in the brain from a head injury, infection, tumor, or bleeding. SYMPTOMS  You may feel as though the world is spinning around or you are falling to the ground. Because your balance is upset, vertigo can cause nausea and vomiting. You may have involuntary eye movements (nystagmus). DIAGNOSIS  Vertigo is usually diagnosed by physical exam. If the cause of your vertigo is unknown, your caregiver may perform imaging tests, such as an MRI scan (magnetic resonance imaging). TREATMENT  Most cases of vertigo resolve on their own, without treatment. Depending on the cause, your caregiver may prescribe certain medicines. If your vertigo is related to body position issues, your caregiver may recommend movements or procedures to correct the problem. In rare cases, if your vertigo is caused by certain inner ear problems, you may need surgery. HOME CARE INSTRUCTIONS   Follow your caregiver's instructions.  Avoid driving.  Avoid operating heavy machinery.  Avoid performing any tasks that would be dangerous to you or others  during a vertigo episode.  Tell your caregiver if you notice that certain medicines seem to be causing your vertigo. Some of the medicines used to treat vertigo episodes can actually make them worse in some people. SEEK IMMEDIATE MEDICAL CARE IF:   Your medicines do not relieve your vertigo or are making it worse.  You develop problems with talking, walking, weakness, or using your arms, hands, or legs.  You develop severe headaches.  Your nausea or vomiting continues or gets worse.  You develop visual changes.  A family member notices behavioral changes.  Your condition gets worse. MAKE SURE YOU:  Understand these instructions.  Will watch your condition.  Will get help right away if you are not doing well or get worse. Document Released: 05/22/2005 Document Revised: 11/04/2011 Document Reviewed: 02/28/2011 [facility] Patient Information 2015 [place], [place]. This information is not intended to replace advice given to you by your health care provider. Make sure you discuss any questions you have with your health care provider.    Aphasia Aphasia is a neurological disorder caused by damage to the parts of the brain that control language. CAUSES  Aphasia is not a disease, but a symptom of brain damage. Aphasia is commonly seen in adults who have suffered a stroke.  Aphasia also can result from:  A brain tumor.  Infection.  Head injury.  A rare type of dementia called Primary Progressive Aphasia. Common types of dementia may be associated with aphasia but can also exist without language problems. SYMPTOMS  Primary signs of the disorder include:  Problems expressing oneself when speaking.  Trouble understanding speech.  Difficulty with reading and writing.  Speaking in short or incomplete sentences.  Speaking in sentences that don't make sense.  Speaking unrecognizable words.  Interpreting figurative language literally.  Writing sentences that don't make  sense. The type and severity of language problems depend on the precise location and extent of the damaged brain tissue. Aphasia can be divided into four broad categories:  Expressive aphasia - difficulty in conveying thoughts through speech or writing. The patient knows what they want to say, but cannot find the words they need.  Receptive aphasia - difficulty understanding spoken or written language. The patient hears the voice or sees the print but cannot make sense of the words.  Anomic or amnesia aphasia - difficulty in using the correct names for particular objects, people, places, or events. This is the least severe form of aphasia.  Global aphasia results from severe and extensive damage to the language areas of the brain. Patients lose almost all language function, both comprehension (understanding) and expression. They cannot speak, understand speech, read, or write. TREATMENT  Sometimes an individual will completely recover from aphasia without treatment. In most cases, language therapy should begin as soon as possible. Language therapy should be tailored to the individual needs of the patient. Therapy with a speech pathologist involves exercises in which patients:  Read.  Write.  Follow directions.  Repeat what they hear.  Computer-aided therapy may also be used. PROGNOSIS  The outcome of aphasia is difficult to predict. People who are younger or have less extensive brain damage do better. The location of the injury is also important. The location is a clue to prognosis. In general, patients tend to recover skills in language comprehension (understanding) more completely than those skills involving expression (speaking or writing). Document Released: 05/04/2002 Document Revised: 11/04/2011 Document Reviewed: 11/01/2013 [facility] Patient Information 2015 [place], [place]. This information is not intended to replace advice given to you by your health care provider. Make sure you  discuss any questions you have with your health care provider.

## 2015-03-07 NOTE — ED Notes (Signed)
Pt in MRI.

## 2015-03-07 NOTE — Consult Note (Signed)
Referring Physician: Regenia Skeeter    Chief Complaint: increased dysarthria and possible new stroke.   HPI:                                                                                                                                         [name] is an 68 y.o. male with history of Aflutter prior stroke on Eliquis. His most recent stroke one month ago in which he had symptoms very much like his stroke back in March.  He states he went to sleep at 2200 hours last night feeling fine.  He awoke around 0730 and he noted he felt dizzy.  His wife helped him up from the bed and noted he was "bobing up and down with his knees and she felt his speech was more slured and possibly  Having more difficulty getting his words out". Wife feels the symptoms are exactly the same as one month ago when he had an MRI that confirmed a new stroke. HE states he has taken his Eliquis daily and not missed any doses.  Currently patient feels his speech is slightly more slurred but he also feels his mouth is dry.     Date last known well: Date: 03/06/2015 Time last known well: Time: 22:00 tPA Given: No: out of window  Carotid US 02/13/15 Summary:  - Technically difficult due to respiratory interference and high bifurcations - Bilateral - 1% to 39% ICA stenosis. Vertebral artery flow is antegrade.  2 D echo 12/02/2014 Study Conclusions  - Left ventricle: The cavity size was normal. Wall thickness was normal. Systolic function was normal. The estimated ejection fraction was in the range of 55% to 60%. Doppler parameters are consistent with abnormal left ventricular relaxation (grade 1diastolic dysfunction). - Mitral valve: Calcified annulus. Mildly thickened leaflets . - Pulmonary arteries: PA peak pressure: 34 mm Hg (S).   02/12/2015 A1c--7.1,  LDL unable to obtain due to elevated TG.    Past Medical History  Diagnosis Date  . Coronary artery disease     a. Low level exercise Lex MV 3/14: low risk, EF 62%,  inf defect 2/2 diaph attenuation vs artifiact, small area of scar possible, no ischemia  . Hyperlipidemia   . Hypertension   . History of seizure disorder   . History of atrial flutter   . Depression   . Back pain     persistent  . Hearing loss   . Diverticulitis   . H/O alcohol abuse   . Diabetes mellitus     controled by diet  . GERD (gastroesophageal reflux disease)   . Seizures   . Headache(784.0)   . Arthritis     of spine  . Sleep apnea     does not use CPAP  . Hx of echocardiogram     Echo 7/14:  Mod LVH, EF 55-60%, Gr 1 DD, mild MR, PASP 36  .  AKI (acute kidney injury)     a. 6/14: resolved after d/c ARB;  b. RA U/S 7/14: no RA stenosis  . Atrial flutter   . Stroke   . Shortness of breath   . Renal stone 04/15/2014    Past Surgical History  Procedure Laterality Date  . Partial colectomy      for diverticuli  . Orchiectomy    . Umbilical hernia repair    . Cervical fusion    . Coronary artery bypass graft  2005    LIMA graft to LAD,saphenous vein graft to diag.,circumflex, marginal,and to the RCA  . Cardiac catheterization    . Appendectomy    . Esophagogastroduodenoscopy  02/10/2012    Procedure: ESOPHAGOGASTRODUODENOSCOPY (EGD);  Surgeon: Cleotis Nipper, MD;  Location: [facility] ENDOSCOPY;  Service: Endoscopy;  Laterality: N/A;  . Foreign body removal  02/10/2012    Procedure: FOREIGN BODY REMOVAL;  Surgeon: Cleotis Nipper, MD;  Location: [place];  Service: Endoscopy;  Laterality: N/A;  . Back surgery    . Esophagogastroduodenoscopy  06/29/2012    Procedure: ESOPHAGOGASTRODUODENOSCOPY (EGD);  Surgeon: Winfield Cunas., MD;  Location: [facility] ENDOSCOPY;  Service: Endoscopy;  Laterality: N/A;  . Foreign body removal  06/29/2012    Procedure: FOREIGN BODY REMOVAL;  Surgeon: Winfield Cunas., MD;  Location: [facility] ENDOSCOPY;  Service: Endoscopy;  Laterality: N/A;  . Savory dilation  06/29/2012    Procedure: SAVORY DILATION;  Surgeon: Winfield Cunas., MD;   Location: [facility] ENDOSCOPY;  Service: Endoscopy;  Laterality: N/A;  . Tee without cardioversion N/A 11/17/2013    Procedure: TRANSESOPHAGEAL ECHOCARDIOGRAM (TEE);  Surgeon: Larey Dresser, MD;  Location: [place];  Service: Cardiovascular;  Laterality: N/A;  . Cystoscopy/retrograde/ureteroscopy/stone extraction with basket Left 04/16/2014    Procedure: CYSTOSCOPY/RETROGRADE/LEFT URETEROSCOPY/STONE EXTRACTION WITH BASKET AND LASER LITHOTRIPSY;  Surgeon: Malka So, MD;  Location: [facility] ORS;  Service: Urology;  Laterality: Left;  With STENT  . Left heart catheterization with coronary angiogram N/A 04/02/2012    Procedure: LEFT HEART CATHETERIZATION WITH CORONARY ANGIOGRAM;  Surgeon: Sinclair Grooms, MD;  [facility] C[facility] LAB;  Service: Cardiovascular;  Laterality: N/A;  . Loop recorder implant N/A 11/17/2013    Procedure: LOOP RECORDER IMPLANT;  Surgeon: Deboraha Sprang, MD;  Location: [facility] CATH LAB;  Service: Cardiovascular;  Laterality: N/A;    Family History  Problem Relation Age of Onset  . Emphysema Mother     was a smoker  . Asthma Mother   . Heart disease Father   . Prostate cancer Paternal Grandfather   . Pancreatic cancer Paternal Uncle   . Heart attack Neg Hx   . Stroke Paternal Grandmother   . Hypertension Mother    Social History:  reports that he has never smoked. He has never used smokeless tobacco. He reports that he does not drink alcohol or use illicit drugs.  Allergies:  Allergies  Allergen Reactions  . Cephalexin     Unknown  . Doxycycline     Unknown  . Keflex [Cephalexin]   . Lac Bovis Other (See Comments)    lactose intolerant  . Pentazocine Lactate     He passed out- he had a seizure.  This occurred around 2000  . Talwin [Pentazocine]   . Tape Other (See Comments)    Paper tape only please.  . Zanaflex [Tizanidine Hcl] Other (See Comments)    Lightheaded and dizzy    Medications:  No current facility-administered medications for this encounter.   Current Outpatient Prescriptions  Medication Sig Dispense Refill  . acetaminophen (TYLENOL) 500 MG tablet Take 1,000 mg by mouth every 12 (twelve) hours as needed for mild pain.    Marland Kitchen apixaban (ELIQUIS) 5 MG TABS tablet Take 1 tablet (5 mg total) by mouth 2 (two) times daily. 60 tablet 0  . aspirin EC 81 MG EC tablet Take 1 tablet (81 mg total) by mouth daily. 30 tablet 0  . atorvastatin (LIPITOR) 80 MG tablet Take 1 tablet (80 mg total) by mouth daily. (Patient taking differently: Take 80 mg by mouth at bedtime. ) 30 tablet 11  . FLUoxetine (PROZAC) 10 MG capsule Take 30 mg by mouth every morning.    . fluticasone (FLONASE) 50 MCG/ACT nasal spray Place 2 sprays into both nostrils daily.    . furosemide (LASIX) 20 MG tablet TAKE 1-2 TABLETS BY MOUTH EVERY DAY AS NEEDED (Patient taking differently: 40 mg DAILY in the morning (4am)) 60 tablet 3  . glucose blood (ONE TOUCH ULTRA TEST) test strip     . methocarbamol (ROBAXIN) 750 MG tablet Take 750 mg by mouth every 8 (eight) hours as needed for muscle spasms.     . metoprolol (LOPRESSOR) 50 MG tablet Take 0.5 tablets (25 mg total) by mouth 2 (two) times daily. 60 tablet 3  . nitroGLYCERIN (NITROSTAT) 0.4 MG SL tablet Place 1 tablet (0.4 mg total) under the tongue every 5 (five) minutes as needed. For chest pain 25 tablet 0  . ONETOUCH DELICA LANCETS FINE MISC     . pantoprazole (PROTONIX) 40 MG tablet Take 40 mg by mouth 2 (two) times daily before a meal.    . potassium citrate (UROCIT-K) 10 MEQ (1080 MG) SR tablet Take 10 mEq by mouth 3 (three) times daily with meals.     . pregabalin (LYRICA) 50 MG capsule Take 50 mg by mouth 3 (three) times daily.    . sitaGLIPtin (JANUVIA) 100 MG tablet Take 100 mg by mouth every morning.     . traZODone (DESYREL) 150 MG tablet Take 150 mg by mouth at bedtime.    . meclizine (ANTIVERT) 12.5 MG tablet  Take 1 tablet (12.5 mg total) by mouth 2 (two) times daily as needed for dizziness. 15 tablet 0     ROS:                                                                                                                                       History obtained from the patient and and wife  General ROS: negative for - chills, fatigue, fever, night sweats, weight gain or weight loss Psychological ROS: negative for - behavioral disorder, hallucinations, memory difficulties, mood swings or suicidal ideation Ophthalmic ROS: negative for - blurry vision, double vision, eye pain or loss of vision ENT ROS: negative for - epistaxis, nasal discharge, oral lesions,  sore throat, tinnitus or vertigo Allergy and Immunology ROS: negative for - hives or itchy/watery eyes Hematological and Lymphatic ROS: negative for - bleeding problems, bruising or swollen lymph nodes Endocrine ROS: negative for - galactorrhea, hair pattern changes, polydipsia/polyuria or temperature intolerance Respiratory ROS: negative for - cough, hemoptysis, shortness of breath or wheezing Cardiovascular ROS: negative for - chest pain, dyspnea on exertion, edema or irregular heartbeat Gastrointestinal ROS: negative for - abdominal pain, diarrhea, hematemesis, nausea/vomiting or stool incontinence Genito-Urinary ROS: negative for - dysuria, hematuria, incontinence or urinary frequency/urgency Musculoskeletal ROS: negative for - joint swelling or muscular weakness Neurological ROS: as noted in HPI Dermatological ROS: negative for rash and skin lesion changes  Neurologic Examination:                                                                                                      Blood pressure 107/56, pulse 69, temperature 98.2 F (36.8 C), temperature source Oral, resp. rate 16, SpO2 97 %.  HEENT-  Normocephalic, no lesions, without obvious abnormality.  Normal external eye and conjunctiva.  Normal TM's bilaterally.  Normal auditory  canals and external ears. Normal external nose, mucus membranes and septum.  Normal pharynx. Cardiovascular- regular rate and rhythm, pulses palpable throughout   Lungs- chest clear, no wheezing, rales, normal symmetric air entry Abdomen- normal findings: bowel sounds normal Extremities- no edema Lymph-no adenopathy palpable Musculoskeletal-no joint tenderness, deformity or swelling Skin-warm and dry, no hyperpigmentation, vitiligo, or suspicious lesions  Neurological Examination Mental Status: Alert, oriented to month, year and hospital.  Speech dysarthric without evidence of aphasia.  Able to follow 3 step commands without difficulty. Cranial Nerves: II: Discs flat bilaterally; Visual fields grossly normal, pupils equal, round, reactive to light and accommodation III,IV, VI: ptosis not present, extra-ocular motions intact bilaterally V,VII: smile asymmetric on the left, facial light touch sensation normal bilaterally VIII: hearing normal bilaterally IX,X: uvula rises symmetrically XI: bilateral shoulder shrug XII: midline tongue extension Motor: Right : Upper extremity   5/5    Left:     Upper extremity   5/5  Lower extremity   5/5     Lower extremity   5/5 Tone and bulk:normal tone throughout; no atrophy noted Sensory: Pinprick and light touch intact throughout, bilaterally Deep Tendon Reflexes: 1+ and symmetric throughout UE no KJ or AJ Plantars: Right: downgoing   Left: downgoing Cerebellar: normal finger-to-nose,  and normal heel-to-shin test Gait: not tested due to safety concerns.        Lab Results: Basic Metabolic Panel:  Recent Labs Lab 03/07/15 0949 03/07/15 1000  NA 139 141  K 4.0 4.0  CL 104 103  CO2 27  --   GLUCOSE 155* 155*  BUN 18 22*  CREATININE 1.02 1.00  CALCIUM 9.1  --     Liver Function Tests:  Recent Labs Lab 03/07/15 0949  AST 22  ALT 24  ALKPHOS 85  BILITOT 0.5  PROT 7.1  ALBUMIN 3.6   No results for input(s): LIPASE, AMYLASE  in the last 168 hours. No results for input(s): AMMONIA in  the last 168 hours.  CBC:  Recent Labs Lab 03/07/15 0949 03/07/15 1000  WBC 6.0  --   NEUTROABS 4.4  --   HGB 11.3* 12.2*  HCT 34.5* 36.0*  MCV 92.0  --   PLT 197  --     Cardiac Enzymes: No results for input(s): CKTOTAL, CKMB, CKMBINDEX, TROPONINI in the last 168 hours.  Lipid Panel: No results for input(s): CHOL, TRIG, HDL, CHOLHDL, VLDL, LDLCALC in the last 168 hours.  CBG: No results for input(s): GLUCAP in the last 168 hours.  Microbiology: Results for orders placed or performed during the hospital encounter of 05/19/14  MRSA PCR Screening     Status: None   Collection Time: 05/19/14  7:07 PM  Result Value Ref Range Status   MRSA by PCR NEGATIVE NEGATIVE Final    Comment:        The GeneXpert MRSA Assay (FDA approved for NASAL specimens only), is one component of a comprehensive MRSA colonization surveillance program. It is not intended to diagnose MRSA infection nor to guide or monitor treatment for MRSA infections.    Coagulation Studies:  Recent Labs  03/07/15 0949  LABPROT 15.5*  INR 1.21    Imaging: Mr Brain Wo Contrast  03/07/2015   CLINICAL DATA:  Dizziness and slurred speech.  EXAM: MRI HEAD WITHOUT CONTRAST  TECHNIQUE: Multiplanar, multiecho pulse sequences of the brain and surrounding structures were obtained without intravenous contrast.  COMPARISON:  CT head 03/07/2015, MRI 02/11/2015  FINDINGS: Chronic infarct right frontal lobe with some hemorrhage over the convexity similar to the prior study. Restricted diffusion in the right frontal cortex anterior to the chronic infarct is unchanged from the recent MRI. This appears to be an area of extension of the infarct without change from the recent MRI. Some of the abnormal signal on diffusion-weighted imaging could be due to blood products . This area shows restricted diffusion and low signal on ADC map. No other areas of acute infarct   Generalized atrophy with chronic microvascular ischemia. No other areas of hemorrhage. Negative for mass or edema  Paranasal sinuses clear.  IMPRESSION: No significant change from 02/11/2015. Chronic hemorrhagic infarct right frontal lobe with area of restricted diffusion compatible with acute or subacute infarct along the superior anterior margin of the chronic infarct.  Atrophy and chronic microvascular ischemia.   Electronically Signed   By: Franchot Gallo M.D.   On: 03/07/2015 14:41     Etta Quill PA-C [facility] [phone]  03/07/2015, 2:06 PM   Patient seen and examined.  Clinical course and management discussed.  Necessary edits performed.  I agree with the above.  Assessment and plan of care developed and discussed below.     Assessment: 69 y.o. male with a history of stroke on Eliquis who presents with dizziness and increased dysarthria.  Concern was for recurrent infarct.  MRI of the brain personally reviewed and shows no new acute infarct.  Patient with recent stroke work up.  On Eliquis and compliant.    Stroke Risk Factors - diabetes mellitus, hyperlipidemia and hypertension  Recommendations: 1.  No further neurologic intervention is recommended at this time.  If further questions arise, please call or page at that time.  Thank you for allowing neurology to participate in the care of this patient.  Patient to keep scheduled follow up with neurology.   Alexis Goodell, MD [facility] [phone] 03/07/2015  4:09 PM

## 2015-03-08 ENCOUNTER — Encounter: Payer: Self-pay | Admitting: Cardiology

## 2015-03-08 ENCOUNTER — Other Ambulatory Visit: Payer: Self-pay

## 2015-03-08 DIAGNOSIS — I2510 Atherosclerotic heart disease of native coronary artery without angina pectoris: Secondary | ICD-10-CM | POA: Diagnosis not present

## 2015-03-08 DIAGNOSIS — K219 Gastro-esophageal reflux disease without esophagitis: Secondary | ICD-10-CM | POA: Diagnosis not present

## 2015-03-08 DIAGNOSIS — Z8673 Personal history of transient ischemic attack (TIA), and cerebral infarction without residual deficits: Secondary | ICD-10-CM | POA: Diagnosis not present

## 2015-03-08 DIAGNOSIS — E119 Type 2 diabetes mellitus without complications: Secondary | ICD-10-CM | POA: Diagnosis not present

## 2015-03-08 DIAGNOSIS — F329 Major depressive disorder, single episode, unspecified: Secondary | ICD-10-CM | POA: Diagnosis not present

## 2015-03-08 DIAGNOSIS — E785 Hyperlipidemia, unspecified: Secondary | ICD-10-CM | POA: Diagnosis not present

## 2015-03-08 NOTE — Patient Outreach (Signed)
[facility]) Care Management  03/08/2015  [name] 07/11/1946 [id number]   SUBJECTIVE:  Telephone call to patient/wife regarding EMMI stroke transition follow up.  Patients wife verified HIPAA for patient.  Wife states patient is asleep at time of call.  Wife states EMS was called for patient on yesterday 02/05/15 due to patient having symptoms of his legs shaking and some slurred speech.  Wife states patient was not admitted to hospital and was diagnosed with vertigo and sent home with prescription for Antivert.  Wife states patients symptoms have resolved. States patient has voiced being extremely tired and is presently resting.  Patient states patient is walking and talking without difficulty.  Wife states patient [facility].  No changes verbalized to patients care.  Wife states she has had to contact [facility] hospital to send records from patients admission and ED visit at the [facility] request.  Wife states patient was advised during his emergency room visit to scheduled follow up appointment with his primary MD.  Wife states she will set up appointment.   Wife states on of the patients hearing aids was lost during his most recent hospital stay.  Wife states she will be contacting the [place] for replacement.   Wife states she received EMMI stroke education material from [facility]. States she has not reviewed with patient at this time.   Wife verbalized agreement to have follow up telephone  call from [facility].   ASSESSMENT:  EMMI stroke transition program.  Wife managing patients care.   PLAN: RNCM will send additional EMMI education article to patient/wife on falls prevention. RNCM will follow up with patient / wife within 1 week.  Wife will report reviewing EMMI education material with patient by next outreach call.  Wife or patient will report 3 signs and symptoms of stroke Wife will report patient has been taking his medication as prescribed by his doctor.     Quinn Plowman RN,BSN,CCM [place] Coordinator [phone]

## 2015-03-09 ENCOUNTER — Encounter: Payer: Self-pay | Admitting: Internal Medicine

## 2015-03-09 DIAGNOSIS — I2510 Atherosclerotic heart disease of native coronary artery without angina pectoris: Secondary | ICD-10-CM | POA: Diagnosis not present

## 2015-03-09 DIAGNOSIS — F329 Major depressive disorder, single episode, unspecified: Secondary | ICD-10-CM | POA: Diagnosis not present

## 2015-03-09 DIAGNOSIS — K219 Gastro-esophageal reflux disease without esophagitis: Secondary | ICD-10-CM | POA: Diagnosis not present

## 2015-03-09 DIAGNOSIS — Z8673 Personal history of transient ischemic attack (TIA), and cerebral infarction without residual deficits: Secondary | ICD-10-CM | POA: Diagnosis not present

## 2015-03-09 DIAGNOSIS — E119 Type 2 diabetes mellitus without complications: Secondary | ICD-10-CM | POA: Diagnosis not present

## 2015-03-09 DIAGNOSIS — E785 Hyperlipidemia, unspecified: Secondary | ICD-10-CM | POA: Diagnosis not present

## 2015-03-10 ENCOUNTER — Ambulatory Visit (INDEPENDENT_AMBULATORY_CARE_PROVIDER_SITE_OTHER): Payer: Medicare Other | Admitting: Emergency Medicine

## 2015-03-10 ENCOUNTER — Other Ambulatory Visit: Payer: Self-pay

## 2015-03-10 VITALS — BP 134/82 | HR 81 | Temp 98.1°F | Resp 17 | Ht 65.0 in | Wt 221.0 lb

## 2015-03-10 DIAGNOSIS — I2699 Other pulmonary embolism without acute cor pulmonale: Secondary | ICD-10-CM | POA: Diagnosis not present

## 2015-03-10 DIAGNOSIS — R4701 Aphasia: Secondary | ICD-10-CM | POA: Diagnosis not present

## 2015-03-10 DIAGNOSIS — I639 Cerebral infarction, unspecified: Secondary | ICD-10-CM

## 2015-03-10 MED ORDER — NITROGLYCERIN 0.4 MG SL SUBL: 0.4000 mg | SUBLINGUAL_TABLET | SUBLINGUAL | Status: DC | PRN

## 2015-03-10 NOTE — Progress Notes (Addendum)
This chart was scribed for Jacob Queen, MD by Marti Sleigh, Medical Scribe. This patient was seen in Room 9 and the patient's care was started at 2:01 PM.   Chief Complaint:  Chief Complaint  Patient presents with  . Follow-up    stroke [name]  . Depression    HPI: [name] is a 69 y.o. male with a past hx of CVA who reports to [facility] today for hospital follow up. Pt's wife states that she called EMS on 03/07/15 when her husband was having muscle weakness, dizziness, and slurred speech. Pt had an MRI of the brain in the ER which showed no new findings from previous MRIs. Pt is taking a baby aspirin and eliquis daily. Pt's neurologist is Dr. Melanee Spry.  Pt's wife states the pt has had a more difficult time that usual finding his words and has had intermittent slurred speech since the last incident.   Past Medical History  Diagnosis Date  . Coronary artery disease     a. Low level exercise Lex MV 3/14: low risk, EF 62%, inf defect 2/2 diaph attenuation vs artifiact, small area of scar possible, no ischemia  . Hyperlipidemia   . Hypertension   . History of seizure disorder   . History of atrial flutter   . Depression   . Back pain     persistent  . Hearing loss   . Diverticulitis   . H/O alcohol abuse   . Diabetes mellitus     controled by diet  . GERD (gastroesophageal reflux disease)   . Seizures   . Headache(784.0)   . Arthritis     of spine  . Sleep apnea     does not use CPAP  . Hx of echocardiogram     Echo 7/14:  Mod LVH, EF 55-60%, Gr 1 DD, mild MR, PASP 36  . AKI (acute kidney injury)     a. 6/14: resolved after d/c ARB;  b. RA U/S 7/14: no RA stenosis  . Atrial flutter   . Stroke   . Shortness of breath   . Renal stone 04/15/2014   Past Surgical History  Procedure Laterality Date  . Partial colectomy      for diverticuli  . Orchiectomy    . Umbilical hernia repair    . Cervical fusion    . Coronary artery bypass graft  2005    LIMA graft to  LAD,saphenous vein graft to diag.,circumflex, marginal,and to the RCA  . Cardiac catheterization    . Appendectomy    . Esophagogastroduodenoscopy  02/10/2012    Procedure: ESOPHAGOGASTRODUODENOSCOPY (EGD);  Surgeon: Cleotis Nipper, MD;  Location: [facility] ENDOSCOPY;  Service: Endoscopy;  Laterality: N/A;  . Foreign body removal  02/10/2012    Procedure: FOREIGN BODY REMOVAL;  Surgeon: Cleotis Nipper, MD;  Location: [place];  Service: Endoscopy;  Laterality: N/A;  . Back surgery    . Esophagogastroduodenoscopy  06/29/2012    Procedure: ESOPHAGOGASTRODUODENOSCOPY (EGD);  Surgeon: Winfield Cunas., MD;  Location: [facility];  Service: Endoscopy;  Laterality: N/A;  . Foreign body removal  06/29/2012    Procedure: FOREIGN BODY REMOVAL;  Surgeon: Winfield Cunas., MD;  Location: [facility] ENDOSCOPY;  Service: Endoscopy;  Laterality: N/A;  . Savory dilation  06/29/2012    Procedure: SAVORY DILATION;  Surgeon: Winfield Cunas., MD;  Location: [facility];  Service: Endoscopy;  Laterality: N/A;  . Tee without cardioversion N/A 11/17/2013    Procedure:  TRANSESOPHAGEAL ECHOCARDIOGRAM (TEE);  Surgeon: Larey Dresser, MD;  Location: [place];  Service: Cardiovascular;  Laterality: N/A;  . Cystoscopy/retrograde/ureteroscopy/stone extraction with basket Left 04/16/2014    Procedure: CYSTOSCOPY/RETROGRADE/LEFT URETEROSCOPY/STONE EXTRACTION WITH BASKET AND LASER LITHOTRIPSY;  Surgeon: Malka So, MD;  Location: [facility] ORS;  Service: Urology;  Laterality: Left;  With STENT  . Left heart catheterization with coronary angiogram N/A 04/02/2012    Procedure: LEFT HEART CATHETERIZATION WITH CORONARY ANGIOGRAM;  Surgeon: Sinclair Grooms, MD;  Location: [facility] CATH LAB;  Service: Cardiovascular;  Laterality: N/A;  . Loop recorder implant N/A 11/17/2013    Procedure: LOOP RECORDER IMPLANT;  Surgeon: Deboraha Sprang, MD;  Location: [facility] CATH LAB;  Service: Cardiovascular;  Laterality: N/A;   History   Social History  .  Marital Status: Married    Spouse Name: Clinical research associate  . Number of Children: 2  . Years of Education: college   Occupational History  . 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     Social History Main Topics  . Smoking status: Never Smoker   . Smokeless tobacco: Never Used  . Alcohol Use: No     Comment: rare beer  . Drug Use: No  . Sexual Activity: Not Currently   Other Topics Concern  . None   Social History Narrative   Patient lives at home with  wife      Patient drinks 2 cups of coffee a day.patient is left handed   Family History  Problem Relation Age of Onset  . Emphysema Mother     was a smoker  . Asthma Mother   . Heart disease Father   . Prostate cancer Paternal Grandfather   . Pancreatic cancer Paternal Uncle   . Heart attack Neg Hx   . Stroke Paternal Grandmother   . Hypertension Mother    Allergies  Allergen Reactions  . Cephalexin     Unknown  . Doxycycline     Unknown  . Keflex [Cephalexin]   . Lac Bovis Other (See Comments)    lactose intolerant  . Pentazocine Lactate     He passed out- he had a seizure.  This occurred around 2000  . Talwin [Pentazocine]   . Tape Other (See Comments)    Paper tape only please.  . Zanaflex [Tizanidine Hcl] Other (See Comments)    Lightheaded and dizzy   Prior to Admission medications   Medication Sig Start Date End Date Taking? Authorizing Provider  acetaminophen (TYLENOL) 500 MG tablet Take 1,000 mg by mouth every 12 (twelve) hours as needed for mild pain.   Yes Historical Provider, MD  apixaban (ELIQUIS) 5 MG TABS tablet Take 1 tablet (5 mg total) by mouth 2 (two) times daily. 02/13/15  Yes Archie Patten, MD  aspirin EC 81 MG EC tablet Take 1 tablet (81 mg total) by mouth daily. 02/13/15  Yes Archie Patten, MD  atorvastatin (LIPITOR) 80 MG tablet Take 1 tablet (80 mg total) by mouth daily. Patient taking differently: Take 80 mg by mouth at bedtime.  10/29/13  Yes Gay Filler Copland, MD  FLUoxetine (PROZAC) 10  MG capsule Take 30 mg by mouth every morning.   Yes Historical Provider, MD  fluticasone (FLONASE) 50 MCG/ACT nasal spray Place 2 sprays into both nostrils daily.   Yes Historical Provider, MD  furosemide (LASIX) 20 MG tablet TAKE 1-2 TABLETS BY MOUTH EVERY DAY AS NEEDED Patient taking differently: 40 mg DAILY in the  morning (4am) 10/24/14  Yes Peter M Martinique, MD  glucose blood (ONE TOUCH ULTRA TEST) test strip  03/15/14  Yes Historical Provider, MD  meclizine (ANTIVERT) 12.5 MG tablet Take 1 tablet (12.5 mg total) by mouth 2 (two) times daily as needed for dizziness. 03/07/15  Yes Sherwood Gambler, MD  methocarbamol (ROBAXIN) 750 MG tablet Take 750 mg by mouth every 8 (eight) hours as needed for muscle spasms.  04/10/14  Yes Historical Provider, MD  metoprolol (LOPRESSOR) 50 MG tablet Take 0.5 tablets (25 mg total) by mouth 2 (two) times daily. 02/13/15  Yes Archie Patten, MD  nitroGLYCERIN (NITROSTAT) 0.4 MG SL tablet Place 1 tablet (0.4 mg total) under the tongue every 5 (five) minutes as needed. For chest pain 05/12/14  Yes Peter M Martinique, MD  [place]  03/14/14  Yes Historical Provider, MD  pantoprazole (PROTONIX) 40 MG tablet Take 40 mg by mouth 2 (two) times daily before a meal.   Yes Historical Provider, MD  potassium citrate (UROCIT-K) 10 MEQ (1080 MG) SR tablet Take 10 mEq by mouth 3 (three) times daily with meals.  07/08/14  Yes Historical Provider, MD  pregabalin (LYRICA) 50 MG capsule Take 50 mg by mouth 3 (three) times daily.   Yes Historical Provider, MD  sitaGLIPtin (JANUVIA) 100 MG tablet Take 100 mg by mouth every morning.    Yes Historical Provider, MD  traZODone (DESYREL) 150 MG tablet Take 150 mg by mouth at bedtime.   Yes Historical Provider, MD     ROS: The patient denies fevers, chills, night sweats, unintentional weight loss, chest pain, palpitations.  Difficulty finding words.  All other systems have been reviewed and were otherwise negative with the  exception of those mentioned in the HPI and as above.    PHYSICAL EXAM: Filed Vitals:   03/10/15 1351  BP: 134/82  Pulse: 81  Temp: 98.1 F (36.7 C)  Resp: 17   Body mass index is 36.78 kg/(m^2).   General: Alert, no acute distress. On 02 and seated in a wheelchair. HEENT:  Normocephalic, atraumatic, oropharynx patent. Eye: Juliette Mangle [facility] Cardiovascular:  Regular rate and rhythm, no rubs murmurs or gallops.  No Carotid bruits, radial pulse intact. No pedal edema.  Respiratory: Clear to auscultation bilaterally.  No wheezes, rales, or rhonchi.  No cyanosis, no use of accessory musculature Abdominal: No organomegaly, abdomen is soft and non-tender, positive bowel sounds.  No masses. Musculoskeletal: Gait intact. No edema, tenderness Skin: No rashes. Neurologic: Facial musculature symmetric. He has diffuse weakness. He has no focal facial weakness. He does show signs of expressive and effective aphasia.  Psychiatric: Patient acts appropriately throughout our interaction. Lymphatic: No cervical or submandibular lymphadenopathy    LABS: Results for orders placed or performed during the hospital encounter of 03/07/15  Ethanol  Result Value Ref Range   Alcohol, Ethyl (B) <5 <5 mg/dL  Protime-INR  Result Value Ref Range   Prothrombin Time 15.5 (H) 11.6 - 15.2 seconds   INR 1.21 0.00 - 1.49  APTT  Result Value Ref Range   aPTT 32 24 - 37 seconds  CBC  Result Value Ref Range   WBC 6.0 4.0 - 10.5 K/uL   RBC 3.75 (L) 4.22 - 5.81 MIL/uL   Hemoglobin 11.3 (L) 13.0 - 17.0 g/dL   HCT 34.5 (L) 39.0 - 52.0 %   MCV 92.0 78.0 - 100.0 fL   MCH 30.1 26.0 - 34.0 pg   MCHC 32.8 30.0 -  36.0 g/dL   RDW 13.5 11.5 - 15.5 %   Platelets 197 150 - 400 K/uL  Differential  Result Value Ref Range   Neutrophils Relative % 74 43 - 77 %   Neutro Abs 4.4 1.7 - 7.7 K/uL   Lymphocytes Relative 14 12 - 46 %   Lymphs Abs 0.8 0.7 - 4.0 K/uL   Monocytes Relative 10 3 - 12 %   Monocytes Absolute 0.6 0.1  - 1.0 K/uL   Eosinophils Relative 2 0 - 5 %   Eosinophils Absolute 0.1 0.0 - 0.7 K/uL   Basophils Relative 0 0 - 1 %   Basophils Absolute 0.0 0.0 - 0.1 K/uL  Comprehensive metabolic panel  Result Value Ref Range   Sodium 139 135 - 145 mmol/L   Potassium 4.0 3.5 - 5.1 mmol/L   Chloride 104 101 - 111 mmol/L   CO2 27 22 - 32 mmol/L   Glucose, Bld 155 (H) 65 - 99 mg/dL   BUN 18 6 - 20 mg/dL   Creatinine, Ser 1.02 0.61 - 1.24 mg/dL   Calcium 9.1 8.9 - 10.3 mg/dL   Total Protein 7.1 6.5 - 8.1 g/dL   Albumin 3.6 3.5 - 5.0 g/dL   AST 22 15 - 41 U/L   ALT 24 17 - 63 U/L   Alkaline Phosphatase 85 38 - 126 U/L   Total Bilirubin 0.5 0.3 - 1.2 mg/dL   GFR calc non Af Amer >60 >60 mL/min   GFR calc Af Amer >60 >60 mL/min   Anion gap 8 5 - 15  Urine rapid drug screen (hosp performed)not at [facility]  Result Value Ref Range   Opiates NONE DETECTED NONE DETECTED   Cocaine NONE DETECTED NONE DETECTED   Benzodiazepines NONE DETECTED NONE DETECTED   Amphetamines NONE DETECTED NONE DETECTED   Tetrahydrocannabinol NONE DETECTED NONE DETECTED   Barbiturates NONE DETECTED NONE DETECTED  Urinalysis, Routine w reflex microscopic (not at [facility])  Result Value Ref Range   Color, Urine YELLOW YELLOW   APPearance CLEAR CLEAR   Specific Gravity, Urine 1.026 1.005 - 1.030   pH 5.0 5.0 - 8.0   Glucose, UA NEGATIVE NEGATIVE mg/dL   Hgb urine dipstick NEGATIVE NEGATIVE   Bilirubin Urine NEGATIVE NEGATIVE   Ketones, ur NEGATIVE NEGATIVE mg/dL   Protein, ur NEGATIVE NEGATIVE mg/dL   Urobilinogen, UA 0.2 0.0 - 1.0 mg/dL   Nitrite NEGATIVE NEGATIVE   Leukocytes, UA NEGATIVE NEGATIVE  I-Stat Chem 8, ED  (not at [facility])  Result Value Ref Range   Sodium 141 135 - 145 mmol/L   Potassium 4.0 3.5 - 5.1 mmol/L   Chloride 103 101 - 111 mmol/L   BUN 22 (H) 6 - 20 mg/dL   Creatinine, Ser 1.00 0.61 - 1.24 mg/dL   Glucose, Bld 155 (H) 65 - 99 mg/dL   Calcium, Ion 1.22 1.13 - 1.30 mmol/L   TCO2 25 0 - 100 mmol/L    Hemoglobin 12.2 (L) 13.0 - 17.0 g/dL   HCT 36.0 (L) 39.0 - 52.0 %  I-stat troponin, ED (not at [facility])  Result Value Ref Range   Troponin i, poc 0.00 0.00 - 0.08 ng/mL   Comment 3             EKG/XRAY:   Primary read interpreted by [facility]. [name] at [facility].None   ASSESSMENT/PLAN: This is a difficult situation. Patient has had a hemorrhagic stroke which appears stable but also has a history of pulmonary emboli.  He will continue on anticoagulation. Referral will be  Made to speech therapy to help with his expressive and receptive aphasia I did discuss this with the hospital neurologist to confirm patient needs to be on anticoagulation despite the hemorrhagic stroke I personally performed the services described in this documentation, which was scribed in my presence. The recorded information has been reviewed and is accurate.  Nena Jordan, MD  Gross sideeffects, risk and benefits, and alternatives of medications d/w patient. Patient is aware that all medications have potential sideeffects and we are unable to predict every sideeffect or drug-drug interaction that may occur.  Jacob Queen MD 03/10/2015 2:01 PM      Well iscarbonate L back

## 2015-03-13 ENCOUNTER — Telehealth: Payer: Self-pay | Admitting: Neurology

## 2015-03-13 ENCOUNTER — Other Ambulatory Visit: Payer: Self-pay | Admitting: Neurology

## 2015-03-13 DIAGNOSIS — I2510 Atherosclerotic heart disease of native coronary artery without angina pectoris: Secondary | ICD-10-CM | POA: Diagnosis not present

## 2015-03-13 DIAGNOSIS — E119 Type 2 diabetes mellitus without complications: Secondary | ICD-10-CM | POA: Diagnosis not present

## 2015-03-13 DIAGNOSIS — E785 Hyperlipidemia, unspecified: Secondary | ICD-10-CM | POA: Diagnosis not present

## 2015-03-13 DIAGNOSIS — K219 Gastro-esophageal reflux disease without esophagitis: Secondary | ICD-10-CM | POA: Diagnosis not present

## 2015-03-13 DIAGNOSIS — F329 Major depressive disorder, single episode, unspecified: Secondary | ICD-10-CM | POA: Diagnosis not present

## 2015-03-13 DIAGNOSIS — Z8673 Personal history of transient ischemic attack (TIA), and cerebral infarction without residual deficits: Secondary | ICD-10-CM | POA: Diagnosis not present

## 2015-03-13 NOTE — Telephone Encounter (Signed)
Pt is having a hard time with bipap, and needs a different mask if possible. Please call and advise  [phone].

## 2015-03-13 NOTE — Telephone Encounter (Signed)
Has hospital follow up with Dr Erlinda Hong in Aug

## 2015-03-13 NOTE — Telephone Encounter (Signed)
I spoke to wife and advised her to have husband get fitted for a mask at the DME and they will send [place] an order which we will fax and send back. She voiced understanding.

## 2015-03-14 DIAGNOSIS — F329 Major depressive disorder, single episode, unspecified: Secondary | ICD-10-CM | POA: Diagnosis not present

## 2015-03-14 DIAGNOSIS — K219 Gastro-esophageal reflux disease without esophagitis: Secondary | ICD-10-CM | POA: Diagnosis not present

## 2015-03-14 DIAGNOSIS — I2510 Atherosclerotic heart disease of native coronary artery without angina pectoris: Secondary | ICD-10-CM | POA: Diagnosis not present

## 2015-03-14 DIAGNOSIS — Z8673 Personal history of transient ischemic attack (TIA), and cerebral infarction without residual deficits: Secondary | ICD-10-CM | POA: Diagnosis not present

## 2015-03-14 DIAGNOSIS — E119 Type 2 diabetes mellitus without complications: Secondary | ICD-10-CM | POA: Diagnosis not present

## 2015-03-14 DIAGNOSIS — E785 Hyperlipidemia, unspecified: Secondary | ICD-10-CM | POA: Diagnosis not present

## 2015-03-15 ENCOUNTER — Other Ambulatory Visit: Payer: Self-pay | Admitting: Family Medicine

## 2015-03-15 ENCOUNTER — Other Ambulatory Visit: Payer: Self-pay

## 2015-03-15 DIAGNOSIS — E119 Type 2 diabetes mellitus without complications: Secondary | ICD-10-CM | POA: Diagnosis not present

## 2015-03-15 DIAGNOSIS — F329 Major depressive disorder, single episode, unspecified: Secondary | ICD-10-CM | POA: Diagnosis not present

## 2015-03-15 DIAGNOSIS — Z8673 Personal history of transient ischemic attack (TIA), and cerebral infarction without residual deficits: Secondary | ICD-10-CM | POA: Diagnosis not present

## 2015-03-15 DIAGNOSIS — I2510 Atherosclerotic heart disease of native coronary artery without angina pectoris: Secondary | ICD-10-CM | POA: Diagnosis not present

## 2015-03-15 DIAGNOSIS — E785 Hyperlipidemia, unspecified: Secondary | ICD-10-CM | POA: Diagnosis not present

## 2015-03-15 DIAGNOSIS — K219 Gastro-esophageal reflux disease without esophagitis: Secondary | ICD-10-CM | POA: Diagnosis not present

## 2015-03-15 NOTE — Patient Outreach (Signed)
[facility]) Care Management  03/15/2015  KAMERON BLETHEN Feb 20, 1946 [id number]   Telephone call to patient regarding follow up [place] stroke program outreach.  Unable to reach patient/spouse. HIPAA compliant voice message left with call back phone number.   PLAN: RNCM will await call back from patient/spouse or follow up with patient within 2 weeks.   Quinn Plowman RN,BSN,CCM [place] Coordinator [phone]

## 2015-03-16 ENCOUNTER — Other Ambulatory Visit: Payer: Self-pay

## 2015-03-16 DIAGNOSIS — I2510 Atherosclerotic heart disease of native coronary artery without angina pectoris: Secondary | ICD-10-CM | POA: Diagnosis not present

## 2015-03-16 DIAGNOSIS — Z8673 Personal history of transient ischemic attack (TIA), and cerebral infarction without residual deficits: Secondary | ICD-10-CM | POA: Diagnosis not present

## 2015-03-16 DIAGNOSIS — F329 Major depressive disorder, single episode, unspecified: Secondary | ICD-10-CM | POA: Diagnosis not present

## 2015-03-16 DIAGNOSIS — E119 Type 2 diabetes mellitus without complications: Secondary | ICD-10-CM | POA: Diagnosis not present

## 2015-03-16 DIAGNOSIS — E785 Hyperlipidemia, unspecified: Secondary | ICD-10-CM | POA: Diagnosis not present

## 2015-03-16 DIAGNOSIS — N200 Calculus of kidney: Secondary | ICD-10-CM | POA: Diagnosis not present

## 2015-03-16 DIAGNOSIS — K219 Gastro-esophageal reflux disease without esophagitis: Secondary | ICD-10-CM | POA: Diagnosis not present

## 2015-03-16 NOTE — Patient Outreach (Signed)
[facility]) Care Management  03/16/2015  MICAHEL OMLOR 24-Feb-1946 [id number]  SUBJECTIVE:  Received return call from patients wife, Robley [place].  Patient continues to give wife permission to discuss all medical information.  Wife states patient is having therapy today.  Wife states patient continues to have problems with his memory.  Wife reports, "he searches for words and has difficulty processing some information."  Wife reports patients energy is better. States patient is able to stay up longer before having to lay down.  Wife states patient saw his primary MD on 03/10/15.  States no changes to patients treatment plan.  Wife states patient still gets dizzy and continues to take the antivert as needed. Wife voices concern that she is unsure at times whether patient really needs the medication or if he is just asking for it.  Wife states that patient has had an alcohol and prescription drug problem for the past 20 years.  Wife states patient stopped drinking approximately 3 years ago. Wife states patient is seen by a pain management doctor at this time.  Wife reports patients hearing aid has been ordered.   Wife states she discussed with patients primary MD a referral for patient to psychiatrist.  Wife states doctor did not see a need to refer at this time. Wife states patients follow up appointment with primary is 3 months.  Wife states patient continues to pick his scabs.  States doctor is aware. Wife states she will be taking patient to the senior enrichment facility for possible involvement there. Wife states the [place] will assist with payment of these services and pay for transportation.    Wife reports EMMI education material was received and reviewed by she and patient. Wife states neither she or patient have questions regarding the education material.  States they were able to under stand it.   Wife verbalized agreement to receive follow up call from [facility] within 1 week.    ASSESSMENT: EMMI stroke program.  Wife continues to assist patient with all care.    PLAN: RNCM will follow up with patient within 1 week.    Quinn Plowman RN,BSN,CCM [place] Coordinator [phone]

## 2015-03-16 NOTE — Patient Outreach (Signed)
[facility]) Care Management  03/16/2015  [name] [birth year] [id number]  Secontd telephone outreach to patient/wife regarding EMMI stroke program update.  HIPAA compliant voice message left with call back phone number.   PLAN: RNCM will await return call from patient/wife  RNCM will attempt outreach within 1 week.  Jacob Plowman RN,BSN,CCM [place] Coordinator [phone]

## 2015-03-17 ENCOUNTER — Ambulatory Visit (INDEPENDENT_AMBULATORY_CARE_PROVIDER_SITE_OTHER): Payer: Medicare Other

## 2015-03-17 DIAGNOSIS — I639 Cerebral infarction, unspecified: Secondary | ICD-10-CM

## 2015-03-20 ENCOUNTER — Telehealth: Payer: Self-pay | Admitting: Internal Medicine

## 2015-03-20 DIAGNOSIS — K219 Gastro-esophageal reflux disease without esophagitis: Secondary | ICD-10-CM | POA: Diagnosis not present

## 2015-03-20 DIAGNOSIS — Z8673 Personal history of transient ischemic attack (TIA), and cerebral infarction without residual deficits: Secondary | ICD-10-CM | POA: Diagnosis not present

## 2015-03-20 DIAGNOSIS — E785 Hyperlipidemia, unspecified: Secondary | ICD-10-CM | POA: Diagnosis not present

## 2015-03-20 DIAGNOSIS — E119 Type 2 diabetes mellitus without complications: Secondary | ICD-10-CM | POA: Diagnosis not present

## 2015-03-20 DIAGNOSIS — I2510 Atherosclerotic heart disease of native coronary artery without angina pectoris: Secondary | ICD-10-CM | POA: Diagnosis not present

## 2015-03-20 DIAGNOSIS — F329 Major depressive disorder, single episode, unspecified: Secondary | ICD-10-CM | POA: Diagnosis not present

## 2015-03-20 NOTE — Telephone Encounter (Signed)
Spoke with patient's wife- clarified monthly summary reports are sent automatically if monitor is kept plugged in. No need to schedule anything at this time.

## 2015-03-20 NOTE — Telephone Encounter (Signed)
New Message  Pt received overdue letter, last sent remote check on 6/22 and is due to see Dr. Caryl Comes in MArch/2017. Pt wife wants to know what to do. Please call back and discuss.

## 2015-03-21 ENCOUNTER — Encounter: Payer: Self-pay | Admitting: Internal Medicine

## 2015-03-23 ENCOUNTER — Other Ambulatory Visit: Payer: Self-pay | Admitting: *Deleted

## 2015-03-23 DIAGNOSIS — N200 Calculus of kidney: Secondary | ICD-10-CM | POA: Diagnosis not present

## 2015-03-23 DIAGNOSIS — Z0000 Encounter for general adult medical examination without abnormal findings: Secondary | ICD-10-CM | POA: Diagnosis not present

## 2015-03-23 NOTE — Patient Outreach (Signed)
[facility]) Care Management  03/23/2015  [name] 10/19/1945 [id number]  EMMI-stroke follow up call: Spoke with patient's spouse who is speaking for patient. (patient has short term memory loss). HIPPA verification received from spouse/caregiver.  Spouse/caregiver states she is assisting patient now and has just finished visit at [place]. Patient continues to need use of walker and scooter for mobility. States he has had no falls since last contact with RN CM. No  or hospital admission for stroke symptoms since last admission.  Recent visit 03/22/15  to [facility] hospital in [place] for test on esophagus.   States patient is doing well and has had no change in medication regime.  PLAN: follow care plan as noted. Spouse/caregiver agrees to next telephonic follow up call from [place]. Sherrin Daisy, RN BSN [place] Management Coordinator [facility] Care Management  [phone]

## 2015-03-24 DIAGNOSIS — Z8673 Personal history of transient ischemic attack (TIA), and cerebral infarction without residual deficits: Secondary | ICD-10-CM | POA: Diagnosis not present

## 2015-03-24 DIAGNOSIS — K219 Gastro-esophageal reflux disease without esophagitis: Secondary | ICD-10-CM | POA: Diagnosis not present

## 2015-03-24 DIAGNOSIS — I2510 Atherosclerotic heart disease of native coronary artery without angina pectoris: Secondary | ICD-10-CM | POA: Diagnosis not present

## 2015-03-24 DIAGNOSIS — E785 Hyperlipidemia, unspecified: Secondary | ICD-10-CM | POA: Diagnosis not present

## 2015-03-24 DIAGNOSIS — E119 Type 2 diabetes mellitus without complications: Secondary | ICD-10-CM | POA: Diagnosis not present

## 2015-03-24 DIAGNOSIS — F329 Major depressive disorder, single episode, unspecified: Secondary | ICD-10-CM | POA: Diagnosis not present

## 2015-03-28 DIAGNOSIS — I2510 Atherosclerotic heart disease of native coronary artery without angina pectoris: Secondary | ICD-10-CM | POA: Diagnosis not present

## 2015-03-28 DIAGNOSIS — K219 Gastro-esophageal reflux disease without esophagitis: Secondary | ICD-10-CM | POA: Diagnosis not present

## 2015-03-28 DIAGNOSIS — E785 Hyperlipidemia, unspecified: Secondary | ICD-10-CM | POA: Diagnosis not present

## 2015-03-28 DIAGNOSIS — E119 Type 2 diabetes mellitus without complications: Secondary | ICD-10-CM | POA: Diagnosis not present

## 2015-03-28 DIAGNOSIS — Z8673 Personal history of transient ischemic attack (TIA), and cerebral infarction without residual deficits: Secondary | ICD-10-CM | POA: Diagnosis not present

## 2015-03-28 DIAGNOSIS — F329 Major depressive disorder, single episode, unspecified: Secondary | ICD-10-CM | POA: Diagnosis not present

## 2015-03-29 ENCOUNTER — Ambulatory Visit (INDEPENDENT_AMBULATORY_CARE_PROVIDER_SITE_OTHER): Payer: Medicare Other | Admitting: Adult Health

## 2015-03-29 ENCOUNTER — Telehealth: Payer: Self-pay

## 2015-03-29 ENCOUNTER — Encounter: Payer: Self-pay | Admitting: Adult Health

## 2015-03-29 VITALS — BP 105/68 | HR 75 | Ht 66.0 in | Wt 214.0 lb

## 2015-03-29 DIAGNOSIS — R413 Other amnesia: Secondary | ICD-10-CM | POA: Diagnosis not present

## 2015-03-29 DIAGNOSIS — I639 Cerebral infarction, unspecified: Secondary | ICD-10-CM | POA: Diagnosis not present

## 2015-03-29 DIAGNOSIS — Z8673 Personal history of transient ischemic attack (TIA), and cerebral infarction without residual deficits: Secondary | ICD-10-CM

## 2015-03-29 DIAGNOSIS — E781 Pure hyperglyceridemia: Secondary | ICD-10-CM

## 2015-03-29 DIAGNOSIS — R269 Unspecified abnormalities of gait and mobility: Secondary | ICD-10-CM | POA: Diagnosis not present

## 2015-03-29 NOTE — Progress Notes (Signed)
PATIENT: [name] DOB: 1945-09-12  REASON FOR VISIT: follow up-history of stroke, memory loss  HISTORY FROM: patient  HISTORY OF PRESENT ILLNESS: [name] is a 69 year old male with a history of stroke. He returns today for follow-up. The patient was taken to the ED in June for slurred speech and was found to have an additional stroke. His MRI revealed a chronic right ACA infarct in the right frontal lobe with a small area of acute infarct in the right frontal cortex over the convexity compatible with extension of infarction. The patient is on Eliquis and aspirin for stroke prevention. The patient blood pressure, diabetes and cholesterol are managed by his primary care provider. His blood pressure is in normal range today. In the hospital his hemoglobin A1c was elevated at 7.1%. The patient's lipid profile showed a high triglyceride level and LDL was unable to be calculated. The patient has followed up with his primary care provider however he states blood work was not drawn at that visit. The patient continues on Lipitor. Since his hospitalization in June the patient had an additional episode in July of dizziness. He was taken to the emergency room but it was felt that the dizziness was vertigo and not a symptom of an additional stroke. The patient reports that he is currently in physical therapy as well as speech therapy. The patient and his wife are looking at the enrichment program for the patient. The patient also reports some short-term memory loss since the strokes. He returns today for an evaluation  HISTORY 09/27/14: [name] is a 69 year old male with a history of OSA on BIPAP and multiple strokes. He returns today for follow-up. The patient is currently taking coumadin for blood clots. In October he had a PE and for that reason had to remove himself from the research study here at [facility]. The patient in currently on Lipitor for cholesterol. He states that lab work was recently drawn at the  [place] but he has not gotten the result. Patient does check his sugars they usually run between 90-140. He is currently taking Januvia. Unsure what is last hemoglobin A1C is. Patient blood pressure has been running <130 SBP. He takes bystolic. He denies any stroke-like symptoms. Wife reports that the left leg is weak from the stroke and she also noticed some short term memory loss after the stroke. He walks with a Rolator. Denies any falls. He has OSA on CPAP and is followed by Dr. Rexene Alberts- He has a visit next week to f/u with OSA.   REVIEW OF SYSTEMS: Out of a complete 14 system review of symptoms, the patient complains only of the following symptoms, and all other reviewed systems are negative.  Chills, fatigue, unexpected weight change, hearing loss, runny nose, trouble swallowing, drooling, leg swelling, choking, light sensitivity, heat intolerance, excessive thirst, excessive eating, diarrhea, insomnia, frequent waking, back pain, aching muscles, walking difficulty, wounds, itching, frequency of urination, memory loss, headache, speech difficulty, confusion, decreased concentration, depression, nervous/anxious  ALLERGIES: Allergies  Allergen Reactions  . Cephalexin     Unknown  . Doxycycline     Unknown  . Keflex [Cephalexin]   . Lac Bovis Other (See Comments)    lactose intolerant  . Pentazocine Lactate     He passed out- he had a seizure.  This occurred around 2000  . Talwin [Pentazocine]   . Tape Other (See Comments)    Paper tape only please.  [name] [Tizanidine Hcl] Other (See  Comments)    Lightheaded and dizzy    HOME MEDICATIONS: Outpatient Prescriptions Prior to Visit  Medication Sig Dispense Refill  . aspirin EC 81 MG EC tablet Take 1 tablet (81 mg total) by mouth daily. 30 tablet 0  . atorvastatin (LIPITOR) 80 MG tablet Take 1 tablet (80 mg total) by mouth daily. (Patient taking differently: Take 80 mg by mouth at bedtime. ) 30 tablet 11  . ELIQUIS 5 MG TABS tablet TAKE 1  TABLET(5 MG) BY MOUTH TWICE DAILY 60 tablet 1  . FLUoxetine (PROZAC) 10 MG capsule Take 30 mg by mouth every morning.    . fluticasone (FLONASE) 50 MCG/ACT nasal spray Place 2 sprays into both nostrils daily.    . furosemide (LASIX) 20 MG tablet TAKE 1-2 TABLETS BY MOUTH EVERY DAY AS NEEDED (Patient taking differently: 40 mg DAILY in the morning (4am)) 60 tablet 3  . glucose blood (ONE TOUCH ULTRA TEST) test strip     . meclizine (ANTIVERT) 12.5 MG tablet Take 1 tablet (12.5 mg total) by mouth 2 (two) times daily as needed for dizziness. 15 tablet 0  . methocarbamol (ROBAXIN) 750 MG tablet Take 750 mg by mouth every 8 (eight) hours as needed for muscle spasms.     . metoprolol (LOPRESSOR) 50 MG tablet Take 0.5 tablets (25 mg total) by mouth 2 (two) times daily. 60 tablet 3  . nitroGLYCERIN (NITROSTAT) 0.4 MG SL tablet Place 1 tablet (0.4 mg total) under the tongue every 5 (five) minutes as needed. For chest pain 25 tablet 1  . ONETOUCH DELICA LANCETS FINE MISC     . pantoprazole (PROTONIX) 40 MG tablet Take 40 mg by mouth 2 (two) times daily before a meal.    . potassium citrate (UROCIT-K) 10 MEQ (1080 MG) SR tablet Take 10 mEq by mouth 3 (three) times daily with meals.     . pregabalin (LYRICA) 50 MG capsule Take 50 mg by mouth 3 (three) times daily.    . sitaGLIPtin (JANUVIA) 100 MG tablet Take 100 mg by mouth every morning.     . traZODone (DESYREL) 150 MG tablet Take 150 mg by mouth at bedtime.    Marland Kitchen acetaminophen (TYLENOL) 500 MG tablet Take 1,000 mg by mouth every 12 (twelve) hours as needed for mild pain.     No facility-administered medications prior to visit.    PAST MEDICAL HISTORY: Past Medical History  Diagnosis Date  . Coronary artery disease     a. Low level exercise Lex MV 3/14: low risk, EF 62%, inf defect 2/2 diaph attenuation vs artifiact, small area of scar possible, no ischemia  . Hyperlipidemia   . Hypertension   . History of seizure disorder   . History of atrial  flutter   . Depression   . Back pain     persistent  . Hearing loss   . Diverticulitis   . H/O alcohol abuse   . Diabetes mellitus     controled by diet  . GERD (gastroesophageal reflux disease)   . Seizures   . Headache(784.0)   . Arthritis     of spine  . Sleep apnea     does not use CPAP  . Hx of echocardiogram     Echo 7/14:  Mod LVH, EF 55-60%, Gr 1 DD, mild MR, PASP 36  . AKI (acute kidney injury)     a. 6/14: resolved after d/c ARB;  b. RA U/S 7/14: no RA stenosis  . Atrial flutter   .  Stroke   . Shortness of breath   . Renal stone 04/15/2014    PAST SURGICAL HISTORY: Past Surgical History  Procedure Laterality Date  . Partial colectomy      for diverticuli  . Orchiectomy    . Umbilical hernia repair    . Cervical fusion    . Coronary artery bypass graft  2005    LIMA graft to LAD,saphenous vein graft to diag.,circumflex, marginal,and to the RCA  . Cardiac catheterization    . Appendectomy    . Esophagogastroduodenoscopy  02/10/2012    Procedure: ESOPHAGOGASTRODUODENOSCOPY (EGD);  Surgeon: Cleotis Nipper, MD;  Location[facility] ENDOSCOPY;  Service: Endoscopy;  Laterality: N/A;  . Foreign body removal  02/10/2012    Procedure: FOREIGN BODY REMOVAL;  Surgeon: Cleotis Nipper, MD;  Location: [facility];  Service: Endoscopy;  Laterality: N/A;  . Back surgery    . Esophagogastroduodenoscopy  06/29/2012    Procedure: ESOPHAGOGASTRODUODENOSCOPY (EGD);  Surgeon: Winfield Cunas., MD;  Location: [facility];  Service: Endoscopy;  Laterality: N/A;  . Foreign body removal  06/29/2012    Procedure: FOREIGN BODY REMOVAL;  Surgeon: Winfield Cunas., MD;  Location: [facility] ENDOSCOPY;  Service: Endoscopy;  Laterality: N/A;  . Savory dilation  06/29/2012    Procedure: SAVORY DILATION;  Surgeon: Winfield Cunas., MD;  Location: [facility];  Service: Endoscopy;  Laterality: N/A;  . Tee without cardioversion N/A 11/17/2013    Procedure: TRANSESOPHAGEAL ECHOCARDIOGRAM (TEE);   Surgeon: Larey Dresser, MD;  Location: [facility];  Service: Cardiovascular;  Laterality: N/A;  . Cystoscopy/retrograde/ureteroscopy/stone extraction with basket Left 04/16/2014    Procedure: CYSTOSCOPY/RETROGRADE/LEFT URETEROSCOPY/STONE EXTRACTION WITH BASKET AND LASER LITHOTRIPSY;  Surgeon: Malka So, MD;  Location: [facility] ORS;  Service: Urology;  Laterality: Left;  With STENT  . Left heart catheterization with coronary angiogram N/A 04/02/2012    Procedure: LEFT HEART CATHETERIZATION WITH CORONARY ANGIOGRAM;  Surgeon: Sinclair Grooms, MD;  Location: [facility] CATH LAB;  Service: Cardiovascular;  Laterality: N/A;  . Loop recorder implant N/A 11/17/2013    Procedure: LOOP RECORDER IMPLANT;  Surgeon: Deboraha Sprang, MD;  Location: [facility] CATH LAB;  Service: Cardiovascular;  Laterality: N/A;    FAMILY HISTORY: Family History  Problem Relation Age of Onset  . Emphysema Mother     was a smoker  . Asthma Mother   . Heart disease Father   . Prostate cancer Paternal Grandfather   . Pancreatic cancer Paternal Uncle   . Heart attack Neg Hx   . Stroke Paternal Grandmother   . Hypertension Mother     SOCIAL HISTORY: History   Social History  . Marital Status: Married    Spouse Name: Clinical research associate  . Number of Children: 2  . Years of Education: college   Occupational History  . 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     Social History Main Topics  . Smoking status: Never Smoker   . Smokeless tobacco: Never Used  . Alcohol Use: No     Comment: rare beer  . Drug Use: No  . Sexual Activity: Not Currently   Other Topics Concern  . Not on file   Social History Narrative   Patient lives at home with  wife      Patient drinks 2 cups of coffee a day.patient is left handed      PHYSICAL EXAM  Filed Vitals:   03/29/15 1101  BP: 105/68  Pulse: 75  Height: 5' 6"  (1.676 m)  Weight: 214 lb (97.07 kg)   Body mass index is 34.56 kg/(m^2).  Generalized: Well developed, in no acute distress,  patient is on portable oxygen  Neurological examination  Mentation: Alert oriented to time, place, history taking. Follows all commands speech and language fluent. MMSE 27/30 Cranial nerve II-XII: Pupils were equal round reactive to light. Extraocular movements were full, visual field were full on confrontational test. Facial sensation and strength were normal. Uvula tongue midline. Head turning and shoulder shrug  were normal and symmetric. Motor: The motor testing reveals 5 over 5 strength of all 4 extremities. Good symmetric motor tone is noted throughout.  Sensory: Sensory testing is intact to soft touch on all 4 extremities. No evidence of extinction is noted.  Coordination: Cerebellar testing reveals good finger-nose-finger and heel-to-shin bilaterally.  Gait and station: Patient uses a Rollator when and regulating. Tandem gait not attempted.    DIAGNOSTIC DATA (LABS, IMAGING, TESTING) - I reviewed patient records, labs, notes, testing and imaging myself where available.  Lab Results  Component Value Date   WBC 6.0 03/07/2015   HGB 12.2* 03/07/2015   HCT 36.0* 03/07/2015   MCV 92.0 03/07/2015   PLT 197 03/07/2015      Component Value Date/Time   NA 141 03/07/2015 1000   K 4.0 03/07/2015 1000   CL 103 03/07/2015 1000   CO2 27 03/07/2015 0949   GLUCOSE 155* 03/07/2015 1000   BUN 22* 03/07/2015 1000   CREATININE 1.00 03/07/2015 1000   CREATININE 0.94 05/30/2014 1237   CALCIUM 9.1 03/07/2015 0949   PROT 7.1 03/07/2015 0949   ALBUMIN 3.6 03/07/2015 0949   AST 22 03/07/2015 0949   ALT 24 03/07/2015 0949   ALKPHOS 85 03/07/2015 0949   BILITOT 0.5 03/07/2015 0949   GFRNONAA >60 03/07/2015 0949   GFRAA >60 03/07/2015 0949   Lab Results  Component Value Date   CHOL 195 02/12/2015   HDL 33* 02/12/2015   LDLCALC UNABLE TO CALCULATE IF TRIGLYCERIDE OVER 400 mg/dL 02/12/2015   TRIG 431* 02/12/2015   CHOLHDL 5.9 02/12/2015   Lab Results  Component Value Date   HGBA1C 7.1*  02/12/2015   No results found for: VITAMINB12 Lab Results  Component Value Date   TSH 2.44 12/08/2014      ASSESSMENT AND PLAN 69 y.o. year old male  has a past medical history of Coronary artery disease; Hyperlipidemia; Hypertension; History of seizure disorder; History of atrial flutter; Depression; Back pain; Hearing loss; Diverticulitis; H/O alcohol abuse; Diabetes mellitus; GERD (gastroesophageal reflux disease); Seizures; Headache(784.0); Arthritis; Sleep apnea; echocardiogram; AKI (acute kidney injury); Atrial flutter; Stroke; Shortness of breath; and Renal stone (04/15/2014). here with:  1. Stroke 2. Abnormality of gait 3. Memory loss  Continue Eliquis and aspirin. Maintain strict control of hypertension with blood pressure goal below 130/90, lipids with an LDL cholesterol goal below 100 mg percent. Keep Hemoglobin A1C < 6.5 % The patient's cholesterol has not been rechecked since the hospital visit. I will have the patient come in for blood work. Memory is stable- MMSE 27/30- will continue to monitor.  Patient advised that if he has any strokelike symptoms he should call 911 immediately.  Follow-up in 3-4 months with Dr.Xu   Ward Givens, MSN, NP-C 03/29/2015, 11:17 AM [facility] [address], [place], [place] [address] [phone]  Note: This document was prepared with digital dictation and possible smart phrase technology. Any transcriptional errors that result from this  process are unintentional.

## 2015-03-29 NOTE — Progress Notes (Signed)
Loop recorder 

## 2015-03-29 NOTE — Telephone Encounter (Signed)
Pt's wife dropped off form for patient that needs to be filled out and faxed to the [place] [facility] for Enrichment at [phone].  I have left these forms at the nurses desk.  For any questions call his wife, Mardene Celeste, at [phone]

## 2015-03-29 NOTE — Patient Instructions (Signed)
Maintain strict control of hypertension with blood pressure goal below 130/90, lipids with an LDL cholesterol goal below 100 mg percent. Keep Hemoglobin A1C < 6.5 % Come back to check cholesterol levels - no food for 8 hours prior to lab work.  If you have any stroke like symptoms call 911.

## 2015-03-29 NOTE — Progress Notes (Signed)
I agree with the above plan 

## 2015-03-30 ENCOUNTER — Telehealth: Payer: Self-pay | Admitting: Neurology

## 2015-03-30 ENCOUNTER — Other Ambulatory Visit: Payer: Medicare Other

## 2015-03-30 DIAGNOSIS — F329 Major depressive disorder, single episode, unspecified: Secondary | ICD-10-CM | POA: Diagnosis not present

## 2015-03-30 DIAGNOSIS — Z8673 Personal history of transient ischemic attack (TIA), and cerebral infarction without residual deficits: Secondary | ICD-10-CM | POA: Diagnosis not present

## 2015-03-30 DIAGNOSIS — E785 Hyperlipidemia, unspecified: Secondary | ICD-10-CM | POA: Diagnosis not present

## 2015-03-30 DIAGNOSIS — K219 Gastro-esophageal reflux disease without esophagitis: Secondary | ICD-10-CM | POA: Diagnosis not present

## 2015-03-30 DIAGNOSIS — I2510 Atherosclerotic heart disease of native coronary artery without angina pectoris: Secondary | ICD-10-CM | POA: Diagnosis not present

## 2015-03-30 DIAGNOSIS — E119 Type 2 diabetes mellitus without complications: Secondary | ICD-10-CM | POA: Diagnosis not present

## 2015-03-30 NOTE — Progress Notes (Signed)
This encounter was created in error - please disregard.

## 2015-03-30 NOTE — Addendum Note (Signed)
Addended by: Trudie Buckler on: 03/30/2015 11:15 AM   Modules accepted: Orders

## 2015-03-30 NOTE — Patient Outreach (Signed)
[place] [facility]) Care Management  03/30/2015  [name] 1946/04/28 [id number]  Telephone call to patient/spouse regarding EMMI stroke program.  Unable to reach.  HIPAA compliant voice message left with call back phone number.   PLAN:  RNCM will attempt 2nd call back to patient/spouse within 3 business days.   Quinn Plowman RN,BSN,CCM [place] Coordinator [phone]

## 2015-03-30 NOTE — Telephone Encounter (Signed)
[name]  Speech Thereapist with with [facility] is requesting verbal orders to continue speech therapy to address cognitive therapy. She can be reached at [phone].

## 2015-03-30 NOTE — Telephone Encounter (Signed)
In your box Dr. [name].

## 2015-03-31 ENCOUNTER — Telehealth: Payer: Self-pay

## 2015-03-31 ENCOUNTER — Other Ambulatory Visit (INDEPENDENT_AMBULATORY_CARE_PROVIDER_SITE_OTHER): Payer: Self-pay

## 2015-03-31 ENCOUNTER — Other Ambulatory Visit: Payer: Self-pay

## 2015-03-31 DIAGNOSIS — Z8673 Personal history of transient ischemic attack (TIA), and cerebral infarction without residual deficits: Secondary | ICD-10-CM

## 2015-03-31 DIAGNOSIS — E781 Pure hyperglyceridemia: Secondary | ICD-10-CM

## 2015-03-31 DIAGNOSIS — Z0289 Encounter for other administrative examinations: Secondary | ICD-10-CM

## 2015-03-31 MED ORDER — MECLIZINE HCL 12.5 MG PO TABS: 12.5000 mg | ORAL_TABLET | Freq: Two times a day (BID) | ORAL | Status: DC | PRN

## 2015-03-31 NOTE — Patient Outreach (Signed)
[place] [facility]) Care Management  03/31/2015  [name] 08-10-1946 [id number]  SUBJECTIVE:  Telephone call to patient/wife regarding EMMI stroke follow up.  Wife states patient is doing ok.  Wife states patient had follow up blood work today per neurology request.  Wife states patient was seen by neurology nurse practitioner on 03/29/15.  Wife states no change in medications or plan of care. Wife reports patient recently had esophagus test through the [place].  Wife reports patient has had spastic esophagus for approximately 6 years and [facility] has managed.  Wife report patient has scheduled follow up visit with neurologist and [place] doctor.  Wife states patient has not had any new stroke like symptoms.   Wife states patient continues to take his medications as prescribed by doctor.  Wife report patient continues with speech and physical therapy. Wife states she signed patient up for the senior enrichment center to assist with additional activities.    ASSESSMENT:  Wife managing patients care.  No further case management needs noted or expressed. Wife express awareness of signs and symptoms of stroke.  Wife reports ability to contact doctors after hours for patient if need arises.   PLAN:  RNCM will close patient to EMMI stoke transition program/ goals met.  RNCM will send closure letter to Dr. Mena Goes RN,BSN,CCM [place] Coordinator [phone]

## 2015-03-31 NOTE — Telephone Encounter (Signed)
Advise on refill.

## 2015-03-31 NOTE — Telephone Encounter (Signed)
Pt is requesting a refill of meclizine 12.48m.

## 2015-03-31 NOTE — Telephone Encounter (Signed)
I sent refill but if patient continues to have dizziness, he needs to return to his Neurologist for re-evaluation. Please let patient know. Thank you!

## 2015-03-31 NOTE — Telephone Encounter (Signed)
I completed as much of the form as possible per Dr Perfecto Kingdom request and put in his box to complete the rest.

## 2015-04-01 LAB — LIPID PANEL
Chol/HDL Ratio: 5.5 ratio units — ABNORMAL HIGH (ref 0.0–5.0)
Cholesterol, Total: 187 mg/dL (ref 100–199)
HDL: 34 mg/dL — ABNORMAL LOW (ref >39)
Triglycerides: 559 mg/dL (ref 0–149)

## 2015-04-03 ENCOUNTER — Telehealth: Payer: Self-pay

## 2015-04-03 DIAGNOSIS — K219 Gastro-esophageal reflux disease without esophagitis: Secondary | ICD-10-CM | POA: Diagnosis not present

## 2015-04-03 DIAGNOSIS — E119 Type 2 diabetes mellitus without complications: Secondary | ICD-10-CM | POA: Diagnosis not present

## 2015-04-03 DIAGNOSIS — E785 Hyperlipidemia, unspecified: Secondary | ICD-10-CM | POA: Diagnosis not present

## 2015-04-03 DIAGNOSIS — Z8673 Personal history of transient ischemic attack (TIA), and cerebral infarction without residual deficits: Secondary | ICD-10-CM | POA: Diagnosis not present

## 2015-04-03 DIAGNOSIS — F329 Major depressive disorder, single episode, unspecified: Secondary | ICD-10-CM | POA: Diagnosis not present

## 2015-04-03 DIAGNOSIS — I2510 Atherosclerotic heart disease of native coronary artery without angina pectoris: Secondary | ICD-10-CM | POA: Diagnosis not present

## 2015-04-03 NOTE — Telephone Encounter (Signed)
Thank you, Casandra.   Rosalin Hawking, MD PhD Stroke Neurology 04/03/2015 6:42 PM

## 2015-04-03 NOTE — Telephone Encounter (Signed)
Called and spoke to patient and his wife relayed Triglycerides were elevated . Relayed That I would be faxing to Dr. Perfecto Kingdom office and to be expecting a call from there office . Patient and his wife understood the process.

## 2015-04-03 NOTE — Telephone Encounter (Signed)
Spoke with wife, advised message.

## 2015-04-03 NOTE — Telephone Encounter (Signed)
Please put form in my box.

## 2015-04-03 NOTE — Telephone Encounter (Signed)
Dr. [name] out for a week. Form placed in nurses box. Can anyone help?

## 2015-04-03 NOTE — Telephone Encounter (Signed)
-----   Message from Ward Givens, NP sent at 04/03/2015  7:32 AM EDT ----- Triglycerides are elevated significantly. Please let patient know. We need to send over lab work to PCP for them to manage.

## 2015-04-03 NOTE — Telephone Encounter (Signed)
Returned Allison's call, gave verbal orders to continue speech therapy.

## 2015-04-04 ENCOUNTER — Telehealth: Payer: Self-pay | Admitting: Adult Health

## 2015-04-04 DIAGNOSIS — E785 Hyperlipidemia, unspecified: Secondary | ICD-10-CM | POA: Diagnosis not present

## 2015-04-04 DIAGNOSIS — I2510 Atherosclerotic heart disease of native coronary artery without angina pectoris: Secondary | ICD-10-CM | POA: Diagnosis not present

## 2015-04-04 DIAGNOSIS — F329 Major depressive disorder, single episode, unspecified: Secondary | ICD-10-CM | POA: Diagnosis not present

## 2015-04-04 DIAGNOSIS — K219 Gastro-esophageal reflux disease without esophagitis: Secondary | ICD-10-CM | POA: Diagnosis not present

## 2015-04-04 DIAGNOSIS — E119 Type 2 diabetes mellitus without complications: Secondary | ICD-10-CM | POA: Diagnosis not present

## 2015-04-04 DIAGNOSIS — Z8673 Personal history of transient ischemic attack (TIA), and cerebral infarction without residual deficits: Secondary | ICD-10-CM | POA: Diagnosis not present

## 2015-04-04 NOTE — Telephone Encounter (Signed)
The patient's triglycerides was elevated. His primary care sent me a message and recommended that we start him on fish oil supplement. Please instruct the patient to buy omega-3 fatty acids over-the-counter supplement. Following dosing directions on the bottle. He should make a follow-up appointment with his primary care.

## 2015-04-05 ENCOUNTER — Encounter: Payer: Self-pay | Admitting: Cardiology

## 2015-04-05 ENCOUNTER — Ambulatory Visit (INDEPENDENT_AMBULATORY_CARE_PROVIDER_SITE_OTHER): Payer: Medicare Other | Admitting: Cardiology

## 2015-04-05 ENCOUNTER — Encounter: Payer: Self-pay | Admitting: *Deleted

## 2015-04-05 VITALS — BP 110/60 | HR 80 | Ht 66.0 in | Wt 214.2 lb

## 2015-04-05 DIAGNOSIS — I2510 Atherosclerotic heart disease of native coronary artery without angina pectoris: Secondary | ICD-10-CM

## 2015-04-05 DIAGNOSIS — E785 Hyperlipidemia, unspecified: Secondary | ICD-10-CM | POA: Diagnosis not present

## 2015-04-05 DIAGNOSIS — I639 Cerebral infarction, unspecified: Secondary | ICD-10-CM

## 2015-04-05 DIAGNOSIS — K219 Gastro-esophageal reflux disease without esophagitis: Secondary | ICD-10-CM | POA: Diagnosis not present

## 2015-04-05 DIAGNOSIS — E119 Type 2 diabetes mellitus without complications: Secondary | ICD-10-CM | POA: Diagnosis not present

## 2015-04-05 DIAGNOSIS — F329 Major depressive disorder, single episode, unspecified: Secondary | ICD-10-CM | POA: Diagnosis not present

## 2015-04-05 DIAGNOSIS — Z8673 Personal history of transient ischemic attack (TIA), and cerebral infarction without residual deficits: Secondary | ICD-10-CM | POA: Diagnosis not present

## 2015-04-05 MED ORDER — FENOFIBRATE 145 MG PO TABS: 145.0000 mg | ORAL_TABLET | Freq: Every day | ORAL | Status: DC

## 2015-04-05 NOTE — Telephone Encounter (Signed)
Called and spoke to pt and advised him that his triglycerides were elevated. I advised him that Jinny Blossom, NP and Dr. [name] spoke and they advise the pt to start fish oil supplement, which he can buy over the counter, called omega-3 fatty acids, and to follow dosing instructions on bottle. I also advised him to follow up with PCP Dr. [name]. Pt verbalized understanding. Asked pt to call back with any questions or concerns.

## 2015-04-05 NOTE — Telephone Encounter (Signed)
Spoke with pt;s wife, form was given to pt.

## 2015-04-05 NOTE — Progress Notes (Signed)
04/05/2015 [name]   Jun 10, 1946  [id number]  Primary Physician [name], MD Primary Cardiologist: Dr. Martinique Electrophysiologist: Dr. Caryl Comes  Reason for Visit/CC: Routine F/U for CAD  HPI:  The patient is a 69 year old male followed by Dr. Martinique and also Dr. Caryl Comes. He has a history of coronary disease and is status post CABG in 2005. He has a history of hypertension and hyperlipidemia. In 2014 he was evaluated for symptoms of increasing dyspnea. He had a stress test Myoview study on 10/26/2012. This showed a small fixed inferior wall defect consistent with diaphragmatic attenuation versus a small area of scar. Ejection fraction was normal at 60%. There was no ischemia. He also had an echocardiogram. This demonstrated moderate LVH with normal systolic function. There was mild mitral insufficiency. Pulmonary pressure was estimated at 36 mmHg. He also has a history of PE, hypertension, OSA compliant with CPAP, chronic diastolic CHF and recent history of cryptogenic stroke and underwent implantation of a loop recorder by Dr. Caryl Comes 11/08/2014. He is on chronic anticoagulation therapy with  Eliquis. He also has carotid stenosis with evidence of 70% right internal carotid artery stenosis on the CTA in August 2015. This is being managed by neurology. He has also been on chronic home O2 (3L/min) over the past year. He has been followed by Dr. [name]. No evidence of CODP but concerns for possible asbestos toxicity. He denies any active wheezing.   He presents to clinic today for routine follow-up. He denies any symptoms of chest pain and also notes that he had no typical anginal symptoms prior to undergoing CABG in 2005. He denies any palpitations and no recent stroke-like symptoms. He has been fully compliant with his Eliquis and denies any abnormal bleeding.   His resting EKG today shows NSR w/o ischemia. HR 73 bpm.    Current Outpatient Prescriptions  Medication Sig Dispense Refill  . aspirin  EC 81 MG EC tablet Take 1 tablet (81 mg total) by mouth daily. 30 tablet 0  . atorvastatin (LIPITOR) 80 MG tablet Take 1 tablet (80 mg total) by mouth daily. (Patient taking differently: Take 80 mg by mouth at bedtime. ) 30 tablet 11  . ELIQUIS 5 MG TABS tablet TAKE 1 TABLET(5 MG) BY MOUTH TWICE DAILY 60 tablet 1  . FLUoxetine (PROZAC) 10 MG capsule Take 30 mg by mouth every morning.    . fluticasone (FLONASE) 50 MCG/ACT nasal spray Place 2 sprays into both nostrils daily.    . furosemide (LASIX) 20 MG tablet TAKE 1-2 TABLETS BY MOUTH EVERY DAY AS NEEDED (Patient taking differently: 40 mg DAILY in the morning (4am)) 60 tablet 3  . glucose blood (ONE TOUCH ULTRA TEST) test strip     . meclizine (ANTIVERT) 12.5 MG tablet Take 1 tablet (12.5 mg total) by mouth 2 (two) times daily as needed for dizziness. 30 tablet 0  . methocarbamol (ROBAXIN) 750 MG tablet Take 750 mg by mouth every 8 (eight) hours as needed for muscle spasms.     . metoprolol (LOPRESSOR) 50 MG tablet Take 0.5 tablets (25 mg total) by mouth 2 (two) times daily. 60 tablet 3  . nitroGLYCERIN (NITROSTAT) 0.4 MG SL tablet Place 1 tablet (0.4 mg total) under the tongue every 5 (five) minutes as needed. For chest pain 25 tablet 1  . omega-3 acid ethyl esters (LOVAZA) 1 G capsule Take 1 g by mouth daily.    [name] DELICA LANCETS FINE MISC     . pantoprazole (  PROTONIX) 40 MG tablet Take 40 mg by mouth 2 (two) times daily before a meal.    . potassium citrate (UROCIT-K) 10 MEQ (1080 MG) SR tablet Take 10 mEq by mouth 3 (three) times daily with meals.     . pregabalin (LYRICA) 50 MG capsule Take 50 mg by mouth 3 (three) times daily.    . sitaGLIPtin (JANUVIA) 100 MG tablet Take 100 mg by mouth every morning.     . traZODone (DESYREL) 150 MG tablet Take 150 mg by mouth at bedtime.     No current facility-administered medications for this visit.    Allergies  Allergen Reactions  . Cephalexin     Unknown  . Doxycycline     Unknown  .  Keflex [Cephalexin]   . Lac Bovis Other (See Comments)    lactose intolerant  . Pentazocine Lactate     He passed out- he had a seizure.  This occurred around 2000  . Talwin [Pentazocine]   . Tape Other (See Comments)    Paper tape only please.  [name] [Tizanidine Hcl] Other (See Comments)    Lightheaded and dizzy    Social History   Social History  . Marital Status: Married    Spouse Name: Clinical research associate  . Number of Children: 2  . Years of Education: college   Occupational History  . 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     Social History Main Topics  . Smoking status: Never Smoker   . Smokeless tobacco: Never Used  . Alcohol Use: No     Comment: rare beer  . Drug Use: No  . Sexual Activity: Not Currently   Other Topics Concern  . Not on file   Social History Narrative   Patient lives at home with  wife      Patient drinks 2 cups of coffee a day.patient is left handed     Review of Systems: General: negative for chills, fever, night sweats or weight changes.  Cardiovascular: negative for chest pain, dyspnea on exertion, edema, orthopnea, palpitations, paroxysmal nocturnal dyspnea or shortness of breath Dermatological: negative for rash Respiratory: negative for cough or wheezing Urologic: negative for hematuria Abdominal: negative for nausea, vomiting, diarrhea, bright red blood per rectum, melena, or hematemesis Neurologic: negative for visual changes, syncope, or dizziness All other systems reviewed and are otherwise negative except as noted above.    Blood pressure 110/60, pulse 80, height 5' 6"  (1.676 m), weight 214 lb 3.2 oz (97.16 kg).  General appearance: alert, cooperative and no distress Neck: no carotid bruit and no JVD Lungs: clear to auscultation bilaterally Heart: regular rate and rhythm, S1, S2 normal, no murmur, click, rub or gallop Extremities: no LEE Pulses: 2+ and symmetric Skin: warm and dry Neurologic: Grossly normal  EKG NSR. 73  bpm. No ischemia.   ASSESSMENT AND PLAN:   1. CAD: s/p CABG in 2005. He reports he did not have typical anginal symptoms prior to his CABG. His EKG shows no ischemia at rest. His last ischemic eval was in 2014 and showed no ischemia. Given his history of silent ischemia, we will order another NST to r/o ischemia. Continue ASA, statin and BB therapy.  2. Hypertriglyceridemia: recent lipid panel showed severely elevated triglycerides at 599. Will add 145 mg of fenofibrate. Continue Lipitor 80 mg. Will recheck direct LDL and HFTs in 8 weeks.   3. HTN: well controlled.  4. Cryptogenic Stroke: s/p loop recorder. No evidence of  afib documented. Continue Eliquis.   5. H/O PE: reports full compliance with Eliquis.    PLAN  Will order NST given history of CABG and silent coronary ischemia. Add fenofibrate for hypertriglyceridemia. F/U laboratory work in 8 weeks. F/U with Dr. Martinique in 6 months or sooner if abnormal NST.   Lyda Jester PA-C 04/05/2015 2:32 PM

## 2015-04-05 NOTE — Patient Instructions (Signed)
Your physician wants you to follow-up in: 6 MONTHS WITH DR Martinique You will receive a reminder letter in the mail two months in advance. If you don't receive a letter, please call our office to schedule the follow-up appointment.   Your physician has requested that you have a lexiscan myoview. For further information please visit [email or web address]. Please follow instruction sheet, as given.  START FENOFIBRATE 145 MG ONCE DAILY WITH FOOD  Your physician recommends that you return for lab work in: 8 WEEKS= DO NOT EAT PRIOR TO EATING

## 2015-04-06 DIAGNOSIS — E119 Type 2 diabetes mellitus without complications: Secondary | ICD-10-CM | POA: Diagnosis not present

## 2015-04-06 DIAGNOSIS — I2510 Atherosclerotic heart disease of native coronary artery without angina pectoris: Secondary | ICD-10-CM | POA: Diagnosis not present

## 2015-04-06 DIAGNOSIS — F329 Major depressive disorder, single episode, unspecified: Secondary | ICD-10-CM | POA: Diagnosis not present

## 2015-04-06 DIAGNOSIS — K219 Gastro-esophageal reflux disease without esophagitis: Secondary | ICD-10-CM | POA: Diagnosis not present

## 2015-04-06 DIAGNOSIS — Z8673 Personal history of transient ischemic attack (TIA), and cerebral infarction without residual deficits: Secondary | ICD-10-CM | POA: Diagnosis not present

## 2015-04-06 DIAGNOSIS — E785 Hyperlipidemia, unspecified: Secondary | ICD-10-CM | POA: Diagnosis not present

## 2015-04-07 ENCOUNTER — Encounter: Payer: Self-pay | Admitting: Neurology

## 2015-04-07 ENCOUNTER — Ambulatory Visit (INDEPENDENT_AMBULATORY_CARE_PROVIDER_SITE_OTHER): Payer: Medicare Other | Admitting: Neurology

## 2015-04-07 ENCOUNTER — Telehealth: Payer: Self-pay | Admitting: Neurology

## 2015-04-07 VITALS — BP 98/61 | HR 70 | Resp 20 | Ht 66.0 in | Wt 213.0 lb

## 2015-04-07 DIAGNOSIS — Z8673 Personal history of transient ischemic attack (TIA), and cerebral infarction without residual deficits: Secondary | ICD-10-CM | POA: Diagnosis not present

## 2015-04-07 DIAGNOSIS — G4733 Obstructive sleep apnea (adult) (pediatric): Secondary | ICD-10-CM

## 2015-04-07 DIAGNOSIS — Z9981 Dependence on supplemental oxygen: Secondary | ICD-10-CM

## 2015-04-07 DIAGNOSIS — I6930 Unspecified sequelae of cerebral infarction: Secondary | ICD-10-CM

## 2015-04-07 DIAGNOSIS — I639 Cerebral infarction, unspecified: Secondary | ICD-10-CM | POA: Diagnosis not present

## 2015-04-07 LAB — CUP PACEART REMOTE DEVICE CHECK: Date Time Interrogation Session: 20160812155502

## 2015-04-07 NOTE — Patient Instructions (Signed)
Keep up the good work.  I think you have done well.  Your A1c can be checked by your primary care physician at the [facility].  Drink more water. Use your BiPAP nightly, you're doing great with that.  Follow up with Dr. Erlinda Hong in Nov. I can see you back in 6 months.

## 2015-04-07 NOTE — Telephone Encounter (Signed)
I saw this pt has an appointment with me in Nov this year. However, I think he is a long term clinic pt of Dr. Leonie Man and Dr. Leonie Man enrolled him to the [facility] trial and has seen him several times in the hospital with the recent on in June this year. He also has follow up with Jinny Blossom this month which Dr. Leonie Man supervised the visit. I never saw him in clinic for follow up and the only once I saw him was for the end of [facility] visit in research department. I have check Dr. Clydene Fake schedule in November, it is not full yet. Could you please reschedule him with Dr. Leonie Man in November? Thanks. If you have any questions, please let me know.  Rosalin Hawking, MD PhD Stroke Neurology 04/07/2015 4:13 PM

## 2015-04-07 NOTE — Progress Notes (Signed)
Subjective:    Patient ID: [name] is a 69 y.o. male.  HPI      Interim history:  [name] is a very pleasant 69 year old left-handed gentleman with an underlying complex medical history of heart disease, hyperlipidemia, obesity, hypertension, history of seizure disorder, history of atrial flutter, depression, chronic back pain, hearing loss, diverticulitis, history of alcohol and prescription medicine abuse, diabetes, reflux disease, recurrent headaches, and stroke x 3 (in the 81W and embolic stroke in 09/9935), who presents for followup consultation of his obstructive sleep apnea, on treatment with BiPAP. The patient is accompanied by his wife again today. I last saw him on 10/06/2014, at which time he reported not being able to sleep through the night. He was having bouts of diarrhea throughout the night. He was advised to start using oxygen therapy 24-7 at 2 L/m at rest and 3 L/m with exercise, as per his pulmonologist, Dr. [name]. He was seen in the interim by our nurse practitioner, Ward Givens on 03/29/2015 for follow-up of his stroke and memory loss. I reviewed the note.  Today, 04/07/2015: I reviewed his BiPAP ST compliance data from 03/07/2015 through 04/05/2015 which is a total of 30 days during which time he used his machine every night with percent used days greater than 4 hours at 100%, indicating superb compliance with an average usage of 13 hours and 28 minutes, residual AHI low at 1.2 per hour, leak at times high with the 95th percentile at 35.2 L/m on a pressure of 16/8 cm with a rate of 10/m.  Today, 04/07/2015: He reports doing okay, but his wife provides almost the entire history today. He is compliant with treatment. He could not use the full face mask because of excess mucus at night and the need to be able to spit out mucus. He is back on using a nasal mask. There is some residual leaking of air. Unfortunately, in the interim, he was admitted to the hospital on 02/11/2015  through 02/13/2015 with new onset dysarthria and he was found to have an acute stroke in the right ACA territory. He was placed on aspirin in addition to Eliquis. I reviewed the hospital records including the discharge summary. Carotid Doppler studies showed no significant carotid artery stenosis. He had a brain MRI without contrast as well as a brain MRA without contrast on 02/11/2015: Chronic right ACA infarct in the right frontal lobe. There is now a small area of acute infarct in the right frontal cortex over the convexity compatible with extension of infarction.  Atrophy and chronic microvascular ischemic changes. Chronic infarct left pons has developed since 2015.  Severe stenosis left posterior cerebral artery is unchanged from the prior study. Right anterior cerebral artery is widely patent.  He also presented to the emergency room on 03/07/2015 with increase in dysarthria as well as dizziness. I reviewed the emergency room records. Neurology was consulted. MRI brain without contrast on 03/07/2015 showed: No significant change from 02/11/2015. Chronic hemorrhagic infarct right frontal lobe with area of restricted diffusion compatible with acute or subacute infarct along the superior anterior margin of the chronic infarct. Atrophy and chronic microvascular ischemia. His wife has signed him up for adult enrichment and he goes twice a week. She has found this very beneficial. He is trying to do exercises at home. He had home health therapies which were helpful. His appetite is good.   Previously:  I saw him on 04/05/2014, at which time he reported doing well with his  breathing and tolerating BiPAP therapy. He had had a CT angiogram head and neck and was encouraged to discuss the findings with Dr. Leonie Man. He was compliant with BiPAP therapy and felt better. I congratulated him on his excellent compliance.  I reviewed his compliance data from 09/03/2014 through 10/02/2014 which is a total of 30 days  during which time he used his machine every night with percent used days greater than 4 hours of 100%, indicating superb compliance. He is on BiPAP ST at 16/8 with a rate of 10. Residual AHI 0.4 per hour, leak acceptable with the 95th percentile at 22.4 L/m.   I first met him on 12/22/2013 at the request of my colleague Dr. Leonie Man, was him for his stroke, at which time the patient reported a prior diagnosis of obstructive sleep apnea over 5 years ago. He tried CPAP, but was not able to tolerate it, as he kept pulling the mask off. He then tried a dental appliance for about 2 years, which may have helped some and he did have several home sleep tests, per his dentist, but eventually, he stopped using the appliance, and it has been lost or chewed up by the dog. He reported snoring, daytime somnolence, and restless sleep. I suggested reevaluation for obstructive sleep apnea with a sleep study. He had a split-night sleep study on 01/24/2014. Baseline sleep efficiency was reduced at 44% with a long latency to sleep of 73.5 minutes and wake after sleep onset of 15.5 minutes with moderate sleep fragmentation noted. He had absence of slow-wave sleep and a mildly reduced amount of REM sleep. REM latency was mildly reduced at 67.5 minutes. He had no significant periodic leg movements of sleep. His total AHI was highly elevated at 126.9 per hour, baseline oxygen saturation was 91%, nadir was 79% in REM sleep. He was titrated on CPAP. Sleep efficiency was much improved and arousal index was much improved. He had a markedly increased amount of REM sleep at 44%. He also achieved deep sleep. He exhibited central respiratory events while on CPAP therapy with more than 50% of the respiratory events being central in nature. He was first tried on CPAP from 5-12 cm as well as different EPR settings. His AHI was not reduced significantly and he had persistent lower oxygen saturations in the low 90s to high 80s. He was therefore  switched to BiPAP standard mode and titrated from 12/8 cm to 14/8 cm but had residual central respiratory events was therefore placed on BiPAP ST. He was titrated from 12/7 cm to 14/7 cm and was also briefly tried on ASV. He was switched back to BiPAP ST. Final pressure setting was 16/8 cm with a rate of 10 per minute. I reviewed his compliance data from 02/15/2014 to 03/16/2014 which is a total of 30 days during which time he used his BiPAP every night with percent use more than 4 hours at 100%, indicating superb compliance. Average usage was 10 hours and 14 minutes, sitting as mentioned 16/8 cm with a rate of 10 with a residual AHI of only 6 per hour and very acceptable leak at 19.1 for the 95th percentile.  I reviewed his compliance data from 03/06/2014 through 04/04/2014 which is a total of 30 days, during which time he uses BiPAP every night. Percent used days greater than 4 hours was 96%, indicating excellent compliance, residual AHI 5.5 per hour, very acceptable, leak acceptable at 22.2 for the 95th percentile. Settings unchanged.  His Past Medical History Is  Significant For: Past Medical History  Diagnosis Date  . Coronary artery disease     a. Low level exercise Lex MV 3/14: low risk, EF 62%, inf defect 2/2 diaph attenuation vs artifiact, small area of scar possible, no ischemia  . Hyperlipidemia   . Hypertension   . History of seizure disorder   . History of atrial flutter   . Depression   . Back pain     persistent  . Hearing loss   . Diverticulitis   . H/O alcohol abuse   . Diabetes mellitus     controled by diet  . GERD (gastroesophageal reflux disease)   . Seizures   . Headache(784.0)   . Arthritis     of spine  . Sleep apnea     does not use CPAP  . Hx of echocardiogram     Echo 7/14:  Mod LVH, EF 55-60%, Gr 1 DD, mild MR, PASP 36  . AKI (acute kidney injury)     a. 6/14: resolved after d/c ARB;  b. RA U/S 7/14: no RA stenosis  . Atrial flutter   . Stroke   .  Shortness of breath   . Renal stone 04/15/2014    His Past Surgical History Is Significant For: Past Surgical History  Procedure Laterality Date  . Partial colectomy      for diverticuli  . Orchiectomy    . Umbilical hernia repair    . Cervical fusion    . Coronary artery bypass graft  2005    LIMA graft to LAD,saphenous vein graft to diag.,circumflex, marginal,and to the RCA  . Cardiac catheterization    . Appendectomy    . Esophagogastroduodenoscopy  02/10/2012    Procedure: ESOPHAGOGASTRODUODENOSCOPY (EGD);  Surgeon: Cleotis Nipper, MD;  Location: [facility] ENDOSCOPY;  Service: Endoscopy;  Laterality: N/A;  . Foreign body removal  02/10/2012    Procedure: FOREIGN BODY REMOVAL;  Surgeon: Cleotis Nipper, MD;  Location: [place];  Service: Endoscopy;  Laterality: N/A;  . Back surgery    . Esophagogastroduodenoscopy  06/29/2012    Procedure: ESOPHAGOGASTRODUODENOSCOPY (EGD);  Surgeon: Winfield Cunas., MD;  Location: [facility] ENDOSCOPY;  Service: Endoscopy;  Laterality: N/A;  . Foreign body removal  06/29/2012    Procedure: FOREIGN BODY REMOVAL;  Surgeon: Winfield Cunas., MD;  Location: [facility] ENDOSCOPY;  Service: Endoscopy;  Laterality: N/A;  . Savory dilation  06/29/2012    Procedure: SAVORY DILATION;  Surgeon: Winfield Cunas., MD;  Location: [facility] ENDOSCOPY;  Service: Endoscopy;  Laterality: N/A;  . Tee without cardioversion N/A 11/17/2013    Procedure: TRANSESOPHAGEAL ECHOCARDIOGRAM (TEE);  Surgeon: Larey Dresser, MD;  Location: [place];  Service: Cardiovascular;  Laterality: N/A;  . Cystoscopy/retrograde/ureteroscopy/stone extraction with basket Left 04/16/2014    Procedure: CYSTOSCOPY/RETROGRADE/LEFT URETEROSCOPY/STONE EXTRACTION WITH BASKET AND LASER LITHOTRIPSY;  Surgeon: Malka So, MD;  Location: [facility] ORS;  Service: Urology;  Laterality: Left;  With STENT  . Left heart catheterization with coronary angiogram N/A 04/02/2012    Procedure: LEFT HEART CATHETERIZATION WITH CORONARY  ANGIOGRAM;  Surgeon: Sinclair Grooms, MD;  Location: [facility] CATH LAB;  Service: Cardiovascular;  Laterality: N/A;  . Loop recorder implant N/A 11/17/2013    Procedure: LOOP RECORDER IMPLANT;  Surgeon: Deboraha Sprang, MD;  Location: [facility] CATH LAB;  Service: Cardiovascular;  Laterality: N/A;    His Family History Is Significant For: Family History  Problem Relation Age of Onset  . Emphysema Mother  was a smoker  . Asthma Mother   . Heart disease Father   . Prostate cancer Paternal Grandfather   . Pancreatic cancer Paternal Uncle   . Heart attack Neg Hx   . Stroke Paternal Grandmother   . Hypertension Mother     His Social History Is Significant For: Social History   Social History  . Marital Status: Married    Spouse Name: Clinical research associate  . Number of Children: 2  . Years of Education: college   Occupational History  . 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     Social History Main Topics  . Smoking status: Never Smoker   . Smokeless tobacco: Never Used  . Alcohol Use: No     Comment: rare beer  . Drug Use: No  . Sexual Activity: Not Currently   Other Topics Concern  . None   Social History Narrative   Patient lives at home with  wife      Patient drinks 2 cups of coffee a day.patient is left handed    His Allergies Are:  Allergies  Allergen Reactions  . Cephalexin     Unknown  . Doxycycline     Unknown  . Keflex [Cephalexin]   . Lac Bovis Other (See Comments)    lactose intolerant  . Pentazocine Lactate     He passed out- he had a seizure.  This occurred around 2000  . Talwin [Pentazocine]   . Tape Other (See Comments)    Paper tape only please.  . Zanaflex [Tizanidine Hcl] Other (See Comments)    Lightheaded and dizzy  :   His Current Medications Are:  Outpatient Encounter Prescriptions as of 04/07/2015  Medication Sig  . aspirin EC 81 MG EC tablet Take 1 tablet (81 mg total) by mouth daily.  Marland Kitchen atorvastatin (LIPITOR) 80 MG tablet Take 1 tablet (80 mg  total) by mouth daily. (Patient taking differently: Take 80 mg by mouth at bedtime. )  . ELIQUIS 5 MG TABS tablet TAKE 1 TABLET(5 MG) BY MOUTH TWICE DAILY  . fenofibrate (TRICOR) 145 MG tablet Take 1 tablet (145 mg total) by mouth daily.  Marland Kitchen FLUoxetine (PROZAC) 10 MG capsule Take 30 mg by mouth every morning.  . fluticasone (FLONASE) 50 MCG/ACT nasal spray Place 2 sprays into both nostrils daily.  . furosemide (LASIX) 20 MG tablet TAKE 1-2 TABLETS BY MOUTH EVERY DAY AS NEEDED (Patient taking differently: 40 mg DAILY in the morning (4am))  . glucose blood (ONE TOUCH ULTRA TEST) test strip   . meclizine (ANTIVERT) 12.5 MG tablet Take 1 tablet (12.5 mg total) by mouth 2 (two) times daily as needed for dizziness.  . methocarbamol (ROBAXIN) 750 MG tablet Take 750 mg by mouth every 8 (eight) hours as needed for muscle spasms.   . metoprolol (LOPRESSOR) 50 MG tablet Take 0.5 tablets (25 mg total) by mouth 2 (two) times daily.  . nitroGLYCERIN (NITROSTAT) 0.4 MG SL tablet Place 1 tablet (0.4 mg total) under the tongue every 5 (five) minutes as needed. For chest pain  . omega-3 acid ethyl esters (LOVAZA) 1 G capsule Take 1 g by mouth daily.  [name] DELICA LANCETS FINE MISC   . pantoprazole (PROTONIX) 40 MG tablet Take 40 mg by mouth 2 (two) times daily before a meal.  . potassium citrate (UROCIT-K) 10 MEQ (1080 MG) SR tablet Take 10 mEq by mouth 3 (three) times daily with meals.   . pregabalin (LYRICA) 50  MG capsule Take 50 mg by mouth 3 (three) times daily.  . sitaGLIPtin (JANUVIA) 100 MG tablet Take 100 mg by mouth every morning.   . traZODone (DESYREL) 150 MG tablet Take 150 mg by mouth at bedtime.   No facility-administered encounter medications on file as of 04/07/2015.  :  Review of Systems:  Out of a complete 14 point review of systems, all are reviewed and negative with the exception of these symptoms as listed below:   Review of Systems  Neurological:       Wife states that patient does  well with CPAP. They tried face mask due to patient being a mouth breather but patient was unable to tolerate.   All other systems reviewed and are negative.   Objective:  Neurologic Exam  Physical Exam Physical Examination:   Filed Vitals:   04/07/15 1115  BP: 98/61  Pulse: 70  Resp: 20   General Examination: The patient is a very pleasant 69 y.o. male in no acute distress. He appears mildly deconditioned and more frail, and adequately groomed. He is situated in a chair and on O2 via [place].    HEENT: Normocephalic, atraumatic, pupils are equal, round and reactive to light and accommodation. Extraocular tracking is good without limitation to gaze excursion or nystagmus noted. Normal smooth pursuit is noted. Hearing is impaired mildly. Face is symmetric with normal facial animation and normal facial sensation. Speech shows mild dysarthria. There is no hypophonia. There is no lip, neck/head, jaw or voice tremor. Neck is supple with full range of passive and active motion. There are no carotid bruits on auscultation. Oropharynx exam reveals: moderate mouth dryness, adequate dental hygiene and marked airway crowding, due to quite narrow airway entry, and swollen and erythematous soft palate including uvula which is enlarged. Mallampati is class II. Tongue protrudes centrally and palate elevates symmetrically. Tonsils are absent. Neck size is enlarged.    Chest: Clear to auscultation without wheezing, rhonchi or crackles noted.  Heart: S1+S2+0, regular and normal without murmurs, rubs or gallops noted. He has a loop recorder in place.   Abdomen: Soft, non-tender and non-distended with normal bowel sounds appreciated on auscultation.  Extremities: There is no pitting edema in the distal lower extremities bilaterally.   Skin: Warm and dry without trophic changes noted. There are no varicose veins.  Musculoskeletal: exam reveals no obvious joint deformities, tenderness or joint swelling or  erythema.   Neurologically:  Mental status: The patient is awake, alert and oriented in all 4 spheres. His immediate and remote memory, attention, language skills and fund of knowledge are fairly appropriate. There is no evidence of aphasia, agnosia, apraxia or anomia. Speech is mildly dysarthric. Thought process is linear but he does have some slowness in thinking and tends to turn to his wife for some of the answers. Mood is constricted and affect is normal today.  Cranial nerves II - XII are as described above under HEENT exam. In addition: shoulder shrug is normal with equal shoulder height noted. Motor exam: Normal bulk, strength and tone is noted on the R and he has unchanged 4+/5 degree of weakness in his left lower extremity with hip flexion and knee extension primarily, and perhaps a slight degree of weakness in the left upper extremity. Reflexes are about 1+ throughout. Fine motor skills are mildly impaired on the left. He does not have any overt dysmetria or intention tremor on cerebellar testing. Sensory exam is intact to light touch, pinprick, vibration and temperature sense  in the upper extremities but decrease to a mild degree to pinprick and vibration sense in the distal lower extremities bilaterally, largely unchanged. Gait, station and balance: He stands up slowly and walks slowly with his rolling walker. Tandem walk is not possible for him.   Assessment and Plan:  In summary, [name] is a very pleasant 69 year old male with an underlying complex medical history of heart disease, hyperlipidemia, obesity, hypertension, history of seizure disorder, history of atrial flutter, depression, chronic back pain, hearing loss, diverticulitis, history of alcohol and prescription medicine abuse in the past, diabetes, reflux disease, recurrent headaches, and recurrent strokes (first in the 78X, then embolic stroke with residual right lower extremity weakness in 2015 and more recent stroke in the  R ACA territory in 6/16), and chronic respiratory failure requiring oxygen 24-7, who presents for followup consultation of his OSA, established on BiPAP ST therapy (with supplemental O2). He has been fully compliant with treatment and is encouraged to continue with treatment and is greatly commended for trying to be compliant. He has overall felt better since starting BiPAP therapy. We talked about his recent admissions to the hospital today. Test results were discussed with the patient and his wife. He is currently. He is encouraged to stay better hydrated.  I answered all their questions today and I will see him back in 6 months. He is advised to keep his appointment with Dr. Erlinda Hong in November as well. I answered all their questions today and the patient and his wife were in agreement.  I spent 25 minutes in total face-to-face time with the patient, more than 50% of which was spent in counseling and coordination of care, reviewing test results, reviewing medication and discussing or reviewing the diagnosis of recurrent stroke, OSA, memory loss, the prognosis and treatment options.

## 2015-04-10 DIAGNOSIS — E785 Hyperlipidemia, unspecified: Secondary | ICD-10-CM | POA: Diagnosis not present

## 2015-04-10 DIAGNOSIS — K219 Gastro-esophageal reflux disease without esophagitis: Secondary | ICD-10-CM | POA: Diagnosis not present

## 2015-04-10 DIAGNOSIS — Z8673 Personal history of transient ischemic attack (TIA), and cerebral infarction without residual deficits: Secondary | ICD-10-CM | POA: Diagnosis not present

## 2015-04-10 DIAGNOSIS — E119 Type 2 diabetes mellitus without complications: Secondary | ICD-10-CM | POA: Diagnosis not present

## 2015-04-10 DIAGNOSIS — F329 Major depressive disorder, single episode, unspecified: Secondary | ICD-10-CM | POA: Diagnosis not present

## 2015-04-10 DIAGNOSIS — I2510 Atherosclerotic heart disease of native coronary artery without angina pectoris: Secondary | ICD-10-CM | POA: Diagnosis not present

## 2015-04-11 DIAGNOSIS — E785 Hyperlipidemia, unspecified: Secondary | ICD-10-CM | POA: Diagnosis not present

## 2015-04-11 DIAGNOSIS — E119 Type 2 diabetes mellitus without complications: Secondary | ICD-10-CM | POA: Diagnosis not present

## 2015-04-11 DIAGNOSIS — K219 Gastro-esophageal reflux disease without esophagitis: Secondary | ICD-10-CM | POA: Diagnosis not present

## 2015-04-11 DIAGNOSIS — I2510 Atherosclerotic heart disease of native coronary artery without angina pectoris: Secondary | ICD-10-CM | POA: Diagnosis not present

## 2015-04-11 DIAGNOSIS — Z8673 Personal history of transient ischemic attack (TIA), and cerebral infarction without residual deficits: Secondary | ICD-10-CM | POA: Diagnosis not present

## 2015-04-11 DIAGNOSIS — F329 Major depressive disorder, single episode, unspecified: Secondary | ICD-10-CM | POA: Diagnosis not present

## 2015-04-12 ENCOUNTER — Encounter: Payer: Self-pay | Admitting: Cardiology

## 2015-04-12 NOTE — Telephone Encounter (Signed)
Sure. Thanks  Rosalin Hawking, MD PhD Stroke Neurology 04/12/2015 6:22 PM

## 2015-04-13 ENCOUNTER — Telehealth: Payer: Self-pay | Admitting: Emergency Medicine

## 2015-04-13 DIAGNOSIS — Z8673 Personal history of transient ischemic attack (TIA), and cerebral infarction without residual deficits: Secondary | ICD-10-CM | POA: Diagnosis not present

## 2015-04-13 DIAGNOSIS — E785 Hyperlipidemia, unspecified: Secondary | ICD-10-CM | POA: Diagnosis not present

## 2015-04-13 DIAGNOSIS — F329 Major depressive disorder, single episode, unspecified: Secondary | ICD-10-CM | POA: Diagnosis not present

## 2015-04-13 DIAGNOSIS — E119 Type 2 diabetes mellitus without complications: Secondary | ICD-10-CM | POA: Diagnosis not present

## 2015-04-13 DIAGNOSIS — I2510 Atherosclerotic heart disease of native coronary artery without angina pectoris: Secondary | ICD-10-CM | POA: Diagnosis not present

## 2015-04-13 DIAGNOSIS — K219 Gastro-esophageal reflux disease without esophagitis: Secondary | ICD-10-CM | POA: Diagnosis not present

## 2015-04-13 NOTE — Telephone Encounter (Signed)
Herbert Deaner, physical therapist, called to inform Dr. [name] that this patient has been discharged from PT today and has met all of his goals. He also noted that the patient has stopped taking Lyrica because he is out of the medication and due to financial limitation he is unable to get another refill. Please advise if necessary.   [phone] Herbert Deaner)  [phone] (M) (Patient contact)

## 2015-04-14 NOTE — Telephone Encounter (Signed)
Called and spoke with pts wife and she is aware of Dr. Caren Griffins recs.   She stated that the lyrica is very expensive and i advised her that she should check into seeing if the patient will qualify for patient assistance with this medication.  She will look up the [place] website to see if he falls into this category to get assistance with this medication.  pts wife will call back if anything further is needed. Will forward to Dr. [name] to make him aware.

## 2015-04-14 NOTE — Telephone Encounter (Signed)
We are happy to refill his Lyrica. We do not have samples of this medication at the present time. Please let him know that. or contact his wife who is in charge of his medications.

## 2015-04-17 ENCOUNTER — Ambulatory Visit: Payer: Medicare Other | Admitting: Neurology

## 2015-04-17 ENCOUNTER — Ambulatory Visit (INDEPENDENT_AMBULATORY_CARE_PROVIDER_SITE_OTHER): Payer: Medicare Other | Admitting: *Deleted

## 2015-04-17 DIAGNOSIS — I639 Cerebral infarction, unspecified: Secondary | ICD-10-CM

## 2015-04-18 ENCOUNTER — Inpatient Hospital Stay (HOSPITAL_COMMUNITY): Admission: RE | Admit: 2015-04-18 | Payer: Medicare Other | Source: Ambulatory Visit

## 2015-04-19 NOTE — Progress Notes (Signed)
Loop recorder 

## 2015-04-20 ENCOUNTER — Telehealth (HOSPITAL_COMMUNITY): Payer: Self-pay

## 2015-04-20 ENCOUNTER — Encounter: Payer: Self-pay | Admitting: Cardiology

## 2015-04-20 NOTE — Telephone Encounter (Signed)
Encounter complete. 

## 2015-04-24 LAB — CUP PACEART REMOTE DEVICE CHECK: Date Time Interrogation Session: 20160829121252

## 2015-04-25 ENCOUNTER — Ambulatory Visit (HOSPITAL_COMMUNITY)
Admission: RE | Admit: 2015-04-25 | Discharge: 2015-04-25 | Disposition: A | Discharge status: Home / Self Care | Payer: Medicare Other | Source: Ambulatory Visit | Attending: Cardiology | Admitting: Cardiology

## 2015-04-25 DIAGNOSIS — R0609 Other forms of dyspnea: Secondary | ICD-10-CM | POA: Insufficient documentation

## 2015-04-25 DIAGNOSIS — I779 Disorder of arteries and arterioles, unspecified: Secondary | ICD-10-CM | POA: Insufficient documentation

## 2015-04-25 DIAGNOSIS — I10 Essential (primary) hypertension: Secondary | ICD-10-CM | POA: Diagnosis not present

## 2015-04-25 DIAGNOSIS — R9439 Abnormal result of other cardiovascular function study: Secondary | ICD-10-CM | POA: Insufficient documentation

## 2015-04-25 DIAGNOSIS — E119 Type 2 diabetes mellitus without complications: Secondary | ICD-10-CM | POA: Diagnosis not present

## 2015-04-25 DIAGNOSIS — I2510 Atherosclerotic heart disease of native coronary artery without angina pectoris: Secondary | ICD-10-CM | POA: Insufficient documentation

## 2015-04-25 DIAGNOSIS — I739 Peripheral vascular disease, unspecified: Secondary | ICD-10-CM | POA: Diagnosis not present

## 2015-04-25 LAB — MYOCARDIAL PERFUSION IMAGING
CHL CUP NUCLEAR SDS: 1
CHL CUP RESTING HR STRESS: 69 {beats}/min
CSEPPHR: 90 {beats}/min
LV dias vol: 73 mL
LV sys vol: 21 mL
SRS: 4
SSS: 5
TID: 1.14

## 2015-04-25 MED ORDER — REGADENOSON 0.4 MG/5ML IV SOLN
0.4000 mg | Freq: Once | INTRAVENOUS | Status: AC
  Administered 2015-04-25: 0.4 mg via INTRAVENOUS

## 2015-04-25 MED ORDER — TECHNETIUM TC 99M SESTAMIBI GENERIC - CARDIOLITE
10.5000 | Freq: Once | INTRAVENOUS | Status: AC | PRN
  Administered 2015-04-25: 11 via INTRAVENOUS

## 2015-04-25 MED ORDER — TECHNETIUM TC 99M SESTAMIBI GENERIC - CARDIOLITE
30.1000 | Freq: Once | INTRAVENOUS | Status: AC | PRN
  Administered 2015-04-25: 30.1 via INTRAVENOUS

## 2015-05-16 ENCOUNTER — Ambulatory Visit (INDEPENDENT_AMBULATORY_CARE_PROVIDER_SITE_OTHER): Payer: Medicare Other | Admitting: *Deleted

## 2015-05-16 DIAGNOSIS — I639 Cerebral infarction, unspecified: Secondary | ICD-10-CM | POA: Diagnosis not present

## 2015-05-16 NOTE — Progress Notes (Signed)
Loop recorder 

## 2015-05-17 ENCOUNTER — Encounter: Payer: Self-pay | Admitting: Internal Medicine

## 2015-05-20 LAB — CUP PACEART REMOTE DEVICE CHECK: Date Time Interrogation Session: 20160920043529

## 2015-05-20 NOTE — Progress Notes (Signed)
[facility] summary report received. Battery status OK. Normal device function. No new symptom episodes, tachy episodes, brady, or pause episodes. No new AF episodes. Monthly summary reports and ROV with SK in 10/2015.

## 2015-05-30 ENCOUNTER — Encounter: Payer: Self-pay | Admitting: Emergency Medicine

## 2015-06-06 ENCOUNTER — Encounter: Payer: Self-pay | Admitting: Internal Medicine

## 2015-06-14 ENCOUNTER — Ambulatory Visit (INDEPENDENT_AMBULATORY_CARE_PROVIDER_SITE_OTHER): Payer: Medicare Other | Admitting: *Deleted

## 2015-06-14 DIAGNOSIS — I639 Cerebral infarction, unspecified: Secondary | ICD-10-CM | POA: Diagnosis not present

## 2015-06-15 NOTE — Progress Notes (Signed)
LOOP RECORDER

## 2015-06-30 ENCOUNTER — Telehealth: Payer: Self-pay | Admitting: Family Medicine

## 2015-06-30 NOTE — Telephone Encounter (Signed)
Spoke with patients wife and she is [facility] to [facility] and get a copy of the patients eye exam [place]ed over to [place].

## 2015-07-04 LAB — CUP PACEART REMOTE DEVICE CHECK: Date Time Interrogation Session: 20161020043525

## 2015-07-04 NOTE — Progress Notes (Signed)
[facility] summary report received. Battery status OK. Normal device function. No new symptom episodes, tachy episodes, brady, or pause episodes. No new AF episodes. Monthly summary reports and ROV with SK in 10/2015.

## 2015-07-10 ENCOUNTER — Other Ambulatory Visit: Payer: Self-pay | Admitting: Emergency Medicine

## 2015-07-10 ENCOUNTER — Ambulatory Visit (INDEPENDENT_AMBULATORY_CARE_PROVIDER_SITE_OTHER): Payer: Medicare Other | Admitting: Neurology

## 2015-07-10 ENCOUNTER — Encounter: Payer: Self-pay | Admitting: Neurology

## 2015-07-10 ENCOUNTER — Encounter (INDEPENDENT_AMBULATORY_CARE_PROVIDER_SITE_OTHER): Payer: Self-pay

## 2015-07-10 VITALS — BP 101/68 | HR 71 | Ht 66.0 in | Wt 222.8 lb

## 2015-07-10 DIAGNOSIS — I10 Essential (primary) hypertension: Secondary | ICD-10-CM | POA: Diagnosis not present

## 2015-07-10 DIAGNOSIS — E785 Hyperlipidemia, unspecified: Secondary | ICD-10-CM

## 2015-07-10 DIAGNOSIS — G40909 Epilepsy, unspecified, not intractable, without status epilepticus: Secondary | ICD-10-CM | POA: Diagnosis not present

## 2015-07-10 DIAGNOSIS — I639 Cerebral infarction, unspecified: Secondary | ICD-10-CM

## 2015-07-10 DIAGNOSIS — I63421 Cerebral infarction due to embolism of right anterior cerebral artery: Secondary | ICD-10-CM | POA: Diagnosis not present

## 2015-07-10 DIAGNOSIS — I25709 Atherosclerosis of coronary artery bypass graft(s), unspecified, with unspecified angina pectoris: Secondary | ICD-10-CM

## 2015-07-10 DIAGNOSIS — I6521 Occlusion and stenosis of right carotid artery: Secondary | ICD-10-CM | POA: Diagnosis not present

## 2015-07-10 DIAGNOSIS — Z0289 Encounter for other administrative examinations: Secondary | ICD-10-CM

## 2015-07-10 DIAGNOSIS — E1159 Type 2 diabetes mellitus with other circulatory complications: Secondary | ICD-10-CM

## 2015-07-10 MED ORDER — LEVETIRACETAM 500 MG PO TABS: 500.0000 mg | ORAL_TABLET | Freq: Two times a day (BID) | ORAL | Status: DC

## 2015-07-10 NOTE — Progress Notes (Signed)
STROKE NEUROLOGY FOLLOW UP NOTE  NAME: [name] DOB: [birth year]  REASON FOR VISIT: stroke follow up HISTORY FROM: wife and chart  Today we had the pleasure of seeing [name] in follow-up at our Neurology Clinic. Pt was accompanied by wife.   History Summary [name] is a 69 yo male with PMH of CAD s/p CABG in 2005, HTN, DM, HLD, OSA on CPAP, seizure disorder on keppra in the past, chronic diastolic CHF on chronic home O2 was admitted on 11/14/13 for staring spell and left leg weakness and falling. MRI showed acute right ACA stroke. MRA showed no large vessel stenosis and carotid Doppler were unremarkable. Stroke workup including TTE and TEE were negative, and he had loop recorder placed. He was put on aspirin for stroke prevention.   Subsequent loop recording did not show afib, and patient was enrolled to RESPECT ESUS study. However, he was taken off the trial in 05/2014 due to PE which required anticoagulation with coumadin. EMG study in 04/2014 showed right S1 radiculopathy with some involvement at L3 or L4 level. He continued to follow up in our clinic for stroke and loop recorder did not show afib. 03/2014 CTA head and neck showed intracranial stenosis with 70% right supraclinoid ICA stenosis.  On 02/11/15, he had episode of staring again, not responding to wife, EMS called, on arrival pt staring resolved. After EMS left, pt had send episode of staring and not responding, wife called EMS back, and pt was sent to [facility]. In ED, his staring seemed resolved but still has speech difficulty. INR 2.07 on admission. 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 His coumadin changed to eliquis and continued on ASA for cardiac prevention. A1C 7.1 and lipid panel showed high TG and LDL not able to be calculated. He was continued on lipitor and also put on Tricor.   In 02/2015, pt had another episode of staring spell and  resolved in 5-10min, he was seen in ER and complain of dizziness and was told to have vertigo. MRI done showed similar findings as in 01/2015, concerning for either blood product from prior infarct or laminar necrosis. He and was told to have vertigo was continued on eliquis.  Interval History During the interval time, the patient has been doing the same. No more staring spells. Continued on eliquis and ASA without side effects. Loop recorder did not show afib or aflutter so far. He is still on chronic home O2. As per wife, pt had seizure disorder in the past, not sure about EEG findings but was put on keppra for a while. Pt still has LLE weakness and currently walk with walker. BP 101/68 in clinic.     REVIEW OF SYSTEMS: Full 14 system review of systems performed and notable only for those listed below and in HPI above, all others are negative:  Constitutional:   Cardiovascular:  Ear/Nose/Throat:   Skin:  Eyes:   Respiratory:   Gastroitestinal:   Genitourinary:  Hematology/Lymphatic:   Endocrine:  Musculoskeletal:   Allergy/Immunology:   Neurological:   Psychiatric: Agitation, behavior problem Sleep:   The following represents the patient's updated allergies and side effects list: Allergies  Allergen Reactions  . Cephalexin     Unknown  . Doxycycline     Unknown  . Keflex [Cephalexin]   . Lac Bovis Other (See Comments)    lactose intolerant  . Pentazocine Lactate  He passed out- he had a seizure.  This occurred around 2000  . Talwin [Pentazocine]   . Tape Other (See Comments)    Paper tape only please.  . Zanaflex [Tizanidine Hcl] Other (See Comments)    Lightheaded and dizzy    The neurologically relevant items on the patient's problem list were reviewed on today's visit.  Neurologic Examination  A problem focused neurological exam (12 or more points of the single system neurologic examination, vital signs counts as 1 point, cranial nerves count for 8 points) was  performed.  Blood pressure 101/68, pulse 71, height 5' 6"  (1.676 m), weight 222 lb 12.8 oz (101.061 kg).  General - Well nourished, well developed, in no apparent distress, on O2.  Ophthalmologic - Fundi not visualized due to noncooperation.  Cardiovascular - Regular rate and rhythm.  Mental Status -  Level of arousal and orientation to time, place, and person were intact. Language including expression, naming, repetition, comprehension was assessed and found intact. Fund of Knowledge was assessed and was impaired.  Cranial Nerves II - XII - II - Visual field intact OU. III, IV, VI - Extraocular movements intact. V - Facial sensation intact bilaterally. VII - Facial movement intact bilaterally. VIII - Hearing & vestibular intact bilaterally. X - Palate elevates symmetrically. XI - Chin turning & shoulder shrug intact bilaterally. XII - Tongue protrusion intact.  Motor Strength - The patient's strength was normal in all extremities except LLE 4/5 and pronator drift was absent.  Bulk was normal and fasciculations were absent.   Motor Tone - Muscle tone was assessed at the neck and appendages and was normal.  Reflexes - The patient's reflexes were 1+ in all extremities and he had no pathological reflexes.  Sensory - Light touch, temperature/pinprick were assessed and were normal.    Coordination - The patient had normal movements in the hands with no ataxia or dysmetria.  Tremor was absent.  Gait and Station - walk with walker, slow, small stride, stooped posturing, mild left hemiparetic gait.  Data reviewed: I personally reviewed the images and agree with the radiology interpretations.  MRI and MRA 11/14/13 - Subacute infarction in the right frontal lobe. Swelling, laminar necrosis and low level blood product deposition. No frank hematoma. Older right frontal cortical infarction adjacent to that. Chronic small vessel changes elsewhere throughout the brain. Distal vessel  atherosclerotic changes diffusely. The right anterior cerebral artery is patent but is smaller than the left.  MRI and MRA 02/11/15 Chronic right ACA infarct in the right frontal lobe. There is now a small area of acute infarct in the right frontal cortex over the convexity compatible with extension of infarction. Atrophy and chronic microvascular ischemic changes. Chronic infarct left pons has developed since 2015. Severe stenosis left posterior cerebral artery is unchanged from the prior study. Right anterior cerebral artery is widely patent.  MRI 03/07/15 No significant change from 02/11/2015. Chronic hemorrhagic infarct right frontal lobe with area of restricted diffusion compatible with acute or subacute infarct along the superior anterior margin of the chronic infarct. Atrophy and chronic microvascular ischemia.  CTA head and neck 04/01/2014 -  1. Right ACA occlusion in the distal A2 segment just beyond the frontopolar artery. Expected evolution of the right ACA infarct which occurred in March. 2. Severe right ICA supraclinoid segment atherosclerotic stenosis, numerically estimated at 70%. 3. Additional bilateral ICA siphon calcified plaque, and bilateral cervical carotid calcified plaque, without additional hemodynamically significant stenosis. 4. Moderate stenosis at the left vertebral  artery origin. Mild vertebral plaque otherwise.  CUS 02/13/15 -  - Technically difficult due to respiratory interference and high  bifurcations - Bilateral - 1% to 39% ICA stenosis. Vertebral artery flow is  antegrade.  TTE 12/02/14  - Left ventricle: The cavity size was normal. Wall thickness was normal. Systolic function was normal. The estimated ejection fraction was in the range of 55% to 60%. Doppler parameters are consistent with abnormal left ventricular relaxation (grade 1 diastolic dysfunction). - Mitral valve: Calcified annulus. Mildly thickened leaflets . - Pulmonary  arteries: PA peak pressure: 34 mm Hg (S).  Nuclear stress test 04/25/15  The left ventricular ejection fraction is hyperdynamic (>65%).  Nuclear stress EF: 71%.  There was no ST segment deviation noted during stress.  This is a low risk study.  Loop recorder - no afib so far  EMG/NCS 05/10/14 - Nerve conduction studies done on both lower extremities were unremarkable, without evidence of a peripheral neuropathy. EMG evaluation of the right lower extremity shows findings that are most consistent with a low-grade acute S1 radiculopathy, with some involvement as well at the L3 or L4 level. EMG evaluation of the left lower extremity does not show clear evidence of a lumbosacral radiculopathy.  Component     Latest Ref Rng 02/12/2015  Cholesterol     0 - 200 mg/dL 195  Triglycerides     0 - 149 mg/dL 431 (H)  HDL Cholesterol     >39 mg/dL 33 (L)  Total CHOL/HDL Ratio     0.0 - 5.0 ratio units 5.9  VLDL     0 - 40 mg/dL UNABLE TO CALCULATE IF TRIGLYCERIDE OVER 400 mg/dL  LDL (calc)     0 - 99 mg/dL UNABLE TO CALCULATE IF TRIGLYCERIDE OVER 400 mg/dL  Cholesterol, Total     100 - 199 mg/dL   VLDL Cholesterol Cal     5 - 40 mg/dL   Hemoglobin A1C     4.8 - 5.6 % 7.1 (H)  Mean Plasma Glucose      157   Component     Latest Ref Rng 03/31/2015  Cholesterol     0 - 200 mg/dL   Triglycerides     0 - 149 mg/dL 559 (HH)  HDL Cholesterol     >39 mg/dL 34 (L)  Total CHOL/HDL Ratio     0.0 - 5.0 ratio units 5.5 (H)  VLDL     0 - 40 mg/dL   LDL (calc)     0 - 99 mg/dL Comment  Cholesterol, Total     100 - 199 mg/dL 187  VLDL Cholesterol Cal     5 - 40 mg/dL Comment  Hemoglobin A1C     4.8 - 5.6 %   Mean Plasma Glucose          Assessment: As you may recall, he is a 69 y.o. Caucasian male with PMH of CAD s/p CABG in 2005, HTN, DM, HLD, OSA on CPAP, seizure disorder on keppra in the past, chronic diastolic CHF on chronic home O2 was admitted on 11/14/13 for acute right ACA stroke.  MRA showed no large vessel stenosis and carotid Doppler were unremarkable. TTE and TEE were negative, and loop recorder placed. Subsequent he was enrolled to RESPECT ESUS study. However, he was taken off the trial in 05/2014 due to PE which required anticoagulation with coumadin. 03/2014 CTA head and neck showed intracranial stenosis with 70% right supraclinoid ICA stenosis. On 02/11/15, he was  admitted again for 2 episodes of staring, not responding to wife, INR 2.07 on admission. 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 His coumadin changed to eliquis and continued on ASA for cardiac prevention. In 02/2015, pt had another episode of staring spell lasting 5-75mn. MRI done showed similar findings as in 01/2015, concerning for either blood product from prior infarct or laminar necrosis. He was continued on eliquis.   From the description of staring spells, and his large right ACA infarct in the past, the episodes in June and July are concerning for seizure episodes, instead of stroke. Especially, the MRIs repeat in June and July do not support new stroke but more laminar necrosis. Will continue eliquis, but also will repeat MRI to confirm the laminar necrosis, and do EEG for evaluation, as well as resume keppra for seizure control.   Plan:  - continue eliquis and ASA for stroke prevention - continue lipitor and tricor, if LDL and TG still difficulty in control, recommend PCSK9 inhibitors. - repeat MRI to evaluate the brain lesion - EEG for evaluation of seizure - start keppra for seizure control. Will inform his [facility] Manorto continue prescribe keppra. - Follow up with your primary care physician for stroke risk factor modification. Recommend maintain blood pressure goal <130/80, diabetes with hemoglobin A1c goal below 6.5% and lipids with LDL cholesterol goal below 70 mg/dL.  - check BP and glucose at home - follow up in 2 months.     A total of 45 minutes was spent face-to-face with this patient. This is a medically very complicated case and over half this time was spent on counseling patient on the seizure and stroke diagnosis and different diagnostic and therapeutic options available.    Orders Placed This Encounter  Procedures  . MR Brain Wo Contrast    Standing Status: Future     Number of Occurrences:      Standing Expiration Date: 09/10/2016    Order Specific Question:  Reason for Exam (SYMPTOM  OR DIAGNOSIS REQUIRED)    Answer:  follow up with stroke in the past    Order Specific Question:  Preferred imaging location?    Answer:  Internal    Order Specific Question:  Does the patient have a pacemaker or implanted devices?    Answer:  No    Order Specific Question:  What is the patient's sedation requirement?    Answer:  No Sedation  . EEG adult    Standing Status: Future     Number of Occurrences:      Standing Expiration Date: 07/09/2016    Meds ordered this encounter  Medications  . pregabalin (LYRICA) 50 MG capsule    Sig: Take 50 mg by mouth 3 (three) times daily.  .Marland KitchenlevETIRAcetam (KEPPRA) 500 MG tablet    Sig: Take 1 tablet (500 mg total) by mouth 2 (two) times daily.    Dispense:  60 tablet    Refill:  2    Patient Instructions  - continue eliquis and ASA for stroke prevention - will repeat MRI to evaluate the brain lesion - will do EEG for evaluation of seizure - will recommend keppra for seizure control. Will talk to your [facility] - Follow up with your primary care physician for stroke risk factor modification. Recommend maintain blood pressure goal <130/80, diabetes with hemoglobin A1c goal below 6.5% and lipids with LDL cholesterol goal below 70 mg/dL.  -  check BP and glucose at home - follow up in 2 months.    Rosalin Hawking, MD PhD [facility] [address] [place], [place] [address] [phone]

## 2015-07-10 NOTE — Patient Instructions (Signed)
-   continue eliquis and ASA for stroke prevention - will repeat MRI to evaluate the brain lesion - will do EEG for evaluation of seizure - will recommend keppra for seizure control. Will talk to your [place] doctor - Follow up with your primary care physician for stroke risk factor modification. Recommend maintain blood pressure goal <130/80, diabetes with hemoglobin A1c goal below 6.5% and lipids with LDL cholesterol goal below 70 mg/dL.  - check BP and glucose at home - follow up in 2 months.

## 2015-07-12 ENCOUNTER — Telehealth: Payer: Self-pay | Admitting: Internal Medicine

## 2015-07-12 DIAGNOSIS — E1159 Type 2 diabetes mellitus with other circulatory complications: Secondary | ICD-10-CM | POA: Insufficient documentation

## 2015-07-12 DIAGNOSIS — I6529 Occlusion and stenosis of unspecified carotid artery: Secondary | ICD-10-CM | POA: Insufficient documentation

## 2015-07-12 DIAGNOSIS — I10 Essential (primary) hypertension: Secondary | ICD-10-CM | POA: Insufficient documentation

## 2015-07-12 DIAGNOSIS — E785 Hyperlipidemia, unspecified: Secondary | ICD-10-CM | POA: Insufficient documentation

## 2015-07-12 DIAGNOSIS — I25709 Atherosclerosis of coronary artery bypass graft(s), unspecified, with unspecified angina pectoris: Secondary | ICD-10-CM | POA: Insufficient documentation

## 2015-07-12 NOTE — Telephone Encounter (Signed)
New Message    Pt's wife calling stating that the pt is going to have an MRI and they need to know some information about his Loop recorder. When it was put in? What kind it is? Where is it located? Please call back and advise.

## 2015-07-14 ENCOUNTER — Ambulatory Visit (INDEPENDENT_AMBULATORY_CARE_PROVIDER_SITE_OTHER): Payer: Medicare Other | Admitting: *Deleted

## 2015-07-14 DIAGNOSIS — I639 Cerebral infarction, unspecified: Secondary | ICD-10-CM | POA: Diagnosis not present

## 2015-07-14 NOTE — Telephone Encounter (Signed)
Information regarding patient's ILR was given to wife in ofc.

## 2015-07-17 NOTE — Progress Notes (Signed)
LOOP RECORDER

## 2015-07-26 ENCOUNTER — Telehealth: Payer: Self-pay | Admitting: Neurology

## 2015-07-26 DIAGNOSIS — G40909 Epilepsy, unspecified, not intractable, without status epilepticus: Secondary | ICD-10-CM

## 2015-07-26 NOTE — Telephone Encounter (Signed)
Discussed with patient wife over the phone. Patient started Keppra about 2 weeks ago and developed depression, agitation, yelling, hostility over wife since then. He had appointment with Dr. Rex Kras in [place] and concerning for side effect from [place]. I agree with that. I started Keppra because patient had been on Keppra in the past and seems to tolerate with the medication, however, the current behavior changes seem to be associated with Keppra side effect.   I told wife to his continue Keppra today, and will wait until patient back to his baseline, and then consider alternative seizure medications such as Vimpat or Lamictal.  Wife expressed appreciation and understanding.  Rosalin Hawking, MD PhD Stroke Neurology 07/26/2015 5:34 PM

## 2015-07-26 NOTE — Telephone Encounter (Signed)
Rn call patients wife [name] who is on the [facility] list about stopping the Keppra. [name] stated that [facility] psych md recommended stopping the keppra. But they wanted to consult with Dr. Erlinda Hong. Pts wife stated he is depression medicine and dont want to take the keppra. Pt is agitated,dizzy today, and hostile. A message will be sent to Dr. Erlinda Hong.

## 2015-07-26 NOTE — Telephone Encounter (Signed)
Pt's wife [name] called she took pt to psychiatrist (Dr Readling with the [place]) yesterday and he wanted Dr u to stop levETIRAcetam (KEPPRA) 500 MG tablet . Pt has depression which has increased and hostility and agitation, some dizziness today. Please call and advise

## 2015-08-01 NOTE — Telephone Encounter (Signed)
Wife called back to advise, Psychiatrist recommends Lamictal of the 2 medications Dr. Erlinda Hong suggested.

## 2015-08-02 MED ORDER — LAMOTRIGINE 100 MG PO TABS: 100.0000 mg | ORAL_TABLET | Freq: Two times a day (BID) | ORAL | Status: DC

## 2015-08-02 MED ORDER — LAMOTRIGINE 25 MG PO TABS: ORAL_TABLET | ORAL | Status: DC

## 2015-08-02 NOTE — Addendum Note (Signed)
Addended by: Rosalin Hawking on: 08/02/2015 04:04 PM   Modules accepted: Orders

## 2015-08-02 NOTE — Telephone Encounter (Addendum)
Discussed with wife over the phone. Will prescribe Lamictal for his seizure like events. Told her that this medication need slow titration and she has to follow up with instruction for administration. If found to have rash or other side effects, please stop the medication and let us know. Wife expressed understanding and appreciation.  Rosalin Hawking, MD PhD Stroke Neurology 08/02/2015 4:04 PM  Meds ordered this encounter  Medications  . lamoTRIgine (LAMICTAL) 25 MG tablet    Sig: One tab Qhs for 2 weeks, then 67m bid for 2 weeks, then 531mbid for 2 weeks, then 7522mid for 2 weeks.    Dispense:  200 tablet    Refill:  0  . lamoTRIgine (LAMICTAL) 100 MG tablet    Sig: Take 1 tablet (100 mg total) by mouth 2 (two) times daily.    Dispense:  60 tablet    Refill:  3

## 2015-08-02 NOTE — Telephone Encounter (Signed)
If wife or patient calls back the message about lamictal was sent to him. He is aware and will call back.

## 2015-08-04 ENCOUNTER — Ambulatory Visit
Admission: RE | Admit: 2015-08-04 | Discharge: 2015-08-04 | Disposition: A | Discharge status: Home / Self Care | Payer: Medicare Other | Source: Ambulatory Visit | Attending: Neurology | Admitting: Neurology

## 2015-08-04 DIAGNOSIS — I63421 Cerebral infarction due to embolism of right anterior cerebral artery: Secondary | ICD-10-CM

## 2015-08-10 ENCOUNTER — Encounter: Payer: Self-pay | Admitting: Cardiology

## 2015-08-14 LAB — CUP PACEART REMOTE DEVICE CHECK: MDC IDC SESS DTM: 20161119050957

## 2015-08-15 ENCOUNTER — Ambulatory Visit (INDEPENDENT_AMBULATORY_CARE_PROVIDER_SITE_OTHER): Payer: Medicare Other | Admitting: *Deleted

## 2015-08-15 DIAGNOSIS — I639 Cerebral infarction, unspecified: Secondary | ICD-10-CM | POA: Diagnosis not present

## 2015-08-15 NOTE — Progress Notes (Signed)
Carelink Summary Report / Loop Recorder 

## 2015-08-17 ENCOUNTER — Encounter: Payer: Self-pay | Admitting: Cardiology

## 2015-08-22 ENCOUNTER — Ambulatory Visit (INDEPENDENT_AMBULATORY_CARE_PROVIDER_SITE_OTHER): Payer: Medicare Other | Admitting: Diagnostic Neuroimaging

## 2015-08-22 DIAGNOSIS — G40909 Epilepsy, unspecified, not intractable, without status epilepticus: Secondary | ICD-10-CM | POA: Diagnosis not present

## 2015-08-23 NOTE — Procedures (Signed)
   [facility]  EEG (ELECTROENCEPHALOGRAM) REPORT   STUDY DATE: 08/22/15 PATIENT NAME: [name] DOB: 11/17/1945 MRN: [id number]  ORDERING CLINICIAN: Rosalin Hawking, MD PhD   TECHNOLOGIST: Laretta Alstrom  TECHNIQUE: Electroencephalogram was recorded utilizing standard 10-20 system of lead placement and reformatted into average and bipolar montages.  RECORDING TIME: 30 minutes  ACTIVATION: photic stimulation  CLINICAL INFORMATION: 69 year old male with staring spells.  FINDINGS: Background rhythms of 8-9 hertz and 15-20 microvolts. No focal, lateralizing, epileptiform activity or seizures are seen. Patient recorded in the awake and drowsy state.   IMPRESSION:  Normal EEG in the awake state.     INTERPRETING PHYSICIAN:  Penni Bombard, MD Certified in Neurology, Neurophysiology and Neuroimaging  [facility] [address] [place], [place] [address] [phone]

## 2015-08-24 ENCOUNTER — Encounter: Payer: Self-pay | Admitting: Cardiology

## 2015-09-01 ENCOUNTER — Telehealth: Payer: Self-pay

## 2015-09-01 NOTE — Telephone Encounter (Signed)
Rn talk to [name] that her husbands EEG was normal. [name] is on the DPR list for her husband. [name] verbalized understanding.

## 2015-09-01 NOTE — Telephone Encounter (Signed)
-----   Message from Rosalin Hawking, MD sent at 08/31/2015  6:32 PM EST ----- Could you please let the patient know that the EEG test done recently in our office was normal EEG. Please continue current treatment. Thanks.  Rosalin Hawking, MD PhD Stroke Neurology 08/31/2015 6:32 PM

## 2015-09-05 NOTE — Progress Notes (Signed)
Patient wants to know about the MRI results per his wife.

## 2015-09-06 NOTE — Progress Notes (Signed)
Discussed with wife over the phone and delivered the MRI brain result to her as well as answered her questions to her satisfaction. Pt is on lamictal now 50m bid without side effects. Wife said that pt also seems nicer than before. Will continue the titration process. Wife expressed understanding and appreciation.  JRosalin Hawking MD PhD Stroke Neurology 09/06/2015 12:39 PM

## 2015-09-07 DIAGNOSIS — R0602 Shortness of breath: Secondary | ICD-10-CM | POA: Diagnosis not present

## 2015-09-13 ENCOUNTER — Ambulatory Visit (INDEPENDENT_AMBULATORY_CARE_PROVIDER_SITE_OTHER): Payer: Medicare Other | Admitting: *Deleted

## 2015-09-13 DIAGNOSIS — I639 Cerebral infarction, unspecified: Secondary | ICD-10-CM | POA: Diagnosis not present

## 2015-09-13 NOTE — Progress Notes (Signed)
Carelink Summary Report / Loop Recorder 

## 2015-09-19 ENCOUNTER — Ambulatory Visit (INDEPENDENT_AMBULATORY_CARE_PROVIDER_SITE_OTHER): Payer: Medicare Other | Admitting: Neurology

## 2015-09-19 ENCOUNTER — Encounter: Payer: Self-pay | Admitting: Neurology

## 2015-09-19 VITALS — BP 127/74 | HR 70 | Ht 66.0 in | Wt 227.0 lb

## 2015-09-19 DIAGNOSIS — E785 Hyperlipidemia, unspecified: Secondary | ICD-10-CM

## 2015-09-19 DIAGNOSIS — G40909 Epilepsy, unspecified, not intractable, without status epilepticus: Secondary | ICD-10-CM

## 2015-09-19 DIAGNOSIS — E1159 Type 2 diabetes mellitus with other circulatory complications: Secondary | ICD-10-CM

## 2015-09-19 DIAGNOSIS — I10 Essential (primary) hypertension: Secondary | ICD-10-CM | POA: Diagnosis not present

## 2015-09-19 DIAGNOSIS — I63421 Cerebral infarction due to embolism of right anterior cerebral artery: Secondary | ICD-10-CM | POA: Insufficient documentation

## 2015-09-19 DIAGNOSIS — I25709 Atherosclerosis of coronary artery bypass graft(s), unspecified, with unspecified angina pectoris: Secondary | ICD-10-CM | POA: Diagnosis not present

## 2015-09-19 DIAGNOSIS — I6930 Unspecified sequelae of cerebral infarction: Secondary | ICD-10-CM

## 2015-09-19 DIAGNOSIS — I639 Cerebral infarction, unspecified: Secondary | ICD-10-CM

## 2015-09-19 NOTE — Progress Notes (Signed)
STROKE NEUROLOGY FOLLOW UP NOTE  NAME: [name] DOB: 06/22/1946  REASON FOR VISIT: stroke follow up HISTORY FROM: wife and chart  Today we had the pleasure of seeing [name] in follow-up at our Neurology Clinic. Pt was accompanied by wife.   History Summary [name] is a 70 yo male with PMH of CAD s/p CABG in 2005, HTN, DM, HLD, OSA on CPAP, seizure disorder on keppra in the past, chronic diastolic CHF on chronic home O2 was admitted on 11/14/13 for staring spell and left leg weakness and falling. MRI showed acute right ACA stroke. MRA showed no large vessel stenosis and carotid Doppler were unremarkable. Stroke workup including TTE and TEE were negative, and he had loop recorder placed. He was put on aspirin for stroke prevention.   Subsequent loop recording did not show afib, and patient was enrolled to RESPECT ESUS study. However, he was taken off the trial in 05/2014 due to PE which required anticoagulation with coumadin. EMG study in 04/2014 showed right S1 radiculopathy with some involvement at L3 or L4 level. He continued to follow up in our clinic for stroke and loop recorder did not show afib. 03/2014 CTA head and neck showed intracranial stenosis with 70% right supraclinoid ICA stenosis.  On 02/11/15, he had episode of staring again, not responding to wife, EMS called, on arrival pt staring resolved. After EMS left, pt had send episode of staring and not responding, wife called EMS back, and pt was sent to [facility]. In ED, his staring seemed resolved but still has speech difficulty. INR 2.07 on admission. 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 His coumadin changed to eliquis and continued on ASA for cardiac prevention. A1C 7.1 and lipid panel showed high TG and LDL not able to be calculated. He was continued on lipitor and also put on Tricor.   In 02/2015, pt had another episode of staring spell and  resolved in 5-10min, he was seen in ER and complain of dizziness and was told to have vertigo. MRI done showed similar findings as in 01/2015, concerning for either blood product from prior infarct or laminar necrosis. He and was told to have vertigo was continued on eliquis.  Follow up 07/10/15 - the patient has been doing the same. No more staring spells. Continued on eliquis and ASA without side effects. Loop recorder did not show afib or aflutter so far. He is still on chronic home O2. As per wife, pt had seizure disorder in the past, not sure about EEG findings but was put on keppra for a while. Pt still has LLE weakness and currently walk with walker. BP 101/68 in clinic.    Interval History During the interval time, he has been doing well from stroke standpoint. No stroke recurrence or seizure like activity. Had EEG done showed normal EEG. Pt not able to tolerate Keppra which was then discontinued. Switched to lamictal with slow titration. Currently on 53m bid. Glucose not in good control at home but BP is under control. Today BP in clinic 127/74. As per wife, pt has insomnia with frequent urination at hight, intermittent urinary incontinence. Pt has appointment with urology this week.   REVIEW OF SYSTEMS: Full 14 system review of systems performed and notable only for those listed below and in HPI above, all others are negative:  Constitutional:  chills Cardiovascular:  Ear/Nose/Throat:   Skin:  Eyes:  Respiratory:   Gastroitestinal:   Genitourinary: incontinence of bladder Hematology/Lymphatic:   Endocrine: excessive eating Musculoskeletal:   Allergy/Immunology:   Neurological:   Psychiatric: behavior problem Sleep:   The following represents the patient's updated allergies and side effects list: Allergies  Allergen Reactions  . Cephalexin     Unknown  . Doxycycline     Unknown  . Keflex [Cephalexin]   . Lac Bovis Other (See Comments)    lactose intolerant  . Pentazocine  Lactate     He passed out- he had a seizure.  This occurred around 2000  . Talwin [Pentazocine] Other (See Comments)    hallucinations  . Tape Other (See Comments)    Paper tape only please.  . Zanaflex [Tizanidine Hcl] Other (See Comments)    Lightheaded and dizzy    The neurologically relevant items on the patient's problem list were reviewed on today's visit.  Neurologic Examination  A problem focused neurological exam (12 or more points of the single system neurologic examination, vital signs counts as 1 point, cranial nerves count for 8 points) was performed.  Blood pressure 127/74, pulse 70, height 5' 6"  (1.676 m), weight 227 lb (102.967 kg).  General - Well nourished, well developed, in no apparent distress, on nasal O2.  Ophthalmologic - Fundi not visualized due to noncooperation.  Cardiovascular - Regular rate and rhythm.  Mental Status -  Level of arousal and orientation to time, place, and person were intact. Language including expression, naming, repetition, comprehension was assessed and found intact. Fund of Knowledge was assessed and was impaired.  Cranial Nerves II - XII - II - Visual field intact OU. III, IV, VI - Extraocular movements intact. V - Facial sensation intact bilaterally. VII - Facial movement intact bilaterally. VIII - Hearing & vestibular intact bilaterally. X - Palate elevates symmetrically. XI - Chin turning & shoulder shrug intact bilaterally. XII - Tongue protrusion intact.  Motor Strength - The patient's strength was normal in all extremities except LLE 4/5 and pronator drift was absent.  Bulk was normal and fasciculations were absent.   Motor Tone - Muscle tone was assessed at the neck and appendages and was normal.  Reflexes - The patient's reflexes were 1+ in all extremities and he had no pathological reflexes.  Sensory - Light touch, temperature/pinprick were assessed and were normal.    Coordination - The patient had normal  movements in the hands with no ataxia or dysmetria.  Tremor was absent.  Gait and Station - walk with walker, slow, small stride, stooped posturing, subtle left hemiparetic gait.  Data reviewed: I personally reviewed the images and agree with the radiology interpretations.  MRI and MRA 11/14/13 - Subacute infarction in the right frontal lobe. Swelling, laminar necrosis and low level blood product deposition. No frank hematoma. Older right frontal cortical infarction adjacent to that. Chronic small vessel changes elsewhere throughout the brain. Distal vessel atherosclerotic changes diffusely. The right anterior cerebral artery is patent but is smaller than the left.  MRI and MRA 02/11/15 Chronic right ACA infarct in the right frontal lobe. There is now a small area of acute infarct in the right frontal cortex over the convexity compatible with extension of infarction. Atrophy and chronic microvascular ischemic changes. Chronic infarct left pons has developed since 2015. Severe stenosis left posterior cerebral artery is unchanged from the prior study. Right anterior cerebral artery is widely patent.  MRI 03/07/15 No significant change from 02/11/2015. Chronic hemorrhagic infarct right frontal lobe with area of  restricted diffusion compatible with acute or subacute infarct along the superior anterior margin of the chronic infarct. Atrophy and chronic microvascular ischemia.  MRI 08/04/15 Abnormal MRI scan of the brain showing remote age large left frontal infarcts with encephalomalacia, gliosis, laminar necrosis as well as small left pontine remote age lacunar infarct. Persistent diffusion-positive signal in the right frontal cortex likely represents laminar necrosis as compared with previous MRI scan dated 02/11/2015 no significant changes are noted  CTA head and neck 04/01/2014 -  1. Right ACA occlusion in the distal A2 segment just beyond the frontopolar artery. Expected evolution of the  right ACA infarct which occurred in March. 2. Severe right ICA supraclinoid segment atherosclerotic stenosis, numerically estimated at 70%. 3. Additional bilateral ICA siphon calcified plaque, and bilateral cervical carotid calcified plaque, without additional hemodynamically significant stenosis. 4. Moderate stenosis at the left vertebral artery origin. Mild vertebral plaque otherwise.  CUS 02/13/15 -  - Technically difficult due to respiratory interference and high  bifurcations - Bilateral - 1% to 39% ICA stenosis. Vertebral artery flow is  antegrade.  TTE 12/02/14  - Left ventricle: The cavity size was normal. Wall thickness was normal. Systolic function was normal. The estimated ejection fraction was in the range of 55% to 60%. Doppler parameters are consistent with abnormal left ventricular relaxation (grade 1 diastolic dysfunction). - Mitral valve: Calcified annulus. Mildly thickened leaflets . - Pulmonary arteries: PA peak pressure: 34 mm Hg (S).  Nuclear stress test 04/25/15  The left ventricular ejection fraction is hyperdynamic (>65%).  Nuclear stress EF: 71%.  There was no ST segment deviation noted during stress.  This is a low risk study.  Loop recorder - no afib so far  EMG/NCS 05/10/14 - Nerve conduction studies done on both lower extremities were unremarkable, without evidence of a peripheral neuropathy. EMG evaluation of the right lower extremity shows findings that are most consistent with a low-grade acute S1 radiculopathy, with some involvement as well at the L3 or L4 level. EMG evaluation of the left lower extremity does not show clear evidence of a lumbosacral radiculopathy.  EEG 08/23/15 - Normal EEG in the awake state.   Component     Latest Ref Rng 02/12/2015  Cholesterol     0 - 200 mg/dL 195  Triglycerides     0 - 149 mg/dL 431 (H)  HDL Cholesterol     >39 mg/dL 33 (L)  Total CHOL/HDL Ratio     0.0 - 5.0 ratio units 5.9  VLDL     0 -  40 mg/dL UNABLE TO CALCULATE IF TRIGLYCERIDE OVER 400 mg/dL  LDL (calc)     0 - 99 mg/dL UNABLE TO CALCULATE IF TRIGLYCERIDE OVER 400 mg/dL  Cholesterol, Total     100 - 199 mg/dL   VLDL Cholesterol Cal     5 - 40 mg/dL   Hemoglobin A1C     4.8 - 5.6 % 7.1 (H)  Mean Plasma Glucose      157   Component     Latest Ref Rng 03/31/2015  Cholesterol     0 - 200 mg/dL   Triglycerides     0 - 149 mg/dL 559 (HH)  HDL Cholesterol     >39 mg/dL 34 (L)  Total CHOL/HDL Ratio     0.0 - 5.0 ratio units 5.5 (H)  VLDL     0 - 40 mg/dL   LDL (calc)     0 - 99 mg/dL Comment  Cholesterol, Total  100 - 199 mg/dL 187  VLDL Cholesterol Cal     5 - 40 mg/dL Comment  Hemoglobin A1C     4.8 - 5.6 %   Mean Plasma Glucose          Assessment: As you may recall, he is a 70 y.o. Caucasian male with PMH of CAD s/p CABG in 2005, HTN, DM, HLD, OSA on CPAP, seizure disorder on keppra in the past, chronic diastolic CHF on chronic home O2 was admitted on 11/14/13 for acute right ACA stroke. MRA showed no large vessel stenosis and carotid Doppler were unremarkable. TTE and TEE were negative, and loop recorder placed. Subsequent he was enrolled to RESPECT ESUS study. However, he was taken off the trial in 05/2014 due to PE which required anticoagulation with coumadin. 03/2014 CTA head and neck showed intracranial stenosis with 70% right supraclinoid ICA stenosis. On 02/11/15, he was admitted again for 2 episodes of staring, not responding to wife, INR 2.07 on admission. 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 His coumadin changed to eliquis and continued on ASA for cardiac prevention. In 02/2015, pt had another episode of staring spell lasting 5-16mn. MRI done showed similar findings as in 01/2015, concerning for either blood product from prior infarct or laminar necrosis. He was continued on eliquis.   From the description of staring spells,  and his large right ACA infarct in the past, the episodes in June and July are concerning for seizure episodes, instead of stroke. Especially, the MRIs repeat in June and July do not support new stroke but more laminar necrosis which was confirmed by MRI in 07/2015. EEG normal study, Will continue eliquis and titration lamictal to 1025mbid. Repeat MRI to confirm the laminar necrosis, and EEG was normal study. Pt not tolerating keppra due to side effects, and was put on lamictal for seizure control.   Plan:  - continue eliquis and ASA for stroke prevention - continue lipitor and tricor for HLD - continue lamictal according to the titration schedule to [id number].  - continue follow with Dr. JorMartinique cardiology - Follow up with your primary care physician for stroke risk factor modification. Recommend maintain blood pressure goal <130/80, diabetes with hemoglobin A1c goal below 6.5% and lipids with LDL cholesterol goal below 70 mg/dL.  - check BP and glucose at home - will order outpt PT/OT - follow up in 4 months.   A total of 25 minutes was spent face-to-face with this patient. This is a medically very complicated case and over half this time was spent on counseling patient on the seizure and stroke diagnosis and different diagnostic and therapeutic options available.    Orders Placed This Encounter  Procedures  . Ambulatory referral to Physical Therapy    Referral Priority:  Routine    Referral Type:  Physical Medicine    Referral Reason:  Specialty Services Required    Requested Specialty:  Physical Therapy    Number of Visits Requested:  1  . Ambulatory referral to Occupational Therapy    Referral Priority:  Routine    Referral Type:  Occupational Therapy    Referral Reason:  Specialty Services Required    Requested Specialty:  Occupational Therapy    Number of Visits Requested:  1    No orders of the defined types were placed in this encounter.    Patient Instructions  -  continue eliquis and ASA for  stroke prevention - continue lipitor and tricor, if LDL and TG still difficulty in control, recommend PCSK9 inhibitors. - continue lamictal according to the titration schedule. - continue follow with Dr. Martinique in cardiology - Follow up with your primary care physician for stroke risk factor modification. Recommend maintain blood pressure goal <130/80, diabetes with hemoglobin A1c goal below 6.5% and lipids with LDL cholesterol goal below 70 mg/dL.  - check BP and glucose at home - will order outpt PT/OT - follow up in 4 months.     Rosalin Hawking, MD PhD [facility] [address] [place], [place] [address] [phone]

## 2015-09-19 NOTE — Patient Instructions (Addendum)
-   continue eliquis and ASA for stroke prevention - continue lipitor and tricor, if LDL and TG still difficulty in control, recommend PCSK9 inhibitors. - continue lamictal according to the titration schedule. - continue follow with Dr. Martinique in cardiology - Follow up with your primary care physician for stroke risk factor modification. Recommend maintain blood pressure goal <130/80, diabetes with hemoglobin A1c goal below 6.5% and lipids with LDL cholesterol goal below 70 mg/dL.  - check BP and glucose at home - will order outpt PT/OT - follow up in 4 months.

## 2015-09-21 DIAGNOSIS — N200 Calculus of kidney: Secondary | ICD-10-CM | POA: Diagnosis not present

## 2015-09-21 DIAGNOSIS — R351 Nocturia: Secondary | ICD-10-CM | POA: Diagnosis not present

## 2015-09-21 DIAGNOSIS — N3941 Urge incontinence: Secondary | ICD-10-CM | POA: Diagnosis not present

## 2015-09-21 DIAGNOSIS — Z0000 Encounter for general adult medical examination without abnormal findings: Secondary | ICD-10-CM | POA: Diagnosis not present

## 2015-09-26 ENCOUNTER — Ambulatory Visit: Payer: Medicare Other | Admitting: Rehabilitation

## 2015-09-26 ENCOUNTER — Ambulatory Visit: Payer: Medicare Other | Attending: Neurology | Admitting: Occupational Therapy

## 2015-09-26 ENCOUNTER — Encounter: Payer: Self-pay | Admitting: Rehabilitation

## 2015-09-26 ENCOUNTER — Encounter: Payer: Self-pay | Admitting: Occupational Therapy

## 2015-09-26 DIAGNOSIS — R6889 Other general symptoms and signs: Secondary | ICD-10-CM

## 2015-09-26 DIAGNOSIS — R2689 Other abnormalities of gait and mobility: Secondary | ICD-10-CM

## 2015-09-26 DIAGNOSIS — R2681 Unsteadiness on feet: Secondary | ICD-10-CM | POA: Insufficient documentation

## 2015-09-26 DIAGNOSIS — R531 Weakness: Secondary | ICD-10-CM | POA: Insufficient documentation

## 2015-09-26 DIAGNOSIS — Z7409 Other reduced mobility: Secondary | ICD-10-CM | POA: Insufficient documentation

## 2015-09-26 NOTE — Patient Instructions (Signed)
Pt instructed on use of long handled shoe horn, reacher and sock donner to increase independence with ADL's

## 2015-09-26 NOTE — Therapy (Signed)
[facility] [address] [place], [place], [address] Phone: [phone]   Fax:  [phone]  Occupational Therapy Evaluation  Patient Details  Name: [name] MRN: [id number] Date of Birth: 08/17/1946 Referring Provider: Dr. Rosalin Hawking  Encounter Date: 09/26/2015      OT End of Session - 09/26/15 1317    Visit Number 1   Number of Visits 1   Authorization Type medicare    OT Start Time 1102   OT Stop Time 1143   OT Time Calculation (min) 41 min   Activity Tolerance Patient tolerated treatment well      Past Medical History  Diagnosis Date  . Coronary artery disease     a. Low level exercise Lex MV 3/14: low risk, EF 62%, inf defect 2/2 diaph attenuation vs artifiact, small area of scar possible, no ischemia  . Hyperlipidemia   . Hypertension   . History of seizure disorder   . History of atrial flutter   . Depression   . Back pain     persistent  . Hearing loss   . Diverticulitis   . H/O alcohol abuse   . Diabetes mellitus     controled by diet  . GERD (gastroesophageal reflux disease)   . Seizures ([place])   . Headache(784.0)   . Arthritis     of spine  . Sleep apnea     does not use CPAP  . Hx of echocardiogram     Echo 7/14:  Mod LVH, EF 55-60%, Gr 1 DD, mild MR, PASP 36  . AKI (acute kidney injury) ([place])     a. 6/14: resolved after d/c ARB;  b. RA U/S 7/14: no RA stenosis  . Atrial flutter ([place])   . Stroke ([place])   . Shortness of breath   . Renal stone 04/15/2014    Past Surgical History  Procedure Laterality Date  . Partial colectomy      for diverticuli  . Orchiectomy    . Umbilical hernia repair    . Cervical fusion    . Coronary artery bypass graft  2005    LIMA graft to LAD,saphenous vein graft to diag.,circumflex, marginal,and to the RCA  . Cardiac catheterization    . Appendectomy    . Esophagogastroduodenoscopy  02/10/2012    Procedure: ESOPHAGOGASTRODUODENOSCOPY (EGD);   Surgeon: Cleotis Nipper, MD;  Location: [facility] END[facility]COPY;  Service: Endoscopy;  Laterality: N/A;  . Foreign body removal  02/10/2012    Procedure: FOREIGN BODY REMOVAL;  Surgeon: Cleotis Nipper, MD;  Location: [facility];  Service: Endoscopy;  Laterality: N/A;  . Back surgery    . Esophagogastroduodenoscopy  06/29/2012    Procedure: ESOPHAGOGASTRODUODENOSCOPY (EGD);  Surgeon: Winfield Cunas., MD;  Location: [facility] ENDOSCOPY;  Service: Endoscopy;  Laterality: N/A;  . Foreign body removal  06/29/2012    Procedure: FOREIGN BODY REMOVAL;  Surgeon: Winfield Cunas., MD;  Location: [facility] ENDOSCOPY;  Service: Endoscopy;  Laterality: N/A;  . Savory dilation  06/29/2012    Procedure: SAVORY DILATION;  Surgeon: Winfield Cunas., MD;  Location: [facility] ENDOSCOPY;  Service: Endoscopy;  Laterality: N/A;  . Tee without cardioversion N/A 11/17/2013    Procedure: TRANSESOPHAGEAL ECHOCARDIOGRAM (TEE);  Surgeon: Larey Dresser, MD;  Location: [facility];  Service: Cardiovascular;  Laterality: N/A;  . Cystoscopy/retrograde/ureteroscopy/stone extraction with basket Left 04/16/2014    Procedure: CYSTOSCOPY/RETROGRADE/LEFT URETEROSCOPY/STONE EXTRACTION WITH BASKET AND LASER LITHOTRIPSY;  Surgeon: Malka So, MD;  Location: [facility] ORS;  Service: Urology;  Laterality: Left;  With STENT  . Left heart catheterization with coronary angiogram N/A 04/02/2012    Procedure: LEFT HEART CATHETERIZATION WITH CORONARY ANGIOGRAM;  Surgeon: Sinclair Grooms, MD;  Location: [facility] CATH LAB;  Service: Cardiovascular;  Laterality: N/A;  . Loop recorder implant N/A 11/17/2013    Procedure: LOOP RECORDER IMPLANT;  Surgeon: Deboraha Sprang, MD;  Location: [facility] CATH LAB;  Service: Cardiovascular;  Laterality: N/A;    There were no vitals filed for this visit.  Visit Diagnosis:  Decreased activity tolerance  Impaired mobility and ADLs      Subjective Assessment - 09/26/15 1114    Pertinent History see epic    Patient Stated Goals I want my  endurance to get better   Currently in Pain? Yes   Pain Score 8    Pain Location Back   Pain Orientation Lower   Pain Descriptors / Indicators Aching   Pain Type Chronic pain   Pain Onset More than a month ago   Pain Frequency Constant   Aggravating Factors  nothing really   Pain Relieving Factors nothing really           [facility] OT Assessment - 09/26/15 1115    Assessment   Diagnosis Seizure disorder/stroke   Referring Provider Dr. Rosalin Hawking   Onset Date 11/14/13   Prior Therapy PT, OT and ST after stroke   Precautions   Precautions Fall   Precaution Comments On home O2 for about a year.  Pt on 3L at all times   Restrictions   Weight Bearing Restrictions No   Balance Screen   Has the patient fallen in the past 6 months Yes   How many times? 1  Pt with PT eval today   Home  Environment   Family/patient expects to be discharged to: Private residence   Living Arrangements Spouse/significant other  24 year old grandson   Available Help at Discharge Available 24 hours/day   Type of [place] Shower/Tub [place] Handicapped height   Additional Comments grab bars in shower,  pt stands in the shower but there is a chair in the shower if he needs it  also grab bar near toilet   Prior Function   Level of [place] Retired   ADL   Eating/Feeding Independent   Grooming Independent   Restaurant manager, fast food   Lower Body Bathing Independent   Upper Body Dressing Increased time  needs assist for shoes due to hard to bend over   Lower Body Dressing Minimal assistance  shoes- hard to bend over to breathe    Materials engineer Independent   Where Assessed - Arts administrator Modified independent   IADL   Shopping Needs to be accompanied on any shopping trip   Light Housekeeping Performs light daily tasks such as dishwashing, bed making    Meal Prep Prepares adequate meal if supplied with ingredients   Community Mobility Relies on family or friends for transportation   Medication Management Is not capable of dispensing or managing own medication  I take so many I couldn't keep therm straight   Financial Management Requires assistance   Mobility   Mobility Status Needs assist   Mobility Status Comments Needs assist in the community does not feel comfortable in the community without assistance.  Wife provides supervision or  pt has a scooter if long distance.    Written Expression   Dominant Hand Left   Vision - History   Baseline Vision Wears glasses only for reading   Vision Assessment   Comment PT denies any visual issues   Activity Tolerance   Activity Tolerance Tolerate 30+ min activity without fatigue  pt can do bath/dress without a rest    Cognition   Area of Impairment Memory   Memory Comments short term and recalling new information since seizures and stroke   Problem Solving Slow processing   Sensation   Light Touch Appears Intact   Hot/Cold Appears Intact   Proprioception Appears Intact   Coordination   Gross Motor Movements are Fluid and Coordinated Yes   Fine Motor Movements are Fluid and Coordinated Yes   Tone   Assessment Location Left Upper Extremity   ROM / Strength   AROM / PROM / Strength AROM;Strength   AROM   Overall AROM  Within functional limits for tasks performed   Overall AROM Comments For BUE's   Strength   Overall Strength Within functional limits for tasks performed   Hand Function   Right Hand Gross Grasp Functional   Right Hand Grip (lbs) 50   Left Hand Gross Grasp Functional   Left Hand Grip (lbs) 50   LUE Tone   LUE Tone Within Functional Limits                         OT Education - October 12, 2015 1311    Education provided Yes   Education Details ADL equipkment   Person(s) Educated Patient;Spouse   Methods Explanation;Demonstration;Handout   Comprehension  Verbalized understanding;Returned demonstration          OT Short Term Goals - 10/12/15 1312    OT SHORT TERM GOAL #1   Title n/a           OT Long Term Goals - 10/12/2015 1312    OT LONG TERM GOAL #1   Title n/a               Plan - 10-12-15 1312    Clinical Impression Statement Pt is a 70 year old male with complicated medical history including CAD wtih CABG 2005, seizure disorder, CVA in 2015, DM, HTN and other medical issues (see PMH);  At this time pt presents to the outpatient center with decreased activity tolerance, and SOB with bending over. Demonstrated use of AE for LB ADLs to increase independence however do not  feel pt requires any further OT at this time (pt to be seen by PT for activity tolerance).    Pt will benefit from skilled therapeutic intervention in order to improve on the following deficits (Retired) Decreased activity tolerance;Difficulty walking   Rehab Potential --  n/a   OT Frequency --  n/a   Plan no further OT needs at this time.   Consulted and Agree with Plan of Care Patient;Family member/caregiver   Family Member Consulted wife          G-Codes - 10-12-2015 1318    Functional Assessment Tool Used interview, instruction of AE equipment   Functional Limitation Self care   Self Care Current Status [phone]) At least 20 percent but less than 40 percent impaired, limited or restricted   Self Care Goal Status ([id number]) At least 1 percent but less than 20 percent impaired, limited or restricted   Self Care Discharge Status [phone]) At least 1  percent but less than 20 percent impaired, limited or restricted      Problem List Patient Active Problem List   Diagnosis Date Noted  . Cerebrovascular accident (CVA) due to embolism of right anterior cerebral artery ([place]) 09/19/2015  . History of CVA with residual deficit 09/19/2015  . Type 2 diabetes mellitus with circulatory disorder ([place]) 07/12/2015  . HLD (hyperlipidemia) 07/12/2015  . Essential  hypertension 07/12/2015  . Coronary artery disease involving coronary bypass graft of native heart with unspecified angina pectoris 07/12/2015  . Intracranial carotid stenosis 07/12/2015  . Morbid obesity ([place]) 02/21/2015  . Acute ischemic stroke ([place])   . Dysarthria   . History of pulmonary embolism   . Stroke ([place]) 02/11/2015  . CVA (cerebral infarction) 02/11/2015  . Acute right ACA stroke ([place]) 02/11/2015  . Lower extremity weakness 01/08/2015  . Status post placement of implantable loop recorder 08/01/2014  . Chronic respiratory failure, unspecified whether with hypoxia or hypercapnia ([place]) 05/19/2014  . Pulmonary embolism ([place]) 05/19/2014  . Hemiparesis and speech and language deficit as late effects of stroke ([place]) 04/19/2014  . Ureteral colic 83/25/4982  . Left ureteral stone 04/15/2014  . Obstructive sleep apnea 12/07/2013  . Obesity, unspecified 12/07/2013  . Embolic stroke ([place]) 64/15/8309  . Cough 06/09/2013  . S/P lumbar spine operation 06/02/2013  . Nerve root pain 06/02/2013  . Pleural plaque consistent with asbestos exposure by CT 10/2012 02/17/2013  . Renal failure 02/13/2013  . Dyspnea 02/11/2013  . Dysphagia, pharyngoesophageal phase 05/12/2012  . Seizure disorder ([place]) 04/04/2012  . CAD (coronary artery disease) 06/13/2011  . HTN (hypertension) 06/13/2011  . Diabetes mellitus ([place]) 06/13/2011  . History of seizure disorder   . Depression   . Back pain   . Hearing loss   . Arrhythmia   . Diverticulitis   . H/O alcohol abuse   . Personal history of other diseases of circulatory system 05/29/2011    Quay Burow, OTR/L 09/26/2015, 3:37 PM  [facility] [address] [place], [place], [address] Phone: [phone]   Fax:  [phone]  Name: JOHNATAN BASKETTE MRN: [id number] Date of Birth: [birth year]

## 2015-09-26 NOTE — Therapy (Signed)
[facility] [address] [place], [place], [address] Phone: [phone]   Fax:  [phone]  Physical Therapy Evaluation  Patient Details  Name: [name] MRN: [id number] Date of Birth: 08-01-1946 Referring Provider: Rosalin Hawking, MD  Encounter Date: 09/26/2015      PT End of Session - 09/26/15 1050    Visit Number 1   Number of Visits 9   Date for PT Re-Evaluation 10/26/15   Authorization Type Medicare -G codes every 10th visit   PT Start Time 0950   PT Stop Time 1040   PT Time Calculation (min) 50 min   Activity Tolerance Patient tolerated treatment well   Behavior During Therapy [facility] for tasks assessed/performed      Past Medical History  Diagnosis Date  . Coronary artery disease     a. Low level exercise Lex MV 3/14: low risk, EF 62%, inf defect 2/2 diaph attenuation vs artifiact, small area of scar possible, no ischemia  . Hyperlipidemia   . Hypertension   . History of seizure disorder   . History of atrial flutter   . Depression   . Back pain     persistent  . Hearing loss   . Diverticulitis   . H/O alcohol abuse   . Diabetes mellitus     controled by diet  . GERD (gastroesophageal reflux disease)   . Seizures ([place])   . Headache(784.0)   . Arthritis     of spine  . Sleep apnea     does not use CPAP  . Hx of echocardiogram     Echo 7/14:  Mod LVH, EF 55-60%, Gr 1 DD, mild MR, PASP 36  . AKI (acute kidney injury) ([place])     a. 6/14: resolved after d/c ARB;  b. RA U/S 7/14: no RA stenosis  . Atrial flutter ([place])   . Stroke ([place])   . Shortness of breath   . Renal stone 04/15/2014    Past Surgical History  Procedure Laterality Date  . Partial colectomy      for diverticuli  . Orchiectomy    . Umbilical hernia repair    . Cervical fusion    . Coronary artery bypass graft  2005    LIMA graft to LAD,saphenous vein graft to diag.,circumflex, marginal,and to the RCA  . Cardiac catheterization     . Appendectomy    . Esophagogastroduodenoscopy  02/10/2012    Procedure: ESOPHAGOGASTRODUODENOSCOPY (EGD);  Surgeon: Cleotis Nipper, MD;  Location: [facility] ENDOSCOPY;  Service: Endoscopy;  Laterality: N/A;  . Foreign body removal  02/10/2012    Procedure: FOREIGN BODY REMOVAL;  Surgeon: Cleotis Nipper, MD;  Location: [facility];  Service: Endoscopy;  Laterality: N/A;  . Back surgery    . Esophagogastroduodenoscopy  06/29/2012    Procedure: ESOPHAGOGASTRODUODENOSCOPY (EGD);  Surgeon: Winfield Cunas., MD;  Location: [facility];  Service: Endoscopy;  Laterality: N/A;  . Foreign body removal  06/29/2012    Procedure: FOREIGN BODY REMOVAL;  Surgeon: Winfield Cunas., MD;  Location: [facility] ENDOSCOPY;  Service: Endoscopy;  Laterality: N/A;  . Savory dilation  06/29/2012    Procedure: SAVORY DILATION;  Surgeon: Winfield Cunas., MD;  Location: [facility];  Service: Endoscopy;  Laterality: N/A;  . Tee without cardioversion N/A 11/17/2013    Procedure: TRANSESOPHAGEAL ECHOCARDIOGRAM (TEE);  Surgeon: Larey Dresser, MD;  Location: [facility];  Service: Cardiovascular;  Laterality: N/A;  . Cystoscopy/retrograde/ureteroscopy/stone extraction with basket  Left 04/16/2014    Procedure: CYSTOSCOPY/RETROGRADE/LEFT URETEROSCOPY/STONE EXTRACTION WITH BASKET AND LASER LITHOTRIPSY;  Surgeon: Malka So, MD;  Location: [facility] ORS;  Service: Urology;  Laterality: Left;  With STENT  . Left heart catheterization with coronary angiogram N/A 04/02/2012    Procedure: LEFT HEART CATHETERIZATION WITH CORONARY ANGIOGRAM;  Surgeon: Sinclair Grooms, MD;  Location: [facility] CATH LAB;  Service: Cardiovascular;  Laterality: N/A;  . Loop recorder implant N/A 11/17/2013    Procedure: LOOP RECORDER IMPLANT;  Surgeon: Deboraha Sprang, MD;  Location: [facility] CATH LAB;  Service: Cardiovascular;  Laterality: N/A;    There were no vitals filed for this visit.  Visit Diagnosis:  Generalized weakness  Unsteadiness  Decreased activity  tolerance  Poor balance      Subjective Assessment - 09/26/15 0953    Subjective "I had some physical therapy last year and it really helped, [facility] but then they quit, so we got this therapy through private insurance."    Limitations House hold activities;Walking;Standing   Patient Stated Goals "Get as good as I can."    Currently in Pain? Yes   Pain Score 8   8/10 at baseline   Pain Location Back   Pain Orientation Lower   Pain Descriptors / Indicators Aching   Pain Type Chronic pain   Pain Onset More than a month ago   Pain Frequency Constant   Aggravating Factors  nothing really   Pain Relieving Factors nothing really             [facility] PT Assessment - 09/26/15 0001    Assessment   Medical Diagnosis Seizure disorder, CVA   Referring Provider Rosalin Hawking, MD   Onset Date/Surgical Date --  has had balance and mobility problems since 1st CVA, 11/14/13   Prior Therapy Has had some OP therapy, unable to state where   Precautions   Precautions Fall   Precaution Comments on home O2 due to asbestos outside of lungs   Restrictions   Weight Bearing Restrictions No   Balance Screen   Has the patient fallen in the past 6 months Yes   How many times? 1   Has the patient had a decrease in activity level because of a fear of falling?  No   Is the patient reluctant to leave their home because of a fear of falling?  No   Home Environment   Living Environment Private residence   Living Arrangements Spouse/significant other   Available Help at Discharge Available 24 hours/day  there except for running errands   Type of [place] to enter   Entrance Stairs-Number of Steps 10   Entrance Stairs-Rails Right;Left;Can reach both   [place] of Steps 4-5 up to bedrooms  also goes into basement from time to time   Alternate Level Stairs-Rails Right   [place] - 4 wheels;Electric scooter;Shower  seat;Grab bars - tub/shower;Other (comment)  uses scooter in community, walk in shower, handicapped commo   Prior Function   Level of [place] Retired   Leisure I like to get out and drive around   [employer]   Overall Cognitive Status Impaired/Different from baseline   Area of Impairment Memory;Following commands;Problem solving   Memory Decreased recall of precautions;Decreased short-term memory   Memory Comments short term memory    Following Commands Follows one step commands inconsistently;Follows one step commands with increased  time   Problem Solving Slow processing   Sensation   Light Touch Appears Intact   Stereognosis Not tested   Hot/Cold Appears Intact  per pt report   Proprioception Appears Intact   Coordination   Gross Motor Movements are Fluid and Coordinated Yes   Fine Motor Movements are Fluid and Coordinated Yes   Heel Shin Test WFL   ROM / Strength   AROM / PROM / Strength Strength   Strength   Overall Strength Within functional limits for tasks performed;Other (comment)  L hip flex 4/5, all others 5/5   Overall Strength Comments grossly WFL during formal testing   Transfers   Transfers Sit to Stand;Stand to Sit   Sit to Stand 6: Modified independent (Device/Increase time)   Stand to Sit 6: Modified independent (Device/Increase time)   Ambulation/Gait   Ambulation/Gait Yes   Ambulation/Gait Assistance 6: Modified independent (Device/Increase time);5: Supervision   Ambulation/Gait Assistance Details Mod I with use of rollator, S for safety without device, states he ambulates at home without AD, carries O2.    Ambulation Distance (Feet) 345 Feet   Assistive device 4-wheeled walker;None   Gait Pattern Decreased arm swing - right;Decreased arm swing - left;Decreased stride length;Wide base of support   Ambulation Surface Level;Indoor   Gait velocity 2.49 ft/sec    Stairs Yes   Stairs Assistance 5: Supervision   Stair Management  Technique Two rails;Step to pattern;Forwards   Number of Stairs 4   Height of Stairs 6   Standardized Balance Assessment   Standardized Balance Assessment Dynamic Gait Index   Dynamic Gait Index   Level Surface Normal   Change in Gait Speed Mild Impairment   Gait with Horizontal Head Turns Mild Impairment   Gait with Vertical Head Turns Mild Impairment   Gait and Pivot Turn Mild Impairment   Step Over Obstacle Mild Impairment   Step Around Obstacles Mild Impairment   Steps Moderate Impairment   Total Score 16   DGI comment: Scores of 19 or less are predictive of falls in older community living adults                           PT Education - 09/26/15 1049    Education provided Yes   Education Details Education on POC, evaluation findings, goals   Person(s) Educated Patient;Spouse   Methods Explanation   Comprehension Verbalized understanding             PT Long Term Goals - 09/26/15 1055    PT LONG TERM GOAL #1   Title Pt will be independent with HEP in order to indicate decreased fall risk and improved functional mobility.  (Target Date: 10/24/15)   PT LONG TERM GOAL #2   Title Pt will increase DGI score to 20/24 in order to indicate decreased fall risk.     PT LONG TERM GOAL #3   Title Will assess 6MWT and improve distance by 150' in order to indicate increase in functional endurance.    PT LONG TERM GOAL #4   Title Pt will report initiation into community fitness program in order to indicate continuation of progress made in therapy.     PT LONG TERM GOAL #5   Title Pt will increase gait speed >2.62 ft/sec in order to indicate pt is functional community ambulator.                Plan - 09/26/15 1051  Clinical Impression Statement Pt presents with generalized weakness, decreased balance and decreased endurance.  Reports that these have been issues since CVA on 11/14/13.  Note that he has since had seizure/TIA episodes in June and July in 2017  but with no symptoms persisting from these episodes.  Note history of CAD, HTN, DM, on chronic O2 due to asbestos that could all affect therapy progress.  Note upon PT evaluation, gait speed is 2.49 ft/sec, indicative of limited community ambulator, and DGI score of 16/24, indicative of high fall risk.   Feel that pt is unstable due to history of seizures, but presents as relatvely low complexity.     Pt will benefit from skilled therapeutic intervention in order to improve on the following deficits Abnormal gait;Cardiopulmonary status limiting activity;Decreased activity tolerance;Decreased balance;Decreased cognition;Decreased endurance;Decreased mobility;Decreased strength;Impaired flexibility;Impaired perceived functional ability;Postural dysfunction;Obesity;Pain  pain is chronic and will not be addressing during this episode.   Rehab Potential Good   Clinical Impairments Affecting Rehab Potential compliance and co-morbidiities   PT Frequency 2x / week   PT Duration 4 weeks   PT Treatment/Interventions ADLs/Self Care Home Management;Gait training;Functional mobility training;DME Instruction;Stair training;Therapeutic activities;Therapeutic exercise;Balance training;Neuromuscular re-education;Cognitive remediation;Patient/family education;Energy conservation   PT Next Visit Plan 6MWT, est HEP for BLE strengthening and higher level balance   Consulted and Agree with Plan of Care Patient;Family member/caregiver   Family Member Consulted wife          G-Codes - 10/01/15 1100    Functional Assessment Tool Used DGI: 16/24   Functional Limitation Mobility: Walking and moving around   Mobility: Walking and Moving Around Current Status [phone]) At least 20 percent but less than 40 percent impaired, limited or restricted   Mobility: Walking and Moving Around Goal Status [phone]) At least 1 percent but less than 20 percent impaired, limited or restricted       Problem List Patient Active Problem  List   Diagnosis Date Noted  . Cerebrovascular accident (CVA) due to embolism of right anterior cerebral artery ([place]) 09/19/2015  . History of CVA with residual deficit 09/19/2015  . Type 2 diabetes mellitus with circulatory disorder ([employer]) 07/12/2015  . HLD (hyperlipidemia) 07/12/2015  . Essential hypertension 07/12/2015  . Coronary artery disease involving coronary bypass graft of native heart with unspecified angina pectoris 07/12/2015  . Intracranial carotid stenosis 07/12/2015  . Morbid obesity ([place]) 02/21/2015  . Acute ischemic stroke ([place])   . Dysarthria   . History of pulmonary embolism   . Stroke ([place]) 02/11/2015  . CVA (cerebral infarction) 02/11/2015  . Acute right ACA stroke ([place]) 02/11/2015  . Lower extremity weakness 01/08/2015  . Status post placement of implantable loop recorder 08/01/2014  . Chronic respiratory failure, unspecified whether with hypoxia or hypercapnia ([place]) 05/19/2014  . Pulmonary embolism ([place]) 05/19/2014  . Hemiparesis and speech and language deficit as late effects of stroke ([place]) 04/19/2014  . Ureteral colic 78/67/6720  . Left ureteral stone 04/15/2014  . Obstructive sleep apnea 12/07/2013  . Obesity, unspecified 12/07/2013  . Embolic stroke ([place]) 94/70/9628  . Cough 06/09/2013  . S/P lumbar spine operation 06/02/2013  . Nerve root pain 06/02/2013  . Pleural plaque consistent with asbestos exposure by CT 10/2012 02/17/2013  . Renal failure 02/13/2013  . Dyspnea 02/11/2013  . Dysphagia, pharyngoesophageal phase 05/12/2012  . Seizure disorder ([place]) 04/04/2012  . CAD (coronary artery disease) 06/13/2011  . HTN (hypertension) 06/13/2011  . Diabetes mellitus ([place]) 06/13/2011  . History of seizure disorder   .  Depression   . Back pain   . Hearing loss   . Arrhythmia   . Diverticulitis   . H/O alcohol abuse   . Personal history of other diseases of circulatory system 05/29/2011    Cameron Sprang, PT, MPT [facility] [address] [place], [place], [address] Phone: [phone]   Fax:  [phone] 09/26/2015, 11:01 AM  Name: RUI WORDELL MRN: [id number] Date of Birth: [birth year]

## 2015-09-30 LAB — CUP PACEART REMOTE DEVICE CHECK: Date Time Interrogation Session: 20161219053752

## 2015-10-02 ENCOUNTER — Encounter: Payer: Medicare Other | Admitting: Occupational Therapy

## 2015-10-02 ENCOUNTER — Encounter: Payer: Self-pay | Admitting: Rehabilitation

## 2015-10-02 ENCOUNTER — Ambulatory Visit: Payer: Medicare Other | Attending: Neurology | Admitting: Rehabilitation

## 2015-10-02 DIAGNOSIS — R2689 Other abnormalities of gait and mobility: Secondary | ICD-10-CM | POA: Insufficient documentation

## 2015-10-02 DIAGNOSIS — Z7409 Other reduced mobility: Secondary | ICD-10-CM | POA: Diagnosis not present

## 2015-10-02 DIAGNOSIS — R531 Weakness: Secondary | ICD-10-CM

## 2015-10-02 DIAGNOSIS — R6889 Other general symptoms and signs: Secondary | ICD-10-CM | POA: Diagnosis not present

## 2015-10-02 DIAGNOSIS — R2681 Unsteadiness on feet: Secondary | ICD-10-CM | POA: Diagnosis not present

## 2015-10-02 NOTE — Patient Instructions (Signed)
Functional Quadriceps: Sit to Stand    Sit on edge of chair, feet flat on floor. Stand upright, extending knees fully. Repeat __10__ times per set. Do _1_ sets per session. Do _1-2___ sessions per day.  http://orth.exer.us/734   Copyright  VHI. All rights reserved.   "I love a Database administrator    Stand by counter top at home for support.  March in place 10 times on each leg slowly and as high as you can.  BREATH!! Do __1__ sessions per day.  http://gt2.exer.us/344   Copyright  VHI. All rights reserved.   ABDUCTION: Standing (Active)    Stand facing counter top with hands on counter for support, feet flat. Lift right leg out to side. Stand tall, don't hunch over counter top.   Complete _1__ sets of _10__ repetitions on each side. Perform _1__ sessions per day.  http://gtsc.exer.us/110   Copyright  VHI. All rights reserved.   EXTENSION: Standing (Active)    Stand, both feet flat. Draw right leg behind body as far as possible. Complete __1_ sets of __10_ repetitions on each side. Perform _1 sessions per day.  http://gtsc.exer.us/76   Copyright  VHI. All rights reserved.   Tandem Walking    Walk along the counter top and keep one hand on counter.  Place each foot directly in front of other, heel of one foot touching toes of other foot with each step. Both feet straight ahead.  Do 2 laps down and back before sitting.    Copyright  VHI. All rights reserved.

## 2015-10-02 NOTE — Therapy (Signed)
[facility] [address] [place], [place], [address] Phone: [phone]   Fax:  [phone]  Physical Therapy Treatment  Patient Details  Name: [name] MRN: [id number] Date of Birth: [birth year] Referring Provider: Rosalin Hawking, MD  Encounter Date: 10/02/2015      PT End of Session - 10/02/15 1508    Visit Number 2   Number of Visits 9   Date for PT Re-Evaluation 10/26/15   Authorization Type Medicare -G codes every 10th visit   PT Start Time 0175   PT Stop Time 1530   PT Time Calculation (min) 45 min   Activity Tolerance Patient tolerated treatment well   Behavior During Therapy [facility] for tasks assessed/performed      Past Medical History  Diagnosis Date  . Coronary artery disease     a. Low level exercise Lex MV 3/14: low risk, EF 62%, inf defect 2/2 diaph attenuation vs artifiact, small area of scar possible, no ischemia  . Hyperlipidemia   . Hypertension   . History of seizure disorder   . History of atrial flutter   . Depression   . Back pain     persistent  . Hearing loss   . Diverticulitis   . H/O alcohol abuse   . Diabetes mellitus     controled by diet  . GERD (gastroesophageal reflux disease)   . Seizures ([place])   . Headache(784.0)   . Arthritis     of spine  . Sleep apnea     does not use CPAP  . Hx of echocardiogram     Echo 7/14:  Mod LVH, EF 55-60%, Gr 1 DD, mild MR, PASP 36  . AKI (acute kidney injury) ([place])     a. 6/14: resolved after d/c ARB;  b. RA U/S 7/14: no RA stenosis  . Atrial flutter ([place])   . Stroke ([place])   . Shortness of breath   . Renal stone 04/15/2014    Past Surgical History  Procedure Laterality Date  . Partial colectomy      for diverticuli  . Orchiectomy    . Umbilical hernia repair    . Cervical fusion    . Coronary artery bypass graft  2005    LIMA graft to LAD,saphenous vein graft to diag.,circumflex, marginal,and to the RCA  . Cardiac catheterization     . Appendectomy    . Esophagogastroduodenoscopy  02/10/2012    Procedure: ESOPHAGOGASTRODUODENOSCOPY (EGD);  Surgeon: Cleotis Nipper, MD;  Location: [facility] ENDOSCOPY;  Service: Endoscopy;  Laterality: N/A;  . Foreign body removal  02/10/2012    Procedure: FOREIGN BODY REMOVAL;  Surgeon: Cleotis Nipper, MD;  Location: [place];  Service: Endoscopy;  Laterality: N/A;  . Back surgery    . Esophagogastroduodenoscopy  06/29/2012    Procedure: ESOPHAGOGASTRODUODENOSCOPY (EGD);  Surgeon: Winfield Cunas., MD;  Location: [facility] ENDOSCOPY;  Service: Endoscopy;  Laterality: N/A;  . Foreign body removal  06/29/2012    Procedure: FOREIGN BODY REMOVAL;  Surgeon: Winfield Cunas., MD;  Location: [facility] ENDOSCOPY;  Service: Endoscopy;  Laterality: N/A;  . Savory dilation  06/29/2012    Procedure: SAVORY DILATION;  Surgeon: Winfield Cunas., MD;  Location: [facility] ENDOSCOPY;  Service: Endoscopy;  Laterality: N/A;  . Tee without cardioversion N/A 11/17/2013    Procedure: TRANSESOPHAGEAL ECHOCARDIOGRAM (TEE);  Surgeon: Larey Dresser, MD;  Location: [place];  Service: Cardiovascular;  Laterality: N/A;  . Cystoscopy/retrograde/ureteroscopy/stone extraction with basket  Left 04/16/2014    Procedure: CYSTOSCOPY/RETROGRADE/LEFT URETEROSCOPY/STONE EXTRACTION WITH BASKET AND LASER LITHOTRIPSY;  Surgeon: Malka So, MD;  Location: [facility] ORS;  Service: Urology;  Laterality: Left;  With STENT  . Left heart catheterization with coronary angiogram N/A 04/02/2012    Procedure: LEFT HEART CATHETERIZATION WITH CORONARY ANGIOGRAM;  Surgeon: Sinclair Grooms, MD;  Location: [facility] CATH LAB;  Service: Cardiovascular;  Laterality: N/A;  . Loop recorder implant N/A 11/17/2013    Procedure: LOOP RECORDER IMPLANT;  Surgeon: Deboraha Sprang, MD;  Location: [facility] CATH LAB;  Service: Cardiovascular;  Laterality: N/A;    There were no vitals filed for this visit.  Visit Diagnosis:  Unsteadiness  Generalized weakness  Decreased activity  tolerance  Poor balance      Subjective Assessment - 10/02/15 1453    Subjective "I'm really tired today.  I had a doctor's appt this morning and have been up since."    Limitations House hold activities;Walking;Standing   Patient Stated Goals "Get as good as I can."    Currently in Pain? Yes   Pain Score 8    Pain Location Back   Pain Orientation Lower   Pain Descriptors / Indicators Aching   Pain Type Chronic pain   Pain Onset More than a month ago   Pain Frequency Constant   Aggravating Factors  nothing really   Pain Relieving Factors nothing            TE:  Performed 6 MWT during session to better assess functional endurance.  Note that distance was 103' with use of rollator and needing to take multiple seated rest breaks during testing due to fatigue.  HR prior to testing was 74 bpm, SaO2 93% and following test was 88-89 bpm and SaO2 92%.  Discussed results and meaning with pt and wife and goals related to improving endurance with walking program.  Initiated HEP for BLE strengthening and endurance, see pt instruction for full details on exercises and reps.                       PT Education - 10/02/15 1454    Education provided Yes   Education Details HEP, see pt instruction   Person(s) Educated Patient;Spouse   Methods Explanation;Handout   Comprehension Verbalized understanding             PT Long Term Goals - 10/02/15 1508    PT LONG TERM GOAL #1   Title Pt will be independent with HEP in order to indicate decreased fall risk and improved functional mobility.  (Target Date: 10/24/15)   PT LONG TERM GOAL #2   Title Pt will increase DGI score to 20/24 in order to indicate decreased fall risk.     PT LONG TERM GOAL #3   Title Will assess 6MWT and improve distance by 150' in order to indicate increase in functional endurance.    Baseline 537' on 10/02/15   PT LONG TERM GOAL #4   Title Pt will report initiation into community fitness program in  order to indicate continuation of progress made in therapy.     PT LONG TERM GOAL #5   Title Pt will increase gait speed >2.62 ft/sec in order to indicate pt is functional community ambulator.                Plan - 10/02/15 1508    Clinical Impression Statement Skilled session focused on initiation of HEP for BLE strengthening and  endurance.  Also assessed 6MWT during session with distance of 537', indicative of severely impaired endurance.  Initiated discussion regarding walking program, pt and wife verbalized understanding.    Pt will benefit from skilled therapeutic intervention in order to improve on the following deficits Abnormal gait;Cardiopulmonary status limiting activity;Decreased activity tolerance;Decreased balance;Decreased cognition;Decreased endurance;Decreased mobility;Decreased strength;Impaired flexibility;Impaired perceived functional ability;Postural dysfunction;Obesity;Pain  pain is chronic and will not be addressing during this episode.   Rehab Potential Good   Clinical Impairments Affecting Rehab Potential compliance and co-morbidiities   PT Frequency 2x / week   PT Duration 4 weeks   PT Treatment/Interventions ADLs/Self Care Home Management;Gait training;Functional mobility training;DME Instruction;Stair training;Therapeutic activities;Therapeutic exercise;Balance training;Neuromuscular re-education;Cognitive remediation;Patient/family education;Energy conservation   PT Next Visit Plan Assess HEP for BLE strengthening (compliance), initiated walking program-follow up and provide written instruction if needed, add balance to HEP   Consulted and Agree with Plan of Care Patient;Family member/caregiver   Family Member Consulted wife        Problem List Patient Active Problem List   Diagnosis Date Noted  . Cerebrovascular accident (CVA) due to embolism of right anterior cerebral artery ([place]) 09/19/2015  . History of CVA with residual deficit 09/19/2015  . Type 2  diabetes mellitus with circulatory disorder ([place]) 07/12/2015  . HLD (hyperlipidemia) 07/12/2015  . Essential hypertension 07/12/2015  . Coronary artery disease involving coronary bypass graft of native heart with unspecified angina pectoris 07/12/2015  . Intracranial carotid stenosis 07/12/2015  . Morbid obesity ([place]) 02/21/2015  . Acute ischemic stroke ([place])   . Dysarthria   . History of pulmonary embolism   . Stroke ([place]) 02/11/2015  . CVA (cerebral infarction) 02/11/2015  . Acute right ACA stroke ([place]) 02/11/2015  . Lower extremity weakness 01/08/2015  . Status post placement of implantable loop recorder 08/01/2014  . Chronic respiratory failure, unspecified whether with hypoxia or hypercapnia ([place]) 05/19/2014  . Pulmonary embolism ([place]) 05/19/2014  . Hemiparesis and speech and language deficit as late effects of stroke ([place]) 04/19/2014  . Ureteral colic 38/88/7579  . Left ureteral stone 04/15/2014  . Obstructive sleep apnea 12/07/2013  . Obesity, unspecified 12/07/2013  . Embolic stroke ([place]) 72/82/0601  . Cough 06/09/2013  . S/P lumbar spine operation 06/02/2013  . Nerve root pain 06/02/2013  . Pleural plaque consistent with asbestos exposure by CT 10/2012 02/17/2013  . Renal failure 02/13/2013  . Dyspnea 02/11/2013  . Dysphagia, pharyngoesophageal phase 05/12/2012  . Seizure disorder ([place]) 04/04/2012  . CAD (coronary artery disease) 06/13/2011  . HTN (hypertension) 06/13/2011  . Diabetes mellitus ([place]) 06/13/2011  . History of seizure disorder   . Depression   . Back pain   . Hearing loss   . Arrhythmia   . Diverticulitis   . H/O alcohol abuse   . Personal history of other diseases of circulatory system 05/29/2011    Cameron Sprang, PT, MPT [facility] [address] [place], [place], [address] Phone: [phone]   Fax:  [phone] 10/02/2015, 5:14 PM  Name: [name] MRN: [id number] Date of Birth:  [birth year]

## 2015-10-05 ENCOUNTER — Telehealth: Payer: Self-pay

## 2015-10-05 NOTE — Telephone Encounter (Signed)
I spoke to wife and reminded her to have patient bring in SD card or BiPAP machine to appt on Monday.

## 2015-10-06 ENCOUNTER — Ambulatory Visit: Payer: Medicare Other | Admitting: Rehabilitation

## 2015-10-06 ENCOUNTER — Encounter: Payer: Self-pay | Admitting: Rehabilitation

## 2015-10-06 DIAGNOSIS — R531 Weakness: Secondary | ICD-10-CM

## 2015-10-06 DIAGNOSIS — R2681 Unsteadiness on feet: Secondary | ICD-10-CM | POA: Diagnosis not present

## 2015-10-06 DIAGNOSIS — R6889 Other general symptoms and signs: Secondary | ICD-10-CM

## 2015-10-06 DIAGNOSIS — R2689 Other abnormalities of gait and mobility: Secondary | ICD-10-CM | POA: Diagnosis not present

## 2015-10-06 DIAGNOSIS — Z7409 Other reduced mobility: Secondary | ICD-10-CM | POA: Diagnosis not present

## 2015-10-06 NOTE — Therapy (Signed)
[facility] [address] [place], [place], [address] Phone: [phone]   Fax:  [phone]  Physical Therapy Treatment  Patient Details  Name: [name] MRN: [id number] Date of Birth: 01-Dec-1945 Referring Provider: Rosalin Hawking, MD  Encounter Date: 10/06/2015      PT End of Session - 10/06/15 1643    Visit Number 3   Number of Visits 9   Date for PT Re-Evaluation 10/26/15   Authorization Type Medicare -G codes every 10th visit   PT Start Time 1400   PT Stop Time 1445   PT Time Calculation (min) 45 min   Activity Tolerance Patient tolerated treatment well   Behavior During Therapy [facility] for tasks assessed/performed      Past Medical History  Diagnosis Date  . Coronary artery disease     a. Low level exercise Lex MV 3/14: low risk, EF 62%, inf defect 2/2 diaph attenuation vs artifiact, small area of scar possible, no ischemia  . Hyperlipidemia   . Hypertension   . History of seizure disorder   . History of atrial flutter   . Depression   . Back pain     persistent  . Hearing loss   . Diverticulitis   . H/O alcohol abuse   . Diabetes mellitus     controled by diet  . GERD (gastroesophageal reflux disease)   . Seizures ([place])   . Headache(784.0)   . Arthritis     of spine  . Sleep apnea     does not use CPAP  . Hx of echocardiogram     Echo 7/14:  Mod LVH, EF 55-60%, Gr 1 DD, mild MR, PASP 36  . AKI (acute kidney injury) ([place])     a. 6/14: resolved after d/c ARB;  b. RA U/S 7/14: no RA stenosis  . Atrial flutter ([place])   . Stroke ([place])   . Shortness of breath   . Renal stone 04/15/2014    Past Surgical History  Procedure Laterality Date  . Partial colectomy      for diverticuli  . Orchiectomy    . Umbilical hernia repair    . Cervical fusion    . Coronary artery bypass graft  2005    LIMA graft to LAD,saphenous vein graft to diag.,circumflex, marginal,and to the RCA  . Cardiac catheterization     . Appendectomy    . Esophagogastroduodenoscopy  02/10/2012    Procedure: ESOPHAGOGASTRODUODENOSCOPY (EGD);  Surgeon: Cleotis Nipper, MD;  Location: [facility] ENDOSCOPY;  Service: Endoscopy;  Laterality: N/A;  . Foreign body removal  02/10/2012    Procedure: FOREIGN BODY REMOVAL;  Surgeon: Cleotis Nipper, MD;  Location: [facility];  Service: Endoscopy;  Laterality: N/A;  . Back surgery    . Esophagogastroduodenoscopy  06/29/2012    Procedure: ESOPHAGOGASTRODUODENOSCOPY (EGD);  Surgeon: Winfield Cunas., MD;  Location: [facility]OS[facility];  Service: Endoscopy;  Laterality: N/A;  . Foreign body removal  06/29/2012    Procedure: FOREIGN BODY REMOVAL;  Surgeon: Winfield Cunas., MD;  Location: [facility] ENDOSCOPY;  Service: Endoscopy;  Laterality: N/A;  . Savory dilation  06/29/2012    Procedure: SAVORY DILATION;  Surgeon: Winfield Cunas., MD;  Location: [facility] ENDOSCOPY;  Service: Endoscopy;  Laterality: N/A;  . Tee without cardioversion N/A 11/17/2013    Procedure: TRANSESOPHAGEAL ECHOCARDIOGRAM (TEE);  Surgeon: Larey Dresser, MD;  Location: [place];  Service: Cardiovascular;  Laterality: N/A;  . Cystoscopy/retrograde/ureteroscopy/stone extraction with basket  Left 04/16/2014    Procedure: CYSTOSCOPY/RETROGRADE/LEFT URETEROSCOPY/STONE EXTRACTION WITH BASKET AND LASER LITHOTRIPSY;  Surgeon: Malka So, MD;  Location: [facility];  Service: Urology;  Laterality: Left;  With STENT  . Left heart catheterization with coronary angiogram N/A 04/02/2012    Procedure: LEFT HEART CATHETERIZATION WITH CORONARY ANGIOGRAM;  Surgeon: Sinclair Grooms, MD;  Location[facility] CATH LAB;  Service: Cardiovascular;  Laterality: N/A;  . Loop recorder implant N/A 11/17/2013    Procedure: LOOP RECORDER IMPLANT;  Surgeon: Deboraha Sprang, MD;  [facility] CATH LAB;  Service: Cardiovascular;  Laterality: N/A;    There were no vitals filed for this visit.  Visit Diagnosis:  Unsteadiness  Generalized weakness  Decreased activity  tolerance  Poor balance      Subjective Assessment - 10/06/15 1410    Subjective "I fell on Wednesday at my day school program in the parking lot.  My walker went off the side of the sidewalk, I guess I didn't see it and I fell."    Limitations House hold activities;Walking;Standing   Patient Stated Goals "Get as good as I can."    Currently in Pain? Yes   Pain Score 8    Pain Location Back   Pain Orientation Lower   Pain Descriptors / Indicators Aching   Pain Type Chronic pain   Pain Onset More than a month ago   Pain Frequency Constant   Aggravating Factors  noting   Pain Relieving Factors nothing               Self Care:  Had long discussion with pt and wife regarding pt compliance and participation in therapy.  Wife states that he did not do his exercises since last visit and that he did not want to come to therapy today.  Provided max education the benefits of therapy, however that pt would have to be responsible for carryover at home to see real improvements.  Also discussed safety at day school program and recommending increased S when ambulating outdoors.  Both verbalized understanding.   NMR:  Addressed balance as well as strengthening during session with activities as follows; cone tapping with side stepping while on red gym mat with focus on slow controlled steps to increase time in SLS and improve L lateral weight shift.  Note that he tends to have increased difficulty when tapping RLE to cone due to this issue.  Performed x 10 reps followed by seated rest break.  Progressed task by having pt tip cone over with one LE and tip back to upright with alternate LE to increase time in SLS.  Note improvement in ability to weight shift, however still requires min/guard A to prevent overt LOB and assist intermittently with weight shift.  Then performed slow marching over two red gym mats forwards and backwards x 2 reps (again with seated rest breaks in between due to fatigue)  with mod A initially fading to min A due to improved weight shift.  Ended balance tasks with tandem gait forwards/backwards x 2 reps with mod fading to min A as above but seemed markedly improved from marching task.  Pts SaO2 remained in 90's throughout session but cues for pursed lip breathing during task.                    PT Education - 10/06/15 1413    Education provided Yes   Education Details education on importance of participation in therpay, safety at day school and  recommendation that wife call to state he needs closer S when outdoors walking with walker.    Person(s) Educated Patient;Spouse   Methods Explanation   Comprehension Verbalized understanding             PT Long Term Goals - 10/02/15 1508    PT LONG TERM GOAL #1   Title Pt will be independent with HEP in order to indicate decreased fall risk and improved functional mobility.  (Target Date: 10/24/15)   PT LONG TERM GOAL #2   Title Pt will increase DGI score to 20/24 in order to indicate decreased fall risk.     PT LONG TERM GOAL #3   Title Will assess 6MWT and improve distance by 150' in order to indicate increase in functional endurance.    Baseline 537' on 10/02/15   PT LONG TERM GOAL #4   Title Pt will report initiation into community fitness program in order to indicate continuation of progress made in therapy.     PT LONG TERM GOAL #5   Title Pt will increase gait speed >2.62 ft/sec in order to indicate pt is functional community ambulator.                Plan - 10/06/15 1644    Clinical Impression Statement Skilled session focused on long discussion with pt and wife regarding pt compliance and participation in therapy.  Wife states that he did not do his exercises since last visit and that he did not want to come to therapy today.  Provided max education the benefits of therapy, however that pt would have to be responsible for carryover at home to see real improvements.  Also discussed  safety at day school program and recommending increased S when ambulating outdoors.  Also worked on balance and strengthening during session.  Pt requires multiple rest breaks during sessions.    Pt will benefit from skilled therapeutic intervention in order to improve on the following deficits Abnormal gait;Cardiopulmonary status limiting activity;Decreased activity tolerance;Decreased balance;Decreased cognition;Decreased endurance;Decreased mobility;Decreased strength;Impaired flexibility;Impaired perceived functional ability;Postural dysfunction;Obesity;Pain  pain is chronic and will not be addressing during this episode.   Rehab Potential Good   Clinical Impairments Affecting Rehab Potential compliance and co-morbidiities   PT Frequency 2x / week   PT Duration 4 weeks   PT Treatment/Interventions ADLs/Self Care Home Management;Gait training;Functional mobility training;DME Instruction;Stair training;Therapeutic activities;Therapeutic exercise;Balance training;Neuromuscular re-education;Cognitive remediation;Patient/family education;Energy conservation   PT Next Visit Plan Assess HEP for BLE strengthening (compliance), initiated walking program-follow up and provide written instruction if needed, add balance to HEP   Consulted and Agree with Plan of Care Patient;Family member/caregiver   Family Member Consulted wife        Problem List Patient Active Problem List   Diagnosis Date Noted  . Cerebrovascular accident (CVA) due to embolism of right anterior cerebral artery ([place]) 09/19/2015  . History of CVA with residual deficit 09/19/2015  . Type 2 diabetes mellitus with circulatory disorder ([place]) 07/12/2015  . HLD (hyperlipidemia) 07/12/2015  . Essential hypertension 07/12/2015  . Coronary artery disease involving coronary bypass graft of native heart with unspecified angina pectoris 07/12/2015  . Intracranial carotid stenosis 07/12/2015  . Morbid obesity ([place]) 02/21/2015  . Acute  ischemic stroke ([place])   . Dysarthria   . History of pulmonary embolism   . Stroke ([place]) 02/11/2015  . CVA (cerebral infarction) 02/11/2015  . Acute right ACA stroke ([place]) 02/11/2015  . Lower extremity weakness 01/08/2015  . Status post placement of implantable  loop recorder 08/01/2014  . Chronic respiratory failure, unspecified whether with hypoxia or hypercapnia ([place]) 05/19/2014  . Pulmonary embolism ([place]) 05/19/2014  . Hemiparesis and speech and language deficit as late effects of stroke ([place]) 04/19/2014  . Ureteral colic 99/71/8209  . Left ureteral stone 04/15/2014  . Obstructive sleep apnea 12/07/2013  . Obesity, unspecified 12/07/2013  . Embolic stroke ([place]) 90/68/9340  . Cough 06/09/2013  . S/P lumbar spine operation 06/02/2013  . Nerve root pain 06/02/2013  . Pleural plaque consistent with asbestos exposure by CT 10/2012 02/17/2013  . Renal failure 02/13/2013  . Dyspnea 02/11/2013  . Dysphagia, pharyngoesophageal phase 05/12/2012  . Seizure disorder ([place]) 04/04/2012  . CAD (coronary artery disease) 06/13/2011  . HTN (hypertension) 06/13/2011  . Diabetes mellitus ([place]) 06/13/2011  . History of seizure disorder   . Depression   . Back pain   . Hearing loss   . Arrhythmia   . Diverticulitis   . H/O alcohol abuse   . Personal history of other diseases of circulatory system 05/29/2011    Cameron Sprang, PT, MPT [facility] [address] [place], [place], [address] Phone: [phone]   Fax:  [phone] 10/06/2015, 4:47 PM  Name: [name] MRN: [id number] Date of Birth: [birth year]

## 2015-10-09 ENCOUNTER — Encounter: Payer: Self-pay | Admitting: Physical Therapy

## 2015-10-09 ENCOUNTER — Ambulatory Visit: Payer: Medicare Other | Admitting: Physical Therapy

## 2015-10-09 ENCOUNTER — Ambulatory Visit (INDEPENDENT_AMBULATORY_CARE_PROVIDER_SITE_OTHER): Payer: Medicare Other | Admitting: Neurology

## 2015-10-09 ENCOUNTER — Encounter: Payer: Self-pay | Admitting: Neurology

## 2015-10-09 VITALS — BP 108/64 | HR 72 | Resp 20 | Ht 66.0 in | Wt 225.0 lb

## 2015-10-09 DIAGNOSIS — E669 Obesity, unspecified: Secondary | ICD-10-CM

## 2015-10-09 DIAGNOSIS — Z7409 Other reduced mobility: Secondary | ICD-10-CM | POA: Diagnosis not present

## 2015-10-09 DIAGNOSIS — R2681 Unsteadiness on feet: Secondary | ICD-10-CM

## 2015-10-09 DIAGNOSIS — G4733 Obstructive sleep apnea (adult) (pediatric): Secondary | ICD-10-CM

## 2015-10-09 DIAGNOSIS — R531 Weakness: Secondary | ICD-10-CM | POA: Diagnosis not present

## 2015-10-09 DIAGNOSIS — R2689 Other abnormalities of gait and mobility: Secondary | ICD-10-CM

## 2015-10-09 DIAGNOSIS — I639 Cerebral infarction, unspecified: Secondary | ICD-10-CM

## 2015-10-09 DIAGNOSIS — I69359 Hemiplegia and hemiparesis following cerebral infarction affecting unspecified side: Secondary | ICD-10-CM

## 2015-10-09 DIAGNOSIS — R6889 Other general symptoms and signs: Secondary | ICD-10-CM | POA: Diagnosis not present

## 2015-10-09 DIAGNOSIS — I69328 Other speech and language deficits following cerebral infarction: Secondary | ICD-10-CM | POA: Diagnosis not present

## 2015-10-09 NOTE — Progress Notes (Signed)
Subjective:    Patient ID: [name] is a 70 y.o. male.  HPI     Interim history:   [name] is a very pleasant 70 year old left-handed gentleman with an underlying complex medical history of heart disease, hyperlipidemia, obesity, hypertension, history of seizure disorder, history of atrial flutter, depression, chronic back pain, hearing loss, diverticulitis, history of alcohol and prescription medicine abuse, diabetes, reflux disease, recurrent headaches, and stroke x 3 (in the 31V and embolic stroke in 11/84), who presents for followup consultation of his obstructive sleep apnea, on treatment with BiPAP. The patient is accompanied by his wife again today. I last saw him on 04/07/2015, at which time he was compliant with BiPAP treatment.he was using a nasal mask as he could not use a fullface mask. He was in the interim admitted to the hospital from 02/11/2015 through 02/13/2015 with new onset dysarthria and he was found to have an acute stroke in the right ACA territory. He was placed on aspirin in addition to Eliquis. I reviewed the hospital records including the discharge summary. Carotid Doppler studies showed no significant carotid artery stenosis. He had a brain MRI without contrast as well as a brain MRA without contrast on 02/11/2015: Chronic right ACA infarct in the right frontal lobe. There is now a small area of acute infarct in the right frontal cortex over the convexity compatible with extension of infarction.  Atrophy and chronic microvascular ischemic changes. Chronic infarct left pons has developed since 2015.  Severe stenosis left posterior cerebral artery is unchanged from the prior study. Right anterior cerebral artery is widely patent.   He then presented to the ER on 03/07/2015 with increase in dysarthria as well as dizziness. I reviewed the emergency room records. Neurology was consulted. MRI brain without contrast on 03/07/2015 showed: No significant change from 02/11/2015.  Chronic hemorrhagic infarct right frontal lobe with area of restricted diffusion compatible with acute or subacute infarct along the superior anterior margin of the chronic infarct. Atrophy and chronic microvascular ischemia.  His wife signed him up for adult enrichment and he was going twice a week. She found this very beneficial. He was trying to do exercises at home. He had home health therapies which were helpful. He was advised to continue BiPAP therapy with full compliance.  Today, 10/09/2015: I reviewed his BiPAP compliance data from 08/25/2015 through 09/23/2015 which is a total of 30 days during which time he used his machine every night with percent used days greater than 4 hours at 100%, indicating superb compliance, average usage of 15 hours and 43 minutes (!), residual AHI of 1.2 per hour, leak at times high with the 95th percentile at 28 L/m on a pressure of 16/8 with a rate of 10. I also reviewed his most recent compliance data from the last 30 days from 09/10/2015 through 10/09/2015, and he was 100% compliant during this time. AHI was 1.5 per hour.  Today, 10/09/2015: He reports doing okay. Wife reports, he is back in outpt PT. He uses his BiPAP even during the day, as he lays in bed a lot per wife. He goes to the adult enrichment center 2 times a week, 4 hours each day. They had to change his medication from [place] to Lamictal. He is tolerating this and is titrating this up. He has had no recent seizure-like spells.  Previously:  I saw him on 10/06/2014, at which time he reported not being able to sleep through the night. He was having bouts  of diarrhea throughout the night. He was advised to start using oxygen therapy 24-7 at 2 L/m at rest and 3 L/m with exercise, as per his pulmonologist, Dr. [name]. He was seen in the interim by our nurse practitioner, Ward Givens on 03/29/2015 for follow-up of his stroke and memory loss. I reviewed the note.  I reviewed his BiPAP ST compliance  data from 03/07/2015 through 04/05/2015 which is a total of 30 days during which time he used his machine every night with percent used days greater than 4 hours at 100%, indicating superb compliance with an average usage of 13 hours and 28 minutes, residual AHI low at 1.2 per hour, leak at times high with the 95th percentile at 35.2 L/m on a pressure of 16/8 cm with a rate of 10/m.  I saw him on 04/05/2014, at which time he reported doing well with his breathing and tolerating BiPAP therapy. He had had a CT angiogram head and neck and was encouraged to discuss the findings with Dr. Leonie Man. He was compliant with BiPAP therapy and felt better. I congratulated him on his excellent compliance.  I reviewed his compliance data from 09/03/2014 through 10/02/2014 which is a total of 30 days during which time he used his machine every night with percent used days greater than 4 hours of 100%, indicating superb compliance. He is on BiPAP ST at 16/8 with a rate of 10. Residual AHI 0.4 per hour, leak acceptable with the 95th percentile at 22.4 L/m.  I first met him on 12/22/2013 at the request of my colleague Dr. Leonie Man, was him for his stroke, at which time the patient reported a prior diagnosis of obstructive sleep apnea over 5 years ago. He tried CPAP, but was not able to tolerate it, as he kept pulling the mask off. He then tried a dental appliance for about 2 years, which may have helped some and he did have several home sleep tests, per his dentist, but eventually, he stopped using the appliance, and it has been lost or chewed up by the dog. He reported snoring, daytime somnolence, and restless sleep. I suggested reevaluation for obstructive sleep apnea with a sleep study. He had a split-night sleep study on 01/24/2014. Baseline sleep efficiency was reduced at 44% with a long latency to sleep of 73.5 minutes and wake after sleep onset of 15.5 minutes with moderate sleep fragmentation noted. He had absence of  slow-wave sleep and a mildly reduced amount of REM sleep. REM latency was mildly reduced at 67.5 minutes. He had no significant periodic leg movements of sleep. His total AHI was highly elevated at 126.9 per hour, baseline oxygen saturation was 91%, nadir was 79% in REM sleep. He was titrated on CPAP. Sleep efficiency was much improved and arousal index was much improved. He had a markedly increased amount of REM sleep at 44%. He also achieved deep sleep. He exhibited central respiratory events while on CPAP therapy with more than 50% of the respiratory events being central in nature. He was first tried on CPAP from 5-12 cm as well as different EPR settings. His AHI was not reduced significantly and he had persistent lower oxygen saturations in the low 90s to high 80s. He was therefore switched to BiPAP standard mode and titrated from 12/8 cm to 14/8 cm but had residual central respiratory events was therefore placed on BiPAP ST. He was titrated from 12/7 cm to 14/7 cm and was also briefly tried on ASV. He was switched back  to BiPAP ST. Final pressure setting was 16/8 cm with a rate of 10 per minute. I reviewed his compliance data from 02/15/2014 to 03/16/2014 which is a total of 30 days during which time he used his BiPAP every night with percent use more than 4 hours at 100%, indicating superb compliance. Average usage was 10 hours and 14 minutes, sitting as mentioned 16/8 cm with a rate of 10 with a residual AHI of only 6 per hour and very acceptable leak at 19.1 for the 95th percentile.  I reviewed his compliance data from 03/06/2014 through 04/04/2014 which is a total of 30 days, during which time he uses BiPAP every night. Percent used days greater than 4 hours was 96%, indicating excellent compliance, residual AHI 5.5 per hour, very acceptable, leak acceptable at 22.2 for the 95th percentile. Settings unchanged.  His Past Medical History Is Significant For: Past Medical History  Diagnosis Date  .  Coronary artery disease     a. Low level exercise Lex MV 3/14: low risk, EF 62%, inf defect 2/2 diaph attenuation vs artifiact, small area of scar possible, no ischemia  . Hyperlipidemia   . Hypertension   . History of seizure disorder   . History of atrial flutter   . Depression   . Back pain     persistent  . Hearing loss   . Diverticulitis   . H/O alcohol abuse   . Diabetes mellitus     controled by diet  . GERD (gastroesophageal reflux disease)   . Seizures ([place])   . Headache(784.0)   . Arthritis     of spine  . Sleep apnea     does not use CPAP  . Hx of echocardiogram     Echo 7/14:  Mod LVH, EF 55-60%, Gr 1 DD, mild MR, PASP 36  . AKI (acute kidney injury) ([place])     a. 6/14: resolved after d/c ARB;  b. RA U/S 7/14: no RA stenosis  . Atrial flutter ([place])   . Stroke ([place])   . Shortness of breath   . Renal stone 04/15/2014    His Past Surgical History Is Significant For: Past Surgical History  Procedure Laterality Date  . Partial colectomy      for diverticuli  . Orchiectomy    . Umbilical hernia repair    . Cervical fusion    . Coronary artery bypass graft  2005    LIMA graft to LAD,saphenous vein graft to diag.,circumflex, marginal,and to the RCA  . Cardiac catheterization    . Appendectomy    . Esophagogastroduodenoscopy  02/10/2012    Procedure: ESOPHAGOGASTRODUODENOSCOPY (EGD);  Surgeon: Cleotis Nipper, MD;  [facility] END[facility]COPY;  Service: Endoscopy;  Laterality: N/A;  . Foreign body removal  02/10/2012    Procedure: FOREIGN BODY REMOVAL;  Surgeon: Cleotis Nipper, MD;  Location: [facility];  Service: Endoscopy;  Laterality: N/A;  . Back surgery    . Esophagogastroduodenoscopy  06/29/2012    Procedure: ESOPHAGOGASTRODUODENOSCOPY (EGD);  Surgeon: Winfield Cunas., MD;  Location: [facility] ENDOS[facility];  Service: Endoscopy;  Laterality: N/A;  . Foreign body removal  06/29/2012    Procedure: FOREIGN BODY REMOVAL;  Surgeon: Winfield Cunas., MD;  Location: [facility]  ENDOSCOPY;  Service: Endoscopy;  Laterality: N/A;  . Savory dilation  06/29/2012    Procedure: SAVORY DILATION;  Surgeon: Winfield Cunas., MD;  Location: [facility] ENDOSCOPY;  Service: Endoscopy;  Laterality: N/A;  . Tee without cardioversion N/A 11/17/2013  Procedure: TRANSESOPHAGEAL ECHOCARDIOGRAM (TEE);  Surgeon: Larey Dresser, MD;  Location: [facility];  Service: Cardiovascular;  Laterality: N/A;  . Cystoscopy/retrograde/ureteroscopy/stone extraction with basket Left 04/16/2014    Procedure: CYSTOSCOPY/RETROGRADE/LEFT URETEROSCOPY/STONE EXTRACTION WITH BASKET AND LASER LITHOTRIPSY;  Surgeon: Malka So, MD;  Location: [facility] ORS;  Service: Urology;  Laterality: Left;  With STENT  . Left heart catheterization with coronary angiogram N/A 04/02/2012    Procedure: LEFT HEART CATHETERIZATION WITH CORONARY ANGIOGRAM;  Surgeon: Sinclair Grooms, MD;  Location: [facility] CATH LAB;  Service: Cardiovascular;  Laterality: N/A;  . Loop recorder implant N/A 11/17/2013    Procedure: LOOP RECORDER IMPLANT;  Surgeon: Deboraha Sprang, MD;  Location: [facility] CATH LAB;  Service: Cardiovascular;  Laterality: N/A;    His Family History Is Significant For: Family History  Problem Relation Age of Onset  . Emphysema Mother     was a smoker  . Asthma Mother   . Heart disease Father   . Prostate cancer Paternal Grandfather   . Pancreatic cancer Paternal Uncle   . Heart attack Neg Hx   . Stroke Paternal Grandmother   . Hypertension Mother     His Social History Is Significant For: Social History   Social History  . Marital Status: Married    Spouse Name: Clinical research associate  . Number of Children: 2  . Years of Education: college   Occupational History  . 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     Social History Main Topics  . Smoking status: Never Smoker   . Smokeless tobacco: Never Used  . Alcohol Use: No     Comment: rare beer  . Drug Use: No  . Sexual Activity: Not Currently   Other Topics Concern  . None    Social History Narrative   Patient lives at home with  wife      Patient drinks 2 cups of coffee a day.patient is left handed    His Allergies Are:  Allergies  Allergen Reactions  . Cephalexin     Unknown  . Doxycycline     Unknown  . Keflex [Cephalexin]   . Lac Bovis Other (See Comments)    lactose intolerant  . Pentazocine Lactate     He passed out- he had a seizure.  This occurred around 2000  . Talwin [Pentazocine] Other (See Comments)    hallucinations  . Tape Other (See Comments)    Paper tape only please.  . Zanaflex [Tizanidine Hcl] Other (See Comments)    Lightheaded and dizzy  :   His Current Medications Are:  Outpatient Encounter Prescriptions as of 10/09/2015  Medication Sig  . aspirin EC 81 MG EC tablet Take 1 tablet (81 mg total) by mouth daily.  Marland Kitchen atorvastatin (LIPITOR) 80 MG tablet Take 1 tablet (80 mg total) by mouth daily. (Patient taking differently: Take 80 mg by mouth at bedtime. )  . ELIQUIS 5 MG TABS tablet TAKE 1 TABLET(5 MG) BY MOUTH TWICE DAILY  . fenofibrate (TRICOR) 145 MG tablet Take 1 tablet (145 mg total) by mouth daily.  Marland Kitchen FLUoxetine (PROZAC) 10 MG capsule Take 30 mg by mouth every morning.  . fluticasone (FLONASE) 50 MCG/ACT nasal spray Place 2 sprays into both nostrils daily.  . furosemide (LASIX) 20 MG tablet TAKE 1-2 TABLETS BY MOUTH EVERY DAY AS NEEDED (Patient taking differently: 40 mg DAILY in the morning (4am))  . lamoTRIgine (LAMICTAL) 100 MG tablet Take 1 tablet (100 mg total)  by mouth 2 (two) times daily.  . meclizine (ANTIVERT) 12.5 MG tablet Take 1 tablet (12.5 mg total) by mouth 2 (two) times daily as needed for dizziness.  . methocarbamol (ROBAXIN) 750 MG tablet Take 750 mg by mouth every 8 (eight) hours as needed for muscle spasms.   . metoprolol (LOPRESSOR) 50 MG tablet Take 0.5 tablets (25 mg total) by mouth 2 (two) times daily. (Patient taking differently: Take 25 mg by mouth every morning. Take 1/2 daily)  . nitroGLYCERIN  (NITROSTAT) 0.4 MG SL tablet Place 1 tablet (0.4 mg total) under the tongue every 5 (five) minutes as needed. For chest pain  . ONE TOUCH ULTRA TEST test strip USE TO TEST BLOOD SUGAR DAILY  . ONETOUCH DELICA LANCETS FINE MISC   . potassium citrate (UROCIT-K) 10 MEQ (1080 MG) SR tablet Take 10 mEq by mouth 3 (three) times daily with meals.   . sitaGLIPtin (JANUVIA) 100 MG tablet Take 100 mg by mouth every morning. Reported on 09/19/2015  . traZODone (DESYREL) 150 MG tablet Take 150 mg by mouth at bedtime.  . [DISCONTINUED] lamoTRIgine (LAMICTAL) 25 MG tablet One tab Qhs for 2 weeks, then 14m bid for 2 weeks, then 558mbid for 2 weeks, then 7594mid for 2 weeks. (Patient not taking: Reported on 09/26/2015)   No facility-administered encounter medications on file as of 10/09/2015.  :  Review of Systems:  Out of a complete 14 point review of systems, all are reviewed and negative with the exception of these symptoms as listed below:   Review of Systems  Neurological:       No new concerns per patient.     Objective:  Neurologic Exam  Physical Exam Physical Examination:   Filed Vitals:   10/09/15 0930  BP: 108/64  Pulse: 72  Resp: 20   General Examination: The patient is a very pleasant [age]o. male in no acute distress. He appears mildly deconditioned and more frail, and adequately groomed. He is situated in a chair and on O2 via [place], brought his walker.     HEENT: Normocephalic, atraumatic, pupils are equal, round and reactive to light and accommodation. Extraocular tracking is good without limitation to gaze excursion or nystagmus noted. He is status post left cataract repair. Hearing is impaired mildly, with b/l hearing aids in place. Face is symmetric with normal facial animation and normal facial sensation. Speech shows mild dysarthria. There is no hypophonia. There is no lip, neck/head, jaw or voice tremor. Neck is supple with full range of passive and active motion. There are no  carotid bruits on auscultation. Oropharynx exam reveals: moderate mouth dryness, adequate dental hygiene and marked airway crowding, due to quite narrow airway entry, and swollen and erythematous soft palate including uvula which is enlarged. Mallampati is class II. Tongue protrudes centrally and palate elevates symmetrically. Tonsils are absent.    Chest: Clear to auscultation without wheezing, rhonchi or crackles noted.  Heart: S1+S2+0, regular and normal without murmurs, rubs or gallops noted. He has a loop recorder in place.   Abdomen: Soft, non-tender and non-distended with normal bowel sounds appreciated on auscultation.  Extremities: There is non-pitting puffiness in the distal lower extremities bilaterally.   Skin: Warm and dry without trophic changes noted. There are no varicose veins.  Musculoskeletal: exam reveals no obvious joint deformities, tenderness or joint swelling or erythema.   Neurologically:  Mental status: The patient is awake, alert and oriented in all 4 spheres. His immediate and remote memory,  attention, language skills and fund of knowledge are fairly appropriate. There is no evidence of aphasia, agnosia, apraxia or anomia. Speech is mildly dysarthric. Thought process is linear but he does have some slowness in thinking and tends to turn to his wife for some of the answers. Mood is constricted and affect is normal today.  Cranial nerves II - XII are as described above under HEENT exam. In addition: shoulder shrug is normal with equal shoulder height noted. Motor exam: Normal bulk, strength and tone is noted on the R and he has unchanged 4+/5 degree of weakness in his left lower extremity with hip flexion and knee extension primarily, and perhaps a slight degree of weakness in the left upper extremity. Reflexes are about 1+ throughout. Fine motor skills are mildly impaired on the left. He does not have any overt dysmetria or intention tremor on cerebellar testing. Sensory  exam is intact in the UEs and decreased in the distal lower extremities bilaterally, largely unchanged. Gait, station and balance: He stands up slowly and walks slowly with his rolling walker. Tandem walk is not possible for him.   Assessment and Plan:  In summary, [name] is a very pleasant 70 year old male with an underlying complex medical history of heart disease, hyperlipidemia, obesity, hypertension, history of seizure disorder, history of atrial flutter, depression, chronic back pain, hearing loss, diverticulitis, history of alcohol and prescription medicine abuse in the past, diabetes, reflux disease, recurrent headaches, and recurrent strokes (first in the 03T, then embolic stroke with residual right lower extremity weakness in 2015 and more recent stroke in the R ACA territory in 6/16), and chronic respiratory failure requiring oxygen 24-7, who presents for followup consultation of his OSA, established on BiPAP ST therapy (with supplemental O2). He has been fully compliant with treatment and is encouraged to continue with treatment and is greatly commended for trying to be compliant. He does spend a lot of time in bed and uses BiPAP during all time in bed. He is advised to get out of bed more and use BiPAP only when planning to nap and when sleeping in bed at night. He has overall felt better since starting BiPAP therapy. He was started on Lamictal recently with titration. He is tolerating this well. His physical exam is largely unchanged. He is encouraged to stay better hydrated. From my end of things, he is doing well with BiPAP ST at this time. I would like to see him back in 12 months, sooner if needed for sleep apnea management. I answered all their questions today and the patient and his wife were in agreement.  I spent 20 minutes in total face-to-face time with the patient, more than 50% of which was spent in counseling and coordination of care, reviewing test results, reviewing  medication and discussing or reviewing the diagnosis of OSA, seizure, the prognosis and treatment options.

## 2015-10-09 NOTE — Patient Instructions (Addendum)
Walking Program:  Begin walking for exercise for 5 minutes, 2-3 times/day, 4-5 days/week.   Progress your walking program by adding 2-3 minutes to your routine each week, as tolerated. Be sure to wear good walking shoes, walk in a safe environment and only progress to your tolerance.     Progress your way up to 1 walk of 25 minutes each day you walk.   Walking on Toes    At counter top: Walk on toes forward and then backwards while continuing on a straight path.  Perform 2 laps down and back before resting. Do _1-2_ sessions per day.  Copyright  VHI. All rights reserved.  Walking on Heels    At counter top: Walk on heels forward and then backwards while continuing on a straight path. Perform 2 laps each way before resting. Do _1-2_ sessions per day.  Copyright  VHI. All rights reserved.  Side-Stepping    At counter top: with red band tied around legs just below the knees. Side step toward one side and the toward the other side along counter top. Keep feet pointed toward counter top.  Perform 2 laps each way before resting. 1-2 times a day.  Copyright  VHI. All rights reserved.  Side-Stepping    Walk to left side with eyes open. Take even steps, leading with same foot. Make sure each foot lifts off the floor. Repeat in opposite direction. Repeat for ____ minutes per session. Do ____ sessions per day. Repeat on compliant surface: ________.  Copyright  VHI. All rights reserved.  Figure Eight    Walk in a figure eight pattern around 2 plastic cups as targets.  Repeat 2 laps per session. Do __1-2__ sessions per day.   Copyright  VHI. All rights reserved.

## 2015-10-09 NOTE — Patient Instructions (Signed)
Use your BiPAP for naps and night time sleep only.  Try to get out of bed more.  Keep up the good work with your BiPAP. I will see you back in a year for your sleep apnea.

## 2015-10-09 NOTE — Therapy (Signed)
[facility] [address] [place], [place], [address] Phone: [phone]   Fax:  [phone]  Physical Therapy Treatment  Patient Details  Name: [name] MRN: [id number] Date of Birth: [birth year] Referring Provider: Rosalin Hawking, MD  Encounter Date: 10/09/2015      PT End of Session - 10/09/15 1537    Visit Number 4   Number of Visits 9   Date for PT Re-Evaluation 10/26/15   Authorization Type Medicare -G codes every 10th visit   PT Start Time 5170   PT Stop Time 1615   PT Time Calculation (min) 42 min   Activity Tolerance Patient tolerated treatment well   Behavior During Therapy [facility] for tasks assessed/performed      Past Medical History  Diagnosis Date  . Coronary artery disease     a. Low level exercise Lex MV 3/14: low risk, EF 62%, inf defect 2/2 diaph attenuation vs artifiact, small area of scar possible, no ischemia  . Hyperlipidemia   . Hypertension   . History of seizure disorder   . History of atrial flutter   . Depression   . Back pain     persistent  . Hearing loss   . Diverticulitis   . H/O alcohol abuse   . Diabetes mellitus     controled by diet  . GERD (gastroesophageal reflux disease)   . Seizures ([place])   . Headache(784.0)   . Arthritis     of spine  . Sleep apnea     does not use CPAP  . Hx of echocardiogram     Echo 7/14:  Mod LVH, EF 55-60%, Gr 1 DD, mild MR, PASP 36  . AKI (acute kidney injury) ([place])     a. 6/14: resolved after d/c ARB;  b. RA U/S 7/14: no RA stenosis  . Atrial flutter ([place])   . Stroke ([place])   . Shortness of breath   . Renal stone 04/15/2014    Past Surgical History  Procedure Laterality Date  . Partial colectomy      for diverticuli  . Orchiectomy    . Umbilical hernia repair    . Cervical fusion    . Coronary artery bypass graft  2005    LIMA graft to LAD,saphenous vein graft to diag.,circumflex, marginal,and to the RCA  . Cardiac catheterization     . Appendectomy    . Esophagogastroduodenoscopy  02/10/2012    Procedure: ESOPHAGOGASTRODUODENOSCOPY (EGD);  Surgeon: Cleotis Nipper, MD;  Location: [facility] ENDOSCOPY;  Service: Endoscopy;  Laterality: N/A;  . Foreign body removal  02/10/2012    Procedure: FOREIGN BODY REMOVAL;  Surgeon: Cleotis Nipper, MD;  Location: [facility];  Service: Endoscopy;  Laterality: N/A;  . Back surgery    . Esophagogastroduodenoscopy  06/29/2012    Procedure: ESOPHAGOGASTRODUODENOSCOPY (EGD);  Surgeon: Winfield Cunas., MD;  Location: [facility] ENDOSCOPY;  Service: Endoscopy;  Laterality: N/A;  . Foreign body removal  06/29/2012    Procedure: FOREIGN BODY REMOVAL;  Surgeon: Winfield Cunas., MD;  Location: [facility] ENDOSCOPY;  Service: Endoscopy;  Laterality: N/A;  . Savory dilation  06/29/2012    Procedure: SAVORY DILATION;  Surgeon: Winfield Cunas., MD;  Location: [facility] ENDOSCOPY;  Service: Endoscopy;  Laterality: N/A;  . Tee without cardioversion N/A 11/17/2013    Procedure: TRANSESOPHAGEAL ECHOCARDIOGRAM (TEE);  Surgeon: Larey Dresser, MD;  Location: [facility];  Service: Cardiovascular;  Laterality: N/A;  . Cystoscopy/retrograde/ureteroscopy/stone extraction with basket  Left 04/16/2014    Procedure: CYSTOSCOPY/RETROGRADE/LEFT URETEROSCOPY/STONE EXTRACTION WITH BASKET AND LASER LITHOTRIPSY;  Surgeon: Malka So, MD;  Location: [facility] ORS;  Service: Urology;  Laterality: Left;  With STENT  . Left heart catheterization with coronary angiogram N/A 04/02/2012    Procedure: LEFT HEART CATHETERIZATION WITH CORONARY ANGIOGRAM;  Surgeon: Sinclair Grooms, MD;  Location: [facility] CATH LAB;  Service: Cardiovascular;  Laterality: N/A;  . Loop recorder implant N/A 11/17/2013    Procedure: LOOP RECORDER IMPLANT;  Surgeon: Deboraha Sprang, MD;  [facility] CATH LAB;  Service: Cardiovascular;  Laterality: N/A;    There were no vitals filed for this visit.  Visit Diagnosis:  Generalized weakness  Unsteadiness  Decreased activity  tolerance  Poor balance  Impaired mobility and ADLs      Subjective Assessment - 10/09/15 1535    Subjective No new complaints. Reports being tired today. Denies any new falls. Reports doing HEP 1x a day.   Limitations House hold activities;Walking;Standing   Patient Stated Goals "Get as good as I can."    Currently in Pain? Yes   Pain Score 8    Pain Location Back   Pain Orientation Lower   Pain Descriptors / Indicators Aching;Sore   Pain Type Chronic pain   Pain Onset More than a month ago   Pain Frequency Constant   Aggravating Factors  nothing he can think of   Pain Relieving Factors nothing      Treatment: Pt performed current HEP with handouts and occasional cues on posture and ex form.  Pt performed new balance exercises at counter top with cues on form and technique. Added these to HEP. Refer to pt instruction section for full details  Continues to fatigue quickly, needed frequent short rest breaks.         PT Education - 10/09/15 1625    Education provided Yes   Education Details HEP: added additional balance ex's today   Person(s) Educated Patient   Methods Explanation;Demonstration;Verbal cues;Handout   Comprehension Verbalized understanding;Returned demonstration;Verbal cues required;Need further instruction             PT Long Term Goals - 10/02/15 1508    PT LONG TERM GOAL #1   Title Pt will be independent with HEP in order to indicate decreased fall risk and improved functional mobility.  (Target Date: 10/24/15)   PT LONG TERM GOAL #2   Title Pt will increase DGI score to 20/24 in order to indicate decreased fall risk.     PT LONG TERM GOAL #3   Title Will assess 6MWT and improve distance by 150' in order to indicate increase in functional endurance.    Baseline 537' on 10/02/15   PT LONG TERM GOAL #4   Title Pt will report initiation into community fitness program in order to indicate continuation of progress made in therapy.     PT LONG TERM  GOAL #5   Title Pt will increase gait speed >2.62 ft/sec in order to indicate pt is functional community ambulator.                Plan - 10/09/15 1537    Clinical Impression Statement Pt able to demo current HEP with written handouts and minimal cues on correct posture/ex form. Added additional balance exercises without any issues reported during performance in session today. Pt is making steady progress toward goals.    Pt will benefit from skilled therapeutic intervention in order to improve on the following deficits  Abnormal gait;Cardiopulmonary status limiting activity;Decreased activity tolerance;Decreased balance;Decreased cognition;Decreased endurance;Decreased mobility;Decreased strength;Impaired flexibility;Impaired perceived functional ability;Postural dysfunction;Obesity;Pain  pain is chronic and will not be addressing during this episode.   Rehab Potential Good   Clinical Impairments Affecting Rehab Potential compliance and co-morbidiities   PT Frequency 2x / week   PT Duration 4 weeks   PT Treatment/Interventions ADLs/Self Care Home Management;Gait training;Functional mobility training;DME Instruction;Stair training;Therapeutic activities;Therapeutic exercise;Balance training;Neuromuscular re-education;Cognitive remediation;Patient/family education;Energy conservation   PT Next Visit Plan continued with gait, strengthening and balance activities toward STGs.   Consulted and Agree with Plan of Care Patient;Family member/caregiver   Family Member Consulted wife        Problem List Patient Active Problem List   Diagnosis Date Noted  . Cerebrovascular accident (CVA) due to embolism of right anterior cerebral artery ([place]) 09/19/2015  . History of CVA with residual deficit 09/19/2015  . Type 2 diabetes mellitus with circulatory disorder ([place]) 07/12/2015  . HLD (hyperlipidemia) 07/12/2015  . Essential hypertension 07/12/2015  . Coronary artery disease involving coronary  bypass graft of native heart with unspecified angina pectoris 07/12/2015  . Intracranial carotid stenosis 07/12/2015  . Morbid obesity ([place]) 02/21/2015  . Acute ischemic stroke ([place])   . Dysarthria   . History of pulmonary embolism   . Stroke ([place]) 02/11/2015  . CVA (cerebral infarction) 02/11/2015  . Acute right ACA stroke ([place]) 02/11/2015  . Lower extremity weakness 01/08/2015  . Status post placement of implantable loop recorder 08/01/2014  . Chronic respiratory failure, unspecified whether with hypoxia or hypercapnia ([place]) 05/19/2014  . Pulmonary embolism ([place]) 05/19/2014  . Hemiparesis and speech and language deficit as late effects of stroke ([place]) 04/19/2014  . Ureteral colic 99/37/1696  . Left ureteral stone 04/15/2014  . Obstructive sleep apnea 12/07/2013  . Obesity, unspecified 12/07/2013  . Embolic stroke ([place]) 78/93/8101  . Cough 06/09/2013  . S/P lumbar spine operation 06/02/2013  . Nerve root pain 06/02/2013  . Pleural plaque consistent with asbestos exposure by CT 10/2012 02/17/2013  . Renal failure 02/13/2013  . Dyspnea 02/11/2013  . Dysphagia, pharyngoesophageal phase 05/12/2012  . Seizure disorder ([place]) 04/04/2012  . CAD (coronary artery disease) 06/13/2011  . HTN (hypertension) 06/13/2011  . Diabetes mellitus ([place]) 06/13/2011  . History of seizure disorder   . Depression   . Back pain   . Hearing loss   . Arrhythmia   . Diverticulitis   . H/O alcohol abuse   . Personal history of other diseases of circulatory system 05/29/2011    Willow Ora 10/09/2015, 4:27 PM  Willow Ora, PTA, [place] [address], [place], [place] [address] [phone] 10/11/2015, 1:05 PM   Name: [name] MRN: [id number] Date of Birth: [birth year]

## 2015-10-10 ENCOUNTER — Encounter: Payer: Medicare Other | Admitting: Occupational Therapy

## 2015-10-12 ENCOUNTER — Encounter: Payer: Self-pay | Admitting: Physical Therapy

## 2015-10-12 ENCOUNTER — Ambulatory Visit: Payer: Medicare Other | Admitting: Physical Therapy

## 2015-10-12 ENCOUNTER — Encounter: Payer: Medicare Other | Admitting: Occupational Therapy

## 2015-10-12 DIAGNOSIS — Z7409 Other reduced mobility: Secondary | ICD-10-CM

## 2015-10-12 DIAGNOSIS — R2681 Unsteadiness on feet: Secondary | ICD-10-CM | POA: Diagnosis not present

## 2015-10-12 DIAGNOSIS — R531 Weakness: Secondary | ICD-10-CM

## 2015-10-12 DIAGNOSIS — R2689 Other abnormalities of gait and mobility: Secondary | ICD-10-CM | POA: Diagnosis not present

## 2015-10-12 DIAGNOSIS — R6889 Other general symptoms and signs: Secondary | ICD-10-CM | POA: Diagnosis not present

## 2015-10-13 ENCOUNTER — Ambulatory Visit (INDEPENDENT_AMBULATORY_CARE_PROVIDER_SITE_OTHER): Payer: Medicare Other | Admitting: *Deleted

## 2015-10-13 DIAGNOSIS — I639 Cerebral infarction, unspecified: Secondary | ICD-10-CM | POA: Diagnosis not present

## 2015-10-13 NOTE — Progress Notes (Signed)
Carelink Summary Report / Loop Recorder 

## 2015-10-13 NOTE — Therapy (Signed)
[facility] [address] [place], [place], [address] Phone: [phone]   Fax:  [phone]  Physical Therapy Treatment  Patient Details  Name: [name] MRN: [id number] Date of Birth: September 24, 1945 Referring Provider: Rosalin Hawking, MD  Encounter Date: 10/12/2015      PT End of Session - 10/12/15 1454    Visit Number 5   Number of Visits 9   Date for PT Re-Evaluation 10/26/15   Authorization Type Medicare -G codes every 10th visit   PT Start Time 1448   PT Stop Time 1530   PT Time Calculation (min) 42 min   Equipment Utilized During Treatment Gait belt   Activity Tolerance Patient tolerated treatment well   Behavior During Therapy [facility] for tasks assessed/performed      Past Medical History  Diagnosis Date  . Coronary artery disease     a. Low level exercise Lex MV 3/14: low risk, EF 62%, inf defect 2/2 diaph attenuation vs artifiact, small area of scar possible, no ischemia  . Hyperlipidemia   . Hypertension   . History of seizure disorder   . History of atrial flutter   . Depression   . Back pain     persistent  . Hearing loss   . Diverticulitis   . H/O alcohol abuse   . Diabetes mellitus     controled by diet  . GERD (gastroesophageal reflux disease)   . Seizures ([place])   . Headache(784.0)   . Arthritis     of spine  . Sleep apnea     does not use CPAP  . Hx of echocardiogram     Echo 7/14:  Mod LVH, EF 55-60%, Gr 1 DD, mild MR, PASP 36  . AKI (acute kidney injury) ([place])     a. 6/14: resolved after d/c ARB;  b. RA U/S 7/14: no RA stenosis  . Atrial flutter ([place])   . Stroke ([place])   . Shortness of breath   . Renal stone 04/15/2014    Past Surgical History  Procedure Laterality Date  . Partial colectomy      for diverticuli  . Orchiectomy    . Umbilical hernia repair    . Cervical fusion    . Coronary artery bypass graft  2005    LIMA graft to LAD,saphenous vein graft to diag.,circumflex,  marginal,and to the RCA  . Cardiac catheterization    . Appendectomy    . Esophagogastroduodenoscopy  02/10/2012    Procedure: ESOPHAGOGASTRODUODENOSCOPY (EGD);  Surgeon: Cleotis Nipper, MD;  Location: [facility] ENDOSCOPY;  Service: Endoscopy;  Laterality: N/A;  . Foreign body removal  02/10/2012    Procedure: FOREIGN BODY REMOVAL;  Surgeon: Cleotis Nipper, MD;  Location: [facility] [place];  Service: Endoscopy;  Laterality: N/A;  . Back surgery    . Esophagogastroduodenoscopy  06/29/2012    Procedure: ESOPHAGOGASTRODUODENOSCOPY (EGD);  Surgeon: Winfield Cunas., MD;  Location: [facility] ENDOSCOPY;  Service: Endoscopy;  Laterality: N/A;  . Foreign body removal  06/29/2012    Procedure: FOREIGN BODY REMOVAL;  Surgeon: Winfield Cunas., MD;  Location: [facility] ENDOSCOPY;  Service: Endoscopy;  Laterality: N/A;  . Savory dilation  06/29/2012    Procedure: SAVORY DILATION;  Surgeon: Winfield Cunas., MD;  Location: [facility] ENDOSCOPY;  Service: Endoscopy;  Laterality: N/A;  . Tee without cardioversion N/A 11/17/2013    Procedure: TRANSESOPHAGEAL ECHOCARDIOGRAM (TEE);  Surgeon: Larey Dresser, MD;  Location: [facility];  Service: Cardiovascular;  Laterality: N/A;  . Cystoscopy/retrograde/ureteroscopy/stone extraction with basket Left 04/16/2014    Procedure: CYSTOSCOPY/RETROGRADE/LEFT URETEROSCOPY/STONE EXTRACTION WITH BASKET AND LASER LITHOTRIPSY;  Surgeon: Malka So, MD;  Location: [facility];  Service: Urology;  Laterality: Left;  With STENT  . Left heart catheterization with coronary angiogram N/A 04/02/2012    Procedure: LEFT HEART CATHETERIZATION WITH CORONARY ANGIOGRAM;  Surgeon: Sinclair Grooms, MD;  Location: [facility] CATH LAB;  Service: Cardiovascular;  Laterality: N/A;  . Loop recorder implant N/A 11/17/2013    Procedure: LOOP RECORDER IMPLANT;  Surgeon: Deboraha Sprang, MD;  Location: [facility] CATH LAB;  Service: Cardiovascular;  Laterality: N/A;    There were no vitals filed for this visit.  Visit Diagnosis:  Generalized  weakness  Unsteadiness  Decreased activity tolerance  Poor balance  Impaired mobility and ADLs      Subjective Assessment - 10/12/15 1452    Subjective Reports a fall on Tuesday night. He was going to sit down in a straight back chair and it slid back, out from under him. Spouse and grandson's girl friend helped him back up. Denies any injury or hitting of head.   Limitations House hold activities;Walking;Standing   Patient Stated Goals "Get as good as I can."    Currently in Pain? Yes   Pain Score 8    Pain Location Back   Pain Orientation Lower   Pain Descriptors / Indicators Aching;Sore   Pain Type Chronic pain   Pain Onset More than a month ago   Pain Frequency Constant   Aggravating Factors  riding more than an hour in a car   Pain Relieving Factors pain pills             [facility] Adult PT Treatment/Exercise - 10/12/15 1455    Ambulation/Gait   Ambulation/Gait Yes   Ambulation/Gait Assistance 5: Supervision   Ambulation/Gait Assistance Details 95% on 3 lpm oxygen before and after gait.   Ambulation Distance (Feet) 420 Feet   Assistive device Rollator   Gait Pattern Step-through pattern;Wide base of support   Ambulation Surface Level;Indoor   Knee/Hip Exercises: Standing   Heel Raises Both;10 reps;1 set;Limitations  heel/toe raises   Heel Raises Limitations rollator support, cues for posture and to breath as well   Knee Flexion AROM;Strengthening;Both;1 set;10 reps  high knee marching   Knee Flexion Limitations rollator support, cues on posture and ex form   Hip Abduction AROM;Stengthening;Both;1 set;10 reps;Limitations;Knee straight   Abduction Limitations rollator support, cues on posture. alternated legs.    Forward Step Up Both;5 reps;2 sets;Hand Hold: 2;Step Height: 4";Limitations   Forward Step Up Limitations rollator support, cues on form and technique   Functional Squat 1 set;10 reps;Limitations  mini squats   Functional Squat Limitations with rollator,  cues on form and technique.             PT Long Term Goals - 10/02/15 1508    PT LONG TERM GOAL #1   Title Pt will be independent with HEP in order to indicate decreased fall risk and improved functional mobility.  (Target Date: 10/24/15)   PT LONG TERM GOAL #2   Title Pt will increase DGI score to 20/24 in order to indicate decreased fall risk.     PT LONG TERM GOAL #3   Title Will assess 6MWT and improve distance by 150' in order to indicate increase in functional endurance.    Baseline 537' on 10/02/15   PT LONG TERM GOAL #4   Title Pt  will report initiation into community fitness program in order to indicate continuation of progress made in therapy.     PT LONG TERM GOAL #5   Title Pt will increase gait speed >2.62 ft/sec in order to indicate pt is functional community ambulator.             Plan - 10/12/15 1455    Clinical Impression Statement Skilled session continued to focus on gait and strengthening exercises. Pt continues to fatigue quickly, needed short rest breaks. Pt is making steady progress toward goals.   Pt will benefit from skilled therapeutic intervention in order to improve on the following deficits Abnormal gait;Cardiopulmonary status limiting activity;Decreased activity tolerance;Decreased balance;Decreased cognition;Decreased endurance;Decreased mobility;Decreased strength;Impaired flexibility;Impaired perceived functional ability;Postural dysfunction;Obesity;Pain  pain is chronic and will not be addressing during this episode.   Rehab Potential Good   Clinical Impairments Affecting Rehab Potential compliance and co-morbidiities   PT Frequency 2x / week   PT Duration 4 weeks   PT Treatment/Interventions ADLs/Self Care Home Management;Gait training;Functional mobility training;DME Instruction;Stair training;Therapeutic activities;Therapeutic exercise;Balance training;Neuromuscular re-education;Cognitive remediation;Patient/family education;Energy conservation    PT Next Visit Plan continued with gait, strengthening and balance activities toward LTGs.   Consulted and Agree with Plan of Care Patient;Family member/caregiver   Family Member Consulted wife        Problem List Patient Active Problem List   Diagnosis Date Noted  . Cerebrovascular accident (CVA) due to embolism of right anterior cerebral artery ([place]) 09/19/2015  . History of CVA with residual deficit 09/19/2015  . Type 2 diabetes mellitus with circulatory disorder ([place]) 07/12/2015  . HLD (hyperlipidemia) 07/12/2015  . Essential hypertension 07/12/2015  . Coronary artery disease involving coronary bypass graft of native heart with unspecified angina pectoris 07/12/2015  . Intracranial carotid stenosis 07/12/2015  . Morbid obesity ([place]) 02/21/2015  . Acute ischemic stroke ([place])   . Dysarthria   . History of pulmonary embolism   . Stroke ([place]) 02/11/2015  . CVA (cerebral infarction) 02/11/2015  . Acute right ACA stroke ([place]) 02/11/2015  . Lower extremity weakness 01/08/2015  . Status post placement of implantable loop recorder 08/01/2014  . Chronic respiratory failure, unspecified whether with hypoxia or hypercapnia ([place]) 05/19/2014  . Pulmonary embolism ([place]) 05/19/2014  . Hemiparesis and speech and language deficit as late effects of stroke ([place]) 04/19/2014  . Ureteral colic 58/30/9407  . Left ureteral stone 04/15/2014  . Obstructive sleep apnea 12/07/2013  . Obesity, unspecified 12/07/2013  . Embolic stroke ([place]) 68/03/8109  . Cough 06/09/2013  . S/P lumbar spine operation 06/02/2013  . Nerve root pain 06/02/2013  . Pleural plaque consistent with asbestos exposure by CT 10/2012 02/17/2013  . Renal failure 02/13/2013  . Dyspnea 02/11/2013  . Dysphagia, pharyngoesophageal phase 05/12/2012  . Seizure disorder ([place]) 04/04/2012  . CAD (coronary artery disease) 06/13/2011  . HTN (hypertension) 06/13/2011  . Diabetes mellitus ([place]) 06/13/2011  . History of seizure disorder   .  Depression   . Back pain   . Hearing loss   . Arrhythmia   . Diverticulitis   . H/O alcohol abuse   . Personal history of other diseases of circulatory system 05/29/2011    Willow Ora 10/13/2015, 11:30 AM  Willow Ora, PTA, [place] [address], [place], [place] [address] [phone] 10/13/2015, 11:30 AM   Name: [name] MRN: [id number] Date of Birth: August 03, 1946

## 2015-10-16 ENCOUNTER — Encounter: Payer: Self-pay | Admitting: Physical Therapy

## 2015-10-16 ENCOUNTER — Ambulatory Visit: Payer: Medicare Other | Admitting: Physical Therapy

## 2015-10-16 ENCOUNTER — Encounter: Payer: Medicare Other | Admitting: Occupational Therapy

## 2015-10-16 DIAGNOSIS — R2689 Other abnormalities of gait and mobility: Secondary | ICD-10-CM

## 2015-10-16 DIAGNOSIS — R6889 Other general symptoms and signs: Secondary | ICD-10-CM

## 2015-10-16 DIAGNOSIS — R2681 Unsteadiness on feet: Secondary | ICD-10-CM | POA: Diagnosis not present

## 2015-10-16 DIAGNOSIS — R531 Weakness: Secondary | ICD-10-CM

## 2015-10-16 DIAGNOSIS — Z7409 Other reduced mobility: Secondary | ICD-10-CM

## 2015-10-16 NOTE — Therapy (Signed)
[facility] [address] [place], [place], [address] Phone: [phone]   Fax:  [phone]  Physical Therapy Treatment  Patient Details  Name: [name] MRN: [id number] Date of Birth: 18-Oct-1945 Referring Provider: Rosalin Hawking, MD  Encounter Date: 10/16/2015      PT End of Session - 10/16/15 1408    Visit Number 6   Number of Visits 9   Date for PT Re-Evaluation 10/26/15   Authorization Type Medicare -G codes every 10th visit   PT Start Time 1402   PT Stop Time 1444   PT Time Calculation (min) 42 min   Equipment Utilized During Treatment Gait belt   Activity Tolerance Patient tolerated treatment well   Behavior During Therapy [facility] for tasks assessed/performed      Past Medical History  Diagnosis Date  . Coronary artery disease     a. Low level exercise Lex MV 3/14: low risk, EF 62%, inf defect 2/2 diaph attenuation vs artifiact, small area of scar possible, no ischemia  . Hyperlipidemia   . Hypertension   . History of seizure disorder   . History of atrial flutter   . Depression   . Back pain     persistent  . Hearing loss   . Diverticulitis   . H/O alcohol abuse   . Diabetes mellitus     controled by diet  . GERD (gastroesophageal reflux disease)   . Seizures ([place])   . Headache(784.0)   . Arthritis     of spine  . Sleep apnea     does not use CPAP  . Hx of echocardiogram     Echo 7/14:  Mod LVH, EF 55-60%, Gr 1 DD, mild MR, PASP 36  . AKI (acute kidney injury) ([place])     a. 6/14: resolved after d/c ARB;  b. RA U/S 7/14: no RA stenosis  . Atrial flutter ([place])   . Stroke ([place])   . Shortness of breath   . Renal stone 04/15/2014    Past Surgical History  Procedure Laterality Date  . Partial colectomy      for diverticuli  . Orchiectomy    . Umbilical hernia repair    . Cervical fusion    . Coronary artery bypass graft  2005    LIMA graft to LAD,saphenous vein graft to diag.,circumflex,  marginal,and to the RCA  . Cardiac catheterization    . Appendectomy    . Esophagogastroduodenoscopy  02/10/2012    Procedure: ESOPHAGOGASTRODUODENOSCOPY (EGD);  Surgeon: Cleotis Nipper, MD;  Location: [facility] ENDOSCOPY;  Service: Endoscopy;  Laterality: N/A;  . Foreign body removal  02/10/2012    Procedure: FOREIGN BODY REMOVAL;  Surgeon: Cleotis Nipper, MD;  Location: [place];  Service: Endoscopy;  Laterality: N/A;  . Back surgery    . Esophagogastroduodenoscopy  06/29/2012    Procedure: ESOPHAGOGASTRODUODENOSCOPY (EGD);  Surgeon: Winfield Cunas., MD;  Location: [facility];  Service: Endoscopy;  Laterality: N/A;  . Foreign body removal  06/29/2012    Procedure: FOREIGN BODY REMOVAL;  Surgeon: Winfield Cunas., MD;  Location: [facility] ENDOSCOPY;  Service: Endoscopy;  Laterality: N/A;  . Savory dilation  06/29/2012    Procedure: SAVORY DILATION;  Surgeon: Winfield Cunas., MD;  Location: [facility]OS[facility];  Service: Endoscopy;  Laterality: N/A;  . Tee without cardioversion N/A 11/17/2013    Procedure: TRANSESOPHAGEAL ECHOCARDIOGRAM (TEE);  Surgeon: Larey Dresser, MD;  Location: [facility];  Service: Cardiovascular;  Laterality: N/A;  . Cystoscopy/retrograde/ureteroscopy/stone extraction with basket Left 04/16/2014    Procedure: CYSTOSCOPY/RETROGRADE/LEFT URETEROSCOPY/STONE EXTRACTION WITH BASKET AND LASER LITHOTRIPSY;  Surgeon: Malka So, MD;  Location: [facility] ORS;  Service: Urology;  Laterality: Left;  With STENT  . Left heart catheterization with coronary angiogram N/A 04/02/2012    Procedure: LEFT HEART CATHETERIZATION WITH CORONARY ANGIOGRAM;  Surgeon: Sinclair Grooms, MD;  Location: [facility] CATH LAB;  Service: Cardiovascular;  Laterality: N/A;  . Loop recorder implant N/A 11/17/2013    Procedure: LOOP RECORDER IMPLANT;  Surgeon: Deboraha Sprang, MD;  Location: [facility] CATH LAB;  Service: Cardiovascular;  Laterality: N/A;    There were no vitals filed for this visit.  Visit Diagnosis:  Generalized  weakness  Unsteadiness  Decreased activity tolerance  Poor balance  Impaired mobility and ADLs      Subjective Assessment - 10/16/15 1406    Subjective No new falls. No new issues. Reports the HEP is going well, except for the walking program (gets tired quickly).    Limitations House hold activities;Walking;Standing   Patient Stated Goals "Get as good as I can."    Currently in Pain? Yes   Pain Score 8    Pain Location Back   Pain Orientation Lower   Pain Descriptors / Indicators Aching;Sore   Pain Type Chronic pain   Pain Onset More than a month ago   Pain Frequency Constant   Aggravating Factors  riding in car more than an hour   Pain Relieving Factors pain pills              [facility] Adult PT Treatment/Exercise - 10/16/15 1408    Ambulation/Gait   Ambulation/Gait Yes   Ambulation/Gait Assistance 5: Supervision   Ambulation/Gait Assistance Details 96% on 3 lpm oxygen before gait, 92% afterwards. Dyspnea 3/4 afterwards as well.    Ambulation Distance (Feet) 440 Feet   Assistive device Rollator   Gait Pattern Step-through pattern;Wide base of support   Ambulation Surface Level;Indoor   High Level Balance   High Level Balance Activities Side stepping;Tandem walking  tandem fwd/bwd   High Level Balance Comments in parallel bars with limited to no UE support x 3 laps each/each way with min guard assist   Knee/Hip Exercises: Standing   Heel Raises Both;10 reps;1 set;Limitations  heel/toe raises   Heel Raises Limitations rollator support, cues for posture and to breath as well   Knee Flexion AROM;Strengthening;Both;1 set;10 reps  high knee marching   Knee Flexion Limitations rollator support, cues on posture and ex form           Balance Exercises - 10/16/15 1600    Balance Exercises: Standing   SLS with Vectors Solid surface;Foam/compliant surface;Other reps (comment);Limitations   Other Standing Exercises standing on airex with feet together: alternating UE  raises x 10 reps, bil simultaneous UE raises x 10 reps with min guard to min assist for balance   Balance Exercises: Standing   SLS with Vectors Limitations tall cones on floor, then on blue mat: alternating fwd toe taps, cross toe taps x 10 each bil legs on both surfaces with limited to no UE support and up to min assist for balance.                                            PT Long Term Goals - 10/02/15 4235  PT LONG TERM GOAL #1   Title Pt will be independent with HEP in order to indicate decreased fall risk and improved functional mobility.  (Target Date: 10/24/15)   PT LONG TERM GOAL #2   Title Pt will increase DGI score to 20/24 in order to indicate decreased fall risk.     PT LONG TERM GOAL #3   Title Will assess 6MWT and improve distance by 150' in order to indicate increase in functional endurance.    Baseline 537' on 10/02/15   PT LONG TERM GOAL #4   Title Pt will report initiation into community fitness program in order to indicate continuation of progress made in therapy.     PT LONG TERM GOAL #5   Title Pt will increase gait speed >2.62 ft/sec in order to indicate pt is functional community ambulator.             Plan - 10/16/15 1604    Clinical Impression Statement Skilled session focused on strengthening and balance with no issues reported today. Pt continues to fatigue quickliy, needing short seated rest breaks. Pt is making steady progress toward goals.   Pt will benefit from skilled therapeutic intervention in order to improve on the following deficits Abnormal gait;Cardiopulmonary status limiting activity;Decreased activity tolerance;Decreased balance;Decreased cognition;Decreased endurance;Decreased mobility;Decreased strength;Impaired flexibility;Impaired perceived functional ability;Postural dysfunction;Obesity;Pain  pain is chronic and will not be addressing during this episode.   Rehab Potential Good   Clinical Impairments Affecting Rehab Potential compliance  and co-morbidiities   PT Frequency 2x / week   PT Duration 4 weeks   PT Treatment/Interventions ADLs/Self Care Home Management;Gait training;Functional mobility training;DME Instruction;Stair training;Therapeutic activities;Therapeutic exercise;Balance training;Neuromuscular re-education;Cognitive remediation;Patient/family education;Energy conservation   PT Next Visit Plan continued with gait, strengthening and balance activities toward LTGs.   Consulted and Agree with Plan of Care Patient        Problem List Patient Active Problem List   Diagnosis Date Noted  . Cerebrovascular accident (CVA) due to embolism of right anterior cerebral artery ([place]) 09/19/2015  . History of CVA with residual deficit 09/19/2015  . Type 2 diabetes mellitus with circulatory disorder ([place]) 07/12/2015  . HLD (hyperlipidemia) 07/12/2015  . Essential hypertension 07/12/2015  . Coronary artery disease involving coronary bypass graft of native heart with unspecified angina pectoris 07/12/2015  . Intracranial carotid stenosis 07/12/2015  . Morbid obesity ([place]) 02/21/2015  . Acute ischemic stroke ([place])   . Dysarthria   . History of pulmonary embolism   . Stroke ([place]) 02/11/2015  . CVA (cerebral infarction) 02/11/2015  . Acute right ACA stroke ([place]) 02/11/2015  . Lower extremity weakness 01/08/2015  . Status post placement of implantable loop recorder 08/01/2014  . Chronic respiratory failure, unspecified whether with hypoxia or hypercapnia ([place]) 05/19/2014  . Pulmonary embolism ([place]) 05/19/2014  . Hemiparesis and speech and language deficit as late effects of stroke ([place]) 04/19/2014  . Ureteral colic 22/09/5425  . Left ureteral stone 04/15/2014  . Obstructive sleep apnea 12/07/2013  . Obesity, unspecified 12/07/2013  . Embolic stroke ([place]) 02/16/7627  . Cough 06/09/2013  . S/P lumbar spine operation 06/02/2013  . Nerve root pain 06/02/2013  . Pleural plaque consistent with asbestos exposure by CT 10/2012  02/17/2013  . Renal failure 02/13/2013  . Dyspnea 02/11/2013  . Dysphagia, pharyngoesophageal phase 05/12/2012  . Seizure disorder ([place]) 04/04/2012  . CAD (coronary artery disease) 06/13/2011  . HTN (hypertension) 06/13/2011  . Diabetes mellitus ([place]) 06/13/2011  . History of seizure disorder   .  Depression   . Back pain   . Hearing loss   . Arrhythmia   . Diverticulitis   . H/O alcohol abuse   . Personal history of other diseases of circulatory system 05/29/2011    Willow Ora 10/16/2015, 4:06 PM  Willow Ora, PTA, [place] [address], [place], [place] [address] [phone] 10/16/2015, 4:07 PM   Name: DAN SCEARCE MRN: [id number] Date of Birth: Jun 15, 1946

## 2015-10-18 ENCOUNTER — Ambulatory Visit: Payer: Medicare Other | Admitting: Physical Therapy

## 2015-10-18 ENCOUNTER — Encounter: Payer: Medicare Other | Admitting: Occupational Therapy

## 2015-10-18 ENCOUNTER — Encounter: Payer: Self-pay | Admitting: Physical Therapy

## 2015-10-18 DIAGNOSIS — R2681 Unsteadiness on feet: Secondary | ICD-10-CM | POA: Diagnosis not present

## 2015-10-18 DIAGNOSIS — Z7409 Other reduced mobility: Secondary | ICD-10-CM | POA: Diagnosis not present

## 2015-10-18 DIAGNOSIS — R531 Weakness: Secondary | ICD-10-CM | POA: Diagnosis not present

## 2015-10-18 DIAGNOSIS — R2689 Other abnormalities of gait and mobility: Secondary | ICD-10-CM

## 2015-10-18 DIAGNOSIS — R6889 Other general symptoms and signs: Secondary | ICD-10-CM

## 2015-10-18 NOTE — Therapy (Signed)
[facility] [address] [place], [place], [address] Phone: [phone]   Fax:  [phone]  Physical Therapy Treatment  Patient Details  Name: [name] MRN: [id number] Date of Birth: 07/13/1946 Referring Provider: Rosalin Hawking, MD  Encounter Date: 10/18/2015      PT End of Session - 10/18/15 4239    Visit Number 7   Number of Visits 9   Date for PT Re-Evaluation 10/26/15   Authorization Type Medicare -G codes every 10th visit   PT Start Time 1520   PT Stop Time 1602   PT Time Calculation (min) 42 min   Equipment Utilized During Treatment Gait belt;Oxygen   Activity Tolerance Patient tolerated treatment well;Patient limited by fatigue   Behavior During Therapy [facility] for tasks assessed/performed      Past Medical History  Diagnosis Date  . Coronary artery disease     a. Low level exercise Lex MV 3/14: low risk, EF 62%, inf defect 2/2 diaph attenuation vs artifiact, small area of scar possible, no ischemia  . Hyperlipidemia   . Hypertension   . History of seizure disorder   . History of atrial flutter   . Depression   . Back pain     persistent  . Hearing loss   . Diverticulitis   . H/O alcohol abuse   . Diabetes mellitus     controled by diet  . GERD (gastroesophageal reflux disease)   . Seizures ([place])   . Headache(784.0)   . Arthritis     of spine  . Sleep apnea     does not use CPAP  . Hx of echocardiogram     Echo 7/14:  Mod LVH, EF 55-60%, Gr 1 DD, mild MR, PASP 36  . AKI (acute kidney injury) ([place])     a. 6/14: resolved after d/c ARB;  b. RA U/S 7/14: no RA stenosis  . Atrial flutter ([place])   . Stroke ([place])   . Shortness of breath   . Renal stone 04/15/2014    Past Surgical History  Procedure Laterality Date  . Partial colectomy      for diverticuli  . Orchiectomy    . Umbilical hernia repair    . Cervical fusion    . Coronary artery bypass graft  2005    LIMA graft to LAD,saphenous  vein graft to diag.,circumflex, marginal,and to the RCA  . Cardiac catheterization    . Appendectomy    . Esophagogastroduodenoscopy  02/10/2012    Procedure: ESOPHAGOGASTRODUODENOSCOPY (EGD);  Surgeon: Cleotis Nipper, MD;  Location: [facility] ENDOSCOPY;  Service: Endoscopy;  Laterality: N/A;  . Foreign body removal  02/10/2012    Procedure: FOREIGN BODY REMOVAL;  Surgeon: Cleotis Nipper, MD;  Location: [place];  Service: Endoscopy;  Laterality: N/A;  . Back surgery    . Esophagogastroduodenoscopy  06/29/2012    Procedure: ESOPHAGOGASTRODUODENOSCOPY (EGD);  Surgeon: Winfield Cunas., MD;  Location: [facility];  Service: Endoscopy;  Laterality: N/A;  . Foreign body removal  06/29/2012    Procedure: FOREIGN BODY REMOVAL;  Surgeon: Winfield Cunas., MD;  Location: [facility] ENDOSCOPY;  Service: Endoscopy;  Laterality: N/A;  . Savory dilation  06/29/2012    Procedure: SAVORY DILATION;  Surgeon: Winfield Cunas., MD;  Location: [facility] ENDOSCOPY;  Service: Endoscopy;  Laterality: N/A;  . Tee without cardioversion N/A 11/17/2013    Procedure: TRANSESOPHAGEAL ECHOCARDIOGRAM (TEE);  Surgeon: Larey Dresser, MD;  Location: [place];  Service: Cardiovascular;  Laterality: N/A;  . Cystoscopy/retrograde/ureteroscopy/stone extraction with basket Left 04/16/2014    Procedure: CYSTOSCOPY/RETROGRADE/LEFT URETEROSCOPY/STONE EXTRACTION WITH BASKET AND LASER LITHOTRIPSY;  Surgeon: Malka So, MD;  Location: [facility] ORS;  Service: Urology;  Laterality: Left;  With STENT  . Left heart catheterization with coronary angiogram N/A 04/02/2012    Procedure: LEFT HEART CATHETERIZATION WITH CORONARY ANGIOGRAM;  Surgeon: Sinclair Grooms, MD;  Location: [facility] CATH LAB;  Service: Cardiovascular;  Laterality: N/A;  . Loop recorder implant N/A 11/17/2013    Procedure: LOOP RECORDER IMPLANT;  Surgeon: Deboraha Sprang, MD;  Location: [facility] CATH LAB;  Service: Cardiovascular;  Laterality: N/A;    There were no vitals filed for this  visit.  Visit Diagnosis:  Unsteadiness  Decreased activity tolerance  Poor balance      Subjective Assessment - 10/18/15 1522    Subjective No new falls. No new issues. Reports the HEP is going well, except for the walking program (gets tired quickly).    Limitations House hold activities;Walking;Standing   Patient Stated Goals "Get as good as I can."    Currently in Pain? Yes   Pain Score 6    Pain Location Back   Pain Orientation Lower   Pain Descriptors / Indicators Constant   Pain Onset More than a month ago   Aggravating Factors  "Doing too much"   Pain Relieving Factors Resting/Laying down          [facility] Adult PT Treatment/Exercise - 10/18/15 1603    Neuro Re-ed    Neuro Re-ed Details  Parallel bars   Ambulation   Ambulation/Gait Assistance Details Other (comment)  Verbal cues for increased heel strike and step length.      NMR: // Glennie Hawk:  Beanbag toss x 2 rounds on Airex pad with patient reaching left/right outside BOS to retrieve beanbag and bending to retrieve in second trial. "Boxing" activity with patient hitting moving target while on Airex pad. Rockerboard vertical and horizontal with patient standing at >/= 15 seconds with no UE support to decrease fall risk and increase balance necessary for community ambulation. Min/guard to minA for safety with verbal cues for safe foot placement on pad/board for all trials. Patient required seated rest breaks between trials due to fatigue.  Ambulation: Gait training on indoor, level surfaces for 210 x 2 reps with supervision with verbal cues for increased heel strike and step length with focus on increased distance due to decreased energy needed with gait once deviations corrected.        PT Long Term Goals - 10/02/15 1508    PT LONG TERM GOAL #1   Title Pt will be independent with HEP in order to indicate decreased fall risk and improved functional mobility.  (Target Date: 10/24/15)   PT LONG TERM GOAL #2   Title Pt will  increase DGI score to 20/24 in order to indicate decreased fall risk.     PT LONG TERM GOAL #3   Title Will assess 6MWT and improve distance by 150' in order to indicate increase in functional endurance.    Baseline 537' on 10/02/15   PT LONG TERM GOAL #4   Title Pt will report initiation into community fitness program in order to indicate continuation of progress made in therapy.     PT LONG TERM GOAL #5   Title Pt will increase gait speed >2.62 ft/sec in order to indicate pt is functional community ambulator.  Plan - 10/18/15 1605    Clinical Impression Statement Skilled session addressing gait and balance with focus on improving activity tolerance. Patient able to ambulate longer distances and for more trials than previous sessions. Patient becoming more comfortable with challenging balance and is attempting more difficult tasks. Patient is making steady progress toward goals.   Pt will benefit from skilled therapeutic intervention in order to improve on the following deficits Abnormal gait;Cardiopulmonary status limiting activity;Decreased activity tolerance;Decreased balance;Decreased cognition;Decreased endurance;Decreased mobility;Decreased strength;Impaired flexibility;Impaired perceived functional ability;Postural dysfunction;Obesity;Pain  pain is chronic and will not be addressing during this episode.   Rehab Potential Good   Clinical Impairments Affecting Rehab Potential compliance and co-morbidiities   PT Frequency 2x / week   PT Duration 4 weeks   PT Treatment/Interventions ADLs/Self Care Home Management;Gait training;Functional mobility training;DME Instruction;Stair training;Therapeutic activities;Therapeutic exercise;Balance training;Neuromuscular re-education;Cognitive remediation;Patient/family education;Energy conservation   PT Next Visit Plan continued with gait, strengthening and balance activities toward LTGs.   Consulted and Agree with Plan of Care Patient         Problem List Patient Active Problem List   Diagnosis Date Noted  . Cerebrovascular accident (CVA) due to embolism of right anterior cerebral artery ([place]) 09/19/2015  . History of CVA with residual deficit 09/19/2015  . Type 2 diabetes mellitus with circulatory disorder ([place]) 07/12/2015  . HLD (hyperlipidemia) 07/12/2015  . Essential hypertension 07/12/2015  . Coronary artery disease involving coronary bypass graft of native heart with unspecified angina pectoris 07/12/2015  . Intracranial carotid stenosis 07/12/2015  . Morbid obesity ([place]) 02/21/2015  . Acute ischemic stroke ([place])   . Dysarthria   . History of pulmonary embolism   . Stroke ([place]) 02/11/2015  . CVA (cerebral infarction) 02/11/2015  . Acute right ACA stroke ([place]) 02/11/2015  . Lower extremity weakness 01/08/2015  . Status post placement of implantable loop recorder 08/01/2014  . Chronic respiratory failure, unspecified whether with hypoxia or hypercapnia ([place]) 05/19/2014  . Pulmonary embolism ([place]) 05/19/2014  . Hemiparesis and speech and language deficit as late effects of stroke ([place]) 04/19/2014  . Ureteral colic 49/17/9150  . Left ureteral stone 04/15/2014  . Obstructive sleep apnea 12/07/2013  . Obesity, unspecified 12/07/2013  . Embolic stroke ([place]) 56/97/9480  . Cough 06/09/2013  . S/P lumbar spine operation 06/02/2013  . Nerve root pain 06/02/2013  . Pleural plaque consistent with asbestos exposure by CT 10/2012 02/17/2013  . Renal failure 02/13/2013  . Dyspnea 02/11/2013  . Dysphagia, pharyngoesophageal phase 05/12/2012  . Seizure disorder ([place]) 04/04/2012  . CAD (coronary artery disease) 06/13/2011  . HTN (hypertension) 06/13/2011  . Diabetes mellitus ([place]) 06/13/2011  . History of seizure disorder   . Depression   . Back pain   . Hearing loss   . Arrhythmia   . Diverticulitis   . H/O alcohol abuse   . Personal history of other diseases of circulatory system 05/29/2011    Bayard Beaver,  SPTA 10/18/2015, 4:30 PM  [facility] [address] [place], [place], [address] Phone: [phone]   Fax:  [phone]  Name: [name] MRN: [id number] Date of Birth: 03-12-1946  This note has been reviewed and edited by supervising CI.  Willow Ora, PTA, [facility] [address], [place], [place] [address] [phone] 10/18/2015, 4:36 PM

## 2015-10-20 ENCOUNTER — Encounter: Payer: Self-pay | Admitting: Cardiology

## 2015-10-23 ENCOUNTER — Encounter: Payer: Self-pay | Admitting: Physical Therapy

## 2015-10-23 ENCOUNTER — Ambulatory Visit: Payer: Medicare Other | Admitting: Physical Therapy

## 2015-10-23 ENCOUNTER — Encounter: Payer: Medicare Other | Admitting: Occupational Therapy

## 2015-10-23 DIAGNOSIS — R6889 Other general symptoms and signs: Secondary | ICD-10-CM | POA: Diagnosis not present

## 2015-10-23 DIAGNOSIS — R531 Weakness: Secondary | ICD-10-CM

## 2015-10-23 DIAGNOSIS — R2681 Unsteadiness on feet: Secondary | ICD-10-CM

## 2015-10-23 DIAGNOSIS — Z7409 Other reduced mobility: Secondary | ICD-10-CM | POA: Diagnosis not present

## 2015-10-23 DIAGNOSIS — R2689 Other abnormalities of gait and mobility: Secondary | ICD-10-CM | POA: Diagnosis not present

## 2015-10-23 NOTE — Therapy (Signed)
[facility] [address] [place], [place], [address] Phone: [phone]   Fax:  [phone]  Physical Therapy Treatment  Patient Details  Name: [name] MRN: [id number] Date of Birth: Dec 30, 1945 Referring Provider: Rosalin Hawking, MD  Encounter Date: 10/23/2015      PT End of Session - 10/23/15 1150    Visit Number 8   Number of Visits 9   Date for PT Re-Evaluation 10/26/15   Authorization Type Medicare -G codes every 10th visit   PT Start Time 1057   PT Stop Time 1143   PT Time Calculation (min) 46 min   Equipment Utilized During Treatment Gait belt;Oxygen   Activity Tolerance Patient tolerated treatment well;Patient limited by fatigue   Behavior During Therapy [facility] for tasks assessed/performed      Past Medical History  Diagnosis Date  . Coronary artery disease     a. Low level exercise Lex MV 3/14: low risk, EF 62%, inf defect 2/2 diaph attenuation vs artifiact, small area of scar possible, no ischemia  . Hyperlipidemia   . Hypertension   . History of seizure disorder   . History of atrial flutter   . Depression   . Back pain     persistent  . Hearing loss   . Diverticulitis   . H/O alcohol abuse   . Diabetes mellitus     controled by diet  . GERD (gastroesophageal reflux disease)   . Seizures ([place])   . Headache(784.0)   . Arthritis     of spine  . Sleep apnea     does not use CPAP  . Hx of echocardiogram     Echo 7/14:  Mod LVH, EF 55-60%, Gr 1 DD, mild MR, PASP 36  . AKI (acute kidney injury) ([place])     a. 6/14: resolved after d/c ARB;  b. RA U/S 7/14: no RA stenosis  . Atrial flutter ([place])   . Stroke ([place])   . Shortness of breath   . Renal stone 04/15/2014    Past Surgical History  Procedure Laterality Date  . Partial colectomy      for diverticuli  . Orchiectomy    . Umbilical hernia repair    . Cervical fusion    . Coronary artery bypass graft  2005    LIMA graft to LAD,saphenous  vein graft to diag.,circumflex, marginal,and to the RCA  . Cardiac catheterization    . Appendectomy    . Esophagogastroduodenoscopy  02/10/2012    Procedure: ESOPHAGOGASTRODUODENOSCOPY (EGD);  Surgeon: Cleotis Nipper, MD;  Location: [facility] ENDOSCOPY;  Service: Endoscopy;  Laterality: N/A;  . Foreign body removal  02/10/2012    Procedure: FOREIGN BODY REMOVAL;  Surgeon: Cleotis Nipper, MD;  Location: [facility];  Service: Endoscopy;  Laterality: N/A;  . Back surgery    . Esophagogastroduodenoscopy  06/29/2012    Procedure: ESOPHAGOGASTRODUODENOSCOPY (EGD);  Surgeon: Winfield Cunas., MD;  Location: [facility];  Service: Endoscopy;  Laterality: N/A;  . Foreign body removal  06/29/2012    Procedure: FOREIGN BODY REMOVAL;  Surgeon: Winfield Cunas., MD;  Location: [facility] ENDOSCOPY;  Service: Endoscopy;  Laterality: N/A;  . Savory dilation  06/29/2012    Procedure: SAVORY DILATION;  Surgeon: Winfield Cunas., MD;  Location: [facility];  Service: Endoscopy;  Laterality: N/A;  . Tee without cardioversion N/A 11/17/2013    Procedure: TRANSESOPHAGEAL ECHOCARDIOGRAM (TEE);  Surgeon: Larey Dresser, MD;  Location: [facility];  Service: Cardiovascular;  Laterality: N/A;  . Cystoscopy/retrograde/ureteroscopy/stone extraction with basket Left 04/16/2014    Procedure: CYSTOSCOPY/RETROGRADE/LEFT URETEROSCOPY/STONE EXTRACTION WITH BASKET AND LASER LITHOTRIPSY;  Surgeon: Malka So, MD;  Location: [facility] ORS;  Service: Urology;  Laterality: Left;  With STENT  . Left heart catheterization with coronary angiogram N/A 04/02/2012    Procedure: LEFT HEART CATHETERIZATION WITH CORONARY ANGIOGRAM;  Surgeon: Sinclair Grooms, MD;  Location[facility] CATH LAB;  Service: Cardiovascular;  Laterality: N/A;  . Loop recorder implant N/A 11/17/2013    Procedure: LOOP RECORDER IMPLANT;  Surgeon: Deboraha Sprang, MD;  Location: [facility] CATH LAB;  Service: Cardiovascular;  Laterality: N/A;    There were no vitals filed for this  visit.  Visit Diagnosis:  Unsteadiness  Decreased activity tolerance  Poor balance  Generalized weakness      Subjective Assessment - 10/23/15 1059    Subjective No new falls. No new issues. Reports the HEP is going well, except for the walking program (still gets tired quickly).   Limitations House hold activities;Walking;Standing   Patient Stated Goals "Get as good as I can."    Currently in Pain? Yes   Pain Score 6    Pain Location Back   Pain Orientation Lower   Pain Descriptors / Indicators Dull   Pain Type Chronic pain   Pain Onset More than a month ago   Aggravating Factors  Staying up   Pain Relieving Factors Laying down          [facility] Adult PT Treatment/Exercise - 10/23/15 1118    Ambulation/Gait   Ambulation/Gait Yes   Ambulation/Gait Assistance 5: Supervision   Ambulation/Gait Assistance Details 93% saturation after gait, dyspnea 3/4 afterward as well   Ambulation Distance (Feet) 520 Feet   Assistive device Rollator   Gait Pattern Step-through pattern;Wide base of support   Neuro Re-ed    Neuro Re-ed Details  Rocker board exercises alternating and simultaneous UE raises, alternating UE raises across midline, Airex pad with balloon pass with pole, boxing activity, all with no UE support min/guard to minA to decrease fall risk and increase balance necessary for safe ambulation in the community.   Exercises   Exercises Knee/Hip   Knee/Hip Exercises: Standing   Heel Raises Both;10 reps;Limitations;2 sets   Heel Raises Limitations UE support   Hip Flexion Stengthening;Both;10 reps;2 sets   Hip Flexion Limitations UE support   Hip Abduction Stengthening;Both;2 sets;10 reps   Abduction Limitations UE support   Hip Extension Stengthening;Both;2 sets;10 reps   Extension Limitations UE support   Functional Squat 2 sets;10 reps   Functional Squat Limitations UE support           PT Long Term Goals - 10/02/15 1508    PT LONG TERM GOAL #1   Title Pt will be  independent with HEP in order to indicate decreased fall risk and improved functional mobility.  (Target Date: 10/24/15)   PT LONG TERM GOAL #2   Title Pt will increase DGI score to 20/24 in order to indicate decreased fall risk.     PT LONG TERM GOAL #3   Title Will assess 6MWT and improve distance by 150' in order to indicate increase in functional endurance.    Baseline 537' on 10/02/15   PT LONG TERM GOAL #4   Title Pt will report initiation into community fitness program in order to indicate continuation of progress made in therapy.     PT LONG TERM GOAL #5   Title  Pt will increase gait speed >2.62 ft/sec in order to indicate pt is functional community ambulator.           Plan - 10/23/15 1151    Clinical Impression Statement Skilled session addressing low activity tolerance, as well as strength and balance deficits. Patient noted to ambulate easier with less fatigue today. Patient able to complete two sets of standing exercises for hip strengthening, unable before. Patient able to challenge balance more easily today. Patient is making steady progress toward goals.   Pt will benefit from skilled therapeutic intervention in order to improve on the following deficits Abnormal gait;Cardiopulmonary status limiting activity;Decreased activity tolerance;Decreased balance;Decreased cognition;Decreased endurance;Decreased mobility;Decreased strength;Impaired flexibility;Impaired perceived functional ability;Postural dysfunction;Obesity;Pain  pain is chronic and will not be addressing during this episode.   Rehab Potential Good   Clinical Impairments Affecting Rehab Potential compliance and co-morbidiities   PT Frequency 2x / week   PT Duration 4 weeks   PT Treatment/Interventions ADLs/Self Care Home Management;Gait training;Functional mobility training;DME Instruction;Stair training;Therapeutic activities;Therapeutic exercise;Balance training;Neuromuscular re-education;Cognitive  remediation;Patient/family education;Energy conservation   PT Next Visit Plan continue with gait, strengthening and balance activities toward LTGs. Assess goals next visit.   Consulted and Agree with Plan of Care Patient        Problem List Patient Active Problem List   Diagnosis Date Noted  . Cerebrovascular accident (CVA) due to embolism of right anterior cerebral artery ([place]) 09/19/2015  . History of CVA with residual deficit 09/19/2015  . Type 2 diabetes mellitus with circulatory disorder ([place]) 07/12/2015  . HLD (hyperlipidemia) 07/12/2015  . Essential hypertension 07/12/2015  . Coronary artery disease involving coronary bypass graft of native heart with unspecified angina pectoris 07/12/2015  . Intracranial carotid stenosis 07/12/2015  . Morbid obesity ([place]) 02/21/2015  . Acute ischemic stroke ([place])   . Dysarthria   . History of pulmonary embolism   . Stroke ([place]) 02/11/2015  . CVA (cerebral infarction) 02/11/2015  . Acute right ACA stroke ([place]) 02/11/2015  . Lower extremity weakness 01/08/2015  . Status post placement of implantable loop recorder 08/01/2014  . Chronic respiratory failure, unspecified whether with hypoxia or hypercapnia ([place]) 05/19/2014  . Pulmonary embolism ([place]) 05/19/2014  . Hemiparesis and speech and language deficit as late effects of stroke ([place]) 04/19/2014  . Ureteral colic 38/25/0539  . Left ureteral stone 04/15/2014  . Obstructive sleep apnea 12/07/2013  . Obesity, unspecified 12/07/2013  . Embolic stroke ([place]) 76/73/4193  . Cough 06/09/2013  . S/P lumbar spine operation 06/02/2013  . Nerve root pain 06/02/2013  . Pleural plaque consistent with asbestos exposure by CT 10/2012 02/17/2013  . Renal failure 02/13/2013  . Dyspnea 02/11/2013  . Dysphagia, pharyngoesophageal phase 05/12/2012  . Seizure disorder ([place]) 04/04/2012  . CAD (coronary artery disease) 06/13/2011  . HTN (hypertension) 06/13/2011  . Diabetes mellitus ([place]) 06/13/2011  .  History of seizure disorder   . Depression   . Back pain   . Hearing loss   . Arrhythmia   . Diverticulitis   . H/O alcohol abuse   . Personal history of other diseases of circulatory system 05/29/2011    Bayard Beaver, SPTA 10/23/2015, 1:38 PM  [facility] [address] [place], [place], [address] Phone: [phone]   Fax:  [phone]  Name: [name] MRN: [id number] Date of Birth: [birth year]  This note has been reviewed and edited by supervising CI.  Willow Ora, PTA, [facility] [address], [address]  [place], [place] [address] [phone] 10/23/2015, 2:06 PM

## 2015-10-26 ENCOUNTER — Ambulatory Visit: Payer: Medicare Other | Attending: Neurology | Admitting: Physical Therapy

## 2015-10-26 ENCOUNTER — Encounter: Payer: Medicare Other | Admitting: Occupational Therapy

## 2015-10-26 ENCOUNTER — Encounter: Payer: Self-pay | Admitting: Physical Therapy

## 2015-10-26 ENCOUNTER — Ambulatory Visit: Payer: Self-pay | Admitting: Pharmacist

## 2015-10-26 DIAGNOSIS — R2689 Other abnormalities of gait and mobility: Secondary | ICD-10-CM | POA: Diagnosis not present

## 2015-10-26 DIAGNOSIS — R2681 Unsteadiness on feet: Secondary | ICD-10-CM | POA: Diagnosis not present

## 2015-10-26 DIAGNOSIS — R531 Weakness: Secondary | ICD-10-CM | POA: Insufficient documentation

## 2015-10-26 DIAGNOSIS — Z7409 Other reduced mobility: Secondary | ICD-10-CM | POA: Diagnosis not present

## 2015-10-26 DIAGNOSIS — R6889 Other general symptoms and signs: Secondary | ICD-10-CM | POA: Diagnosis not present

## 2015-10-26 DIAGNOSIS — I63421 Cerebral infarction due to embolism of right anterior cerebral artery: Secondary | ICD-10-CM

## 2015-10-26 NOTE — Therapy (Signed)
[facility] [address] [place], [place], [address] Phone: [phone]   Fax:  [phone]  Physical Therapy Treatment  Patient Details  Name: [name] MRN: [id number] Date of Birth: Sep 18, 1945 Referring Provider: Rosalin Hawking, MD  Encounter Date: 10/26/2015      PT End of Session - 10/26/15 1408    Visit Number 9   Number of Visits 9   Date for PT Re-Evaluation 10/26/15   Authorization Type Medicare -G codes every 10th visit   PT Start Time 1402   PT Stop Time 1445   PT Time Calculation (min) 43 min   Equipment Utilized During Treatment Gait belt;Oxygen   Activity Tolerance Patient tolerated treatment well;Patient limited by fatigue   Behavior During Therapy [facility] for tasks assessed/performed      Past Medical History  Diagnosis Date  . Coronary artery disease     a. Low level exercise Lex MV 3/14: low risk, EF 62%, inf defect 2/2 diaph attenuation vs artifiact, small area of scar possible, no ischemia  . Hyperlipidemia   . Hypertension   . History of seizure disorder   . History of atrial flutter   . Depression   . Back pain     persistent  . Hearing loss   . Diverticulitis   . H/O alcohol abuse   . Diabetes mellitus     controled by diet  . GERD (gastroesophageal reflux disease)   . Seizures ([place])   . Headache(784.0)   . Arthritis     of spine  . Sleep apnea     does not use CPAP  . Hx of echocardiogram     Echo 7/14:  Mod LVH, EF 55-60%, Gr 1 DD, mild MR, PASP 36  . AKI (acute kidney injury) ([place])     a. 6/14: resolved after d/c ARB;  b. RA U/S 7/14: no RA stenosis  . Atrial flutter ([place])   . Stroke ([place])   . Shortness of breath   . Renal stone 04/15/2014    Past Surgical History  Procedure Laterality Date  . Partial colectomy      for diverticuli  . Orchiectomy    . Umbilical hernia repair    . Cervical fusion    . Coronary artery bypass graft  2005    LIMA graft to LAD,saphenous  vein graft to diag.,circumflex, marginal,and to the RCA  . Cardiac catheterization    . Appendectomy    . Esophagogastroduodenoscopy  02/10/2012    Procedure: ESOPHAGOGASTRODUODENOSCOPY (EGD);  Surgeon: Cleotis Nipper, MD;  Location: [facility] ENDOSCOPY;  Service: Endoscopy;  Laterality: N/A;  . Foreign body removal  02/10/2012    Procedure: FOREIGN BODY REMOVAL;  Surgeon: Cleotis Nipper, MD;  Location: [facility];  Service: Endoscopy;  Laterality: N/A;  . Back surgery    . Esophagogastroduodenoscopy  06/29/2012    Procedure: ESOPHAGOGASTRODUODENOSCOPY (EGD);  Surgeon: Winfield Cunas., MD;  Location: [facility];  Service: Endoscopy;  Laterality: N/A;  . Foreign body removal  06/29/2012    Procedure: FOREIGN BODY REMOVAL;  Surgeon: Winfield Cunas., MD;  Location: [facility] ENDOSCOPY;  Service: Endoscopy;  Laterality: N/A;  . Savory dilation  06/29/2012    Procedure: SAVORY DILATION;  Surgeon: Winfield Cunas., MD;  Location: [facility];  Service: Endoscopy;  Laterality: N/A;  . Tee without cardioversion N/A 11/17/2013    Procedure: TRANSESOPHAGEAL ECHOCARDIOGRAM (TEE);  Surgeon: Larey Dresser, MD;  Location: [facility];  Service: Cardiovascular;  Laterality: N/A;  . Cystoscopy/retrograde/ureteroscopy/stone extraction with basket Left 04/16/2014    Procedure: CYSTOSCOPY/RETROGRADE/LEFT URETEROSCOPY/STONE EXTRACTION WITH BASKET AND LASER LITHOTRIPSY;  Surgeon: Malka So, MD;  Location: [facility];  Service: Urology;  Laterality: Left;  With STENT  . Left heart catheterization with coronary angiogram N/A 04/02/2012    Procedure: LEFT HEART CATHETERIZATION WITH CORONARY ANGIOGRAM;  Surgeon: Sinclair Grooms, MD;  Location: [facility] CATH LAB;  Service: Cardiovascular;  Laterality: N/A;  . Loop recorder implant N/A 11/17/2013    Procedure: LOOP RECORDER IMPLANT;  Surgeon: Deboraha Sprang, MD;  Location: [facility] CATH LAB;  Service: Cardiovascular;  Laterality: N/A;    There were no vitals filed for this  visit.  Visit Diagnosis:  Unsteadiness  Decreased activity tolerance  Poor balance  Generalized weakness  Impaired mobility and ADLs      Subjective Assessment - 10/26/15 1406    Subjective No new falls. Has had a few near falls with getting out of bed and getting up from toilet, able to self corret. No new issues.   Currently in Pain? Yes   Pain Score 6    Pain Location Back   Pain Orientation Lower   Pain Descriptors / Indicators Aching;Constant;Dull   Pain Type Chronic pain   Pain Onset More than a month ago   Pain Frequency Constant   Aggravating Factors  upright posture   Pain Relieving Factors lying down            [facility] PT Assessment - 10/26/15 1411    Transfers   Sit to Stand 6: Modified independent (Device/Increase time);With upper extremity assist;From chair/3-in-1;With armrests;From bed   Stand to Sit 6: Modified independent (Device/Increase time);With upper extremity assist;To bed;To chair/3-in-1;With armrests   Ambulation/Gait   Ambulation/Gait Yes   Ambulation/Gait Assistance 5: Supervision   Ambulation/Gait Assistance Details occasional cues for rollator position with gait   Assistive device Rollator   Gait Pattern Step-through pattern;Wide base of support   Ambulation Surface Level;Indoor   Gait velocity 13.97 sec= 2.35 ft/sec with rollator assist   6 Minute Walk- Baseline   6 Minute Walk- Baseline yes   BP (mmHg) 105/73 mmHg   HR (bpm) 78   02 Sat (%RA) 95 %   Modified Borg Scale for Dyspnea 0- Nothing at all   Perceived Rate of Exertion (Borg) 6-   6 Minute walk- Post Test   6 Minute Walk Post Test yes   BP (mmHg) 113/61 mmHg   HR (bpm) 75   02 Sat (%RA) 94 %   Modified Borg Scale for Dyspnea 2- Mild shortness of breath   Perceived Rate of Exertion (Borg) 13- Somewhat hard   6 minute walk test results    Aerobic Endurance Distance Walked 844   Endurance additional comments 2 short standing rest breaks taken   Dynamic Gait Index   Level  Surface Normal   Change in Gait Speed Normal   Gait with Horizontal Head Turns Mild Impairment   Gait with Vertical Head Turns Normal   Gait and Pivot Turn Mild Impairment  4-5 seconds   Step Over Obstacle Mild Impairment   Step Around Obstacles Normal   Steps Mild Impairment   Total Score 20           [facility] Adult PT Treatment/Exercise - 10/26/15 1411    Ambulation/Gait   Ambulation Distance (Feet) 874 Feet  (includes 6 minute walk distance)  PT Long Term Goals - 10/26/15 1335    PT LONG TERM GOAL #1   Title Pt will be independent with HEP in order to indicate decreased fall risk and improved functional mobility.  (Target Date: 10/24/15)   Baseline 10/26/15: pt independant with current HEP to date with handout as reference   Status Achieved   PT LONG TERM GOAL #2   Title Pt will increase DGI score to 20/24 in order to indicate decreased fall risk.     Baseline 10/26/15: scored 20/24 today   Status Achieved   PT LONG TERM GOAL #3   Title Will assess 6MWT and improve distance by 150' in order to indicate increase in functional endurance.    Baseline 537' on 10/02/15; 844 with rollator on 10/26/15   Status Achieved   PT LONG TERM GOAL #4   Title Pt will report initiation into community fitness program in order to indicate continuation of progress made in therapy.     Baseline 10/26/15: pt has a silver sneakers card, just does not use it   Status Partially Met   PT LONG TERM GOAL #5   Title Pt will increase gait speed >2.62 ft/sec in order to indicate pt is functional community ambulator.    Baseline 2.35 ft/sec with rollator    Status Not Met            Plan - 10/26/15 1409    Clinical Impression Statement Pt has met 3/5 LTGs and made progress toward 2 not met. Primary PT to renew to continue to work on gait, balance and strengthening.    Pt will benefit from skilled therapeutic intervention in order to improve on the following deficits Abnormal  gait;Cardiopulmonary status limiting activity;Decreased activity tolerance;Decreased balance;Decreased cognition;Decreased endurance;Decreased mobility;Decreased strength;Impaired flexibility;Impaired perceived functional ability;Postural dysfunction;Obesity;Pain  pain is chronic and will not be addressing during this episode.   Rehab Potential Good   Clinical Impairments Affecting Rehab Potential compliance and co-morbidiities   PT Frequency 2x / week   PT Duration 4 weeks   PT Treatment/Interventions ADLs/Self Care Home Management;Gait training;Functional mobility training;DME Instruction;Stair training;Therapeutic activities;Therapeutic exercise;Balance training;Neuromuscular re-education;Cognitive remediation;Patient/family education;Energy conservation   PT Next Visit Plan continue toward goals of PT renewal.   Consulted and Agree with Plan of Care Patient        Problem List Patient Active Problem List   Diagnosis Date Noted  . Cerebrovascular accident (CVA) due to embolism of right anterior cerebral artery ([place]) 09/19/2015  . History of CVA with residual deficit 09/19/2015  . Type 2 diabetes mellitus with circulatory disorder ([place]) 07/12/2015  . HLD (hyperlipidemia) 07/12/2015  . Essential hypertension 07/12/2015  . Coronary artery disease involving coronary bypass graft of native heart with unspecified angina pectoris 07/12/2015  . Intracranial carotid stenosis 07/12/2015  . Morbid obesity ([place]) 02/21/2015  . Acute ischemic stroke ([place])   . Dysarthria   . History of pulmonary embolism   . Stroke ([place]) 02/11/2015  . CVA (cerebral infarction) 02/11/2015  . Acute right ACA stroke ([place]) 02/11/2015  . Lower extremity weakness 01/08/2015  . Status post placement of implantable loop recorder 08/01/2014  . Chronic respiratory failure, unspecified whether with hypoxia or hypercapnia ([place]) 05/19/2014  . Pulmonary embolism ([place]) 05/19/2014  . Hemiparesis and speech and language  deficit as late effects of stroke ([place]) 04/19/2014  . Ureteral colic 34/19/3790  . Left ureteral stone 04/15/2014  . Obstructive sleep apnea 12/07/2013  . Obesity, unspecified 12/07/2013  . Embolic stroke ([place]) 24/04/7352  .  Cough 06/09/2013  . S/P lumbar spine operation 06/02/2013  . Nerve root pain 06/02/2013  . Pleural plaque consistent with asbestos exposure by CT 10/2012 02/17/2013  . Renal failure 02/13/2013  . Dyspnea 02/11/2013  . Dysphagia, pharyngoesophageal phase 05/12/2012  . Seizure disorder ([place]) 04/04/2012  . CAD (coronary artery disease) 06/13/2011  . HTN (hypertension) 06/13/2011  . Diabetes mellitus ([place]) 06/13/2011  . History of seizure disorder   . Depression   . Back pain   . Hearing loss   . Arrhythmia   . Diverticulitis   . H/O alcohol abuse   . Personal history of other diseases of circulatory system 05/29/2011    Willow Ora 10/26/2015, 4:13 PM  Willow Ora, PTA, [facility] [address] [place], [place] [address] [phone] 10/27/2015, 10:04 AM   Name: [name] MRN: [id number] Date of Birth: 1945/12/31

## 2015-10-27 NOTE — Addendum Note (Signed)
Addended by: Cameron Sprang A on: 10/27/2015 03:50 PM   Modules accepted: Orders

## 2015-10-30 ENCOUNTER — Encounter: Payer: Self-pay | Admitting: Rehabilitation

## 2015-10-30 ENCOUNTER — Ambulatory Visit: Payer: Medicare Other | Admitting: Rehabilitation

## 2015-10-30 ENCOUNTER — Encounter: Payer: Medicare Other | Admitting: Occupational Therapy

## 2015-10-30 DIAGNOSIS — Z7409 Other reduced mobility: Secondary | ICD-10-CM

## 2015-10-30 DIAGNOSIS — Z789 Other specified health status: Secondary | ICD-10-CM

## 2015-10-30 DIAGNOSIS — R6889 Other general symptoms and signs: Secondary | ICD-10-CM

## 2015-10-30 DIAGNOSIS — R531 Weakness: Secondary | ICD-10-CM

## 2015-10-30 DIAGNOSIS — R2681 Unsteadiness on feet: Secondary | ICD-10-CM

## 2015-10-30 DIAGNOSIS — R2689 Other abnormalities of gait and mobility: Secondary | ICD-10-CM | POA: Diagnosis not present

## 2015-10-30 NOTE — Therapy (Signed)
[facility] [address] [place], [place], [address] Phone: [phone]   Fax:  [phone]  Physical Therapy Treatment  Patient Details  Name: [name] MRN: [id number] Date of Birth: 13-Apr-1946 Referring Provider: Rosalin Hawking, MD  Encounter Date: 10/30/2015      PT End of Session - 10/30/15 1622    Visit Number 10   Number of Visits 17  per updated POC   Date for PT Re-Evaluation 11/25/15  Per updated POC   Authorization Type Medicare -G codes every 10th visit   PT Start Time 1527   PT Stop Time 1613   PT Time Calculation (min) 46 min   Equipment Utilized During Treatment Gait belt;Oxygen   Activity Tolerance Patient tolerated treatment well;Patient limited by fatigue   Behavior During Therapy [facility] for tasks assessed/performed      Past Medical History  Diagnosis Date  . Coronary artery disease     a. Low level exercise Lex MV 3/14: low risk, EF 62%, inf defect 2/2 diaph attenuation vs artifiact, small area of scar possible, no ischemia  . Hyperlipidemia   . Hypertension   . History of seizure disorder   . History of atrial flutter   . Depression   . Back pain     persistent  . Hearing loss   . Diverticulitis   . H/O alcohol abuse   . Diabetes mellitus     controled by diet  . GERD (gastroesophageal reflux disease)   . Seizures ([place])   . Headache(784.0)   . Arthritis     of spine  . Sleep apnea     does not use CPAP  . Hx of echocardiogram     Echo 7/14:  Mod LVH, EF 55-60%, Gr 1 DD, mild MR, PASP 36  . AKI (acute kidney injury) ([place])     a. 6/14: resolved after d/c ARB;  b. RA U/S 7/14: no RA stenosis  . Atrial flutter ([place])   . Stroke ([place])   . Shortness of breath   . Renal stone 04/15/2014    Past Surgical History  Procedure Laterality Date  . Partial colectomy      for diverticuli  . Orchiectomy    . Umbilical hernia repair    . Cervical fusion    . Coronary artery bypass graft   2005    LIMA graft to LAD,saphenous vein graft to diag.,circumflex, marginal,and to the RCA  . Cardiac catheterization    . Appendectomy    . Esophagogastroduodenoscopy  02/10/2012    Procedure: ESOPHAGOGASTRODUODENOSCOPY (EGD);  Surgeon: Cleotis Nipper, MD;  Location: [facility] ENDOSCOPY;  Service: Endoscopy;  Laterality: N/A;  . Foreign body removal  02/10/2012    Procedure: FOREIGN BODY REMOVAL;  Surgeon: Cleotis Nipper, MD;  Location: [facility];  Service: Endoscopy;  Laterality: N/A;  . Back surgery    . Esophagogastroduodenoscopy  06/29/2012    Procedure: ESOPHAGOGASTRODUODENOSCOPY (EGD);  Surgeon: Winfield Cunas., MD;  Location: [facility] ENDOSCOPY;  Service: Endoscopy;  Laterality: N/A;  . Foreign body removal  06/29/2012    Procedure: FOREIGN BODY REMOVAL;  Surgeon: Winfield Cunas., MD;  Location: [facility] ENDOSCOPY;  Service: Endoscopy;  Laterality: N/A;  . Savory dilation  06/29/2012    Procedure: SAVORY DILATION;  Surgeon: Winfield Cunas., MD;  Location: [facility] ENDOSCOPY;  Service: Endoscopy;  Laterality: N/A;  . Tee without cardioversion N/A 11/17/2013    Procedure: TRANSESOPHAGEAL ECHOCARDIOGRAM (TEE);  Surgeon: Kirk Ruths  Claris Gladden, MD;  Location: [facility];  Service: Cardiovascular;  Laterality: N/A;  . Cystoscopy/retrograde/ureteroscopy/stone extraction with basket Left 04/16/2014    Procedure: CYSTOSCOPY/RETROGRADE/LEFT URETEROSCOPY/STONE EXTRACTION WITH BASKET AND LASER LITHOTRIPSY;  Surgeon: Malka So, MD;  Location: [facility] ORS;  Service: Urology;  Laterality: Left;  With STENT  . Left heart catheterization with coronary angiogram N/A 04/02/2012    Procedure: LEFT HEART CATHETERIZATION WITH CORONARY ANGIOGRAM;  Surgeon: Sinclair Grooms, MD;  Location: [facility] CATH LAB;  Service: Cardiovascular;  Laterality: N/A;  . Loop recorder implant N/A 11/17/2013    Procedure: LOOP RECORDER IMPLANT;  Surgeon: Deboraha Sprang, MD;  Location: [facility] CATH LAB;  Service: Cardiovascular;  Laterality: N/A;    There  were no vitals filed for this visit.  Visit Diagnosis:  Unsteadiness  Decreased activity tolerance  Poor balance  Generalized weakness  Impaired mobility and ADLs      Subjective Assessment - 10/30/15 1530    Subjective No new falls. Has had a few near falls while transitioning to/from toilet and bed but was able to self-correct.    Currently in Pain? Yes   Pain Score 6    Pain Location Back   Pain Orientation Lower   Pain Descriptors / Indicators Constant;Dull   Pain Onset More than a month ago   Aggravating Factors  Being up/active   Pain Relieving Factors Laying down          [facility] Adult PT Treatment/Exercise - 10/30/15 1539    Ambulation/Gait   Ambulation/Gait Yes   Ambulation/Gait Assistance 5: Supervision   Ambulation/Gait Assistance Details 92% oxygen saturation after gait, recovered to 96% after a short rest break.   Ambulation Distance (Feet) 500 Feet   Assistive device Rollator   Gait Pattern Step-through pattern;Wide base of support   Ambulation Surface Level;Indoor   Therapeutic Activites    Therapeutic Activities Other Therapeutic Activities   Other Therapeutic Activities Practiced transferring to bed with use of step stool. Patient was noted to have issues not sliding back far enough before engaging in sit > supine transfer. Patient has high-sitting bed at home.   Neuro Re-ed    Neuro Re-ed Details  // Bars on airex pad boxing activity to hit target, balloon activity with pole, both with no UE support performed to fatigue with seated rest break between exercises; alternating color stepping in // bars single to no UE support x 4 reps down/back min/guard to minA to decrease fall risk and increase balance necessary for safe community ambulation.   Exercises   Exercises Knee/Hip   Knee/Hip Exercises: Standing   Heel Raises Both;10 reps;Limitations;2 sets   Heel Raises Limitations UE support   Hip Flexion Stengthening;Both;10 reps;2 sets   Hip Flexion  Limitations UE support   Hip Abduction Stengthening;Both;2 sets;10 reps   Abduction Limitations UE support   Hip Extension Stengthening;Both;2 sets;10 reps   Extension Limitations UE support   Functional Squat 2 sets;10 reps   Functional Squat Limitations UE support          PT Education - 10/30/15 1621    Education provided Yes   Education Details Educated in transfers to high-sitting bed at home where patient states he fears falling.    Person(s) Educated Patient   Methods Explanation;Demonstration;Verbal cues   Comprehension Verbalized understanding;Returned demonstration;Verbal cues required          PT Long Term Goals - 10/27/15 1541    PT LONG TERM GOAL #1   Title Pt will  be independent with HEP in order to indicate decreased fall risk and improved functional mobility.  (Target Date: 10/24/15)   Baseline 10/26/15: pt independant with current HEP to date with handout as reference   Status Achieved   PT LONG TERM GOAL #2   Title Pt will increase DGI score to 20/24 in order to indicate decreased fall risk.     Baseline 10/26/15: scored 20/24 today   Status Achieved   PT LONG TERM GOAL #3   Title Will assess 6MWT and improve distance by 150' in order to indicate increase in functional endurance.    Baseline 537' on 10/02/15; 844 with rollator on 10/26/15   Status Achieved   PT LONG TERM GOAL #4   Title Pt will report initiation into community fitness program in order to indicate continuation of progress made in therapy.  (New Target Date: 11/23/15)   Baseline 10/26/15: pt has a silver sneakers card, just does not use it   Status Partially Met   PT LONG TERM GOAL #5   Title Pt will increase gait speed >2.62 ft/sec in order to indicate pt is functional community ambulator.  (New Target Date: 11/23/15)   Baseline 2.35 ft/sec with rollator    Status Not Met   Additional Long Term Goals   Additional Long Term Goals Yes   PT LONG TERM GOAL #6   Title Pt will ambulate 500' over paved  outdoor surfaces with rollator (including ramp and curb) at mod I level in order to indicate safety in community setting. (New Target 11/23/15)   Status New   PT LONG TERM GOAL #7   Title Will assess FGA and increase score by 4 points in order to indicate decreased fall risk.  (New target date: 11/23/15)   Status New          Plan - 11/13/2015 1622    Clinical Impression Statement Skilled session addressing deficits in gait, static and dynamic balance, strength, and transfers with focus on improving activity tolerance. Patient appears to be improving activity tolerance compared to previous sessions and is making steady progress toward goals.   Pt will benefit from skilled therapeutic intervention in order to improve on the following deficits Abnormal gait;Cardiopulmonary status limiting activity;Decreased activity tolerance;Decreased balance;Decreased cognition;Decreased endurance;Decreased mobility;Decreased strength;Impaired flexibility;Impaired perceived functional ability;Postural dysfunction;Obesity;Pain  pain is chronic and will not be addressing during this episode.   Rehab Potential Good   Clinical Impairments Affecting Rehab Potential compliance and co-morbidiities   PT Frequency 2x / week   PT Duration 4 weeks   PT Treatment/Interventions ADLs/Self Care Home Management;Gait training;Functional mobility training;DME Instruction;Stair training;Therapeutic activities;Therapeutic exercise;Balance training;Neuromuscular re-education;Cognitive remediation;Patient/family education;Energy conservation   PT Next Visit Plan Continue toward goals; continue with strengthening, static and dynamic balance, and gait with focus on improving activity tolerance.   Consulted and Agree with Plan of Care Patient          G-Codes - 2015-11-13 1631    Functional Assessment Tool Used DGI: 20/24 (10/26/15)   Functional Limitation Mobility: Walking and moving around   Mobility: Walking and Moving Around Current  Status [phone]) At least 1 percent but less than 20 percent impaired, limited or restricted   Mobility: Walking and Moving Around Goal Status [phone]) At least 1 percent but less than 20 percent impaired, limited or restricted      Physical Therapy Progress Note  Dates of Reporting Period: 09/26/15 to 11/13/2015  Objective Reports of Subjective Statement: See subjective above.   Objective Measurements: DGI  20/24  Goal Update: See new POC, goals above.   Plan: Continue updated POC.   Reason Skilled Services are Required: Pt continues to have high level balance, strength and endurance deficits that warrant OP neuro PT.    G-code and progress note completed by:  Cameron Sprang, PT, MPT [facility] [address] [place], [place], [address] Phone: [phone]   Fax:  [phone] 10/30/2015, 4:40 PM      Problem List Patient Active Problem List   Diagnosis Date Noted  . Cerebrovascular accident (CVA) due to embolism of right anterior cerebral artery ([place]) 09/19/2015  . History of CVA with residual deficit 09/19/2015  . Type 2 diabetes mellitus with circulatory disorder ([place]) 07/12/2015  . HLD (hyperlipidemia) 07/12/2015  . Essential hypertension 07/12/2015  . Coronary artery disease involving coronary bypass graft of native heart with unspecified angina pectoris 07/12/2015  . Intracranial carotid stenosis 07/12/2015  . Morbid obesity ([place]) 02/21/2015  . Acute ischemic stroke ([place])   . Dysarthria   . History of pulmonary embolism   . Stroke ([place]) 02/11/2015  . CVA (cerebral infarction) 02/11/2015  . Acute right ACA stroke ([place]) 02/11/2015  . Lower extremity weakness 01/08/2015  . Status post placement of implantable loop recorder 08/01/2014  . Chronic respiratory failure, unspecified whether with hypoxia or hypercapnia ([place]) 05/19/2014  . Pulmonary embolism ([facility]) 05/19/2014  . Hemiparesis and speech and language deficit as late effects  of stroke ([place]) 04/19/2014  . Ureteral colic 02/77/4128  . Left ureteral stone 04/15/2014  . Obstructive sleep apnea 12/07/2013  . Obesity, unspecified 12/07/2013  . Embolic stroke ([place]) 78/67/6720  . Cough 06/09/2013  . S/P lumbar spine operation 06/02/2013  . Nerve root pain 06/02/2013  . Pleural plaque consistent with asbestos exposure by CT 10/2012 02/17/2013  . Renal failure 02/13/2013  . Dyspnea 02/11/2013  . Dysphagia, pharyngoesophageal phase 05/12/2012  . Seizure disorder ([place]) 04/04/2012  . CAD (coronary artery disease) 06/13/2011  . HTN (hypertension) 06/13/2011  . Diabetes mellitus ([place]) 06/13/2011  . History of seizure disorder   . Depression   . Back pain   . Hearing loss   . Arrhythmia   . Diverticulitis   . H/O alcohol abuse   . Personal history of other diseases of circulatory system 05/29/2011    Bayard Beaver, SPTA 10/30/2015, 4:35 PM  [facility] [address] [place], [place], [address] Phone: [phone]   Fax:  [phone]  Name: [name] MRN: [id number] Date of Birth: Feb 28, 1946

## 2015-10-30 NOTE — Progress Notes (Signed)
Carelink summary report received. Battery status OK. Normal device function. No new symptom episodes, tachy episodes, brady, or pause episodes. No new AF episodes. Monthly summary reports and ROV/PRN 

## 2015-11-01 LAB — CUP PACEART REMOTE DEVICE CHECK: Date Time Interrogation Session: 20170217060523

## 2015-11-01 NOTE — Progress Notes (Signed)
Carelink summary report received. Battery status OK. Normal device function. No new symptom episodes, tachy episodes, brady, or pause episodes. No new AF episodes. Monthly summary reports and ROV/PRN 

## 2015-11-02 ENCOUNTER — Encounter: Payer: Medicare Other | Admitting: Occupational Therapy

## 2015-11-02 ENCOUNTER — Encounter: Payer: Self-pay | Admitting: Rehabilitation

## 2015-11-02 ENCOUNTER — Ambulatory Visit: Payer: Medicare Other | Admitting: Rehabilitation

## 2015-11-02 DIAGNOSIS — Z7409 Other reduced mobility: Secondary | ICD-10-CM | POA: Diagnosis not present

## 2015-11-02 DIAGNOSIS — R2681 Unsteadiness on feet: Secondary | ICD-10-CM | POA: Diagnosis not present

## 2015-11-02 DIAGNOSIS — R6889 Other general symptoms and signs: Secondary | ICD-10-CM

## 2015-11-02 DIAGNOSIS — R531 Weakness: Secondary | ICD-10-CM | POA: Diagnosis not present

## 2015-11-02 DIAGNOSIS — R2689 Other abnormalities of gait and mobility: Secondary | ICD-10-CM

## 2015-11-02 NOTE — Therapy (Signed)
[facility] [address] [place], [place], [address] Phone: [phone]   Fax:  [phone]  Physical Therapy Treatment  Patient Details  Name: [name] MRN: [id number] Date of Birth: Jul 03, 1946 Referring Provider: Rosalin Hawking, MD  Encounter Date: 11/02/2015      PT End of Session - 11/02/15 1838    Visit Number 11   Number of Visits 17  per updated POC   Date for PT Re-Evaluation 11/25/15  Per updated POC   Authorization Type Medicare -G codes every 10th visit   PT Start Time 7035   PT Stop Time 1532   PT Time Calculation (min) 45 min   Equipment Utilized During Treatment Gait belt;Oxygen   Activity Tolerance Patient tolerated treatment well;Patient limited by fatigue   Behavior During Therapy [facility] for tasks assessed/performed      Past Medical History  Diagnosis Date  . Coronary artery disease     a. Low level exercise Lex MV 3/14: low risk, EF 62%, inf defect 2/2 diaph attenuation vs artifiact, small area of scar possible, no ischemia  . Hyperlipidemia   . Hypertension   . History of seizure disorder   . History of atrial flutter   . Depression   . Back pain     persistent  . Hearing loss   . Diverticulitis   . H/O alcohol abuse   . Diabetes mellitus     controled by diet  . GERD (gastroesophageal reflux disease)   . Seizures ([place])   . Headache(784.0)   . Arthritis     of spine  . Sleep apnea     does not use CPAP  . Hx of echocardiogram     Echo 7/14:  Mod LVH, EF 55-60%, Gr 1 DD, mild MR, PASP 36  . AKI (acute kidney injury) ([place])     a. 6/14: resolved after d/c ARB;  b. RA U/S 7/14: no RA stenosis  . Atrial flutter ([place])   . Stroke ([place])   . Shortness of breath   . Renal stone 04/15/2014    Past Surgical History  Procedure Laterality Date  . Partial colectomy      for diverticuli  . Orchiectomy    . Umbilical hernia repair    . Cervical fusion    . Coronary artery bypass graft   2005    LIMA graft to LAD,saphenous vein graft to diag.,circumflex, marginal,and to the RCA  . Cardiac catheterization    . Appendectomy    . Esophagogastroduodenoscopy  02/10/2012    Procedure: ESOPHAGOGASTRODUODENOSCOPY (EGD);  Surgeon: Cleotis Nipper, MD;  Location: [facility] ENDOSCOPY;  Service: Endoscopy;  Laterality: N/A;  . Foreign body removal  02/10/2012    Procedure: FOREIGN BODY REMOVAL;  Surgeon: Cleotis Nipper, MD;  Location: [facility];  Service: Endoscopy;  Laterality: N/A;  . Back surgery    . Esophagogastroduodenoscopy  06/29/2012    Procedure: ESOPHAGOGASTRODUODENOSCOPY (EGD);  Surgeon: Winfield Cunas., MD;  Location: [facility] ENDOSCOPY;  Service: Endoscopy;  Laterality: N/A;  . Foreign body removal  06/29/2012    Procedure: FOREIGN BODY REMOVAL;  Surgeon: Winfield Cunas., MD;  Location: [facility] ENDOSCOPY;  Service: Endoscopy;  Laterality: N/A;  . Savory dilation  06/29/2012    Procedure: SAVORY DILATION;  Surgeon: Winfield Cunas., MD;  Location: [facility] ENDOSCOPY;  Service: Endoscopy;  Laterality: N/A;  . Tee without cardioversion N/A 11/17/2013    Procedure: TRANSESOPHAGEAL ECHOCARDIOGRAM (TEE);  Surgeon: Kirk Ruths  Claris Gladden, MD;  Location: [facility];  Service: Cardiovascular;  Laterality: N/A;  . Cystoscopy/retrograde/ureteroscopy/stone extraction with basket Left 04/16/2014    Procedure: CYSTOSCOPY/RETROGRADE/LEFT URETEROSCOPY/STONE EXTRACTION WITH BASKET AND LASER LITHOTRIPSY;  Surgeon: Malka So, MD;  Location: [facility] ORS;  Service: Urology;  Laterality: Left;  With STENT  . Left heart catheterization with coronary angiogram N/A 04/02/2012    Procedure: LEFT HEART CATHETERIZATION WITH CORONARY ANGIOGRAM;  Surgeon: Sinclair Grooms, MD;  Location: [facility] CATH LAB;  Service: Cardiovascular;  Laterality: N/A;  . Loop recorder implant N/A 11/17/2013    Procedure: LOOP RECORDER IMPLANT;  Surgeon: Deboraha Sprang, MD;  Location: [facility] CATH LAB;  Service: Cardiovascular;  Laterality: N/A;    There  were no vitals filed for this visit.  Visit Diagnosis:  Unsteadiness  Decreased activity tolerance  Poor balance  Generalized weakness      Subjective Assessment - 11/02/15 1452    Subjective No changes since last visit,    Limitations House hold activities;Walking;Standing   Patient Stated Goals "Get as good as I can."    Currently in Pain? Yes   Pain Score 6    Pain Location Back   Pain Orientation Lower   Pain Descriptors / Indicators Constant   Pain Type Chronic pain   Pain Onset More than a month ago   Pain Frequency Constant   Aggravating Factors  being up/active   Pain Relieving Factors laying   Effect of Pain on Daily Activities Not directly addressing pain as this is chronic           TE:  Performed sit<>stand from low box with airdex pad placed on top to better simulate getting up/down from toilet and better assessing near falls.  Note that during transfer to simulate toilet, pt did not ensure he was completely lined up with seat and asked therapist "am I there yet."  Once questioned, pt states that this is bigger issue as he tends to almost "miss" toilet when sitting.  Provided education on using backs of legs to ensure that he is there as well as turning to look prior to sitting to ensure safety when sitting.  Performed sit<>stand x 10 reps without use of UEs with min cues for foot placement, however pt demonstrated good LE strength.  Ended session with seated nustep x 5 mins at level 3 resistance in order to work on generalized strengthening and endurance.  Tolerated well with several rest breaks.   NMR:  Progressed to sit<>stand while feet placed on foam balance beam to address high level balance.  Note that pt tends to lack hip strategy and hip extension with posterior LOB and increased trunk rotation to the L.  Provided heavy verbal and tactile cues to correct during session, however continued to demonstrate increased difficulty.  Progressed to performing wall  bumps with foam balance beam.  Pt able to perform 2/10 times without assist or LOB posteriorly, despite heavy cues.  Then had pt perform wall bumps without beam to increase success with task and improve hip strategy and hip extension.  Performed x 10 reps each.  Added to HEP, see pt instruction.                       PT Education - 11/02/15 1453    Education provided Yes   Education Details educated on safety when getting onto toilet to prevent near fall, education on addition to HEP, see pt instruction   Person(s)  Educated Patient   Methods Explanation;Handout;Demonstration   Comprehension Verbalized understanding;Returned demonstration             PT Long Term Goals - 10/27/15 1541    PT LONG TERM GOAL #1   Title Pt will be independent with HEP in order to indicate decreased fall risk and improved functional mobility.  (Target Date: 10/24/15)   Baseline 10/26/15: pt independant with current HEP to date with handout as reference   Status Achieved   PT LONG TERM GOAL #2   Title Pt will increase DGI score to 20/24 in order to indicate decreased fall risk.     Baseline 10/26/15: scored 20/24 today   Status Achieved   PT LONG TERM GOAL #3   Title Will assess 6MWT and improve distance by 150' in order to indicate increase in functional endurance.    Baseline 537' on 10/02/15; 844 with rollator on 10/26/15   Status Achieved   PT LONG TERM GOAL #4   Title Pt will report initiation into community fitness program in order to indicate continuation of progress made in therapy.  (New Target Date: 11/23/15)   Baseline 10/26/15: pt has a silver sneakers card, just does not use it   Status Partially Met   PT LONG TERM GOAL #5   Title Pt will increase gait speed >2.62 ft/sec in order to indicate pt is functional community ambulator.  (New Target Date: 11/23/15)   Baseline 2.35 ft/sec with rollator    Status Not Met   Additional Long Term Goals   Additional Long Term Goals Yes   PT LONG  TERM GOAL #6   Title Pt will ambulate 500' over paved outdoor surfaces with rollator (including ramp and curb) at mod I level in order to indicate safety in community setting. (New Target 11/23/15)   Status New   PT LONG TERM GOAL #7   Title Will assess FGA and increase score by 4 points in order to indicate decreased fall risk.  (New target date: 11/23/15)   Status New               Plan - 11/02/15 1838    Clinical Impression Statement Skilled session focused on functional strength to prevent fall to/from toilet, high level balance and balance to encourage activation of ankle and hip strategy.  Tolerated well.     Pt will benefit from skilled therapeutic intervention in order to improve on the following deficits Abnormal gait;Cardiopulmonary status limiting activity;Decreased activity tolerance;Decreased balance;Decreased cognition;Decreased endurance;Decreased mobility;Decreased strength;Impaired flexibility;Impaired perceived functional ability;Postural dysfunction;Obesity;Pain  pain is chronic and will not be addressing during this episode.   Rehab Potential Good   Clinical Impairments Affecting Rehab Potential compliance and co-morbidiities   PT Frequency 2x / week   PT Duration 4 weeks   PT Treatment/Interventions ADLs/Self Care Home Management;Gait training;Functional mobility training;DME Instruction;Stair training;Therapeutic activities;Therapeutic exercise;Balance training;Neuromuscular re-education;Cognitive remediation;Patient/family education;Energy conservation   PT Next Visit Plan Continue toward goals; continue with strengthening, static and dynamic balance, and gait with focus on improving activity tolerance.   Consulted and Agree with Plan of Care Patient        Problem List Patient Active Problem List   Diagnosis Date Noted  . Cerebrovascular accident (CVA) due to embolism of right anterior cerebral artery ([place]) 09/19/2015  . History of CVA with residual deficit  09/19/2015  . Type 2 diabetes mellitus with circulatory disorder ([place]) 07/12/2015  . HLD (hyperlipidemia) 07/12/2015  . Essential hypertension 07/12/2015  . Coronary artery disease  involving coronary bypass graft of native heart with unspecified angina pectoris 07/12/2015  . Intracranial carotid stenosis 07/12/2015  . Morbid obesity ([place]) 02/21/2015  . Acute ischemic stroke ([place])   . Dysarthria   . History of pulmonary embolism   . Stroke ([place]) 02/11/2015  . CVA (cerebral infarction) 02/11/2015  . Acute right ACA stroke ([place]) 02/11/2015  . Lower extremity weakness 01/08/2015  . Status post placement of implantable loop recorder 08/01/2014  . Chronic respiratory failure, unspecified whether with hypoxia or hypercapnia ([place]) 05/19/2014  . Pulmonary embolism ([place]) 05/19/2014  . Hemiparesis and speech and language deficit as late effects of stroke ([place]) 04/19/2014  . Ureteral colic 00/44/7158  . Left ureteral stone 04/15/2014  . Obstructive sleep apnea 12/07/2013  . Obesity, unspecified 12/07/2013  . Embolic stroke ([place]) 06/38/6854  . Cough 06/09/2013  . S/P lumbar spine operation 06/02/2013  . Nerve root pain 06/02/2013  . Pleural plaque consistent with asbestos exposure by CT 10/2012 02/17/2013  . Renal failure 02/13/2013  . Dyspnea 02/11/2013  . Dysphagia, pharyngoesophageal phase 05/12/2012  . Seizure disorder ([place]) 04/04/2012  . CAD (coronary artery disease) 06/13/2011  . HTN (hypertension) 06/13/2011  . Diabetes mellitus ([place]) 06/13/2011  . History of seizure disorder   . Depression   . Back pain   . Hearing loss   . Arrhythmia   . Diverticulitis   . H/O alcohol abuse   . Personal history of other diseases of circulatory system 05/29/2011    Cameron Sprang, PT, MPT [facility] [address] [place], [place], [address] Phone: [phone]   Fax:  [phone] 11/02/2015, 6:41 PM  Name: [name] MRN: [id number] Date of  Birth: Jan 07, 1946

## 2015-11-02 NOTE — Patient Instructions (Signed)
Weight Shift: Anterior / Posterior (Righting / Equilibrium)    BEGIN WITH BACK LEANING AGAINST THE WALL AND FEET 4 INCHES AWAY. Move your hips off the wall and come to upright standing.  Hold for 5 seconds. Return slowly to the wall letting your hips bump the wall and return to stand.   Hold each position __5__ seconds. Repeat _10__ times per session. Do _1-2__ sessions per day.

## 2015-11-03 LAB — CUP PACEART REMOTE DEVICE CHECK: Date Time Interrogation Session: 20170118060537

## 2015-11-06 ENCOUNTER — Ambulatory Visit: Payer: Medicare Other | Admitting: Physical Therapy

## 2015-11-09 ENCOUNTER — Encounter: Payer: Self-pay | Admitting: Physical Therapy

## 2015-11-09 ENCOUNTER — Ambulatory Visit: Payer: Medicare Other | Admitting: Physical Therapy

## 2015-11-09 DIAGNOSIS — R2689 Other abnormalities of gait and mobility: Secondary | ICD-10-CM

## 2015-11-09 DIAGNOSIS — R6889 Other general symptoms and signs: Secondary | ICD-10-CM

## 2015-11-09 DIAGNOSIS — R2681 Unsteadiness on feet: Secondary | ICD-10-CM

## 2015-11-09 DIAGNOSIS — R531 Weakness: Secondary | ICD-10-CM

## 2015-11-09 DIAGNOSIS — Z7409 Other reduced mobility: Secondary | ICD-10-CM | POA: Diagnosis not present

## 2015-11-09 NOTE — Therapy (Signed)
[facility] [address] [place], [place], [address] Phone: [phone]   Fax:  [phone]  Physical Therapy Treatment  Patient Details  Name: [name] MRN: [id number] Date of Birth: 1946/05/18 Referring Provider: Rosalin Hawking, MD  Encounter Date: 11/09/2015      PT End of Session - 11/09/15 1451    Visit Number 12   Number of Visits 17  per updated POC   Date for PT Re-Evaluation 11/25/15  Per updated POC   Authorization Type Medicare -G codes every 10th visit   PT Start Time 1103   PT Stop Time 1145   PT Time Calculation (min) 42 min   Equipment Utilized During Treatment Gait belt;Oxygen   Activity Tolerance Patient tolerated treatment well;Patient limited by fatigue   Behavior During Therapy [facility] for tasks assessed/performed      Past Medical History  Diagnosis Date  . Coronary artery disease     a. Low level exercise Lex MV 3/14: low risk, EF 62%, inf defect 2/2 diaph attenuation vs artifiact, small area of scar possible, no ischemia  . Hyperlipidemia   . Hypertension   . History of seizure disorder   . History of atrial flutter   . Depression   . Back pain     persistent  . Hearing loss   . Diverticulitis   . H/O alcohol abuse   . Diabetes mellitus     controled by diet  . GERD (gastroesophageal reflux disease)   . Seizures ([place])   . Headache(784.0)   . Arthritis     of spine  . Sleep apnea     does not use CPAP  . Hx of echocardiogram     Echo 7/14:  Mod LVH, EF 55-60%, Gr 1 DD, mild MR, PASP 36  . AKI (acute kidney injury) ([place])     a. 6/14: resolved after d/c ARB;  b. RA U/S 7/14: no RA stenosis  . Atrial flutter ([place])   . Stroke ([place])   . Shortness of breath   . Renal stone 04/15/2014    Past Surgical History  Procedure Laterality Date  . Partial colectomy      for diverticuli  . Orchiectomy    . Umbilical hernia repair    . Cervical fusion    . Coronary artery bypass graft   2005    LIMA graft to LAD,saphenous vein graft to diag.,circumflex, marginal,and to the RCA  . Cardiac catheterization    . Appendectomy    . Esophagogastroduodenoscopy  02/10/2012    Procedure: ESOPHAGOGASTRODUODENOSCOPY (EGD);  Surgeon: Cleotis Nipper, MD;  Location: [facility] ENDOSCOPY;  Service: Endoscopy;  Laterality: N/A;  . Foreign body removal  02/10/2012    Procedure: FOREIGN BODY REMOVAL;  Surgeon: Cleotis Nipper, MD;  Location: [facility];  Service: Endoscopy;  Laterality: N/A;  . Back surgery    . Esophagogastroduodenoscopy  06/29/2012    Procedure: ESOPHAGOGASTRODUODENOSCOPY (EGD);  Surgeon: Winfield Cunas., MD;  Location: [facility] ENDOSCOPY;  Service: Endoscopy;  Laterality: N/A;  . Foreign body removal  06/29/2012    Procedure: FOREIGN BODY REMOVAL;  Surgeon: Winfield Cunas., MD;  Location: [facility] ENDOSCOPY;  Service: Endoscopy;  Laterality: N/A;  . Savory dilation  06/29/2012    Procedure: SAVORY DILATION;  Surgeon: Winfield Cunas., MD;  Location: [facility] ENDOSCOPY;  Service: Endoscopy;  Laterality: N/A;  . Tee without cardioversion N/A 11/17/2013    Procedure: TRANSESOPHAGEAL ECHOCARDIOGRAM (TEE);  Surgeon: Kirk Ruths  Claris Gladden, MD;  Location: [facility];  Service: Cardiovascular;  Laterality: N/A;  . Cystoscopy/retrograde/ureteroscopy/stone extraction with basket Left 04/16/2014    Procedure: CYSTOSCOPY/RETROGRADE/LEFT URETEROSCOPY/STONE EXTRACTION WITH BASKET AND LASER LITHOTRIPSY;  Surgeon: Malka So, MD;  Location: [facility] ORS;  Service: Urology;  Laterality: Left;  With STENT  . Left heart catheterization with coronary angiogram N/A 04/02/2012    Procedure: LEFT HEART CATHETERIZATION WITH CORONARY ANGIOGRAM;  Surgeon: Sinclair Grooms, MD;  Location: [facility] CATH LAB;  Service: Cardiovascular;  Laterality: N/A;  . Loop recorder implant N/A 11/17/2013    Procedure: LOOP RECORDER IMPLANT;  Surgeon: Deboraha Sprang, MD;  Location: [facility] CATH LAB;  Service: Cardiovascular;  Laterality: N/A;    There  were no vitals filed for this visit.  Visit Diagnosis:  Unsteadiness  Poor balance  Generalized weakness  Decreased activity tolerance      Subjective Assessment - 11/09/15 1106    Subjective No changes since last visit. No falls.   Limitations House hold activities;Walking;Standing   Patient Stated Goals "Get as good as I can."    Currently in Pain? Yes   Pain Score 6    Pain Location Back   Pain Orientation Lower   Pain Descriptors / Indicators Dull   Pain Onset More than a month ago   Aggravating Factors  Being up   Pain Relieving Factors Laying down          [facility] Adult PT Treatment/Exercise - 11/09/15 1108    Ambulation/Gait   Ambulation/Gait Yes   Ambulation/Gait Assistance 5: Supervision   Ambulation Distance (Feet) 500 Feet   Assistive device Rollator   Gait Pattern Step-through pattern;Wide base of support   Ambulation Surface Level;Indoor   Neuro Re-ed    Neuro Re-ed Details  Airex pad feet together boxing to hit moving target x 5', two-hand foam grip to hit moving target min/guard to min/A to decrease fall risk.   Knee/Hip Exercises: Standing   Heel Raises Both;10 reps;Limitations;2 sets   Heel Raises Limitations UE support   Hip Flexion Stengthening;Both;10 reps;2 sets   Hip Flexion Limitations UE support   Knee/Hip Exercises: Seated   Long Arc Quad Strengthening;Both;1 set;10 reps   Sit to [employer] 1 set;10 reps  From low surface          PT Long Term Goals - 10/27/15 1541    PT LONG TERM GOAL #1   Title Pt will be independent with HEP in order to indicate decreased fall risk and improved functional mobility.  (Target Date: 10/24/15)   Baseline 10/26/15: pt independant with current HEP to date with handout as reference   Status Achieved   PT LONG TERM GOAL #2   Title Pt will increase DGI score to 20/24 in order to indicate decreased fall risk.     Baseline 10/26/15: scored 20/24 today   Status Achieved   PT LONG TERM GOAL #3   Title Will assess 6MWT  and improve distance by 150' in order to indicate increase in functional endurance.    Baseline 537' on 10/02/15; 844 with rollator on 10/26/15   Status Achieved   PT LONG TERM GOAL #4   Title Pt will report initiation into community fitness program in order to indicate continuation of progress made in therapy.  (New Target Date: 11/23/15)   Baseline 10/26/15: pt has a silver sneakers card, just does not use it   Status Partially Met   PT LONG TERM GOAL #5   Title Pt  will increase gait speed >2.62 ft/sec in order to indicate pt is functional community ambulator.  (New Target Date: 11/23/15)   Baseline 2.35 ft/sec with rollator    Status Not Met   Additional Long Term Goals   Additional Long Term Goals Yes   PT LONG TERM GOAL #6   Title Pt will ambulate 500' over paved outdoor surfaces with rollator (including ramp and curb) at mod I level in order to indicate safety in community setting. (New Target 11/23/15)   Status New   PT LONG TERM GOAL #7   Title Will assess FGA and increase score by 4 points in order to indicate decreased fall risk.  (New target date: 11/23/15)   Status New          Plan - 11/09/15 1452    Clinical Impression Statement Skilled session addressing deficits in gait, strength, and balance. Patient able to consistently perform STS transfer from lower surfaces which was extremely difficult in previous sessions. Patient verbalizes feeling stronger and has easier time with transfers which were previously difficult. Patient is making steady progress toward goals.   Pt will benefit from skilled therapeutic intervention in order to improve on the following deficits Abnormal gait;Cardiopulmonary status limiting activity;Decreased activity tolerance;Decreased balance;Decreased cognition;Decreased endurance;Decreased mobility;Decreased strength;Impaired flexibility;Impaired perceived functional ability;Postural dysfunction;Obesity;Pain  pain is chronic and will not be addressing during  this episode.   Rehab Potential Good   Clinical Impairments Affecting Rehab Potential compliance and co-morbidiities   PT Frequency 2x / week   PT Duration 4 weeks   PT Treatment/Interventions ADLs/Self Care Home Management;Gait training;Functional mobility training;DME Instruction;Stair training;Therapeutic activities;Therapeutic exercise;Balance training;Neuromuscular re-education;Cognitive remediation;Patient/family education;Energy conservation   PT Next Visit Plan Continue toward goals; continue with strengthening, static and dynamic balance, and gait with focus on improving activity tolerance.   Consulted and Agree with Plan of Care Patient      Problem List Patient Active Problem List   Diagnosis Date Noted  . Cerebrovascular accident (CVA) due to embolism of right anterior cerebral artery ([place]) 09/19/2015  . History of CVA with residual deficit 09/19/2015  . Type 2 diabetes mellitus with circulatory disorder ([place]) 07/12/2015  . HLD (hyperlipidemia) 07/12/2015  . Essential hypertension 07/12/2015  . Coronary artery disease involving coronary bypass graft of native heart with unspecified angina pectoris 07/12/2015  . Intracranial carotid stenosis 07/12/2015  . Morbid obesity ([place]) 02/21/2015  . Acute ischemic stroke ([place])   . Dysarthria   . History of pulmonary embolism   . Stroke ([place]) 02/11/2015  . CVA (cerebral infarction) 02/11/2015  . Acute right ACA stroke ([place]) 02/11/2015  . Lower extremity weakness 01/08/2015  . Status post placement of implantable loop recorder 08/01/2014  . Chronic respiratory failure, unspecified whether with hypoxia or hypercapnia ([place]) 05/19/2014  . Pulmonary embolism ([place]) 05/19/2014  . Hemiparesis and speech and language deficit as late effects of stroke ([place]) 04/19/2014  . Ureteral colic 64/40/3474  . Left ureteral stone 04/15/2014  . Obstructive sleep apnea 12/07/2013  . Obesity, unspecified 12/07/2013  . Embolic stroke ([place]) 25/95/6387  .  Cough 06/09/2013  . S/P lumbar spine operation 06/02/2013  . Nerve root pain 06/02/2013  . Pleural plaque consistent with asbestos exposure by CT 10/2012 02/17/2013  . Renal failure 02/13/2013  . Dyspnea 02/11/2013  . Dysphagia, pharyngoesophageal phase 05/12/2012  . Seizure disorder ([place]) 04/04/2012  . CAD (coronary artery disease) 06/13/2011  . HTN (hypertension) 06/13/2011  . Diabetes mellitus ([place]) 06/13/2011  . History of seizure disorder   .  Depression   . Back pain   . Hearing loss   . Arrhythmia   . Diverticulitis   . H/O alcohol abuse   . Personal history of other diseases of circulatory system 05/29/2011    Bayard Beaver, SPTA 11/09/2015, 2:56 PM  [facility] [address] [place], [place], [address] Phone: [phone]   Fax:  [phone]  Name: KAYLIB FURNESS MRN: [id number] Date of Birth: [birth year]  This note has been reviewed and edited by supervising CI.  Willow Ora, PTA, [facility] [address], [place], [place] [address] [phone] 11/12/2015, 10:24 PM

## 2015-11-13 ENCOUNTER — Ambulatory Visit (INDEPENDENT_AMBULATORY_CARE_PROVIDER_SITE_OTHER): Payer: Medicare Other | Admitting: *Deleted

## 2015-11-13 DIAGNOSIS — I63421 Cerebral infarction due to embolism of right anterior cerebral artery: Secondary | ICD-10-CM | POA: Diagnosis not present

## 2015-11-14 ENCOUNTER — Ambulatory Visit: Payer: Medicare Other | Admitting: Physical Therapy

## 2015-11-14 DIAGNOSIS — R2681 Unsteadiness on feet: Secondary | ICD-10-CM | POA: Diagnosis not present

## 2015-11-14 DIAGNOSIS — R6889 Other general symptoms and signs: Secondary | ICD-10-CM

## 2015-11-14 DIAGNOSIS — R2689 Other abnormalities of gait and mobility: Secondary | ICD-10-CM

## 2015-11-14 DIAGNOSIS — Z7409 Other reduced mobility: Secondary | ICD-10-CM

## 2015-11-14 DIAGNOSIS — R531 Weakness: Secondary | ICD-10-CM | POA: Diagnosis not present

## 2015-11-14 NOTE — Therapy (Signed)
[facility] [address] [place], [place], [address] Phone: [phone]   Fax:  [phone]  Physical Therapy Treatment  Patient Details  Name: [name] MRN: [id number] Date of Birth: 1946/06/17 Referring Provider: Rosalin Hawking, MD  Encounter Date: 11/14/2015      PT End of Session - 11/14/15 1009    Visit Number 13   Number of Visits 17   Date for PT Re-Evaluation 11/25/15   Authorization Type Medicare -G codes every 10th visit   PT Start Time 0930   PT Stop Time 1008   PT Time Calculation (min) 38 min   Equipment Utilized During Treatment Gait belt;Oxygen   Activity Tolerance Patient tolerated treatment well;Patient limited by fatigue   Behavior During Therapy [facility] for tasks assessed/performed      Past Medical History  Diagnosis Date  . Coronary artery disease     a. Low level exercise Lex MV 3/14: low risk, EF 62%, inf defect 2/2 diaph attenuation vs artifiact, small area of scar possible, no ischemia  . Hyperlipidemia   . Hypertension   . History of seizure disorder   . History of atrial flutter   . Depression   . Back pain     persistent  . Hearing loss   . Diverticulitis   . H/O alcohol abuse   . Diabetes mellitus     controled by diet  . GERD (gastroesophageal reflux disease)   . Seizures ([place])   . Headache(784.0)   . Arthritis     of spine  . Sleep apnea     does not use CPAP  . Hx of echocardiogram     Echo 7/14:  Mod LVH, EF 55-60%, Gr 1 DD, mild MR, PASP 36  . AKI (acute kidney injury) ([place])     a. 6/14: resolved after d/c ARB;  b. RA U/S 7/14: no RA stenosis  . Atrial flutter ([place])   . Stroke ([place])   . Shortness of breath   . Renal stone 04/15/2014    Past Surgical History  Procedure Laterality Date  . Partial colectomy      for diverticuli  . Orchiectomy    . Umbilical hernia repair    . Cervical fusion    . Coronary artery bypass graft  2005    LIMA graft to LAD,saphenous  vein graft to diag.,circumflex, marginal,and to the RCA  . Cardiac catheterization    . Appendectomy    . Esophagogastroduodenoscopy  02/10/2012    Procedure: ESOPHAGOGASTRODUODENOSCOPY (EGD);  Surgeon: Cleotis Nipper, MD;  Location: [facility] ENDOSCOPY;  Service: Endoscopy;  Laterality: N/A;  . Foreign body removal  02/10/2012    Procedure: FOREIGN BODY REMOVAL;  Surgeon: Cleotis Nipper, MD;  Location: [facility];  Service: Endoscopy;  Laterality: N/A;  . Back surgery    . Esophagogastroduodenoscopy  06/29/2012    Procedure: ESOPHAGOGASTRODUODENOSCOPY (EGD);  Surgeon: Winfield Cunas., MD;  Location: [facility];  Service: Endoscopy;  Laterality: N/A;  . Foreign body removal  06/29/2012    Procedure: FOREIGN BODY REMOVAL;  Surgeon: Winfield Cunas., MD;  Location: [facility] ENDOSCOPY;  Service: Endoscopy;  Laterality: N/A;  . Savory dilation  06/29/2012    Procedure: SAVORY DILATION;  Surgeon: Winfield Cunas., MD;  Location: [facility];  Service: Endoscopy;  Laterality: N/A;  . Tee without cardioversion N/A 11/17/2013    Procedure: TRANSESOPHAGEAL ECHOCARDIOGRAM (TEE);  Surgeon: Larey Dresser, MD;  Location: [facility];  Service: Cardiovascular;  Laterality: N/A;  . Cystoscopy/retrograde/ureteroscopy/stone extraction with basket Left 04/16/2014    Procedure: CYSTOSCOPY/RETROGRADE/LEFT URETEROSCOPY/STONE EXTRACTION WITH BASKET AND LASER LITHOTRIPSY;  Surgeon: Malka So, MD;  Location: [facility];  Service: Urology;  Laterality: Left;  With STENT  . Left heart catheterization with coronary angiogram N/A 04/02/2012    Procedure: LEFT HEART CATHETERIZATION WITH CORONARY ANGIOGRAM;  Surgeon: Sinclair Grooms, MD;  Location[facility] CATH LAB;  Service: Cardiovascular;  Laterality: N/A;  . Loop recorder implant N/A 11/17/2013    Procedure: LOOP RECORDER IMPLANT;  Surgeon: Deboraha Sprang, MD;  Location: [facility] CATH LAB;  Service: Cardiovascular;  Laterality: N/A;    There were no vitals filed for this  visit.  Visit Diagnosis:  Unsteadiness  Poor balance  Generalized weakness  Decreased activity tolerance  Impaired mobility and ADLs      Subjective Assessment - 11/14/15 0933    Subjective Had 2 falls over the weekend; 1st Friday morning going up steps and tripped on top step - had bowl and glass in hands.  2nd fall on Saturday night "got tangled up on my feet; knees got a little weak and I went down."  Pt not using AD with either fall.     Currently in Pain? Yes   Pain Score 6    Pain Location Back   Pain Orientation Lower   Pain Descriptors / Indicators Dull   Pain Type Chronic pain   Pain Onset More than a month ago   Pain Frequency Constant   Aggravating Factors  activity   Pain Relieving Factors lying down                         [facility] Adult PT Treatment/Exercise - 11/14/15 0001    Ambulation/Gait   Ambulation/Gait Yes   Ambulation/Gait Assistance 5: Supervision   Ambulation Distance (Feet) 300 Feet  200, 100   Assistive device Rollator   Gait Pattern Step-through pattern;Wide base of support   Ambulation Surface Level;Indoor   Knee/Hip Exercises: Seated   Long Arc Quad Strengthening;Both;2 sets;10 reps;Weights   Long Arc Quad Weight 2 lbs.   Clamshell with TheraBand Green  2x10   Marching Limitations 2x10 bil with 2#   Hamstring Curl Both;2 sets;10 reps   Hamstring Limitations green tband                PT Education - 11/14/15 1008    Education provided Yes   Education Details use of rollator at home, eating downstairs to decrease risk of falling going upstairs with food; carrying food in bag to have UE support going up stairs   Person(s) Educated Patient   Methods Explanation   Comprehension Verbalized understanding             PT Long Term Goals - 10/27/15 1541    PT LONG TERM GOAL #1   Title Pt will be independent with HEP in order to indicate decreased fall risk and improved functional mobility.  (Target Date:  10/24/15)   Baseline 10/26/15: pt independant with current HEP to date with handout as reference   Status Achieved   PT LONG TERM GOAL #2   Title Pt will increase DGI score to 20/24 in order to indicate decreased fall risk.     Baseline 10/26/15: scored 20/24 today   Status Achieved   PT LONG TERM GOAL #3   Title Will assess 6MWT and improve distance by 150' in order to  indicate increase in functional endurance.    Baseline 537' on 10/02/15; 844 with rollator on 10/26/15   Status Achieved   PT LONG TERM GOAL #4   Title Pt will report initiation into community fitness program in order to indicate continuation of progress made in therapy.  (New Target Date: 11/23/15)   Baseline 10/26/15: pt has a silver sneakers card, just does not use it   Status Partially Met   PT LONG TERM GOAL #5   Title Pt will increase gait speed >2.62 ft/sec in order to indicate pt is functional community ambulator.  (New Target Date: 11/23/15)   Baseline 2.35 ft/sec with rollator    Status Not Met   Additional Long Term Goals   Additional Long Term Goals Yes   PT LONG TERM GOAL #6   Title Pt will ambulate 500' over paved outdoor surfaces with rollator (including ramp and curb) at mod I level in order to indicate safety in community setting. (New Target 11/23/15)   Status New   PT LONG TERM GOAL #7   Title Will assess FGA and increase score by 4 points in order to indicate decreased fall risk.  (New target date: 11/23/15)   Status New               Plan - 11/14/15 1009    Clinical Impression Statement Pt needs frequent rest breaks due to fatigue; O2 sats > 90% throughout.  Pt reports fall going up stairs carrying food and drink and not having UE support; therefore discussed ways to decrease fall risk.  Safest option to eat downstairs and use RW at all times but pt reports he will continue to eat upstairs.  Recommended use of bag and containers with tops to carry food upstairs and be able to have UE support.  Pt seemed  agreeable to this recommendation.   PT Next Visit Plan Continue toward goals; continue with strengthening, static and dynamic balance, and gait with focus on improving activity tolerance.        Problem List Patient Active Problem List   Diagnosis Date Noted  . Cerebrovascular accident (CVA) due to embolism of right anterior cerebral artery ([place]) 09/19/2015  . History of CVA with residual deficit 09/19/2015  . Type 2 diabetes mellitus with circulatory disorder ([place]) 07/12/2015  . HLD (hyperlipidemia) 07/12/2015  . Essential hypertension 07/12/2015  . Coronary artery disease involving coronary bypass graft of native heart with unspecified angina pectoris 07/12/2015  . Intracranial carotid stenosis 07/12/2015  . Morbid obesity ([place]) 02/21/2015  . Acute ischemic stroke ([place])   . Dysarthria   . History of pulmonary embolism   . Stroke ([place]) 02/11/2015  . CVA (cerebral infarction) 02/11/2015  . Acute right ACA stroke ([place]) 02/11/2015  . Lower extremity weakness 01/08/2015  . Status post placement of implantable loop recorder 08/01/2014  . Chronic respiratory failure, unspecified whether with hypoxia or hypercapnia ([place]) 05/19/2014  . Pulmonary embolism ([place]) 05/19/2014  . Hemiparesis and speech and language deficit as late effects of stroke ([place]) 04/19/2014  . Ureteral colic 38/75/6433  . Left ureteral stone 04/15/2014  . Obstructive sleep apnea 12/07/2013  . Obesity, unspecified 12/07/2013  . Embolic stroke ([place]) 29/51/8841  . Cough 06/09/2013  . S/P lumbar spine operation 06/02/2013  . Nerve root pain 06/02/2013  . Pleural plaque consistent with asbestos exposure by CT 10/2012 02/17/2013  . Renal failure 02/13/2013  . Dyspnea 02/11/2013  . Dysphagia, pharyngoesophageal phase 05/12/2012  . Seizure disorder ([place]) 04/04/2012  .  CAD (coronary artery disease) 06/13/2011  . HTN (hypertension) 06/13/2011  . Diabetes mellitus ([place]) 06/13/2011  . History of seizure disorder   .  Depression   . Back pain   . Hearing loss   . Arrhythmia   . Diverticulitis   . H/O alcohol abuse   . Personal history of other diseases of circulatory system 05/29/2011   Laureen Abrahams, PT, DPT 11/14/2015 10:12 AM  [facility] [address] [place], [place], [address] Phone: [phone]   Fax:  [phone]  Name: PHINEHAS GROUNDS MRN: [id number] Date of Birth: 1945-09-05

## 2015-11-14 NOTE — Progress Notes (Signed)
Carelink Summary Report / Loop Recorder 

## 2015-11-16 ENCOUNTER — Ambulatory Visit: Payer: Medicare Other | Admitting: Physical Therapy

## 2015-11-16 DIAGNOSIS — R2681 Unsteadiness on feet: Secondary | ICD-10-CM | POA: Diagnosis not present

## 2015-11-16 DIAGNOSIS — R531 Weakness: Secondary | ICD-10-CM | POA: Diagnosis not present

## 2015-11-16 DIAGNOSIS — R2689 Other abnormalities of gait and mobility: Secondary | ICD-10-CM

## 2015-11-16 DIAGNOSIS — Z7409 Other reduced mobility: Secondary | ICD-10-CM | POA: Diagnosis not present

## 2015-11-16 DIAGNOSIS — R6889 Other general symptoms and signs: Secondary | ICD-10-CM | POA: Diagnosis not present

## 2015-11-16 NOTE — Therapy (Signed)
[facility] [address] [place], [place], [address] Phone: [phone]   Fax:  [phone]  Physical Therapy Treatment  Patient Details  Name: [name] MRN: [id number] Date of Birth: 01/25/1946 Referring Provider: Rosalin Hawking, MD  Encounter Date: 11/16/2015      PT End of Session - 11/16/15 1051    Visit Number 14   Number of Visits 17   Date for PT Re-Evaluation 11/25/15   Authorization Type Medicare -G codes every 10th visit   PT Start Time 1015   PT Stop Time 1056   PT Time Calculation (min) 41 min   Equipment Utilized During Treatment Gait belt;Oxygen   Activity Tolerance Patient tolerated treatment well;Patient limited by fatigue   Behavior During Therapy [facility] for tasks assessed/performed      Past Medical History  Diagnosis Date  . Coronary artery disease     a. Low level exercise Lex MV 3/14: low risk, EF 62%, inf defect 2/2 diaph attenuation vs artifiact, small area of scar possible, no ischemia  . Hyperlipidemia   . Hypertension   . History of seizure disorder   . History of atrial flutter   . Depression   . Back pain     persistent  . Hearing loss   . Diverticulitis   . H/O alcohol abuse   . Diabetes mellitus     controled by diet  . GERD (gastroesophageal reflux disease)   . Seizures ([place])   . Headache(784.0)   . Arthritis     of spine  . Sleep apnea     does not use CPAP  . Hx of echocardiogram     Echo 7/14:  Mod LVH, EF 55-60%, Gr 1 DD, mild MR, PASP 36  . AKI (acute kidney injury) ([place])     a. 6/14: resolved after d/c ARB;  b. RA U/S 7/14: no RA stenosis  . Atrial flutter ([place])   . Stroke ([place])   . Shortness of breath   . Renal stone 04/15/2014    Past Surgical History  Procedure Laterality Date  . Partial colectomy      for diverticuli  . Orchiectomy    . Umbilical hernia repair    . Cervical fusion    . Coronary artery bypass graft  2005    LIMA graft to LAD,saphenous  vein graft to diag.,circumflex, marginal,and to the RCA  . Cardiac catheterization    . Appendectomy    . Esophagogastroduodenoscopy  02/10/2012    Procedure: ESOPHAGOGASTRODUODENOSCOPY (EGD);  Surgeon: Cleotis Nipper, MD;  Location: [facility] ENDOSCOPY;  Service: Endoscopy;  Laterality: N/A;  . Foreign body removal  02/10/2012    Procedure: FOREIGN BODY REMOVAL;  Surgeon: Cleotis Nipper, MD;  Location: [facility];  Service: Endoscopy;  Laterality: N/A;  . Back surgery    . Esophagogastroduodenoscopy  06/29/2012    Procedure: ESOPHAGOGASTRODUODENOSCOPY (EGD);  Surgeon: Winfield Cunas., MD;  Location[facility];  Service: Endoscopy;  Laterality: N/A;  . Foreign body removal  06/29/2012    Procedure: FOREIGN BODY REMOVAL;  Surgeon: Winfield Cunas., MD;  Location: [facility] ENDOSCOPY;  Service: Endoscopy;  Laterality: N/A;  . Savory dilation  06/29/2012    Procedure: SAVORY DILATION;  Surgeon: Winfield Cunas., MD;  Location: [facility];  Service: Endoscopy;  Laterality: N/A;  . Tee without cardioversion N/A 11/17/2013    Procedure: TRANSESOPHAGEAL ECHOCARDIOGRAM (TEE);  Surgeon: Larey Dresser, MD;  Location: [facility];  Service: Cardiovascular;  Laterality: N/A;  . Cystoscopy/retrograde/ureteroscopy/stone extraction with basket Left 04/16/2014    Procedure: CYSTOSCOPY/RETROGRADE/LEFT URETEROSCOPY/STONE EXTRACTION WITH BASKET AND LASER LITHOTRIPSY;  Surgeon: Malka So, MD;  Location: [facility];  Service: Urology;  Laterality: Left;  With STENT  . Left heart catheterization with coronary angiogram N/A 04/02/2012    Procedure: LEFT HEART CATHETERIZATION WITH CORONARY ANGIOGRAM;  Surgeon: Sinclair Grooms, MD;  Location: [facility] CATH LAB;  Service: Cardiovascular;  Laterality: N/A;  . Loop recorder implant N/A 11/17/2013    Procedure: LOOP RECORDER IMPLANT;  Surgeon: Deboraha Sprang, MD;  Location: [facility] CATH LAB;  Service: Cardiovascular;  Laterality: N/A;    Filed Vitals:   11/16/15 1017  SpO2: 89%     Visit Diagnosis:  Unsteadiness  Poor balance  Generalized weakness  Decreased activity tolerance  Impaired mobility and ADLs      Subjective Assessment - 11/16/15 1017    Subjective Still sore; reports he's tired today.   Patient Stated Goals "Get as good as I can."    Currently in Pain? Yes   Pain Score 6    Pain Location Back   Pain Orientation Lower   Pain Descriptors / Indicators Dull   Pain Type Chronic pain   Pain Frequency Constant   Aggravating Factors  activity   Pain Relieving Factors lying down            Neuro:  On red balance beam with feet apart: horizontal/vertical head turns with min A and intermittent UE support; 1UE support alt taps forward.    Rockerboard: weight shifting with bil UE support; static standing with min A and intermittent UE support forwards/backwards and laterally  Attempted forward step weight shifting alternating however pt with difficulty following instructions so progressed to forward step overs (over yardstick) x 10 bil with bil UE support  Nustep L4 x 5 min; 4 extremities   Pt easily distracted needing frequent cues for attention to task.  Frequent rest breaks needed as pt c/o fatigue and SOB (DOE 1/4); O2 sats monitored throughout and >95% during session  Session interrupted by pt needing to use restroom.                          PT Long Term Goals - 10/27/15 1541    PT LONG TERM GOAL #1   Title Pt will be independent with HEP in order to indicate decreased fall risk and improved functional mobility.  (Target Date: 10/24/15)   Baseline 10/26/15: pt independant with current HEP to date with handout as reference   Status Achieved   PT LONG TERM GOAL #2   Title Pt will increase DGI score to 20/24 in order to indicate decreased fall risk.     Baseline 10/26/15: scored 20/24 today   Status Achieved   PT LONG TERM GOAL #3   Title Will assess 6MWT and improve distance by 150' in order to indicate  increase in functional endurance.    Baseline 537' on 10/02/15; 844 with rollator on 10/26/15   Status Achieved   PT LONG TERM GOAL #4   Title Pt will report initiation into community fitness program in order to indicate continuation of progress made in therapy.  (New Target Date: 11/23/15)   Baseline 10/26/15: pt has a silver sneakers card, just does not use it   Status Partially Met   PT LONG TERM GOAL #5   Title Pt will increase gait speed >2.62  ft/sec in order to indicate pt is functional community ambulator.  (New Target Date: 11/23/15)   Baseline 2.35 ft/sec with rollator    Status Not Met   Additional Long Term Goals   Additional Long Term Goals Yes   PT LONG TERM GOAL #6   Title Pt will ambulate 500' over paved outdoor surfaces with rollator (including ramp and curb) at mod I level in order to indicate safety in community setting. (New Target 11/23/15)   Status New   PT LONG TERM GOAL #7   Title Will assess FGA and increase score by 4 points in order to indicate decreased fall risk.  (New target date: 11/23/15)   Status New               Plan - 11/16/15 1051    Clinical Impression Statement Pt tolerated standing exercises well today but continues to need frequent rest breaks due to fatigue.  Pt will continue to benefit from PT to maximize function and mobility.   PT Next Visit Plan Continue toward goals; continue with strengthening, static and dynamic balance, and gait with focus on improving activity tolerance; begin assessing remaining STGs   Consulted and Agree with Plan of Care Patient   Family Member Consulted wife        Problem List Patient Active Problem List   Diagnosis Date Noted  . Cerebrovascular accident (CVA) due to embolism of right anterior cerebral artery ([place]) 09/19/2015  . History of CVA with residual deficit 09/19/2015  . Type 2 diabetes mellitus with circulatory disorder ([place]) 07/12/2015  . HLD (hyperlipidemia) 07/12/2015  . Essential hypertension  07/12/2015  . Coronary artery disease involving coronary bypass graft of native heart with unspecified angina pectoris 07/12/2015  . Intracranial carotid stenosis 07/12/2015  . Morbid obesity ([place]) 02/21/2015  . Acute ischemic stroke ([place])   . Dysarthria   . History of pulmonary embolism   . Stroke ([place]) 02/11/2015  . CVA (cerebral infarction) 02/11/2015  . Acute right ACA stroke ([place]) 02/11/2015  . Lower extremity weakness 01/08/2015  . Status post placement of implantable loop recorder 08/01/2014  . Chronic respiratory failure, unspecified whether with hypoxia or hypercapnia ([place]) 05/19/2014  . Pulmonary embolism ([place]) 05/19/2014  . Hemiparesis and speech and language deficit as late effects of stroke ([place]) 04/19/2014  . Ureteral colic 93/57/0177  . Left ureteral stone 04/15/2014  . Obstructive sleep apnea 12/07/2013  . Obesity, unspecified 12/07/2013  . Embolic stroke ([place]) 93/90/3009  . Cough 06/09/2013  . S/P lumbar spine operation 06/02/2013  . Nerve root pain 06/02/2013  . Pleural plaque consistent with asbestos exposure by CT 10/2012 02/17/2013  . Renal failure 02/13/2013  . Dyspnea 02/11/2013  . Dysphagia, pharyngoesophageal phase 05/12/2012  . Seizure disorder ([place]) 04/04/2012  . CAD (coronary artery disease) 06/13/2011  . HTN (hypertension) 06/13/2011  . Diabetes mellitus ([place]) 06/13/2011  . History of seizure disorder   . Depression   . Back pain   . Hearing loss   . Arrhythmia   . Diverticulitis   . H/O alcohol abuse   . Personal history of other diseases of circulatory system 05/29/2011   Laureen Abrahams, PT, DPT 11/16/2015 10:56 AM  [facility] [address] [place], [place], [address] Phone: [phone]   Fax:  [phone]  Name: [name] MRN: [id number] Date of Birth: 02-Mar-1946

## 2015-11-21 ENCOUNTER — Ambulatory Visit: Payer: Medicare Other | Admitting: Physical Therapy

## 2015-11-21 DIAGNOSIS — Z7409 Other reduced mobility: Secondary | ICD-10-CM | POA: Diagnosis not present

## 2015-11-21 DIAGNOSIS — R2681 Unsteadiness on feet: Secondary | ICD-10-CM

## 2015-11-21 DIAGNOSIS — R531 Weakness: Secondary | ICD-10-CM

## 2015-11-21 DIAGNOSIS — R6889 Other general symptoms and signs: Secondary | ICD-10-CM

## 2015-11-21 DIAGNOSIS — R2689 Other abnormalities of gait and mobility: Secondary | ICD-10-CM

## 2015-11-21 NOTE — Therapy (Signed)
[facility] [address] [place], [place], [address] Phone: [phone]   Fax:  [phone]  Physical Therapy Treatment  Patient Details  Name: [name] MRN: [id number] Date of Birth: 04-Sep-1945 Referring Provider: Rosalin Hawking, MD  Encounter Date: 11/21/2015      PT End of Session - 11/21/15 1209    Visit Number 15   Number of Visits 17   Date for PT Re-Evaluation 11/25/15   Authorization Type Medicare -G codes every 10th visit   PT Start Time 1015   PT Stop Time 1059   PT Time Calculation (min) 44 min   Equipment Utilized During Treatment Gait belt;Oxygen   Activity Tolerance Patient tolerated treatment well;Patient limited by fatigue   Behavior During Therapy [facility] for tasks assessed/performed      Past Medical History  Diagnosis Date  . Coronary artery disease     a. Low level exercise Lex MV 3/14: low risk, EF 62%, inf defect 2/2 diaph attenuation vs artifiact, small area of scar possible, no ischemia  . Hyperlipidemia   . Hypertension   . History of seizure disorder   . History of atrial flutter   . Depression   . Back pain     persistent  . Hearing loss   . Diverticulitis   . H/O alcohol abuse   . Diabetes mellitus     controled by diet  . GERD (gastroesophageal reflux disease)   . Seizures ([place])   . Headache(784.0)   . Arthritis     of spine  . Sleep apnea     does not use CPAP  . Hx of echocardiogram     Echo 7/14:  Mod LVH, EF 55-60%, Gr 1 DD, mild MR, PASP 36  . AKI (acute kidney injury) ([place])     a. 6/14: resolved after d/c ARB;  b. RA U/S 7/14: no RA stenosis  . Atrial flutter ([place])   . Stroke ([place])   . Shortness of breath   . Renal stone 04/15/2014    Past Surgical History  Procedure Laterality Date  . Partial colectomy      for diverticuli  . Orchiectomy    . Umbilical hernia repair    . Cervical fusion    . Coronary artery bypass graft  2005    LIMA graft to LAD,saphenous  vein graft to diag.,circumflex, marginal,and to the RCA  . Cardiac catheterization    . Appendectomy    . Esophagogastroduodenoscopy  02/10/2012    Procedure: ESOPHAGOGASTRODUODENOSCOPY (EGD);  Surgeon: Cleotis Nipper, MD;  Location: [facility] ENDOSCOPY;  Service: Endoscopy;  Laterality: N/A;  . Foreign body removal  02/10/2012    Procedure: FOREIGN BODY REMOVAL;  Surgeon: Cleotis Nipper, MD;  Location: [place];  Service: Endoscopy;  Laterality: N/A;  . Back surgery    . Esophagogastroduodenoscopy  06/29/2012    Procedure: ESOPHAGOGASTRODUODENOSCOPY (EGD);  Surgeon: Winfield Cunas., MD;  Location: [facility] ENDOSCOPY;  Service: Endoscopy;  Laterality: N/A;  . Foreign body removal  06/29/2012    Procedure: FOREIGN BODY REMOVAL;  Surgeon: Winfield Cunas., MD;  Location: [facility] ENDOSCOPY;  Service: Endoscopy;  Laterality: N/A;  . Savory dilation  06/29/2012    Procedure: SAVORY DILATION;  Surgeon: Winfield Cunas., MD;  Location: [facility] ENDOSCOPY;  Service: Endoscopy;  Laterality: N/A;  . Tee without cardioversion N/A 11/17/2013    Procedure: TRANSESOPHAGEAL ECHOCARDIOGRAM (TEE);  Surgeon: Larey Dresser, MD;  Location: [place];  Service: Cardiovascular;  Laterality: N/A;  . Cystoscopy/retrograde/ureteroscopy/stone extraction with basket Left 04/16/2014    Procedure: CYSTOSCOPY/RETROGRADE/LEFT URETEROSCOPY/STONE EXTRACTION WITH BASKET AND LASER LITHOTRIPSY;  Surgeon: Malka So, MD;  Location: [facility] ORS;  Service: Urology;  Laterality: Left;  With STENT  . Left heart catheterization with coronary angiogram N/A 04/02/2012    Procedure: LEFT HEART CATHETERIZATION WITH CORONARY ANGIOGRAM;  Surgeon: Sinclair Grooms, MD;  Location: [facility] CATH LAB;  Service: Cardiovascular;  Laterality: N/A;  . Loop recorder implant N/A 11/17/2013    Procedure: LOOP RECORDER IMPLANT;  Surgeon: Deboraha Sprang, MD;  Location: [facility] CATH LAB;  Service: Cardiovascular;  Laterality: N/A;    There were no vitals filed for this  visit.  Visit Diagnosis:  Unsteadiness  Poor balance  Decreased activity tolerance  Generalized weakness  Impaired mobility and ADLs      Subjective Assessment - 11/21/15 1059    Subjective had another fall; slipped off commode that didn't have a seat or support for pt.    Patient is accompained by: Family member   Limitations House hold activities;Walking;Standing   Patient Stated Goals "Get as good as I can."    Currently in Pain? No/denies             Self Care: Long discussion with pt and wife about current progress, function and POC.  Pt's wife states pt had another fall "doing something he shouldn't have been doing." Proceeded to explain that pt slipped off commode that didn't have support for him while other bathroom has support.  Educated on need to follow PT and wife's instructions/recommendations to stay safe and decrease fall risk.  Pt's wife states pt not following PT recommendations and at this time is non compliant with exercises.  Pt's wife expresses frustration with pt due to decreased short term memory and not listening or following instructions.  Currently pt at ACE 2x/wk x 4 hours each time but wife feels this is not enough.  Recommended pt/wife inquire about PACE and wife appreciative and feel pt would benefit from this.  PT in agreement.   Reviewed HEP-pt independent                    PT Education - 11/21/15 1059    Education provided Yes   Education Details see note; long discussion with pt and wife   Person(s) Educated Patient   Methods Explanation   Comprehension Verbalized understanding             PT Long Term Goals - 10/27/15 1541    PT LONG TERM GOAL #1   Title Pt will be independent with HEP in order to indicate decreased fall risk and improved functional mobility.  (Target Date: 10/24/15)   Baseline 10/26/15: pt independant with current HEP to date with handout as reference   Status Achieved   PT LONG TERM GOAL #2    Title Pt will increase DGI score to 20/24 in order to indicate decreased fall risk.     Baseline 10/26/15: scored 20/24 today   Status Achieved   PT LONG TERM GOAL #3   Title Will assess 6MWT and improve distance by 150' in order to indicate increase in functional endurance.    Baseline 537' on 10/02/15; 844 with rollator on 10/26/15   Status Achieved   PT LONG TERM GOAL #4   Title Pt will report initiation into community fitness program in order to indicate continuation of progress made in therapy.  (New  Target Date: 11/23/15)   Baseline 10/26/15: pt has a silver sneakers card, just does not use it   Status Partially Met   PT LONG TERM GOAL #5   Title Pt will increase gait speed >2.62 ft/sec in order to indicate pt is functional community ambulator.  (New Target Date: 11/23/15)   Baseline 2.35 ft/sec with rollator    Status Not Met   Additional Long Term Goals   Additional Long Term Goals Yes   PT LONG TERM GOAL #6   Title Pt will ambulate 500' over paved outdoor surfaces with rollator (including ramp and curb) at mod I level in order to indicate safety in community setting. (New Target 11/23/15)   Status New   PT LONG TERM GOAL #7   Title Will assess FGA and increase score by 4 points in order to indicate decreased fall risk.  (New target date: 11/23/15)   Status New               Plan - 11/21/15 1209    Clinical Impression Statement Pt with limited compliance to HEP and PT's recommendations.  Pt's wife present today and frustrated with pt's limited cooperation.  Information on PACE given and pt's wife very appreciative.  Anticipate d/c next session.   PT Next Visit Plan check remaining LTGs and plan for d/c   Consulted and Agree with Plan of Care Patient   Family Member Consulted wife        Problem List Patient Active Problem List   Diagnosis Date Noted  . Cerebrovascular accident (CVA) due to embolism of right anterior cerebral artery ([place]) 09/19/2015  . History of CVA with  residual deficit 09/19/2015  . Type 2 diabetes mellitus with circulatory disorder ([place]) 07/12/2015  . HLD (hyperlipidemia) 07/12/2015  . Essential hypertension 07/12/2015  . Coronary artery disease involving coronary bypass graft of native heart with unspecified angina pectoris 07/12/2015  . Intracranial carotid stenosis 07/12/2015  . Morbid obesity ([place]) 02/21/2015  . Acute ischemic stroke ([place])   . Dysarthria   . History of pulmonary embolism   . Stroke ([place]) 02/11/2015  . CVA (cerebral infarction) 02/11/2015  . Acute right ACA stroke ([place]) 02/11/2015  . Lower extremity weakness 01/08/2015  . Status post placement of implantable loop recorder 08/01/2014  . Chronic respiratory failure, unspecified whether with hypoxia or hypercapnia ([place]) 05/19/2014  . Pulmonary embolism ([place]) 05/19/2014  . Hemiparesis and speech and language deficit as late effects of stroke ([place]) 04/19/2014  . Ureteral colic 97/67/3419  . Left ureteral stone 04/15/2014  . Obstructive sleep apnea 12/07/2013  . Obesity, unspecified 12/07/2013  . Embolic stroke ([place]) 37/90/2409  . Cough 06/09/2013  . S/P lumbar spine operation 06/02/2013  . Nerve root pain 06/02/2013  . Pleural plaque consistent with asbestos exposure by CT 10/2012 02/17/2013  . Renal failure 02/13/2013  . Dyspnea 02/11/2013  . Dysphagia, pharyngoesophageal phase 05/12/2012  . Seizure disorder ([place]) 04/04/2012  . CAD (coronary artery disease) 06/13/2011  . HTN (hypertension) 06/13/2011  . Diabetes mellitus ([place]) 06/13/2011  . History of seizure disorder   . Depression   . Back pain   . Hearing loss   . Arrhythmia   . Diverticulitis   . H/O alcohol abuse   . Personal history of other diseases of circulatory system 05/29/2011   Laureen Abrahams, PT, DPT 11/21/2015 12:11 PM  [facility] [address] [place], [place], [address] Phone: [phone]   Fax:  [phone]  Name:  ROSEMARY PENTECOST MRN: [id number] Date of Birth: 03-Aug-1946

## 2015-11-23 ENCOUNTER — Ambulatory Visit: Payer: Medicare Other | Admitting: Physical Therapy

## 2015-11-23 DIAGNOSIS — R2681 Unsteadiness on feet: Secondary | ICD-10-CM | POA: Diagnosis not present

## 2015-11-23 DIAGNOSIS — R2689 Other abnormalities of gait and mobility: Secondary | ICD-10-CM | POA: Diagnosis not present

## 2015-11-23 DIAGNOSIS — R6889 Other general symptoms and signs: Secondary | ICD-10-CM

## 2015-11-23 DIAGNOSIS — Z7409 Other reduced mobility: Secondary | ICD-10-CM | POA: Diagnosis not present

## 2015-11-23 DIAGNOSIS — R531 Weakness: Secondary | ICD-10-CM | POA: Diagnosis not present

## 2015-11-23 NOTE — Therapy (Signed)
[facility] [address] [place], [place], [address] Phone: [phone]   Fax:  [phone]  Physical Therapy Treatment  Patient Details  Name: SHAHAN STARKS MRN: [id number] Date of Birth: 03-19-1946 Referring Provider: Rosalin Hawking, MD  Encounter Date: 11/23/2015      PT End of Session - 11/23/15 1125    Visit Number 16   Number of Visits 17   Date for PT Re-Evaluation 11/25/15   Authorization Type Medicare -G codes every 10th visit   PT Start Time 1019   PT Stop Time 1102   PT Time Calculation (min) 43 min   Equipment Utilized During Treatment Gait belt;Oxygen   Activity Tolerance Patient tolerated treatment well;Patient limited by fatigue   Behavior During Therapy [facility] for tasks assessed/performed      Past Medical History  Diagnosis Date  . Coronary artery disease     a. Low level exercise Lex MV 3/14: low risk, EF 62%, inf defect 2/2 diaph attenuation vs artifiact, small area of scar possible, no ischemia  . Hyperlipidemia   . Hypertension   . History of seizure disorder   . History of atrial flutter   . Depression   . Back pain     persistent  . Hearing loss   . Diverticulitis   . H/O alcohol abuse   . Diabetes mellitus     controled by diet  . GERD (gastroesophageal reflux disease)   . Seizures ([place])   . Headache(784.0)   . Arthritis     of spine  . Sleep apnea     does not use CPAP  . Hx of echocardiogram     Echo 7/14:  Mod LVH, EF 55-60%, Gr 1 DD, mild MR, PASP 36  . AKI (acute kidney injury) ([place])     a. 6/14: resolved after d/c ARB;  b. RA U/S 7/14: no RA stenosis  . Atrial flutter ([place])   . Stroke ([place])   . Shortness of breath   . Renal stone 04/15/2014    Past Surgical History  Procedure Laterality Date  . Partial colectomy      for diverticuli  . Orchiectomy    . Umbilical hernia repair    . Cervical fusion    . Coronary artery bypass graft  2005    LIMA graft to LAD,saphenous  vein graft to diag.,circumflex, marginal,and to the RCA  . Cardiac catheterization    . Appendectomy    . Esophagogastroduodenoscopy  02/10/2012    Procedure: ESOPHAGOGASTRODUODENOSCOPY (EGD);  Surgeon: Cleotis Nipper, MD;  Location: [facility] ENDOSCOPY;  Service: Endoscopy;  Laterality: N/A;  . Foreign body removal  02/10/2012    Procedure: FOREIGN BODY REMOVAL;  Surgeon: Cleotis Nipper, MD;  Location: [place];  Service: Endoscopy;  Laterality: N/A;  . Back surgery    . Esophagogastroduodenoscopy  06/29/2012    Procedure: ESOPHAGOGASTRODUODENOSCOPY (EGD);  Surgeon: Winfield Cunas., MD;  Location: [facility];  Service: Endoscopy;  Laterality: N/A;  . Foreign body removal  06/29/2012    Procedure: FOREIGN BODY REMOVAL;  Surgeon: Winfield Cunas., MD;  Location: [facility] ENDOSCOPY;  Service: Endoscopy;  Laterality: N/A;  . Savory dilation  06/29/2012    Procedure: SAVORY DILATION;  Surgeon: Winfield Cunas., MD;  Location: [facility] ENDOSCOPY;  Service: Endoscopy;  Laterality: N/A;  . Tee without cardioversion N/A 11/17/2013    Procedure: TRANSESOPHAGEAL ECHOCARDIOGRAM (TEE);  Surgeon: Larey Dresser, MD;  Location: [facility];  Service: Cardiovascular;  Laterality: N/A;  . Cystoscopy/retrograde/ureteroscopy/stone extraction with basket Left 04/16/2014    Procedure: CYSTOSCOPY/RETROGRADE/LEFT URETEROSCOPY/STONE EXTRACTION WITH BASKET AND LASER LITHOTRIPSY;  Surgeon: Malka So, MD;  Location: [facility] ORS;  Service: Urology;  Laterality: Left;  With STENT  . Left heart catheterization with coronary angiogram N/A 04/02/2012    Procedure: LEFT HEART CATHETERIZATION WITH CORONARY ANGIOGRAM;  Surgeon: Sinclair Grooms, MD;  Location: [facility] CATH LAB;  Service: Cardiovascular;  Laterality: N/A;  . Loop recorder implant N/A 11/17/2013    Procedure: LOOP RECORDER IMPLANT;  Surgeon: Deboraha Sprang, MD;  Location: [facility] CATH LAB;  Service: Cardiovascular;  Laterality: N/A;    Filed Vitals:   11/23/15 1021  SpO2: 93%     Visit Diagnosis:  Unsteadiness  Decreased activity tolerance      Subjective Assessment - 11/23/15 1021    Subjective Denies falls or changes since last visit.  Did his exercises the past several days.   Currently in Pain? Yes   Pain Score 7    Pain Location Back   Pain Orientation Lower   Pain Descriptors / Indicators Dull   Pain Type Chronic pain   Aggravating Factors  activity   Pain Relieving Factors lying down            [facility] PT Assessment - 11/23/15 0001    Functional Gait  Assessment   Gait assessed  --                     [facility] Adult PT Treatment/Exercise - 11/23/15 0001    Transfers   Transfers Sit to Stand;Stand to Sit   Sit to Stand 6: Modified independent (Device/Increase time);With upper extremity assist;From chair/3-in-1;With armrests;From bed   Stand to Sit 6: Modified independent (Device/Increase time);With upper extremity assist;To bed;To chair/3-in-1;With armrests   Ambulation/Gait   Ambulation/Gait Yes   Ambulation/Gait Assistance 5: Supervision   Ambulation/Gait Assistance Details 93% SaO2 on 3L by nasal cannula;HR 79   Ambulation Distance (Feet) 340 Feet  plus performing DGI along with 2 1/2 minute walk   Assistive device Rollator   Gait Pattern Step-through pattern;Wide base of support   Ambulation Surface Level;Indoor   Gait velocity 2.79 ft/sec with rollator   Stairs Yes   Stairs Assistance 5: Supervision   Stair Management Technique One rail Left;Alternating pattern;Step to pattern;Forwards  alternating pattern up and step to down   Number of Stairs 8   Height of Stairs 6   Dynamic Gait Index   Level Surface Mild Impairment   Change in Gait Speed Mild Impairment   Gait with Horizontal Head Turns Mild Impairment   Gait with Vertical Head Turns Mild Impairment   Gait and Pivot Turn Mild Impairment   Step Over Obstacle Moderate Impairment   Step Around Obstacles Mild Impairment   Steps Moderate Impairment   Total Score  14   Knee/Hip Exercises: Aerobic   Other Aerobic foot pedaler x 3 minutes                PT Education - 11/23/15 1120    Education provided Yes   Education Details Walking program, optimatl community fitness, importance of optimal community fitness, foot pedaler for home use   Person(s) Educated Patient;Spouse   Methods Explanation;Demonstration;Verbal cues;Handout   Comprehension Verbalized understanding             PT Long Term Goals - 11/23/15 1122    PT LONG TERM GOAL #1   Title  Pt will be independent with HEP in order to indicate decreased fall risk and improved functional mobility.  (Target Date: 10/24/15)   Baseline 10/26/15: pt independant with current HEP to date with handout as reference   Status Achieved   PT LONG TERM GOAL #2   Title Pt will increase DGI score to 20/24 in order to indicate decreased fall risk.     Baseline 10/26/15: scored 20/24 today   Status Achieved   PT LONG TERM GOAL #3   Title Will assess 6MWT and improve distance by 150' in order to indicate increase in functional endurance.    Baseline 537' on 10/02/15; 844 with rollator on 10/26/15   Status Achieved   PT LONG TERM GOAL #4   Title Pt will report initiation into community fitness program in order to indicate continuation of progress made in therapy.  (New Target Date: 11/25/15)   Baseline 10/26/15: pt has a silver sneakers card, just does not use it 11/25/15-pt has not started community fitness program and limited participation with HEP   Status Not Met   PT LONG TERM GOAL #5   Title Pt will increase gait speed >2.62 ft/sec in order to indicate pt is functional community ambulator.  (New Target Date: 11/25/2015)   Baseline 2.79 ft/sec with rollator on 2015/11/25   Status Achieved   PT LONG TERM GOAL #6   Title Pt will ambulate 500' over paved outdoor surfaces with rollator (including ramp and curb) at mod I level in order to indicate safety in community setting. (New Target 11/25/15)   Baseline Pt  only able to ambulate 340' with Rollator and 3L O2 before needing rest break and needed supervision/CGA for safety   Status Not Met   PT LONG TERM GOAL #7   Title Will assess FGA and increase score by 4 points in order to indicate decreased fall risk.  (New target date: 2015/11/25)   Baseline FGA not performed;pt with decline in DGI score to 14/24   Status Not Met               Plan - 11/25/2015 1136    Clinical Impression Statement Pt met LTG #5.  Did not meet LTG# 4, 6 or 7 and actually has declined in DGI since evaluation along with walking distance.  Pt with limited compliance with HEP.  Discharge from Physical Therapy per Faustino Congress, PT.   PT Next Visit Plan Discharge from PT per Faustino Congress, PT   Consulted and Agree with Plan of Care Patient   Family Member Consulted wife          G-Codes - 11-25-2015 1138    Functional Assessment Tool Used DGI 14/24      Problem List Patient Active Problem List   Diagnosis Date Noted  . Cerebrovascular accident (CVA) due to embolism of right anterior cerebral artery ([place]) 09/19/2015  . History of CVA with residual deficit 09/19/2015  . Type 2 diabetes mellitus with circulatory disorder ([place]) 07/12/2015  . HLD (hyperlipidemia) 07/12/2015  . Essential hypertension 07/12/2015  . Coronary artery disease involving coronary bypass graft of native heart with unspecified angina pectoris 07/12/2015  . Intracranial carotid stenosis 07/12/2015  . Morbid obesity ([place]) 02/21/2015  . Acute ischemic stroke ([place])   . Dysarthria   . History of pulmonary embolism   . Stroke ([place]) 02/11/2015  . CVA (cerebral infarction) 02/11/2015  . Acute right ACA stroke ([place]) 02/11/2015  . Lower extremity weakness 01/08/2015  . Status post placement of  implantable loop recorder 08/01/2014  . Chronic respiratory failure, unspecified whether with hypoxia or hypercapnia ([place]) 05/19/2014  . Pulmonary embolism ([place]) 05/19/2014  . Hemiparesis and speech  and language deficit as late effects of stroke ([place]) 04/19/2014  . Ureteral colic 58/94/8347  . Left ureteral stone 04/15/2014  . Obstructive sleep apnea 12/07/2013  . Obesity, unspecified 12/07/2013  . Embolic stroke ([place]) 58/30/7460  . Cough 06/09/2013  . S/P lumbar spine operation 06/02/2013  . Nerve root pain 06/02/2013  . Pleural plaque consistent with asbestos exposure by CT 10/2012 02/17/2013  . Renal failure 02/13/2013  . Dyspnea 02/11/2013  . Dysphagia, pharyngoesophageal phase 05/12/2012  . Seizure disorder ([place]) 04/04/2012  . CAD (coronary artery disease) 06/13/2011  . HTN (hypertension) 06/13/2011  . Diabetes mellitus ([place]) 06/13/2011  . History of seizure disorder   . Depression   . Back pain   . Hearing loss   . Arrhythmia   . Diverticulitis   . H/O alcohol abuse   . Personal history of other diseases of circulatory system 05/29/2011    Narda Bonds 11/23/2015, 11:39 AM  [facility] [address] [place], [place], [address] Phone: [phone]   Fax:  [phone]  Name: ISAO SELTZER MRN: [id number] Date of Birth: 1946-05-26    [facility], [place] 11/23/2015 11:39 AM Phone: [phone] Fax: [phone]

## 2015-11-23 NOTE — Patient Instructions (Signed)
Walking Program:  Begin walking for exercise for 2 minutes, 3 times/day, 3-5 days/week.   Progress your walking program by adding 1-2 minutes to your routine each week, as tolerated. Be sure to wear good walking shoes, walk in a safe environment and only progress to your tolerance.      Optimal Fitness Program after Therapy  1)  Therapy Home Exercise Program  -Do these Exercises DAILY as instructed by your therapist  -These exercises are important to perform consistently, even when therapist has  finished, because these therapy exercises often address your specific difficulties   2)  Walking  - Work up to walking 3-5 times per week, 20 minutes per day  -This can be done at home, driveway, quiet street or an indoor track    3)  Aerobic Exercise  -Work up to 3 times per week, 20 minutes per day  -This can be stationary bike at gym

## 2015-11-28 ENCOUNTER — Ambulatory Visit: Payer: Medicare Other | Admitting: Physical Therapy

## 2015-11-30 ENCOUNTER — Ambulatory Visit: Payer: Medicare Other | Admitting: Physical Therapy

## 2015-12-04 ENCOUNTER — Ambulatory Visit (INDEPENDENT_AMBULATORY_CARE_PROVIDER_SITE_OTHER): Payer: Medicare Other | Admitting: Internal Medicine

## 2015-12-04 ENCOUNTER — Encounter: Payer: Self-pay | Admitting: Internal Medicine

## 2015-12-04 VITALS — BP 125/77 | HR 80 | Ht 66.0 in | Wt 225.6 lb

## 2015-12-04 DIAGNOSIS — I2510 Atherosclerotic heart disease of native coronary artery without angina pectoris: Secondary | ICD-10-CM | POA: Diagnosis not present

## 2015-12-04 DIAGNOSIS — I639 Cerebral infarction, unspecified: Secondary | ICD-10-CM | POA: Diagnosis not present

## 2015-12-04 LAB — CUP PACEART INCLINIC DEVICE CHECK: Date Time Interrogation Session: 20170410165359

## 2015-12-04 NOTE — Progress Notes (Signed)
Patient Care Team: Darlyne Russian, MD as PCP - General (Family Medicine) Domingo Pulse, MD (Urology) Jenne Campus, MD as Referring Physician (Neurosurgery)   HPI  [name] is a 70 y.o. male Seen in follow-up for loop recorder implanted 3/15 for cryptogenic stroke.  He has a history of ischemic heart disease with prior bypass surgery in 2005. Echocardiogram 9/15 demonstrated normal LV function;   this was undertaken when he had presented with a history of dyspnea and was found on angiography to have pulmonary embolism.  He was on warfarin  He is currently taking apixaban. He is also on aspirin.  He has major issues with somnolence following his stroke.  There is also unfortunately a great deal of emotional lability which his wife describes as getting angry at her and yelling at her Past Medical History  Diagnosis Date  . Coronary artery disease     a. Low level exercise Lex MV 3/14: low risk, EF 62%, inf defect 2/2 diaph attenuation vs artifiact, small area of scar possible, no ischemia  . Hyperlipidemia   . Hypertension   . History of seizure disorder   . History of atrial flutter   . Depression   . Back pain     persistent  . Hearing loss   . Diverticulitis   . H/O alcohol abuse   . Diabetes mellitus     controled by diet  . GERD (gastroesophageal reflux disease)   . Seizures ([place])   . Headache(784.0)   . Arthritis     of spine  . Sleep apnea     does not use CPAP  . Hx of echocardiogram     Echo 7/14:  Mod LVH, EF 55-60%, Gr 1 DD, mild MR, PASP 36  . AKI (acute kidney injury) ([place])     a. 6/14: resolved after d/c ARB;  b. RA U/S 7/14: no RA stenosis  . Atrial flutter ([place])   . Stroke ([place])   . Shortness of breath   . Renal stone 04/15/2014    Past Surgical History  Procedure Laterality Date  . Partial colectomy      for diverticuli  . Orchiectomy    . Umbilical hernia repair    . Cervical fusion    . Coronary artery bypass graft  2005    LIMA  graft to LAD,saphenous vein graft to diag.,circumflex, marginal,and to the RCA  . Cardiac catheterization    . Appendectomy    . Esophagogastroduodenoscopy  02/10/2012    Procedure: ESOPHAGOGASTRODUODENOSCOPY (EGD);  Surgeon: Cleotis Nipper, MD;  Location: [facility] ENDOSCOPY;  Service: Endoscopy;  Laterality: N/A;  . Foreign body removal  02/10/2012    Procedure: FOREIGN BODY REMOVAL;  Surgeon: Cleotis Nipper, MD;  Location: [place];  Service: Endoscopy;  Laterality: N/A;  . Back surgery    . Esophagogastroduodenoscopy  06/29/2012    Procedure: ESOPHAGOGASTRODUODENOSCOPY (EGD);  Surgeon: Winfield Cunas., MD;  Location: [facility] ENDOSCOPY;  Service: Endoscopy;  Laterality: N/A;  . Foreign body removal  06/29/2012    Procedure: FOREIGN BODY REMOVAL;  Surgeon: Winfield Cunas., MD;  Location: [facility] ENDOSCOPY;  Service: Endoscopy;  Laterality: N/A;  . Savory dilation  06/29/2012    Procedure: SAVORY DILATION;  Surgeon: Winfield Cunas., MD;  Location: [facility] ENDOSCOPY;  Service: Endoscopy;  Laterality: N/A;  . Tee without cardioversion N/A 11/17/2013    Procedure: TRANSESOPHAGEAL ECHOCARDIOGRAM (TEE);  Surgeon: Larey Dresser, MD;  Location: [facility] ENDOSCOPY;  Service: Cardiovascular;  Laterality: N/A;  . Cystoscopy/retrograde/ureteroscopy/stone extraction with basket Left 04/16/2014    Procedure: CYSTOSCOPY/RETROGRADE/LEFT URETEROSCOPY/STONE EXTRACTION WITH BASKET AND LASER LITHOTRIPSY;  Surgeon: Malka So, MD;  Location: [facility] ORS;  Service: Urology;  Laterality: Left;  With STENT  . Left heart catheterization with coronary angiogram N/A 04/02/2012    Procedure: LEFT HEART CATHETERIZATION WITH CORONARY ANGIOGRAM;  Surgeon: Sinclair Grooms, MD;  Location: [facility] CATH LAB;  Service: Cardiovascular;  Laterality: N/A;  . Loop recorder implant N/A 11/17/2013    Procedure: LOOP RECORDER IMPLANT;  Surgeon: Deboraha Sprang, MD;  Location: [facility] CATH LAB;  Service: Cardiovascular;  Laterality: N/A;    Current Outpatient  Prescriptions  Medication Sig Dispense Refill  . aspirin EC 81 MG EC tablet Take 1 tablet (81 mg total) by mouth daily. 30 tablet 0  . atorvastatin (LIPITOR) 80 MG tablet Take 1 tablet (80 mg total) by mouth daily. (Patient taking differently: Take 80 mg by mouth at bedtime. ) 30 tablet 11  . ELIQUIS 5 MG TABS tablet TAKE 1 TABLET(5 MG) BY MOUTH TWICE DAILY 60 tablet 1  . fenofibrate (TRICOR) 145 MG tablet Take 1 tablet (145 mg total) by mouth daily. 30 tablet 12  . FLUoxetine (PROZAC) 10 MG capsule Take 30 mg by mouth every morning.    . fluticasone (FLONASE) 50 MCG/ACT nasal spray Place 2 sprays into both nostrils daily.    . furosemide (LASIX) 20 MG tablet TAKE 1-2 TABLETS BY MOUTH EVERY DAY AS NEEDED (Patient taking differently: 40 mg DAILY in the morning (4am)) 60 tablet 3  . lamoTRIgine (LAMICTAL) 100 MG tablet Take 1 tablet (100 mg total) by mouth 2 (two) times daily. 60 tablet 3  . meclizine (ANTIVERT) 12.5 MG tablet Take 1 tablet (12.5 mg total) by mouth 2 (two) times daily as needed for dizziness. 30 tablet 0  . methocarbamol (ROBAXIN) 750 MG tablet Take 750 mg by mouth every 8 (eight) hours as needed for muscle spasms.     . metoprolol (LOPRESSOR) 50 MG tablet Take 0.5 tablets (25 mg total) by mouth 2 (two) times daily. (Patient taking differently: Take 25 mg by mouth every morning. Take 1/2 daily) 60 tablet 3  . nitroGLYCERIN (NITROSTAT) 0.4 MG SL tablet Place 1 tablet (0.4 mg total) under the tongue every 5 (five) minutes as needed. For chest pain 25 tablet 1  . ONE TOUCH ULTRA TEST test strip USE TO TEST BLOOD SUGAR DAILY 100 each 0  . ONETOUCH DELICA LANCETS FINE MISC     . potassium citrate (UROCIT-K) 10 MEQ (1080 MG) SR tablet Take 10 mEq by mouth 3 (three) times daily with meals.     . sitaGLIPtin (JANUVIA) 100 MG tablet Take 100 mg by mouth every morning. Reported on 09/19/2015    . traZODone (DESYREL) 150 MG tablet Take 150 mg by mouth at bedtime.     No current  facility-administered medications for this visit.    Allergies  Allergen Reactions  . Cephalexin     Unknown  . Doxycycline     Unknown  . Keflex [Cephalexin]   . Lac Bovis Other (See Comments)    lactose intolerant  . Pentazocine Lactate     He passed out- he had a seizure.  This occurred around 2000  . Talwin [Pentazocine] Other (See Comments)    hallucinations  . Tape Other (See Comments)    Paper tape only please.  . Zanaflex [Tizanidine Hcl]  Other (See Comments)    Lightheaded and dizzy    Review of Systems negative except from HPI and PMH  Physical Exam BP 125/77 mmHg  Pulse 80  Ht 5' 6"  (1.676 m)  Wt 225 lb 9.6 oz (102.331 kg)  BMI 36.43 kg/m2 Well developed and well nourished in no acute distress wearing 0)2   Clear to ausculation Device pocket well healed; without hematoma or erythema.  There is no tethering  Regular rate and rhythm,  No clubbing cyanosis Alert and oriented, grossly normal motor and sensory function Skin Warm and Dry  ECG demonstrates sinus rhythm at 78 Intervals 15/08/41 Nonspecific ST changes  Assessment and  Plan  Loop recorder   Pulmonary embolism  Cryptogenic stroke  CAD with prior CABg  He is currently anticoagulated. Hence, we will stop using the link recorder for atrial fibrillation detection.  There are no data that there is adjunct of benefit to his aspirin; we will stop it.   His blood pressure is low. We'll stop his metoprolol which is he taking only once a day anyway  The issues of somnolence  may be related to stroke or medications. I've asked him to follow-up with his physicians regarding this.

## 2015-12-04 NOTE — Patient Instructions (Signed)
Medication Instructions: 1) Stop aspirin 2) Stop metoprolol  Labwork: - none  Procedures/Testing: - none  Follow-Up: - Dr. Caryl Comes will see you back on an as needed basis.  Any Additional Special Instructions Will Be Listed Below (If Applicable).     If you need a refill on your cardiac medications before your next appointment, please call your pharmacy.

## 2015-12-07 ENCOUNTER — Encounter: Payer: Self-pay | Admitting: Emergency Medicine

## 2015-12-07 DIAGNOSIS — E119 Type 2 diabetes mellitus without complications: Secondary | ICD-10-CM | POA: Diagnosis not present

## 2015-12-07 DIAGNOSIS — H2511 Age-related nuclear cataract, right eye: Secondary | ICD-10-CM | POA: Diagnosis not present

## 2015-12-07 DIAGNOSIS — Z961 Presence of intraocular lens: Secondary | ICD-10-CM | POA: Diagnosis not present

## 2015-12-07 DIAGNOSIS — H02831 Dermatochalasis of right upper eyelid: Secondary | ICD-10-CM | POA: Diagnosis not present

## 2015-12-07 DIAGNOSIS — H02834 Dermatochalasis of left upper eyelid: Secondary | ICD-10-CM | POA: Diagnosis not present

## 2015-12-07 DIAGNOSIS — H43813 Vitreous degeneration, bilateral: Secondary | ICD-10-CM | POA: Diagnosis not present

## 2015-12-07 DIAGNOSIS — H52223 Regular astigmatism, bilateral: Secondary | ICD-10-CM | POA: Diagnosis not present

## 2015-12-07 LAB — HM DIABETES EYE EXAM

## 2015-12-12 ENCOUNTER — Ambulatory Visit (INDEPENDENT_AMBULATORY_CARE_PROVIDER_SITE_OTHER): Payer: Medicare Other | Admitting: *Deleted

## 2015-12-12 DIAGNOSIS — I639 Cerebral infarction, unspecified: Secondary | ICD-10-CM

## 2015-12-12 NOTE — Progress Notes (Signed)
Carelink Summary Report / Loop Recorder 

## 2015-12-29 ENCOUNTER — Encounter: Payer: Self-pay | Admitting: Physical Therapy

## 2015-12-29 NOTE — Therapy (Signed)
[facility] [address] [place], [place], [address] Phone: [phone]   Fax:  [phone]  Patient Details  Name: [name] MRN: [id number] Date of Birth: November 18, 1945 Referring Provider:  No ref. provider found  Encounter Date: 12/29/2015    PHYSICAL THERAPY DISCHARGE SUMMARY  Visits from Start of Care: 16    Current functional level related to goals / functional outcomes:  PT LONG TERM GOAL #1   Title Pt will be independent with HEP in order to indicate decreased fall risk and improved functional mobility. (Target Date: 10/24/15)   Baseline 10/26/15: pt independant with current HEP to date with handout as reference   Status Achieved   PT LONG TERM GOAL #2   Title Pt will increase DGI score to 20/24 in order to indicate decreased fall risk.    Baseline 10/26/15: scored 20/24 today   Status Achieved   PT LONG TERM GOAL #3   Title Will assess 6MWT and improve distance by 150' in order to indicate increase in functional endurance.    Baseline 537' on 10/02/15; 844 with rollator on 10/26/15   Status Achieved   PT LONG TERM GOAL #4   Title Pt will report initiation into community fitness program in order to indicate continuation of progress made in therapy. (New Target Date: 11/23/15)   Baseline 10/26/15: pt has a silver sneakers card, just does not use it 11/23/15-pt has not started community fitness program and limited participation with HEP   Status Not Met   PT LONG TERM GOAL #5   Title Pt will increase gait speed >2.62 ft/sec in order to indicate pt is functional community ambulator. (New Target Date: 11/23/15)   Baseline 2.79 ft/sec with rollator on 11/23/15   Status Achieved   PT LONG TERM GOAL #6   Title Pt will ambulate 500' over paved outdoor surfaces with rollator (including ramp and curb) at mod I level in order to indicate safety in community setting. (New  Target 11/23/15)   Baseline Pt only able to ambulate 340' with Rollator and 3L O2 before needing rest break and needed supervision/CGA for safety   Status Not Met   PT LONG TERM GOAL #7   Title Will assess FGA and increase score by 4 points in order to indicate decreased fall risk. (New target date: 11/23/15)   Baseline FGA not performed;pt with decline in DGI score to 14/24   Status Not Met              Remaining deficits: See above; pt with limited compliance with PT recommendations and with HEP therefore no significant progress was made with physical therapy.  Discussed concerns with pt and wife and provided community resources  (ACE, PACE) to wife.   Education / Equipment: HEP, adult day services  Plan: Patient agrees to discharge.  Patient goals were partially met. Patient is being discharged due to lack of progress.  ?????       Laureen Abrahams, PT, DPT 12/29/2015 11:15 AM  [facility] [address] [place], [place], [address] Phone: [phone]   Fax:  [phone]

## 2016-01-04 ENCOUNTER — Encounter: Payer: Self-pay | Admitting: Emergency Medicine

## 2016-01-04 ENCOUNTER — Ambulatory Visit (INDEPENDENT_AMBULATORY_CARE_PROVIDER_SITE_OTHER): Payer: Medicare Other | Admitting: Emergency Medicine

## 2016-01-04 VITALS — BP 110/69 | HR 75 | Temp 97.6°F | Resp 20 | Ht 66.0 in | Wt 217.8 lb

## 2016-01-04 DIAGNOSIS — H9193 Unspecified hearing loss, bilateral: Secondary | ICD-10-CM

## 2016-01-04 DIAGNOSIS — I639 Cerebral infarction, unspecified: Secondary | ICD-10-CM

## 2016-01-04 DIAGNOSIS — I10 Essential (primary) hypertension: Secondary | ICD-10-CM | POA: Diagnosis not present

## 2016-01-04 DIAGNOSIS — R0902 Hypoxemia: Secondary | ICD-10-CM | POA: Diagnosis not present

## 2016-01-04 DIAGNOSIS — Z8673 Personal history of transient ischemic attack (TIA), and cerebral infarction without residual deficits: Secondary | ICD-10-CM | POA: Diagnosis not present

## 2016-01-04 DIAGNOSIS — E1169 Type 2 diabetes mellitus with other specified complication: Secondary | ICD-10-CM

## 2016-01-04 DIAGNOSIS — I2699 Other pulmonary embolism without acute cor pulmonale: Secondary | ICD-10-CM

## 2016-01-04 LAB — COMPLETE METABOLIC PANEL WITH GFR
ALT: 50 U/L — AB (ref 9–46)
AST: 44 U/L — ABNORMAL HIGH (ref 10–35)
Albumin: 4.7 g/dL (ref 3.6–5.1)
Alkaline Phosphatase: 94 U/L (ref 40–115)
BUN: 18 mg/dL (ref 7–25)
CALCIUM: 9.8 mg/dL (ref 8.6–10.3)
CHLORIDE: 99 mmol/L (ref 98–110)
CO2: 22 mmol/L (ref 20–31)
CREATININE: 0.96 mg/dL (ref 0.70–1.18)
GFR, EST NON AFRICAN AMERICAN: 79 mL/min (ref >=60)
Glucose, Bld: 130 mg/dL — ABNORMAL HIGH (ref 65–99)
POTASSIUM: 4.5 mmol/L (ref 3.5–5.3)
Sodium: 138 mmol/L (ref 135–146)
Total Bilirubin: 0.4 mg/dL (ref 0.2–1.2)
Total Protein: 7.6 g/dL (ref 6.1–8.1)

## 2016-01-04 LAB — POCT GLYCOSYLATED HEMOGLOBIN (HGB A1C): HEMOGLOBIN A1C: 7.8

## 2016-01-04 LAB — GLUCOSE, POCT (MANUAL RESULT ENTRY): POC GLUCOSE: 143 mg/dL — AB (ref 70–99)

## 2016-01-04 MED ORDER — METFORMIN HCL ER 500 MG PO TB24: 500.0000 mg | ORAL_TABLET | Freq: Every day | ORAL | Status: DC

## 2016-01-04 NOTE — Patient Instructions (Signed)
I will call you when I get back the results of your blood test and let you know if it is safe for [name] to take the Glucophage

## 2016-01-04 NOTE — Progress Notes (Signed)
By signing my name below, I, Mesha Guinyard, attest that this documentation has been prepared under the direction and in the presence of [name], MD.  Electronically Signed: Verlee Monte, Medical Scribe. 01/04/2016. 10:16 AM.  Chief Complaint:  Chief Complaint  Patient presents with  . consulatation  . itching    HPI: [name] is a 70 y.o. male who reports to [facility] today complaining of itching and for consultation. Pt's wife mentions pt has some itchiness and took 25 mg of benadrill for relief, but finds he's drowsy on it. Pt's wife says he has had some allergic reaction to his drugs. Pt's wife reports pt has reflux and he couldn't breath at times, and was referred to nitroglycerin to manage his reflux. Pt's wife is concerned about erythema on his check. Pt's wife says he sees his doctors regularly. Pt's wife mentions his A1c levels have been up for a while. Pt's wife mentions he's on januvia in the morning and doesn't take metformin due to hx of kidney failure. Pt's wife mentions he goes to the [place] every 6 months. Pt's wife also takes him to see his gastroenterologists, podiatirst, and pulmonary doctor.Pt's wife mentions he's had 3 falls already. Pt's wife mentions pt has lost his right hearing aid. Pt's wife mentioned a build up of wax in pts left ear that needs to be cleaned out.   Past Medical History  Diagnosis Date  . Coronary artery disease     a. Low level exercise Lex MV 3/14: low risk, EF 62%, inf defect 2/2 diaph attenuation vs artifiact, small area of scar possible, no ischemia  . Hyperlipidemia   . Hypertension   . History of seizure disorder   . History of atrial flutter   . Depression   . Back pain     persistent  . Hearing loss   . Diverticulitis   . H/O alcohol abuse   . Diabetes mellitus     controled by diet  . GERD (gastroesophageal reflux disease)   . Seizures ([place])   . Headache(784.0)   . Arthritis     of spine  . Sleep apnea     does not use  CPAP  . Hx of echocardiogram     Echo 7/14:  Mod LVH, EF 55-60%, Gr 1 DD, mild MR, PASP 36  . AKI (acute kidney injury) ([place])     a. 6/14: resolved after d/c ARB;  b. RA U/S 7/14: no RA stenosis  . Atrial flutter ([place])   . Stroke ([place])   . Shortness of breath   . Renal stone 04/15/2014   Past Surgical History  Procedure Laterality Date  . Partial colectomy      for diverticuli  . Orchiectomy    . Umbilical hernia repair    . Cervical fusion    . Coronary artery bypass graft  2005    LIMA graft to LAD,saphenous vein graft to diag.,circumflex, marginal,and to the RCA  . Cardiac catheterization    . Appendectomy    . Esophagogastroduodenoscopy  02/10/2012    Procedure: ESOPHAGOGASTRODUODENOSCOPY (EGD);  Surgeon: Cleotis Nipper, MD;  Location: [facility] ENDOSCOPY;  Service: Endoscopy;  Laterality: N/A;  . Foreign body removal  02/10/2012    Procedure: FOREIGN BODY REMOVAL;  Surgeon: Cleotis Nipper, MD;  Location: [place];  Service: Endoscopy;  Laterality: N/A;  . Back surgery    . Esophagogastroduodenoscopy  06/29/2012    Procedure: ESOPHAGOGASTRODUODENOSCOPY (EGD);  Surgeon: Nancy Fetter  Brooke Bonito., MD;  Location: [facility] ENDOSCOPY;  Service: Endoscopy;  Laterality: N/A;  . Foreign body removal  06/29/2012    Procedure: FOREIGN BODY REMOVAL;  Surgeon: Winfield Cunas., MD;  Location: [facility] ENDOSCOPY;  Service: Endoscopy;  Laterality: N/A;  . Savory dilation  06/29/2012    Procedure: SAVORY DILATION;  Surgeon: Winfield Cunas., MD;  Location: [facility] ENDOSCOPY;  Service: Endoscopy;  Laterality: N/A;  . Tee without cardioversion N/A 11/17/2013    Procedure: TRANSESOPHAGEAL ECHOCARDIOGRAM (TEE);  Surgeon: Larey Dresser, MD;  Location: [facility];  Service: Cardiovascular;  Laterality: N/A;  . Cystoscopy/retrograde/ureteroscopy/stone extraction with basket Left 04/16/2014    Procedure: CYSTOSCOPY/RETROGRADE/LEFT URETEROSCOPY/STONE EXTRACTION WITH BASKET AND LASER LITHOTRIPSY;  Surgeon: Malka So,  MD;  Location: [facility] ORS;  Service: Urology;  Laterality: Left;  With STENT  . Left heart catheterization with coronary angiogram N/A 04/02/2012    Procedure: LEFT HEART CATHETERIZATION WITH CORONARY ANGIOGRAM;  Surgeon: Sinclair Grooms, MD;  Location: [facility] CATH LAB;  Service: Cardiovascular;  Laterality: N/A;  . Loop recorder implant N/A 11/17/2013    Procedure: LOOP RECORDER IMPLANT;  Surgeon: Deboraha Sprang, MD;  Location: [facility] CATH LAB;  Service: Cardiovascular;  Laterality: N/A;   Social History   Social History  . Marital Status: Married    Spouse Name: Clinical research associate  . Number of Children: 2  . Years of Education: college   Occupational History  . 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     Social History Main Topics  . Smoking status: Never Smoker   . Smokeless tobacco: Never Used  . Alcohol Use: No     Comment: rare beer  . Drug Use: No  . Sexual Activity: Not Currently   Other Topics Concern  . None   Social History Narrative   Patient lives at home with  wife      Patient drinks 2 cups of coffee a day.patient is left handed   Family History  Problem Relation Age of Onset  . Emphysema Mother     was a smoker  . Asthma Mother   . Heart disease Father   . Prostate cancer Paternal Grandfather   . Pancreatic cancer Paternal Uncle   . Heart attack Neg Hx   . Stroke Paternal Grandmother   . Hypertension Mother    Allergies  Allergen Reactions  . Cephalexin     Unknown  . Doxycycline     Unknown  . Keflex [Cephalexin]   . Lac Bovis Other (See Comments)    lactose intolerant  . Pentazocine Lactate     He passed out- he had a seizure.  This occurred around 2000  . Talwin [Pentazocine] Other (See Comments)    hallucinations  . Tape Other (See Comments)    Paper tape only please.  . Zanaflex [Tizanidine Hcl] Other (See Comments)    Lightheaded and dizzy   Prior to Admission medications   Medication Sig Start Date End Date Taking? Authorizing Provider  donepezil  (ARICEPT) 5 MG tablet Take 5 mg by mouth at bedtime.   Yes Historical Provider, MD  atorvastatin (LIPITOR) 80 MG tablet Take 1 tablet (80 mg total) by mouth daily. Patient taking differently: Take 80 mg by mouth at bedtime.  10/29/13   Gay Filler Copland, MD  ELIQUIS 5 MG TABS tablet TAKE 1 TABLET(5 MG) BY MOUTH TWICE DAILY 03/13/15   Rosalin Hawking, MD  fenofibrate (TRICOR) 145 MG tablet Take 1  tablet (145 mg total) by mouth daily. 04/05/15   Brittainy Erie Noe, PA-C  FLUoxetine (PROZAC) 10 MG capsule Take 30 mg by mouth every morning.    Historical Provider, MD  fluticasone (FLONASE) 50 MCG/ACT nasal spray Place 2 sprays into both nostrils daily.    Historical Provider, MD  furosemide (LASIX) 20 MG tablet TAKE 1-2 TABLETS BY MOUTH EVERY DAY AS NEEDED Patient taking differently: 40 mg DAILY in the morning (4am) 10/24/14   Peter M Martinique, MD  lamoTRIgine (LAMICTAL) 100 MG tablet Take 1 tablet (100 mg total) by mouth 2 (two) times daily. 10/03/15   Rosalin Hawking, MD  meclizine (ANTIVERT) 12.5 MG tablet Take 1 tablet (12.5 mg total) by mouth 2 (two) times daily as needed for dizziness. 03/31/15   Jaynee Eagles, PA-C  methocarbamol (ROBAXIN) 750 MG tablet Take 750 mg by mouth every 8 (eight) hours as needed for muscle spasms.  04/10/14   Historical Provider, MD  nitroGLYCERIN (NITROSTAT) 0.4 MG SL tablet Place 1 tablet (0.4 mg total) under the tongue every 5 (five) minutes as needed. For chest pain 03/10/15   Peter M Martinique, MD  ONE [facility] ULTRA TEST test strip USE TO TEST BLOOD SUGAR DAILY 07/11/15   Darlyne Russian, MD  [place] LANCETS FINE [place]  03/14/14   Historical Provider, MD  potassium citrate (UROCIT-K) 10 MEQ (1080 MG) SR tablet Take 10 mEq by mouth 3 (three) times daily with meals.  07/08/14   Historical Provider, MD  sitaGLIPtin (JANUVIA) 100 MG tablet Take 100 mg by mouth every morning. Reported on 09/19/2015    Historical Provider, MD  traZODone (DESYREL) 150 MG tablet Take 150 mg by mouth at bedtime.     Historical Provider, MD     ROS: The patient denies fevers, chills, night sweats, unintentional weight loss, chest pain, palpitations, wheezing, dyspnea on exertion, nausea, vomiting, abdominal pain, dysuria, hematuria, melena, numbness, weakness, or tingling.  All other systems have been reviewed and were otherwise negative with the exception of those mentioned in the HPI and as above.    PHYSICAL EXAM: Filed Vitals:   01/04/16 1007  BP: 110/69  Pulse: 75  Temp: 97.6 F (36.4 C)  Resp: 20   Body mass index is 35.17 kg/(m^2).  General: Alert, no acute distress. Chronic ill appearing on O2 is in not distress HEENT:  Normocephalic, atraumatic, oropharynx patent. Eye: Juliette Mangle [facility] Cardiovascular:  Regular rate and rhythm, no rubs murmurs or gallops.  No Carotid bruits, radial pulse intact. No pedal edema.  Respiratory: Clear to auscultation bilaterally.  No wheezes, rales, or rhonchi.  No cyanosis, no use of accessory musculature. His chest breath sounds are symmetrical Abdominal: No organomegaly, abdomen is soft and non-tender, positive bowel sounds.  No masses. Musculoskeletal: Gait intact. Extremities no edema, tenderness. Skin: No rashes. Neurologic: Facial musculature symmetric. Psychiatric: Patient acts appropriately throughout our interaction. Lymphatic: No cervical or submandibular lymphadenopathy  LABS: Results for orders placed or performed in visit on 01/04/16  POCT glucose (manual entry)  Result Value Ref Range   POC Glucose 143 (A) 70 - 99 mg/dl  POCT glycosylated hemoglobin (Hb A1C)  Result Value Ref Range   Hemoglobin A1C 7.8    EKG/XRAY:   Primary read interpreted by Dr. [name] at [facility].   ASSESSMENT/PLAN: Patient is stable. He receives most of his care through the [place] system . He is going to see Dr. Normajean Baxter for evaluation of his lung disease and pulmonary emboli. His hemoglobin A1c has  risen to 7.8. He was taken off of Glucophage when he had renal dysfunction.  He is currently only on Januvia. If kidney function and with liver function are back to normal he will start Glucophage.I personally performed the services described in this documentation, which was scribed in my presence. The recorded information has been reviewed and is accurate.   Gross sideeffects, risk and benefits, and alternatives of medications d/w patient. Patient is aware that all medications have potential sideeffects and we are unable to predict every sideeffect or drug-drug interaction that may occur.  [name] MD 01/04/2016 10:15 AM

## 2016-01-05 ENCOUNTER — Telehealth: Payer: Self-pay | Admitting: *Deleted

## 2016-01-05 NOTE — Telephone Encounter (Signed)
I called and left message stating the paperwork the pt wife was requesting is up front in the drawer.  It is in an envelope with patients first and last name.

## 2016-01-11 ENCOUNTER — Ambulatory Visit (INDEPENDENT_AMBULATORY_CARE_PROVIDER_SITE_OTHER): Payer: Medicare Other | Admitting: *Deleted

## 2016-01-11 DIAGNOSIS — I639 Cerebral infarction, unspecified: Secondary | ICD-10-CM

## 2016-01-12 NOTE — Progress Notes (Signed)
Carelink Summary Report / Loop Recorder 

## 2016-01-18 ENCOUNTER — Ambulatory Visit: Payer: Medicare Other | Admitting: Emergency Medicine

## 2016-01-19 LAB — CUP PACEART REMOTE DEVICE CHECK: Date Time Interrogation Session: 20170319063613

## 2016-01-22 LAB — CUP PACEART REMOTE DEVICE CHECK: MDC IDC SESS DTM: 20170418070701

## 2016-01-22 NOTE — Progress Notes (Signed)
Carelink summary report received. Battery status OK. Normal device function. No new symptom episodes, tachy episodes, brady, or pause episodes. No new AF episodes. Monthly summary reports and ROV/PRN 

## 2016-01-23 ENCOUNTER — Ambulatory Visit: Payer: Medicare Other | Admitting: Neurology

## 2016-01-23 ENCOUNTER — Encounter (INDEPENDENT_AMBULATORY_CARE_PROVIDER_SITE_OTHER): Payer: Self-pay | Admitting: Neurology

## 2016-01-23 DIAGNOSIS — G40909 Epilepsy, unspecified, not intractable, without status epilepticus: Secondary | ICD-10-CM

## 2016-01-23 DIAGNOSIS — I63421 Cerebral infarction due to embolism of right anterior cerebral artery: Secondary | ICD-10-CM

## 2016-01-23 NOTE — Progress Notes (Signed)
Pt came in for [facility] research visit.   Pt was last seen on 09/19/15. He was continued on eliquis for PE treatment and stroke prevention without side effect or bleeding. He is also on lamictal for seizure treatment. Currently at 159m bid and as per wife, pt has not had any seizure like activity since the change from keppra to lamictal. Pt continued to follow up with cardiology and pulmonary. BP and glucose controlled well. Has PT/OT once a week but not actively doing home exercise. Otherwise, no complains.  He will continue eliquis and lipitor for stroke prevention. Continue lamicatal for seizure control. Encourage for home exercise. Follow up with cards and pulmonary. Will see him again in 05/2016.  JRosalin Hawking MD PhD Stroke Neurology 01/23/2016 5:10 PM

## 2016-02-02 ENCOUNTER — Telehealth: Payer: Self-pay | Admitting: Cardiology

## 2016-02-02 NOTE — Telephone Encounter (Signed)
Spoke w/ pt wife about disconnected monitor and she informed me that MD said they could unplug monitor. According to OV note from 12-04-2015 MD stated that we would stop follow loop recorder.

## 2016-02-09 ENCOUNTER — Encounter: Payer: Self-pay | Admitting: Cardiology

## 2016-02-12 ENCOUNTER — Ambulatory Visit (INDEPENDENT_AMBULATORY_CARE_PROVIDER_SITE_OTHER): Payer: Medicare Other | Admitting: *Deleted

## 2016-02-12 DIAGNOSIS — I639 Cerebral infarction, unspecified: Secondary | ICD-10-CM

## 2016-02-12 NOTE — Progress Notes (Signed)
Carelink Summary Report / Loop Recorder 

## 2016-02-19 LAB — CUP PACEART REMOTE DEVICE CHECK
Date Time Interrogation Session: 20170518070708
Date Time Interrogation Session: 20170617070605

## 2016-02-20 DIAGNOSIS — F0390 Unspecified dementia without behavioral disturbance: Secondary | ICD-10-CM | POA: Diagnosis not present

## 2016-02-20 DIAGNOSIS — E291 Testicular hypofunction: Secondary | ICD-10-CM | POA: Diagnosis not present

## 2016-02-20 DIAGNOSIS — Z87442 Personal history of urinary calculi: Secondary | ICD-10-CM | POA: Diagnosis not present

## 2016-02-23 ENCOUNTER — Encounter: Payer: Self-pay | Admitting: Cardiology

## 2016-03-11 ENCOUNTER — Encounter: Payer: Medicare Other | Admitting: *Deleted

## 2016-05-05 DIAGNOSIS — R300 Dysuria: Secondary | ICD-10-CM | POA: Diagnosis not present

## 2016-07-25 DIAGNOSIS — N200 Calculus of kidney: Secondary | ICD-10-CM | POA: Diagnosis not present

## 2016-07-25 DIAGNOSIS — N3941 Urge incontinence: Secondary | ICD-10-CM | POA: Diagnosis not present

## 2016-07-25 DIAGNOSIS — R351 Nocturia: Secondary | ICD-10-CM | POA: Diagnosis not present

## 2016-08-21 ENCOUNTER — Telehealth: Payer: Self-pay | Admitting: Neurology

## 2016-08-21 NOTE — Telephone Encounter (Signed)
I spoke to pt's wife. Pt's wife reports that pt's sleep care has been transferred to the [place] since he is 100% disabled. Pt's wife says that Dr. Rexene Alberts instructed pt to also use his oxygen at night with his bipap. They need a letter reflecting this sent to the [place]. I reviewed Dr. Guadelupe Sabin last note for pt which mentions that Dr. [name] placed pt on oxygen. I cannot find Dr. Gustavus Bryant note, nor anywhere in Dr. Guadelupe Sabin note, that mentions that he should be on nocturnal oxygen with bipap.  Dr. Rexene Alberts, are you agreeable to a letter for this pt to the [place] that says that pt should be on bipap and oxygen nocturnally? Pt's wife insists that you told them to have the pt on nocturnal oxygen.

## 2016-08-21 NOTE — Telephone Encounter (Signed)
I called pt back, wife answered, but then the call dropped. I called pt back, but it went to VM. Left a message asking pt/pt's wife to call me back.  I need to know what the letter needs to say. I am unsure of why he needs a letter for oxygen. Dr. [name] is the pulmonologist and he should most likely write that letter for him. If he needs a letter for bipap use, we need to know what this letter needs to say.

## 2016-08-21 NOTE — Telephone Encounter (Signed)
Pts wife Mardene Celeste returning Kristen's call.

## 2016-08-21 NOTE — Telephone Encounter (Signed)
Patient needs to be re-certified for Oxygen therapy through [place]. Was started on O2 per my chart review in 2015 per pulmonology. When we check an ONO with BiPAP, he did not qualify for suppl O2.

## 2016-08-21 NOTE — Telephone Encounter (Signed)
Pts wife Mardene Celeste called needing a letter in reference to patients O2 and a copy of patient sleep study results.  Patient will be doing services at the [place] now for sleep.  Fax # for [facility] [place]- [phone] Billow.

## 2016-08-21 NOTE — Telephone Encounter (Signed)
I called pt's wife back. No answer, left a message asking her to call me back.

## 2016-08-21 NOTE — Telephone Encounter (Signed)
Spoke with patients wife Mardene Celeste stated we did not prescribe oxygen, but Dr. Rexene Alberts seen patient was on it and wanted patient to continue use with his bipap machine.  Please call

## 2016-08-22 NOTE — Telephone Encounter (Signed)
I spoke to pt's wife and advised her that Dr. Rexene Alberts cannot write the letter for pt's oxygen but recommends that hi be re-certified for oxygen therapy through the [facility]. Pt should also speak to his pulmonologist to see if they can provide the needed letter. Pt's wife verbalized understanding. Pt's wife is asking that the sleep studies be sent to the [place]. I advised her that I would send this request to MR.

## 2016-08-28 DIAGNOSIS — R404 Transient alteration of awareness: Secondary | ICD-10-CM | POA: Diagnosis not present

## 2016-08-28 DIAGNOSIS — R531 Weakness: Secondary | ICD-10-CM | POA: Diagnosis not present

## 2016-10-08 ENCOUNTER — Ambulatory Visit: Payer: Medicare Other | Admitting: Neurology

## 2016-11-12 DIAGNOSIS — I10 Essential (primary) hypertension: Secondary | ICD-10-CM | POA: Diagnosis not present

## 2016-11-12 DIAGNOSIS — Z8673 Personal history of transient ischemic attack (TIA), and cerebral infarction without residual deficits: Secondary | ICD-10-CM | POA: Diagnosis not present

## 2016-11-12 DIAGNOSIS — E119 Type 2 diabetes mellitus without complications: Secondary | ICD-10-CM | POA: Diagnosis not present

## 2016-11-12 DIAGNOSIS — R29898 Other symptoms and signs involving the musculoskeletal system: Secondary | ICD-10-CM | POA: Diagnosis not present

## 2016-11-14 DIAGNOSIS — Z8673 Personal history of transient ischemic attack (TIA), and cerebral infarction without residual deficits: Secondary | ICD-10-CM | POA: Diagnosis not present

## 2016-11-14 DIAGNOSIS — E119 Type 2 diabetes mellitus without complications: Secondary | ICD-10-CM | POA: Diagnosis not present

## 2016-11-14 DIAGNOSIS — E782 Mixed hyperlipidemia: Secondary | ICD-10-CM | POA: Diagnosis not present

## 2016-11-14 DIAGNOSIS — I10 Essential (primary) hypertension: Secondary | ICD-10-CM | POA: Diagnosis not present

## 2016-11-19 DIAGNOSIS — I10 Essential (primary) hypertension: Secondary | ICD-10-CM | POA: Diagnosis not present

## 2016-11-19 DIAGNOSIS — E119 Type 2 diabetes mellitus without complications: Secondary | ICD-10-CM | POA: Diagnosis not present

## 2016-11-19 DIAGNOSIS — Z8673 Personal history of transient ischemic attack (TIA), and cerebral infarction without residual deficits: Secondary | ICD-10-CM | POA: Diagnosis not present

## 2016-11-21 DIAGNOSIS — E119 Type 2 diabetes mellitus without complications: Secondary | ICD-10-CM | POA: Diagnosis not present

## 2016-11-21 DIAGNOSIS — I10 Essential (primary) hypertension: Secondary | ICD-10-CM | POA: Diagnosis not present

## 2016-11-21 DIAGNOSIS — Z8673 Personal history of transient ischemic attack (TIA), and cerebral infarction without residual deficits: Secondary | ICD-10-CM | POA: Diagnosis not present

## 2016-11-21 DIAGNOSIS — R29898 Other symptoms and signs involving the musculoskeletal system: Secondary | ICD-10-CM | POA: Diagnosis not present

## 2016-11-28 DIAGNOSIS — I69152 Hemiplegia and hemiparesis following nontraumatic intracerebral hemorrhage affecting left dominant side: Secondary | ICD-10-CM | POA: Diagnosis not present

## 2016-11-28 DIAGNOSIS — R9082 White matter disease, unspecified: Secondary | ICD-10-CM | POA: Diagnosis not present

## 2016-11-28 DIAGNOSIS — N189 Chronic kidney disease, unspecified: Secondary | ICD-10-CM | POA: Diagnosis not present

## 2016-11-28 DIAGNOSIS — I10 Essential (primary) hypertension: Secondary | ICD-10-CM | POA: Diagnosis not present

## 2016-11-28 DIAGNOSIS — Z91048 Other nonmedicinal substance allergy status: Secondary | ICD-10-CM | POA: Diagnosis not present

## 2016-11-28 DIAGNOSIS — Z951 Presence of aortocoronary bypass graft: Secondary | ICD-10-CM | POA: Diagnosis not present

## 2016-11-28 DIAGNOSIS — Z7984 Long term (current) use of oral hypoglycemic drugs: Secondary | ICD-10-CM | POA: Diagnosis not present

## 2016-11-28 DIAGNOSIS — R410 Disorientation, unspecified: Secondary | ICD-10-CM | POA: Diagnosis not present

## 2016-11-28 DIAGNOSIS — I129 Hypertensive chronic kidney disease with stage 1 through stage 4 chronic kidney disease, or unspecified chronic kidney disease: Secondary | ICD-10-CM | POA: Diagnosis not present

## 2016-11-28 DIAGNOSIS — I6789 Other cerebrovascular disease: Secondary | ICD-10-CM | POA: Diagnosis not present

## 2016-11-28 DIAGNOSIS — Z881 Allergy status to other antibiotic agents status: Secondary | ICD-10-CM | POA: Diagnosis not present

## 2016-11-28 DIAGNOSIS — I2510 Atherosclerotic heart disease of native coronary artery without angina pectoris: Secondary | ICD-10-CM | POA: Diagnosis not present

## 2016-11-28 DIAGNOSIS — Z79899 Other long term (current) drug therapy: Secondary | ICD-10-CM | POA: Diagnosis not present

## 2016-11-28 DIAGNOSIS — Z7901 Long term (current) use of anticoagulants: Secondary | ICD-10-CM | POA: Diagnosis not present

## 2016-11-28 DIAGNOSIS — E1122 Type 2 diabetes mellitus with diabetic chronic kidney disease: Secondary | ICD-10-CM | POA: Diagnosis not present

## 2016-11-28 DIAGNOSIS — R918 Other nonspecific abnormal finding of lung field: Secondary | ICD-10-CM | POA: Diagnosis not present

## 2016-11-28 DIAGNOSIS — Z91011 Allergy to milk products: Secondary | ICD-10-CM | POA: Diagnosis not present

## 2016-11-28 DIAGNOSIS — R531 Weakness: Secondary | ICD-10-CM | POA: Diagnosis not present

## 2016-11-28 DIAGNOSIS — I69311 Memory deficit following cerebral infarction: Secondary | ICD-10-CM | POA: Diagnosis not present

## 2016-11-28 DIAGNOSIS — E119 Type 2 diabetes mellitus without complications: Secondary | ICD-10-CM | POA: Diagnosis not present

## 2016-11-28 DIAGNOSIS — Z888 Allergy status to other drugs, medicaments and biological substances status: Secondary | ICD-10-CM | POA: Diagnosis not present

## 2016-12-10 DIAGNOSIS — E119 Type 2 diabetes mellitus without complications: Secondary | ICD-10-CM | POA: Diagnosis not present

## 2016-12-10 DIAGNOSIS — H2511 Age-related nuclear cataract, right eye: Secondary | ICD-10-CM | POA: Diagnosis not present

## 2016-12-10 DIAGNOSIS — H04123 Dry eye syndrome of bilateral lacrimal glands: Secondary | ICD-10-CM | POA: Diagnosis not present

## 2016-12-10 DIAGNOSIS — Z961 Presence of intraocular lens: Secondary | ICD-10-CM | POA: Diagnosis not present

## 2016-12-23 ENCOUNTER — Encounter: Payer: Self-pay | Admitting: Neurology

## 2016-12-23 ENCOUNTER — Ambulatory Visit (INDEPENDENT_AMBULATORY_CARE_PROVIDER_SITE_OTHER): Payer: Medicare Other | Admitting: Neurology

## 2016-12-23 VITALS — BP 117/66 | HR 71 | Wt 214.0 lb

## 2016-12-23 DIAGNOSIS — G4733 Obstructive sleep apnea (adult) (pediatric): Secondary | ICD-10-CM

## 2016-12-23 DIAGNOSIS — I63421 Cerebral infarction due to embolism of right anterior cerebral artery: Secondary | ICD-10-CM

## 2016-12-23 DIAGNOSIS — G40909 Epilepsy, unspecified, not intractable, without status epilepticus: Secondary | ICD-10-CM

## 2016-12-23 NOTE — Patient Instructions (Addendum)
-   continue eliquis and lipitor for stroke prevention - restart ASA 20m for cardiac prevention - continue lamictal 1061mtwice a day.  - continue follow with Dr. JoMartiniquen cardiology. - Follow up with your primary care physician for stroke risk factor modification. Recommend maintain blood pressure goal <130/80, diabetes with hemoglobin A1c goal below 6.5% and lipids with LDL cholesterol goal below 70 mg/dL.  - check BP and glucose at home. - [facility] We may need to order extra home PT/OT if needed. - follow up in 4 months.

## 2016-12-23 NOTE — Progress Notes (Signed)
STROKE NEUROLOGY FOLLOW UP NOTE  NAME: [name] DOB: 1946-08-25  REASON FOR VISIT: stroke follow up HISTORY FROM: wife and chart  Today we had the pleasure of seeing [name] in follow-up at our Neurology Clinic. [name] was accompanied by wife.   History Summary [name] is a 71 yo male with PMH of CAD s/p CABG in 2005, HTN, DM, HLD, OSA on CPAP, seizure disorder on keppra in the past, chronic diastolic CHF on chronic home O2 was admitted on 11/14/13 for staring spell and left leg weakness and falling. MRI showed acute right ACA stroke. MRA showed no large vessel stenosis and carotid Doppler were unremarkable. Stroke workup including TTE and TEE were negative, and [name] had loop recorder placed. [name] was put on aspirin for stroke prevention.   Subsequent loop recording did not show afib, and [name] was enrolled to RESPECT ESUS study. However, [name] was taken off the trial in 05/2014 due to PE which required anticoagulation with coumadin. EMG study in 04/2014 showed right S1 radiculopathy with some involvement at L3 or L4 level. [name] continued to follow up in our clinic for stroke and loop recorder did not show afib. 03/2014 CTA head and neck showed intracranial stenosis with 70% right supraclinoid ICA stenosis.  On 02/11/15, [name] had episode of staring again, not responding to wife, EMS called, on arrival [name] staring resolved. After EMS left, [name] had send episode of staring and not responding, wife called EMS back, and [name] was sent to [facility]. In ED, his staring seemed resolved but still has speech difficulty. INR 2.07 on admission. 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 His coumadin changed to eliquis and continued on ASA for cardiac prevention. A1C 7.1 and lipid panel showed high TG and LDL not able to be calculated. [name] was continued on lipitor and also put on Tricor.   In 02/2015, [name] had another episode of staring spell and  resolved in 5-10min, [name] was seen in ER and complain of dizziness and was told to have vertigo. MRI done showed similar findings as in 01/2015, concerning for either blood product from prior infarct or laminar necrosis. [name] and was told to have vertigo was continued on eliquis.  Follow up 07/10/15 - the [name] has been doing the same. No more staring spells. Continued on eliquis and ASA without side effects. Loop recorder did not show afib or aflutter so far. [name] is still on chronic home O2. As per wife, [name] had seizure disorder in the past, not sure about EEG findings but was put on keppra for a while. [name] still has LLE weakness and currently walk with walker. BP 101/68 in clinic.    Follow-up 09/19/15 - [name] has been doing well from stroke standpoint. No stroke recurrence or seizure like activity. Had EEG done showed normal EEG. [name] not able to tolerate Keppra which was then discontinued. Switched to lamictal with slow titration. Currently on 54m bid. Glucose not in good control at home but BP is under control. Today BP in clinic 127/74. As per wife, [name] has insomnia with frequent urination at hight, intermittent urinary incontinence. [name] has appointment with urology this week.   Interval History During the interval time, [name] has been followed by Mr. Department, continued on eliquis and Lamictal. About 2 months ago, wife was taking respite time for 10 days and [name] was taken care of by [facility] at that time.  As per wife, [name] was given overdosed trazodone due to confusion of dosage, as well as no O2 for CPAP at night. [name] was not active during the day and [name] lost much ability to walk when wife came back from respite service. [name] got [name]/OT from [place] service later but still in wheelchair now and not able to walk with walker well as before. BP today 117/64.   REVIEW OF SYSTEMS: Full 14 system review of systems performed and notable only for those listed below and in HPI above, all others are negative:  Constitutional:   Activity change, excessive sweating Cardiovascular:  Ear/Nose/Throat:  Hearing loss, ringing years, runny nose, drooling Skin: Rash, itching Eyes:   Respiratory:   Gastroitestinal:  Diarrhea Genitourinary:  Hematology/Lymphatic:   Endocrine: Cold intolerance, heat intolerance Musculoskeletal:  Joint pain, back pain, walking difficulty Allergy/Immunology:   Neurological:  Memory loss, headache, weakness Psychiatric: Agitation, behavior problem, confusion, depression, nervousness, anxious Sleep: Insomnia, apnea, daytime sleepiness  The following represents the [name]'s updated allergies and side effects list: Allergies  Allergen Reactions  . Cephalexin     Unknown  . Doxycycline     Unknown  . Keflex [Cephalexin]   . Lac Bovis Other (See Comments)    lactose intolerant  . Pentazocine Lactate     [name] passed out- [name] had a seizure.  This occurred around 2000  . Talwin [Pentazocine] Other (See Comments)    hallucinations  . Tape Other (See Comments)    Paper tape only please.  . Zanaflex [Tizanidine Hcl] Other (See Comments)    Lightheaded and dizzy    The neurologically relevant items on the [name]'s problem list were reviewed on today's visit.  Neurologic Examination  A problem focused neurological exam (12 or more points of the single system neurologic examination, vital signs counts as 1 point, cranial nerves count for 8 points) was performed.  Blood pressure 117/66, pulse 71, weight 214 lb (97.1 kg).  General - Well nourished, well developed, in no apparent distress, but lethargic.  Ophthalmologic - Fundi not visualized due to noncooperation.  Cardiovascular - Regular rate and rhythm.  Mental Status -  Level of arousal and orientation to place, and person were intact, not orientated to time. Language including expression, naming, repetition, comprehension was assessed and found intact. Fund of Knowledge was assessed and was impaired.  Cranial Nerves II - XII  - II - Visual field intact OU. III, IV, VI - Extraocular movements intact. V - Facial sensation intact bilaterally. VII - Facial movement decreased bilaterally, with drooling, but able to smile, symmetric. VIII - Hearing & vestibular intact bilaterally. X - Palate elevates symmetrically. XI - Chin turning & shoulder shrug intact bilaterally. XII - Tongue protrusion intact.  Motor Strength - The [name]'s strength was 4/5 in all extremities and pronator drift was absent.  Bulk was normal and fasciculations were absent.   Motor Tone - Muscle tone was assessed at the neck and appendages and was normal.  Reflexes - The [name]'s reflexes were 1+ in all extremities and [name] had no pathological reflexes.  Sensory - Light touch, temperature/pinprick were assessed and were normal.    Coordination - The [name] had normal movements in the hands with no ataxia or dysmetria.  Tremor was absent.  Gait and Station - in wheelchair, not tested  Data reviewed: I personally reviewed the images and agree with the radiology interpretations.  MRI and MRA 11/14/13 - Subacute infarction in the right frontal lobe. Swelling, laminar necrosis and  low level blood product deposition. No frank hematoma. Older right frontal cortical infarction adjacent to that. Chronic small vessel changes elsewhere throughout the brain. Distal vessel atherosclerotic changes diffusely. The right anterior cerebral artery is patent but is smaller than the left.  MRI and MRA 02/11/15 Chronic right ACA infarct in the right frontal lobe. There is now a small area of acute infarct in the right frontal cortex over the convexity compatible with extension of infarction. Atrophy and chronic microvascular ischemic changes. Chronic infarct left pons has developed since 2015. Severe stenosis left posterior cerebral artery is unchanged from the prior study. Right anterior cerebral artery is widely patent.  MRI 03/07/15 No significant  change from 02/11/2015. Chronic hemorrhagic infarct right frontal lobe with area of restricted diffusion compatible with acute or subacute infarct along the superior anterior margin of the chronic infarct. Atrophy and chronic microvascular ischemia.  MRI 08/04/15 Abnormal MRI scan of the brain showing remote age large left frontal infarcts with encephalomalacia, gliosis, laminar necrosis as well as small left pontine remote age lacunar infarct. Persistent diffusion-positive signal in the right frontal cortex likely represents laminar necrosis as compared with previous MRI scan dated 02/11/2015 no significant changes are noted  CTA head and neck 04/01/2014 -  1. Right ACA occlusion in the distal A2 segment just beyond the frontopolar artery. Expected evolution of the right ACA infarct which occurred in March. 2. Severe right ICA supraclinoid segment atherosclerotic stenosis, numerically estimated at 70%. 3. Additional bilateral ICA siphon calcified plaque, and bilateral cervical carotid calcified plaque, without additional hemodynamically significant stenosis. 4. Moderate stenosis at the left vertebral artery origin. Mild vertebral plaque otherwise.  CUS 02/13/15 -  - Technically difficult due to respiratory interference and high  bifurcations - Bilateral - 1% to 39% ICA stenosis. Vertebral artery flow is  antegrade.  TTE 12/02/14  - Left ventricle: The cavity size was normal. Wall thickness was normal. Systolic function was normal. The estimated ejection fraction was in the range of 55% to 60%. Doppler parameters are consistent with abnormal left ventricular relaxation (grade 1 diastolic dysfunction). - Mitral valve: Calcified annulus. Mildly thickened leaflets . - Pulmonary arteries: PA peak pressure: 34 mm Hg (S).  Nuclear stress test 04/25/15  The left ventricular ejection fraction is hyperdynamic (>65%).  Nuclear stress EF: 71%.  There was no ST segment deviation  noted during stress.  This is a low risk study.  Loop recorder - no afib so far  EMG/NCS 05/10/14 - Nerve conduction studies done on both lower extremities were unremarkable, without evidence of a peripheral neuropathy. EMG evaluation of the right lower extremity shows findings that are most consistent with a low-grade acute S1 radiculopathy, with some involvement as well at the L3 or L4 level. EMG evaluation of the left lower extremity does not show clear evidence of a lumbosacral radiculopathy.  EEG 08/23/15 - Normal EEG in the awake state.   Component     Latest Ref Rng 02/12/2015  Cholesterol     0 - 200 mg/dL 195  Triglycerides     0 - 149 mg/dL 431 (H)  HDL Cholesterol     >39 mg/dL 33 (L)  Total CHOL/HDL Ratio     0.0 - 5.0 ratio units 5.9  VLDL     0 - 40 mg/dL UNABLE TO CALCULATE IF TRIGLYCERIDE OVER 400 mg/dL  LDL (calc)     0 - 99 mg/dL UNABLE TO CALCULATE IF TRIGLYCERIDE OVER 400 mg/dL  Cholesterol, Total  100 - 199 mg/dL   VLDL Cholesterol Cal     5 - 40 mg/dL   Hemoglobin A1C     4.8 - 5.6 % 7.1 (H)  Mean Plasma Glucose      157   Component     Latest Ref Rng 03/31/2015  Cholesterol     0 - 200 mg/dL   Triglycerides     0 - 149 mg/dL 559 (HH)  HDL Cholesterol     >39 mg/dL 34 (L)  Total CHOL/HDL Ratio     0.0 - 5.0 ratio units 5.5 (H)  VLDL     0 - 40 mg/dL   LDL (calc)     0 - 99 mg/dL Comment  Cholesterol, Total     100 - 199 mg/dL 187  VLDL Cholesterol Cal     5 - 40 mg/dL Comment  Hemoglobin A1C     4.8 - 5.6 %   Mean Plasma Glucose          Assessment: As you may recall, [name] is a 71 y.o. Caucasian male with PMH of CAD s/p CABG in 2005, HTN, DM, HLD, OSA on CPAP, seizure disorder on keppra in the past, chronic diastolic CHF on chronic home O2 was admitted on 11/14/13 for acute right ACA stroke. MRA showed no large vessel stenosis and carotid Doppler were unremarkable. TTE and TEE were negative, and loop recorder placed. Subsequent [name] was  enrolled to RESPECT ESUS study. However, [name] was taken off the trial in 05/2014 due to PE which required anticoagulation with coumadin. 03/2014 CTA head and neck showed intracranial stenosis with 70% right supraclinoid ICA stenosis. On 02/11/15, [name] was admitted again for 2 episodes of staring, not responding to wife, INR 2.07 on admission. 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 His coumadin changed to eliquis and continued on ASA for cardiac prevention. In 02/2015, [name] had another episode of staring spell lasting 5-18mn. MRI done showed similar findings as in 01/2015, concerning for either blood product from prior infarct or laminar necrosis. [name] was continued on eliquis.   From the description of staring spells, and his large right ACA infarct in the past, the episodes in June and July are concerning for seizure episodes, instead of stroke. Especially, the MRIs repeat in June and July do not support new stroke but more laminar necrosis which was confirmed by MRI in 07/2015. EEG normal study. [name] not tolerating keppra due to side effects, and was put on lamictal and titrated to 1026mbid. Had significant deconditioning after wife had 10 day respite service, has to use wheelchair, not able to walk with walker now, currently on home [name]/OT.   Plan:  - continue eliquis and lipitor for stroke prevention - restart ASA 8164mor cardiac prevention - continue lamictal 100m25mice a day.  - continue follow with Dr. JordMartiniquecardiology. - Follow up with your primary care physician for stroke risk factor modification. Recommend maintain blood pressure goal <130/80, diabetes with hemoglobin A1c goal below 6.5% and lipids with LDL cholesterol goal below 70 mg/dL.  - check BP and glucose at home. - [name] We may need to order extra home [name]/OT if needed. - follow up in 4 months.  A total of 25 minutes was spent face-to-face  with this [name]. This is a medically very complicated case and over half this time was spent on counseling [name] on  deconditioning and continue [name]/OT.    No orders of the defined types were placed in this encounter.   Meds ordered this encounter  Medications  . DISCONTD: nitroGLYCERIN (NITROSTAT) 0.4 MG SL tablet    Sig: Place under the tongue.  Marland Kitchen apixaban (ELIQUIS) 5 MG TABS tablet    Sig: TAKE 1 TABLET(5 MG) BY MOUTH TWICE DAILY  . DISCONTD: lamoTRIgine (LAMICTAL) 100 MG tablet    Sig: Take by mouth.  . DISCONTD: pregabalin (LYRICA) 50 MG capsule    Sig: Take 50 mg by mouth.  . mirtazapine (REMERON) 7.5 MG tablet    Sig: Take 7.5 mg by mouth.  . DISCONTD: FLUoxetine (PROZAC) 10 MG capsule    Sig: Take by mouth.  Marland Kitchen glipiZIDE (GLUCOTROL) 5 MG tablet    Sig: Take 2.5 mg by mouth 2 (two) times daily before a meal.   . DISCONTD: sitaGLIPtin (JANUVIA) 100 MG tablet    Sig: Take by mouth.  Marland Kitchen gemfibrozil (LOPID) 600 MG tablet    Sig: Take 600 mg by mouth.   . DISCONTD: atorvastatin (LIPITOR) 80 MG tablet    Sig: Take by mouth.  . ARIPiprazole (ABILIFY) 5 MG tablet    Sig: Take by mouth.  . DISCONTD: fluticasone (FLONASE) 50 MCG/ACT nasal spray    Sig: Place into the nose.  Marland Kitchen DISCONTD: furosemide (LASIX) 20 MG tablet  . potassium citrate (UROCIT-K) 10 MEQ (1080 MG) SR tablet    Sig: Take by mouth.  . DISCONTD: donepezil (ARICEPT) 5 MG tablet    Sig: Take by mouth.  . OMEPRAZOLE PO    Sig: Take 20 mg by mouth.  Marland Kitchen aspirin EC 81 MG tablet    Sig: Take 1 tablet (81 mg total) by mouth daily.    [name] Instructions  - continue eliquis and lipitor for stroke prevention - restart ASA 68m for cardiac prevention - continue lamictal 1060mtwice a day.  - continue follow with Dr. JoMartiniquen cardiology. - Follow up with your primary care physician for stroke risk factor modification. Recommend maintain blood pressure goal <130/80, diabetes with hemoglobin A1c goal below 6.5% and  lipids with LDL cholesterol goal below 70 mg/dL.  - check BP and glucose at home. - [name]. We may need to order extra home [name]/OT if needed. - follow up in 4 months.   JiRosalin HawkingMD PhD [facility] [id number] [phone]

## 2016-12-24 MED ORDER — ASPIRIN EC 81 MG PO TBEC: 81.0000 mg | DELAYED_RELEASE_TABLET | Freq: Every day | ORAL | Status: DC

## 2017-01-23 DIAGNOSIS — N3941 Urge incontinence: Secondary | ICD-10-CM | POA: Diagnosis not present

## 2017-01-23 DIAGNOSIS — N200 Calculus of kidney: Secondary | ICD-10-CM | POA: Diagnosis not present

## 2017-01-23 DIAGNOSIS — R351 Nocturia: Secondary | ICD-10-CM | POA: Diagnosis not present

## 2017-03-05 ENCOUNTER — Emergency Department (HOSPITAL_BASED_OUTPATIENT_CLINIC_OR_DEPARTMENT_OTHER)
Admission: EM | Admit: 2017-03-05 | Discharge: 2017-03-05 | Disposition: A | Discharge status: Home / Self Care | Payer: Medicare Other | Attending: Emergency Medicine | Admitting: Emergency Medicine

## 2017-03-05 ENCOUNTER — Encounter (HOSPITAL_BASED_OUTPATIENT_CLINIC_OR_DEPARTMENT_OTHER): Payer: Self-pay | Admitting: Emergency Medicine

## 2017-03-05 ENCOUNTER — Emergency Department (HOSPITAL_BASED_OUTPATIENT_CLINIC_OR_DEPARTMENT_OTHER): Payer: Medicare Other

## 2017-03-05 DIAGNOSIS — Z7982 Long term (current) use of aspirin: Secondary | ICD-10-CM | POA: Insufficient documentation

## 2017-03-05 DIAGNOSIS — Z7984 Long term (current) use of oral hypoglycemic drugs: Secondary | ICD-10-CM | POA: Insufficient documentation

## 2017-03-05 DIAGNOSIS — E119 Type 2 diabetes mellitus without complications: Secondary | ICD-10-CM | POA: Insufficient documentation

## 2017-03-05 DIAGNOSIS — N200 Calculus of kidney: Secondary | ICD-10-CM | POA: Diagnosis not present

## 2017-03-05 DIAGNOSIS — Z79899 Other long term (current) drug therapy: Secondary | ICD-10-CM | POA: Diagnosis not present

## 2017-03-05 DIAGNOSIS — I2510 Atherosclerotic heart disease of native coronary artery without angina pectoris: Secondary | ICD-10-CM | POA: Diagnosis not present

## 2017-03-05 DIAGNOSIS — I10 Essential (primary) hypertension: Secondary | ICD-10-CM | POA: Diagnosis not present

## 2017-03-05 DIAGNOSIS — N201 Calculus of ureter: Secondary | ICD-10-CM

## 2017-03-05 DIAGNOSIS — Z951 Presence of aortocoronary bypass graft: Secondary | ICD-10-CM | POA: Diagnosis not present

## 2017-03-05 DIAGNOSIS — Z8673 Personal history of transient ischemic attack (TIA), and cerebral infarction without residual deficits: Secondary | ICD-10-CM | POA: Diagnosis not present

## 2017-03-05 DIAGNOSIS — R109 Unspecified abdominal pain: Secondary | ICD-10-CM | POA: Diagnosis present

## 2017-03-05 LAB — URINALYSIS, ROUTINE W REFLEX MICROSCOPIC
Bilirubin Urine: NEGATIVE
GLUCOSE, UA: NEGATIVE mg/dL
Ketones, ur: NEGATIVE mg/dL
Leukocytes, UA: NEGATIVE
NITRITE: NEGATIVE
PH: 5 (ref 5.0–8.0)
Protein, ur: 100 mg/dL — AB
Specific Gravity, Urine: 1.027 (ref 1.005–1.030)

## 2017-03-05 LAB — CBC WITH DIFFERENTIAL/PLATELET
BASOS ABS: 0 10*3/uL (ref 0.0–0.1)
Basophils Relative: 0 %
EOS PCT: 1 %
Eosinophils Absolute: 0.1 10*3/uL (ref 0.0–0.7)
HEMATOCRIT: 34.8 % — AB (ref 39.0–52.0)
HEMOGLOBIN: 11.4 g/dL — AB (ref 13.0–17.0)
LYMPHS ABS: 1.7 10*3/uL (ref 0.7–4.0)
LYMPHS PCT: 19 %
MCH: 31.8 pg (ref 26.0–34.0)
MCHC: 32.8 g/dL (ref 30.0–36.0)
MCV: 96.9 fL (ref 78.0–100.0)
MONOS PCT: 13 %
Monocytes Absolute: 1.2 10*3/uL — ABNORMAL HIGH (ref 0.1–1.0)
NEUTROS ABS: 6 10*3/uL (ref 1.7–7.7)
Neutrophils Relative %: 67 %
Platelets: 332 10*3/uL (ref 150–400)
RBC: 3.59 MIL/uL — AB (ref 4.22–5.81)
RDW: 13.9 % (ref 11.5–15.5)
WBC: 9 10*3/uL (ref 4.0–10.5)

## 2017-03-05 LAB — URINALYSIS, MICROSCOPIC (REFLEX): BACTERIA UA: NONE SEEN

## 2017-03-05 LAB — BASIC METABOLIC PANEL
Anion gap: 12 (ref 5–15)
BUN: 18 mg/dL (ref 6–20)
CO2: 25 mmol/L (ref 22–32)
Calcium: 9.1 mg/dL (ref 8.9–10.3)
Chloride: 101 mmol/L (ref 101–111)
Creatinine, Ser: 1.01 mg/dL (ref 0.61–1.24)
GFR calc Af Amer: >60 mL/min (ref >60)
GLUCOSE: 218 mg/dL — AB (ref 65–99)
POTASSIUM: 3.8 mmol/L (ref 3.5–5.1)
Sodium: 138 mmol/L (ref 135–145)

## 2017-03-05 MED ORDER — KETOROLAC TROMETHAMINE 15 MG/ML IJ SOLN
INTRAMUSCULAR | Status: AC
  Filled 2017-03-05: qty 1

## 2017-03-05 MED ORDER — FENTANYL CITRATE (PF) 100 MCG/2ML IJ SOLN
50.0000 ug | Freq: Once | INTRAMUSCULAR | Status: AC
  Administered 2017-03-05: 50 ug via INTRAVENOUS
  Filled 2017-03-05: qty 2

## 2017-03-05 MED ORDER — OXYCODONE-ACETAMINOPHEN 5-325 MG PO TABS: 1.0000 | ORAL_TABLET | ORAL | 0 refills | Status: DC | PRN

## 2017-03-05 MED ORDER — KETOROLAC TROMETHAMINE 15 MG/ML IJ SOLN
7.5000 mg | Freq: Once | INTRAMUSCULAR | Status: AC
  Administered 2017-03-05: 7.5 mg via INTRAVENOUS

## 2017-03-05 MED ORDER — FENTANYL CITRATE (PF) 100 MCG/2ML IJ SOLN: 50.0000 ug | Freq: Once | INTRAMUSCULAR | Status: DC

## 2017-03-05 MED ORDER — ONDANSETRON HCL 4 MG/2ML IJ SOLN
4.0000 mg | Freq: Once | INTRAMUSCULAR | Status: AC
  Administered 2017-03-05: 4 mg via INTRAVENOUS
  Filled 2017-03-05: qty 2

## 2017-03-05 MED ORDER — HYDROMORPHONE HCL 1 MG/ML IJ SOLN
0.5000 mg | Freq: Once | INTRAMUSCULAR | Status: AC
  Administered 2017-03-05: 0.5 mg via INTRAVENOUS
  Filled 2017-03-05: qty 1

## 2017-03-05 NOTE — ED Triage Notes (Signed)
Pt reports right flank pain radiating to right groin since yesterday

## 2017-03-05 NOTE — ED Notes (Signed)
ED Provider at bedside. 

## 2017-03-05 NOTE — ED Provider Notes (Signed)
Portal DEPT [facility] Provider Note: Jacob Spurling, MD, FACEP  CSN: [id number] MRN: [id number] ARRIVAL: 03/05/17 at [place]: [place]  Flank Pain  Level V caveat: Limited ability to give history due to previous stroke HISTORY OF PRESENT ILLNESS  [name] is a 71 y.o. male with multiple medical problems. He is here with right flank pain radiating to his right lower quadrant which began yesterday. It worsened this morning. His wife states that he is acting like the pain is fairly severe. He cannot say if it is like his previous kidney stones due to his memory problems. He has not had associated nausea or vomiting. Pain is somewhat worse with movement or palpation.  Consultation with the [facility] state controlled substances database reveals the patient has received 2 prescriptions for hydrocodone in the past year.   Past Medical History:  Diagnosis Date  . AKI (acute kidney injury) ([place])    a. 6/14: resolved after d/c ARB;  b. RA U/S 7/14: no RA stenosis  . Arthritis    of spine  . Atrial flutter ([place])   . Back pain    persistent  . Coronary artery disease    a. Low level exercise Lex MV 3/14: low risk, EF 62%, inf defect 2/2 diaph attenuation vs artifiact, small area of scar possible, no ischemia  . Depression   . Diabetes mellitus    controled by diet  . Diverticulitis   . GERD (gastroesophageal reflux disease)   . H/O alcohol abuse   . Headache(784.0)   . Hearing loss   . History of atrial flutter   . Hx of echocardiogram    Echo 7/14:  Mod LVH, EF 55-60%, Gr 1 DD, mild MR, PASP 36  . Hyperlipidemia   . Hypertension   . Renal stone 04/15/2014  . Seizures ([place])   . Shortness of breath   . Sleep apnea    does not use CPAP  . Stroke [facility])     Past Surgical History:  Procedure Laterality Date  . APPENDECTOMY    . BACK SURGERY    . CARDIAC CATHETERIZATION    . CERVICAL FUSION    . CORONARY ARTERY BYPASS GRAFT  2005   LIMA graft to  LAD,saphenous vein graft to diag.,circumflex, marginal,and to the RCA  . CYSTOSCOPY/RETROGRADE/URETEROSCOPY/STONE EXTRACTION WITH BASKET Left 04/16/2014   Procedure: CYSTOSCOPY/RETROGRADE/LEFT URETEROSCOPY/STONE EXTRACTION WITH BASKET AND LASER LITHOTRIPSY;  Surgeon: Malka So, MD;  Location: [facility] ORS;  Service: Urology;  Laterality: Left;  With STENT  . ESOPHAGOGASTRODUODENOSCOPY  02/10/2012   Procedure: ESOPHAGOGASTRODUODENOSCOPY (EGD);  Surgeon: Cleotis Nipper, MD;  Location: [facility] ENDOSCOPY;  Service: Endoscopy;  Laterality: N/A;  . ESOPHAGOGASTRODUODENOSCOPY  06/29/2012   Procedure: ESOPHAGOGASTRODUODENOSCOPY (EGD);  Surgeon: Winfield Cunas., MD;  Location: [facility] ENDOSCOPY;  Service: Endoscopy;  Laterality: N/A;  . FOREIGN BODY REMOVAL  02/10/2012   Procedure: FOREIGN BODY REMOVAL;  Surgeon: Cleotis Nipper, MD;  Location: [place];  Service: Endoscopy;  Laterality: N/A;  . FOREIGN BODY REMOVAL  06/29/2012   Procedure: FOREIGN BODY REMOVAL;  Surgeon: Winfield Cunas., MD;  Location: [facility] ENDOSCOPY;  Service: Endoscopy;  Laterality: N/A;  . LEFT HEART CATHETERIZATION WITH CORONARY ANGIOGRAM N/A 04/02/2012   Procedure: LEFT HEART CATHETERIZATION WITH CORONARY ANGIOGRAM;  Surgeon: Sinclair Grooms, MD;  Location[facility] LAB;  Service: Cardiovascular;  Laterality: N/A;  . LOOP RECORDER IMPLANT N/A 11/17/2013   Procedure: LOOP RECORDER IMPLANT;  Surgeon: Deboraha Sprang, MD;  Location: [facility] CATH LAB;  Service: Cardiovascular;  Laterality: N/A;  . ORCHIECTOMY    . PARTIAL COLECTOMY     for diverticuli  . SAVORY DILATION  06/29/2012   Procedure: SAVORY DILATION;  Surgeon: Winfield Cunas., MD;  Location: [facility] ENDOSCOPY;  Service: Endoscopy;  Laterality: N/A;  . TEE WITHOUT CARDIOVERSION N/A 11/17/2013   Procedure: TRANSESOPHAGEAL ECHOCARDIOGRAM (TEE);  Surgeon: Larey Dresser, MD;  Location: [facility] ENDOSCOPY;  Service: Cardiovascular;  Laterality: N/A;  . UMBILICAL HERNIA REPAIR      Family History    Problem Relation Age of Onset  . Emphysema Mother        was a smoker  . Asthma Mother   . Hypertension Mother   . Heart disease Father   . Prostate cancer Paternal Grandfather   . Pancreatic cancer Paternal Uncle   . Stroke Paternal Grandmother   . Heart attack Neg Hx     Social History  Substance Use Topics  . Smoking status: Never Smoker  . Smokeless tobacco: Never Used  . Alcohol use No     Comment: rare beer    Prior to Admission medications   Medication Sig Start Date End Date Taking? Authorizing Provider  apixaban (ELIQUIS) 5 MG TABS tablet TAKE 1 TABLET(5 MG) BY MOUTH TWICE DAILY 03/13/15   [provider]  ARIPiprazole (ABILIFY) 5 MG tablet Take by mouth.    [provider]  aspirin EC 81 MG tablet Take 1 tablet (81 mg total) by mouth daily. 12/24/16   Rosalin Hawking, MD  atorvastatin (LIPITOR) 80 MG tablet Take 1 tablet (80 mg total) by mouth daily. Patient taking differently: Take 80 mg by mouth at bedtime.  10/29/13   Copland, Gay Filler, MD  donepezil (ARICEPT) 5 MG tablet Take 5 mg by mouth at bedtime.    [provider]  FLUoxetine (PROZAC) 10 MG capsule Take 30 mg by mouth every morning.    [provider]  fluticasone (FLONASE) 50 MCG/ACT nasal spray Place 2 sprays into both nostrils daily.    [provider]  furosemide (LASIX) 20 MG tablet TAKE 1-2 TABLETS BY MOUTH EVERY DAY AS NEEDED Patient taking differently: 40 mg DAILY in the morning (4am) 10/24/14   Martinique, Peter M, MD  gemfibrozil (LOPID) 600 MG tablet Take 600 mg by mouth.     [provider]  glipiZIDE (GLUCOTROL) 5 MG tablet Take 2.5 mg by mouth 2 (two) times daily before a meal.     [provider]  lamoTRIgine (LAMICTAL) 100 MG tablet Take 1 tablet (100 mg total) by mouth 2 (two) times daily. 10/03/15   Rosalin Hawking, MD  meclizine (ANTIVERT) 12.5 MG tablet Take 1 tablet (12.5 mg total) by mouth 2 (two) times daily as needed for dizziness. 03/31/15    Jaynee Eagles, PA-C  mirtazapine (REMERON) 7.5 MG tablet Take 7.5 mg by mouth.    [provider]  nitroGLYCERIN (NITROSTAT) 0.4 MG SL tablet Place 1 tablet (0.4 mg total) under the tongue every 5 (five) minutes as needed. For chest pain 03/10/15   Martinique, Peter M, MD  OMEPRAZOLE PO Take 20 mg by mouth.    [provider]  ONE TOUCH ULTRA TEST test strip USE TO TEST BLOOD SUGAR DAILY 07/11/15   Darlyne Russian, MD  potassium citrate (UROCIT-K) 10 MEQ (1080 MG) SR tablet Take 10 mEq by mouth 3 (three) times daily with meals.  07/08/14  [provider]  potassium citrate (UROCIT-K) 10 MEQ (1080 MG) SR tablet Take by mouth. 07/08/14   [provider]  sitaGLIPtin (JANUVIA) 100 MG tablet Take 100 mg by mouth every morning. Reported on 09/19/2015    [provider]  traZODone (DESYREL) 150 MG tablet Take 100 mg by mouth at bedtime.     [provider]    Allergies Cephalexin; Doxycycline; Keflex [cephalexin]; Lac bovis; Pentazocine lactate; Talwin [pentazocine]; Tape; and Zanaflex [tizanidine hcl]   REVIEW OF SYSTEMS     PHYSICAL EXAMINATION  Initial Vital Signs Blood pressure (!) 161/79, pulse 85, temperature 97.7 F (36.5 C), temperature source Oral, resp. rate 18, height 5' 6"  (1.676 m), weight 94.3 kg (208 lb), SpO2 93 %.  Examination General: Well-developed, well-nourished male in no acute distress; appears older than age of record HENT: normocephalic; atraumatic Eyes: pupils pinpoint; extraocular muscles intact Neck: supple Heart: regular rate and rhythm Lungs: clear to auscultation bilaterally Abdomen: soft; nondistended; right-sided tenderness; bowel sounds present GU: Right CVA tenderness Extremities: No deformity; full range of motion; +1 edema of lower legs Neurologic: Awake, alert; motor function intact in all extremities with mild left hemiparesis; no facial droop Skin: Warm and dry Psychiatric: Flat affect   RESULTS    Summary of this visit's results, reviewed by myself:   EKG Interpretation  Date/Time:    Ventricular Rate:    PR Interval:    QRS Duration:   QT Interval:    QTC Calculation:   R Axis:     Text Interpretation:        Laboratory Studies: Results for orders placed or performed during the hospital encounter of 03/05/17 (from the past 24 hour(s))  Urinalysis, Routine w reflex microscopic     Status: Abnormal   Collection Time: 03/05/17  4:16 AM  Result Value Ref Range   Color, Urine YELLOW YELLOW   APPearance CLOUDY (A) CLEAR   Specific Gravity, Urine 1.027 1.005 - 1.030   pH 5.0 5.0 - 8.0   Glucose, UA NEGATIVE NEGATIVE mg/dL   Hgb urine dipstick LARGE (A) NEGATIVE   Bilirubin Urine NEGATIVE NEGATIVE   Ketones, ur NEGATIVE NEGATIVE mg/dL   Protein, ur 100 (A) NEGATIVE mg/dL   Nitrite NEGATIVE NEGATIVE   Leukocytes, UA NEGATIVE NEGATIVE  Urinalysis, Microscopic (reflex)     Status: Abnormal   Collection Time: 03/05/17  4:16 AM  Result Value Ref Range   RBC / HPF TOO NUMEROUS TO COUNT 0 - 5 RBC/hpf   WBC, UA 0-5 0 - 5 WBC/hpf   Bacteria, UA NONE SEEN NONE SEEN   Squamous Epithelial / LPF 0-5 (A) NONE SEEN   Mucous PRESENT   Basic metabolic panel     Status: Abnormal   Collection Time: 03/05/17  4:27 AM  Result Value Ref Range   Sodium 138 135 - 145 mmol/L   Potassium 3.8 3.5 - 5.1 mmol/L   Chloride 101 101 - 111 mmol/L   CO2 25 22 - 32 mmol/L   Glucose, Bld 218 (H) 65 - 99 mg/dL   BUN 18 6 - 20 mg/dL   Creatinine, Ser 1.01 0.61 - 1.24 mg/dL   Calcium 9.1 8.9 - 10.3 mg/dL   GFR calc non Af Amer >60 >60 mL/min   GFR calc Af Amer >60 >60 mL/min   Anion gap 12 5 - 15  CBC with Differential/Platelet     Status: Abnormal   Collection Time: 03/05/17  4:27 AM  Result Value Ref Range  WBC 9.0 4.0 - 10.5 K/uL   RBC 3.59 (L) 4.22 - 5.81 MIL/uL   Hemoglobin 11.4 (L) 13.0 - 17.0 g/dL   HCT 34.8 (L) 39.0 - 52.0 %   MCV 96.9 78.0 - 100.0 fL   MCH 31.8 26.0 - 34.0 pg    MCHC 32.8 30.0 - 36.0 g/dL   RDW 13.9 11.5 - 15.5 %   Platelets 332 150 - 400 K/uL   Neutrophils Relative % 67 %   Lymphocytes Relative 19 %   Monocytes Relative 13 %   Eosinophils Relative 1 %   Basophils Relative 0 %   Neutro Abs 6.0 1.7 - 7.7 K/uL   Lymphs Abs 1.7 0.7 - 4.0 K/uL   Monocytes Absolute 1.2 (H) 0.1 - 1.0 K/uL   Eosinophils Absolute 0.1 0.0 - 0.7 K/uL   Basophils Absolute 0.0 0.0 - 0.1 K/uL   WBC Morphology MILD LEFT SHIFT (1-5% METAS, OCC MYELO, OCC BANDS)    Imaging Studies: Ct Renal Stone Study  Result Date: 03/05/2017 CLINICAL DATA:  71 year old male with right flank and right groin pain. EXAM: CT ABDOMEN AND PELVIS WITHOUT CONTRAST TECHNIQUE: Multidetector CT imaging of the abdomen and pelvis was performed following the standard protocol without IV contrast. COMPARISON:  Abdominal radiograph dated 09/14/2014 and CT dated 04/15/2014 FINDINGS: Evaluation of this exam is limited in the absence of intravenous contrast. Lower chest: There bibasilar linear atelectasis/ scarring. The lung bases are otherwise clear. There is calcified pleural plaques likely sequela of asbestos exposure. There is advanced coronary vascular calcification and postsurgical changes of CABG. No intra-abdominal free air or free fluid. Hepatobiliary: Diffuse fatty liver. No intrahepatic biliary ductal dilatation. The gallbladder is unremarkable. Pancreas: Unremarkable. No pancreatic ductal dilatation or surrounding inflammatory changes. Spleen: Normal in size without focal abnormality. Adrenals/Urinary Tract: There is a 2 mm stone in the distal right ureter close to the ureterovesical junction. There is mild right hydronephrosis. Right perinephric stranding noted. Correlation with urinalysis recommended to exclude UTI. Punctate nonobstructing left renal inferior pole stone may be present. There is no hydronephrosis on the left. The left ureter appears unremarkable. The urinary bladder is collapsed.  Stomach/Bowel: There is postsurgical changes of partial colectomy with anastomotic suture in the sigmoid colon. There is no evidence of bowel obstruction or active inflammation. Vascular/Lymphatic: There is advanced aortoiliac atherosclerotic disease. The abdominal aorta and IVC are grossly unremarkable on this noncontrast study. No portal venous gas. There is no adenopathy. Reproductive: The prostate and seminal vesicles are grossly unremarkable. Penile implant with reservoir in the right hemipelvis. Other: Midline vertical anterior abdominal wall incisional scar. Musculoskeletal: Degenerative changes of the spine. L5-S1 disc spacer and posterior fusion hardware. No acute fracture. IMPRESSION: 1. A 2 mm distal right ureteral stone with mild right hydronephrosis. 2. Fatty liver. 3. No bowel obstruction or active inflammation. 4.  Aortic Atherosclerosis (ICD10-I70.0). Electronically Signed   By: Anner Crete M.D.   On: 03/05/2017 05:09    ED COURSE  Nursing notes and initial vitals signs, including pulse oximetry, reviewed.  Vitals:   03/05/17 0413 03/05/17 0513  BP: (!) 161/79   Pulse: 85 (!) 106  Resp: 18   Temp: 97.7 F (36.5 C)   TempSrc: Oral   SpO2: 93% 94%  Weight: 94.3 kg (208 lb)   Height: 5' 6"  (1.676 m)    6:20 AM Pain well controlled at this time. Patient states he is ready to go home. Dr. Jeffie Pollock is his urologist with whom he can follow-up.  PROCEDURES    ED DIAGNOSES     ICD-10-CM   1. Ureterolithiasis N20.1        Chera Slivka, MD 03/05/17 [phone]

## 2017-03-06 DIAGNOSIS — N201 Calculus of ureter: Secondary | ICD-10-CM | POA: Diagnosis not present

## 2017-04-10 DIAGNOSIS — N201 Calculus of ureter: Secondary | ICD-10-CM | POA: Diagnosis not present

## 2017-04-15 ENCOUNTER — Encounter (HOSPITAL_COMMUNITY): Payer: Self-pay | Admitting: Emergency Medicine

## 2017-04-15 ENCOUNTER — Emergency Department (HOSPITAL_COMMUNITY): Payer: Medicare Other

## 2017-04-15 ENCOUNTER — Emergency Department (HOSPITAL_COMMUNITY)
Admission: EM | Admit: 2017-04-15 | Discharge: 2017-04-15 | Disposition: A | Discharge status: Home / Self Care | Payer: Medicare Other | Attending: Emergency Medicine | Admitting: Emergency Medicine

## 2017-04-15 DIAGNOSIS — R51 Headache: Secondary | ICD-10-CM | POA: Diagnosis not present

## 2017-04-15 DIAGNOSIS — Z86718 Personal history of other venous thrombosis and embolism: Secondary | ICD-10-CM | POA: Diagnosis not present

## 2017-04-15 DIAGNOSIS — Z79899 Other long term (current) drug therapy: Secondary | ICD-10-CM | POA: Insufficient documentation

## 2017-04-15 DIAGNOSIS — Y92003 Bedroom of unspecified non-institutional (private) residence as the place of occurrence of the external cause: Secondary | ICD-10-CM | POA: Insufficient documentation

## 2017-04-15 DIAGNOSIS — I10 Essential (primary) hypertension: Secondary | ICD-10-CM | POA: Insufficient documentation

## 2017-04-15 DIAGNOSIS — I2510 Atherosclerotic heart disease of native coronary artery without angina pectoris: Secondary | ICD-10-CM | POA: Diagnosis not present

## 2017-04-15 DIAGNOSIS — E785 Hyperlipidemia, unspecified: Secondary | ICD-10-CM | POA: Diagnosis not present

## 2017-04-15 DIAGNOSIS — Y998 Other external cause status: Secondary | ICD-10-CM | POA: Diagnosis not present

## 2017-04-15 DIAGNOSIS — Z8673 Personal history of transient ischemic attack (TIA), and cerebral infarction without residual deficits: Secondary | ICD-10-CM | POA: Diagnosis not present

## 2017-04-15 DIAGNOSIS — Y939 Activity, unspecified: Secondary | ICD-10-CM | POA: Insufficient documentation

## 2017-04-15 DIAGNOSIS — S0990XA Unspecified injury of head, initial encounter: Secondary | ICD-10-CM | POA: Insufficient documentation

## 2017-04-15 DIAGNOSIS — W06XXXA Fall from bed, initial encounter: Secondary | ICD-10-CM | POA: Diagnosis not present

## 2017-04-15 DIAGNOSIS — W19XXXA Unspecified fall, initial encounter: Secondary | ICD-10-CM

## 2017-04-15 DIAGNOSIS — E119 Type 2 diabetes mellitus without complications: Secondary | ICD-10-CM | POA: Diagnosis not present

## 2017-04-15 DIAGNOSIS — S2232XA Fracture of one rib, left side, initial encounter for closed fracture: Secondary | ICD-10-CM | POA: Diagnosis not present

## 2017-04-15 DIAGNOSIS — R0781 Pleurodynia: Secondary | ICD-10-CM | POA: Diagnosis not present

## 2017-04-15 DIAGNOSIS — S299XXA Unspecified injury of thorax, initial encounter: Secondary | ICD-10-CM | POA: Diagnosis present

## 2017-04-15 MED ORDER — HYDROCODONE-ACETAMINOPHEN 5-325 MG PO TABS: 1.0000 | ORAL_TABLET | Freq: Four times a day (QID) | ORAL | 0 refills | Status: DC | PRN

## 2017-04-15 NOTE — ED Triage Notes (Signed)
Pt sts fall from bed this am; pt sts right upper back pain after hitting on stool

## 2017-04-15 NOTE — ED Provider Notes (Signed)
[facility] DEPT Provider Note   CSN: [id number] Arrival date & time: 04/15/17  1021     History   Chief Complaint Chief Complaint  Patient presents with  . Fall    HPI [name] is a 71 y.o. male.  Patient is a 71 year old male with past medical history of coronary artery disease, atrial fibrillation, diabetes, and prior stroke with short-term memory deficit. He presents today for evaluation of fall. Apparently he attempted to get up last night and slid out of bed striking his back on the bed frame. He is complaining of pain in the right ribs. He also had a fall one week ago in the bathroom and injured his left ribs. He reports pain with movement and inspiration, but denies any difficulty breathing, fevers, productive cough.   The history is provided by the patient.  Fall  This is a new problem. Episode onset: Last night  The problem occurs constantly. The problem has not changed since onset.Associated symptoms include chest pain and headaches. Pertinent negatives include no shortness of breath. Nothing aggravates the symptoms. Nothing relieves the symptoms.    Past Medical History:  Diagnosis Date  . AKI (acute kidney injury) ([place])    a. 6/14: resolved after d/c ARB;  b. RA U/S 7/14: no RA stenosis  . Arthritis    of spine  . Atrial flutter ([place])   . Back pain    persistent  . Coronary artery disease    a. Low level exercise Lex MV 3/14: low risk, EF 62%, inf defect 2/2 diaph attenuation vs artifiact, small area of scar possible, no ischemia  . Depression   . Diabetes mellitus    controled by diet  . Diverticulitis   . GERD (gastroesophageal reflux disease)   . H/O alcohol abuse   . Headache(784.0)   . Hearing loss   . History of atrial flutter   . Hx of echocardiogram    Echo 7/14:  Mod LVH, EF 55-60%, Gr 1 DD, mild MR, PASP 36  . Hyperlipidemia   . Hypertension   . Renal stone 04/15/2014  . Seizures ([place])   . Shortness of breath   . Sleep apnea    does  not use CPAP  . Stroke [facility])     Patient Active Problem List   Diagnosis Date Noted  . Cerebrovascular accident (CVA) due to embolism of right anterior cerebral artery ([place]) 09/19/2015  . History of CVA with residual deficit 09/19/2015  . Type 2 diabetes mellitus with circulatory disorder ([place]) 07/12/2015  . HLD (hyperlipidemia) 07/12/2015  . Essential hypertension 07/12/2015  . Coronary artery disease involving coronary bypass graft of native heart with unspecified angina pectoris 07/12/2015  . Intracranial carotid stenosis 07/12/2015  . Morbid obesity ([place]) 02/21/2015  . Acute ischemic stroke ([place])   . Dysarthria   . History of pulmonary embolism   . Stroke ([place]) 02/11/2015  . CVA (cerebral infarction) 02/11/2015  . Acute right ACA stroke ([place]) 02/11/2015  . Lower extremity weakness 01/08/2015  . Status post placement of implantable loop recorder 08/01/2014  . Chronic respiratory failure, unspecified whether with hypoxia or hypercapnia ([place]) 05/19/2014  . Pulmonary embolism ([place]) 05/19/2014  . Hemiparesis and speech and language deficit as late effects of stroke ([place]) 04/19/2014  . Ureteral colic 03/54/6568  . Left ureteral stone 04/15/2014  . Obstructive sleep apnea 12/07/2013  . Obesity, unspecified 12/07/2013  . Embolic stroke ([place]) 12/75/1700  . Cough 06/09/2013  . S/P lumbar spine operation 06/02/2013  .  Nerve root pain 06/02/2013  . Pleural plaque consistent with asbestos exposure by CT 10/2012 02/17/2013  . Renal failure 02/13/2013  . Dyspnea 02/11/2013  . Dysphagia, pharyngoesophageal phase 05/12/2012  . Seizure disorder ([place]) 04/04/2012  . CAD (coronary artery disease) 06/13/2011  . HTN (hypertension) 06/13/2011  . Diabetes mellitus ([place]) 06/13/2011  . History of seizure disorder   . Depression   . Back pain   . Hearing loss   . Arrhythmia   . Diverticulitis   . H/O alcohol abuse   . Personal history of other diseases of circulatory system 05/29/2011     Past Surgical History:  Procedure Laterality Date  . APPENDECTOMY    . BACK SURGERY    . CARDIAC CATHETERIZATION    . CERVICAL FUSION    . CORONARY ARTERY BYPASS GRAFT  2005   LIMA graft to LAD,saphenous vein graft to diag.,circumflex, marginal,and to the RCA  . CYSTOSCOPY/RETROGRADE/URETEROSCOPY/STONE EXTRACTION WITH BASKET Left 04/16/2014   Procedure: CYSTOSCOPY/RETROGRADE/LEFT URETEROSCOPY/STONE EXTRACTION WITH BASKET AND LASER LITHOTRIPSY;  Surgeon: Malka So, MD;  Location: [facility];  Service: Urology;  Laterality: Left;  With STENT  . ESOPHAGOGASTRODUODENOSCOPY  02/10/2012   Procedure: ESOPHAGOGASTRODUODENOSCOPY (EGD);  Surgeon: Cleotis Nipper, MD;  Location: [facility] ENDOSCOPY;  Service: Endoscopy;  Laterality: N/A;  . ESOPHAGOGASTRODUODENOSCOPY  06/29/2012   Procedure: ESOPHAGOGASTRODUODENOSCOPY (EGD);  Surgeon: Winfield Cunas., MD;  Location: [facility];  Service: Endoscopy;  Laterality: N/A;  . FOREIGN BODY REMOVAL  02/10/2012   Procedure: FOREIGN BODY REMOVAL;  Surgeon: Cleotis Nipper, MD;  Location: [facility];  Service: Endoscopy;  Laterality: N/A;  . FOREIGN BODY REMOVAL  06/29/2012   Procedure: FOREIGN BODY REMOVAL;  Surgeon: Winfield Cunas., MD;  Location: [facility] ENDOSCOPY;  Service: Endoscopy;  Laterality: N/A;  . LEFT HEART CATHETERIZATION WITH CORONARY ANGIOGRAM N/A 04/02/2012   Procedure: LEFT HEART CATHETERIZATION WITH CORONARY ANGIOGRAM;  Surgeon: Sinclair Grooms, MD;  Location: [facility] CATH LAB;  Service: Cardiovascular;  Laterality: N/A;  . LOOP RECORDER IMPLANT N/A 11/17/2013   Procedure: LOOP RECORDER IMPLANT;  Surgeon: Deboraha Sprang, MD;  Location: [facility] CATH LAB;  Service: Cardiovascular;  Laterality: N/A;  . ORCHIECTOMY    . PARTIAL COLECTOMY     for diverticuli  . SAVORY DILATION  06/29/2012   Procedure: SAVORY DILATION;  Surgeon: Winfield Cunas., MD;  Location: [facility] ENDOSCOPY;  Service: Endoscopy;  Laterality: N/A;  . TEE WITHOUT CARDIOVERSION N/A 11/17/2013    Procedure: TRANSESOPHAGEAL ECHOCARDIOGRAM (TEE);  Surgeon: Larey Dresser, MD;  Location: [facility];  Service: Cardiovascular;  Laterality: N/A;  . UMBILICAL HERNIA REPAIR         Home Medications    Prior to Admission medications   Medication Sig Start Date End Date Taking? Authorizing Provider  apixaban (ELIQUIS) 5 MG TABS tablet TAKE 1 TABLET(5 MG) BY MOUTH TWICE DAILY 03/13/15   [provider]  ARIPiprazole (ABILIFY) 5 MG tablet Take by mouth.    [provider]  aspirin EC 81 MG tablet Take 1 tablet (81 mg total) by mouth daily. 12/24/16   Rosalin Hawking, MD  atorvastatin (LIPITOR) 80 MG tablet Take 1 tablet (80 mg total) by mouth daily. Patient taking differently: Take 80 mg by mouth at bedtime.  10/29/13   Copland, Gay Filler, MD  donepezil (ARICEPT) 5 MG tablet Take 5 mg by mouth at bedtime.    [provider]  FLUoxetine (PROZAC) 10 MG capsule Take 30 mg by mouth  every morning.    [provider]  fluticasone (FLONASE) 50 MCG/ACT nasal spray Place 2 sprays into both nostrils daily.    [provider]  furosemide (LASIX) 20 MG tablet TAKE 1-2 TABLETS BY MOUTH EVERY DAY AS NEEDED Patient taking differently: 40 mg DAILY in the morning (4am) 10/24/14   Martinique, Peter M, MD  gemfibrozil (LOPID) 600 MG tablet Take 600 mg by mouth.     [provider]  glipiZIDE (GLUCOTROL) 5 MG tablet Take 2.5 mg by mouth 2 (two) times daily before a meal.     [provider]  lamoTRIgine (LAMICTAL) 100 MG tablet Take 1 tablet (100 mg total) by mouth 2 (two) times daily. 10/03/15   Rosalin Hawking, MD  meclizine (ANTIVERT) 12.5 MG tablet Take 1 tablet (12.5 mg total) by mouth 2 (two) times daily as needed for dizziness. 03/31/15   Jaynee Eagles, PA-C  mirtazapine (REMERON) 7.5 MG tablet Take 7.5 mg by mouth.    [provider]  nitroGLYCERIN (NITROSTAT) 0.4 MG SL tablet Place 1 tablet (0.4 mg total) under the tongue every 5 (five) minutes as  needed. For chest pain 03/10/15   [place]ique, Peter M, MD  OMEPRAZOLE PO Take 20 mg by mouth.    [provider]  ONE TOUCH ULTRA TEST test strip USE TO TEST BLOOD SUGAR DAILY 07/11/15   Darlyne Russian, MD  oxyCODONE-acetaminophen (PERCOCET) 5-325 MG tablet Take 1 tablet by mouth every 4 (four) hours as needed (for pain). 03/05/17   Molpus, John, MD  potassium citrate (UROCIT-K) 10 MEQ (1080 MG) SR tablet Take 10 mEq by mouth 3 (three) times daily with meals.  07/08/14   [provider]  potassium citrate (UROCIT-K) 10 MEQ (1080 MG) SR tablet Take by mouth. 07/08/14   [provider]  sitaGLIPtin (JANUVIA) 100 MG tablet Take 100 mg by mouth every morning. Reported on 09/19/2015    [provider]  traZODone (DESYREL) 150 MG tablet Take 100 mg by mouth at bedtime.     [provider]    Family History Family History  Problem Relation Age of Onset  . Emphysema Mother        was a smoker  . Asthma Mother   . Hypertension Mother   . Heart disease Father   . Prostate cancer Paternal Grandfather   . Pancreatic cancer Paternal Uncle   . Stroke Paternal Grandmother   . Heart attack Neg Hx     Social History Social History  Substance Use Topics  . Smoking status: Never Smoker  . Smokeless tobacco: Never Used  . Alcohol use No     Comment: rare beer     Allergies   Cephalexin; Doxycycline; Keflex [cephalexin]; Lac bovis; Pentazocine lactate; Talwin [pentazocine]; Tape; and Zanaflex [tizanidine hcl]   Review of Systems Review of Systems  Respiratory: Negative for shortness of breath.   Cardiovascular: Positive for chest pain.  Neurological: Positive for headaches.  All other systems reviewed and are negative.    Physical Exam Updated Vital Signs BP (!) 141/82 (BP Location: Right Arm)   Pulse 85   Temp 98.2 F (36.8 C) (Oral)   Resp 17   Ht 5' 6"  (1.676 m)   Wt 93 kg (205 lb)   SpO2 93%   BMI 33.09 kg/m   Physical Exam   Constitutional: He is oriented to person, place, and time. He appears well-developed and well-nourished. No distress.  HENT:  Head: Normocephalic and atraumatic.  Mouth/Throat:  Oropharynx is clear and moist.  Neck: Normal range of motion. Neck supple.  Cardiovascular: Normal rate and regular rhythm.  Exam reveals no friction rub.   No murmur heard. Pulmonary/Chest: Effort normal and breath sounds normal. No respiratory distress. He has no wheezes. He has no rales.  Abdominal: Soft. Bowel sounds are normal. He exhibits no distension. There is no tenderness.  Musculoskeletal: Normal range of motion. He exhibits no edema.  Neurological: He is alert and oriented to person, place, and time. Coordination normal.  Skin: Skin is warm and dry. He is not diaphoretic.  Nursing note and vitals reviewed.    ED Treatments / Results  Labs (all labs ordered are listed, but only abnormal results are displayed) Labs Reviewed - No data to display  EKG  EKG Interpretation None       Radiology No results found.  Procedures Procedures (including critical care time)  Medications Ordered in ED Medications - No data to display   Initial Impression / Assessment and Plan / ED Course  I have reviewed the triage vital signs and the nursing notes.  Pertinent labs & imaging results that were available during my care of the patient were reviewed by me and considered in my medical decision making (see chart for details).  X-ray show no evidence for right-sided rib fracture and head CT is negative. The patient's wife did tell me he fell about one week ago injuring his left chest. He does have what appears to be a second rib fracture with no pneumothorax. He will be given pain medicine and advised to follow-up as needed.  Final Clinical Impressions(s) / ED Diagnoses   Final diagnoses:  None    New Prescriptions New Prescriptions   No medications on file     Veryl Speak, MD 04/15/17 [phone]

## 2017-04-15 NOTE — Discharge Instructions (Signed)
Hydrocodone as prescribed as needed for pain.  Return to the emergency department if you develop worsening pain, difficulty breathing, high fevers, or other new and concerning symptoms.

## 2017-04-22 ENCOUNTER — Telehealth: Payer: Self-pay

## 2017-04-22 NOTE — Telephone Encounter (Signed)
RN call  [name] at [phone]. Her name was on the [facility] representative who sign the form. Rn stated the pt was last seen 11/2016 for a stroke follow up. Rn stated the form is post dated 04/29/2017 to be signed by Dr. Erlinda Sharp. Rn stated the Ed visit are not related to any neurological issue. The visit from 02/2017 is for flank pain possible kidney stones. Rn stated the form should be sent to patients PCP. RN stated Dr. Everlene Sharp name is listed as his PCP. [name] stated he does not have a PCP. [name] stated the pt goes to the [place]. Rn ask who prescribes the patients pain medicine, diabetes, and cholesterol medications. [name] stated she was unsure. She stated the ED social worker contacted her, and told her to send the referral over. Rn stated Dr. Erlinda Sharp will make the decision if he will sign the form that is postdated. Rn receive the form on 04/22/2017.

## 2017-04-23 ENCOUNTER — Telehealth: Payer: Self-pay | Admitting: Emergency Medicine

## 2017-04-23 NOTE — Telephone Encounter (Signed)
Rn call DR. Xu about [facility] posted dated a well care referral form to be sign on 04/29/2017. Rn stated the pt was in the ED in July 2018 for abdominal pain,and August 2018 for a fall. Rn stated there is no notes pt had stroke at any hospital in the area or at the neighboring hospital. Rn stated they want him to sign the form that states to treat and evaluate fall from 02/2017 ,and 03/2017. PT has an appt on 04/29/2017 for stroke and seizure follow up. Dr Erlinda Hong he cannot sign a form for pts ED visits for abdominal pain, and fall for home rehab. Per Dr Erlinda Hong if the patient had a stroke and was admitted in the hospital he can sign the form. The notes from the ED of other aliments. Pt would have to seek his [place] doctor or PCP. Dr. Erlinda Hong stated he will see the pt on 04/29/2017 for his stroke and seizures follow up.The form cannot be sign.

## 2017-04-23 NOTE — Telephone Encounter (Signed)
JILL - I AM SENDING THIS MESSAGE TO YOU BECAUSE DR. DAUB SAW HIM LAST IN MAY OF 2017. THIS CALL IS FROM KATRINA FROM [facility] WHO IS DR. Mariella Saa NURSE. THIS PATIENT WAS IN THE HOSPITAL FROM A FALL THIS MONTH. A WE[facility] [employer] REPRESENTATIVE BROUGHT DR. Erlinda Hong A FORM TO FILL OUT TO TREAT AND EVALUATE HIM FOR HIS FALL AND KIDNEY STONES. DR. Erlinda Hong SAID THIS NEEDS TO BE DONE BY THE PATIENT'S PCP WHICH WAS DR. DAUB. THEY ARE TRYING TO FIND OUT WHO HAS BEEN REFILLING HIS DIABETIC MEDICATIONS. PLEASE RETURN HER CALL BECAUSE DR. Erlinda Hong IS ONLY TREATING HIM FOR HIS STROKE. BEST PHONE [phone] ([place] NURSE WHO IS KATRINA) [place]

## 2017-04-23 NOTE — Telephone Encounter (Signed)
Rn call [facility] at [phone] and spoke with Loma Sousa. Rn explain that Dr. Erlinda Hong cannot sign a [facility] referral form for recent fall in 8/09/26/2016 and Ed visit for 02/2017 for abdominal pain. Loma Sousa stated she will send message to [place]ignon [place] who sent the referral form that per Dr. Erlinda Hong the home health form cannot be sign. Rn stated pt was not admitted for a stroke during these visits.

## 2017-04-23 NOTE — Telephone Encounter (Signed)
Rn call Dr. [name] [facility] office to find out if patient still goes there for his primary care. Stanton Kidney stated that pt was last seen 01/04/2016 at the office. The pt does not have any further appts at the PCP office. Pt was going to the [place]. PTs was seen last 12/2015 for his diabetes. Rn will let Dr. Erlinda Hong know.

## 2017-04-24 ENCOUNTER — Emergency Department (HOSPITAL_COMMUNITY): Payer: Non-veteran care

## 2017-04-24 ENCOUNTER — Inpatient Hospital Stay (HOSPITAL_COMMUNITY)
Admission: EM | Admit: 2017-04-24 | Discharge: 2017-04-26 | DRG: 948 | Disposition: A | Discharge status: Home Health | Payer: Non-veteran care | Attending: Internal Medicine | Admitting: Internal Medicine

## 2017-04-24 ENCOUNTER — Other Ambulatory Visit: Payer: Self-pay

## 2017-04-24 DIAGNOSIS — Z881 Allergy status to other antibiotic agents status: Secondary | ICD-10-CM

## 2017-04-24 DIAGNOSIS — R079 Chest pain, unspecified: Secondary | ICD-10-CM | POA: Diagnosis not present

## 2017-04-24 DIAGNOSIS — Z7982 Long term (current) use of aspirin: Secondary | ICD-10-CM

## 2017-04-24 DIAGNOSIS — Z888 Allergy status to other drugs, medicaments and biological substances status: Secondary | ICD-10-CM

## 2017-04-24 DIAGNOSIS — I2510 Atherosclerotic heart disease of native coronary artery without angina pectoris: Secondary | ICD-10-CM | POA: Diagnosis present

## 2017-04-24 DIAGNOSIS — Z77090 Contact with and (suspected) exposure to asbestos: Secondary | ICD-10-CM | POA: Diagnosis present

## 2017-04-24 DIAGNOSIS — E1159 Type 2 diabetes mellitus with other circulatory complications: Secondary | ICD-10-CM | POA: Diagnosis present

## 2017-04-24 DIAGNOSIS — Z951 Presence of aortocoronary bypass graft: Secondary | ICD-10-CM

## 2017-04-24 DIAGNOSIS — I10 Essential (primary) hypertension: Secondary | ICD-10-CM | POA: Diagnosis present

## 2017-04-24 DIAGNOSIS — Z7951 Long term (current) use of inhaled steroids: Secondary | ICD-10-CM

## 2017-04-24 DIAGNOSIS — Z8249 Family history of ischemic heart disease and other diseases of the circulatory system: Secondary | ICD-10-CM

## 2017-04-24 DIAGNOSIS — Z9049 Acquired absence of other specified parts of digestive tract: Secondary | ICD-10-CM

## 2017-04-24 DIAGNOSIS — F329 Major depressive disorder, single episode, unspecified: Secondary | ICD-10-CM | POA: Diagnosis present

## 2017-04-24 DIAGNOSIS — R0902 Hypoxemia: Secondary | ICD-10-CM | POA: Diagnosis not present

## 2017-04-24 DIAGNOSIS — Z86711 Personal history of pulmonary embolism: Secondary | ICD-10-CM

## 2017-04-24 DIAGNOSIS — F32A Depression, unspecified: Secondary | ICD-10-CM | POA: Diagnosis present

## 2017-04-24 DIAGNOSIS — K219 Gastro-esophageal reflux disease without esophagitis: Secondary | ICD-10-CM | POA: Diagnosis present

## 2017-04-24 DIAGNOSIS — R531 Weakness: Secondary | ICD-10-CM | POA: Diagnosis not present

## 2017-04-24 DIAGNOSIS — Z8673 Personal history of transient ischemic attack (TIA), and cerebral infarction without residual deficits: Secondary | ICD-10-CM

## 2017-04-24 DIAGNOSIS — Z9981 Dependence on supplemental oxygen: Secondary | ICD-10-CM

## 2017-04-24 DIAGNOSIS — G4730 Sleep apnea, unspecified: Secondary | ICD-10-CM | POA: Diagnosis present

## 2017-04-24 DIAGNOSIS — D72829 Elevated white blood cell count, unspecified: Secondary | ICD-10-CM | POA: Diagnosis not present

## 2017-04-24 DIAGNOSIS — R100 Acute abdomen: Secondary | ICD-10-CM | POA: Diagnosis not present

## 2017-04-24 DIAGNOSIS — I4891 Unspecified atrial fibrillation: Secondary | ICD-10-CM | POA: Diagnosis present

## 2017-04-24 DIAGNOSIS — Z825 Family history of asthma and other chronic lower respiratory diseases: Secondary | ICD-10-CM

## 2017-04-24 DIAGNOSIS — Z7901 Long term (current) use of anticoagulants: Secondary | ICD-10-CM

## 2017-04-24 DIAGNOSIS — Z823 Family history of stroke: Secondary | ICD-10-CM

## 2017-04-24 DIAGNOSIS — I700 Atherosclerosis of aorta: Secondary | ICD-10-CM | POA: Diagnosis not present

## 2017-04-24 DIAGNOSIS — I63421 Cerebral infarction due to embolism of right anterior cerebral artery: Secondary | ICD-10-CM | POA: Diagnosis not present

## 2017-04-24 DIAGNOSIS — H9190 Unspecified hearing loss, unspecified ear: Secondary | ICD-10-CM | POA: Diagnosis present

## 2017-04-24 DIAGNOSIS — Z7984 Long term (current) use of oral hypoglycemic drugs: Secondary | ICD-10-CM

## 2017-04-24 DIAGNOSIS — Z87442 Personal history of urinary calculi: Secondary | ICD-10-CM

## 2017-04-24 DIAGNOSIS — R509 Fever, unspecified: Secondary | ICD-10-CM | POA: Diagnosis not present

## 2017-04-24 DIAGNOSIS — Z885 Allergy status to narcotic agent status: Secondary | ICD-10-CM

## 2017-04-24 DIAGNOSIS — J9611 Chronic respiratory failure with hypoxia: Secondary | ICD-10-CM | POA: Diagnosis present

## 2017-04-24 DIAGNOSIS — Z79899 Other long term (current) drug therapy: Secondary | ICD-10-CM

## 2017-04-24 DIAGNOSIS — R296 Repeated falls: Secondary | ICD-10-CM | POA: Diagnosis present

## 2017-04-24 DIAGNOSIS — R072 Precordial pain: Secondary | ICD-10-CM

## 2017-04-24 DIAGNOSIS — Z9109 Other allergy status, other than to drugs and biological substances: Secondary | ICD-10-CM

## 2017-04-24 DIAGNOSIS — R61 Generalized hyperhidrosis: Secondary | ICD-10-CM | POA: Diagnosis not present

## 2017-04-24 DIAGNOSIS — E785 Hyperlipidemia, unspecified: Secondary | ICD-10-CM | POA: Diagnosis present

## 2017-04-24 DIAGNOSIS — E1165 Type 2 diabetes mellitus with hyperglycemia: Secondary | ICD-10-CM | POA: Diagnosis present

## 2017-04-24 HISTORY — DX: Personal history of other diseases of the digestive system: Z87.19

## 2017-04-24 LAB — URINALYSIS, ROUTINE W REFLEX MICROSCOPIC
BACTERIA UA: NONE SEEN
BILIRUBIN URINE: NEGATIVE
GLUCOSE, UA: NEGATIVE mg/dL
KETONES UR: NEGATIVE mg/dL
Leukocytes, UA: NEGATIVE
NITRITE: NEGATIVE
PH: 5 (ref 5.0–8.0)
PROTEIN: 100 mg/dL — AB
Specific Gravity, Urine: 1.025 (ref 1.005–1.030)
Squamous Epithelial / LPF: NONE SEEN

## 2017-04-24 LAB — CBC WITH DIFFERENTIAL/PLATELET
BASOS ABS: 0 10*3/uL (ref 0.0–0.1)
BASOS PCT: 0 %
EOS ABS: 0.1 10*3/uL (ref 0.0–0.7)
EOS PCT: 1 %
HEMATOCRIT: 36.8 % — AB (ref 39.0–52.0)
Hemoglobin: 11.6 g/dL — ABNORMAL LOW (ref 13.0–17.0)
LYMPHS PCT: 16 %
Lymphs Abs: 1.4 10*3/uL (ref 0.7–4.0)
MCH: 30.6 pg (ref 26.0–34.0)
MCHC: 31.5 g/dL (ref 30.0–36.0)
MCV: 97.1 fL (ref 78.0–100.0)
MONO ABS: 1 10*3/uL (ref 0.1–1.0)
Monocytes Relative: 11 %
Neutro Abs: 6.5 10*3/uL (ref 1.7–7.7)
Neutrophils Relative %: 72 %
Platelets: 279 10*3/uL (ref 150–400)
RBC: 3.79 MIL/uL — AB (ref 4.22–5.81)
RDW: 13.5 % (ref 11.5–15.5)
WBC: 9 10*3/uL (ref 4.0–10.5)

## 2017-04-24 LAB — COMPREHENSIVE METABOLIC PANEL
ALK PHOS: 115 U/L (ref 38–126)
ALT: 29 U/L (ref 17–63)
ANION GAP: 11 (ref 5–15)
AST: 38 U/L (ref 15–41)
Albumin: 3.5 g/dL (ref 3.5–5.0)
BILIRUBIN TOTAL: 0.5 mg/dL (ref 0.3–1.2)
BUN: 18 mg/dL (ref 6–20)
CALCIUM: 8.9 mg/dL (ref 8.9–10.3)
CO2: 27 mmol/L (ref 22–32)
Chloride: 101 mmol/L (ref 101–111)
Creatinine, Ser: 0.91 mg/dL (ref 0.61–1.24)
GFR calc Af Amer: >60 mL/min (ref >60)
Glucose, Bld: 142 mg/dL — ABNORMAL HIGH (ref 65–99)
POTASSIUM: 3.9 mmol/L (ref 3.5–5.1)
Sodium: 139 mmol/L (ref 135–145)
TOTAL PROTEIN: 7.2 g/dL (ref 6.5–8.1)

## 2017-04-24 LAB — I-STAT TROPONIN, ED
Troponin i, poc: 0.03 ng/mL (ref 0.00–0.08)
Troponin i, poc: 0.03 ng/mL (ref 0.00–0.08)

## 2017-04-24 LAB — HEMOGLOBIN A1C
Hgb A1c MFr Bld: 7.5 % — ABNORMAL HIGH (ref 4.8–5.6)
MEAN PLASMA GLUCOSE: 168.55 mg/dL

## 2017-04-24 LAB — PROTIME-INR
INR: 1.22
Prothrombin Time: 15.3 seconds — ABNORMAL HIGH (ref 11.4–15.2)

## 2017-04-24 LAB — TSH: TSH: 3.678 u[IU]/mL (ref 0.350–4.500)

## 2017-04-24 LAB — GLUCOSE, CAPILLARY: GLUCOSE-CAPILLARY: 141 mg/dL — AB (ref 65–99)

## 2017-04-24 MED ORDER — DONEPEZIL HCL 5 MG PO TABS
10.0000 mg | ORAL_TABLET | Freq: Every day | ORAL | Status: DC
  Administered 2017-04-25 – 2017-04-26 (×2): 10 mg via ORAL
  Filled 2017-04-24 (×2): qty 2

## 2017-04-24 MED ORDER — ARIPIPRAZOLE 5 MG PO TABS
5.0000 mg | ORAL_TABLET | Freq: Every day | ORAL | Status: DC
  Administered 2017-04-25 – 2017-04-26 (×2): 5 mg via ORAL
  Filled 2017-04-24 (×2): qty 1

## 2017-04-24 MED ORDER — MORPHINE SULFATE (PF) 4 MG/ML IV SOLN
4.0000 mg | Freq: Once | INTRAVENOUS | Status: AC
Start: 2017-04-24 — End: 2017-04-24
  Administered 2017-04-24: 4 mg via INTRAVENOUS
  Filled 2017-04-24: qty 1

## 2017-04-24 MED ORDER — FUROSEMIDE 10 MG/ML IJ SOLN: 40.0000 mg | Freq: Once | INTRAMUSCULAR | Status: DC

## 2017-04-24 MED ORDER — TRAZODONE HCL 100 MG PO TABS
100.0000 mg | ORAL_TABLET | Freq: Every day | ORAL | Status: DC
  Administered 2017-04-24 – 2017-04-25 (×2): 100 mg via ORAL
  Filled 2017-04-24 (×2): qty 1

## 2017-04-24 MED ORDER — ONDANSETRON HCL 4 MG/2ML IJ SOLN: 4.0000 mg | Freq: Four times a day (QID) | INTRAMUSCULAR | Status: DC | PRN

## 2017-04-24 MED ORDER — ONDANSETRON HCL 4 MG PO TABS: 4.0000 mg | ORAL_TABLET | Freq: Four times a day (QID) | ORAL | Status: DC | PRN

## 2017-04-24 MED ORDER — IOPAMIDOL (ISOVUE-370) INJECTION 76%
INTRAVENOUS | Status: AC
  Administered 2017-04-24: 100 mL via INTRAVENOUS
  Filled 2017-04-24: qty 100

## 2017-04-24 MED ORDER — ACETAMINOPHEN 325 MG PO TABS: 650.0000 mg | ORAL_TABLET | Freq: Four times a day (QID) | ORAL | Status: DC | PRN

## 2017-04-24 MED ORDER — APIXABAN 5 MG PO TABS
5.0000 mg | ORAL_TABLET | Freq: Two times a day (BID) | ORAL | Status: DC
  Administered 2017-04-24 – 2017-04-26 (×4): 5 mg via ORAL
  Filled 2017-04-24 (×4): qty 1

## 2017-04-24 MED ORDER — LOPERAMIDE HCL 2 MG PO CAPS: 2.0000 mg | ORAL_CAPSULE | ORAL | Status: DC | PRN

## 2017-04-24 MED ORDER — LAMOTRIGINE 100 MG PO TABS
100.0000 mg | ORAL_TABLET | Freq: Two times a day (BID) | ORAL | Status: DC
  Administered 2017-04-24 – 2017-04-26 (×4): 100 mg via ORAL
  Filled 2017-04-24 (×4): qty 1

## 2017-04-24 MED ORDER — ACETAMINOPHEN 650 MG RE SUPP
650.0000 mg | Freq: Four times a day (QID) | RECTAL | Status: DC | PRN
Start: 2017-04-24 — End: 2017-04-26

## 2017-04-24 MED ORDER — INSULIN ASPART 100 UNIT/ML ~~LOC~~ SOLN
0.0000 [IU] | Freq: Three times a day (TID) | SUBCUTANEOUS | Status: DC
  Administered 2017-04-25: 3 [IU] via SUBCUTANEOUS
  Administered 2017-04-25: 2 [IU] via SUBCUTANEOUS
  Administered 2017-04-25: 3 [IU] via SUBCUTANEOUS
  Administered 2017-04-26 (×2): 2 [IU] via SUBCUTANEOUS

## 2017-04-24 MED ORDER — POLYVINYL ALCOHOL 1.4 % OP SOLN
1.0000 [drp] | OPHTHALMIC | Status: DC | PRN
  Filled 2017-04-24: qty 15

## 2017-04-24 MED ORDER — HYDROCODONE-ACETAMINOPHEN 5-325 MG PO TABS: 1.0000 | ORAL_TABLET | Freq: Four times a day (QID) | ORAL | Status: DC | PRN

## 2017-04-24 MED ORDER — HYDROCODONE-ACETAMINOPHEN 5-325 MG PO TABS
1.0000 | ORAL_TABLET | ORAL | Status: DC | PRN
  Administered 2017-04-24 – 2017-04-25 (×2): 1 via ORAL
  Filled 2017-04-24 (×2): qty 1

## 2017-04-24 MED ORDER — ONDANSETRON HCL 4 MG/2ML IJ SOLN
4.0000 mg | Freq: Once | INTRAMUSCULAR | Status: AC
  Administered 2017-04-24: 4 mg via INTRAVENOUS
  Filled 2017-04-24: qty 2

## 2017-04-24 MED ORDER — PANTOPRAZOLE SODIUM 40 MG PO TBEC
40.0000 mg | DELAYED_RELEASE_TABLET | Freq: Every day | ORAL | Status: DC
  Administered 2017-04-25: 40 mg via ORAL
  Filled 2017-04-24: qty 1

## 2017-04-24 MED ORDER — FLUOXETINE HCL 10 MG PO CAPS
30.0000 mg | ORAL_CAPSULE | Freq: Every morning | ORAL | Status: DC
  Administered 2017-04-25 – 2017-04-26 (×2): 30 mg via ORAL
  Filled 2017-04-24 (×2): qty 1

## 2017-04-24 MED ORDER — MIRTAZAPINE 15 MG PO TBDP
15.0000 mg | ORAL_TABLET | Freq: Every day | ORAL | Status: DC
  Administered 2017-04-25: 15 mg via ORAL
  Filled 2017-04-24: qty 1

## 2017-04-24 MED ORDER — IOPAMIDOL (ISOVUE-300) INJECTION 61%
INTRAVENOUS | Status: AC
  Filled 2017-04-24: qty 100

## 2017-04-24 NOTE — H&P (Signed)
History and Physical    [name] ZHY:[id number] DOB: 1946/06/06 DOA: 04/24/2017  PCP: Darlyne Russian, MD  Patient coming from: Home  Chief Complaint: Generalized weakness and fall  HPI: [name] is a 71 y.o. male with medical history significant of stroke with residual deficit, history of PE in 2015, is still chronically anticoagulated with Eliquis. Patient came to the hospital because of generalized weakness. His wife was at bedside providing some of the history as she mentioned she has short-term memory difficulties. He fell 3 times in the past 2 weeks, today he couldn't get up out of the bed and she has to help him to sit up. He has some residual deficit after the stroke, but he was able to walk without help of the walker inside the house. He needs a walker when he gets out. He denies any fever, denies any chills, cough, burning while passing urine, denies any other symptoms or signs suggesting infection. Patient mentioned bilateral chest pain in the lower rib area, upon further questioning that. He fell on twice.  ED Course:  Vitals: WNL Labs: WNL Imaging: CT angiography of the chest and abdomen showed no acute findings other than 85% stenosis of the proximal right renal artery. Interventions: Admitted for observation  Review of Systems:  Unobtainable secondary to short-term memory loss  Past Medical History:  Diagnosis Date  . AKI (acute kidney injury) ([place])    a. 6/14: resolved after d/c ARB;  b. RA U/S 7/14: no RA stenosis  . Arthritis    of spine  . Atrial flutter ([place])   . Back pain    persistent  . Coronary artery disease    a. Low level exercise Lex MV 3/14: low risk, EF 62%, inf defect 2/2 diaph attenuation vs artifiact, small area of scar possible, no ischemia  . Depression   . Diabetes mellitus    controled by diet  . Diverticulitis   . GERD (gastroesophageal reflux disease)   . H/O alcohol abuse   . Headache(784.0)   . Hearing loss   . History of atrial  flutter   . Hx of echocardiogram    Echo 7/14:  Mod LVH, EF 55-60%, Gr 1 DD, mild MR, PASP 36  . Hyperlipidemia   . Hypertension   . Renal stone 04/15/2014  . Seizures ([place])   . Shortness of breath   . Sleep apnea    does not use CPAP  . Stroke [facility])     Past Surgical History:  Procedure Laterality Date  . APPENDECTOMY    . BACK SURGERY    . CARDIAC CATHETERIZATION    . CERVICAL FUSION    . CORONARY ARTERY BYPASS GRAFT  2005   LIMA graft to LAD,saphenous vein graft to diag.,circumflex, marginal,and to the RCA  . CYSTOSCOPY/RETROGRADE/URETEROSCOPY/STONE EXTRACTION WITH BASKET Left 04/16/2014   Procedure: CYSTOSCOPY/RETROGRADE/LEFT URETEROSCOPY/STONE EXTRACTION WITH BASKET AND LASER LITHOTRIPSY;  Surgeon: Malka So, MD;  Location: [facility];  Service: Urology;  Laterality: Left;  With STENT  . ESOPHAGOGASTRODUODENOSCOPY  02/10/2012   Procedure: ESOPHAGOGASTRODUODENOSCOPY (EGD);  Surgeon: Cleotis Nipper, MD;  Location: [facility] ENDOSCOPY;  Service: Endoscopy;  Laterality: N/A;  . ESOPHAGOGASTRODUODENOSCOPY  06/29/2012   Procedure: ESOPHAGOGASTRODUODENOSCOPY (EGD);  Surgeon: Winfield Cunas., MD;  Location: [facility];  Service: Endoscopy;  Laterality: N/A;  . FOREIGN BODY REMOVAL  02/10/2012   Procedure: FOREIGN BODY REMOVAL;  Surgeon: Cleotis Nipper, MD;  Location: [facility];  Service: Endoscopy;  Laterality: N/A;  .  FOREIGN BODY REMOVAL  06/29/2012   Procedure: FOREIGN BODY REMOVAL;  Surgeon: Winfield Cunas., MD;  Location: [facility] ENDOSCOPY;  Service: Endoscopy;  Laterality: N/A;  . LEFT HEART CATHETERIZATION WITH CORONARY ANGIOGRAM N/A 04/02/2012   Procedure: LEFT HEART CATHETERIZATION WITH CORONARY ANGIOGRAM;  Surgeon: Sinclair Grooms, MD;  Location: [facility] CATH LAB;  Service: Cardiovascular;  Laterality: N/A;  . LOOP RECORDER IMPLANT N/A 11/17/2013   Procedure: LOOP RECORDER IMPLANT;  Surgeon: Deboraha Sprang, MD;  Location: [facility] CATH LAB;  Service: Cardiovascular;  Laterality: N/A;  .  ORCHIECTOMY    . PARTIAL COLECTOMY     for diverticuli  . SAVORY DILATION  06/29/2012   Procedure: SAVORY DILATION;  Surgeon: Winfield Cunas., MD;  Location: [facility]OS[facility];  Service: Endoscopy;  Laterality: N/A;  . TEE WITHOUT CARDIOVERSION N/A 11/17/2013   Procedure: TRANSESOPHAGEAL ECHOCARDIOGRAM (TEE);  Surgeon: Larey Dresser, MD;  Location: [place];  Service: Cardiovascular;  Laterality: N/A;  . [place]       reports that he has never smoked. He has never used smokeless tobacco. He reports that he does not drink alcohol or use drugs.  Allergies  Allergen Reactions  . Cephalexin     Unknown  . Doxycycline     Unknown  . Keflex [Cephalexin]   . [place]ov[place] Other (See Comments)    lactose intolerant  . Pentazocine Lactate     He passed out- he had a seizure.  This occurred around 2000  . Talwin [Pentazocine] Other (See Comments)    hallucinations  . Tape Other (See Comments)    Paper tape only please.  . Zanaflex [Tizanidine Hcl] Other (See Comments)    Lightheaded and dizzy    Family History  Problem Relation Age of Onset  . Emphysema Mother        was a smoker  . Asthma Mother   . Hypertension Mother   . Heart disease Father   . Prostate cancer Paternal Grandfather   . Pancreatic cancer Paternal Uncle   . Stroke Paternal Grandmother   . Heart attack Neg Hx     Prior to Admission medications   Medication Sig Start Date End Date Taking? Authorizing Provider  acetaminophen (TYLENOL) 500 MG tablet Take 500 mg by mouth every 6 (six) hours as needed.   Yes [provider]  apixaban (ELIQUIS) 5 MG TABS tablet TAKE 1 TABLET(5 MG) BY MOUTH TWICE DAILY 03/13/15  Yes [provider]  ARIPiprazole (ABILIFY) 5 MG tablet Take 5 mg by mouth daily.    Yes [provider]  aspirin EC 81 MG tablet Take 1 tablet (81 mg total) by mouth daily. 12/24/16  Yes Rosalin Hawking, MD  atorvastatin (LIPITOR) 80 MG tablet Take 1 tablet (80 mg total) by  mouth daily. Patient taking differently: Take 80 mg by mouth at bedtime.  10/29/13  Yes Copland, Gay Filler, MD  carbamide peroxide (DEBROX) 6.5 % OTIC solution Place 5 drops into both ears 2 (two) times daily.   Yes [provider]  donepezil (ARICEPT ODT) 10 MG disintegrating tablet Take 10 mg by mouth daily.    Yes [provider]  FLUoxetine (PROZAC) 10 MG capsule Take 30 mg by mouth every morning.   Yes [provider]  fluticasone (FLONASE) 50 MCG/ACT nasal spray Place 2 sprays into both nostrils daily.   Yes [provider]  furosemide (LASIX) 20 MG tablet TAKE 1-2 TABLETS BY MOUTH EVERY DAY AS  NEEDED Patient taking differently: 20 mg mg DAILY in the morning (4am) 10/24/14  Yes [place]ique, Peter M, MD  GABAPENTIN PO Take 1 tablet by mouth at bedtime. For itching   Yes [provider]  gemfibrozil (LOPID) 600 MG tablet Take 600 mg by mouth.    Yes [provider]  glipiZIDE (GLUCOTROL) 5 MG tablet Take 2.5 mg by mouth 2 (two) times daily before a meal.    Yes [provider]  HYDROcodone-acetaminophen (NORCO) 5-325 MG tablet Take 1-2 tablets by mouth every 6 (six) hours as needed. 04/15/17  Yes Delo, Nathaneil Canary, MD  hydroxypropyl methylcellulose / hypromellose (ISOPTO TEARS / GONIOVISC) 2.5 % ophthalmic solution Place 1 drop into both eyes as needed for dry eyes.   Yes [provider]  lamoTRIgine (LAMICTAL) 100 MG tablet Take 1 tablet (100 mg total) by mouth 2 (two) times daily. 10/03/15  Yes Rosalin Hawking, MD  loperamide (IMODIUM) 2 MG capsule Take 2 mg by mouth as needed for diarrhea or loose stools.   Yes [provider]  meclizine (ANTIVERT) 12.5 MG tablet Take 1 tablet (12.5 mg total) by mouth 2 (two) times daily as needed for dizziness. 03/31/15  Yes Jaynee Eagles, PA-C  mirtazapine (REMERON SOL-TAB) 15 MG disintegrating tablet Take 15 mg by mouth.    Yes [provider]  Multiple Vitamin (MULTIVITAMIN) capsule Take 1  capsule by mouth daily.   Yes [provider]  nitroGLYCERIN (NITROSTAT) 0.4 MG SL tablet Place 1 tablet (0.4 mg total) under the tongue every 5 (five) minutes as needed. For chest pain 03/10/15  Yes Martinique, Peter M, MD  omeprazole (PRILOSEC) 20 MG capsule Take 20 mg by mouth daily.    Yes [provider]  potassium citrate (UROCIT-K) 10 MEQ (1080 MG) SR tablet Take 10 mEq by mouth 3 (three) times daily with meals.  07/08/14  Yes [provider]  sitaGLIPtin (JANUVIA) 100 MG tablet Take 100 mg by mouth every morning. Reported on 09/19/2015   Yes [provider]  traZODone (DESYREL) 100 MG tablet Take 100 mg by mouth at bedtime.    Yes [provider]  Triamcinolone Acetonide (TRIAMCINOLONE 0.1 % CREAM : EUCERIN) CREA Apply 1 application topically daily. TRIAMCINOLONE 1%/ Absorbase   Yes [provider]  ONE TOUCH ULTRA TEST test strip USE TO TEST BLOOD SUGAR DAILY 07/11/15   Darlyne Russian, MD  oxyCODONE-acetaminophen (PERCOCET) 5-325 MG tablet Take 1 tablet by mouth every 4 (four) hours as needed (for pain). Patient not taking: Reported on 04/24/2017 03/05/17   Shanon Rosser, MD    Physical Exam:  Vitals:   04/24/17 1630 04/24/17 1645 04/24/17 1700 04/24/17 1745  BP: 134/75 (!) 146/73 137/64 137/72  Pulse: 77 81  80  Resp: 19 16 16 15   Temp:      TempSrc:      SpO2:   95%     Constitutional: NAD, calm, comfortable Eyes: PERRL, lids and conjunctivae normal ENMT: Mucous membranes are moist. Posterior pharynx clear of any exudate or lesions.Normal dentition.  Neck: normal, supple, no masses, no thyromegaly Respiratory: clear to auscultation bilaterally, no wheezing, no crackles. Normal respiratory effort. No accessory muscle use.  Cardiovascular: Regular rate and rhythm, no murmurs / rubs / gallops. No extremity edema. 2+ pedal pulses. No carotid bruits.  Abdomen: no tenderness, no masses palpated. No hepatosplenomegaly. Bowel sounds  positive.  Musculoskeletal: no clubbing / cyanosis. No joint deformity upper and lower extremities. Good ROM, no contractures. Normal  muscle tone.  Skin: no rashes, lesions, ulcers. No induration Neurologic: CN 2-12 grossly intact. Sensation intact, DTR normal. Strength 5/5 in all 4.  Psychiatric: Normal judgment and insight. Alert and oriented x 3. Normal mood.   Labs on Admission: I have personally reviewed following labs and imaging studies  CBC:  Recent Labs Lab 04/24/17 1409  WBC 9.0  NEUTROABS 6.5  HGB 11.6*  HCT 36.8*  MCV 97.1  PLT 469   Basic Metabolic Panel:  Recent Labs Lab 04/24/17 1409  NA 139  K 3.9  CL 101  CO2 27  GLUCOSE 142*  BUN 18  CREATININE 0.91  CALCIUM 8.9   GFR: Estimated Creatinine Clearance: 77.2 mL/min (by C-G formula based on SCr of 0.91 mg/dL). Liver Function Tests:  Recent Labs Lab 04/24/17 1409  AST 38  ALT 29  ALKPHOS 115  BILITOT 0.5  PROT 7.2  ALBUMIN 3.5   No results for input(s): LIPASE, AMYLASE in the last 168 hours. No results for input(s): AMMONIA in the last 168 hours. Coagulation Profile: No results for input(s): INR, PROTIME in the last 168 hours. Cardiac Enzymes: No results for input(s): CKTOTAL, CKMB, CKMBINDEX, TROPONINI in the last 168 hours. BNP (last 3 results) No results for input(s): PROBNP in the last 8760 hours. HbA1C: No results for input(s): HGBA1C in the last 72 hours. CBG: No results for input(s): GLUCAP in the last 168 hours. Lipid Profile: No results for input(s): CHOL, HDL, LDLCALC, TRIG, CHOLHDL, LDLDIRECT in the last 72 hours. Thyroid Function Tests: No results for input(s): TSH, T4TOTAL, FREET4, T3FREE, THYROIDAB in the last 72 hours. Anemia Panel: No results for input(s): VITAMINB12, FOLATE, FERRITIN, TIBC, IRON, RETICCTPCT in the last 72 hours. Urine analysis:    Component Value Date/Time   COLORURINE YELLOW 04/24/2017 1526   APPEARANCEUR CLEAR 04/24/2017 1526   LABSPEC 1.025  04/24/2017 1526   PHURINE 5.0 04/24/2017 1526   GLUCOSEU NEGATIVE 04/24/2017 1526   HGBUR SMALL (A) 04/24/2017 1526   BILIRUBINUR NEGATIVE 04/24/2017 1526   BILIRUBINUR neg 04/27/2014 1205   KETONESUR NEGATIVE 04/24/2017 1526   PROTEINUR 100 (A) 04/24/2017 1526   UROBILINOGEN 0.2 03/07/2015 1005   NITRITE NEGATIVE 04/24/2017 1526   LEUKOCYTESUR NEGATIVE 04/24/2017 1526   Sepsis Labs: !!!!!!!!!!!!!!!!!!!!!!!!!!!!!!!!!!!!!!!!!!!! Invalid input(s): PROCALCITONIN, LACTICIDVEN No results found for this or any previous visit (from the past 240 hour(s)).   Radiological Exams on Admission: Dg Chest Portable 1 View  Result Date: 04/24/2017 CLINICAL DATA:  Chest and back pain.  Fall. EXAM: PORTABLE CHEST 1 VIEW COMPARISON:  Chest/rib radiographs 04/15/2017 FINDINGS: Sequelae of prior CABG are again identified. An implanted loop recorder is noted. The cardiac silhouette remains mildly enlarged. No confluent airspace opacity, edema, sizable pleural effusion, or pneumothorax is identified. No acute osseous abnormality is seen. IMPRESSION: No active disease. Electronically Signed   By: Logan Bores M.D.   On: 04/24/2017 14:30   Ct Angio Chest/abd/pel For Dissection W And/or W/wo  Result Date: 04/24/2017 CLINICAL DATA:  Right flank pain following a fall last week. Back pain. Diaphoresis. Chest pain. Clinical concern for aortic dissection. EXAM: CT ANGIOGRAPHY CHEST, ABDOMEN AND PELVIS TECHNIQUE: Multidetector CT imaging through the chest, abdomen and pelvis was performed using the standard protocol during bolus administration of intravenous contrast. Multiplanar reconstructed images and MIPs were obtained and reviewed to evaluate the vascular anatomy. CONTRAST:  100 cc Isovue 370 COMPARISON:  Abdomen and pelvis CT dated 03/05/2017 and chest CTA dated 05/19/2014. FINDINGS: CTA CHEST FINDINGS Cardiovascular: Post CABG  changes. Extensive atheromatous arterial calcifications, including the aorta and coronary  arteries. Normal sized heart. No pericardial effusion. No aortic aneurysm or dissection. Mediastinum/Nodes: No enlarged mediastinal, hilar, or axillary lymph nodes. Thyroid gland, trachea, and esophagus demonstrate no significant findings other than mild diffuse distal esophageal wall thickening. Lungs/Pleura: Minimal right basilar atelectasis or scarring. Bilateral calcified pleural plaques. Musculoskeletal: Thoracic spine degenerative changes. Review of the MIP images confirms the above findings. CTA ABDOMEN AND PELVIS FINDINGS VASCULAR Aorta: Atheromatous calcifications without aneurysm or dissection. Celiac: Patent without evidence of aneurysm, dissection, vasculitis or significant stenosis. SMA: Patent without evidence of aneurysm, dissection, vasculitis or significant stenosis. Renals: High-grade stenosis of the proximal right renal artery with approximately 85% narrowing. Proximal left renal artery atheromatous calcifications without significant luminal narrowing. IMA: Proximal calcifications without significant luminal narrowing. Inflow: Atheromatous calcifications without aneurysm or significant stenoses. Veins: No obvious venous abnormality within the limitations of this arterial phase study. Review of the MIP images confirms the above findings. NON-VASCULAR Hepatobiliary: No focal liver abnormality is seen. No gallstones, gallbladder wall thickening, or biliary dilatation. Pancreas: Unremarkable. No pancreatic ductal dilatation or surrounding inflammatory changes. Spleen: Normal in size without focal abnormality. Adrenals/Urinary Tract: Adrenal glands are unremarkable. Kidneys are normal, without renal calculi, focal lesion, or hydronephrosis. Bladder is unremarkable. Stomach/Bowel: Status post subtotal colectomy with a small bowel to sigmoid colon anastomosis. No interval abnormal bowel dilatation. Unremarkable stomach. Lymphatic: No significant vascular findings are present. No enlarged abdominal or  pelvic lymph nodes. Reproductive: Small prostate gland. Penile prosthesis with a right pelvic reservoir. Other: Midline surgical scar.  No hernia or free peritoneal fluid. Musculoskeletal: L5-S1 pedicle screw and rod fixation with interbody bone plug. Mild degenerative changes at the remaining levels of the lumbar spine. Review of the MIP images confirms the above findings. IMPRESSION: 1. No aortic dissection or acute abnormality. 2. Approximately 85% stenosis of the proximal right renal artery. 3. Calcified coronary artery and aortic atherosclerosis. 4. Mild diffuse distal esophageal wall thickening, most likely due to reflux esophagitis. Electronically Signed   By: Claudie Revering M.D.   On: 04/24/2017 17:07    EKG: Independently reviewed.   Assessment/Plan Active Problems:   Depression   Type 2 diabetes mellitus with circulatory disorder (HCC)   Essential hypertension   Cerebrovascular accident (CVA) due to embolism of right anterior cerebral artery (HCC)   General weakness    Generalized weakness and recurrent falls -Patient has history of cerebrovascular accident with some residual deficit, but does not need a walker inside the house. -Had 3 falls in the past 2 weeks and now couldn't get up. -No symptoms or signs of infection, clear chest x-ray and urinalysis. -Trace pedal edema 1 dose of IV Lasix, follow with PT/OT and home walk for home oxygen.  Chronic respiratory failure with hypoxia -Patient used to be on 3 L of oxygen all the time, per wife the [place] discontinued his oxygen concentrator. -Now he is on BiPAP at night with 3 L of oxygen but the daytime he walks without it. -This might be contributing to his generalized weakness and falls as he might be hypoxic with ambulation. -PT/OT and check oxygen with walking.  Cerebrovascular accident -Per neurology records no history of fish fibrillation but patient is on Eliquis, continue.  Type 2 diabetes mellitus -Check hemoglobin A1c, hold  oral hypoglycemic agents. -Placed on SSI.  DVT prophylaxis: Eliquis Code Status: Full code Family Communication: Plan D/W patient the presence of his wife at bedside Disposition Plan: Home Consults  called:  Admission status: Telemetry, observation   Birdie Hopes MD [facility] Pager [phone]  If 7PM-7AM, please contact night-coverage [email or web address] Password [facility]  04/24/2017, 6:32 PM

## 2017-04-24 NOTE — ED Notes (Signed)
Pharmacy at bedside

## 2017-04-24 NOTE — ED Notes (Signed)
Patient made aware of dinner tray being ordered

## 2017-04-24 NOTE — ED Provider Notes (Signed)
[name] Provider Note   CSN: [id number] Arrival date & time: 04/24/17  1358     History   Chief Complaint Chief Complaint  Patient presents with  . Rib Injury  . Chest Pain    HPI [name] is a 71 y.o. male.  HPI 71 year old Caucasian male past medical history significant for CAD, atrial fibrillation currently on anticoagulation, diabetes, prior CVA that presents to the ED with complaints of left lateral chest wall and back pain. Patient presents by EMS from home. Wife is at bedside. States that patient did fall on 8/21 after trying to attempt to get up from toilet. He struck the back of his back on the bed frame. The patient was complaining of pain in his right ribs. Patient also states that he fell one week prior to that in the bathroom and injured his left ribs. Patient reports pain with movement and inspiration. Patient was evaluated in the ED at that time. Did note a fractured nondisplaced rib. The patient has been at home and pain has not been managed at home. Wife states that over the past 2 days the pain has worsened. He is having difficulties getting out of bed due to the pain. Patient is on 3 L of oxygen due to asbestos at baseline. Wife also states that patient has become slightly more pale and to. States the pain is not manageable. Does report episode of emesis today. Patient also states history of kidney stones. States that he denies any urinary symptoms but this does feel like his prior kidney stones. Pain is unrelieved by narcotic pain medicine at home.  Pt denies any fever, chill, ha, vision changes, lightheadedness, dizziness, congestion, neck pain, cough, abd pain, n/v/d, urinary symptoms, change in bowel habits, melena, hematochezia, lower extremity paresthesias.  Past Medical History:  Diagnosis Date  . AKI (acute kidney injury) ([place])    a. 6/14: resolved after d/c ARB;  b. RA U/S 7/14: no RA stenosis  . Arthritis    of spine  . Atrial flutter ([place])    . Back pain    persistent  . Coronary artery disease    a. Low level exercise Lex MV 3/14: low risk, EF 62%, inf defect 2/2 diaph attenuation vs artifiact, small area of scar possible, no ischemia  . Depression   . Diabetes mellitus    controled by diet  . Diverticulitis   . GERD (gastroesophageal reflux disease)   . H/O alcohol abuse   . Headache(784.0)   . Hearing loss   . History of atrial flutter   . Hx of echocardiogram    Echo 7/14:  Mod LVH, EF 55-60%, Gr 1 DD, mild MR, PASP 36  . Hyperlipidemia   . Hypertension   . Renal stone 04/15/2014  . Seizures ([place])   . Shortness of breath   . Sleep apnea    does not use CPAP  . Stroke [facility])     Patient Active Problem List   Diagnosis Date Noted  . Cerebrovascular accident (CVA) due to embolism of right anterior cerebral artery ([place]) 09/19/2015  . History of CVA with residual deficit 09/19/2015  . Type 2 diabetes mellitus with circulatory disorder ([place]) 07/12/2015  . HLD (hyperlipidemia) 07/12/2015  . Essential hypertension 07/12/2015  . Coronary artery disease involving coronary bypass graft of native heart with unspecified angina pectoris 07/12/2015  . Intracranial carotid stenosis 07/12/2015  . Morbid obesity ([place]) 02/21/2015  . Acute ischemic stroke ([place])   . Dysarthria   .  History of pulmonary embolism   . Stroke ([place]) 02/11/2015  . CVA (cerebral infarction) 02/11/2015  . Acute right ACA stroke ([place]) 02/11/2015  . Lower extremity weakness 01/08/2015  . Status post placement of implantable loop recorder 08/01/2014  . Chronic respiratory failure, unspecified whether with hypoxia or hypercapnia ([place]) 05/19/2014  . Pulmonary embolism ([place]) 05/19/2014  . Hemiparesis and speech and language deficit as late effects of stroke ([place]) 04/19/2014  . Ureteral colic 43/32/9518  . Left ureteral stone 04/15/2014  . Obstructive sleep apnea 12/07/2013  . Obesity, unspecified 12/07/2013  . Embolic stroke ([place]) 84/16/6063  . Cough  06/09/2013  . S/P lumbar spine operation 06/02/2013  . Nerve root pain 06/02/2013  . Pleural plaque consistent with asbestos exposure by CT 10/2012 02/17/2013  . Renal failure 02/13/2013  . Dyspnea 02/11/2013  . Dysphagia, pharyngoesophageal phase 05/12/2012  . Seizure disorder ([place]) 04/04/2012  . CAD (coronary artery disease) 06/13/2011  . HTN (hypertension) 06/13/2011  . Diabetes mellitus ([place]) 06/13/2011  . History of seizure disorder   . Depression   . Back pain   . Hearing loss   . Arrhythmia   . Diverticulitis   . H/O alcohol abuse   . Personal history of other diseases of circulatory system 05/29/2011    Past Surgical History:  Procedure Laterality Date  . APPENDECTOMY    . BACK SURGERY    . CARDIAC CATHETERIZATION    . CERVICAL FUSION    . CORONARY ARTERY BYPASS GRAFT  2005   LIMA graft to LAD,saphenous vein graft to diag.,circumflex, marginal,and to the RCA  . CYSTOSCOPY/RETROGRADE/URETEROSCOPY/STONE EXTRACTION WITH BASKET Left 04/16/2014   Procedure: CYSTOSCOPY/RETROGRADE/LEFT URETEROSCOPY/STONE EXTRACTION WITH BASKET AND LASER LITHOTRIPSY;  Surgeon: Malka So, MD;  Location: [facility];  Service: Urology;  Laterality: Left;  With STENT  . ESOPHAGOGASTRODUODENOSCOPY  02/10/2012   Procedure: ESOPHAGOGASTRODUODENOSCOPY (EGD);  Surgeon: Cleotis Nipper, MD;  Location: [facility] ENDOSCOPY;  Service: Endoscopy;  Laterality: N/A;  . ESOPHAGOGASTRODUODENOSCOPY  06/29/2012   Procedure: ESOPHAGOGASTRODUODENOSCOPY (EGD);  Surgeon: Winfield Cunas., MD;  Location: [facility] ENDOSCOPY;  Service: Endoscopy;  Laterality: N/A;  . FOREIGN BODY REMOVAL  02/10/2012   Procedure: FOREIGN BODY REMOVAL;  Surgeon: Cleotis Nipper, MD;  Location: [place];  Service: Endoscopy;  Laterality: N/A;  . FOREIGN BODY REMOVAL  06/29/2012   Procedure: FOREIGN BODY REMOVAL;  Surgeon: Winfield Cunas., MD;  Location: [facility] ENDOSCOPY;  Service: Endoscopy;  Laterality: N/A;  . LEFT HEART CATHETERIZATION WITH  CORONARY ANGIOGRAM N/A 04/02/2012   Procedure: LEFT HEART CATHETERIZATION WITH CORONARY ANGIOGRAM;  Surgeon: Sinclair Grooms, MD;  Location: [facility] CATH LAB;  Service: Cardiovascular;  Laterality: N/A;  . LOOP RECORDER IMPLANT N/A 11/17/2013   Procedure: LOOP RECORDER IMPLANT;  Surgeon: Deboraha Sprang, MD;  Location: [facility] CATH LAB;  Service: Cardiovascular;  Laterality: N/A;  . ORCHIECTOMY    . PARTIAL COLECTOMY     for diverticuli  . SAVORY DILATION  06/29/2012   Procedure: SAVORY DILATION;  Surgeon: Winfield Cunas., MD;  Location: [facility] ENDOSCOPY;  Service: Endoscopy;  Laterality: N/A;  . TEE WITHOUT CARDIOVERSION N/A 11/17/2013   Procedure: TRANSESOPHAGEAL ECHOCARDIOGRAM (TEE);  Surgeon: Larey Dresser, MD;  Location: [place];  Service: Cardiovascular;  Laterality: N/A;  . UMBILICAL HERNIA REPAIR         Home Medications    Prior to Admission medications   Medication Sig Start Date End Date Taking? Authorizing Provider  acetaminophen (TYLENOL) 500 MG  tablet Take 500 mg by mouth every 6 (six) hours as needed.   Yes [provider]  apixaban (ELIQUIS) 5 MG TABS tablet TAKE 1 TABLET(5 MG) BY MOUTH TWICE DAILY 03/13/15  Yes [provider]  ARIPiprazole (ABILIFY) 5 MG tablet Take 5 mg by mouth daily.    Yes [provider]  aspirin EC 81 MG tablet Take 1 tablet (81 mg total) by mouth daily. 12/24/16  Yes Rosalin Hawking, MD  atorvastatin (LIPITOR) 80 MG tablet Take 1 tablet (80 mg total) by mouth daily. Patient taking differently: Take 80 mg by mouth at bedtime.  10/29/13  Yes Copland, Gay Filler, MD  carbamide peroxide (DEBROX) 6.5 % OTIC solution Place 5 drops into both ears 2 (two) times daily.   Yes [provider]  donepezil (ARICEPT ODT) 10 MG disintegrating tablet Take 10 mg by mouth daily.    Yes [provider]  FLUoxetine (PROZAC) 10 MG capsule Take 30 mg by mouth every morning.   Yes [provider]  fluticasone (FLONASE) 50 MCG/ACT  nasal spray Place 2 sprays into both nostrils daily.   Yes [provider]  furosemide (LASIX) 20 MG tablet TAKE 1-2 TABLETS BY MOUTH EVERY DAY AS NEEDED Patient taking differently: 20 mg mg DAILY in the morning (4am) 10/24/14  Yes Martinique, Peter M, MD  GABAPENTIN PO Take 1 tablet by mouth at bedtime. For itching   Yes [provider]  gemfibrozil (LOPID) 600 MG tablet Take 600 mg by mouth.    Yes [provider]  glipiZIDE (GLUCOTROL) 5 MG tablet Take 2.5 mg by mouth 2 (two) times daily before a meal.    Yes [provider]  HYDROcodone-acetaminophen (NORCO) 5-325 MG tablet Take 1-2 tablets by mouth every 6 (six) hours as needed. 04/15/17  Yes Delo, Nathaneil Canary, MD  hydroxypropyl methylcellulose / hypromellose (ISOPTO TEARS / GONIOVISC) 2.5 % ophthalmic solution Place 1 drop into both eyes as needed for dry eyes.   Yes [provider]  lamoTRIgine (LAMICTAL) 100 MG tablet Take 1 tablet (100 mg total) by mouth 2 (two) times daily. 10/03/15  Yes Rosalin Hawking, MD  loperamide (IMODIUM) 2 MG capsule Take 2 mg by mouth as needed for diarrhea or loose stools.   Yes [provider]  meclizine (ANTIVERT) 12.5 MG tablet Take 1 tablet (12.5 mg total) by mouth 2 (two) times daily as needed for dizziness. 03/31/15  Yes Jaynee Eagles, PA-C  mirtazapine (REMERON SOL-TAB) 15 MG disintegrating tablet Take 15 mg by mouth.    Yes [provider]  Multiple Vitamin (MULTIVITAMIN) capsule Take 1 capsule by mouth daily.   Yes [provider]  nitroGLYCERIN (NITROSTAT) 0.4 MG SL tablet Place 1 tablet (0.4 mg total) under the tongue every 5 (five) minutes as needed. For chest pain 03/10/15  Yes Martinique, Peter M, MD  omeprazole (PRILOSEC) 20 MG capsule Take 20 mg by mouth daily.    Yes [provider]  potassium citrate (UROCIT-K) 10 MEQ (1080 MG) SR tablet Take 10 mEq by mouth 3 (three) times daily with meals.  07/08/14  Yes [provider]    sitaGLIPtin (JANUVIA) 100 MG tablet Take 100 mg by mouth every morning. Reported on 09/19/2015   Yes [provider]  traZODone (DESYREL) 100 MG tablet Take 100 mg by mouth at bedtime.    Yes [provider]  Triamcinolone Acetonide (TRIAMCINOLONE 0.1 % CREAM : EUCERIN) CREA Apply 1 application topically daily. TRIAMCINOLONE 1%/ Absorbase  Yes [provider]  ONE TOUCH ULTRA TEST test strip USE TO TEST BLOOD SUGAR DAILY 07/11/15   Darlyne Russian, MD  oxyCODONE-acetaminophen (PERCOCET) 5-325 MG tablet Take 1 tablet by mouth every 4 (four) hours as needed (for pain). Patient not taking: Reported on 04/24/2017 03/05/17   Molpus, [name], MD    Family History Family History  Problem Relation Age of Onset  . Emphysema Mother        was a smoker  . Asthma Mother   . Hypertension Mother   . Heart disease Father   . Prostate cancer Paternal Grandfather   . Pancreatic cancer Paternal Uncle   . Stroke Paternal Grandmother   . Heart attack Neg Hx     Social History Social History  Substance Use Topics  . Smoking status: Never Smoker  . Smokeless tobacco: Never Used  . Alcohol use No     Comment: rare beer     Allergies   Cephalexin; Doxycycline; Keflex [cephalexin]; Lac bovis; Pentazocine lactate; Talwin [pentazocine]; Tape; and Zanaflex [tizanidine hcl]   Review of Systems Review of Systems  Constitutional: Negative for chills and fever.  HENT: Negative for congestion and sore throat.   Eyes: Negative for visual disturbance.  Respiratory: Positive for shortness of breath. Negative for cough and wheezing.   Cardiovascular: Positive for chest pain (chest wall).  Gastrointestinal: Negative for abdominal pain, diarrhea, nausea and vomiting.  Genitourinary: Negative for dysuria, flank pain, frequency, hematuria, scrotal swelling, testicular pain and urgency.  Musculoskeletal: Positive for back pain. Negative for arthralgias and myalgias.  Skin: Negative for  rash.  Neurological: Positive for weakness. Negative for dizziness, syncope, light-headedness, numbness and headaches.  Psychiatric/Behavioral: Negative for sleep disturbance. The patient is not nervous/anxious.      Physical Exam Updated Vital Signs BP 122/72   Pulse 82   Temp 98.6 F (37 C) (Oral)   Resp 20   SpO2 96%   Physical Exam  Constitutional: He is oriented to person, place, and time. He appears well-developed and well-nourished.  Non-toxic appearance. No distress.  Patient appears pale and mildly diaphoretic.  HENT:  Head: Normocephalic and atraumatic.  Mouth/Throat: Oropharynx is clear and moist.  Eyes: Pupils are equal, round, and reactive to light. Conjunctivae are normal. Right eye exhibits no discharge. Left eye exhibits no discharge.  Neck: Normal range of motion. Neck supple.  Cardiovascular: Normal rate, regular rhythm, normal heart sounds and intact distal pulses.  Exam reveals no gallop and no friction rub.   No murmur heard. Pulmonary/Chest: Effort normal and breath sounds normal. No respiratory distress. He has no wheezes. He has no rales. He exhibits tenderness.  Diminished breath sounds throughout. Patient with tenderness to palpation of lateral and posterior left chest wall and rib.  On 3l of o2 at baseline and sat at 96%n noted tachypnea noted.  No ecchymosis, step-off, deformity noted.  Abdominal: Soft. Bowel sounds are normal. He exhibits no distension and no pulsatile midline mass. There is no tenderness. There is no rigidity, no rebound, no guarding, no CVA tenderness, no tenderness at McBurney's point and negative Murphy's sign.  Musculoskeletal: Normal range of motion. He exhibits no tenderness.  Lymphadenopathy:    He has no cervical adenopathy.  Neurological: He is alert and oriented to person, place, and time.  Alert and oriented. Answers questions appropriately.  Skin: Skin is warm and dry. Capillary refill takes less than 2 seconds. No rash  noted. There is pallor.  Psychiatric: His behavior is normal.  Judgment and thought content normal.  Nursing note and vitals reviewed.    ED Treatments / Results  Labs (all labs ordered are listed, but only abnormal results are displayed) Labs Reviewed  COMPREHENSIVE METABOLIC PANEL - Abnormal; Notable for the following:       Result Value   Glucose, Bld 142 (*)    All other components within normal limits  CBC WITH DIFFERENTIAL/PLATELET - Abnormal; Notable for the following:    RBC 3.79 (*)    Hemoglobin 11.6 (*)    HCT 36.8 (*)    All other components within normal limits  URINALYSIS, ROUTINE W REFLEX MICROSCOPIC - Abnormal; Notable for the following:    Hgb urine dipstick SMALL (*)    Protein, ur 100 (*)    All other components within normal limits  I-STAT TROPONIN, ED    EKG  EKG Interpretation None       Radiology Dg Chest Portable 1 View  Result Date: 04/24/2017 CLINICAL DATA:  Chest and back pain.  Fall. EXAM: PORTABLE CHEST 1 VIEW COMPARISON:  Chest/rib radiographs 04/15/2017 FINDINGS: Sequelae of prior CABG are again identified. An implanted loop recorder is noted. The cardiac silhouette remains mildly enlarged. No confluent airspace opacity, edema, sizable pleural effusion, or pneumothorax is identified. No acute osseous abnormality is seen. IMPRESSION: No active disease. Electronically Signed   By: Logan Bores M.D.   On: 04/24/2017 14:30    Procedures Procedures (including critical care time)  Medications Ordered in ED Medications  iopamidol (ISOVUE-370) 76 % injection (not administered)  morphine 4 MG/ML injection 4 mg (not administered)  ondansetron (ZOFRAN) injection 4 mg (not administered)     Initial Impression / Assessment and Plan / ED Course  I have reviewed the triage vital signs and the nursing notes.  Pertinent labs & imaging results that were available during my care of the patient were reviewed by me and considered in my medical decision  making (see chart for details).     Patient resents to the ED today from home with wife at bedside. Patient on 3 L of oxygen at home at baseline. Had a fall one half weeks ago. Seen in ED and diagnosed with a fractured rib. Wife states the pain has been uncontrolled at home. States that he has had increased shortness of breath, pain.  On exam patient does appear pale and mildly diaphoretic. Complains of pain of his left lateral and left posterior chest wall and flank. No ecchymosis, deformity noted. Vital signs are reassuring. Patient satting at 96% on 3 L which is baseline for patient. No tachypnea noted. Patient is afebrile.  No abdominal tenderness to palpation. Patient is mentating appropriately. Patient with diminished breath sounds and no crackles or rhonchi noted. No wheezing noted.  Lab work has been reassuring. No leukocytosis. Hemoglobin at baseline. Normal electrolytes. UA shows some small amount of RBCs. No other signs of infection noted.  Portal chest was ordered that showed no acute abnormalities. EKG does show some T-wave inversions but no signs of acute ischemia. Troponin was negative.  Awaiting CT scan at this time to rule out dissection, pneumonia, kidney stone. Patient's symptoms do not seem consistent with ACS. CT scans are pending at this time. Patient given pain medicine and nausea medicine.  Care handoff to [place]. Pt has pending at this time ct scan, reassessment, and repeat trop.  Disposition likely admission pending lab and test results. Care dicussed and plan agreed upon with oncoming PA. Pt updated on plan  of care and is currently hemodynamically stable at this time with normal vs.    Pt seen and examined by Dr. Ralene Bathe who is agreeable to the above plan.    Final Clinical Impressions(s) / ED Diagnoses   Final diagnoses:  None    New Prescriptions New Prescriptions   No medications on file     [name] 04/24/17 1632    Jacob Reichert,  MD 04/25/17 [phone]

## 2017-04-24 NOTE — Telephone Encounter (Signed)
Returned call and spoke with Katrina. Informed her that patient has not been seen since 01/04/2016 and has not had any medications sent by our office. Katrina thanked office for the assistance, no further needs at this time./ S.Monterrius Cardosa,CMA

## 2017-04-24 NOTE — ED Notes (Signed)
Patient transported to CT 

## 2017-04-24 NOTE — ED Provider Notes (Signed)
  Physical Exam  BP 122/72   Pulse 82   Temp 98.6 F (37 C) (Oral)   Resp 20   SpO2 96%   Physical Exam  Constitutional: He appears well-developed and well-nourished. No distress.  Chronically ill-appearing, obese male. Nasal cannula in place.  HENT:  Head: Normocephalic and atraumatic.  Eyes: Conjunctivae and EOM are normal. No scleral icterus.  Neck: Normal range of motion.  Pulmonary/Chest: Effort normal. No respiratory distress.  Neurological: He is alert.  Skin: No rash noted. He is not diaphoretic.  Psychiatric: He has a normal mood and affect.  Nursing note and vitals reviewed.   ED Course  Procedures  MDM Care handed off from [place]. Briefly, patient with a past medical history of CAD, A. Fib currently on anticoagulation, diabetes, prior CVA, asbestos exposure on chronic oxygen, presents to ED for left lateral chest wall and back pain. Does have a history of fall one week ago. Also has a history of kidney stones. Urinalysis showed. Initial and troponin negative. EKG showed new ST depressions and T-wave inversions. CT dissection study negative with no evidence of kidney stones, pneumonia. Patient will need to be admitted for EKG changes and chest pain workup. [facility] consult hospitalist for admission and consult cardiology.  Cardiology states that patient's EKG appears similar to previous tracing in April and since troponin is negative, they do not need to follow. Spoke to hospitalist for admission and chest pain workup. Appreciate the help of hospitalist with this patient.     Delia Heady, PA-C 04/24/17 1809    Macarthur Critchley, MD 04/24/17 [phone]

## 2017-04-24 NOTE — Telephone Encounter (Signed)
Rn receive call from [name](CMA)at patients PCP office.She stated pt last seen DR.Daub in May 2017, for diabetic care to discuss diabetes, and blood work. She stated the visit turn into other medical problems. The patient never follow up for diabetes. Rn stated [facility] a referral for ed visit 02/2017 and 03/2017 that was not a stroke work up. Rn stated per Dr. Erlinda Hong he cannot sign any orders for therapy in 02/2017 and 03/2017. Pt will be seen on 04/29/2017 for stroke and seizure follow up. [name] stated their office has not done any refills or prescribed any medication to pt since 2016.She thinks pt is going to [place] for blood pressure,and diabetic medication.

## 2017-04-25 ENCOUNTER — Encounter (HOSPITAL_COMMUNITY): Payer: Self-pay | Admitting: General Practice

## 2017-04-25 DIAGNOSIS — D72829 Elevated white blood cell count, unspecified: Secondary | ICD-10-CM | POA: Diagnosis not present

## 2017-04-25 DIAGNOSIS — R296 Repeated falls: Secondary | ICD-10-CM | POA: Diagnosis present

## 2017-04-25 DIAGNOSIS — Z8673 Personal history of transient ischemic attack (TIA), and cerebral infarction without residual deficits: Secondary | ICD-10-CM | POA: Diagnosis not present

## 2017-04-25 DIAGNOSIS — Z9981 Dependence on supplemental oxygen: Secondary | ICD-10-CM | POA: Diagnosis not present

## 2017-04-25 DIAGNOSIS — E1165 Type 2 diabetes mellitus with hyperglycemia: Secondary | ICD-10-CM | POA: Diagnosis present

## 2017-04-25 DIAGNOSIS — I2510 Atherosclerotic heart disease of native coronary artery without angina pectoris: Secondary | ICD-10-CM | POA: Diagnosis present

## 2017-04-25 DIAGNOSIS — Z77090 Contact with and (suspected) exposure to asbestos: Secondary | ICD-10-CM | POA: Diagnosis present

## 2017-04-25 DIAGNOSIS — Z951 Presence of aortocoronary bypass graft: Secondary | ICD-10-CM | POA: Diagnosis not present

## 2017-04-25 DIAGNOSIS — Z86711 Personal history of pulmonary embolism: Secondary | ICD-10-CM | POA: Diagnosis not present

## 2017-04-25 DIAGNOSIS — I4891 Unspecified atrial fibrillation: Secondary | ICD-10-CM | POA: Diagnosis present

## 2017-04-25 DIAGNOSIS — Z823 Family history of stroke: Secondary | ICD-10-CM | POA: Diagnosis not present

## 2017-04-25 DIAGNOSIS — Z8249 Family history of ischemic heart disease and other diseases of the circulatory system: Secondary | ICD-10-CM | POA: Diagnosis not present

## 2017-04-25 DIAGNOSIS — R509 Fever, unspecified: Secondary | ICD-10-CM | POA: Diagnosis not present

## 2017-04-25 DIAGNOSIS — E1159 Type 2 diabetes mellitus with other circulatory complications: Secondary | ICD-10-CM | POA: Diagnosis not present

## 2017-04-25 DIAGNOSIS — R072 Precordial pain: Secondary | ICD-10-CM

## 2017-04-25 DIAGNOSIS — I10 Essential (primary) hypertension: Secondary | ICD-10-CM | POA: Diagnosis not present

## 2017-04-25 DIAGNOSIS — R531 Weakness: Secondary | ICD-10-CM

## 2017-04-25 DIAGNOSIS — E785 Hyperlipidemia, unspecified: Secondary | ICD-10-CM | POA: Diagnosis present

## 2017-04-25 DIAGNOSIS — H9190 Unspecified hearing loss, unspecified ear: Secondary | ICD-10-CM | POA: Diagnosis present

## 2017-04-25 DIAGNOSIS — I63421 Cerebral infarction due to embolism of right anterior cerebral artery: Secondary | ICD-10-CM | POA: Diagnosis not present

## 2017-04-25 DIAGNOSIS — K219 Gastro-esophageal reflux disease without esophagitis: Secondary | ICD-10-CM | POA: Diagnosis present

## 2017-04-25 DIAGNOSIS — F329 Major depressive disorder, single episode, unspecified: Secondary | ICD-10-CM | POA: Diagnosis present

## 2017-04-25 DIAGNOSIS — Z87442 Personal history of urinary calculi: Secondary | ICD-10-CM | POA: Diagnosis not present

## 2017-04-25 DIAGNOSIS — Z9049 Acquired absence of other specified parts of digestive tract: Secondary | ICD-10-CM | POA: Diagnosis not present

## 2017-04-25 DIAGNOSIS — J9611 Chronic respiratory failure with hypoxia: Secondary | ICD-10-CM | POA: Diagnosis present

## 2017-04-25 DIAGNOSIS — G4730 Sleep apnea, unspecified: Secondary | ICD-10-CM | POA: Diagnosis present

## 2017-04-25 LAB — BASIC METABOLIC PANEL
Anion gap: 10 (ref 5–15)
BUN: 21 mg/dL — AB (ref 6–20)
CALCIUM: 9 mg/dL (ref 8.9–10.3)
CO2: 27 mmol/L (ref 22–32)
CREATININE: 0.98 mg/dL (ref 0.61–1.24)
Chloride: 102 mmol/L (ref 101–111)
GFR calc Af Amer: >60 mL/min (ref >60)
GLUCOSE: 145 mg/dL — AB (ref 65–99)
Potassium: 4.5 mmol/L (ref 3.5–5.1)
Sodium: 139 mmol/L (ref 135–145)

## 2017-04-25 LAB — CBC
HCT: 37.1 % — ABNORMAL LOW (ref 39.0–52.0)
Hemoglobin: 11.4 g/dL — ABNORMAL LOW (ref 13.0–17.0)
MCH: 30.2 pg (ref 26.0–34.0)
MCHC: 30.7 g/dL (ref 30.0–36.0)
MCV: 98.1 fL (ref 78.0–100.0)
Platelets: 296 10*3/uL (ref 150–400)
RBC: 3.78 MIL/uL — ABNORMAL LOW (ref 4.22–5.81)
RDW: 13.6 % (ref 11.5–15.5)
WBC: 10.6 10*3/uL — ABNORMAL HIGH (ref 4.0–10.5)

## 2017-04-25 LAB — GLUCOSE, CAPILLARY
GLUCOSE-CAPILLARY: 138 mg/dL — AB (ref 65–99)
GLUCOSE-CAPILLARY: 166 mg/dL — AB (ref 65–99)
Glucose-Capillary: 154 mg/dL — ABNORMAL HIGH (ref 65–99)
Glucose-Capillary: 195 mg/dL — ABNORMAL HIGH (ref 65–99)

## 2017-04-25 MED ORDER — GABAPENTIN 100 MG PO CAPS
100.0000 mg | ORAL_CAPSULE | Freq: Every day | ORAL | Status: DC
  Administered 2017-04-25: 100 mg via ORAL
  Filled 2017-04-25: qty 1

## 2017-04-25 MED ORDER — PANTOPRAZOLE SODIUM 40 MG PO TBEC
40.0000 mg | DELAYED_RELEASE_TABLET | Freq: Two times a day (BID) | ORAL | Status: DC
  Administered 2017-04-25 – 2017-04-26 (×2): 40 mg via ORAL
  Filled 2017-04-25 (×2): qty 1

## 2017-04-25 NOTE — Discharge Instructions (Signed)

## 2017-04-25 NOTE — Evaluation (Signed)
Occupational Therapy Evaluation Patient Details Name: [name] MRN: [id number] DOB: 1945/12/19 Today's Date: 04/25/2017    History of Present Illness [name] is a 71 y.o. male with medical history significant of stroke (basal ganglia lacunar infarcts) with residual deficit, history of PE in 2015, is still chronically anticoagulated with Eliquis. Patient came to the hospital because of generalized weakness and three falls in 2 weeks.   Clinical Impression   Pt with decline in function and safety with ADLs and ADL mobility with decreased strength, balance, endurance and back pain limiting mobility and function. Pt has aide 3 hours/day for min A with showers and LB ADLs. Pt would benefit from acute OT services to address impairments and maximize level of function and safety to return home O2 92% on 2L O2 at rest, up on RW on RA and O2 86%, with O2 applied ranged 88-90%      Follow Up Recommendations  Home health OT;Supervision/Assistance - 24 hour    Equipment Recommendations  None recommended by OT    Recommendations for Other Services       Precautions / Restrictions Precautions Precautions: Fall Restrictions Weight Bearing Restrictions: No      Mobility Bed Mobility Overal bed mobility: Needs Assistance Bed Mobility: Rolling;Sidelying to Sit Rolling: Supervision Sidelying to sit: HOB elevated;Mod assist       General bed mobility comments: pt up in recliner upon arrival  Transfers Overall transfer level: Needs assistance Equipment used: Rolling walker (2 wheeled) Transfers: Sit to/from Stand Sit to Stand: Min guard         General transfer comment: pulls up on walker, braces legs against bed    Balance Overall balance assessment: Needs assistance Sitting-balance support: Feet supported Sitting balance-Leahy Scale: Fair     Standing balance support: Bilateral upper extremity supported Standing balance-Leahy Scale: Poor Standing balance comment:  UE support for balance                           ADL either performed or assessed with clinical judgement   ADL Overall ADL's : Needs assistance/impaired     Grooming: Wash/dry hands;Wash/dry face;Sitting;Set up   Upper Body Bathing: Min guard;Sitting   Lower Body Bathing: Moderate assistance;Sitting/lateral leans   Upper Body Dressing : Min guard;Sitting   Lower Body Dressing: Sitting/lateral leans;Moderate assistance   Toilet Transfer: Min guard;RW;Stand-pivot;Cueing for Office manager Details (indicate cue type and reason): simulated from recliner - bed - recliner Toileting- Clothing Manipulation and Hygiene: Moderate assistance;Sit to/from stand       Functional mobility during ADLs: Min guard;Rolling walker;Cueing for safety       Vision Baseline Vision/History: Wears glasses Wears Glasses: Reading only Patient Visual Report: No change from baseline       Perception     Praxis      Pertinent Vitals/Pain Pain Assessment: 0-10 Pain Score: 6  Pain Location: L lower back Pain Descriptors / Indicators: Sharp Pain Intervention(s): Repositioned;Monitored during session     Hand Dominance Left   Extremity/Trunk Assessment Upper Extremity Assessment Upper Extremity Assessment: Generalized weakness   Lower Extremity Assessment Lower Extremity Assessment: Defer to PT evaluation RLE Deficits / Details: slower movements, tight hamstrings, strength WFL LLE Deficits / Details: slower movements, tight hamstrings, strength WFL       Communication Communication Communication: HOH   Cognition Arousal/Alertness: Awake/alert Behavior During Therapy: WFL for tasks assessed/performed Overall Cognitive Status: Within Functional Limits for tasks assessed  General Comments: slow processing, STM deficit   General Comments      pt pleasant and cooperative     Home Living Family/patient expects to be  discharged to:: Private residence Living Arrangements: Spouse/significant other;Other relatives Available Help at Discharge: Family;Available 24 hours/day Type of Home: House Home Access: Ramped entrance     Home Layout: Two level;Bed/bath upstairs Alternate Level Stairs-Number of Steps: 4 Alternate Level Stairs-Rails: Right Bathroom Shower/Tub: Occupational psychologist: Handicapped height     Home Equipment: Shower seat;Grab bars - tub/shower;Hand held Tourist information centre manager - 4 wheels;Walker - 2 wheels;Adaptive equipment Adaptive Equipment: Reacher Additional Comments: has an aide 3 hours a day for showers and LB dressing      Prior Functioning/Environment Level of Independence: Needs assistance  Gait / Transfers Assistance Needed: no device in the home, uses walker outside ADL's / Homemaking Assistance Needed: sitter assists with shower and LB dressing            OT Problem List: Decreased strength;Decreased activity tolerance;Impaired balance (sitting and/or standing);Pain;Decreased coordination      OT Treatment/Interventions: Self-care/ADL training;DME and/or AE instruction;Therapeutic activities;Balance training;Therapeutic exercise;Neuromuscular education;Patient/family education    OT Goals(Current goals can be found in the care plan section) Acute Rehab OT Goals Patient Stated Goal: To go home OT Goal Formulation: With patient Time For Goal Achievement: 05/02/17 Potential to Achieve Goals: Good ADL Goals Pt Will Perform Grooming: with min guard assist;standing Pt Will Perform Upper Body Bathing: with set-up;with supervision;sitting Pt Will Perform Lower Body Bathing: with min assist;sitting/lateral leans;sit to/from stand Pt Will Perform Upper Body Dressing: with set-up;with supervision;sitting Pt Will Transfer to Toilet: with supervision;bedside commode;ambulating;regular height toilet;grab bars Pt Will Perform Toileting - Clothing Manipulation and  hygiene: with min assist;sit to/from stand Pt Will Perform Tub/Shower Transfer: with min guard assist;with supervision;shower seat Additional ADL Goal #1: Pt will verbalize and demo 2/3 energy conservation techniques during ADLs and ADL mobility  OT Frequency: Min 2X/week   Barriers to D/C:    no barriers       Co-evaluation              AM-PAC PT "6 Clicks" Daily Activity     Outcome Measure Help from another person eating meals?: None Help from another person taking care of personal grooming?: A Little Help from another person toileting, which includes using toliet, bedpan, or urinal?: A Lot Help from another person bathing (including washing, rinsing, drying)?: A Lot Help from another person to put on and taking off regular upper body clothing?: A Little Help from another person to put on and taking off regular lower body clothing?: A Lot 6 Click Score: 16   End of Session Equipment Utilized During Treatment: Gait belt;Rolling walker;Oxygen  Activity Tolerance: Patient limited by fatigue Patient left: in chair;with call bell/phone within reach;with chair alarm set;with family/visitor present  OT Visit Diagnosis: Unsteadiness on feet (R26.81);Muscle weakness (generalized) (M62.81);Pain;Repeated falls (R29.6) Pain - Right/Left: Left Pain - part of body:  (back)                Time: 1610-9604 OT Time Calculation (min): 24 min Charges:  OT General Charges $OT Visit: 1 Visit OT Evaluation $OT Eval Moderate Complexity: 1 Mod OT Treatments $Therapeutic Activity: 8-22 mins G-Codes: OT G-codes **NOT FOR INPATIENT CLASS** Functional Assessment Tool Used: AM-PAC 6 Clicks Daily Activity Functional Limitation: Self care Self Care Current Status (V4098): At least 40 percent but less than 60 percent impaired, limited or restricted Self  Care Goal Status [phone]): At least 1 percent but less than 20 percent impaired, limited or restricted    Britt Bottom 04/25/2017,  1:30 PM

## 2017-04-25 NOTE — Evaluation (Addendum)
Physical Therapy Evaluation Patient Details Name: [name] MRN: [id number] DOB: 08/23/1946 Today's Date: 04/25/2017   History of Present Illness  [name] is a 71 y.o. male with medical history significant of stroke (basal ganglia lacunar infarcts) with residual deficit, history of PE in 2015, is still chronically anticoagulated with Eliquis. Patient came to the hospital because of generalized weakness and three falls in 2 weeks.  Clinical Impression  Patient presents with decreased independence with mobility due to stiffness, soreness with movement, decreased functional strength and balance and will benefit from skilled PT in the acute setting to allow return home with family assist and follow up HHPT.    Follow Up Recommendations Home health PT;Supervision/Assistance - 24 hour    Equipment Recommendations  None recommended by PT    Recommendations for Other Services       Precautions / Restrictions Precautions Precautions: Fall      Mobility  Bed Mobility Overal bed mobility: Needs Assistance Bed Mobility: Rolling;Sidelying to Sit Rolling: Supervision Sidelying to sit: HOB elevated;Mod assist       General bed mobility comments: cues for technique, assist to bring legs off bed and lift trunk with cues for hand placement  Transfers Overall transfer level: Needs assistance Equipment used: Rolling walker (2 wheeled) Transfers: Sit to/from Stand Sit to Stand: Min guard         General transfer comment: pulls up on walker, braces legs against bed  Ambulation/Gait Ambulation/Gait assistance: Min guard;Supervision Ambulation Distance (Feet): 140 Feet Assistive device: Rolling walker (2 wheeled) Gait Pattern/deviations: Step-to pattern;Step-through pattern;Shuffle;Decreased stride length;Wide base of support     General Gait Details: slow pace, stiff through trunk, but steady, see SpO2 measurements  Stairs            Wheelchair Mobility    Modified  Rankin (Stroke Patients Only)       Balance Overall balance assessment: Needs assistance Sitting-balance support: Feet supported Sitting balance-Leahy Scale: Fair     Standing balance support: Bilateral upper extremity supported Standing balance-Leahy Scale: Poor Standing balance comment: UE support for balance                             Pertinent Vitals/Pain Pain Assessment: 0-10 Pain Score: 7  Pain Location: L lower back Pain Descriptors / Indicators: Sharp (when breathing hard) Pain Intervention(s): Monitored during session    Home Living Family/patient expects to be discharged to:: Private residence Living Arrangements: Spouse/significant other;Other relatives (grandson) Available Help at Discharge: Family;Available 24 hours/day Type of Home: House Home Access: Ramped entrance     Home Layout: Two level;Bed/bath upstairs Home Equipment: Shower seat;Grab bars - tub/shower;Hand held shower head;Walker - 4 wheels;Walker - 2 wheels Additional Comments: has an aide 3 hours a day    Prior Function Level of Independence: Needs assistance   Gait / Transfers Assistance Needed: no device in the home, uses walker outside  ADL's / Homemaking Assistance Needed: sitter assists with shower        Hand Dominance   Dominant Hand: Left    Extremity/Trunk Assessment   Upper Extremity Assessment Upper Extremity Assessment: Defer to OT evaluation    Lower Extremity Assessment Lower Extremity Assessment: LLE deficits/detail;RLE deficits/detail RLE Deficits / Details: slower movements, tight hamstrings, strength WFL LLE Deficits / Details: slower movements, tight hamstrings, strength WFL       Communication   Communication: HOH  Cognition Arousal/Alertness: Awake/alert Behavior During Therapy: WFL for  tasks assessed/performed Overall Cognitive Status: No family/caregiver present to determine baseline cognitive functioning                                  General Comments: slow processing, STM deficit      General Comments General comments (skin integrity, edema, etc.): SpO2 92% on 2L O2 at rest, up on RA and SpO2 88%, with ambulation on RA 82%; with O2 applied ranged 84-90%    Exercises     Assessment/Plan    PT Assessment Patient needs continued PT services  PT Problem List Decreased strength;Decreased balance;Decreased range of motion;Decreased mobility;Decreased knowledge of use of DME;Decreased safety awareness       PT Treatment Interventions DME instruction;Gait training;Stair training;Balance training;Functional mobility training;Therapeutic exercise;Therapeutic activities;Patient/family education    PT Goals (Current goals can be found in the Care Plan section)  Acute Rehab PT Goals Patient Stated Goal: To go home PT Goal Formulation: With patient Time For Goal Achievement: 05/02/17 Potential to Achieve Goals: Good    Frequency Min 3X/week   Barriers to discharge        Co-evaluation               AM-PAC PT "6 Clicks" Daily Activity  Outcome Measure Difficulty turning over in bed (including adjusting bedclothes, sheets and blankets)?: A Little Difficulty moving from lying on back to sitting on the side of the bed? : Unable Difficulty sitting down on and standing up from a chair with arms (e.g., wheelchair, bedside commode, etc,.)?: Unable Help needed moving to and from a bed to chair (including a wheelchair)?: A Little Help needed walking in hospital room?: A Little Help needed climbing 3-5 steps with a railing? : A Little 6 Click Score: 14    End of Session Equipment Utilized During Treatment: Gait belt;Oxygen Activity Tolerance: Patient tolerated treatment well Patient left: in chair;with bed alarm set;with call bell/phone within reach   PT Visit Diagnosis: Other abnormalities of gait and mobility (R26.89);Repeated falls (R29.6)    Time: 2446-9507 PT Time Calculation (min) (ACUTE ONLY):  30 min   Charges:   PT Evaluation $PT Eval Moderate Complexity: 1 Mod PT Treatments $Gait Training: 8-22 mins   PT G Codes:   PT G-Codes **NOT FOR INPATIENT CLASS** Functional Assessment Tool Used: AM-PAC 6 Clicks Basic Mobility Functional Limitation: Mobility: Walking and moving around Mobility: Walking and Moving Around Current Status ([id number]): At least 40 percent but less than 60 percent impaired, limited or restricted Mobility: Walking and Moving Around Goal Status [phone]): At least 20 percent but less than 40 percent impaired, limited or restricted    [place], [place] 04/25/2017   Reginia Naas 04/25/2017, 12:39 PM

## 2017-04-25 NOTE — Progress Notes (Signed)
PROGRESS NOTE  [name]  HXT:[id number] DOB: 1946/03/06 DOA: 04/24/2017 PCP: Darlyne Russian, MD Outpatient Specialists:  Subjective: Patient seen just before PT was trying to get him up for therapy. Reportedly feeling weak and slow. Apparently he desaturated to 82% on room air while he was walking.  Brief Narrative:  [name] is a 71 y.o. male with medical history significant of stroke with residual deficit, history of PE in 2015, is still chronically anticoagulated with Eliquis. Patient came to the hospital because of generalized weakness. His wife was at bedside providing some of the history as she mentioned she has short-term memory difficulties. He fell 3 times in the past 2 weeks, today he couldn't get up out of the bed and she has to help him to sit up. He has some residual deficit after the stroke, but he was able to walk without help of the walker inside the house. He needs a walker when he gets out. He denies any fever, denies any chills, cough, burning while passing urine, denies any other symptoms or signs suggesting infection. Patient mentioned bilateral chest pain in the lower rib area, upon further questioning that. He fell on twice.  Assessment & Plan:   Active Problems:   Depression   Type 2 diabetes mellitus with circulatory disorder (HCC)   Essential hypertension   Cerebrovascular accident (CVA) due to embolism of right anterior cerebral artery (HCC)   General weakness   Generalized weakness and recurrent falls -Patient has history of cerebrovascular accident with some residual deficit, but does not need a walker inside the house. -Had 3 falls in the past 2 weeks and now couldn't get up. -No symptoms or signs of infection, clear chest x-ray and urinalysis. -Developed a low-grade fever of 99 today,, leukocytosis of 10.6 but no obvious source of infection. -Continue to hold antibiotics, keep for one more day for observation if and kidneys to be febrile will start  empiric antibiotics.  Chronic respiratory failure with hypoxia -Patient used to be on 3 L of oxygen all the time, per wife the [place] discontinued his oxygen concentrator. -Now he is on BiPAP at night with 3 L of oxygen but the daytime he walks without it. -This might be contributing to his generalized weakness and falls as he might be hypoxic with ambulation. -PT/OT recommended home PT, he also going to need oxygen as she does saturated to 82% with ambulation  Cerebrovascular accident -Per neurology records no history of Atrial fibrillation but patient is on Eliquis, continue.  Type 2 diabetes mellitus -Hemoglobin A1c is 7.5 indicating uncontrolled diabetes mellitus with hyperglycemia. -Continue SSI and holding oral hypoglycemic agents.   DVT prophylaxis: He is on milk was Code Status: Full Code Family Communication:  Disposition Plan:  Diet: Diet heart healthy/carb modified Room service appropriate? Yes; Fluid consistency: Thin  Consultants:   None  Procedures:   None  Antimicrobials:   None   Objective: Vitals:   04/24/17 1745 04/24/17 1915 04/24/17 2022 04/25/17 0506  BP: 137/72 125/66 134/62 130/62  Pulse: 80 75  93  Resp: 15 16  18   Temp:   98.5 F (36.9 C) 99 F (37.2 C)  TempSrc:   Oral Oral  SpO2:   96% 93%  Weight:   98.6 kg (217 lb 4.8 oz)   Height:   5' 6"  (1.676 m)     Intake/Output Summary (Last 24 hours) at 04/25/17 1422 Last data filed at 04/25/17 1000  Gross per 24 hour  Intake              480 ml  Output                0 ml  Net              480 ml   Filed Weights   04/24/17 2022  Weight: 98.6 kg (217 lb 4.8 oz)    Examination: General exam: Appears calm and comfortable  Respiratory system: Clear to auscultation. Respiratory effort normal. Cardiovascular system: S1 & S2 heard, RRR. No JVD, murmurs, rubs, gallops or clicks. No pedal edema. Gastrointestinal system: Abdomen is nondistended, soft and nontender. No organomegaly or masses  felt. Normal bowel sounds heard. Central nervous system: Alert and oriented. No focal neurological deficits. Extremities: Symmetric 5 x 5 power. Skin: No rashes, lesions or ulcers Psychiatry: Judgement and insight appear normal. Mood & affect appropriate.   Data Reviewed: I have personally reviewed following labs and imaging studies  CBC:  Recent Labs Lab 04/24/17 1409 04/25/17 0434  WBC 9.0 10.6*  NEUTROABS 6.5  --   HGB 11.6* 11.4*  HCT 36.8* 37.1*  MCV 97.1 98.1  PLT 279 992   Basic Metabolic Panel:  Recent Labs Lab 04/24/17 1409 04/25/17 0434  NA 139 139  K 3.9 4.5  CL 101 102  CO2 27 27  GLUCOSE 142* 145*  BUN 18 21*  CREATININE 0.91 0.98  CALCIUM 8.9 9.0   GFR: Estimated Creatinine Clearance: 73.8 mL/min (by C-G formula based on SCr of 0.98 mg/dL). Liver Function Tests:  Recent Labs Lab 04/24/17 1409  AST 38  ALT 29  ALKPHOS 115  BILITOT 0.5  PROT 7.2  ALBUMIN 3.5   No results for input(s): LIPASE, AMYLASE in the last 168 hours. No results for input(s): AMMONIA in the last 168 hours. Coagulation Profile:  Recent Labs Lab 04/24/17 2020  INR 1.22   Cardiac Enzymes: No results for input(s): CKTOTAL, CKMB, CKMBINDEX, TROPONINI in the last 168 hours. BNP (last 3 results) No results for input(s): PROBNP in the last 8760 hours. HbA1C:  Recent Labs  04/24/17 2020  HGBA1C 7.5*   CBG:  Recent Labs Lab 04/24/17 2115 04/25/17 0748 04/25/17 1225  GLUCAP 141* 138* 166*   Lipid Profile: No results for input(s): CHOL, HDL, LDLCALC, TRIG, CHOLHDL, LDLDIRECT in the last 72 hours. Thyroid Function Tests:  Recent Labs  04/24/17 2020  TSH 3.678   Anemia Panel: No results for input(s): VITAMINB12, FOLATE, FERRITIN, TIBC, IRON, RETICCTPCT in the last 72 hours. Urine analysis:    Component Value Date/Time   COLORURINE YELLOW 04/24/2017 [place] 04/24/2017 1526   LABSPEC 1.025 04/24/2017 1526   PHURINE 5.0 04/24/2017 1526     GLUCOSEU NEGATIVE 04/24/2017 1526   HGBUR SMALL (A) 04/24/2017 1526   BILIRUBINUR NEGATIVE 04/24/2017 1526   BILIRUBINUR neg 04/27/2014 1205   KETONESUR NEGATIVE 04/24/2017 1526   PROTEINUR 100 (A) 04/24/2017 1526   UROBILINOGEN 0.2 03/07/2015 1005   NITRITE NEGATIVE 04/24/2017 1526   LEUKOCYTESUR NEGATIVE 04/24/2017 1526   Sepsis Labs: @LABRCNTIP (procalcitonin:4,lacticidven:4)  )No results found for this or any previous visit (from the past 240 hour(s)).   Invalid input(s): PROCALCITONIN, LACTICACIDVEN   Radiology Studies: Dg Chest Portable 1 View  Result Date: 04/24/2017 CLINICAL DATA:  Chest and back pain.  Fall. EXAM: PORTABLE CHEST 1 VIEW COMPARISON:  Chest/rib radiographs 04/15/2017 FINDINGS: Sequelae of prior CABG are again identified. An implanted loop recorder is noted. The cardiac silhouette  remains mildly enlarged. No confluent airspace opacity, edema, sizable pleural effusion, or pneumothorax is identified. No acute osseous abnormality is seen. IMPRESSION: No active disease. Electronically Signed   By: Logan Bores M.D.   On: 04/24/2017 14:30   Ct Angio Chest/abd/pel For Dissection W And/or W/wo  Result Date: 04/24/2017 CLINICAL DATA:  Right flank pain following a fall last week. Back pain. Diaphoresis. Chest pain. Clinical concern for aortic dissection. EXAM: CT ANGIOGRAPHY CHEST, ABDOMEN AND PELVIS TECHNIQUE: Multidetector CT imaging through the chest, abdomen and pelvis was performed using the standard protocol during bolus administration of intravenous contrast. Multiplanar reconstructed images and MIPs were obtained and reviewed to evaluate the vascular anatomy. CONTRAST:  100 cc Isovue 370 COMPARISON:  Abdomen and pelvis CT dated 03/05/2017 and chest CTA dated 05/19/2014. FINDINGS: CTA CHEST FINDINGS Cardiovascular: Post CABG changes. Extensive atheromatous arterial calcifications, including the aorta and coronary arteries. Normal sized heart. No pericardial effusion. No  aortic aneurysm or dissection. Mediastinum/Nodes: No enlarged mediastinal, hilar, or axillary lymph nodes. Thyroid gland, trachea, and esophagus demonstrate no significant findings other than mild diffuse distal esophageal wall thickening. Lungs/Pleura: Minimal right basilar atelectasis or scarring. Bilateral calcified pleural plaques. Musculoskeletal: Thoracic spine degenerative changes. Review of the MIP images confirms the above findings. CTA ABDOMEN AND PELVIS FINDINGS VASCULAR Aorta: Atheromatous calcifications without aneurysm or dissection. Celiac: Patent without evidence of aneurysm, dissection, vasculitis or significant stenosis. SMA: Patent without evidence of aneurysm, dissection, vasculitis or significant stenosis. Renals: High-grade stenosis of the proximal right renal artery with approximately 85% narrowing. Proximal left renal artery atheromatous calcifications without significant luminal narrowing. IMA: Proximal calcifications without significant luminal narrowing. Inflow: Atheromatous calcifications without aneurysm or significant stenoses. Veins: No obvious venous abnormality within the limitations of this arterial phase study. Review of the MIP images confirms the above findings. NON-VASCULAR Hepatobiliary: No focal liver abnormality is seen. No gallstones, gallbladder wall thickening, or biliary dilatation. Pancreas: Unremarkable. No pancreatic ductal dilatation or surrounding inflammatory changes. Spleen: Normal in size without focal abnormality. Adrenals/Urinary Tract: Adrenal glands are unremarkable. Kidneys are normal, without renal calculi, focal lesion, or hydronephrosis. Bladder is unremarkable. Stomach/Bowel: Status post subtotal colectomy with a small bowel to sigmoid colon anastomosis. No interval abnormal bowel dilatation. Unremarkable stomach. Lymphatic: No significant vascular findings are present. No enlarged abdominal or pelvic lymph nodes. Reproductive: Small prostate gland.  Penile prosthesis with a right pelvic reservoir. Other: Midline surgical scar.  No hernia or free peritoneal fluid. Musculoskeletal: L5-S1 pedicle screw and rod fixation with interbody bone plug. Mild degenerative changes at the remaining levels of the lumbar spine. Review of the MIP images confirms the above findings. IMPRESSION: 1. No aortic dissection or acute abnormality. 2. Approximately 85% stenosis of the proximal right renal artery. 3. Calcified coronary artery and aortic atherosclerosis. 4. Mild diffuse distal esophageal wall thickening, most likely due to reflux esophagitis. Electronically Signed   By: Claudie Revering M.D.   On: 04/24/2017 17:07        Scheduled Meds: . apixaban  5 mg Oral BID  . ARIPiprazole  5 mg Oral Daily  . donepezil  10 mg Oral Daily  . FLUoxetine  30 mg Oral q morning - 10a  . furosemide  40 mg Intravenous Once  . insulin aspart  0-15 Units Subcutaneous TID WC  . lamoTRIgine  100 mg Oral BID  . mirtazapine  15 mg Oral QHS  . pantoprazole  40 mg Oral Daily  . traZODone  100 mg Oral QHS   Continuous  Infusions:   LOS: 0 days    Time spent: 35 minutes    Birdie Hopes, MD [facility] Pager [phone]  If 7PM-7AM, please contact night-coverage [email or web address] Password [email or web address] 04/25/2017, 2:22 PM

## 2017-04-25 NOTE — Care Management Note (Addendum)
Case Management Note  Patient Details  Name: [name] MRN: [id number] Date of Birth: 1946-06-28  Subjective/Objective:      Presents with generalized weakness, hx significant of stroke with residual deficit, history of PE in 2015. Resides with wife. Receives PSC provided by [facility], 6 days a week for 3 hrs each day.     Royale Swamy (Spouse) Elgar Scoggins (Daughter)    [phone]                     PCP:  [facility], Dr. Felton Clinton, fax # [phone] -[place], [phone] (pager)  [place]  [phone], ext [phone] , [facility]  Action/Plan: Plan is to d/c to home with home health services and oxygen.  Expected Discharge Date:                  Expected Discharge Plan:  [place]  In-House Referral:     Discharge planning Services  CM Consult  Post Acute Care Choice:  Resumption of Svcs/PTA Provider Choice offered to:  Patient, Spouse  DME Arranged:  Oxygen DME Agency:   [facility]  HH Arranged:  PT,OT,RN [place] Agency:   [facility]  Status of Service:  In process, will continue to follow  If discussed at Long Length of Stay Meetings, dates discussed:    Additional Comments:  Sharin Mons, RN 04/25/2017, 1:45 PM

## 2017-04-25 NOTE — Progress Notes (Signed)
SATURATION QUALIFICATIONS: (This note is used to comply with regulatory documentation for home oxygen)  Patient Saturations on Room Air at Rest = 88%  Patient Saturations on Room Air while Ambulating = 82%  Patient Saturations on 2 Liters of oxygen while Ambulating = 88%  Please briefly explain why patient needs home oxygen: Patient hypoxic on RA without obvious SOB, recent falls with weakness up walking.    [place], [place] 04/25/2017

## 2017-04-26 LAB — BASIC METABOLIC PANEL
ANION GAP: 9 (ref 5–15)
BUN: 21 mg/dL — AB (ref 6–20)
CALCIUM: 8.7 mg/dL — AB (ref 8.9–10.3)
CO2: 28 mmol/L (ref 22–32)
Chloride: 100 mmol/L — ABNORMAL LOW (ref 101–111)
Creatinine, Ser: 0.8 mg/dL (ref 0.61–1.24)
GFR calc Af Amer: >60 mL/min (ref >60)
GLUCOSE: 134 mg/dL — AB (ref 65–99)
Potassium: 3.5 mmol/L (ref 3.5–5.1)
Sodium: 137 mmol/L (ref 135–145)

## 2017-04-26 LAB — CBC
HCT: 35.1 % — ABNORMAL LOW (ref 39.0–52.0)
HEMOGLOBIN: 11 g/dL — AB (ref 13.0–17.0)
MCH: 30 pg (ref 26.0–34.0)
MCHC: 31.3 g/dL (ref 30.0–36.0)
MCV: 95.6 fL (ref 78.0–100.0)
Platelets: 284 10*3/uL (ref 150–400)
RBC: 3.67 MIL/uL — ABNORMAL LOW (ref 4.22–5.81)
RDW: 13.3 % (ref 11.5–15.5)
WBC: 9.6 10*3/uL (ref 4.0–10.5)

## 2017-04-26 LAB — GLUCOSE, CAPILLARY: GLUCOSE-CAPILLARY: 131 mg/dL — AB (ref 65–99)

## 2017-04-26 MED ORDER — OMEPRAZOLE 40 MG PO CPDR: 40.0000 mg | DELAYED_RELEASE_CAPSULE | Freq: Two times a day (BID) | ORAL | 0 refills | Status: DC

## 2017-04-26 MED ORDER — POTASSIUM CHLORIDE CRYS ER 20 MEQ PO TBCR
40.0000 meq | EXTENDED_RELEASE_TABLET | Freq: Once | ORAL | Status: AC
Start: 2017-04-26 — End: 2017-04-26
  Administered 2017-04-26: 40 meq via ORAL
  Filled 2017-04-26: qty 2

## 2017-04-26 NOTE — Progress Notes (Signed)
Physical Therapy Treatment Patient Details Name: [name] MRN: [id number] DOB: [birth year] Today's Date: 04/26/2017    History of Present Illness [name] is a 71 y.o. male with medical history significant of stroke (basal ganglia lacunar infarcts) with residual deficit, history of PE in 2015, is still chronically anticoagulated with Eliquis. Patient came to the hospital because of generalized weakness and three falls in 2 weeks.    PT Comments    Pt remains to require assistance to mobilize safely during hospitalization.  Wife can provide limited assistance but patient would benefit from increased assistance from [facility] services initially until he improves.  Plan is for d/c home today. Review stair negotiation for safe entry into home. Pt remains to fatigue easily.  DOE 2/4 SPO2 on 3L 93%-94%.  Pt require 4 rest periods during PT session.    Follow Up Recommendations  Home health PT;Supervision/Assistance - 24 hour [facility] program would be best to allow for more assistance intially.  )     Equipment Recommendations  None recommended by PT    Recommendations for Other Services       Precautions / Restrictions Precautions Precautions: Fall Restrictions Weight Bearing Restrictions: No    Mobility  Bed Mobility Overal bed mobility: Needs Assistance Bed Mobility: Rolling;Sidelying to Sit Rolling: Mod assist Sidelying to sit: Mod assist       General bed mobility comments: Pt required assist to roll to the L, cues for hooklying and pushing with R LE and reaching with RLE.  Pt then in sidelying unable to elevate trunk and required mod assist to elevate trunk into sitting.  Pt slightly off balance when sitting and required min assist to maintain balance until pt able to scoot forward and receive support from B LEs.     Transfers Overall transfer level: Needs assistance Equipment used: Rolling walker (2 wheeled) Transfers: Sit to/from Stand Sit to Stand: Min guard          General transfer comment: Cues for hand placement to and from seated surface.  Pt slow to ascend but did not require physical assistance.    Ambulation/Gait Ambulation/Gait assistance: Min assist Ambulation Distance (Feet):  (114f x2 trials 40 ft x2 trials.  Rest break between each trial. ) Assistive device: Rolling walker (2 wheeled) Gait Pattern/deviations: Step-through pattern;Shuffle;Decreased stride length;Wide base of support;Decreased step length - right   Gait velocity interpretation: Below normal speed for age/gender General Gait Details: Pt performed step through gait with cues to increased R step length to promote gait symmetry.  Pt SPO2 3L 93%-95%.     Stairs Stairs: Yes   Stair Management: One rail Right;Sideways;Forwards;Step to pattern Number of Stairs: 4 General stair comments: Pt required cues for sequencing and hand placement on rail.  Min assist for ascending stairs forward.  Pt performed sideways to descend the stairs.    Wheelchair Mobility    Modified Rankin (Stroke Patients Only)       Balance Overall balance assessment: Needs assistance   Sitting balance-Leahy Scale: Fair       Standing balance-Leahy Scale: Poor Standing balance comment: UE support for balance                            Cognition Arousal/Alertness: Awake/alert Behavior During Therapy: WFL for tasks assessed/performed Overall Cognitive Status: Within Functional Limits for tasks assessed  General Comments: slow processing, STM deficit      Exercises      General Comments        Pertinent Vitals/Pain Pain Assessment: 0-10 Pain Score: 5  Pain Location: L lower back Pain Descriptors / Indicators: Sharp Pain Intervention(s): Monitored during session;Repositioned    Home Living                      Prior Function            PT Goals (current goals can now be found in the care plan section) Acute  Rehab PT Goals Patient Stated Goal: To go home Potential to Achieve Goals: Good Progress towards PT goals: Progressing toward goals    Frequency    Min 3X/week      PT Plan Current plan remains appropriate    Co-evaluation              AM-PAC PT "6 Clicks" Daily Activity  Outcome Measure  Difficulty turning over in bed (including adjusting bedclothes, sheets and blankets)?: Unable Difficulty moving from lying on back to sitting on the side of the bed? : Unable Difficulty sitting down on and standing up from a chair with arms (e.g., wheelchair, bedside commode, etc,.)?: Unable Help needed moving to and from a bed to chair (including a wheelchair)?: A Little Help needed walking in hospital room?: A Little Help needed climbing 3-5 steps with a railing? : A Little 6 Click Score: 12    End of Session Equipment Utilized During Treatment: Gait belt;Oxygen Activity Tolerance: Patient tolerated treatment well Patient left: in chair;with bed alarm set;with call bell/phone within reach   PT Visit Diagnosis: Other abnormalities of gait and mobility (R26.89);Repeated falls (R29.6)     Time: 2376-2831 PT Time Calculation (min) (ACUTE ONLY): 33 min  Charges:  $Gait Training: 8-22 mins $Therapeutic Activity: 8-22 mins                    G Codes:       Governor Rooks, PTA pager [phone]    Cristela Blue 04/26/2017, 12:34 PM

## 2017-04-26 NOTE — Care Management Note (Signed)
Case Management Note  Patient Details  Name: [name] MRN: [id number] Date of Birth: 10/30/1945  Subjective/Objective:  Called RN to assess if pt has Oxygen at bedside in preparation for discharge and he does not. Made Jermaine with [facility] aware.                   Action/Plan:CM will sign off for now but will be available should additional discharge needs arise or disposition change.    Expected Discharge Date:  04/26/17               Expected Discharge Plan:  [place]  In-House Referral:     Discharge planning Services  CM Consult  Post Acute Care Choice:  Resumption of Svcs/PTA Provider Choice offered to:  Patient, Spouse  DME Arranged:  Oxygen DME Agency:  [place] Arranged:  PT, OT [facility] Agency:     Status of Service:  Completed, signed off  If discussed at [place] of Stay Meetings, dates discussed:    Additional Comments:  Delrae Sawyers, RN 04/26/2017, 10:49 AM

## 2017-04-26 NOTE — Progress Notes (Signed)
SATURATION QUALIFICATIONS: (This note is used to comply with regulatory documentation for home oxygen)  Patient Saturations on Room Air at Rest = 82%  Patient Saturations on Room Air while Ambulating = 82%  Patient Saturations on 3 Liters of oxygen while Ambulating = 93%  Please briefly explain why patient needs home oxygen:

## 2017-04-26 NOTE — Discharge Summary (Addendum)
Physician Discharge Summary  [name] ZSM:[id number] DOB: [birth year] DOA: 04/24/2017  PCP: Darlyne Russian, MD  Admit date: 04/24/2017 Discharge date: 04/26/2017  Admitted From: Home Disposition: Home  Recommendations for Outpatient Follow-up:  1. Follow up with PCP in 1-2 weeks 2. Please obtain BMP/CBC in one week  Home Health: PT/OT Equipment/Devices: DME O2  Discharge Condition: Stable CODE STATUS: Full Code Diet recommendation: Diet heart healthy/carb modified Room service appropriate? Yes; Fluid consistency: Thin Diet - low sodium heart healthy  Brief/Interim Summary: Jacob Ikard Whiteis a 71 y.o.malewith medical history significant of stroke with residual deficit, history of PE in 2015, is still chronically anticoagulated with Eliquis. Patient came to the hospital because of generalized weakness. His wife was at bedside providing some of the history as she mentioned she has short-term memory difficulties. He fell 3 times in the past 2 weeks, today he couldn't get up out of the bed and she has to help him to sit up. He has some residual deficit after the stroke, but he was able to walk without help of the walker inside the house. He needs a walker when he gets out. He denies any fever, denies any chills, cough, burning while passing urine, denies any other symptoms or signs suggesting infection. Patient mentioned bilateral chest pain in the lower rib area, upon further questioning that. He fell on twice.  Discharge Diagnoses:  Active Problems:   Depression   Type 2 diabetes mellitus with circulatory disorder (HCC)   Essential hypertension   Cerebrovascular accident (CVA) due to embolism of right anterior cerebral artery (HCC)   General weakness   Generalized weakness   Generalized weakness and recurrent falls -Patient has history of cerebrovascular accident with some residual deficit, but does not need a walker inside the house. -Had 3 falls in the past 2 weeks and now  couldn't get up. -No symptoms or signs of infection, clear chest x-ray and urinalysis. -Seen by PT and recommended home PT, OT and DME oxygen with ambulation and while being stationary.  Low grade Fever -Developed a low-grade fever of 99, leukocytosis of 10.6 but no obvious source of infection. -Continue to hold antibiotics, kept overnight, no more fevers no antibiotics on discharge.  Chronic respiratory failure with hypoxia -Patient used to be on 3 L of oxygen all the time, per wife the [place] discontinued his oxygen concentrator. -Now he is on BiPAP at night with 3 L of oxygen but the daytime he walks without it. -This might be contributing to his generalized weakness and falls as he might be hypoxic with ambulation. -PT/OT recommended home PT/OT, he also going to need oxygen as she does saturated to 82% with ambulation. -He will need at least 3 L of oxygen continuously  Cerebrovascular accident -Per neurology records no history of Atrial fibrillation but patient is on Eliquis, continue.  Type 2 diabetes mellitus -Hemoglobin A1c is 7.5 indicating uncontrolled diabetes mellitus with hyperglycemia. -Continue SSI and holding oral hypoglycemic agents.  GERD -Thickening of the lower esophagus, patient is on omeprazole 20 mg, increase to 40 mg twice a day for a month. -After that patient should be on 40 mg daily.   Discharge Instructions  Discharge Instructions    Diet - low sodium heart healthy    Complete by:  As directed    Increase activity slowly    Complete by:  As directed      Allergies as of 04/26/2017      Reactions   Cephalexin  Unknown   Doxycycline    Unknown   Keflex [cephalexin]    Lac Bovis Other (See Comments)   lactose intolerant   Pentazocine Lactate    He passed out- he had a seizure.  This occurred around 2000   Talwin [pentazocine] Other (See Comments)   hallucinations   Tape Other (See Comments)   Paper tape only please.   Zanaflex [tizanidine Hcl]  Other (See Comments)   Lightheaded and dizzy      Medication List    STOP taking these medications   oxyCODONE-acetaminophen 5-325 MG tablet Commonly known as:  PERCOCET     TAKE these medications   acetaminophen 500 MG tablet Commonly known as:  TYLENOL Take 500 mg by mouth every 6 (six) hours as needed.   apixaban 5 MG Tabs tablet Commonly known as:  ELIQUIS TAKE 1 TABLET(5 MG) BY MOUTH TWICE DAILY   ARIPiprazole 5 MG tablet Commonly known as:  ABILIFY Take 5 mg by mouth daily.   aspirin EC 81 MG tablet Take 1 tablet (81 mg total) by mouth daily.   atorvastatin 80 MG tablet Commonly known as:  LIPITOR Take 1 tablet (80 mg total) by mouth daily. What changed:  when to take this   carbamide peroxide 6.5 % OTIC solution Commonly known as:  DEBROX Place 5 drops into both ears 2 (two) times daily.   donepezil 10 MG disintegrating tablet Commonly known as:  ARICEPT ODT Take 10 mg by mouth daily.   FLUoxetine 10 MG capsule Commonly known as:  PROZAC Take 30 mg by mouth every morning.   fluticasone 50 MCG/ACT nasal spray Commonly known as:  FLONASE Place 2 sprays into both nostrils daily.   furosemide 20 MG tablet Commonly known as:  LASIX TAKE 1-2 TABLETS BY MOUTH EVERY DAY AS NEEDED What changed:  See the new instructions.   GABAPENTIN PO Take 1 tablet by mouth at bedtime. For itching   gemfibrozil 600 MG tablet Commonly known as:  LOPID Take 600 mg by mouth.   glipiZIDE 5 MG tablet Commonly known as:  GLUCOTROL Take 2.5 mg by mouth 2 (two) times daily before a meal.   HYDROcodone-acetaminophen 5-325 MG tablet Commonly known as:  NORCO Take 1-2 tablets by mouth every 6 (six) hours as needed.   hydroxypropyl methylcellulose / hypromellose 2.5 % ophthalmic solution Commonly known as:  ISOPTO TEARS / GONIOVISC Place 1 drop into both eyes as needed for dry eyes.   lamoTRIgine 100 MG tablet Commonly known as:  LAMICTAL Take 1 tablet (100 mg total) by  mouth 2 (two) times daily.   loperamide 2 MG capsule Commonly known as:  IMODIUM Take 2 mg by mouth as needed for diarrhea or loose stools.   meclizine 12.5 MG tablet Commonly known as:  ANTIVERT Take 1 tablet (12.5 mg total) by mouth 2 (two) times daily as needed for dizziness.   mirtazapine 15 MG disintegrating tablet Commonly known as:  REMERON SOL-TAB Take 15 mg by mouth.   multivitamin capsule Take 1 capsule by mouth daily.   nitroGLYCERIN 0.4 MG SL tablet Commonly known as:  NITROSTAT Place 1 tablet (0.4 mg total) under the tongue every 5 (five) minutes as needed. For chest pain   omeprazole 40 MG capsule Commonly known as:  PRILOSEC Take 1 capsule (40 mg total) by mouth 2 (two) times daily before a meal. What changed:  medication strength  how much to take  when to take this   [place]  TEST test strip Generic drug:  glucose blood USE TO TEST BLOOD SUGAR DAILY   potassium citrate 10 MEQ (1080 MG) SR tablet Commonly known as:  UROCIT-K Take 10 mEq by mouth 3 (three) times daily with meals.   sitaGLIPtin 100 MG tablet Commonly known as:  JANUVIA Take 100 mg by mouth every morning. Reported on 09/19/2015   traZODone 100 MG tablet Commonly known as:  DESYREL Take 100 mg by mouth at bedtime.   triamcinolone 0.1 % cream : eucerin Crea Apply 1 application topically daily. TRIAMCINOLONE 1%/ Absorbase            Durable Medical Equipment        Start     Ordered   04/25/17 1445  For home use only DME oxygen  Once    Question Answer Comment  Mode or (Route) Nasal cannula   Liters per Minute 3   Frequency Continuous (stationary and portable oxygen unit needed)   Oxygen delivery system Gas      04/25/17 1445       Discharge Care Instructions        Start     Ordered   04/26/17 0000  Increase activity slowly     04/26/17 1048   04/26/17 0000  Diet - low sodium heart healthy     04/26/17 1048   04/26/17 0000  omeprazole (PRILOSEC) 40 MG  capsule  2 times daily before meals     04/26/17 1214     Follow-up Information    [facility] Follow up.   Specialty:  [place] Why:  home health services arranged, office will call to set up home visits Contact information: [address] [place] [address] [phone]        [facility] - Dme Follow up.   Why:  home oxygen arranged Contact information: [place] [address] [phone]          Allergies  Allergen Reactions  . Cephalexin     Unknown  . Doxycycline     Unknown  . Keflex [Cephalexin]   . Lac Bovis Other (See Comments)    lactose intolerant  . Pentazocine Lactate     He passed out- he had a seizure.  This occurred around 2000  . Talwin [Pentazocine] Other (See Comments)    hallucinations  . Tape Other (See Comments)    Paper tape only please.  . Zanaflex [Tizanidine Hcl] Other (See Comments)    Lightheaded and dizzy    Consultations:  None  Procedures (Echo, Carotid, EGD, Colonoscopy, ERCP)   Radiological studies: Dg Ribs Bilateral W/chest  Result Date: 04/15/2017 CLINICAL DATA:  Fall.  Rib pain. EXAM: BILATERAL RIBS AND CHEST - 4+ VIEW COMPARISON:  12/08/2014. FINDINGS: Prior CABG. Cardiac monitoring device noted. Cardiomegaly with normal pulmonary vascularity. Mild left base pleuroparenchymal thickening consistent scarring. Calcified pleural plaques again noted. Left posterolateral second rib nondisplaced fracture, age undetermined, noted. No displaced rib fractures noted. No pneumothorax. IMPRESSION: Left posterolateral second rib nondisplaced fracture, age undetermined, noted. No displaced rib fracture noted. No pneumothorax. Electronically Signed   By: Marcello Moores  Register   On: 04/15/2017 15:27   Ct Head Wo Contrast  Result Date: 04/15/2017 CLINICAL DATA:  [place] out of bed last night. Blunt head trauma. Posttraumatic headache. Initial encounter. EXAM: CT HEAD WITHOUT  CONTRAST TECHNIQUE: Contiguous axial images were obtained from the base of the skull through the vertex without intravenous contrast.  COMPARISON:  03/07/2015 FINDINGS: Brain: No evidence of acute infarction, hemorrhage, hydrocephalus, extra-axial collection, or mass lesion/mass effect. Old right frontal ACA distribution infarct is stable in appearance. Stable old small left occipital lobe infarct. Stable appearance of chronic small vessel disease and old bilateral basal ganglia lacunar infarcts. Vascular:  No hyperdense vessel or other acute findings. Skull: No evidence of fracture or other significant bone abnormality. Sinuses/Orbits:  No acute findings. Other: Persistent cavum septum pellucidum and vergae incidentally noted. IMPRESSION: No acute intracranial abnormality. Stable old right frontal and left occipital lobe infarcts. Stable chronic small vessel disease. Electronically Signed   By: Earle Gell M.D.   On: 04/15/2017 14:57   Dg Chest Portable 1 View  Result Date: 04/24/2017 CLINICAL DATA:  Chest and back pain.  Fall. EXAM: PORTABLE CHEST 1 VIEW COMPARISON:  Chest/rib radiographs 04/15/2017 FINDINGS: Sequelae of prior CABG are again identified. An implanted loop recorder is noted. The cardiac silhouette remains mildly enlarged. No confluent airspace opacity, edema, sizable pleural effusion, or pneumothorax is identified. No acute osseous abnormality is seen. IMPRESSION: No active disease. Electronically Signed   By: Logan Bores M.D.   On: 04/24/2017 14:30   Ct Angio Chest/abd/pel For Dissection W And/or W/wo  Result Date: 04/24/2017 CLINICAL DATA:  Right flank pain following a fall last week. Back pain. Diaphoresis. Chest pain. Clinical concern for aortic dissection. EXAM: CT ANGIOGRAPHY CHEST, ABDOMEN AND PELVIS TECHNIQUE: Multidetector CT imaging through the chest, abdomen and pelvis was performed using the standard protocol during bolus administration of intravenous contrast. Multiplanar  reconstructed images and MIPs were obtained and reviewed to evaluate the vascular anatomy. CONTRAST:  100 cc Isovue 370 COMPARISON:  Abdomen and pelvis CT dated 03/05/2017 and chest CTA dated 05/19/2014. FINDINGS: CTA CHEST FINDINGS Cardiovascular: Post CABG changes. Extensive atheromatous arterial calcifications, including the aorta and coronary arteries. Normal sized heart. No pericardial effusion. No aortic aneurysm or dissection. Mediastinum/Nodes: No enlarged mediastinal, hilar, or axillary lymph nodes. Thyroid gland, trachea, and esophagus demonstrate no significant findings other than mild diffuse distal esophageal wall thickening. Lungs/Pleura: Minimal right basilar atelectasis or scarring. Bilateral calcified pleural plaques. Musculoskeletal: Thoracic spine degenerative changes. Review of the MIP images confirms the above findings. CTA ABDOMEN AND PELVIS FINDINGS VASCULAR Aorta: Atheromatous calcifications without aneurysm or dissection. Celiac: Patent without evidence of aneurysm, dissection, vasculitis or significant stenosis. SMA: Patent without evidence of aneurysm, dissection, vasculitis or significant stenosis. Renals: High-grade stenosis of the proximal right renal artery with approximately 85% narrowing. Proximal left renal artery atheromatous calcifications without significant luminal narrowing. IMA: Proximal calcifications without significant luminal narrowing. Inflow: Atheromatous calcifications without aneurysm or significant stenoses. Veins: No obvious venous abnormality within the limitations of this arterial phase study. Review of the MIP images confirms the above findings. NON-VASCULAR Hepatobiliary: No focal liver abnormality is seen. No gallstones, gallbladder wall thickening, or biliary dilatation. Pancreas: Unremarkable. No pancreatic ductal dilatation or surrounding inflammatory changes. Spleen: Normal in size without focal abnormality. Adrenals/Urinary Tract: Adrenal glands are  unremarkable. Kidneys are normal, without renal calculi, focal lesion, or hydronephrosis. Bladder is unremarkable. Stomach/Bowel: Status post subtotal colectomy with a small bowel to sigmoid colon anastomosis. No interval abnormal bowel dilatation. Unremarkable stomach. Lymphatic: No significant vascular findings are present. No enlarged abdominal or pelvic lymph nodes. Reproductive: Small prostate gland. Penile prosthesis with a right pelvic reservoir. Other: Midline surgical scar.  No hernia or free peritoneal fluid. Musculoskeletal: L5-S1 pedicle screw and rod fixation with interbody bone plug. Mild degenerative changes at the remaining  levels of the lumbar spine. Review of the MIP images confirms the above findings. IMPRESSION: 1. No aortic dissection or acute abnormality. 2. Approximately 85% stenosis of the proximal right renal artery. 3. Calcified coronary artery and aortic atherosclerosis. 4. Mild diffuse distal esophageal wall thickening, most likely due to reflux esophagitis. Electronically Signed   By: Claudie Revering M.D.   On: 04/24/2017 17:07    Subjective:  Discharge Exam: Vitals:   04/25/17 0506 04/25/17 1455 04/25/17 2134 04/26/17 0551  BP: 130/62 (!) 137/59 (!) 142/63 136/61  Pulse: 93 84 79 86  Resp: 18 18 18 18   Temp: 99 F (37.2 C) 99.3 F (37.4 C) 98 F (36.7 C) 98.4 F (36.9 C)  TempSrc: Oral Oral Oral Oral  SpO2: 93% 96% 94% 91%  Weight:      Height:       General: Pt is alert, awake, not in acute distress Cardiovascular: RRR, S1/S2 +, no rubs, no gallops Respiratory: CTA bilaterally, no wheezing, no rhonchi Abdominal: Soft, NT, ND, bowel sounds + Extremities: no edema, no cyanosis   The results of significant diagnostics from this hospitalization (including imaging, microbiology, ancillary and laboratory) are listed below for reference.    Microbiology: No results found for this or any previous visit (from the past 240 hour(s)).   Labs: BNP (last 3 results) No  results for input(s): BNP in the last 8760 hours. Basic Metabolic Panel:  Recent Labs Lab 04/24/17 1409 04/25/17 0434 04/26/17 0554  NA 139 139 137  K 3.9 4.5 3.5  CL 101 102 100*  CO2 27 27 28   GLUCOSE 142* 145* 134*  BUN 18 21* 21*  CREATININE 0.91 0.98 0.80  CALCIUM 8.9 9.0 8.7*   Liver Function Tests:  Recent Labs Lab 04/24/17 1409  AST 38  ALT 29  ALKPHOS 115  BILITOT 0.5  PROT 7.2  ALBUMIN 3.5   No results for input(s): LIPASE, AMYLASE in the last 168 hours. No results for input(s): AMMONIA in the last 168 hours. CBC:  Recent Labs Lab 04/24/17 1409 04/25/17 0434 04/26/17 0554  WBC 9.0 10.6* 9.6  NEUTROABS 6.5  --   --   HGB 11.6* 11.4* 11.0*  HCT 36.8* 37.1* 35.1*  MCV 97.1 98.1 95.6  PLT 279 296 284   Cardiac Enzymes: No results for input(s): CKTOTAL, CKMB, CKMBINDEX, TROPONINI in the last 168 hours. BNP: Invalid input(s): POCBNP CBG:  Recent Labs Lab 04/24/17 2115 04/25/17 0748 04/25/17 1225 04/25/17 1731 04/25/17 2139  GLUCAP 141* 138* 166* 154* 195*   D-Dimer No results for input(s): DDIMER in the last 72 hours. Hgb A1c  Recent Labs  04/24/17 2020  HGBA1C 7.5*   Lipid Profile No results for input(s): CHOL, HDL, LDLCALC, TRIG, CHOLHDL, LDLDIRECT in the last 72 hours. Thyroid function studies  Recent Labs  04/24/17 2020  TSH 3.678   Anemia work up No results for input(s): VITAMINB12, FOLATE, FERRITIN, TIBC, IRON, RETICCTPCT in the last 72 hours. Urinalysis    Component Value Date/Time   COLORURINE YELLOW 04/24/2017 1526   APPEARANCEUR CLEAR 04/24/2017 1526   LABSPEC 1.025 04/24/2017 1526   PHURINE 5.0 04/24/2017 1526   GLUCOSEU NEGATIVE 04/24/2017 1526   HGBUR SMALL (A) 04/24/2017 1526   BILIRUBINUR NEGATIVE 04/24/2017 1526   BILIRUBINUR neg 04/27/2014 1205   KETONESUR NEGATIVE 04/24/2017 1526   PROTEINUR 100 (A) 04/24/2017 1526   UROBILINOGEN 0.2 03/07/2015 1005   NITRITE NEGATIVE 04/24/2017 1526   LEUKOCYTESUR  NEGATIVE 04/24/2017 1526   Sepsis  Labs Invalid input(s): PROCALCITONIN,  WBC,  LACTICIDVEN Microbiology No results found for this or any previous visit (from the past 240 hour(s)).   Time coordinating discharge: Over 30 minutes  SIGNED:   Birdie Hopes, MD  [facility] 04/26/2017, 12:15 PM Pager   If 7PM-7AM, please contact night-coverage [email or web address] Password [email or web address]

## 2017-04-27 DIAGNOSIS — I69398 Other sequelae of cerebral infarction: Secondary | ICD-10-CM | POA: Diagnosis not present

## 2017-04-27 DIAGNOSIS — J9610 Chronic respiratory failure, unspecified whether with hypoxia or hypercapnia: Secondary | ICD-10-CM | POA: Diagnosis not present

## 2017-04-27 DIAGNOSIS — M6281 Muscle weakness (generalized): Secondary | ICD-10-CM | POA: Diagnosis not present

## 2017-04-27 DIAGNOSIS — Z9181 History of falling: Secondary | ICD-10-CM | POA: Diagnosis not present

## 2017-04-27 DIAGNOSIS — F0390 Unspecified dementia without behavioral disturbance: Secondary | ICD-10-CM | POA: Diagnosis not present

## 2017-04-27 DIAGNOSIS — I10 Essential (primary) hypertension: Secondary | ICD-10-CM | POA: Diagnosis not present

## 2017-04-27 DIAGNOSIS — I2510 Atherosclerotic heart disease of native coronary artery without angina pectoris: Secondary | ICD-10-CM | POA: Diagnosis not present

## 2017-04-27 DIAGNOSIS — H9190 Unspecified hearing loss, unspecified ear: Secondary | ICD-10-CM | POA: Diagnosis not present

## 2017-04-27 DIAGNOSIS — M4696 Unspecified inflammatory spondylopathy, lumbar region: Secondary | ICD-10-CM | POA: Diagnosis not present

## 2017-04-27 DIAGNOSIS — E1159 Type 2 diabetes mellitus with other circulatory complications: Secondary | ICD-10-CM | POA: Diagnosis not present

## 2017-04-27 DIAGNOSIS — E669 Obesity, unspecified: Secondary | ICD-10-CM | POA: Diagnosis not present

## 2017-04-27 DIAGNOSIS — G40909 Epilepsy, unspecified, not intractable, without status epilepticus: Secondary | ICD-10-CM | POA: Diagnosis not present

## 2017-04-27 DIAGNOSIS — Z7984 Long term (current) use of oral hypoglycemic drugs: Secondary | ICD-10-CM | POA: Diagnosis not present

## 2017-04-27 DIAGNOSIS — Z9981 Dependence on supplemental oxygen: Secondary | ICD-10-CM | POA: Diagnosis not present

## 2017-04-27 DIAGNOSIS — R1314 Dysphagia, pharyngoesophageal phase: Secondary | ICD-10-CM | POA: Diagnosis not present

## 2017-04-27 DIAGNOSIS — J449 Chronic obstructive pulmonary disease, unspecified: Secondary | ICD-10-CM | POA: Diagnosis not present

## 2017-04-27 DIAGNOSIS — I69322 Dysarthria following cerebral infarction: Secondary | ICD-10-CM | POA: Diagnosis not present

## 2017-04-27 DIAGNOSIS — R32 Unspecified urinary incontinence: Secondary | ICD-10-CM | POA: Diagnosis not present

## 2017-04-27 DIAGNOSIS — I69391 Dysphagia following cerebral infarction: Secondary | ICD-10-CM | POA: Diagnosis not present

## 2017-04-27 DIAGNOSIS — I4892 Unspecified atrial flutter: Secondary | ICD-10-CM | POA: Diagnosis not present

## 2017-04-27 DIAGNOSIS — Z7902 Long term (current) use of antithrombotics/antiplatelets: Secondary | ICD-10-CM | POA: Diagnosis not present

## 2017-04-27 DIAGNOSIS — F329 Major depressive disorder, single episode, unspecified: Secondary | ICD-10-CM | POA: Diagnosis not present

## 2017-04-27 DIAGNOSIS — G4733 Obstructive sleep apnea (adult) (pediatric): Secondary | ICD-10-CM | POA: Diagnosis not present

## 2017-04-27 DIAGNOSIS — I4891 Unspecified atrial fibrillation: Secondary | ICD-10-CM | POA: Diagnosis not present

## 2017-04-29 ENCOUNTER — Ambulatory Visit: Payer: Medicare Other | Admitting: Neurology

## 2017-04-29 LAB — GLUCOSE, CAPILLARY: Glucose-Capillary: 133 mg/dL — ABNORMAL HIGH (ref 65–99)

## 2017-04-30 DIAGNOSIS — I69391 Dysphagia following cerebral infarction: Secondary | ICD-10-CM | POA: Diagnosis not present

## 2017-04-30 DIAGNOSIS — R1314 Dysphagia, pharyngoesophageal phase: Secondary | ICD-10-CM | POA: Diagnosis not present

## 2017-04-30 DIAGNOSIS — I69322 Dysarthria following cerebral infarction: Secondary | ICD-10-CM | POA: Diagnosis not present

## 2017-04-30 DIAGNOSIS — E1159 Type 2 diabetes mellitus with other circulatory complications: Secondary | ICD-10-CM | POA: Diagnosis not present

## 2017-04-30 DIAGNOSIS — I69398 Other sequelae of cerebral infarction: Secondary | ICD-10-CM | POA: Diagnosis not present

## 2017-04-30 DIAGNOSIS — M6281 Muscle weakness (generalized): Secondary | ICD-10-CM | POA: Diagnosis not present

## 2017-06-01 DIAGNOSIS — I69391 Dysphagia following cerebral infarction: Secondary | ICD-10-CM | POA: Diagnosis not present

## 2017-06-01 DIAGNOSIS — I69398 Other sequelae of cerebral infarction: Secondary | ICD-10-CM | POA: Diagnosis not present

## 2017-06-01 DIAGNOSIS — R1314 Dysphagia, pharyngoesophageal phase: Secondary | ICD-10-CM | POA: Diagnosis not present

## 2017-06-01 DIAGNOSIS — M6281 Muscle weakness (generalized): Secondary | ICD-10-CM | POA: Diagnosis not present

## 2017-06-01 DIAGNOSIS — E1159 Type 2 diabetes mellitus with other circulatory complications: Secondary | ICD-10-CM | POA: Diagnosis not present

## 2017-06-01 DIAGNOSIS — I69322 Dysarthria following cerebral infarction: Secondary | ICD-10-CM | POA: Diagnosis not present

## 2017-06-02 DIAGNOSIS — R1314 Dysphagia, pharyngoesophageal phase: Secondary | ICD-10-CM | POA: Diagnosis not present

## 2017-06-02 DIAGNOSIS — I69398 Other sequelae of cerebral infarction: Secondary | ICD-10-CM | POA: Diagnosis not present

## 2017-06-02 DIAGNOSIS — I69391 Dysphagia following cerebral infarction: Secondary | ICD-10-CM | POA: Diagnosis not present

## 2017-06-02 DIAGNOSIS — E1159 Type 2 diabetes mellitus with other circulatory complications: Secondary | ICD-10-CM | POA: Diagnosis not present

## 2017-06-02 DIAGNOSIS — M6281 Muscle weakness (generalized): Secondary | ICD-10-CM | POA: Diagnosis not present

## 2017-06-02 DIAGNOSIS — I69322 Dysarthria following cerebral infarction: Secondary | ICD-10-CM | POA: Diagnosis not present

## 2017-06-11 DIAGNOSIS — I69322 Dysarthria following cerebral infarction: Secondary | ICD-10-CM | POA: Diagnosis not present

## 2017-06-11 DIAGNOSIS — M6281 Muscle weakness (generalized): Secondary | ICD-10-CM | POA: Diagnosis not present

## 2017-06-11 DIAGNOSIS — R1314 Dysphagia, pharyngoesophageal phase: Secondary | ICD-10-CM | POA: Diagnosis not present

## 2017-06-11 DIAGNOSIS — I69391 Dysphagia following cerebral infarction: Secondary | ICD-10-CM | POA: Diagnosis not present

## 2017-06-11 DIAGNOSIS — E1159 Type 2 diabetes mellitus with other circulatory complications: Secondary | ICD-10-CM | POA: Diagnosis not present

## 2017-06-11 DIAGNOSIS — I69398 Other sequelae of cerebral infarction: Secondary | ICD-10-CM | POA: Diagnosis not present

## 2017-06-13 DIAGNOSIS — I69398 Other sequelae of cerebral infarction: Secondary | ICD-10-CM | POA: Diagnosis not present

## 2017-06-13 DIAGNOSIS — R1314 Dysphagia, pharyngoesophageal phase: Secondary | ICD-10-CM | POA: Diagnosis not present

## 2017-06-13 DIAGNOSIS — I69322 Dysarthria following cerebral infarction: Secondary | ICD-10-CM | POA: Diagnosis not present

## 2017-06-13 DIAGNOSIS — E1159 Type 2 diabetes mellitus with other circulatory complications: Secondary | ICD-10-CM | POA: Diagnosis not present

## 2017-06-13 DIAGNOSIS — M6281 Muscle weakness (generalized): Secondary | ICD-10-CM | POA: Diagnosis not present

## 2017-06-13 DIAGNOSIS — I69391 Dysphagia following cerebral infarction: Secondary | ICD-10-CM | POA: Diagnosis not present

## 2017-06-16 DIAGNOSIS — I69391 Dysphagia following cerebral infarction: Secondary | ICD-10-CM | POA: Diagnosis not present

## 2017-06-16 DIAGNOSIS — R1314 Dysphagia, pharyngoesophageal phase: Secondary | ICD-10-CM | POA: Diagnosis not present

## 2017-06-16 DIAGNOSIS — I69322 Dysarthria following cerebral infarction: Secondary | ICD-10-CM | POA: Diagnosis not present

## 2017-06-16 DIAGNOSIS — I69398 Other sequelae of cerebral infarction: Secondary | ICD-10-CM | POA: Diagnosis not present

## 2017-06-16 DIAGNOSIS — E1159 Type 2 diabetes mellitus with other circulatory complications: Secondary | ICD-10-CM | POA: Diagnosis not present

## 2017-06-16 DIAGNOSIS — M6281 Muscle weakness (generalized): Secondary | ICD-10-CM | POA: Diagnosis not present

## 2017-06-20 DIAGNOSIS — I69398 Other sequelae of cerebral infarction: Secondary | ICD-10-CM | POA: Diagnosis not present

## 2017-06-20 DIAGNOSIS — I69322 Dysarthria following cerebral infarction: Secondary | ICD-10-CM | POA: Diagnosis not present

## 2017-06-20 DIAGNOSIS — R1314 Dysphagia, pharyngoesophageal phase: Secondary | ICD-10-CM | POA: Diagnosis not present

## 2017-06-20 DIAGNOSIS — E1159 Type 2 diabetes mellitus with other circulatory complications: Secondary | ICD-10-CM | POA: Diagnosis not present

## 2017-06-20 DIAGNOSIS — I69391 Dysphagia following cerebral infarction: Secondary | ICD-10-CM | POA: Diagnosis not present

## 2017-06-20 DIAGNOSIS — M6281 Muscle weakness (generalized): Secondary | ICD-10-CM | POA: Diagnosis not present

## 2017-06-23 DIAGNOSIS — I69398 Other sequelae of cerebral infarction: Secondary | ICD-10-CM | POA: Diagnosis not present

## 2017-06-23 DIAGNOSIS — R1314 Dysphagia, pharyngoesophageal phase: Secondary | ICD-10-CM | POA: Diagnosis not present

## 2017-06-23 DIAGNOSIS — I69322 Dysarthria following cerebral infarction: Secondary | ICD-10-CM | POA: Diagnosis not present

## 2017-06-23 DIAGNOSIS — M6281 Muscle weakness (generalized): Secondary | ICD-10-CM | POA: Diagnosis not present

## 2017-06-23 DIAGNOSIS — E1159 Type 2 diabetes mellitus with other circulatory complications: Secondary | ICD-10-CM | POA: Diagnosis not present

## 2017-06-23 DIAGNOSIS — I69391 Dysphagia following cerebral infarction: Secondary | ICD-10-CM | POA: Diagnosis not present

## 2017-06-25 DIAGNOSIS — E1159 Type 2 diabetes mellitus with other circulatory complications: Secondary | ICD-10-CM | POA: Diagnosis not present

## 2017-06-25 DIAGNOSIS — M6281 Muscle weakness (generalized): Secondary | ICD-10-CM | POA: Diagnosis not present

## 2017-06-25 DIAGNOSIS — I69322 Dysarthria following cerebral infarction: Secondary | ICD-10-CM | POA: Diagnosis not present

## 2017-06-25 DIAGNOSIS — I69398 Other sequelae of cerebral infarction: Secondary | ICD-10-CM | POA: Diagnosis not present

## 2017-06-25 DIAGNOSIS — I69391 Dysphagia following cerebral infarction: Secondary | ICD-10-CM | POA: Diagnosis not present

## 2017-06-25 DIAGNOSIS — R1314 Dysphagia, pharyngoesophageal phase: Secondary | ICD-10-CM | POA: Diagnosis not present

## 2017-06-26 DIAGNOSIS — Z7984 Long term (current) use of oral hypoglycemic drugs: Secondary | ICD-10-CM | POA: Diagnosis not present

## 2017-06-26 DIAGNOSIS — I69398 Other sequelae of cerebral infarction: Secondary | ICD-10-CM | POA: Diagnosis not present

## 2017-06-26 DIAGNOSIS — I10 Essential (primary) hypertension: Secondary | ICD-10-CM | POA: Diagnosis not present

## 2017-06-26 DIAGNOSIS — E1159 Type 2 diabetes mellitus with other circulatory complications: Secondary | ICD-10-CM | POA: Diagnosis not present

## 2017-06-26 DIAGNOSIS — R32 Unspecified urinary incontinence: Secondary | ICD-10-CM | POA: Diagnosis not present

## 2017-06-26 DIAGNOSIS — M4696 Unspecified inflammatory spondylopathy, lumbar region: Secondary | ICD-10-CM | POA: Diagnosis not present

## 2017-06-26 DIAGNOSIS — G40909 Epilepsy, unspecified, not intractable, without status epilepticus: Secondary | ICD-10-CM | POA: Diagnosis not present

## 2017-06-26 DIAGNOSIS — R1314 Dysphagia, pharyngoesophageal phase: Secondary | ICD-10-CM | POA: Diagnosis not present

## 2017-06-26 DIAGNOSIS — I2510 Atherosclerotic heart disease of native coronary artery without angina pectoris: Secondary | ICD-10-CM | POA: Diagnosis not present

## 2017-06-26 DIAGNOSIS — I69322 Dysarthria following cerebral infarction: Secondary | ICD-10-CM | POA: Diagnosis not present

## 2017-06-26 DIAGNOSIS — F329 Major depressive disorder, single episode, unspecified: Secondary | ICD-10-CM | POA: Diagnosis not present

## 2017-06-26 DIAGNOSIS — G4733 Obstructive sleep apnea (adult) (pediatric): Secondary | ICD-10-CM | POA: Diagnosis not present

## 2017-06-26 DIAGNOSIS — Z7902 Long term (current) use of antithrombotics/antiplatelets: Secondary | ICD-10-CM | POA: Diagnosis not present

## 2017-06-26 DIAGNOSIS — J9610 Chronic respiratory failure, unspecified whether with hypoxia or hypercapnia: Secondary | ICD-10-CM | POA: Diagnosis not present

## 2017-06-26 DIAGNOSIS — H9190 Unspecified hearing loss, unspecified ear: Secondary | ICD-10-CM | POA: Diagnosis not present

## 2017-06-26 DIAGNOSIS — M6281 Muscle weakness (generalized): Secondary | ICD-10-CM | POA: Diagnosis not present

## 2017-06-26 DIAGNOSIS — I4891 Unspecified atrial fibrillation: Secondary | ICD-10-CM | POA: Diagnosis not present

## 2017-06-26 DIAGNOSIS — I69391 Dysphagia following cerebral infarction: Secondary | ICD-10-CM | POA: Diagnosis not present

## 2017-06-26 DIAGNOSIS — J449 Chronic obstructive pulmonary disease, unspecified: Secondary | ICD-10-CM | POA: Diagnosis not present

## 2017-06-26 DIAGNOSIS — E669 Obesity, unspecified: Secondary | ICD-10-CM | POA: Diagnosis not present

## 2017-06-26 DIAGNOSIS — I4892 Unspecified atrial flutter: Secondary | ICD-10-CM | POA: Diagnosis not present

## 2017-06-26 DIAGNOSIS — F0390 Unspecified dementia without behavioral disturbance: Secondary | ICD-10-CM | POA: Diagnosis not present

## 2017-06-26 DIAGNOSIS — Z9981 Dependence on supplemental oxygen: Secondary | ICD-10-CM | POA: Diagnosis not present

## 2017-06-26 DIAGNOSIS — Z9181 History of falling: Secondary | ICD-10-CM | POA: Diagnosis not present

## 2017-07-02 DIAGNOSIS — I69322 Dysarthria following cerebral infarction: Secondary | ICD-10-CM | POA: Diagnosis not present

## 2017-07-02 DIAGNOSIS — F0390 Unspecified dementia without behavioral disturbance: Secondary | ICD-10-CM | POA: Diagnosis not present

## 2017-07-02 DIAGNOSIS — M6281 Muscle weakness (generalized): Secondary | ICD-10-CM | POA: Diagnosis not present

## 2017-07-02 DIAGNOSIS — E1159 Type 2 diabetes mellitus with other circulatory complications: Secondary | ICD-10-CM | POA: Diagnosis not present

## 2017-07-02 DIAGNOSIS — M4696 Unspecified inflammatory spondylopathy, lumbar region: Secondary | ICD-10-CM | POA: Diagnosis not present

## 2017-07-02 DIAGNOSIS — I69398 Other sequelae of cerebral infarction: Secondary | ICD-10-CM | POA: Diagnosis not present

## 2017-07-06 ENCOUNTER — Encounter (HOSPITAL_COMMUNITY): Payer: Self-pay | Admitting: Emergency Medicine

## 2017-07-06 ENCOUNTER — Other Ambulatory Visit: Payer: Self-pay

## 2017-07-06 ENCOUNTER — Inpatient Hospital Stay (HOSPITAL_COMMUNITY)
Admission: EM | Admit: 2017-07-06 | Discharge: 2017-07-08 | DRG: 378 | Disposition: A | Discharge status: Home / Self Care | Payer: Medicare Other | Attending: Nephrology | Admitting: Nephrology

## 2017-07-06 ENCOUNTER — Emergency Department (HOSPITAL_COMMUNITY): Payer: Medicare Other

## 2017-07-06 DIAGNOSIS — Z951 Presence of aortocoronary bypass graft: Secondary | ICD-10-CM

## 2017-07-06 DIAGNOSIS — G4730 Sleep apnea, unspecified: Secondary | ICD-10-CM | POA: Diagnosis present

## 2017-07-06 DIAGNOSIS — I2510 Atherosclerotic heart disease of native coronary artery without angina pectoris: Secondary | ICD-10-CM | POA: Diagnosis not present

## 2017-07-06 DIAGNOSIS — I69354 Hemiplegia and hemiparesis following cerebral infarction affecting left non-dominant side: Secondary | ICD-10-CM | POA: Diagnosis not present

## 2017-07-06 DIAGNOSIS — Z91048 Other nonmedicinal substance allergy status: Secondary | ICD-10-CM

## 2017-07-06 DIAGNOSIS — E876 Hypokalemia: Secondary | ICD-10-CM | POA: Diagnosis present

## 2017-07-06 DIAGNOSIS — Z888 Allergy status to other drugs, medicaments and biological substances status: Secondary | ICD-10-CM

## 2017-07-06 DIAGNOSIS — Z7982 Long term (current) use of aspirin: Secondary | ICD-10-CM

## 2017-07-06 DIAGNOSIS — Z7984 Long term (current) use of oral hypoglycemic drugs: Secondary | ICD-10-CM

## 2017-07-06 DIAGNOSIS — Z9049 Acquired absence of other specified parts of digestive tract: Secondary | ICD-10-CM

## 2017-07-06 DIAGNOSIS — J9611 Chronic respiratory failure with hypoxia: Secondary | ICD-10-CM | POA: Diagnosis present

## 2017-07-06 DIAGNOSIS — K921 Melena: Principal | ICD-10-CM | POA: Diagnosis present

## 2017-07-06 DIAGNOSIS — E1159 Type 2 diabetes mellitus with other circulatory complications: Secondary | ICD-10-CM

## 2017-07-06 DIAGNOSIS — Z87442 Personal history of urinary calculi: Secondary | ICD-10-CM

## 2017-07-06 DIAGNOSIS — K625 Hemorrhage of anus and rectum: Secondary | ICD-10-CM | POA: Diagnosis not present

## 2017-07-06 DIAGNOSIS — I10 Essential (primary) hypertension: Secondary | ICD-10-CM | POA: Diagnosis present

## 2017-07-06 DIAGNOSIS — H9190 Unspecified hearing loss, unspecified ear: Secondary | ICD-10-CM | POA: Diagnosis present

## 2017-07-06 DIAGNOSIS — I480 Paroxysmal atrial fibrillation: Secondary | ICD-10-CM | POA: Diagnosis present

## 2017-07-06 DIAGNOSIS — R197 Diarrhea, unspecified: Secondary | ICD-10-CM | POA: Diagnosis not present

## 2017-07-06 DIAGNOSIS — E785 Hyperlipidemia, unspecified: Secondary | ICD-10-CM | POA: Diagnosis present

## 2017-07-06 DIAGNOSIS — Z8249 Family history of ischemic heart disease and other diseases of the circulatory system: Secondary | ICD-10-CM

## 2017-07-06 DIAGNOSIS — E119 Type 2 diabetes mellitus without complications: Secondary | ICD-10-CM | POA: Diagnosis present

## 2017-07-06 DIAGNOSIS — Z9981 Dependence on supplemental oxygen: Secondary | ICD-10-CM

## 2017-07-06 DIAGNOSIS — D649 Anemia, unspecified: Secondary | ICD-10-CM | POA: Diagnosis not present

## 2017-07-06 DIAGNOSIS — K219 Gastro-esophageal reflux disease without esophagitis: Secondary | ICD-10-CM | POA: Diagnosis not present

## 2017-07-06 DIAGNOSIS — M1990 Unspecified osteoarthritis, unspecified site: Secondary | ICD-10-CM | POA: Diagnosis present

## 2017-07-06 DIAGNOSIS — F329 Major depressive disorder, single episode, unspecified: Secondary | ICD-10-CM | POA: Diagnosis present

## 2017-07-06 DIAGNOSIS — Z79899 Other long term (current) drug therapy: Secondary | ICD-10-CM

## 2017-07-06 LAB — URINALYSIS, ROUTINE W REFLEX MICROSCOPIC
BILIRUBIN URINE: NEGATIVE
Bacteria, UA: NONE SEEN
GLUCOSE, UA: NEGATIVE mg/dL
KETONES UR: NEGATIVE mg/dL
LEUKOCYTES UA: NEGATIVE
Nitrite: NEGATIVE
PH: 5 (ref 5.0–8.0)
PROTEIN: 30 mg/dL — AB
SQUAMOUS EPITHELIAL / LPF: NONE SEEN
Specific Gravity, Urine: 1.017 (ref 1.005–1.030)

## 2017-07-06 LAB — COMPREHENSIVE METABOLIC PANEL
ALBUMIN: 3.6 g/dL (ref 3.5–5.0)
ALK PHOS: 104 U/L (ref 38–126)
ALT: 18 U/L (ref 17–63)
ANION GAP: 12 (ref 5–15)
AST: 23 U/L (ref 15–41)
BUN: 10 mg/dL (ref 6–20)
CALCIUM: 8.9 mg/dL (ref 8.9–10.3)
CHLORIDE: 99 mmol/L — AB (ref 101–111)
CO2: 26 mmol/L (ref 22–32)
Creatinine, Ser: 0.84 mg/dL (ref 0.61–1.24)
GFR calc Af Amer: >60 mL/min (ref >60)
GFR calc non Af Amer: >60 mL/min (ref >60)
GLUCOSE: 163 mg/dL — AB (ref 65–99)
POTASSIUM: 3.3 mmol/L — AB (ref 3.5–5.1)
SODIUM: 137 mmol/L (ref 135–145)
Total Bilirubin: 0.4 mg/dL (ref 0.3–1.2)
Total Protein: 7.8 g/dL (ref 6.5–8.1)

## 2017-07-06 LAB — I-STAT CHEM 8, ED
BUN: 8 mg/dL (ref 6–20)
CREATININE: 0.8 mg/dL (ref 0.61–1.24)
Calcium, Ion: 1.11 mmol/L — ABNORMAL LOW (ref 1.15–1.40)
Chloride: 99 mmol/L — ABNORMAL LOW (ref 101–111)
GLUCOSE: 164 mg/dL — AB (ref 65–99)
HEMATOCRIT: 34 % — AB (ref 39.0–52.0)
HEMOGLOBIN: 11.6 g/dL — AB (ref 13.0–17.0)
Potassium: 3.3 mmol/L — ABNORMAL LOW (ref 3.5–5.1)
Sodium: 138 mmol/L (ref 135–145)
TCO2: 28 mmol/L (ref 22–32)

## 2017-07-06 LAB — CBC
HEMATOCRIT: 34.4 % — AB (ref 39.0–52.0)
Hemoglobin: 11 g/dL — ABNORMAL LOW (ref 13.0–17.0)
MCH: 29.5 pg (ref 26.0–34.0)
MCHC: 32 g/dL (ref 30.0–36.0)
MCV: 92.2 fL (ref 78.0–100.0)
Platelets: 240 10*3/uL (ref 150–400)
RBC: 3.73 MIL/uL — ABNORMAL LOW (ref 4.22–5.81)
RDW: 13.7 % (ref 11.5–15.5)
WBC: 12.7 10*3/uL — ABNORMAL HIGH (ref 4.0–10.5)

## 2017-07-06 LAB — LIPASE, BLOOD: LIPASE: 28 U/L (ref 11–51)

## 2017-07-06 MED ORDER — METRONIDAZOLE IN NACL 5-0.79 MG/ML-% IV SOLN
500.0000 mg | Freq: Once | INTRAVENOUS | Status: AC
  Administered 2017-07-07: 500 mg via INTRAVENOUS
  Filled 2017-07-06: qty 100

## 2017-07-06 MED ORDER — CIPROFLOXACIN IN D5W 400 MG/200ML IV SOLN
400.0000 mg | Freq: Once | INTRAVENOUS | Status: AC
  Administered 2017-07-07: 400 mg via INTRAVENOUS
  Filled 2017-07-06: qty 200

## 2017-07-06 MED ORDER — POTASSIUM CHLORIDE 10 MEQ/100ML IV SOLN
10.0000 meq | Freq: Once | INTRAVENOUS | Status: AC
  Administered 2017-07-07: 10 meq via INTRAVENOUS
  Filled 2017-07-06: qty 100

## 2017-07-06 MED ORDER — SODIUM CHLORIDE 0.9 % IV BOLUS (SEPSIS)
1000.0000 mL | Freq: Once | INTRAVENOUS | Status: AC
  Administered 2017-07-06: 1000 mL via INTRAVENOUS

## 2017-07-06 MED ORDER — IOPAMIDOL (ISOVUE-300) INJECTION 61%
INTRAVENOUS | Status: AC
  Administered 2017-07-06: 100 mL via INTRAVENOUS
  Filled 2017-07-06: qty 100

## 2017-07-06 NOTE — H&P (Addendum)
History and Physical   [facility] [place] [facility] Ad[facility] History and Physical [employer], D.O.    Patient Name: [name] MR#: [id number] Date of Birth: 10/19/1945 Date of Admission: 07/06/2017  Referring MD/NP/PA: Dr. Silverio Lay Primary Care Physician: Carleene Cooper, MD  Chief Complaint:  Chief Complaint  Patient presents with  . Diarrhea  . Rectal Bleeding    HPI: [name] is a 71 y.o. male with a known history of afib on aspirin, CVA with left sided weakness, DM, GERD, HTN, HLD, CAD, seizures, PE diverticulitis s/p colectomy presents to the emergency department for evaluation of bloody diarrhea and abdominal pain for the past day..  Patient was in a usual state of health until this past week when he was admitted to the [facility] hospital with diagnosis of gastroenteritis.  Patient denies fevers/chills, weakness, dizziness, chest pain, shortness of breath, N/V/C/D,  dysuria/frequency, changes in mental status.    Otherwise there has been no change in status. Patient has been taking medication as prescribed and there has been no recent change in medication or diet.  No recent antibiotics.  .    EMS/ED Course: Patient received Cipro, Flagyl, KCl, NS. Medical admission has been requested for further management of rectal bleeding, possible colitis.  Review of Systems:  CONSTITUTIONAL: No fever/chills, fatigue, weakness, weight gain/loss, headache. EYES: No blurry or double vision. ENT: No tinnitus, postnasal drip, redness or soreness of the oropharynx. RESPIRATORY: No cough, dyspnea, wheeze.  No hemoptysis.  CARDIOVASCULAR: No chest pain, palpitations, syncope, orthopnea. No lower extremity edema.  GASTROINTESTINAL: Positive abdominal pain, hematochezia. No nausea, vomiting, diarrhea, constipation.  No hematemesis, melena. GENITOURINARY: No dysuria, frequency, hematuria. ENDOCRINE: No polyuria or nocturia. No heat or cold intolerance. HEMATOLOGY: No anemia,  bruising, bleeding. INTEGUMENTARY: No rashes, ulcers, lesions. MUSCULOSKELETAL: No arthritis, gout, dyspnea. NEUROLOGIC: No numbness, tingling, ataxia, seizure-type activity, weakness. PSYCHIATRIC: No anxiety, depression, insomnia.   Past Medical History:  Diagnosis Date  . AKI (acute kidney injury) (HCC)    a. 6/14: resolved after d/c ARB;  b. RA U/S 7/14: no RA stenosis  . Arthritis    of spine  . Atrial flutter (HCC)   . Back pain    persistent  . Coronary artery disease    a. Low level exercise Lex MV 3/14: low risk, EF 62%, inf defect 2/2 diaph attenuation vs artifiact, small area of scar possible, no ischemia  . Depression   . Diabetes mellitus    controled by diet  . Diverticulitis   . GERD (gastroesophageal reflux disease)   . H/O alcohol abuse   . Headache(784.0)   . Hearing loss   . History of atrial flutter   . History of hiatal hernia   . History of kidney stones   . Hx of echocardiogram    Echo 7/14:  Mod LVH, EF 55-60%, Gr 1 DD, mild MR, PASP 36  . Hyperlipidemia   . Hypertension   . Renal stone 04/15/2014  . Seizures (HCC)   . Shortness of breath   . Sleep apnea    does not use CPAP  . Stroke [facility])     Past Surgical History:  Procedure Laterality Date  . APPENDECTOMY    . BACK SURGERY    . CARDIAC CATHETERIZATION    . CERVICAL FUSION    . CORONARY ARTERY BYPASS GRAFT  2005   LIMA graft to LAD,saphenous vein graft to diag.,circumflex, marginal,and to the RCA  . ORCHIECTOMY    . PARTIAL  COLECTOMY     for diverticuli  . UMBILICAL HERNIA REPAIR       reports that  has never smoked. he has never used smokeless tobacco. He reports that he does not drink alcohol or use drugs.  Allergies  Allergen Reactions  . Cephalexin     Unknown  . Doxycycline     Unknown  . Keflex [Cephalexin]   . Lac Bovis Other (See Comments)    lactose intolerant  . Pentazocine Lactate     He passed out- he had a seizure.  This occurred around 2000  . Talwin  [Pentazocine] Other (See Comments)    hallucinations  . Tape Other (See Comments)    Paper tape only please.  . Zanaflex [Tizanidine Hcl] Other (See Comments)    Lightheaded and dizzy    Family History  Problem Relation Age of Onset  . Emphysema Mother        was a smoker  . Asthma Mother   . Hypertension Mother   . Heart disease Father   . Prostate cancer Paternal Grandfather   . Pancreatic cancer Paternal Uncle   . Stroke Paternal Grandmother   . Heart attack Neg Hx     Prior to Admission medications   Medication Sig Start Date End Date Taking? Authorizing Provider  acetaminophen (TYLENOL) 500 MG tablet Take 500 mg by mouth every 6 (six) hours as needed.   Yes [provider]  ARIPiprazole (ABILIFY) 5 MG tablet Take 5 mg by mouth daily.    Yes [provider]  aspirin 325 MG EC tablet Take 325 mg daily by mouth.   Yes [provider]  atorvastatin (LIPITOR) 80 MG tablet Take 1 tablet (80 mg total) by mouth daily. Patient taking differently: Take 80 mg by mouth at bedtime.  10/29/13  Yes Copland, Gwenlyn Found, MD  diphenoxylate-atropine (LOMOTIL) 2.5-0.025 MG tablet Take 1 tablet 2 (two) times daily as needed by mouth for diarrhea or loose stools.   Yes [provider]  donepezil (ARICEPT ODT) 10 MG disintegrating tablet Take 10 mg by mouth daily.    Yes [provider]  FLUoxetine (PROZAC) 10 MG capsule Take 20 mg every morning by mouth.    Yes [provider]  fluticasone (FLONASE) 50 MCG/ACT nasal spray Place 2 sprays into both nostrils daily.   Yes [provider]  furosemide (LASIX) 20 MG tablet TAKE 1-2 TABLETS BY MOUTH EVERY DAY AS NEEDED Patient taking differently: 20 mg mg DAILY in the morning (4am) 10/24/14  Yes [place], Peter M, MD  glipiZIDE (GLUCOTROL) 5 MG tablet Take 2.5 mg by mouth 2 (two) times daily before a meal.    Yes [provider]  hydroxypropyl methylcellulose / hypromellose (ISOPTO TEARS /  GONIOVISC) 2.5 % ophthalmic solution Place 1 drop into both eyes as needed for dry eyes.   Yes [provider]  lamoTRIgine (LAMICTAL) 100 MG tablet Take 1 tablet (100 mg total) by mouth 2 (two) times daily. 10/03/15  Yes Marvel Plan, MD  loperamide (IMODIUM) 2 MG capsule Take 2 mg by mouth as needed for diarrhea or loose stools.   Yes [provider]  Multiple Vitamin (MULTIVITAMIN) capsule Take 1 capsule by mouth daily.   Yes [provider]  nitroGLYCERIN (NITROSTAT) 0.4 MG SL tablet Place 1 tablet (0.4 mg total) under the tongue every 5 (five) minutes as needed. For chest pain 03/10/15  Yes [place], Peter M, MD  omeprazole (PRILOSEC) 40 MG capsule Take 1  capsule (40 mg total) by mouth 2 (two) times daily before a meal. Patient taking differently: Take 20 mg daily by mouth.  04/26/17  Yes Clydia Llano, MD  ONE TOUCH ULTRA TEST test strip USE TO TEST BLOOD SUGAR DAILY 07/11/15  Yes Daub, Maylon Peppers, MD  potassium citrate (UROCIT-K) 10 MEQ (1080 MG) SR tablet Take 10 mEq 2 (two) times daily by mouth.  07/08/14  Yes [provider]  sitaGLIPtin (JANUVIA) 100 MG tablet Take 100 mg by mouth every morning. Reported on 09/19/2015   Yes [provider]  traZODone (DESYREL) 100 MG tablet Take 100 mg by mouth at bedtime.    Yes [provider]  Triamcinolone Acetonide (TRIAMCINOLONE 0.1 % CREAM : EUCERIN) CREA Apply 1 application topically daily. TRIAMCINOLONE 1%/ Absorbase   Yes [provider]  aspirin EC 81 MG tablet Take 1 tablet (81 mg total) by mouth daily. Patient not taking: Reported on 07/06/2017 12/24/16   Marvel Plan, MD  HYDROcodone-acetaminophen [facility]) 5-325 MG tablet Take 1-2 tablets by mouth every 6 (six) hours as needed. Patient not taking: Reported on 07/06/2017 04/15/17   Geoffery Lyons, MD  meclizine (ANTIVERT) 12.5 MG tablet Take 1 tablet (12.5 mg total) by mouth 2 (two) times daily as needed for dizziness. Patient not taking: Reported  on 07/06/2017 03/31/15   Wallis Bamberg, PA-C    Physical Exam: Vitals:   07/06/17 2128 07/06/17 2329  BP: 137/71 (!) 142/76  Pulse: 86 79  Resp: 20 (!) 21  Temp: 98.5 F (36.9 C)   TempSrc: Oral   SpO2: 97% 95%  Weight: 93.9 kg (207 lb)   Height: 5\' 6"  (1.676 m)     GENERAL: 71 y.o.-year-old male patient, chronically ill appearing, lying in the bed in no acute distress.  Pleasant and cooperative.   HEENT: Head atraumatic, normocephalic. Pupils equal. Mucus membranes moist. NECK: Supple, full range of motion.  CHEST: Normal breath sounds bilaterally. No wheezing, rales, rhonchi or crackles. No use of accessory muscles of respiration.  No reproducible chest wall tenderness.  CARDIOVASCULAR: S1, S2 normal. No murmurs, rubs, or gallops. Cap refill <2 seconds. Pulses intact distally.  ABDOMEN: Soft, nondistended, mild LLQ tenderness to palpation. No rebound, guarding, rigidity. Normoactive bowel sounds present in all four quadrants.  EXTREMITIES: Contracted LUE. No pedal edema, cyanosis, or clubbing. No calf tenderness or Homan's sign.  NEUROLOGIC: The patient is alert and oriented x 3. Cranial nerves II through XII are grossly intact with diminished LUE and LLE motor strength PSYCHIATRIC:  Normal affect, mood, thought content. SKIN: Warm, dry, and intact without obvious rash, lesion, or ulcer.    Labs on Admission:  CBC: Recent Labs  Lab 07/06/17 2155 07/06/17 2207  WBC 12.7*  --   HGB 11.0* 11.6*  HCT 34.4* 34.0*  MCV 92.2  --   PLT 240  --    Basic Metabolic Panel: Recent Labs  Lab 07/06/17 2155 07/06/17 2207  NA 137 138  K 3.3* 3.3*  CL 99* 99*  CO2 26  --   GLUCOSE 163* 164*  BUN 10 8  CREATININE 0.84 0.80  CALCIUM 8.9  --    GFR: Estimated Creatinine Clearance: 88.2 mL/min (by C-G formula based on SCr of 0.8 mg/dL). Liver Function Tests: Recent Labs  Lab 07/06/17 2155  AST 23  ALT 18  ALKPHOS 104  BILITOT 0.4  PROT 7.8  ALBUMIN 3.6   Recent Labs  Lab  07/06/17 2155  LIPASE 28   No results  for input(s): AMMONIA in the last 168 hours. Coagulation Profile: No results for input(s): INR, PROTIME in the last 168 hours. Cardiac Enzymes: No results for input(s): CKTOTAL, CKMB, CKMBINDEX, TROPONINI in the last 168 hours. BNP (last 3 results) No results for input(s): PROBNP in the last 8760 hours. HbA1C: No results for input(s): HGBA1C in the last 72 hours. CBG: No results for input(s): GLUCAP in the last 168 hours. Lipid Profile: No results for input(s): CHOL, HDL, LDLCALC, TRIG, CHOLHDL, LDLDIRECT in the last 72 hours. Thyroid Function Tests: No results for input(s): TSH, T4TOTAL, FREET4, T3FREE, THYROIDAB in the last 72 hours. Anemia Panel: No results for input(s): VITAMINB12, FOLATE, FERRITIN, TIBC, IRON, RETICCTPCT in the last 72 hours. Urine analysis:    Component Value Date/Time   COLORURINE YELLOW 07/06/2017 2155   APPEARANCEUR CLEAR 07/06/2017 2155   LABSPEC 1.017 07/06/2017 2155   PHURINE 5.0 07/06/2017 2155   GLUCOSEU NEGATIVE 07/06/2017 2155   HGBUR MODERATE (A) 07/06/2017 2155   BILIRUBINUR NEGATIVE 07/06/2017 2155   BILIRUBINUR neg 04/27/2014 1205   KETONESUR NEGATIVE 07/06/2017 2155   PROTEINUR 30 (A) 07/06/2017 2155   UROBILINOGEN 0.2 03/07/2015 1005   NITRITE NEGATIVE 07/06/2017 2155   LEUKOCYTESUR NEGATIVE 07/06/2017 2155   Sepsis Labs: @LABRCNTIP (procalcitonin:4,lacticidven:4) )No results found for this or any previous visit (from the past 240 hour(s)).   Radiological Exams on Admission: No results found.  Assessment/Plan  This is a 71 y.o. male with a history of afib on aspirin, CVA with left sided weakness, DM, GERD, HTN, HLD, CAD, seizures, PE diverticulitis s/p colectomy now being admitted with:   #. Lower GI Bleed, diarrhea - Admit inpatient - IV Protonix 40mg  BID - Serial CBCs - Nothing by mouth - IV fluid hydration - Continue aspirin as benefit > risk at this time.   - Continue Cipro, Flagyl  pending stool cultures - GI consultation has been requested  #. Hypokalemia, mild - Replaced in ED - Recheck BMP in AM  #. H/o Diabetes - Accuchecks q4h with RISS coverage - Hold glipizide, Januvia.   #. History of CAD - Continue nitroglycerin, Lipitor  Admission status: Inpatient IV Fluids: NS Diet/Nutrition: NPO Consults called: GI  DVT Px: SCDs and early ambulation. Chemoprophylaxis contraindicated 2/2 active GI bleed.  Code Status: Full Code  Disposition Plan: To home in 1-2 days  All the records are reviewed and case discussed with ED provider. Management plans discussed with the patient and/or family who express understanding and agree with plan of care.  Rebie Peale D.O. on 07/06/2017 at 11:58 PM Between 7am to 6pm - Pager - [phone] After 6pm go to [email or web address] - Social research officer, government [facility] Office [phone] CC: Primary care physician; Carleene Cooper, MD   07/06/2017, 11:58 PM

## 2017-07-06 NOTE — ED Triage Notes (Signed)
Patients wife states patient is having diarrhea more than normal. Patient started this two days ago. Wife states patient has been in the hospital for three days for this same thing. Wife thinks patient is dehydrated. She also states patient is raw on his bottom and is having some rectal bleeding.

## 2017-07-06 NOTE — ED Provider Notes (Addendum)
[facility] DEPT Provider Note   CSN: [id number] Arrival date & time: 07/06/17  2058     History   Chief Complaint Chief Complaint  Patient presents with  . Diarrhea  . Rectal Bleeding    HPI [name] is a 71 y.o. male hx of aflutter now only on ASA, DM, GERD, previous colectomy for diverticulitis, here presenting with diarrhea, abdominal pain, blood in stool.  Patient had watery diarrhea about 6 times an hour since the last 24 hours.  Over the last several hours, wife noticed some blood in the stool as well with each bowel movement so had more than 5 bloody bowel movements. Denies any vomiting or fevers. Recently admitted to [facility] hospital for diarrhea and had negative C diff and was told that he had gastroenteritis. No recent antibiotic use. He has chronic L sided weakness from previous stroke.   The history is provided by the patient.    Past Medical History:  Diagnosis Date  . AKI (acute kidney injury) ([place])    a. 6/14: resolved after d/c ARB;  b. RA U/S 7/14: no RA stenosis  . Arthritis    of spine  . Atrial flutter ([place])   . Back pain    persistent  . Coronary artery disease    a. Low level exercise Lex MV 3/14: low risk, EF 62%, inf defect 2/2 diaph attenuation vs artifiact, small area of scar possible, no ischemia  . Depression   . Diabetes mellitus    controled by diet  . Diverticulitis   . GERD (gastroesophageal reflux disease)   . H/O alcohol abuse   . Headache(784.0)   . Hearing loss   . History of atrial flutter   . History of hiatal hernia   . History of kidney stones   . Hx of echocardiogram    Echo 7/14:  Mod LVH, EF 55-60%, Gr 1 DD, mild MR, PASP 36  . Hyperlipidemia   . Hypertension   . Renal stone 04/15/2014  . Seizures ([place])   . Shortness of breath   . Sleep apnea    does not use CPAP  . Stroke [facility])     Patient Active Problem List   Diagnosis Date Noted  . Generalized weakness 04/25/2017  . General  weakness 04/24/2017  . Cerebrovascular accident (CVA) due to embolism of right anterior cerebral artery ([place]) 09/19/2015  . History of CVA with residual deficit 09/19/2015  . Type 2 diabetes mellitus with circulatory disorder ([place]) 07/12/2015  . HLD (hyperlipidemia) 07/12/2015  . Essential hypertension 07/12/2015  . Coronary artery disease involving coronary bypass graft of native heart with unspecified angina pectoris 07/12/2015  . Intracranial carotid stenosis 07/12/2015  . Morbid obesity ([place]) 02/21/2015  . Acute ischemic stroke ([place])   . Dysarthria   . History of pulmonary embolism   . Stroke ([place]) 02/11/2015  . CVA (cerebral infarction) 02/11/2015  . Acute right ACA stroke ([place]) 02/11/2015  . Lower extremity weakness 01/08/2015  . Status post placement of implantable loop recorder 08/01/2014  . Chronic respiratory failure, unspecified whether with hypoxia or hypercapnia ([place]) 05/19/2014  . Pulmonary embolism ([place]) 05/19/2014  . Hemiparesis and speech and language deficit as late effects of stroke ([place]) 04/19/2014  . Ureteral colic 40/98/1191  . Left ureteral stone 04/15/2014  . Obstructive sleep apnea 12/07/2013  . Obesity, unspecified 12/07/2013  . Embolic stroke ([place]) 47/82/9562  . Cough 06/09/2013  . S/P lumbar spine operation 06/02/2013  . Nerve root  pain 06/02/2013  . Pleural plaque consistent with asbestos exposure by CT 10/2012 02/17/2013  . Renal failure 02/13/2013  . Dyspnea 02/11/2013  . Dysphagia, pharyngoesophageal phase 05/12/2012  . Seizure disorder ([place]) 04/04/2012  . CAD (coronary artery disease) 06/13/2011  . HTN (hypertension) 06/13/2011  . Diabetes mellitus ([place]) 06/13/2011  . History of seizure disorder   . Depression   . Back pain   . Hearing loss   . Arrhythmia   . Diverticulitis   . H/O alcohol abuse   . Personal history of other diseases of circulatory system 05/29/2011    Past Surgical History:  Procedure Laterality Date  . APPENDECTOMY      . BACK SURGERY    . CARDIAC CATHETERIZATION    . CERVICAL FUSION    . CORONARY ARTERY BYPASS GRAFT  2005   LIMA graft to LAD,saphenous vein graft to diag.,circumflex, marginal,and to the RCA  . ORCHIECTOMY    . PARTIAL COLECTOMY     for diverticuli  . UMBILICAL HERNIA REPAIR         Home Medications    Prior to Admission medications   Medication Sig Start Date End Date Taking? Authorizing Provider  acetaminophen (TYLENOL) 500 MG tablet Take 500 mg by mouth every 6 (six) hours as needed.   Yes [provider]  ARIPiprazole (ABILIFY) 5 MG tablet Take 5 mg by mouth daily.    Yes [provider]  aspirin 325 MG EC tablet Take 325 mg daily by mouth.   Yes [provider]  atorvastatin (LIPITOR) 80 MG tablet Take 1 tablet (80 mg total) by mouth daily. Patient taking differently: Take 80 mg by mouth at bedtime.  10/29/13  Yes Copland, Gay Filler, MD  diphenoxylate-atropine (LOMOTIL) 2.5-0.025 MG tablet Take 1 tablet 2 (two) times daily as needed by mouth for diarrhea or loose stools.   Yes [provider]  donepezil (ARICEPT ODT) 10 MG disintegrating tablet Take 10 mg by mouth daily.    Yes [provider]  FLUoxetine (PROZAC) 10 MG capsule Take 20 mg every morning by mouth.    Yes [provider]  fluticasone (FLONASE) 50 MCG/ACT nasal spray Place 2 sprays into both nostrils daily.   Yes [provider]  furosemide (LASIX) 20 MG tablet TAKE 1-2 TABLETS BY MOUTH EVERY DAY AS NEEDED Patient taking differently: 20 mg mg DAILY in the morning (4am) 10/24/14  Yes Martinique, Peter M, MD  glipiZIDE (GLUCOTROL) 5 MG tablet Take 2.5 mg by mouth 2 (two) times daily before a meal.    Yes [provider]  hydroxypropyl methylcellulose / hypromellose (ISOPTO TEARS / GONIOVISC) 2.5 % ophthalmic solution Place 1 drop into both eyes as needed for dry eyes.   Yes [provider]  lamoTRIgine (LAMICTAL) 100 MG tablet Take 1 tablet (100  mg total) by mouth 2 (two) times daily. 10/03/15  Yes Rosalin Hawking, MD  loperamide (IMODIUM) 2 MG capsule Take 2 mg by mouth as needed for diarrhea or loose stools.   Yes [provider]  Multiple Vitamin (MULTIVITAMIN) capsule Take 1 capsule by mouth daily.   Yes [provider]  nitroGLYCERIN (NITROSTAT) 0.4 MG SL tablet Place 1 tablet (0.4 mg total) under the tongue every 5 (five) minutes as needed. For chest pain 03/10/15  Yes Martinique, Peter M, MD  omeprazole (PRILOSEC) 40 MG capsule Take 1 capsule (40 mg total) by mouth 2 (two) times daily before a meal. Patient taking differently: Take 20 mg daily  by mouth.  04/26/17  Yes Verlee Monte, MD  ONE TOUCH ULTRA TEST test strip USE TO TEST BLOOD SUGAR DAILY 07/11/15  Yes Daub, Loura Back, MD  potassium citrate (UROCIT-K) 10 MEQ (1080 MG) SR tablet Take 10 mEq 2 (two) times daily by mouth.  07/08/14  Yes [provider]  sitaGLIPtin (JANUVIA) 100 MG tablet Take 100 mg by mouth every morning. Reported on 09/19/2015   Yes [provider]  traZODone (DESYREL) 100 MG tablet Take 100 mg by mouth at bedtime.    Yes [provider]  Triamcinolone Acetonide (TRIAMCINOLONE 0.1 % CREAM : EUCERIN) CREA Apply 1 application topically daily. TRIAMCINOLONE 1%/ Absorbase   Yes [provider]  aspirin EC 81 MG tablet Take 1 tablet (81 mg total) by mouth daily. Patient not taking: Reported on 07/06/2017 12/24/16   Rosalin Hawking, MD  HYDROcodone-acetaminophen [facility]) 5-325 MG tablet Take 1-2 tablets by mouth every 6 (six) hours as needed. Patient not taking: Reported on 07/06/2017 04/15/17   Veryl Speak, MD  meclizine (ANTIVERT) 12.5 MG tablet Take 1 tablet (12.5 mg total) by mouth 2 (two) times daily as needed for dizziness. Patient not taking: Reported on 07/06/2017 03/31/15   Jaynee Eagles, PA-C    Family History Family History  Problem Relation Age of Onset  . Emphysema Mother        was a smoker  . Asthma Mother   .  Hypertension Mother   . Heart disease Father   . Prostate cancer Paternal Grandfather   . Pancreatic cancer Paternal Uncle   . Stroke Paternal Grandmother   . Heart attack Neg Hx     Social History Social History   Tobacco Use  . Smoking status: Never Smoker  . Smokeless tobacco: Never Used  Substance Use Topics  . Alcohol use: No    Comment: rare beer  . Drug use: No     Allergies   Cephalexin; Doxycycline; Keflex [cephalexin]; Lac bovis; Pentazocine lactate; Talwin [pentazocine]; Tape; and Zanaflex [tizanidine hcl]   Review of Systems Review of Systems  Gastrointestinal: Positive for diarrhea and hematochezia.  All other systems reviewed and are negative.    Physical Exam Updated Vital Signs BP (!) 142/76   Pulse 79   Temp 98.5 F (36.9 C) (Oral)   Resp (!) 21   Ht 5' 6"  (1.676 m)   Wt 93.9 kg (207 lb)   SpO2 95%   BMI 33.41 kg/m   Physical Exam  Constitutional: He is oriented to person, place, and time.  Chronically ill   HENT:  Head: Normocephalic.  MM dry   Eyes: Conjunctivae and EOM are normal. Pupils are equal, round, and reactive to light.  Neck: Normal range of motion. Neck supple.  Cardiovascular: Normal rate, regular rhythm and normal heart sounds.  Pulmonary/Chest: Effort normal and breath sounds normal. No stridor. No respiratory distress. He has no wheezes.  Abdominal: Soft.  Previous abdominal scars. Mild diffuse lower abdominal tenderness, no rebound   Genitourinary:  Genitourinary Comments: Some dry blood at rectal vault. No obvious hemorrhoids.   Musculoskeletal: Normal range of motion.  Neurological: He is alert and oriented to person, place, and time.  L sided contractures (chronic)   Skin: Skin is warm.  Psychiatric: He has a normal mood and affect.  Nursing note and vitals reviewed.    ED Treatments / Results  Labs (all labs ordered are listed, but only abnormal results are displayed) Labs Reviewed  COMPREHENSIVE METABOLIC  PANEL -  Abnormal; Notable for the following components:      Result Value   Potassium 3.3 (*)    Chloride 99 (*)    Glucose, Bld 163 (*)    All other components within normal limits  CBC - Abnormal; Notable for the following components:   WBC 12.7 (*)    RBC 3.73 (*)    Hemoglobin 11.0 (*)    HCT 34.4 (*)    All other components within normal limits  URINALYSIS, ROUTINE W REFLEX MICROSCOPIC - Abnormal; Notable for the following components:   Hgb urine dipstick MODERATE (*)    Protein, ur 30 (*)    All other components within normal limits  I-STAT CHEM 8, ED - Abnormal; Notable for the following components:   Potassium 3.3 (*)    Chloride 99 (*)    Glucose, Bld 164 (*)    Calcium, Ion 1.11 (*)    Hemoglobin 11.6 (*)    HCT 34.0 (*)    All other components within normal limits  GASTROINTESTINAL PANEL BY PCR, STOOL (REPLACES STOOL CULTURE)  C DIFFICILE QUICK SCREEN W PCR REFLEX  LIPASE, BLOOD  POC OCCULT BLOOD, ED    EKG  EKG Interpretation None       Radiology Ct Abdomen Pelvis W Contrast  Result Date: 07/07/2017 CLINICAL DATA:  Acute onset of diarrhea. Dehydration. Rectal bleeding. EXAM: CT ABDOMEN AND PELVIS WITH CONTRAST TECHNIQUE: Multidetector CT imaging of the abdomen and pelvis was performed using the standard protocol following bolus administration of intravenous contrast. CONTRAST:  <See Chart> ISOVUE-300 IOPAMIDOL (ISOVUE-300) INJECTION 61% COMPARISON:  CT of the abdomen and pelvis performed 03/05/2017, and renal ultrasound performed 04/10/2017 FINDINGS: Lower chest: Scattered coronary artery calcifications are seen. The patient is status post median sternotomy. The visualized lung bases are grossly clear. Hepatobiliary: The liver is unremarkable in appearance. The gallbladder is unremarkable in appearance. The common bile duct remains normal in caliber. Pancreas: The pancreas is within normal limits. Spleen: The spleen is unremarkable in appearance.  Adrenals/Urinary Tract: The adrenal glands are unremarkable in appearance. The kidneys are within normal limits. There is no evidence of hydronephrosis. No renal or ureteral stones are identified. Nonspecific perinephric stranding is noted bilaterally. Stomach/Bowel: The ileocolonic anastomosis at the upper pelvis is grossly unremarkable in appearance. The patient is status post resection of much of the colon. Remaining small bowel is unremarkable. The stomach is within normal limits. The distal sigmoid colon and rectum are unremarkable in appearance. There is no obvious source for the patient's rectal bleeding. Vascular/Lymphatic: Scattered calcification is seen along the abdominal aorta and its branches. The abdominal aorta is otherwise grossly unremarkable. The inferior vena cava is grossly unremarkable. No retroperitoneal lymphadenopathy is seen. No pelvic sidewall lymphadenopathy is identified. Reproductive: The bladder is mildly distended and grossly unremarkable. The prostate remains normal in size. A penile implant reservoir is noted at the right hemipelvis. Other: No additional soft tissue abnormalities are seen. Musculoskeletal: No acute osseous abnormalities are identified. The patient is status post lumbosacral spinal fusion at L5-S1. The visualized musculature is unremarkable in appearance. IMPRESSION: 1. No acute abnormality seen to explain the patient's symptoms. 2. Ileocolic anastomosis is unremarkable in appearance. 3. Scattered coronary artery calcification. 4.  Aortic Atherosclerosis (ICD10-I70.0). Electronically Signed   By: Garald Balding M.D.   On: 07/07/2017 00:04    Procedures Procedures (including critical care time)  Angiocath insertion Performed by: Wandra Arthurs  Consent: Verbal consent obtained. Risks and benefits: risks, benefits and alternatives were  discussed Time out: Immediately prior to procedure a "time out" was called to verify the correct patient, procedure, equipment,  support staff and site/side marked as required.  Preparation: Patient was prepped and draped in the usual sterile fashion.  Vein Location: R antecube  Ultrasound Guided  Gauge: 20 long   Normal blood return and flush without difficulty Patient tolerance: Patient tolerated the procedure well with no immediate complications.     Medications Ordered in ED Medications  ciprofloxacin (CIPRO) IVPB 400 mg (not administered)  metroNIDAZOLE (FLAGYL) IVPB 500 mg (not administered)  potassium chloride 10 mEq in 100 mL IVPB (not administered)  sodium chloride 0.9 % bolus 1,000 mL (1,000 mLs Intravenous New Bag/Given 07/06/17 2205)  iopamidol (ISOVUE-300) 61 % injection (100 mLs Intravenous Contrast Given 07/06/17 2255)     Initial Impression / Assessment and Plan / ED Course  I have reviewed the triage vital signs and the nursing notes.  Pertinent labs & imaging results that were available during my care of the patient were reviewed by me and considered in my medical decision making (see chart for details).     [name] is a 71 y.o. male here with watery diarrhea about 6 times an hour, bloody stools. Had colectomy for previous diverticulitis. Will get labs, CT ab/pel. Occ grossly positive. Consider C diff vs sigmoiditis.   12:10 AM WBC 13. Hg 11, similar to previous. Ordered C diff and GI pathogen panel. I ordered cipro/flagyl empirically while waiting GI pathogen, C diff. Hosptalist to admit.      Final Clinical Impressions(s) / ED Diagnoses   Final diagnoses:  None    ED Discharge Orders    None       Drenda Freeze, MD 07/06/17 2359    Drenda Freeze, MD 07/07/17 0010

## 2017-07-07 ENCOUNTER — Ambulatory Visit: Payer: Medicare Other | Admitting: Neurology

## 2017-07-07 ENCOUNTER — Telehealth: Payer: Self-pay

## 2017-07-07 ENCOUNTER — Encounter (HOSPITAL_COMMUNITY): Payer: Self-pay | Admitting: Gastroenterology

## 2017-07-07 DIAGNOSIS — Z888 Allergy status to other drugs, medicaments and biological substances status: Secondary | ICD-10-CM | POA: Diagnosis not present

## 2017-07-07 DIAGNOSIS — Z9981 Dependence on supplemental oxygen: Secondary | ICD-10-CM | POA: Diagnosis not present

## 2017-07-07 DIAGNOSIS — I480 Paroxysmal atrial fibrillation: Secondary | ICD-10-CM | POA: Diagnosis present

## 2017-07-07 DIAGNOSIS — K921 Melena: Secondary | ICD-10-CM | POA: Diagnosis present

## 2017-07-07 DIAGNOSIS — E119 Type 2 diabetes mellitus without complications: Secondary | ICD-10-CM | POA: Diagnosis present

## 2017-07-07 DIAGNOSIS — M1990 Unspecified osteoarthritis, unspecified site: Secondary | ICD-10-CM | POA: Diagnosis present

## 2017-07-07 DIAGNOSIS — E876 Hypokalemia: Secondary | ICD-10-CM | POA: Diagnosis present

## 2017-07-07 DIAGNOSIS — K219 Gastro-esophageal reflux disease without esophagitis: Secondary | ICD-10-CM | POA: Diagnosis present

## 2017-07-07 DIAGNOSIS — Z87442 Personal history of urinary calculi: Secondary | ICD-10-CM | POA: Diagnosis not present

## 2017-07-07 DIAGNOSIS — H9190 Unspecified hearing loss, unspecified ear: Secondary | ICD-10-CM | POA: Diagnosis present

## 2017-07-07 DIAGNOSIS — G4730 Sleep apnea, unspecified: Secondary | ICD-10-CM | POA: Diagnosis present

## 2017-07-07 DIAGNOSIS — I10 Essential (primary) hypertension: Secondary | ICD-10-CM | POA: Diagnosis not present

## 2017-07-07 DIAGNOSIS — E785 Hyperlipidemia, unspecified: Secondary | ICD-10-CM | POA: Diagnosis present

## 2017-07-07 DIAGNOSIS — J9611 Chronic respiratory failure with hypoxia: Secondary | ICD-10-CM | POA: Diagnosis present

## 2017-07-07 DIAGNOSIS — K625 Hemorrhage of anus and rectum: Secondary | ICD-10-CM | POA: Diagnosis present

## 2017-07-07 DIAGNOSIS — Z79899 Other long term (current) drug therapy: Secondary | ICD-10-CM | POA: Diagnosis not present

## 2017-07-07 DIAGNOSIS — Z951 Presence of aortocoronary bypass graft: Secondary | ICD-10-CM | POA: Diagnosis not present

## 2017-07-07 DIAGNOSIS — F329 Major depressive disorder, single episode, unspecified: Secondary | ICD-10-CM | POA: Diagnosis present

## 2017-07-07 DIAGNOSIS — Z7984 Long term (current) use of oral hypoglycemic drugs: Secondary | ICD-10-CM | POA: Diagnosis not present

## 2017-07-07 DIAGNOSIS — R197 Diarrhea, unspecified: Secondary | ICD-10-CM | POA: Diagnosis not present

## 2017-07-07 DIAGNOSIS — D500 Iron deficiency anemia secondary to blood loss (chronic): Secondary | ICD-10-CM | POA: Diagnosis not present

## 2017-07-07 DIAGNOSIS — D649 Anemia, unspecified: Secondary | ICD-10-CM

## 2017-07-07 DIAGNOSIS — I2510 Atherosclerotic heart disease of native coronary artery without angina pectoris: Secondary | ICD-10-CM | POA: Diagnosis present

## 2017-07-07 DIAGNOSIS — I69354 Hemiplegia and hemiparesis following cerebral infarction affecting left non-dominant side: Secondary | ICD-10-CM | POA: Diagnosis not present

## 2017-07-07 DIAGNOSIS — Z91048 Other nonmedicinal substance allergy status: Secondary | ICD-10-CM | POA: Diagnosis not present

## 2017-07-07 DIAGNOSIS — E1159 Type 2 diabetes mellitus with other circulatory complications: Secondary | ICD-10-CM | POA: Diagnosis not present

## 2017-07-07 DIAGNOSIS — Z7982 Long term (current) use of aspirin: Secondary | ICD-10-CM | POA: Diagnosis not present

## 2017-07-07 DIAGNOSIS — Z8249 Family history of ischemic heart disease and other diseases of the circulatory system: Secondary | ICD-10-CM | POA: Diagnosis not present

## 2017-07-07 LAB — IRON AND TIBC
IRON: 71 ug/dL (ref 45–182)
Saturation Ratios: 21 % (ref 17.9–39.5)
TIBC: 344 ug/dL (ref 250–450)
UIBC: 273 ug/dL

## 2017-07-07 LAB — BASIC METABOLIC PANEL
Anion gap: 9 (ref 5–15)
BUN: 8 mg/dL (ref 6–20)
CALCIUM: 8.5 mg/dL — AB (ref 8.9–10.3)
CHLORIDE: 103 mmol/L (ref 101–111)
CO2: 26 mmol/L (ref 22–32)
CREATININE: 0.71 mg/dL (ref 0.61–1.24)
GFR calc non Af Amer: >60 mL/min (ref >60)
Glucose, Bld: 114 mg/dL — ABNORMAL HIGH (ref 65–99)
Potassium: 3.6 mmol/L (ref 3.5–5.1)
SODIUM: 138 mmol/L (ref 135–145)

## 2017-07-07 LAB — GLUCOSE, CAPILLARY
GLUCOSE-CAPILLARY: 95 mg/dL (ref 65–99)
Glucose-Capillary: 164 mg/dL — ABNORMAL HIGH (ref 65–99)
Glucose-Capillary: 142 mg/dL — ABNORMAL HIGH (ref 65–99)

## 2017-07-07 LAB — C DIFFICILE QUICK SCREEN W PCR REFLEX
C DIFFICILE (CDIFF) TOXIN: NEGATIVE
C Diff antigen: NEGATIVE
C Diff interpretation: NOT DETECTED

## 2017-07-07 LAB — CBC
HCT: 34.3 % — ABNORMAL LOW (ref 39.0–52.0)
HCT: 31 % — ABNORMAL LOW (ref 39.0–52.0)
HCT: 31.2 % — ABNORMAL LOW (ref 39.0–52.0)
HEMOGLOBIN: 10.9 g/dL — AB (ref 13.0–17.0)
HEMOGLOBIN: 10.1 g/dL — AB (ref 13.0–17.0)
Hemoglobin: 10.1 g/dL — ABNORMAL LOW (ref 13.0–17.0)
MCH: 29.3 pg (ref 26.0–34.0)
MCH: 30 pg (ref 26.0–34.0)
MCH: 30 pg (ref 26.0–34.0)
MCHC: 31.8 g/dL (ref 30.0–36.0)
MCHC: 32.6 g/dL (ref 30.0–36.0)
MCHC: 32.4 g/dL (ref 30.0–36.0)
MCV: 92.2 fL (ref 78.0–100.0)
MCV: 92 fL (ref 78.0–100.0)
MCV: 92.6 fL (ref 78.0–100.0)
PLATELETS: 220 10*3/uL (ref 150–400)
PLATELETS: 217 10*3/uL (ref 150–400)
Platelets: 216 10*3/uL (ref 150–400)
RBC: 3.72 MIL/uL — AB (ref 4.22–5.81)
RBC: 3.37 MIL/uL — ABNORMAL LOW (ref 4.22–5.81)
RBC: 3.37 MIL/uL — ABNORMAL LOW (ref 4.22–5.81)
RDW: 13.8 % (ref 11.5–15.5)
RDW: 13.9 % (ref 11.5–15.5)
RDW: 13.7 % (ref 11.5–15.5)
WBC: 11.4 10*3/uL — AB (ref 4.0–10.5)
WBC: 9.2 10*3/uL (ref 4.0–10.5)
WBC: 8 10*3/uL (ref 4.0–10.5)

## 2017-07-07 LAB — FERRITIN: Ferritin: 40 ng/mL (ref 24–336)

## 2017-07-07 LAB — OCCULT BLOOD, POC DEVICE: Fecal Occult Bld: POSITIVE — AB

## 2017-07-07 MED ORDER — ALBUTEROL SULFATE (2.5 MG/3ML) 0.083% IN NEBU: 2.5000 mg | INHALATION_SOLUTION | Freq: Four times a day (QID) | RESPIRATORY_TRACT | Status: DC | PRN

## 2017-07-07 MED ORDER — OXYCODONE HCL 5 MG PO TABS
5.0000 mg | ORAL_TABLET | ORAL | Status: DC | PRN
  Administered 2017-07-07 – 2017-07-08 (×2): 5 mg via ORAL
  Filled 2017-07-07 (×2): qty 1

## 2017-07-07 MED ORDER — DONEPEZIL HCL 10 MG PO TABS
10.0000 mg | ORAL_TABLET | Freq: Every day | ORAL | Status: DC
  Administered 2017-07-07 – 2017-07-08 (×2): 10 mg via ORAL
  Filled 2017-07-07 (×2): qty 1

## 2017-07-07 MED ORDER — SODIUM CHLORIDE 0.9 % IV SOLN
INTRAVENOUS | Status: DC
  Administered 2017-07-07 (×2): via INTRAVENOUS

## 2017-07-07 MED ORDER — ARIPIPRAZOLE 5 MG PO TABS
5.0000 mg | ORAL_TABLET | Freq: Every day | ORAL | Status: DC
  Administered 2017-07-07 – 2017-07-08 (×2): 5 mg via ORAL
  Filled 2017-07-07 (×2): qty 1

## 2017-07-07 MED ORDER — ONDANSETRON HCL 4 MG/2ML IJ SOLN: 4.0000 mg | Freq: Four times a day (QID) | INTRAMUSCULAR | Status: DC | PRN

## 2017-07-07 MED ORDER — ACETAMINOPHEN 650 MG RE SUPP: 650.0000 mg | Freq: Four times a day (QID) | RECTAL | Status: DC | PRN

## 2017-07-07 MED ORDER — PANTOPRAZOLE SODIUM 40 MG IV SOLR
40.0000 mg | Freq: Two times a day (BID) | INTRAVENOUS | Status: DC
  Administered 2017-07-07 – 2017-07-08 (×4): 40 mg via INTRAVENOUS
  Filled 2017-07-07 (×4): qty 40

## 2017-07-07 MED ORDER — FLUTICASONE PROPIONATE 50 MCG/ACT NA SUSP
2.0000 | Freq: Every day | NASAL | Status: DC
  Administered 2017-07-07 – 2017-07-08 (×2): 2 via NASAL
  Filled 2017-07-07: qty 16

## 2017-07-07 MED ORDER — POLYVINYL ALCOHOL 1.4 % OP SOLN
1.0000 [drp] | OPHTHALMIC | Status: DC | PRN
  Filled 2017-07-07: qty 15

## 2017-07-07 MED ORDER — ACETAMINOPHEN 325 MG PO TABS: 650.0000 mg | ORAL_TABLET | Freq: Four times a day (QID) | ORAL | Status: DC | PRN

## 2017-07-07 MED ORDER — INSULIN ASPART 100 UNIT/ML ~~LOC~~ SOLN
0.0000 [IU] | Freq: Three times a day (TID) | SUBCUTANEOUS | Status: DC
  Administered 2017-07-07: 2 [IU] via SUBCUTANEOUS
  Administered 2017-07-08 (×2): 1 [IU] via SUBCUTANEOUS

## 2017-07-07 MED ORDER — TRAZODONE HCL 100 MG PO TABS
100.0000 mg | ORAL_TABLET | Freq: Every day | ORAL | Status: DC
  Administered 2017-07-07 (×2): 100 mg via ORAL
  Filled 2017-07-07 (×2): qty 1

## 2017-07-07 MED ORDER — FUROSEMIDE 20 MG PO TABS
20.0000 mg | ORAL_TABLET | Freq: Every day | ORAL | Status: DC
  Administered 2017-07-07: 20 mg via ORAL
  Filled 2017-07-07: qty 1

## 2017-07-07 MED ORDER — FLUOXETINE HCL 20 MG PO CAPS
20.0000 mg | ORAL_CAPSULE | Freq: Every morning | ORAL | Status: DC
  Administered 2017-07-07 – 2017-07-08 (×2): 20 mg via ORAL
  Filled 2017-07-07 (×2): qty 1

## 2017-07-07 MED ORDER — ASPIRIN EC 325 MG PO TBEC
325.0000 mg | DELAYED_RELEASE_TABLET | Freq: Every day | ORAL | Status: DC
  Administered 2017-07-07 – 2017-07-08 (×2): 325 mg via ORAL
  Filled 2017-07-07 (×2): qty 1

## 2017-07-07 MED ORDER — ADULT MULTIVITAMIN W/MINERALS CH
1.0000 | ORAL_TABLET | Freq: Every day | ORAL | Status: DC
  Administered 2017-07-07 – 2017-07-08 (×2): 1 via ORAL
  Filled 2017-07-07 (×2): qty 1

## 2017-07-07 MED ORDER — ATORVASTATIN CALCIUM 40 MG PO TABS
80.0000 mg | ORAL_TABLET | Freq: Every day | ORAL | Status: DC
  Administered 2017-07-07 (×2): 80 mg via ORAL
  Filled 2017-07-07 (×2): qty 2

## 2017-07-07 MED ORDER — METRONIDAZOLE IN NACL 5-0.79 MG/ML-% IV SOLN
500.0000 mg | Freq: Three times a day (TID) | INTRAVENOUS | Status: DC
  Administered 2017-07-07: 500 mg via INTRAVENOUS
  Filled 2017-07-07 (×3): qty 100

## 2017-07-07 MED ORDER — LAMOTRIGINE 100 MG PO TABS
100.0000 mg | ORAL_TABLET | Freq: Two times a day (BID) | ORAL | Status: DC
  Administered 2017-07-07 – 2017-07-08 (×4): 100 mg via ORAL
  Filled 2017-07-07 (×4): qty 1

## 2017-07-07 MED ORDER — CIPROFLOXACIN IN D5W 400 MG/200ML IV SOLN
400.0000 mg | Freq: Two times a day (BID) | INTRAVENOUS | Status: DC
  Administered 2017-07-07: 400 mg via INTRAVENOUS
  Filled 2017-07-07 (×2): qty 200

## 2017-07-07 MED ORDER — IPRATROPIUM BROMIDE 0.02 % IN SOLN: 0.5000 mg | Freq: Four times a day (QID) | RESPIRATORY_TRACT | Status: DC | PRN

## 2017-07-07 MED ORDER — ONDANSETRON HCL 4 MG PO TABS: 4.0000 mg | ORAL_TABLET | Freq: Four times a day (QID) | ORAL | Status: DC | PRN

## 2017-07-07 MED ORDER — POTASSIUM CHLORIDE CRYS ER 10 MEQ PO TBCR
10.0000 meq | EXTENDED_RELEASE_TABLET | Freq: Two times a day (BID) | ORAL | Status: DC
  Administered 2017-07-07 – 2017-07-08 (×4): 10 meq via ORAL
  Filled 2017-07-07 (×4): qty 1

## 2017-07-07 MED ORDER — NITROGLYCERIN 0.4 MG SL SUBL: 0.4000 mg | SUBLINGUAL_TABLET | SUBLINGUAL | Status: DC | PRN

## 2017-07-07 NOTE — Progress Notes (Signed)
PROGRESS NOTE  [name] TKP:[id number] DOB: 08-26-1946 DOA: 07/06/2017 PCP: Bernell List, MD   LOS: 0 days   Brief Narrative / Interim history: 71 year-old male with a PMH significant for A-fib and CVA on ASA but no AC, and diverticular disease s/p colectomy in 2006 who presented to the ED on 11/11 with increased diarrhea for the past month. He noticed some bright red blood in the stool over the past week that increased to the point that he sought medical attention. He was admitted at the [facility] this past week for treatment of colitis. Hgb stable at 10.1, CT abd negative for acute findings. Last colonoscopy 3-4 years ago per pt. Pt was admitted with plan for GI consult.    Assessment & Plan: Active Problems:   Rectal bleeding   Acute lower GI bleed with diarrhea Pt with chronic diarrhea increased of late with bright red bleeing for the past week. No acute pathology on CT, Hgb stable at 10.1. Pt started on clear liquids today, will advance as able. GI consulted, recommends observation of stools for bleeding +/- flex sig and endoscopy. Will continue ASA for now and continue to monitor for recurrent GI bleeding. Continue Protonix. Clinical picture less concerning for colitis, discontinuing Cipro and Flagyl started empirically. Will follow culture.   Mild hypokalemia k 3.3 on admission, repleted. 3.6 this morning, will recheck BMP in the morning.   Type 2 diabetes mellitus without complication Holding home diabetes meds for now. Continue CBG monitoring, will add SSI.   History of CAD On Lipitor and PRN NTG. Will continue.   DVT prophylaxis: SCDs Code Status: full code Family Communication: not at bedside Disposition Plan: home  Consultants:   GI  Procedures:     Antimicrobials:  Cipro  Flagyl  Subjective: Pt doing better today, no abd pain, n/v, cp, sob. One BM this morning, no blood.    Objective: Vitals:   07/06/17 2329 07/07/17 0108 07/07/17 0157 07/07/17 0533    BP: (!) 142/76 (!) 151/78 138/64 (!) 109/55  Pulse: 79 73 75 72  Resp: (!) 21 (!) 27 (!) 22 20  Temp:   97.8 F (36.6 C) 97.9 F (36.6 C)  TempSrc:   Oral Oral  SpO2: 95% 97% 99% 98%  Weight:      Height:        Intake/Output Summary (Last 24 hours) at 07/07/2017 1241 Last data filed at 07/07/2017 0800 Gross per 24 hour  Intake 1341.25 ml  Output 400 ml  Net 941.25 ml   Filed Weights   07/06/17 2128  Weight: 93.9 kg (207 lb)    Examination:  Constitutional: NAD Eyes:  lids and conjunctivae normal ENMT: Mucous membranes are moist.  Neck: normal, supple, no masses, no thyromegaly Respiratory: clear to auscultation bilaterally, no wheezing, no crackles. Normal respiratory effort. No accessory muscle use.  Cardiovascular: Regular rate and rhythm, no murmurs / rubs / gallops. No LE edema. 2+ pedal pulses. No carotid bruits.  Abdomen: no tenderness. Bowel sounds positive.  Musculoskeletal: no clubbing / cyanosis. No joint deformity upper and lower extremities. No contractures. Normal muscle tone.  Skin: no rashes, lesions, ulcers.  Neurologic:Grossly non-focal  Psychiatric: Normal judgment and insight. Alert and oriented x 3. Normal mood.    Data Reviewed: I have independently reviewed following labs and imaging studies  CBC: Recent Labs  Lab 07/06/17 2155 07/06/17 2207 07/07/17 0315 07/07/17 0703 07/07/17 1043  WBC 12.7*  --  11.4* 9.2 8.0  HGB 11.0* 11.6*  10.9* 10.1* 10.1*  HCT 34.4* 34.0* 34.3* 31.0* 31.2*  MCV 92.2  --  92.2 92.0 92.6  PLT 240  --  220 217 443   Basic Metabolic Panel: Recent Labs  Lab 07/06/17 2155 07/06/17 2207 07/07/17 0703  NA 137 138 138  K 3.3* 3.3* 3.6  CL 99* 99* 103  CO2 26  --  26  GLUCOSE 163* 164* 114*  BUN 10 8 8   CREATININE 0.84 0.80 0.71  CALCIUM 8.9  --  8.5*   GFR: Estimated Creatinine Clearance: 88.2 mL/min (by C-G formula based on SCr of 0.71 mg/dL). Liver Function Tests: Recent Labs  Lab 07/06/17 2155  AST  23  ALT 18  ALKPHOS 104  BILITOT 0.4  PROT 7.8  ALBUMIN 3.6   Recent Labs  Lab 07/06/17 2155  LIPASE 28   No results for input(s): AMMONIA in the last 168 hours. Coagulation Profile: No results for input(s): INR, PROTIME in the last 168 hours. Cardiac Enzymes: No results for input(s): CKTOTAL, CKMB, CKMBINDEX, TROPONINI in the last 168 hours. BNP (last 3 results) No results for input(s): PROBNP in the last 8760 hours. HbA1C: No results for input(s): HGBA1C in the last 72 hours. CBG: Recent Labs  Lab 07/07/17 0200  GLUCAP 95   Lipid Profile: No results for input(s): CHOL, HDL, LDLCALC, TRIG, CHOLHDL, LDLDIRECT in the last 72 hours. Thyroid Function Tests: No results for input(s): TSH, T4TOTAL, FREET4, T3FREE, THYROIDAB in the last 72 hours. Anemia Panel: No results for input(s): VITAMINB12, FOLATE, FERRITIN, TIBC, IRON, RETICCTPCT in the last 72 hours. Urine analysis:    Component Value Date/Time   COLORURINE YELLOW 07/06/2017 2155   APPEARANCEUR CLEAR 07/06/2017 2155   LABSPEC 1.017 07/06/2017 2155   PHURINE 5.0 07/06/2017 2155   GLUCOSEU NEGATIVE 07/06/2017 2155   HGBUR MODERATE (A) 07/06/2017 2155   BILIRUBINUR NEGATIVE 07/06/2017 2155   BILIRUBINUR neg 04/27/2014 1205   KETONESUR NEGATIVE 07/06/2017 2155   PROTEINUR 30 (A) 07/06/2017 2155   UROBILINOGEN 0.2 03/07/2015 1005   NITRITE NEGATIVE 07/06/2017 2155   LEUKOCYTESUR NEGATIVE 07/06/2017 2155   Sepsis Labs: Invalid input(s): PROCALCITONIN, LACTICIDVEN  No results found for this or any previous visit (from the past 240 hour(s)).    Radiology Studies: Ct Abdomen Pelvis W Contrast  Result Date: 07/07/2017 CLINICAL DATA:  Acute onset of diarrhea. Dehydration. Rectal bleeding. EXAM: CT ABDOMEN AND PELVIS WITH CONTRAST TECHNIQUE: Multidetector CT imaging of the abdomen and pelvis was performed using the standard protocol following bolus administration of intravenous contrast. CONTRAST:  <See Chart>  ISOVUE-300 IOPAMIDOL (ISOVUE-300) INJECTION 61% COMPARISON:  CT of the abdomen and pelvis performed 03/05/2017, and renal ultrasound performed 04/10/2017 FINDINGS: Lower chest: Scattered coronary artery calcifications are seen. The patient is status post median sternotomy. The visualized lung bases are grossly clear. Hepatobiliary: The liver is unremarkable in appearance. The gallbladder is unremarkable in appearance. The common bile duct remains normal in caliber. Pancreas: The pancreas is within normal limits. Spleen: The spleen is unremarkable in appearance. Adrenals/Urinary Tract: The adrenal glands are unremarkable in appearance. The kidneys are within normal limits. There is no evidence of hydronephrosis. No renal or ureteral stones are identified. Nonspecific perinephric stranding is noted bilaterally. Stomach/Bowel: The ileocolonic anastomosis at the upper pelvis is grossly unremarkable in appearance. The patient is status post resection of much of the colon. Remaining small bowel is unremarkable. The stomach is within normal limits. The distal sigmoid colon and rectum are unremarkable in appearance. There is no obvious  source for the patient's rectal bleeding. Vascular/Lymphatic: Scattered calcification is seen along the abdominal aorta and its branches. The abdominal aorta is otherwise grossly unremarkable. The inferior vena cava is grossly unremarkable. No retroperitoneal lymphadenopathy is seen. No pelvic sidewall lymphadenopathy is identified. Reproductive: The bladder is mildly distended and grossly unremarkable. The prostate remains normal in size. A penile implant reservoir is noted at the right hemipelvis. Other: No additional soft tissue abnormalities are seen. Musculoskeletal: No acute osseous abnormalities are identified. The patient is status post lumbosacral spinal fusion at L5-S1. The visualized musculature is unremarkable in appearance. IMPRESSION: 1. No acute abnormality seen to explain the  patient's symptoms. 2. Ileocolic anastomosis is unremarkable in appearance. 3. Scattered coronary artery calcification. 4.  Aortic Atherosclerosis (ICD10-I70.0). Electronically Signed   By: Garald Balding M.D.   On: 07/07/2017 00:04     Scheduled Meds: . ARIPiprazole  5 mg Oral Daily  . aspirin  325 mg Oral Daily  . atorvastatin  80 mg Oral QHS  . donepezil  10 mg Oral Daily  . FLUoxetine  20 mg Oral q morning - 10a  . fluticasone  2 spray Each Nare Daily  . furosemide  20 mg Oral Daily  . lamoTRIgine  100 mg Oral BID  . multivitamin with minerals  1 tablet Oral Daily  . pantoprazole (PROTONIX) IV  40 mg Intravenous Q12H  . potassium chloride  10 mEq Oral BID  . traZODone  100 mg Oral QHS   Continuous Infusions: . sodium chloride 75 mL/hr at 07/07/17 0227  . ciprofloxacin Stopped (07/07/17 1234)  . metronidazole Stopped (07/07/17 0706)       Time spent: 15 minutes    Audery Amel, PA-S 07/07/2017, 12:41 PM   @[email or web address] @

## 2017-07-07 NOTE — Evaluation (Signed)
Physical Therapy Evaluation Patient Details Name: [name] MRN: [id number] DOB: 14-Nov-1945 Today's Date: 07/07/2017   History of Present Illness  71 yo male admitted with rectal bleeding. Hx of CVA with L hemiparesis, back pain, A flutter, DM, SZ, CAD, colectomy.   Clinical Impression  On eval, pt required Min assist for mobility. He walked ~60 feet with a RW. Pt c/o moderate back pain which is chronic by his report. Pt presents with general weakness, decreased activity tolerance, and impaired gait and balance. No family present during session. Recommend HHPT and 24 hour supervision/assist. Will follow and progress activity as tolerated.     Follow Up Recommendations Home health PT;Supervision/Assistance - 24 hour    Equipment Recommendations  None recommended by PT    Recommendations for Other Services       Precautions / Restrictions Precautions Precautions: Fall Precaution Comments: O2 at baseline Restrictions Weight Bearing Restrictions: No      Mobility  Bed Mobility Overal bed mobility: Needs Assistance Bed Mobility: Supine to Sit     Supine to sit: Min assist;HOB elevated     General bed mobility comments: small amount of assist for L LE and to scoot to EOB. Increased time. Pt relied on bedrail   Transfers Overall transfer level: Needs assistance Equipment used: Rolling walker (2 wheeled) Transfers: Sit to/from Stand Sit to Stand: Min guard         General transfer comment: close guard for safety. VCs hand placement   Ambulation/Gait Ambulation/Gait assistance: Min guard Ambulation Distance (Feet): 60 Feet Assistive device: Rolling walker (2 wheeled) Gait Pattern/deviations: Step-through pattern;Decreased stride length     General Gait Details: close guard for safety. varying gait speed. Remained on [place] O2  Stairs            Wheelchair Mobility    Modified Rankin (Stroke Patients Only)       Balance                                              Pertinent Vitals/Pain Pain Assessment: Faces Faces Pain Scale: Hurts little more Pain Location: back Pain Descriptors / Indicators: Sore;Aching Pain Intervention(s): Monitored during session;Repositioned    Home Living Family/patient expects to be discharged to:: Private residence Living Arrangements: Spouse/significant other Available Help at Discharge: Family;Available 24 hours/day;Personal care attendant Type of Home: House Home Access: Ramped entrance     Home Layout: Two level;Bed/bath upstairs Home Equipment: Shower seat;Grab bars - tub/shower;Hand held shower head;Walker - 4 wheels;Walker - 2 wheels;Adaptive equipment Additional Comments: has an aide 3 hours a day for showers and LB dressing    Prior Function Level of Independence: Needs assistance   Gait / Transfers Assistance Needed: no device in the home, uses walker outside           Hand Dominance        Extremity/Trunk Assessment   Upper Extremity Assessment Upper Extremity Assessment: Generalized weakness(residual L hemiparesis from CVA)    Lower Extremity Assessment Lower Extremity Assessment: Generalized weakness(residual L hemiparesis from CVA)    Cervical / Trunk Assessment Cervical / Trunk Assessment: Kyphotic  Communication   Communication: HOH  Cognition Arousal/Alertness: Awake/alert Behavior During Therapy: WFL for tasks assessed/performed Overall Cognitive Status: Within Functional Limits for tasks assessed  General Comments      Exercises     Assessment/Plan    PT Assessment Patient needs continued PT services  PT Problem List Decreased strength;Decreased mobility;Decreased activity tolerance;Decreased balance;Pain       PT Treatment Interventions DME instruction;Gait training;Functional mobility training;Therapeutic activities;Balance training;Therapeutic exercise;Patient/family education     PT Goals (Current goals can be found in the Care Plan section)  Acute Rehab PT Goals Patient Stated Goal: home PT Goal Formulation: With patient Time For Goal Achievement: 07/21/17 Potential to Achieve Goals: Good    Frequency Min 3X/week   Barriers to discharge        Co-evaluation               AM-PAC PT "6 Clicks" Daily Activity  Outcome Measure Difficulty turning over in bed (including adjusting bedclothes, sheets and blankets)?: A Little Difficulty moving from lying on back to sitting on the side of the bed? : Unable Difficulty sitting down on and standing up from a chair with arms (e.g., wheelchair, bedside commode, etc,.)?: A Little Help needed moving to and from a bed to chair (including a wheelchair)?: A Little Help needed walking in hospital room?: A Little Help needed climbing 3-5 steps with a railing? : A Little 6 Click Score: 16    End of Session Equipment Utilized During Treatment: Gait belt Activity Tolerance: Patient tolerated treatment well Patient left: in chair;with call bell/phone within reach(chair alarm pad in place;no alarm box in room. Made RN aware. )   PT Visit Diagnosis: Muscle weakness (generalized) (M62.81);Difficulty in walking, not elsewhere classified (R26.2)    Time: 2548-6282 PT Time Calculation (min) (ACUTE ONLY): 23 min   Charges:   PT Evaluation $PT Eval Moderate Complexity: 1 Mod PT Treatments $Gait Training: 8-22 mins   PT G Codes:         Weston Anna, MPT Pager: [phone]

## 2017-07-07 NOTE — Progress Notes (Signed)
Pharmacy Antibiotic Note  [name] is a 71 y.o. male admitted on 07/06/2017 with Intra-abdominal infection.  Pharmacy has been consulted for Ciprofloxacin dosing.  Plan: Ciprofloxacin 417m iv q12hr  Height: 5' 6"  (167.6 cm) Weight: 207 lb (93.9 kg) IBW/kg (Calculated) : 63.8  Temp (24hrs), Avg:98.2 F (36.8 C), Min:97.8 F (36.6 C), Max:98.5 F (36.9 C)  Recent Labs  Lab 07/06/17 2155 07/06/17 2207  WBC 12.7*  --   CREATININE 0.84 0.80    Estimated Creatinine Clearance: 88.2 mL/min (by C-G formula based on SCr of 0.8 mg/dL).    Allergies  Allergen Reactions  . Cephalexin     Unknown  . Doxycycline     Unknown  . Keflex [Cephalexin]   . Lac Bovis Other (See Comments)    lactose intolerant  . Pentazocine Lactate     He passed out- he had a seizure.  This occurred around 2000  . Talwin [Pentazocine] Other (See Comments)    hallucinations  . Tape Other (See Comments)    Paper tape only please.  . Zanaflex [Tizanidine Hcl] Other (See Comments)    Lightheaded and dizzy    Antimicrobials this admission: Ciprofloxacin 07/07/2017 >> Flagyl 07/07/2017 >>  Dose adjustments this admission: -  Microbiology results: pending  Thank you for allowing pharmacy to be a part of this patient's care.  GNani SkillernCrowford 07/07/2017 2:18 AM

## 2017-07-07 NOTE — Care Management Note (Signed)
Case Management Note  Patient Details  Name: DAREON NUNZIATO MRN: [id number] Date of Birth: 09-19-1945  Subjective/Objective:                  Rectal bleeding  Action/Plan: Date:  July 07, 2017 Chart reviewed for concurrent status and case management needs.  Will continue to follow patient progress.  Discharge Planning: following for needs  Expected discharge date: July 10, 2017  Velva Harman, BSN, [place], [place]   Expected Discharge Date:                  Expected Discharge Plan:  Home/Self Care  In-House Referral:     Discharge planning Services  CM Consult  Post Acute Care Choice:    Choice offered to:     DME Arranged:    DME Agency:     HH Arranged:    HH Agency:     Status of Service:  In process, will continue to follow  If discussed at Long Length of Stay Meetings, dates discussed:    Additional Comments:  Leeroy Cha, RN 07/07/2017, 9:36 AM

## 2017-07-07 NOTE — Telephone Encounter (Signed)
Patient no show today for appt.He is in hospital today.

## 2017-07-07 NOTE — Consult Note (Signed)
Reason for Consult: GI bleed Referring Physician: Hospital team  [name] is an 71 y.o. male.  HPI: Patient known to my partner Dr. [name] with an endoscopy 3 years ago and a flex sig a little longer than that who is on blood thinners at home and has seen bright red blood for 2 months without any change in bowel habits and is not sure of the names of his medicines nor whether he takes extra aspirin or nonsteroidals at home and is usually followed at the [place] but they have not done any GI workup there and he has not had any pain or vomiting and his colonic surgery with secondary to diverticuli and currently he is tolerating clear liquids and has not had any further bleeding here and he has no other complaints and his CT was okay in his hospital computer chart and our office computer chart was reviewed  Past Medical History:  Diagnosis Date  . AKI (acute kidney injury) ([place])    a. 6/14: resolved after d/c ARB;  b. RA U/S 7/14: no RA stenosis  . Arthritis    of spine  . Atrial flutter ([place])   . Back pain    persistent  . Coronary artery disease    a. Low level exercise Lex MV 3/14: low risk, EF 62%, inf defect 2/2 diaph attenuation vs artifiact, small area of scar possible, no ischemia  . Depression   . Diabetes mellitus    controled by diet  . Diverticulitis   . GERD (gastroesophageal reflux disease)   . H/O alcohol abuse   . Headache(784.0)   . Hearing loss   . History of atrial flutter   . History of hiatal hernia   . History of kidney stones   . Hx of echocardiogram    Echo 7/14:  Mod LVH, EF 55-60%, Gr 1 DD, mild MR, PASP 36  . Hyperlipidemia   . Hypertension   . Renal stone 04/15/2014  . Seizures ([place])   . Shortness of breath   . Sleep apnea    does not use CPAP  . Stroke [facility])     Past Surgical History:  Procedure Laterality Date  . APPENDECTOMY    . BACK SURGERY    . CARDIAC CATHETERIZATION    . CERVICAL FUSION    . CORONARY ARTERY BYPASS GRAFT  2005   LIMA  graft to LAD,saphenous vein graft to diag.,circumflex, marginal,and to the RCA  . ORCHIECTOMY    . PARTIAL COLECTOMY     for diverticuli  . UMBILICAL HERNIA REPAIR      Family History  Problem Relation Age of Onset  . Emphysema Mother        was a smoker  . Asthma Mother   . Hypertension Mother   . Heart disease Father   . Prostate cancer Paternal Grandfather   . Pancreatic cancer Paternal Uncle   . Stroke Paternal Grandmother   . Heart attack Neg Hx     Social History:  reports that  has never smoked. he has never used smokeless tobacco. He reports that he does not drink alcohol or use drugs.  Allergies:  Allergies  Allergen Reactions  . Cephalexin     Unknown  . Doxycycline     Unknown  . Keflex [Cephalexin]   . Lac Bovis Other (See Comments)    lactose intolerant  . Pentazocine Lactate     He passed out- he had a seizure.  This occurred around 2000  .  Talwin [Pentazocine] Other (See Comments)    hallucinations  . Tape Other (See Comments)    Paper tape only please.  . Zanaflex [Tizanidine Hcl] Other (See Comments)    Lightheaded and dizzy    Medications: I have reviewed the patient's current medications.  Results for orders placed or performed during the hospital encounter of 07/06/17 (from the past 48 hour(s))  Occult blood, poc device     Status: Abnormal   Collection Time: 07/06/17  9:51 PM  Result Value Ref Range   Fecal Occult Bld POSITIVE (A) NEGATIVE  Lipase, blood     Status: None   Collection Time: 07/06/17  9:55 PM  Result Value Ref Range   Lipase 28 11 - 51 U/L  Comprehensive metabolic panel     Status: Abnormal   Collection Time: 07/06/17  9:55 PM  Result Value Ref Range   Sodium 137 135 - 145 mmol/L   Potassium 3.3 (L) 3.5 - 5.1 mmol/L   Chloride 99 (L) 101 - 111 mmol/L   CO2 26 22 - 32 mmol/L   Glucose, Bld 163 (H) 65 - 99 mg/dL   BUN 10 6 - 20 mg/dL   Creatinine, Ser 0.84 0.61 - 1.24 mg/dL   Calcium 8.9 8.9 - 10.3 mg/dL   Total Protein  7.8 6.5 - 8.1 g/dL   Albumin 3.6 3.5 - 5.0 g/dL   AST 23 15 - 41 U/L   ALT 18 17 - 63 U/L   Alkaline Phosphatase 104 38 - 126 U/L   Total Bilirubin 0.4 0.3 - 1.2 mg/dL   GFR calc non Af Amer >60 >60 mL/min   GFR calc Af Amer >60 >60 mL/min    Comment: (NOTE) The eGFR has been calculated using the CKD EPI equation. This calculation has not been validated in all clinical situations. eGFR's persistently <60 mL/min signify possible Chronic Kidney Disease.    Anion gap 12 5 - 15  CBC     Status: Abnormal   Collection Time: 07/06/17  9:55 PM  Result Value Ref Range   WBC 12.7 (H) 4.0 - 10.5 K/uL   RBC 3.73 (L) 4.22 - 5.81 MIL/uL   Hemoglobin 11.0 (L) 13.0 - 17.0 g/dL   HCT 34.4 (L) 39.0 - 52.0 %   MCV 92.2 78.0 - 100.0 fL   MCH 29.5 26.0 - 34.0 pg   MCHC 32.0 30.0 - 36.0 g/dL   RDW 13.7 11.5 - 15.5 %   Platelets 240 150 - 400 K/uL  Urinalysis, Routine w reflex microscopic     Status: Abnormal   Collection Time: 07/06/17  9:55 PM  Result Value Ref Range   Color, Urine YELLOW YELLOW   APPearance CLEAR CLEAR   Specific Gravity, Urine 1.017 1.005 - 1.030   pH 5.0 5.0 - 8.0   Glucose, UA NEGATIVE NEGATIVE mg/dL   Hgb urine dipstick MODERATE (A) NEGATIVE   Bilirubin Urine NEGATIVE NEGATIVE   Ketones, ur NEGATIVE NEGATIVE mg/dL   Protein, ur 30 (A) NEGATIVE mg/dL   Nitrite NEGATIVE NEGATIVE   Leukocytes, UA NEGATIVE NEGATIVE   RBC / HPF 6-30 0 - 5 RBC/hpf   WBC, UA 0-5 0 - 5 WBC/hpf   Bacteria, UA NONE SEEN NONE SEEN   Squamous Epithelial / LPF NONE SEEN NONE SEEN   Mucus PRESENT   I-stat chem 8, ed     Status: Abnormal   Collection Time: 07/06/17 10:07 PM  Result Value Ref Range   Sodium 138 135 -  145 mmol/L   Potassium 3.3 (L) 3.5 - 5.1 mmol/L   Chloride 99 (L) 101 - 111 mmol/L   BUN 8 6 - 20 mg/dL   Creatinine, Ser 0.80 0.61 - 1.24 mg/dL   Glucose, Bld 164 (H) 65 - 99 mg/dL   Calcium, Ion 1.11 (L) 1.15 - 1.40 mmol/L   TCO2 28 22 - 32 mmol/L   Hemoglobin 11.6 (L) 13.0 -  17.0 g/dL   HCT 34.0 (L) 39.0 - 52.0 %  Glucose, capillary     Status: None   Collection Time: 07/07/17  2:00 AM  Result Value Ref Range   Glucose-Capillary 95 65 - 99 mg/dL  CBC     Status: Abnormal   Collection Time: 07/07/17  3:15 AM  Result Value Ref Range   WBC 11.4 (H) 4.0 - 10.5 K/uL   RBC 3.72 (L) 4.22 - 5.81 MIL/uL   Hemoglobin 10.9 (L) 13.0 - 17.0 g/dL   HCT 34.3 (L) 39.0 - 52.0 %   MCV 92.2 78.0 - 100.0 fL   MCH 29.3 26.0 - 34.0 pg   MCHC 31.8 30.0 - 36.0 g/dL   RDW 13.8 11.5 - 15.5 %   Platelets 220 150 - 400 K/uL  Basic metabolic panel     Status: Abnormal   Collection Time: 07/07/17  7:03 AM  Result Value Ref Range   Sodium 138 135 - 145 mmol/L   Potassium 3.6 3.5 - 5.1 mmol/L   Chloride 103 101 - 111 mmol/L   CO2 26 22 - 32 mmol/L   Glucose, Bld 114 (H) 65 - 99 mg/dL   BUN 8 6 - 20 mg/dL   Creatinine, Ser 0.71 0.61 - 1.24 mg/dL   Calcium 8.5 (L) 8.9 - 10.3 mg/dL   GFR calc non Af Amer >60 >60 mL/min   GFR calc Af Amer >60 >60 mL/min    Comment: (NOTE) The eGFR has been calculated using the CKD EPI equation. This calculation has not been validated in all clinical situations. eGFR's persistently <60 mL/min signify possible Chronic Kidney Disease.    Anion gap 9 5 - 15  CBC     Status: Abnormal   Collection Time: 07/07/17  7:03 AM  Result Value Ref Range   WBC 9.2 4.0 - 10.5 K/uL   RBC 3.37 (L) 4.22 - 5.81 MIL/uL   Hemoglobin 10.1 (L) 13.0 - 17.0 g/dL   HCT 31.0 (L) 39.0 - 52.0 %   MCV 92.0 78.0 - 100.0 fL   MCH 30.0 26.0 - 34.0 pg   MCHC 32.6 30.0 - 36.0 g/dL   RDW 13.9 11.5 - 15.5 %   Platelets 217 150 - 400 K/uL  CBC     Status: Abnormal   Collection Time: 07/07/17 10:43 AM  Result Value Ref Range   WBC 8.0 4.0 - 10.5 K/uL   RBC 3.37 (L) 4.22 - 5.81 MIL/uL   Hemoglobin 10.1 (L) 13.0 - 17.0 g/dL   HCT 31.2 (L) 39.0 - 52.0 %   MCV 92.6 78.0 - 100.0 fL   MCH 30.0 26.0 - 34.0 pg   MCHC 32.4 30.0 - 36.0 g/dL   RDW 13.7 11.5 - 15.5 %   Platelets  216 150 - 400 K/uL    Ct Abdomen Pelvis W Contrast  Result Date: 07/07/2017 CLINICAL DATA:  Acute onset of diarrhea. Dehydration. Rectal bleeding. EXAM: CT ABDOMEN AND PELVIS WITH CONTRAST TECHNIQUE: Multidetector CT imaging of the abdomen and pelvis was performed using the standard protocol  following bolus administration of intravenous contrast. CONTRAST:  <See Chart> ISOVUE-300 IOPAMIDOL (ISOVUE-300) INJECTION 61% COMPARISON:  CT of the abdomen and pelvis performed 03/05/2017, and renal ultrasound performed 04/10/2017 FINDINGS: Lower chest: Scattered coronary artery calcifications are seen. The patient is status post median sternotomy. The visualized lung bases are grossly clear. Hepatobiliary: The liver is unremarkable in appearance. The gallbladder is unremarkable in appearance. The common bile duct remains normal in caliber. Pancreas: The pancreas is within normal limits. Spleen: The spleen is unremarkable in appearance. Adrenals/Urinary Tract: The adrenal glands are unremarkable in appearance. The kidneys are within normal limits. There is no evidence of hydronephrosis. No renal or ureteral stones are identified. Nonspecific perinephric stranding is noted bilaterally. Stomach/Bowel: The ileocolonic anastomosis at the upper pelvis is grossly unremarkable in appearance. The patient is status post resection of much of the colon. Remaining small bowel is unremarkable. The stomach is within normal limits. The distal sigmoid colon and rectum are unremarkable in appearance. There is no obvious source for the patient's rectal bleeding. Vascular/Lymphatic: Scattered calcification is seen along the abdominal aorta and its branches. The abdominal aorta is otherwise grossly unremarkable. The inferior vena cava is grossly unremarkable. No retroperitoneal lymphadenopathy is seen. No pelvic sidewall lymphadenopathy is identified. Reproductive: The bladder is mildly distended and grossly unremarkable. The prostate  remains normal in size. A penile implant reservoir is noted at the right hemipelvis. Other: No additional soft tissue abnormalities are seen. Musculoskeletal: No acute osseous abnormalities are identified. The patient is status post lumbosacral spinal fusion at L5-S1. The visualized musculature is unremarkable in appearance. IMPRESSION: 1. No acute abnormality seen to explain the patient's symptoms. 2. Ileocolic anastomosis is unremarkable in appearance. 3. Scattered coronary artery calcification. 4.  Aortic Atherosclerosis (ICD10-I70.0). Electronically Signed   By: Garald Balding M.D.   On: 07/07/2017 00:04    ROS hard of hearing otherwise no acute distress no other complaints Blood pressure (!) 109/55, pulse 72, temperature 97.9 F (36.6 C), temperature source Oral, resp. rate 20, height 5' 6" (1.676 m), weight 93.9 kg (207 lb), SpO2 98 %. Physical Exam vital signs stable afebrile no acute distress abdomen is soft nontender labs and CT reviewed Assessment/Plan: Bright red blood per rectum in a patient on blood thinners Plan: Agree with clear liquids follow stools and observe for now and might consider repeat flex sig and possible endoscopy depending on clinical course or possibly could proceed with outpatient workup if no further GI bleeding and will check on tomorrow  [facility] 07/07/2017, 12:45 PM

## 2017-07-08 DIAGNOSIS — E1159 Type 2 diabetes mellitus with other circulatory complications: Secondary | ICD-10-CM

## 2017-07-08 DIAGNOSIS — D500 Iron deficiency anemia secondary to blood loss (chronic): Secondary | ICD-10-CM

## 2017-07-08 LAB — GASTROINTESTINAL PANEL BY PCR, STOOL (REPLACES STOOL CULTURE)
ADENOVIRUS F40/41: NOT DETECTED
Astrovirus: NOT DETECTED
CAMPYLOBACTER SPECIES: NOT DETECTED
CRYPTOSPORIDIUM: NOT DETECTED
CYCLOSPORA CAYETANENSIS: NOT DETECTED
ENTEROPATHOGENIC E COLI (EPEC): NOT DETECTED
ENTEROTOXIGENIC E COLI (ETEC): NOT DETECTED
Entamoeba histolytica: NOT DETECTED
Enteroaggregative E coli (EAEC): NOT DETECTED
Giardia lamblia: NOT DETECTED
Norovirus GI/GII: NOT DETECTED
PLESIMONAS SHIGELLOIDES: NOT DETECTED
ROTAVIRUS A: NOT DETECTED
SAPOVIRUS (I, II, IV, AND V): NOT DETECTED
SHIGA LIKE TOXIN PRODUCING E COLI (STEC): NOT DETECTED
Salmonella species: NOT DETECTED
Shigella/Enteroinvasive E coli (EIEC): NOT DETECTED
Vibrio cholerae: NOT DETECTED
Vibrio species: NOT DETECTED
YERSINIA ENTEROCOLITICA: NOT DETECTED

## 2017-07-08 LAB — BASIC METABOLIC PANEL
ANION GAP: 7 (ref 5–15)
BUN: 6 mg/dL (ref 6–20)
CHLORIDE: 106 mmol/L (ref 101–111)
CO2: 27 mmol/L (ref 22–32)
Calcium: 8.8 mg/dL — ABNORMAL LOW (ref 8.9–10.3)
Creatinine, Ser: 0.87 mg/dL (ref 0.61–1.24)
Glucose, Bld: 124 mg/dL — ABNORMAL HIGH (ref 65–99)
POTASSIUM: 4.3 mmol/L (ref 3.5–5.1)
SODIUM: 140 mmol/L (ref 135–145)

## 2017-07-08 LAB — CBC
HCT: 34.2 % — ABNORMAL LOW (ref 39.0–52.0)
HEMOGLOBIN: 10.8 g/dL — AB (ref 13.0–17.0)
MCH: 29.3 pg (ref 26.0–34.0)
MCHC: 31.6 g/dL (ref 30.0–36.0)
MCV: 92.9 fL (ref 78.0–100.0)
PLATELETS: 233 10*3/uL (ref 150–400)
RBC: 3.68 MIL/uL — AB (ref 4.22–5.81)
RDW: 13.8 % (ref 11.5–15.5)
WBC: 9.3 10*3/uL (ref 4.0–10.5)

## 2017-07-08 LAB — GLUCOSE, CAPILLARY
Glucose-Capillary: 123 mg/dL — ABNORMAL HIGH (ref 65–99)
Glucose-Capillary: 139 mg/dL — ABNORMAL HIGH (ref 65–99)

## 2017-07-08 MED ORDER — OMEPRAZOLE 40 MG PO CPDR: 20.0000 mg | DELAYED_RELEASE_CAPSULE | Freq: Every day | ORAL | Status: DC

## 2017-07-08 MED ORDER — IPRATROPIUM-ALBUTEROL 0.5-2.5 (3) MG/3ML IN SOLN: 3.0000 mL | Freq: Four times a day (QID) | RESPIRATORY_TRACT | Status: DC | PRN

## 2017-07-08 NOTE — Progress Notes (Signed)
[name] 2:19 PM  Subjective: Patient without any signs of bleeding and his case discussed with the hospital Dr. as well as his daughter and we discussed his diarrhea at home and Questran probably helped in the past and he has no other complaints  Objective: Vital signs stable afebrile no acute distress abdomen is soft nontender hemoglobin actually increased BUN okay  Assessment: Seemingly resolved bright red blood per rectum in a patient with chronic diarrhea secondary to previous surgery  Plan: Either myself or his primary gastroenterologist Dr. [name] will see him back in a week or 2 and we might retry either Questran or Colestid for his diarrhea and the warnings have not within an hour of other medicines were discussed and he might benefit from an outpatient flexible sigmoidoscopy particularly if bright red blood continues  [name]  Pager [phone] After 5PM or if no answer call [phone]

## 2017-07-08 NOTE — Progress Notes (Signed)
Discharge instructions and medications discussed with patient and wife.  AVS given to wife.  All questions answered.

## 2017-07-08 NOTE — Discharge Summary (Addendum)
Physician Discharge Summary  [name] CBJ:[id number] DOB: [birth year] DOA: 07/06/2017  PCP: Bernell List, MD  Admit date: 07/06/2017 Discharge date: 07/08/2017  Admitted From:home Disposition:home   Recommendations for Outpatient Follow-up:  1. Follow up with PCP in 1-2 weeks 2. Please obtain BMP/CBC in one week   Home Health:yes Equipment/Devices:none Discharge Condition:stable CODE STATUS:full code Diet recommendation:heart healthy  Brief/Interim Summary: 71 year old gentleman with history of paroxysmal atrial fibrillation, a stroke on chronic aspirin, diverticulitis status post colectomy in 2006, type 2 diabetes, hypertension, coronary artery disease, presented with blood per rectum and loose bowel movement for about a week.  Patient had a bowel movement today which was regular with no blood.  Patient with no nausea vomiting or abdominal pain.  Hemoglobin is stable.  No sign of active bleeding.  Evaluated by GI.  I have discussed with Dr. Watt Climes from GI who agreed with the plan including continuing aspirin and outpatient follow-up with Dr. [name] for further evaluation.  Continue home medication..  CT scan of abdomen pelvis with no sign of diverticulitis or infection therefore no need for antibiotics.  Stool C. difficile negative.  Patient may need a colonoscopy or sigmoidoscopy as outpatient.  Stable on discharge.  Home care services ordered.  He said he lives with his wife.  He has chronic respiratory failure with hypoxia on 3 L of oxygen which is a stable here.  I don't think patient has infectious diarrhea.  Discharge Diagnoses:  Active Problems:   Rectal bleeding   Anemia    Discharge Instructions  Discharge Instructions    Call MD for:  difficulty breathing, headache or visual disturbances   Complete by:  As directed    Call MD for:  extreme fatigue   Complete by:  As directed    Call MD for:  hives   Complete by:  As directed    Call MD for:  persistant  dizziness or light-headedness   Complete by:  As directed    Call MD for:  persistant nausea and vomiting   Complete by:  As directed    Call MD for:  severe uncontrolled pain   Complete by:  As directed    Call MD for:  temperature >100.4   Complete by:  As directed    Diet Carb Modified   Complete by:  As directed    Discharge instructions   Complete by:  As directed    Please follow up with GI in 1-2 weeks.   Increase activity slowly   Complete by:  As directed      Allergies as of 07/08/2017      Reactions   Cephalexin    Unknown   Doxycycline    Unknown   Keflex [cephalexin]    Lac Bovis Other (See Comments)   lactose intolerant   Pentazocine Lactate    He passed out- he had a seizure.  This occurred around 2000   Talwin [pentazocine] Other (See Comments)   hallucinations   Tape Other (See Comments)   Paper tape only please.   Zanaflex [tizanidine Hcl] Other (See Comments)   Lightheaded and dizzy      Medication List    STOP taking these medications   HYDROcodone-acetaminophen 5-325 MG tablet Commonly known as:  NORCO   meclizine 12.5 MG tablet Commonly known as:  ANTIVERT     TAKE these medications   acetaminophen 500 MG tablet Commonly known as:  TYLENOL Take 500 mg by mouth every 6 (six) hours as  needed.   ARIPiprazole 5 MG tablet Commonly known as:  ABILIFY Take 5 mg by mouth daily.   aspirin 325 MG EC tablet Take 325 mg daily by mouth. What changed:  Another medication with the same name was removed. Continue taking this medication, and follow the directions you see here.   atorvastatin 80 MG tablet Commonly known as:  LIPITOR Take 1 tablet (80 mg total) by mouth daily. What changed:  when to take this   diphenoxylate-atropine 2.5-0.025 MG tablet Commonly known as:  LOMOTIL Take 1 tablet 2 (two) times daily as needed by mouth for diarrhea or loose stools.   donepezil 10 MG disintegrating tablet Commonly known as:  ARICEPT ODT Take 10 mg  by mouth daily.   FLUoxetine 10 MG capsule Commonly known as:  PROZAC Take 20 mg every morning by mouth.   fluticasone 50 MCG/ACT nasal spray Commonly known as:  FLONASE Place 2 sprays into both nostrils daily.   furosemide 20 MG tablet Commonly known as:  LASIX TAKE 1-2 TABLETS BY MOUTH EVERY DAY AS NEEDED What changed:  See the new instructions.   glipiZIDE 5 MG tablet Commonly known as:  GLUCOTROL Take 2.5 mg by mouth 2 (two) times daily before a meal.   hydroxypropyl methylcellulose / hypromellose 2.5 % ophthalmic solution Commonly known as:  ISOPTO TEARS / GONIOVISC Place 1 drop into both eyes as needed for dry eyes.   lamoTRIgine 100 MG tablet Commonly known as:  LAMICTAL Take 1 tablet (100 mg total) by mouth 2 (two) times daily.   loperamide 2 MG capsule Commonly known as:  IMODIUM Take 2 mg by mouth as needed for diarrhea or loose stools.   multivitamin capsule Take 1 capsule by mouth daily.   nitroGLYCERIN 0.4 MG SL tablet Commonly known as:  NITROSTAT Place 1 tablet (0.4 mg total) under the tongue every 5 (five) minutes as needed. For chest pain   omeprazole 40 MG capsule Commonly known as:  PRILOSEC Take 1 capsule (40 mg total) daily by mouth. What changed:  when to take this   ONE TOUCH ULTRA TEST test strip Generic drug:  glucose blood USE TO TEST BLOOD SUGAR DAILY   potassium citrate 10 MEQ (1080 MG) SR tablet Commonly known as:  UROCIT-K Take 10 mEq 2 (two) times daily by mouth.   sitaGLIPtin 100 MG tablet Commonly known as:  JANUVIA Take 100 mg by mouth every morning. Reported on 09/19/2015   traZODone 100 MG tablet Commonly known as:  DESYREL Take 100 mg by mouth at bedtime.   triamcinolone 0.1 % cream : eucerin Crea Apply 1 application topically daily. TRIAMCINOLONE 1%/ Absorbase      Follow-up Information    Bernell List, MD. Schedule an appointment as soon as possible for a visit in 1 week(s).   Specialty:  Internal  Medicine Contact information: [place] [address] [phone]        Laurence Spates, MD. Schedule an appointment as soon as possible for a visit in 2 week(s).   Specialty:  Gastroenterology Contact information: [address] [place] [address] [phone]          Allergies  Allergen Reactions  . Cephalexin     Unknown  . Doxycycline     Unknown  . Keflex [Cephalexin]   . Lac Bovis Other (See Comments)    lactose intolerant  . Pentazocine Lactate     He passed out- he had a seizure.  This occurred around 2000  . Talwin [Pentazocine] Other (See Comments)    hallucinations  . Tape Other (See Comments)    Paper tape only please.  . Zanaflex [Tizanidine Hcl] Other (See Comments)    Lightheaded and dizzy    Consultations: GI  Procedures/Studies: None  Subjective: Seen and examined at bedside.  Denies headache, dizziness, nausea vomiting chest pain shortness of breath.  Had one bowel movement today which was normal.  No blood.  Discharge Exam: Vitals:   07/07/17 2236 07/08/17 0517  BP:  (!) 144/60  Pulse:  66  Resp: 20 20  Temp:  98.2 F (36.8 C)  SpO2:  95%   Vitals:   07/07/17 1437 07/07/17 2123 07/07/17 2236 07/08/17 0517  BP: (!) 113/55 115/64  (!) 144/60  Pulse: 70 68  66  Resp: 20 20 20 20   Temp: 97.9 F (36.6 C) 97.9 F (36.6 C)  98.2 F (36.8 C)  TempSrc: Oral Oral  Oral  SpO2: 97% 97%  95%  Weight:      Height:        General: Pt is alert, awake, not in acute distress Cardiovascular: RRR, S1/S2 +, no rubs, no gallops Respiratory: CTA bilaterally, no wheezing, no rhonchi Abdominal: Soft, NT, ND, bowel sounds + Extremities: no edema, no cyanosis    The results of significant diagnostics from this hospitalization (including imaging, microbiology, ancillary and laboratory) are listed below for reference.     Microbiology: Recent Results (from the past 240 hour(s))  C difficile quick scan w  PCR reflex     Status: None   Collection Time: 07/07/17  4:57 PM  Result Value Ref Range Status   C Diff antigen NEGATIVE NEGATIVE Final   C Diff toxin NEGATIVE NEGATIVE Final   C Diff interpretation No C. difficile detected.  Final     Labs: BNP (last 3 results) No results for input(s): BNP in the last 8760 hours. Basic Metabolic Panel: Recent Labs  Lab 07/06/17 2155 07/06/17 2207 07/07/17 0703 07/08/17 0518  NA 137 138 138 140  K 3.3* 3.3* 3.6 4.3  CL 99* 99* 103 106  CO2 26  --  26 27  GLUCOSE 163* 164* 114* 124*  BUN 10 8 8 6   CREATININE 0.84 0.80 0.71 0.87  CALCIUM 8.9  --  8.5* 8.8*   Liver Function Tests: Recent Labs  Lab 07/06/17 2155  AST 23  ALT 18  ALKPHOS 104  BILITOT 0.4  PROT 7.8  ALBUMIN 3.6   Recent Labs  Lab 07/06/17 2155  LIPASE 28   No results for input(s): AMMONIA in the last 168 hours. CBC: Recent Labs  Lab 07/06/17 2155 07/06/17 2207 07/07/17 0315 07/07/17 0703 07/07/17 1043 07/08/17 0518  WBC 12.7*  --  11.4* 9.2 8.0 9.3  HGB 11.0* 11.6* 10.9* 10.1* 10.1* 10.8*  HCT 34.4* 34.0* 34.3* 31.0* 31.2* 34.2*  MCV 92.2  --  92.2 92.0 92.6 92.9  PLT 240  --  220 217 216 233   Cardiac Enzymes: No results for input(s): CKTOTAL, CKMB, CKMBINDEX, TROPONINI in the last 168 hours. BNP: Invalid input(s): POCBNP CBG: Recent Labs  Lab 07/07/17 0200 07/07/17 1737 07/07/17 2125 07/08/17 0731 07/08/17 1135  GLUCAP 95 164* 142* 123* 139*   D-Dimer No results for input(s): DDIMER in the last 72 hours. Hgb A1c No results for input(s): HGBA1C in the last 72 hours. Lipid Profile No results for input(s): CHOL, HDL, LDLCALC, TRIG, CHOLHDL, LDLDIRECT in the last 72  hours. Thyroid function studies No results for input(s): TSH, T4TOTAL, T3FREE, THYROIDAB in the last 72 hours.  Invalid input(s): FREET3 Anemia work up Recent Labs    07/07/17 1407  FERRITIN 40  TIBC 344  IRON 71   Urinalysis    Component Value Date/Time   COLORURINE  YELLOW 07/06/2017 2155   APPEARANCEUR CLEAR 07/06/2017 2155   LABSPEC 1.017 07/06/2017 2155   PHURINE 5.0 07/06/2017 2155   GLUCOSEU NEGATIVE 07/06/2017 2155   HGBUR MODERATE (A) 07/06/2017 2155   BILIRUBINUR NEGATIVE 07/06/2017 2155   BILIRUBINUR neg 04/27/2014 [place] 07/06/2017 2155   PROTEINUR 30 (A) 07/06/2017 2155   UROBILINOGEN 0.2 03/07/2015 1005   NITRITE NEGATIVE 07/06/2017 2155   LEUKOCYTESUR NEGATIVE 07/06/2017 2155   Sepsis Labs Invalid input(s): PROCALCITONIN,  WBC,  LACTICIDVEN Microbiology Recent Results (from the past 240 hour(s))  C difficile quick scan w PCR reflex     Status: None   Collection Time: 07/07/17  4:57 PM  Result Value Ref Range Status   C Diff antigen NEGATIVE NEGATIVE Final   C Diff toxin NEGATIVE NEGATIVE Final   C Diff interpretation No C. difficile detected.  Final     Time coordinating discharge: 28 minutes  SIGNED:   Rosita Fire, MD  [facility] 07/08/2017, 12:57 PM  If 7PM-7AM, please contact night-coverage [email or web address] Password [email or web address]

## 2017-07-16 DIAGNOSIS — F0390 Unspecified dementia without behavioral disturbance: Secondary | ICD-10-CM | POA: Diagnosis not present

## 2017-07-16 DIAGNOSIS — M6281 Muscle weakness (generalized): Secondary | ICD-10-CM | POA: Diagnosis not present

## 2017-07-16 DIAGNOSIS — E1159 Type 2 diabetes mellitus with other circulatory complications: Secondary | ICD-10-CM | POA: Diagnosis not present

## 2017-07-16 DIAGNOSIS — I69398 Other sequelae of cerebral infarction: Secondary | ICD-10-CM | POA: Diagnosis not present

## 2017-07-16 DIAGNOSIS — M4696 Unspecified inflammatory spondylopathy, lumbar region: Secondary | ICD-10-CM | POA: Diagnosis not present

## 2017-07-16 DIAGNOSIS — I69322 Dysarthria following cerebral infarction: Secondary | ICD-10-CM | POA: Diagnosis not present

## 2017-07-17 DIAGNOSIS — M6281 Muscle weakness (generalized): Secondary | ICD-10-CM | POA: Diagnosis not present

## 2017-07-17 DIAGNOSIS — M4696 Unspecified inflammatory spondylopathy, lumbar region: Secondary | ICD-10-CM | POA: Diagnosis not present

## 2017-07-17 DIAGNOSIS — E1159 Type 2 diabetes mellitus with other circulatory complications: Secondary | ICD-10-CM | POA: Diagnosis not present

## 2017-07-17 DIAGNOSIS — I69398 Other sequelae of cerebral infarction: Secondary | ICD-10-CM | POA: Diagnosis not present

## 2017-07-17 DIAGNOSIS — F0390 Unspecified dementia without behavioral disturbance: Secondary | ICD-10-CM | POA: Diagnosis not present

## 2017-07-17 DIAGNOSIS — I69322 Dysarthria following cerebral infarction: Secondary | ICD-10-CM | POA: Diagnosis not present

## 2017-07-22 ENCOUNTER — Other Ambulatory Visit: Payer: Self-pay

## 2017-07-22 ENCOUNTER — Encounter (HOSPITAL_COMMUNITY): Payer: Self-pay

## 2017-07-22 ENCOUNTER — Emergency Department (HOSPITAL_COMMUNITY)
Admission: EM | Admit: 2017-07-22 | Discharge: 2017-07-22 | Disposition: A | Discharge status: Home / Self Care | Payer: Medicare Other | Attending: Emergency Medicine | Admitting: Emergency Medicine

## 2017-07-22 DIAGNOSIS — Z8673 Personal history of transient ischemic attack (TIA), and cerebral infarction without residual deficits: Secondary | ICD-10-CM | POA: Diagnosis not present

## 2017-07-22 DIAGNOSIS — R197 Diarrhea, unspecified: Secondary | ICD-10-CM | POA: Diagnosis not present

## 2017-07-22 DIAGNOSIS — Z7982 Long term (current) use of aspirin: Secondary | ICD-10-CM | POA: Insufficient documentation

## 2017-07-22 DIAGNOSIS — Z87898 Personal history of other specified conditions: Secondary | ICD-10-CM | POA: Diagnosis not present

## 2017-07-22 DIAGNOSIS — Z86711 Personal history of pulmonary embolism: Secondary | ICD-10-CM | POA: Diagnosis not present

## 2017-07-22 DIAGNOSIS — K921 Melena: Secondary | ICD-10-CM | POA: Insufficient documentation

## 2017-07-22 DIAGNOSIS — J449 Chronic obstructive pulmonary disease, unspecified: Secondary | ICD-10-CM | POA: Diagnosis not present

## 2017-07-22 DIAGNOSIS — M4696 Unspecified inflammatory spondylopathy, lumbar region: Secondary | ICD-10-CM | POA: Diagnosis not present

## 2017-07-22 DIAGNOSIS — I10 Essential (primary) hypertension: Secondary | ICD-10-CM | POA: Insufficient documentation

## 2017-07-22 DIAGNOSIS — R195 Other fecal abnormalities: Secondary | ICD-10-CM

## 2017-07-22 DIAGNOSIS — K625 Hemorrhage of anus and rectum: Secondary | ICD-10-CM | POA: Diagnosis not present

## 2017-07-22 DIAGNOSIS — Z79899 Other long term (current) drug therapy: Secondary | ICD-10-CM | POA: Diagnosis not present

## 2017-07-22 DIAGNOSIS — K219 Gastro-esophageal reflux disease without esophagitis: Secondary | ICD-10-CM | POA: Diagnosis not present

## 2017-07-22 DIAGNOSIS — Z7984 Long term (current) use of oral hypoglycemic drugs: Secondary | ICD-10-CM | POA: Insufficient documentation

## 2017-07-22 DIAGNOSIS — G309 Alzheimer's disease, unspecified: Secondary | ICD-10-CM | POA: Diagnosis not present

## 2017-07-22 DIAGNOSIS — K224 Dyskinesia of esophagus: Secondary | ICD-10-CM | POA: Diagnosis not present

## 2017-07-22 DIAGNOSIS — I69398 Other sequelae of cerebral infarction: Secondary | ICD-10-CM | POA: Diagnosis not present

## 2017-07-22 DIAGNOSIS — I2510 Atherosclerotic heart disease of native coronary artery without angina pectoris: Secondary | ICD-10-CM | POA: Insufficient documentation

## 2017-07-22 DIAGNOSIS — I69322 Dysarthria following cerebral infarction: Secondary | ICD-10-CM | POA: Diagnosis not present

## 2017-07-22 DIAGNOSIS — G4733 Obstructive sleep apnea (adult) (pediatric): Secondary | ICD-10-CM | POA: Diagnosis not present

## 2017-07-22 DIAGNOSIS — E119 Type 2 diabetes mellitus without complications: Secondary | ICD-10-CM | POA: Insufficient documentation

## 2017-07-22 DIAGNOSIS — F0390 Unspecified dementia without behavioral disturbance: Secondary | ICD-10-CM | POA: Diagnosis not present

## 2017-07-22 DIAGNOSIS — K912 Postsurgical malabsorption, not elsewhere classified: Secondary | ICD-10-CM | POA: Diagnosis not present

## 2017-07-22 DIAGNOSIS — F0280 Dementia in other diseases classified elsewhere without behavioral disturbance: Secondary | ICD-10-CM | POA: Diagnosis not present

## 2017-07-22 DIAGNOSIS — M6281 Muscle weakness (generalized): Secondary | ICD-10-CM | POA: Diagnosis not present

## 2017-07-22 DIAGNOSIS — E1159 Type 2 diabetes mellitus with other circulatory complications: Secondary | ICD-10-CM | POA: Diagnosis not present

## 2017-07-22 LAB — COMPREHENSIVE METABOLIC PANEL
ALT: 19 U/L (ref 17–63)
AST: 24 U/L (ref 15–41)
Albumin: 4 g/dL (ref 3.5–5.0)
Alkaline Phosphatase: 115 U/L (ref 38–126)
Anion gap: 8 (ref 5–15)
BUN: 15 mg/dL (ref 6–20)
CALCIUM: 9.1 mg/dL (ref 8.9–10.3)
CHLORIDE: 104 mmol/L (ref 101–111)
CO2: 28 mmol/L (ref 22–32)
CREATININE: 0.84 mg/dL (ref 0.61–1.24)
Glucose, Bld: 81 mg/dL (ref 65–99)
Potassium: 3.9 mmol/L (ref 3.5–5.1)
Sodium: 140 mmol/L (ref 135–145)
Total Bilirubin: 0.4 mg/dL (ref 0.3–1.2)
Total Protein: 7.8 g/dL (ref 6.5–8.1)

## 2017-07-22 LAB — CBC
HCT: 36.7 % — ABNORMAL LOW (ref 39.0–52.0)
Hemoglobin: 11.8 g/dL — ABNORMAL LOW (ref 13.0–17.0)
MCH: 29.9 pg (ref 26.0–34.0)
MCHC: 32.2 g/dL (ref 30.0–36.0)
MCV: 92.9 fL (ref 78.0–100.0)
PLATELETS: 272 10*3/uL (ref 150–400)
RBC: 3.95 MIL/uL — AB (ref 4.22–5.81)
RDW: 14.3 % (ref 11.5–15.5)
WBC: 9.9 10*3/uL (ref 4.0–10.5)

## 2017-07-22 LAB — ABO/RH: ABO/RH(D): A POS

## 2017-07-22 LAB — TYPE AND SCREEN
ABO/RH(D): A POS
ANTIBODY SCREEN: NEGATIVE

## 2017-07-22 LAB — POC OCCULT BLOOD, ED: FECAL OCCULT BLD: NEGATIVE

## 2017-07-22 NOTE — ED Provider Notes (Signed)
[facility] DEPT Provider Note   CSN: [id number] Arrival date & time: 07/22/17  1635     History   Chief Complaint Chief Complaint  Patient presents with  . dark stool    [name] is a 71 y.o. male.  Jacob Patient presents with black stool.  Reportedly had coffee ground diarrhea today.  Recent admission for similar.  Discharged 2 weeks ago and actually followed up with gastroenterology today.  It was after this however that he began have the blood in stool.  No lightheadedness or dizziness.  States he feels bad but has been eating well.  Has a cough that is been chronic.  He is on aspirin for previous stroke.  Recently had some other anticoagulations stopped due to falls and bleeding. Past Medical History:  Diagnosis Date  . AKI (acute kidney injury) ([place])    a. 6/14: resolved after d/c ARB;  b. RA U/S 7/14: no RA stenosis  . Arthritis    of spine  . Atrial flutter ([place])   . Back pain    persistent  . Coronary artery disease    a. Low level exercise Lex MV 3/14: low risk, EF 62%, inf defect 2/2 diaph attenuation vs artifiact, small area of scar possible, no ischemia  . Depression   . Diabetes mellitus    controled by diet  . Diverticulitis   . GERD (gastroesophageal reflux disease)   . H/O alcohol abuse   . Headache(784.0)   . Hearing loss   . History of atrial flutter   . History of hiatal hernia   . History of kidney stones   . Hx of echocardiogram    Echo 7/14:  Mod LVH, EF 55-60%, Gr 1 DD, mild MR, PASP 36  . Hyperlipidemia   . Hypertension   . Renal stone 04/15/2014  . Seizures ([place])   . Shortness of breath   . Sleep apnea    does not use CPAP  . Stroke [facility])     Patient Active Problem List   Diagnosis Date Noted  . Rectal bleeding 07/07/2017  . Anemia   . Generalized weakness 04/25/2017  . General weakness 04/24/2017  . Cerebrovascular accident (CVA) due to embolism of right anterior cerebral artery ([place])  09/19/2015  . History of CVA with residual deficit 09/19/2015  . Type 2 diabetes mellitus with circulatory disorder ([place]) 07/12/2015  . HLD (hyperlipidemia) 07/12/2015  . Essential hypertension 07/12/2015  . Coronary artery disease involving coronary bypass graft of native heart with unspecified angina pectoris 07/12/2015  . Intracranial carotid stenosis 07/12/2015  . Morbid obesity ([place]) 02/21/2015  . Acute ischemic stroke ([place])   . Dysarthria   . History of pulmonary embolism   . Stroke ([place]) 02/11/2015  . CVA (cerebral infarction) 02/11/2015  . Acute right ACA stroke ([place]) 02/11/2015  . Lower extremity weakness 01/08/2015  . Status post placement of implantable loop recorder 08/01/2014  . Chronic respiratory failure, unspecified whether with hypoxia or hypercapnia ([place]) 05/19/2014  . Pulmonary embolism ([place]) 05/19/2014  . Hemiparesis and speech and language deficit as late effects of stroke ([place]) 04/19/2014  . Ureteral colic 79/09/4095  . Left ureteral stone 04/15/2014  . Obstructive sleep apnea 12/07/2013  . Obesity, unspecified 12/07/2013  . Embolic stroke ([place]) 35/32/9924  . Cough 06/09/2013  . S/P lumbar spine operation 06/02/2013  . Nerve root pain 06/02/2013  . Pleural plaque consistent with asbestos exposure by CT 10/2012 02/17/2013  . Renal failure 02/13/2013  .  Dyspnea 02/11/2013  . Dysphagia, pharyngoesophageal phase 05/12/2012  . Seizure disorder ([place]) 04/04/2012  . CAD (coronary artery disease) 06/13/2011  . HTN (hypertension) 06/13/2011  . Diabetes mellitus ([place]) 06/13/2011  . History of seizure disorder   . Depression   . Back pain   . Hearing loss   . Arrhythmia   . Diverticulitis   . H/O alcohol abuse   . Personal history of other diseases of circulatory system 05/29/2011    Past Surgical History:  Procedure Laterality Date  . APPENDECTOMY    . BACK SURGERY    . CARDIAC CATHETERIZATION    . CERVICAL FUSION    . CORONARY ARTERY BYPASS GRAFT  2005    LIMA graft to LAD,saphenous vein graft to diag.,circumflex, marginal,and to the RCA  . CYSTOSCOPY/RETROGRADE/URETEROSCOPY/STONE EXTRACTION WITH BASKET Left 04/16/2014   Procedure: CYSTOSCOPY/RETROGRADE/LEFT URETEROSCOPY/STONE EXTRACTION WITH BASKET AND LASER LITHOTRIPSY;  Surgeon: Malka So, MD;  Location: [facility]S;  Service: Urology;  Laterality: Left;  With STENT  . ESOPHAGOGASTRODUODENOSCOPY  02/10/2012   Procedure: ESOPHAGOGASTRODUODENOSCOPY (EGD);  Surgeon: Cleotis Nipper, MD;  Location: [facility] ENDOSCOPY;  Service: Endoscopy;  Laterality: N/A;  . ESOPHAGOGASTRODUODENOSCOPY  06/29/2012   Procedure: ESOPHAGOGASTRODUODENOSCOPY (EGD);  Surgeon: Winfield Cunas., MD;  Location: [facility];  Service: Endoscopy;  Laterality: N/A;  . FOREIGN BODY REMOVAL  02/10/2012   Procedure: FOREIGN BODY REMOVAL;  Surgeon: Cleotis Nipper, MD;  Location: [facility];  Service: Endoscopy;  Laterality: N/A;  . FOREIGN BODY REMOVAL  06/29/2012   Procedure: FOREIGN BODY REMOVAL;  Surgeon: Winfield Cunas., MD;  Location: [facility] ENDOSCOPY;  Service: Endoscopy;  Laterality: N/A;  . LEFT HEART CATHETERIZATION WITH CORONARY ANGIOGRAM N/A 04/02/2012   Procedure: LEFT HEART CATHETERIZATION WITH CORONARY ANGIOGRAM;  Surgeon: Sinclair Grooms, MD;  Location: [facility] CATH LAB;  Service: Cardiovascular;  Laterality: N/A;  . LOOP RECORDER IMPLANT N/A 11/17/2013   Procedure: LOOP RECORDER IMPLANT;  Surgeon: Deboraha Sprang, MD;  Location: [facility] CATH LAB;  Service: Cardiovascular;  Laterality: N/A;  . ORCHIECTOMY    . PARTIAL COLECTOMY     for diverticuli  . SAVORY DILATION  06/29/2012   Procedure: SAVORY DILATION;  Surgeon: Winfield Cunas., MD;  Location: [facility] ENDOSCOPY;  Service: Endoscopy;  Laterality: N/A;  . TEE WITHOUT CARDIOVERSION N/A 11/17/2013   Procedure: TRANSESOPHAGEAL ECHOCARDIOGRAM (TEE);  Surgeon: Larey Dresser, MD;  Location: [facility];  Service: Cardiovascular;  Laterality: N/A;  . UMBILICAL HERNIA REPAIR          Home Medications    Prior to Admission medications   Medication Sig Start Date End Date Taking? Authorizing Provider  acetaminophen (TYLENOL) 500 MG tablet Take 500 mg by mouth every 6 (six) hours as needed.   Yes [provider]  ARIPiprazole (ABILIFY) 5 MG tablet Take 5 mg by mouth daily.    Yes [provider]  aspirin 325 MG EC tablet Take 325 mg daily by mouth.   Yes [provider]  atorvastatin (LIPITOR) 80 MG tablet Take 1 tablet (80 mg total) by mouth daily. Patient taking differently: Take 80 mg by mouth at bedtime.  10/29/13  Yes Copland, Gay Filler, MD  colestipol (COLESTID) 1 g tablet Take 1 g by mouth 2 (two) times daily. 07/10/17  Yes [provider]  diphenoxylate-atropine (LOMOTIL) 2.5-0.025 MG tablet Take 1 tablet 2 (two) times daily as needed by mouth for diarrhea or loose stools.   Yes [provider]  donepezil (ARICEPT ODT) 10 MG disintegrating tablet Take 10 mg by mouth daily.    Yes [provider]  FLUoxetine (PROZAC) 10 MG capsule Take 20 mg every morning by mouth.    Yes [provider]  fluticasone (FLONASE) 50 MCG/ACT nasal spray Place 2 sprays into both nostrils daily.   Yes [provider]  furosemide (LASIX) 20 MG tablet TAKE 1-2 TABLETS BY MOUTH EVERY DAY AS NEEDED Patient taking differently: 20 mg mg DAILY in the morning (4am) 10/24/14  Yes [place], Peter M, MD  glipiZIDE (GLUCOTROL) 5 MG tablet Take 2.5 mg by mouth 2 (two) times daily before a meal.    Yes [provider]  hydroxypropyl methylcellulose / hypromellose (ISOPTO TEARS / GONIOVISC) 2.5 % ophthalmic solution Place 1 drop into both eyes as needed for dry eyes.   Yes [provider]  lamoTRIgine (LAMICTAL) 100 MG tablet Take 1 tablet (100 mg total) by mouth 2 (two) times daily. 10/03/15  Yes Rosalin Hawking, MD  loperamide (IMODIUM) 2 MG capsule Take 2 mg by mouth as needed for diarrhea or loose stools.   Yes  [provider]  Multiple Vitamin (MULTIVITAMIN) capsule Take 1 capsule by mouth daily.   Yes [provider]  nitroGLYCERIN (NITROSTAT) 0.4 MG SL tablet Place 1 tablet (0.4 mg total) under the tongue every 5 (five) minutes as needed. For chest pain 03/10/15  Yes [place], Peter M, MD  omeprazole (PRILOSEC) 40 MG capsule Take 1 capsule (40 mg total) daily by mouth. 07/08/17  Yes Rosita Fire, MD  potassium citrate (UROCIT-K) 10 MEQ (1080 MG) SR tablet Take 10 mEq 2 (two) times daily by mouth.  07/08/14  Yes [provider]  sitaGLIPtin (JANUVIA) 100 MG tablet Take 100 mg by mouth every morning. Reported on 09/19/2015   Yes [provider]  traZODone (DESYREL) 100 MG tablet Take 100 mg by mouth at bedtime.    Yes [provider]  Triamcinolone Acetonide (TRIAMCINOLONE 0.1 % CREAM : EUCERIN) CREA Apply 1 application topically daily. TRIAMCINOLONE 1%/ Absorbase   Yes [provider]  ONE TOUCH ULTRA TEST test strip USE TO TEST BLOOD SUGAR DAILY 07/11/15   Darlyne Russian, MD    Family History Family History  Problem Relation Age of Onset  . Emphysema Mother        was a smoker  . Asthma Mother   . Hypertension Mother   . Heart disease Father   . Prostate cancer Paternal Grandfather   . Pancreatic cancer Paternal Uncle   . Stroke Paternal Grandmother   . Heart attack Neg Hx     Social History Social History   Tobacco Use  . Smoking status: Never Smoker  . Smokeless tobacco: Never Used  Substance Use Topics  . Alcohol use: No    Comment: rare beer  . Drug use: No     Allergies   Cephalexin; Doxycycline; Keflex [cephalexin]; Lac bovis; Pentazocine lactate; Talwin [pentazocine]; Tape; and Zanaflex [tizanidine hcl]   Review of Systems Review of Systems  Constitutional: Negative for appetite change and fever.  HENT: Negative for congestion.   Respiratory: Positive for cough.   Cardiovascular: Negative for chest pain.   Gastrointestinal: Positive for blood in stool and diarrhea. Negative for abdominal pain and nausea.  Genitourinary: Negative for flank pain.  Musculoskeletal: Negative for back pain.  Skin: Negative for wound.  Neurological: Negative for numbness.  Hematological: Negative for adenopathy.  Psychiatric/Behavioral: Negative for agitation.  Physical Exam Updated Vital Signs BP 134/68 (BP Location: Right Arm)   Pulse 68   Temp 98.5 F (36.9 C) (Oral)   Resp 20   Ht 5' 6"  (1.676 m)   Wt 93.9 kg (207 lb)   SpO2 93%   BMI 33.41 kg/m   Physical Exam  Constitutional: He appears well-developed.  HENT:  Head: Atraumatic.  Eyes: Pupils are equal, round, and reactive to light.  Neck: Neck supple.  Cardiovascular: Normal rate.  Abdominal: He exhibits distension. There is no tenderness. There is no rebound and no guarding.  Musculoskeletal: He exhibits edema.  Neurological: He is alert.  Patient is awake and reportedly at his baseline.  Skin: Skin is warm. Capillary refill takes less than 2 seconds.  Rectal exam showed greenish stool with no gross blood.   ED Treatments / Results  Labs (all labs ordered are listed, but only abnormal results are displayed) Labs Reviewed  CBC - Abnormal; Notable for the following components:      Result Value   RBC 3.95 (*)    Hemoglobin 11.8 (*)    HCT 36.7 (*)    All other components within normal limits  COMPREHENSIVE METABOLIC PANEL  POC OCCULT BLOOD, ED  TYPE AND SCREEN  ABO/RH    EKG  EKG Interpretation None       Radiology No results found.  Procedures Procedures (including critical care time)  Medications Ordered in ED Medications - No data to display   Initial Impression / Assessment and Plan / ED Course  I have reviewed the triage vital signs and the nursing notes.  Pertinent labs & imaging results that were available during my care of the patient were reviewed by me and considered in my medical decision making  (see chart for details).     Patient with black stool at home.  Green stool here.  Guaiac negative and hemoglobin is improved from recent admission.  Will discharge home to follow-up with GI as needed.  Final Clinical Impressions(s) / ED Diagnoses   Final diagnoses:  Dark stools    ED Discharge Orders    None       Davonna Belling, MD 07/22/17 2134

## 2017-07-22 NOTE — Discharge Instructions (Signed)
Call your gastroenterologist and tell them you had the episode of black stool.  Return for worsening bleeding.  There was no blood in the stool that we checked and your hemoglobin is good.

## 2017-07-22 NOTE — ED Triage Notes (Signed)
Patient c/o dark stool around 1500 today. Patient's wife reports that the patient was admitted recently for rectal bleeding. Patient's wife reports iron in one of his meds and blood thinner was discontinued, but placed the patient on Aspirin 325 mg daily. Patient denies any abdominal pain.

## 2017-07-24 DIAGNOSIS — I69322 Dysarthria following cerebral infarction: Secondary | ICD-10-CM | POA: Diagnosis not present

## 2017-07-24 DIAGNOSIS — I69398 Other sequelae of cerebral infarction: Secondary | ICD-10-CM | POA: Diagnosis not present

## 2017-07-24 DIAGNOSIS — M4696 Unspecified inflammatory spondylopathy, lumbar region: Secondary | ICD-10-CM | POA: Diagnosis not present

## 2017-07-24 DIAGNOSIS — M6281 Muscle weakness (generalized): Secondary | ICD-10-CM | POA: Diagnosis not present

## 2017-07-24 DIAGNOSIS — E1159 Type 2 diabetes mellitus with other circulatory complications: Secondary | ICD-10-CM | POA: Diagnosis not present

## 2017-07-24 DIAGNOSIS — F0390 Unspecified dementia without behavioral disturbance: Secondary | ICD-10-CM | POA: Diagnosis not present

## 2017-07-29 DIAGNOSIS — M6281 Muscle weakness (generalized): Secondary | ICD-10-CM | POA: Diagnosis not present

## 2017-07-29 DIAGNOSIS — I69322 Dysarthria following cerebral infarction: Secondary | ICD-10-CM | POA: Diagnosis not present

## 2017-07-29 DIAGNOSIS — M4696 Unspecified inflammatory spondylopathy, lumbar region: Secondary | ICD-10-CM | POA: Diagnosis not present

## 2017-07-29 DIAGNOSIS — I69398 Other sequelae of cerebral infarction: Secondary | ICD-10-CM | POA: Diagnosis not present

## 2017-07-29 DIAGNOSIS — E1159 Type 2 diabetes mellitus with other circulatory complications: Secondary | ICD-10-CM | POA: Diagnosis not present

## 2017-07-29 DIAGNOSIS — F0390 Unspecified dementia without behavioral disturbance: Secondary | ICD-10-CM | POA: Diagnosis not present

## 2017-08-01 DIAGNOSIS — I69322 Dysarthria following cerebral infarction: Secondary | ICD-10-CM | POA: Diagnosis not present

## 2017-08-01 DIAGNOSIS — F0390 Unspecified dementia without behavioral disturbance: Secondary | ICD-10-CM | POA: Diagnosis not present

## 2017-08-01 DIAGNOSIS — M4696 Unspecified inflammatory spondylopathy, lumbar region: Secondary | ICD-10-CM | POA: Diagnosis not present

## 2017-08-01 DIAGNOSIS — E1159 Type 2 diabetes mellitus with other circulatory complications: Secondary | ICD-10-CM | POA: Diagnosis not present

## 2017-08-01 DIAGNOSIS — M6281 Muscle weakness (generalized): Secondary | ICD-10-CM | POA: Diagnosis not present

## 2017-08-01 DIAGNOSIS — I69398 Other sequelae of cerebral infarction: Secondary | ICD-10-CM | POA: Diagnosis not present

## 2017-08-07 DIAGNOSIS — F0390 Unspecified dementia without behavioral disturbance: Secondary | ICD-10-CM | POA: Diagnosis not present

## 2017-08-07 DIAGNOSIS — G4733 Obstructive sleep apnea (adult) (pediatric): Secondary | ICD-10-CM | POA: Diagnosis not present

## 2017-08-07 DIAGNOSIS — E1159 Type 2 diabetes mellitus with other circulatory complications: Secondary | ICD-10-CM | POA: Diagnosis not present

## 2017-08-07 DIAGNOSIS — R197 Diarrhea, unspecified: Secondary | ICD-10-CM | POA: Diagnosis not present

## 2017-08-07 DIAGNOSIS — I69322 Dysarthria following cerebral infarction: Secondary | ICD-10-CM | POA: Diagnosis not present

## 2017-08-07 DIAGNOSIS — K912 Postsurgical malabsorption, not elsewhere classified: Secondary | ICD-10-CM | POA: Diagnosis not present

## 2017-08-07 DIAGNOSIS — M4696 Unspecified inflammatory spondylopathy, lumbar region: Secondary | ICD-10-CM | POA: Diagnosis not present

## 2017-08-07 DIAGNOSIS — I69398 Other sequelae of cerebral infarction: Secondary | ICD-10-CM | POA: Diagnosis not present

## 2017-08-07 DIAGNOSIS — Z87898 Personal history of other specified conditions: Secondary | ICD-10-CM | POA: Diagnosis not present

## 2017-08-07 DIAGNOSIS — G309 Alzheimer's disease, unspecified: Secondary | ICD-10-CM | POA: Diagnosis not present

## 2017-08-07 DIAGNOSIS — Z8673 Personal history of transient ischemic attack (TIA), and cerebral infarction without residual deficits: Secondary | ICD-10-CM | POA: Diagnosis not present

## 2017-08-07 DIAGNOSIS — M6281 Muscle weakness (generalized): Secondary | ICD-10-CM | POA: Diagnosis not present

## 2017-08-07 DIAGNOSIS — Z86711 Personal history of pulmonary embolism: Secondary | ICD-10-CM | POA: Diagnosis not present

## 2017-08-07 DIAGNOSIS — K219 Gastro-esophageal reflux disease without esophagitis: Secondary | ICD-10-CM | POA: Diagnosis not present

## 2017-08-07 DIAGNOSIS — K224 Dyskinesia of esophagus: Secondary | ICD-10-CM | POA: Diagnosis not present

## 2017-08-07 DIAGNOSIS — J449 Chronic obstructive pulmonary disease, unspecified: Secondary | ICD-10-CM | POA: Diagnosis not present

## 2017-08-08 DIAGNOSIS — F0390 Unspecified dementia without behavioral disturbance: Secondary | ICD-10-CM | POA: Diagnosis not present

## 2017-08-08 DIAGNOSIS — M6281 Muscle weakness (generalized): Secondary | ICD-10-CM | POA: Diagnosis not present

## 2017-08-08 DIAGNOSIS — E1159 Type 2 diabetes mellitus with other circulatory complications: Secondary | ICD-10-CM | POA: Diagnosis not present

## 2017-08-08 DIAGNOSIS — I69322 Dysarthria following cerebral infarction: Secondary | ICD-10-CM | POA: Diagnosis not present

## 2017-08-08 DIAGNOSIS — I69398 Other sequelae of cerebral infarction: Secondary | ICD-10-CM | POA: Diagnosis not present

## 2017-08-08 DIAGNOSIS — M4696 Unspecified inflammatory spondylopathy, lumbar region: Secondary | ICD-10-CM | POA: Diagnosis not present

## 2017-08-11 DIAGNOSIS — I69322 Dysarthria following cerebral infarction: Secondary | ICD-10-CM | POA: Diagnosis not present

## 2017-08-11 DIAGNOSIS — F0390 Unspecified dementia without behavioral disturbance: Secondary | ICD-10-CM | POA: Diagnosis not present

## 2017-08-11 DIAGNOSIS — E1159 Type 2 diabetes mellitus with other circulatory complications: Secondary | ICD-10-CM | POA: Diagnosis not present

## 2017-08-11 DIAGNOSIS — M6281 Muscle weakness (generalized): Secondary | ICD-10-CM | POA: Diagnosis not present

## 2017-08-11 DIAGNOSIS — I69398 Other sequelae of cerebral infarction: Secondary | ICD-10-CM | POA: Diagnosis not present

## 2017-08-11 DIAGNOSIS — M4696 Unspecified inflammatory spondylopathy, lumbar region: Secondary | ICD-10-CM | POA: Diagnosis not present

## 2017-08-13 DIAGNOSIS — M4696 Unspecified inflammatory spondylopathy, lumbar region: Secondary | ICD-10-CM | POA: Diagnosis not present

## 2017-08-13 DIAGNOSIS — I69398 Other sequelae of cerebral infarction: Secondary | ICD-10-CM | POA: Diagnosis not present

## 2017-08-13 DIAGNOSIS — E1159 Type 2 diabetes mellitus with other circulatory complications: Secondary | ICD-10-CM | POA: Diagnosis not present

## 2017-08-13 DIAGNOSIS — F0390 Unspecified dementia without behavioral disturbance: Secondary | ICD-10-CM | POA: Diagnosis not present

## 2017-08-13 DIAGNOSIS — M6281 Muscle weakness (generalized): Secondary | ICD-10-CM | POA: Diagnosis not present

## 2017-08-13 DIAGNOSIS — I69322 Dysarthria following cerebral infarction: Secondary | ICD-10-CM | POA: Diagnosis not present

## 2017-08-21 DIAGNOSIS — I69322 Dysarthria following cerebral infarction: Secondary | ICD-10-CM | POA: Diagnosis not present

## 2017-08-21 DIAGNOSIS — F0390 Unspecified dementia without behavioral disturbance: Secondary | ICD-10-CM | POA: Diagnosis not present

## 2017-08-21 DIAGNOSIS — M4696 Unspecified inflammatory spondylopathy, lumbar region: Secondary | ICD-10-CM | POA: Diagnosis not present

## 2017-08-21 DIAGNOSIS — M6281 Muscle weakness (generalized): Secondary | ICD-10-CM | POA: Diagnosis not present

## 2017-08-21 DIAGNOSIS — I69398 Other sequelae of cerebral infarction: Secondary | ICD-10-CM | POA: Diagnosis not present

## 2017-08-21 DIAGNOSIS — E1159 Type 2 diabetes mellitus with other circulatory complications: Secondary | ICD-10-CM | POA: Diagnosis not present

## 2017-08-23 DIAGNOSIS — I69322 Dysarthria following cerebral infarction: Secondary | ICD-10-CM | POA: Diagnosis not present

## 2017-08-23 DIAGNOSIS — M6281 Muscle weakness (generalized): Secondary | ICD-10-CM | POA: Diagnosis not present

## 2017-08-23 DIAGNOSIS — M4696 Unspecified inflammatory spondylopathy, lumbar region: Secondary | ICD-10-CM | POA: Diagnosis not present

## 2017-08-23 DIAGNOSIS — F0390 Unspecified dementia without behavioral disturbance: Secondary | ICD-10-CM | POA: Diagnosis not present

## 2017-08-23 DIAGNOSIS — E1159 Type 2 diabetes mellitus with other circulatory complications: Secondary | ICD-10-CM | POA: Diagnosis not present

## 2017-08-23 DIAGNOSIS — I69398 Other sequelae of cerebral infarction: Secondary | ICD-10-CM | POA: Diagnosis not present

## 2017-08-25 DIAGNOSIS — E1159 Type 2 diabetes mellitus with other circulatory complications: Secondary | ICD-10-CM | POA: Diagnosis not present

## 2017-08-25 DIAGNOSIS — I69353 Hemiplegia and hemiparesis following cerebral infarction affecting right non-dominant side: Secondary | ICD-10-CM | POA: Diagnosis not present

## 2017-08-25 DIAGNOSIS — K529 Noninfective gastroenteritis and colitis, unspecified: Secondary | ICD-10-CM | POA: Diagnosis not present

## 2017-08-25 DIAGNOSIS — F0390 Unspecified dementia without behavioral disturbance: Secondary | ICD-10-CM | POA: Diagnosis not present

## 2017-08-25 DIAGNOSIS — Z7984 Long term (current) use of oral hypoglycemic drugs: Secondary | ICD-10-CM | POA: Diagnosis not present

## 2017-08-25 DIAGNOSIS — G4733 Obstructive sleep apnea (adult) (pediatric): Secondary | ICD-10-CM | POA: Diagnosis not present

## 2017-08-25 DIAGNOSIS — Z9181 History of falling: Secondary | ICD-10-CM | POA: Diagnosis not present

## 2017-08-25 DIAGNOSIS — Z7951 Long term (current) use of inhaled steroids: Secondary | ICD-10-CM | POA: Diagnosis not present

## 2017-08-25 DIAGNOSIS — I69322 Dysarthria following cerebral infarction: Secondary | ICD-10-CM | POA: Diagnosis not present

## 2017-08-25 DIAGNOSIS — J9610 Chronic respiratory failure, unspecified whether with hypoxia or hypercapnia: Secondary | ICD-10-CM | POA: Diagnosis not present

## 2017-08-25 DIAGNOSIS — Z7982 Long term (current) use of aspirin: Secondary | ICD-10-CM | POA: Diagnosis not present

## 2017-08-25 DIAGNOSIS — J449 Chronic obstructive pulmonary disease, unspecified: Secondary | ICD-10-CM | POA: Diagnosis not present

## 2017-08-25 DIAGNOSIS — F329 Major depressive disorder, single episode, unspecified: Secondary | ICD-10-CM | POA: Diagnosis not present

## 2017-08-25 DIAGNOSIS — M4696 Unspecified inflammatory spondylopathy, lumbar region: Secondary | ICD-10-CM | POA: Diagnosis not present

## 2017-08-25 DIAGNOSIS — I69391 Dysphagia following cerebral infarction: Secondary | ICD-10-CM | POA: Diagnosis not present

## 2017-08-25 DIAGNOSIS — I480 Paroxysmal atrial fibrillation: Secondary | ICD-10-CM | POA: Diagnosis not present

## 2017-08-25 DIAGNOSIS — I2510 Atherosclerotic heart disease of native coronary artery without angina pectoris: Secondary | ICD-10-CM | POA: Diagnosis not present

## 2017-08-25 DIAGNOSIS — Z9981 Dependence on supplemental oxygen: Secondary | ICD-10-CM | POA: Diagnosis not present

## 2017-08-25 DIAGNOSIS — R1314 Dysphagia, pharyngoesophageal phase: Secondary | ICD-10-CM | POA: Diagnosis not present

## 2017-08-25 DIAGNOSIS — Z86711 Personal history of pulmonary embolism: Secondary | ICD-10-CM | POA: Diagnosis not present

## 2017-08-25 DIAGNOSIS — D649 Anemia, unspecified: Secondary | ICD-10-CM | POA: Diagnosis not present

## 2017-08-25 DIAGNOSIS — G40909 Epilepsy, unspecified, not intractable, without status epilepticus: Secondary | ICD-10-CM | POA: Diagnosis not present

## 2017-08-25 DIAGNOSIS — I10 Essential (primary) hypertension: Secondary | ICD-10-CM | POA: Diagnosis not present

## 2017-08-26 ENCOUNTER — Encounter (HOSPITAL_COMMUNITY): Payer: Self-pay | Admitting: Emergency Medicine

## 2017-08-26 ENCOUNTER — Emergency Department (HOSPITAL_COMMUNITY): Payer: Medicare Other

## 2017-08-26 ENCOUNTER — Inpatient Hospital Stay (HOSPITAL_COMMUNITY)
Admission: EM | Admit: 2017-08-26 | Discharge: 2017-09-01 | DRG: 280 | Disposition: A | Discharge status: Skilled Nursing Facility | Payer: Medicare Other | Attending: Family Medicine | Admitting: Family Medicine

## 2017-08-26 ENCOUNTER — Other Ambulatory Visit: Payer: Self-pay

## 2017-08-26 DIAGNOSIS — K5090 Crohn's disease, unspecified, without complications: Secondary | ICD-10-CM | POA: Diagnosis present

## 2017-08-26 DIAGNOSIS — Z9981 Dependence on supplemental oxygen: Secondary | ICD-10-CM

## 2017-08-26 DIAGNOSIS — I10 Essential (primary) hypertension: Secondary | ICD-10-CM | POA: Diagnosis present

## 2017-08-26 DIAGNOSIS — E785 Hyperlipidemia, unspecified: Secondary | ICD-10-CM | POA: Diagnosis present

## 2017-08-26 DIAGNOSIS — F329 Major depressive disorder, single episode, unspecified: Secondary | ICD-10-CM | POA: Diagnosis present

## 2017-08-26 DIAGNOSIS — R0600 Dyspnea, unspecified: Secondary | ICD-10-CM | POA: Diagnosis present

## 2017-08-26 DIAGNOSIS — R69 Illness, unspecified: Secondary | ICD-10-CM

## 2017-08-26 DIAGNOSIS — Z77090 Contact with and (suspected) exposure to asbestos: Secondary | ICD-10-CM | POA: Diagnosis present

## 2017-08-26 DIAGNOSIS — K529 Noninfective gastroenteritis and colitis, unspecified: Secondary | ICD-10-CM | POA: Diagnosis not present

## 2017-08-26 DIAGNOSIS — A0472 Enterocolitis due to Clostridium difficile, not specified as recurrent: Secondary | ICD-10-CM | POA: Diagnosis not present

## 2017-08-26 DIAGNOSIS — K224 Dyskinesia of esophagus: Secondary | ICD-10-CM | POA: Diagnosis present

## 2017-08-26 DIAGNOSIS — Z91048 Other nonmedicinal substance allergy status: Secondary | ICD-10-CM

## 2017-08-26 DIAGNOSIS — D649 Anemia, unspecified: Secondary | ICD-10-CM | POA: Diagnosis not present

## 2017-08-26 DIAGNOSIS — K219 Gastro-esophageal reflux disease without esophagitis: Secondary | ICD-10-CM | POA: Diagnosis present

## 2017-08-26 DIAGNOSIS — G40909 Epilepsy, unspecified, not intractable, without status epilepticus: Secondary | ICD-10-CM

## 2017-08-26 DIAGNOSIS — I509 Heart failure, unspecified: Secondary | ICD-10-CM | POA: Diagnosis not present

## 2017-08-26 DIAGNOSIS — I110 Hypertensive heart disease with heart failure: Secondary | ICD-10-CM | POA: Diagnosis not present

## 2017-08-26 DIAGNOSIS — Z7984 Long term (current) use of oral hypoglycemic drugs: Secondary | ICD-10-CM

## 2017-08-26 DIAGNOSIS — Z951 Presence of aortocoronary bypass graft: Secondary | ICD-10-CM

## 2017-08-26 DIAGNOSIS — K648 Other hemorrhoids: Secondary | ICD-10-CM | POA: Diagnosis present

## 2017-08-26 DIAGNOSIS — I69152 Hemiplegia and hemiparesis following nontraumatic intracerebral hemorrhage affecting left dominant side: Secondary | ICD-10-CM | POA: Diagnosis not present

## 2017-08-26 DIAGNOSIS — R5381 Other malaise: Secondary | ICD-10-CM | POA: Diagnosis present

## 2017-08-26 DIAGNOSIS — I5033 Acute on chronic diastolic (congestive) heart failure: Secondary | ICD-10-CM | POA: Diagnosis not present

## 2017-08-26 DIAGNOSIS — R531 Weakness: Secondary | ICD-10-CM

## 2017-08-26 DIAGNOSIS — Z8669 Personal history of other diseases of the nervous system and sense organs: Secondary | ICD-10-CM

## 2017-08-26 DIAGNOSIS — I214 Non-ST elevation (NSTEMI) myocardial infarction: Secondary | ICD-10-CM

## 2017-08-26 DIAGNOSIS — I6930 Unspecified sequelae of cerebral infarction: Secondary | ICD-10-CM

## 2017-08-26 DIAGNOSIS — E119 Type 2 diabetes mellitus without complications: Secondary | ICD-10-CM

## 2017-08-26 DIAGNOSIS — I25700 Atherosclerosis of coronary artery bypass graft(s), unspecified, with unstable angina pectoris: Secondary | ICD-10-CM | POA: Diagnosis present

## 2017-08-26 DIAGNOSIS — Z79899 Other long term (current) drug therapy: Secondary | ICD-10-CM

## 2017-08-26 DIAGNOSIS — R05 Cough: Secondary | ICD-10-CM | POA: Diagnosis not present

## 2017-08-26 DIAGNOSIS — I21A1 Myocardial infarction type 2: Secondary | ICD-10-CM | POA: Diagnosis not present

## 2017-08-26 DIAGNOSIS — Z7982 Long term (current) use of aspirin: Secondary | ICD-10-CM

## 2017-08-26 DIAGNOSIS — I69322 Dysarthria following cerebral infarction: Secondary | ICD-10-CM

## 2017-08-26 DIAGNOSIS — Z825 Family history of asthma and other chronic lower respiratory diseases: Secondary | ICD-10-CM

## 2017-08-26 DIAGNOSIS — E1159 Type 2 diabetes mellitus with other circulatory complications: Secondary | ICD-10-CM | POA: Diagnosis present

## 2017-08-26 DIAGNOSIS — Z9049 Acquired absence of other specified parts of digestive tract: Secondary | ICD-10-CM

## 2017-08-26 DIAGNOSIS — G300 Alzheimer's disease with early onset: Secondary | ICD-10-CM | POA: Diagnosis present

## 2017-08-26 DIAGNOSIS — Z981 Arthrodesis status: Secondary | ICD-10-CM

## 2017-08-26 DIAGNOSIS — Z86711 Personal history of pulmonary embolism: Secondary | ICD-10-CM

## 2017-08-26 DIAGNOSIS — Z823 Family history of stroke: Secondary | ICD-10-CM

## 2017-08-26 DIAGNOSIS — Z8249 Family history of ischemic heart disease and other diseases of the circulatory system: Secondary | ICD-10-CM

## 2017-08-26 DIAGNOSIS — R3129 Other microscopic hematuria: Secondary | ICD-10-CM | POA: Diagnosis not present

## 2017-08-26 DIAGNOSIS — I25709 Atherosclerosis of coronary artery bypass graft(s), unspecified, with unspecified angina pectoris: Secondary | ICD-10-CM | POA: Diagnosis present

## 2017-08-26 DIAGNOSIS — Z6832 Body mass index (BMI) 32.0-32.9, adult: Secondary | ICD-10-CM

## 2017-08-26 DIAGNOSIS — I480 Paroxysmal atrial fibrillation: Secondary | ICD-10-CM | POA: Diagnosis present

## 2017-08-26 DIAGNOSIS — H9190 Unspecified hearing loss, unspecified ear: Secondary | ICD-10-CM | POA: Diagnosis present

## 2017-08-26 DIAGNOSIS — F32A Depression, unspecified: Secondary | ICD-10-CM | POA: Diagnosis present

## 2017-08-26 DIAGNOSIS — J9610 Chronic respiratory failure, unspecified whether with hypoxia or hypercapnia: Secondary | ICD-10-CM | POA: Diagnosis present

## 2017-08-26 DIAGNOSIS — E669 Obesity, unspecified: Secondary | ICD-10-CM | POA: Diagnosis present

## 2017-08-26 DIAGNOSIS — R0902 Hypoxemia: Secondary | ICD-10-CM | POA: Diagnosis not present

## 2017-08-26 DIAGNOSIS — F0280 Dementia in other diseases classified elsewhere without behavioral disturbance: Secondary | ICD-10-CM | POA: Diagnosis present

## 2017-08-26 DIAGNOSIS — Z7951 Long term (current) use of inhaled steroids: Secondary | ICD-10-CM

## 2017-08-26 DIAGNOSIS — J9611 Chronic respiratory failure with hypoxia: Secondary | ICD-10-CM | POA: Diagnosis not present

## 2017-08-26 DIAGNOSIS — G4733 Obstructive sleep apnea (adult) (pediatric): Secondary | ICD-10-CM | POA: Diagnosis present

## 2017-08-26 DIAGNOSIS — K5900 Constipation, unspecified: Secondary | ICD-10-CM | POA: Diagnosis not present

## 2017-08-26 DIAGNOSIS — Z881 Allergy status to other antibiotic agents status: Secondary | ICD-10-CM

## 2017-08-26 DIAGNOSIS — Z888 Allergy status to other drugs, medicaments and biological substances status: Secondary | ICD-10-CM

## 2017-08-26 DIAGNOSIS — L8990 Pressure ulcer of unspecified site, unspecified stage: Secondary | ICD-10-CM

## 2017-08-26 HISTORY — DX: Other pulmonary embolism without acute cor pulmonale: I26.99

## 2017-08-26 LAB — URINALYSIS, ROUTINE W REFLEX MICROSCOPIC
Bilirubin Urine: NEGATIVE
Glucose, UA: NEGATIVE mg/dL
KETONES UR: NEGATIVE mg/dL
Leukocytes, UA: NEGATIVE
Nitrite: NEGATIVE
PROTEIN: NEGATIVE mg/dL
Specific Gravity, Urine: 1.009 (ref 1.005–1.030)
pH: 5 (ref 5.0–8.0)

## 2017-08-26 LAB — CBC
HCT: 34.5 % — ABNORMAL LOW (ref 39.0–52.0)
Hemoglobin: 10.9 g/dL — ABNORMAL LOW (ref 13.0–17.0)
MCH: 29.1 pg (ref 26.0–34.0)
MCHC: 31.6 g/dL (ref 30.0–36.0)
MCV: 92 fL (ref 78.0–100.0)
Platelets: 321 10*3/uL (ref 150–400)
RBC: 3.75 MIL/uL — ABNORMAL LOW (ref 4.22–5.81)
RDW: 15.2 % (ref 11.5–15.5)
WBC: 13.7 10*3/uL — AB (ref 4.0–10.5)

## 2017-08-26 LAB — I-STAT TROPONIN, ED: Troponin i, poc: 0.34 ng/mL (ref 0.00–0.08)

## 2017-08-26 LAB — BASIC METABOLIC PANEL
ANION GAP: 8 (ref 5–15)
BUN: 13 mg/dL (ref 6–20)
CALCIUM: 8.5 mg/dL — AB (ref 8.9–10.3)
CO2: 25 mmol/L (ref 22–32)
Chloride: 103 mmol/L (ref 101–111)
Creatinine, Ser: 0.92 mg/dL (ref 0.61–1.24)
GFR calc Af Amer: >60 mL/min (ref >60)
Glucose, Bld: 104 mg/dL — ABNORMAL HIGH (ref 65–99)
POTASSIUM: 4.5 mmol/L (ref 3.5–5.1)
SODIUM: 136 mmol/L (ref 135–145)

## 2017-08-26 LAB — LIPASE, BLOOD: Lipase: 77 U/L — ABNORMAL HIGH (ref 11–51)

## 2017-08-26 LAB — HEPATIC FUNCTION PANEL
ALK PHOS: 138 U/L — AB (ref 38–126)
ALT: 84 U/L — AB (ref 17–63)
AST: 59 U/L — AB (ref 15–41)
Albumin: 3.2 g/dL — ABNORMAL LOW (ref 3.5–5.0)
BILIRUBIN DIRECT: 0.2 mg/dL (ref 0.1–0.5)
BILIRUBIN TOTAL: 0.7 mg/dL (ref 0.3–1.2)
Indirect Bilirubin: 0.5 mg/dL (ref 0.3–0.9)
Total Protein: 6.9 g/dL (ref 6.5–8.1)

## 2017-08-26 LAB — BRAIN NATRIURETIC PEPTIDE: B Natriuretic Peptide: 533.3 pg/mL — ABNORMAL HIGH (ref 0.0–100.0)

## 2017-08-26 MED ORDER — FUROSEMIDE 10 MG/ML IJ SOLN
40.0000 mg | Freq: Once | INTRAMUSCULAR | Status: AC
  Administered 2017-08-26: 40 mg via INTRAVENOUS
  Filled 2017-08-26: qty 4

## 2017-08-26 MED ORDER — ASPIRIN 81 MG PO CHEW
324.0000 mg | CHEWABLE_TABLET | Freq: Once | ORAL | Status: DC
  Filled 2017-08-26: qty 4

## 2017-08-26 NOTE — ED Notes (Signed)
First set of blood cultures obtained left wrist 35m each bottle

## 2017-08-26 NOTE — ED Triage Notes (Signed)
Pt brought in by EMS with c/o shortness of breath and not feeling well for the past two days  Pt states he has had diarrhea for the past two days and his abdomen is distended  Denies any pain

## 2017-08-26 NOTE — ED Provider Notes (Addendum)
[place] DEPT Provider Note   CSN: [id number] Arrival date & time: 08/26/17  2002     History   Chief Complaint Chief Complaint  Patient presents with  . Shortness of Breath  . Abdominal Distention    HPI Jacob CREEDEN is a 72 y.o. male.  HPI Patient with history of paroxysmal atrial fibrillation, stroke, diverticulitis, type 2 diabetes, hypertension, coronary artery disease.he also has chronic ongoing diarrhea.  Patient's wife reports that despite extensive evaluation and treatment, this persists and at one-point diverting colostomy was discussed.  She reports she is still working with the patient's doctors to determine what they can do for treatment.  In conjunction with this he has had bloody stool but this is been determined to be bleeding from hemorrhoids and very anal excoriation.  Patient's wife reports that despite this diarrhea, the patient gained approximately 8 pounds over 2 days.  She reports that his legs became quite swollen and he has developed shortness of breath.  Patient denies any chest pain.  His abdomen is distended but he denies any abdominal pain.  Patient's wife reports that he has been gulping air and then burping up large amounts of air repetitively.  No vomiting.  Past Medical History:  Diagnosis Date  . AKI (acute kidney injury) ([place])    a. 6/14: resolved after d/c ARB;  b. RA U/S 7/14: no RA stenosis  . Arthritis    of spine  . Atrial flutter ([place])   . Back pain    persistent  . Coronary artery disease    a. Low level exercise Lex MV 3/14: low risk, EF 62%, inf defect 2/2 diaph attenuation vs artifiact, small area of scar possible, no ischemia  . Depression   . Diabetes mellitus    controled by diet  . Diverticulitis   . GERD (gastroesophageal reflux disease)   . H/O alcohol abuse   . Headache(784.0)   . Hearing loss   . History of atrial flutter   . History of hiatal hernia   . History of kidney stones   . Hx of  echocardiogram    Echo 7/14:  Mod LVH, EF 55-60%, Gr 1 DD, mild MR, PASP 36  . Hyperlipidemia   . Hypertension   . Renal stone 04/15/2014  . Seizures ([place])   . Shortness of breath   . Sleep apnea    does not use CPAP  . Stroke [facility])     Patient Active Problem List   Diagnosis Date Noted  . Rectal bleeding 07/07/2017  . Anemia   . Generalized weakness 04/25/2017  . General weakness 04/24/2017  . Cerebrovascular accident (CVA) due to embolism of right anterior cerebral artery ([place]) 09/19/2015  . History of CVA with residual deficit 09/19/2015  . Type 2 diabetes mellitus with circulatory disorder ([place]) 07/12/2015  . HLD (hyperlipidemia) 07/12/2015  . Essential hypertension 07/12/2015  . Coronary artery disease involving coronary bypass graft of native heart with unspecified angina pectoris 07/12/2015  . Intracranial carotid stenosis 07/12/2015  . Morbid obesity ([place]) 02/21/2015  . Acute ischemic stroke ([place])   . Dysarthria   . History of pulmonary embolism   . Stroke ([place]) 02/11/2015  . CVA (cerebral infarction) 02/11/2015  . Acute right ACA stroke ([place]) 02/11/2015  . Lower extremity weakness 01/08/2015  . Status post placement of implantable loop recorder 08/01/2014  . Chronic respiratory failure, unspecified whether with hypoxia or hypercapnia ([place]) 05/19/2014  . Pulmonary embolism ([place]) 05/19/2014  .  Hemiparesis and speech and language deficit as late effects of stroke ([place]) 04/19/2014  . Ureteral colic 16/05/9603  . Left ureteral stone 04/15/2014  . Obstructive sleep apnea 12/07/2013  . Obesity, unspecified 12/07/2013  . Embolic stroke ([place]) 54/04/8118  . Cough 06/09/2013  . S/P lumbar spine operation 06/02/2013  . Nerve root pain 06/02/2013  . Pleural plaque consistent with asbestos exposure by CT 10/2012 02/17/2013  . Renal failure 02/13/2013  . Dyspnea 02/11/2013  . Dysphagia, pharyngoesophageal phase 05/12/2012  . Seizure disorder ([place]) 04/04/2012  . CAD (coronary  artery disease) 06/13/2011  . HTN (hypertension) 06/13/2011  . Diabetes mellitus ([place]) 06/13/2011  . History of seizure disorder   . Depression   . Back pain   . Hearing loss   . Arrhythmia   . Diverticulitis   . H/O alcohol abuse   . Personal history of other diseases of circulatory system 05/29/2011    Past Surgical History:  Procedure Laterality Date  . APPENDECTOMY    . BACK SURGERY    . CARDIAC CATHETERIZATION    . CERVICAL FUSION    . CORONARY ARTERY BYPASS GRAFT  2005   LIMA graft to LAD,saphenous vein graft to diag.,circumflex, marginal,and to the RCA  . CYSTOSCOPY/RETROGRADE/URETEROSCOPY/STONE EXTRACTION WITH BASKET Left 04/16/2014   Procedure: CYSTOSCOPY/RETROGRADE/LEFT URETEROSCOPY/STONE EXTRACTION WITH BASKET AND LASER LITHOTRIPSY;  Surgeon: Malka So, MD;  Location: [facility];  Service: Urology;  Laterality: Left;  With STENT  . ESOPHAGOGASTRODUODENOSCOPY  02/10/2012   Procedure: ESOPHAGOGASTRODUODENOSCOPY (EGD);  Surgeon: Cleotis Nipper, MD;  Location: [facility] ENDOSCOPY;  Service: Endoscopy;  Laterality: N/A;  . ESOPHAGOGASTRODUODENOSCOPY  06/29/2012   Procedure: ESOPHAGOGASTRODUODENOSCOPY (EGD);  Surgeon: Winfield Cunas., MD;  Location: [facility] ENDOSCOPY;  Service: Endoscopy;  Laterality: N/A;  . FOREIGN BODY REMOVAL  02/10/2012   Procedure: FOREIGN BODY REMOVAL;  Surgeon: Cleotis Nipper, MD;  Location: [facility];  Service: Endoscopy;  Laterality: N/A;  . FOREIGN BODY REMOVAL  06/29/2012   Procedure: FOREIGN BODY REMOVAL;  Surgeon: Winfield Cunas., MD;  Location: [facility] ENDOSCOPY;  Service: Endoscopy;  Laterality: N/A;  . LEFT HEART CATHETERIZATION WITH CORONARY ANGIOGRAM N/A 04/02/2012   Procedure: LEFT HEART CATHETERIZATION WITH CORONARY ANGIOGRAM;  Surgeon: Sinclair Grooms, MD;  Location: [facility] CATH LAB;  Service: Cardiovascular;  Laterality: N/A;  . LOOP RECORDER IMPLANT N/A 11/17/2013   Procedure: LOOP RECORDER IMPLANT;  Surgeon: Deboraha Sprang, MD;  Location: [facility] CATH  LAB;  Service: Cardiovascular;  Laterality: N/A;  . ORCHIECTOMY    . PARTIAL COLECTOMY     for diverticuli  . SAVORY DILATION  06/29/2012   Procedure: SAVORY DILATION;  Surgeon: Winfield Cunas., MD;  Location: [facility] ENDOSCOPY;  Service: Endoscopy;  Laterality: N/A;  . TEE WITHOUT CARDIOVERSION N/A 11/17/2013   Procedure: TRANSESOPHAGEAL ECHOCARDIOGRAM (TEE);  Surgeon: Larey Dresser, MD;  Location: [place];  Service: Cardiovascular;  Laterality: N/A;  . UMBILICAL HERNIA REPAIR         Home Medications    Prior to Admission medications   Medication Sig Start Date End Date Taking? Authorizing Provider  acetaminophen (TYLENOL) 325 MG tablet Take 650 mg by mouth every 6 (six) hours as needed for moderate pain.   Yes [provider]  ARIPiprazole (ABILIFY) 5 MG tablet Take 5 mg by mouth daily.    Yes [provider]  aspirin 325 MG EC tablet Take 325 mg daily by mouth.   Yes [provider]  atorvastatin (LIPITOR)  80 MG tablet Take 1 tablet (80 mg total) by mouth daily. Patient taking differently: Take 80 mg by mouth at bedtime.  10/29/13  Yes Copland, Gay Filler, MD  camphor-menthol [facility]) lotion Apply 1 application topically daily. To entire body   Yes [provider]  diphenoxylate-atropine (LOMOTIL) 2.5-0.025 MG tablet Take 2 tablets by mouth every 6 (six) hours.    Yes [provider]  donepezil (ARICEPT ODT) 10 MG disintegrating tablet Take 10 mg by mouth daily.    Yes [provider]  Fluocinolone Acetonide 0.01 % OIL Apply 1 application topically daily as needed (itching). Apply to scalp   Yes [provider]  FLUoxetine (PROZAC) 20 MG capsule Take 40 mg by mouth daily.   Yes [provider]  fluticasone (FLONASE) 50 MCG/ACT nasal spray Place 1 spray into both nostrils 2 (two) times daily as needed for rhinitis.    Yes [provider]  furosemide (LASIX) 20 MG tablet TAKE 1-2 TABLETS BY MOUTH EVERY DAY  AS NEEDED Patient taking differently: 20 mg mg DAILY in the morning (4am) 10/24/14  Yes Martinique, Peter M, MD  glipiZIDE (GLUCOTROL) 5 MG tablet Take 2.5 mg by mouth 2 (two) times daily before a meal. 30 minutes prior to meal   Yes [provider]  lamoTRIgine (LAMICTAL) 200 MG tablet Take 100 mg by mouth 2 (two) times daily.   Yes [provider]  Multiple Vitamin (MULTIVITAMIN) capsule Take 1 capsule by mouth daily.   Yes [provider]  ONE TOUCH ULTRA TEST test strip USE TO TEST BLOOD SUGAR DAILY 07/11/15  Yes Daub, Loura Back, MD  potassium citrate (UROCIT-K) 10 MEQ (1080 MG) SR tablet Take 10 mEq 2 (two) times daily by mouth.  07/08/14  Yes [provider]  traZODone (DESYREL) 100 MG tablet Take 100 mg by mouth at bedtime.    Yes [provider]  Triamcinolone Acetonide (TRIAMCINOLONE 0.1 % CREAM : EUCERIN) CREA Apply 1 application topically daily. TRIAMCINOLONE 1%/ Absorbase   Yes [provider]  lamoTRIgine (LAMICTAL) 100 MG tablet Take 1 tablet (100 mg total) by mouth 2 (two) times daily. Patient not taking: Reported on 08/26/2017 10/03/15   Rosalin Hawking, MD  nitroGLYCERIN (NITROSTAT) 0.4 MG SL tablet Place 1 tablet (0.4 mg total) under the tongue every 5 (five) minutes as needed. For chest pain 03/10/15   Martinique, Peter M, MD  omeprazole (PRILOSEC) 40 MG capsule Take 1 capsule (40 mg total) daily by mouth. Patient not taking: Reported on 08/26/2017 07/08/17   Rosita Fire, MD    Family History Family History  Problem Relation Age of Onset  . Emphysema Mother        was a smoker  . Asthma Mother   . Hypertension Mother   . Heart disease Father   . Prostate cancer Paternal Grandfather   . Pancreatic cancer Paternal Uncle   . Stroke Paternal Grandmother   . Heart attack Neg Hx     Social History Social History   Tobacco Use  . Smoking status: Never Smoker  . Smokeless tobacco: Never Used  Substance Use Topics  . Alcohol  use: No    Comment: pt states "I used to but not anymore"  . Drug use: No     Allergies   Cephalexin; Doxycycline; Keflex [cephalexin]; Lac bovis; Pentazocine lactate; Talwin [pentazocine]; Tape; and Zanaflex [tizanidine hcl]   Review of Systems Review of Systems 10 Systems reviewed and are negative for acute change  except as noted in the HPI.   Physical Exam Updated Vital Signs BP 109/70 (BP Location: Left Arm)   Pulse 78   Temp 97.8 F (36.6 C) (Oral)   Resp 13   Ht 5' 6"  (1.676 m)   Wt 95.3 kg (210 lb)   SpO2 96%   BMI 33.89 kg/m   Physical Exam  Constitutional:  Patient is alert.  He does not have acute respiratory distress at rest.  Central obesity.  HENT:  Head: Normocephalic and atraumatic.  Eyes: EOM are normal.  Cardiovascular: Normal rate and regular rhythm.  Sounds distant.  No gross rub murmur gallop.  Pulmonary/Chest: Effort normal and breath sounds normal.  Abdominal: Soft. He exhibits distension.  His abdomen is protuberant.  He denies pain to palpation.  Genitourinary:  Genitourinary Comments: Significantly excoriated peri-rectal tissues with nonthrombosed hemorrhoids.  Patient is on the bedside commode and stool is greenish semi-formed surrounded with some thin watery pink tinged fluid  Musculoskeletal: He exhibits edema. He exhibits no tenderness.  2+ pitting edema bilateral lower extremities.  Due to body habitus and general weakness patient is not particularly stable with transfers.  He can maintain his own weight but needs assistance for pivoting and transferring  Neurological: He is alert. No cranial nerve deficit. He exhibits normal muscle tone. Coordination normal.  Skin: Skin is warm and dry.  Psychiatric: He has a normal mood and affect.     ED Treatments / Results  Labs (all labs ordered are listed, but only abnormal results are displayed) Labs Reviewed  CBC - Abnormal; Notable for the following components:      Result Value   WBC 13.7  (*)    RBC 3.75 (*)    Hemoglobin 10.9 (*)    HCT 34.5 (*)    All other components within normal limits  BRAIN NATRIURETIC PEPTIDE - Abnormal; Notable for the following components:   B Natriuretic Peptide 533.3 (*)    All other components within normal limits  HEPATIC FUNCTION PANEL - Abnormal; Notable for the following components:   Albumin 3.2 (*)    AST 59 (*)    ALT 84 (*)    Alkaline Phosphatase 138 (*)    All other components within normal limits  LIPASE, BLOOD - Abnormal; Notable for the following components:   Lipase 77 (*)    All other components within normal limits  URINALYSIS, ROUTINE W REFLEX MICROSCOPIC - Abnormal; Notable for the following components:   Hgb urine dipstick SMALL (*)    Bacteria, UA RARE (*)    Squamous Epithelial / LPF 0-5 (*)    All other components within normal limits  BASIC METABOLIC PANEL - Abnormal; Notable for the following components:   Glucose, Bld 104 (*)    Calcium 8.5 (*)    All other components within normal limits  I-STAT TROPONIN, ED - Abnormal; Notable for the following components:   Troponin i, poc 0.34 (*)    All other components within normal limits  CULTURE, BLOOD (ROUTINE X 2)  CULTURE, BLOOD (ROUTINE X 2)  POC OCCULT BLOOD, ED    EKG  EKG Interpretation  Date/Time:  Tuesday August 26 2017 20:52:28 EST Ventricular Rate:  89 PR Interval:    QRS Duration: 90 QT Interval:  391 QTC Calculation: 476 R Axis:   72 Text Interpretation:  Sinus rhythm Probable left atrial enlargement Anterior infarct, old Nonspecific repol abnormality, lateral leads Confirmed by Charlesetta Shanks [phone]) on 08/26/2017 9:41:58 PM  Radiology Ct Abdomen Pelvis Wo Contrast  Result Date: 08/27/2017 CLINICAL DATA:  Acute onset of generalized abdominal distention and diarrhea. Leukocytosis. Elevated lipase. Microhematuria. EXAM: CT ABDOMEN AND PELVIS WITHOUT CONTRAST TECHNIQUE: Multidetector CT imaging of the abdomen and pelvis was performed  following the standard protocol without IV contrast. COMPARISON:  CT of the abdomen and pelvis from 07/06/2017 FINDINGS: Lower chest: Trace bilateral pleural fluid is noted, with interstitial prominence, likely reflecting mild interstitial edema. Scattered coronary artery calcifications are seen. The patient is status post median sternotomy. Hepatobiliary: The liver is unremarkable in appearance. The gallbladder is unremarkable in appearance. The common bile duct remains normal in caliber. Pancreas: The pancreas is within normal limits. Spleen: The spleen is unremarkable in appearance. Adrenals/Urinary Tract: The adrenal glands are unremarkable in appearance. The kidneys are within normal limits. There is no evidence of hydronephrosis. No renal or ureteral stones are identified. Nonspecific perinephric stranding and fluid is noted bilaterally. Stomach/Bowel: The patient is status post resection of much of the colon. The ileocolic anastomosis is unremarkable. Remaining small bowel is unremarkable in appearance. The stomach is within normal limits. Vascular/Lymphatic: Diffuse calcification is seen along the abdominal aorta and its branches. The abdominal aorta is otherwise grossly unremarkable. The inferior vena cava is grossly unremarkable. No retroperitoneal lymphadenopathy is seen. No pelvic sidewall lymphadenopathy is identified. Reproductive: The bladder is mildly distended and grossly unremarkable. The prostate remains normal in size, with minimal calcification. A penile implant is noted, with a right-sided pelvic reservoir. Other: No additional soft tissue abnormalities are seen. Musculoskeletal: No acute osseous abnormalities are identified. The patient is status post lumbosacral spinal fusion at L5-S1. The visualized musculature is unremarkable in appearance. IMPRESSION: 1. Trace bilateral pleural fluid, with interstitial prominence, likely reflecting mild interstitial edema. 2. Scattered coronary artery  calcifications. 3. Ileocolic anastomosis is unremarkable in appearance. Aortic Atherosclerosis (ICD10-I70.0). Electronically Signed   By: Garald Balding M.D.   On: 08/27/2017 00:25   Dg Chest 2 View  Result Date: 08/26/2017 CLINICAL DATA:  Congestion, cough, swelling, and shortness of breath. History of diabetes. Nonsmoker. EXAM: CHEST  2 VIEW COMPARISON:  04/24/2017 FINDINGS: Postoperative changes in the mediastinum. Cardiac enlargement with pulmonary vascular congestion. Interstitial opacities in the lung bases likely represent edema. Small bilateral pleural effusions. No focal consolidation. No pneumothorax. Calcification of the aorta. Degenerative changes in the spine. IMPRESSION: Cardiac enlargement with pulmonary vascular congestion and mild interstitial edema. Small bilateral pleural effusions. Electronically Signed   By: Lucienne Capers M.D.   On: 08/26/2017 21:10   Dg Abdomen 1 View  Result Date: 08/26/2017 CLINICAL DATA:  Acute onset of shortness of breath. Diarrhea. Abdominal distention. EXAM: ABDOMEN - 1 VIEW COMPARISON:  CT of the abdomen and pelvis from 07/06/2017 FINDINGS: There is distention of small-bowel loops 4.2 cm in diameter, raising concern for partial obstruction or dysmotility. Residual air is seen along the descending colon. The stomach is partially distended with air. No free intra-abdominal air is identified, though evaluation for free air is limited on a single supine view. The patient is status post spinal fusion at L5-S1; the sacroiliac joints are unremarkable in appearance. The visualized lung bases are essentially clear. IMPRESSION: Distention of small-bowel loops to 4.2 cm in diameter, raising concern for partial small bowel obstruction or dysmotility. Residual air seen within the descending colon. Electronically Signed   By: Garald Balding M.D.   On: 08/26/2017 22:09    Procedures Procedures (including critical care time) CRITICAL CARE Performed by: Si Gaul  Total critical care time: 30 minutes  Critical care time was exclusive of separately billable procedures and treating other patients.  Critical care was necessary to treat or prevent imminent or life-threatening deterioration.  Critical care was time spent personally by me on the following activities: development of treatment plan with patient and/or surrogate as well as nursing, discussions with consultants, evaluation of patient's response to treatment, examination of patient, obtaining history from patient or surrogate, ordering and performing treatments and interventions, ordering and review of laboratory studies, ordering and review of radiographic studies, pulse oximetry and re-evaluation of patient's condition. Medications Ordered in ED Medications  aspirin chewable tablet 324 mg (324 mg Oral Not Given 08/26/17 2231)  furosemide (LASIX) injection 40 mg (40 mg Intravenous Given 08/26/17 2219)     Initial Impression / Assessment and Plan / ED Course  I have reviewed the triage vital signs and the nursing notes.  Pertinent labs & imaging results that were available during my care of the patient were reviewed by me and considered in my medical decision making (see chart for details).    Consult cardiology fellow (22: 47) Dr. Neena Rhymes.  Recommends to start IV heparin once adequately determined patient does not have ongoing GI bleed.  Will also plan for transfer to [facility] in the event patient needs subsequent catheterization. Consult: Dr. Melvia Heaps.  Will admit for the hospitalist group and plan for transfer to [facility].  Final Clinical Impressions(s) / ED Diagnoses   Final diagnoses:  Acute congestive heart failure, unspecified heart failure type ([place])  NSTEMI (non-ST elevated myocardial infarction) (HCC)  Chronic diarrhea  Severe comorbid illness  Has multiple severe chronic illnesses.  Primary reason the patient's wife brought him today was due to significant increased swelling  with development of shortness of breath.  Patient appears to have CHF.  BNP is elevated, and chest shows vascular congestion with small pleural effusions.  Patient has elevated troponin with out associated chest pain.  At this time suggestive of NSTEMI vs demand ischemia.  CT abdomen does not identify any obstruction or acute findings.  Patient's rectal bleeding appears to be perianal local excoriation and hemorrhoids.  Based on discussion with cardiology and concern for possible N STEMI will initiate heparin with plan to discontinue if any significant GI bleeding should develop.  Lasix has been administered for volume overload.  Patient took aspirin 325 today at home. ED Discharge Orders    None       Charlesetta Shanks, MD 08/27/17 [place], [place], MD 08/27/17 [phone]

## 2017-08-27 ENCOUNTER — Inpatient Hospital Stay (HOSPITAL_COMMUNITY): Payer: Medicare Other

## 2017-08-27 ENCOUNTER — Inpatient Hospital Stay (HOSPITAL_COMMUNITY)
Admit: 2017-08-27 | Discharge: 2017-08-27 | Disposition: A | Discharge status: Home / Self Care | Payer: Medicare Other | Attending: Physician Assistant | Admitting: Physician Assistant

## 2017-08-27 ENCOUNTER — Encounter (HOSPITAL_COMMUNITY): Payer: Self-pay | Admitting: Physician Assistant

## 2017-08-27 DIAGNOSIS — I480 Paroxysmal atrial fibrillation: Secondary | ICD-10-CM | POA: Diagnosis present

## 2017-08-27 DIAGNOSIS — G4733 Obstructive sleep apnea (adult) (pediatric): Secondary | ICD-10-CM | POA: Diagnosis present

## 2017-08-27 DIAGNOSIS — R41841 Cognitive communication deficit: Secondary | ICD-10-CM | POA: Diagnosis not present

## 2017-08-27 DIAGNOSIS — K912 Postsurgical malabsorption, not elsewhere classified: Secondary | ICD-10-CM | POA: Diagnosis not present

## 2017-08-27 DIAGNOSIS — K219 Gastro-esophageal reflux disease without esophagitis: Secondary | ICD-10-CM | POA: Diagnosis present

## 2017-08-27 DIAGNOSIS — I5031 Acute diastolic (congestive) heart failure: Secondary | ICD-10-CM | POA: Diagnosis not present

## 2017-08-27 DIAGNOSIS — R0609 Other forms of dyspnea: Secondary | ICD-10-CM | POA: Diagnosis not present

## 2017-08-27 DIAGNOSIS — Z6832 Body mass index (BMI) 32.0-32.9, adult: Secondary | ICD-10-CM | POA: Diagnosis not present

## 2017-08-27 DIAGNOSIS — I69322 Dysarthria following cerebral infarction: Secondary | ICD-10-CM | POA: Diagnosis not present

## 2017-08-27 DIAGNOSIS — K648 Other hemorrhoids: Secondary | ICD-10-CM | POA: Diagnosis present

## 2017-08-27 DIAGNOSIS — I5032 Chronic diastolic (congestive) heart failure: Secondary | ICD-10-CM | POA: Diagnosis not present

## 2017-08-27 DIAGNOSIS — Z8249 Family history of ischemic heart disease and other diseases of the circulatory system: Secondary | ICD-10-CM | POA: Diagnosis not present

## 2017-08-27 DIAGNOSIS — I25709 Atherosclerosis of coronary artery bypass graft(s), unspecified, with unspecified angina pectoris: Secondary | ICD-10-CM | POA: Diagnosis not present

## 2017-08-27 DIAGNOSIS — E785 Hyperlipidemia, unspecified: Secondary | ICD-10-CM | POA: Diagnosis not present

## 2017-08-27 DIAGNOSIS — I5033 Acute on chronic diastolic (congestive) heart failure: Secondary | ICD-10-CM | POA: Diagnosis not present

## 2017-08-27 DIAGNOSIS — K5090 Crohn's disease, unspecified, without complications: Secondary | ICD-10-CM | POA: Diagnosis present

## 2017-08-27 DIAGNOSIS — K591 Functional diarrhea: Secondary | ICD-10-CM | POA: Diagnosis not present

## 2017-08-27 DIAGNOSIS — I214 Non-ST elevation (NSTEMI) myocardial infarction: Secondary | ICD-10-CM | POA: Diagnosis not present

## 2017-08-27 DIAGNOSIS — K5900 Constipation, unspecified: Secondary | ICD-10-CM | POA: Diagnosis not present

## 2017-08-27 DIAGNOSIS — J80 Acute respiratory distress syndrome: Secondary | ICD-10-CM | POA: Diagnosis not present

## 2017-08-27 DIAGNOSIS — R2689 Other abnormalities of gait and mobility: Secondary | ICD-10-CM | POA: Diagnosis not present

## 2017-08-27 DIAGNOSIS — G40909 Epilepsy, unspecified, not intractable, without status epilepticus: Secondary | ICD-10-CM | POA: Diagnosis not present

## 2017-08-27 DIAGNOSIS — R609 Edema, unspecified: Secondary | ICD-10-CM | POA: Diagnosis not present

## 2017-08-27 DIAGNOSIS — Z86711 Personal history of pulmonary embolism: Secondary | ICD-10-CM | POA: Diagnosis not present

## 2017-08-27 DIAGNOSIS — K529 Noninfective gastroenteritis and colitis, unspecified: Secondary | ICD-10-CM | POA: Diagnosis not present

## 2017-08-27 DIAGNOSIS — J9611 Chronic respiratory failure with hypoxia: Secondary | ICD-10-CM | POA: Diagnosis present

## 2017-08-27 DIAGNOSIS — F329 Major depressive disorder, single episode, unspecified: Secondary | ICD-10-CM | POA: Diagnosis present

## 2017-08-27 DIAGNOSIS — Z823 Family history of stroke: Secondary | ICD-10-CM | POA: Diagnosis not present

## 2017-08-27 DIAGNOSIS — Z8669 Personal history of other diseases of the nervous system and sense organs: Secondary | ICD-10-CM | POA: Diagnosis not present

## 2017-08-27 DIAGNOSIS — I509 Heart failure, unspecified: Secondary | ICD-10-CM | POA: Diagnosis not present

## 2017-08-27 DIAGNOSIS — E118 Type 2 diabetes mellitus with unspecified complications: Secondary | ICD-10-CM | POA: Diagnosis not present

## 2017-08-27 DIAGNOSIS — R0602 Shortness of breath: Secondary | ICD-10-CM | POA: Diagnosis not present

## 2017-08-27 DIAGNOSIS — Z77090 Contact with and (suspected) exposure to asbestos: Secondary | ICD-10-CM | POA: Diagnosis present

## 2017-08-27 DIAGNOSIS — A0472 Enterocolitis due to Clostridium difficile, not specified as recurrent: Secondary | ICD-10-CM | POA: Diagnosis present

## 2017-08-27 DIAGNOSIS — R498 Other voice and resonance disorders: Secondary | ICD-10-CM | POA: Diagnosis not present

## 2017-08-27 DIAGNOSIS — R69 Illness, unspecified: Secondary | ICD-10-CM | POA: Diagnosis not present

## 2017-08-27 DIAGNOSIS — I340 Nonrheumatic mitral (valve) insufficiency: Secondary | ICD-10-CM | POA: Diagnosis not present

## 2017-08-27 DIAGNOSIS — F0280 Dementia in other diseases classified elsewhere without behavioral disturbance: Secondary | ICD-10-CM | POA: Diagnosis present

## 2017-08-27 DIAGNOSIS — I69152 Hemiplegia and hemiparesis following nontraumatic intracerebral hemorrhage affecting left dominant side: Secondary | ICD-10-CM | POA: Diagnosis not present

## 2017-08-27 DIAGNOSIS — M6281 Muscle weakness (generalized): Secondary | ICD-10-CM | POA: Diagnosis not present

## 2017-08-27 DIAGNOSIS — H9190 Unspecified hearing loss, unspecified ear: Secondary | ICD-10-CM | POA: Diagnosis present

## 2017-08-27 DIAGNOSIS — E119 Type 2 diabetes mellitus without complications: Secondary | ICD-10-CM | POA: Diagnosis present

## 2017-08-27 DIAGNOSIS — R197 Diarrhea, unspecified: Secondary | ICD-10-CM | POA: Diagnosis not present

## 2017-08-27 DIAGNOSIS — R531 Weakness: Secondary | ICD-10-CM | POA: Diagnosis not present

## 2017-08-27 DIAGNOSIS — I21A1 Myocardial infarction type 2: Secondary | ICD-10-CM | POA: Diagnosis present

## 2017-08-27 DIAGNOSIS — I110 Hypertensive heart disease with heart failure: Secondary | ICD-10-CM | POA: Diagnosis present

## 2017-08-27 DIAGNOSIS — E669 Obesity, unspecified: Secondary | ICD-10-CM | POA: Diagnosis present

## 2017-08-27 DIAGNOSIS — I10 Essential (primary) hypertension: Secondary | ICD-10-CM | POA: Diagnosis not present

## 2017-08-27 DIAGNOSIS — I6930 Unspecified sequelae of cerebral infarction: Secondary | ICD-10-CM | POA: Diagnosis not present

## 2017-08-27 DIAGNOSIS — I25700 Atherosclerosis of coronary artery bypass graft(s), unspecified, with unstable angina pectoris: Secondary | ICD-10-CM | POA: Diagnosis present

## 2017-08-27 DIAGNOSIS — J9610 Chronic respiratory failure, unspecified whether with hypoxia or hypercapnia: Secondary | ICD-10-CM | POA: Diagnosis not present

## 2017-08-27 DIAGNOSIS — R3129 Other microscopic hematuria: Secondary | ICD-10-CM | POA: Diagnosis not present

## 2017-08-27 LAB — CBC
HEMATOCRIT: 32.6 % — AB (ref 39.0–52.0)
Hemoglobin: 10.7 g/dL — ABNORMAL LOW (ref 13.0–17.0)
MCH: 30.2 pg (ref 26.0–34.0)
MCHC: 32.8 g/dL (ref 30.0–36.0)
MCV: 92.1 fL (ref 78.0–100.0)
PLATELETS: 279 10*3/uL (ref 150–400)
RBC: 3.54 MIL/uL — AB (ref 4.22–5.81)
RDW: 15.2 % (ref 11.5–15.5)
WBC: 12.4 10*3/uL — ABNORMAL HIGH (ref 4.0–10.5)

## 2017-08-27 LAB — BASIC METABOLIC PANEL
ANION GAP: 10 (ref 5–15)
BUN: 12 mg/dL (ref 6–20)
CALCIUM: 8.5 mg/dL — AB (ref 8.9–10.3)
CHLORIDE: 102 mmol/L (ref 101–111)
CO2: 27 mmol/L (ref 22–32)
Creatinine, Ser: 0.91 mg/dL (ref 0.61–1.24)
GFR calc Af Amer: >60 mL/min (ref >60)
GFR calc non Af Amer: >60 mL/min (ref >60)
GLUCOSE: 105 mg/dL — AB (ref 65–99)
Potassium: 3.6 mmol/L (ref 3.5–5.1)
Sodium: 139 mmol/L (ref 135–145)

## 2017-08-27 LAB — TROPONIN I: Troponin I: 0.61 ng/mL (ref <0.03)

## 2017-08-27 LAB — PROTIME-INR
INR: 1.14
Prothrombin Time: 14.5 seconds (ref 11.4–15.2)

## 2017-08-27 LAB — TSH: TSH: 2.717 u[IU]/mL (ref 0.350–4.500)

## 2017-08-27 LAB — HEPARIN LEVEL (UNFRACTIONATED)
HEPARIN UNFRACTIONATED: 0.17 [IU]/mL — AB (ref 0.30–0.70)
Heparin Unfractionated: 0.59 IU/mL (ref 0.30–0.70)

## 2017-08-27 LAB — CBG MONITORING, ED
GLUCOSE-CAPILLARY: 104 mg/dL — AB (ref 65–99)
GLUCOSE-CAPILLARY: 100 mg/dL — AB (ref 65–99)
GLUCOSE-CAPILLARY: 147 mg/dL — AB (ref 65–99)

## 2017-08-27 LAB — APTT: APTT: 33 s (ref 24–36)

## 2017-08-27 LAB — GLUCOSE, CAPILLARY: Glucose-Capillary: 113 mg/dL — ABNORMAL HIGH (ref 65–99)

## 2017-08-27 LAB — D-DIMER, QUANTITATIVE: D-Dimer, Quant: 0.99 ug/mL-FEU — ABNORMAL HIGH (ref 0.00–0.50)

## 2017-08-27 LAB — POC OCCULT BLOOD, ED: Fecal Occult Bld: POSITIVE — AB

## 2017-08-27 MED ORDER — LAMOTRIGINE 100 MG PO TABS
100.0000 mg | ORAL_TABLET | Freq: Two times a day (BID) | ORAL | Status: DC
  Administered 2017-08-27 – 2017-09-01 (×11): 100 mg via ORAL
  Filled 2017-08-27 (×11): qty 1

## 2017-08-27 MED ORDER — INSULIN ASPART 100 UNIT/ML ~~LOC~~ SOLN
0.0000 [IU] | SUBCUTANEOUS | Status: DC
  Administered 2017-08-29 (×2): 1 [IU] via SUBCUTANEOUS
  Administered 2017-08-30: 2 [IU] via SUBCUTANEOUS
  Administered 2017-08-30 – 2017-08-31 (×2): 1 [IU] via SUBCUTANEOUS

## 2017-08-27 MED ORDER — ACETAMINOPHEN 325 MG PO TABS: 650.0000 mg | ORAL_TABLET | ORAL | Status: DC | PRN

## 2017-08-27 MED ORDER — HEPARIN BOLUS VIA INFUSION
2500.0000 [IU] | INTRAVENOUS | Status: AC
  Administered 2017-08-27: 2500 [IU] via INTRAVENOUS
  Filled 2017-08-27: qty 2500

## 2017-08-27 MED ORDER — HEPARIN (PORCINE) IN NACL 100-0.45 UNIT/ML-% IJ SOLN
1000.0000 [IU]/h | INTRAMUSCULAR | Status: DC
  Administered 2017-08-27: 1000 [IU]/h via INTRAVENOUS
  Filled 2017-08-27: qty 250

## 2017-08-27 MED ORDER — MULTIVITAMINS PO CAPS: 1.0000 | ORAL_CAPSULE | Freq: Every day | ORAL | Status: DC

## 2017-08-27 MED ORDER — ONDANSETRON HCL 4 MG/2ML IJ SOLN: 4.0000 mg | Freq: Four times a day (QID) | INTRAMUSCULAR | Status: DC | PRN

## 2017-08-27 MED ORDER — DONEPEZIL HCL 10 MG PO TABS
10.0000 mg | ORAL_TABLET | Freq: Every day | ORAL | Status: DC
  Administered 2017-08-27 – 2017-09-01 (×6): 10 mg via ORAL
  Filled 2017-08-27 (×6): qty 1

## 2017-08-27 MED ORDER — ADULT MULTIVITAMIN W/MINERALS CH
1.0000 | ORAL_TABLET | Freq: Every day | ORAL | Status: DC
  Administered 2017-08-27 – 2017-09-01 (×6): 1 via ORAL
  Filled 2017-08-27 (×5): qty 1

## 2017-08-27 MED ORDER — ARIPIPRAZOLE 5 MG PO TABS
5.0000 mg | ORAL_TABLET | Freq: Every day | ORAL | Status: DC
  Administered 2017-08-27 – 2017-09-01 (×6): 5 mg via ORAL
  Filled 2017-08-27 (×6): qty 1

## 2017-08-27 MED ORDER — HEPARIN BOLUS VIA INFUSION
3000.0000 [IU] | Freq: Once | INTRAVENOUS | Status: AC
  Administered 2017-08-27: 3000 [IU] via INTRAVENOUS
  Filled 2017-08-27: qty 3000

## 2017-08-27 MED ORDER — FLUOXETINE HCL 20 MG PO CAPS
40.0000 mg | ORAL_CAPSULE | Freq: Every day | ORAL | Status: DC
  Administered 2017-08-27 – 2017-09-01 (×6): 40 mg via ORAL
  Filled 2017-08-27 (×6): qty 2

## 2017-08-27 MED ORDER — ATORVASTATIN CALCIUM 80 MG PO TABS
80.0000 mg | ORAL_TABLET | Freq: Every day | ORAL | Status: DC
  Administered 2017-08-27 – 2017-08-31 (×5): 80 mg via ORAL
  Filled 2017-08-27 (×6): qty 1

## 2017-08-27 MED ORDER — HEPARIN (PORCINE) IN NACL 100-0.45 UNIT/ML-% IJ SOLN
1300.0000 [IU]/h | INTRAMUSCULAR | Status: DC
  Administered 2017-08-27 – 2017-08-29 (×2): 1300 [IU]/h via INTRAVENOUS
  Filled 2017-08-27 (×4): qty 250

## 2017-08-27 MED ORDER — DIPHENOXYLATE-ATROPINE 2.5-0.025 MG PO TABS
2.0000 | ORAL_TABLET | Freq: Four times a day (QID) | ORAL | Status: DC
  Administered 2017-08-27 – 2017-08-29 (×8): 2 via ORAL
  Filled 2017-08-27 (×8): qty 2

## 2017-08-27 MED ORDER — GLIPIZIDE 5 MG PO TABS
2.5000 mg | ORAL_TABLET | Freq: Two times a day (BID) | ORAL | Status: DC
  Administered 2017-08-27 – 2017-09-01 (×11): 2.5 mg via ORAL
  Filled 2017-08-27 (×14): qty 1

## 2017-08-27 MED ORDER — FUROSEMIDE 10 MG/ML IJ SOLN
40.0000 mg | Freq: Two times a day (BID) | INTRAMUSCULAR | Status: DC
  Administered 2017-08-27 – 2017-08-29 (×4): 40 mg via INTRAVENOUS
  Filled 2017-08-27 (×4): qty 4

## 2017-08-27 MED ORDER — TRAZODONE HCL 100 MG PO TABS
100.0000 mg | ORAL_TABLET | Freq: Every day | ORAL | Status: DC
  Administered 2017-08-27 – 2017-08-31 (×5): 100 mg via ORAL
  Filled 2017-08-27 (×5): qty 1

## 2017-08-27 MED ORDER — ASPIRIN EC 325 MG PO TBEC
325.0000 mg | DELAYED_RELEASE_TABLET | Freq: Every day | ORAL | Status: DC
  Administered 2017-08-27 – 2017-09-01 (×6): 325 mg via ORAL
  Filled 2017-08-27 (×6): qty 1

## 2017-08-27 MED ORDER — HYDROCODONE-ACETAMINOPHEN 5-325 MG PO TABS
1.0000 | ORAL_TABLET | ORAL | Status: DC | PRN
  Administered 2017-08-28 – 2017-08-30 (×3): 2 via ORAL
  Administered 2017-08-30: 1 via ORAL
  Administered 2017-08-31 (×2): 2 via ORAL
  Administered 2017-08-31: 1 via ORAL
  Administered 2017-08-31: 2 via ORAL
  Administered 2017-09-01: 1 via ORAL
  Filled 2017-08-27 (×3): qty 2
  Filled 2017-08-27: qty 1
  Filled 2017-08-27 (×3): qty 2
  Filled 2017-08-27: qty 1
  Filled 2017-08-27: qty 2

## 2017-08-27 MED ORDER — ACETAMINOPHEN 325 MG PO TABS: 650.0000 mg | ORAL_TABLET | Freq: Four times a day (QID) | ORAL | Status: DC | PRN

## 2017-08-27 MED ORDER — NITROGLYCERIN 0.4 MG SL SUBL: 0.4000 mg | SUBLINGUAL_TABLET | SUBLINGUAL | Status: DC | PRN

## 2017-08-27 MED ORDER — POTASSIUM CITRATE ER 10 MEQ (1080 MG) PO TBCR
10.0000 meq | EXTENDED_RELEASE_TABLET | Freq: Two times a day (BID) | ORAL | Status: DC
  Administered 2017-08-27 – 2017-09-01 (×11): 10 meq via ORAL
  Filled 2017-08-27 (×14): qty 1

## 2017-08-27 MED ORDER — HYDROCODONE-ACETAMINOPHEN 5-325 MG PO TABS
2.0000 | ORAL_TABLET | Freq: Once | ORAL | Status: AC
  Administered 2017-08-27: 2 via ORAL
  Filled 2017-08-27: qty 2

## 2017-08-27 NOTE — Progress Notes (Signed)
Patient ID: [name], male   DOB: 01/03/1946, 72 y.o.   MRN: [id number] Patient was admitted earlier this morning for shortness of breath and worsening diarrhea with positive troponins. Patient seen and examined at bedside and plan of care discussed with him. Patient is waiting to be transferred to [facility] as per Cardiology recommendations. Cardiology evaluation is pending. Continue Heparin drip. Will consult GI as well because of worsening diarrhea. Repeat AM labs. Follow Echo.

## 2017-08-27 NOTE — ED Notes (Signed)
Report called to [facility] cone floor RN

## 2017-08-27 NOTE — Progress Notes (Signed)
ANTICOAGULATION CONSULT NOTE - Follow Up Consult  Pharmacy Consult for Heparin Indication: chest pain/ACS  Allergies  Allergen Reactions  . Cephalexin     Unknown  . Doxycycline     Unknown  . Keflex [Cephalexin]   . Lac Bovis Other (See Comments)    lactose intolerant  . Pentazocine Lactate     He passed out- he had a seizure.  This occurred around 2000  . Talwin [Pentazocine] Other (See Comments)    hallucinations  . Tape Other (See Comments)    Paper tape only please.  Freddi Starr [Tizanidine Hcl] Other (See Comments)    Lightheaded and dizzy    Patient Measurements: Height: 5' 6"  (167.6 cm) Weight: 210 lb (95.3 kg) IBW/kg (Calculated) : 63.8 Heparin Dosing Weight: 84.4 kg  Vital Signs: BP: 112/72 (01/02 1111) Pulse Rate: 61 (01/02 1111)  Labs: Recent Labs    08/26/17 2051 08/26/17 2210 08/27/17 0130 08/27/17 0502 08/27/17 0503  HGB 10.9*  --   --   --  10.7*  HCT 34.5*  --   --   --  32.6*  PLT 321  --   --   --  279  APTT  --   --  33  --   --   LABPROT  --   --  14.5  --   --   INR  --   --  1.14  --   --   CREATININE  --  0.92  --   --  0.91  TROPONINI  --   --   --  0.61*  --     Estimated Creatinine Clearance: 78.1 mL/min (by C-G formula based on SCr of 0.91 mg/dL).   Medications:  Infusions:  . heparin      Assessment: 25 yoM presented to ED on the evening of 08/26/17 with SOB, diarrhea, distended abdomen.  PMH significant for Afib, CVA, DM, HTN, CAD, diverticulitis, and PE (2015).  Patient noted to have bloody stool, which has been determined to be resulting from hemorrhoids.  No oral anticoagulation PTA - he was previously on Eliquis, but this was stopped (sometime between 03/2017 admission and 06/2017 admission) due to GI bleeding and falls; he remains on aspirin 326m.  Pharmacy consulted to dose IV heparin for NSTEMI.   Heparin level initially due at 10:00, but not drawn per RN  STAT heparin level re-ordered is subtherapeutic at 0.17 on  heparin at 10 ml/hr  CBC: Hgb 10.7 is low/stable, Plt remain WNL  No bleeding or IV complications reported by RN.  Heme + stools noted.  Per cardiology, stop heparin if his GI bleeding worsens.  GI has been consulted.  Goal of Therapy:  Heparin level 0.3-0.7 units/ml Monitor platelets by anticoagulation protocol: Yes   Plan:   Give heparin 2500 units bolus IV x 1  Increase to heparin IV infusion at 1300 units/hr  Heparin level 8 hours after rate change  Daily heparin level and CBC  [facility] for [facility].   CGretta ArabPharmD, BCPS Pager [phone]/09/2017 11:37 AM

## 2017-08-27 NOTE — H&P (Signed)
[facility] History and Physical  [name]:[id number] DOB: [birth year] DOA: 08/26/2017  Referring physician: Dr Johnney Killian PCP: Bernell List, MD   Chief Complaint: SOB, diarrhea, distended abdomen  HPI: [name] is a 72 y.o. male with hx of depression, past hx etoh abuse, DM, CAD sp CABG, HL, HTN, seizure d/o, OSA and CVA, memory impairment, who presented to ED c/o SOB, diarrhea and distended abdomen.  In ED CXR showed +early CHF and CT abd showed also IS edema.  CT was done for distended abdomen and plain film showing distended SB, but on CT abd the SB was normal.  Trop was high at 0.34.  Cardiology was called by ED MD and they suggested IV heparin for poss nstemi and admit patient to [facility].   Pt had a CVA about 2-3 yrs ago and has had memory issues since then.  Had L hemiparesis which has mostly resolved w/ PT , speech is ok.  Pt requires care at home by his wife.  Wife gives history that he c/o SOB the last 2-3 days, she has noticed his legs are swollen x 1 week and he has gained weight  From 204 > 210 lbs in the last 2 days.  No chest pain, no fever, no prod cough, no abd pain.  He does have a habit of gulping/ swallowing air.    Main chronic issue is diarrhea following near total colectomy, also hx Crohn's. Per the wife they are getting ready to do a colostomy because "nothing else is working".  He was admitted recently in November for 2 days for rectal bleeding. Hb was stable, was not actively bleeding.  Seen by GI, they rec'd do continue asa and f/u in GI clinic with their GI doctor Dr [name].  Stool cdif was neg and CT abd/ pelv was negative.    Chart: -jan 2004 > diverticulitis, subtotal colectomy -may 2005 > unstable angina, severe 3V CAD > underwent CABG x 4 -oct 2006 > AMS prob post-ictal vs meds (remeron). remeron dc'd, cont'd on lamictal, MS returned to [place].  -feb 2008 > diarrhea, n/v and hypotension, dx was viral GE. AKI creat 4 peak, improved. Hx of Crohn's  also.  Had UTI grew Ecoli.  -march 2008 >  chronic diarrhea, altered mental status/delirium, dehydration and acute renal failure, seizure disorder, hypertension, B12 deficiency -aug 2013 > syncope , likely due to seizure activity, hx sz d/o, bite marks on tongue. Hx etoh abuse but didn't require rx in hospital.   -mar 2015 > subacute stroke, admitted after a fall resulting from knee pain and leg weakness. CT showed subacute CVA found incidentally on head CT and was admitted for this. Work-up was negative for embolic source. He received a TEE and an implantable loop recorder to help determine whether an arrhythmia might have lead to his CVA.  -aug 2015 > flank pain , hematuria, nephrolithiasis w/ stone > 6 mm. Rx w/ ureteroscopy , stone retrieval and stent placement. hypoxic , pleural plaques c/w asbestosis.  DC'd on home O2 presumed due to asbestosis. -sept 2015 > saw pulm MD in f/u after sent home with home O2, they did CT angiogram which showed pulm embolism.  Started on coumadin Rx w/ hep overlap.  Did not require home O2 this time.  -jun 2016 > dysarthria, gen'd weak, slurred speech. MRI revealed a chronic right ACA infarct in the right frontal lobe with a small area of acute infarct in the right frontal cortex over the convexity  compatible with extension of infarction. ICA's were patient, speech improved to baseline at time of dc.  Cont asa 40m + eliquis.   -aug 2018 > hx CVA 2016, hx PE 2015 still on eliquis, admitted w/ gen'd weakness.  Fell 3 times. Poor memory.  Diagnosis was gen weak and recur falls due to hx CVA w/ residual deficit, deconditioning.  Seen by PT they rec'd home PT/OT and DME O2 w ambulation and while stationary.  Chron resp failure w/ hypoxia > dc' d home to be on continuous oxygen supplementation, around 3 lpm.   -nov 2018 > blood per rectum, loose BM for a week.  No n/v, abd pain.  Seen by GI. CT abd showed no sign of colitis/ diverticulitis, Cdif on stool was neg.  May need OP  colon or flex sig.  Sent home on usual home O2 3L.         ROS  denies CP  no joint pain   no HA  no blurry vision  no rash  no diarrhea  no nausea/ vomiting  no dysuria  no difficulty voiding  no change in urine color    Past Medical History  Past Medical History:  Diagnosis Date  . [place]    a. 6/14: resolved after d/c ARB;  b. RA U/S 7/14: no RA stenosis  . Arthritis    of spine  . [place]rial flutter ([place]   . Back pain    persistent  . Coronary artery disease    a. Low level exercise Lex MV 3/14: low risk, EF 62%, inf defect 2/2 diaph attenuation vs artifiact, small area of scar possible, no ischemia  . Depression   . Diabetes mellitus    controled by diet  . Diverticulitis   . GERD (gastroesophageal reflux disease)   . H/O alcohol abuse   . Headache(784.0)   . Hearing loss   . History of atrial flutter   . History of hiatal hernia   . History of kidney stones   . Hx of echocardiogram    Echo 7/14:  Mod LVH, EF 55-60%, Gr 1 DD, mild MR, PASP 36  . Hyperlipidemia   . Hypertension   . Renal stone 04/15/2014  . Seizures ([place]   . Shortness of breath   . Sleep apnea    does not use CPAP  . Stroke ([facility]    Past Surgical History  Past Surgical History:  Procedure Laterality Date  . APPENDECTOMY    . BACK SURGERY    . CARDIAC CATHETERIZATION    . CERVICAL FUSION    . CORONARY ARTERY BYPASS GRAFT  2005   LIMA graft to LAD,saphenous vein graft to diag.,circumflex, marginal,and to the RCA  . CYSTOSCOPY/RETROGRADE/URETEROSCOPY/STONE EXTRACTION WITH BASKET Left 04/16/2014   Procedure: CYSTOSCOPY/RETROGRADE/LEFT URETEROSCOPY/STONE EXTRACTION WITH BASKET AND LASER LITHOTRIPSY;  Surgeon: JMalka So MD;  Location: [facility];  Service: Urology;  Laterality: Left;  With STENT  . ESOPHAGOGASTRODUODENOSCOPY  02/10/2012   Procedure: ESOPHAGOGASTRODUODENOSCOPY (EGD);  Surgeon: RCleotis Nipper MD;  [facility];  Service: Endoscopy;   Laterality: N/A;  . ESOPHAGOGASTRODUODENOSCOPY  06/29/2012   Procedure: ESOPHAGOGASTRODUODENOSCOPY (EGD);  Surgeon: JWinfield Cunas, MD;  [facility];  Service: Endoscopy;  Laterality: N/A;  . FOREIGN BODY REMOVAL  02/10/2012   Procedure: FOREIGN BODY REMOVAL;  Surgeon: RCleotis Nipper MD;  [place]  Service: Endoscopy;  Laterality: N/A;  . FOREIGN BODY REMOVAL  06/29/2012   Procedure:  FOREIGN BODY REMOVAL;  Surgeon: Winfield Cunas., MD;  [facility];  Service: Endoscopy;  Laterality: N/A;  . LEFT HEART CATHETERIZATION WITH CORONARY ANGIOGRAM N/A 04/02/2012   Procedure: LEFT HEART CATHETERIZATION WITH CORONARY ANGIOGRAM;  Surgeon: Sinclair Grooms, MD;  Location: [facility] CATH LAB;  Service: Cardiovascular;  Laterality: N/A;  . LOOP RECORDER IMPLANT N/A 11/17/2013   Procedure: LOOP RECORDER IMPLANT;  Surgeon: Deboraha Sprang, MD;  Location: [facility] CATH LAB;  Service: Cardiovascular;  Laterality: N/A;  . ORCHIECTOMY    . PARTIAL COLECTOMY     for diverticuli  . SAVORY DILATION  06/29/2012   Procedure: SAVORY DILATION;  Surgeon: Winfield Cunas., MD;  [facility];  Service: Endoscopy;  Laterality: N/A;  . TEE WITHOUT CARDIOVERSION N/A 11/17/2013   Procedure: TRANSESOPHAGEAL ECHOCARDIOGRAM (TEE);  Surgeon: Larey Dresser, MD;  Location: [facility] ENDOSCOPY;  Service: Cardiovascular;  Laterality: N/A;  . UMBILICAL HERNIA REPAIR     Family History  Family History  Problem Relation Age of Onset  . Emphysema Mother        was a smoker  . Asthma Mother   . Hypertension Mother   . Heart disease Father   . Prostate cancer Paternal Grandfather   . Pancreatic cancer Paternal Uncle   . Stroke Paternal Grandmother   . Heart attack Neg Hx    Social History  reports that  has never smoked. he has never used smokeless tobacco. He reports that he does not drink alcohol or use drugs. Allergies  Allergies  Allergen Reactions  . Cephalexin     Unknown  .  Doxycycline     Unknown  . Keflex [Cephalexin]   . Lac Bovis Other (See Comments)    lactose intolerant  . Pentazocine Lactate     He passed out- he had a seizure.  This occurred around 2000  . Talwin [Pentazocine] Other (See Comments)    hallucinations  . Tape Other (See Comments)    Paper tape only please.  . Zanaflex [Tizanidine Hcl] Other (See Comments)    Lightheaded and dizzy   Home medications Prior to Admission medications   Medication Sig Start Date End Date Taking? Authorizing Provider  acetaminophen (TYLENOL) 325 MG tablet Take 650 mg by mouth every 6 (six) hours as needed for moderate pain.   Yes [provider]  ARIPiprazole (ABILIFY) 5 MG tablet Take 5 mg by mouth daily.    Yes [provider]  aspirin 325 MG EC tablet Take 325 mg daily by mouth.   Yes [provider]  atorvastatin (LIPITOR) 80 MG tablet Take 1 tablet (80 mg total) by mouth daily. Patient taking differently: Take 80 mg by mouth at bedtime.  10/29/13  Yes Copland, Gay Filler, MD  camphor-menthol [facility]) lotion Apply 1 application topically daily. To entire body   Yes [provider]  diphenoxylate-atropine (LOMOTIL) 2.5-0.025 MG tablet Take 2 tablets by mouth every 6 (six) hours.    Yes [provider]  donepezil (ARICEPT ODT) 10 MG disintegrating tablet Take 10 mg by mouth daily.    Yes [provider]  Fluocinolone Acetonide 0.01 % OIL Apply 1 application topically daily as needed (itching). Apply to scalp   Yes [provider]  FLUoxetine (PROZAC) 20 MG capsule Take 40 mg by mouth daily.   Yes [provider]  fluticasone (FLONASE) 50 MCG/ACT nasal spray Place 1 spray into both nostrils 2 (two) times daily as needed for rhinitis.  Yes [provider]  furosemide (LASIX) 20 MG tablet TAKE 1-2 TABLETS BY MOUTH EVERY DAY AS NEEDED Patient taking differently: 20 mg mg DAILY in the morning (4am) 10/24/14  Yes Martinique, Peter M, MD   glipiZIDE (GLUCOTROL) 5 MG tablet Take 2.5 mg by mouth 2 (two) times daily before a meal. 30 minutes prior to meal   Yes [provider]  lamoTRIgine (LAMICTAL) 200 MG tablet Take 100 mg by mouth 2 (two) times daily.   Yes [provider]  Multiple Vitamin (MULTIVITAMIN) capsule Take 1 capsule by mouth daily.   Yes [provider]  ONE TOUCH ULTRA TEST test strip USE TO TEST BLOOD SUGAR DAILY 07/11/15  Yes Daub, Loura Back, MD  potassium citrate (UROCIT-K) 10 MEQ (1080 MG) SR tablet Take 10 mEq 2 (two) times daily by mouth.  07/08/14  Yes [provider]  traZODone (DESYREL) 100 MG tablet Take 100 mg by mouth at bedtime.    Yes [provider]  Triamcinolone Acetonide (TRIAMCINOLONE 0.1 % CREAM : EUCERIN) CREA Apply 1 application topically daily. TRIAMCINOLONE 1%/ Absorbase   Yes [provider]  lamoTRIgine (LAMICTAL) 100 MG tablet Take 1 tablet (100 mg total) by mouth 2 (two) times daily. Patient not taking: Reported on 08/26/2017 10/03/15   Rosalin Hawking, MD  nitroGLYCERIN (NITROSTAT) 0.4 MG SL tablet Place 1 tablet (0.4 mg total) under the tongue every 5 (five) minutes as needed. For chest pain 03/10/15   Martinique, Peter M, MD  omeprazole (PRILOSEC) 40 MG capsule Take 1 capsule (40 mg total) daily by mouth. Patient not taking: Reported on 08/26/2017 07/08/17   Rosita Fire, MD   Liver Function Tests Recent Labs  Lab 08/26/17 2210  AST 59*  ALT 84*  ALKPHOS 138*  BILITOT 0.7  PROT 6.9  ALBUMIN 3.2*   Recent Labs  Lab 08/26/17 2210  LIPASE 77*   CBC Recent Labs  Lab 08/26/17 2051  WBC 13.7*  HGB 10.9*  HCT 34.5*  MCV 92.0  PLT 024   Basic Metabolic Panel Recent Labs  Lab 08/26/17 2210  NA 136  K 4.5  CL 103  CO2 25  GLUCOSE 104*  BUN 13  CREATININE 0.92  CALCIUM 8.5*     Vitals:   08/26/17 2215 08/26/17 2230 08/26/17 2254 08/26/17 2300  BP: (!) 152/87 136/79 136/79 127/71  Pulse: 88 80 81 80  Resp: 20 19  (!) 21 (!) 21  Temp:      TempSrc:      SpO2: 94% 96% 95% 93%  Weight:      Height:       Exam: Gen pleasant elderly WM, no distress No rash, cyanosis or gangrene Sclera anicteric, throat clear  +JVD Chest faint basilar rales, no wheezing RRR no MRG  Abd soft distened, tympanitic, ^'d BS, no rebound GU normal male MS no joint effusions or deformity Ext 2+ sig bilat pretib pitting edema below the knees / no wounds or ulcers Neuro is alert, Ox 3 , minimal L sided weakness Knows the date, not the president    Home meds: -aripiprazole 5 qd/ donepezil 10 qd/ fluoxetine 40 qd/ trazodone 100 hs/ lamotrigine 100 bid -ecasa/ lipitor 80/ prilosec 40 qd -lasix 20 qam/ K citrate 10 bid -prn sl NTG/ lomotil/ sarna lotion/ tylenol -glipizide 2.5 bid   Na 136  K 4.5 BUN 13  Cr 0.92  WBC 13k  Hb 10.9  plt 321 UA negative FOB was +  EKG (  independ reviewed) > NSR, prob LAE, old ant MI, nonspecific repol abnormality CXR (independ reviewed) > see above   Assessment: 1. Dyspnea - due to CHF, new onset for this patient.  Hx CABG 2005.  Sig vol overload, not in distress.  Plan IV lasix, echo, card eval.  2. +trop - in setting of decomp CHF, hx CABG.  Per cardiology will start on IV heparin and [facility].  Had recent rectal bleeding admit in November but no w/u was done since he wasn't actively bleeding.  Watch closely.  3. Hx CVA - in 2016, memory issues, minimal L hemiparesis 4. Chronic diarrhea - hx Crohn's and then had almost total colectomy in 2004 for recurrent and severe diverticulitis/ diverticulosis +/- IBD.  They are considering having colostomy done for this.   5. DM 2 - on po meds x 1 only  6. Dementia - subsequent to CVA 7. Depression - cont meds 8. Seizure d/o - cont lamictal   Plan - as above       [place] D [facility] Pager [phone]   If 7PM-7AM, please contact night-coverage [email or web address] Password [facility] 08/27/2017, 12:17 AM

## 2017-08-27 NOTE — Progress Notes (Signed)
ANTICOAGULATION CONSULT NOTE - Follow Up Consult  Pharmacy Consult for Heparin Indication: chest pain/ACS  Allergies  Allergen Reactions  . Cephalexin     Unknown  . Doxycycline     Unknown  . Keflex [Cephalexin]   . Lac Bovis Other (See Comments)    lactose intolerant  . Pentazocine Lactate     He passed out- he had a seizure.  This occurred around 2000  . Talwin [Pentazocine] Other (See Comments)    hallucinations  . Tape Other (See Comments)    Paper tape only please.  Freddi Starr [Tizanidine Hcl] Other (See Comments)    Lightheaded and dizzy    Patient Measurements: Height: 5' 6"  (167.6 cm) Weight: 210 lb (95.3 kg) IBW/kg (Calculated) : 63.8 Heparin Dosing Weight: 84.4 kg  Vital Signs: Temp: 97.6 F (36.4 C) (01/02 1921) Temp Source: Oral (01/02 1921) BP: 116/60 (01/02 1921) Pulse Rate: 73 (01/02 1921)  Labs: Recent Labs    08/26/17 2051 08/26/17 2210 08/27/17 0130 08/27/17 0502 08/27/17 0503 08/27/17 1150 08/27/17 2208  HGB 10.9*  --   --   --  10.7*  --   --   HCT 34.5*  --   --   --  32.6*  --   --   PLT 321  --   --   --  279  --   --   APTT  --   --  33  --   --   --   --   LABPROT  --   --  14.5  --   --   --   --   INR  --   --  1.14  --   --   --   --   HEPARINUNFRC  --   --   --   --   --  0.17* 0.59  CREATININE  --  0.92  --   --  0.91  --   --   TROPONINI  --   --   --  0.61*  --   --   --     Estimated Creatinine Clearance: 78.1 mL/min (by C-G formula based on SCr of 0.91 mg/dL).   Medications:  Infusions:  . heparin 1,300 Units/hr (08/27/17 2218)    Assessment: 20 yoM presented to ED on the evening of 08/26/17 with SOB, diarrhea, distended abdomen.  PMH significant for Afib, CVA, DM, HTN, CAD, diverticulitis, and PE (2015).  Patient noted to have bloody stool, which has been determined to be resulting from hemorrhoids.  No oral anticoagulation PTA - he was previously on Eliquis, but this was stopped (sometime between 03/2017 admission  and 06/2017 admission) due to GI bleeding and falls; he remains on aspirin 356m.  Pharmacy consulted to dose IV heparin for NSTEMI.  Heparin level tonight after rate increase is therapeutic x 1  Goal of Therapy:  Heparin level 0.3-0.7 units/ml Monitor platelets by anticoagulation protocol: Yes   Plan:  Cont heparin at 1300 units/hr Confirmatory heparin level with AM labs  JNarda Bonds PharmD, BLurayPharmacist Phone: [phone]

## 2017-08-27 NOTE — Progress Notes (Signed)
ANTICOAGULATION CONSULT NOTE - Initial Consult  Pharmacy Consult for Heparin Indication: chest pain/ACS  Allergies  Allergen Reactions  . Cephalexin     Unknown  . Doxycycline     Unknown  . Keflex [Cephalexin]   . Lac Bovis Other (See Comments)    lactose intolerant  . Pentazocine Lactate     He passed out- he had a seizure.  This occurred around 2000  . Talwin [Pentazocine] Other (See Comments)    hallucinations  . Tape Other (See Comments)    Paper tape only please.  Freddi Starr [Tizanidine Hcl] Other (See Comments)    Lightheaded and dizzy    Patient Measurements: Height: 5' 6"  (167.6 cm) Weight: 210 lb (95.3 kg) IBW/kg (Calculated) : 63.8 Heparin Dosing Weight: 84 kg  Vital Signs: Temp: 97.8 F (36.6 C) (01/01 2029) Temp Source: Oral (01/01 2029) BP: 119/66 (01/02 0105) Pulse Rate: 75 (01/02 0105)  Labs: Recent Labs    08/26/17 2051 08/26/17 2210  HGB 10.9*  --   HCT 34.5*  --   PLT 321  --   CREATININE  --  0.92    Estimated Creatinine Clearance: 77.3 mL/min (by C-G formula based on SCr of 0.92 mg/dL).   Medical History: Past Medical History:  Diagnosis Date  . AKI (acute kidney injury) ([place])    a. 6/14: resolved after d/c ARB;  b. RA U/S 7/14: no RA stenosis  . Arthritis    of spine  . Atrial flutter ([place])   . Back pain    persistent  . Coronary artery disease    a. Low level exercise Lex MV 3/14: low risk, EF 62%, inf defect 2/2 diaph attenuation vs artifiact, small area of scar possible, no ischemia  . Depression   . Diabetes mellitus    controled by diet  . Diverticulitis   . GERD (gastroesophageal reflux disease)   . H/O alcohol abuse   . Headache(784.0)   . Hearing loss   . History of atrial flutter   . History of hiatal hernia   . History of kidney stones   . Hx of echocardiogram    Echo 7/14:  Mod LVH, EF 55-60%, Gr 1 DD, mild MR, PASP 36  . Hyperlipidemia   . Hypertension   . Renal stone 04/15/2014  . Seizures ([place])   .  Shortness of breath   . Sleep apnea    does not use CPAP  . Stroke [facility])     Assessment:  9yrmale with SOB, diarrhea and distended abdomen  PMH significant for AFib, CVA, DM, HTN, CAD, Diverticulitis  Noted patient with bloody stool, which has been determined to be resulting from hemorrhoids.  On no oral anticoagulation PTA  Pharmacy consulted to dose IV heparin for possible NSTEMI  Spoke with Dr PJohnney Killianwho confirmed ok to give heparin bolus (will give a slightly smaller bolus)  Goal of Therapy:  Heparin level 0.3-0.7 units/ml Monitor platelets by anticoagulation protocol: Yes   Plan:   Obtain baseline aPTT and INR  Heparin 3000 unit IV bolus x 1 followed by heparin infusion @ 1000 units/hr  Check heparin level 8 hrs after heparin started  Follow heparin level and CBC daily while on heparin   Fitzhugh Vizcarrondo, LToribio Harbour PharmD 08/27/2017,1:15 AM

## 2017-08-27 NOTE — ED Notes (Signed)
[facility] transported patient

## 2017-08-27 NOTE — ED Notes (Signed)
Pt transported to CT ?

## 2017-08-27 NOTE — ED Notes (Signed)
Pt moved from ER stretcher to hospital bed while awaiting for room assignment at [facility].

## 2017-08-27 NOTE — Consult Note (Signed)
[facility] CONSULT Reason for consult: Chronic diarrhea Referring Physician: [facility] hospitalist.  PCP: Dr.Guha at the [place] I believe in [place].  Primary GI: Dr. Laurence Spates  [name] is an 72 y.o. male.  HPI: The patient is well-known to me I have been seeing him for several years.  He currently came to the emergency room and has been found to have a possible N STEMI and is to be transferred to [facility].  He has a history of CAD with prior CABG in 2005.  He has had various anticoagulants and antiplatelet agents in the past but has had some GI bleeding and has not been on these routinely.  His other problems include congestive heart failure, history of stroke in the past, hypertension, history of transient A. fib, glucose intolerance, history of depression, history of seizures possibly due to alcohol abuse in the past, history of OSA treated with CPAP in the past but apparently could not tolerate the CPAP.  He is also had glucose intolerance and has been on medications in the past for diet control.  He also has had a history of pulmonary embolus requiring anticoagulation in the past it was felt to be due to transient atrial fibrillation.  He apparently has been seen by neurology here in [place] as well as at the [place] and is felt to have early Alzheimer's dementia.  This is a summary of his non-GI problems.  I have been seeing him for significant GI issues for over 10 years.  These apparently started after he required an emergency subtotal colectomy by Dr. Excell Seltzer in 2006 due to perforated diverticulitis.  He subsequently developed chronic diarrhea and has had chronic diarrhea since that time.  He actually has had Campylobacter cultured from his stool in the past which was treated and has been treated for small bowel bacterial overgrowth due to short bowel syndrome on multiple occasions most recently was on rifaximin but also with Flagyl in the past.  He has had stool studies  done in the past negative for C. difficile and enteric pathogens.  He has had several sigmoidoscopies with normal biopsies of the colon.  He has had episodes of rectal bleeding and the last sigmoidoscopy performed by me was in 2011.  He has approximately 25 cm of colon remaining.  There were no significant abnormalities other than some hemorrhoids.  He has been empirically treated with Imodium, Lomotil, cholestyramine as well as the various antibiotics for bacterial overgrowth without a great deal of improvement and has continued to have 10-12 loose bowel movements a day. His other GI problem has been vague dysphagia, hiccups, burping.  His last EGD was 2013.  He had an empiric balloon dilatation to 19 mm.  Previous barium swallow had shown very poor esophageal motility but a 13 mm barium tablet did pass the GE junction without difficulty.  There was some mild gastritis.  He has had chronic reflux symptoms and my belief is that he has been on PPI therapy chronically.  In 2014 he was referred to [facility] gastroenterology department for another opinion regarding all of his GI problems.  All of his records here were reviewed.  Summary report from them indicates that the diagnosis of chronic diarrhea secondary to short bowel syndrome was confirmed as well as esophageal dyskinesia.  He was tried on a multitude of medications including Thorazine, baclofen, various colonic antispasmodics, Imodium, Lomotil and probably other narcotic medicines in attempt to control his symptoms.  He was  seen and evaluated by pulmonary over at [facility] for a number of chest symptoms and according to the GI note no significant problems were found.  I saw him in the office within the past several weeks primarily with complaints of diarrhea.  Despite the history that he had a bowel movement every 2 hours the stool studies that we ordered were unable to be obtained.  He called back complaining of dark stools and was sent to  the emergency room and was evaluated there with a hemoglobin of 11.8, guaiac negative stools and was sent back home.  CT scan of the abdomen and pelvis obtained at that visit could not reveal any acute abnormalities.  CT was repeated yesterday showed some increased pleural fluid but no acute abdominal process.  When we saw him in the office a few weeks ago, it was obvious from talking to his wife that she was having more difficulty dealing with him and giving him his medications etc. we did briefly discuss the possibility of colostomy/ileostomy.  She felt that he was doing a very poor job of managing his diarrhea at this point this was primarily due to his progressive changes in mental status.   Past Medical History:  Diagnosis Date  . AKI (acute kidney injury) ([place])    a. 6/14: resolved after d/c ARB;  b. RA U/S 7/14: no RA stenosis  . Arthritis    of spine  . Atrial flutter ([place])    a. 2008 - one episode in setting of sepsis/shock.  . Back pain    persistent  . Coronary artery disease    a. s/p CABG 2005. b. low risk nuc 2016.  Marland Kitchen Depression   . Diabetes mellitus    controled by diet  . Diverticulitis    a. s/p partial colectomy remotely.  Marland Kitchen GERD (gastroesophageal reflux disease)   . H/O alcohol abuse   . Headache(784.0)   . Hearing loss   . History of hiatal hernia   . Hx of echocardiogram    Echo 7/14:  Mod LVH, EF 55-60%, Gr 1 DD, mild MR, PASP 36  . Hyperlipidemia   . Hypertension   . Pulmonary embolism ([place]) 2005  . Renal stone 04/15/2014  . Seizures ([place])   . Sleep apnea    does not use CPAP  . Stroke [facility])     Past Surgical History:  Procedure Laterality Date  . APPENDECTOMY    . BACK SURGERY    . CARDIAC CATHETERIZATION    . CERVICAL FUSION    . CORONARY ARTERY BYPASS GRAFT  2005   LIMA graft to LAD,saphenous vein graft to diag.,circumflex, marginal,and to the RCA  . CYSTOSCOPY/RETROGRADE/URETEROSCOPY/STONE EXTRACTION WITH BASKET Left 04/16/2014   Procedure:  CYSTOSCOPY/RETROGRADE/LEFT URETEROSCOPY/STONE EXTRACTION WITH BASKET AND LASER LITHOTRIPSY;  Surgeon: Malka So, MD;  Location: [facility] ORS;  Service: Urology;  Laterality: Left;  With STENT  . ESOPHAGOGASTRODUODENOSCOPY  02/10/2012   Procedure: ESOPHAGOGASTRODUODENOSCOPY (EGD);  Surgeon: Cleotis Nipper, MD;  Location: [facility] ENDOSCOPY;  Service: Endoscopy;  Laterality: N/A;  . ESOPHAGOGASTRODUODENOSCOPY  06/29/2012   Procedure: ESOPHAGOGASTRODUODENOSCOPY (EGD);  Surgeon: Winfield Cunas., MD;  Location: [facility] ENDOSCOPY;  Service: Endoscopy;  Laterality: N/A;  . FOREIGN BODY REMOVAL  02/10/2012   Procedure: FOREIGN BODY REMOVAL;  Surgeon: Cleotis Nipper, MD;  Location: [place];  Service: Endoscopy;  Laterality: N/A;  . FOREIGN BODY REMOVAL  06/29/2012   Procedure: FOREIGN BODY REMOVAL;  Surgeon: Winfield Cunas., MD;  Location: [facility] ENDOSCOPY;  Service: Endoscopy;  Laterality: N/A;  . LEFT HEART CATHETERIZATION WITH CORONARY ANGIOGRAM N/A 04/02/2012   Procedure: LEFT HEART CATHETERIZATION WITH CORONARY ANGIOGRAM;  Surgeon: Sinclair Grooms, MD;  Location: [facility] CATH LAB;  Service: Cardiovascular;  Laterality: N/A;  . LOOP RECORDER IMPLANT N/A 11/17/2013   Procedure: LOOP RECORDER IMPLANT;  Surgeon: Deboraha Sprang, MD;  Location: [facility] CATH LAB;  Service: Cardiovascular;  Laterality: N/A;  . ORCHIECTOMY    . PARTIAL COLECTOMY     for diverticuli  . SAVORY DILATION  06/29/2012   Procedure: SAVORY DILATION;  Surgeon: Winfield Cunas., MD;  Location: [facility] ENDOSCOPY;  Service: Endoscopy;  Laterality: N/A;  . TEE WITHOUT CARDIOVERSION N/A 11/17/2013   Procedure: TRANSESOPHAGEAL ECHOCARDIOGRAM (TEE);  Surgeon: Larey Dresser, MD;  Location: [facility] ENDOSCOPY;  Service: Cardiovascular;  Laterality: N/A;  . UMBILICAL HERNIA REPAIR      Family History  Problem Relation Age of Onset  . Emphysema Mother        was a smoker  . Asthma Mother   . Hypertension Mother   . Heart disease Father   . Prostate cancer  Paternal Grandfather   . Pancreatic cancer Paternal Uncle   . Stroke Paternal Grandmother   . Heart attack Neg Hx     Social History:  reports that  has never smoked. he has never used smokeless tobacco. He reports that he does not drink alcohol or use drugs.  Allergies:  Allergies  Allergen Reactions  . Cephalexin     Unknown  . Doxycycline     Unknown  . Keflex [Cephalexin]   . Lac Bovis Other (See Comments)    lactose intolerant  . Pentazocine Lactate     He passed out- he had a seizure.  This occurred around 2000  . Talwin [Pentazocine] Other (See Comments)    hallucinations  . Tape Other (See Comments)    Paper tape only please.  . Zanaflex [Tizanidine Hcl] Other (See Comments)    Lightheaded and dizzy    Medications; Prior to Admission medications   Medication Sig Start Date End Date Taking? Authorizing Provider  acetaminophen (TYLENOL) 325 MG tablet Take 650 mg by mouth every 6 (six) hours as needed for moderate pain.   Yes [provider]  ARIPiprazole (ABILIFY) 5 MG tablet Take 5 mg by mouth daily.    Yes [provider]  aspirin 325 MG EC tablet Take 325 mg daily by mouth.   Yes [provider]  atorvastatin (LIPITOR) 80 MG tablet Take 1 tablet (80 mg total) by mouth daily. Patient taking differently: Take 80 mg by mouth at bedtime.  10/29/13  Yes Copland, Gay Filler, MD  camphor-menthol [facility]) lotion Apply 1 application topically daily. To entire body   Yes [provider]  diphenoxylate-atropine (LOMOTIL) 2.5-0.025 MG tablet Take 2 tablets by mouth every 6 (six) hours.    Yes [provider]  donepezil (ARICEPT ODT) 10 MG disintegrating tablet Take 10 mg by mouth daily.    Yes [provider]  Fluocinolone Acetonide 0.01 % OIL Apply 1 application topically daily as needed (itching). Apply to scalp   Yes [provider]  FLUoxetine (PROZAC) 20 MG capsule Take 40 mg by mouth daily.   Yes [provider]  fluticasone (FLONASE) 50 MCG/ACT nasal spray Place 1 spray into both nostrils 2 (two) times daily as needed for rhinitis.    Yes [provider]  furosemide (LASIX)  20 MG tablet TAKE 1-2 TABLETS BY MOUTH EVERY DAY AS NEEDED Patient taking differently: 20 mg mg DAILY in the morning (4am) 10/24/14  Yes Martinique, Peter M, MD  glipiZIDE (GLUCOTROL) 5 MG tablet Take 2.5 mg by mouth 2 (two) times daily before a meal. 30 minutes prior to meal   Yes [provider]  lamoTRIgine (LAMICTAL) 200 MG tablet Take 100 mg by mouth 2 (two) times daily.   Yes [provider]  Multiple Vitamin (MULTIVITAMIN) capsule Take 1 capsule by mouth daily.   Yes [provider]  ONE TOUCH ULTRA TEST test strip USE TO TEST BLOOD SUGAR DAILY 07/11/15  Yes Daub, Loura Back, MD  potassium citrate (UROCIT-K) 10 MEQ (1080 MG) SR tablet Take 10 mEq 2 (two) times daily by mouth.  07/08/14  Yes [provider]  traZODone (DESYREL) 100 MG tablet Take 100 mg by mouth at bedtime.    Yes [provider]  Triamcinolone Acetonide (TRIAMCINOLONE 0.1 % CREAM : EUCERIN) CREA Apply 1 application topically daily. TRIAMCINOLONE 1%/ Absorbase   Yes [provider]  lamoTRIgine (LAMICTAL) 100 MG tablet Take 1 tablet (100 mg total) by mouth 2 (two) times daily. Patient not taking: Reported on 08/26/2017 10/03/15   Rosalin Hawking, MD  nitroGLYCERIN (NITROSTAT) 0.4 MG SL tablet Place 1 tablet (0.4 mg total) under the tongue every 5 (five) minutes as needed. For chest pain 03/10/15   Martinique, Peter M, MD  omeprazole (PRILOSEC) 40 MG capsule Take 1 capsule (40 mg total) daily by mouth. Patient not taking: Reported on 08/26/2017 07/08/17   Rosita Fire, MD   . ARIPiprazole  5 mg Oral Daily  . aspirin  324 mg Oral Once  . aspirin  325 mg Oral Daily  . atorvastatin  80 mg Oral QHS  . diphenoxylate-atropine  2 tablet Oral Q6H  . donepezil  10 mg Oral Daily  . FLUoxetine  40 mg Oral  Daily  . furosemide  40 mg Intravenous Q12H  . glipiZIDE  2.5 mg Oral BID AC  . insulin aspart  0-8 Units Subcutaneous Q4H  . lamoTRIgine  100 mg Oral BID  . multivitamin with minerals  1 tablet Oral Daily  . potassium citrate  10 mEq Oral BID  . traZODone  100 mg Oral QHS   PRN Meds acetaminophen, acetaminophen, nitroGLYCERIN, ondansetron (ZOFRAN) IV Results for orders placed or performed during the hospital encounter of 08/26/17 (from the past 48 hour(s))  CBC     Status: Abnormal   Collection Time: 08/26/17  8:51 PM  Result Value Ref Range   WBC 13.7 (H) 4.0 - 10.5 K/uL   RBC 3.75 (L) 4.22 - 5.81 MIL/uL   Hemoglobin 10.9 (L) 13.0 - 17.0 g/dL   HCT 34.5 (L) 39.0 - 52.0 %   MCV 92.0 78.0 - 100.0 fL   MCH 29.1 26.0 - 34.0 pg   MCHC 31.6 30.0 - 36.0 g/dL   RDW 15.2 11.5 - 15.5 %   Platelets 321 150 - 400 K/uL  Brain natriuretic peptide     Status: Abnormal   Collection Time: 08/26/17  8:51 PM  Result Value Ref Range   B Natriuretic Peptide 533.3 (H) 0.0 - 100.0 pg/mL  I-stat troponin, ED     Status: Abnormal   Collection Time: 08/26/17  9:01 PM  Result Value Ref Range   Troponin i, poc 0.34 (HH) 0.00 - 0.08 ng/mL   Comment NOTIFIED PHYSICIAN    Comment 3  Comment: Due to the release kinetics of cTnI, a negative result within the first hours of the onset of symptoms does not rule out myocardial infarction with certainty. If myocardial infarction is still suspected, repeat the test at appropriate intervals.   Hepatic function panel     Status: Abnormal   Collection Time: 08/26/17 10:10 PM  Result Value Ref Range   Total Protein 6.9 6.5 - 8.1 g/dL   Albumin 3.2 (L) 3.5 - 5.0 g/dL   AST 59 (H) 15 - 41 U/L   ALT 84 (H) 17 - 63 U/L   Alkaline Phosphatase 138 (H) 38 - 126 U/L   Total Bilirubin 0.7 0.3 - 1.2 mg/dL   Bilirubin, Direct 0.2 0.1 - 0.5 mg/dL   Indirect Bilirubin 0.5 0.3 - 0.9 mg/dL  Lipase, blood     Status: Abnormal   Collection Time: 08/26/17 10:10  PM  Result Value Ref Range   Lipase 77 (H) 11 - 51 U/L  Basic metabolic panel     Status: Abnormal   Collection Time: 08/26/17 10:10 PM  Result Value Ref Range   Sodium 136 135 - 145 mmol/L   Potassium 4.5 3.5 - 5.1 mmol/L   Chloride 103 101 - 111 mmol/L   CO2 25 22 - 32 mmol/L   Glucose, Bld 104 (H) 65 - 99 mg/dL   BUN 13 6 - 20 mg/dL   Creatinine, Ser 0.92 0.61 - 1.24 mg/dL   Calcium 8.5 (L) 8.9 - 10.3 mg/dL   GFR calc non Af Amer >60 >60 mL/min   GFR calc Af Amer >60 >60 mL/min    Comment: (NOTE) The eGFR has been calculated using the CKD EPI equation. This calculation has not been validated in all clinical situations. eGFR's persistently <60 mL/min signify possible Chronic Kidney Disease.    Anion gap 8 5 - 15  Urinalysis, Routine w reflex microscopic     Status: Abnormal   Collection Time: 08/26/17 10:18 PM  Result Value Ref Range   Color, Urine YELLOW YELLOW   APPearance CLEAR CLEAR   Specific Gravity, Urine 1.009 1.005 - 1.030   pH 5.0 5.0 - 8.0   Glucose, UA NEGATIVE NEGATIVE mg/dL   Hgb urine dipstick SMALL (A) NEGATIVE   Bilirubin Urine NEGATIVE NEGATIVE   Ketones, ur NEGATIVE NEGATIVE mg/dL   Protein, ur NEGATIVE NEGATIVE mg/dL   Nitrite NEGATIVE NEGATIVE   Leukocytes, UA NEGATIVE NEGATIVE   RBC / HPF 0-5 0 - 5 RBC/hpf   WBC, UA 0-5 0 - 5 WBC/hpf   Bacteria, UA RARE (A) NONE SEEN   Squamous Epithelial / LPF 0-5 (A) NONE SEEN   Mucus PRESENT   POC occult blood, ED Provider will collect     Status: Abnormal   Collection Time: 08/27/17  1:08 AM  Result Value Ref Range   Fecal Occult Bld POSITIVE (A) NEGATIVE  Protime-INR     Status: None   Collection Time: 08/27/17  1:30 AM  Result Value Ref Range   Prothrombin Time 14.5 11.4 - 15.2 seconds   INR 1.14   APTT     Status: None   Collection Time: 08/27/17  1:30 AM  Result Value Ref Range   aPTT 33 24 - 36 seconds  Troponin I     Status: Abnormal   Collection Time: 08/27/17  5:02 AM  Result Value Ref Range    Troponin I 0.61 (HH) <0.03 ng/mL    Comment: CRITICAL RESULT CALLED TO, READ BACK BY AND  VERIFIED WITH: COGGINS,I RN 1.2.19 @0558  ZANDO,C   D-dimer, quantitative (not at [facility])     Status: Abnormal   Collection Time: 08/27/17  5:02 AM  Result Value Ref Range   D-Dimer, Quant 0.99 (H) 0.00 - 0.50 ug/mL-FEU    Comment: (NOTE) At the manufacturer cut-off of 0.50 ug/mL FEU, this assay has been documented to exclude PE with a sensitivity and negative predictive value of 97 to 99%.  At this time, this assay has not been approved by the FDA to exclude DVT/VTE. Results should be correlated with clinical presentation.   TSH     Status: None   Collection Time: 08/27/17  5:02 AM  Result Value Ref Range   TSH 2.717 0.350 - 4.500 uIU/mL    Comment: Performed by a 3rd Generation assay with a functional sensitivity of <=0.01 uIU/mL.  CBC     Status: Abnormal   Collection Time: 08/27/17  5:03 AM  Result Value Ref Range   WBC 12.4 (H) 4.0 - 10.5 K/uL   RBC 3.54 (L) 4.22 - 5.81 MIL/uL   Hemoglobin 10.7 (L) 13.0 - 17.0 g/dL   HCT 32.6 (L) 39.0 - 52.0 %   MCV 92.1 78.0 - 100.0 fL   MCH 30.2 26.0 - 34.0 pg   MCHC 32.8 30.0 - 36.0 g/dL   RDW 15.2 11.5 - 15.5 %   Platelets 279 150 - 400 K/uL  Basic metabolic panel     Status: Abnormal   Collection Time: 08/27/17  5:03 AM  Result Value Ref Range   Sodium 139 135 - 145 mmol/L   Potassium 3.6 3.5 - 5.1 mmol/L    Comment: DELTA CHECK NOTED   Chloride 102 101 - 111 mmol/L   CO2 27 22 - 32 mmol/L   Glucose, Bld 105 (H) 65 - 99 mg/dL   BUN 12 6 - 20 mg/dL   Creatinine, Ser 0.91 0.61 - 1.24 mg/dL   Calcium 8.5 (L) 8.9 - 10.3 mg/dL   GFR calc non Af Amer >60 >60 mL/min   GFR calc Af Amer >60 >60 mL/min    Comment: (NOTE) The eGFR has been calculated using the CKD EPI equation. This calculation has not been validated in all clinical situations. eGFR's persistently <60 mL/min signify possible Chronic Kidney Disease.    Anion gap 10 5 - 15  CBG  monitoring, ED     Status: Abnormal   Collection Time: 08/27/17 10:21 AM  Result Value Ref Range   Glucose-Capillary 104 (H) 65 - 99 mg/dL    Ct Abdomen Pelvis Wo Contrast  Result Date: 08/27/2017 CLINICAL DATA:  Acute onset of generalized abdominal distention and diarrhea. Leukocytosis. Elevated lipase. Microhematuria. EXAM: CT ABDOMEN AND PELVIS WITHOUT CONTRAST TECHNIQUE: Multidetector CT imaging of the abdomen and pelvis was performed following the standard protocol without IV contrast. COMPARISON:  CT of the abdomen and pelvis from 07/06/2017 FINDINGS: Lower chest: Trace bilateral pleural fluid is noted, with interstitial prominence, likely reflecting mild interstitial edema. Scattered coronary artery calcifications are seen. The patient is status post median sternotomy. Hepatobiliary: The liver is unremarkable in appearance. The gallbladder is unremarkable in appearance. The common bile duct remains normal in caliber. Pancreas: The pancreas is within normal limits. Spleen: The spleen is unremarkable in appearance. Adrenals/Urinary Tract: The adrenal glands are unremarkable in appearance. The kidneys are within normal limits. There is no evidence of hydronephrosis. No renal or ureteral stones are identified. Nonspecific perinephric stranding and fluid is noted bilaterally. Stomach/Bowel: The patient  is status post resection of much of the colon. The ileocolic anastomosis is unremarkable. Remaining small bowel is unremarkable in appearance. The stomach is within normal limits. Vascular/Lymphatic: Diffuse calcification is seen along the abdominal aorta and its branches. The abdominal aorta is otherwise grossly unremarkable. The inferior vena cava is grossly unremarkable. No retroperitoneal lymphadenopathy is seen. No pelvic sidewall lymphadenopathy is identified. Reproductive: The bladder is mildly distended and grossly unremarkable. The prostate remains normal in size, with minimal calcification. A penile  implant is noted, with a right-sided pelvic reservoir. Other: No additional soft tissue abnormalities are seen. Musculoskeletal: No acute osseous abnormalities are identified. The patient is status post lumbosacral spinal fusion at L5-S1. The visualized musculature is unremarkable in appearance. IMPRESSION: 1. Trace bilateral pleural fluid, with interstitial prominence, likely reflecting mild interstitial edema. 2. Scattered coronary artery calcifications. 3. Ileocolic anastomosis is unremarkable in appearance. Aortic Atherosclerosis (ICD10-I70.0). Electronically Signed   By: Garald Balding M.D.   On: 08/27/2017 00:25   Dg Chest 2 View  Result Date: 08/26/2017 CLINICAL DATA:  Congestion, cough, swelling, and shortness of breath. History of diabetes. Nonsmoker. EXAM: CHEST  2 VIEW COMPARISON:  04/24/2017 FINDINGS: Postoperative changes in the mediastinum. Cardiac enlargement with pulmonary vascular congestion. Interstitial opacities in the lung bases likely represent edema. Small bilateral pleural effusions. No focal consolidation. No pneumothorax. Calcification of the aorta. Degenerative changes in the spine. IMPRESSION: Cardiac enlargement with pulmonary vascular congestion and mild interstitial edema. Small bilateral pleural effusions. Electronically Signed   By: Lucienne Capers M.D.   On: 08/26/2017 21:10   Dg Abdomen 1 View  Result Date: 08/26/2017 CLINICAL DATA:  Acute onset of shortness of breath. Diarrhea. Abdominal distention. EXAM: ABDOMEN - 1 VIEW COMPARISON:  CT of the abdomen and pelvis from 07/06/2017 FINDINGS: There is distention of small-bowel loops 4.2 cm in diameter, raising concern for partial obstruction or dysmotility. Residual air is seen along the descending colon. The stomach is partially distended with air. No free intra-abdominal air is identified, though evaluation for free air is limited on a single supine view. The patient is status post spinal fusion at L5-S1; the sacroiliac  joints are unremarkable in appearance. The visualized lung bases are essentially clear. IMPRESSION: Distention of small-bowel loops to 4.2 cm in diameter, raising concern for partial small bowel obstruction or dysmotility. Residual air seen within the descending colon. Electronically Signed   By: Garald Balding M.D.   On: 08/26/2017 22:09   ROS: Patient very poor historian but continues to complain of difficulty swallowing, hiccups, spitting up and diarrhea.  He has intermittent chest pain but no abdominal pain.  Otherwise he is unable to give me much of a review of systems.            Blood pressure (!) 110/58, pulse 70, temperature 97.8 F (36.6 C), temperature source Oral, resp. rate (!) 23, height 5' 6"  (1.676 m), weight 95.3 kg (210 lb), SpO2 95 %.  Physical exam:   General--pleasantly confused [place] male who does appear to be ill but is in no severe distress.  He is spitting into a emesis bag. ENT--nonicteric Neck--full range of motion no masses palpated Heart--regular rate and rhythm no murmurs heard Lungs--diminished breath sounds but no wheezing Abdomen--nondistended with good bowel sounds are actually somewhat hyperactive nontender Psych--alert but answers questions poorly   Assessment: 1.  Chronic diarrhea.  Due to short bowel syndrome probably worsened by chronic small bowel overgrowth 2.  Chronic dysphagia, hiccups, burping of unclear etiology.  Probably due to esophageal dyskinesia.  Extensive workup here and at [facility] has been negative 3.  NSTEMI to be transferred to [facility] for possible further workup. 4.  History of prior stroke and pulmonary embolus possibly connected to transient atrial fibrillation 5.  CAD status post CABG, history of chronic CHF 6.  History of seizures 7.  Probable dementia 8.  Status post subtotal colectomy, appendectomy, renal stone procedures, umbilical hernia repair.  Plan: 1.  We will follow the patient while he is here in the  hospital.  I would recommend doing more extensive stool testing to rule out any infectious or other cause of his diarrhea.  We will go ahead and order those studies now.   Nancy Fetter 08/27/2017, 12:01 PM   This note was created using voice recognition software and minor errors may Have occurred unintentionally. Pager: [phone] If no answer or after hours call [phone]

## 2017-08-27 NOTE — Consult Note (Signed)
Cardiology Consultation:   Patient ID: BRON SNELLINGS; [id number]; 09-Dec-1945   Admit date: 08/26/2017 Date of Consult: 08/27/2017  Primary Care Provider: Bernell List, MD Primary Cardiologist: Dr. Martinique Primary Electrophysiologist:  Dr. Caryl Comes (has not seen cardiology since 2017)  Chief Complaint: abdominal distention  Patient Profile:   [name] is a 72 y.o. male with a hx of CAD s/p CABGx4 12/2003, pulmonary embolism 04/2014, stroke, HTN, HLD, seizure, depression, alcohol abuse, DM, GERD, sleep apnea (does not use CPAP), mild carotid artery disease, chronic respiratory failure on home O2 with prior possible asbestos exposure, diverticulosis s/p partial colectomy, h/o AKI (With ARB -> normal renal dopplers 2014), recent GI issues and frequent falling who is being seen today for the evaluation of elevated troponin at the request of Dr. Jonnie Finner.  History of Present Illness:   In 2005 he underwent CABG x4 with LIMA-LAD, SVG-diagonal, SVG-Cx, and SVG-RCA. He has not had cath since then. In 10/2013 he was seen by EP team for stroke. Atrial flutter had historically been listed in his PMH although it is unclear where this diagnosis came from. (Dr. Martinique was later able to clarify this happened in 2008 with a single episode of atrial flutter in setting of acute sepsis/shock.) He underwent TEE/loop. He was hospitalized for kidney stone 03/2014 and one month later in 04/2017 was admitted for PE and was started on anticoagulation. Pulm notes previously indicated recommendation to continue indefinitely. Last echo 11/2014 showed EF 55-60%, grade 1 DD. In 01/2015 he was readmitted for slurred speech and fall with MRI revealing prior chronic infarct in right frontal lobe but small area of acute infarct in right frontal cortex over the convexity compatible with extension of infarction. Neurology was consulted and no new recommendations were made. He had been compliant with Eliquis at that time reportedly. Carotid  duplex 01/2015 showed 1-39% stenosis; MRA did demonstrate severe stenosis in left posterior cerebral artery unchanged from prior. Last nuclear stress test in 03/2015 was normal. Last cardiology OV 11/2015 at which time aspirin was stopped due to concomitant Eliquis and metoprolol was stopped due to low blood pressure.  He was admitted 04/2017 with frequent falling and generalized weakness. Sometime between then and next admission 06/2017, Eliquis was stopped. The patient does not recall specific details about stopping, but hospitalist notes last admission indicate this was for GI bleeding and falling. He had had interim admission at the [facility] for diarrhea. He was admitted to [facility] system 07/08/2017 with BRBPR and loose BM at which time workup was unremarkable, no evidence of diverticulitis by CT. It was felt he may need further colonoscopy/sigmoidoscopy as OP. He was seen back in the ED 07/22/17 with coffee ground diarrhea and black stool. Guaiac there was negative and Hgb was stable so he was discharged home with plan for OP GI follow-up. He presented back to [facility] ER overnight with SOB, frequent belching, persistent diarrhea, and distended abdomen. Abd plain film showed distention of small bowel loops but CT abd/pelvix was unremarkable, but otherwise imaging suggesting vascular congestion and small bilateral pleural effusions. WBC is 12.4, Hgb 10.7 (baseline 11 range earlier this year), troponin 0.34 (POC) then 0.61 (full set), AST 59, ALT 84, albumin 3.2, normal BUN/Cr, BNP 533, positive hemoccult. He is alone during interview and gives only brief short answers. He has felt more SOB for the last 2-3 days. Per intake, wife had reported legs had been swollen x 1 week with weight gain. The patient had not particularly noticed  this. Per wife, they were getting ready to do a colostomy because nothing else has been working. He's had persistent diarrhea x 3 years, with last BM this AM. He's not had any chest pain or  pressure or symptoms similar to angina in 2015.  IM admitting notes indicate he's had some dementia, likely accounting for his poor history giving.   Past Medical History:  Diagnosis Date  . AKI (acute kidney injury) ([place])    a. 6/14: resolved after d/c ARB;  b. RA U/S 7/14: no RA stenosis  . Arthritis    of spine  . Atrial flutter ([place])    a. 2008 - one episode in setting of sepsis/shock.  . Back pain    persistent  . Coronary artery disease    a. s/p CABG 2005. b. low risk nuc 2016.  Marland Kitchen Depression   . Diabetes mellitus    controled by diet  . Diverticulitis    a. s/p partial colectomy remotely.  Marland Kitchen GERD (gastroesophageal reflux disease)   . H/O alcohol abuse   . Headache(784.0)   . Hearing loss   . History of hiatal hernia   . Hx of echocardiogram    Echo 7/14:  Mod LVH, EF 55-60%, Gr 1 DD, mild MR, PASP 36  . Hyperlipidemia   . Hypertension   . Pulmonary embolism ([place]) 2005  . Renal stone 04/15/2014  . Seizures ([place])   . Sleep apnea    does not use CPAP  . Stroke [facility])     Past Surgical History:  Procedure Laterality Date  . APPENDECTOMY    . BACK SURGERY    . CARDIAC CATHETERIZATION    . CERVICAL FUSION    . CORONARY ARTERY BYPASS GRAFT  2005   LIMA graft to LAD,saphenous vein graft to diag.,circumflex, marginal,and to the RCA  . CYSTOSCOPY/RETROGRADE/URETEROSCOPY/STONE EXTRACTION WITH BASKET Left 04/16/2014   Procedure: CYSTOSCOPY/RETROGRADE/LEFT URETEROSCOPY/STONE EXTRACTION WITH BASKET AND LASER LITHOTRIPSY;  Surgeon: Malka So, MD;  Location: [facility] ORS;  Service: Urology;  Laterality: Left;  With STENT  . ESOPHAGOGASTRODUODENOSCOPY  02/10/2012   Procedure: ESOPHAGOGASTRODUODENOSCOPY (EGD);  Surgeon: Cleotis Nipper, MD;  Location: [facility] ENDOSCOPY;  Service: Endoscopy;  Laterality: N/A;  . ESOPHAGOGASTRODUODENOSCOPY  06/29/2012   Procedure: ESOPHAGOGASTRODUODENOSCOPY (EGD);  Surgeon: Winfield Cunas., MD;  Location: [facility] ENDOSCOPY;  Service: Endoscopy;  Laterality:  N/A;  . FOREIGN BODY REMOVAL  02/10/2012   Procedure: FOREIGN BODY REMOVAL;  Surgeon: Cleotis Nipper, MD;  Location: [place];  Service: Endoscopy;  Laterality: N/A;  . FOREIGN BODY REMOVAL  06/29/2012   Procedure: FOREIGN BODY REMOVAL;  Surgeon: Winfield Cunas., MD;  Location: [facility] ENDOSCOPY;  Service: Endoscopy;  Laterality: N/A;  . LEFT HEART CATHETERIZATION WITH CORONARY ANGIOGRAM N/A 04/02/2012   Procedure: LEFT HEART CATHETERIZATION WITH CORONARY ANGIOGRAM;  Surgeon: Sinclair Grooms, MD;  Location: [facility] CATH LAB;  Service: Cardiovascular;  Laterality: N/A;  . LOOP RECORDER IMPLANT N/A 11/17/2013   Procedure: LOOP RECORDER IMPLANT;  Surgeon: Deboraha Sprang, MD;  Location: [facility] CATH LAB;  Service: Cardiovascular;  Laterality: N/A;  . ORCHIECTOMY    . PARTIAL COLECTOMY     for diverticuli  . SAVORY DILATION  06/29/2012   Procedure: SAVORY DILATION;  Surgeon: Winfield Cunas., MD;  Location: [facility] ENDOSCOPY;  Service: Endoscopy;  Laterality: N/A;  . TEE WITHOUT CARDIOVERSION N/A 11/17/2013   Procedure: TRANSESOPHAGEAL ECHOCARDIOGRAM (TEE);  Surgeon: Larey Dresser, MD;  Location: [facility];  Service:  Cardiovascular;  Laterality: N/A;  . UMBILICAL HERNIA REPAIR       Inpatient Medications: Scheduled Meds: . ARIPiprazole  5 mg Oral Daily  . aspirin  324 mg Oral Once  . aspirin  325 mg Oral Daily  . atorvastatin  80 mg Oral QHS  . diphenoxylate-atropine  2 tablet Oral Q6H  . donepezil  10 mg Oral Daily  . FLUoxetine  40 mg Oral Daily  . furosemide  40 mg Intravenous Q12H  . glipiZIDE  2.5 mg Oral BID AC  . insulin aspart  0-8 Units Subcutaneous Q4H  . lamoTRIgine  100 mg Oral BID  . multivitamin with minerals  1 tablet Oral Daily  . potassium citrate  10 mEq Oral BID  . traZODone  100 mg Oral QHS   Continuous Infusions: . heparin 1,000 Units/hr (08/27/17 0144)   PRN Meds: acetaminophen, acetaminophen, nitroGLYCERIN, ondansetron (ZOFRAN) IV  Home Meds: Prior to Admission  medications   Medication Sig Start Date End Date Taking? Authorizing Provider  acetaminophen (TYLENOL) 325 MG tablet Take 650 mg by mouth every 6 (six) hours as needed for moderate pain.   Yes [provider]  ARIPiprazole (ABILIFY) 5 MG tablet Take 5 mg by mouth daily.    Yes [provider]  aspirin 325 MG EC tablet Take 325 mg daily by mouth.   Yes [provider]  atorvastatin (LIPITOR) 80 MG tablet Take 1 tablet (80 mg total) by mouth daily. Patient taking differently: Take 80 mg by mouth at bedtime.  10/29/13  Yes Copland, Gay Filler, MD  camphor-menthol [facility]) lotion Apply 1 application topically daily. To entire body   Yes [provider]  diphenoxylate-atropine (LOMOTIL) 2.5-0.025 MG tablet Take 2 tablets by mouth every 6 (six) hours.    Yes [provider]  donepezil (ARICEPT ODT) 10 MG disintegrating tablet Take 10 mg by mouth daily.    Yes [provider]  Fluocinolone Acetonide 0.01 % OIL Apply 1 application topically daily as needed (itching). Apply to scalp   Yes [provider]  FLUoxetine (PROZAC) 20 MG capsule Take 40 mg by mouth daily.   Yes [provider]  fluticasone (FLONASE) 50 MCG/ACT nasal spray Place 1 spray into both nostrils 2 (two) times daily as needed for rhinitis.    Yes [provider]  furosemide (LASIX) 20 MG tablet TAKE 1-2 TABLETS BY MOUTH EVERY DAY AS NEEDED Patient taking differently: 20 mg mg DAILY in the morning (4am) 10/24/14  Yes Martinique, Peter M, MD  glipiZIDE (GLUCOTROL) 5 MG tablet Take 2.5 mg by mouth 2 (two) times daily before a meal. 30 minutes prior to meal   Yes [provider]  lamoTRIgine (LAMICTAL) 200 MG tablet Take 100 mg by mouth 2 (two) times daily.   Yes [provider]  Multiple Vitamin (MULTIVITAMIN) capsule Take 1 capsule by mouth daily.   Yes [provider]  ONE TOUCH ULTRA TEST test strip USE TO TEST BLOOD SUGAR DAILY 07/11/15  Yes  Daub, Loura Back, MD  potassium citrate (UROCIT-K) 10 MEQ (1080 MG) SR tablet Take 10 mEq 2 (two) times daily by mouth.  07/08/14  Yes [provider]  traZODone (DESYREL) 100 MG tablet Take 100 mg by mouth at bedtime.    Yes [provider]  Triamcinolone Acetonide (TRIAMCINOLONE 0.1 % CREAM : EUCERIN) CREA Apply 1 application topically daily. TRIAMCINOLONE 1%/ Absorbase   Yes [provider]  lamoTRIgine (LAMICTAL) 100 MG tablet Take 1 tablet (  100 mg total) by mouth 2 (two) times daily. Patient not taking: Reported on 08/26/2017 10/03/15   Rosalin Hawking, MD  nitroGLYCERIN (NITROSTAT) 0.4 MG SL tablet Place 1 tablet (0.4 mg total) under the tongue every 5 (five) minutes as needed. For chest pain 03/10/15   Martinique, Peter M, MD  omeprazole (PRILOSEC) 40 MG capsule Take 1 capsule (40 mg total) daily by mouth. Patient not taking: Reported on 08/26/2017 07/08/17   Rosita Fire, MD    Allergies:    Allergies  Allergen Reactions  . Cephalexin     Unknown  . Doxycycline     Unknown  . Keflex [Cephalexin]   . Lac Bovis Other (See Comments)    lactose intolerant  . Pentazocine Lactate     He passed out- he had a seizure.  This occurred around 2000  . Talwin [Pentazocine] Other (See Comments)    hallucinations  . Tape Other (See Comments)    Paper tape only please.  Freddi Starr [Tizanidine Hcl] Other (See Comments)    Lightheaded and dizzy    Social History:   Social History   Socioeconomic History  . Marital status: Married    Spouse name: Clinical research associate  . Number of children: 2  . Years of education: college  . Highest education level: Not on file  Social Needs  . Financial resource strain: Not on file  . Food insecurity - worry: Not on file  . Food insecurity - inability: Not on file  . Transportation needs - medical: Not on file  . Transportation needs - non-medical: Not on file  Occupational History  . 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 Smoking status: Never Smoker  . Smokeless tobacco: Never Used  Substance and Sexual Activity  . Alcohol use: No    Comment: pt states "I used to but not anymore"  . Drug use: No  . Sexual activity: Not Currently  Other Topics Concern  . Not on file  Social History Narrative   Patient lives at home with  wife      Patient drinks 2 cups of coffee a day.patient is left handed    Family History:   The patient's family history includes Asthma in his mother; Emphysema in his mother; Heart disease in his father; Hypertension in his mother; Pancreatic cancer in his paternal uncle; Prostate cancer in his paternal grandfather; Stroke in his paternal grandmother. There is no history of Heart attack.  ROS:  Please see the history of present illness.  All other ROS reviewed and negative.     Physical Exam/Data:   Vitals:   08/27/17 0230 08/27/17 0300 08/27/17 0533 08/27/17 0714  BP: 121/68 102/65 118/63 108/60  Pulse: 74 66 67 72  Resp: (!) 23 17 19  (!) 22  Temp:      TempSrc:      SpO2: 97% 96% 97% 99%  Weight:      Height:        Intake/Output Summary (Last 24 hours) at 08/27/2017 0834 Last data filed at 08/27/2017 0008 Gross per 24 hour  Intake -  Output 1000 ml  Net -1000 ml   Filed Weights   08/26/17 2030 08/26/17 2104  Weight: 200 lb (90.7 kg) 210 lb (95.3 kg)   Body mass index is 33.89 kg/m.  General: Chronically ill appearing WM, in no acute distress. Head: Normocephalic, atraumatic, sclera non-icteric, no xanthomas, nares are without discharge.  Neck: Negative for carotid bruits. JVD not elevated. Lungs: Diminished BS at bases otherwise clear bilaterally to auscultation without wheezes, rales, or rhonchi. Breathing is unlabored. Heart: RRR with S1 S2. No murmurs, rubs, or gallops appreciated. Abdomen: Soft, non-tender, non-distended with normoactive bowel sounds. No hepatomegaly. No rebound/guarding. No obvious abdominal masses. Frequent  belching Msk:  Strength and tone appear normal for age. Extremities: No clubbing or cyanosis. 1+ pale pitting BLE edema up to knees.  Distal pedal pulses are 2+ and equal bilaterally. Neuro: Alert and oriented X 3. No facial asymmetry. No focal deficit. Moves all extremities spontaneously. Psych:  Responds to questions appropriately with a normal affect.  EKG:  The EKG was personally reviewed and demonstrates NSR 76bpm, diffuse ST depression in I, II, III, avF, V4-V6 with TWI I, II, V2, V4-V5. Accentuated from prior tracings.  Relevant CV Studies: Referenced above.  Laboratory Data:  Chemistry Recent Labs  Lab 08/26/17 2210 08/27/17 0503  NA 136 139  K 4.5 3.6  CL 103 102  CO2 25 27  GLUCOSE 104* 105*  BUN 13 12  CREATININE 0.92 0.91  CALCIUM 8.5* 8.5*  GFRNONAA >60 >60  GFRAA >60 >60  ANIONGAP 8 10    Recent Labs  Lab 08/26/17 2210  PROT 6.9  ALBUMIN 3.2*  AST 59*  ALT 84*  ALKPHOS 138*  BILITOT 0.7   Hematology Recent Labs  Lab 08/26/17 2051 08/27/17 0503  WBC 13.7* 12.4*  RBC 3.75* 3.54*  HGB 10.9* 10.7*  HCT 34.5* 32.6*  MCV 92.0 92.1  MCH 29.1 30.2  MCHC 31.6 32.8  RDW 15.2 15.2  PLT 321 279   Cardiac Enzymes Recent Labs  Lab 08/27/17 0502  TROPONINI 0.61*    Recent Labs  Lab 08/26/17 2101  TROPIPOC 0.34*    BNP Recent Labs  Lab 08/26/17 2051  BNP 533.3*    DDimer No results for input(s): DDIMER in the last 168 hours.  Radiology/Studies:  Ct Abdomen Pelvis Wo Contrast  Result Date: 08/27/2017 CLINICAL DATA:  Acute onset of generalized abdominal distention and diarrhea. Leukocytosis. Elevated lipase. Microhematuria. EXAM: CT ABDOMEN AND PELVIS WITHOUT CONTRAST TECHNIQUE: Multidetector CT imaging of the abdomen and pelvis was performed following the standard protocol without IV contrast. COMPARISON:  CT of the abdomen and pelvis from 07/06/2017 FINDINGS: Lower chest: Trace bilateral pleural fluid is noted, with interstitial prominence,  likely reflecting mild interstitial edema. Scattered coronary artery calcifications are seen. The patient is status post median sternotomy. Hepatobiliary: The liver is unremarkable in appearance. The gallbladder is unremarkable in appearance. The common bile duct remains normal in caliber. Pancreas: The pancreas is within normal limits. Spleen: The spleen is unremarkable in appearance. Adrenals/Urinary Tract: The adrenal glands are unremarkable in appearance. The kidneys are within normal limits. There is no evidence of hydronephrosis. No renal or ureteral stones are identified. Nonspecific perinephric stranding and fluid is noted bilaterally. Stomach/Bowel: The patient is status post resection of much of the colon. The ileocolic anastomosis is unremarkable. Remaining small bowel is unremarkable in appearance. The stomach is within normal limits. Vascular/Lymphatic: Diffuse calcification is seen along the abdominal aorta and its branches. The abdominal aorta is otherwise grossly unremarkable. The inferior vena cava is grossly unremarkable. No retroperitoneal lymphadenopathy is seen. No pelvic sidewall lymphadenopathy is identified. Reproductive: The bladder is mildly distended and grossly unremarkable. The prostate remains normal in size, with minimal calcification. A penile implant is noted, with a right-sided pelvic reservoir. Other: No additional soft tissue abnormalities are seen.  Musculoskeletal: No acute osseous abnormalities are identified. The patient is status post lumbosacral spinal fusion at L5-S1. The visualized musculature is unremarkable in appearance. IMPRESSION: 1. Trace bilateral pleural fluid, with interstitial prominence, likely reflecting mild interstitial edema. 2. Scattered coronary artery calcifications. 3. Ileocolic anastomosis is unremarkable in appearance. Aortic Atherosclerosis (ICD10-I70.0). Electronically Signed   By: Garald Balding M.D.   On: 08/27/2017 00:25   Dg Chest 2  View  Result Date: 08/26/2017 CLINICAL DATA:  Congestion, cough, swelling, and shortness of breath. History of diabetes. Nonsmoker. EXAM: CHEST  2 VIEW COMPARISON:  04/24/2017 FINDINGS: Postoperative changes in the mediastinum. Cardiac enlargement with pulmonary vascular congestion. Interstitial opacities in the lung bases likely represent edema. Small bilateral pleural effusions. No focal consolidation. No pneumothorax. Calcification of the aorta. Degenerative changes in the spine. IMPRESSION: Cardiac enlargement with pulmonary vascular congestion and mild interstitial edema. Small bilateral pleural effusions. Electronically Signed   By: Lucienne Capers M.D.   On: 08/26/2017 21:10   Dg Abdomen 1 View  Result Date: 08/26/2017 CLINICAL DATA:  Acute onset of shortness of breath. Diarrhea. Abdominal distention. EXAM: ABDOMEN - 1 VIEW COMPARISON:  CT of the abdomen and pelvis from 07/06/2017 FINDINGS: There is distention of small-bowel loops 4.2 cm in diameter, raising concern for partial obstruction or dysmotility. Residual air is seen along the descending colon. The stomach is partially distended with air. No free intra-abdominal air is identified, though evaluation for free air is limited on a single supine view. The patient is status post spinal fusion at L5-S1; the sacroiliac joints are unremarkable in appearance. The visualized lung bases are essentially clear. IMPRESSION: Distention of small-bowel loops to 4.2 cm in diameter, raising concern for partial small bowel obstruction or dysmotility. Residual air seen within the descending colon. Electronically Signed   By: Garald Balding M.D.   On: 08/26/2017 22:09    Assessment and Plan:   1. Continued diarrhea, abdominal distention, frequent belching and heme-positive stools with recent evidence of GI bleeding - would strongly consider GI consultation this admission to assist with definitive plans for management. While some of his symptoms can be explained  by volume overload, the overall clinical picture seems multifactorial with underlying GI disturbance as well. Will need to keep context of plan for recent GI issues including continued heme-positive stool this admission.  2. CAD s/p CABG 2005 with possible NSTEMI this admission - no cath since his bypass. Stress test 2016 normal. EKG is concerning for global ischemia. Clinical picture is muddled by recent plethora of GI issues. ED spoke with cardiology fellow who recommended to heparinize. He remains on full dose ASA for now (home dose). Note sometime this fall he was actually taken off Eliquis due to GI bleeding and falls, therefore he is not ideal candidate for dual-antiplatelet therapy either given GI issues. He's not had any chest pain. He's not on BB due to h/o low-normal blood pressure. Continue statin. I will review further evaluation with Dr. Acie Fredrickson.  3. Volume overload compatible with acute (suspected diastolic) CHF - he denies excess salt intake lately; drinks approx 2 qts (just under 2L) of water daily. Await echocardiogram. His edema also could be due in part to hypoalbuminemia. Agree with IV Lasix as planned. Given h/o PE in 2015, will also check d-dimer. If abnormal would start with lower extremity duplex to exclude DVT as he has had multiple hospitalizations lately, putting him at risk for such. Addendum: d-dimer elevated, will arrange duplex.  4. H/o stroke - he  is maintained on ASA instead of Eliquis for now. Loop recorder surveillance was previously de-activated in 2017 because he was already anticoagulated at that time and findings of AF would not have changed management.  5. HTN - blood pressure low normal. Will need to follow with diuresis.  6. H/o EtOH abuse per chart - pt denies recent use.   For questions or updates, please contact [place] Please consult [email or web address] for contact info under Cardiology/STEMI.    Signed, Charlie Pitter, PA-C  08/27/2017 8:34 AM    Attending Note:   The patient was seen and examined.  Agree with assessment and plan as noted above.  Changes made to the above note as needed.  Patient seen and independently examined with Melina Copa, PA .   We discussed all aspects of the encounter. I agree with the assessment and plan as stated above.  1.  Coronary artery disease: Patient presents with significant worsening of his GI issues.  He presents with a non-ST segment elevation myocardial infarction which appears to be secondary to his other underlying issues. He has lots of GI issues and is also having heme positive stools.  I would not want to commit him to dual antiplatelet therapy at this time.  I do not think that urgent heart catheterization is in his best interest. To new heparin for now.  We can stop heparin if his GI bleeding worsens.  2.  Elevated d-dimer.  Agree with lower extremity duplex scan.  He has been on Eliquis in the past and this was discontinued because of ongoing GI bleeding.  He is scheduled to transfer to come in hospital.  We will continue to follow him over there.   I have spent a total of 40 minutes with patient reviewing hospital  notes , telemetry, EKGs, labs and examining patient as well as establishing an assessment and plan that was discussed with the patient. > 50% of time was spent in direct patient care.    Thayer Headings, Brooke Bonito., MD, [facility] 08/27/2017, 10:20 AM [address]  [place] Pager [phone]

## 2017-08-27 NOTE — Plan of Care (Signed)
Pt oriented to unit, plan of care, and method of reporting concerns. Pt verbalizes understanding of education. Bed alarm implemented for patient safety

## 2017-08-27 NOTE — Progress Notes (Signed)
BLE venous duplex prelim: negative for DVT. Maahi Lannan Eunice, RDMS, RVT  

## 2017-08-27 NOTE — ED Notes (Signed)
Hospitalist at bedside 

## 2017-08-28 ENCOUNTER — Other Ambulatory Visit (HOSPITAL_COMMUNITY): Payer: Medicare Other

## 2017-08-28 DIAGNOSIS — L8990 Pressure ulcer of unspecified site, unspecified stage: Secondary | ICD-10-CM

## 2017-08-28 LAB — GLUCOSE, CAPILLARY
GLUCOSE-CAPILLARY: 101 mg/dL — AB (ref 65–99)
GLUCOSE-CAPILLARY: 103 mg/dL — AB (ref 65–99)
GLUCOSE-CAPILLARY: 124 mg/dL — AB (ref 65–99)
GLUCOSE-CAPILLARY: 97 mg/dL (ref 65–99)
Glucose-Capillary: 139 mg/dL — ABNORMAL HIGH (ref 65–99)
Glucose-Capillary: 111 mg/dL — ABNORMAL HIGH (ref 65–99)
Glucose-Capillary: 100 mg/dL — ABNORMAL HIGH (ref 65–99)

## 2017-08-28 LAB — COMPREHENSIVE METABOLIC PANEL
ALK PHOS: 109 U/L (ref 38–126)
ALT: 59 U/L (ref 17–63)
AST: 40 U/L (ref 15–41)
Albumin: 3 g/dL — ABNORMAL LOW (ref 3.5–5.0)
Anion gap: 9 (ref 5–15)
BILIRUBIN TOTAL: 0.7 mg/dL (ref 0.3–1.2)
BUN: 15 mg/dL (ref 6–20)
CO2: 29 mmol/L (ref 22–32)
CREATININE: 1.21 mg/dL (ref 0.61–1.24)
Calcium: 8.4 mg/dL — ABNORMAL LOW (ref 8.9–10.3)
Chloride: 100 mmol/L — ABNORMAL LOW (ref 101–111)
GFR calc Af Amer: >60 mL/min (ref >60)
GFR, EST NON AFRICAN AMERICAN: 58 mL/min — AB (ref >60)
Glucose, Bld: 190 mg/dL — ABNORMAL HIGH (ref 65–99)
Potassium: 4 mmol/L (ref 3.5–5.1)
Sodium: 138 mmol/L (ref 135–145)
TOTAL PROTEIN: 6.1 g/dL — AB (ref 6.5–8.1)

## 2017-08-28 LAB — CBC
HCT: 35.4 % — ABNORMAL LOW (ref 39.0–52.0)
Hemoglobin: 10.2 g/dL — ABNORMAL LOW (ref 13.0–17.0)
MCH: 26.7 pg (ref 26.0–34.0)
MCHC: 28.8 g/dL — ABNORMAL LOW (ref 30.0–36.0)
MCV: 92.7 fL (ref 78.0–100.0)
PLATELETS: 272 10*3/uL (ref 150–400)
RBC: 3.82 MIL/uL — ABNORMAL LOW (ref 4.22–5.81)
RDW: 15.4 % (ref 11.5–15.5)
WBC: 12 10*3/uL — AB (ref 4.0–10.5)

## 2017-08-28 LAB — OCCULT BLOOD X 1 CARD TO LAB, STOOL: Fecal Occult Bld: NEGATIVE

## 2017-08-28 LAB — MAGNESIUM: MAGNESIUM: 2 mg/dL (ref 1.7–2.4)

## 2017-08-28 LAB — C DIFFICILE QUICK SCREEN W PCR REFLEX
C DIFFICILE (CDIFF) TOXIN: NEGATIVE
C Diff antigen: POSITIVE — AB

## 2017-08-28 LAB — CLOSTRIDIUM DIFFICILE BY PCR, REFLEXED: Toxigenic C. Difficile by PCR: POSITIVE — AB

## 2017-08-28 LAB — TROPONIN I: Troponin I: 0.38 ng/mL (ref <0.03)

## 2017-08-28 LAB — HEPARIN LEVEL (UNFRACTIONATED): HEPARIN UNFRACTIONATED: 0.64 [IU]/mL (ref 0.30–0.70)

## 2017-08-28 NOTE — Progress Notes (Signed)
PROGRESS NOTE    [name]  HYW:[id number] DOB: 1945-09-22 DOA: 08/26/2017 PCP: Bernell List, MD  Outpatient Specialists:    Brief Narrative: Patient is a 72 y.o. male with hx of depression, past hx etoh abuse, DM, CAD sp CABG, HL, HTN, seizure d/o, OSA and CVA, memory impairment. Patient was admitted with SOB, diarrhea and distended abdomen.  In ED CXR showed +early CHF.  CT was done for distended abdomen and plain film showing distended SB, but on CT abd the SB was normal.  Trop was high at 0.61.  Cardiology was called by ED MD and they suggested IV heparin for poss nstemi and admit patient to [facility] for further assessment of elevated troponin.   Main chronic issue is diarrhea following near total colectomy, also hx Crohn's. Per the wife they are getting ready to do a colostomy because "nothing else is working".  He was admitted recently in November for 2 days for rectal bleeding. Hb was stable, was not actively bleeding.  Seen by GI, they rec'd do continue asa and f/u in GI clinic with their GI doctor Dr [name].  Stool cdif was neg and CT abd/ pelv was negative.   08/28/17 -  Patient seen. Cardiology follow up is noted. No plans for cardiac cath as per Cardiology. Elevated troponin is felt to be related to medical condition. Repeat Troponin is down to 0.38. Patient will be discharged back home once patient is optimized. No chest pain. No SOB.  Assessment & Plan:   Principal Problem:   NSTEMI (non-ST elevated myocardial infarction) ([place]) Active Problems:   History of seizure disorder   Depression   Diabetes mellitus ([place])   Seizure disorder (HCC)   Dyspnea   Chronic respiratory failure, unspecified whether with hypoxia or hypercapnia (HCC)   HLD (hyperlipidemia)   Essential hypertension   Coronary artery disease involving coronary bypass graft of native heart with angina pectoris ([place])   History of CVA with residual deficit   General weakness   Acute CHF (congestive heart failure)  (HCC)   Pressure injury of skin   Assessment and Plan:  1. Dyspnea - Possibly due to acute diastolic CHF, new onset for this patient.  Hx CABG 2005. Continue diuretics  2. Elevated Troponin - Cardiology input is appreciated. Downward trend is noted. 3. Chronic diarrhea - hx Crohn's and then had almost total colectomy in 2004 for recurrent and severe diverticulitis/ diverticulosis +/- IBD.  They are considering having colostomy done for this.   4. DM 2 - on po meds x 1 only  5. Dementia - subsequent to CVA 6. Depression - cont meds 7. Seizure d/o - cont lamictal   DVT prophylaxis: Heparin drip Code Status: Full Family Communication:  Disposition Plan: will depend on hospital course   Consultants:   Cardiology  Procedures:    ECHO  Antimicrobials:   None   Subjective: No new complaints  Objective: Vitals:   08/27/17 2334 08/28/17 0402 08/28/17 0622 08/28/17 1000  BP:  105/62    Pulse: (!) 51 68  64  Resp: 12 13  15   Temp:  97.8 F (36.6 C)    TempSrc:  Axillary    SpO2: 94% 96%  96%  Weight:   91.2 kg (201 lb 1.6 oz)   Height:        Intake/Output Summary (Last 24 hours) at 08/28/2017 1316 Last data filed at 08/28/2017 1253 Gross per 24 hour  Intake 1186.63 ml  Output 1350 ml  Net -  163.37 ml   Filed Weights   08/26/17 2030 08/26/17 2104 08/28/17 0622  Weight: 90.7 kg (200 lb) 95.3 kg (210 lb) 91.2 kg (201 lb 1.6 oz)    Examination:  General exam: Appears calm and comfortable  Respiratory system: Clear to auscultation. Respiratory effort normal. Cardiovascular system: S1 & S2 heard, RRR. No JVD, murmurs, rubs, gallops or clicks. No pedal edema. Gastrointestinal system: Abdomen is obese, soft and nontender. No organomegaly or masses felt. Normal bowel sounds heard. Central nervous system: Alert and oriented. No focal neurological deficits. Extremities: Symmetric 5 x 5 power.  Data Reviewed: I have personally reviewed following labs and imaging  studies  CBC: Recent Labs  Lab 08/26/17 2051 08/27/17 0503 08/28/17 0346  WBC 13.7* 12.4* 12.0*  HGB 10.9* 10.7* 10.2*  HCT 34.5* 32.6* 35.4*  MCV 92.0 92.1 92.7  PLT 321 279 381   Basic Metabolic Panel: Recent Labs  Lab 08/26/17 2210 08/27/17 0503 08/28/17 0929  NA 136 139 138  K 4.5 3.6 4.0  CL 103 102 100*  CO2 25 27 29   GLUCOSE 104* 105* 190*  BUN 13 12 15   CREATININE 0.92 0.91 1.21  CALCIUM 8.5* 8.5* 8.4*  MG  --   --  2.0   GFR: Estimated Creatinine Clearance: 57.5 mL/min (by C-G formula based on SCr of 1.21 mg/dL). Liver Function Tests: Recent Labs  Lab 08/26/17 2210 08/28/17 0929  AST 59* 40  ALT 84* 59  ALKPHOS 138* 109  BILITOT 0.7 0.7  PROT 6.9 6.1*  ALBUMIN 3.2* 3.0*   Recent Labs  Lab 08/26/17 2210  LIPASE 77*   No results for input(s): AMMONIA in the last 168 hours. Coagulation Profile: Recent Labs  Lab 08/27/17 0130  INR 1.14   Cardiac Enzymes: Recent Labs  Lab 08/27/17 0502 08/28/17 0929  TROPONINI 0.61* 0.38*   BNP (last 3 results) No results for input(s): PROBNP in the last 8760 hours. HbA1C: No results for input(s): HGBA1C in the last 72 hours. CBG: Recent Labs  Lab 08/27/17 2047 08/28/17 0022 08/28/17 0401 08/28/17 0811 08/28/17 1151  GLUCAP 113* 101* 97 124* 139*   Lipid Profile: No results for input(s): CHOL, HDL, LDLCALC, TRIG, CHOLHDL, LDLDIRECT in the last 72 hours. Thyroid Function Tests: Recent Labs    08/27/17 0502  TSH 2.717   Anemia Panel: No results for input(s): VITAMINB12, FOLATE, FERRITIN, TIBC, IRON, RETICCTPCT in the last 72 hours. Urine analysis:    Component Value Date/Time   COLORURINE YELLOW 08/26/2017 2218   APPEARANCEUR CLEAR 08/26/2017 2218   LABSPEC 1.009 08/26/2017 2218   PHURINE 5.0 08/26/2017 2218   GLUCOSEU NEGATIVE 08/26/2017 2218   HGBUR SMALL (A) 08/26/2017 2218   BILIRUBINUR NEGATIVE 08/26/2017 2218   BILIRUBINUR neg 04/27/2014 [place] 08/26/2017 2218    PROTEINUR NEGATIVE 08/26/2017 2218   UROBILINOGEN 0.2 03/07/2015 1005   NITRITE NEGATIVE 08/26/2017 2218   LEUKOCYTESUR NEGATIVE 08/26/2017 2218   Sepsis Labs: @LABRCNTIP (procalcitonin:4,lacticidven:4)  ) Recent Results (from the past 240 hour(s))  Culture, blood (routine x 2)     Status: None (Preliminary result)   Collection Time: 08/26/17 10:20 PM  Result Value Ref Range Status   Specimen Description BLOOD LEFT WRIST  Final   Special Requests   Final    BOTTLES DRAWN AEROBIC AND ANAEROBIC Blood Culture adequate volume   Culture   Final    NO GROWTH 1 DAY Performed at [facility], [place] [address] [place], [place] [address]  Report Status PENDING  Incomplete  Culture, blood (routine x 2)     Status: None (Preliminary result)   Collection Time: 08/27/17  1:30 AM  Result Value Ref Range Status   Specimen Description BLOOD LEFT ARM  Final   Special Requests   Final    BOTTLES DRAWN AEROBIC AND ANAEROBIC Blood Culture adequate volume   Culture   Final    NO GROWTH 1 DAY Performed at [facility], [address] [place],  [address]    Report Status PENDING  Incomplete         Radiology Studies: Ct Abdomen Pelvis Wo Contrast  Result Date: 08/27/2017 CLINICAL DATA:  Acute onset of generalized abdominal distention and diarrhea. Leukocytosis. Elevated lipase. Microhematuria. EXAM: CT ABDOMEN AND PELVIS WITHOUT CONTRAST TECHNIQUE: Multidetector CT imaging of the abdomen and pelvis was performed following the standard protocol without IV contrast. COMPARISON:  CT of the abdomen and pelvis from 07/06/2017 FINDINGS: Lower chest: Trace bilateral pleural fluid is noted, with interstitial prominence, likely reflecting mild interstitial edema. Scattered coronary artery calcifications are seen. The patient is status post median sternotomy. Hepatobiliary: The liver is unremarkable in appearance. The gallbladder is unremarkable in appearance. The common bile duct  remains normal in caliber. Pancreas: The pancreas is within normal limits. Spleen: The spleen is unremarkable in appearance. Adrenals/Urinary Tract: The adrenal glands are unremarkable in appearance. The kidneys are within normal limits. There is no evidence of hydronephrosis. No renal or ureteral stones are identified. Nonspecific perinephric stranding and fluid is noted bilaterally. Stomach/Bowel: The patient is status post resection of much of the colon. The ileocolic anastomosis is unremarkable. Remaining small bowel is unremarkable in appearance. The stomach is within normal limits. Vascular/Lymphatic: Diffuse calcification is seen along the abdominal aorta and its branches. The abdominal aorta is otherwise grossly unremarkable. The inferior vena cava is grossly unremarkable. No retroperitoneal lymphadenopathy is seen. No pelvic sidewall lymphadenopathy is identified. Reproductive: The bladder is mildly distended and grossly unremarkable. The prostate remains normal in size, with minimal calcification. A penile implant is noted, with a right-sided pelvic reservoir. Other: No additional soft tissue abnormalities are seen. Musculoskeletal: No acute osseous abnormalities are identified. The patient is status post lumbosacral spinal fusion at L5-S1. The visualized musculature is unremarkable in appearance. IMPRESSION: 1. Trace bilateral pleural fluid, with interstitial prominence, likely reflecting mild interstitial edema. 2. Scattered coronary artery calcifications. 3. Ileocolic anastomosis is unremarkable in appearance. Aortic Atherosclerosis (ICD10-I70.0). Electronically Signed   By: Garald Balding M.D.   On: 08/27/2017 00:25   Dg Chest 2 View  Result Date: 08/26/2017 CLINICAL DATA:  Congestion, cough, swelling, and shortness of breath. History of diabetes. Nonsmoker. EXAM: CHEST  2 VIEW COMPARISON:  04/24/2017 FINDINGS: Postoperative changes in the mediastinum. Cardiac enlargement with pulmonary vascular  congestion. Interstitial opacities in the lung bases likely represent edema. Small bilateral pleural effusions. No focal consolidation. No pneumothorax. Calcification of the aorta. Degenerative changes in the spine. IMPRESSION: Cardiac enlargement with pulmonary vascular congestion and mild interstitial edema. Small bilateral pleural effusions. Electronically Signed   By: Lucienne Capers M.D.   On: 08/26/2017 21:10   Dg Abdomen 1 View  Result Date: 08/26/2017 CLINICAL DATA:  Acute onset of shortness of breath. Diarrhea. Abdominal distention. EXAM: ABDOMEN - 1 VIEW COMPARISON:  CT of the abdomen and pelvis from 07/06/2017 FINDINGS: There is distention of small-bowel loops 4.2 cm in diameter, raising concern for partial obstruction or dysmotility. Residual air is seen along the descending colon.  The stomach is partially distended with air. No free intra-abdominal air is identified, though evaluation for free air is limited on a single supine view. The patient is status post spinal fusion at L5-S1; the sacroiliac joints are unremarkable in appearance. The visualized lung bases are essentially clear. IMPRESSION: Distention of small-bowel loops to 4.2 cm in diameter, raising concern for partial small bowel obstruction or dysmotility. Residual air seen within the descending colon. Electronically Signed   By: Garald Balding M.D.   On: 08/26/2017 22:09        Scheduled Meds: . ARIPiprazole  5 mg Oral Daily  . aspirin  324 mg Oral Once  . aspirin  325 mg Oral Daily  . atorvastatin  80 mg Oral QHS  . diphenoxylate-atropine  2 tablet Oral Q6H  . donepezil  10 mg Oral Daily  . FLUoxetine  40 mg Oral Daily  . furosemide  40 mg Intravenous Q12H  . glipiZIDE  2.5 mg Oral BID AC  . insulin aspart  0-8 Units Subcutaneous Q4H  . lamoTRIgine  100 mg Oral BID  . multivitamin with minerals  1 tablet Oral Daily  . potassium citrate  10 mEq Oral BID  . traZODone  100 mg Oral QHS   Continuous Infusions: .  heparin 1,300 Units/hr (08/28/17 0700)     LOS: 1 day    Time spent: [place]    Dana Allan, MD  [facility] Pager #: [phone] 7PM-7AM contact night coverage as above

## 2017-08-28 NOTE — Progress Notes (Signed)
Patient has not been successful in having a bowel movement so far. Spoke to Dr. [name] who wants a stool sample. Will continue to follow-up with patient's bowel movements.

## 2017-08-28 NOTE — Progress Notes (Signed)
[facility] PROGRESS NOTE Subjective Patient was transferred from [facility] long ER yesterday evening due to N STEMI.  He has had chronic diarrhea and multiple stool studies were ordered but have not yet been done.  Nursing staff notes that in spite of his plaints of diarrhea with a bowel movements every 1-2 hours he has had no bowel movements overnight so they have been unable to collect any stool.  Objective: Vital signs in last 24 hours: Temp:  [97.6 F (36.4 C)-97.8 F (36.6 C)] 97.8 F (36.6 C) (01/03 0402) Pulse Rate:  [51-79] 68 (01/03 0402) Resp:  [12-34] 13 (01/03 0402) BP: (105-118)/(53-75) 105/62 (01/03 0402) SpO2:  [93 %-98 %] 96 % (01/03 0402) Weight:  [91.2 kg (201 lb 1.6 oz)] 91.2 kg (201 lb 1.6 oz) (01/03 0622) Last BM Date: 08/26/17  Intake/Output from previous day: 01/02 0701 - 01/03 0700 In: 686.6 [P.O.:360; I.V.:326.6] Out: 750 [Urine:750] Intake/Output this shift: Total I/O In: 360 [P.O.:360] Out: -   PE: General--patient pleasantly confused trying to crawl out of bed reinforced to him that he needs to remain in bed  Abdomen--soft and nontender  Lab Results: Recent Labs    08/26/17 2051 08/27/17 0503 08/28/17 0346  WBC 13.7* 12.4* 12.0*  HGB 10.9* 10.7* 10.2*  HCT 34.5* 32.6* 35.4*  PLT 321 279 272   BMET Recent Labs    08/26/17 2210 08/27/17 0503  NA 136 139  K 4.5 3.6  CL 103 102  CO2 25 27  CREATININE 0.92 0.91   LFT Recent Labs    08/26/17 2210  PROT 6.9  AST 59*  ALT 84*  ALKPHOS 138*  BILITOT 0.7  BILIDIR 0.2  IBILI 0.5   PT/INR Recent Labs    08/27/17 0130  LABPROT 14.5  INR 1.14   PANCREAS Recent Labs    08/26/17 2210  LIPASE 77*         Studies/Results: Ct Abdomen Pelvis Wo Contrast  Result Date: 08/27/2017 CLINICAL DATA:  Acute onset of generalized abdominal distention and diarrhea. Leukocytosis. Elevated lipase. Microhematuria. EXAM: CT ABDOMEN AND PELVIS WITHOUT CONTRAST TECHNIQUE:  Multidetector CT imaging of the abdomen and pelvis was performed following the standard protocol without IV contrast. COMPARISON:  CT of the abdomen and pelvis from 07/06/2017 FINDINGS: Lower chest: Trace bilateral pleural fluid is noted, with interstitial prominence, likely reflecting mild interstitial edema. Scattered coronary artery calcifications are seen. The patient is status post median sternotomy. Hepatobiliary: The liver is unremarkable in appearance. The gallbladder is unremarkable in appearance. The common bile duct remains normal in caliber. Pancreas: The pancreas is within normal limits. Spleen: The spleen is unremarkable in appearance. Adrenals/Urinary Tract: The adrenal glands are unremarkable in appearance. The kidneys are within normal limits. There is no evidence of hydronephrosis. No renal or ureteral stones are identified. Nonspecific perinephric stranding and fluid is noted bilaterally. Stomach/Bowel: The patient is status post resection of much of the colon. The ileocolic anastomosis is unremarkable. Remaining small bowel is unremarkable in appearance. The stomach is within normal limits. Vascular/Lymphatic: Diffuse calcification is seen along the abdominal aorta and its branches. The abdominal aorta is otherwise grossly unremarkable. The inferior vena cava is grossly unremarkable. No retroperitoneal lymphadenopathy is seen. No pelvic sidewall lymphadenopathy is identified. Reproductive: The bladder is mildly distended and grossly unremarkable. The prostate remains normal in size, with minimal calcification. A penile implant is noted, with a right-sided pelvic reservoir. Other: No additional soft tissue abnormalities are seen. Musculoskeletal: No acute osseous abnormalities are identified.  The patient is status post lumbosacral spinal fusion at L5-S1. The visualized musculature is unremarkable in appearance. IMPRESSION: 1. Trace bilateral pleural fluid, with interstitial prominence, likely  reflecting mild interstitial edema. 2. Scattered coronary artery calcifications. 3. Ileocolic anastomosis is unremarkable in appearance. Aortic Atherosclerosis (ICD10-I70.0). Electronically Signed   By: Garald Balding M.D.   On: 08/27/2017 00:25   Dg Chest 2 View  Result Date: 08/26/2017 CLINICAL DATA:  Congestion, cough, swelling, and shortness of breath. History of diabetes. Nonsmoker. EXAM: CHEST  2 VIEW COMPARISON:  04/24/2017 FINDINGS: Postoperative changes in the mediastinum. Cardiac enlargement with pulmonary vascular congestion. Interstitial opacities in the lung bases likely represent edema. Small bilateral pleural effusions. No focal consolidation. No pneumothorax. Calcification of the aorta. Degenerative changes in the spine. IMPRESSION: Cardiac enlargement with pulmonary vascular congestion and mild interstitial edema. Small bilateral pleural effusions. Electronically Signed   By: Lucienne Capers M.D.   On: 08/26/2017 21:10   Dg Abdomen 1 View  Result Date: 08/26/2017 CLINICAL DATA:  Acute onset of shortness of breath. Diarrhea. Abdominal distention. EXAM: ABDOMEN - 1 VIEW COMPARISON:  CT of the abdomen and pelvis from 07/06/2017 FINDINGS: There is distention of small-bowel loops 4.2 cm in diameter, raising concern for partial obstruction or dysmotility. Residual air is seen along the descending colon. The stomach is partially distended with air. No free intra-abdominal air is identified, though evaluation for free air is limited on a single supine view. The patient is status post spinal fusion at L5-S1; the sacroiliac joints are unremarkable in appearance. The visualized lung bases are essentially clear. IMPRESSION: Distention of small-bowel loops to 4.2 cm in diameter, raising concern for partial small bowel obstruction or dysmotility. Residual air seen within the descending colon. Electronically Signed   By: Garald Balding M.D.   On: 08/26/2017 22:09    Medications: I have reviewed the  patient's current medications.  Assessment:   1.  Chronic diarrhea.  Due likely to short bowel syndrome.  He has had a subtotal colectomy due to diverticulitis many years ago.  There is always a possibility he could have infection, inflammation, C. difficile etc. the studies have been ordered but are still pending.  Despite his complaints of stools every hour apparently had no bowel movements overnight.   Plan: We will reinforced to the staff need to record the number of stools to see how much diarrhea he is truly having.  Also reinforced the need to collect the studies that have previously been ordered.   Nancy Fetter 08/28/2017, 8:59 AM  This note was created using voice recognition software. Minor errors may Have occurred unintentionally.  Pager: [phone] If no answer or after hours call [phone]

## 2017-08-28 NOTE — Progress Notes (Signed)
ANTICOAGULATION CONSULT NOTE - Follow Up Consult  Pharmacy Consult for Heparin Indication: chest pain/ACS  Allergies  Allergen Reactions  . Cephalexin     Unknown  . Doxycycline     Unknown  . Keflex [Cephalexin]   . Lac Bovis Other (See Comments)    lactose intolerant  . Pentazocine Lactate     He passed out- he had a seizure.  This occurred around 2000  . Talwin [Pentazocine] Other (See Comments)    hallucinations  . Tape Other (See Comments)    Paper tape only please.  Freddi Starr [Tizanidine Hcl] Other (See Comments)    Lightheaded and dizzy    Patient Measurements: Height: 5' 6"  (167.6 cm) Weight: 201 lb 1.6 oz (91.2 kg) IBW/kg (Calculated) : 63.8 Heparin Dosing Weight: 84.4 kg  Vital Signs: Temp: 97.8 F (36.6 C) (01/03 0402) Temp Source: Axillary (01/03 0402) BP: 105/62 (01/03 0402) Pulse Rate: 68 (01/03 0402)  Labs: Recent Labs    08/26/17 2051 08/26/17 2210 08/27/17 0130 08/27/17 0502 08/27/17 0503 08/27/17 1150 08/27/17 2208 08/28/17 0346  HGB 10.9*  --   --   --  10.7*  --   --  10.2*  HCT 34.5*  --   --   --  32.6*  --   --  35.4*  PLT 321  --   --   --  279  --   --  272  APTT  --   --  33  --   --   --   --   --   LABPROT  --   --  14.5  --   --   --   --   --   INR  --   --  1.14  --   --   --   --   --   HEPARINUNFRC  --   --   --   --   --  0.17* 0.59 0.64  CREATININE  --  0.92  --   --  0.91  --   --   --   TROPONINI  --   --   --  0.61*  --   --   --   --     Estimated Creatinine Clearance: 76.5 mL/min (by C-G formula based on SCr of 0.91 mg/dL).   Medications:  Infusions:  . heparin 1,300 Units/hr (08/28/17 8466)    Assessment: 57 yoM presented to ED on the evening of 08/26/17 with SOB, diarrhea, distended abdomen.  PMH significant for Afib, CVA, DM, HTN, CAD, diverticulitis, and PE (2015).  Patient noted to have bloody stool, which has been determined to be resulting from hemorrhoids.  No oral anticoagulation PTA - he was  previously on Eliquis, but this was stopped (sometime between 03/2017 admission and 06/2017 admission) due to GI bleeding and falls; he remains on aspirin 313m.  Pharmacy consulted to dose IV heparin for NSTEMI.  Heparin level remains therapeutic this morning at 0.64.  Goal of Therapy:  Heparin level 0.3-0.7 units/ml Monitor platelets by anticoagulation protocol: Yes   Plan:  Continue heparin at 1300 units/hr Monitor heparin level, CBC, signs and symptoms of bleeding.   Thank you for allowing [facility] participate in this patients care.  AMariella Saa PharmD 08/28/2017 11:03 AM

## 2017-08-28 NOTE — Progress Notes (Signed)
Stool sample acquired- taken to lab.

## 2017-08-28 NOTE — Progress Notes (Signed)
Progress Note  Patient Name: [name] Date of Encounter: 08/28/2017  Primary Cardiologist: Dr. Hewitt Shorts (has not seen cardiology since 10/2015)  Subjective   Feeling "Fine" this morning. No CP or SOB. Belching and edema is better. No flatus this AM. No diarrhea overnight. Remains very stoic.  Inpatient Medications    Scheduled Meds: . ARIPiprazole  5 mg Oral Daily  . aspirin  324 mg Oral Once  . aspirin  325 mg Oral Daily  . atorvastatin  80 mg Oral QHS  . diphenoxylate-atropine  2 tablet Oral Q6H  . donepezil  10 mg Oral Daily  . FLUoxetine  40 mg Oral Daily  . furosemide  40 mg Intravenous Q12H  . glipiZIDE  2.5 mg Oral BID AC  . insulin aspart  0-8 Units Subcutaneous Q4H  . lamoTRIgine  100 mg Oral BID  . multivitamin with minerals  1 tablet Oral Daily  . potassium citrate  10 mEq Oral BID  . traZODone  100 mg Oral QHS   Continuous Infusions: . heparin 1,300 Units/hr (08/28/17 0639)   PRN Meds: acetaminophen, acetaminophen, HYDROcodone-acetaminophen, nitroGLYCERIN, ondansetron (ZOFRAN) IV   Vital Signs    Vitals:   08/27/17 1921 08/27/17 2334 08/28/17 0402 08/28/17 0622  BP: 116/60  105/62   Pulse: 73 (!) 51 68   Resp: 20 12 13    Temp: 97.6 F (36.4 C)  97.8 F (36.6 C)   TempSrc: Oral  Axillary   SpO2:  94% 96%   Weight:    201 lb 1.6 oz (91.2 kg)  Height:        Intake/Output Summary (Last 24 hours) at 08/28/2017 0907 Last data filed at 08/28/2017 0844 Gross per 24 hour  Intake 1046.63 ml  Output 750 ml  Net 296.63 ml   Filed Weights   08/26/17 2030 08/26/17 2104 08/28/17 0622  Weight: 200 lb (90.7 kg) 210 lb (95.3 kg) 201 lb 1.6 oz (91.2 kg)    Telemetry    NSR - Personally Reviewed  ECG    NSR with continued diffuse ST-T changes although appears slightly improved from yesterday - Personally Reviewed  Physical Exam   GEN: No acute distress. Chronically ill, very weak when trying to sit up HEENT: Normocephalic, atraumatic, sclera  non-icteric. Neck: No JVD or bruits. Cardiac: RRR no murmurs, rubs, or gallops.  Radials/DP/PT 1+ and equal bilaterally.  Respiratory: Clear to auscultation bilaterally. Breathing is unlabored. GI: Soft, nontender, non-distended, BS +x 4. MS: no deformity. Extremities: No clubbing or cyanosis. Trace BLE edema, no longer pitting. Distal pedal pulses are 2+ and equal bilaterally. Neuro:  AAOx3. Follows commands. Psych: Flat affect, short answers only, not very participatory in conversation  Labs    Chemistry Recent Labs  Lab 08/26/17 2210 08/27/17 0503  NA 136 139  K 4.5 3.6  CL 103 102  CO2 25 27  GLUCOSE 104* 105*  BUN 13 12  CREATININE 0.92 0.91  CALCIUM 8.5* 8.5*  PROT 6.9  --   ALBUMIN 3.2*  --   AST 59*  --   ALT 84*  --   ALKPHOS 138*  --   BILITOT 0.7  --   GFRNONAA >60 >60  GFRAA >60 >60  ANIONGAP 8 10     Hematology Recent Labs  Lab 08/26/17 2051 08/27/17 0503 08/28/17 0346  WBC 13.7* 12.4* 12.0*  RBC 3.75* 3.54* 3.82*  HGB 10.9* 10.7* 10.2*  HCT 34.5* 32.6* 35.4*  MCV 92.0 92.1 92.7  MCH 29.1 30.2  26.7  MCHC 31.6 32.8 28.8*  RDW 15.2 15.2 15.4  PLT 321 279 272    Cardiac Enzymes Recent Labs  Lab 08/27/17 0502  TROPONINI 0.61*    Recent Labs  Lab 08/26/17 2101  TROPIPOC 0.34*     BNP Recent Labs  Lab 08/26/17 2051  BNP 533.3*     DDimer  Recent Labs  Lab 08/27/17 0502  DDIMER 0.99*     Radiology    Ct Abdomen Pelvis Wo Contrast  Result Date: 08/27/2017 CLINICAL DATA:  Acute onset of generalized abdominal distention and diarrhea. Leukocytosis. Elevated lipase. Microhematuria. EXAM: CT ABDOMEN AND PELVIS WITHOUT CONTRAST TECHNIQUE: Multidetector CT imaging of the abdomen and pelvis was performed following the standard protocol without IV contrast. COMPARISON:  CT of the abdomen and pelvis from 07/06/2017 FINDINGS: Lower chest: Trace bilateral pleural fluid is noted, with interstitial prominence, likely reflecting mild  interstitial edema. Scattered coronary artery calcifications are seen. The patient is status post median sternotomy. Hepatobiliary: The liver is unremarkable in appearance. The gallbladder is unremarkable in appearance. The common bile duct remains normal in caliber. Pancreas: The pancreas is within normal limits. Spleen: The spleen is unremarkable in appearance. Adrenals/Urinary Tract: The adrenal glands are unremarkable in appearance. The kidneys are within normal limits. There is no evidence of hydronephrosis. No renal or ureteral stones are identified. Nonspecific perinephric stranding and fluid is noted bilaterally. Stomach/Bowel: The patient is status post resection of much of the colon. The ileocolic anastomosis is unremarkable. Remaining small bowel is unremarkable in appearance. The stomach is within normal limits. Vascular/Lymphatic: Diffuse calcification is seen along the abdominal aorta and its branches. The abdominal aorta is otherwise grossly unremarkable. The inferior vena cava is grossly unremarkable. No retroperitoneal lymphadenopathy is seen. No pelvic sidewall lymphadenopathy is identified. Reproductive: The bladder is mildly distended and grossly unremarkable. The prostate remains normal in size, with minimal calcification. A penile implant is noted, with a right-sided pelvic reservoir. Other: No additional soft tissue abnormalities are seen. Musculoskeletal: No acute osseous abnormalities are identified. The patient is status post lumbosacral spinal fusion at L5-S1. The visualized musculature is unremarkable in appearance. IMPRESSION: 1. Trace bilateral pleural fluid, with interstitial prominence, likely reflecting mild interstitial edema. 2. Scattered coronary artery calcifications. 3. Ileocolic anastomosis is unremarkable in appearance. Aortic Atherosclerosis (ICD10-I70.0). Electronically Signed   By: Garald Balding M.D.   On: 08/27/2017 00:25   Dg Chest 2 View  Result Date:  08/26/2017 CLINICAL DATA:  Congestion, cough, swelling, and shortness of breath. History of diabetes. Nonsmoker. EXAM: CHEST  2 VIEW COMPARISON:  04/24/2017 FINDINGS: Postoperative changes in the mediastinum. Cardiac enlargement with pulmonary vascular congestion. Interstitial opacities in the lung bases likely represent edema. Small bilateral pleural effusions. No focal consolidation. No pneumothorax. Calcification of the aorta. Degenerative changes in the spine. IMPRESSION: Cardiac enlargement with pulmonary vascular congestion and mild interstitial edema. Small bilateral pleural effusions. Electronically Signed   By: Lucienne Capers M.D.   On: 08/26/2017 21:10   Dg Abdomen 1 View  Result Date: 08/26/2017 CLINICAL DATA:  Acute onset of shortness of breath. Diarrhea. Abdominal distention. EXAM: ABDOMEN - 1 VIEW COMPARISON:  CT of the abdomen and pelvis from 07/06/2017 FINDINGS: There is distention of small-bowel loops 4.2 cm in diameter, raising concern for partial obstruction or dysmotility. Residual air is seen along the descending colon. The stomach is partially distended with air. No free intra-abdominal air is identified, though evaluation for free air is limited on a single supine view. The  patient is status post spinal fusion at L5-S1; the sacroiliac joints are unremarkable in appearance. The visualized lung bases are essentially clear. IMPRESSION: Distention of small-bowel loops to 4.2 cm in diameter, raising concern for partial small bowel obstruction or dysmotility. Residual air seen within the descending colon. Electronically Signed   By: Garald Balding M.D.   On: 08/26/2017 22:09    Patient Profile     72 y.o. male with CAD s/p CABGx4 12/2003, pulmonary embolism 04/2014, stroke, HTN, HLD, seizure, depression, prior alcohol abuse, DM, GERD, sleep apnea (does not use CPAP), mild carotid artery disease, chronic respiratory failure on home O2 with prior possible asbestos exposure, diverticulosis s/p  partial colectomy, h/o AKI (With ARB -> normal renal dopplers 2014), recent GI issues/GI bleeding and frequent falling requiring discontinuation of Eliquis. Admitted for abd distention, worsening dyspnea, lower extremity edema, persistent diarrhea, + FOBT, elevated troponin and possible CHF.  Assessment & Plan    1. Continued diarrhea, abdominal distention, frequent belching and heme-positive stools with recent evidence of GI bleeding - Dr. [name] following. Initial plain films suggested SBO but CT unrevealing. Prior C Diff studies have been negative. More comprehensive stool studies are pending this AM. Patient has not had any recurrent diarrhea overnight, actually. Not clear what plan will be procedure-wise given his + hemoccult.  2. CAD s/p CABG 2005 with possible NSTEMI this admission - no cath since his bypass. Stress test 2016 normal. EKG is concerning for global ischemia. Clinical picture is muddled by recent plethora of GI issues. ED spoke with cardiology fellow who recommended to heparinize. He remains on full dose ASA for now (home dose). Note sometime this fall he was actually taken off Eliquis due to GI bleeding and falls, therefore he is not ideal candidate for dual-antiplatelet therapy either given GI issues. He's not had any chest pain. He's not on BB due to h/o low-normal blood pressure. Continue statin. Per d/w Dr. Acie Fredrickson, not a good candidate for cath given the above plethora of issues. He appears quite debilitated even just trying to sit up in bed. He is asymptomatic today. His hemoglobin has downtrended slightly. I will continue to review plan with MD including duration of heparin therapy. 2D echo pending. Spoke with nurse about touching base with labs - they did not draw the CMET, Mg, or cycled troponins that were ordered for this AM but did draw CBC.  3. Volume overload compatible with acute (suspected diastolic) CHF - await echocardiogram. His edema also could be due in part to  hypoalbuminemia. LE duplex neg for DVT. Weights inconsistent, not much UOP recorded but clinically patient appears to be improving. Continue IV Lasix this AM and await labs; will review with MD regarding further management. He is actually 13lb below weight in 11/2016.  4. H/o stroke - he is maintained on ASA instead of Eliquis for now. Loop recorder surveillance was previously de-activated in 2017 because he was already anticoagulated at that time and findings of AF would not have changed management.  5. HTN - blood pressure low normal. Will need to follow with diuresis.  6. H/o EtOH abuse per chart - pt denies recent use.  For questions or updates, please contact [place] Please consult [email or web address] for contact info under Cardiology/STEMI.  Signed, Charlie Pitter, PA-C 08/28/2017, 9:07 AM    Attending Note:   The patient was seen and examined.  Agree with assessment and plan as noted above.  Changes made to the above note as needed.  Patient seen and independently examined with Melina Copa  PA .   We discussed all aspects of the encounter. I agree with the assessment and plan as stated above.  1.  1.  Coronary artery disease: The patient is not having any episodes of chest pain.  He does have a minimally elevated troponin level which is probably a reflection of his other underlying medical issues in the setting of baseline coronary artery disease.  We have no plans for cardiac catheterization or intervention at this time.  I think that his main issues are GI related.  2.  History of stroke: He is been maintained on full strength aspirin.  Further discussion per neurology.   I have spent a total of 40 minutes with patient reviewing hospital  notes , telemetry, EKGs, labs and examining patient as well as establishing an assessment and plan that was discussed with the patient. > 50% of time was spent in direct patient care.  3.  Essential hypertension: Blood pressure is well controlled  at this point.  Thayer Headings, Brooke Bonito., MD, [facility] 08/28/2017, 1:18 PM [address]  [place] Pager [phone]

## 2017-08-29 ENCOUNTER — Inpatient Hospital Stay (HOSPITAL_COMMUNITY): Payer: Medicare Other

## 2017-08-29 DIAGNOSIS — I340 Nonrheumatic mitral (valve) insufficiency: Secondary | ICD-10-CM

## 2017-08-29 DIAGNOSIS — I5033 Acute on chronic diastolic (congestive) heart failure: Secondary | ICD-10-CM

## 2017-08-29 LAB — HEPARIN LEVEL (UNFRACTIONATED): Heparin Unfractionated: 0.58 IU/mL (ref 0.30–0.70)

## 2017-08-29 LAB — GLUCOSE, CAPILLARY
GLUCOSE-CAPILLARY: 108 mg/dL — AB (ref 65–99)
GLUCOSE-CAPILLARY: 95 mg/dL (ref 65–99)
Glucose-Capillary: 121 mg/dL — ABNORMAL HIGH (ref 65–99)
Glucose-Capillary: 192 mg/dL — ABNORMAL HIGH (ref 65–99)
Glucose-Capillary: 101 mg/dL — ABNORMAL HIGH (ref 65–99)
Glucose-Capillary: 182 mg/dL — ABNORMAL HIGH (ref 65–99)

## 2017-08-29 LAB — GASTROINTESTINAL PANEL BY PCR, STOOL (REPLACES STOOL CULTURE)

## 2017-08-29 LAB — CBC
HCT: 33.5 % — ABNORMAL LOW (ref 39.0–52.0)
Hemoglobin: 10.3 g/dL — ABNORMAL LOW (ref 13.0–17.0)
MCH: 28.5 pg (ref 26.0–34.0)
MCHC: 30.7 g/dL (ref 30.0–36.0)
MCV: 92.8 fL (ref 78.0–100.0)
Platelets: 287 10*3/uL (ref 150–400)
RBC: 3.61 MIL/uL — ABNORMAL LOW (ref 4.22–5.81)
RDW: 15.3 % (ref 11.5–15.5)
WBC: 10 10*3/uL (ref 4.0–10.5)

## 2017-08-29 LAB — ECHOCARDIOGRAM COMPLETE
HEIGHTINCHES: 66 in
WEIGHTICAEL: 3198.4 [oz_av]

## 2017-08-29 LAB — LACTOFERRIN, FECAL, QUALITATIVE: LACTOFERRIN, FECAL, QUAL: NEGATIVE

## 2017-08-29 MED ORDER — ENOXAPARIN SODIUM 40 MG/0.4ML ~~LOC~~ SOLN
40.0000 mg | SUBCUTANEOUS | Status: DC
  Administered 2017-08-29 – 2017-08-31 (×3): 40 mg via SUBCUTANEOUS
  Filled 2017-08-29 (×3): qty 0.4

## 2017-08-29 MED ORDER — VANCOMYCIN 50 MG/ML ORAL SOLUTION
125.0000 mg | Freq: Four times a day (QID) | ORAL | Status: DC
  Administered 2017-08-29 – 2017-09-01 (×13): 125 mg via ORAL
  Filled 2017-08-29 (×15): qty 2.5

## 2017-08-29 MED ORDER — PERFLUTREN LIPID MICROSPHERE
1.0000 mL | INTRAVENOUS | Status: AC | PRN
  Administered 2017-08-29: 2 mL via INTRAVENOUS

## 2017-08-29 NOTE — Plan of Care (Signed)
VSS and WNL. Patient maintains oxygen saturation levels above 95% with 3L supplemental oxygen via nasal cannula

## 2017-08-29 NOTE — Progress Notes (Signed)
Progress Note  Patient Name: [name] Date of Encounter: 08/29/2017  Primary Cardiologist: [place] / Caryl Comes   Subjective   72 year old gentleman with history of chronic GI issues.  He presented with notable GI issues and was found incidentally to have elevated troponin levels.  He denies any chest pain.  Poor candidate for invasive or interventional cardiology procedures.  Troponin level is trending down.  It is thought to be due to  demand ischemia. Echocardiogram has just been completed.   Inpatient Medications    Scheduled Meds: . ARIPiprazole  5 mg Oral Daily  . aspirin  324 mg Oral Once  . aspirin  325 mg Oral Daily  . atorvastatin  80 mg Oral QHS  . diphenoxylate-atropine  2 tablet Oral Q6H  . donepezil  10 mg Oral Daily  . FLUoxetine  40 mg Oral Daily  . furosemide  40 mg Intravenous Q12H  . glipiZIDE  2.5 mg Oral BID AC  . insulin aspart  0-8 Units Subcutaneous Q4H  . lamoTRIgine  100 mg Oral BID  . multivitamin with minerals  1 tablet Oral Daily  . potassium citrate  10 mEq Oral BID  . traZODone  100 mg Oral QHS   Continuous Infusions: . heparin 1,300 Units/hr (08/29/17 0855)   PRN Meds: acetaminophen, HYDROcodone-acetaminophen, nitroGLYCERIN, ondansetron (ZOFRAN) IV, perflutren lipid microspheres (DEFINITY) IV suspension   Vital Signs    Vitals:   08/28/17 1442 08/28/17 1939 08/29/17 0417 08/29/17 0859  BP: (!) 99/54 (!) 94/53 (!) 101/51   Pulse: 80 72 64 85  Resp: 19 (!) 23  20  Temp: 98.7 F (37.1 C) 98.4 F (36.9 C) 98.2 F (36.8 C)   TempSrc: Oral Axillary Oral   SpO2: 96% 97% 97% 97%  Weight:   199 lb 14.4 oz (90.7 kg)   Height:        Intake/Output Summary (Last 24 hours) at 08/29/2017 1201 Last data filed at 08/29/2017 8341 Gross per 24 hour  Intake 1447.21 ml  Output 2050 ml  Net -602.79 ml   Filed Weights   08/26/17 2104 08/28/17 0622 08/29/17 0417  Weight: 210 lb (95.3 kg) 201 lb 1.6 oz (91.2 kg) 199 lb 14.4 oz (90.7 kg)     Telemetry     NSR  - Personally Reviewed  ECG     NSR  - Personally Reviewed  Physical Exam   GEN:  Elderly gentleman in no acute distress. Neck: No JVD Cardiac: RRR, no murmurs, rubs, or gallops.  Respiratory: Clear to auscultation bilaterally. GI: Soft, nontender, non-distended  MS: No edema; No deformity. Neuro:  Generally weak Psych: There seems to be a fair amount of dementia  Labs    Chemistry Recent Labs  Lab 08/26/17 2210 08/27/17 0503 08/28/17 0929  NA 136 139 138  K 4.5 3.6 4.0  CL 103 102 100*  CO2 25 27 29   GLUCOSE 104* 105* 190*  BUN 13 12 15   CREATININE 0.92 0.91 1.21  CALCIUM 8.5* 8.5* 8.4*  PROT 6.9  --  6.1*  ALBUMIN 3.2*  --  3.0*  AST 59*  --  40  ALT 84*  --  59  ALKPHOS 138*  --  109  BILITOT 0.7  --  0.7  GFRNONAA >60 >60 58*  GFRAA >60 >60 >60  ANIONGAP 8 10 9      Hematology Recent Labs  Lab 08/27/17 0503 08/28/17 0346 08/29/17 0407  WBC 12.4* 12.0* 10.0  RBC 3.54* 3.82* 3.61*  HGB 10.7* 10.2* 10.3*  HCT 32.6* 35.4* 33.5*  MCV 92.1 92.7 92.8  MCH 30.2 26.7 28.5  MCHC 32.8 28.8* 30.7  RDW 15.2 15.4 15.3  PLT 279 272 287    Cardiac Enzymes Recent Labs  Lab 08/27/17 0502 08/28/17 0929  TROPONINI 0.61* 0.38*    Recent Labs  Lab 08/26/17 2101  TROPIPOC 0.34*     BNP Recent Labs  Lab 08/26/17 2051  BNP 533.3*     DDimer  Recent Labs  Lab 08/27/17 0502  DDIMER 0.99*     Radiology    No results found.  Cardiac Studies     Patient Profile     72 y.o. male   Assessment & Plan    1.  Coronary artery disease: The patient is not having any angina.  He has a minimally elevated troponin level which is probably due to demand ischemia.  He has lots of other issues.  He is a very poor candidate for invasive procedures and we do not plan any ischemic workup at this time.  2.  Suspected diastolic dysfunction-diastolic congestive heart failure We have had him on IV Lasix.  He is diuresed fairly nicely.   His BUN and creatinine are both up.  Will hold his IV Lasix and consider starting him on p.o. Lasix in the next day or so.  At home he takes Lasix 20 mg a day.    For questions or updates, please contact [place] Please consult [email or web address] for contact info under Cardiology/STEMI.      Signed, Mertie Moores, MD  08/29/2017, 12:01 PM

## 2017-08-29 NOTE — Consult Note (Signed)
   [facility] Inpatient Consult   08/29/2017  [name] Jul 26, 1946 [id number]    Patient screened for potential [facility] Care Management services due to multiple hospitalizations.   Went to bedside to confirm Primary Care Provider. Wife was not at bedside.   Mr. Samples confirms his Primary Care MD is Dr. Felton Clinton who is not a [facility] Provider.    Marthenia Rolling, MSN-Ed, RN,BSN [facility] Liaison [phone]

## 2017-08-29 NOTE — Progress Notes (Signed)
PROGRESS NOTE    [name]  PYP:[id number] DOB: September 16, 1945 DOA: 08/26/2017 PCP: Bernell List, MD  Outpatient Specialists:    Brief Narrative: Patient is a 72 y.o. male with hx of depression, past hx etoh abuse, DM, CAD sp CABG, HL, HTN, seizure d/o, OSA and CVA, memory impairment. Patient was admitted with SOB, diarrhea and distended abdomen.  In ED CXR showed +early CHF.  CT was done for distended abdomen and plain film showing distended SB, but on CT abd the SB was normal.  Trop was high at 0.61. Cardiology was called by ED MD and they suggested IV heparin for poss nstemi and admit patient to [facility] for further assessment of elevated troponin. Cardiology does not plan any further cardiac work up. Troponin elevation is thought to be Type 2. Patient has chronic diarrhea following near total colectomy (Patient has history of diverticulitis).   Gastroenterology input is appreciated. Stool analysis is positive for C. Diff. Patient is now on oral Vancomycin. Will hold antidiarrheal agents. No chest pain or SOB. Lasix is on hold due to rising creatinine. Case management will be consulted to assist with discharge needs.   Assessment & Plan:   Principal Problem:   NSTEMI (non-ST elevated myocardial infarction) ([place]) Active Problems:   History of seizure disorder   Depression   Diabetes mellitus ([place])   Seizure disorder (HCC)   Dyspnea   Chronic respiratory failure, unspecified whether with hypoxia or hypercapnia (HCC)   HLD (hyperlipidemia)   Essential hypertension   Coronary artery disease involving coronary bypass graft of native heart with angina pectoris ([place])   History of CVA with residual deficit   General weakness   Acute CHF (congestive heart failure) (HCC)   Pressure injury of skin   Assessment and Plan:  1. C diff colitis - Stop anti diarrheal agents. Oral Vancomycin. Pursue disposition. 2. Dyspnea - Resolved. Possibly due to acute diastolic CHF, new onset for this patient.   Hx CABG 2005.   3. Elevated Troponin - Type 2 elevation. No further Cardiac work up. 4. Chronic diarrhea -         History of Diverticulitis S/P near total colectomy with subsequent short gut syndrome.     5. DM 2 - on po meds x 1 only  6. Dementia - subsequent to CVA 7. Depression - cont meds 8. Seizure d/o - cont lamictal   DVT prophylaxis: [place] Lovenox Code Status: Full Family Communication:  Disposition Plan: Case management consulted to assist with discharge needs  Consultants:   Cardiology  GI  Procedures:    ECHO  Antimicrobials:   None   Subjective: No new complaints  Objective: Vitals:   08/28/17 1939 08/29/17 0417 08/29/17 0859 08/29/17 1500  BP: (!) 94/53 (!) 101/51  (!) 135/57  Pulse: 72 64 85 100  Resp: (!) 23  20 16   Temp: 98.4 F (36.9 C) 98.2 F (36.8 C)  98.1 F (36.7 C)  TempSrc: Axillary Oral  Oral  SpO2: 97% 97% 97% 100%  Weight:  90.7 kg (199 lb 14.4 oz)    Height:        Intake/Output Summary (Last 24 hours) at 08/29/2017 1859 Last data filed at 08/29/2017 1843 Gross per 24 hour  Intake 1299.23 ml  Output 1400 ml  Net -100.77 ml   Filed Weights   08/26/17 2104 08/28/17 0622 08/29/17 0417  Weight: 95.3 kg (210 lb) 91.2 kg (201 lb 1.6 oz) 90.7 kg (199 lb 14.4 oz)  Examination:  General exam: Appears calm and comfortable  Respiratory system: Clear to auscultation. Respiratory effort normal. Cardiovascular system: S1 & S2 heard, RRR. No JVD, murmurs, rubs, gallops or clicks. No pedal edema. Gastrointestinal system: Abdomen is obese, soft and nontender. No organomegaly or masses felt. Normal bowel sounds heard. Central nervous system: Alert and oriented. No focal neurological deficits. Extremities: Symmetric 5 x 5 power.  Data Reviewed: I have personally reviewed following labs and imaging studies  CBC: Recent Labs  Lab 08/26/17 2051 08/27/17 0503 08/28/17 0346 08/29/17 0407  WBC 13.7* 12.4* 12.0* 10.0  HGB 10.9* 10.7* 10.2*  10.3*  HCT 34.5* 32.6* 35.4* 33.5*  MCV 92.0 92.1 92.7 92.8  PLT 321 279 272 315   Basic Metabolic Panel: Recent Labs  Lab 08/26/17 2210 08/27/17 0503 08/28/17 0929  NA 136 139 138  K 4.5 3.6 4.0  CL 103 102 100*  CO2 25 27 29   GLUCOSE 104* 105* 190*  BUN 13 12 15   CREATININE 0.92 0.91 1.21  CALCIUM 8.5* 8.5* 8.4*  MG  --   --  2.0   GFR: Estimated Creatinine Clearance: 57.4 mL/min (by C-G formula based on SCr of 1.21 mg/dL). Liver Function Tests: Recent Labs  Lab 08/26/17 2210 08/28/17 0929  AST 59* 40  ALT 84* 59  ALKPHOS 138* 109  BILITOT 0.7 0.7  PROT 6.9 6.1*  ALBUMIN 3.2* 3.0*   Recent Labs  Lab 08/26/17 2210  LIPASE 77*   No results for input(s): AMMONIA in the last 168 hours. Coagulation Profile: Recent Labs  Lab 08/27/17 0130  INR 1.14   Cardiac Enzymes: Recent Labs  Lab 08/27/17 0502 08/28/17 0929  TROPONINI 0.61* 0.38*   BNP (last 3 results) No results for input(s): PROBNP in the last 8760 hours. HbA1C: No results for input(s): HGBA1C in the last 72 hours. CBG: Recent Labs  Lab 08/29/17 0022 08/29/17 0414 08/29/17 0815 08/29/17 1150 08/29/17 1653  GLUCAP 108* 121* 95 192* 101*   Lipid Profile: No results for input(s): CHOL, HDL, LDLCALC, TRIG, CHOLHDL, LDLDIRECT in the last 72 hours. Thyroid Function Tests: Recent Labs    08/27/17 0502  TSH 2.717   Anemia Panel: No results for input(s): VITAMINB12, FOLATE, FERRITIN, TIBC, IRON, RETICCTPCT in the last 72 hours. Urine analysis:    Component Value Date/Time   COLORURINE YELLOW 08/26/2017 2218   APPEARANCEUR CLEAR 08/26/2017 2218   LABSPEC 1.009 08/26/2017 2218   PHURINE 5.0 08/26/2017 2218   GLUCOSEU NEGATIVE 08/26/2017 2218   HGBUR SMALL (A) 08/26/2017 2218   BILIRUBINUR NEGATIVE 08/26/2017 2218   BILIRUBINUR neg 04/27/2014 [place] 08/26/2017 2218   PROTEINUR NEGATIVE 08/26/2017 2218   UROBILINOGEN 0.2 03/07/2015 1005   NITRITE NEGATIVE 08/26/2017  2218   LEUKOCYTESUR NEGATIVE 08/26/2017 2218   Sepsis Labs: @LABRCNTIP (procalcitonin:4,lacticidven:4)  ) Recent Results (from the past 240 hour(s))  Culture, blood (routine x 2)     Status: None (Preliminary result)   Collection Time: 08/26/17 10:20 PM  Result Value Ref Range Status   Specimen Description BLOOD LEFT WRIST  Final   Special Requests   Final    BOTTLES DRAWN AEROBIC AND ANAEROBIC Blood Culture adequate volume   Culture   Final    NO GROWTH 2 DAYS Performed at [facility], [place] [address] [place], [place] [address]    Report Status PENDING  Incomplete  Culture, blood (routine x 2)     Status: None (Preliminary result)   Collection Time: 08/27/17  1:30 AM  Result Value Ref Range Status   Specimen Description BLOOD LEFT ARM  Final   Special Requests   Final    BOTTLES DRAWN AEROBIC AND ANAEROBIC Blood Culture adequate volume   Culture   Final    NO GROWTH 2 DAYS Performed at [facility], [address], [place], [place] [address]    Report Status PENDING  Incomplete  Gastrointestinal Panel by PCR , Stool     Status: None   Collection Time: 08/27/17 12:35 PM  Result Value Ref Range Status   Campylobacter species NOT DETECTED NOT DETECTED Final   Plesimonas shigelloides NOT DETECTED NOT DETECTED Final   Salmonella species NOT DETECTED NOT DETECTED Final   Yersinia enterocolitica NOT DETECTED NOT DETECTED Final   Vibrio species NOT DETECTED NOT DETECTED Final   Vibrio cholerae NOT DETECTED NOT DETECTED Final   Enteroaggregative E coli (EAEC) NOT DETECTED NOT DETECTED Final   Enteropathogenic E coli (EPEC) NOT DETECTED NOT DETECTED Final   Enterotoxigenic E coli (ETEC) NOT DETECTED NOT DETECTED Final   Shiga like toxin producing E coli (STEC) NOT DETECTED NOT DETECTED Final   Shigella/Enteroinvasive E coli (EIEC) NOT DETECTED NOT DETECTED Final   Cryptosporidium NOT DETECTED NOT DETECTED Final   Cyclospora cayetanensis NOT DETECTED NOT DETECTED Final     Entamoeba histolytica NOT DETECTED NOT DETECTED Final   Giardia lamblia NOT DETECTED NOT DETECTED Final   Adenovirus F40/41 NOT DETECTED NOT DETECTED Final   Astrovirus NOT DETECTED NOT DETECTED Final   Norovirus GI/GII NOT DETECTED NOT DETECTED Final   Rotavirus A NOT DETECTED NOT DETECTED Final   Sapovirus (I, II, IV, and V) NOT DETECTED NOT DETECTED Final    Comment: Performed at [facility], [address] [place], [place] [address]  C difficile quick scan w PCR reflex     Status: Abnormal   Collection Time: 08/27/17 12:35 PM  Result Value Ref Range Status   C Diff antigen POSITIVE (A) NEGATIVE Final   C Diff toxin NEGATIVE NEGATIVE Final   C Diff interpretation Results are indeterminate. See PCR results.  Final  C. Diff by PCR, Reflexed     Status: Abnormal   Collection Time: 08/27/17 12:35 PM  Result Value Ref Range Status   Toxigenic C. Difficile by PCR POSITIVE (A) NEGATIVE Final    Comment: Positive for toxigenic C. difficile with little to no toxin production. Only treat if clinical presentation suggests symptomatic illness. CRITICAL RESULT CALLED TO, READ BACK BY AND VERIFIED WITH: RN Awilda Metro [phone] MLM          Radiology Studies: No results found.      Scheduled Meds: . ARIPiprazole  5 mg Oral Daily  . aspirin  324 mg Oral Once  . aspirin  325 mg Oral Daily  . atorvastatin  80 mg Oral QHS  . donepezil  10 mg Oral Daily  . enoxaparin (LOVENOX) injection  40 mg Subcutaneous Q24H  . FLUoxetine  40 mg Oral Daily  . glipiZIDE  2.5 mg Oral BID AC  . insulin aspart  0-8 Units Subcutaneous Q4H  . lamoTRIgine  100 mg Oral BID  . multivitamin with minerals  1 tablet Oral Daily  . potassium citrate  10 mEq Oral BID  . traZODone  100 mg Oral QHS  . vancomycin  125 mg Oral Q6H   Continuous Infusions:    LOS: 2 days    Time spent: 50 Minutes  Dana Allan, MD  [facility] Pager #: [phone] 7PM-7AM contact night  coverage as above

## 2017-08-29 NOTE — Progress Notes (Signed)
Just got called from pharmacy. Patients cdff Ag is positive will start on vanco 125 mg QID

## 2017-08-29 NOTE — Progress Notes (Signed)
[facility] PROGRESS NOTE Subjective Patient and staff report that the diarrhea was better.  Only 4 bowel movements were recorded.  A cardiac workup continues  Objective: Vital signs in last 24 hours: Temp:  [98.2 F (36.8 C)-98.7 F (37.1 C)] 98.2 F (36.8 C) (01/04 0417) Pulse Rate:  [64-85] 85 (01/04 0859) Resp:  [19-23] 20 (01/04 0859) BP: (94-101)/(51-54) 101/51 (01/04 0417) SpO2:  [96 %-97 %] 97 % (01/04 0859) Weight:  [90.7 kg (199 lb 14.4 oz)] 90.7 kg (199 lb 14.4 oz) (01/04 0417) Last BM Date: 08/28/17  Intake/Output from previous day: 01/03 0701 - 01/04 0700 In: 1567.2 [P.O.:1320; I.V.:247.2] Out: 2650 [Urine:2650] Intake/Output this shift: Total I/O In: 240 [P.O.:240] Out: -   PE: General--pleasantly confused  Abdomen--soft and nontender  Lab Results: Recent Labs    08/26/17 2051 08/27/17 0503 08/28/17 0346 08/29/17 0407  WBC 13.7* 12.4* 12.0* 10.0  HGB 10.9* 10.7* 10.2* 10.3*  HCT 34.5* 32.6* 35.4* 33.5*  PLT 321 279 272 287   BMET Recent Labs    08/26/17 2210 08/27/17 0503 08/28/17 0929  NA 136 139 138  K 4.5 3.6 4.0  CL 103 102 100*  CO2 25 27 29   CREATININE 0.92 0.91 1.21   LFT Recent Labs    08/26/17 2210 08/28/17 0929  PROT 6.9 6.1*  AST 59* 40  ALT 84* 59  ALKPHOS 138* 109  BILITOT 0.7 0.7  BILIDIR 0.2  --   IBILI 0.5  --    PT/INR Recent Labs    08/27/17 0130  LABPROT 14.5  INR 1.14   PANCREAS Recent Labs    08/26/17 2210  LIPASE 77*         Studies/Results: No results found.  Medications: I have reviewed the patient's current medications.  Assessment:   1.  Diarrhea.  Due to short bowel syndrome.  Patient status post subtotal colectomy.  He is complained of bowel movements every 2 hours in the past but seems to be better in the hospital for unclear reasons.  He is on chronic Lomotil but has been on Lomotil for a number of years.  He has developed dementia recently and would question if he has  been taking all of his medicines why he should have been. 2.  NSTEMI with history of CAD and CHF.  Cardiology on board 3.  Dementia.  This is recent onset.  Apparently has had some kind of workup at the [place].   Plan: We have not been able to obtain stool until recently.  His diarrhea seems to be much better.  My partner will check on the results of the stool panel.   Nancy Fetter 08/29/2017, 11:53 AM  This note was created using voice recognition software. Minor errors may Have occurred unintentionally.  Pager: [phone] If no answer or after hours call [phone]

## 2017-08-29 NOTE — Progress Notes (Signed)
  Echocardiogram 2D Echocardiogram has been performed.  [name] L Androw 08/29/2017, 11:15 AM

## 2017-08-30 LAB — BASIC METABOLIC PANEL
ANION GAP: 19 — AB (ref 5–15)
BUN: 14 mg/dL (ref 6–20)
CALCIUM: 9.5 mg/dL (ref 8.9–10.3)
CO2: 33 mmol/L — ABNORMAL HIGH (ref 22–32)
Chloride: 105 mmol/L (ref 101–111)
Creatinine, Ser: 0.95 mg/dL (ref 0.61–1.24)
Glucose, Bld: 149 mg/dL — ABNORMAL HIGH (ref 65–99)
POTASSIUM: 4.1 mmol/L (ref 3.5–5.1)
SODIUM: 157 mmol/L — AB (ref 135–145)

## 2017-08-30 LAB — GLUCOSE, CAPILLARY
GLUCOSE-CAPILLARY: 120 mg/dL — AB (ref 65–99)
GLUCOSE-CAPILLARY: 208 mg/dL — AB (ref 65–99)
GLUCOSE-CAPILLARY: 79 mg/dL (ref 65–99)
GLUCOSE-CAPILLARY: 186 mg/dL — AB (ref 65–99)
Glucose-Capillary: 115 mg/dL — ABNORMAL HIGH (ref 65–99)
Glucose-Capillary: 98 mg/dL (ref 65–99)

## 2017-08-30 NOTE — Progress Notes (Signed)
Pt refusing CPAP at this time. RT advised pt that if he changed his mind to notify RN/RT. RT will continue to monitor.

## 2017-08-30 NOTE — Progress Notes (Signed)
Subjective:  No complaints of chest pain   Complains of mild shortness of breath.having less diarrhea today  Objective:  Vital Signs in the last 24 hours: BP 116/65   Pulse 85   Temp 97.9 F (36.6 C) (Axillary)   Resp 14   Ht 5' 6"  (1.676 m)   Wt 82.1 kg (180 lb 16 oz)   SpO2 91%   BMI 29.21 kg/m   Physical Exam: Mildly obese male in no acute distress Lungs:  Clear Cardiac:  Regular rhythm, normal S1 and S2, no S3, 1 to 2/6 systolic murmur at aortic valve Extremities:  No edema present  Intake/Output from previous day: 01/04 0701 - 01/05 0700 In: 720 [P.O.:720] Out: 700 [Urine:700]  Weight Filed Weights   08/28/17 0622 08/29/17 0417 08/30/17 0700  Weight: 91.2 kg (201 lb 1.6 oz) 90.7 kg (199 lb 14.4 oz) 82.1 kg (180 lb 16 oz)    Lab Results: Basic Metabolic Panel: Recent Labs    08/28/17 0929  NA 138  K 4.0  CL 100*  CO2 29  GLUCOSE 190*  BUN 15  CREATININE 1.21   CBC: Recent Labs    08/28/17 0346 08/29/17 0407  WBC 12.0* 10.0  HGB 10.2* 10.3*  HCT 35.4* 33.5*  MCV 92.7 92.8  PLT 272 287   Cardiac Panel (last 3 results) Recent Labs    08/28/17 0929  TROPONINI 0.38*    Telemetry: Sinus rhythm personally reviewed  Assessment/Plan:  1.  CAD with no angina at this time with minimally elevated troponin elevations thought due to demand ischemia and continue current treatment with no further workup planned 2.  Lower limits of normal systolic function with diastolic dysfunction. 3.  Diastolic congestive heart failure-weights are all over the place-she has not had renal function checked in a couple of days so we'll recheck this today and restart furosemide 20 mg daily.      Kerry Hough  MD [facility]  08/30/2017, 11:39 AM

## 2017-08-30 NOTE — Progress Notes (Signed)
PROGRESS NOTE    [name]  KTG:[id number] DOB: [birth year] DOA: 08/26/2017 PCP: Bernell List, MD  Outpatient Specialists:    Brief Narrative: Patient is a 72 y.o. male with hx of depression, past hx etoh abuse, DM, CAD sp CABG, HL, HTN, seizure d/o, OSA and CVA, memory impairment. Patient was admitted with SOB, diarrhea and distended abdomen.  In ED CXR showed +early CHF.  CT was done for distended abdomen and plain film showing distended SB, but on CT abd the SB was normal.  Trop was high at 0.61. Cardiology was called by ED MD and they suggested IV heparin for poss nstemi and admit patient to [facility] for further assessment of elevated troponin. Cardiology does not plan any further cardiac work up. Troponin elevation is thought to be Type 2. Patient has chronic diarrhea following near total colectomy (Patient has history of diverticulitis).   Gastroenterology input is appreciated. Stool analysis is positive for C. Diff. Patient is now on oral Vancomycin. Antidiarrheal agents are on hold. Diarrhea has improved according to patient. Will consult Physical therapy to assess patient so disposition can be planned. Patient's wife has mentioned difficulty looking after patient at home.     Assessment & Plan:   Principal Problem:   NSTEMI (non-ST elevated myocardial infarction) ([place]) Active Problems:   History of seizure disorder   Depression   Diabetes mellitus ([place])   Seizure disorder (HCC)   Dyspnea   Chronic respiratory failure, unspecified whether with hypoxia or hypercapnia (HCC)   HLD (hyperlipidemia)   Essential hypertension   Coronary artery disease involving coronary bypass graft of native heart with angina pectoris ([place])   History of CVA with residual deficit   General weakness   Acute CHF (congestive heart failure) (HCC)   Pressure injury of skin   Assessment and Plan:  1. C diff colitis - Stopped anti diarrheal agents. Oral Vancomycin. Pursue disposition. 2. Dyspnea -  Resolved. Possibly due to acute diastolic CHF, new onset for this patient.  Hx CABG 2005.   3. Elevated Troponin - Type 2 elevation. No further Cardiac work up. 4. Chronic diarrhea -         History of Diverticulitis S/P near total colectomy with subsequent short gut syndrome.     5. DM 2 - on po meds x 1 only  6. Dementia - subsequent to CVA 7. Depression - cont meds 8. Seizure d/o - cont lamictal   DVT prophylaxis: [place] Lovenox Code Status: Full Family Communication:  Disposition Plan: Case management consulted to assist with discharge needs  Consultants:   Cardiology  GI  Procedures:    ECHO  Antimicrobials:   None   Subjective: No new complaints  Objective: Vitals:   08/30/17 0356 08/30/17 0700 08/30/17 0846 08/30/17 1435  BP: (!) 117/50  116/65 122/64  Pulse: 85  85 74  Resp: (!) 22  14 13   Temp: 97.9 F (36.6 C)   98.5 F (36.9 C)  TempSrc: Axillary   Oral  SpO2: 94%  91% 93%  Weight:  82.1 kg (180 lb 16 oz)    Height:        Intake/Output Summary (Last 24 hours) at 08/30/2017 1545 Last data filed at 08/30/2017 1436 Gross per 24 hour  Intake 720 ml  Output 700 ml  Net 20 ml   Filed Weights   08/28/17 0622 08/29/17 0417 08/30/17 0700  Weight: 91.2 kg (201 lb 1.6 oz) 90.7 kg (199 lb 14.4 oz) 82.1 kg (180 lb  16 oz)    Examination:  General exam: Appears calm and comfortable  Respiratory system: Clear to auscultation. Respiratory effort normal. Cardiovascular system: S1 & S2 heard, RRR. No JVD, murmurs, rubs, gallops or clicks. No pedal edema. Gastrointestinal system: Abdomen is obese, soft and nontender. No organomegaly or masses felt. Normal bowel sounds heard. Central nervous system: Alert and oriented. No focal neurological deficits. Extremities: Symmetric 5 x 5 power.  Data Reviewed: I have personally reviewed following labs and imaging studies  CBC: Recent Labs  Lab 08/26/17 2051 08/27/17 0503 08/28/17 0346 08/29/17 0407  WBC 13.7* 12.4*  12.0* 10.0  HGB 10.9* 10.7* 10.2* 10.3*  HCT 34.5* 32.6* 35.4* 33.5*  MCV 92.0 92.1 92.7 92.8  PLT 321 279 272 882   Basic Metabolic Panel: Recent Labs  Lab 08/26/17 2210 08/27/17 0503 08/28/17 0929 08/30/17 1209  NA 136 139 138 157*  K 4.5 3.6 4.0 4.1  CL 103 102 100* 105  CO2 25 27 29  33*  GLUCOSE 104* 105* 190* 149*  BUN 13 12 15 14   CREATININE 0.92 0.91 1.21 0.95  CALCIUM 8.5* 8.5* 8.4* 9.5  MG  --   --  2.0  --    GFR: Estimated Creatinine Clearance: 69.6 mL/min (by C-G formula based on SCr of 0.95 mg/dL). Liver Function Tests: Recent Labs  Lab 08/26/17 2210 08/28/17 0929  AST 59* 40  ALT 84* 59  ALKPHOS 138* 109  BILITOT 0.7 0.7  PROT 6.9 6.1*  ALBUMIN 3.2* 3.0*   Recent Labs  Lab 08/26/17 2210  LIPASE 77*   No results for input(s): AMMONIA in the last 168 hours. Coagulation Profile: Recent Labs  Lab 08/27/17 0130  INR 1.14   Cardiac Enzymes: Recent Labs  Lab 08/27/17 0502 08/28/17 0929  TROPONINI 0.61* 0.38*   BNP (last 3 results) No results for input(s): PROBNP in the last 8760 hours. HbA1C: No results for input(s): HGBA1C in the last 72 hours. CBG: Recent Labs  Lab 08/29/17 2045 08/30/17 0008 08/30/17 0401 08/30/17 0741 08/30/17 1139  GLUCAP 182* 98 115* 120* 208*   Lipid Profile: No results for input(s): CHOL, HDL, LDLCALC, TRIG, CHOLHDL, LDLDIRECT in the last 72 hours. Thyroid Function Tests: No results for input(s): TSH, T4TOTAL, FREET4, T3FREE, THYROIDAB in the last 72 hours. Anemia Panel: No results for input(s): VITAMINB12, FOLATE, FERRITIN, TIBC, IRON, RETICCTPCT in the last 72 hours. Urine analysis:    Component Value Date/Time   COLORURINE YELLOW 08/26/2017 2218   APPEARANCEUR CLEAR 08/26/2017 2218   LABSPEC 1.009 08/26/2017 2218   PHURINE 5.0 08/26/2017 2218   GLUCOSEU NEGATIVE 08/26/2017 2218   HGBUR SMALL (A) 08/26/2017 2218   BILIRUBINUR NEGATIVE 08/26/2017 2218   BILIRUBINUR neg 04/27/2014 [place] 08/26/2017 2218   PROTEINUR NEGATIVE 08/26/2017 2218   UROBILINOGEN 0.2 03/07/2015 1005   NITRITE NEGATIVE 08/26/2017 2218   LEUKOCYTESUR NEGATIVE 08/26/2017 2218   Sepsis Labs: @LABRCNTIP (procalcitonin:4,lacticidven:4)  ) Recent Results (from the past 240 hour(s))  Culture, blood (routine x 2)     Status: None (Preliminary result)   Collection Time: 08/26/17 10:20 PM  Result Value Ref Range Status   Specimen Description BLOOD LEFT WRIST  Final   Special Requests   Final    BOTTLES DRAWN AEROBIC AND ANAEROBIC Blood Culture adequate volume   Culture   Final    NO GROWTH 3 DAYS Performed at [facility], [place] [address] [place], [place] [address]    Report Status PENDING  Incomplete  Culture, blood (routine x 2)     Status: None (Preliminary result)   Collection Time: 08/27/17  1:30 AM  Result Value Ref Range Status   Specimen Description BLOOD LEFT ARM  Final   Special Requests   Final    BOTTLES DRAWN AEROBIC AND ANAEROBIC Blood Culture adequate volume   Culture   Final    NO GROWTH 3 DAYS Performed at [facility], [address] [place], [place] [address]    Report Status PENDING  Incomplete  Gastrointestinal Panel by PCR , Stool     Status: None   Collection Time: 08/27/17 12:35 PM  Result Value Ref Range Status   Campylobacter species NOT DETECTED NOT DETECTED Final   Plesimonas shigelloides NOT DETECTED NOT DETECTED Final   Salmonella species NOT DETECTED NOT DETECTED Final   Yersinia enterocolitica NOT DETECTED NOT DETECTED Final   Vibrio species NOT DETECTED NOT DETECTED Final   Vibrio cholerae NOT DETECTED NOT DETECTED Final   Enteroaggregative E coli (EAEC) NOT DETECTED NOT DETECTED Final   Enteropathogenic E coli (EPEC) NOT DETECTED NOT DETECTED Final   Enterotoxigenic E coli (ETEC) NOT DETECTED NOT DETECTED Final   Shiga like toxin producing E coli (STEC) NOT DETECTED NOT DETECTED Final   Shigella/Enteroinvasive E coli (EIEC) NOT DETECTED  NOT DETECTED Final   Cryptosporidium NOT DETECTED NOT DETECTED Final   Cyclospora cayetanensis NOT DETECTED NOT DETECTED Final   Entamoeba histolytica NOT DETECTED NOT DETECTED Final   Giardia lamblia NOT DETECTED NOT DETECTED Final   Adenovirus F40/41 NOT DETECTED NOT DETECTED Final   Astrovirus NOT DETECTED NOT DETECTED Final   Norovirus GI/GII NOT DETECTED NOT DETECTED Final   Rotavirus A NOT DETECTED NOT DETECTED Final   Sapovirus (I, II, IV, and V) NOT DETECTED NOT DETECTED Final    Comment: Performed at [facility], [address] [place], [place] [address]  C difficile quick scan w PCR reflex     Status: Abnormal   Collection Time: 08/27/17 12:35 PM  Result Value Ref Range Status   C Diff antigen POSITIVE (A) NEGATIVE Final   C Diff toxin NEGATIVE NEGATIVE Final   C Diff interpretation Results are indeterminate. See PCR results.  Final  C. Diff by PCR, Reflexed     Status: Abnormal   Collection Time: 08/27/17 12:35 PM  Result Value Ref Range Status   Toxigenic C. Difficile by PCR POSITIVE (A) NEGATIVE Final    Comment: Positive for toxigenic C. difficile with little to no toxin production. Only treat if clinical presentation suggests symptomatic illness. CRITICAL RESULT CALLED TO, READ BACK BY AND VERIFIED WITH: RN Awilda Metro [phone] MLM          Radiology Studies: No results found.      Scheduled Meds: . ARIPiprazole  5 mg Oral Daily  . aspirin  324 mg Oral Once  . aspirin  325 mg Oral Daily  . atorvastatin  80 mg Oral QHS  . donepezil  10 mg Oral Daily  . enoxaparin (LOVENOX) injection  40 mg Subcutaneous Q24H  . FLUoxetine  40 mg Oral Daily  . glipiZIDE  2.5 mg Oral BID AC  . insulin aspart  0-8 Units Subcutaneous Q4H  . lamoTRIgine  100 mg Oral BID  . multivitamin with minerals  1 tablet Oral Daily  . potassium citrate  10 mEq Oral BID  . traZODone  100 mg Oral QHS  . vancomycin  125 mg Oral Q6H  Continuous Infusions:    LOS: 3 days     Time spent: 27 Minutes    Dana Allan, MD  [facility] Pager #: [phone] 7PM-7AM contact night coverage as above

## 2017-08-30 NOTE — Progress Notes (Signed)
Subjective: Diarrhea improving on vancomycin.  Objective: Vital signs in last 24 hours: Temp:  [97.9 F (36.6 C)-98.9 F (37.2 C)] 97.9 F (36.6 C) (01/05 0356) Pulse Rate:  [71-100] 85 (01/05 0846) Resp:  [14-22] 14 (01/05 0846) BP: (111-135)/(50-65) 116/65 (01/05 0846) SpO2:  [91 %-100 %] 91 % (01/05 0846) Weight:  [180 lb 16 oz (82.1 kg)] 180 lb 16 oz (82.1 kg) (01/05 0700) Weight change: -14.4 oz (-8.574 kg) Last BM Date: 08/29/17  PE: GEN:  Chronically ill-appearing, tachypnea (chronic per patient) ABD:  Soft, protuberant, non-tender  Lab Results: C. Diff toxin neg, PCR positive CBC    Component Value Date/Time   WBC 10.0 08/29/2017 0407   RBC 3.61 (L) 08/29/2017 0407   HGB 10.3 (L) 08/29/2017 0407   HCT 33.5 (L) 08/29/2017 0407   PLT 287 08/29/2017 0407   MCV 92.8 08/29/2017 0407   MCV 98.1 (A) 05/30/2014 1239   MCH 28.5 08/29/2017 0407   MCHC 30.7 08/29/2017 0407   RDW 15.3 08/29/2017 0407   LYMPHSABS 1.4 04/24/2017 1409   MONOABS 1.0 04/24/2017 1409   EOSABS 0.1 04/24/2017 1409   BASOSABS 0.0 04/24/2017 1409   CMP     Component Value Date/Time   NA 138 08/28/2017 0929   K 4.0 08/28/2017 0929   CL 100 (L) 08/28/2017 0929   CO2 29 08/28/2017 0929   GLUCOSE 190 (H) 08/28/2017 0929   BUN 15 08/28/2017 0929   CREATININE 1.21 08/28/2017 0929   CREATININE 0.96 01/04/2016 1058   CALCIUM 8.4 (L) 08/28/2017 0929   PROT 6.1 (L) 08/28/2017 0929   ALBUMIN 3.0 (L) 08/28/2017 0929   AST 40 08/28/2017 0929   ALT 59 08/28/2017 0929   ALKPHOS 109 08/28/2017 0929   BILITOT 0.7 08/28/2017 0929   GFRNONAA 58 (L) 08/28/2017 0929   GFRNONAA 79 01/04/2016 1058   GFRAA >60 08/28/2017 0929   GFRAA >89 01/04/2016 1058   Assessment:  1.  Diarrhea, acute on chronic.  C. Diff results are indeterminate, but he is improving on vancomycin, which argues that toxin - on stool was likely false negative.' 2.  Crohn's disease. 3.  Colonic resection, diverticulitis +/- IBD years  ago. 4.  Multiple cardiopulmonary comorbidities.  Plan:  1.  Complete 10-day course of vancomycin 125 mg po QID. 2.  Advance diet as tolerated. 3.  PT consult? 4.  No further GI recommendations at this time. 5.  [facility] will sign-off; patient can follow-up with Dr. [name] in 3-4 weeks as outpatient; please call with questions; thank you for the consult.    Landry Dyke 08/30/2017, 1:02 PM   Cell [phone] If no answer or after 5 PM call [phone]

## 2017-08-31 LAB — BASIC METABOLIC PANEL
Anion gap: 7 (ref 5–15)
BUN: 17 mg/dL (ref 6–20)
CALCIUM: 8.6 mg/dL — AB (ref 8.9–10.3)
CO2: 29 mmol/L (ref 22–32)
CREATININE: 0.9 mg/dL (ref 0.61–1.24)
Chloride: 99 mmol/L — ABNORMAL LOW (ref 101–111)
GFR calc non Af Amer: >60 mL/min (ref >60)
Glucose, Bld: 109 mg/dL — ABNORMAL HIGH (ref 65–99)
Potassium: 4.2 mmol/L (ref 3.5–5.1)
SODIUM: 135 mmol/L (ref 135–145)

## 2017-08-31 LAB — GLUCOSE, CAPILLARY
GLUCOSE-CAPILLARY: 92 mg/dL (ref 65–99)
Glucose-Capillary: 114 mg/dL — ABNORMAL HIGH (ref 65–99)
Glucose-Capillary: 124 mg/dL — ABNORMAL HIGH (ref 65–99)
Glucose-Capillary: 186 mg/dL — ABNORMAL HIGH (ref 65–99)
Glucose-Capillary: 135 mg/dL — ABNORMAL HIGH (ref 65–99)
Glucose-Capillary: 131 mg/dL — ABNORMAL HIGH (ref 65–99)

## 2017-08-31 NOTE — Progress Notes (Signed)
PROGRESS NOTE    [name]  NOI:[id number] DOB: 1945-12-01 DOA: 08/26/2017 PCP: Bernell List, MD  Outpatient Specialists:    Brief Narrative: Patient is a 72 y.o. male with hx of depression, past hx etoh abuse, DM, CAD sp CABG, HL, HTN, seizure d/o, OSA and CVA, memory impairment. Patient was admitted with SOB, diarrhea and distended abdomen.  In ED CXR showed +early CHF.  CT was done for distended abdomen and plain film showing distended SB, but on CT abd the SB was normal.  Trop was high at 0.61. Cardiology was called by ED MD and they suggested IV heparin for poss nstemi and admit patient to [facility] for further assessment of elevated troponin. Cardiology does not plan any further cardiac work up. Troponin elevation is thought to be Type 2. Patient has chronic diarrhea following near total colectomy (Patient has history of diverticulitis).   Gastroenterology input is appreciated. Stool analysis is positive for C. Diff. Patient is now on oral Vancomycin. Antidiarrheal agents are on hold. Diarrhea has improved according to patient. Physical therapy consulted to assess patient so disposition can be planned. Patient's wife has mentioned difficulty looking after patient at home.     Reports feeling sleepy today. Diarrhea is controlled. Pursue PT evaluation and disposition.  Assessment & Plan:   Principal Problem:   NSTEMI (non-ST elevated myocardial infarction) ([place]) Active Problems:   History of seizure disorder   Depression   Diabetes mellitus ([place])   Seizure disorder (HCC)   Dyspnea   Chronic respiratory failure, unspecified whether with hypoxia or hypercapnia (HCC)   HLD (hyperlipidemia)   Essential hypertension   Coronary artery disease involving coronary bypass graft of native heart with angina pectoris ([place])   History of CVA with residual deficit   General weakness   Acute CHF (congestive heart failure) (HCC)   Pressure injury of skin   Assessment and Plan:  1. C diff colitis  - Stopped anti diarrheal agents. Oral Vancomycin. Pursue disposition. 2. Dyspnea - Resolved. Possibly due to acute diastolic CHF, new onset for this patient.  Hx CABG 2005.   3. Elevated Troponin - Type 2 elevation. No further Cardiac work up. 4. Chronic diarrhea -         History of Diverticulitis S/P near total colectomy with subsequent short gut syndrome.     5. DM 2 - on po meds x 1 only  6. Dementia - subsequent to CVA 7. Depression - cont meds 8. Seizure d/o - cont lamictal   DVT prophylaxis: [place] Lovenox Code Status: Full Family Communication:  Disposition Plan: Case management consulted to assist with discharge needs  Consultants:   Cardiology  GI  Procedures:    ECHO  Antimicrobials:   None   Subjective: No new complaints  Objective: Vitals:   08/30/17 1435 08/30/17 1944 08/31/17 0506 08/31/17 1605  BP: 122/64 (!) 145/65 (!) 115/59 125/62  Pulse: 74 81 69 75  Resp: 13 20 15  (!) 26  Temp: 98.5 F (36.9 C) 98.8 F (37.1 C) 98 F (36.7 C) 98.1 F (36.7 C)  TempSrc: Oral Axillary Oral Oral  SpO2: 93% 96% 94% 99%  Weight:   89.3 kg (196 lb 12.8 oz)   Height:        Intake/Output Summary (Last 24 hours) at 08/31/2017 1620 Last data filed at 08/31/2017 1607 Gross per 24 hour  Intake 1110 ml  Output 1350 ml  Net -240 ml   Filed Weights   08/29/17 0417 08/30/17 0700 08/31/17  0506  Weight: 90.7 kg (199 lb 14.4 oz) 82.1 kg (180 lb 16 oz) 89.3 kg (196 lb 12.8 oz)    Examination:  General exam: Appears calm and comfortable  Respiratory system: Clear to auscultation. Respiratory effort normal. Cardiovascular system: S1 & S2 heard, RRR. No JVD, murmurs, rubs, gallops or clicks. No pedal edema. Gastrointestinal system: Abdomen is obese, soft and nontender. No organomegaly or masses felt. Normal bowel sounds heard. Central nervous system: Alert and oriented. No focal neurological deficits. Extremities: Symmetric 5 x 5 power.  Data Reviewed: I have personally  reviewed following labs and imaging studies  CBC: Recent Labs  Lab 08/26/17 2051 08/27/17 0503 08/28/17 0346 08/29/17 0407  WBC 13.7* 12.4* 12.0* 10.0  HGB 10.9* 10.7* 10.2* 10.3*  HCT 34.5* 32.6* 35.4* 33.5*  MCV 92.0 92.1 92.7 92.8  PLT 321 279 272 416   Basic Metabolic Panel: Recent Labs  Lab 08/26/17 2210 08/27/17 0503 08/28/17 0929 08/30/17 1209 08/31/17 0311  NA 136 139 138 157* 135  K 4.5 3.6 4.0 4.1 4.2  CL 103 102 100* 105 99*  CO2 25 27 29  33* 29  GLUCOSE 104* 105* 190* 149* 109*  BUN 13 12 15 14 17   CREATININE 0.92 0.91 1.21 0.95 0.90  CALCIUM 8.5* 8.5* 8.4* 9.5 8.6*  MG  --   --  2.0  --   --    GFR: Estimated Creatinine Clearance: 76.5 mL/min (by C-G formula based on SCr of 0.9 mg/dL). Liver Function Tests: Recent Labs  Lab 08/26/17 2210 08/28/17 0929  AST 59* 40  ALT 84* 59  ALKPHOS 138* 109  BILITOT 0.7 0.7  PROT 6.9 6.1*  ALBUMIN 3.2* 3.0*   Recent Labs  Lab 08/26/17 2210  LIPASE 77*   No results for input(s): AMMONIA in the last 168 hours. Coagulation Profile: Recent Labs  Lab 08/27/17 0130  INR 1.14   Cardiac Enzymes: Recent Labs  Lab 08/27/17 0502 08/28/17 0929  TROPONINI 0.61* 0.38*   BNP (last 3 results) No results for input(s): PROBNP in the last 8760 hours. HbA1C: No results for input(s): HGBA1C in the last 72 hours. CBG: Recent Labs  Lab 08/30/17 2335 08/31/17 0506 08/31/17 0733 08/31/17 1124 08/31/17 1604  GLUCAP 92 114* 124* 186* 135*   Lipid Profile: No results for input(s): CHOL, HDL, LDLCALC, TRIG, CHOLHDL, LDLDIRECT in the last 72 hours. Thyroid Function Tests: No results for input(s): TSH, T4TOTAL, FREET4, T3FREE, THYROIDAB in the last 72 hours. Anemia Panel: No results for input(s): VITAMINB12, FOLATE, FERRITIN, TIBC, IRON, RETICCTPCT in the last 72 hours. Urine analysis:    Component Value Date/Time   COLORURINE YELLOW 08/26/2017 2218   APPEARANCEUR CLEAR 08/26/2017 2218   LABSPEC 1.009  08/26/2017 2218   PHURINE 5.0 08/26/2017 2218   GLUCOSEU NEGATIVE 08/26/2017 2218   HGBUR SMALL (A) 08/26/2017 2218   BILIRUBINUR NEGATIVE 08/26/2017 2218   BILIRUBINUR neg 04/27/2014 [place] 08/26/2017 2218   PROTEINUR NEGATIVE 08/26/2017 2218   UROBILINOGEN 0.2 03/07/2015 1005   NITRITE NEGATIVE 08/26/2017 2218   LEUKOCYTESUR NEGATIVE 08/26/2017 2218   Sepsis Labs: @LABRCNTIP (procalcitonin:4,lacticidven:4)  ) Recent Results (from the past 240 hour(s))  Culture, blood (routine x 2)     Status: None (Preliminary result)   Collection Time: 08/26/17 10:20 PM  Result Value Ref Range Status   Specimen Description BLOOD LEFT WRIST  Final   Special Requests   Final    BOTTLES DRAWN AEROBIC AND ANAEROBIC Blood Culture adequate volume  Culture   Final    NO GROWTH 4 DAYS Performed at [facility], [place] [address], [place], [place] [address]    Report Status PENDING  Incomplete  Culture, blood (routine x 2)     Status: None (Preliminary result)   Collection Time: 08/27/17  1:30 AM  Result Value Ref Range Status   Specimen Description BLOOD LEFT ARM  Final   Special Requests   Final    BOTTLES DRAWN AEROBIC AND ANAEROBIC Blood Culture adequate volume   Culture   Final    NO GROWTH 4 DAYS Performed at [facility], [place] [address] [place], [place] [address]    Report Status PENDING  Incomplete  Giardia/Cryptosporidium EIA     Status: None (Preliminary result)   Collection Time: 08/27/17 12:35 PM  Result Value Ref Range Status   Giardia Ag, Stl PENDING  Incomplete   Cryptosporidium EIA Negative Negative Final    Comment: (NOTE) Performed At: [facility] [place], [place] [id number] Rush Farmer MD [id number]    Source of Sample STOOL  Final  Gastrointestinal Panel by PCR , Stool     Status: None   Collection Time: 08/27/17 12:35 PM  Result Value Ref Range Status   Campylobacter species NOT DETECTED NOT DETECTED Final     Plesimonas shigelloides NOT DETECTED NOT DETECTED Final   Salmonella species NOT DETECTED NOT DETECTED Final   Yersinia enterocolitica NOT DETECTED NOT DETECTED Final   Vibrio species NOT DETECTED NOT DETECTED Final   Vibrio cholerae NOT DETECTED NOT DETECTED Final   Enteroaggregative E coli (EAEC) NOT DETECTED NOT DETECTED Final   Enteropathogenic E coli (EPEC) NOT DETECTED NOT DETECTED Final   Enterotoxigenic E coli (ETEC) NOT DETECTED NOT DETECTED Final   Shiga like toxin producing E coli (STEC) NOT DETECTED NOT DETECTED Final   Shigella/Enteroinvasive E coli (EIEC) NOT DETECTED NOT DETECTED Final   Cryptosporidium NOT DETECTED NOT DETECTED Final   Cyclospora cayetanensis NOT DETECTED NOT DETECTED Final   Entamoeba histolytica NOT DETECTED NOT DETECTED Final   Giardia lamblia NOT DETECTED NOT DETECTED Final   Adenovirus F40/41 NOT DETECTED NOT DETECTED Final   Astrovirus NOT DETECTED NOT DETECTED Final   Norovirus GI/GII NOT DETECTED NOT DETECTED Final   Rotavirus A NOT DETECTED NOT DETECTED Final   Sapovirus (I, II, IV, and V) NOT DETECTED NOT DETECTED Final    Comment: Performed at [facility], [place] [place], [place] [address]  C difficile quick scan w PCR reflex     Status: Abnormal   Collection Time: 08/27/17 12:35 PM  Result Value Ref Range Status   C Diff antigen POSITIVE (A) NEGATIVE Final   C Diff toxin NEGATIVE NEGATIVE Final   C Diff interpretation Results are indeterminate. See PCR results.  Final  C. Diff by PCR, Reflexed     Status: Abnormal   Collection Time: 08/27/17 12:35 PM  Result Value Ref Range Status   Toxigenic C. Difficile by PCR POSITIVE (A) NEGATIVE Final    Comment: Positive for toxigenic C. difficile with little to no toxin production. Only treat if clinical presentation suggests symptomatic illness. CRITICAL RESULT CALLED TO, READ BACK BY AND VERIFIED WITH: RN Awilda Metro [phone]          Radiology Studies: No  results found.      Scheduled Meds: . ARIPiprazole  5 mg Oral Daily  . aspirin  324 mg Oral Once  .  aspirin  325 mg Oral Daily  . atorvastatin  80 mg Oral QHS  . donepezil  10 mg Oral Daily  . enoxaparin (LOVENOX) injection  40 mg Subcutaneous Q24H  . FLUoxetine  40 mg Oral Daily  . glipiZIDE  2.5 mg Oral BID AC  . insulin aspart  0-8 Units Subcutaneous Q4H  . lamoTRIgine  100 mg Oral BID  . multivitamin with minerals  1 tablet Oral Daily  . potassium citrate  10 mEq Oral BID  . traZODone  100 mg Oral QHS  . vancomycin  125 mg Oral Q6H   Continuous Infusions:    LOS: 4 days    Time spent: 16 Minutes    Dana Allan, MD  [facility] Pager #: [phone] 7PM-7AM contact night coverage as above

## 2017-08-31 NOTE — Progress Notes (Signed)
Subjective:  No complaints of chest pain or shortness of breath today.  Somewhat somnolent and sleeping today.  Objective:  Vital Signs in the last 24 hours: BP (!) 115/59 (BP Location: Left Arm)   Pulse 69   Temp 98 F (36.7 C) (Oral)   Resp 15   Ht 5' 6"  (1.676 m)   Wt 89.3 kg (196 lb 12.8 oz)   SpO2 94%   BMI 31.76 kg/m   Physical Exam: Mildly obese male in no acute distress Lungs:  Clear Cardiac:  Regular rhythm, normal S1 and S2, no S3, 1 to 2/6 systolic murmur at aortic valve Extremities:  No edema present  Intake/Output from previous day: 01/05 0701 - 01/06 0700 In: 1110 [P.O.:1110] Out: 950 [Urine:950]  Weight Filed Weights   08/29/17 0417 08/30/17 0700 08/31/17 0506  Weight: 90.7 kg (199 lb 14.4 oz) 82.1 kg (180 lb 16 oz) 89.3 kg (196 lb 12.8 oz)    Lab Results: Basic Metabolic Panel: Recent Labs    08/30/17 1209 08/31/17 0311  NA 157* 135  K 4.1 4.2  CL 105 99*  CO2 33* 29  GLUCOSE 149* 109*  BUN 14 17  CREATININE 0.95 0.90   CBC: Recent Labs    08/29/17 0407  WBC 10.0  HGB 10.3*  HCT 33.5*  MCV 92.8  PLT 287    Telemetry: Sinus rhythm personally reviewed  Assessment/Plan:  1.  CAD with no angina at this time with minimally elevated troponin elevations thought due to demand ischemia and continue current treatment with no further workup planned 2.  Lower limits of normal systolic function with diastolic dysfunction. 3.  Diastolic congestive heart failure-weights are still all over the place-renal function looks good today.  Continue oral medications at this time.    Kerry Hough  MD [facility] Cardiology  08/31/2017, 10:34 AM

## 2017-08-31 NOTE — Progress Notes (Signed)
PT Cancellation Note  Patient Details Name: [name] MRN: [id number] DOB: Aug 24, 1946   Cancelled Treatment:    Reason Eval/Treat Not Completed: Fatigue/lethargy limiting ability to participate - pt sleeping soundly and declined attempt at mobility -- will reattempt next date to complete eval and make recommendations.   [facility] 08/31/2017, 3:05 PM

## 2017-09-01 DIAGNOSIS — I6930 Unspecified sequelae of cerebral infarction: Secondary | ICD-10-CM | POA: Diagnosis not present

## 2017-09-01 DIAGNOSIS — I5031 Acute diastolic (congestive) heart failure: Secondary | ICD-10-CM

## 2017-09-01 DIAGNOSIS — I10 Essential (primary) hypertension: Secondary | ICD-10-CM

## 2017-09-01 DIAGNOSIS — I25709 Atherosclerosis of coronary artery bypass graft(s), unspecified, with unspecified angina pectoris: Secondary | ICD-10-CM

## 2017-09-01 DIAGNOSIS — R41841 Cognitive communication deficit: Secondary | ICD-10-CM | POA: Diagnosis not present

## 2017-09-01 DIAGNOSIS — K529 Noninfective gastroenteritis and colitis, unspecified: Secondary | ICD-10-CM | POA: Diagnosis not present

## 2017-09-01 DIAGNOSIS — Z8669 Personal history of other diseases of the nervous system and sense organs: Secondary | ICD-10-CM | POA: Diagnosis not present

## 2017-09-01 DIAGNOSIS — E118 Type 2 diabetes mellitus with unspecified complications: Secondary | ICD-10-CM | POA: Diagnosis not present

## 2017-09-01 DIAGNOSIS — J80 Acute respiratory distress syndrome: Secondary | ICD-10-CM | POA: Diagnosis not present

## 2017-09-01 DIAGNOSIS — I5033 Acute on chronic diastolic (congestive) heart failure: Secondary | ICD-10-CM | POA: Diagnosis not present

## 2017-09-01 DIAGNOSIS — E785 Hyperlipidemia, unspecified: Secondary | ICD-10-CM | POA: Diagnosis not present

## 2017-09-01 DIAGNOSIS — G40909 Epilepsy, unspecified, not intractable, without status epilepticus: Secondary | ICD-10-CM | POA: Diagnosis not present

## 2017-09-01 DIAGNOSIS — R531 Weakness: Secondary | ICD-10-CM

## 2017-09-01 DIAGNOSIS — R0609 Other forms of dyspnea: Secondary | ICD-10-CM | POA: Diagnosis not present

## 2017-09-01 DIAGNOSIS — M6281 Muscle weakness (generalized): Secondary | ICD-10-CM | POA: Diagnosis not present

## 2017-09-01 DIAGNOSIS — J9611 Chronic respiratory failure with hypoxia: Secondary | ICD-10-CM | POA: Diagnosis not present

## 2017-09-01 DIAGNOSIS — I214 Non-ST elevation (NSTEMI) myocardial infarction: Secondary | ICD-10-CM | POA: Diagnosis not present

## 2017-09-01 DIAGNOSIS — K5090 Crohn's disease, unspecified, without complications: Secondary | ICD-10-CM | POA: Diagnosis not present

## 2017-09-01 DIAGNOSIS — I21A1 Myocardial infarction type 2: Secondary | ICD-10-CM | POA: Diagnosis not present

## 2017-09-01 DIAGNOSIS — J9610 Chronic respiratory failure, unspecified whether with hypoxia or hypercapnia: Secondary | ICD-10-CM | POA: Diagnosis not present

## 2017-09-01 DIAGNOSIS — R69 Illness, unspecified: Secondary | ICD-10-CM

## 2017-09-01 DIAGNOSIS — K591 Functional diarrhea: Secondary | ICD-10-CM | POA: Diagnosis not present

## 2017-09-01 DIAGNOSIS — I509 Heart failure, unspecified: Secondary | ICD-10-CM | POA: Diagnosis not present

## 2017-09-01 DIAGNOSIS — R2689 Other abnormalities of gait and mobility: Secondary | ICD-10-CM | POA: Diagnosis not present

## 2017-09-01 DIAGNOSIS — R498 Other voice and resonance disorders: Secondary | ICD-10-CM | POA: Diagnosis not present

## 2017-09-01 LAB — BASIC METABOLIC PANEL
ANION GAP: 9 (ref 5–15)
BUN: 17 mg/dL (ref 6–20)
CALCIUM: 8.5 mg/dL — AB (ref 8.9–10.3)
CO2: 29 mmol/L (ref 22–32)
Chloride: 100 mmol/L — ABNORMAL LOW (ref 101–111)
Creatinine, Ser: 1.02 mg/dL (ref 0.61–1.24)
GFR calc Af Amer: >60 mL/min (ref >60)
GLUCOSE: 125 mg/dL — AB (ref 65–99)
Potassium: 4.4 mmol/L (ref 3.5–5.1)
SODIUM: 138 mmol/L (ref 135–145)

## 2017-09-01 LAB — CULTURE, BLOOD (ROUTINE X 2)
CULTURE: NO GROWTH
Culture: NO GROWTH
SPECIAL REQUESTS: ADEQUATE
SPECIAL REQUESTS: ADEQUATE

## 2017-09-01 LAB — GLUCOSE, CAPILLARY
GLUCOSE-CAPILLARY: 140 mg/dL — AB (ref 65–99)
GLUCOSE-CAPILLARY: 94 mg/dL (ref 65–99)
Glucose-Capillary: 150 mg/dL — ABNORMAL HIGH (ref 65–99)
Glucose-Capillary: 121 mg/dL — ABNORMAL HIGH (ref 65–99)

## 2017-09-01 MED ORDER — ASPIRIN EC 81 MG PO TBEC: 81.0000 mg | DELAYED_RELEASE_TABLET | Freq: Every day | ORAL | Status: DC

## 2017-09-01 MED ORDER — VANCOMYCIN 50 MG/ML ORAL SOLUTION: 125.0000 mg | Freq: Four times a day (QID) | ORAL | 0 refills | Status: DC

## 2017-09-01 NOTE — Clinical Social Work Note (Addendum)
Clinical Social Work Assessment  Patient Details  Name: [name] MRN: [id number] Date of Birth: 1946/02/03  Date of referral:  09/01/17               Reason for consult:  Facility Placement                Permission sought to share information with:  Facility Sport and exercise psychologist, Family Supports Permission granted to share information::  No(patient awake but not interacting with CSW, wife a bedside)  Name::     [place]::  SNFs  Relationship::  spouse  Contact Information:  [phone]  Housing/Transportation Living arrangements for the past 2 months:  Single Family Home Source of Information:  Spouse Patient Interpreter Needed:  None Criminal Activity/Legal Involvement Pertinent to Current Situation/Hospitalization:  No - Comment as needed Significant Relationships:  Spouse Lives with:  Spouse Do you feel safe going back to the place where you live?  Yes Need for family participation in patient care:  Yes (Comment)  Care giving concerns: Patient from home with wife. PT eval pending, though wife unable to care for patient at home.   Social Worker assessment / plan: CSW met with patient and wife, [name], at bedside. Patient awake but did not interact with CSW. CSW discussed disposition planning with wife. Wife indicated she is unable to care for patient in current mobility state as she has a shoulder injury and will need surgery on her shoulder soon. Wife indicated patient has been in SNF before and would prefer for him to discharge to SNF after this admission.   Wife stated patient has been in rehab at [facility] in the past and requested referral there. CSW submitted referral to [place], though patient may not be screened today given time referral was submitted.  PT evaluation pending; RN reports patient requiring 2 person assist. CSW sent out initial referrals to SNFs. Wife prefers [facility] if patient unable to get into [place]. CSW will follow to support with  discharge.  Employment status:  Retired Forensic scientist:  Information systems manager, [employer] Benefit PT Recommendations:  [place] / Referral to community resources:  [place]  Patient/Family's Response to care: Wife appreciative of care.  Patient/Family's Understanding of and Emotional Response to Diagnosis, Current Treatment, and Prognosis: Wife with understanding of patient's condition and hopeful for SNF placement.  Emotional Assessment Appearance:  Appears stated age Attitude/Demeanor/Rapport:  Unable to Assess Affect (typically observed):  Unable to Assess Orientation:  Oriented to Self, Oriented to Place, Oriented to  Time Alcohol / Substance use:  Not Applicable Psych involvement (Current and /or in the community):  No (Comment)  Discharge Needs  Concerns to be addressed:  Discharge Planning Concerns, Care Coordination Readmission within the last 30 days:  No Current discharge risk:  Physical Impairment, Dependent with Mobility, Cognitively Impaired Barriers to Discharge:  Continued Medical Work up   Estanislado Emms, LCSW 09/01/2017, 11:08 AM

## 2017-09-01 NOTE — [place] FL2 (Signed)
[place] MEDICAID FL2 LEVEL OF CARE SCREENING TOOL     IDENTIFICATION  Patient Name: [name] Birthdate: [birth year] Sex: male Admission Date (Current Location): 08/26/2017  [facility] and [place] Number:  Herbalist and Address:  [facility] [place], [place] [address] [place], [place] [address]      Provider Number: [id number]  Attending Physician Name and Address:  Patrecia Pour, MD  Relative Name and Phone Number:  Kasper Mudrick, spouse, [phone]    Current Level of Care: Hospital Recommended Level of Care: [place] Prior Approval Number:    Date Approved/Denied:   PASRR Number:  [id number]  Discharge Plan: SNF    Current Diagnoses: Patient Active Problem List   Diagnosis Date Noted  . Pressure injury of skin 08/28/2017  . NSTEMI (non-ST elevated myocardial infarction) ([place]) 08/27/2017  . Acute CHF (congestive heart failure) ([place]) 08/27/2017  . Rectal bleeding 07/07/2017  . Anemia   . Generalized weakness 04/25/2017  . General weakness 04/24/2017  . Cerebrovascular accident (CVA) due to embolism of right anterior cerebral artery ([place]) 09/19/2015  . History of CVA with residual deficit 09/19/2015  . HLD (hyperlipidemia) 07/12/2015  . Essential hypertension 07/12/2015  . Coronary artery disease involving coronary bypass graft of native heart with angina pectoris ([place]) 07/12/2015  . Intracranial carotid stenosis 07/12/2015  . Morbid obesity ([place]) 02/21/2015  . Dysarthria   . History of pulmonary embolism   . Acute right ACA stroke ([place]) 02/11/2015  . Lower extremity weakness 01/08/2015  . Status post placement of implantable loop recorder 08/01/2014  . Chronic respiratory failure, unspecified whether with hypoxia or hypercapnia ([place]) 05/19/2014  . Pulmonary embolism ([place]) 05/19/2014  . Hemiparesis and speech and language deficit as late effects of stroke ([place]) 04/19/2014  . Ureteral colic 02/72/5366  . Left  ureteral stone 04/15/2014  . Obstructive sleep apnea 12/07/2013  . Obesity, unspecified 12/07/2013  . Embolic stroke ([place]) 44/10/4740  . Cough 06/09/2013  . S/P lumbar spine operation 06/02/2013  . Nerve root pain 06/02/2013  . Pleural plaque consistent with asbestos exposure by CT 10/2012 02/17/2013  . Renal failure 02/13/2013  . Dyspnea 02/11/2013  . Dysphagia, pharyngoesophageal phase 05/12/2012  . Seizure disorder ([place]) 04/04/2012  . CAD (coronary artery disease) 06/13/2011  . Diabetes mellitus ([place]) 06/13/2011  . History of seizure disorder   . Depression   . Back pain   . Hearing loss   . Arrhythmia   . Diverticulitis   . H/O alcohol abuse   . Personal history of other diseases of circulatory system 05/29/2011    Orientation RESPIRATION BLADDER Height & Weight     Self, Time, Place  O2(nasal cannula 2L) Incontinent, External catheter Weight: 200 lb (90.7 kg) Height:  5' 6"  (167.6 cm)  BEHAVIORAL SYMPTOMS/MOOD NEUROLOGICAL BOWEL NUTRITION STATUS      Continent Diet  AMBULATORY STATUS COMMUNICATION OF NEEDS Skin   Extensive Assist Verbally PU Stage and Appropriate Care(PU stage I heel, foam dressing)                       Personal Care Assistance Level of Assistance  Bathing, Feeding, Dressing Bathing Assistance: Maximum assistance Feeding assistance: Independent Dressing Assistance: Maximum assistance     Functional Limitations Info  Sight, Hearing, Speech Sight Info: Adequate Hearing Info: Adequate Speech Info: Adequate    SPECIAL CARE FACTORS FREQUENCY  PT (By licensed PT)     PT Frequency: 5x/week  Contractures Contractures Info: Not present    Additional Factors Info  Code Status, Allergies, Psychotropic, Insulin Sliding Scale Code Status Info: Full Allergies Info: Cephalexin, Doxycycline, Keflex Cephalexin, Lac Bov[place], Pentazocine Lactate, [place] Pentazocine, Tape, Zanaflex Tizanidine Hcl Psychotropic Info: abilify,  trazadone Insulin Sliding Scale Info: insulin every 4 hours       Current Medications (09/01/2017):  This is the current hospital active medication list Current Facility-Administered Medications  Medication Dose Route Frequency Provider Last Rate Last Dose  . acetaminophen (TYLENOL) tablet 650 mg  650 mg Oral Q4H PRN Roney Jaffe, MD      . ARIPiprazole (ABILIFY) tablet 5 mg  5 mg Oral Daily Roney Jaffe, MD   5 mg at 09/01/17 0903  . aspirin chewable tablet 324 mg  324 mg Oral Once Charlesetta Shanks, MD      . aspirin EC tablet 325 mg  325 mg Oral Daily Roney Jaffe, MD   325 mg at 09/01/17 0904  . atorvastatin (LIPITOR) tablet 80 mg  80 mg Oral QHS Roney Jaffe, MD   80 mg at 08/31/17 2250  . donepezil (ARICEPT) tablet 10 mg  10 mg Oral Daily Roney Jaffe, MD   10 mg at 09/01/17 0903  . enoxaparin (LOVENOX) injection 40 mg  40 mg Subcutaneous Q24H Dana Allan I, MD   40 mg at 08/31/17 2251  . FLUoxetine (PROZAC) capsule 40 mg  40 mg Oral Daily Roney Jaffe, MD   40 mg at 09/01/17 0903  . glipiZIDE (GLUCOTROL) tablet 2.5 mg  2.5 mg Oral BID AC Roney Jaffe, MD   2.5 mg at 09/01/17 0904  . HYDROcodone-acetaminophen (NORCO/VICODIN) 5-325 MG per tablet 1-2 tablet  1-2 tablet Oral Q4H PRN Schorr, Rhetta Mura, NP   1 tablet at 09/01/17 0904  . insulin aspart (novoLOG) injection 0-8 Units  0-8 Units Subcutaneous Q4H Roney Jaffe, MD   1 Units at 08/31/17 1208  . lamoTRIgine (LAMICTAL) tablet 100 mg  100 mg Oral BID Roney Jaffe, MD   100 mg at 09/01/17 0903  . multivitamin with minerals tablet 1 tablet  1 tablet Oral Daily Roney Jaffe, MD   1 tablet at 09/01/17 [phone]  . nitroGLYCERIN (NITROSTAT) SL tablet 0.4 mg  0.4 mg Sublingual Q5 Min x 3 PRN Roney Jaffe, MD      . ondansetron [facility]) injection 4 mg  4 mg Intravenous Q6H PRN Roney Jaffe, MD      . potassium citrate (UROCIT-K) SR tablet 10 mEq  10 mEq Oral BID Roney Jaffe, MD   10 mEq at 08/31/17 2250   . traZODone (DESYREL) tablet 100 mg  100 mg Oral QHS Roney Jaffe, MD   100 mg at 08/31/17 2250  . vancomycin (VANCOCIN) 50 mg/mL oral solution 125 mg  125 mg Oral Q6H Laurence Spates, MD   125 mg at 09/01/17 4128     Discharge Medications: Please see discharge summary for a list of discharge medications.  Relevant Imaging Results:  Relevant Lab Results:   Additional Information SSN: [id number]  Estanislado Emms, LCSW

## 2017-09-01 NOTE — Clinical Social Work Placement (Signed)
   CLINICAL SOCIAL WORK PLACEMENT  NOTE  Date:  09/01/2017  Patient Details  Name: IKECHUKWU CERNY MRN: [id number] Date of Birth: 11-02-1945  Clinical Social Work is seeking post-discharge placement for this patient at the [place] level of care (*CSW will initial, date and re-position this form in  chart as items are completed):  Yes   Patient/family provided with [facility] Work Department's list of facilities offering this level of care within the geographic area requested by the patient (or if unable, by the patient's family).  Yes   Patient/family informed of their freedom to choose among providers that offer the needed level of care, that participate in Medicare, Medicaid or managed care program needed by the patient, have an available bed and are willing to accept the patient.  Yes   Patient/family informed of [facility]'s ownership interest in [facility] and [facility], as well as of the fact that they are under no obligation to receive care at these facilities.  PASRR submitted to EDS on 09/01/17     PASRR number received on       Existing PASRR number confirmed on 09/01/17     FL2 transmitted to all facilities in geographic area requested by pt/family on 09/01/17     FL2 transmitted to all facilities within larger geographic area on       Patient informed that his/her managed care company has contracts with or will negotiate with certain facilities, including the following:  [employer]     Yes   Patient/family informed of bed offers received.  Patient chooses bed at [facility]     Physician recommends and patient chooses bed at      Patient to be transferred to [facility] on 09/01/17.  Patient to be transferred to facility by [facility]     Patient family notified on 09/01/17 of transfer.  Name of family member notified:  Kareem Cathey, spouse     PHYSICIAN       Additional Comment:     _______________________________________________ Estanislado Emms, LCSW 09/01/2017, 1:46 PM

## 2017-09-01 NOTE — Progress Notes (Signed)
Discharge summary reviewed with pt. Pt expresses no concerns at this time. No change since previous assessment. PIV removed. Report called to [facility]. Pt discharged and awaiting transportation via [place]. Oncoming RN updated.

## 2017-09-01 NOTE — Evaluation (Signed)
Physical Therapy Evaluation Patient Details Name: [name] MRN: [id number] DOB: October 24, 1945 Today's Date: 09/01/2017   History of Present Illness  [name] is a 72 y.o. male with hx of depression, past hx etoh abuse, DM, CAD sp CABG, HL, HTN, seizure d/o, OSA and CVA, memory impairment, who presented to ED c/o SOB, diarrhea and distended abdomen.  Clinical Impression  Pt admitted with/for SOB, diarrhea.  Pt generally weak and very quick to fatigue.  Pt needing min assist overall..  Pt currently limited functionally due to the problems listed. ( See problems list.)   Pt will benefit from PT to maximize function and safety in order to get ready for next venue listed below.     Follow Up Recommendations SNF    Equipment Recommendations  None recommended by PT(TBA  next venue)    Recommendations for Other Services       Precautions / Restrictions Precautions Precautions: Fall      Mobility  Bed Mobility Overal bed mobility: Needs Assistance Bed Mobility: Supine to Sit     Supine to sit: Min assist        Transfers Overall transfer level: Needs assistance   Transfers: Sit to/from Stand Sit to Stand: Min assist         General transfer comment: cues for hand placement, stability assist  Ambulation/Gait Ambulation/Gait assistance: Min assist Ambulation Distance (Feet): 20 Feet(then 10 feet and finally another 15 ) Assistive device: Rolling walker (2 wheeled) Gait Pattern/deviations: Step-through pattern Gait velocity: slow Gait velocity interpretation: Below normal speed for age/gender General Gait Details: mildly unsteady, wandering as if in a daze, droopy posture.  Stairs            Wheelchair Mobility    Modified Rankin (Stroke Patients Only)       Balance Overall balance assessment: Needs assistance Sitting-balance support: No upper extremity supported;Single extremity supported Sitting balance-Leahy Scale: Fair     Standing balance  support: Single extremity supported;Bilateral upper extremity supported Standing balance-Leahy Scale: Poor Standing balance comment: reliant presently on assist or AD                             Pertinent Vitals/Pain Pain Assessment: Faces Faces Pain Scale: No hurt    Home Living Family/patient expects to be discharged to:: Private residence Living Arrangements: Spouse/significant other Available Help at Discharge: Family;Available 24 hours/day;Personal care attendant Type of Home: House Home Access: Ramped entrance     Home Layout: Two level;Bed/bath upstairs Home Equipment: Shower seat;Grab bars - tub/shower;Hand held shower head;Walker - 4 wheels;Walker - 2 wheels;Adaptive equipment;Other (comment)(has stair lifts to access all parts of the home) Additional Comments: has an aide 3 hours a day for showers and LB dressing    Prior Function Level of Independence: Needs assistance   Gait / Transfers Assistance Needed: no device in the home, uses walker outside  ADL's / Homemaking Assistance Needed: sitter assists with shower and LB dressing        Hand Dominance   Dominant Hand: Left    Extremity/Trunk Assessment   Upper Extremity Assessment Upper Extremity Assessment: Defer to OT evaluation    Lower Extremity Assessment Lower Extremity Assessment: Generalized weakness    Cervical / Trunk Assessment Cervical / Trunk Assessment: Kyphotic  Communication   Communication: HOH  Cognition Arousal/Alertness: Lethargic;Awake/alert Behavior During Therapy: Flat affect Overall Cognitive Status: History of cognitive impairments - at baseline Area of Impairment: Memory  Memory: Decreased short-term memory                General Comments General comments (skin integrity, edema, etc.): Sats maintain at mid to upper 90's  on RA overall, pt thought he needed supplemental O2    Exercises     Assessment/Plan    PT Assessment  Patient needs continued PT services  PT Problem List Decreased strength;Decreased activity tolerance;Decreased mobility;Decreased knowledge of use of DME;Decreased safety awareness       PT Treatment Interventions Gait training;DME instruction;Functional mobility training;Therapeutic activities;Patient/family education    PT Goals (Current goals can be found in the Care Plan section)  Acute Rehab PT Goals Patient Stated Goal: get rehab to strengthen and recondition before going home  PT Goal Formulation: With patient Time For Goal Achievement: 09/08/17 Potential to Achieve Goals: Good    Frequency Min 2X/week   Barriers to discharge        Co-evaluation               AM-PAC PT "6 Clicks" Daily Activity  Outcome Measure Difficulty turning over in bed (including adjusting bedclothes, sheets and blankets)?: None Difficulty moving from lying on back to sitting on the side of the bed? : None Difficulty sitting down on and standing up from a chair with arms (e.g., wheelchair, bedside commode, etc,.)?: Unable Help needed moving to and from a bed to chair (including a wheelchair)?: A Little Help needed walking in hospital room?: A Little Help needed climbing 3-5 steps with a railing? : A Little 6 Click Score: 18    End of Session   Activity Tolerance: Patient limited by fatigue;Patient limited by lethargy Patient left: in chair;with call bell/phone within reach;with chair alarm set Nurse Communication: Mobility status PT Visit Diagnosis: Unsteadiness on feet (R26.81);Other abnormalities of gait and mobility (R26.89)    Time: 4782-9562 PT Time Calculation (min) (ACUTE ONLY): 51 min   Charges:   PT Evaluation $PT Eval Moderate Complexity: 1 Mod PT Treatments $Gait Training: 8-22 mins $Therapeutic Activity: 8-22 mins   PT G Codes:        09/21/2017  Archie Bing, PT [phone]  (pager)  Eliseo Gum Carriann Hesse September 21, 2017, 3:07 PM

## 2017-09-01 NOTE — Progress Notes (Signed)
Progress Note  Patient Name: [name] Date of Encounter: 09/01/2017  Primary Cardiologist:   No primary care provider on file.   Subjective   "I feel terrible."  He complains of back pain.  No acute SOB.  Lying flat.    Inpatient Medications    Scheduled Meds: . ARIPiprazole  5 mg Oral Daily  . aspirin  324 mg Oral Once  . aspirin  325 mg Oral Daily  . atorvastatin  80 mg Oral QHS  . donepezil  10 mg Oral Daily  . enoxaparin (LOVENOX) injection  40 mg Subcutaneous Q24H  . FLUoxetine  40 mg Oral Daily  . glipiZIDE  2.5 mg Oral BID AC  . insulin aspart  0-8 Units Subcutaneous Q4H  . lamoTRIgine  100 mg Oral BID  . multivitamin with minerals  1 tablet Oral Daily  . potassium citrate  10 mEq Oral BID  . traZODone  100 mg Oral QHS  . vancomycin  125 mg Oral Q6H   Continuous Infusions:  PRN Meds: acetaminophen, HYDROcodone-acetaminophen, nitroGLYCERIN, ondansetron (ZOFRAN) IV   Vital Signs    Vitals:   08/31/17 0506 08/31/17 1605 08/31/17 2044 09/01/17 0445  BP: (!) 115/59 125/62 (!) 111/56 (!) 97/54  Pulse: 69 75 68 63  Resp: 15 (!) 26 16 13   Temp: 98 F (36.7 C) 98.1 F (36.7 C) (!) 97.4 F (36.3 C) 98.6 F (37 C)  TempSrc: Oral Oral Oral Axillary  SpO2: 94% 99% 96% 96%  Weight: 196 lb 12.8 oz (89.3 kg)   200 lb (90.7 kg)  Height:        Intake/Output Summary (Last 24 hours) at 09/01/2017 1002 Last data filed at 09/01/2017 0449 Gross per 24 hour  Intake 240 ml  Output 900 ml  Net -660 ml   Filed Weights   08/30/17 0700 08/31/17 0506 09/01/17 0445  Weight: 180 lb 16 oz (82.1 kg) 196 lb 12.8 oz (89.3 kg) 200 lb (90.7 kg)    Telemetry    NSR - Personally Reviewed  ECG     - Personally Reviewed  Physical Exam   GEN: No acute distress.   Neck: No  JVD Cardiac: RRR, no murmurs, rubs, or gallops.  Respiratory: Clear  to auscultation bilaterally. GI: Soft, nontender, non-distended  MS: No  edema; No deformity. Neuro:  Nonfocal  Psych: Normal  affect   Labs    Chemistry Recent Labs  Lab 08/26/17 2210  08/28/17 0929 08/30/17 1209 08/31/17 0311 09/01/17 0454  NA 136   < > 138 157* 135 138  K 4.5   < > 4.0 4.1 4.2 4.4  CL 103   < > 100* 105 99* 100*  CO2 25   < > 29 33* 29 29  GLUCOSE 104*   < > 190* 149* 109* 125*  BUN 13   < > 15 14 17 17   CREATININE 0.92   < > 1.21 0.95 0.90 1.02  CALCIUM 8.5*   < > 8.4* 9.5 8.6* 8.5*  PROT 6.9  --  6.1*  --   --   --   ALBUMIN 3.2*  --  3.0*  --   --   --   AST 59*  --  40  --   --   --   ALT 84*  --  59  --   --   --   ALKPHOS 138*  --  109  --   --   --   BILITOT  0.7  --  0.7  --   --   --   GFRNONAA >60   < > 58* >60 >60 >60  GFRAA >60   < > >60 >60 >60 >60  ANIONGAP 8   < > 9 19* 7 9   < > = values in this interval not displayed.     Hematology Recent Labs  Lab 08/27/17 0503 08/28/17 0346 08/29/17 0407  WBC 12.4* 12.0* 10.0  RBC 3.54* 3.82* 3.61*  HGB 10.7* 10.2* 10.3*  HCT 32.6* 35.4* 33.5*  MCV 92.1 92.7 92.8  MCH 30.2 26.7 28.5  MCHC 32.8 28.8* 30.7  RDW 15.2 15.4 15.3  PLT 279 272 287    Cardiac Enzymes Recent Labs  Lab 08/27/17 0502 08/28/17 0929  TROPONINI 0.61* 0.38*    Recent Labs  Lab 08/26/17 2101  TROPIPOC 0.34*     BNP Recent Labs  Lab 08/26/17 2051  BNP 533.3*     DDimer  Recent Labs  Lab 08/27/17 0502  DDIMER 0.99*     Radiology    No results found.  Cardiac Studies   ECHO:  08/29/17  Study Conclusions  - Left ventricle: The cavity size was normal. Wall thickness was   normal. Systolic function was normal. The estimated ejection   fraction was in the range of 50% to 55%. There is hypokinesis of   the basal-midinferior myocardium. Doppler parameters are   consistent with abnormal left ventricular relaxation (grade 1   diastolic dysfunction). - Aortic valve: Trileaflet; moderately thickened, moderately   calcified leaflets. Valve mobility was restricted. - Aorta: The aorta was mildly calcified. - Mitral valve:  Calcified annulus. There was mild regurgitation. - Left atrium: The atrium was mildly dilated. - Right atrium: The atrium was mildly dilated.  Patient Profile     72 y.o. male with a hx of CAD s/p CABGx4 12/2003, pulmonary embolism 04/2014, stroke, HTN, HLD, seizure, depression, alcohol abuse, DM, GERD, sleep apnea (does not use CPAP), mild carotid artery disease, chronic respiratory failure on home O2 with prior possible asbestos exposure, diverticulosis s/p partial colectomy, h/o AKI (With ARB -> normal renal dopplers 2014), recent GI issues and frequent falling who is being seen for the evaluation of elevated troponin at the request of Dr. Jonnie Finner.   Assessment & Plan    ELEVATED TROPONIN:  Demand ischemia.  No further work up.  Limited medical management.  Not a DAPT candidate long term secondary to GI bleed.  Has had bradycardia on beta blockers.  No further work up or change in therapy is planned  AKD:    Creat is stable.    ACUTE ON CHRONIC DIASTOLIC HF:  Negative 2.7 liters since admission.   Weight has been all over the place.  Seems to be euvolemic.   We will sign off.  Please call with further questions.    For questions or updates, please contact [place] Please consult [email or web address] for contact info under Cardiology/STEMI.   Signed, Minus Breeding, MD  09/01/2017, 10:02 AM

## 2017-09-01 NOTE — Discharge Summary (Addendum)
Physician Discharge Summary  [name] KDT:[id number] DOB: 11-18-1945 DOA: 08/26/2017  PCP: Bernell List, MD  Admit date: 08/26/2017 Discharge date: 09/01/2017  Admitted From: Home Disposition: SNF   Recommendations for Outpatient Follow-up:  1. Follow up with GI in 3 weeks 2. Monitor SpO2. 3. Please obtain BMP/CBC in one week  Home Health: N/A Equipment/Devices: Per SNF Discharge Condition: Stable CODE STATUS: Full Diet recommendation: Heart healthy.  Brief/Interim Summary: Patient is a 72 y.o.malewith hx of depression,pasthx etoh abuse, DM, CAD sp CABG, HL, HTN, seizure d/o, OSA and CVA,memory impairment. Patient was admitted with SOB, diarrhea and distended abdomen. In ED CXR showed +early CHF. CT was done for distended abdomen and plain film showing distended SB, but on CT abd the SB was normal. Trop was high at 0.61. Cardiology was called by ED MD and they suggested IV heparin for poss nstemi and admit patient to [facility] for further assessment of elevated troponin. Cardiology does not plan any further cardiac work up. Troponin elevation is thought to be due to demand ischemia.   Patient has chronic diarrhea following near total colectomy though stool frequency was worsened, stool analysis is positive for C. Diff. Symptoms have improved on oral vancomycin. Cardiology feels the patient is euvolemic and GI has signed off with recommendations to follow up with his GI, Dr. [name] in 3-4 weeks.   Discharge Diagnoses:  Principal Problem:   NSTEMI (non-ST elevated myocardial infarction) ([place]) Active Problems:   History of seizure disorder   Depression   Diabetes mellitus ([place])   Seizure disorder (HCC)   Dyspnea   Chronic respiratory failure, unspecified whether with hypoxia or hypercapnia (HCC)   HLD (hyperlipidemia)   Essential hypertension   Coronary artery disease involving coronary bypass graft of native heart with angina pectoris ([place])   History of CVA with residual  deficit   General weakness   Acute CHF (congestive heart failure) (HCC)   Pressure injury of skin  C diff colitis: No leukocytosis, benign exam. Oral Vancomycin to continue x10 days (1/4 - 1/13)  Acute on chronic hypoxic respiratory failure: Needs O2 qHS w/BiPAP per family, though acute component has resolved. Possibly due to acute diastolic CHF, new onset for this patient.   Acute diastolic CHF: Low-normal EF.  - Recommend continuing prn lasix only.   CAD w/Hx CABG 2005: No chest pain, elevated troponin felt to be due to demand, not coronary occlusion. No further cardiac work up recommended per cardiology. 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  - Continue aricept  Depression  - Continue abilify, prozac  Seizure d/o  - Continue lamictal  Discharge Instructions Discharge Instructions    Diet - low sodium heart healthy   Complete by:  As directed      Allergies as of 09/01/2017      Reactions   Cephalexin    Unknown   Doxycycline    Unknown   Keflex [cephalexin]    Lac Bovis Other (See Comments)   lactose intolerant   Pentazocine Lactate    He passed out- he had a seizure.  This occurred around 2000   Talwin [pentazocine] Other (See Comments)   hallucinations   Tape Other (See Comments)   Paper tape only please.   Zanaflex [tizanidine Hcl] Other (See Comments)   Lightheaded and dizzy      Medication List    STOP taking these  medications   diphenoxylate-atropine 2.5-0.025 MG tablet Commonly known as:  LOMOTIL   omeprazole 40 MG capsule Commonly known as:  PRILOSEC     TAKE these medications   acetaminophen 325 MG tablet Commonly known as:  TYLENOL Take 650 mg by mouth every 6 (six) hours as needed for moderate pain.   ARIPiprazole 5 MG tablet Commonly known as:  ABILIFY Take 5 mg by mouth daily.   aspirin 325 MG EC tablet Take 325 mg daily by mouth.   atorvastatin 80 MG tablet Commonly  known as:  LIPITOR Take 1 tablet (80 mg total) by mouth daily.   camphor-menthol lotion Commonly known as:  SARNA Apply 1 application topically daily. To entire body   donepezil 10 MG disintegrating tablet Commonly known as:  ARICEPT ODT Take 10 mg by mouth daily.   Fluocinolone Acetonide 0.01 % Oil Apply 1 application topically daily as needed (itching). Apply to scalp   FLUoxetine 20 MG capsule Commonly known as:  PROZAC Take 40 mg by mouth daily.   fluticasone 50 MCG/ACT nasal spray Commonly known as:  FLONASE Place 1 spray into both nostrils 2 (two) times daily as needed for rhinitis.   furosemide 20 MG tablet Commonly known as:  LASIX TAKE 1-2 TABLETS BY MOUTH EVERY DAY AS NEEDED What changed:  See the new instructions.   glipiZIDE 5 MG tablet Commonly known as:  GLUCOTROL Take 2.5 mg by mouth 2 (two) times daily before a meal. 30 minutes prior to meal   lamoTRIgine 200 MG tablet Commonly known as:  LAMICTAL Take 100 mg by mouth 2 (two) times daily. What changed:  Another medication with the same name was removed. Continue taking this medication, and follow the directions you see here.   multivitamin capsule Take 1 capsule by mouth daily.   nitroGLYCERIN 0.4 MG SL tablet Commonly known as:  NITROSTAT Place 1 tablet (0.4 mg total) under the tongue every 5 (five) minutes as needed. For chest pain   ONE TOUCH ULTRA TEST test strip Generic drug:  glucose blood USE TO TEST BLOOD SUGAR DAILY   potassium citrate 10 MEQ (1080 MG) SR tablet Commonly known as:  UROCIT-K Take 10 mEq 2 (two) times daily by mouth.   traZODone 100 MG tablet Commonly known as:  DESYREL Take 100 mg by mouth at bedtime.   triamcinolone 0.1 % cream : eucerin Crea Apply 1 application topically daily. TRIAMCINOLONE 1%/ Absorbase   vancomycin 50 mg/mL oral solution Commonly known as:  VANCOCIN Take 2.5 mLs (125 mg total) by mouth every 6 (six) hours.       Contact information for  follow-up providers    Laurence Spates, MD. Schedule an appointment as soon as possible for a visit in 3 week(s).   Specialty:  Gastroenterology Contact information: [address] [place] [address] [phone]            Contact information for after-discharge care    Destination    [facility] Follow up.   Service:  Skilled Nursing Contact information: [place] [address] [phone]                 Allergies  Allergen Reactions  . Cephalexin     Unknown  . Doxycycline     Unknown  . Keflex [Cephalexin]   . Lac Bovis Other (See Comments)    lactose intolerant  . Pentazocine Lactate     He passed out- he had  a seizure.  This occurred around 2000  . Talwin [Pentazocine] Other (See Comments)    hallucinations  . Tape Other (See Comments)    Paper tape only please.  . Zanaflex [Tizanidine Hcl] Other (See Comments)    Lightheaded and dizzy    Consultations:  Cardiology, Dr. Wynonia Lawman  GI, Dr. Paulita Fujita  Procedures/Studies: Ct Abdomen Pelvis Wo Contrast  Result Date: 08/27/2017 CLINICAL DATA:  Acute onset of generalized abdominal distention and diarrhea. Leukocytosis. Elevated lipase. Microhematuria. EXAM: CT ABDOMEN AND PELVIS WITHOUT CONTRAST TECHNIQUE: Multidetector CT imaging of the abdomen and pelvis was performed following the standard protocol without IV contrast. COMPARISON:  CT of the abdomen and pelvis from 07/06/2017 FINDINGS: Lower chest: Trace bilateral pleural fluid is noted, with interstitial prominence, likely reflecting mild interstitial edema. Scattered coronary artery calcifications are seen. The patient is status post median sternotomy. Hepatobiliary: The liver is unremarkable in appearance. The gallbladder is unremarkable in appearance. The common bile duct remains normal in caliber. Pancreas: The pancreas is within normal limits. Spleen: The spleen is unremarkable in appearance.  Adrenals/Urinary Tract: The adrenal glands are unremarkable in appearance. The kidneys are within normal limits. There is no evidence of hydronephrosis. No renal or ureteral stones are identified. Nonspecific perinephric stranding and fluid is noted bilaterally. Stomach/Bowel: The patient is status post resection of much of the colon. The ileocolic anastomosis is unremarkable. Remaining small bowel is unremarkable in appearance. The stomach is within normal limits. Vascular/Lymphatic: Diffuse calcification is seen along the abdominal aorta and its branches. The abdominal aorta is otherwise grossly unremarkable. The inferior vena cava is grossly unremarkable. No retroperitoneal lymphadenopathy is seen. No pelvic sidewall lymphadenopathy is identified. Reproductive: The bladder is mildly distended and grossly unremarkable. The prostate remains normal in size, with minimal calcification. A penile implant is noted, with a right-sided pelvic reservoir. Other: No additional soft tissue abnormalities are seen. Musculoskeletal: No acute osseous abnormalities are identified. The patient is status post lumbosacral spinal fusion at L5-S1. The visualized musculature is unremarkable in appearance. IMPRESSION: 1. Trace bilateral pleural fluid, with interstitial prominence, likely reflecting mild interstitial edema. 2. Scattered coronary artery calcifications. 3. Ileocolic anastomosis is unremarkable in appearance. Aortic Atherosclerosis (ICD10-I70.0). Electronically Signed   By: Garald Balding M.D.   On: 08/27/2017 00:25   Dg Chest 2 View  Result Date: 08/26/2017 CLINICAL DATA:  Congestion, cough, swelling, and shortness of breath. History of diabetes. Nonsmoker. EXAM: CHEST  2 VIEW COMPARISON:  04/24/2017 FINDINGS: Postoperative changes in the mediastinum. Cardiac enlargement with pulmonary vascular congestion. Interstitial opacities in the lung bases likely represent edema. Small bilateral pleural effusions. No focal  consolidation. No pneumothorax. Calcification of the aorta. Degenerative changes in the spine. IMPRESSION: Cardiac enlargement with pulmonary vascular congestion and mild interstitial edema. Small bilateral pleural effusions. Electronically Signed   By: Lucienne Capers M.D.   On: 08/26/2017 21:10   Dg Abdomen 1 View  Result Date: 08/26/2017 CLINICAL DATA:  Acute onset of shortness of breath. Diarrhea. Abdominal distention. EXAM: ABDOMEN - 1 VIEW COMPARISON:  CT of the abdomen and pelvis from 07/06/2017 FINDINGS: There is distention of small-bowel loops 4.2 cm in diameter, raising concern for partial obstruction or dysmotility. Residual air is seen along the descending colon. The stomach is partially distended with air. No free intra-abdominal air is identified, though evaluation for free air is limited on a single supine view. The patient is status post spinal fusion at L5-S1; the sacroiliac joints are unremarkable in appearance. The visualized lung bases are  essentially clear. IMPRESSION: Distention of small-bowel loops to 4.2 cm in diameter, raising concern for partial small bowel obstruction or dysmotility. Residual air seen within the descending colon. Electronically Signed   By: Garald Balding M.D.   On: 08/26/2017 22:09   Subjective: Tired, breathing is much better. His chronic back pain (h/o multiple back surgeries) is at baseline. No chest pain. Pt and RN report significant improvement in diarrhea frequency. Still very weak, unable to get up without assist x2.   Discharge Exam: Vitals:   09/01/17 0445 09/01/17 1201  BP: (!) 97/54 117/69  Pulse: 63 77  Resp: 13 19  Temp: 98.6 F (37 C)   SpO2: 96% 93%   General: Tired but non toxic male in no distress.  Cardiovascular: RRR, II/VI systolic murmur at RUSB, no JVD, no LE edema Respiratory: CTA bilaterally, no wheezing, no rhonchi Abdominal: Soft, NT, ND, bowel sounds + Extremities: No clubbing or cyanosis  Labs: BNP (last 3  results) Recent Labs    08/26/17 2051  BNP 831.5*   Basic Metabolic Panel: Recent Labs  Lab 08/27/17 0503 08/28/17 0929 08/30/17 1209 08/31/17 0311 09/01/17 0454  NA 139 138 157* 135 138  K 3.6 4.0 4.1 4.2 4.4  CL 102 100* 105 99* 100*  CO2 27 29 33* 29 29  GLUCOSE 105* 190* 149* 109* 125*  BUN 12 15 14 17 17   CREATININE 0.91 1.21 0.95 0.90 1.02  CALCIUM 8.5* 8.4* 9.5 8.6* 8.5*  MG  --  2.0  --   --   --    Liver Function Tests: Recent Labs  Lab 08/26/17 2210 08/28/17 0929  AST 59* 40  ALT 84* 59  ALKPHOS 138* 109  BILITOT 0.7 0.7  PROT 6.9 6.1*  ALBUMIN 3.2* 3.0*   Recent Labs  Lab 08/26/17 2210  LIPASE 77*   No results for input(s): AMMONIA in the last 168 hours. CBC: Recent Labs  Lab 08/26/17 2051 08/27/17 0503 08/28/17 0346 08/29/17 0407  WBC 13.7* 12.4* 12.0* 10.0  HGB 10.9* 10.7* 10.2* 10.3*  HCT 34.5* 32.6* 35.4* 33.5*  MCV 92.0 92.1 92.7 92.8  PLT 321 279 272 287   Cardiac Enzymes: Recent Labs  Lab 08/27/17 0502 08/28/17 0929  TROPONINI 0.61* 0.38*   BNP: Invalid input(s): POCBNP CBG: Recent Labs  Lab 08/31/17 1604 08/31/17 2134 09/01/17 0547 09/01/17 0752 09/01/17 1157  GLUCAP 135* 131* 150* 121* 140*   D-Dimer No results for input(s): DDIMER in the last 72 hours. Hgb A1c No results for input(s): HGBA1C in the last 72 hours. Lipid Profile No results for input(s): CHOL, HDL, LDLCALC, TRIG, CHOLHDL, LDLDIRECT in the last 72 hours. Thyroid function studies No results for input(s): TSH, T4TOTAL, T3FREE, THYROIDAB in the last 72 hours.  Invalid input(s): FREET3 Anemia work up No results for input(s): VITAMINB12, FOLATE, FERRITIN, TIBC, IRON, RETICCTPCT in the last 72 hours. Urinalysis    Component Value Date/Time   COLORURINE YELLOW 08/26/2017 2218   APPEARANCEUR CLEAR 08/26/2017 2218   LABSPEC 1.009 08/26/2017 2218   PHURINE 5.0 08/26/2017 2218   GLUCOSEU NEGATIVE 08/26/2017 2218   HGBUR SMALL (A) 08/26/2017 2218    BILIRUBINUR NEGATIVE 08/26/2017 2218   BILIRUBINUR neg 04/27/2014 [place] 08/26/2017 2218   PROTEINUR NEGATIVE 08/26/2017 2218   UROBILINOGEN 0.2 03/07/2015 1005   NITRITE NEGATIVE 08/26/2017 2218   LEUKOCYTESUR NEGATIVE 08/26/2017 2218    Microbiology Recent Results (from the past 240 hour(s))  Culture, blood (routine x 2)  Status: None   Collection Time: 08/26/17 10:20 PM  Result Value Ref Range Status   Specimen Description BLOOD LEFT WRIST  Final   Special Requests   Final    BOTTLES DRAWN AEROBIC AND ANAEROBIC Blood Culture adequate volume   Culture   Final    NO GROWTH 5 DAYS Performed at [facility], [address] [place], [place] [address]    Report Status 09/01/2017 FINAL  Final  Culture, blood (routine x 2)     Status: None   Collection Time: 08/27/17  1:30 AM  Result Value Ref Range Status   Specimen Description BLOOD LEFT ARM  Final   Special Requests   Final    BOTTLES DRAWN AEROBIC AND ANAEROBIC Blood Culture adequate volume   Culture   Final    NO GROWTH 5 DAYS Performed at [facility], [place] [address] [place], [place] [address]    Report Status 09/01/2017 FINAL  Final  Giardia/Cryptosporidium EIA     Status: None (Preliminary result)   Collection Time: 08/27/17 12:35 PM  Result Value Ref Range Status   Giardia Ag, Stl PENDING  Incomplete   Cryptosporidium EIA Negative Negative Final    Comment: (NOTE) Performed At: [facility] [place], [place] [id number] Rush Farmer MD GG:[id number]    Source of Sample STOOL  Final  Gastrointestinal Panel by PCR , Stool     Status: None   Collection Time: 08/27/17 12:35 PM  Result Value Ref Range Status   Campylobacter species NOT DETECTED NOT DETECTED Final   Plesimonas shigelloides NOT DETECTED NOT DETECTED Final   Salmonella species NOT DETECTED NOT DETECTED Final   Yersinia enterocolitica NOT DETECTED NOT DETECTED Final   Vibrio species NOT DETECTED  NOT DETECTED Final   Vibrio cholerae NOT DETECTED NOT DETECTED Final   Enteroaggregative E coli (EAEC) NOT DETECTED NOT DETECTED Final   Enteropathogenic E coli (EPEC) NOT DETECTED NOT DETECTED Final   Enterotoxigenic E coli (ETEC) NOT DETECTED NOT DETECTED Final   Shiga like toxin producing E coli (STEC) NOT DETECTED NOT DETECTED Final   Shigella/Enteroinvasive E coli (EIEC) NOT DETECTED NOT DETECTED Final   Cryptosporidium NOT DETECTED NOT DETECTED Final   Cyclospora cayetanensis NOT DETECTED NOT DETECTED Final   Entamoeba histolytica NOT DETECTED NOT DETECTED Final   Giardia lamblia NOT DETECTED NOT DETECTED Final   Adenovirus F40/41 NOT DETECTED NOT DETECTED Final   Astrovirus NOT DETECTED NOT DETECTED Final   Norovirus GI/GII NOT DETECTED NOT DETECTED Final   Rotavirus A NOT DETECTED NOT DETECTED Final   Sapovirus (I, II, IV, and V) NOT DETECTED NOT DETECTED Final    Comment: Performed at [facility], [address] [place], [place] [address]  C difficile quick scan w PCR reflex     Status: Abnormal   Collection Time: 08/27/17 12:35 PM  Result Value Ref Range Status   C Diff antigen POSITIVE (A) NEGATIVE Final   C Diff toxin NEGATIVE NEGATIVE Final   C Diff interpretation Results are indeterminate. See PCR results.  Final  C. Diff by PCR, Reflexed     Status: Abnormal   Collection Time: 08/27/17 12:35 PM  Result Value Ref Range Status   Toxigenic C. Difficile by PCR POSITIVE (A) NEGATIVE Final    Comment: Positive for toxigenic C. difficile with little to no toxin production. Only treat if clinical presentation suggests symptomatic illness. CRITICAL RESULT CALLED TO, READ BACK BY AND VERIFIED WITH: RN  Awilda Metro [phone] MLM     Time coordinating discharge: Approximately 40 minutes  Vance Gather, MD  [facility] 09/01/2017, 4:39 PM Pager [phone]

## 2017-09-01 NOTE — Progress Notes (Signed)
Patient will discharge to [facility] Anticipated discharge date: 09/01/17 Family notified: Lynne Righi, spouse Transportation by: Corey Harold  Nurse to call report to [phone].   CSW signing off.  Estanislado Emms, [place]  Clinical Social Worker

## 2017-09-02 LAB — GIARDIA/CRYPTOSPORIDIUM EIA
Cryptosporidium EIA: NEGATIVE
Giardia Ag, Stl: NEGATIVE

## 2017-09-03 LAB — FECAL FAT, QUALITATIVE
FAT QUAL NEUTRAL STL: NORMAL
Fat Qual Total, Stl: NORMAL

## 2017-09-05 DIAGNOSIS — R69 Illness, unspecified: Secondary | ICD-10-CM

## 2017-09-05 DIAGNOSIS — K529 Noninfective gastroenteritis and colitis, unspecified: Secondary | ICD-10-CM

## 2017-09-15 ENCOUNTER — Ambulatory Visit: Payer: Medicare Other | Admitting: Neurology

## 2017-09-26 DIAGNOSIS — F0390 Unspecified dementia without behavioral disturbance: Secondary | ICD-10-CM | POA: Diagnosis not present

## 2017-09-26 DIAGNOSIS — D649 Anemia, unspecified: Secondary | ICD-10-CM | POA: Diagnosis not present

## 2017-09-26 DIAGNOSIS — K529 Noninfective gastroenteritis and colitis, unspecified: Secondary | ICD-10-CM | POA: Diagnosis not present

## 2017-09-26 DIAGNOSIS — J449 Chronic obstructive pulmonary disease, unspecified: Secondary | ICD-10-CM | POA: Diagnosis not present

## 2017-09-26 DIAGNOSIS — E1159 Type 2 diabetes mellitus with other circulatory complications: Secondary | ICD-10-CM | POA: Diagnosis not present

## 2017-09-26 DIAGNOSIS — M4696 Unspecified inflammatory spondylopathy, lumbar region: Secondary | ICD-10-CM | POA: Diagnosis not present

## 2017-09-30 ENCOUNTER — Inpatient Hospital Stay (HOSPITAL_COMMUNITY)
Admission: EM | Admit: 2017-09-30 | Discharge: 2017-10-11 | DRG: 871 | Disposition: A | Discharge status: Skilled Nursing Facility | Payer: Medicare Other | Attending: Internal Medicine | Admitting: Internal Medicine

## 2017-09-30 ENCOUNTER — Emergency Department (HOSPITAL_COMMUNITY): Payer: Medicare Other

## 2017-09-30 ENCOUNTER — Other Ambulatory Visit: Payer: Self-pay

## 2017-09-30 ENCOUNTER — Encounter (HOSPITAL_COMMUNITY): Payer: Self-pay | Admitting: Cardiology

## 2017-09-30 DIAGNOSIS — E118 Type 2 diabetes mellitus with unspecified complications: Secondary | ICD-10-CM | POA: Diagnosis not present

## 2017-09-30 DIAGNOSIS — R0789 Other chest pain: Secondary | ICD-10-CM | POA: Diagnosis not present

## 2017-09-30 DIAGNOSIS — I6521 Occlusion and stenosis of right carotid artery: Secondary | ICD-10-CM | POA: Diagnosis present

## 2017-09-30 DIAGNOSIS — R5381 Other malaise: Secondary | ICD-10-CM | POA: Diagnosis not present

## 2017-09-30 DIAGNOSIS — R6510 Systemic inflammatory response syndrome (SIRS) of non-infectious origin without acute organ dysfunction: Secondary | ICD-10-CM | POA: Diagnosis not present

## 2017-09-30 DIAGNOSIS — R748 Abnormal levels of other serum enzymes: Secondary | ICD-10-CM

## 2017-09-30 DIAGNOSIS — K5900 Constipation, unspecified: Secondary | ICD-10-CM | POA: Diagnosis not present

## 2017-09-30 DIAGNOSIS — R7989 Other specified abnormal findings of blood chemistry: Secondary | ICD-10-CM

## 2017-09-30 DIAGNOSIS — R509 Fever, unspecified: Secondary | ICD-10-CM

## 2017-09-30 DIAGNOSIS — E739 Lactose intolerance, unspecified: Secondary | ICD-10-CM | POA: Diagnosis present

## 2017-09-30 DIAGNOSIS — Z9981 Dependence on supplemental oxygen: Secondary | ICD-10-CM

## 2017-09-30 DIAGNOSIS — I5023 Acute on chronic systolic (congestive) heart failure: Secondary | ICD-10-CM | POA: Diagnosis not present

## 2017-09-30 DIAGNOSIS — E872 Acidosis: Secondary | ICD-10-CM | POA: Diagnosis present

## 2017-09-30 DIAGNOSIS — I2510 Atherosclerotic heart disease of native coronary artery without angina pectoris: Secondary | ICD-10-CM | POA: Diagnosis present

## 2017-09-30 DIAGNOSIS — G4733 Obstructive sleep apnea (adult) (pediatric): Secondary | ICD-10-CM | POA: Diagnosis present

## 2017-09-30 DIAGNOSIS — R296 Repeated falls: Secondary | ICD-10-CM | POA: Diagnosis present

## 2017-09-30 DIAGNOSIS — E1165 Type 2 diabetes mellitus with hyperglycemia: Secondary | ICD-10-CM | POA: Diagnosis present

## 2017-09-30 DIAGNOSIS — E7849 Other hyperlipidemia: Secondary | ICD-10-CM | POA: Diagnosis not present

## 2017-09-30 DIAGNOSIS — R0602 Shortness of breath: Secondary | ICD-10-CM | POA: Diagnosis not present

## 2017-09-30 DIAGNOSIS — T17890D Other foreign object in other parts of respiratory tract causing asphyxiation, subsequent encounter: Secondary | ICD-10-CM | POA: Diagnosis not present

## 2017-09-30 DIAGNOSIS — Z7982 Long term (current) use of aspirin: Secondary | ICD-10-CM

## 2017-09-30 DIAGNOSIS — D72829 Elevated white blood cell count, unspecified: Secondary | ICD-10-CM

## 2017-09-30 DIAGNOSIS — T17908D Unspecified foreign body in respiratory tract, part unspecified causing other injury, subsequent encounter: Secondary | ICD-10-CM | POA: Diagnosis not present

## 2017-09-30 DIAGNOSIS — J80 Acute respiratory distress syndrome: Secondary | ICD-10-CM | POA: Diagnosis not present

## 2017-09-30 DIAGNOSIS — R778 Other specified abnormalities of plasma proteins: Secondary | ICD-10-CM

## 2017-09-30 DIAGNOSIS — J690 Pneumonitis due to inhalation of food and vomit: Secondary | ICD-10-CM | POA: Diagnosis present

## 2017-09-30 DIAGNOSIS — R0989 Other specified symptoms and signs involving the circulatory and respiratory systems: Secondary | ICD-10-CM | POA: Diagnosis not present

## 2017-09-30 DIAGNOSIS — I4891 Unspecified atrial fibrillation: Secondary | ICD-10-CM | POA: Diagnosis present

## 2017-09-30 DIAGNOSIS — Z951 Presence of aortocoronary bypass graft: Secondary | ICD-10-CM

## 2017-09-30 DIAGNOSIS — K224 Dyskinesia of esophagus: Secondary | ICD-10-CM | POA: Diagnosis not present

## 2017-09-30 DIAGNOSIS — I21A1 Myocardial infarction type 2: Secondary | ICD-10-CM | POA: Diagnosis present

## 2017-09-30 DIAGNOSIS — G40909 Epilepsy, unspecified, not intractable, without status epilepticus: Secondary | ICD-10-CM | POA: Diagnosis present

## 2017-09-30 DIAGNOSIS — H9190 Unspecified hearing loss, unspecified ear: Secondary | ICD-10-CM | POA: Diagnosis not present

## 2017-09-30 DIAGNOSIS — E119 Type 2 diabetes mellitus without complications: Secondary | ICD-10-CM

## 2017-09-30 DIAGNOSIS — K219 Gastro-esophageal reflux disease without esophagitis: Secondary | ICD-10-CM | POA: Diagnosis present

## 2017-09-30 DIAGNOSIS — I361 Nonrheumatic tricuspid (valve) insufficiency: Secondary | ICD-10-CM | POA: Diagnosis not present

## 2017-09-30 DIAGNOSIS — I340 Nonrheumatic mitral (valve) insufficiency: Secondary | ICD-10-CM | POA: Diagnosis not present

## 2017-09-30 DIAGNOSIS — Z91048 Other nonmedicinal substance allergy status: Secondary | ICD-10-CM

## 2017-09-30 DIAGNOSIS — R531 Weakness: Secondary | ICD-10-CM

## 2017-09-30 DIAGNOSIS — Z7984 Long term (current) use of oral hypoglycemic drugs: Secondary | ICD-10-CM

## 2017-09-30 DIAGNOSIS — I272 Pulmonary hypertension, unspecified: Secondary | ICD-10-CM | POA: Diagnosis present

## 2017-09-30 DIAGNOSIS — Z7901 Long term (current) use of anticoagulants: Secondary | ICD-10-CM

## 2017-09-30 DIAGNOSIS — I252 Old myocardial infarction: Secondary | ICD-10-CM

## 2017-09-30 DIAGNOSIS — R05 Cough: Secondary | ICD-10-CM | POA: Diagnosis not present

## 2017-09-30 DIAGNOSIS — I69354 Hemiplegia and hemiparesis following cerebral infarction affecting left non-dominant side: Secondary | ICD-10-CM | POA: Diagnosis not present

## 2017-09-30 DIAGNOSIS — I25810 Atherosclerosis of coronary artery bypass graft(s) without angina pectoris: Secondary | ICD-10-CM | POA: Diagnosis not present

## 2017-09-30 DIAGNOSIS — T17320A Food in larynx causing asphyxiation, initial encounter: Secondary | ICD-10-CM | POA: Diagnosis not present

## 2017-09-30 DIAGNOSIS — T17920A Food in respiratory tract, part unspecified causing asphyxiation, initial encounter: Secondary | ICD-10-CM

## 2017-09-30 DIAGNOSIS — I5021 Acute systolic (congestive) heart failure: Secondary | ICD-10-CM | POA: Diagnosis not present

## 2017-09-30 DIAGNOSIS — F0280 Dementia in other diseases classified elsewhere without behavioral disturbance: Secondary | ICD-10-CM | POA: Diagnosis not present

## 2017-09-30 DIAGNOSIS — K56609 Unspecified intestinal obstruction, unspecified as to partial versus complete obstruction: Secondary | ICD-10-CM

## 2017-09-30 DIAGNOSIS — R4182 Altered mental status, unspecified: Secondary | ICD-10-CM | POA: Diagnosis not present

## 2017-09-30 DIAGNOSIS — E1159 Type 2 diabetes mellitus with other circulatory complications: Secondary | ICD-10-CM | POA: Diagnosis not present

## 2017-09-30 DIAGNOSIS — I519 Heart disease, unspecified: Secondary | ICD-10-CM | POA: Diagnosis not present

## 2017-09-30 DIAGNOSIS — T17928A Food in respiratory tract, part unspecified causing other injury, initial encounter: Secondary | ICD-10-CM

## 2017-09-30 DIAGNOSIS — R1310 Dysphagia, unspecified: Secondary | ICD-10-CM | POA: Diagnosis present

## 2017-09-30 DIAGNOSIS — I25118 Atherosclerotic heart disease of native coronary artery with other forms of angina pectoris: Secondary | ICD-10-CM | POA: Diagnosis not present

## 2017-09-30 DIAGNOSIS — F0390 Unspecified dementia without behavioral disturbance: Secondary | ICD-10-CM | POA: Diagnosis present

## 2017-09-30 DIAGNOSIS — T17908A Unspecified foreign body in respiratory tract, part unspecified causing other injury, initial encounter: Secondary | ICD-10-CM

## 2017-09-30 DIAGNOSIS — E785 Hyperlipidemia, unspecified: Secondary | ICD-10-CM | POA: Diagnosis present

## 2017-09-30 DIAGNOSIS — Z77090 Contact with and (suspected) exposure to asbestos: Secondary | ICD-10-CM | POA: Diagnosis present

## 2017-09-30 DIAGNOSIS — Z888 Allergy status to other drugs, medicaments and biological substances status: Secondary | ICD-10-CM

## 2017-09-30 DIAGNOSIS — Z981 Arthrodesis status: Secondary | ICD-10-CM

## 2017-09-30 DIAGNOSIS — Z9181 History of falling: Secondary | ICD-10-CM

## 2017-09-30 DIAGNOSIS — R1312 Dysphagia, oropharyngeal phase: Secondary | ICD-10-CM | POA: Diagnosis not present

## 2017-09-30 DIAGNOSIS — F329 Major depressive disorder, single episode, unspecified: Secondary | ICD-10-CM | POA: Diagnosis present

## 2017-09-30 DIAGNOSIS — R29898 Other symptoms and signs involving the musculoskeletal system: Secondary | ICD-10-CM | POA: Diagnosis not present

## 2017-09-30 DIAGNOSIS — I110 Hypertensive heart disease with heart failure: Secondary | ICD-10-CM | POA: Diagnosis present

## 2017-09-30 DIAGNOSIS — A419 Sepsis, unspecified organism: Principal | ICD-10-CM | POA: Diagnosis present

## 2017-09-30 DIAGNOSIS — F0150 Vascular dementia without behavioral disturbance: Secondary | ICD-10-CM | POA: Diagnosis not present

## 2017-09-30 DIAGNOSIS — Z86711 Personal history of pulmonary embolism: Secondary | ICD-10-CM

## 2017-09-30 DIAGNOSIS — I214 Non-ST elevation (NSTEMI) myocardial infarction: Secondary | ICD-10-CM | POA: Diagnosis not present

## 2017-09-30 DIAGNOSIS — Z881 Allergy status to other antibiotic agents status: Secondary | ICD-10-CM

## 2017-09-30 DIAGNOSIS — I10 Essential (primary) hypertension: Secondary | ICD-10-CM | POA: Diagnosis not present

## 2017-09-30 DIAGNOSIS — R404 Transient alteration of awareness: Secondary | ICD-10-CM | POA: Diagnosis not present

## 2017-09-30 DIAGNOSIS — J449 Chronic obstructive pulmonary disease, unspecified: Secondary | ICD-10-CM | POA: Diagnosis present

## 2017-09-30 DIAGNOSIS — Z79899 Other long term (current) drug therapy: Secondary | ICD-10-CM

## 2017-09-30 DIAGNOSIS — R933 Abnormal findings on diagnostic imaging of other parts of digestive tract: Secondary | ICD-10-CM | POA: Diagnosis not present

## 2017-09-30 DIAGNOSIS — R740 Nonspecific elevation of levels of transaminase and lactic acid dehydrogenase [LDH]: Secondary | ICD-10-CM | POA: Diagnosis not present

## 2017-09-30 LAB — COMPREHENSIVE METABOLIC PANEL
ALK PHOS: 82 U/L (ref 38–126)
ALT: 81 U/L — AB (ref 17–63)
AST: 143 U/L — AB (ref 15–41)
Albumin: 3.4 g/dL — ABNORMAL LOW (ref 3.5–5.0)
Anion gap: 14 (ref 5–15)
BILIRUBIN TOTAL: 0.5 mg/dL (ref 0.3–1.2)
BUN: 38 mg/dL — AB (ref 6–20)
CALCIUM: 9.2 mg/dL (ref 8.9–10.3)
CO2: 23 mmol/L (ref 22–32)
CREATININE: 1.32 mg/dL — AB (ref 0.61–1.24)
Chloride: 103 mmol/L (ref 101–111)
GFR calc Af Amer: >60 mL/min (ref >60)
GFR, EST NON AFRICAN AMERICAN: 52 mL/min — AB (ref >60)
Glucose, Bld: 171 mg/dL — ABNORMAL HIGH (ref 65–99)
Potassium: 4.8 mmol/L (ref 3.5–5.1)
Sodium: 140 mmol/L (ref 135–145)
TOTAL PROTEIN: 6.9 g/dL (ref 6.5–8.1)

## 2017-09-30 LAB — RESPIRATORY PANEL BY PCR
Adenovirus: NOT DETECTED
Bordetella pertussis: NOT DETECTED
CORONAVIRUS 229E-RVPPCR: NOT DETECTED
Chlamydophila pneumoniae: NOT DETECTED
Coronavirus HKU1: NOT DETECTED
Coronavirus NL63: NOT DETECTED
Coronavirus OC43: NOT DETECTED
INFLUENZA B-RVPPCR: NOT DETECTED
Influenza A: NOT DETECTED
METAPNEUMOVIRUS-RVPPCR: NOT DETECTED
MYCOPLASMA PNEUMONIAE-RVPPCR: NOT DETECTED
PARAINFLUENZA VIRUS 2-RVPPCR: NOT DETECTED
Parainfluenza Virus 1: NOT DETECTED
Parainfluenza Virus 3: NOT DETECTED
Parainfluenza Virus 4: NOT DETECTED
RESPIRATORY SYNCYTIAL VIRUS-RVPPCR: NOT DETECTED
Rhinovirus / Enterovirus: NOT DETECTED

## 2017-09-30 LAB — CBC WITH DIFFERENTIAL/PLATELET
BASOS PCT: 0 %
Basophils Absolute: 0 10*3/uL (ref 0.0–0.1)
EOS ABS: 0 10*3/uL (ref 0.0–0.7)
EOS PCT: 0 %
HCT: 34.5 % — ABNORMAL LOW (ref 39.0–52.0)
HEMOGLOBIN: 10.9 g/dL — AB (ref 13.0–17.0)
Lymphocytes Relative: 5 %
Lymphs Abs: 0.8 10*3/uL (ref 0.7–4.0)
MCH: 29.1 pg (ref 26.0–34.0)
MCHC: 31.6 g/dL (ref 30.0–36.0)
MCV: 92.2 fL (ref 78.0–100.0)
Monocytes Absolute: 1.9 10*3/uL — ABNORMAL HIGH (ref 0.1–1.0)
Monocytes Relative: 12 %
NEUTROS PCT: 83 %
Neutro Abs: 12.8 10*3/uL — ABNORMAL HIGH (ref 1.7–7.7)
PLATELETS: 229 10*3/uL (ref 150–400)
RBC: 3.74 MIL/uL — AB (ref 4.22–5.81)
RDW: 15.9 % — ABNORMAL HIGH (ref 11.5–15.5)
WBC: 15.5 10*3/uL — AB (ref 4.0–10.5)

## 2017-09-30 LAB — I-STAT CG4 LACTIC ACID, ED
Lactic Acid, Venous: 3.51 mmol/L (ref 0.5–1.9)
Lactic Acid, Venous: 2.09 mmol/L (ref 0.5–1.9)

## 2017-09-30 LAB — URINALYSIS, ROUTINE W REFLEX MICROSCOPIC
Bilirubin Urine: NEGATIVE
Glucose, UA: NEGATIVE mg/dL
KETONES UR: NEGATIVE mg/dL
LEUKOCYTES UA: NEGATIVE
Nitrite: NEGATIVE
PH: 5 (ref 5.0–8.0)
PROTEIN: 30 mg/dL — AB
SQUAMOUS EPITHELIAL / LPF: NONE SEEN
Specific Gravity, Urine: >1.046 — ABNORMAL HIGH (ref 1.005–1.030)

## 2017-09-30 LAB — HEMOGLOBIN A1C
HEMOGLOBIN A1C: 7.1 % — AB (ref 4.8–5.6)
MEAN PLASMA GLUCOSE: 157.07 mg/dL

## 2017-09-30 LAB — TROPONIN I
TROPONIN I: 12.87 ng/mL — AB (ref <0.03)
Troponin I: 14.62 ng/mL (ref <0.03)
Troponin I: 13.44 ng/mL (ref <0.03)

## 2017-09-30 LAB — GLUCOSE, CAPILLARY: Glucose-Capillary: 161 mg/dL — ABNORMAL HIGH (ref 65–99)

## 2017-09-30 LAB — INFLUENZA PANEL BY PCR (TYPE A & B)
INFLAPCR: NEGATIVE
Influenza B By PCR: NEGATIVE

## 2017-09-30 LAB — TSH: TSH: 1.239 u[IU]/mL (ref 0.350–4.500)

## 2017-09-30 LAB — I-STAT TROPONIN, ED: TROPONIN I, POC: 13.14 ng/mL — AB (ref 0.00–0.08)

## 2017-09-30 LAB — MAGNESIUM: Magnesium: 2.3 mg/dL (ref 1.7–2.4)

## 2017-09-30 LAB — RAPID URINE DRUG SCREEN, HOSP PERFORMED
AMPHETAMINES: NOT DETECTED
BARBITURATES: NOT DETECTED
Benzodiazepines: NOT DETECTED
Cocaine: NOT DETECTED
Opiates: NOT DETECTED
TETRAHYDROCANNABINOL: NOT DETECTED

## 2017-09-30 LAB — CBG MONITORING, ED: GLUCOSE-CAPILLARY: 151 mg/dL — AB (ref 65–99)

## 2017-09-30 LAB — LACTIC ACID, PLASMA: LACTIC ACID, VENOUS: 2.1 mmol/L — AB (ref 0.5–1.9)

## 2017-09-30 LAB — PROCALCITONIN
Procalcitonin: <0.1 ng/mL
Procalcitonin: <0.1 ng/mL

## 2017-09-30 LAB — T4, FREE: FREE T4: 0.8 ng/dL (ref 0.61–1.12)

## 2017-09-30 LAB — PROTIME-INR
INR: 1.16
PROTHROMBIN TIME: 14.7 s (ref 11.4–15.2)

## 2017-09-30 LAB — HEPARIN LEVEL (UNFRACTIONATED): HEPARIN UNFRACTIONATED: 0.19 [IU]/mL — AB (ref 0.30–0.70)

## 2017-09-30 MED ORDER — SODIUM CHLORIDE 0.9 % IV BOLUS (SEPSIS)
1000.0000 mL | Freq: Once | INTRAVENOUS | Status: AC
  Administered 2017-09-30: 1000 mL via INTRAVENOUS

## 2017-09-30 MED ORDER — DEXTROSE 5 % IV SOLN
1.0000 g | Freq: Three times a day (TID) | INTRAVENOUS | Status: DC
  Administered 2017-09-30 – 2017-10-06 (×18): 1 g via INTRAVENOUS
  Filled 2017-09-30 (×19): qty 1

## 2017-09-30 MED ORDER — IOPAMIDOL (ISOVUE-370) INJECTION 76%
INTRAVENOUS | Status: AC
  Administered 2017-09-30: 100 mL
  Filled 2017-09-30: qty 100

## 2017-09-30 MED ORDER — INSULIN ASPART 100 UNIT/ML ~~LOC~~ SOLN
0.0000 [IU] | Freq: Three times a day (TID) | SUBCUTANEOUS | Status: DC
  Administered 2017-10-01 (×3): 3 [IU] via SUBCUTANEOUS
  Administered 2017-10-02: 2 [IU] via SUBCUTANEOUS
  Administered 2017-10-02: 3 [IU] via SUBCUTANEOUS
  Administered 2017-10-03: 2 [IU] via SUBCUTANEOUS
  Administered 2017-10-08: 3 [IU] via SUBCUTANEOUS
  Administered 2017-10-09: 2 [IU] via SUBCUTANEOUS
  Administered 2017-10-10: 3 [IU] via SUBCUTANEOUS
  Administered 2017-10-11: 2 [IU] via SUBCUTANEOUS
  Administered 2017-10-11: 15 [IU] via SUBCUTANEOUS

## 2017-09-30 MED ORDER — VANCOMYCIN HCL IN DEXTROSE 750-5 MG/150ML-% IV SOLN
750.0000 mg | Freq: Two times a day (BID) | INTRAVENOUS | Status: DC
  Administered 2017-09-30 – 2017-10-05 (×10): 750 mg via INTRAVENOUS
  Filled 2017-09-30 (×12): qty 150

## 2017-09-30 MED ORDER — LEVOFLOXACIN IN D5W 750 MG/150ML IV SOLN
750.0000 mg | INTRAVENOUS | Status: DC
  Administered 2017-10-01 – 2017-10-06 (×6): 750 mg via INTRAVENOUS
  Filled 2017-09-30 (×6): qty 150

## 2017-09-30 MED ORDER — DEXTROSE 5 % IV SOLN
2.0000 g | Freq: Once | INTRAVENOUS | Status: AC
  Administered 2017-09-30: 2 g via INTRAVENOUS
  Filled 2017-09-30: qty 2

## 2017-09-30 MED ORDER — SODIUM CHLORIDE 0.9 % IV SOLN: 250.0000 mL | INTRAVENOUS | Status: DC | PRN

## 2017-09-30 MED ORDER — POTASSIUM CITRATE ER 10 MEQ (1080 MG) PO TBCR
10.0000 meq | EXTENDED_RELEASE_TABLET | Freq: Two times a day (BID) | ORAL | Status: DC
  Administered 2017-09-30 – 2017-10-11 (×16): 10 meq via ORAL
  Filled 2017-09-30 (×22): qty 1

## 2017-09-30 MED ORDER — SODIUM CHLORIDE 0.9% FLUSH: 3.0000 mL | INTRAVENOUS | Status: DC | PRN

## 2017-09-30 MED ORDER — ASPIRIN EC 325 MG PO TBEC
325.0000 mg | DELAYED_RELEASE_TABLET | Freq: Every day | ORAL | Status: DC
  Administered 2017-10-01 – 2017-10-10 (×7): 325 mg via ORAL
  Filled 2017-09-30 (×7): qty 1

## 2017-09-30 MED ORDER — ONDANSETRON HCL 4 MG/2ML IJ SOLN
4.0000 mg | Freq: Four times a day (QID) | INTRAMUSCULAR | Status: DC | PRN
  Administered 2017-10-04 – 2017-10-06 (×2): 4 mg via INTRAVENOUS
  Filled 2017-09-30 (×2): qty 2

## 2017-09-30 MED ORDER — LACTATED RINGERS IV SOLN
INTRAVENOUS | Status: DC
  Administered 2017-09-30 – 2017-10-02 (×4): via INTRAVENOUS

## 2017-09-30 MED ORDER — LAMOTRIGINE 25 MG PO TABS
100.0000 mg | ORAL_TABLET | Freq: Two times a day (BID) | ORAL | Status: DC
  Administered 2017-10-01 – 2017-10-11 (×15): 100 mg via ORAL
  Filled 2017-09-30 (×15): qty 4

## 2017-09-30 MED ORDER — LEVOFLOXACIN IN D5W 750 MG/150ML IV SOLN
750.0000 mg | Freq: Once | INTRAVENOUS | Status: AC
  Administered 2017-09-30: 750 mg via INTRAVENOUS
  Filled 2017-09-30: qty 150

## 2017-09-30 MED ORDER — DONEPEZIL HCL 5 MG PO TABS
10.0000 mg | ORAL_TABLET | Freq: Every day | ORAL | Status: DC
  Administered 2017-10-01 – 2017-10-11 (×8): 10 mg via ORAL
  Filled 2017-09-30 (×8): qty 2

## 2017-09-30 MED ORDER — TRAZODONE HCL 100 MG PO TABS
100.0000 mg | ORAL_TABLET | Freq: Every day | ORAL | Status: DC
  Administered 2017-09-30 – 2017-10-10 (×8): 100 mg via ORAL
  Filled 2017-09-30 (×8): qty 1

## 2017-09-30 MED ORDER — VANCOMYCIN HCL IN DEXTROSE 1-5 GM/200ML-% IV SOLN: 1000.0000 mg | Freq: Once | INTRAVENOUS | Status: DC

## 2017-09-30 MED ORDER — METOPROLOL TARTRATE 12.5 MG HALF TABLET
12.5000 mg | ORAL_TABLET | Freq: Two times a day (BID) | ORAL | Status: DC
  Administered 2017-09-30 – 2017-10-02 (×4): 12.5 mg via ORAL
  Filled 2017-09-30 (×4): qty 1

## 2017-09-30 MED ORDER — INSULIN ASPART 100 UNIT/ML ~~LOC~~ SOLN: 0.0000 [IU] | Freq: Every day | SUBCUTANEOUS | Status: DC

## 2017-09-30 MED ORDER — VANCOMYCIN HCL 10 G IV SOLR
2000.0000 mg | Freq: Once | INTRAVENOUS | Status: AC
  Administered 2017-09-30: 2000 mg via INTRAVENOUS
  Filled 2017-09-30 (×2): qty 2000

## 2017-09-30 MED ORDER — NITROGLYCERIN 0.4 MG SL SUBL
0.4000 mg | SUBLINGUAL_TABLET | SUBLINGUAL | Status: DC | PRN
Start: 2017-09-30 — End: 2017-10-07

## 2017-09-30 MED ORDER — ARIPIPRAZOLE 5 MG PO TABS
5.0000 mg | ORAL_TABLET | Freq: Every day | ORAL | Status: DC
  Administered 2017-10-01 – 2017-10-11 (×8): 5 mg via ORAL
  Filled 2017-09-30 (×11): qty 1

## 2017-09-30 MED ORDER — FLUTICASONE PROPIONATE 50 MCG/ACT NA SUSP
1.0000 | Freq: Two times a day (BID) | NASAL | Status: DC | PRN
  Administered 2017-09-30: 1 via NASAL
  Filled 2017-09-30: qty 16

## 2017-09-30 MED ORDER — ACETAMINOPHEN 325 MG PO TABS
650.0000 mg | ORAL_TABLET | ORAL | Status: DC | PRN
  Administered 2017-10-02: 650 mg via ORAL
  Filled 2017-09-30 (×2): qty 2

## 2017-09-30 MED ORDER — SODIUM CHLORIDE 0.9% FLUSH
3.0000 mL | Freq: Two times a day (BID) | INTRAVENOUS | Status: DC
  Administered 2017-10-01 – 2017-10-10 (×13): 3 mL via INTRAVENOUS

## 2017-09-30 MED ORDER — ATORVASTATIN CALCIUM 80 MG PO TABS
80.0000 mg | ORAL_TABLET | Freq: Every day | ORAL | Status: DC
  Administered 2017-10-01 – 2017-10-04 (×4): 80 mg via ORAL
  Filled 2017-09-30 (×4): qty 1

## 2017-09-30 MED ORDER — HEPARIN BOLUS VIA INFUSION
4000.0000 [IU] | Freq: Once | INTRAVENOUS | Status: AC
  Administered 2017-09-30: 4000 [IU] via INTRAVENOUS
  Filled 2017-09-30: qty 4000

## 2017-09-30 MED ORDER — HEPARIN (PORCINE) IN NACL 100-0.45 UNIT/ML-% IJ SOLN
1200.0000 [IU]/h | INTRAMUSCULAR | Status: DC
  Administered 2017-09-30: 1000 [IU]/h via INTRAVENOUS
  Administered 2017-10-01 – 2017-10-02 (×3): 1250 [IU]/h via INTRAVENOUS
  Administered 2017-10-03 – 2017-10-06 (×4): 1100 [IU]/h via INTRAVENOUS
  Filled 2017-09-30 (×9): qty 250

## 2017-09-30 MED ORDER — FLUOXETINE HCL 20 MG PO CAPS
40.0000 mg | ORAL_CAPSULE | Freq: Every day | ORAL | Status: DC
  Administered 2017-10-01 – 2017-10-11 (×8): 40 mg via ORAL
  Filled 2017-09-30 (×8): qty 2

## 2017-09-30 NOTE — ED Notes (Signed)
Troponin of 14.62 reported to MD

## 2017-09-30 NOTE — Progress Notes (Signed)
ANTICOAGULATION CONSULT NOTE - Initial Consult  Pharmacy Consult for heparin Indication: chest pain/ACS  Allergies  Allergen Reactions  . Cephalexin     Unknown  . Doxycycline     Unknown  . Keflex [Cephalexin]   . Lac Bovis Other (See Comments)    lactose intolerant  . Pentazocine Lactate     He passed out- he had a seizure.  This occurred around 2000  . Talwin [Pentazocine] Other (See Comments)    hallucinations  . Tape Other (See Comments)    Paper tape only please.  Freddi Starr [Tizanidine Hcl] Other (See Comments)    Lightheaded and dizzy    Patient Measurements: Height: 5' 6"  (167.6 cm) Weight: 199 lb 15.3 oz (90.7 kg) IBW/kg (Calculated) : 63.8 Heparin Dosing Weight: 83 kg  Vital Signs: Temp: 101.2 F (38.4 C) (02/05 0700) Temp Source: Rectal (02/05 0700) BP: 115/76 (02/05 0830) Pulse Rate: 88 (02/05 0830)  Labs: Recent Labs    09/30/17 0654 09/30/17 0818  HGB 10.9*  --   HCT 34.5*  --   PLT 229  --   LABPROT  --  14.7  INR  --  1.16  CREATININE 1.32*  --     Estimated Creatinine Clearance: 52.6 mL/min (A) (by C-G formula based on SCr of 1.32 mg/dL (H)).  Assessment: CC/HPI: generalized weakness  PMH: depression, HTN, HLD, CAD s/p CABG, OSA, CVA, EtOH abuse, DM, GERD, aflutter, seizuers  Anticoag: none pta - iv hep for r/o ACS  CV: trop 13.14  Heme/Onc: H&H 10.9/34.5, Plt 229  Goal of Therapy:  Heparin level 0.3-0.7 units/ml Monitor platelets by anticoagulation protocol: Yes   Plan:  Heparin bolus 4000 units x 1 Hep gtt 1000 units/hr Initial lvl 1700  Daily HL CBC F/U cards plans  Levester Fresh, PharmD, BCPS, BCCCP Clinical Pharmacist Clinical phone for 09/30/2017 from 7a-3:30p: [phone] If after 3:30p, please call main pharmacy at: [phone] 09/30/2017 9:51 AM

## 2017-09-30 NOTE — H&P (Signed)
History and Physical    [name]:[id number] DOB: 04-Sep-1945 DOA: 09/30/2017  PCP: Bernell List, MD Consultants:  [place] Patient coming from:  Home - lives with wife; NOK: Wife, [phone]  Chief Complaint:  weakness  HPI: [name] is a 72 y.o. male with medical history significant of CVA; OSA not on CPAP; seizures; HTN; HLD; dementia; h/o ETOH abuse; DM; CAD s/p CABG; and grade 1 diastolic dysfunction in 5784 presenting with weakness.  Initially, the patient provided the history and said "I don't know" what brought him in.  He started feeling bad 2-3 years ago.  Started feeling like he does today 2-3 weeks ago.  Can't eat.  No SOB.  Occasional cough, not new, productive of clear sputum.  No fever at home.  +headache x 2-3 years, confusion x 2-3 months.  Hurts left lower back.  No chest pain, palpitations.  No abdominal pain.  No N/V/D/C.   No rash.  +weakness.  Occasional dysphagia.  Based on minimal meaningful history provided by the patient, I called and spoke to his wife by telephone.  He was previously admitted here from 1/1-7 with C. Diff colitis and discharged on oral vancomycin; he has troponin elevation thought to be related to demand ischemia and also had a BIPAP requirement qhs, possibly due to diastolic heart failure.  He left and was sent to [facility] and then he was transferred to the [place] in [place] for rehab.  His wife brought him home a week ago and he was negative for C diff prior to discharge.  2 days ago he started walking slower and leaning to his left.  Last night, he was unable to stand for his shower.  He got out of the bed overnight and fell onto his buttocks.  His wife was able to get him up and back in bed but then it happened again.  Her grandson was able to pick him up and they placed him in a lift recliner.  He was "confused with his feet."  Prior CVA with left-sided weakness but now with apparent left-sided weakness.  He has been turning his feet inward but he  did not understand what she was saying.  He did not have a fever at home until just as she called EMS.  He is intermittently confused at home.  Yesterday, he was breathing in a pattern that was bothersome to the family - "he sucks air in through his mouth and swallows it and has to burp or pass gas and he did that all day long yesterday."  No cough until today.  ?productive sputum.  Urine with an odor today.  +diarrhea but this is chronic after colonic resection; this has been better than when he had C diff.     ED Course: Sepsis - started on Vanc/Levaquin/Aztreonam.  Uncertain source - flu negative.  CXR and CTA negative.  Urine unremarkable.  Cardiology consulted - no cath while floridly septic, start Heparin, they will follow.  Review of Systems: As per HPI; otherwise review of systems reviewed and negative.   Ambulatory Status:  Ambulates with a walker  Past Medical History:  Diagnosis Date  . AKI (acute kidney injury) ([place])    a. 6/14: resolved after d/c ARB;  b. RA U/S 7/14: no RA stenosis  . Arthritis    of spine  . Atrial flutter ([place])    a. 2008 - one episode in setting of sepsis/shock.  . Back pain    persistent  .  Coronary artery disease    a. s/p CABG 2005. b. low risk nuc 2016.  Marland Kitchen Depression   . Diabetes mellitus    controled by diet  . Diverticulitis    a. s/p partial colectomy remotely.  Marland Kitchen GERD (gastroesophageal reflux disease)   . H/O alcohol abuse   . Headache(784.0)   . Hearing loss   . History of hiatal hernia   . Hx of echocardiogram    Echo 7/14:  Mod LVH, EF 55-60%, Gr 1 DD, mild MR, PASP 36  . Hyperlipidemia   . Hypertension   . Pulmonary embolism ([place]) 2005  . Renal stone 04/15/2014  . Seizures ([place])   . Sleep apnea    does not use CPAP  . Stroke [facility])     Past Surgical History:  Procedure Laterality Date  . APPENDECTOMY    . BACK SURGERY    . CARDIAC CATHETERIZATION    . CERVICAL FUSION    . CORONARY ARTERY BYPASS GRAFT  2005   LIMA graft to  LAD,saphenous vein graft to diag.,circumflex, marginal,and to the RCA  . CYSTOSCOPY/RETROGRADE/URETEROSCOPY/STONE EXTRACTION WITH BASKET Left 04/16/2014   Procedure: CYSTOSCOPY/RETROGRADE/LEFT URETEROSCOPY/STONE EXTRACTION WITH BASKET AND LASER LITHOTRIPSY;  Surgeon: Malka So, MD;  Location: [facility];  Service: Urology;  Laterality: Left;  With STENT  . ESOPHAGOGASTRODUODENOSCOPY  02/10/2012   Procedure: ESOPHAGOGASTRODUODENOSCOPY (EGD);  Surgeon: Cleotis Nipper, MD;  Location: [facility] ENDOSCOPY;  Service: Endoscopy;  Laterality: N/A;  . ESOPHAGOGASTRODUODENOSCOPY  06/29/2012   Procedure: ESOPHAGOGASTRODUODENOSCOPY (EGD);  Surgeon: Winfield Cunas., MD;  Location: [facility] ENDOSCOPY;  Service: Endoscopy;  Laterality: N/A;  . FOREIGN BODY REMOVAL  02/10/2012   Procedure: FOREIGN BODY REMOVAL;  Surgeon: Cleotis Nipper, MD;  Location: [place];  Service: Endoscopy;  Laterality: N/A;  . FOREIGN BODY REMOVAL  06/29/2012   Procedure: FOREIGN BODY REMOVAL;  Surgeon: Winfield Cunas., MD;  Location: [facility] ENDOSCOPY;  Service: Endoscopy;  Laterality: N/A;  . LEFT HEART CATHETERIZATION WITH CORONARY ANGIOGRAM N/A 04/02/2012   Procedure: LEFT HEART CATHETERIZATION WITH CORONARY ANGIOGRAM;  Surgeon: Sinclair Grooms, MD;  Location: [facility] CATH LAB;  Service: Cardiovascular;  Laterality: N/A;  . LOOP RECORDER IMPLANT N/A 11/17/2013   Procedure: LOOP RECORDER IMPLANT;  Surgeon: Deboraha Sprang, MD;  Location: [facility] CATH LAB;  Service: Cardiovascular;  Laterality: N/A;  . ORCHIECTOMY    . PARTIAL COLECTOMY     for diverticuli  . SAVORY DILATION  06/29/2012   Procedure: SAVORY DILATION;  Surgeon: Winfield Cunas., MD;  Location: [facility]OS[facility];  Service: Endoscopy;  Laterality: N/A;  . TEE WITHOUT CARDIOVERSION N/A 11/17/2013   Procedure: TRANSESOPHAGEAL ECHOCARDIOGRAM (TEE);  Surgeon: Larey Dresser, MD;  Location: [facility];  Service: Cardiovascular;  Laterality: N/A;  . UMBILICAL HERNIA REPAIR      Social History     Socioeconomic History  . Marital status: Married    Spouse name: Clinical research associate  . Number of children: 2  . Years of education: college  . Highest education level: Not on file  Social Needs  . Financial resource strain: Not on file  . Food insecurity - worry: Not on file  . Food insecurity - inability: Not on file  . Transportation needs - medical: Not on file  . Transportation needs - non-medical: Not on file  Occupational History  . 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 Smoking status: Never Smoker  .  Smokeless tobacco: Never Used  Substance and Sexual Activity  . Alcohol use: No    Comment: pt states "I used to but not anymore"  . Drug use: No  . Sexual activity: Not Currently  Other Topics Concern  . Not on file  Social History Narrative   Patient lives at home with  wife      Patient drinks 2 cups of coffee a day.patient is left handed    Allergies  Allergen Reactions  . Cephalexin     Unknown  . Doxycycline     Unknown  . Keflex [Cephalexin]   . Lac Bovis Other (See Comments)    lactose intolerant  . Pentazocine Lactate     He passed out- he had a seizure.  This occurred around 2000  . Talwin [Pentazocine] Other (See Comments)    hallucinations  . Tape Other (See Comments)    Paper tape only please.  . Zanaflex [Tizanidine Hcl] Other (See Comments)    Lightheaded and dizzy    Family History  Problem Relation Age of Onset  . Emphysema Mother        was a smoker  . Asthma Mother   . Hypertension Mother   . Heart disease Father   . Prostate cancer Paternal Grandfather   . Pancreatic cancer Paternal Uncle   . Stroke Paternal Grandmother   . Heart attack Neg Hx     Prior to Admission medications   Medication Sig Start Date End Date Taking? Authorizing Provider  acetaminophen (TYLENOL) 325 MG tablet Take 650 mg by mouth every 6 (six) hours as needed for moderate pain.   Yes [provider]   ARIPiprazole (ABILIFY) 5 MG tablet Take 5 mg by mouth daily.    Yes [provider]  Artificial Tear Solution (GENTEAL TEARS OP) Apply 1 drop to eye as needed (dry eyes).   Yes [provider]  aspirin 325 MG EC tablet Take 325 mg daily by mouth.   Yes [provider]  atorvastatin (LIPITOR) 80 MG tablet Take 1 tablet (80 mg total) by mouth daily. 10/29/13  Yes Copland, Gay Filler, MD  camphor-menthol [facility]) lotion Apply 1 application topically daily. To entire body   Yes [provider]  donepezil (ARICEPT) 10 MG tablet Take 10 mg by mouth daily.    Yes [provider]  Fluocinolone Acetonide 0.01 % OIL Apply 1 application topically daily as needed (itching). Apply to scalp   Yes [provider]  FLUoxetine (PROZAC) 20 MG capsule Take 40 mg by mouth daily.   Yes [provider]  fluticasone (FLONASE) 50 MCG/ACT nasal spray Place 1 spray into both nostrils 2 (two) times daily as needed for rhinitis.    Yes [provider]  furosemide (LASIX) 20 MG tablet TAKE 1-2 TABLETS BY MOUTH EVERY DAY AS NEEDED Patient taking differently: 20 mg mg DAILY in the morning (4am) 10/24/14  Yes Martinique, Peter M, MD  glimepiride (AMARYL) 2 MG tablet Take 1 mg by mouth daily with breakfast. Patient takes one-half tablet (41m)  every day with breakfast   Yes [provider]  lamoTRIgine (LAMICTAL) 200 MG tablet Take 100 mg by mouth 2 (two) times daily.   Yes [provider]  Multiple Vitamin (MULTIVITAMIN) capsule Take 1 capsule by mouth daily.   Yes [provider]  nitroGLYCERIN (NITROSTAT) 0.4 MG SL tablet Place 1 tablet (0.4 mg total) under the tongue every 5 (five) minutes as needed. For chest pain 03/10/15  Yes Martinique, Peter M, MD  OXYGEN Inhale 2.5 L into the lungs.   Yes [provider]  potassium citrate (UROCIT-K) 10 MEQ (1080 MG) SR tablet Take 10 mEq 2 (two) times daily by mouth.  07/08/14  Yes [provider]  traZODone (DESYREL) 100 MG tablet Take 100 mg by mouth at bedtime.    Yes [provider]  Triamcinolone Acetonide (TRIAMCINOLONE 0.1 % CREAM : EUCERIN) CREA Apply 1 application topically daily. TRIAMCINOLONE 1%/ Absorbase   Yes [provider]  ONE TOUCH ULTRA TEST test strip USE TO TEST BLOOD SUGAR DAILY 07/11/15   Darlyne Russian, MD    Physical Exam: Vitals:   09/30/17 0830 09/30/17 1030 09/30/17 1100 09/30/17 1130  BP: 115/76 118/66 117/74 115/75  Pulse: 88 89 88 89  Resp: 20 18 (!) 27 (!) 21  Temp:      TempSrc:      SpO2: 96% 94% 92% 96%  Weight:      Height:         General:  Appears acute on chronically ill Eyes:  PERRL, EOMI, normal lids, iris, mild bilateral conjunctival injection ENT:  grossly normal hearing & tongue, mmm; dry lips Neck:  no LAD, masses or thyromegaly Cardiovascular:  RRR, no m/r/g. No LE edema. Well-healed prior CABG scar. Respiratory:   CTA bilaterally with no wheezes/rales/rhonchi.  Normal respiratory effort. Abdomen:  soft, NT, ND, NABS Back:   normal alignment, no CVAT Skin:  no rash or induration seen on limited exam; buttocks and back examined without apparent pressure ulcers Musculoskeletal:  grossly normal tone BUE/BLE, good ROM, no bony abnormality Lower extremity:   No LE edema.  Limited foot exam with no ulcerations.  2+ distal pulses. Psychiatric:  blunted mood and affect, speech fluent and appropriate, AOx2 (2017 but knows it is February) Neurologic:  CN 2-12 grossly intact, moves all extremities in coordinated fashion, sensation intact    Radiological Exams on Admission: Ct Head Wo Contrast  Result Date: 09/30/2017 CLINICAL DATA:  Altered mental status. Weakness. Prior right frontal infarct. EXAM: CT HEAD WITHOUT CONTRAST TECHNIQUE: Contiguous axial images were obtained from the base of the skull through the vertex without intravenous contrast. COMPARISON:  April 15, 2017 FINDINGS: Brain: Mild diffuse  atrophy is stable. There is a cavum septum pellucidum, an anatomic variant. There is no intracranial mass, hemorrhage, extra-axial fluid collection, or midline shift. There is evidence of a prior infarct in the right frontal lobe with extension into the right centrum semiovale superiorly, stable. There is evidence of a prior small infarct in the medial left occipital lobe posteriorly. There is evidence of prior infarct in the superior left caudate nucleus head. There is small vessel disease in both anterior limbs of the internal capsules. There is periventricular small vessel disease in the centra semiovale in this area on both sides. There is small vessel disease in each thalamus. No acute infarct is demonstrable on this study. Vascular: No appreciable hyperdense vessel. There is calcification in each carotid siphon region as well as in each distal vertebral artery. Skull: Bony calvarium appears intact. Sinuses/Orbits: There is mucosal thickening in several ethmoid air cells. Other paranasal sinuses are clear. Orbits appear symmetric bilaterally except for previous cataract removal on the left. Other: Mastoid air cells are clear. IMPRESSION: Atrophy with prior infarcts and areas of small vessel disease, essentially stable. Largest prior infarct involves the right frontal lobe. No acute infarct evident. No mass or hemorrhage. Foci of arterial vascular calcification  noted. Mucosal thickening noted in several ethmoid air cells. Electronically Signed   By: Lowella Grip III M.D.   On: 09/30/2017 12:06   Dg Chest Portable 1 View  Result Date: 09/30/2017 CLINICAL DATA:  Fever EXAM: PORTABLE CHEST 1 VIEW COMPARISON:  08/26/2017 FINDINGS: Prior median sternotomy. Numerous leads and wires project over the chest. Patient rotated to the right. Midline trachea. Moderate cardiomegaly. Atherosclerosis in the transverse aorta. No pleural effusion or pneumothorax. Low lung volumes with resultant pulmonary interstitial  prominence. Mild bibasilar atelectasis. No lobar consolidation. IMPRESSION: No evidence of pneumonia or explanation for fever. Cardiomegaly and low lung volumes.  No overt congestive failure. Aortic Atherosclerosis (ICD10-I70.0). Electronically Signed   By: Abigail Miyamoto M.D.   On: 09/30/2017 07:46   Ct Angio Chest/abd/pel For Dissection W And/or W/wo  Result Date: 09/30/2017 CLINICAL DATA:  Low back pain. Generalized weakness. Concern for aortic dissection EXAM: CT ANGIOGRAPHY CHEST, ABDOMEN AND PELVIS TECHNIQUE: Multidetector CT imaging through the chest, abdomen and pelvis was performed using the standard protocol during bolus administration of intravenous contrast. Multiplanar reconstructed images and MIPs were obtained and reviewed to evaluate the vascular anatomy. CONTRAST:  132m ISOVUE-370 IOPAMIDOL (ISOVUE-370) INJECTION 76% COMPARISON:  CT abdomen 08/27/2017 FINDINGS: CTA CHEST FINDINGS Cardiovascular: Non IV contrast imaging demonstrates no intramural hematoma. Intimal calcification aorta aorta and great vessels. Contrast enhanced series demonstrates no dissection of the ascending, transverse, or descending thoracic aorta. No aneurysm. No evidence acute pulmonary embolism. Post CABG anatomy. Mediastinum/Nodes: No axillary or supraclavicular adenopathy. No mediastinal adenopathy. No hilar adenopathy. Lungs/Pleura: No suspicious pulmonary nodule. Calcified pleural surface within LEFT RIGHT hemithorax Musculoskeletal: No aggressive osseous lesion. Review of the MIP images confirms the above findings. CTA ABDOMEN AND PELVIS FINDINGS VASCULAR Aorta: Normal caliber without evidence of dissection. Celiac: Widely patent.  Mild ostial calcifications. SMA: Widely patent Renals: Patent single renal arteries with ostial calcification. Stenosis of the proximal RIGHT renal artery (image 76, series 10) with post stenotic dilatation and full reconstitution of the renal artery. IMA: Patent Inflow: Normal Veins: Normal  Review of the MIP images confirms the above findings. NON-VASCULAR Hepatobiliary: No focal hepatic lesion. No biliary duct dilatation. Gallbladder is normal. Common bile duct is normal. Pancreas: Pancreas is normal. No ductal dilatation. No pancreatic inflammation. Spleen: Normal spleen Adrenals/urinary tract: Adrenal glands and kidneys are normal. The ureters and bladder normal. Stomach/Bowel: Stomach, small bowel, appendix, and cecum are normal. The colon and rectosigmoid colon are normal. Vascular/Lymphatic: Abdominal aorta is normal caliber. There is no retroperitoneal or periportal lymphadenopathy. No pelvic lymphadenopathy. Reproductive: Prostate normal. Penile reservoir within the RIGHT lower quadrant. No complicating features. Other: No free fluid. Musculoskeletal: Posterior lumbar fusion Review of the MIP images confirms the above findings. IMPRESSION: Chest Impression: 1. No evidence of thoracic aneurysm or dissection. 2. Pleural calcification in the LEFTand RIGHT lung. 3. Coronary artery calcification and aortic atherosclerotic calcification. Abdomen / Pelvis Impression: 1. No evidence of abdominal aortic aneurysm or dissection. 2. Scattered intimal calcification of the aorta. 3. No acute abdominopelvic findings. Electronically Signed   By: SSuzy BouchardM.D.   On: 09/30/2017 12:22    EKG: Independently reviewed.  NSR with rate 90; repolarization abnormalities, severe global ischemia, diffuse ST depressions that are new   Labs on Admission: I have personally reviewed the available labs and imaging studies at the time of the admission.  Pertinent labs:   UA: moderate glucose, 30 protein, SG >1.046 UDS negative Flu negative TSH/free T4 negative Troponin 13.14, 14.62 Lactate  3.51, 2.09 Mag 2.3 Glucose 171 BUN 38/Creatinine 1.32/GFR 52; prior 17/1.02 on 09/01/17 AST 143/ALT 81 WBC 15.5 Hgb 10.9  Assessment/Plan Principal Problem:   Sepsis ([place]) Active Problems:   Diabetes mellitus  (H[facility])   Obstructive sleep apnea   Essential hypertension   NSTEMI (non-ST elevated myocardial infarction) ([place])   Dementia   Sepsis, undifferentiated -Elevated WBC count, fever, tachypnea with elevated lactate to 3.51  -While awaiting blood cultures, this appears to be a preseptic condition. -Sepsis protocol initiated -Uncertain source at this time - has some developing respiratory symptoms and despite negative CTA suspicious for pulmonary source - will repeat CXR in AM -He did have prior C diff infection recently, but repeat testing was reportedly negative and patient/wife do not complain of significant diarrhea; will follow for now and order C diff if diarrhea resumes -Blood and urine cultures pending -Will admit to SDU and continue to monitor -Treat with IV Vanc/Levaquin/Aztreonam for now -Will trend lactate to ensure improvement -Will order lower respiratory tract procalcitonin level.  Antibiotics would not be indicated for PCT <0.1 and probably should not be used for < 0.25.  >0.5 indicates infection and >>0.5 indicates more serious disease.  As the procalcitonin level normalizes, it will be reasonable to consider de-escalation of antibiotic coverage.  NSTEMI -Patient denies chest pain -Troponin is markedly elevated and concerning for ischemia. -EKG with ST depressions concerning for global ischemia. -TIMI risk score is 5; which predicts a 14 days risk of death, recurrent MI, or urgent revascularization of 26.2%.  -Will plan to admit to SDU at [facility] on telemetry to further evaluate for ACS.  -Cardiology has consulted on this patient -He has been started on a heparin drip for now -cycle troponin q6h x 3 and repeat EKG in AM -ASA 325 mg PO daily -morphine given -Continue Lipitor 80 mg qhs for now -Risk factor stratification with FLP and HgbA1c -He is likely to need definitive cardiac testing with catheterization during this hospitalization  DM -Will check A1c -hold Amaryl -Cover  with moderate-scale SSI  HTN -He does not appear to be taking medication for BP -Given his probable NSTEMI in conjunction with prior h/o CABG, will start low-dose metoprolol (his BP appears able to accept this)  Dementia -Poor historian, which complicates the situation -Continue Aricept, Abilify, Lamictal, and Trazodone  OSA -Not on home CPAP  DVT prophylaxis: Heparin drip Code Status:  Full - confirmed with patient/family Family Communication: Discussed with wife by telephone  Disposition Plan:  Home once clinically improved Consults called: Cardiology  Admission status: Admit - It is my clinical opinion that admission to [facility] is reasonable and necessary because of the expectation that this patient will require hospital care that crosses at least 2 midnights to treat this condition based on the medical complexity of the problems presented.  Given the aforementioned information, the predictability of an adverse outcome is felt to be significant.    Karmen Bongo MD [facility]  If note is complete, please contact covering daytime or nighttime physician. [email or web address] Password [email or web address]  09/30/2017, 5:05 PM

## 2017-09-30 NOTE — ED Provider Notes (Signed)
[facility] EMERGENCY DEPARTMENT Provider Note   CSN: [id number] Arrival date & time: 09/30/17  4132     History   Chief Complaint Chief Complaint  Patient presents with  . Weakness    HPI [name] is a 72 y.o. male with a history of CAD status post CABG 2005, NSTEMI 08/27/17, who presents today from home by EMS for evaluation of generalized weakness.  He reportedly had weakness while attempting to get out of bed this morning.  He denies any recent trauma.  He is able to answer questions however with delay.  He denies chest pain, SOB or headache.  He reports he hurts on his left flank which started after a fall from the toilet two days ago.  Denies dysuria, hematuria, increased urgency or frequency.  He reports he has been coughing more, denies N/V/D. His cough is non productive.    HPI  Past Medical History:  Diagnosis Date  . AKI (acute kidney injury) ([place])    a. 6/14: resolved after d/c ARB;  b. RA U/S 7/14: no RA stenosis  . Arthritis    of spine  . Atrial flutter ([place])    a. 2008 - one episode in setting of sepsis/shock.  . Back pain    persistent  . Coronary artery disease    a. s/p CABG 2005. b. low risk nuc 2016.  Marland Kitchen Depression   . Diabetes mellitus    controled by diet  . Diverticulitis    a. s/p partial colectomy remotely.  Marland Kitchen GERD (gastroesophageal reflux disease)   . H/O alcohol abuse   . Headache(784.0)   . Hearing loss   . History of hiatal hernia   . Hx of echocardiogram    Echo 7/14:  Mod LVH, EF 55-60%, Gr 1 DD, mild MR, PASP 36  . Hyperlipidemia   . Hypertension   . Pulmonary embolism ([place]) 2005  . Renal stone 04/15/2014  . Seizures ([place])   . Sleep apnea    does not use CPAP  . Stroke [facility])     Patient Active Problem List   Diagnosis Date Noted  . Chronic diarrhea   . Severe comorbid illness   . Pressure injury of skin 08/28/2017  . NSTEMI (non-ST elevated myocardial infarction) ([place]) 08/27/2017  . Acute CHF (congestive  heart failure) ([place]) 08/27/2017  . Rectal bleeding 07/07/2017  . Anemia   . Generalized weakness 04/25/2017  . General weakness 04/24/2017  . Cerebrovascular accident (CVA) due to embolism of right anterior cerebral artery ([place]) 09/19/2015  . History of CVA with residual deficit 09/19/2015  . HLD (hyperlipidemia) 07/12/2015  . Essential hypertension 07/12/2015  . Coronary artery disease involving coronary bypass graft of native heart with angina pectoris ([place]) 07/12/2015  . Intracranial carotid stenosis 07/12/2015  . Morbid obesity ([place]) 02/21/2015  . Dysarthria   . History of pulmonary embolism   . Acute right ACA stroke ([place]) 02/11/2015  . Lower extremity weakness 01/08/2015  . Status post placement of implantable loop recorder 08/01/2014  . Chronic respiratory failure, unspecified whether with hypoxia or hypercapnia ([place]) 05/19/2014  . Pulmonary embolism ([place]) 05/19/2014  . Hemiparesis and speech and language deficit as late effects of stroke ([place]) 04/19/2014  . Ureteral colic 44/08/270  . Left ureteral stone 04/15/2014  . Obstructive sleep apnea 12/07/2013  . Obesity, unspecified 12/07/2013  . Embolic stroke ([place]) 53/66/4403  . Cough 06/09/2013  . S/P lumbar spine operation 06/02/2013  . Nerve root pain 06/02/2013  .  Pleural plaque consistent with asbestos exposure by CT 10/2012 02/17/2013  . Renal failure 02/13/2013  . Dyspnea 02/11/2013  . Dysphagia, pharyngoesophageal phase 05/12/2012  . Seizure disorder ([place]) 04/04/2012  . CAD (coronary artery disease) 06/13/2011  . Diabetes mellitus ([place]) 06/13/2011  . History of seizure disorder   . Depression   . Back pain   . Hearing loss   . Arrhythmia   . Diverticulitis   . H/O alcohol abuse   . Personal history of other diseases of circulatory system 05/29/2011    Past Surgical History:  Procedure Laterality Date  . APPENDECTOMY    . BACK SURGERY    . CARDIAC CATHETERIZATION    . CERVICAL FUSION    . CORONARY ARTERY  BYPASS GRAFT  2005   LIMA graft to LAD,saphenous vein graft to diag.,circumflex, marginal,and to the RCA  . CYSTOSCOPY/RETROGRADE/URETEROSCOPY/STONE EXTRACTION WITH BASKET Left 04/16/2014   Procedure: CYSTOSCOPY/RETROGRADE/LEFT URETEROSCOPY/STONE EXTRACTION WITH BASKET AND LASER LITHOTRIPSY;  Surgeon: Malka So, MD;  Location: [facility] ORS;  Service: Urology;  Laterality: Left;  With STENT  . ESOPHAGOGASTRODUODENOSCOPY  02/10/2012   Procedure: ESOPHAGOGASTRODUODENOSCOPY (EGD);  Surgeon: Cleotis Nipper, MD;  Location: [facility] ENDOSCOPY;  Service: Endoscopy;  Laterality: N/A;  . ESOPHAGOGASTRODUODENOSCOPY  06/29/2012   Procedure: ESOPHAGOGASTRODUODENOSCOPY (EGD);  Surgeon: Winfield Cunas., MD;  Location: [facility];  Service: Endoscopy;  Laterality: N/A;  . FOREIGN BODY REMOVAL  02/10/2012   Procedure: FOREIGN BODY REMOVAL;  Surgeon: Cleotis Nipper, MD;  Location: [place];  Service: Endoscopy;  Laterality: N/A;  . FOREIGN BODY REMOVAL  06/29/2012   Procedure: FOREIGN BODY REMOVAL;  Surgeon: Winfield Cunas., MD;  Location: [facility] ENDOSCOPY;  Service: Endoscopy;  Laterality: N/A;  . LEFT HEART CATHETERIZATION WITH CORONARY ANGIOGRAM N/A 04/02/2012   Procedure: LEFT HEART CATHETERIZATION WITH CORONARY ANGIOGRAM;  Surgeon: Sinclair Grooms, MD;  Location: [facility] CATH LAB;  Service: Cardiovascular;  Laterality: N/A;  . LOOP RECORDER IMPLANT N/A 11/17/2013   Procedure: LOOP RECORDER IMPLANT;  Surgeon: Deboraha Sprang, MD;  Location[facility] C[facility] LAB;  Service: Cardiovascular;  Laterality: N/A;  . ORCHIECTOMY    . PARTIAL COLECTOMY     for diverticuli  . SAVORY DILATION  06/29/2012   Procedure: SAVORY DILATION;  Surgeon: Winfield Cunas., MD;  Location: [facility];  Service: Endoscopy;  Laterality: N/A;  . TEE WITHOUT CARDIOVERSION N/A 11/17/2013   Procedure: TRANSESOPHAGEAL ECHOCARDIOGRAM (TEE);  Surgeon: Larey Dresser, MD;  Location: [facility];  Service: Cardiovascular;  Laterality: N/A;  . UMBILICAL  HERNIA REPAIR         Home Medications    Prior to Admission medications   Medication Sig Start Date End Date Taking? Authorizing Provider  acetaminophen (TYLENOL) 325 MG tablet Take 650 mg by mouth every 6 (six) hours as needed for moderate pain.   Yes [provider]  ARIPiprazole (ABILIFY) 5 MG tablet Take 5 mg by mouth daily.    Yes [provider]  Artificial Tear Solution (GENTEAL TEARS OP) Apply 1 drop to eye as needed (dry eyes).   Yes [provider]  aspirin 325 MG EC tablet Take 325 mg daily by mouth.   Yes [provider]  atorvastatin (LIPITOR) 80 MG tablet Take 1 tablet (80 mg total) by mouth daily. 10/29/13  Yes Copland, Gay Filler, MD  camphor-menthol [facility]) lotion Apply 1 application topically daily. To entire body   Yes [provider]  donepezil (ARICEPT) 10 MG tablet  Take 10 mg by mouth daily.    Yes [provider]  Fluocinolone Acetonide 0.01 % OIL Apply 1 application topically daily as needed (itching). Apply to scalp   Yes [provider]  FLUoxetine (PROZAC) 20 MG capsule Take 40 mg by mouth daily.   Yes [provider]  fluticasone (FLONASE) 50 MCG/ACT nasal spray Place 1 spray into both nostrils 2 (two) times daily as needed for rhinitis.    Yes [provider]  furosemide (LASIX) 20 MG tablet TAKE 1-2 TABLETS BY MOUTH EVERY DAY AS NEEDED Patient taking differently: 20 mg mg DAILY in the morning (4am) 10/24/14  Yes Martinique, Peter M, MD  glimepiride (AMARYL) 2 MG tablet Take 1 mg by mouth daily with breakfast. Patient takes one-half tablet (89m)  every day with breakfast   Yes [provider]  lamoTRIgine (LAMICTAL) 200 MG tablet Take 100 mg by mouth 2 (two) times daily.   Yes [provider]  Multiple Vitamin (MULTIVITAMIN) capsule Take 1 capsule by mouth daily.   Yes [provider]  nitroGLYCERIN (NITROSTAT) 0.4 MG SL tablet Place 1 tablet (0.4 mg total) under  the tongue every 5 (five) minutes as needed. For chest pain 03/10/15  Yes JMartinique Peter M, MD  OXYGEN Inhale 2.5 L into the lungs.   Yes [provider]  potassium citrate (UROCIT-K) 10 MEQ (1080 MG) SR tablet Take 10 mEq 2 (two) times daily by mouth.  07/08/14  Yes [provider]  traZODone (DESYREL) 100 MG tablet Take 100 mg by mouth at bedtime.    Yes [provider]  Triamcinolone Acetonide (TRIAMCINOLONE 0.1 % CREAM : EUCERIN) CREA Apply 1 application topically daily. TRIAMCINOLONE 1%/ Absorbase   Yes [provider]  ONE TOUCH ULTRA TEST test strip USE TO TEST BLOOD SUGAR DAILY 07/11/15   DDarlyne Russian MD    Family History Family History  Problem Relation Age of Onset  . Emphysema Mother        was a smoker  . Asthma Mother   . Hypertension Mother   . Heart disease Father   . Prostate cancer Paternal Grandfather   . Pancreatic cancer Paternal Uncle   . Stroke Paternal Grandmother   . Heart attack Neg Hx     Social History Social History   Tobacco Use  . Smoking status: Never Smoker  . Smokeless tobacco: Never Used  Substance Use Topics  . Alcohol use: No    Comment: pt states "I used to but not anymore"  . Drug use: No     Allergies   Cephalexin; Doxycycline; Keflex [cephalexin]; Lac bovis; Pentazocine lactate; Talwin [pentazocine]; Tape; and Zanaflex [tizanidine hcl]   Review of Systems Review of Systems  Constitutional: Positive for fatigue. Negative for chills and fever.  HENT: Negative for ear pain and sore throat.   Eyes: Negative for pain and visual disturbance.  Respiratory: Positive for shortness of breath. Negative for cough and chest tightness.   Cardiovascular: Negative for chest pain and palpitations.  Gastrointestinal: Negative for abdominal pain, diarrhea, nausea and vomiting.  Genitourinary: Negative for dysuria and hematuria.  Musculoskeletal: Negative for arthralgias, back pain and neck pain.  Skin: Negative  for color change, rash and wound.  Neurological: Positive for weakness (Diffuse). Negative for seizures, syncope and headaches.  All other systems reviewed and are negative.    Physical Exam Updated Vital Signs BP 115/75   Pulse 89   Temp (!) 101.2 F (38.4 C) (Rectal)  Resp (!) 21   Ht 5' 6"  (1.676 m)   Wt 90.7 kg (199 lb 15.3 oz)   SpO2 96%   BMI 32.27 kg/m   Physical Exam  Constitutional: He is oriented to person, place, and time. He appears well-developed and well-nourished.  HENT:  Head: Normocephalic and atraumatic.  Eyes: Conjunctivae are normal.  Neck: Neck supple.  Cardiovascular: Normal rate, regular rhythm and intact distal pulses.  No murmur heard. Pulmonary/Chest: Effort normal and breath sounds normal. No respiratory distress.  Abdominal: Soft. There is no tenderness.  Musculoskeletal: He exhibits no edema.  4/5 strength in bilateral upper extremities.  Is able to lift bilateral legs off the bed.    Neurological: He is alert and oriented to person, place, and time. He displays no tremor. No cranial nerve deficit. GCS eye subscore is 3. GCS verbal subscore is 5. GCS motor subscore is 6.  Mental Status:  Alert, oriented, thought content appropriate, able to give a coherent history. Occasionally requires additional prompting for questions.  Speech fluent without evidence of aphasia. Able to follow 2 step commands without difficulty.  Cranial Nerves:  II:  Pupils pinpoint bilaterally III,IV, VI: ptosis not present, extra-ocular motions intact bilaterally  V,VII: smile symmetric VIII: hearing grossly normal to voice  X: uvula elevates symmetrically  XI: bilateral shoulder shrug symmetric and strong XII: midline tongue extension without fassiculations CV: distal pulses palpable throughout    Skin: Skin is warm and dry.  Psychiatric: He has a normal mood and affect.  Nursing note and vitals reviewed.    ED Treatments / Results  Labs (all labs ordered are  listed, but only abnormal results are displayed) Labs Reviewed  URINALYSIS, ROUTINE W REFLEX MICROSCOPIC - Abnormal; Notable for the following components:      Result Value   Specific Gravity, Urine >1.046 (*)    Hgb urine dipstick MODERATE (*)    Protein, ur 30 (*)    Bacteria, UA RARE (*)    All other components within normal limits  CBC WITH DIFFERENTIAL/PLATELET - Abnormal; Notable for the following components:   WBC 15.5 (*)    RBC 3.74 (*)    Hemoglobin 10.9 (*)    HCT 34.5 (*)    RDW 15.9 (*)    Neutro Abs 12.8 (*)    Monocytes Absolute 1.9 (*)    All other components within normal limits  COMPREHENSIVE METABOLIC PANEL - Abnormal; Notable for the following components:   Glucose, Bld 171 (*)    BUN 38 (*)    Creatinine, Ser 1.32 (*)    Albumin 3.4 (*)    AST 143 (*)    ALT 81 (*)    GFR calc non Af Amer 52 (*)    All other components within normal limits  TROPONIN I - Abnormal; Notable for the following components:   Troponin I 14.62 (*)    All other components within normal limits  CBG MONITORING, ED - Abnormal; Notable for the following components:   Glucose-Capillary 151 (*)    All other components within normal limits  I-STAT TROPONIN, ED - Abnormal; Notable for the following components:   Troponin i, poc 13.14 (*)    All other components within normal limits  I-STAT CG4 LACTIC ACID, ED - Abnormal; Notable for the following components:   Lactic Acid, Venous 3.51 (*)    All other components within normal limits  I-STAT CG4 LACTIC ACID, ED - Abnormal; Notable for the following components:   Lactic Acid,  Venous 2.09 (*)    All other components within normal limits  RESPIRATORY PANEL BY PCR  URINE CULTURE  CULTURE, BLOOD (ROUTINE X 2)  CULTURE, BLOOD (ROUTINE X 2)  RAPID URINE DRUG SCREEN, HOSP PERFORMED  INFLUENZA PANEL BY PCR (TYPE A & B)  PROTIME-INR  MAGNESIUM  TSH  T4, FREE  TROPONIN I  TROPONIN I  HEPARIN LEVEL (UNFRACTIONATED)    EKG  EKG  Interpretation  Date/Time:  Tuesday September 30 2017 06:50:44 EST Ventricular Rate:  90 PR Interval:    QRS Duration: 90 QT Interval:  324 QTC Calculation: 397 R Axis:   75 Text Interpretation:  Sinus rhythm Probable left atrial enlargement Repol abnrm, severe global ischemia (LM/MVD) New ST depressions noted. No STEMI.  Confirmed by Nanda Quinton [phone]) on 09/30/2017 7:10:06 AM       Radiology Ct Head Wo Contrast  Result Date: 09/30/2017 CLINICAL DATA:  Altered mental status. Weakness. Prior right frontal infarct. EXAM: CT HEAD WITHOUT CONTRAST TECHNIQUE: Contiguous axial images were obtained from the base of the skull through the vertex without intravenous contrast. COMPARISON:  April 15, 2017 FINDINGS: Brain: Mild diffuse atrophy is stable. There is a cavum septum pellucidum, an anatomic variant. There is no intracranial mass, hemorrhage, extra-axial fluid collection, or midline shift. There is evidence of a prior infarct in the right frontal lobe with extension into the right centrum semiovale superiorly, stable. There is evidence of a prior small infarct in the medial left occipital lobe posteriorly. There is evidence of prior infarct in the superior left caudate nucleus head. There is small vessel disease in both anterior limbs of the internal capsules. There is periventricular small vessel disease in the centra semiovale in this area on both sides. There is small vessel disease in each thalamus. No acute infarct is demonstrable on this study. Vascular: No appreciable hyperdense vessel. There is calcification in each carotid siphon region as well as in each distal vertebral artery. Skull: Bony calvarium appears intact. Sinuses/Orbits: There is mucosal thickening in several ethmoid air cells. Other paranasal sinuses are clear. Orbits appear symmetric bilaterally except for previous cataract removal on the left. Other: Mastoid air cells are clear. IMPRESSION: Atrophy with prior infarcts and areas  of small vessel disease, essentially stable. Largest prior infarct involves the right frontal lobe. No acute infarct evident. No mass or hemorrhage. Foci of arterial vascular calcification noted. Mucosal thickening noted in several ethmoid air cells. Electronically Signed   By: Lowella Grip III M.D.   On: 09/30/2017 12:06   Dg Chest Portable 1 View  Result Date: 09/30/2017 CLINICAL DATA:  Fever EXAM: PORTABLE CHEST 1 VIEW COMPARISON:  08/26/2017 FINDINGS: Prior median sternotomy. Numerous leads and wires project over the chest. Patient rotated to the right. Midline trachea. Moderate cardiomegaly. Atherosclerosis in the transverse aorta. No pleural effusion or pneumothorax. Low lung volumes with resultant pulmonary interstitial prominence. Mild bibasilar atelectasis. No lobar consolidation. IMPRESSION: No evidence of pneumonia or explanation for fever. Cardiomegaly and low lung volumes.  No overt congestive failure. Aortic Atherosclerosis (ICD10-I70.0). Electronically Signed   By: Abigail Miyamoto M.D.   On: 09/30/2017 07:46   Ct Angio Chest/abd/pel For Dissection W And/or W/wo  Result Date: 09/30/2017 CLINICAL DATA:  Low back pain. Generalized weakness. Concern for aortic dissection EXAM: CT ANGIOGRAPHY CHEST, ABDOMEN AND PELVIS TECHNIQUE: Multidetector CT imaging through the chest, abdomen and pelvis was performed using the standard protocol during bolus administration of intravenous contrast. Multiplanar reconstructed images and MIPs were obtained and  reviewed to evaluate the vascular anatomy. CONTRAST:  159m ISOVUE-370 IOPAMIDOL (ISOVUE-370) INJECTION 76% COMPARISON:  CT abdomen 08/27/2017 FINDINGS: CTA CHEST FINDINGS Cardiovascular: Non IV contrast imaging demonstrates no intramural hematoma. Intimal calcification aorta aorta and great vessels. Contrast enhanced series demonstrates no dissection of the ascending, transverse, or descending thoracic aorta. No aneurysm. No evidence acute pulmonary embolism.  Post CABG anatomy. Mediastinum/Nodes: No axillary or supraclavicular adenopathy. No mediastinal adenopathy. No hilar adenopathy. Lungs/Pleura: No suspicious pulmonary nodule. Calcified pleural surface within LEFT RIGHT hemithorax Musculoskeletal: No aggressive osseous lesion. Review of the MIP images confirms the above findings. CTA ABDOMEN AND PELVIS FINDINGS VASCULAR Aorta: Normal caliber without evidence of dissection. Celiac: Widely patent.  Mild ostial calcifications. SMA: Widely patent Renals: Patent single renal arteries with ostial calcification. Stenosis of the proximal RIGHT renal artery (image 76, series 10) with post stenotic dilatation and full reconstitution of the renal artery. IMA: Patent Inflow: Normal Veins: Normal Review of the MIP images confirms the above findings. NON-VASCULAR Hepatobiliary: No focal hepatic lesion. No biliary duct dilatation. Gallbladder is normal. Common bile duct is normal. Pancreas: Pancreas is normal. No ductal dilatation. No pancreatic inflammation. Spleen: Normal spleen Adrenals/urinary tract: Adrenal glands and kidneys are normal. The ureters and bladder normal. Stomach/Bowel: Stomach, small bowel, appendix, and cecum are normal. The colon and rectosigmoid colon are normal. Vascular/Lymphatic: Abdominal aorta is normal caliber. There is no retroperitoneal or periportal lymphadenopathy. No pelvic lymphadenopathy. Reproductive: Prostate normal. Penile reservoir within the RIGHT lower quadrant. No complicating features. Other: No free fluid. Musculoskeletal: Posterior lumbar fusion Review of the MIP images confirms the above findings. IMPRESSION: Chest Impression: 1. No evidence of thoracic aneurysm or dissection. 2. Pleural calcification in the LEFTand RIGHT lung. 3. Coronary artery calcification and aortic atherosclerotic calcification. Abdomen / Pelvis Impression: 1. No evidence of abdominal aortic aneurysm or dissection. 2. Scattered intimal calcification of the aorta.  3. No acute abdominopelvic findings. Electronically Signed   By: SSuzy BouchardM.D.   On: 09/30/2017 12:22    Procedures Procedures  CRITICAL CARE Performed by: EWyn QuakerTotal critical care time: 39 minutes Critical care time was exclusive of separately billable procedures and treating other patients. Critical care was necessary to treat or prevent imminent or life-threatening deterioration. Critical care was time spent personally by me on the following activities: development of treatment plan with patient and/or surrogate as well as nursing, discussions with consultants, evaluation of patient's response to treatment, examination of patient, obtaining history from patient or surrogate, ordering and performing treatments and interventions, ordering and review of laboratory studies, ordering and review of radiographic studies, pulse oximetry and re-evaluation of patient's condition.  NSTEMI, troponin up to 14.62, febrile, sepsis requiring multiple antibiotics, dissection scan performed based on elevated troponin, urgent cardiology consult obtained, requiring admission.    Medications Ordered in ED Medications  vancomycin (VANCOCIN) 2,000 mg in sodium chloride 0.9 % 500 mL IVPB (2,000 mg Intravenous New Bag/Given 09/30/17 1315)  vancomycin (VANCOCIN) IVPB 750 mg/150 ml premix (not administered)  levofloxacin (LEVAQUIN) IVPB 750 mg (not administered)  aztreonam (AZACTAM) 1 g in dextrose 5 % 50 mL IVPB (not administered)  heparin ADULT infusion 100 units/mL (25000 units/2562msodium chloride 0.45%) (1,000 Units/hr Intravenous New Bag/Given 09/30/17 1102)  sodium chloride 0.9 % bolus 1,000 mL (0 mLs Intravenous Stopped 09/30/17 0900)  levofloxacin (LEVAQUIN) IVPB 750 mg (0 mg Intravenous Stopped 09/30/17 1059)  aztreonam (AZACTAM) 2 g in dextrose 5 % 50 mL IVPB (0 g Intravenous Stopped 09/30/17 0900)  heparin bolus via infusion 4,000 Units (4,000 Units Intravenous Bolus from Bag 09/30/17 1101)    iopamidol (ISOVUE-370) 76 % injection (100 mLs  Contrast Given 09/30/17 1136)     Initial Impression / Assessment and Plan / ED Course  I have reviewed the triage vital signs and the nursing notes.  Pertinent labs & imaging results that were available during my care of the patient were reviewed by me and considered in my medical decision making (see chart for details).  Clinical Course as of Sep 30 1520  Tue Sep 30, 2017  0743 Spoke with cards who will come see patient.   [EH]  4742 Asked RN to in and out cath  [EH]  1301 Asked RN for urine  [EH]  1414 Spoke with Dr. Lorin Mercy who will come see patient and admit.   [EH]    Clinical Course User Index [EH] Lorin Glass, PA-C   [name] presents today for evaluation of generally feeling weak.  On exam he was found to be febrile with a rectal temp of 101.2, tachypneic, with a Flood count of 15.5, lactic minimally elevated at 2.09.  Code sepsis was called, based on reported allergy to Keflex he was started on broad-spectrum including Levaquin, azotreonam, and vancomycin.  As he was not hypotensive and lactic was under 4 he was not given 30/kg fluid bolus.  Troponin came back elevated at 14.62.  He denied having any chest pain at this time.  EKG without acute changes.  Cardiology was consulted who recommended heparin.    Patient had a reported fall off the toilet two days ago with left sided low back pain.  While considering other causes of markedly elevated troponin considered PE, aortic dissection, among others.  CT dissection study was ordered as it would allow to evaluate for proximal/large PE, aortic dissection, and trauma from his fall 2 days ago.  No acute abnormalities.  Urine was obtained with moderate blood, 30 protein, rare bacteria with no squamous epithelial cells.  Not consistent with UTI. Cr is mildly elevated at 1.32.      Chest x-ray obtained without infiltrates, pneumothorax, or other acute process.  AST/ALT obtained, are  mildly elevated, this appears new, however he does not have any abdominal tenderness to palpation and no hepatobiliary acute abnormality seen CT.    CT head obtained without acute abnormalities.  Flu a and B swabs negative.    This patient was seen as a shared visit with Dr. Laverta Baltimore who evaluated the patient and agreed with my plan to admit.  I spoke with Dr. Lorin Mercy from hospitalist who agreed to evaluate     Final Clinical Impressions(s) / ED Diagnoses   Final diagnoses:  Generalized weakness  Elevated troponin  Sepsis, due to unspecified organism [facility])    ED Discharge Orders    None       Lorin Glass, PA-C 09/30/17 1640    Margette Fast, MD 10/01/17 [phone]

## 2017-09-30 NOTE — ED Triage Notes (Addendum)
Pt transported from home by EMS, per EMS pts wife noted pt had significant generalized weakness this am when attempting to get out of bed. No focal neuro deficits, A & O, pt in on 3L [place] 02 at all times. Only complaint is low back pain from falling off commode 2 days ago.   No IV est in route, CBG 140

## 2017-09-30 NOTE — Progress Notes (Signed)
Received patient from ED, assessment completed, VS documented, oriented patient to the room.  CHG bath completed, Will continue to monitor.

## 2017-09-30 NOTE — ED Notes (Signed)
Placed a urinal between pt's leg.

## 2017-09-30 NOTE — ED Notes (Signed)
Cardiology at bedside.

## 2017-09-30 NOTE — Progress Notes (Signed)
[place] for heparin Indication: chest pain/ACS  Allergies  Allergen Reactions  . Cephalexin     Unknown  . Doxycycline     Unknown  . Keflex [Cephalexin]   . Lac Bovis Other (See Comments)    lactose intolerant  . Pentazocine Lactate     He passed out- he had a seizure.  This occurred around 2000  . Talwin [Pentazocine] Other (See Comments)    hallucinations  . Tape Other (See Comments)    Paper tape only please.  Freddi Starr [Tizanidine Hcl] Other (See Comments)    Lightheaded and dizzy    Patient Measurements: Height: 5' 6"  (167.6 cm) Weight: 199 lb 15.3 oz (90.7 kg) IBW/kg (Calculated) : 63.8 Heparin Dosing Weight: 83 kg  Vital Signs: BP: 115/75 (02/05 1130) Pulse Rate: 89 (02/05 1130)  Labs: Recent Labs    09/30/17 0654 09/30/17 0818 09/30/17 1228 09/30/17 1519 09/30/17 1804  HGB 10.9*  --   --   --   --   HCT 34.5*  --   --   --   --   PLT 229  --   --   --   --   LABPROT  --  14.7  --   --   --   INR  --  1.16  --   --   --   HEPARINUNFRC  --   --   --   --  0.19*  CREATININE 1.32*  --   --   --   --   TROPONINI  --   --  14.62* 13.44*  --     Estimated Creatinine Clearance: 52.6 mL/min (A) (by C-G formula based on SCr of 1.32 mg/dL (H)).  Assessment: CC/HPI: generalized weakness  PMH: depression, HTN, HLD, CAD s/p CABG, OSA, CVA, EtOH abuse, DM, GERD, aflutter, seizuers  Anticoag: none pta - iv hep for r/o ACS  CV: trop 13.14  Heme/Onc: H&H 10.9/34.5, Plt 229  Goal of Therapy:  Heparin level 0.3-0.7 units/ml Monitor platelets by anticoagulation protocol: Yes   Plan:  Heparin bolus 4000 units x 1 Hep gtt 1000 units/hr Initial lvl 1700  Daily HL CBC F/U cards plans  Levester Fresh, PharmD, BCPS, BCCCP Clinical Pharmacist Clinical phone for 2/5/2019from 7a-3:30p: [phone] If after 3:30p, please call main pharmacy at: [phone] 09/30/2017 9:51 AM  ADDENDUM:  Initial heparin level low at 0.19. No bleed  or IV line issues per RN.  Plan: Increase heparin to 1250 units/hr 8h heparin level Daily heparin level/CBC Monitor for s/sx bleeding  Elicia Lamp, PharmD, BCPS Clinical Pharmacist 09/30/2017 7:29 PM

## 2017-09-30 NOTE — Consult Note (Signed)
Cardiology Admission History and Physical:   Patient ID: [name]; MRN: [id number]; DOB: December 01, 1945   Admission date: 09/30/2017  Primary Care Provider: Bernell List, MD Primary Cardiologist: Peter Martinique, MD  Primary Electrophysiologist:  Dr. Caryl Comes  Chief Complaint:  Weakness severe this AM  Patient Profile:   [name] is a 72 y.o. male who is being seen today for the evaluation of elevated troponin at the request of Ms. Delane Ginger . [name] is a 72 y.o. male with a history of CAD with CABG in 2005, HTN, HLD, PE, OSA on CPAP, chronic diastolic HF, Hx cryptogenic stoke with loop recorder- now no longer working 72 yrs old, on Eliquis, and chronic home 02 for for possible asbestos toxicity, now presenting with severe weakness and troponin is 13.   History of Present Illness:   [name] a history of CAD with CABG in 2005, HTN, HLD, PE, OSA on CPAP, chronic diastolic HF, Hx cryptogenic stoke with loop recorder, on Eliquis, and chronic home 02 for for possible asbestos toxicity.  He has PAD with 70% Rt internal carotid stenosis followed by neurology and hx of seizures.    Echocardiogram 9/15 demonstrated normal LV function; this was undertaken when he had presented with a history of dyspnea and was found on angiography to have pulmonary embolism.   (LIMA->LAD; SVG->diag; SVG->LCX marg.; SVG ->RCA  12/21/2003)  Echo 08/29/17 with EF 50-55%.  G1 DD  Aortic valve: Trileaflet; moderately thickened, moderately calcified leaflets. Valve mobility was restricted  Mitral valve: Calcified annulus. There was mild regurgitation Rt and Lt atriums mildly dilated. Low risk nuc in 2016,    Was admitted 09/01/17 with HF, NSTEMI  ie: elevated troponin thought to be demand ischemia from HF pk troponin then 0.61.  Was diuresed.  He did have AKI on that admit but at discharge Cr 0.90   Was in rehab in [place] and had c. Diff,  Just discharged over a week ago.  Had been doing well but yesterday was  weaker that progressed overnight.  He could not move his legs last night.  Wife had trouble getting him up, so EMS called.   Denies any chest pain but has never had chest pain, even with CABG.  No SOB.  No fevers prior to admit.  No diarrhea.  EKG, I personally reviewed with ST depression lateral leads similar to prior EKGs.    Troponin is 13.14 Cr 1.32, K+ 4.8, AST 143, ALT 81  WBC 15.5  Hgb 10.9, hct 34.5   Lactic acid 3.51 Temp is 101.2 rectal  resp 16 on arrival and BP 106/73  CXR:  No evidence of pneumonia or explanation for fever. Cardiomegaly and low lung volumes.  No overt congestive failure. Aortic Atherosclerosis (ICD10-I70.0).  Currently very weak, can hardly talk.  His wife answered most of my questions.   Again no chest pain.  + fever.   Past Medical History:  Diagnosis Date  . AKI (acute kidney injury) ([place])    a. 6/14: resolved after d/c ARB;  b. RA U/S 7/14: no RA stenosis  . Arthritis    of spine  . Atrial flutter ([place])    a. 2008 - one episode in setting of sepsis/shock.  . Back pain    persistent  . Coronary artery disease    a. s/p CABG 2005. b. low risk nuc 2016.  [place] Kitchen Depression   . Diabetes mellitus    controled by diet  . Diverticulitis  a. s/p partial colectomy remotely.  Marland Kitchen GERD (gastroesophageal reflux disease)   . H/O alcohol abuse   . Headache(784.0)   . Hearing loss   . History of hiatal hernia   . Hx of echocardiogram    Echo 7/14:  Mod LVH, EF 55-60%, Gr 1 DD, mild MR, PASP 36  . Hyperlipidemia   . Hypertension   . Pulmonary embolism ([place]) 2005  . Renal stone 04/15/2014  . Seizures ([place])   . Sleep apnea    does not use CPAP  . Stroke [facility])     Past Surgical History:  Procedure Laterality Date  . APPENDECTOMY    . BACK SURGERY    . CARDIAC CATHETERIZATION    . CERVICAL FUSION    . CORONARY ARTERY BYPASS GRAFT  2005   LIMA graft to LAD,saphenous vein graft to diag.,circumflex, marginal,and to the RCA  .  CYSTOSCOPY/RETROGRADE/URETEROSCOPY/STONE EXTRACTION WITH BASKET Left 04/16/2014   Procedure: CYSTOSCOPY/RETROGRADE/LEFT URETEROSCOPY/STONE EXTRACTION WITH BASKET AND LASER LITHOTRIPSY;  Surgeon: Malka So, MD;  Location: [facility] ORS;  Service: Urology;  Laterality: Left;  With STENT  . ESOPHAGOGASTRODUODENOSCOPY  02/10/2012   Procedure: ESOPHAGOGASTRODUODENOSCOPY (EGD);  Surgeon: Cleotis Nipper, MD;  Location[facility] END[facility]COPY;  Service: Endoscopy;  Laterality: N/A;  . ESOPHAGOGASTRODUODENOSCOPY  06/29/2012   Procedure: ESOPHAGOGASTRODUODENOSCOPY (EGD);  Surgeon: Winfield Cunas., MD;  Location: [facility] ENDOSCOPY;  Service: Endoscopy;  Laterality: N/A;  . FOREIGN BODY REMOVAL  02/10/2012   Procedure: FOREIGN BODY REMOVAL;  Surgeon: Cleotis Nipper, MD;  Location: [place];  Service: Endoscopy;  Laterality: N/A;  . FOREIGN BODY REMOVAL  06/29/2012   Procedure: FOREIGN BODY REMOVAL;  Surgeon: Winfield Cunas., MD;  Location: [facility] ENDOSCOPY;  Service: Endoscopy;  Laterality: N/A;  . LEFT HEART CATHETERIZATION WITH CORONARY ANGIOGRAM N/A 04/02/2012   Procedure: LEFT HEART CATHETERIZATION WITH CORONARY ANGIOGRAM;  Surgeon: Sinclair Grooms, MD;  Location: [facility] CATH LAB;  Service: Cardiovascular;  Laterality: N/A;  . LOOP RECORDER IMPLANT N/A 11/17/2013   Procedure: LOOP RECORDER IMPLANT;  Surgeon: Deboraha Sprang, MD;  Location: [facility] CATH LAB;  Service: Cardiovascular;  Laterality: N/A;  . ORCHIECTOMY    . PARTIAL COLECTOMY     for diverticuli  . SAVORY DILATION  06/29/2012   Procedure: SAVORY DILATION;  Surgeon: Winfield Cunas., MD;  Location: [facility] ENDOSCOPY;  Service: Endoscopy;  Laterality: N/A;  . TEE WITHOUT CARDIOVERSION N/A 11/17/2013   Procedure: TRANSESOPHAGEAL ECHOCARDIOGRAM (TEE);  Surgeon: Larey Dresser, MD;  Location: [place];  Service: Cardiovascular;  Laterality: N/A;  . UMBILICAL HERNIA REPAIR       Medications Prior to Admission: Prior to Admission medications   Medication Sig Start  Date End Date Taking? Authorizing Provider  acetaminophen (TYLENOL) 325 MG tablet Take 650 mg by mouth every 6 (six) hours as needed for moderate pain.    [provider]  ARIPiprazole (ABILIFY) 5 MG tablet Take 5 mg by mouth daily.     [provider]  aspirin 325 MG EC tablet Take 325 mg daily by mouth.    [provider]  atorvastatin (LIPITOR) 80 MG tablet Take 1 tablet (80 mg total) by mouth daily. Patient not taking: Reported on 08/28/2017 10/29/13   Copland, Gay Filler, MD  camphor-menthol [facility]) lotion Apply 1 application topically daily. To entire body    [provider]  donepezil (ARICEPT ODT) 10 MG disintegrating tablet Take 10 mg by mouth daily.     [provider]  Fluocinolone Acetonide 0.01 % OIL Apply 1 application topically daily as needed (itching). Apply to scalp    [provider]  FLUoxetine (PROZAC) 20 MG capsule Take 40 mg by mouth daily.    [provider]  fluticasone (FLONASE) 50 MCG/ACT nasal spray Place 1 spray into both nostrils 2 (two) times daily as needed for rhinitis.     [provider]  furosemide (LASIX) 20 MG tablet TAKE 1-2 TABLETS BY MOUTH EVERY DAY AS NEEDED Patient taking differently: 20 mg mg DAILY in the morning (4am) 10/24/14   Martinique, Peter M, MD  glipiZIDE (GLUCOTROL) 5 MG tablet Take 2.5 mg by mouth 2 (two) times daily before a meal. 30 minutes prior to meal    [provider]  lamoTRIgine (LAMICTAL) 200 MG tablet Take 100 mg by mouth 2 (two) times daily.    [provider]  Multiple Vitamin (MULTIVITAMIN) capsule Take 1 capsule by mouth daily.    [provider]  nitroGLYCERIN (NITROSTAT) 0.4 MG SL tablet Place 1 tablet (0.4 mg total) under the tongue every 5 (five) minutes as needed. For chest pain 03/10/15   Martinique, Peter M, MD  ONE [facility] ULTRA TEST test strip USE TO TEST BLOOD SUGAR DAILY 07/11/15   Darlyne Russian, MD  potassium citrate (UROCIT-K) 10 MEQ  (1080 MG) SR tablet Take 10 mEq 2 (two) times daily by mouth.  07/08/14   [provider]  traZODone (DESYREL) 100 MG tablet Take 100 mg by mouth at bedtime.     [provider]  Triamcinolone Acetonide (TRIAMCINOLONE 0.1 % CREAM : EUCERIN) CREA Apply 1 application topically daily. TRIAMCINOLONE 1%/ Absorbase    [provider]  vancomycin (VANCOCIN) 50 mg/mL oral solution Take 2.5 mLs (125 mg total) by mouth every 6 (six) hours. 09/01/17   Patrecia Pour, MD     Allergies:    Allergies  Allergen Reactions  . Cephalexin     Unknown  . Doxycycline     Unknown  . Keflex [Cephalexin]   . Lac Bovis Other (See Comments)    lactose intolerant  . Pentazocine Lactate     He passed out- he had a seizure.  This occurred around 2000  . Talwin [Pentazocine] Other (See Comments)    hallucinations  . Tape Other (See Comments)    Paper tape only please.  Freddi Starr [Tizanidine Hcl] Other (See Comments)    Lightheaded and dizzy    Social History:   Social History   Socioeconomic History  . Marital status: Married    Spouse name: Clinical research associate  . Number of children: 2  . Years of education: college  . Highest education level: Not on file  Social Needs  . Financial resource strain: Not on file  . Food insecurity - worry: Not on file  . Food insecurity - inability: Not on file  . Transportation needs - medical: Not on file  . Transportation needs - non-medical: Not on file  Occupational History  . 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 Smoking status: Never Smoker  . Smokeless tobacco: Never Used  Substance and Sexual Activity  . Alcohol use: No    Comment: pt states "I used to but not anymore"  . Drug use: No  . Sexual activity: Not Currently  Other Topics Concern  . Not on file  Social History Narrative   Patient lives at home  with  wife      Patient drinks 2 cups of coffee a day.patient is left handed      Family History:   The patient's family history includes Asthma in his mother; Emphysema in his mother; Heart disease in his father; Hypertension in his mother; Pancreatic cancer in his paternal uncle; Prostate cancer in his paternal grandfather; Stroke in his paternal grandmother. There is no history of Heart attack.    ROS:  Please see the history of present illness.  lactose intolerance.   General:no colds + fever today, no weight changes Skin:no rashes or ulcers HEENT:no blurred vision, no congestion CV:see HPI PUL:see HPI GI:no diarrhea constipation or melena, no indigestion GU:no hematuria, no dysuria MS:no joint pain, no claudication Neuro:no syncope, no lightheadedness, very weak Endo:+ diabetes, no thyroid disease   Physical Exam/Data:   Vitals:   09/30/17 0641 09/30/17 0648 09/30/17 0700 09/30/17 0730  BP:  106/73  110/74  Pulse: 91 91  86  Resp: 18 16  (!) 22  Temp: 99.4 F (37.4 C) 99.2 F (37.3 C) (!) 101.2 F (38.4 C)   TempSrc: Oral Oral Rectal   SpO2: 94% 95%  93%  Weight:   199 lb 15.3 oz (90.7 kg)   Height:   5' 6"  (1.676 m)    No intake or output data in the 24 hours ending 09/30/17 0751 Filed Weights   09/30/17 0700  Weight: 199 lb 15.3 oz (90.7 kg)   Body mass index is 32.27 kg/m.  General:  Well nourished, well developed, in no acute distress but looks unwell HEENT: normal Lymph: no adenopathy Neck: no JVD Endocrine:  No thryomegaly Vascular: No carotid bruits; 2+ pedal pulses bil Cardiac:  normal S1, S2; RRR; no murmur gallup rub or click Lungs:  clear to auscultation bilaterally, no wheezing, rhonchi or rales  Abd: soft, nontender, no hepatomegaly  Ext: no to trace edema Musculoskeletal:  No deformities, BUE and BLE strength normal and equal but weak Skin: warm and dry  Neuro:  Alert and oriented, no focal abnormalities noted but generalized wekaness Psych:  flat affect     Relevant CV Studies: Echo 08/29/17 Study Conclusions  -  Left ventricle: The cavity size was normal. Wall thickness was   normal. Systolic function was normal. The estimated ejection   fraction was in the range of 50% to 55%. There is hypokinesis of   the basal-midinferior myocardium. Doppler parameters are   consistent with abnormal left ventricular relaxation (grade 1   diastolic dysfunction). - Aortic valve: Trileaflet; moderately thickened, moderately   calcified leaflets. Valve mobility was restricted. - Aorta: The aorta was mildly calcified. - Mitral valve: Calcified annulus. There was mild regurgitation. - Left atrium: The atrium was mildly dilated. - Right atrium: The atrium was mildly dilated.  Laboratory Data:  ChemistryNo results for input(s): NA, K, CL, CO2, GLUCOSE, BUN, CREATININE, CALCIUM, GFRNONAA, GFRAA, ANIONGAP in the last 168 hours.  No results for input(s): PROT, ALBUMIN, AST, ALT, ALKPHOS, BILITOT in the last 168 hours. Hematology Recent Labs  Lab 09/30/17 0654  WBC 15.5*  RBC 3.74*  HGB 10.9*  HCT 34.5*  MCV 92.2  MCH 29.1  MCHC 31.6  RDW 15.9*  PLT 229   Cardiac EnzymesNo results for input(s): TROPONINI in the last 168 hours.  Recent Labs  Lab 09/30/17 0700  TROPIPOC 13.14*    BNPNo results for input(s): BNP, PROBNP in the last 168 hours.  DDimer No results for input(s): DDIMER in the  last 168 hours.  Radiology/Studies:  Dg Chest Portable 1 View  Result Date: 09/30/2017 CLINICAL DATA:  Fever EXAM: PORTABLE CHEST 1 VIEW COMPARISON:  08/26/2017 FINDINGS: Prior median sternotomy. Numerous leads and wires project over the chest. Patient rotated to the right. Midline trachea. Moderate cardiomegaly. Atherosclerosis in the transverse aorta. No pleural effusion or pneumothorax. Low lung volumes with resultant pulmonary interstitial prominence. Mild bibasilar atelectasis. No lobar consolidation. IMPRESSION: No evidence of pneumonia or explanation for fever. Cardiomegaly and low lung volumes.  No overt congestive  failure. Aortic Atherosclerosis (ICD10-I70.0). Electronically Signed   By: Abigail Miyamoto M.D.   On: 09/30/2017 07:46    Assessment and Plan:   1. Elevated troponin poc at 13.4, serial troponins EKG without acute changes.  On Eliquis with hx PE, would change to IV heparin for now.  No chest pain. May be septic - we will follow - no cath for now due to illness.    2.          CAD with hx CABG 2005, last nuc in 2016 was neg for ischemia - may need cath to eval. CAD and grafts but unable to do with current fever elevated WBC.  Dr. Irish Lack to see.    3.           Fever, leukocytosis, has rec'd ABX per ER, flu eval in process.  Also recent C. Diff infection per his wife.    4.            Hx CVA does have loop recorder but battery is now dead. But anticoagulated due to hx of PE.  5.            DM-2 per IM  6.             HLD on lipitor   7.             Diastolic HF on last admit, monitor fluids with infection.  Was on lasix 20 mg daily prior to admit.    For questions or updates, please contact [place] Please consult [email or web address] for contact info under Cardiology/STEMI.    Signed, Cecilie Kicks, NP  09/30/2017 7:51 AM   I have examined the patient and reviewed assessment and plan and discussed with patient.  Agree with above as stated.  He has had recent medical illness including C.Diff.  His only symptom currently is weakness.  He denies any significant chest discomfort or shortness of breath.  Troponin elevation may be a result of his systemic illness.  Given history of coronary artery disease, will heparinize for now.  We will trend enzymes.  Depending on his symptoms and lab values, could consider further ischemic workup at a later time when his acute illness has resolved.  His generalized weakness, elevated Sharps count and fever are more likely to be causing his current symptoms, and may be responsible for demand ischemia.  Larae Grooms

## 2017-09-30 NOTE — Progress Notes (Signed)
Pharmacy Antibiotic Note  [name] is a 72 y.o. male admitted on 09/30/2017 with sepsis.  Pharmacy has been consulted for vancomycin, aztreonam and levaquin dosing. Tmax is 101.2, WBC is elevated at 15.5 and lactic acid is elevated at 3.51. SCr is slightly above baseline at 1.32.   Plan: Vanc 2gm IV x 1 then 750m IV Q12H Aztreo 2gm IV x 1 then 1gm IV Q8H Levaquin 7515mIV Q24H F/u renal fxn, C&S, clinical status and trough at SS  Height: 5' 6"  (167.6 cm) Weight: 199 lb 15.3 oz (90.7 kg) IBW/kg (Calculated) : 63.8  Temp (24hrs), Avg:99.9 F (37.7 C), Min:99.2 F (37.3 C), Max:101.2 F (38.4 C)  No results for input(s): WBC, CREATININE, LATICACIDVEN, VANCOTROUGH, VANCOPEAK, VANCORANDOM, GENTTROUGH, GENTPEAK, GENTRANDOM, TOBRATROUGH, TOBRAPEAK, TOBRARND, AMIKACINPEAK, AMIKACINTROU, AMIKACIN in the last 168 hours.  CrCl cannot be calculated (Patient's most recent lab result is older than the maximum 21 days allowed.).    Allergies  Allergen Reactions  . Cephalexin     Unknown  . Doxycycline     Unknown  . Keflex [Cephalexin]   . Lac Bovis Other (See Comments)    lactose intolerant  . Pentazocine Lactate     He passed out- he had a seizure.  This occurred around 2000  . Talwin [Pentazocine] Other (See Comments)    hallucinations  . Tape Other (See Comments)    Paper tape only please.  . Zanaflex [Tizanidine Hcl] Other (See Comments)    Lightheaded and dizzy    Antimicrobials this admission: Vanc 2/5>> Levaquin 2/5>> Aztreo 2/5>>  Dose adjustments this admission: N/A  Microbiology results: Pending  Thank you for allowing pharmacy to be a part of this patient's care.  Ronin Rehfeldt, RaRande Lawman/12/2017 7:08 AM

## 2017-10-01 ENCOUNTER — Inpatient Hospital Stay (HOSPITAL_COMMUNITY): Payer: Medicare Other

## 2017-10-01 DIAGNOSIS — E1159 Type 2 diabetes mellitus with other circulatory complications: Secondary | ICD-10-CM

## 2017-10-01 DIAGNOSIS — I361 Nonrheumatic tricuspid (valve) insufficiency: Secondary | ICD-10-CM

## 2017-10-01 DIAGNOSIS — E1165 Type 2 diabetes mellitus with hyperglycemia: Secondary | ICD-10-CM

## 2017-10-01 DIAGNOSIS — F0150 Vascular dementia without behavioral disturbance: Secondary | ICD-10-CM

## 2017-10-01 DIAGNOSIS — I340 Nonrheumatic mitral (valve) insufficiency: Secondary | ICD-10-CM

## 2017-10-01 DIAGNOSIS — E118 Type 2 diabetes mellitus with unspecified complications: Secondary | ICD-10-CM

## 2017-10-01 LAB — HEPARIN LEVEL (UNFRACTIONATED)
HEPARIN UNFRACTIONATED: 0.52 [IU]/mL (ref 0.30–0.70)
HEPARIN UNFRACTIONATED: 0.52 [IU]/mL (ref 0.30–0.70)

## 2017-10-01 LAB — CBC
HCT: 36.8 % — ABNORMAL LOW (ref 39.0–52.0)
Hemoglobin: 11.4 g/dL — ABNORMAL LOW (ref 13.0–17.0)
MCH: 28.9 pg (ref 26.0–34.0)
MCHC: 31 g/dL (ref 30.0–36.0)
MCV: 93.4 fL (ref 78.0–100.0)
Platelets: 169 10*3/uL (ref 150–400)
RBC: 3.94 MIL/uL — AB (ref 4.22–5.81)
RDW: 15.9 % — AB (ref 11.5–15.5)
WBC: 14 10*3/uL — ABNORMAL HIGH (ref 4.0–10.5)

## 2017-10-01 LAB — LIPID PANEL
CHOLESTEROL: 170 mg/dL (ref 0–200)
HDL: 54 mg/dL (ref >40)
LDL Cholesterol: 85 mg/dL (ref 0–99)
TRIGLYCERIDES: 153 mg/dL — AB (ref <150)
Total CHOL/HDL Ratio: 3.1 RATIO
VLDL: 31 mg/dL (ref 0–40)

## 2017-10-01 LAB — BASIC METABOLIC PANEL
ANION GAP: 12 (ref 5–15)
BUN: 33 mg/dL — ABNORMAL HIGH (ref 6–20)
CALCIUM: 8.4 mg/dL — AB (ref 8.9–10.3)
CHLORIDE: 104 mmol/L (ref 101–111)
CO2: 22 mmol/L (ref 22–32)
Creatinine, Ser: 1.1 mg/dL (ref 0.61–1.24)
GFR calc non Af Amer: >60 mL/min (ref >60)
Glucose, Bld: 155 mg/dL — ABNORMAL HIGH (ref 65–99)
Potassium: 4.6 mmol/L (ref 3.5–5.1)
SODIUM: 138 mmol/L (ref 135–145)

## 2017-10-01 LAB — GLUCOSE, CAPILLARY
GLUCOSE-CAPILLARY: 168 mg/dL — AB (ref 65–99)
Glucose-Capillary: 165 mg/dL — ABNORMAL HIGH (ref 65–99)
Glucose-Capillary: 199 mg/dL — ABNORMAL HIGH (ref 65–99)
Glucose-Capillary: 165 mg/dL — ABNORMAL HIGH (ref 65–99)

## 2017-10-01 LAB — PROTIME-INR
INR: 1.44
Prothrombin Time: 17.4 seconds — ABNORMAL HIGH (ref 11.4–15.2)

## 2017-10-01 LAB — TROPONIN I
Troponin I: 12 ng/mL (ref <0.03)
Troponin I: 9.1 ng/mL (ref <0.03)

## 2017-10-01 LAB — URINE CULTURE: Culture: NO GROWTH

## 2017-10-01 LAB — ECHOCARDIOGRAM LIMITED
Height: 67 in
WEIGHTICAEL: 3178.15 [oz_av]

## 2017-10-01 LAB — LACTIC ACID, PLASMA
LACTIC ACID, VENOUS: 2 mmol/L — AB (ref 0.5–1.9)
Lactic Acid, Venous: 2.1 mmol/L (ref 0.5–1.9)
Lactic Acid, Venous: 2.1 mmol/L (ref 0.5–1.9)

## 2017-10-01 LAB — MRSA PCR SCREENING: MRSA by PCR: NEGATIVE

## 2017-10-01 MED ORDER — ORAL CARE MOUTH RINSE
15.0000 mL | Freq: Two times a day (BID) | OROMUCOSAL | Status: DC
  Administered 2017-10-01 – 2017-10-11 (×13): 15 mL via OROMUCOSAL

## 2017-10-01 NOTE — Progress Notes (Signed)
CRITICAL VALUE ALERT  Critical Value:  Lactic acid 2.1  Date & Time Notied:  10/01/2017 0845  Provider Notified: Dr. Sherral Hammers

## 2017-10-01 NOTE — Progress Notes (Signed)
Progress Note  Patient Name: [name] Date of Encounter: 10/01/2017  Primary Cardiologist: Dr. Martinique   Subjective   Very weak, lethargic but will answer questions. Reports back pain.   Inpatient Medications    Scheduled Meds: . ARIPiprazole  5 mg Oral Daily  . aspirin  325 mg Oral Daily  . atorvastatin  80 mg Oral Daily  . donepezil  10 mg Oral Daily  . FLUoxetine  40 mg Oral Daily  . insulin aspart  0-15 Units Subcutaneous TID WC  . insulin aspart  0-5 Units Subcutaneous QHS  . lamoTRIgine  100 mg Oral BID  . mouth rinse  15 mL Mouth Rinse BID  . metoprolol tartrate  12.5 mg Oral BID  . potassium citrate  10 mEq Oral BID  . sodium chloride flush  3 mL Intravenous Q12H  . traZODone  100 mg Oral QHS   Continuous Infusions: . sodium chloride    . aztreonam Stopped (10/01/17 0906)  . heparin 1,250 Units/hr (10/01/17 0711)  . lactated ringers 100 mL/hr at 10/01/17 1000  . levofloxacin (LEVAQUIN) IV 750 mg (10/01/17 0844)  . vancomycin 750 mg (10/01/17 1000)   PRN Meds: sodium chloride, acetaminophen, fluticasone, nitroGLYCERIN, ondansetron (ZOFRAN) IV, sodium chloride flush   Vital Signs    Vitals:   09/30/17 1900 09/30/17 1930 09/30/17 2046 10/01/17 0834  BP: 110/70 117/65 114/75 110/73  Pulse: 81 82 82 76  Resp: (!) 27 (!) 21 (!) 22 14  Temp:   97.7 F (36.5 C) 97.7 F (36.5 C)  TempSrc:   Oral Oral  SpO2: 96% 97% 97% 95%  Weight:   198 lb 10.2 oz (90.1 kg)   Height:   5' 7"  (1.702 m)     Intake/Output Summary (Last 24 hours) at 10/01/2017 1007 Last data filed at 10/01/2017 0600 Gross per 24 hour  Intake 1578.25 ml  Output 300 ml  Net 1278.25 ml   Filed Weights   09/30/17 0700 09/30/17 2046  Weight: 199 lb 15.3 oz (90.7 kg) 198 lb 10.2 oz (90.1 kg)    Telemetry    SR - Personally Reviewed  ECG    SR with continue TWI in lateral leads - Personally Reviewed  Physical Exam   General: Very weak older W male appearing in no acute  distress. Head: Normocephalic, atraumatic.  Neck: Supple, no JVD. Lungs:  Resp regular and unlabored, CTA. Heart: RRR, S1, S2, no S3, S4, or murmur; no rub. Abdomen: Soft, non-tender, non-distended with normoactive bowel sounds.  Extremities: No clubbing, cyanosis, trace LE edema. Distal pedal pulses are 2+ bilaterally. Neuro: Alert and oriented X 3. Moves all extremities spontaneously. Psych: Normal affect.  Labs    Chemistry Recent Labs  Lab 09/30/17 0654 10/01/17 0302  NA 140 138  K 4.8 4.6  CL 103 104  CO2 23 22  GLUCOSE 171* 155*  BUN 38* 33*  CREATININE 1.32* 1.10  CALCIUM 9.2 8.4*  PROT 6.9  --   ALBUMIN 3.4*  --   AST 143*  --   ALT 81*  --   ALKPHOS 82  --   BILITOT 0.5  --   GFRNONAA 52* >60  GFRAA >60 >60  ANIONGAP 14 12     Hematology Recent Labs  Lab 09/30/17 0654 10/01/17 0302  WBC 15.5* 14.0*  RBC 3.74* 3.94*  HGB 10.9* 11.4*  HCT 34.5* 36.8*  MCV 92.2 93.4  MCH 29.1 28.9  MCHC 31.6 31.0  RDW 15.9* 15.9*  PLT 229 169    Cardiac Enzymes Recent Labs  Lab 09/30/17 1228 09/30/17 1519 09/30/17 2125 10/01/17 0302  TROPONINI 14.62* 13.44* 12.87* 12.00*    Recent Labs  Lab 09/30/17 0700  TROPIPOC 13.14*     BNPNo results for input(s): BNP, PROBNP in the last 168 hours.   DDimer No results for input(s): DDIMER in the last 168 hours.    Radiology    Dg Chest 2 View  Result Date: 10/01/2017 CLINICAL DATA:  Generalized weakness, diarrhea EXAM: CHEST  2 VIEW COMPARISON:  CT chest 09/30/2017 and chest x-ray of the same date FINDINGS: The lungs appear better aerated. Cardiomegaly is stable. Mediastinal and hilar contours are unchanged. Median sternotomy sutures again are noted. Implanted loop recorder is again noted. IMPRESSION: Improved aeration.  Stable cardiomegaly. Electronically Signed   By: Ivar Drape M.D.   On: 10/01/2017 09:54   Ct Head Wo Contrast  Result Date: 09/30/2017 CLINICAL DATA:  Altered mental status. Weakness. Prior  right frontal infarct. EXAM: CT HEAD WITHOUT CONTRAST TECHNIQUE: Contiguous axial images were obtained from the base of the skull through the vertex without intravenous contrast. COMPARISON:  April 15, 2017 FINDINGS: Brain: Mild diffuse atrophy is stable. There is a cavum septum pellucidum, an anatomic variant. There is no intracranial mass, hemorrhage, extra-axial fluid collection, or midline shift. There is evidence of a prior infarct in the right frontal lobe with extension into the right centrum semiovale superiorly, stable. There is evidence of a prior small infarct in the medial left occipital lobe posteriorly. There is evidence of prior infarct in the superior left caudate nucleus head. There is small vessel disease in both anterior limbs of the internal capsules. There is periventricular small vessel disease in the centra semiovale in this area on both sides. There is small vessel disease in each thalamus. No acute infarct is demonstrable on this study. Vascular: No appreciable hyperdense vessel. There is calcification in each carotid siphon region as well as in each distal vertebral artery. Skull: Bony calvarium appears intact. Sinuses/Orbits: There is mucosal thickening in several ethmoid air cells. Other paranasal sinuses are clear. Orbits appear symmetric bilaterally except for previous cataract removal on the left. Other: Mastoid air cells are clear. IMPRESSION: Atrophy with prior infarcts and areas of small vessel disease, essentially stable. Largest prior infarct involves the right frontal lobe. No acute infarct evident. No mass or hemorrhage. Foci of arterial vascular calcification noted. Mucosal thickening noted in several ethmoid air cells. Electronically Signed   By: Lowella Grip III M.D.   On: 09/30/2017 12:06   Dg Chest Portable 1 View  Result Date: 09/30/2017 CLINICAL DATA:  Fever EXAM: PORTABLE CHEST 1 VIEW COMPARISON:  08/26/2017 FINDINGS: Prior median sternotomy. Numerous leads and  wires project over the chest. Patient rotated to the right. Midline trachea. Moderate cardiomegaly. Atherosclerosis in the transverse aorta. No pleural effusion or pneumothorax. Low lung volumes with resultant pulmonary interstitial prominence. Mild bibasilar atelectasis. No lobar consolidation. IMPRESSION: No evidence of pneumonia or explanation for fever. Cardiomegaly and low lung volumes.  No overt congestive failure. Aortic Atherosclerosis (ICD10-I70.0). Electronically Signed   By: Abigail Miyamoto M.D.   On: 09/30/2017 07:46   Ct Angio Chest/abd/pel For Dissection W And/or W/wo  Result Date: 09/30/2017 CLINICAL DATA:  Low back pain. Generalized weakness. Concern for aortic dissection EXAM: CT ANGIOGRAPHY CHEST, ABDOMEN AND PELVIS TECHNIQUE: Multidetector CT imaging through the chest, abdomen and pelvis was performed using the standard protocol during bolus administration of intravenous contrast.  Multiplanar reconstructed images and MIPs were obtained and reviewed to evaluate the vascular anatomy. CONTRAST:  130m ISOVUE-370 IOPAMIDOL (ISOVUE-370) INJECTION 76% COMPARISON:  CT abdomen 08/27/2017 FINDINGS: CTA CHEST FINDINGS Cardiovascular: Non IV contrast imaging demonstrates no intramural hematoma. Intimal calcification aorta aorta and great vessels. Contrast enhanced series demonstrates no dissection of the ascending, transverse, or descending thoracic aorta. No aneurysm. No evidence acute pulmonary embolism. Post CABG anatomy. Mediastinum/Nodes: No axillary or supraclavicular adenopathy. No mediastinal adenopathy. No hilar adenopathy. Lungs/Pleura: No suspicious pulmonary nodule. Calcified pleural surface within LEFT RIGHT hemithorax Musculoskeletal: No aggressive osseous lesion. Review of the MIP images confirms the above findings. CTA ABDOMEN AND PELVIS FINDINGS VASCULAR Aorta: Normal caliber without evidence of dissection. Celiac: Widely patent.  Mild ostial calcifications. SMA: Widely patent Renals: Patent  single renal arteries with ostial calcification. Stenosis of the proximal RIGHT renal artery (image 76, series 10) with post stenotic dilatation and full reconstitution of the renal artery. IMA: Patent Inflow: Normal Veins: Normal Review of the MIP images confirms the above findings. NON-VASCULAR Hepatobiliary: No focal hepatic lesion. No biliary duct dilatation. Gallbladder is normal. Common bile duct is normal. Pancreas: Pancreas is normal. No ductal dilatation. No pancreatic inflammation. Spleen: Normal spleen Adrenals/urinary tract: Adrenal glands and kidneys are normal. The ureters and bladder normal. Stomach/Bowel: Stomach, small bowel, appendix, and cecum are normal. The colon and rectosigmoid colon are normal. Vascular/Lymphatic: Abdominal aorta is normal caliber. There is no retroperitoneal or periportal lymphadenopathy. No pelvic lymphadenopathy. Reproductive: Prostate normal. Penile reservoir within the RIGHT lower quadrant. No complicating features. Other: No free fluid. Musculoskeletal: Posterior lumbar fusion Review of the MIP images confirms the above findings. IMPRESSION: Chest Impression: 1. No evidence of thoracic aneurysm or dissection. 2. Pleural calcification in the LEFTand RIGHT lung. 3. Coronary artery calcification and aortic atherosclerotic calcification. Abdomen / Pelvis Impression: 1. No evidence of abdominal aortic aneurysm or dissection. 2. Scattered intimal calcification of the aorta. 3. No acute abdominopelvic findings. Electronically Signed   By: SSuzy BouchardM.D.   On: 09/30/2017 12:22    Cardiac Studies   TTE: 08/29/17  Study Conclusions  - Left ventricle: The cavity size was normal. Wall thickness was   normal. Systolic function was normal. The estimated ejection   fraction was in the range of 50% to 55%. There is hypokinesis of   the basal-midinferior myocardium. Doppler parameters are   consistent with abnormal left ventricular relaxation (grade 1   diastolic  dysfunction). - Aortic valve: Trileaflet; moderately thickened, moderately   calcified leaflets. Valve mobility was restricted. - Aorta: The aorta was mildly calcified. - Mitral valve: Calcified annulus. There was mild regurgitation. - Left atrium: The atrium was mildly dilated. - Right atrium: The atrium was mildly dilated.   Patient Profile     72y.o. male with a history of CAD with CABG in 2005, HTN, HLD, PE, OSA on CPAP, chronic diastolic HF, Hx cryptogenic stoke with loop recorder- now no longer working 72yrs old, on Eliquis, and chronic home 02 for for possible asbestos toxicity, now presenting with severe weakness and troponin is 13.  Assessment & Plan    1. Elevated Troponins: in the setting of acute illness/sepsis. Peaked at 14.62, now trending down. Continues to deny any chest pain. Can consider further ischemic work up pending his clinical improvement as he remains very weak today.  -- remains on IV heparin. On medical therapy with ASA, statin, BB  2. Sepsis: on antibiotic therapy per primary. Lactic acid trending down,  along with WBC.   3. CAD with Hx of CABG ('05): Last nuc in 2016 was negative for ischemia. Further work up pending clinical improvement.   4. Chronic diastolic HF: Volume stable on exam. Lasix was held on admission.   Signed, Reino Bellis, NP  10/01/2017, 10:07 AM  Pager # [phone]    I have examined the patient and reviewed assessment and plan and discussed with patient.  Agree with above as stated.  Troponin trending sown.  No chest pain.  COntinue supportive therapy for his infection.  No plans for cardiac testing at this time. COntinue medical therapy.  Tomorrow will be 48 hours of heparin.   Larae Grooms   For questions or updates, please contact [facility] Please consult [email or web address] for contact info under Cardiology/STEMI.

## 2017-10-01 NOTE — Progress Notes (Signed)
PROGRESS NOTE    ADRIEL KESSEN  VHQ:[id number] DOB: 1946/03/15 DOA: 09/30/2017 PCP: Bernell List, MD   Brief Narrative:  72 yo WM PMHx  seizures, depression,CVA; OSA not on CPAP; seizures; HTN; HLD; Dementia;  ETOH abuse; DM(diet-controlled); CAD s/p CABG 4132; chronic diastolic CHF 4401, atrial flutter, AKI, Diverticulitis  s/p partial colectomy, hiatal hernia, PE 2005, nephrolithiasis 04/15/2014  Presenting with weakness.  Initially, the patient provided the history and said "I don't know" what brought him in.  He started feeling bad 2-3 years ago.  Started feeling like he does today 2-3 weeks ago.  Can't eat.  No SOB.  Occasional cough, not new, productive of clear sputum.  No fever at home.  +headache x 2-3 years, confusion x 2-3 months.  Hurts left lower back.  No chest pain, palpitations.  No abdominal pain.  No N/V/D/C.   No rash.  +weakness.  Occasional dysphagia.   Based on minimal meaningful history provided by the patient, I called and spoke to his wife by telephone.  He was previously admitted here from 1/1-7 with C. Diff colitis and discharged on oral vancomycin; he has troponin elevation thought to be related to demand ischemia and also had a BIPAP requirement qhs, possibly due to diastolic heart failure.  He left and was sent to [facility] and then he was transferred to the [place] in [place] for rehab.  His wife brought him home a week ago and he was negative for C diff prior to discharge.  2 days ago he started walking slower and leaning to his left.  Last night, he was unable to stand for his shower.  He got out of the bed overnight and fell onto his buttocks.  His wife was able to get him up and back in bed but then it happened again.  Her grandson was able to pick him up and they placed him in a lift recliner.  He was "confused with his feet."  Prior CVA with left-sided weakness but now with apparent left-sided weakness.  He has been turning his feet inward but he did not understand what  she was saying.  He did not have a fever at home until just as she called EMS.  He is intermittently confused at home.  Yesterday, he was breathing in a pattern that was bothersome to the family - "he sucks air in through his mouth and swallows it and has to burp or pass gas and he did that all day long yesterday."  No cough until today.  ?productive sputum.  Urine with an odor today.  +diarrhea but this is chronic after colonic resection; this has been better than when he had C diff.       ED Course: Sepsis - started on Vanc/Levaquin/Aztreonam.  Uncertain source - flu negative.  CXR and CTA negative.  Urine unremarkable.  Cardiology consulted - no cath while floridly septic, start Heparin, they will follow.    Subjective: 2/6 A/O x 4 negative CP, negative SOB, negative abdominal pain. Positive fatigue    Assessment & Plan:   Principal Problem:   Sepsis ([place]) Active Problems:   Diabetes mellitus ([place])   Obstructive sleep apnea   Essential hypertension   NSTEMI (non-ST elevated myocardial infarction) ([place])   Dementia   Sepsis unspecified organism  -Continue current antibiotics: Leukocytosis and lactic acidosis improving -Panculture pending: Currently no source -Repeat PCXR inconclusive for source -Lactic acidosis trending down continue to monitor -Recent C. difficile infection (treated): Currently asymptomatic for  C. difficile infection. DC precautions -Blood and urine cultures pending   NSTEMI -Currently no chest pain however patient extremely fatigued  -Initial EKG with ST depressions concerning for global ischemia -TIMI risk score= 5 -Troponin significantly elevated but trending down continue to monitor Recent Labs  Lab 09/30/17 1228 09/30/17 1519 09/30/17 2125 10/01/17 0302 10/01/17 0807  TROPONINI 14.62* 13.44* 12.87* 12.00* 9.10*  -cardiology recommendations pending -ASA 325 mg daily -Lipitor 80 mg daily  lipid panel pending -continue Heparin drip -Metoprolol  12.5 mg BID -Echocardiogram GEXBMWU@@@@. Does not appear to have been ordered upon admission. -Cardiac catheterization? Await findings of echocardiogram.   Diabetes type 2 uncontrolled with complication@@@ -1/[id number] A1c= 7.1 -Lipid panel pending   Essential HTN  -unsure of patient's compliance at home with medication -See NSTEMI    Dementia  -Must be early onset able answer all questions appropriately -Continue Aricept, Abilify, Lamictal, and Trazodone   OSA -Not on home CPAP -CPAP per respiratory    DVT prophylaxis: Heparin drip Code Status: Full Family Communication: None Disposition Plan: TBD   Consultants:  Cardiology    Procedures/Significant Events:     I have personally reviewed and interpreted all radiology studies and my findings are as above.  VENTILATOR SETTINGS:    Cultures   Antimicrobials: Anti-infectives (From admission, onward)   Start     Stop   10/01/17 0800  levofloxacin (LEVAQUIN) IVPB 750 mg         09/30/17 2200  vancomycin (VANCOCIN) IVPB 750 mg/150 ml premix         09/30/17 1600  aztreonam (AZACTAM) 1 g in dextrose 5 % 50 mL IVPB         09/30/17 0715  levofloxacin (LEVAQUIN) IVPB 750 mg     09/30/17 1059   09/30/17 0715  aztreonam (AZACTAM) 2 g in dextrose 5 % 50 mL IVPB     09/30/17 0900   09/30/17 0715  vancomycin (VANCOCIN) IVPB 1000 mg/200 mL premix  Status:  Discontinued     09/30/17 0706   09/30/17 0715  vancomycin (VANCOCIN) 2,000 mg in sodium chloride 0.9 % 500 mL IVPB     09/30/17 1530       Devices    LINES / TUBES:      Continuous Infusions: . sodium chloride    . aztreonam Stopped (10/01/17 0035)  . heparin 1,250 Units/hr (10/01/17 0711)  . lactated ringers 100 mL/hr at 09/30/17 2242  . levofloxacin (LEVAQUIN) IV    . vancomycin 750 mg (09/30/17 2238)     Objective: Vitals:   09/30/17 1830 09/30/17 1900 09/30/17 1930 09/30/17 2046  BP: 117/74 110/70 117/65 114/75  Pulse: 81 81 82 82    Resp: (!) 30 (!) 27 (!) 21 (!) 22  Temp:    97.7 F (36.5 C)  TempSrc:    Oral  SpO2: 95% 96% 97% 97%  Weight:    198 lb 10.2 oz (90.1 kg)  Height:    5' 7"  (1.702 m)    Intake/Output Summary (Last 24 hours) at 10/01/2017 0826 Last data filed at 10/01/2017 0600 Gross per 24 hour  Intake 2628.25 ml  Output 300 ml  Net 2328.25 ml   Filed Weights   09/30/17 0700 09/30/17 2046  Weight: 199 lb 15.3 oz (90.7 kg) 198 lb 10.2 oz (90.1 kg)    Examination:  General: A/O 4, No acute respiratory distress, extremely weak Neck:  Negative scars, masses, torticollis, lymphadenopathy, JVD Lungs: tachypnea Clear to auscultation bilaterally without wheezes  or crackles Cardiovascular: Tachycardic Regular rhythm without murmur gallop or rub normal S1 and S2 Abdomen: negative abdominal pain, nondistended, positive soft, bowel sounds, no rebound, no ascites, no appreciable mass Extremities: No significant cyanosis, clubbing, or edema bilateral lower extremities Skin: Negative rashes, lesions, ulcers Psychiatric:  Negative depression, negative anxiety, negative fatigue, negative mania  Central nervous system:  Cranial nerves II through XII intact, tongue/uvula midline, all extremities muscle strength 5/5, sensation intact throughout,  negative dysarthria, negative expressive aphasia, negative receptive aphasia.  .     Data Reviewed: Care during the described time interval was provided by me .  I have reviewed this patient's available data, including medical history, events of note, physical examination, and all test results as part of my evaluation.   CBC: Recent Labs  Lab 09/30/17 0654 10/01/17 0302  WBC 15.5* 14.0*  NEUTROABS 12.8*  --   HGB 10.9* 11.4*  HCT 34.5* 36.8*  MCV 92.2 93.4  PLT 229 209   Basic Metabolic Panel: Recent Labs  Lab 09/30/17 0654 09/30/17 1228 10/01/17 0302  NA 140  --  138  K 4.8  --  4.6  CL 103  --  104  CO2 23  --  22  GLUCOSE 171*  --  155*  BUN 38*   --  33*  CREATININE 1.32*  --  1.10  CALCIUM 9.2  --  8.4*  MG  --  2.3  --    GFR: Estimated Creatinine Clearance: 64 mL/min (by C-G formula based on SCr of 1.1 mg/dL). Liver Function Tests: Recent Labs  Lab 09/30/17 0654  AST 143*  ALT 81*  ALKPHOS 82  BILITOT 0.5  PROT 6.9  ALBUMIN 3.4*   No results for input(s): LIPASE, AMYLASE in the last 168 hours. No results for input(s): AMMONIA in the last 168 hours. Coagulation Profile: Recent Labs  Lab 09/30/17 0818 10/01/17 0302  INR 1.16 1.44   Cardiac Enzymes: Recent Labs  Lab 09/30/17 1228 09/30/17 1519 09/30/17 2125 10/01/17 0302  TROPONINI 14.62* 13.44* 12.87* 12.00*   BNP (last 3 results) No results for input(s): PROBNP in the last 8760 hours. HbA1C: Recent Labs    09/30/17 2125  HGBA1C 7.1*   CBG: Recent Labs  Lab 09/30/17 0714 09/30/17 2155 10/01/17 0604  GLUCAP 151* 161* 165*   Lipid Profile: Recent Labs    10/01/17 0302  CHOL 170  HDL 54  LDLCALC 85  TRIG 153*  CHOLHDL 3.1   Thyroid Function Tests: Recent Labs    09/30/17 1228  TSH 1.239  FREET4 0.80   Anemia Panel: No results for input(s): VITAMINB12, FOLATE, FERRITIN, TIBC, IRON, RETICCTPCT in the last 72 hours. Urine analysis:    Component Value Date/Time   COLORURINE YELLOW 09/30/2017 [place] 09/30/2017 1255   LABSPEC >1.046 (H) 09/30/2017 1255   PHURINE 5.0 09/30/2017 1255   GLUCOSEU NEGATIVE 09/30/2017 1255   HGBUR MODERATE (A) 09/30/2017 1255   BILIRUBINUR NEGATIVE 09/30/2017 1255   BILIRUBINUR neg 04/27/2014 1205   [place] 09/30/2017 1255   PROTEINUR 30 (A) 09/30/2017 1255   UROBILINOGEN 0.2 03/07/2015 1005   NITRITE NEGATIVE 09/30/2017 1255   LEUKOCYTESUR NEGATIVE 09/30/2017 1255   Sepsis Labs: @LABRCNTIP (procalcitonin:4,lacticidven:4)  ) Recent Results (from the past 240 hour(s))  Culture, blood (routine x 2)     Status: None (Preliminary result)   Collection Time: 09/30/17  7:27  AM  Result Value Ref Range Status   Specimen Description   Final  BLOOD RIGHT WRIST Performed at [facility], [place] [address] [place], [place] [address]    Special Requests   Final    BOTTLES DRAWN AEROBIC AND ANAEROBIC Blood Culture adequate volume   Culture PENDING  Incomplete   Report Status PENDING  Incomplete  Respiratory Panel by PCR     Status: None   Collection Time: 09/30/17  8:54 AM  Result Value Ref Range Status   Adenovirus NOT DETECTED NOT DETECTED Final   Coronavirus 229E NOT DETECTED NOT DETECTED Final   Coronavirus HKU1 NOT DETECTED NOT DETECTED Final   Coronavirus NL63 NOT DETECTED NOT DETECTED Final   Coronavirus OC43 NOT DETECTED NOT DETECTED Final   Metapneumovirus NOT DETECTED NOT DETECTED Final   Rhinovirus / Enterovirus NOT DETECTED NOT DETECTED Final   Influenza A NOT DETECTED NOT DETECTED Final   Influenza B NOT DETECTED NOT DETECTED Final   Parainfluenza Virus 1 NOT DETECTED NOT DETECTED Final   Parainfluenza Virus 2 NOT DETECTED NOT DETECTED Final   Parainfluenza Virus 3 NOT DETECTED NOT DETECTED Final   Parainfluenza Virus 4 NOT DETECTED NOT DETECTED Final   Respiratory Syncytial Virus NOT DETECTED NOT DETECTED Final   Bordetella pertussis NOT DETECTED NOT DETECTED Final   Chlamydophila pneumoniae NOT DETECTED NOT DETECTED Final   Mycoplasma pneumoniae NOT DETECTED NOT DETECTED Final    Comment: Performed at [facility], [place] [address], [place], [place] [address]  MRSA PCR Screening     Status: None   Collection Time: 09/30/17  9:11 PM  Result Value Ref Range Status   MRSA by PCR NEGATIVE NEGATIVE Final    Comment:        The GeneXpert MRSA Assay (FDA approved for NASAL specimens only), is one component of a comprehensive MRSA colonization surveillance program. It is not intended to diagnose MRSA infection nor to guide or monitor treatment for MRSA infections. Performed at [facility], [place] [address] [place], [place]  [address]          Radiology Studies: Ct Head Wo Contrast  Result Date: 09/30/2017 CLINICAL DATA:  Altered mental status. Weakness. Prior right frontal infarct. EXAM: CT HEAD WITHOUT CONTRAST TECHNIQUE: Contiguous axial images were obtained from the base of the skull through the vertex without intravenous contrast. COMPARISON:  April 15, 2017 FINDINGS: Brain: Mild diffuse atrophy is stable. There is a cavum septum pellucidum, an anatomic variant. There is no intracranial mass, hemorrhage, extra-axial fluid collection, or midline shift. There is evidence of a prior infarct in the right frontal lobe with extension into the right centrum semiovale superiorly, stable. There is evidence of a prior small infarct in the medial left occipital lobe posteriorly. There is evidence of prior infarct in the superior left caudate nucleus head. There is small vessel disease in both anterior limbs of the internal capsules. There is periventricular small vessel disease in the centra semiovale in this area on both sides. There is small vessel disease in each thalamus. No acute infarct is demonstrable on this study. Vascular: No appreciable hyperdense vessel. There is calcification in each carotid siphon region as well as in each distal vertebral artery. Skull: Bony calvarium appears intact. Sinuses/Orbits: There is mucosal thickening in several ethmoid air cells. Other paranasal sinuses are clear. Orbits appear symmetric bilaterally except for previous cataract removal on the left. Other: Mastoid air cells are clear. IMPRESSION: Atrophy with prior infarcts and areas of small vessel disease, essentially stable. Largest prior infarct involves the right  frontal lobe. No acute infarct evident. No mass or hemorrhage. Foci of arterial vascular calcification noted. Mucosal thickening noted in several ethmoid air cells. Electronically Signed   By: Lowella Grip III M.D.   On: 09/30/2017 12:06   Dg Chest Portable 1 View  Result  Date: 09/30/2017 CLINICAL DATA:  Fever EXAM: PORTABLE CHEST 1 VIEW COMPARISON:  08/26/2017 FINDINGS: Prior median sternotomy. Numerous leads and wires project over the chest. Patient rotated to the right. Midline trachea. Moderate cardiomegaly. Atherosclerosis in the transverse aorta. No pleural effusion or pneumothorax. Low lung volumes with resultant pulmonary interstitial prominence. Mild bibasilar atelectasis. No lobar consolidation. IMPRESSION: No evidence of pneumonia or explanation for fever. Cardiomegaly and low lung volumes.  No overt congestive failure. Aortic Atherosclerosis (ICD10-I70.0). Electronically Signed   By: Abigail Miyamoto M.D.   On: 09/30/2017 07:46   Ct Angio Chest/abd/pel For Dissection W And/or W/wo  Result Date: 09/30/2017 CLINICAL DATA:  Low back pain. Generalized weakness. Concern for aortic dissection EXAM: CT ANGIOGRAPHY CHEST, ABDOMEN AND PELVIS TECHNIQUE: Multidetector CT imaging through the chest, abdomen and pelvis was performed using the standard protocol during bolus administration of intravenous contrast. Multiplanar reconstructed images and MIPs were obtained and reviewed to evaluate the vascular anatomy. CONTRAST:  137m ISOVUE-370 IOPAMIDOL (ISOVUE-370) INJECTION 76% COMPARISON:  CT abdomen 08/27/2017 FINDINGS: CTA CHEST FINDINGS Cardiovascular: Non IV contrast imaging demonstrates no intramural hematoma. Intimal calcification aorta aorta and great vessels. Contrast enhanced series demonstrates no dissection of the ascending, transverse, or descending thoracic aorta. No aneurysm. No evidence acute pulmonary embolism. Post CABG anatomy. Mediastinum/Nodes: No axillary or supraclavicular adenopathy. No mediastinal adenopathy. No hilar adenopathy. Lungs/Pleura: No suspicious pulmonary nodule. Calcified pleural surface within LEFT RIGHT hemithorax Musculoskeletal: No aggressive osseous lesion. Review of the MIP images confirms the above findings. CTA ABDOMEN AND PELVIS FINDINGS  VASCULAR Aorta: Normal caliber without evidence of dissection. Celiac: Widely patent.  Mild ostial calcifications. SMA: Widely patent Renals: Patent single renal arteries with ostial calcification. Stenosis of the proximal RIGHT renal artery (image 76, series 10) with post stenotic dilatation and full reconstitution of the renal artery. IMA: Patent Inflow: Normal Veins: Normal Review of the MIP images confirms the above findings. NON-VASCULAR Hepatobiliary: No focal hepatic lesion. No biliary duct dilatation. Gallbladder is normal. Common bile duct is normal. Pancreas: Pancreas is normal. No ductal dilatation. No pancreatic inflammation. Spleen: Normal spleen Adrenals/urinary tract: Adrenal glands and kidneys are normal. The ureters and bladder normal. Stomach/Bowel: Stomach, small bowel, appendix, and cecum are normal. The colon and rectosigmoid colon are normal. Vascular/Lymphatic: Abdominal aorta is normal caliber. There is no retroperitoneal or periportal lymphadenopathy. No pelvic lymphadenopathy. Reproductive: Prostate normal. Penile reservoir within the RIGHT lower quadrant. No complicating features. Other: No free fluid. Musculoskeletal: Posterior lumbar fusion Review of the MIP images confirms the above findings. IMPRESSION: Chest Impression: 1. No evidence of thoracic aneurysm or dissection. 2. Pleural calcification in the LEFTand RIGHT lung. 3. Coronary artery calcification and aortic atherosclerotic calcification. Abdomen / Pelvis Impression: 1. No evidence of abdominal aortic aneurysm or dissection. 2. Scattered intimal calcification of the aorta. 3. No acute abdominopelvic findings. Electronically Signed   By: SSuzy BouchardM.D.   On: 09/30/2017 12:22        Scheduled Meds: . ARIPiprazole  5 mg Oral Daily  . aspirin  325 mg Oral Daily  . atorvastatin  80 mg Oral Daily  . donepezil  10 mg Oral Daily  . FLUoxetine  40 mg Oral Daily  . insulin  aspart  0-15 Units Subcutaneous TID WC  .  insulin aspart  0-5 Units Subcutaneous QHS  . lamoTRIgine  100 mg Oral BID  . mouth rinse  15 mL Mouth Rinse BID  . metoprolol tartrate  12.5 mg Oral BID  . potassium citrate  10 mEq Oral BID  . sodium chloride flush  3 mL Intravenous Q12H  . traZODone  100 mg Oral QHS   Continuous Infusions: . sodium chloride    . aztreonam Stopped (10/01/17 0035)  . heparin 1,250 Units/hr (10/01/17 0711)  . lactated ringers 100 mL/hr at 09/30/17 2242  . levofloxacin (LEVAQUIN) IV    . vancomycin 750 mg (09/30/17 2238)     LOS: 1 day    Time spent: 40 minutes    WOODS, Geraldo Docker, MD [facility] Pager [phone]   If 7PM-7AM, please contact night-coverage [email or web address] Password [facility] 10/01/2017, 8:26 AM

## 2017-10-01 NOTE — Progress Notes (Addendum)
[place] for heparin Indication: chest pain/ACS  Allergies  Allergen Reactions  . Cephalexin     Unknown  . Doxycycline     Unknown  . Keflex [Cephalexin]   . Lac Bovis Other (See Comments)    lactose intolerant  . Pentazocine Lactate     He passed out- he had a seizure.  This occurred around 2000  . Talwin [Pentazocine] Other (See Comments)    hallucinations  . Tape Other (See Comments)    Paper tape only please.  Freddi Starr [Tizanidine Hcl] Other (See Comments)    Lightheaded and dizzy    Patient Measurements: Height: 5' 7"  (170.2 cm) Weight: 198 lb 10.2 oz (90.1 kg) IBW/kg (Calculated) : 66.1 Heparin Dosing Weight: 83 kg  Vital Signs: Temp: 97.7 F (36.5 C) (02/05 2046) Temp Source: Oral (02/05 2046) BP: 114/75 (02/05 2046) Pulse Rate: 82 (02/05 2046)  Labs: Recent Labs    09/30/17 0654 09/30/17 0818  09/30/17 1519 09/30/17 1804 09/30/17 2125 10/01/17 0302  HGB 10.9*  --   --   --   --   --  11.4*  HCT 34.5*  --   --   --   --   --  36.8*  PLT 229  --   --   --   --   --  169  LABPROT  --  14.7  --   --   --   --  17.4*  INR  --  1.16  --   --   --   --  1.44  HEPARINUNFRC  --   --   --   --  0.19*  --  0.52  CREATININE 1.32*  --   --   --   --   --  1.10  TROPONINI  --   --    < > 13.44*  --  12.87* 12.00*   < > = values in this interval not displayed.    Estimated Creatinine Clearance: 64 mL/min (by C-G formula based on SCr of 1.1 mg/dL).  Assessment: CC/HPI: generalized weakness  PMH: depression, HTN, HLD, CAD s/p CABG, OSA, CVA, EtOH abuse, DM, GERD, aflutter, seizuers  Anticoag: none pta - currently on IV Heparin for ACS. HL this AM was therapeutic.   CV: trop 13.14>12  Heme/Onc: H&H low stable, Plt 229>169  Goal of Therapy:  Heparin level 0.3-0.7 units/ml Monitor platelets by anticoagulation protocol: Yes   Plan:  Continue IV Heparin at 1250 units/hr Monitor confirmatory HL this afternoon   Daily HL CBC   Albertina Parr, PharmD., BCPS Clinical Pharmacist Phone [phone]  Addendum: repeat HL remains therapeutic at 0.52. Will continue heparin at current rate and monitor daily HL's.   Albertina Parr, PharmD., BCPS Clinical Pharmacist Pager [phone]

## 2017-10-01 NOTE — Progress Notes (Signed)
Echocardiogram 2D Echocardiogram has been performed.  Johny Chess 10/01/2017, 5:53 PM

## 2017-10-02 DIAGNOSIS — I5023 Acute on chronic systolic (congestive) heart failure: Secondary | ICD-10-CM

## 2017-10-02 DIAGNOSIS — I272 Pulmonary hypertension, unspecified: Secondary | ICD-10-CM

## 2017-10-02 LAB — BASIC METABOLIC PANEL
Anion gap: 11 (ref 5–15)
BUN: 39 mg/dL — AB (ref 6–20)
CHLORIDE: 103 mmol/L (ref 101–111)
CO2: 22 mmol/L (ref 22–32)
CREATININE: 1.22 mg/dL (ref 0.61–1.24)
Calcium: 8 mg/dL — ABNORMAL LOW (ref 8.9–10.3)
GFR calc Af Amer: >60 mL/min (ref >60)
GFR calc non Af Amer: 57 mL/min — ABNORMAL LOW (ref >60)
Glucose, Bld: 132 mg/dL — ABNORMAL HIGH (ref 65–99)
Potassium: 4.2 mmol/L (ref 3.5–5.1)
Sodium: 136 mmol/L (ref 135–145)

## 2017-10-02 LAB — CBC
HCT: 33.4 % — ABNORMAL LOW (ref 39.0–52.0)
HEMOGLOBIN: 10.3 g/dL — AB (ref 13.0–17.0)
MCH: 28.5 pg (ref 26.0–34.0)
MCHC: 30.8 g/dL (ref 30.0–36.0)
MCV: 92.5 fL (ref 78.0–100.0)
Platelets: 152 10*3/uL (ref 150–400)
RBC: 3.61 MIL/uL — ABNORMAL LOW (ref 4.22–5.81)
RDW: 15.5 % (ref 11.5–15.5)
WBC: 13.9 10*3/uL — ABNORMAL HIGH (ref 4.0–10.5)

## 2017-10-02 LAB — LACTIC ACID, PLASMA
Lactic Acid, Venous: 2.6 mmol/L (ref 0.5–1.9)
Lactic Acid, Venous: 2.5 mmol/L (ref 0.5–1.9)
Lactic Acid, Venous: 2.6 mmol/L (ref 0.5–1.9)

## 2017-10-02 LAB — GLUCOSE, CAPILLARY
Glucose-Capillary: 105 mg/dL — ABNORMAL HIGH (ref 65–99)
Glucose-Capillary: 152 mg/dL — ABNORMAL HIGH (ref 65–99)
Glucose-Capillary: 126 mg/dL — ABNORMAL HIGH (ref 65–99)

## 2017-10-02 LAB — HEPARIN LEVEL (UNFRACTIONATED): Heparin Unfractionated: 0.47 [IU]/mL (ref 0.30–0.70)

## 2017-10-02 LAB — MAGNESIUM: Magnesium: 2.2 mg/dL (ref 1.7–2.4)

## 2017-10-02 LAB — TROPONIN I: TROPONIN I: 5.04 ng/mL — AB (ref <0.03)

## 2017-10-02 MED ORDER — SODIUM CHLORIDE 0.9 % IV BOLUS (SEPSIS)
1000.0000 mL | Freq: Once | INTRAVENOUS | Status: AC
  Administered 2017-10-02: 1000 mL via INTRAVENOUS

## 2017-10-02 MED ORDER — METOPROLOL SUCCINATE ER 25 MG PO TB24: 25.0000 mg | ORAL_TABLET | Freq: Every day | ORAL | Status: DC

## 2017-10-02 MED ORDER — SODIUM CHLORIDE 0.9 % IV BOLUS (SEPSIS)
500.0000 mL | Freq: Once | INTRAVENOUS | Status: AC
  Administered 2017-10-02: 500 mL via INTRAVENOUS

## 2017-10-02 MED ORDER — METOPROLOL TARTRATE 12.5 MG HALF TABLET
12.5000 mg | ORAL_TABLET | Freq: Two times a day (BID) | ORAL | Status: DC
  Administered 2017-10-02 – 2017-10-04 (×4): 12.5 mg via ORAL
  Filled 2017-10-02 (×4): qty 1

## 2017-10-02 MED ORDER — SODIUM CHLORIDE 0.9 % IV SOLN
INTRAVENOUS | Status: DC
  Administered 2017-10-02: 19:00:00 via INTRAVENOUS
  Administered 2017-10-04: 35 mL/h via INTRAVENOUS

## 2017-10-02 NOTE — Progress Notes (Signed)
Per patient's wife, patient sleeps with BIPAP at night. Order for CPAP currently in place. Dr. Sherral Hammers notified.  Will continue to monitor. Roselyn Reef Jaydalee Bardwell,RN

## 2017-10-02 NOTE — Progress Notes (Signed)
Progress Note  Patient Name: [name] Date of Encounter: 10/02/2017  Primary Cardiologist: Dr. Martinique  Subjective   Still weak, but feels better this morning.   Inpatient Medications    Scheduled Meds: . ARIPiprazole  5 mg Oral Daily  . aspirin  325 mg Oral Daily  . atorvastatin  80 mg Oral Daily  . donepezil  10 mg Oral Daily  . FLUoxetine  40 mg Oral Daily  . insulin aspart  0-15 Units Subcutaneous TID WC  . insulin aspart  0-5 Units Subcutaneous QHS  . lamoTRIgine  100 mg Oral BID  . mouth rinse  15 mL Mouth Rinse BID  . metoprolol tartrate  12.5 mg Oral BID  . potassium citrate  10 mEq Oral BID  . sodium chloride flush  3 mL Intravenous Q12H  . traZODone  100 mg Oral QHS   Continuous Infusions: . sodium chloride    . aztreonam 1 g (10/02/17 0905)  . heparin 1,250 Units/hr (10/02/17 0053)  . lactated ringers 100 mL/hr at 10/02/17 0914  . levofloxacin (LEVAQUIN) IV 750 mg (10/02/17 0904)  . sodium chloride    . vancomycin 750 mg (10/02/17 0905)   PRN Meds: sodium chloride, acetaminophen, fluticasone, nitroGLYCERIN, ondansetron (ZOFRAN) IV, sodium chloride flush   Vital Signs    Vitals:   10/01/17 1457 10/01/17 1707 10/01/17 2029 10/02/17 0607  BP: 108/70 112/80 135/77 109/68  Pulse: 71  69 63  Resp: 19 19 17 20   Temp:  97.8 F (36.6 C) 98.7 F (37.1 C) (!) 97.5 F (36.4 C)  TempSrc:  Oral Oral Oral  SpO2: 95% 96% 92% 95%  Weight:    210 lb 8.6 oz (95.5 kg)  Height:        Intake/Output Summary (Last 24 hours) at 10/02/2017 1037 Last data filed at 10/02/2017 0646 Gross per 24 hour  Intake -  Output 750 ml  Net -750 ml   Filed Weights   09/30/17 0700 09/30/17 2046 10/02/17 9675  Weight: 199 lb 15.3 oz (90.7 kg) 198 lb 10.2 oz (90.1 kg) 210 lb 8.6 oz (95.5 kg)    Telemetry    SR - Personally Reviewed  Physical Exam   General: Pale older W male appearing in no acute distress. Slow to respond to questions.  Head: Normocephalic,  atraumatic.  Neck: Supple without bruits, JVD. Lungs:  Resp regular and unlabored, CTA. Heart: RRR, S1, S2, no S3, S4, or murmur; no rub. Abdomen: Soft, non-tender, non-distended with normoactive bowel sounds.  Extremities: No clubbing, cyanosis, mild LE edema. Distal pedal pulses are 2+ bilaterally. Neuro: Alert and oriented X 3. Moves all extremities spontaneously. Psych: Normal affect.  Labs    Chemistry Recent Labs  Lab 09/30/17 0654 10/01/17 0302 10/02/17 0305  NA 140 138 136  K 4.8 4.6 4.2  CL 103 104 103  CO2 23 22 22   GLUCOSE 171* 155* 132*  BUN 38* 33* 39*  CREATININE 1.32* 1.10 1.22  CALCIUM 9.2 8.4* 8.0*  PROT 6.9  --   --   ALBUMIN 3.4*  --   --   AST 143*  --   --   ALT 81*  --   --   ALKPHOS 82  --   --   BILITOT 0.5  --   --   GFRNONAA 52* >60 57*  GFRAA >60 >60 >60  ANIONGAP 14 12 11      Hematology Recent Labs  Lab 09/30/17 9163 10/01/17 0302 10/02/17 8466  WBC 15.5* 14.0* 13.9*  RBC 3.74* 3.94* 3.61*  HGB 10.9* 11.4* 10.3*  HCT 34.5* 36.8* 33.4*  MCV 92.2 93.4 92.5  MCH 29.1 28.9 28.5  MCHC 31.6 31.0 30.8  RDW 15.9* 15.9* 15.5  PLT 229 169 152    Cardiac Enzymes Recent Labs  Lab 09/30/17 1519 09/30/17 2125 10/01/17 0302 10/01/17 0807  TROPONINI 13.44* 12.87* 12.00* 9.10*    Recent Labs  Lab 09/30/17 0700  TROPIPOC 13.14*     BNPNo results for input(s): BNP, PROBNP in the last 168 hours.   DDimer No results for input(s): DDIMER in the last 168 hours.    Radiology    Dg Chest 2 View  Result Date: 10/01/2017 CLINICAL DATA:  Generalized weakness, diarrhea EXAM: CHEST  2 VIEW COMPARISON:  CT chest 09/30/2017 and chest x-ray of the same date FINDINGS: The lungs appear better aerated. Cardiomegaly is stable. Mediastinal and hilar contours are unchanged. Median sternotomy sutures again are noted. Implanted loop recorder is again noted. IMPRESSION: Improved aeration.  Stable cardiomegaly. Electronically Signed   By: Ivar Drape M.D.    On: 10/01/2017 09:54   Ct Head Wo Contrast  Result Date: 09/30/2017 CLINICAL DATA:  Altered mental status. Weakness. Prior right frontal infarct. EXAM: CT HEAD WITHOUT CONTRAST TECHNIQUE: Contiguous axial images were obtained from the base of the skull through the vertex without intravenous contrast. COMPARISON:  April 15, 2017 FINDINGS: Brain: Mild diffuse atrophy is stable. There is a cavum septum pellucidum, an anatomic variant. There is no intracranial mass, hemorrhage, extra-axial fluid collection, or midline shift. There is evidence of a prior infarct in the right frontal lobe with extension into the right centrum semiovale superiorly, stable. There is evidence of a prior small infarct in the medial left occipital lobe posteriorly. There is evidence of prior infarct in the superior left caudate nucleus head. There is small vessel disease in both anterior limbs of the internal capsules. There is periventricular small vessel disease in the centra semiovale in this area on both sides. There is small vessel disease in each thalamus. No acute infarct is demonstrable on this study. Vascular: No appreciable hyperdense vessel. There is calcification in each carotid siphon region as well as in each distal vertebral artery. Skull: Bony calvarium appears intact. Sinuses/Orbits: There is mucosal thickening in several ethmoid air cells. Other paranasal sinuses are clear. Orbits appear symmetric bilaterally except for previous cataract removal on the left. Other: Mastoid air cells are clear. IMPRESSION: Atrophy with prior infarcts and areas of small vessel disease, essentially stable. Largest prior infarct involves the right frontal lobe. No acute infarct evident. No mass or hemorrhage. Foci of arterial vascular calcification noted. Mucosal thickening noted in several ethmoid air cells. Electronically Signed   By: Lowella Grip III M.D.   On: 09/30/2017 12:06   Ct Angio Chest/abd/pel For Dissection W And/or  W/wo  Result Date: 09/30/2017 CLINICAL DATA:  Low back pain. Generalized weakness. Concern for aortic dissection EXAM: CT ANGIOGRAPHY CHEST, ABDOMEN AND PELVIS TECHNIQUE: Multidetector CT imaging through the chest, abdomen and pelvis was performed using the standard protocol during bolus administration of intravenous contrast. Multiplanar reconstructed images and MIPs were obtained and reviewed to evaluate the vascular anatomy. CONTRAST:  152m ISOVUE-370 IOPAMIDOL (ISOVUE-370) INJECTION 76% COMPARISON:  CT abdomen 08/27/2017 FINDINGS: CTA CHEST FINDINGS Cardiovascular: Non IV contrast imaging demonstrates no intramural hematoma. Intimal calcification aorta aorta and great vessels. Contrast enhanced series demonstrates no dissection of the ascending, transverse, or descending thoracic aorta. No aneurysm.  No evidence acute pulmonary embolism. Post CABG anatomy. Mediastinum/Nodes: No axillary or supraclavicular adenopathy. No mediastinal adenopathy. No hilar adenopathy. Lungs/Pleura: No suspicious pulmonary nodule. Calcified pleural surface within LEFT RIGHT hemithorax Musculoskeletal: No aggressive osseous lesion. Review of the MIP images confirms the above findings. CTA ABDOMEN AND PELVIS FINDINGS VASCULAR Aorta: Normal caliber without evidence of dissection. Celiac: Widely patent.  Mild ostial calcifications. SMA: Widely patent Renals: Patent single renal arteries with ostial calcification. Stenosis of the proximal RIGHT renal artery (image 76, series 10) with post stenotic dilatation and full reconstitution of the renal artery. IMA: Patent Inflow: Normal Veins: Normal Review of the MIP images confirms the above findings. NON-VASCULAR Hepatobiliary: No focal hepatic lesion. No biliary duct dilatation. Gallbladder is normal. Common bile duct is normal. Pancreas: Pancreas is normal. No ductal dilatation. No pancreatic inflammation. Spleen: Normal spleen Adrenals/urinary tract: Adrenal glands and kidneys are normal.  The ureters and bladder normal. Stomach/Bowel: Stomach, small bowel, appendix, and cecum are normal. The colon and rectosigmoid colon are normal. Vascular/Lymphatic: Abdominal aorta is normal caliber. There is no retroperitoneal or periportal lymphadenopathy. No pelvic lymphadenopathy. Reproductive: Prostate normal. Penile reservoir within the RIGHT lower quadrant. No complicating features. Other: No free fluid. Musculoskeletal: Posterior lumbar fusion Review of the MIP images confirms the above findings. IMPRESSION: Chest Impression: 1. No evidence of thoracic aneurysm or dissection. 2. Pleural calcification in the LEFTand RIGHT lung. 3. Coronary artery calcification and aortic atherosclerotic calcification. Abdomen / Pelvis Impression: 1. No evidence of abdominal aortic aneurysm or dissection. 2. Scattered intimal calcification of the aorta. 3. No acute abdominopelvic findings. Electronically Signed   By: Suzy Bouchard M.D.   On: 09/30/2017 12:22    Cardiac Studies   TTE: 10/01/17  Study Conclusions  - Left ventricle: The cavity size was normal. Wall thickness was   increased in a pattern of mild LVH. Systolic function was   severely reduced. The estimated ejection fraction was in the   range of 20% to 25%. - Aortic valve: Moderately calcified annulus. Moderately thickened,   moderately calcified leaflets. There was trivial regurgitation. - Mitral valve: There was mild to moderate regurgitation. - Tricuspid valve: There was moderate regurgitation. - Pulmonary arteries: Systolic pressure was moderately increased.   PA peak pressure: 42 mm Hg (S).  Patient Profile     72 y.o. male with a history of CAD with CABG in 2005, HTN, HLD, PE, OSA on CPAP, chronic diastolic HF, Hx cryptogenic stoke with loop recorder- now no longer working 72 yrs old, on Eliquis, and chronic home 02 for for possible asbestos toxicity, now presenting with severe weakness and troponin was 13 on admission.  Assessment &  Plan    1. NSTEMI: continues to deny any chest pain. Peaked at 14.62 and now trending down. Has been on IV heparin for 48hrs, but limited echo yesterday noted significant decline in EF. Will need ischemic evaluation, but did believe he is ready from a respiratory stand point.  -- will continue to hold eliquis and use IV heparin for now -- switch metoprolol to Toprol XL. Blood pressures soft at times therefore will hold on adding ARB today.   2. Acute systolic HF: Noted on echo 2/6. Does not have signs of acute volume overload on exam. No indication for diuretic at this time.   3. Sepsis, source unknown: Labs improving. Work up per primary  4. CAD w/ Hx of CABG: With decline in EF will need cath with stable.   Signed, Reino Bellis, NP  10/02/2017, 10:37 AM  Pager # [phone]   I have examined the patient and reviewed assessment and plan and discussed with patient.  Agree with above as stated.  Stil feels weak.  Will have to consider cath in the future when more stable from a medical standopint.  Avoid excessive IV fluids given lower EF.  Will need to intensify CHF therapy when BP allows.  Larae Grooms   For questions or updates, please contact [facility] Please consult [email or web address] for contact info under Cardiology/STEMI.

## 2017-10-02 NOTE — Progress Notes (Signed)
CRITICAL VALUE ALERT  Critical Value:  Lactic Acid 2.5  Date & Time Notied:  10/02/17 1353  Provider Notified: Dr. Sherral Hammers (text paged; Shea Evans)  Orders Received/Actions taken: awaiting further orders  - Roselyn Reef Dariel Betzer,RN

## 2017-10-02 NOTE — Progress Notes (Signed)
[place] for heparin Indication: chest pain/ACS  Allergies  Allergen Reactions  . Cephalexin     Unknown  . Doxycycline     Unknown  . Keflex [Cephalexin]   . Lac Bovis Other (See Comments)    lactose intolerant  . Pentazocine Lactate     He passed out- he had a seizure.  This occurred around 2000  . Talwin [Pentazocine] Other (See Comments)    hallucinations  . Tape Other (See Comments)    Paper tape only please.  [name] [Tizanidine Hcl] Other (See Comments)    Lightheaded and dizzy    Patient Measurements: Height: 5' 7"  (170.2 cm) Weight: 210 lb 8.6 oz (95.5 kg) IBW/kg (Calculated) : 66.1 Heparin Dosing Weight: 83 kg  Vital Signs: Temp: 97.5 F (36.4 C) (02/07 0607) Temp Source: Oral (02/07 0607) BP: 109/68 (02/07 0607) Pulse Rate: 63 (02/07 0607)  Labs: Recent Labs    09/30/17 0654 09/30/17 0818  09/30/17 2125 10/01/17 0302 10/01/17 0807 10/01/17 1110 10/02/17 0305  HGB 10.9*  --   --   --  11.4*  --   --  10.3*  HCT 34.5*  --   --   --  36.8*  --   --  33.4*  PLT 229  --   --   --  169  --   --  152  LABPROT  --  14.7  --   --  17.4*  --   --   --   INR  --  1.16  --   --  1.44  --   --   --   HEPARINUNFRC  --   --    < >  --  0.52  --  0.52 0.47  CREATININE 1.32*  --   --   --  1.10  --   --  1.22  TROPONINI  --   --    < > 12.87* 12.00* 9.10*  --   --    < > = values in this interval not displayed.    Estimated Creatinine Clearance: 59.4 mL/min (by C-G formula based on SCr of 1.22 mg/dL).  Assessment: CC/HPI: generalized weakness  PMH: depression, HTN, HLD, CAD s/p CABG, OSA, CVA, EtOH abuse, DM, GERD, aflutter, seizuers  Anticoag: none pta - currently on IV Heparin for ACS. HL this AM was therapeutic at 0.47  CV: trop 13.14>12>9.1  Heme/Onc: H&H low stable, Plt [phone]  Goal of Therapy:  Heparin level 0.3-0.7 units/ml Monitor platelets by anticoagulation protocol: Yes   Plan:  Continue IV  Heparin at 1250 units/hr Daily HL & CBC 1100 today will complete 48 hours of IV heparin. F/u duration per cardiology    Albertina Parr, PharmD., BCPS Clinical Pharmacist Phone [phone]

## 2017-10-02 NOTE — Progress Notes (Signed)
CRITICAL VALUE ALERT  Critical Value:  Lactic acid 2.6  Date & Time Notied:  10/02/17  @ 3818  Provider Notified: Kennon Holter

## 2017-10-02 NOTE — Progress Notes (Signed)
PROGRESS NOTE    [name]  ZOX:[id number] DOB: May 16, 1946 DOA: 09/30/2017 PCP: Carleene Cooper, MD   Brief Narrative:  72 yo WM PMHx  seizures, depression,CVA; OSA not on CPAP; seizures; HTN; HLD; Dementia;  ETOH abuse; DM(diet-controlled); CAD s/p CABG 2005; chronic diastolic CHF 2014, atrial flutter, AKI, Diverticulitis  s/p partial colectomy, hiatal hernia, PE 2005, nephrolithiasis 04/15/2014  Presenting with weakness.  Initially, the patient provided the history and said "I don't know" what brought him in.  He started feeling bad 2-3 years ago.  Started feeling like he does today 2-3 weeks ago.  Can't eat.  No SOB.  Occasional cough, not new, productive of clear sputum.  No fever at home.  +headache x 2-3 years, confusion x 2-3 months.  Hurts left lower back.  No chest pain, palpitations.  No abdominal pain.  No N/V/D/C.   No rash.  +weakness.  Occasional dysphagia.   Based on minimal meaningful history provided by the patient, I called and spoke to his wife by telephone.  He was previously admitted here from 1/1-7 with C. Diff colitis and discharged on oral vancomycin; he has troponin elevation thought to be related to demand ischemia and also had a BIPAP requirement qhs, possibly due to diastolic heart failure.  He left and was sent to [facility] and then he was transferred to the [place] in [place] for rehab.  His wife brought him home a week ago and he was negative for C diff prior to discharge.  2 days ago he started walking slower and leaning to his left.  Last night, he was unable to stand for his shower.  He got out of the bed overnight and fell onto his buttocks.  His wife was able to get him up and back in bed but then it happened again.  Her grandson was able to pick him up and they placed him in a lift recliner.  He was "confused with his feet."  Prior CVA with left-sided weakness but now with apparent left-sided weakness.  He has been turning his feet inward but he did not understand what  she was saying.  He did not have a fever at home until just as she called EMS.  He is intermittently confused at home.  Yesterday, he was breathing in a pattern that was bothersome to the family - "he sucks air in through his mouth and swallows it and has to burp or pass gas and he did that all day long yesterday."  No cough until today.  ?productive sputum.  Urine with an odor today.  +diarrhea but this is chronic after colonic resection; this has been better than when he had C diff.       ED Course: Sepsis - started on Vanc/Levaquin/Aztreonam.  Uncertain source - flu negative.  CXR and CTA negative.  Urine unremarkable.  Cardiology consulted - no cath while floridly septic, start Heparin, they will follow.    Subjective: 2/7 A/O 2 (does not know where, when). Did not he was in the hospital. Negative CP, negative SOB, negative abdominal pain. Continued positive fatigue    Assessment & Plan:   Principal Problem:   Sepsis (HCC) Active Problems:   Diabetes mellitus (HCC)   Obstructive sleep apnea   Essential hypertension   NSTEMI (non-ST elevated myocardial infarction) (HCC)   Dementia   Sepsis unspecified organism  -Continue current antibiotics: Leukocytosis and lactic acidosis improving -Panculture pending: Currently no source -Repeat PCXR inconclusive for source -Recent C. difficile  infection (treated): Currently asymptomatic for C. difficile infection. DC precautions -Blood and urine cultures pending -2/7 lactic acid trending up: Bolus normal saline 500 ml. Repeat lactic acid. Watch carefully for fluid overload; repeat lactic acid 2.5 -->repeat Bolus normal saline x 1, then normal saline 35 ml/hr@@@    NSTEMI -Currently no chest pain however patient extremely fatigued  -Initial EKG with ST depressions concerning for global ischemia -TIMI risk score= 5 -Troponin significantly elevated but trending down continue to monitor Recent Labs  Lab 09/30/17 1228 09/30/17 1519  09/30/17 2125 10/01/17 0302 10/01/17 0807  TROPONINI 14.62* 13.44* 12.87* 12.00* 9.10*  -cardiology recommendations pending -ASA 325 mg daily -Lipitor 80 mg daily  lipid panel pending -continue Heparin drip -Metoprolol 12.5 mg BID -2/6 Echocardiogram; worsening EF when compared to echocardiogram from 08/29/2017 (EF 50-55%) now EF only 20-25%. See results below  -Cardiac catheterization? Await findings of echocardiogram.  Acute on chronic systolic QVZ@@@@ -Strict in and out -Daily weight -Metoprolol 12.5 mg BID  Pulmonary hypertension@@@@ -See CHF  Essential HTN  -unsure of patient's compliance at home with medication -See NSTEMI    Diabetes type 2 uncontrolled with complication -2/5Hemoglobin A1c= 7.1 -Lipid panel pending     Dementia  -Must be early onset able answer all questions appropriately -Continue Aricept, Abilify, Lamictal, and Trazodone   OSA -Not on home CPAP -CPAP per respiratory    DVT prophylaxis: Heparin drip Code Status: Full Family Communication: None Disposition Plan: TBD   Consultants:  Cardiology    Procedures/Significant Events:  2/6 Echocardiogram:- Left ventricle: - mild LVH. -LVEF = 20% to 25%. - Mitral valve: mild to moderate regurgitation. - Tricuspid valve:  moderate regurgitation. - Pulmonary arteries:  PA peak pressure: 42 mm Hg (S).    I have personally reviewed and interpreted all radiology studies and my findings are as above.  VENTILATOR SETTINGS:    Cultures   Antimicrobials: Anti-infectives (From admission, onward)   Start     Stop   10/01/17 0800  levofloxacin (LEVAQUIN) IVPB 750 mg         09/30/17 2200  vancomycin (VANCOCIN) IVPB 750 mg/150 ml premix         09/30/17 1600  aztreonam (AZACTAM) 1 g in dextrose 5 % 50 mL IVPB         09/30/17 0715  levofloxacin (LEVAQUIN) IVPB 750 mg     09/30/17 1059   09/30/17 0715  aztreonam (AZACTAM) 2 g in dextrose 5 % 50 mL IVPB     09/30/17 0900   09/30/17 0715   vancomycin (VANCOCIN) IVPB 1000 mg/200 mL premix  Status:  Discontinued     09/30/17 0706   09/30/17 0715  vancomycin (VANCOCIN) 2,000 mg in sodium chloride 0.9 % 500 mL IVPB     09/30/17 1530       Devices    LINES / TUBES:      Continuous Infusions: . sodium chloride    . aztreonam Stopped (10/02/17 0130)  . heparin 1,250 Units/hr (10/02/17 0053)  . lactated ringers 100 mL/hr at 10/01/17 2208  . levofloxacin (LEVAQUIN) IV Stopped (10/01/17 1014)  . vancomycin Stopped (10/01/17 2318)     Objective: Vitals:   10/01/17 1457 10/01/17 1707 10/01/17 2029 10/02/17 0607  BP: 108/70 112/80 135/77 109/68  Pulse: 71  69 63  Resp: 19 19 17 20   Temp:  97.8 F (36.6 C) 98.7 F (37.1 C) (!) 97.5 F (36.4 C)  TempSrc:  Oral Oral Oral  SpO2: 95% 96%  92% 95%  Weight:    210 lb 8.6 oz (95.5 kg)  Height:        Intake/Output Summary (Last 24 hours) at 10/02/2017 2536 Last data filed at 10/02/2017 6440 Gross per 24 hour  Intake -  Output 750 ml  Net -750 ml   Filed Weights   09/30/17 0700 09/30/17 2046 10/02/17 3474  Weight: 199 lb 15.3 oz (90.7 kg) 198 lb 10.2 oz (90.1 kg) 210 lb 8.6 oz (95.5 kg)    Physical Exam:  General:  A/O 2 (does not know where, when), does not wheeze in the hospital, No acute respiratory distress, extremely weak Neck:  Negative scars, masses, torticollis, lymphadenopathy, JVD Lungs: tachypnea Clear to auscultation bilaterally without wheezes or crackles Cardiovascular: Tachycardic Regular rhythm without murmur gallop or rub normal S1 and S2 Abdomen: negative abdominal pain, nondistended, positive soft, bowel sounds, no rebound, no ascites, no appreciable mass Extremities: No significant cyanosis, clubbing, or edema bilateral lower extremities Skin: Negative rashes, lesions, ulcers Psychiatric:  Negative depression, negative anxiety, negative fatigue, negative mania  Central nervous system:  Cranial nerves II through XII intact, tongue/uvula midline,  all extremities muscle strength 5/5, sensation intact throughout,  negative dysarthria, negative expressive aphasia, negative receptive aphasia  .     Data Reviewed: Care during the described time interval was provided by me .  I have reviewed this patient's available data, including medical history, events of note, physical examination, and all test results as part of my evaluation.   CBC: Recent Labs  Lab 09/30/17 0654 10/01/17 0302 10/02/17 0305  WBC 15.5* 14.0* 13.9*  NEUTROABS 12.8*  --   --   HGB 10.9* 11.4* 10.3*  HCT 34.5* 36.8* 33.4*  MCV 92.2 93.4 92.5  PLT 229 169 152   Basic Metabolic Panel: Recent Labs  Lab 09/30/17 0654 09/30/17 1228 10/01/17 0302 10/02/17 0305  NA 140  --  138 136  K 4.8  --  4.6 4.2  CL 103  --  104 103  CO2 23  --  22 22  GLUCOSE 171*  --  155* 132*  BUN 38*  --  33* 39*  CREATININE 1.32*  --  1.10 1.22  CALCIUM 9.2  --  8.4* 8.0*  MG  --  2.3  --  2.2   GFR: Estimated Creatinine Clearance: 59.4 mL/min (by C-G formula based on SCr of 1.22 mg/dL). Liver Function Tests: Recent Labs  Lab 09/30/17 0654  AST 143*  ALT 81*  ALKPHOS 82  BILITOT 0.5  PROT 6.9  ALBUMIN 3.4*   No results for input(s): LIPASE, AMYLASE in the last 168 hours. No results for input(s): AMMONIA in the last 168 hours. Coagulation Profile: Recent Labs  Lab 09/30/17 0818 10/01/17 0302  INR 1.16 1.44   Cardiac Enzymes: Recent Labs  Lab 09/30/17 1228 09/30/17 1519 09/30/17 2125 10/01/17 0302 10/01/17 0807  TROPONINI 14.62* 13.44* 12.87* 12.00* 9.10*   BNP (last 3 results) No results for input(s): PROBNP in the last 8760 hours. HbA1C: Recent Labs    09/30/17 2125  HGBA1C 7.1*   CBG: Recent Labs  Lab 10/01/17 0604 10/01/17 1205 10/01/17 1643 10/01/17 2129 10/02/17 0605  GLUCAP 165* 199* 168* 165* 105*   Lipid Profile: Recent Labs    10/01/17 0302  CHOL 170  HDL 54  LDLCALC 85  TRIG 153*  CHOLHDL 3.1   Thyroid Function  Tests: Recent Labs    09/30/17 1228  TSH 1.239  FREET4 0.80   Anemia  Panel: No results for input(s): VITAMINB12, FOLATE, FERRITIN, TIBC, IRON, RETICCTPCT in the last 72 hours. Urine analysis:    Component Value Date/Time   COLORURINE YELLOW 09/30/2017 1255   APPEARANCEUR CLEAR 09/30/2017 1255   LABSPEC >1.046 (H) 09/30/2017 1255   PHURINE 5.0 09/30/2017 1255   GLUCOSEU NEGATIVE 09/30/2017 1255   HGBUR MODERATE (A) 09/30/2017 1255   BILIRUBINUR NEGATIVE 09/30/2017 1255   BILIRUBINUR neg 04/27/2014 1205   KETONESUR NEGATIVE 09/30/2017 1255   PROTEINUR 30 (A) 09/30/2017 1255   UROBILINOGEN 0.2 03/07/2015 1005   NITRITE NEGATIVE 09/30/2017 1255   LEUKOCYTESUR NEGATIVE 09/30/2017 1255   Sepsis Labs: @LABRCNTIP (procalcitonin:4,lacticidven:4)  ) Recent Results (from the past 240 hour(s))  Culture, blood (routine x 2)     Status: None (Preliminary result)   Collection Time: 09/30/17  7:27 AM  Result Value Ref Range Status   Specimen Description BLOOD LEFT ANTECUBITAL  Final   Special Requests   Final    BOTTLES DRAWN AEROBIC AND ANAEROBIC Blood Culture adequate volume   Culture   Final    NO GROWTH 1 DAY Performed at [facility], [address] [place], [place] [address]    Report Status PENDING  Incomplete  Culture, blood (routine x 2)     Status: None (Preliminary result)   Collection Time: 09/30/17  7:27 AM  Result Value Ref Range Status   Specimen Description BLOOD RIGHT WRIST  Final   Special Requests   Final    BOTTLES DRAWN AEROBIC AND ANAEROBIC Blood Culture adequate volume   Culture   Final    NO GROWTH 1 DAY Performed at [facility], [address], [place], [place] [address]    Report Status PENDING  Incomplete  Respiratory Panel by PCR     Status: None   Collection Time: 09/30/17  8:54 AM  Result Value Ref Range Status   Adenovirus NOT DETECTED NOT DETECTED Final   Coronavirus 229E NOT DETECTED NOT DETECTED Final   Coronavirus HKU1 NOT  DETECTED NOT DETECTED Final   Coronavirus NL63 NOT DETECTED NOT DETECTED Final   Coronavirus OC43 NOT DETECTED NOT DETECTED Final   Metapneumovirus NOT DETECTED NOT DETECTED Final   Rhinovirus / Enterovirus NOT DETECTED NOT DETECTED Final   Influenza A NOT DETECTED NOT DETECTED Final   Influenza B NOT DETECTED NOT DETECTED Final   Parainfluenza Virus 1 NOT DETECTED NOT DETECTED Final   Parainfluenza Virus 2 NOT DETECTED NOT DETECTED Final   Parainfluenza Virus 3 NOT DETECTED NOT DETECTED Final   Parainfluenza Virus 4 NOT DETECTED NOT DETECTED Final   Respiratory Syncytial Virus NOT DETECTED NOT DETECTED Final   Bordetella pertussis NOT DETECTED NOT DETECTED Final   Chlamydophila pneumoniae NOT DETECTED NOT DETECTED Final   Mycoplasma pneumoniae NOT DETECTED NOT DETECTED Final    Comment: Performed at [facility], [address], [place], [place] [address]  Urine culture     Status: None   Collection Time: 09/30/17  1:00 PM  Result Value Ref Range Status   Specimen Description URINE, CLEAN CATCH  Final   Special Requests NONE  Final   Culture   Final    NO GROWTH Performed at [facility], [address] [place], [place] [address]    Report Status 10/01/2017 FINAL  Final  MRSA PCR Screening     Status: None   Collection Time: 09/30/17  9:11 PM  Result Value Ref Range Status   MRSA by PCR  NEGATIVE NEGATIVE Final    Comment:        The GeneXpert MRSA Assay (FDA approved for NASAL specimens only), is one component of a comprehensive MRSA colonization surveillance program. It is not intended to diagnose MRSA infection nor to guide or monitor treatment for MRSA infections. Performed at [facility], [address] [place], [place] [address]          Radiology Studies: Dg Chest 2 View  Result Date: 10/01/2017 CLINICAL DATA:  Generalized weakness, diarrhea EXAM: CHEST  2 VIEW COMPARISON:  CT chest 09/30/2017 and chest x-ray of the same date FINDINGS: The  lungs appear better aerated. Cardiomegaly is stable. Mediastinal and hilar contours are unchanged. Median sternotomy sutures again are noted. Implanted loop recorder is again noted. IMPRESSION: Improved aeration.  Stable cardiomegaly. Electronically Signed   By: Dwyane Dee M.D.   On: 10/01/2017 09:54   Ct Head Wo Contrast  Result Date: 09/30/2017 CLINICAL DATA:  Altered mental status. Weakness. Prior right frontal infarct. EXAM: CT HEAD WITHOUT CONTRAST TECHNIQUE: Contiguous axial images were obtained from the base of the skull through the vertex without intravenous contrast. COMPARISON:  April 15, 2017 FINDINGS: Brain: Mild diffuse atrophy is stable. There is a cavum septum pellucidum, an anatomic variant. There is no intracranial mass, hemorrhage, extra-axial fluid collection, or midline shift. There is evidence of a prior infarct in the right frontal lobe with extension into the right centrum semiovale superiorly, stable. There is evidence of a prior small infarct in the medial left occipital lobe posteriorly. There is evidence of prior infarct in the superior left caudate nucleus head. There is small vessel disease in both anterior limbs of the internal capsules. There is periventricular small vessel disease in the centra semiovale in this area on both sides. There is small vessel disease in each thalamus. No acute infarct is demonstrable on this study. Vascular: No appreciable hyperdense vessel. There is calcification in each carotid siphon region as well as in each distal vertebral artery. Skull: Bony calvarium appears intact. Sinuses/Orbits: There is mucosal thickening in several ethmoid air cells. Other paranasal sinuses are clear. Orbits appear symmetric bilaterally except for previous cataract removal on the left. Other: Mastoid air cells are clear. IMPRESSION: Atrophy with prior infarcts and areas of small vessel disease, essentially stable. Largest prior infarct involves the right frontal lobe. No  acute infarct evident. No mass or hemorrhage. Foci of arterial vascular calcification noted. Mucosal thickening noted in several ethmoid air cells. Electronically Signed   By: Bretta Bang III M.D.   On: 09/30/2017 12:06   Ct Angio Chest/abd/pel For Dissection W And/or W/wo  Result Date: 09/30/2017 CLINICAL DATA:  Low back pain. Generalized weakness. Concern for aortic dissection EXAM: CT ANGIOGRAPHY CHEST, ABDOMEN AND PELVIS TECHNIQUE: Multidetector CT imaging through the chest, abdomen and pelvis was performed using the standard protocol during bolus administration of intravenous contrast. Multiplanar reconstructed images and MIPs were obtained and reviewed to evaluate the vascular anatomy. CONTRAST:  ISOVUE-370 IOPAMIDOL (ISOVUE-370) INJECTION 76% COMPARISON:  CT abdomen 08/27/2017 FINDINGS: CTA CHEST FINDINGS Cardiovascular: Non IV contrast imaging demonstrates no intramural hematoma. Intimal calcification aorta aorta and great vessels. Contrast enhanced series demonstrates no dissection of the ascending, transverse, or descending thoracic aorta. No aneurysm. No evidence acute pulmonary embolism. Post CABG anatomy. Mediastinum/Nodes: No axillary or supraclavicular adenopathy. No mediastinal adenopathy. No hilar adenopathy. Lungs/Pleura: No suspicious pulmonary nodule. Calcified pleural surface within LEFT RIGHT hemithorax Musculoskeletal: No aggressive osseous lesion. Review of  the MIP images confirms the above findings. CTA ABDOMEN AND PELVIS FINDINGS VASCULAR Aorta: Normal caliber without evidence of dissection. Celiac: Widely patent.  Mild ostial calcifications. SMA: Widely patent Renals: Patent single renal arteries with ostial calcification. Stenosis of the proximal RIGHT renal artery (image 76, series 10) with post stenotic dilatation and full reconstitution of the renal artery. IMA: Patent Inflow: Normal Veins: Normal Review of the MIP images confirms the above findings. NON-VASCULAR  Hepatobiliary: No focal hepatic lesion. No biliary duct dilatation. Gallbladder is normal. Common bile duct is normal. Pancreas: Pancreas is normal. No ductal dilatation. No pancreatic inflammation. Spleen: Normal spleen Adrenals/urinary tract: Adrenal glands and kidneys are normal. The ureters and bladder normal. Stomach/Bowel: Stomach, small bowel, appendix, and cecum are normal. The colon and rectosigmoid colon are normal. Vascular/Lymphatic: Abdominal aorta is normal caliber. There is no retroperitoneal or periportal lymphadenopathy. No pelvic lymphadenopathy. Reproductive: Prostate normal. Penile reservoir within the RIGHT lower quadrant. No complicating features. Other: No free fluid. Musculoskeletal: Posterior lumbar fusion Review of the MIP images confirms the above findings. IMPRESSION: Chest Impression: 1. No evidence of thoracic aneurysm or dissection. 2. Pleural calcification in the LEFTand RIGHT lung. 3. Coronary artery calcification and aortic atherosclerotic calcification. Abdomen / Pelvis Impression: 1. No evidence of abdominal aortic aneurysm or dissection. 2. Scattered intimal calcification of the aorta. 3. No acute abdominopelvic findings. Electronically Signed   By: Genevive Bi M.D.   On: 09/30/2017 12:22        Scheduled Meds: . ARIPiprazole  5 mg Oral Daily  . aspirin  325 mg Oral Daily  . atorvastatin  80 mg Oral Daily  . donepezil  10 mg Oral Daily  . FLUoxetine  40 mg Oral Daily  . insulin aspart  0-15 Units Subcutaneous TID WC  . insulin aspart  0-5 Units Subcutaneous QHS  . lamoTRIgine  100 mg Oral BID  . mouth rinse  15 mL Mouth Rinse BID  . metoprolol tartrate  12.5 mg Oral BID  . potassium citrate  10 mEq Oral BID  . sodium chloride flush  3 mL Intravenous Q12H  . traZODone  100 mg Oral QHS   Continuous Infusions: . sodium chloride    . aztreonam Stopped (10/02/17 0130)  . heparin 1,250 Units/hr (10/02/17 0053)  . lactated ringers 100 mL/hr at 10/01/17  2208  . levofloxacin (LEVAQUIN) IV Stopped (10/01/17 1014)  . vancomycin Stopped (10/01/17 2318)     LOS: 2 days    Time spent: 40 minutes    Jalyn Rosero, Roselind Messier, MD [facility] Pager [phone]   If 7PM-7AM, please contact night-coverage [email or web address] Password [email or web address] 10/02/2017, 9:05 AM

## 2017-10-03 DIAGNOSIS — Z7901 Long term (current) use of anticoagulants: Secondary | ICD-10-CM

## 2017-10-03 DIAGNOSIS — I5021 Acute systolic (congestive) heart failure: Secondary | ICD-10-CM

## 2017-10-03 LAB — CBC
HCT: 31.6 % — ABNORMAL LOW (ref 39.0–52.0)
Hemoglobin: 9.8 g/dL — ABNORMAL LOW (ref 13.0–17.0)
MCH: 28.6 pg (ref 26.0–34.0)
MCHC: 31 g/dL (ref 30.0–36.0)
MCV: 92.1 fL (ref 78.0–100.0)
Platelets: 134 10*3/uL — ABNORMAL LOW (ref 150–400)
RBC: 3.43 MIL/uL — ABNORMAL LOW (ref 4.22–5.81)
RDW: 15.8 % — AB (ref 11.5–15.5)
WBC: 15.1 10*3/uL — ABNORMAL HIGH (ref 4.0–10.5)

## 2017-10-03 LAB — MAGNESIUM: MAGNESIUM: 2.3 mg/dL (ref 1.7–2.4)

## 2017-10-03 LAB — GLUCOSE, CAPILLARY
GLUCOSE-CAPILLARY: 137 mg/dL — AB (ref 65–99)
GLUCOSE-CAPILLARY: 118 mg/dL — AB (ref 65–99)
GLUCOSE-CAPILLARY: 101 mg/dL — AB (ref 65–99)

## 2017-10-03 LAB — BASIC METABOLIC PANEL
Anion gap: 14 (ref 5–15)
BUN: 37 mg/dL — ABNORMAL HIGH (ref 6–20)
CALCIUM: 7.8 mg/dL — AB (ref 8.9–10.3)
CHLORIDE: 103 mmol/L (ref 101–111)
CO2: 17 mmol/L — AB (ref 22–32)
CREATININE: 1.15 mg/dL (ref 0.61–1.24)
GFR calc non Af Amer: >60 mL/min (ref >60)
Glucose, Bld: 122 mg/dL — ABNORMAL HIGH (ref 65–99)
Potassium: 5.2 mmol/L — ABNORMAL HIGH (ref 3.5–5.1)
SODIUM: 134 mmol/L — AB (ref 135–145)

## 2017-10-03 LAB — HEPARIN LEVEL (UNFRACTIONATED)
HEPARIN UNFRACTIONATED: 0.72 [IU]/mL — AB (ref 0.30–0.70)
HEPARIN UNFRACTIONATED: 0.36 [IU]/mL (ref 0.30–0.70)

## 2017-10-03 LAB — LACTIC ACID, PLASMA: LACTIC ACID, VENOUS: 2.5 mmol/L — AB (ref 0.5–1.9)

## 2017-10-03 MED ORDER — MAGNESIUM CITRATE PO SOLN
0.5000 | Freq: Once | ORAL | Status: AC
  Administered 2017-10-04: 0.5 via ORAL
  Filled 2017-10-03: qty 296

## 2017-10-03 NOTE — Progress Notes (Signed)
PROGRESS NOTE    [name]  HKV:[id number] DOB: 1945-10-05 DOA: 09/30/2017 PCP: Carleene Cooper, MD   Brief Narrative:  72 yo WM PMHx  seizures, depression,CVA; OSA not on CPAP; seizures; HTN; HLD; Dementia;  ETOH abuse; DM(diet-controlled); CAD s/p CABG 2005; chronic diastolic CHF 2014, atrial flutter, AKI, Diverticulitis  s/p partial colectomy, hiatal hernia, PE 2005, nephrolithiasis 04/15/2014  Presenting with weakness.  Initially, the patient provided the history and said "I don't know" what brought him in.  He started feeling bad 2-3 years ago.  Started feeling like he does today 2-3 weeks ago.  Can't eat.  No SOB.  Occasional cough, not new, productive of clear sputum.  No fever at home.  +headache x 2-3 years, confusion x 2-3 months.  Hurts left lower back.  No chest pain, palpitations.  No abdominal pain.  No N/V/D/C.   No rash.  +weakness.  Occasional dysphagia.   Based on minimal meaningful history provided by the patient, I called and spoke to his wife by telephone.  He was previously admitted here from 1/1-7 with C. Diff colitis and discharged on oral vancomycin; he has troponin elevation thought to be related to demand ischemia and also had a BIPAP requirement qhs, possibly due to diastolic heart failure.  He left and was sent to [facility] and then he was transferred to the [place] in [place] for rehab.  His wife brought him home a week ago and he was negative for C diff prior to discharge.  2 days ago he started walking slower and leaning to his left.  Last night, he was unable to stand for his shower.  He got out of the bed overnight and fell onto his buttocks.  His wife was able to get him up and back in bed but then it happened again.  Her grandson was able to pick him up and they placed him in a lift recliner.  He was "confused with his feet."  Prior CVA with left-sided weakness but now with apparent left-sided weakness.  He has been turning his feet inward but he did not understand what  she was saying.  He did not have a fever at home until just as she called EMS.  He is intermittently confused at home.  Yesterday, he was breathing in a pattern that was bothersome to the family - "he sucks air in through his mouth and swallows it and has to burp or pass gas and he did that all day long yesterday."  No cough until today.  ?productive sputum.  Urine with an odor today.  +diarrhea but this is chronic after colonic resection; this has been better than when he had C diff.       ED Course: Sepsis - started on Vanc/Levaquin/Aztreonam.  Uncertain source - flu negative.  CXR and CTA negative.  Urine unremarkable.  Cardiology consulted - no cath while floridly septic, start Heparin, they will follow.    Subjective: 2/8 A/O 4, negative CP, negative SOB. Positive abdominal pain secondary to constipation. Positive nausea.    Assessment & Plan:   Principal Problem:   Sepsis (HCC) Active Problems:   Diabetes mellitus (HCC)   Obstructive sleep apnea   Essential hypertension   NSTEMI (non-ST elevated myocardial infarction) (HCC)   Dementia   Sepsis unspecified organism  -Continue current antibiotics: Leukocytosis and lactic acidosis improving: -Panculture pending: Currently no source -Repeat PCXR inconclusive for source -Recent C. difficile infection (treated): Currently asymptomatic for C. difficile infection. DC precautions -  Panculture negative results below - clinically patient appears improved however continued leukocytosis (stress reaction secondary to MI?) .   NSTEMI -Currently no chest pain however patient extremely fatigued  -Initial EKG with ST depressions concerning for global ischemia -TIMI risk score= 5 -Troponin significantly elevated but trending down continue to monitor Recent Labs  Lab 09/30/17 1228 09/30/17 1519 09/30/17 2125 10/01/17 0302 10/01/17 0807 10/02/17 1848  TROPONINI 14.62* 13.44* 12.87* 12.00* 9.10* 5.04*  -cardiology to perform cardiac  catheterization when patient more stable: Early next week? -ASA 325 mg daily -Lipitor 80 mg daily  -Lipid panel pending  -Heparin drip -Metoprolol 12,5mg  BID -2/6 Echocardiogram; worsening EF when compared to echocardiogram from 08/29/2017 (EF 50-55%) now EF only 20-25%. See results below   Acute on chronic systolic CHF -Strict in and out since admission +6.9 L -Daily weight Filed Weights   10/02/17 0607 10/02/17 1240 10/03/17 0438  Weight: 210 lb 8.6 oz (95.5 kg) 210 lb 8.6 oz (95.5 kg) 217 lb 6 oz (98.6 kg)  -see NSTEMI  Pulmonary hypertension -See NSTEMI  Essential HTN  -unsure of patient's compliance at home with medication -See NSTEMI    Diabetes type 2 uncontrolled with complication -2/5Hemoglobin A1c= 7.1 -Moderate SSI -Lipid panel pending  Dementia  -Must be early onset able answer all questions appropriately -Continue Aricept, Abilify, Lamictal, and Trazodone   OSA -Not on home CPAP -CPAP per respiratory  Constipation -1/2 bottle magnesium citrate   DVT prophylaxis: Heparin drip Code Status: Full Family Communication: None Disposition Plan: TBD   Consultants:  Cardiology    Procedures/Significant Events:  2/6 Echocardiogram:- Left ventricle: - mild LVH. -LVEF = 20% to 25%. - Mitral valve: mild to moderate regurgitation. - Tricuspid valve:  moderate regurgitation. - Pulmonary arteries:  PA peak pressure: 42 mm Hg (S).    I have personally reviewed and interpreted all radiology studies and my findings are as above.  VENTILATOR SETTINGS:    Cultures 2/5 blood NGTD 2/5 urine negative 2/5 MRSA by PCR negative 2/5 respiratory virus panel negative   Antimicrobials: Anti-infectives (From admission, onward)   Start     Stop   10/01/17 0800  levofloxacin (LEVAQUIN) IVPB 750 mg         09/30/17 2200  vancomycin (VANCOCIN) IVPB 750 mg/150 ml premix         09/30/17 1600  aztreonam (AZACTAM) 1 g in dextrose 5 % 50 mL IVPB         09/30/17 0715   levofloxacin (LEVAQUIN) IVPB 750 mg     09/30/17 1059   09/30/17 0715  aztreonam (AZACTAM) 2 g in dextrose 5 % 50 mL IVPB     09/30/17 0900   09/30/17 0715  vancomycin (VANCOCIN) IVPB 1000 mg/200 mL premix  Status:  Discontinued     09/30/17 0706   09/30/17 0715  vancomycin (VANCOCIN) 2,000 mg in sodium chloride 0.9 % 500 mL IVPB     09/30/17 1530       Devices    LINES / TUBES:      Continuous Infusions: . sodium chloride    . sodium chloride 35 mL/hr at 10/02/17 1852  . aztreonam Stopped (10/03/17 1123)  . heparin 1,100 Units/hr (10/03/17 0540)  . levofloxacin (LEVAQUIN) IV Stopped (10/03/17 1222)  . vancomycin Stopped (10/03/17 1320)     Objective: Vitals:   10/02/17 1651 10/02/17 2050 10/02/17 2350 10/03/17 0438  BP:  117/68 106/70 (!) 104/55  Pulse:  65 64 60  Resp:  16 16  18  Temp:  97.7 F (36.5 C) 97.6 F (36.4 C) 97.6 F (36.4 C)  TempSrc:  Oral Oral Axillary  SpO2: 95% 96% 95% 94%  Weight:    217 lb 6 oz (98.6 kg)  Height:        Intake/Output Summary (Last 24 hours) at 10/03/2017 1450 Last data filed at 10/03/2017 0700 Gross per 24 hour  Intake 1887.17 ml  Output 700 ml  Net 1187.17 ml   Filed Weights   10/02/17 0607 10/02/17 1240 10/03/17 0438  Weight: 210 lb 8.6 oz (95.5 kg) 210 lb 8.6 oz (95.5 kg) 217 lb 6 oz (98.6 kg)   Physical Exam:  General: A/O 4, No acute respiratory distress Neck:  Negative scars, masses, torticollis, lymphadenopathy, JVD Lungs: Clear to auscultation bilaterally without wheezes or crackles Cardiovascular: Regular rate and rhythm without murmur gallop or rub normal S1 and S2 Abdomen: Morbidly obese, negative abdominal pain, positive mild distention, positive soft, bowel sounds, no rebound, no ascites, no appreciable mass Extremities: No significant cyanosis, clubbing, or edema bilateral lower extremities Skin: Negative rashes, lesions, ulcers Psychiatric:  Negative depression, negative anxiety, negative fatigue,  negative mania  Central nervous system:  Cranial nerves II through XII intact, tongue/uvula midline, all extremities muscle strength 5/5, sensation intact throughout,  negative dysarthria, negative expressive aphasia, negative receptive aphasia..     Data Reviewed: Care during the described time interval was provided by me .  I have reviewed this patient's available data, including medical history, events of note, physical examination, and all test results as part of my evaluation.   CBC: Recent Labs  Lab 09/30/17 0654 10/01/17 0302 10/02/17 0305 10/03/17 0339  WBC 15.5* 14.0* 13.9* 15.1*  NEUTROABS 12.8*  --   --   --   HGB 10.9* 11.4* 10.3* 9.8*  HCT 34.5* 36.8* 33.4* 31.6*  MCV 92.2 93.4 92.5 92.1  PLT 229 169 152 134*   Basic Metabolic Panel: Recent Labs  Lab 09/30/17 0654 09/30/17 1228 10/01/17 0302 10/02/17 0305 10/03/17 0339  NA 140  --  138 136 134*  K 4.8  --  4.6 4.2 5.2*  CL 103  --  104 103 103  CO2 23  --  22 22 17*  GLUCOSE 171*  --  155* 132* 122*  BUN 38*  --  33* 39* 37*  CREATININE 1.32*  --  1.10 1.22 1.15  CALCIUM 9.2  --  8.4* 8.0* 7.8*  MG  --  2.3  --  2.2 2.3   GFR: Estimated Creatinine Clearance: 64 mL/min (by C-G formula based on SCr of 1.15 mg/dL). Liver Function Tests: Recent Labs  Lab 09/30/17 0654  AST 143*  ALT 81*  ALKPHOS 82  BILITOT 0.5  PROT 6.9  ALBUMIN 3.4*   No results for input(s): LIPASE, AMYLASE in the last 168 hours. No results for input(s): AMMONIA in the last 168 hours. Coagulation Profile: Recent Labs  Lab 09/30/17 0818 10/01/17 0302  INR 1.16 1.44   Cardiac Enzymes: Recent Labs  Lab 09/30/17 1519 09/30/17 2125 10/01/17 0302 10/01/17 0807 10/02/17 1848  TROPONINI 13.44* 12.87* 12.00* 9.10* 5.04*   BNP (last 3 results) No results for input(s): PROBNP in the last 8760 hours. HbA1C: Recent Labs    09/30/17 2125  HGBA1C 7.1*   CBG: Recent Labs  Lab 10/01/17 2129 10/02/17 0605 10/02/17 1144  10/02/17 1634 10/03/17 1128  GLUCAP 165* 105* 152* 126* 137*   Lipid Profile: Recent Labs    10/01/17 0302  CHOL 170  HDL 54  LDLCALC 85  TRIG 153*  CHOLHDL 3.1   Thyroid Function Tests: No results for input(s): TSH, T4TOTAL, FREET4, T3FREE, THYROIDAB in the last 72 hours. Anemia Panel: No results for input(s): VITAMINB12, FOLATE, FERRITIN, TIBC, IRON, RETICCTPCT in the last 72 hours. Urine analysis:    Component Value Date/Time   COLORURINE YELLOW 09/30/2017 1255   APPEARANCEUR CLEAR 09/30/2017 1255   LABSPEC >1.046 (H) 09/30/2017 1255   PHURINE 5.0 09/30/2017 1255   GLUCOSEU NEGATIVE 09/30/2017 1255   HGBUR MODERATE (A) 09/30/2017 1255   BILIRUBINUR NEGATIVE 09/30/2017 1255   BILIRUBINUR neg 04/27/2014 1205   KETONESUR NEGATIVE 09/30/2017 1255   PROTEINUR 30 (A) 09/30/2017 1255   UROBILINOGEN 0.2 03/07/2015 1005   NITRITE NEGATIVE 09/30/2017 1255   LEUKOCYTESUR NEGATIVE 09/30/2017 1255   Sepsis Labs: @LABRCNTIP (procalcitonin:4,lacticidven:4)  ) Recent Results (from the past 240 hour(s))  Culture, blood (routine x 2)     Status: None (Preliminary result)   Collection Time: 09/30/17  7:27 AM  Result Value Ref Range Status   Specimen Description BLOOD LEFT ANTECUBITAL  Final   Special Requests   Final    BOTTLES DRAWN AEROBIC AND ANAEROBIC Blood Culture adequate volume   Culture   Final    NO GROWTH 3 DAYS Performed at [facility], [address] [place], [place] [address]    Report Status PENDING  Incomplete  Culture, blood (routine x 2)     Status: None (Preliminary result)   Collection Time: 09/30/17  7:27 AM  Result Value Ref Range Status   Specimen Description BLOOD RIGHT WRIST  Final   Special Requests   Final    BOTTLES DRAWN AEROBIC AND ANAEROBIC Blood Culture adequate volume   Culture   Final    NO GROWTH 3 DAYS Performed at [facility], [address] [place], [place] [address]    Report Status PENDING  Incomplete  Respiratory  Panel by PCR     Status: None   Collection Time: 09/30/17  8:54 AM  Result Value Ref Range Status   Adenovirus NOT DETECTED NOT DETECTED Final   Coronavirus 229E NOT DETECTED NOT DETECTED Final   Coronavirus HKU1 NOT DETECTED NOT DETECTED Final   Coronavirus NL63 NOT DETECTED NOT DETECTED Final   Coronavirus OC43 NOT DETECTED NOT DETECTED Final   Metapneumovirus NOT DETECTED NOT DETECTED Final   Rhinovirus / Enterovirus NOT DETECTED NOT DETECTED Final   Influenza A NOT DETECTED NOT DETECTED Final   Influenza B NOT DETECTED NOT DETECTED Final   Parainfluenza Virus 1 NOT DETECTED NOT DETECTED Final   Parainfluenza Virus 2 NOT DETECTED NOT DETECTED Final   Parainfluenza Virus 3 NOT DETECTED NOT DETECTED Final   Parainfluenza Virus 4 NOT DETECTED NOT DETECTED Final   Respiratory Syncytial Virus NOT DETECTED NOT DETECTED Final   Bordetella pertussis NOT DETECTED NOT DETECTED Final   Chlamydophila pneumoniae NOT DETECTED NOT DETECTED Final   Mycoplasma pneumoniae NOT DETECTED NOT DETECTED Final    Comment: Performed at [facility], [address], [place], [place] [address]  Urine culture     Status: None   Collection Time: 09/30/17  1:00 PM  Result Value Ref Range Status   Specimen Description URINE, CLEAN CATCH  Final   Special Requests NONE  Final   Culture   Final    NO GROWTH Performed at [facility], [address], [place], [place] [address]    Report  Status 10/01/2017 FINAL  Final  MRSA PCR Screening     Status: None   Collection Time: 09/30/17  9:11 PM  Result Value Ref Range Status   MRSA by PCR NEGATIVE NEGATIVE Final    Comment:        The GeneXpert MRSA Assay (FDA approved for NASAL specimens only), is one component of a comprehensive MRSA colonization surveillance program. It is not intended to diagnose MRSA infection nor to guide or monitor treatment for MRSA infections. Performed at [facility], [address], [place], [place] [address]            Radiology Studies: No results found.      Scheduled Meds: . ARIPiprazole  5 mg Oral Daily  . aspirin  325 mg Oral Daily  . atorvastatin  80 mg Oral Daily  . donepezil  10 mg Oral Daily  . FLUoxetine  40 mg Oral Daily  . insulin aspart  0-15 Units Subcutaneous TID WC  . insulin aspart  0-5 Units Subcutaneous QHS  . lamoTRIgine  100 mg Oral BID  . mouth rinse  15 mL Mouth Rinse BID  . metoprolol tartrate  12.5 mg Oral BID  . potassium citrate  10 mEq Oral BID  . sodium chloride flush  3 mL Intravenous Q12H  . traZODone  100 mg Oral QHS   Continuous Infusions: . sodium chloride    . sodium chloride 35 mL/hr at 10/02/17 1852  . aztreonam Stopped (10/03/17 1123)  . heparin 1,100 Units/hr (10/03/17 0540)  . levofloxacin (LEVAQUIN) IV Stopped (10/03/17 1222)  . vancomycin Stopped (10/03/17 1320)     LOS: 3 days    Time spent: 40 minutes    Nathan Moctezuma, Roselind Messier, MD [facility] Pager [phone]   If 7PM-7AM, please contact night-coverage [email or web address] Password [email or web address] 10/03/2017, 2:50 PM

## 2017-10-03 NOTE — Progress Notes (Signed)
ANTICOAGULATION CONSULT NOTE - Follow Up Consult  Pharmacy Consult for Heparin Indication: chest pain/ACS  Patient Measurements: Height: 5' 7"  (170.2 cm) Weight: 217 lb 6 oz (98.6 kg) IBW/kg (Calculated) : 66.1 Heparin Dosing Weight: 83 kg  Vital Signs:    Labs: Recent Labs    10/01/17 0302 10/01/17 0807  10/02/17 0305 10/02/17 1848 10/03/17 0339 10/03/17 1326  HGB 11.4*  --   --  10.3*  --  9.8*  --   HCT 36.8*  --   --  33.4*  --  31.6*  --   PLT 169  --   --  152  --  134*  --   LABPROT 17.4*  --   --   --   --   --   --   INR 1.44  --   --   --   --   --   --   HEPARINUNFRC 0.52  --    < > 0.47  --  0.72* 0.36  CREATININE 1.10  --   --  1.22  --  1.15  --   TROPONINI 12.00* 9.10*  --   --  5.04*  --   --    < > = values in this interval not displayed.    Estimated Creatinine Clearance: 64 mL/min (by C-G formula based on SCr of 1.15 mg/dL).  Assessment:   Continues on IV heparin for ACS.      Heparin level is now therapeutic (0.36) on 1100 units/hr.  Goal of Therapy:  Heparin level 0.3-0.7 units/ml Monitor platelets by anticoagulation protocol: Yes   Plan:   Continue heparin drip at 1100 units/hr.  Next heparin level and CBC in am.  [name], [place] Pager: [phone] 10/03/2017,5:06 PM

## 2017-10-03 NOTE — Progress Notes (Signed)
[place] for heparin Indication: chest pain/ACS  Allergies  Allergen Reactions  . Cephalexin     Unknown  . Doxycycline     Unknown  . Keflex [Cephalexin]   . Lac Bovis Other (See Comments)    lactose intolerant  . Pentazocine Lactate     He passed out- he had a seizure.  This occurred around 2000  . Talwin [Pentazocine] Other (See Comments)    hallucinations  . Tape Other (See Comments)    Paper tape only please.  Freddi Starr [Tizanidine Hcl] Other (See Comments)    Lightheaded and dizzy    Patient Measurements: Height: 5' 7"  (170.2 cm) Weight: 217 lb 6 oz (98.6 kg) IBW/kg (Calculated) : 66.1 Heparin Dosing Weight: 83 kg  Vital Signs: Temp: 97.6 F (36.4 C) (02/08 0438) Temp Source: Axillary (02/08 0438) BP: 104/55 (02/08 0438) Pulse Rate: 60 (02/08 0438)  Labs: Recent Labs    09/30/17 0818  10/01/17 0302 10/01/17 0807 10/01/17 1110 10/02/17 0305 10/02/17 1848 10/03/17 0339  HGB  --   --  11.4*  --   --  10.3*  --  9.8*  HCT  --   --  36.8*  --   --  33.4*  --  31.6*  PLT  --   --  169  --   --  152  --  134*  LABPROT 14.7  --  17.4*  --   --   --   --   --   INR 1.16  --  1.44  --   --   --   --   --   HEPARINUNFRC  --    < > 0.52  --  0.52 0.47  --  0.72*  CREATININE  --   --  1.10  --   --  1.22  --  1.15  TROPONINI  --    < > 12.00* 9.10*  --   --  5.04*  --    < > = values in this interval not displayed.    Estimated Creatinine Clearance: 64 mL/min (by C-G formula based on SCr of 1.15 mg/dL).  Assessment: CC/HPI: generalized weakness  PMH: depression, HTN, HLD, CAD s/p CABG, OSA, CVA, EtOH abuse, DM, GERD, aflutter, seizuers  Anticoag: none pta - currently on IV Heparin for ACS.  Heparin level now slightly supratherapeutic.   H&H low stable, Plt 229>134  Goal of Therapy:  Heparin level 0.3-0.7 units/ml Monitor platelets by anticoagulation protocol: Yes   Plan:  -Reduce heparin to 1100  units/hr -Daily HL, CBC -Check level in 8 hours -Watch trend in plts    Harvel Quale  10/03/2017 5:28 AM

## 2017-10-03 NOTE — Progress Notes (Addendum)
Progress Note  Patient Name: [name] Date of Encounter: 10/03/2017  Primary Cardiologist: Peter Martinique, MD   Subjective   Still weak, but better.  Inpatient Medications    Scheduled Meds: . ARIPiprazole  5 mg Oral Daily  . aspirin  325 mg Oral Daily  . atorvastatin  80 mg Oral Daily  . donepezil  10 mg Oral Daily  . FLUoxetine  40 mg Oral Daily  . insulin aspart  0-15 Units Subcutaneous TID WC  . insulin aspart  0-5 Units Subcutaneous QHS  . lamoTRIgine  100 mg Oral BID  . magnesium citrate  0.5 Bottle Oral Once  . mouth rinse  15 mL Mouth Rinse BID  . metoprolol tartrate  12.5 mg Oral BID  . potassium citrate  10 mEq Oral BID  . sodium chloride flush  3 mL Intravenous Q12H  . traZODone  100 mg Oral QHS   Continuous Infusions: . sodium chloride    . sodium chloride 35 mL/hr at 10/02/17 1852  . aztreonam Stopped (10/03/17 1715)  . heparin 1,100 Units/hr (10/03/17 2125)  . levofloxacin (LEVAQUIN) IV Stopped (10/03/17 1222)  . vancomycin Stopped (10/03/17 1320)   PRN Meds: sodium chloride, acetaminophen, fluticasone, nitroGLYCERIN, ondansetron (ZOFRAN) IV, sodium chloride flush   Vital Signs    Vitals:   10/03/17 0830 10/03/17 1310 10/03/17 1723 10/03/17 2010  BP: 136/79 133/79 106/60 113/68  Pulse: 67 65  63  Resp: (!) 22 20  (!) 21  Temp:    98.3 F (36.8 C)  TempSrc:    Axillary  SpO2: 97% 97%  96%  Weight:      Height:        Intake/Output Summary (Last 24 hours) at 10/03/2017 2358 Last data filed at 10/03/2017 1320 Gross per 24 hour  Intake 1887.17 ml  Output 550 ml  Net 1337.17 ml   Filed Weights   10/02/17 0607 10/02/17 1240 10/03/17 0438  Weight: 210 lb 8.6 oz (95.5 kg) 210 lb 8.6 oz (95.5 kg) 217 lb 6 oz (98.6 kg)    Telemetry    NSR - Personally Reviewed  ECG      Physical Exam   GEN: No acute distress.  Generally slow Neck: No JVD Cardiac: RRR, no murmurs, rubs, or gallops.  Respiratory: Clear to auscultation  bilaterally. GI: Soft, nontender, non-distended  MS: No edema; No deformity. Neuro:  Nonfocal  Psych: Flat affect   Labs    Chemistry Recent Labs  Lab 09/30/17 0654 10/01/17 0302 10/02/17 0305 10/03/17 0339  NA 140 138 136 134*  K 4.8 4.6 4.2 5.2*  CL 103 104 103 103  CO2 23 22 22  17*  GLUCOSE 171* 155* 132* 122*  BUN 38* 33* 39* 37*  CREATININE 1.32* 1.10 1.22 1.15  CALCIUM 9.2 8.4* 8.0* 7.8*  PROT 6.9  --   --   --   ALBUMIN 3.4*  --   --   --   AST 143*  --   --   --   ALT 81*  --   --   --   ALKPHOS 82  --   --   --   BILITOT 0.5  --   --   --   GFRNONAA 52* >60 57* >60  GFRAA >60 >60 >60 >60  ANIONGAP 14 12 11 14      Hematology Recent Labs  Lab 10/01/17 0302 10/02/17 0305 10/03/17 0339  WBC 14.0* 13.9* 15.1*  RBC 3.94* 3.61* 3.43*  HGB  11.4* 10.3* 9.8*  HCT 36.8* 33.4* 31.6*  MCV 93.4 92.5 92.1  MCH 28.9 28.5 28.6  MCHC 31.0 30.8 31.0  RDW 15.9* 15.5 15.8*  PLT 169 152 134*    Cardiac Enzymes Recent Labs  Lab 09/30/17 2125 10/01/17 0302 10/01/17 0807 10/02/17 1848  TROPONINI 12.87* 12.00* 9.10* 5.04*    Recent Labs  Lab 09/30/17 0700  TROPIPOC 13.14*     BNPNo results for input(s): BNP, PROBNP in the last 168 hours.   DDimer No results for input(s): DDIMER in the last 168 hours.   Radiology    No results found.  Cardiac Studies   Decreased EF  Patient Profile     72 y.o. male with acute decrease in EF in the setting of presumed sepsis  Assessment & Plan    NSTEMI: No CP. Troponin trending down.  No angiogram given his overall condition at this time and lack of sx.  Could consider cath when sepsis resolved.  Given his comorbidities, may just need medical therapy for now.    Acute systolicheart failure: limiting IV fluids.  Breathing improved.  Titrate meds as BP allows.   ABx to treat infection.  Has been off Eliquis and on heparin.  If no invasive procedures to be done this admission, could consider switching back to oral  anticoagulant.  For questions or updates, please contact [place] Please consult [email or web address] for contact info under Cardiology/STEMI.      Signed, Larae Grooms, MD  10/03/2017, 11:58 PM

## 2017-10-04 ENCOUNTER — Inpatient Hospital Stay (HOSPITAL_COMMUNITY): Payer: Medicare Other

## 2017-10-04 LAB — COMPREHENSIVE METABOLIC PANEL
ALK PHOS: 379 U/L — AB (ref 38–126)
ALT: 403 U/L — AB (ref 17–63)
AST: 329 U/L — ABNORMAL HIGH (ref 15–41)
Albumin: 2.7 g/dL — ABNORMAL LOW (ref 3.5–5.0)
Anion gap: 13 (ref 5–15)
BUN: 32 mg/dL — ABNORMAL HIGH (ref 6–20)
CALCIUM: 7.9 mg/dL — AB (ref 8.9–10.3)
CO2: 18 mmol/L — AB (ref 22–32)
Chloride: 102 mmol/L (ref 101–111)
Creatinine, Ser: 1.1 mg/dL (ref 0.61–1.24)
GFR calc Af Amer: >60 mL/min (ref >60)
GFR calc non Af Amer: >60 mL/min (ref >60)
GLUCOSE: 126 mg/dL — AB (ref 65–99)
Potassium: 4.3 mmol/L (ref 3.5–5.1)
SODIUM: 133 mmol/L — AB (ref 135–145)
Total Bilirubin: 1.1 mg/dL (ref 0.3–1.2)
Total Protein: 5.9 g/dL — ABNORMAL LOW (ref 6.5–8.1)

## 2017-10-04 LAB — CBC
HEMATOCRIT: 33.9 % — AB (ref 39.0–52.0)
HEMOGLOBIN: 10.5 g/dL — AB (ref 13.0–17.0)
MCH: 28.6 pg (ref 26.0–34.0)
MCHC: 31 g/dL (ref 30.0–36.0)
MCV: 92.4 fL (ref 78.0–100.0)
Platelets: 132 10*3/uL — ABNORMAL LOW (ref 150–400)
RBC: 3.67 MIL/uL — ABNORMAL LOW (ref 4.22–5.81)
RDW: 15.9 % — ABNORMAL HIGH (ref 11.5–15.5)
WBC: 13.9 10*3/uL — AB (ref 4.0–10.5)

## 2017-10-04 LAB — LACTIC ACID, PLASMA: Lactic Acid, Venous: 2 mmol/L (ref 0.5–1.9)

## 2017-10-04 LAB — LIPID PANEL
Cholesterol: 161 mg/dL (ref 0–200)
HDL: 51 mg/dL (ref >40)
LDL CALC: 95 mg/dL (ref 0–99)
Total CHOL/HDL Ratio: 3.2 RATIO
Triglycerides: 76 mg/dL (ref <150)
VLDL: 15 mg/dL (ref 0–40)

## 2017-10-04 LAB — GLUCOSE, CAPILLARY
GLUCOSE-CAPILLARY: 107 mg/dL — AB (ref 65–99)
GLUCOSE-CAPILLARY: 101 mg/dL — AB (ref 65–99)
Glucose-Capillary: 132 mg/dL — ABNORMAL HIGH (ref 65–99)
Glucose-Capillary: 87 mg/dL (ref 65–99)

## 2017-10-04 LAB — HEPARIN LEVEL (UNFRACTIONATED): Heparin Unfractionated: 0.64 IU/mL (ref 0.30–0.70)

## 2017-10-04 LAB — MAGNESIUM: MAGNESIUM: 2.3 mg/dL (ref 1.7–2.4)

## 2017-10-04 NOTE — Progress Notes (Addendum)
PROGRESS NOTE    [name]  GLO:[id number] DOB: Jul 09, 1946 DOA: 09/30/2017 PCP: Carleene Cooper, MD   Brief Narrative:  72 yo WM PMHx  Seizures, depression,CVA; OSA not on CPAP; seizures; HTN; HLD; Dementia;  ETOH abuse; DM(diet-controlled); CAD s/p CABG 2005; chronic diastolic CHF 2014, atrial flutter, AKI, Diverticulitis  s/p partial colectomy, hiatal hernia, PE 2005, nephrolithiasis 04/15/2014  Presenting with weakness.  Initially, the patient provided the history and said "I don't know" what brought him in.  He started feeling bad 2-3 years ago.  Started feeling like he does today 2-3 weeks ago.  Can't eat.  No SOB.  Occasional cough, not new, productive of clear sputum.  No fever at home.  +headache x 2-3 years, confusion x 2-3 months.  Hurts left lower back.  No chest pain, palpitations.  No abdominal pain.  No N/V/D/C.   No rash.  +weakness.  Occasional dysphagia.   Based on minimal meaningful history provided by the patient, I called and spoke to his wife by telephone.  He was previously admitted here from 1/1-7 with C. Diff colitis and discharged on oral vancomycin; he has troponin elevation thought to be related to demand ischemia and also had a BIPAP requirement qhs, possibly due to diastolic heart failure.  He left and was sent to [facility] and then he was transferred to the [place] in [place] for rehab.  His wife brought him home a week ago and he was negative for C diff prior to discharge.  2 days ago he started walking slower and leaning to his left.  Last night, he was unable to stand for his shower.  He got out of the bed overnight and fell onto his buttocks.  His wife was able to get him up and back in bed but then it happened again.  Her grandson was able to pick him up and they placed him in a lift recliner.  He was "confused with his feet."  Prior CVA with left-sided weakness but now with apparent left-sided weakness.  He has been turning his feet inward but he did not understand what  she was saying.  He did not have a fever at home until just as she called EMS.  He is intermittently confused at home.  Yesterday, he was breathing in a pattern that was bothersome to the family - "he sucks air in through his mouth and swallows it and has to burp or pass gas and he did that all day long yesterday."  No cough until today.  ?productive sputum.  Urine with an odor today.  +diarrhea but this is chronic after colonic resection; this has been better than when he had C diff.       ED Course: Sepsis - started on Vanc/Levaquin/Aztreonam.  Uncertain source - flu negative.  CXR and CTA negative.  Urine unremarkable.  Cardiology consulted - no cath while floridly septic, start Heparin, they will follow.    Subjective: 2/9 A/O 2 (does not know when, why). Negative SOB, negative abdominal pain. Positive hallucinations per wife believes he sees the dog and needs to feed him.      Assessment & Plan:   Principal Problem:   Sepsis (HCC) Active Problems:   Diabetes mellitus (HCC)   Obstructive sleep apnea   Essential hypertension   NSTEMI (non-ST elevated myocardial infarction) (HCC)   Dementia   Sepsis unspecified organism  -Continue current antibiotics: Leukocytosis and lactic acidosis improving: -Panculture;Currently negative source of infection. -2/9 repeat PCXR: Inconclusive  for aspiration or pneumonia -Recent C. difficile infection (treated): Currently asymptomatic for C. difficile infection. DC precautions -Panculture negative results below -clinically patient appears improved however continued leukocytosis (stress reaction secondary to MI?)   SBO vs/Ileus - NPO; until workup complete -KUB pending -Barium swallow pending -If barium swallow findings significant contact [facility]   NSTEMI -Currently no chest pain however patient extremely fatigued  -Initial EKG with ST depressions concerning for global ischemia -TIMI risk score= 5 -Troponin significantly elevated but  trending down continue to monitor Recent Labs  Lab 09/30/17 1228 09/30/17 1519 09/30/17 2125 10/01/17 0302 10/01/17 0807 10/02/17 1848  TROPONINI 14.62* 13.44* 12.87* 12.00* 9.10* 5.04*  -cardiology to perform cardiac catheterization when patient more stable: Early next week? -ASA 325 mg daily -Lipitor 80 mg daily -Lipid panel pending -Heparin drip -Metoprolol 12.5 mg BID -2/6 Echocardiogram; worsening EF when compared to echocardiogram from 08/29/2017 (EF 50-55%) now EF only 20-25%. See results below   Acute on chronic systolic CHF -Strict in and out since admission +6.4 L -Daily weight Filed Weights   10/02/17 1240 10/03/17 0438 10/04/17 8295  Weight: 210 lb 8.6 oz (95.5 kg) 217 lb 6 oz (98.6 kg) 211 lb 3.2 oz (95.8 kg)  -see NSTEMI  Pulmonary hypertension -See NSTEMI  Essential HTN  -unsure of patient's compliance at home with medication -See NSTEMI    Diabetes type 2 uncontrolled with complication -2/5Hemoglobin A1c= 7.1 -Moderate SSI -Lipid panel pending  Dementia  -Must be early onset able answer all questions appropriately -Continue Aricept, Abilify, Lamictal, and Trazodone   OSA -Not on home CPAP -CPAP per respiratory  Constipation    DVT prophylaxis: Heparin drip Code Status: Full Family Communication: Wife at bedside spoke at length concerning plan of care. Disposition Plan: TBD   Consultants:  Cardiology    Procedures/Significant Events:  2/6 Echocardiogram:- Left ventricle: - mild LVH. -LVEF = 20% to 25%. - Mitral valve: mild to moderate regurgitation. - Tricuspid valve:  moderate regurgitation. - Pulmonary arteries:  PA peak pressure: 42 mm Hg (S). 2/9 PCXR: Negative aspiration, pneumonia    I have personally reviewed and interpreted all radiology studies and my findings are as above.  VENTILATOR SETTINGS:    Cultures 2/5 blood NGTD 2/5 urine negative 2/5 MRSA by PCR negative 2/5 respiratory virus panel  negative   Antimicrobials: Anti-infectives (From admission, onward)   Start     Stop   10/01/17 0800  levofloxacin (LEVAQUIN) IVPB 750 mg         09/30/17 2200  vancomycin (VANCOCIN) IVPB 750 mg/150 ml premix         09/30/17 1600  aztreonam (AZACTAM) 1 g in dextrose 5 % 50 mL IVPB         09/30/17 0715  levofloxacin (LEVAQUIN) IVPB 750 mg     09/30/17 1059   09/30/17 0715  aztreonam (AZACTAM) 2 g in dextrose 5 % 50 mL IVPB     09/30/17 0900   09/30/17 0715  vancomycin (VANCOCIN) IVPB 1000 mg/200 mL premix  Status:  Discontinued     09/30/17 0706   09/30/17 0715  vancomycin (VANCOCIN) 2,000 mg in sodium chloride 0.9 % 500 mL IVPB     09/30/17 1530       Devices    LINES / TUBES:      Continuous Infusions: . sodium chloride    . sodium chloride 35 mL/hr (10/04/17 0936)  . aztreonam Stopped (10/04/17 1007)  . heparin 1,100 Units/hr (10/03/17 2125)  .  levofloxacin (LEVAQUIN) IV Stopped (10/04/17 1112)  . vancomycin Stopped (10/04/17 1435)     Objective: Vitals:   10/04/17 0941 10/04/17 0943 10/04/17 1203 10/04/17 1400  BP:  126/75 120/83   Pulse:  63 65   Resp:   (!) 25   Temp: 98.8 F (37.1 C)  (!) 97.4 F (36.3 C) 97.7 F (36.5 C)  TempSrc: Oral  Oral Oral  SpO2:   93%   Weight:      Height:        Intake/Output Summary (Last 24 hours) at 10/04/2017 1601 Last data filed at 10/04/2017 0946 Gross per 24 hour  Intake 3 ml  Output 500 ml  Net -497 ml   Filed Weights   10/02/17 1240 10/03/17 0438 10/04/17 7829  Weight: 210 lb 8.6 oz (95.5 kg) 217 lb 6 oz (98.6 kg) 211 lb 3.2 oz (95.8 kg)    Physical Exam:@@@@@@  General: A/O 2 (does not know when, why). No acute respiratory distress, extremely weak Neck:  Negative scars, masses, torticollis, lymphadenopathy, JVD Lungs: tachypnea Clear to auscultation bilaterally without wheezes or crackles. Significant amount of food/drink in suction canister (aspiration?) Cardiovascular: Regular rhythm and rate  without murmur gallop or rub normal S1 and S2 Abdomen: negative abdominal pain, nondistended, positive soft, bowel sounds, no rebound, no ascites, no appreciable mass Extremities: No significant cyanosis, clubbing, or edema bilateral lower extremities Skin: Negative rashes, lesions, ulcers Psychiatric:  Negative depression, negative anxiety, negative fatigue, negative mania  Central nervous system:  Cranial nerves II through XII intact, tongue/uvula midline, all extremities muscle strength 5/5, sensation intact throughout,  negative dysarthria, negative expressive aphasia, negative receptive aphasia.    Data Reviewed: Care during the described time interval was provided by me .  I have reviewed this patient's available data, including medical history, events of note, physical examination, and all test results as part of my evaluation.   CBC: Recent Labs  Lab 09/30/17 0654 10/01/17 0302 10/02/17 0305 10/03/17 0339 10/04/17 0254  WBC 15.5* 14.0* 13.9* 15.1* 13.9*  NEUTROABS 12.8*  --   --   --   --   HGB 10.9* 11.4* 10.3* 9.8* 10.5*  HCT 34.5* 36.8* 33.4* 31.6* 33.9*  MCV 92.2 93.4 92.5 92.1 92.4  PLT 229 169 152 134* 132*   Basic Metabolic Panel: Recent Labs  Lab 09/30/17 0654 09/30/17 1228 10/01/17 0302 10/02/17 0305 10/03/17 0339 10/04/17 0254  NA 140  --  138 136 134* 133*  K 4.8  --  4.6 4.2 5.2* 4.3  CL 103  --  104 103 103 102  CO2 23  --  22 22 17* 18*  GLUCOSE 171*  --  155* 132* 122* 126*  BUN 38*  --  33* 39* 37* 32*  CREATININE 1.32*  --  1.10 1.22 1.15 1.10  CALCIUM 9.2  --  8.4* 8.0* 7.8* 7.9*  MG  --  2.3  --  2.2 2.3 2.3   GFR: Estimated Creatinine Clearance: 66 mL/min (by C-G formula based on SCr of 1.1 mg/dL). Liver Function Tests: Recent Labs  Lab 09/30/17 0654 10/04/17 0254  AST 143* 329*  ALT 81* 403*  ALKPHOS 82 379*  BILITOT 0.5 1.1  PROT 6.9 5.9*  ALBUMIN 3.4* 2.7*   No results for input(s): LIPASE, AMYLASE in the last 168 hours. No  results for input(s): AMMONIA in the last 168 hours. Coagulation Profile: Recent Labs  Lab 09/30/17 0818 10/01/17 0302  INR 1.16 1.44   Cardiac Enzymes: Recent  Labs  Lab 09/30/17 1519 09/30/17 2125 10/01/17 0302 10/01/17 0807 10/02/17 1848  TROPONINI 13.44* 12.87* 12.00* 9.10* 5.04*   BNP (last 3 results) No results for input(s): PROBNP in the last 8760 hours. HbA1C: No results for input(s): HGBA1C in the last 72 hours. CBG: Recent Labs  Lab 10/03/17 1128 10/03/17 1549 10/03/17 2133 10/04/17 0617 10/04/17 1136  GLUCAP 137* 118* 101* 107* 132*   Lipid Profile: Recent Labs    10/04/17 0254  CHOL 161  HDL 51  LDLCALC 95  TRIG 76  CHOLHDL 3.2   Thyroid Function Tests: No results for input(s): TSH, T4TOTAL, FREET4, T3FREE, THYROIDAB in the last 72 hours. Anemia Panel: No results for input(s): VITAMINB12, FOLATE, FERRITIN, TIBC, IRON, RETICCTPCT in the last 72 hours. Urine analysis:    Component Value Date/Time   COLORURINE YELLOW 09/30/2017 1255   APPEARANCEUR CLEAR 09/30/2017 1255   LABSPEC >1.046 (H) 09/30/2017 1255   PHURINE 5.0 09/30/2017 1255   GLUCOSEU NEGATIVE 09/30/2017 1255   HGBUR MODERATE (A) 09/30/2017 1255   BILIRUBINUR NEGATIVE 09/30/2017 1255   BILIRUBINUR neg 04/27/2014 1205   KETONESUR NEGATIVE 09/30/2017 1255   PROTEINUR 30 (A) 09/30/2017 1255   UROBILINOGEN 0.2 03/07/2015 1005   NITRITE NEGATIVE 09/30/2017 1255   LEUKOCYTESUR NEGATIVE 09/30/2017 1255   Sepsis Labs: @LABRCNTIP (procalcitonin:4,lacticidven:4)  ) Recent Results (from the past 240 hour(s))  Culture, blood (routine x 2)     Status: None (Preliminary result)   Collection Time: 09/30/17  7:27 AM  Result Value Ref Range Status   Specimen Description BLOOD LEFT ANTECUBITAL  Final   Special Requests   Final    BOTTLES DRAWN AEROBIC AND ANAEROBIC Blood Culture adequate volume   Culture   Final    NO GROWTH 4 DAYS Performed at [facility], [address]  [place], [place] [address]    Report Status PENDING  Incomplete  Culture, blood (routine x 2)     Status: None (Preliminary result)   Collection Time: 09/30/17  7:27 AM  Result Value Ref Range Status   Specimen Description BLOOD RIGHT WRIST  Final   Special Requests   Final    BOTTLES DRAWN AEROBIC AND ANAEROBIC Blood Culture adequate volume   Culture   Final    NO GROWTH 4 DAYS Performed at [facility], [address] [place], [place] [address]    Report Status PENDING  Incomplete  Respiratory Panel by PCR     Status: None   Collection Time: 09/30/17  8:54 AM  Result Value Ref Range Status   Adenovirus NOT DETECTED NOT DETECTED Final   Coronavirus 229E NOT DETECTED NOT DETECTED Final   Coronavirus HKU1 NOT DETECTED NOT DETECTED Final   Coronavirus NL63 NOT DETECTED NOT DETECTED Final   Coronavirus OC43 NOT DETECTED NOT DETECTED Final   Metapneumovirus NOT DETECTED NOT DETECTED Final   Rhinovirus / Enterovirus NOT DETECTED NOT DETECTED Final   Influenza A NOT DETECTED NOT DETECTED Final   Influenza B NOT DETECTED NOT DETECTED Final   Parainfluenza Virus 1 NOT DETECTED NOT DETECTED Final   Parainfluenza Virus 2 NOT DETECTED NOT DETECTED Final   Parainfluenza Virus 3 NOT DETECTED NOT DETECTED Final   Parainfluenza Virus 4 NOT DETECTED NOT DETECTED Final   Respiratory Syncytial Virus NOT DETECTED NOT DETECTED Final   Bordetella pertussis NOT DETECTED NOT DETECTED Final   Chlamydophila pneumoniae NOT DETECTED NOT DETECTED Final   Mycoplasma pneumoniae NOT DETECTED NOT DETECTED Final  Comment: Performed at [facility], [address], [place], [place] [address]  Urine culture     Status: None   Collection Time: 09/30/17  1:00 PM  Result Value Ref Range Status   Specimen Description URINE, CLEAN CATCH  Final   Special Requests NONE  Final   Culture   Final    NO GROWTH Performed at [facility], [address] [place], [place] [address]    Report Status  10/01/2017 FINAL  Final  MRSA PCR Screening     Status: None   Collection Time: 09/30/17  9:11 PM  Result Value Ref Range Status   MRSA by PCR NEGATIVE NEGATIVE Final    Comment:        The GeneXpert MRSA Assay (FDA approved for NASAL specimens only), is one component of a comprehensive MRSA colonization surveillance program. It is not intended to diagnose MRSA infection nor to guide or monitor treatment for MRSA infections. Performed at [facility], [address] [place], [place] [address]          Radiology Studies: Dg Chest Port 1 View  Result Date: 10/04/2017 CLINICAL DATA:  Aspiration EXAM: PORTABLE CHEST 1 VIEW COMPARISON:  10/01/2017 FINDINGS: Prior CABG. Cardiomegaly. Loop recorder device noted in the left chest wall. No overt edema, confluent opacity or effusion. IMPRESSION: Cardiomegaly.  No active disease. Electronically Signed   By: Charlett Nose M.D.   On: 10/04/2017 15:13        Scheduled Meds: . ARIPiprazole  5 mg Oral Daily  . aspirin  325 mg Oral Daily  . atorvastatin  80 mg Oral Daily  . donepezil  10 mg Oral Daily  . FLUoxetine  40 mg Oral Daily  . insulin aspart  0-15 Units Subcutaneous TID WC  . insulin aspart  0-5 Units Subcutaneous QHS  . lamoTRIgine  100 mg Oral BID  . mouth rinse  15 mL Mouth Rinse BID  . metoprolol tartrate  12.5 mg Oral BID  . potassium citrate  10 mEq Oral BID  . sodium chloride flush  3 mL Intravenous Q12H  . traZODone  100 mg Oral QHS   Continuous Infusions: . sodium chloride    . sodium chloride 35 mL/hr (10/04/17 0936)  . aztreonam Stopped (10/04/17 1007)  . heparin 1,100 Units/hr (10/03/17 2125)  . levofloxacin (LEVAQUIN) IV Stopped (10/04/17 1112)  . vancomycin Stopped (10/04/17 1435)     LOS: 4 days    Time spent: 40 minutes    Denni France, Roselind Messier, MD [facility] Pager [phone]   If 7PM-7AM, please contact night-coverage [email or web address] Password [email or web address] 10/04/2017, 4:01 PM

## 2017-10-04 NOTE — Plan of Care (Signed)
  Progressing Education: Knowledge of General Education information will improve 10/04/2017 2245 - Progressing by Drenda Freeze, RN Health Behavior/Discharge Planning: Ability to manage health-related needs will improve 10/04/2017 2245 - Progressing by Drenda Freeze, RN Clinical Measurements: Ability to maintain clinical measurements within normal limits will improve 10/04/2017 2245 - Progressing by Drenda Freeze, RN

## 2017-10-04 NOTE — Progress Notes (Signed)
Patient placed himself on nighttime BiPAP. Tolerating well and will call if any further assistance needed.

## 2017-10-04 NOTE — Evaluation (Signed)
Clinical/Bedside Swallow Evaluation Patient Details  Name: [name] MRN: [id number] Date of Birth: 1945-11-07  Today's Date: 10/04/2017 Time: SLP Start Time (ACUTE ONLY): 1600 SLP Stop Time (ACUTE ONLY): 1620 SLP Time Calculation (min) (ACUTE ONLY): 20 min  Past Medical History:  Past Medical History:  Diagnosis Date  . AKI (acute kidney injury) ([place])    a. 6/14: resolved after d/c ARB;  b. RA U/S 7/14: no RA stenosis  . Arthritis    of spine  . Atrial flutter ([place])    a. 2008 - one episode in setting of sepsis/shock.  . Back pain    persistent  . Coronary artery disease    a. s/p CABG 2005. b. low risk nuc 2016.  Marland Kitchen Depression   . Diabetes mellitus    controled by diet  . Diverticulitis    a. s/p partial colectomy remotely.  Marland Kitchen GERD (gastroesophageal reflux disease)   . H/O alcohol abuse   . Headache(784.0)   . Hearing loss   . History of hiatal hernia   . Hx of echocardiogram    Echo 7/14:  Mod LVH, EF 55-60%, Gr 1 DD, mild MR, PASP 36  . Hyperlipidemia   . Hypertension   . Pulmonary embolism ([place]) 2005  . Renal stone 04/15/2014  . Seizures ([place])   . Sleep apnea    does not use CPAP  . Stroke [facility])    Past Surgical History:  Past Surgical History:  Procedure Laterality Date  . APPENDECTOMY    . BACK SURGERY    . CARDIAC CATHETERIZATION    . CERVICAL FUSION    . CORONARY ARTERY BYPASS GRAFT  2005   LIMA graft to LAD,saphenous vein graft to diag.,circumflex, marginal,and to the RCA  . CYSTOSCOPY/RETROGRADE/URETEROSCOPY/STONE EXTRACTION WITH BASKET Left 04/16/2014   Procedure: CYSTOSCOPY/RETROGRADE/LEFT URETEROSCOPY/STONE EXTRACTION WITH BASKET AND LASER LITHOTRIPSY;  Surgeon: Malka So, MD;  Location: [facility];  Service: Urology;  Laterality: Left;  With STENT  . ESOPHAGOGASTRODUODENOSCOPY  02/10/2012   Procedure: ESOPHAGOGASTRODUODENOSCOPY (EGD);  Surgeon: Cleotis Nipper, MD;  Location: [facility] ENDOSCOPY;  Service: Endoscopy;  Laterality: N/A;  .  ESOPHAGOGASTRODUODENOSCOPY  06/29/2012   Procedure: ESOPHAGOGASTRODUODENOSCOPY (EGD);  Surgeon: Winfield Cunas., MD;  Location: [facility];  Service: Endoscopy;  Laterality: N/A;  . FOREIGN BODY REMOVAL  02/10/2012   Procedure: FOREIGN BODY REMOVAL;  Surgeon: Cleotis Nipper, MD;  Location: [facility];  Service: Endoscopy;  Laterality: N/A;  . FOREIGN BODY REMOVAL  06/29/2012   Procedure: FOREIGN BODY REMOVAL;  Surgeon: Winfield Cunas., MD;  Location: [facility] ENDOSCOPY;  Service: Endoscopy;  Laterality: N/A;  . LEFT HEART CATHETERIZATION WITH CORONARY ANGIOGRAM N/A 04/02/2012   Procedure: LEFT HEART CATHETERIZATION WITH CORONARY ANGIOGRAM;  Surgeon: Sinclair Grooms, MD;  Location: [facility] CATH LAB;  Service: Cardiovascular;  Laterality: N/A;  . LOOP RECORDER IMPLANT N/A 11/17/2013   Procedure: LOOP RECORDER IMPLANT;  Surgeon: Deboraha Sprang, MD;  Location: [facility] CATH LAB;  Service: Cardiovascular;  Laterality: N/A;  . ORCHIECTOMY    . PARTIAL COLECTOMY     for diverticuli  . SAVORY DILATION  06/29/2012   Procedure: SAVORY DILATION;  Surgeon: Winfield Cunas., MD;  Location: [facility]OS[facility];  Service: Endoscopy;  Laterality: N/A;  . TEE WITHOUT CARDIOVERSION N/A 11/17/2013   Procedure: TRANSESOPHAGEAL ECHOCARDIOGRAM (TEE);  Surgeon: Larey Dresser, MD;  Location: [facility];  Service: Cardiovascular;  Laterality: N/A;  . UMBILICAL HERNIA REPAIR     HPI:  72 yo WM PMHxseizures, depression,CVA; OSA not on CPAP; seizures; HTN; HLD; Dementia; ETOH abuse; DM(diet-controlled); CAD s/p CABG 8309; chronic diastolic CHF 4076, atrial flutter, AKI, Diverticulitis s/p partial colectomy, hiatal hernia, PE 2005, nephrolithiasis 04/15/2014. SLP evaluated 11/15/13 with findings of functional oropharygneal swallow without evidence of aspiration. Regular diet with thin liquids was recommended. Noted history of esophageal dysphagia, s/p balloon dilation 10/06/13.    Assessment / Plan / Recommendation Clinical  Impression   Patient presents with likely primary esophageal dysphagia. Upon SLP entry to room, pt is suctioning frothy secretions from oral cavity. His wife reports she was afraid she "choked him" after giving pt ginger ale causing him to cough and regurgitate. Foul odor and thick secretions in oral cavity. After providing oral care, administered diagnostic trials of ice chips, thin water. Pt tolerates ice chips with appearance of adequate oral manipulation, timely swallow and no overt signs of aspiration. With thin liquids, there is immediate eructation and regurgitation of clear, frothy liquid which SLP orally suctioned. Recommend f/u with GI. Discussed with MD, GI. Will follow acutely.   SLP Visit Diagnosis: Dysphagia, unspecified (R13.10)    Aspiration Risk  Mild aspiration risk    Diet Recommendation NPO        Other  Recommendations Recommended Consults: Consider GI evaluation;Consider esophageal assessment Oral Care Recommendations: Oral care QID Other Recommendations: Have oral suction available   Follow up Recommendations Other (comment)(tba)      Frequency and Duration min 1 x/week  1 week       Prognosis Prognosis for Safe Diet Advancement: Fair      Swallow Study   General Date of Onset: 10/04/17 HPI: 72 yo WM PMHxseizures, depression,CVA; OSA not on CPAP; seizures; HTN; HLD; Dementia; ETOH abuse; DM(diet-controlled); CAD s/p CABG 8088; chronic diastolic CHF 1103, atrial flutter, AKI, Diverticulitis s/p partial colectomy, hiatal hernia, PE 2005, nephrolithiasis 04/15/2014. SLP evaluated 11/15/13 with findings of functional oropharygneal swallow without evidence of aspiration. Regular diet with thin liquids was recommended. Noted history of esophageal dysphagia, s/p balloon dilation 10/06/13.  Type of Study: Bedside Swallow Evaluation Previous Swallow Assessment: see HPI Diet Prior to this Study: NPO Temperature Spikes Noted: No Respiratory Status: Nasal  cannula History of Recent Intubation: No Behavior/Cognition: Cooperative;Lethargic/Drowsy;Requires cueing;Distractible Oral Cavity Assessment: Dried secretions Oral Care Completed by SLP: Yes Oral Cavity - Dentition: Missing dentition;Poor condition Vision: Impaired for self-feeding Self-Feeding Abilities: Needs assist Patient Positioning: Upright in bed Baseline Vocal Quality: Normal Volitional Cough: Strong Volitional Swallow: Able to elicit    Oral/Motor/Sensory Function Overall Oral Motor/Sensory Function: Within functional limits(reduced lingual ROM but appears cognitive vs strength)   Ice Chips Ice chips: Within functional limits Presentation: Spoon   Thin Liquid Thin Liquid: Impaired Other Comments: immediate belching and regurgitation    Nectar Thick Nectar Thick Liquid: Not tested   Honey Thick Honey Thick Liquid: Not tested   Puree Puree: Not tested   Solid   GO    Deneise Lever, MS, CCC-SLP Speech-Language Pathologist [phone] Solid: Not tested        Aliene Altes 10/04/2017,4:42 PM

## 2017-10-04 NOTE — Progress Notes (Signed)
ANTICOAGULATION + ANTIBIOTIC CONSULT NOTE - Follow Up Consult  Pharmacy Consult for Heparin; Vancomycin, Aztreonam and Levaquin Indication: chest pain/ACS; sepsis coverage  Patient Measurements: Height: 5' 7"  (170.2 cm) Weight: 211 lb 3.2 oz (95.8 kg) IBW/kg (Calculated) : 66.1 Heparin Dosing Weight: 83 kg  Vital Signs: Temp: 98.8 F (37.1 C) (02/09 0941) Temp Source: Oral (02/09 0941) BP: 126/75 (02/09 0943) Pulse Rate: 63 (02/09 0943)  Labs: Recent Labs    10/02/17 0305 10/02/17 1848 10/03/17 0339 10/03/17 1326 10/04/17 0254 10/04/17 0259  HGB 10.3*  --  9.8*  --  10.5*  --   HCT 33.4*  --  31.6*  --  33.9*  --   PLT 152  --  134*  --  132*  --   HEPARINUNFRC 0.47  --  0.72* 0.36  --  0.64  CREATININE 1.22  --  1.15  --  1.10  --   TROPONINI  --  5.04*  --   --   --   --     Estimated Creatinine Clearance: 66 mL/min (by C-G formula based on SCr of 1.1 mg/dL).  Assessment:   Continues on IV heparin for ACS.      Heparin level remains therapeutic (0.64) on 1100 units/hr.  Hgb stable, platelet count has trended down. No bleeding reported.  Transaminases elevated.    Day # 5 Vancomycin, Aztreonam and Levaquin.  Afebrile, WBC  13.9, LA still up at 2.0.   Antibiotic doses remain appropriate for renal function.    Vanc 2/5>>   Levaquin 2/5>>   Aztreonam 2/5>>  2/5 MRSA PCR - negative 2/5 Respiratory panel - negative 2/5 urine - negative 2/5 blood x 2 - ng x 4 days to date 2/5 Influenza PCR - negative  Goal of Therapy:  Heparin level 0.3-0.7 units/ml Monitor platelets by anticoagulation protocol: Yes Vanc trough levels 15-20 mcg/ml Appropriate antibiotic doses for renal function and infection   Plan:   Continue heparin drip at 1100 units/hr.  Daily heparin level and CBC. Watch platelet count.  Continue Vancomycin 750 mg IV q12hrs.  Will check Vanc trough level on 2/10.  Target Vanc troughs 15-20 mcg/ml.  Continue Aztreonam 1gm IV q8hrs.  Continue  Levaquin 750 mg IV q24hrs.  Follow renal function, final culture data, antibiotic plans.  Hold Atorvastatin?  [name], [place] Pager: [phone] 10/04/2017,11:41 AM

## 2017-10-04 NOTE — Progress Notes (Signed)
Patient placed on CPAP with 3L 02 bleed in. Tolerating well. No issues and patient will call if any further assistance needed.

## 2017-10-04 NOTE — Progress Notes (Signed)
Patient coughs up copious amount of secretions, [employer] suction setup at bedside and instructed patient how to suction his mouth.  Patient demonstrates understanding.

## 2017-10-05 ENCOUNTER — Inpatient Hospital Stay (HOSPITAL_COMMUNITY): Payer: Medicare Other

## 2017-10-05 DIAGNOSIS — K224 Dyskinesia of esophagus: Secondary | ICD-10-CM

## 2017-10-05 LAB — GLUCOSE, CAPILLARY
GLUCOSE-CAPILLARY: 90 mg/dL (ref 65–99)
GLUCOSE-CAPILLARY: 90 mg/dL (ref 65–99)
GLUCOSE-CAPILLARY: 118 mg/dL — AB (ref 65–99)
Glucose-Capillary: 85 mg/dL (ref 65–99)

## 2017-10-05 LAB — CBC
HCT: 31.7 % — ABNORMAL LOW (ref 39.0–52.0)
HEMOGLOBIN: 9.7 g/dL — AB (ref 13.0–17.0)
MCH: 28.5 pg (ref 26.0–34.0)
MCHC: 30.6 g/dL (ref 30.0–36.0)
MCV: 93.2 fL (ref 78.0–100.0)
Platelets: 124 10*3/uL — ABNORMAL LOW (ref 150–400)
RBC: 3.4 MIL/uL — ABNORMAL LOW (ref 4.22–5.81)
RDW: 16.4 % — AB (ref 11.5–15.5)
WBC: 16.5 10*3/uL — AB (ref 4.0–10.5)

## 2017-10-05 LAB — CULTURE, BLOOD (ROUTINE X 2)
CULTURE: NO GROWTH
Culture: NO GROWTH
SPECIAL REQUESTS: ADEQUATE
Special Requests: ADEQUATE

## 2017-10-05 LAB — LIPID PANEL
CHOLESTEROL: 144 mg/dL (ref 0–200)
HDL: 45 mg/dL (ref >40)
LDL Cholesterol: 84 mg/dL (ref 0–99)
TRIGLYCERIDES: 77 mg/dL (ref <150)
Total CHOL/HDL Ratio: 3.2 RATIO
VLDL: 15 mg/dL (ref 0–40)

## 2017-10-05 LAB — BASIC METABOLIC PANEL
Anion gap: 10 (ref 5–15)
BUN: 27 mg/dL — ABNORMAL HIGH (ref 6–20)
CALCIUM: 7.7 mg/dL — AB (ref 8.9–10.3)
CHLORIDE: 102 mmol/L (ref 101–111)
CO2: 23 mmol/L (ref 22–32)
Creatinine, Ser: 0.92 mg/dL (ref 0.61–1.24)
GFR calc non Af Amer: >60 mL/min (ref >60)
Glucose, Bld: 86 mg/dL (ref 65–99)
Potassium: 4.4 mmol/L (ref 3.5–5.1)
SODIUM: 135 mmol/L (ref 135–145)

## 2017-10-05 LAB — VANCOMYCIN, TROUGH: Vancomycin Tr: 11 ug/mL — ABNORMAL LOW (ref 15–20)

## 2017-10-05 LAB — MAGNESIUM: MAGNESIUM: 2.4 mg/dL (ref 1.7–2.4)

## 2017-10-05 LAB — HEPARIN LEVEL (UNFRACTIONATED): HEPARIN UNFRACTIONATED: 0.53 [IU]/mL (ref 0.30–0.70)

## 2017-10-05 LAB — TROPONIN I: Troponin I: 1.96 ng/mL (ref <0.03)

## 2017-10-05 NOTE — Progress Notes (Signed)
Pt placed on CPAP for the night with 3lpm oxygen- tolerating well.

## 2017-10-05 NOTE — Progress Notes (Signed)
PROGRESS NOTE    [name]  CHE:[id number] DOB: 10-20-1945 DOA: 09/30/2017 PCP: Bernell List, MD   Brief Narrative:  72 yo WM PMHx  Seizures, depression,CVA; OSA not on CPAP; seizures; HTN; HLD; Dementia;  ETOH abuse; DM(diet-controlled); CAD s/p CABG 5361; chronic diastolic CHF 4431, atrial flutter, AKI, Diverticulitis  s/p partial colectomy, hiatal hernia, PE 2005, nephrolithiasis 04/15/2014  Presenting with weakness.  Initially, the patient provided the history and said "I don't know" what brought him in.  He started feeling bad 2-3 years ago.  Started feeling like he does today 2-3 weeks ago.  Can't eat.  No SOB.  Occasional cough, not new, productive of clear sputum.  No fever at home.  +headache x 2-3 years, confusion x 2-3 months.  Hurts left lower back.  No chest pain, palpitations.  No abdominal pain.  No N/V/D/C.   No rash.  +weakness.  Occasional dysphagia.   Based on minimal meaningful history provided by the patient, I called and spoke to his wife by telephone.  He was previously admitted here from 1/1-7 with C. Diff colitis and discharged on oral vancomycin; he has troponin elevation thought to be related to demand ischemia and also had a BIPAP requirement qhs, possibly due to diastolic heart failure.  He left and was sent to [facility] and then he was transferred to the [facility] in [place] for rehab.  His wife brought him home a week ago and he was negative for C diff prior to discharge.  2 days ago he started walking slower and leaning to his left.  Last night, he was unable to stand for his shower.  He got out of the bed overnight and fell onto his buttocks.  His wife was able to get him up and back in bed but then it happened again.  Her grandson was able to pick him up and they placed him in a lift recliner.  He was "confused with his feet."  Prior CVA with left-sided weakness but now with apparent left-sided weakness.  He has been turning his feet inward but he did not understand what  she was saying.  He did not have a fever at home until just as she called EMS.  He is intermittently confused at home.  Yesterday, he was breathing in a pattern that was bothersome to the family - "he sucks air in through his mouth and swallows it and has to burp or pass gas and he did that all day long yesterday."  No cough until today.  ?productive sputum.  Urine with an odor today.  +diarrhea but this is chronic after colonic resection; this has been better than when he had C diff.       ED Course: Sepsis - started on Vanc/Levaquin/Aztreonam.  Uncertain source - flu negative.  CXR and CTA negative.  Urine unremarkable.  Cardiology consulted - no cath while floridly septic, start Heparin, they will follow.    Subjective: 2/10 A/O's 2 (does not know when, why). Negative SOB. Negative abdominal pain. Negative CP. Continues to request food and drink (forgets he is NPO)    Assessment & Plan:   Principal Problem:   Sepsis ([place]) Active Problems:   Diabetes mellitus ([employer])   Obstructive sleep apnea   Essential hypertension   NSTEMI (non-ST elevated myocardial infarction) ([place])   Dementia   Sepsis unspecified organism  -Complete seven-day course antibiotics, except will DC vancomycin.as MRSA negative by PCR and blood cultures negative - 2/9 repeat PCXR: Inconclusive  for aspiration pneumonia, however current antibiotics should cover most organisms. In addition patient afebrile and clinically improving from respiratory status -Recent C. difficile infection (treated): Currently asymptomatic for C. difficile infection. DC precautions -Panculture negative results below -clinically patient appears improved however continued leukocytosis (stress reaction secondary to MI?)   SBO vs/Ileus/ Esophageal stricture/Esophageal dysmotility - NPO; until workup complete -KUB negative for SBO/ileus -Barium swallow: Positive for impacted food/esophageal stricture see results below -On 2/11 contact [facility]  patient will require EGD   NSTEMI -Currently no chest pain however patient extremely fatigued  -Initial EKG with ST depressions concerning for global ischemia -TIMI risk score= 5 -Troponin significantly elevated but trending down continue to monitor Recent Labs  Lab 09/30/17 1519 09/30/17 2125 10/01/17 0302 10/01/17 0807 10/02/17 1848 10/05/17 0426  TROPONINI 13.44* 12.87* 12.00* 9.10* 5.04* 1.96*  -cardiology to perform cardiac catheterization when patient more stable: Early next week? -ASA 325 mg daily -Lipitor 80 mg daily -Heparin drip -Metoprolol 12.5 mg BID -2/6 Echocardiogram; worsening EF when compared to echocardiogram from 08/29/2017 (EF 50-55%) now EF only 20-25%. See results below   Acute on chronic systolic CHF -Strict in and out since admission +7.4 L -Daily weight Filed Weights   10/03/17 0438 10/04/17 8413 10/05/17 0447  Weight: 217 lb 6 oz (98.6 kg) 211 lb 3.2 oz (95.8 kg) 216 lb 14.9 oz (98.4 kg)  -see NSTEMI  Pulmonary hypertension -See NSTEMI  Essential HTN  -unsure of patient's compliance at home with medication -See NSTEMI    Diabetes type 2 uncontrolled with complication -2/[id number] A1c= 7.1 -Moderate SSI  HLD  -Lipid panel not within ADA/AHA guidelines -When patient able to take PO start Lipitor 40 mg daily  Dementia  -Must be early onset able answer all questions appropriately -Continue Aricept, Abilify, Lamictal, and Trazodone   OSA -Not on home CPAP -CPAP per respiratory     DVT prophylaxis: Heparin drip Code Status: Full Family Communication: Wife at bedside spoke at length concerning plan of care. Disposition Plan: TBD   Consultants:  Cardiology    Procedures/Significant Events:  2/6 Echocardiogram:- Left ventricle: - mild LVH. -LVEF = 20% to 25%. - Mitral valve: mild to moderate regurgitation. - Tricuspid valve:  moderate regurgitation. - Pulmonary arteries:  PA peak pressure: 42 mm Hg (S). 2/9 PCXR: Negative  aspiration, pneumonia 2/9 KUB: Negative SBO/ileus 2/9 DG Esophagus:Large retained fluid bolus and possible food impaction noted. This food bolus moved from the upper thoracic esophagus to the lower esophagus during the study. There is poor distention and expansion of the distal esophagus. -Cannot exclude distal esophageal stricture. -Severe dysmotility.     I have personally reviewed and interpreted all radiology studies and my findings are as above.  VENTILATOR SETTINGS:    Cultures 2/5 blood negative 2/5 urine negative 2/5 MRSA by PCR negative 2/5 respiratory virus panel negative   Antimicrobials: Anti-infectives (From admission, onward)   Start     Stop   10/01/17 0800  levofloxacin (LEVAQUIN) IVPB 750 mg         09/30/17 2200  vancomycin (VANCOCIN) IVPB 750 mg/150 ml premix  Status:  Discontinued     10/05/17 1900   09/30/17 1600  aztreonam (AZACTAM) 1 g in dextrose 5 % 50 mL IVPB         09/30/17 0715  levofloxacin (LEVAQUIN) IVPB 750 mg     09/30/17 1059   09/30/17 0715  aztreonam (AZACTAM) 2 g in dextrose 5 % 50 mL IVPB  09/30/17 0900   09/30/17 0715  vancomycin (VANCOCIN) IVPB 1000 mg/200 mL premix  Status:  Discontinued     09/30/17 0706   09/30/17 0715  vancomycin (VANCOCIN) 2,000 mg in sodium chloride 0.9 % 500 mL IVPB     09/30/17 1530       Devices    LINES / TUBES:      Continuous Infusions: . sodium chloride    . sodium chloride 35 mL/hr (10/04/17 0936)  . aztreonam Stopped (10/05/17 0933)  . heparin 1,100 Units/hr (10/05/17 0600)  . levofloxacin (LEVAQUIN) IV 750 mg (10/05/17 1024)  . vancomycin Stopped (10/05/17 0527)     Objective: Vitals:   10/04/17 2231 10/04/17 2300 10/05/17 0447 10/05/17 0825  BP:  118/65 110/63 121/69  Pulse: 63 63 60 (!) 57  Resp: 16 19 15 14   Temp:  97.8 F (36.6 C) 98.7 F (37.1 C)   TempSrc:  Axillary Axillary   SpO2: 94% 96% 95% 98%  Weight:   216 lb 14.9 oz (98.4 kg)   Height:         Intake/Output Summary (Last 24 hours) at 10/05/2017 1234 Last data filed at 10/05/2017 0600 Gross per 24 hour  Intake 1906 ml  Output 825 ml  Net 1081 ml   Filed Weights   10/03/17 0438 10/04/17 6468 10/05/17 0447  Weight: 217 lb 6 oz (98.6 kg) 211 lb 3.2 oz (95.8 kg) 216 lb 14.9 oz (98.4 kg)    Physical Exam:  General:  A/O 2 (does not know when, why), No acute respiratory distress,  weak Neck:  Negative scars, masses, torticollis, lymphadenopathy, JVD Lungs: Clear to auscultation bilaterally without wheezes or crackles Cardiovascular: Regular rhythm and rate without murmur gallop or rub normal S1 and S2 Abdomen: negative abdominal pain, nondistended, positive soft, bowel sounds, no rebound, no ascites, no appreciable mass Extremities: No significant cyanosis, clubbing, or edema bilateral lower extremities Skin: Negative rashes, lesions, ulcers Psychiatric:  Negative depression, negative anxiety, negative fatigue, negative mania  Central nervous system:  Cranial nerves II through XII intact, tongue/uvula midline, all extremities muscle strength 5/5, sensation intact throughout,  negative dysarthria, negative expressive aphasia, negative receptive aphasia.    Data Reviewed: Care during the described time interval was provided by me .  I have reviewed this patient's available data, including medical history, events of note, physical examination, and all test results as part of my evaluation.   CBC: Recent Labs  Lab 09/30/17 0654 10/01/17 0302 10/02/17 0305 10/03/17 0339 10/04/17 0254 10/05/17 0426  WBC 15.5* 14.0* 13.9* 15.1* 13.9* 16.5*  NEUTROABS 12.8*  --   --   --   --   --   HGB 10.9* 11.4* 10.3* 9.8* 10.5* 9.7*  HCT 34.5* 36.8* 33.4* 31.6* 33.9* 31.7*  MCV 92.2 93.4 92.5 92.1 92.4 93.2  PLT 229 169 152 134* 132* 032*   Basic Metabolic Panel: Recent Labs  Lab 09/30/17 1228 10/01/17 0302 10/02/17 0305 10/03/17 0339 10/04/17 0254 10/05/17 0426  NA  --  138  136 134* 133* 135  K  --  4.6 4.2 5.2* 4.3 4.4  CL  --  104 103 103 102 102  CO2  --  22 22 17* 18* 23  GLUCOSE  --  155* 132* 122* 126* 86  BUN  --  33* 39* 37* 32* 27*  CREATININE  --  1.10 1.22 1.15 1.10 0.92  CALCIUM  --  8.4* 8.0* 7.8* 7.9* 7.7*  MG 2.3  --  2.2  2.3 2.3 2.4   GFR: Estimated Creatinine Clearance: 79.9 mL/min (by C-G formula based on SCr of 0.92 mg/dL). Liver Function Tests: Recent Labs  Lab 09/30/17 0654 10/04/17 0254  AST 143* 329*  ALT 81* 403*  ALKPHOS 82 379*  BILITOT 0.5 1.1  PROT 6.9 5.9*  ALBUMIN 3.4* 2.7*   No results for input(s): LIPASE, AMYLASE in the last 168 hours. No results for input(s): AMMONIA in the last 168 hours. Coagulation Profile: Recent Labs  Lab 09/30/17 0818 10/01/17 0302  INR 1.16 1.44   Cardiac Enzymes: Recent Labs  Lab 09/30/17 2125 10/01/17 0302 10/01/17 0807 10/02/17 1848 10/05/17 0426  TROPONINI 12.87* 12.00* 9.10* 5.04* 1.96*   BNP (last 3 results) No results for input(s): PROBNP in the last 8760 hours. HbA1C: No results for input(s): HGBA1C in the last 72 hours. CBG: Recent Labs  Lab 10/04/17 1136 10/04/17 1728 10/04/17 2152 10/05/17 0632 10/05/17 1143  GLUCAP 132* 101* 87 90 90   Lipid Profile: Recent Labs    10/04/17 0254 10/05/17 0426  CHOL 161 144  HDL 51 45  LDLCALC 95 84  TRIG 76 77  CHOLHDL 3.2 3.2   Thyroid Function Tests: No results for input(s): TSH, T4TOTAL, FREET4, T3FREE, THYROIDAB in the last 72 hours. Anemia Panel: No results for input(s): VITAMINB12, FOLATE, FERRITIN, TIBC, IRON, RETICCTPCT in the last 72 hours. Urine analysis:    Component Value Date/Time   COLORURINE YELLOW 09/30/2017 [place] 09/30/2017 1255   LABSPEC >1.046 (H) 09/30/2017 1255   PHURINE 5.0 09/30/2017 1255   GLUCOSEU NEGATIVE 09/30/2017 1255   HGBUR MODERATE (A) 09/30/2017 1255   BILIRUBINUR NEGATIVE 09/30/2017 1255   BILIRUBINUR neg 04/27/2014 1205   KETONESUR NEGATIVE  09/30/2017 1255   PROTEINUR 30 (A) 09/30/2017 1255   UROBILINOGEN 0.2 03/07/2015 1005   NITRITE NEGATIVE 09/30/2017 1255   LEUKOCYTESUR NEGATIVE 09/30/2017 1255   Sepsis Labs: @LABRCNTIP (procalcitonin:4,lacticidven:4)  ) Recent Results (from the past 240 hour(s))  Culture, blood (routine x 2)     Status: None   Collection Time: 09/30/17  7:27 AM  Result Value Ref Range Status   Specimen Description BLOOD LEFT ANTECUBITAL  Final   Special Requests   Final    BOTTLES DRAWN AEROBIC AND ANAEROBIC Blood Culture adequate volume   Culture   Final    NO GROWTH 5 DAYS Performed at [facility], [place] [address] [place], [place] [address]    Report Status 10/05/2017 FINAL  Final  Culture, blood (routine x 2)     Status: None   Collection Time: 09/30/17  7:27 AM  Result Value Ref Range Status   Specimen Description BLOOD RIGHT WRIST  Final   Special Requests   Final    BOTTLES DRAWN AEROBIC AND ANAEROBIC Blood Culture adequate volume   Culture   Final    NO GROWTH 5 DAYS Performed at [facility], [place] [address], [place], [place] [address]    Report Status 10/05/2017 FINAL  Final  Respiratory Panel by PCR     Status: None   Collection Time: 09/30/17  8:54 AM  Result Value Ref Range Status   Adenovirus NOT DETECTED NOT DETECTED Final   Coronavirus 229E NOT DETECTED NOT DETECTED Final   Coronavirus HKU1 NOT DETECTED NOT DETECTED Final   Coronavirus NL63 NOT DETECTED NOT DETECTED Final   Coronavirus OC43 NOT DETECTED NOT DETECTED Final   Metapneumovirus NOT DETECTED NOT DETECTED Final   Rhinovirus / Enterovirus NOT  DETECTED NOT DETECTED Final   Influenza A NOT DETECTED NOT DETECTED Final   Influenza B NOT DETECTED NOT DETECTED Final   Parainfluenza Virus 1 NOT DETECTED NOT DETECTED Final   Parainfluenza Virus 2 NOT DETECTED NOT DETECTED Final   Parainfluenza Virus 3 NOT DETECTED NOT DETECTED Final   Parainfluenza Virus 4 NOT DETECTED NOT DETECTED Final   Respiratory  Syncytial Virus NOT DETECTED NOT DETECTED Final   Bordetella pertussis NOT DETECTED NOT DETECTED Final   Chlamydophila pneumoniae NOT DETECTED NOT DETECTED Final   Mycoplasma pneumoniae NOT DETECTED NOT DETECTED Final    Comment: Performed at [facility], [place] [address], [place], [place] [address]  Urine culture     Status: None   Collection Time: 09/30/17  1:00 PM  Result Value Ref Range Status   Specimen Description URINE, CLEAN CATCH  Final   Special Requests NONE  Final   Culture   Final    NO GROWTH Performed at [facility], [place] [address], [place], [place] [address]    Report Status 10/01/2017 FINAL  Final  MRSA PCR Screening     Status: None   Collection Time: 09/30/17  9:11 PM  Result Value Ref Range Status   MRSA by PCR NEGATIVE NEGATIVE Final    Comment:        The GeneXpert MRSA Assay (FDA approved for NASAL specimens only), is one component of a comprehensive MRSA colonization surveillance program. It is not intended to diagnose MRSA infection nor to guide or monitor treatment for MRSA infections. Performed at [facility], [place] [address], [place], [place] [address]          Radiology Studies: Dg Chest Port 1 View  Result Date: 10/04/2017 CLINICAL DATA:  Aspiration EXAM: PORTABLE CHEST 1 VIEW COMPARISON:  10/01/2017 FINDINGS: Prior CABG. Cardiomegaly. Loop recorder device noted in the left chest wall. No overt edema, confluent opacity or effusion. IMPRESSION: Cardiomegaly.  No active disease. Electronically Signed   By: Rolm Baptise M.D.   On: 10/04/2017 15:13   Dg Abd Portable 1v  Result Date: 10/04/2017 CLINICAL DATA:  Followup small bowel obstruction. EXAM: PORTABLE ABDOMEN - 1 VIEW COMPARISON:  CT 08/27/2017 FINDINGS: There is a paucity of bowel gas. There is no bowel distention to suggest obstruction. Soft tissues are not well-defined. There is a prosthesis reservoir in the right pelvis. Stable changes from a prior L5-S1 posterior lumbar  spine fusion. IMPRESSION: 1. Mostly gasless abdomen. There are no visualized dilated loops of bowel to suggest obstruction. No acute findings. Electronically Signed   By: Lajean Manes M.D.   On: 10/04/2017 17:18        Scheduled Meds: . ARIPiprazole  5 mg Oral Daily  . aspirin  325 mg Oral Daily  . atorvastatin  80 mg Oral Daily  . donepezil  10 mg Oral Daily  . FLUoxetine  40 mg Oral Daily  . insulin aspart  0-15 Units Subcutaneous TID WC  . insulin aspart  0-5 Units Subcutaneous QHS  . lamoTRIgine  100 mg Oral BID  . mouth rinse  15 mL Mouth Rinse BID  . metoprolol tartrate  12.5 mg Oral BID  . potassium citrate  10 mEq Oral BID  . sodium chloride flush  3 mL Intravenous Q12H  . traZODone  100 mg Oral QHS   Continuous Infusions: . sodium chloride    . sodium chloride 35 mL/hr (10/04/17 0936)  . aztreonam Stopped (10/05/17 0933)  .  heparin 1,100 Units/hr (10/05/17 0600)  . levofloxacin (LEVAQUIN) IV 750 mg (10/05/17 1024)  . vancomycin Stopped (10/05/17 0527)     LOS: 5 days    Time spent: 40 minutes    WOODS, Geraldo Docker, MD [facility] Pager [phone]   If 7PM-7AM, please contact night-coverage [email or web address] Password [facility] 10/05/2017, 12:34 PM

## 2017-10-05 NOTE — Progress Notes (Signed)
ANTICOAGULATION + ANTIBIOTIC CONSULT NOTE - Follow Up Consult  Pharmacy Consult for Heparin; Vancomycin, Aztreonam and Levaquin Indication: chest pain/ACS; sepsis coverage  Patient Measurements: Height: 5' 7"  (170.2 cm) Weight: 216 lb 14.9 oz (98.4 kg) IBW/kg (Calculated) : 66.1 Heparin Dosing Weight: 83 kg  Vital Signs: Temp: 98.7 F (37.1 C) (02/10 0447) Temp Source: Axillary (02/10 0447) BP: 121/69 (02/10 0825) Pulse Rate: 57 (02/10 0825)  Labs: Recent Labs    10/02/17 1848  10/03/17 0339 10/03/17 1326 10/04/17 0254 10/04/17 0259 10/05/17 0426  HGB  --    < > 9.8*  --  10.5*  --  9.7*  HCT  --   --  31.6*  --  33.9*  --  31.7*  PLT  --   --  134*  --  132*  --  124*  HEPARINUNFRC  --    < > 0.72* 0.36  --  0.64 0.53  CREATININE  --   --  1.15  --  1.10  --  0.92  TROPONINI 5.04*  --   --   --   --   --  1.96*   < > = values in this interval not displayed.    Estimated Creatinine Clearance: 79.9 mL/min (by C-G formula based on SCr of 0.92 mg/dL).  Assessment:   Continues on IV heparin for ACS.   Has been on Eliquis for atrial fibrillation in the past, but not just prior to admission.  Discontinued 06/2017 due to GI bleed/rectal bleed. Changed to Aspirin 325 mg daily at discharge.      Heparin level remains therapeutic (0.53) on 1100 units/hr.  Hgb stable, platelet count has trended down. No bleeding reported.  Transaminases elevated.    Day # 6 Vancomycin, Aztreonam and Levaquin.  Afebrile, WBC up 16.5, LA still up at 2.0 on 2/9.   Vanc trough level today is 11 mcg/ml, but drawn ~2 hours late.   Antibiotic doses remain appropriate for renal function.    Vanc 2/5>>   Levaquin 2/5>>   Aztreonam 2/5>>  2/10:  Vanc trough level 11 mcg/ml (~2 hrs late) - no changes made  2/5 MRSA PCR - negative 2/5 Respiratory panel - negative 2/5 urine - negative 2/5 blood x 2 - negative 2/5 Influenza PCR - negative  Goal of Therapy:  Heparin level 0.3-0.7 units/ml Monitor  platelets by anticoagulation protocol: Yes Vanc trough levels 15-20 mcg/ml Appropriate antibiotic doses for renal function and infection   Plan:   Continue heparin drip at 1100 units/hr.  Daily heparin level and CBC. Watch platelet count.  Continue Vancomycin 750 mg IV q12hrs.  Target Vanc troughs 15-20 mcg/ml.  Continue Aztreonam 1gm IV q8hrs.  Continue Levaquin 750 mg IV q24hrs.  Follow renal function, progress, and antibiotic plans.  Hold Atorvastatin?  [name], [place] Pager: [phone] 10/05/2017,2:52 PM

## 2017-10-06 LAB — BASIC METABOLIC PANEL
ANION GAP: 11 (ref 5–15)
BUN: 24 mg/dL — ABNORMAL HIGH (ref 6–20)
CHLORIDE: 102 mmol/L (ref 101–111)
CO2: 23 mmol/L (ref 22–32)
Calcium: 7.7 mg/dL — ABNORMAL LOW (ref 8.9–10.3)
Creatinine, Ser: 0.91 mg/dL (ref 0.61–1.24)
GFR calc Af Amer: >60 mL/min (ref >60)
Glucose, Bld: 93 mg/dL (ref 65–99)
POTASSIUM: 4.2 mmol/L (ref 3.5–5.1)
Sodium: 136 mmol/L (ref 135–145)

## 2017-10-06 LAB — CBC
HEMATOCRIT: 31.5 % — AB (ref 39.0–52.0)
HEMOGLOBIN: 9.6 g/dL — AB (ref 13.0–17.0)
MCH: 28.7 pg (ref 26.0–34.0)
MCHC: 30.5 g/dL (ref 30.0–36.0)
MCV: 94.3 fL (ref 78.0–100.0)
PLATELETS: 120 10*3/uL — AB (ref 150–400)
RBC: 3.34 MIL/uL — AB (ref 4.22–5.81)
RDW: 16.6 % — ABNORMAL HIGH (ref 11.5–15.5)
WBC: 13.1 10*3/uL — AB (ref 4.0–10.5)

## 2017-10-06 LAB — GLUCOSE, CAPILLARY
GLUCOSE-CAPILLARY: 97 mg/dL (ref 65–99)
GLUCOSE-CAPILLARY: 105 mg/dL — AB (ref 65–99)
Glucose-Capillary: 84 mg/dL (ref 65–99)
Glucose-Capillary: 87 mg/dL (ref 65–99)
Glucose-Capillary: 73 mg/dL (ref 65–99)
Glucose-Capillary: 72 mg/dL (ref 65–99)

## 2017-10-06 LAB — MAGNESIUM: Magnesium: 2.4 mg/dL (ref 1.7–2.4)

## 2017-10-06 LAB — HEPARIN LEVEL (UNFRACTIONATED): Heparin Unfractionated: 0.41 IU/mL (ref 0.30–0.70)

## 2017-10-06 MED ORDER — FUROSEMIDE 20 MG PO TABS: 20.0000 mg | ORAL_TABLET | Freq: Every day | ORAL | Status: DC

## 2017-10-06 MED ORDER — CARVEDILOL 3.125 MG PO TABS
3.1250 mg | ORAL_TABLET | Freq: Two times a day (BID) | ORAL | Status: DC
  Administered 2017-10-08 – 2017-10-11 (×7): 3.125 mg via ORAL
  Filled 2017-10-06 (×7): qty 1

## 2017-10-06 MED ORDER — LOSARTAN POTASSIUM 25 MG PO TABS
25.0000 mg | ORAL_TABLET | Freq: Every day | ORAL | Status: DC
  Administered 2017-10-08 – 2017-10-11 (×4): 25 mg via ORAL
  Filled 2017-10-06 (×4): qty 1

## 2017-10-06 MED ORDER — SODIUM CHLORIDE 0.9 % IV SOLN
1.0000 g | Freq: Three times a day (TID) | INTRAVENOUS | Status: DC
  Filled 2017-10-06 (×2): qty 1

## 2017-10-06 NOTE — Progress Notes (Signed)
Progress Note  Patient Name: [name] Date of Encounter: 10/06/2017  Primary Cardiologist: Peter Martinique, MD   Subjective   On CPAP; no chest pain or dyspnea  Inpatient Medications    Scheduled Meds: . ARIPiprazole  5 mg Oral Daily  . aspirin  325 mg Oral Daily  . atorvastatin  80 mg Oral Daily  . donepezil  10 mg Oral Daily  . FLUoxetine  40 mg Oral Daily  . insulin aspart  0-15 Units Subcutaneous TID WC  . insulin aspart  0-5 Units Subcutaneous QHS  . lamoTRIgine  100 mg Oral BID  . mouth rinse  15 mL Mouth Rinse BID  . metoprolol tartrate  12.5 mg Oral BID  . potassium citrate  10 mEq Oral BID  . sodium chloride flush  3 mL Intravenous Q12H  . traZODone  100 mg Oral QHS   Continuous Infusions: . sodium chloride    . sodium chloride 35 mL/hr (10/04/17 0936)  . aztreonam    . heparin 1,100 Units/hr (10/05/17 2056)  . levofloxacin (LEVAQUIN) IV Stopped (10/05/17 1236)   PRN Meds: sodium chloride, acetaminophen, fluticasone, nitroGLYCERIN, ondansetron (ZOFRAN) IV, sodium chloride flush   Vital Signs    Vitals:   10/05/17 2056 10/05/17 2354 10/06/17 0331 10/06/17 0802  BP: (!) 111/56 114/66 124/75 113/70  Pulse: 64 63 62 (!) 56  Resp: 17 20 20 10   Temp: 97.8 F (36.6 C) 97.7 F (36.5 C) (!) 97.5 F (36.4 C)   TempSrc: Oral Axillary Axillary   SpO2: 94% 94% 92% 97%  Weight:   222 lb 0.1 oz (100.7 kg)   Height:        Intake/Output Summary (Last 24 hours) at 10/06/2017 0906 Last data filed at 10/06/2017 0732 Gross per 24 hour  Intake -  Output 1125 ml  Net -1125 ml   Filed Weights   10/04/17 5102 10/05/17 0447 10/06/17 0331  Weight: 211 lb 3.2 oz (95.8 kg) 216 lb 14.9 oz (98.4 kg) 222 lb 0.1 oz (100.7 kg)    Telemetry    NSR - Personally Reviewed  Physical Exam   GEN: Obese No acute distress Neck: No JVD Cardiac: RRR Respiratory: Clear to auscultation bilaterally. GI: Soft, nontender, non-distended, no masses MS: 1+ edema Neuro: Grossly  intact Psych: Flat affect  Labs    Chemistry Recent Labs  Lab 09/30/17 0654  10/04/17 0254 10/05/17 0426 10/06/17 0335  NA 140   < > 133* 135 136  K 4.8   < > 4.3 4.4 4.2  CL 103   < > 102 102 102  CO2 23   < > 18* 23 23  GLUCOSE 171*   < > 126* 86 93  BUN 38*   < > 32* 27* 24*  CREATININE 1.32*   < > 1.10 0.92 0.91  CALCIUM 9.2   < > 7.9* 7.7* 7.7*  PROT 6.9  --  5.9*  --   --   ALBUMIN 3.4*  --  2.7*  --   --   AST 143*  --  329*  --   --   ALT 81*  --  403*  --   --   ALKPHOS 82  --  379*  --   --   BILITOT 0.5  --  1.1  --   --   GFRNONAA 52*   < > >60 >60 >60  GFRAA >60   < > >60 >60 >60  ANIONGAP 14   < >  13 10 11    < > = values in this interval not displayed.     Hematology Recent Labs  Lab 10/04/17 0254 10/05/17 0426 10/06/17 0335  WBC 13.9* 16.5* 13.1*  RBC 3.67* 3.40* 3.34*  HGB 10.5* 9.7* 9.6*  HCT 33.9* 31.7* 31.5*  MCV 92.4 93.2 94.3  MCH 28.6 28.5 28.7  MCHC 31.0 30.6 30.5  RDW 15.9* 16.4* 16.6*  PLT 132* 124* 120*    Cardiac Enzymes Recent Labs  Lab 10/01/17 0302 10/01/17 0807 10/02/17 1848 10/05/17 0426  TROPONINI 12.00* 9.10* 5.04* 1.96*    Recent Labs  Lab 09/30/17 0700  TROPIPOC 13.14*     Radiology    Dg Esophagus  Result Date: 10/05/2017 CLINICAL DATA:  Esophageal dysmotility EXAM: ESOPHOGRAM/BARIUM SWALLOW TECHNIQUE: Single contrast examination was performed using  thin barium. FLUOROSCOPY TIME:  Fluoroscopy Time:  3 min 6 sec Radiation Exposure Index (if provided by the fluoroscopic device): Number of Acquired Spot Images: 0 COMPARISON:  None. FINDINGS: Fluoroscopic evaluation of swallowing demonstrates severe dysmotility with loss of all primary esophageal peristaltic waves and extensive tertiary contractions throughout the esophagus. Initial imaging in the upper esophageal region demonstrates lobular soft tissue filling defect along the right lateral wall of the upper thoracic esophagus near the thoracic inlet. On subsequent  imaging, this moves distally compatible with retained large food bolus. This food bolus and the 13 mm barium tablet sticks in the mid to distal esophagus. The distal esophagus never distends well. Cannot exclude distal esophageal stricture. IMPRESSION: Large retained fluid bolus and possible food impaction noted. This food bolus moved from the upper thoracic esophagus to the lower esophagus during the study. There is poor distention and expansion of the distal esophagus. Cannot exclude distal esophageal stricture. Severe dysmotility. Electronically Signed   By: Rolm Baptise M.D.   On: 10/05/2017 13:24   Dg Chest Port 1 View  Result Date: 10/04/2017 CLINICAL DATA:  Aspiration EXAM: PORTABLE CHEST 1 VIEW COMPARISON:  10/01/2017 FINDINGS: Prior CABG. Cardiomegaly. Loop recorder device noted in the left chest wall. No overt edema, confluent opacity or effusion. IMPRESSION: Cardiomegaly.  No active disease. Electronically Signed   By: Rolm Baptise M.D.   On: 10/04/2017 15:13   Dg Abd Portable 1v  Result Date: 10/04/2017 CLINICAL DATA:  Followup small bowel obstruction. EXAM: PORTABLE ABDOMEN - 1 VIEW COMPARISON:  CT 08/27/2017 FINDINGS: There is a paucity of bowel gas. There is no bowel distention to suggest obstruction. Soft tissues are not well-defined. There is a prosthesis reservoir in the right pelvis. Stable changes from a prior L5-S1 posterior lumbar spine fusion. IMPRESSION: 1. Mostly gasless abdomen. There are no visualized dilated loops of bowel to suggest obstruction. No acute findings. Electronically Signed   By: Lajean Manes M.D.   On: 10/04/2017 17:18    Patient Profile     72 y.o. 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  Patient ruled in for a non-ST elevation myocardial  infarction and echocardiogram shows newly reduced LV function with ejection fraction 25%.  Assessment & Plan    1 NSTEMI: Patient denies chest pain.  Continue aspirin, heparin, statin and beta-blocker.  He remains weak and needs significant rehabilitation.  We can consider catheterization if he improves physically.  If he does not improve best option may be medical therapy.  2  Acute systolicheart failure: Patient is volume overloaded on examination.  We will add Lasix 20 mg daily.  Follow renal function closely.  Add Cozaar 25 mg daily for reduced LV function.  Change metoprolol to carvedilol 3.125 mg twice daily.  3 Sepsis-per IM.  4 H/O PE-will change heparin to apixaban when it is clear all procedures complete.  For questions or updates, please contact [place] Please consult [email or web address] for contact info under Cardiology/STEMI.      Signed, Kirk Ruths, MD  10/06/2017, 9:06 AM

## 2017-10-06 NOTE — Progress Notes (Signed)
Patient placed on CPAP with 2L O2 bled in, tolerating well, no distress noted RCP will continue to monitor.

## 2017-10-06 NOTE — Progress Notes (Signed)
Pt tolerating ice chips fair. When attempting water via a spoon, pt noted to cough, then complain of sick feeling on stomach. Pt and wife informed that we would stick to ice chips tonight. Suction within reach. Call bell and phone within reach. Will continue to monitor.

## 2017-10-06 NOTE — Progress Notes (Signed)
[place] TEAM 1 - Stepdown/ICU TEAM  [name]  [name] DOB: 1946/08/03 DOA: 09/30/2017 PCP: Bernell List, MD    Brief Narrative:  72 yo M w/ a Hx of Seizures, depression,CVA, OSA not on CPAP, HTN, HLD, Dementia, ETOH abuse, DM (diet-controlled), CAD s/p CABG 2005. chronic diastolic CHF, atrial flutter, Diverticulitis s/p partial colectomy, hiatal hernia, PE 2005, nephrolithiasis, and C diff colitis requiring hospital admit 1/1 > 09/01/2017 who presented with weakness.  Workup in the ED suggested SIRS v/s sepsis, with no clear source.  Flu negative. CXR and CTa chest  negative. Urine unremarkable.   Significant Events: 2/5 admit   Subjective: Resting comfortably in bed.  C/o severe hunger.  Feels that things are sticking in his throat.  Denies SSCP, nausea, sob, or fever.  C/o ongoing L lateral back pain.    Assessment & Plan:  SIRS v/w Sepsis due to unknown source Has completed a seven-day course antibiotics - no clear infectious source - stop abx today - follow clinically   Esophageal stricture / Esophageal dysmotility Barium swallow positive for impacted food/possible esophageal stricture - pt states has had multiple esophageal dilations in the past and was told he was not a candidate for further dilations due to a high risk of perforation - GI consulted - trial of ice chips only for now   NSTEMI Initial EKG with ST depressions concerning for ischemia - Troponin significantly elevated but trending down - Cardiology to perform cardiac catheterization when patient more stable  Acute on chronic systolic CHF TTE 2/6 noted EF 20-25% compared to 08/29/2017 EF 50-55% - lasix being dosed per Cards   Mild transaminitis Unclear etiology - f/u in AM   HTN  BP controlled at this time   DM2 2/5 A1c 7.1 - CBG well control`led at this time   HLD  To begin lipitor when cleared for oral intake   Dementia  continue Aricept, Abilify, Lamictal, and Trazodone  OSA Not on  home CPAP  DVT prophylaxis: IV heparin  Code Status: FULL CODE Family Communication: spoke w/ wife and daughter at bedside   Disposition Plan: SDU   Consultants:  Cardiology  [facility]  Antimicrobials:  Aztreonam 2/5 > 2/11 Levaquin 2/5 > 2/11 Vanc 2/5 > 2/10  Objective: Blood pressure 114/60, pulse (!) 56, temperature (!) 97.5 F (36.4 C), temperature source Axillary, resp. rate 20, height 5' 7"  (1.702 m), weight 100.7 kg (222 lb 0.1 oz), SpO2 97 %.  Intake/Output Summary (Last 24 hours) at 10/06/2017 1353 Last data filed at 10/06/2017 0732 Gross per 24 hour  Intake -  Output 1125 ml  Net -1125 ml   Filed Weights   10/04/17 9924 10/05/17 0447 10/06/17 0331  Weight: 95.8 kg (211 lb 3.2 oz) 98.4 kg (216 lb 14.9 oz) 100.7 kg (222 lb 0.1 oz)    Examination: General: No acute respiratory distress Lungs: poor air movement B bases - no wheezing  Cardiovascular: distant HS - RRR - no M or rub  Abdomen: Nontender, obese, soft, bowel sounds positive, no rebound, no ascites, no appreciable mass Extremities: 1+ B LE edema   CBC: Recent Labs  Lab 09/30/17 0654  10/02/17 0305 10/03/17 0339 10/04/17 0254 10/05/17 0426 10/06/17 0335  WBC 15.5*   < > 13.9* 15.1* 13.9* 16.5* 13.1*  NEUTROABS 12.8*  --   --   --   --   --   --   HGB 10.9*   < > 10.3* 9.8* 10.5* 9.7* 9.6*  HCT 34.5*   < > 33.4* 31.6* 33.9* 31.7* 31.5*  MCV 92.2   < > 92.5 92.1 92.4 93.2 94.3  PLT 229   < > 152 134* 132* 124* 120*   < > = values in this interval not displayed.   Basic Metabolic Panel: Recent Labs  Lab 10/02/17 0305 10/03/17 0339 10/04/17 0254 10/05/17 0426 10/06/17 0335  NA 136 134* 133* 135 136  K 4.2 5.2* 4.3 4.4 4.2  CL 103 103 102 102 102  CO2 22 17* 18* 23 23  GLUCOSE 132* 122* 126* 86 93  BUN 39* 37* 32* 27* 24*  CREATININE 1.22 1.15 1.10 0.92 0.91  CALCIUM 8.0* 7.8* 7.9* 7.7* 7.7*  MG 2.2 2.3 2.3 2.4 2.4   GFR: Estimated Creatinine Clearance: 81.7 mL/min (by C-G formula  based on SCr of 0.91 mg/dL).  Liver Function Tests: Recent Labs  Lab 09/30/17 0654 10/04/17 0254  AST 143* 329*  ALT 81* 403*  ALKPHOS 82 379*  BILITOT 0.5 1.1  PROT 6.9 5.9*  ALBUMIN 3.4* 2.7*    Coagulation Profile: Recent Labs  Lab 09/30/17 0818 10/01/17 0302  INR 1.16 1.44    Cardiac Enzymes: Recent Labs  Lab 09/30/17 2125 10/01/17 0302 10/01/17 0807 10/02/17 1848 10/05/17 0426  TROPONINI 12.87* 12.00* 9.10* 5.04* 1.96*    HbA1C: Hgb A1c MFr Bld  Date/Time Value Ref Range Status  09/30/2017 09:25 PM 7.1 (H) 4.8 - 5.6 % Final    Comment:    (NOTE) Pre diabetes:          5.7%-6.4% Diabetes:              >6.4% Glycemic control for   <7.0% adults with diabetes   04/24/2017 08:20 PM 7.5 (H) 4.8 - 5.6 % Final    Comment:    (NOTE) Pre diabetes:          5.7%-6.4% Diabetes:              >6.4% Glycemic control for   <7.0% adults with diabetes     CBG: Recent Labs  Lab 10/05/17 1652 10/05/17 2235 10/06/17 0344 10/06/17 0916 10/06/17 1218  GLUCAP 118* 85 84 87 97    Recent Results (from the past 240 hour(s))  Culture, blood (routine x 2)     Status: None   Collection Time: 09/30/17  7:27 AM  Result Value Ref Range Status   Specimen Description BLOOD LEFT ANTECUBITAL  Final   Special Requests   Final    BOTTLES DRAWN AEROBIC AND ANAEROBIC Blood Culture adequate volume   Culture   Final    NO GROWTH 5 DAYS Performed at [facility], [place] [address], [place], [place] [address]    Report Status 10/05/2017 FINAL  Final  Culture, blood (routine x 2)     Status: None   Collection Time: 09/30/17  7:27 AM  Result Value Ref Range Status   Specimen Description BLOOD RIGHT WRIST  Final   Special Requests   Final    BOTTLES DRAWN AEROBIC AND ANAEROBIC Blood Culture adequate volume   Culture   Final    NO GROWTH 5 DAYS Performed at [facility], [place] [address], [place], [place] [address]    Report Status 10/05/2017 FINAL  Final    Respiratory Panel by PCR     Status: None   Collection Time: 09/30/17  8:54 AM  Result Value Ref Range Status   Adenovirus NOT DETECTED NOT DETECTED Final  Coronavirus 229E NOT DETECTED NOT DETECTED Final   Coronavirus HKU1 NOT DETECTED NOT DETECTED Final   Coronavirus NL63 NOT DETECTED NOT DETECTED Final   Coronavirus OC43 NOT DETECTED NOT DETECTED Final   Metapneumovirus NOT DETECTED NOT DETECTED Final   Rhinovirus / Enterovirus NOT DETECTED NOT DETECTED Final   Influenza A NOT DETECTED NOT DETECTED Final   Influenza B NOT DETECTED NOT DETECTED Final   Parainfluenza Virus 1 NOT DETECTED NOT DETECTED Final   Parainfluenza Virus 2 NOT DETECTED NOT DETECTED Final   Parainfluenza Virus 3 NOT DETECTED NOT DETECTED Final   Parainfluenza Virus 4 NOT DETECTED NOT DETECTED Final   Respiratory Syncytial Virus NOT DETECTED NOT DETECTED Final   Bordetella pertussis NOT DETECTED NOT DETECTED Final   Chlamydophila pneumoniae NOT DETECTED NOT DETECTED Final   Mycoplasma pneumoniae NOT DETECTED NOT DETECTED Final    Comment: Performed at [facility], [place] [address] [place], [place] [address]  Urine culture     Status: None   Collection Time: 09/30/17  1:00 PM  Result Value Ref Range Status   Specimen Description URINE, CLEAN CATCH  Final   Special Requests NONE  Final   Culture   Final    NO GROWTH Performed at [facility], [place] [address] [place], [place] [address]    Report Status 10/01/2017 FINAL  Final  MRSA PCR Screening     Status: None   Collection Time: 09/30/17  9:11 PM  Result Value Ref Range Status   MRSA by PCR NEGATIVE NEGATIVE Final    Comment:        The GeneXpert MRSA Assay (FDA approved for NASAL specimens only), is one component of a comprehensive MRSA colonization surveillance program. It is not intended to diagnose MRSA infection nor to guide or monitor treatment for MRSA infections. Performed at [facility], [place] [address]  [place], [place] [address]      Scheduled Meds: . ARIPiprazole  5 mg Oral Daily  . aspirin  325 mg Oral Daily  . atorvastatin  80 mg Oral Daily  . carvedilol  3.125 mg Oral BID WC  . donepezil  10 mg Oral Daily  . FLUoxetine  40 mg Oral Daily  . furosemide  20 mg Oral Daily  . insulin aspart  0-15 Units Subcutaneous TID WC  . insulin aspart  0-5 Units Subcutaneous QHS  . lamoTRIgine  100 mg Oral BID  . losartan  25 mg Oral Daily  . mouth rinse  15 mL Mouth Rinse BID  . potassium citrate  10 mEq Oral BID  . sodium chloride flush  3 mL Intravenous Q12H  . traZODone  100 mg Oral QHS     LOS: 6 days   Cherene Altes, MD [facility] Office  [phone] Pager - Text Page per Shea Evans as per below:  On-Call/Text Page:      [email or web address]      password [email or web address]  If 7PM-7AM, please contact night-coverage [email or web address] Password [email or web address] 10/06/2017, 1:53 PM

## 2017-10-06 NOTE — Progress Notes (Addendum)
ANTICOAGULATION CONSULT NOTE - Follow Up Consult  Pharmacy Consult for Heparin Indication: chest pain/ACS  Patient Measurements: Height: 5' 7"  (170.2 cm) Weight: 222 lb 0.1 oz (100.7 kg) IBW/kg (Calculated) : 66.1 Heparin Dosing Weight: 83 kg  Vital Signs: Temp: 97.5 F (36.4 C) (02/11 0331) Temp Source: Axillary (02/11 0331) BP: 113/70 (02/11 0802) Pulse Rate: 56 (02/11 0802)  Labs: Recent Labs    10/04/17 0254 10/04/17 0259 10/05/17 0426 10/06/17 0335  HGB 10.5*  --  9.7* 9.6*  HCT 33.9*  --  31.7* 31.5*  PLT 132*  --  124* 120*  HEPARINUNFRC  --  0.64 0.53 0.41  CREATININE 1.10  --  0.92 0.91  TROPONINI  --   --  1.96*  --     Estimated Creatinine Clearance: 81.7 mL/min (by C-G formula based on SCr of 0.91 mg/dL).  Assessment: 19 yom continuing on IV heparin for ACS. Pt has been on Eliquis for hx of PE in the past, but was discontinued 06/2017 due to GI bleed/rectal bleed and changed to Aspirin 325 mg daily.    Heparin level remains therapeutic. Hgb low but stable, platelet count has trended down to 120. No bleeding documented.  Goal of Therapy:  Heparin level 0.3-0.7 units/ml Monitor platelets by anticoagulation protocol: Yes   Plan:  Continue heparin drip at 1100 units/hr. Monitor daily heparin level and CBC, s/sx bleeding F/u Cardiology plans   Elicia Lamp, PharmD, BCPS Clinical Pharmacist Clinical phone for 10/06/2017 until 3:30pm: [phone] If after 3:30pm, please call main pharmacy at: [phone] 10/06/2017 9:37 AM

## 2017-10-07 ENCOUNTER — Inpatient Hospital Stay (HOSPITAL_COMMUNITY): Payer: Medicare Other

## 2017-10-07 DIAGNOSIS — T17890D Other foreign object in other parts of respiratory tract causing asphyxiation, subsequent encounter: Secondary | ICD-10-CM

## 2017-10-07 LAB — COMPREHENSIVE METABOLIC PANEL
ALT: 393 U/L — AB (ref 17–63)
AST: 209 U/L — AB (ref 15–41)
Albumin: 2.3 g/dL — ABNORMAL LOW (ref 3.5–5.0)
Alkaline Phosphatase: 301 U/L — ABNORMAL HIGH (ref 38–126)
Anion gap: 10 (ref 5–15)
BUN: 20 mg/dL (ref 6–20)
CHLORIDE: 104 mmol/L (ref 101–111)
CO2: 26 mmol/L (ref 22–32)
CREATININE: 0.78 mg/dL (ref 0.61–1.24)
Calcium: 7.8 mg/dL — ABNORMAL LOW (ref 8.9–10.3)
GFR calc non Af Amer: >60 mL/min (ref >60)
Glucose, Bld: 95 mg/dL (ref 65–99)
POTASSIUM: 4 mmol/L (ref 3.5–5.1)
SODIUM: 140 mmol/L (ref 135–145)
Total Bilirubin: 0.7 mg/dL (ref 0.3–1.2)
Total Protein: 5.2 g/dL — ABNORMAL LOW (ref 6.5–8.1)

## 2017-10-07 LAB — CBC
HEMATOCRIT: 32.6 % — AB (ref 39.0–52.0)
Hemoglobin: 9.9 g/dL — ABNORMAL LOW (ref 13.0–17.0)
MCH: 28.3 pg (ref 26.0–34.0)
MCHC: 30.4 g/dL (ref 30.0–36.0)
MCV: 93.1 fL (ref 78.0–100.0)
PLATELETS: 129 10*3/uL — AB (ref 150–400)
RBC: 3.5 MIL/uL — AB (ref 4.22–5.81)
RDW: 16.5 % — ABNORMAL HIGH (ref 11.5–15.5)
WBC: 12.1 10*3/uL — AB (ref 4.0–10.5)

## 2017-10-07 LAB — GLUCOSE, CAPILLARY
GLUCOSE-CAPILLARY: 87 mg/dL (ref 65–99)
GLUCOSE-CAPILLARY: 87 mg/dL (ref 65–99)
GLUCOSE-CAPILLARY: 88 mg/dL (ref 65–99)
Glucose-Capillary: 105 mg/dL — ABNORMAL HIGH (ref 65–99)

## 2017-10-07 LAB — HEPARIN LEVEL (UNFRACTIONATED): HEPARIN UNFRACTIONATED: 0.27 [IU]/mL — AB (ref 0.30–0.70)

## 2017-10-07 LAB — PROTIME-INR
INR: 1.36
Prothrombin Time: 16.6 seconds — ABNORMAL HIGH (ref 11.4–15.2)

## 2017-10-07 MED ORDER — FUROSEMIDE 40 MG PO TABS: 40.0000 mg | ORAL_TABLET | Freq: Every day | ORAL | Status: DC

## 2017-10-07 MED ORDER — NITROGLYCERIN 0.4 MG SL SUBL: 0.4000 mg | SUBLINGUAL_TABLET | SUBLINGUAL | Status: DC | PRN

## 2017-10-07 MED ORDER — SPIRONOLACTONE 25 MG PO TABS
25.0000 mg | ORAL_TABLET | Freq: Every day | ORAL | Status: DC
  Administered 2017-10-08 – 2017-10-11 (×4): 25 mg via ORAL
  Filled 2017-10-07 (×4): qty 1

## 2017-10-07 MED ORDER — HEPARIN SODIUM (PORCINE) 5000 UNIT/ML IJ SOLN
5000.0000 [IU] | Freq: Three times a day (TID) | INTRAMUSCULAR | Status: DC
  Administered 2017-10-07 – 2017-10-09 (×8): 5000 [IU] via SUBCUTANEOUS
  Filled 2017-10-07 (×8): qty 1

## 2017-10-07 MED ORDER — NITROGLYCERIN 0.4 MG SL SUBL
0.4000 mg | SUBLINGUAL_TABLET | SUBLINGUAL | Status: DC | PRN
Start: 2017-10-07 — End: 2017-10-11

## 2017-10-07 NOTE — Progress Notes (Signed)
[place] TEAM 1 - Stepdown/ICU TEAM  [name]  GYK:[id number] DOB: 03-08-1946 DOA: 09/30/2017 PCP: Bernell List, MD    Brief Narrative:  72 yo M w/ a Hx of Seizures, depression,CVA, OSA not on CPAP, HTN, HLD, Dementia, ETOH abuse, DM (diet-controlled), CAD s/p CABG 2005. chronic diastolic CHF, atrial flutter, Diverticulitis s/p partial colectomy, hiatal hernia, PE 2005, nephrolithiasis, and C diff colitis requiring hospital admit 1/1 > 09/01/2017 who presented with weakness.  Workup in the ED suggested SIRS v/s sepsis, with no clear source.  Flu negative. CXR and CTa chest  negative. Urine unremarkable.   Significant Events: 2/5 admit   Subjective: Pt has been c/o some sob intermittently th/o the day, and also reports generalized lethargy.  His RN notes that he is having difficulty w/ coughing even w/ ice chips occasionally.    Assessment & Plan:  SIRS v/w Sepsis due to unknown source Has completed a seven-day course antibiotics - no clear infectious source - stopped abx 2/11 - suspect this represented SIRS in the setting of aspiration pneumonitis   Esophageal stricture / Esophageal dysmotility Barium swallow positive for impacted food/possible esophageal stricture - pt states has had multiple esophageal dilations in the past and was told he was not a candidate for further dilations due to a high risk of perforation - GI now following - to have repeat barium esophagram today - trial of ice chips only at this time   NSTEMI Initial EKG with ST depressions concerning for ischemia - Troponin significantly elevated but trending down - Cardiology to perform cardiac catheterization when patient more stable  Acute on chronic systolic CHF TTE 2/6 noted EF 20-25% compared to 08/29/2017 EF 50-55% - lasix being dosed per Cards, with increase in dose today and addition of aldactone    Mild transaminitis Unclear etiology - ?mild shock liver - slowly improving - acute hepatitis panel pending     HTN  BP controlled at this time   DM2 2/5 A1c 7.1 - CBG well controlled   HLD  Resume lipitor once LFTs normalize  Dementia  continue Aricept, Abilify, Lamictal, and Trazodone - appears clinically to be mild/mod  OSA Not on home CPAP  DVT prophylaxis: SQ heparin  Code Status: FULL CODE Family Communication: spoke w/ daughter at bedside   Disposition Plan: SDU   Consultants:  Cardiology  [facility]  Antimicrobials:  Aztreonam 2/5 > 2/11 Levaquin 2/5 > 2/11 Vanc 2/5 > 2/10  Objective: Blood pressure 122/67, pulse 72, temperature 98 F (36.7 C), temperature source Oral, resp. rate 20, height 5' 7"  (1.702 m), weight 96.6 kg (212 lb 15.4 oz), SpO2 97 %.  Intake/Output Summary (Last 24 hours) at 10/07/2017 1352 Last data filed at 10/07/2017 0800 Gross per 24 hour  Intake 0 ml  Output 940 ml  Net -940 ml   Filed Weights   10/05/17 0447 10/06/17 0331 10/07/17 0314  Weight: 98.4 kg (216 lb 14.9 oz) 100.7 kg (222 lb 0.1 oz) 96.6 kg (212 lb 15.4 oz)    Examination: General: No acute respiratory distress despite reports of SOB  Lungs: poor air movement B bases w/o wheezing  Cardiovascular: distant HS - RRR  Abdomen: Nontender, obese, soft, BS+, no rebound  Extremities: 1+ B LE edema w/o signif change   CBC: Recent Labs  Lab 10/03/17 0339 10/04/17 0254 10/05/17 0426 10/06/17 0335 10/07/17 0305  WBC 15.1* 13.9* 16.5* 13.1* 12.1*  HGB 9.8* 10.5* 9.7* 9.6* 9.9*  HCT 31.6* 33.9* 31.7* 31.5* 32.6*  MCV 92.1 92.4 93.2 94.3 93.1  PLT 134* 132* 124* 120* 803*   Basic Metabolic Panel: Recent Labs  Lab 10/02/17 0305 10/03/17 0339 10/04/17 0254 10/05/17 0426 10/06/17 0335 10/07/17 0305  NA 136 134* 133* 135 136 140  K 4.2 5.2* 4.3 4.4 4.2 4.0  CL 103 103 102 102 102 104  CO2 22 17* 18* 23 23 26   GLUCOSE 132* 122* 126* 86 93 95  BUN 39* 37* 32* 27* 24* 20  CREATININE 1.22 1.15 1.10 0.92 0.91 0.78  CALCIUM 8.0* 7.8* 7.9* 7.7* 7.7* 7.8*  MG 2.2 2.3 2.3 2.4  2.4  --    GFR: Estimated Creatinine Clearance: 91.1 mL/min (by C-G formula based on SCr of 0.78 mg/dL).  Liver Function Tests: Recent Labs  Lab 10/04/17 0254 10/07/17 0305  AST 329* 209*  ALT 403* 393*  ALKPHOS 379* 301*  BILITOT 1.1 0.7  PROT 5.9* 5.2*  ALBUMIN 2.7* 2.3*    Coagulation Profile: Recent Labs  Lab 10/01/17 0302 10/07/17 0305  INR 1.44 1.36    Cardiac Enzymes: Recent Labs  Lab 09/30/17 2125 10/01/17 0302 10/01/17 0807 10/02/17 1848 10/05/17 0426  TROPONINI 12.87* 12.00* 9.10* 5.04* 1.96*    HbA1C: Hgb A1c MFr Bld  Date/Time Value Ref Range Status  09/30/2017 09:25 PM 7.1 (H) 4.8 - 5.6 % Final    Comment:    (NOTE) Pre diabetes:          5.7%-6.4% Diabetes:              >6.4% Glycemic control for   <7.0% adults with diabetes   04/24/2017 08:20 PM 7.5 (H) 4.8 - 5.6 % Final    Comment:    (NOTE) Pre diabetes:          5.7%-6.4% Diabetes:              >6.4% Glycemic control for   <7.0% adults with diabetes     CBG: Recent Labs  Lab 10/06/17 1630 10/06/17 2131 10/06/17 2326 10/07/17 0609 10/07/17 1117  GLUCAP 73 72 105* 87 87    Recent Results (from the past 240 hour(s))  Culture, blood (routine x 2)     Status: None   Collection Time: 09/30/17  7:27 AM  Result Value Ref Range Status   Specimen Description BLOOD LEFT ANTECUBITAL  Final   Special Requests   Final    BOTTLES DRAWN AEROBIC AND ANAEROBIC Blood Culture adequate volume   Culture   Final    NO GROWTH 5 DAYS Performed at [facility], [place] [address] [place], [place] [address]    Report Status 10/05/2017 FINAL  Final  Culture, blood (routine x 2)     Status: None   Collection Time: 09/30/17  7:27 AM  Result Value Ref Range Status   Specimen Description BLOOD RIGHT WRIST  Final   Special Requests   Final    BOTTLES DRAWN AEROBIC AND ANAEROBIC Blood Culture adequate volume   Culture   Final    NO GROWTH 5 DAYS Performed at [facility], [place]  [address], [place],  [address]    Report Status 10/05/2017 FINAL  Final  Respiratory Panel by PCR     Status: None   Collection Time: 09/30/17  8:54 AM  Result Value Ref Range Status   Adenovirus NOT DETECTED NOT DETECTED Final   Coronavirus 229E NOT DETECTED NOT DETECTED Final   Coronavirus HKU1 NOT DETECTED NOT DETECTED Final   Coronavirus NL63  NOT DETECTED NOT DETECTED Final   Coronavirus OC43 NOT DETECTED NOT DETECTED Final   Metapneumovirus NOT DETECTED NOT DETECTED Final   Rhinovirus / Enterovirus NOT DETECTED NOT DETECTED Final   Influenza A NOT DETECTED NOT DETECTED Final   Influenza B NOT DETECTED NOT DETECTED Final   Parainfluenza Virus 1 NOT DETECTED NOT DETECTED Final   Parainfluenza Virus 2 NOT DETECTED NOT DETECTED Final   Parainfluenza Virus 3 NOT DETECTED NOT DETECTED Final   Parainfluenza Virus 4 NOT DETECTED NOT DETECTED Final   Respiratory Syncytial Virus NOT DETECTED NOT DETECTED Final   Bordetella pertussis NOT DETECTED NOT DETECTED Final   Chlamydophila pneumoniae NOT DETECTED NOT DETECTED Final   Mycoplasma pneumoniae NOT DETECTED NOT DETECTED Final    Comment: Performed at [facility], [place] [address], [place], [place] [address]  Urine culture     Status: None   Collection Time: 09/30/17  1:00 PM  Result Value Ref Range Status   Specimen Description URINE, CLEAN CATCH  Final   Special Requests NONE  Final   Culture   Final    NO GROWTH Performed at [facility], [place] [address] [place], [place] [address]    Report Status 10/01/2017 FINAL  Final  MRSA PCR Screening     Status: None   Collection Time: 09/30/17  9:11 PM  Result Value Ref Range Status   MRSA by PCR NEGATIVE NEGATIVE Final    Comment:        The GeneXpert MRSA Assay (FDA approved for NASAL specimens only), is one component of a comprehensive MRSA colonization surveillance program. It is not intended to diagnose MRSA infection nor to guide or monitor treatment for MRSA  infections. Performed at [facility], [place] [address], [place], [place] [address]      Scheduled Meds: . ARIPiprazole  5 mg Oral Daily  . aspirin  325 mg Oral Daily  . carvedilol  3.125 mg Oral BID WC  . donepezil  10 mg Oral Daily  . FLUoxetine  40 mg Oral Daily  . furosemide  40 mg Oral Daily  . heparin injection (subcutaneous)  5,000 Units Subcutaneous Q8H  . insulin aspart  0-15 Units Subcutaneous TID WC  . insulin aspart  0-5 Units Subcutaneous QHS  . lamoTRIgine  100 mg Oral BID  . losartan  25 mg Oral Daily  . mouth rinse  15 mL Mouth Rinse BID  . potassium citrate  10 mEq Oral BID  . sodium chloride flush  3 mL Intravenous Q12H  . spironolactone  25 mg Oral Daily  . traZODone  100 mg Oral QHS     LOS: 7 days   Cherene Altes, MD [facility] Office  [phone] Pager - Text Page per Amion as per below:  On-Call/Text Page:      [email or web address]      password [email or web address]  If 7PM-7AM, please contact night-coverage [email or web address] Password [email or web address] 10/07/2017, 1:52 PM

## 2017-10-07 NOTE — Progress Notes (Signed)
ANTICOAGULATION CONSULT NOTE - Follow Up Consult  Pharmacy Consult for heparin Indication: NSTEMI  Labs: Recent Labs    10/05/17 0426 10/06/17 0335 10/07/17 0305  HGB 9.7* 9.6* 9.9*  HCT 31.7* 31.5* 32.6*  PLT 124* 120* 129*  LABPROT  --   --  16.6*  INR  --   --  1.36  HEPARINUNFRC 0.53 0.41 0.27*  CREATININE 0.92 0.91 0.78  TROPONINI 1.96*  --   --     Assessment: 71yo male subtherapeutic on heparin after several levels at goal though had been slowly trending down.  Goal of Therapy:  Heparin level 0.3-0.7 units/ml   Plan:  Will increase heparin gtt by 1 unit/kg/hr to 1200 units/hr and check level in 6 hours.    Wynona Neat, PharmD, BCPS  10/07/2017,4:08 AM

## 2017-10-07 NOTE — Progress Notes (Signed)
Progress Note  Patient Name: [name] Date of Encounter: 10/07/2017  Primary Cardiologist: Peter Martinique, MD   Subjective   General malaise; no chest pain or dyspnea  Inpatient Medications    Scheduled Meds: . ARIPiprazole  5 mg Oral Daily  . aspirin  325 mg Oral Daily  . carvedilol  3.125 mg Oral BID WC  . donepezil  10 mg Oral Daily  . FLUoxetine  40 mg Oral Daily  . furosemide  20 mg Oral Daily  . insulin aspart  0-15 Units Subcutaneous TID WC  . insulin aspart  0-5 Units Subcutaneous QHS  . lamoTRIgine  100 mg Oral BID  . losartan  25 mg Oral Daily  . mouth rinse  15 mL Mouth Rinse BID  . potassium citrate  10 mEq Oral BID  . sodium chloride flush  3 mL Intravenous Q12H  . traZODone  100 mg Oral QHS   Continuous Infusions: . sodium chloride    . heparin 1,200 Units/hr (10/07/17 0438)   PRN Meds: sodium chloride, acetaminophen, fluticasone, nitroGLYCERIN, ondansetron (ZOFRAN) IV, sodium chloride flush   Vital Signs    Vitals:   10/06/17 2011 10/06/17 2330 10/07/17 0314 10/07/17 0751  BP: 119/65 125/70 116/71 122/67  Pulse: 68 68 69 72  Resp: (!) 22 18 20 20   Temp: 97.7 F (36.5 C) 98.2 F (36.8 C) 98 F (36.7 C)   TempSrc: Oral Oral Oral   SpO2: 93% 96% 94% 97%  Weight:   212 lb 15.4 oz (96.6 kg)   Height:        Intake/Output Summary (Last 24 hours) at 10/07/2017 0914 Last data filed at 10/07/2017 0800 Gross per 24 hour  Intake 0 ml  Output 940 ml  Net -940 ml   Filed Weights   10/05/17 0447 10/06/17 0331 10/07/17 0314  Weight: 216 lb 14.9 oz (98.4 kg) 222 lb 0.1 oz (100.7 kg) 212 lb 15.4 oz (96.6 kg)    Telemetry    NSR with AIVR - Personally Reviewed  Physical Exam   GEN: Obese NAD Neck: supple Cardiac: RRR Respiratory: Clear to auscultation bilaterally; no wheeze GI: Soft, NT/ND MS: 1+ edema Neuro: no focal findings   Labs    Chemistry Recent Labs  Lab 10/04/17 0254 10/05/17 0426 10/06/17 0335 10/07/17 0305  NA 133*  135 136 140  K 4.3 4.4 4.2 4.0  CL 102 102 102 104  CO2 18* 23 23 26   GLUCOSE 126* 86 93 95  BUN 32* 27* 24* 20  CREATININE 1.10 0.92 0.91 0.78  CALCIUM 7.9* 7.7* 7.7* 7.8*  PROT 5.9*  --   --  5.2*  ALBUMIN 2.7*  --   --  2.3*  AST 329*  --   --  209*  ALT 403*  --   --  393*  ALKPHOS 379*  --   --  301*  BILITOT 1.1  --   --  0.7  GFRNONAA >60 >60 >60 >60  GFRAA >60 >60 >60 >60  ANIONGAP 13 10 11 10      Hematology Recent Labs  Lab 10/05/17 0426 10/06/17 0335 10/07/17 0305  WBC 16.5* 13.1* 12.1*  RBC 3.40* 3.34* 3.50*  HGB 9.7* 9.6* 9.9*  HCT 31.7* 31.5* 32.6*  MCV 93.2 94.3 93.1  MCH 28.5 28.7 28.3  MCHC 30.6 30.5 30.4  RDW 16.4* 16.6* 16.5*  PLT 124* 120* 129*    Cardiac Enzymes Recent Labs  Lab 10/01/17 0302 10/01/17 0807 10/02/17 1848 10/05/17  0426  TROPONINI 12.00* 9.10* 5.04* 1.96*     Radiology    Dg Esophagus  Result Date: 10/05/2017 CLINICAL DATA:  Esophageal dysmotility EXAM: ESOPHOGRAM/BARIUM SWALLOW TECHNIQUE: Single contrast examination was performed using  thin barium. FLUOROSCOPY TIME:  Fluoroscopy Time:  3 min 6 sec Radiation Exposure Index (if provided by the fluoroscopic device): Number of Acquired Spot Images: 0 COMPARISON:  None. FINDINGS: Fluoroscopic evaluation of swallowing demonstrates severe dysmotility with loss of all primary esophageal peristaltic waves and extensive tertiary contractions throughout the esophagus. Initial imaging in the upper esophageal region demonstrates lobular soft tissue filling defect along the right lateral wall of the upper thoracic esophagus near the thoracic inlet. On subsequent imaging, this moves distally compatible with retained large food bolus. This food bolus and the 13 mm barium tablet sticks in the mid to distal esophagus. The distal esophagus never distends well. Cannot exclude distal esophageal stricture. IMPRESSION: Large retained fluid bolus and possible food impaction noted. This food bolus moved  from the upper thoracic esophagus to the lower esophagus during the study. There is poor distention and expansion of the distal esophagus. Cannot exclude distal esophageal stricture. Severe dysmotility. Electronically Signed   By: Rolm Baptise M.D.   On: 10/05/2017 13:24    Patient Profile     72 y.o. 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  Patient ruled in for a non-ST elevation myocardial infarction and echocardiogram shows newly reduced LV function with ejection fraction 25%.  Assessment & Plan    1 NSTEMI: Patient denies chest pain.  Continue aspirin, statin and beta-blocker. Change heparin to SQ.  He remains weak and needs significant rehabilitation.  We can consider catheterization if he improves physically.  If he does not improve best option may be medical therapy.  2 Acute systolic heart failure: Patient remains volume overloaded on examination.  Will increase Lasix to 40 mg daily; add spironolactone 25 mg daily.  Follow renal function closely.  Continue coreg and cozaar.  3 Sepsis-per IM.  4 H/O PE-will change heparin to apixaban when it is clear all procedures complete.  For questions or updates, please contact [place] Please consult [email or web address] for contact info under Cardiology/STEMI.      Signed, Kirk Ruths, MD  10/07/2017, 9:14 AM

## 2017-10-07 NOTE — Progress Notes (Signed)
-   Repeat barium swallow today showed no evidence of food bolus or food impaction. It showed significant tertiary contraction. No evidence of stricture or mass. - Okay to advance diet from GI standpoint. Recommend speech pathology evaluation - okay  start anticoagulation from GI standpoint as we are not planning for any endoscopic procedure at this time.  Otis Brace MD, [place] 10/07/2017, 3:07 PM  Contact #  [phone]

## 2017-10-07 NOTE — Progress Notes (Signed)
Pt refuse NIV for the night. Pt states that he just wants to wear his oxygen.

## 2017-10-07 NOTE — Care Management Important Message (Signed)
Important Message  Patient Details  Name: AZAD CALAME MRN: [id number] Date of Birth: 1946/03/09   Medicare Important Message Given:  Yes    Kethan Papadopoulos 10/07/2017, 9:30 AM

## 2017-10-07 NOTE — Consult Note (Signed)
Referring Provider:  [facility] Primary Care Physician:  Bernell List, MD Primary Gastroenterologist:  Dr. [name]   Reason for Consultation:  Abn Barium swallow   HPI: [name] is a 72 y.o. male with multiple medical issues  admitted to the hospital on 09/30/2017 with weakness. Subsequently he was diagnosed with NSTEMI and acute heart failure. Barium swallow was performed on 10/05/2017 to evaluate dysphagia which showed possible distal esophageal stricture as well as possible food bolus. GI is consulted for further evaluation.  Patient seen and examined at bedside. Daughter at bedside. Patient is feeling short of breath with minimal conversation.  According to him he is having trouble swallowing mostly to solid food for  many years. He think he may have passed food bolus into the stomach.  complaining of active shortness of breath. Denied any active chest pain. Denied abdominal pain, diarrhea or constipation. Denied blood in the stool or black stool.  EGD was 3 years ago according to patient.  Past Medical History:  Diagnosis Date  . AKI (acute kidney injury) ([place])    a. 6/14: resolved after d/c ARB;  b. RA U/S 7/14: no RA stenosis  . Arthritis    of spine  . Atrial flutter ([place])    a. 2008 - one episode in setting of sepsis/shock.  . Back pain    persistent  . Coronary artery disease    a. s/p CABG 2005. b. low risk nuc 2016.  Marland Kitchen Depression   . Diabetes mellitus    controled by diet  . Diverticulitis    a. s/p partial colectomy remotely.  Marland Kitchen GERD (gastroesophageal reflux disease)   . H/O alcohol abuse   . Headache(784.0)   . Hearing loss   . History of hiatal hernia   . Hx of echocardiogram    Echo 7/14:  Mod LVH, EF 55-60%, Gr 1 DD, mild MR, PASP 36  . Hyperlipidemia   . Hypertension   . Pulmonary embolism ([place]) 2005  . Renal stone 04/15/2014  . Seizures ([place])   . Sleep apnea    does not use CPAP  . Stroke [facility])     Past Surgical History:  Procedure Laterality Date  .  APPENDECTOMY    . BACK SURGERY    . CARDIAC CATHETERIZATION    . CERVICAL FUSION    . CORONARY ARTERY BYPASS GRAFT  2005   LIMA graft to LAD,saphenous vein graft to diag.,circumflex, marginal,and to the RCA  . CYSTOSCOPY/RETROGRADE/URETEROSCOPY/STONE EXTRACTION WITH BASKET Left 04/16/2014   Procedure: CYSTOSCOPY/RETROGRADE/LEFT URETEROSCOPY/STONE EXTRACTION WITH BASKET AND LASER LITHOTRIPSY;  Surgeon: Malka So, MD;  Location: [facility];  Service: Urology;  Laterality: Left;  With STENT  . ESOPHAGOGASTRODUODENOSCOPY  02/10/2012   Procedure: ESOPHAGOGASTRODUODENOSCOPY (EGD);  Surgeon: Cleotis Nipper, MD;  Location: [facility] ENDOSCOPY;  Service: Endoscopy;  Laterality: N/A;  . ESOPHAGOGASTRODUODENOSCOPY  06/29/2012   Procedure: ESOPHAGOGASTRODUODENOSCOPY (EGD);  Surgeon: Winfield Cunas., MD;  Location: [facility] ENDOSCOPY;  Service: Endoscopy;  Laterality: N/A;  . FOREIGN BODY REMOVAL  02/10/2012   Procedure: FOREIGN BODY REMOVAL;  Surgeon: Cleotis Nipper, MD;  Location: [place];  Service: Endoscopy;  Laterality: N/A;  . FOREIGN BODY REMOVAL  06/29/2012   Procedure: FOREIGN BODY REMOVAL;  Surgeon: Winfield Cunas., MD;  Location: [facility] ENDOSCOPY;  Service: Endoscopy;  Laterality: N/A;  . LEFT HEART CATHETERIZATION WITH CORONARY ANGIOGRAM N/A 04/02/2012   Procedure: LEFT HEART CATHETERIZATION WITH CORONARY ANGIOGRAM;  Surgeon: Sinclair Grooms, MD;  Location: [facility]  CATH LAB;  Service: Cardiovascular;  Laterality: N/A;  . LOOP RECORDER IMPLANT N/A 11/17/2013   Procedure: LOOP RECORDER IMPLANT;  Surgeon: Deboraha Sprang, MD;  Location: [facility] CATH LAB;  Service: Cardiovascular;  Laterality: N/A;  . ORCHIECTOMY    . PARTIAL COLECTOMY     for diverticuli  . SAVORY DILATION  06/29/2012   Procedure: SAVORY DILATION;  Surgeon: Winfield Cunas., MD;  Location: [facility]OSCOPY;  Service: Endoscopy;  Laterality: N/A;  . TEE WITHOUT CARDIOVERSION N/A 11/17/2013   Procedure: TRANSESOPHAGEAL ECHOCARDIOGRAM (TEE);  Surgeon:  Larey Dresser, MD;  Location: [place];  Service: Cardiovascular;  Laterality: N/A;  . [place]      Prior to Admission medications   Medication Sig Start Date End Date Taking? Authorizing Provider  acetaminophen (TYLENOL) 325 MG tablet Take 650 mg by mouth every 6 (six) hours as needed for moderate pain.   Yes [provider]  ARIPiprazole (ABILIFY) 5 MG tablet Take 5 mg by mouth daily.    Yes [provider]  Artificial Tear Solution (GENTEAL TEARS OP) Apply 1 drop to eye as needed (dry eyes).   Yes [provider]  aspirin 325 MG EC tablet Take 325 mg daily by mouth.   Yes [provider]  atorvastatin (LIPITOR) 80 MG tablet Take 1 tablet (80 mg total) by mouth daily. 10/29/13  Yes Copland, Gay Filler, MD  camphor-menthol [facility]) lotion Apply 1 application topically daily. To entire body   Yes [provider]  donepezil (ARICEPT) 10 MG tablet Take 10 mg by mouth daily.    Yes [provider]  Fluocinolone Acetonide 0.01 % OIL Apply 1 application topically daily as needed (itching). Apply to scalp   Yes [provider]  FLUoxetine (PROZAC) 20 MG capsule Take 40 mg by mouth daily.   Yes [provider]  fluticasone (FLONASE) 50 MCG/ACT nasal spray Place 1 spray into both nostrils 2 (two) times daily as needed for rhinitis.    Yes [provider]  furosemide (LASIX) 20 MG tablet TAKE 1-2 TABLETS BY MOUTH EVERY DAY AS NEEDED Patient taking differently: 20 mg mg DAILY in the morning (4am) 10/24/14  Yes Martinique, Peter M, MD  glimepiride (AMARYL) 2 MG tablet Take 1 mg by mouth daily with breakfast. Patient takes one-half tablet (46m)  every day with breakfast   Yes [provider]  lamoTRIgine (LAMICTAL) 200 MG tablet Take 100 mg by mouth 2 (two) times daily.   Yes [provider]  Multiple Vitamin (MULTIVITAMIN) capsule Take 1 capsule by mouth daily.   Yes [provider]   nitroGLYCERIN (NITROSTAT) 0.4 MG SL tablet Place 1 tablet (0.4 mg total) under the tongue every 5 (five) minutes as needed. For chest pain 03/10/15  Yes JMartinique Peter M, MD  OXYGEN Inhale 2.5 L into the lungs.   Yes [provider]  potassium citrate (UROCIT-K) 10 MEQ (1080 MG) SR tablet Take 10 mEq 2 (two) times daily by mouth.  07/08/14  Yes [provider]  traZODone (DESYREL) 100 MG tablet Take 100 mg by mouth at bedtime.    Yes [provider]  Triamcinolone Acetonide (TRIAMCINOLONE 0.1 % CREAM : EUCERIN) CREA Apply 1 application topically daily. TRIAMCINOLONE 1%/ Absorbase   Yes [provider]  ONE TOUCH ULTRA TEST test strip USE TO TEST BLOOD SUGAR DAILY 07/11/15   DDarlyne Russian MD    Scheduled Meds: . ARIPiprazole  5 mg Oral Daily  .  aspirin  325 mg Oral Daily  . carvedilol  3.125 mg Oral BID WC  . donepezil  10 mg Oral Daily  . FLUoxetine  40 mg Oral Daily  . furosemide  40 mg Oral Daily  . heparin injection (subcutaneous)  5,000 Units Subcutaneous Q8H  . insulin aspart  0-15 Units Subcutaneous TID WC  . insulin aspart  0-5 Units Subcutaneous QHS  . lamoTRIgine  100 mg Oral BID  . losartan  25 mg Oral Daily  . mouth rinse  15 mL Mouth Rinse BID  . potassium citrate  10 mEq Oral BID  . sodium chloride flush  3 mL Intravenous Q12H  . spironolactone  25 mg Oral Daily  . traZODone  100 mg Oral QHS   Continuous Infusions: . sodium chloride     PRN Meds:.sodium chloride, acetaminophen, fluticasone, nitroGLYCERIN, ondansetron (ZOFRAN) IV, sodium chloride flush  Allergies as of 09/30/2017 - Review Complete 09/30/2017  Allergen Reaction Noted  . Cephalexin  06/12/2011  . Doxycycline  06/12/2011  . Keflex [cephalexin]  12/07/2013  . Lac bovis Other (See Comments) 04/28/2013  . Pentazocine lactate  06/12/2011  . Talwin [pentazocine] Other (See Comments) 05/19/2014  . Tape Other (See Comments) 02/11/2015  . Zanaflex [tizanidine hcl] Other  (See Comments) 02/11/2015    Family History  Problem Relation Age of Onset  . Emphysema Mother        was a smoker  . Asthma Mother   . Hypertension Mother   . Heart disease Father   . Prostate cancer Paternal Grandfather   . Pancreatic cancer Paternal Uncle   . Stroke Paternal Grandmother   . Heart attack Neg Hx     Social History   Socioeconomic History  . Marital status: Married    Spouse name: Clinical research associate  . Number of children: 2  . Years of education: college  . Highest education level: Not on file  Social Needs  . Financial resource strain: Not on file  . Food insecurity - worry: Not on file  . Food insecurity - inability: Not on file  . Transportation needs - medical: Not on file  . Transportation needs - non-medical: Not on file  Occupational History  . 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 Smoking status: Never Smoker  . Smokeless tobacco: Never Used  Substance and Sexual Activity  . Alcohol use: No    Comment: pt states "I used to but not anymore"  . Drug use: No  . Sexual activity: Not Currently  Other Topics Concern  . Not on file  Social History Narrative   Patient lives at home with  wife      Patient drinks 2 cups of coffee a day.patient is left handed    Review of Systems: Review of Systems  Constitutional: Positive for malaise/fatigue. Negative for fever.  HENT: Negative for hearing loss and tinnitus.   Eyes: Negative for blurred vision and double vision.  Respiratory: Positive for cough and shortness of breath. Negative for hemoptysis.   Cardiovascular: Positive for chest pain, claudication and leg swelling.  Gastrointestinal: Positive for heartburn. Negative for abdominal pain, nausea and vomiting.  Genitourinary: Negative for dysuria and urgency.  Musculoskeletal: Negative for myalgias and neck pain.  Neurological: Positive for weakness. Negative for seizures and loss of consciousness.   Endo/Heme/Allergies: Does not bruise/bleed easily.  Psychiatric/Behavioral: Negative for hallucinations and suicidal ideas.  ll negative except as stated above in  HPI.  Physical Exam: Vital signs: Vitals:   10/07/17 0314 10/07/17 0751  BP: 116/71 122/67  Pulse: 69 72  Resp: 20 20  Temp: 98 F (36.7 C)   SpO2: 94% 97%   Last BM Date: 10/05/17 Physical Exam  Constitutional: He is oriented to person, place, and time. He appears distressed.  HENT:  Mouth/Throat: No oropharyngeal exudate.  Eyes: EOM are normal. No scleral icterus.  Neck: Normal range of motion. Neck supple. No thyromegaly present.  Cardiovascular:  Murmur heard. Distant breath sounds. Irregular rate  Pulmonary/Chest: He is in respiratory distress.  Bibasilar fine crackles  Abdominal: Soft. Bowel sounds are normal. He exhibits no distension. There is no tenderness. There is no rebound and no guarding.  Musculoskeletal: Normal range of motion. He exhibits edema.  Neurological: He is alert and oriented to person, place, and time.  Skin: Skin is warm. No erythema.  Psychiatric: He has a normal mood and affect. Thought content normal.  Vitals reviewed.  GI:  Lab Results: Recent Labs    10/05/17 0426 10/06/17 0335 10/07/17 0305  WBC 16.5* 13.1* 12.1*  HGB 9.7* 9.6* 9.9*  HCT 31.7* 31.5* 32.6*  PLT 124* 120* 129*   BMET Recent Labs    10/05/17 0426 10/06/17 0335 10/07/17 0305  NA 135 136 140  K 4.4 4.2 4.0  CL 102 102 104  CO2 23 23 26   GLUCOSE 86 93 95  BUN 27* 24* 20  CREATININE 0.92 0.91 0.78  CALCIUM 7.7* 7.7* 7.8*   LFT Recent Labs    10/07/17 0305  PROT 5.2*  ALBUMIN 2.3*  AST 209*  ALT 393*  ALKPHOS 301*  BILITOT 0.7   PT/INR Recent Labs    10/07/17 0305  LABPROT 16.6*  INR 1.36     Studies/Results: No results found.  Impression/Plan: - Abnormal barium swallow concerning for retained food bolus with possible distal esophageal stricture. - Acute heart failure -  Ongoing shortness of breath - NSTEMI  - Elevated LFTs. ?? Ischemic hepatitis  Recommendations ------------------------- - Long discussion with patient's daughter and patient's wife. Patient is at high-risk for any endoscopic intervention given recent NSTEMI and active shortness of breath. Case discussed with cardiology Dr. Stanford Breed. We are in agreement that we should do noninvasive test first prior to doing endoscopic intervention. - patient is thinking that he may have passed food bolus into the stomach. Nothing is reasonable to repeat barium swallow today to confirm food bolus prior to proceeding with EGD. ID/W with Radiology  Dr. Posey Pronto about this. - Further plan based on repeat barium swallow. - Hepatitis panel. CT abdomen on admission showed normal-appearing pancreas. Normal common bile duct.    LOS: 7 days   Otis Brace  MD, FACP 10/07/2017, 12:30 PM  Contact #  [phone]

## 2017-10-08 ENCOUNTER — Inpatient Hospital Stay (HOSPITAL_COMMUNITY): Payer: Medicare Other

## 2017-10-08 DIAGNOSIS — R1312 Dysphagia, oropharyngeal phase: Secondary | ICD-10-CM

## 2017-10-08 DIAGNOSIS — K56609 Unspecified intestinal obstruction, unspecified as to partial versus complete obstruction: Secondary | ICD-10-CM

## 2017-10-08 LAB — BASIC METABOLIC PANEL
Anion gap: 10 (ref 5–15)
Anion gap: 13 (ref 5–15)
BUN: 15 mg/dL (ref 6–20)
BUN: 11 mg/dL (ref 6–20)
CALCIUM: 7.6 mg/dL — AB (ref 8.9–10.3)
CO2: 26 mmol/L (ref 22–32)
CO2: 22 mmol/L (ref 22–32)
CREATININE: 0.72 mg/dL (ref 0.61–1.24)
CREATININE: 0.62 mg/dL (ref 0.61–1.24)
Calcium: 7.8 mg/dL — ABNORMAL LOW (ref 8.9–10.3)
Chloride: 101 mmol/L (ref 101–111)
Chloride: 96 mmol/L — ABNORMAL LOW (ref 101–111)
GFR calc Af Amer: >60 mL/min (ref >60)
GLUCOSE: 101 mg/dL — AB (ref 65–99)
Glucose, Bld: 90 mg/dL (ref 65–99)
Potassium: 3.8 mmol/L (ref 3.5–5.1)
Potassium: 3.7 mmol/L (ref 3.5–5.1)
SODIUM: 137 mmol/L (ref 135–145)
Sodium: 131 mmol/L — ABNORMAL LOW (ref 135–145)

## 2017-10-08 LAB — HEPATIC FUNCTION PANEL
ALT: 290 U/L — ABNORMAL HIGH (ref 17–63)
AST: 116 U/L — ABNORMAL HIGH (ref 15–41)
Albumin: 2.2 g/dL — ABNORMAL LOW (ref 3.5–5.0)
Alkaline Phosphatase: 251 U/L — ABNORMAL HIGH (ref 38–126)
BILIRUBIN INDIRECT: 0.6 mg/dL (ref 0.3–0.9)
Bilirubin, Direct: 0.2 mg/dL (ref 0.1–0.5)
TOTAL PROTEIN: 5.4 g/dL — AB (ref 6.5–8.1)
Total Bilirubin: 0.8 mg/dL (ref 0.3–1.2)

## 2017-10-08 LAB — CBC WITH DIFFERENTIAL/PLATELET
BASOS ABS: 0 10*3/uL (ref 0.0–0.1)
Basophils Relative: 0 %
EOS PCT: 2 %
Eosinophils Absolute: 0.2 10*3/uL (ref 0.0–0.7)
HEMATOCRIT: 31.4 % — AB (ref 39.0–52.0)
Hemoglobin: 9.8 g/dL — ABNORMAL LOW (ref 13.0–17.0)
LYMPHS ABS: 1.6 10*3/uL (ref 0.7–4.0)
LYMPHS PCT: 15 %
MCH: 28.7 pg (ref 26.0–34.0)
MCHC: 31.2 g/dL (ref 30.0–36.0)
MCV: 91.8 fL (ref 78.0–100.0)
Monocytes Absolute: 1.4 10*3/uL — ABNORMAL HIGH (ref 0.1–1.0)
Monocytes Relative: 13 %
NEUTROS ABS: 7.6 10*3/uL (ref 1.7–7.7)
Neutrophils Relative %: 70 %
PLATELETS: 177 10*3/uL (ref 150–400)
RBC: 3.42 MIL/uL — ABNORMAL LOW (ref 4.22–5.81)
RDW: 16.7 % — AB (ref 11.5–15.5)
WBC: 10.8 10*3/uL — ABNORMAL HIGH (ref 4.0–10.5)

## 2017-10-08 LAB — HEPATITIS PANEL, ACUTE
HEP B S AG: NEGATIVE
Hep A IgM: NEGATIVE
Hep B C IgM: NEGATIVE

## 2017-10-08 LAB — GLUCOSE, CAPILLARY
GLUCOSE-CAPILLARY: 90 mg/dL (ref 65–99)
GLUCOSE-CAPILLARY: 158 mg/dL — AB (ref 65–99)
GLUCOSE-CAPILLARY: 103 mg/dL — AB (ref 65–99)
Glucose-Capillary: 95 mg/dL (ref 65–99)

## 2017-10-08 MED ORDER — FUROSEMIDE 40 MG PO TABS
40.0000 mg | ORAL_TABLET | Freq: Two times a day (BID) | ORAL | Status: DC
  Administered 2017-10-08: 40 mg via ORAL
  Filled 2017-10-08: qty 1

## 2017-10-08 MED ORDER — ATORVASTATIN CALCIUM 40 MG PO TABS
40.0000 mg | ORAL_TABLET | Freq: Every day | ORAL | Status: DC
  Administered 2017-10-09 – 2017-10-10 (×2): 40 mg via ORAL
  Filled 2017-10-08 (×2): qty 1

## 2017-10-08 MED ORDER — FUROSEMIDE 10 MG/ML IJ SOLN
40.0000 mg | Freq: Once | INTRAMUSCULAR | Status: AC
  Administered 2017-10-08: 40 mg via INTRAVENOUS
  Filled 2017-10-08: qty 4

## 2017-10-08 NOTE — Progress Notes (Signed)
PROGRESS NOTE    [name]  POE:[id number] DOB: Oct 08, 1945 DOA: 09/30/2017 PCP: Bernell List, MD   Brief Narrative:  72 yo WM PMHx  Seizures, depression,CVA; OSA not on CPAP; seizures; HTN; HLD; Dementia;  ETOH abuse; DM(diet-controlled); CAD s/p CABG 3154; chronic diastolic CHF 0086, atrial flutter, AKI, Diverticulitis  s/p partial colectomy, hiatal hernia, PE 2005, nephrolithiasis 04/15/2014  Presenting with weakness.  Initially, the patient provided the history and said "I don't know" what brought him in.  He started feeling bad 2-3 years ago.  Started feeling like he does today 2-3 weeks ago.  Can't eat.  No SOB.  Occasional cough, not new, productive of clear sputum.  No fever at home.  +headache x 2-3 years, confusion x 2-3 months.  Hurts left lower back.  No chest pain, palpitations.  No abdominal pain.  No N/V/D/C.   No rash.  +weakness.  Occasional dysphagia.   Based on minimal meaningful history provided by the patient, I called and spoke to his wife by telephone.  He was previously admitted here from 1/1-7 with C. Diff colitis and discharged on oral vancomycin; he has troponin elevation thought to be related to demand ischemia and also had a BIPAP requirement qhs, possibly due to diastolic heart failure.  He left and was sent to [facility] and then he was transferred to the [facility] in [place] for rehab.  His wife brought him home a week ago and he was negative for C diff prior to discharge.  2 days ago he started walking slower and leaning to his left.  Last night, he was unable to stand for his shower.  He got out of the bed overnight and fell onto his buttocks.  His wife was able to get him up and back in bed but then it happened again.  Her grandson was able to pick him up and they placed him in a lift recliner.  He was "confused with his feet."  Prior CVA with left-sided weakness but now with apparent left-sided weakness.  He has been turning his feet inward but he did not understand what  she was saying.  He did not have a fever at home until just as she called EMS.  He is intermittently confused at home.  Yesterday, he was breathing in a pattern that was bothersome to the family - "he sucks air in through his mouth and swallows it and has to burp or pass gas and he did that all day long yesterday."  No cough until today.  ?productive sputum.  Urine with an odor today.  +diarrhea but this is chronic after colonic resection; this has been better than when he had C diff.       ED Course: Sepsis - started on Vanc/Levaquin/Aztreonam.  Uncertain source - flu negative.  CXR and CTA negative.  Urine unremarkable.  Cardiology consulted - no cath while floridly septic, start Heparin, they will follow.    Subjective: 2/13 A/O 3 (does not know why). Negative SOB, negative abdominal pain, negative CP. Negative N/V.     Assessment & Plan:   Principal Problem:   Sepsis ([place]) Active Problems:   Diabetes mellitus ([place])   Obstructive sleep apnea   Essential hypertension   NSTEMI (non-ST elevated myocardial infarction) ([place])   Dementia   Sepsis unspecified organism  -Complete seven-day course antibiotics, except will DC vancomycin.as MRSA negative by PCR and blood cultures negative - 2/9 repeat PCXR: Inconclusive for aspiration pneumonia, however current antibiotics should cover  most organisms. In addition patient afebrile and clinically improving from respiratory status -Recent C. difficile infection (treated): Currently asymptomatic for C. difficile infection. DC precautions -Panculture negative results below -Patient extremely weak. Completed course antibiotics. Panculture negative. Acute hepatitis panel negative. All antibiotics discontinued.    SBO vs/Ileus/ Esophageal stricture/Esophageal dysmotility - NPO; until workup complete -KUB negative for SBO/ileus -Barium swallow: Positive for impacted food/esophageal stricture see results below -[facility] was contacted and repeated  barium swallow. Repeat barium swallow showed no impacted esophageal food/esophageal mass/esophageal strictures see results below. -Patient started on clear liquid diet per GI.  NSTEMI -Currently no chest pain however patient extremely fatigued  -Initial EKG with ST depressions concerning for global ischemia -TIMI risk score= 5 -Troponin significantly elevated but trending down continue to monitor Recent Labs  Lab 10/02/17 1848 10/05/17 0426  TROPONINI 5.04* 1.96*  -ASA 325 mg daily -Lipitor 80 mg daily -Heparin drip -Metoprolol 12.5 mg BID -2/6 Echocardiogram; worsening EF when compared to echocardiogram from 08/29/2017 (EF 50-55%) now EF only 20-25%. See results below  -Per cardiology cardiac catheterization placed on hold secondary to patient's debilitated state. Possible cardiac catheterization in the future after patient improves at SNF  Acute on chronic systolic CHF -Strict in and out since admission +5.6 L -Daily weight Filed Weights   10/06/17 0331 10/07/17 0314 10/08/17 0300  Weight: 222 lb 0.1 oz (100.7 kg) 212 lb 15.4 oz (96.6 kg) 217 lb 13 oz (98.8 kg)  -see NSTEMI  Pulmonary hypertension -See NSTEMI  Essential HTN  -unsure of patient's compliance at home with medication -See NSTEMI    Diabetes type 2 uncontrolled with complication -0/[id number] A1c= 7.1 -Moderate SSI  HLD  -Lipid panel not within ADA/AHA guidelines -Lipitor 40 mg daily  Dementia  -Must be early onset able answer all questions appropriately -Continue Aricept, Abilify, Lamictal, and Trazodone   OSA -Not on home CPAP -CPAP per respiratory     DVT prophylaxis: Heparin drip Code Status: Full Family Communication: None Disposition Plan: TBD   Consultants:  Cardiology GI     Procedures/Significant Events:  2/6 Echocardiogram:- Left ventricle: - mild LVH. -LVEF = 20% to 25%. - Mitral valve: mild to moderate regurgitation. - Tricuspid valve:  moderate regurgitation. - Pulmonary  arteries:  PA peak pressure: 42 mm Hg (S). 2/9 PCXR: Negative aspiration, pneumonia 2/9 KUB: Negative SBO/ileus 2/9 DG Esophagus:Large retained fluid bolus and possible food impaction noted. This food bolus moved from the upper thoracic esophagus to the lower esophagus during the study. There is poor distention and expansion of the distal esophagus. -Cannot exclude distal esophageal stricture. -Severe dysmotility. 2/12 repeat Barium Swallow;  -no evidence of food bolus or food impaction. -Showed significant tertiary contraction. -No evidence of stricture or mass.      I have personally reviewed and interpreted all radiology studies and my findings are as above.  VENTILATOR SETTINGS:    Cultures 2/5 blood negative 2/5 urine negative 2/5 MRSA by PCR negative 2/5 respiratory virus panel negative 2/12 acute hepatitis panel negative     Antimicrobials: Anti-infectives (From admission, onward)   Start     Stop   10/06/17 1600  aztreonam (AZACTAM) 1 g in sodium chloride 0.9 % 100 mL IVPB  Status:  Discontinued     10/06/17 1437   10/01/17 0800  levofloxacin (LEVAQUIN) IVPB 750 mg  Status:  Discontinued     10/06/17 1437   09/30/17 2200  vancomycin (VANCOCIN) IVPB 750 mg/150 ml premix  Status:  Discontinued  10/05/17 1900   09/30/17 1600  aztreonam (AZACTAM) 1 g in dextrose 5 % 50 mL IVPB  Status:  Discontinued     10/06/17 0841   09/30/17 0715  levofloxacin (LEVAQUIN) IVPB 750 mg     09/30/17 1059   09/30/17 0715  aztreonam (AZACTAM) 2 g in dextrose 5 % 50 mL IVPB     09/30/17 0900   09/30/17 0715  vancomycin (VANCOCIN) IVPB 1000 mg/200 mL premix  Status:  Discontinued     09/30/17 0706   09/30/17 0715  vancomycin (VANCOCIN) 2,000 mg in sodium chloride 0.9 % 500 mL IVPB     09/30/17 1530       Devices    LINES / TUBES:      Continuous Infusions: . sodium chloride       Objective: Vitals:   10/07/17 2045 10/07/17 2333 10/08/17 0300 10/08/17 0449  BP:  109/60 104/60  (!) 111/58  Pulse: 69 66 66 66  Resp: 18 (!) 22 15 15   Temp:  98.3 F (36.8 C)  (!) 96.5 F (35.8 C)  TempSrc:  Oral  Axillary  SpO2: 90% 95% 92% 93%  Weight:   217 lb 13 oz (98.8 kg)   Height:        Intake/Output Summary (Last 24 hours) at 10/08/2017 0831 Last data filed at 10/07/2017 2333 Gross per 24 hour  Intake 480 ml  Output -  Net 480 ml   Filed Weights   10/06/17 0331 10/07/17 0314 10/08/17 0300  Weight: 222 lb 0.1 oz (100.7 kg) 212 lb 15.4 oz (96.6 kg) 217 lb 13 oz (98.8 kg)    Physical Exam:  General: A/O 3 (does not know why), No acute respiratory distress Neck:  Negative scars, masses, torticollis, lymphadenopathy, JVD Lungs: Clear to auscultation bilaterally without wheezes or crackles Cardiovascular: Regular rate and rhythm without murmur gallop or rub normal S1 and S2 Abdomen: negative abdominal pain, nondistended, positive soft, bowel sounds, no rebound, no ascites, no appreciable mass Extremities: No significant cyanosis, clubbing, or edema bilateral lower extremities Skin: Negative rashes, lesions, ulcers Psychiatric:  Negative depression, negative anxiety, negative fatigue, negative mania  Central nervous system:  Cranial nerves II through XII intact, tongue/uvula midline, all extremities muscle strength 5/5, sensation intact throughout, negative dysarthria, negative expressive aphasia, negative receptive aphasia.    Data Reviewed: Care during the described time interval was provided by me .  I have reviewed this patient's available data, including medical history, events of note, physical examination, and all test results as part of my evaluation.   CBC: Recent Labs  Lab 10/03/17 0339 10/04/17 0254 10/05/17 0426 10/06/17 0335 10/07/17 0305  WBC 15.1* 13.9* 16.5* 13.1* 12.1*  HGB 9.8* 10.5* 9.7* 9.6* 9.9*  HCT 31.6* 33.9* 31.7* 31.5* 32.6*  MCV 92.1 92.4 93.2 94.3 93.1  PLT 134* 132* 124* 120* 827*   Basic Metabolic  Panel: Recent Labs  Lab 10/02/17 0305 10/03/17 0339 10/04/17 0254 10/05/17 0426 10/06/17 0335 10/07/17 0305 10/08/17 0313  NA 136 134* 133* 135 136 140 137  K 4.2 5.2* 4.3 4.4 4.2 4.0 3.8  CL 103 103 102 102 102 104 101  CO2 22 17* 18* 23 23 26 26   GLUCOSE 132* 122* 126* 86 93 95 90  BUN 39* 37* 32* 27* 24* 20 15  CREATININE 1.22 1.15 1.10 0.92 0.91 0.78 0.72  CALCIUM 8.0* 7.8* 7.9* 7.7* 7.7* 7.8* 7.8*  MG 2.2 2.3 2.3 2.4 2.4  --   --  GFR: Estimated Creatinine Clearance: 92.1 mL/min (by C-G formula based on SCr of 0.72 mg/dL). Liver Function Tests: Recent Labs  Lab 10/04/17 0254 10/07/17 0305 10/08/17 0313  AST 329* 209* 116*  ALT 403* 393* 290*  ALKPHOS 379* 301* 251*  BILITOT 1.1 0.7 0.8  PROT 5.9* 5.2* 5.4*  ALBUMIN 2.7* 2.3* 2.2*   No results for input(s): LIPASE, AMYLASE in the last 168 hours. No results for input(s): AMMONIA in the last 168 hours. Coagulation Profile: Recent Labs  Lab 10/07/17 0305  INR 1.36   Cardiac Enzymes: Recent Labs  Lab 10/02/17 1848 10/05/17 0426  TROPONINI 5.04* 1.96*   BNP (last 3 results) No results for input(s): PROBNP in the last 8760 hours. HbA1C: No results for input(s): HGBA1C in the last 72 hours. CBG: Recent Labs  Lab 10/07/17 0609 10/07/17 1117 10/07/17 1640 10/07/17 2045 10/08/17 0603  GLUCAP 87 87 88 105* 90   Lipid Profile: No results for input(s): CHOL, HDL, LDLCALC, TRIG, CHOLHDL, LDLDIRECT in the last 72 hours. Thyroid Function Tests: No results for input(s): TSH, T4TOTAL, FREET4, T3FREE, THYROIDAB in the last 72 hours. Anemia Panel: No results for input(s): VITAMINB12, FOLATE, FERRITIN, TIBC, IRON, RETICCTPCT in the last 72 hours. Urine analysis:    Component Value Date/Time   COLORURINE YELLOW 09/30/2017 [place] 09/30/2017 1255   LABSPEC >1.046 (H) 09/30/2017 1255   PHURINE 5.0 09/30/2017 1255   GLUCOSEU NEGATIVE 09/30/2017 1255   HGBUR MODERATE (A) 09/30/2017 1255    BILIRUBINUR NEGATIVE 09/30/2017 1255   BILIRUBINUR neg 04/27/2014 1205   KETONESUR NEGATIVE 09/30/2017 1255   PROTEINUR 30 (A) 09/30/2017 1255   UROBILINOGEN 0.2 03/07/2015 1005   NITRITE NEGATIVE 09/30/2017 1255   LEUKOCYTESUR NEGATIVE 09/30/2017 1255   Sepsis Labs: @LABRCNTIP (procalcitonin:4,lacticidven:4)  ) Recent Results (from the past 240 hour(s))  Culture, blood (routine x 2)     Status: None   Collection Time: 09/30/17  7:27 AM  Result Value Ref Range Status   Specimen Description BLOOD LEFT ANTECUBITAL  Final   Special Requests   Final    BOTTLES DRAWN AEROBIC AND ANAEROBIC Blood Culture adequate volume   Culture   Final    NO GROWTH 5 DAYS Performed at [facility], [place] [address], [place], [place] [address]    Report Status 10/05/2017 FINAL  Final  Culture, blood (routine x 2)     Status: None   Collection Time: 09/30/17  7:27 AM  Result Value Ref Range Status   Specimen Description BLOOD RIGHT WRIST  Final   Special Requests   Final    BOTTLES DRAWN AEROBIC AND ANAEROBIC Blood Culture adequate volume   Culture   Final    NO GROWTH 5 DAYS Performed at [facility], [place] [address] [place],  [address]    Report Status 10/05/2017 FINAL  Final  Respiratory Panel by PCR     Status: None   Collection Time: 09/30/17  8:54 AM  Result Value Ref Range Status   Adenovirus NOT DETECTED NOT DETECTED Final   Coronavirus 229E NOT DETECTED NOT DETECTED Final   Coronavirus HKU1 NOT DETECTED NOT DETECTED Final   Coronavirus NL63 NOT DETECTED NOT DETECTED Final   Coronavirus OC43 NOT DETECTED NOT DETECTED Final   Metapneumovirus NOT DETECTED NOT DETECTED Final   Rhinovirus / Enterovirus NOT DETECTED NOT DETECTED Final   Influenza A NOT DETECTED NOT DETECTED Final   Influenza B NOT DETECTED NOT DETECTED Final   Parainfluenza  Virus 1 NOT DETECTED NOT DETECTED Final   Parainfluenza Virus 2 NOT DETECTED NOT DETECTED Final   Parainfluenza Virus 3 NOT DETECTED  NOT DETECTED Final   Parainfluenza Virus 4 NOT DETECTED NOT DETECTED Final   Respiratory Syncytial Virus NOT DETECTED NOT DETECTED Final   Bordetella pertussis NOT DETECTED NOT DETECTED Final   Chlamydophila pneumoniae NOT DETECTED NOT DETECTED Final   Mycoplasma pneumoniae NOT DETECTED NOT DETECTED Final    Comment: Performed at [facility], [place] [address] [place] [address] [place], [place] [address]  Urine culture     Status: None   Collection Time: 09/30/17  1:00 PM  Result Value Ref Range Status   Specimen Description URINE, CLEAN CATCH  Final   Special Requests NONE  Final   Culture   Final    NO GROWTH Performed at [facility], [place] [address], [place], [place] [address]    Report Status 10/01/2017 FINAL  Final  MRSA PCR Screening     Status: None   Collection Time: 09/30/17  9:11 PM  Result Value Ref Range Status   MRSA by PCR NEGATIVE NEGATIVE Final    Comment:        The GeneXpert MRSA Assay (FDA approved for NASAL specimens only), is one component of a comprehensive MRSA colonization surveillance program. It is not intended to diagnose MRSA infection nor to guide or monitor treatment for MRSA infections. Performed at [facility], [place] [address] [place], [place] [address]          Radiology Studies: Dg Esophagus  Result Date: 10/07/2017 CLINICAL DATA:  Dysphagia EXAM: ESOPHOGRAM/BARIUM SWALLOW TECHNIQUE: Single contrast examination was performed using  thin barium. FLUOROSCOPY TIME:  Fluoroscopy Time:  1 minute 6 seconds Radiation Exposure Index (if provided by the fluoroscopic device): 20.7 mGy Number of Acquired Spot Images: 0 COMPARISON:  10/05/2017 FINDINGS: The examination was performed in the semi upright position. There was normal pharyngeal anatomy and motility. Moderate amount of secretions are noted in the cervical esophagus. Contrast flowed freely through the esophagus without evidence of stricture or mass. No evidence of irregularity or ulceration.  Tertiary contractions of the mid and distal thoracic esophagus throughout the examination most concerning for esophageal spasm which delayed transit of contrast. No evidence of reflux. No definite hiatal hernia was demonstrated. IMPRESSION: 1. Tertiary contractions of the mid and distal thoracic esophagus throughout the examination most concerning for esophageal spasm which delayed transit of contrast. 2. No esophageal foreign body or retained food. Moderate amount of secretions are noted in the cervical esophagus. These results were called by telephone at the time of interpretation on 10/07/2017 at 2:59 pm to Dr. Otis Brace , who verbally acknowledged these results. Electronically Signed   By: Kathreen Devoid   On: 10/07/2017 14:59        Scheduled Meds: . ARIPiprazole  5 mg Oral Daily  . aspirin  325 mg Oral Daily  . carvedilol  3.125 mg Oral BID WC  . donepezil  10 mg Oral Daily  . FLUoxetine  40 mg Oral Daily  . furosemide  40 mg Oral Daily  . heparin injection (subcutaneous)  5,000 Units Subcutaneous Q8H  . insulin aspart  0-15 Units Subcutaneous TID WC  . insulin aspart  0-5 Units Subcutaneous QHS  . lamoTRIgine  100 mg Oral BID  . losartan  25 mg Oral Daily  . mouth rinse  15 mL Mouth Rinse BID  . potassium citrate  10 mEq Oral  BID  . sodium chloride flush  3 mL Intravenous Q12H  . spironolactone  25 mg Oral Daily  . traZODone  100 mg Oral QHS   Continuous Infusions: . sodium chloride       LOS: 8 days    Time spent: 40 minutes    WOODS, Geraldo Docker, MD [facility] Pager [phone]   If 7PM-7AM, please contact night-coverage [email or web address] Password [facility] 10/08/2017, 8:31 AM

## 2017-10-08 NOTE — Progress Notes (Signed)
Came to room to check cpap.  Pt states he is ready to come off cpap.  Placed pt on 3 lpm [place].

## 2017-10-08 NOTE — Progress Notes (Signed)
Progress Note  Patient Name: [name] Date of Encounter: 10/08/2017  Primary Cardiologist: Peter Martinique, MD   Subjective   On Cpap; denies CP or dyspnea  Inpatient Medications    Scheduled Meds: . ARIPiprazole  5 mg Oral Daily  . aspirin  325 mg Oral Daily  . carvedilol  3.125 mg Oral BID WC  . donepezil  10 mg Oral Daily  . FLUoxetine  40 mg Oral Daily  . furosemide  40 mg Oral Daily  . heparin injection (subcutaneous)  5,000 Units Subcutaneous Q8H  . insulin aspart  0-15 Units Subcutaneous TID WC  . insulin aspart  0-5 Units Subcutaneous QHS  . lamoTRIgine  100 mg Oral BID  . losartan  25 mg Oral Daily  . mouth rinse  15 mL Mouth Rinse BID  . potassium citrate  10 mEq Oral BID  . sodium chloride flush  3 mL Intravenous Q12H  . spironolactone  25 mg Oral Daily  . traZODone  100 mg Oral QHS   Continuous Infusions: . sodium chloride     PRN Meds: sodium chloride, acetaminophen, fluticasone, nitroGLYCERIN, ondansetron (ZOFRAN) IV, sodium chloride flush   Vital Signs    Vitals:   10/07/17 2045 10/07/17 2333 10/08/17 0300 10/08/17 0449  BP: 109/60 104/60  (!) 111/58  Pulse: 69 66 66 66  Resp: 18 (!) 22 15 15   Temp:  98.3 F (36.8 C)  (!) 96.5 F (35.8 C)  TempSrc:  Oral  Axillary  SpO2: 90% 95% 92% 93%  Weight:   217 lb 13 oz (98.8 kg)   Height:        Intake/Output Summary (Last 24 hours) at 10/08/2017 0839 Last data filed at 10/07/2017 2333 Gross per 24 hour  Intake 480 ml  Output -  Net 480 ml   Filed Weights   10/06/17 0331 10/07/17 0314 10/08/17 0300  Weight: 222 lb 0.1 oz (100.7 kg) 212 lb 15.4 oz (96.6 kg) 217 lb 13 oz (98.8 kg)    Telemetry    NSR- Personally Reviewed  Physical Exam   GEN: WD Obese NAD Neck: supple, no JVD Cardiac: RRR, no murmur Respiratory: CTA anteriorly GI: Soft, NT/ND, no masses MS: 1-2+ edema Neuro: grossly intact   Labs    Chemistry Recent Labs  Lab 10/04/17 0254  10/06/17 0335 10/07/17 0305  10/08/17 0313  NA 133*   < > 136 140 137  K 4.3   < > 4.2 4.0 3.8  CL 102   < > 102 104 101  CO2 18*   < > 23 26 26   GLUCOSE 126*   < > 93 95 90  BUN 32*   < > 24* 20 15  CREATININE 1.10   < > 0.91 0.78 0.72  CALCIUM 7.9*   < > 7.7* 7.8* 7.8*  PROT 5.9*  --   --  5.2* 5.4*  ALBUMIN 2.7*  --   --  2.3* 2.2*  AST 329*  --   --  209* 116*  ALT 403*  --   --  393* 290*  ALKPHOS 379*  --   --  301* 251*  BILITOT 1.1  --   --  0.7 0.8  GFRNONAA >60   < > >60 >60 >60  GFRAA >60   < > >60 >60 >60  ANIONGAP 13   < > 11 10 10    < > = values in this interval not displayed.     Hematology Recent Labs  Lab 10/05/17 0426 10/06/17 0335 10/07/17 0305  WBC 16.5* 13.1* 12.1*  RBC 3.40* 3.34* 3.50*  HGB 9.7* 9.6* 9.9*  HCT 31.7* 31.5* 32.6*  MCV 93.2 94.3 93.1  MCH 28.5 28.7 28.3  MCHC 30.6 30.5 30.4  RDW 16.4* 16.6* 16.5*  PLT 124* 120* 129*    Cardiac Enzymes Recent Labs  Lab 10/02/17 1848 10/05/17 0426  TROPONINI 5.04* 1.96*     Radiology    Dg Esophagus  Result Date: 10/07/2017 CLINICAL DATA:  Dysphagia EXAM: ESOPHOGRAM/BARIUM SWALLOW TECHNIQUE: Single contrast examination was performed using  thin barium. FLUOROSCOPY TIME:  Fluoroscopy Time:  1 minute 6 seconds Radiation Exposure Index (if provided by the fluoroscopic device): 20.7 mGy Number of Acquired Spot Images: 0 COMPARISON:  10/05/2017 FINDINGS: The examination was performed in the semi upright position. There was normal pharyngeal anatomy and motility. Moderate amount of secretions are noted in the cervical esophagus. Contrast flowed freely through the esophagus without evidence of stricture or mass. No evidence of irregularity or ulceration. Tertiary contractions of the mid and distal thoracic esophagus throughout the examination most concerning for esophageal spasm which delayed transit of contrast. No evidence of reflux. No definite hiatal hernia was demonstrated. IMPRESSION: 1. Tertiary contractions of the mid and  distal thoracic esophagus throughout the examination most concerning for esophageal spasm which delayed transit of contrast. 2. No esophageal foreign body or retained food. Moderate amount of secretions are noted in the cervical esophagus. These results were called by telephone at the time of interpretation on 10/07/2017 at 2:59 pm to Dr. Otis Brace , who verbally acknowledged these results. Electronically Signed   By: Kathreen Devoid   On: 10/07/2017 14:59    Patient Profile     72 y.o. 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  Patient ruled in for a non-ST elevation myocardial infarction and echocardiogram shows newly reduced LV function with ejection fraction 25%.  Assessment & Plan    1 NSTEMI: Plan to continue aspirin, statin and beta-blocker. He remains weak and not ambulating. Needs rehab and physical therapy.  We can consider catheterization if he improves physically.  If he does not improve best option may be medical therapy.  2 Acute systolic heart failure: Patient remains volume overloaded on examination.  Will increase Lasix to 40 mg BID; continue spironolactone 25 mg daily.  Follow renal function closely.  Continue coreg and cozaar.  3 Sepsis-per IM.  4 H/O PE-will change heparin to apixaban when it is clear all procedures complete.  For questions or updates, please contact [place] Please consult [email or web address] for contact info under Cardiology/STEMI.      Signed, Kirk Ruths, MD  10/08/2017, 8:39 AM

## 2017-10-08 NOTE — Progress Notes (Signed)
[facility] Progress Note  [name] 72 y.o. 04/19/1946  CC:  Abnormal barium swallow, dysphagia  Subjective: No acute GI issues noted. Repeat barium swallow yesterday showed no food impaction, no stricture or no mass.  ROS: Positive for weakness. Negative for chest pain. Positive for shortness of breath  Objective: Vital signs in last 24 hours: Vitals:   10/08/17 0908 10/08/17 1050  BP:  126/72  Pulse: 72 85  Resp: (!) 24 (!) 21  Temp:  97.8 F (36.6 C)  SpO2: 97% 96%    Physical Exam:  Gen. Ill-appearing patient. Mild respiratory distress Heart : RRR Abdomen. Soft, nontender, nondistended, bowel sounds present. No peritoneal signs LE:  1-2 + edema   Lab Results: Recent Labs    10/06/17 0335 10/07/17 0305 10/08/17 0313  NA 136 140 137  K 4.2 4.0 3.8  CL 102 104 101  CO2 23 26 26   GLUCOSE 93 95 90  BUN 24* 20 15  CREATININE 0.91 0.78 0.72  CALCIUM 7.7* 7.8* 7.8*  MG 2.4  --   --    Recent Labs    10/07/17 0305 10/08/17 0313  AST 209* 116*  ALT 393* 290*  ALKPHOS 301* 251*  BILITOT 0.7 0.8  PROT 5.2* 5.4*  ALBUMIN 2.3* 2.2*   Recent Labs    10/06/17 0335 10/07/17 0305  WBC 13.1* 12.1*  HGB 9.6* 9.9*  HCT 31.5* 32.6*  MCV 94.3 93.1  PLT 120* 129*   Recent Labs    10/07/17 0305  LABPROT 16.6*  INR 1.36      Assessment/Plan: - Abnormal barium swallow concerning for retained food bolus with possible distal esophageal stricture. - Acute heart failure - shortness of breath - NSTEMI  - Elevated LFTs. trending down  ?? Congestive hepatopathy/  Ischemic hepatitis  Recommendations ------------------------- - Repeat barium swallow today showed no evidence of food bolus or food impaction. It showed significant tertiary contraction. No evidence of stricture or mass. - Okay to advance diet from GI standpoint.  - okay  start anticoagulation from GI standpoint as we are not planning for any endoscopic procedure at this time -  Hepatitis panel pending. LFTs trending down. CT abdomen on admission showed normal-appearing Liver,  pancreas. Normal common bile duct.  -  Follow-up with Dr. [name] in 6-8 weeks after discharge to monitor LFTs and hemoglobin as well as further management of his chronic dysphagia - GI will sign off. Call us back if needed   Otis Brace MD, [place] 10/08/2017, 11:10 AM  Contact #  [phone]

## 2017-10-08 NOTE — Evaluation (Signed)
Physical Therapy Evaluation Patient Details Name: [name] MRN: [id number] DOB: 1946/08/13 Today's Date: 10/08/2017   History of Present Illness  72 y.o. male admitted on 09/30/17 for weakness, dx with sepsis and NSTEMI, and acute systolic heart failure.  Pt with significant PMH of stroke, seizures, PE, HTN, DM, CAD, back pain, A-flutter, AKI, cervical fusion and back surgery.   Clinical Impression  Pt is very weak and deconditioned.  Pt reports he could walk prior to this event that brought him to the hospital.  He walks with a rollator at baseline and lives with his wife.  He is unable to stand currently without two person assist.  He tolerated ~20 mins EOB with me with vitals improved in sitting compared to supine.      Follow Up Recommendations CIR    Equipment Recommendations  Hospital bed    Recommendations for Other Services Rehab consult;OT consult     Precautions / Restrictions Precautions Precautions: Fall      Mobility  Bed Mobility Overal bed mobility: Needs Assistance Bed Mobility: Supine to Sit;Sit to Supine     Supine to sit: Mod assist;HOB elevated Sit to supine: Max assist   General bed mobility comments: Heavy mod assist to get to EOB with HOB maximally elevated.  Assist needed to help transition legs to EOB and help support trunk to pull up to sititng EOB.  Pt needed max assist to return to supine as he had gravity assist in lowering his trunk and was unable to assist in lifting his legs back up into bed.   Transfers                 General transfer comment: Unable to attempt alone, per RN it took two of them earlier today to get OOB in the recliner chair and it was max assist.   Ambulation/Gait             General Gait Details: unable at this time, however, he reports that he was ambulatory PTA.         Balance Overall balance assessment: Needs assistance Sitting-balance support: Feet supported;Bilateral upper extremity  supported Sitting balance-Leahy Scale: Fair Sitting balance - Comments: Pt sat >10 mins EOB on 3 L O2 [place] with O2 sats improved and HR, RR stable.  Props with both hands and often leans over to his right.   Postural control: Right lateral lean                                   Pertinent Vitals/Pain Pain Assessment: Faces Faces Pain Scale: Hurts even more Pain Location: upper/mid back Pain Descriptors / Indicators: Grimacing;Guarding Pain Intervention(s): Limited activity within patient's tolerance;Monitored during session;Repositioned    Home Living Family/patient expects to be discharged to:: Private residence Living Arrangements: Spouse/significant other Available Help at Discharge: Family;Available 24 hours/day;Personal care attendant Type of Home: House Home Access: Ramped entrance     Home Layout: One level(per pt report, however, last admission reported 2 stories) Home Equipment: Shower seat;Grab bars - tub/shower;Hand held shower head;Walker - 4 wheels;Walker - 2 wheels;Adaptive equipment;Other (comment) Additional Comments: has an aide 3 hours a day for showers and LB dressing (per previous admission note, pt did not report aide this admission, but he is also a poor historian).  Uses 3 L O2 [place] at home all the time.     Prior Function Level of Independence: Needs assistance  Gait / Transfers Assistance Needed: per pt report uses his rollator in the house, he does not drive           Hand Dominance   Dominant Hand: Left    Extremity/Trunk Assessment   Upper Extremity Assessment Upper Extremity Assessment: Defer to OT evaluation    Lower Extremity Assessment Lower Extremity Assessment: Generalized weakness(bil LE edema, equal strength bil, ankle 3-, knee 3-, hips 2)    Cervical / Trunk Assessment Cervical / Trunk Assessment: Kyphotic  Communication   Communication: HOH  Cognition Arousal/Alertness: Lethargic(lethargic initially, until EOB, then  better) Behavior During Therapy: Flat affect Overall Cognitive Status: No family/caregiver present to determine baseline cognitive functioning                                 General Comments: Per chart he has memory deficits at baseline and he is also very HOH making it difficult to tell if he just can't hear what is being said vs true cognitive deficits.  He does seem to be slow to process.          Exercises General Exercises - Lower Extremity Long Arc Quad: AROM;Both;5 reps;Seated Toe Raises: AROM;Both;5 reps;Seated   Assessment/Plan    PT Assessment Patient needs continued PT services  PT Problem List Decreased strength;Decreased activity tolerance;Decreased balance;Decreased mobility;Decreased knowledge of use of DME;Decreased cognition;Cardiopulmonary status limiting activity;Obesity;Pain       PT Treatment Interventions DME instruction;Gait training;Functional mobility training;Therapeutic activities;Therapeutic exercise;Balance training;Patient/family education    PT Goals (Current goals can be found in the Care Plan section)  Acute Rehab PT Goals Patient Stated Goal: none stated PT Goal Formulation: Patient unable to participate in goal setting Time For Goal Achievement: 10/22/17 Potential to Achieve Goals: Fair    Frequency Min 3X/week           AM-PAC PT "6 Clicks" Daily Activity  Outcome Measure Difficulty turning over in bed (including adjusting bedclothes, sheets and blankets)?: Unable Difficulty moving from lying on back to sitting on the side of the bed? : Unable Difficulty sitting down on and standing up from a chair with arms (e.g., wheelchair, bedside commode, etc,.)?: Unable Help needed moving to and from a bed to chair (including a wheelchair)?: Total Help needed walking in hospital room?: Total Help needed climbing 3-5 steps with a railing? : Total 6 Click Score: 6    End of Session Equipment Utilized During Treatment: Oxygen(3 L O2  [place]) Activity Tolerance: Patient limited by fatigue;Patient limited by pain;Patient limited by lethargy Patient left: in bed;with call bell/phone within reach;with bed alarm set Nurse Communication: Mobility status PT Visit Diagnosis: Muscle weakness (generalized) (M62.81);Difficulty in walking, not elsewhere classified (R26.2)    Time: 1610-9604 PT Time Calculation (min) (ACUTE ONLY): 28 min   Charges:         Lurena Joiner B. Malyia Moro, PT, DPT [phone]   PT Evaluation $PT Eval Moderate Complexity: 1 Mod PT Treatments $Therapeutic Activity: 8-22 mins   10/08/2017, 4:40 PM

## 2017-10-08 NOTE — Significant Event (Addendum)
Rapid Response Event Note  Overview:  Call Time 2154 Arrival Time 2157  Initial Focused Assessment: Called by RN about patient coughing after taking POs, Per RN, patient's lung sounds rhonchi throughout.  Upon arrival, patient was alert, able to follow commands, generalized weakness, not in acute respiratory distress, lung sounds rhonchi throughout, crackles L > R not SOB and no increased WOB, RR 16-18.  SR in the 60-70s, SBP 110-120, + pulses, sats maintaining greater than 96% on 3L, patient wears CPAP at nightly.   Interventions: -- CXR STAT -- could benefit from some lasix (Per RN dose was increased today to BID, last dose given at 1730). -- Leave NPO for now, patient might be aspirating since coughing started with taking POs.  -- Patient able to 250-750 on IS.  -- Paged [employer] PA at 2213, call back at 2218, PA to see patient, will order Lasix, and ABXs.  Plan of Care (if not transferred): -- will follow up with CXR -- call RRT if needed   Event Summary:    at    End Time [place], [place]

## 2017-10-08 NOTE — Progress Notes (Signed)
Rehab Admissions Coordinator Note:  Patient was screened by Cleatrice Burke for appropriateness for an Inpatient Acute Rehab Consult per PT recommendation.   At this time, we are recommending follow his tolerance for more therapy before pursuing an inpt rehab consult. I will follow.Cleatrice Burke 10/08/2017, 4:42 PM  I can be reached at [phone]

## 2017-10-09 ENCOUNTER — Inpatient Hospital Stay (HOSPITAL_COMMUNITY): Payer: Medicare Other

## 2017-10-09 DIAGNOSIS — R29898 Other symptoms and signs involving the musculoskeletal system: Secondary | ICD-10-CM

## 2017-10-09 DIAGNOSIS — E7849 Other hyperlipidemia: Secondary | ICD-10-CM

## 2017-10-09 LAB — BASIC METABOLIC PANEL
Anion gap: 13 (ref 5–15)
BUN: 11 mg/dL (ref 6–20)
CO2: 27 mmol/L (ref 22–32)
CREATININE: 0.7 mg/dL (ref 0.61–1.24)
Calcium: 8 mg/dL — ABNORMAL LOW (ref 8.9–10.3)
Chloride: 96 mmol/L — ABNORMAL LOW (ref 101–111)
GFR calc Af Amer: >60 mL/min (ref >60)
Glucose, Bld: 102 mg/dL — ABNORMAL HIGH (ref 65–99)
Potassium: 3.8 mmol/L (ref 3.5–5.1)
SODIUM: 136 mmol/L (ref 135–145)

## 2017-10-09 LAB — GLUCOSE, CAPILLARY
GLUCOSE-CAPILLARY: 103 mg/dL — AB (ref 65–99)
Glucose-Capillary: 104 mg/dL — ABNORMAL HIGH (ref 65–99)
Glucose-Capillary: 132 mg/dL — ABNORMAL HIGH (ref 65–99)
Glucose-Capillary: 112 mg/dL — ABNORMAL HIGH (ref 65–99)

## 2017-10-09 MED ORDER — APIXABAN 5 MG PO TABS
5.0000 mg | ORAL_TABLET | Freq: Two times a day (BID) | ORAL | Status: DC
  Administered 2017-10-09 – 2017-10-11 (×4): 5 mg via ORAL
  Filled 2017-10-09 (×4): qty 1

## 2017-10-09 MED ORDER — FUROSEMIDE 80 MG PO TABS
80.0000 mg | ORAL_TABLET | Freq: Two times a day (BID) | ORAL | Status: DC
  Administered 2017-10-09 – 2017-10-11 (×4): 80 mg via ORAL
  Filled 2017-10-09 (×4): qty 1

## 2017-10-09 NOTE — Clinical Social Work Note (Signed)
Clinical Social Work Assessment  Patient Details  Name: [name] MRN: [id number] Date of Birth: 04-21-1946  Date of referral:  10/09/17               Reason for consult:  Facility Placement                Permission sought to share information with:  Facility Sport and exercise psychologist, Family Supports Permission granted to share information::  No(pt disoriented- spoke with next of kin)  Name::     [place]::  SNF, [place]  Relationship::  wife and Radio broadcast assistant Information:     Housing/Transportation Living arrangements for the past 2 months:  [place], Single Family Home([facility]) Source of Information:  Spouse Patient Interpreter Needed:  None Criminal Activity/Legal Involvement Pertinent to Current Situation/Hospitalization:  No - Comment as needed Significant Relationships:  Adult Children, Spouse Lives with:  Spouse Do you feel safe going back to the place where you live?  No Need for family participation in patient care:  Yes (Comment)(decision making and assistance with ADLs at home)  Care giving concerns:  Pt lives at home with wife.  Was DC'd from hospital 1/7 and went to [facility]- was there 1 day prior to transfer to [place] in [place] for rehab.  Wife reports pt was mobile with walker when returned home but now with big change in mobility and wife does not think his needs can be managed at home.   Social Worker assessment / plan:  CSW spoke with pt, pt wife, and pt dtr concerning DC planning.  Pt alert and looked at [place] upon entering but did not answer to being addressed and did not speak at all during interview.  CSW discussed PT recommendation for pt to transfer to inpatient rehab program when [facility] from the hospital.    Wife inquired about CIR program so pt could be close to MDs she is concerned about pt having new medical concerns while out of hospital and there being complications.  CSW explained CIR and that they did not think pt currently  appropriate for their program- assured wife that if his endurance changes prior to DC they could re-eval but currently not an option.  CSW discussed SNF as alternative option.  Wife hopeful pt can be placed more locally- was at [facility] for 1 day but wife had pt transferred to [place] in [place] because she was unimpressed with [facility]- is worried another facility would have similar results.  CSW discussed that we could fax out referral using Medicare benefit and I could also [facility] to see if [facility] could [facility].    Employment status:  Retired Forensic scientist:  Information systems manager, [place] Benefit PT Recommendations:  Inpatient Rehab Consult(CIR does not think a good candidate at this time- does not think can tolerate 3 hours of therapy) Information / Referral to community resources:  [place]  Patient/Family's Response to care:  Wife agreeable to [place] faxing to local options through Medicare as well as contacting [place].  Pt wife would like for pt to be close but prioritizes quality of care.  Patient/Family's Understanding of and Emotional Response to Diagnosis, Current Treatment, and Prognosis:  Pt wife seems to be knowledgeable about pt condition but per MD is not realistic about the severity of his condition and likelihood that he will continue to decline medically.  Emotional Assessment Appearance:  Appears stated age Attitude/Demeanor/Rapport:    Affect (typically observed):  Quiet Orientation:  Oriented to Self, Oriented to Place, Oriented to Situation Alcohol / Substance use:  Not Applicable Psych involvement (Current and /or in the community):  No (Comment)  Discharge Needs  Concerns to be addressed:  Care Coordination Readmission within the last 30 days:  Yes Current discharge risk:  Physical Impairment Barriers to Discharge:  Continued Medical Work up   Jorge Ny, LCSW 10/09/2017, 3:36 PM

## 2017-10-09 NOTE — Evaluation (Signed)
Clinical/Bedside Swallow Evaluation Patient Details  Name: [name] MRN: [id number] Date of Birth: May 12, 1946  Today's Date: 10/09/2017 Time: SLP Start Time (ACUTE ONLY): 1120 SLP Stop Time (ACUTE ONLY): 1133 SLP Time Calculation (min) (ACUTE ONLY): 13 min  Past Medical History:  Past Medical History:  Diagnosis Date  . AKI (acute kidney injury) ([place])    a. 6/14: resolved after d/c ARB;  b. RA U/S 7/14: no RA stenosis  . Arthritis    of spine  . Atrial flutter ([place])    a. 2008 - one episode in setting of sepsis/shock.  . Back pain    persistent  . Coronary artery disease    a. s/p CABG 2005. b. low risk nuc 2016.  [place] Kitchen Depression   . Diabetes mellitus    controled by diet  . Diverticulitis    a. s/p partial colectomy remotely.  Marland Kitchen GERD (gastroesophageal reflux disease)   . H/O alcohol abuse   . Headache(784.0)   . Hearing loss   . History of hiatal hernia   . Hx of echocardiogram    Echo 7/14:  Mod LVH, EF 55-60%, Gr 1 DD, mild MR, PASP 36  . Hyperlipidemia   . Hypertension   . Pulmonary embolism ([place]) 2005  . Renal stone 04/15/2014  . Seizures ([place])   . Sleep apnea    does not use CPAP  . Stroke [facility])    Past Surgical History:  Past Surgical History:  Procedure Laterality Date  . APPENDECTOMY    . BACK SURGERY    . CARDIAC CATHETERIZATION    . CERVICAL FUSION    . CORONARY ARTERY BYPASS GRAFT  2005   LIMA graft to LAD,saphenous vein graft to diag.,circumflex, marginal,and to the RCA  . CYSTOSCOPY/RETROGRADE/URETEROSCOPY/STONE EXTRACTION WITH BASKET Left 04/16/2014   Procedure: CYSTOSCOPY/RETROGRADE/LEFT URETEROSCOPY/STONE EXTRACTION WITH BASKET AND LASER LITHOTRIPSY;  Surgeon: Malka So, MD;  Location: [facility];  Service: Urology;  Laterality: Left;  With STENT  . ESOPHAGOGASTRODUODENOSCOPY  02/10/2012   Procedure: ESOPHAGOGASTRODUODENOSCOPY (EGD);  Surgeon: Cleotis Nipper, MD;  Location: [facility] ENDOSCOPY;  Service: Endoscopy;  Laterality: N/A;  .  ESOPHAGOGASTRODUODENOSCOPY  06/29/2012   Procedure: ESOPHAGOGASTRODUODENOSCOPY (EGD);  Surgeon: Winfield Cunas., MD;  Location: [facility];  Service: Endoscopy;  Laterality: N/A;  . FOREIGN BODY REMOVAL  02/10/2012   Procedure: FOREIGN BODY REMOVAL;  Surgeon: Cleotis Nipper, MD;  Location: [facility];  Service: Endoscopy;  Laterality: N/A;  . FOREIGN BODY REMOVAL  06/29/2012   Procedure: FOREIGN BODY REMOVAL;  Surgeon: Winfield Cunas., MD;  Location: [facility] ENDOSCOPY;  Service: Endoscopy;  Laterality: N/A;  . LEFT HEART CATHETERIZATION WITH CORONARY ANGIOGRAM N/A 04/02/2012   Procedure: LEFT HEART CATHETERIZATION WITH CORONARY ANGIOGRAM;  Surgeon: Sinclair Grooms, MD;  Location: [facility] CATH LAB;  Service: Cardiovascular;  Laterality: N/A;  . LOOP RECORDER IMPLANT N/A 11/17/2013   Procedure: LOOP RECORDER IMPLANT;  Surgeon: Deboraha Sprang, MD;  Location[facility] CATH LAB;  Service: Cardiovascular;  Laterality: N/A;  . ORCHIECTOMY    . PARTIAL COLECTOMY     for diverticuli  . SAVORY DILATION  06/29/2012   Procedure: SAVORY DILATION;  Surgeon: Winfield Cunas., MD;  Location: [facility];  Service: Endoscopy;  Laterality: N/A;  . TEE WITHOUT CARDIOVERSION N/A 11/17/2013   Procedure: TRANSESOPHAGEAL ECHOCARDIOGRAM (TEE);  Surgeon: Larey [facility]er, MD;  Location: [facility];  Service: Cardiovascular;  Laterality: N/A;  . UMBILICAL HERNIA REPAIR     HPI:  72 yo WM PMHxseizures, depression,CVA; OSA not on CPAP; seizures; HTN; HLD; Dementia; ETOH abuse; DM(diet-controlled); CAD s/p CABG 6269; chronic diastolic CHF 4854, atrial flutter, AKI, Diverticulitis s/p partial colectomy, hiatal hernia, PE 2005, nephrolithiasis 04/15/2014. SLP evaluated 11/15/13 with findings of functional oropharygneal swallow without evidence of aspiration. Regular diet with thin liquids was recommended. Noted history of esophageal dysphagia, s/p balloon dilation 10/06/13.  Bedside swallow eval 10/04/17 revealed concerns for  primary esophageal dysphagia.  2/09: DGEsophagus:Large retained fluid bolus and possible food impaction noted. This food bolus moved from the upper thoracic esophagus to the lower esophagus during the study. There is poor distention and expansion of the distal esophagus.-Cannot exclude distal esophageal stricture. Severe dysmotility. Repeat esophagram 2/12: no evidence of food bolus orfood impaction. It showed significant tertiary contraction. No evidence of stricture or mass. GI recommended advancing diet; pt started clear liquids; then noted to be coughing, developed rales/rhonchi and repeat SLP evaluation was ordered.     Assessment / Plan / Recommendation Clinical Impression  Pt with persisting cough associated with consumption of thin liquids.  No regurgitation noted today as described in previous assessment. No focal deficits.  Adequate oral anticipation and management; swallow is brisk; cough is consistent.  Recommend proceeding with MBS to delineate if dysphagia is related to cough.  D/W RN.  SLP Visit Diagnosis: Dysphagia, unspecified (R13.10)    Aspiration Risk    TBD   Diet Recommendation   hold POs pending MBS       Other  Recommendations     Follow up Recommendations        Frequency and Duration            Prognosis        Swallow Study   General HPI: 72 yo WM PMHxseizures, depression,CVA; OSA not on CPAP; seizures; HTN; HLD; Dementia; ETOH abuse; DM(diet-controlled); CAD s/p CABG 6270; chronic diastolic CHF 3500, atrial flutter, AKI, Diverticulitis s/p partial colectomy, hiatal hernia, PE 2005, nephrolithiasis 04/15/2014. SLP evaluated 11/15/13 with findings of functional oropharygneal swallow without evidence of aspiration. Regular diet with thin liquids was recommended. Noted history of esophageal dysphagia, s/p balloon dilation 10/06/13.  Bedside swallow eval 10/04/17 revealed concerns for primary esophageal dysphagia.  2/09: DGEsophagus:Large retained fluid bolus  and possible food impaction noted. This food bolus moved from the upper thoracic esophagus to the lower esophagus during the study. There is poor distention and expansion of the distal esophagus.-Cannot exclude distal esophageal stricture. Severe dysmotility. Repeat esophagram 2/12: no evidence of food bolus orfood impaction. It showed significant tertiary contraction. No evidence of stricture or mass. GI recommended advancing diet; pt started clear liquids; then noted to be coughing, developed rales/rhonchi and repeat SLP evaluation was ordered.   Type of Study: Bedside Swallow Evaluation Previous Swallow Assessment: see HPI Diet Prior to this Study: NPO Temperature Spikes Noted: No Respiratory Status: Nasal cannula History of Recent Intubation: No Behavior/Cognition: Alert Oral Care Completed by SLP: No Oral Cavity - Dentition: Missing dentition;Poor condition Vision: Functional for self-feeding Self-Feeding Abilities: Needs assist Patient Positioning: Upright in bed Baseline Vocal Quality: Normal Volitional Swallow: Able to elicit    Oral/Motor/Sensory Function Overall Oral Motor/Sensory Function: Within functional limits   Ice Chips Ice chips: Within functional limits   Thin Liquid Thin Liquid: Impaired Presentation: Straw Pharyngeal  Phase Impairments: Cough - Delayed    Nectar Thick Nectar Thick Liquid: Not tested   Honey Thick Honey Thick Liquid: Not tested   Puree Puree: Within functional limits  Solid   GO   Solid: Not tested        [name] 10/09/2017,11:46 AM

## 2017-10-09 NOTE — Progress Notes (Signed)
PROGRESS NOTE    MAGUIRE SIME  FXT:[id number] DOB: November 16, 1945 DOA: 09/30/2017 PCP: Bernell List, MD   Brief Narrative:  72 yo WM PMHx  Seizures, depression,CVA; OSA not on CPAP; seizures; HTN; HLD; Dementia;  ETOH abuse; DM(diet-controlled); CAD s/p CABG 2992; chronic diastolic CHF 4268, atrial flutter, AKI, Diverticulitis  s/p partial colectomy, hiatal hernia, PE 2005, nephrolithiasis 04/15/2014  Presenting with weakness.  Initially, the patient provided the history and said "I don't know" what brought him in.  He started feeling bad 2-3 years ago.  Started feeling like he does today 2-3 weeks ago.  Can't eat.  No SOB.  Occasional cough, not new, productive of clear sputum.  No fever at home.  +headache x 2-3 years, confusion x 2-3 months.  Hurts left lower back.  No chest pain, palpitations.  No abdominal pain.  No N/V/D/C.   No rash.  +weakness.  Occasional dysphagia.   Based on minimal meaningful history provided by the patient, I called and spoke to his wife by telephone.  He was previously admitted here from 1/1-7 with C. Diff colitis and discharged on oral vancomycin; he has troponin elevation thought to be related to demand ischemia and also had a BIPAP requirement qhs, possibly due to diastolic heart failure.  He left and was sent to [facility] and then he was transferred to the [facility] in [place] for rehab.  His wife brought him home a week ago and he was negative for C diff prior to discharge.  2 days ago he started walking slower and leaning to his left.  Last night, he was unable to stand for his shower.  He got out of the bed overnight and fell onto his buttocks.  His wife was able to get him up and back in bed but then it happened again.  Her grandson was able to pick him up and they placed him in a lift recliner.  He was "confused with his feet."  Prior CVA with left-sided weakness but now with apparent left-sided weakness.  He has been turning his feet inward but he did not understand what  she was saying.  He did not have a fever at home until just as she called EMS.  He is intermittently confused at home.  Yesterday, he was breathing in a pattern that was bothersome to the family - "he sucks air in through his mouth and swallows it and has to burp or pass gas and he did that all day long yesterday."  No cough until today.  ?productive sputum.  Urine with an odor today.  +diarrhea but this is chronic after colonic resection; this has been better than when he had C diff.       ED Course: Sepsis - started on Vanc/Levaquin/Aztreonam.  Uncertain source - flu negative.  CXR and CTA negative.  Urine unremarkable.  Cardiology consulted - no cath while floridly septic, start Heparin, they will follow.    Subjective: 2/14 A/O 2 (does not know when, why), negative SOB, negative abdominal pain, negative CP, negative N/V. Appears to be in better spirits joking with staff.     Assessment & Plan:   Principal Problem:   Sepsis ([place]) Active Problems:   Diabetes mellitus ([place])   Obstructive sleep apnea   Essential hypertension   NSTEMI (non-ST elevated myocardial infarction) ([place])   Dementia   Sepsis unspecified organism  -Complete seven-day course antibiotics, except will DC vancomycin.as MRSA negative by PCR and blood cultures negative - 2/9 repeat  PCXR: Inconclusive for aspiration pneumonia, however current antibiotics should cover most organisms. In addition patient afebrile and clinically improving from respiratory status -Recent C. difficile infection (treated): Currently asymptomatic for C. difficile infection. DC precautions -Patient extremely weak. Completed course antibiotics. Panculture negative. Acute hepatitis panel negative. All antibiotics discontinued.    SBO vs/Ileus/ Esophageal stricture/Esophageal dysmotility -KUB negative for SBO/ileus -Barium swallow: Positive for impacted food/esophageal stricture see results below -[facility] was contacted and repeated barium  swallow. Repeat barium swallow showed no impacted esophageal food/esophageal mass/esophageal strictures see results below. -Patient tolerating clear liquid diet. Will advance to full liquid on 2/15   NSTEMI -Currently no chest pain however patient extremely fatigued  -Initial EKG with ST depressions concerning for global ischemia -TIMI risk score= 5 -Troponin significantly elevated but trending down continue to monitor Recent Labs  Lab 10/02/17 1848 10/05/17 0426  TROPONINI 5.04* 1.96*  -ASA 325 mg daily -Lipitor 40 mg daily -2/14 DC heparin start Apixaban per cardiology recommendation. No further procedures anticipated.  -Coreg 3.125 mg  BID -Lasix 80 mg BID -Losartan 5 mg daily -Spironolactone 25 mg daily -2/6 Echocardiogram; worsening EF when compared to echocardiogram from 08/29/2017 (EF 50-55%) now EF only 20-25%. See results below  -Per cardiology cardiac catheterization placed on hold secondary to patient's debilitated state. Possible cardiac catheterization in the future after patient improves at SNF  Acute on chronic systolic CHF -Strict in and out since admission +2.4 L -Daily weight Filed Weights   10/07/17 0314 10/08/17 0300 10/09/17 0255  Weight: 212 lb 15.4 oz (96.6 kg) 217 lb 13 oz (98.8 kg) 216 lb 14.9 oz (98.4 kg)  -see NSTEMI  Pulmonary hypertension -See NSTEMI  Essential HTN  -unsure of patient's compliance at home with medication -See NSTEMI    Diabetes type 2 uncontrolled with complication -1/[id number] A1c= 7.1 -Moderate SSI  HLD  -Lipid panel not within ADA/AHA guidelines -Lipitor 40 mg daily  Dementia  -Must be early onset able answer all questions appropriately -Continue Aricept, Abilify, Lamictal, and Trazodone   OSA -Not on home CPAP -CPAP per respiratory  Deconditioned -out of bed to chair Q shift -Ambulate Q shift   Goals of care -if patient family does not want Pt to go to SNF or not approved for SNF have spoken with NCM Kristi  and will look at patient going home on [facility].     DVT prophylaxis: Heparin drip--> Eliquis Code Status: Full Family Communication: None Disposition Plan: TBD   Consultants:  Cardiology GI     Procedures/Significant Events:  2/6 Echocardiogram:- Left ventricle: - mild LVH. -LVEF = 20% to 25%. - Mitral valve: mild to moderate regurgitation. - Tricuspid valve:  moderate regurgitation. - Pulmonary arteries:  PA peak pressure: 42 mm Hg (S). 2/9 PCXR: Negative aspiration, pneumonia 2/9 KUB: Negative SBO/ileus 2/9 DG Esophagus:Large retained fluid bolus and possible food impaction noted. This food bolus moved from the upper thoracic esophagus to the lower esophagus during the study. There is poor distention and expansion of the distal esophagus. -Cannot exclude distal esophageal stricture. -Severe dysmotility. 2/12 repeat Barium Swallow;  -no evidence of food bolus or food impaction. -Showed significant tertiary contraction. -No evidence of stricture or mass.      I have personally reviewed and interpreted all radiology studies and my findings are as above.  VENTILATOR SETTINGS:    Cultures 2/5 blood negative 2/5 urine negative 2/5 MRSA by PCR negative 2/5 respiratory virus panel negative 2/12 acute hepatitis panel negative  Antimicrobials: Anti-infectives (From admission, onward)   Start     Stop   10/06/17 1600  aztreonam (AZACTAM) 1 g in sodium chloride 0.9 % 100 mL IVPB  Status:  Discontinued     10/06/17 1437   10/01/17 0800  levofloxacin (LEVAQUIN) IVPB 750 mg  Status:  Discontinued     10/06/17 1437   09/30/17 2200  vancomycin (VANCOCIN) IVPB 750 mg/150 ml premix  Status:  Discontinued     10/05/17 1900   09/30/17 1600  aztreonam (AZACTAM) 1 g in dextrose 5 % 50 mL IVPB  Status:  Discontinued     10/06/17 0841   09/30/17 0715  levofloxacin (LEVAQUIN) IVPB 750 mg     09/30/17 1059   09/30/17 0715  aztreonam (AZACTAM) 2 g in dextrose 5 % 50  mL IVPB     09/30/17 0900   09/30/17 0715  vancomycin (VANCOCIN) IVPB 1000 mg/200 mL premix  Status:  Discontinued     09/30/17 0706   09/30/17 0715  vancomycin (VANCOCIN) 2,000 mg in sodium chloride 0.9 % 500 mL IVPB     09/30/17 1530       Devices    LINES / TUBES:      Continuous Infusions: . sodium chloride       Objective: Vitals:   10/08/17 2352 10/09/17 0255 10/09/17 0439 10/09/17 0755  BP: 124/66  111/61 109/61  Pulse: 66  64 63  Resp: 15  18 13   Temp: 99.2 F (37.3 C)  98.8 F (37.1 C) 98.1 F (36.7 C)  TempSrc: Axillary  Axillary Axillary  SpO2: 96%  99% 94%  Weight:  216 lb 14.9 oz (98.4 kg)    Height:        Intake/Output Summary (Last 24 hours) at 10/09/2017 0824 Last data filed at 10/09/2017 0440 Gross per 24 hour  Intake 840 ml  Output 3852 ml  Net -3012 ml   Filed Weights   10/07/17 0314 10/08/17 0300 10/09/17 0255  Weight: 212 lb 15.4 oz (96.6 kg) 217 lb 13 oz (98.8 kg) 216 lb 14.9 oz (98.4 kg)   Physical Exam:  General: A/O 2 (does not know when, why), No acute respiratory distress Neck:  Negative scars, masses, torticollis, lymphadenopathy, JVD Lungs: Clear to auscultation bilaterally without wheezes or crackles Cardiovascular: Regular rate and rhythm without murmur gallop or rub normal S1 and S2 Abdomen: Obese, negative abdominal pain, nondistended, positive soft, bowel sounds, no rebound, no ascites, no appreciable mass Extremities: No significant cyanosis, clubbing, or edema bilateral lower extremities Skin: Negative rashes, lesions, ulcers Psychiatric:  Negative depression, negative anxiety, negative fatigue, negative mania  Central nervous system:  Cranial nerves II through XII intact, tongue/uvula midline, all extremities muscle strength 5/5, sensation intact throughout, negative dysarthria, negative expressive aphasia, negative receptive aphasia.     Data Reviewed: Care during the described time interval was provided by me .  I  have reviewed this patient's available data, including medical history, events of note, physical examination, and all test results as part of my evaluation.   CBC: Recent Labs  Lab 10/04/17 0254 10/05/17 0426 10/06/17 0335 10/07/17 0305 10/08/17 2301  WBC 13.9* 16.5* 13.1* 12.1* 10.8*  NEUTROABS  --   --   --   --  7.6  HGB 10.5* 9.7* 9.6* 9.9* 9.8*  HCT 33.9* 31.7* 31.5* 32.6* 31.4*  MCV 92.4 93.2 94.3 93.1 91.8  PLT 132* 124* 120* 129* 891   Basic Metabolic Panel: Recent Labs  Lab 10/03/17 727 115 6790  10/04/17 0254 10/05/17 0426 10/06/17 0335 10/07/17 0305 10/08/17 0313 10/08/17 2301 10/09/17 0421  NA 134* 133* 135 136 140 137 131* 136  K 5.2* 4.3 4.4 4.2 4.0 3.8 3.7 3.8  CL 103 102 102 102 104 101 96* 96*  CO2 17* 18* 23 23 26 26 22 27   GLUCOSE 122* 126* 86 93 95 90 101* 102*  BUN 37* 32* 27* 24* 20 15 11 11   CREATININE 1.15 1.10 0.92 0.91 0.78 0.72 0.62 0.70  CALCIUM 7.8* 7.9* 7.7* 7.7* 7.8* 7.8* 7.6* 8.0*  MG 2.3 2.3 2.4 2.4  --   --   --   --    GFR: Estimated Creatinine Clearance: 91.9 mL/min (by C-G formula based on SCr of 0.7 mg/dL). Liver Function Tests: Recent Labs  Lab 10/04/17 0254 10/07/17 0305 10/08/17 0313  AST 329* 209* 116*  ALT 403* 393* 290*  ALKPHOS 379* 301* 251*  BILITOT 1.1 0.7 0.8  PROT 5.9* 5.2* 5.4*  ALBUMIN 2.7* 2.3* 2.2*   No results for input(s): LIPASE, AMYLASE in the last 168 hours. No results for input(s): AMMONIA in the last 168 hours. Coagulation Profile: Recent Labs  Lab 10/07/17 0305  INR 1.36   Cardiac Enzymes: Recent Labs  Lab 10/02/17 1848 10/05/17 0426  TROPONINI 5.04* 1.96*   BNP (last 3 results) No results for input(s): PROBNP in the last 8760 hours. HbA1C: No results for input(s): HGBA1C in the last 72 hours. CBG: Recent Labs  Lab 10/08/17 0603 10/08/17 1119 10/08/17 1622 10/08/17 2152 10/09/17 0555  GLUCAP 90 158* 95 103* 103*   Lipid Profile: No results for input(s): CHOL, HDL, LDLCALC, TRIG,  CHOLHDL, LDLDIRECT in the last 72 hours. Thyroid Function Tests: No results for input(s): TSH, T4TOTAL, FREET4, T3FREE, THYROIDAB in the last 72 hours. Anemia Panel: No results for input(s): VITAMINB12, FOLATE, FERRITIN, TIBC, IRON, RETICCTPCT in the last 72 hours. Urine analysis:    Component Value Date/Time   COLORURINE YELLOW 09/30/2017 [place] 09/30/2017 1255   LABSPEC >1.046 (H) 09/30/2017 1255   PHURINE 5.0 09/30/2017 1255   GLUCOSEU NEGATIVE 09/30/2017 1255   HGBUR MODERATE (A) 09/30/2017 1255   BILIRUBINUR NEGATIVE 09/30/2017 1255   BILIRUBINUR neg 04/27/2014 1205   KETONESUR NEGATIVE 09/30/2017 1255   PROTEINUR 30 (A) 09/30/2017 1255   UROBILINOGEN 0.2 03/07/2015 1005   NITRITE NEGATIVE 09/30/2017 1255   LEUKOCYTESUR NEGATIVE 09/30/2017 1255   Sepsis Labs: @LABRCNTIP (procalcitonin:4,lacticidven:4)  ) Recent Results (from the past 240 hour(s))  Culture, blood (routine x 2)     Status: None   Collection Time: 09/30/17  7:27 AM  Result Value Ref Range Status   Specimen Description BLOOD LEFT ANTECUBITAL  Final   Special Requests   Final    BOTTLES DRAWN AEROBIC AND ANAEROBIC Blood Culture adequate volume   Culture   Final    NO GROWTH 5 DAYS Performed at [facility], [place] [address], [place], [place] [address]    Report Status 10/05/2017 FINAL  Final  Culture, blood (routine x 2)     Status: None   Collection Time: 09/30/17  7:27 AM  Result Value Ref Range Status   Specimen Description BLOOD RIGHT WRIST  Final   Special Requests   Final    BOTTLES DRAWN AEROBIC AND ANAEROBIC Blood Culture adequate volume   Culture   Final    NO GROWTH 5 DAYS Performed at [facility], [place] [address], [place], [place] [address]  Report Status 10/05/2017 FINAL  Final  Respiratory Panel by PCR     Status: None   Collection Time: 09/30/17  8:54 AM  Result Value Ref Range Status   Adenovirus NOT DETECTED NOT DETECTED Final   Coronavirus 229E NOT  DETECTED NOT DETECTED Final   Coronavirus HKU1 NOT DETECTED NOT DETECTED Final   Coronavirus NL63 NOT DETECTED NOT DETECTED Final   Coronavirus OC43 NOT DETECTED NOT DETECTED Final   Metapneumovirus NOT DETECTED NOT DETECTED Final   Rhinovirus / Enterovirus NOT DETECTED NOT DETECTED Final   Influenza A NOT DETECTED NOT DETECTED Final   Influenza B NOT DETECTED NOT DETECTED Final   Parainfluenza Virus 1 NOT DETECTED NOT DETECTED Final   Parainfluenza Virus 2 NOT DETECTED NOT DETECTED Final   Parainfluenza Virus 3 NOT DETECTED NOT DETECTED Final   Parainfluenza Virus 4 NOT DETECTED NOT DETECTED Final   Respiratory Syncytial Virus NOT DETECTED NOT DETECTED Final   Bordetella pertussis NOT DETECTED NOT DETECTED Final   Chlamydophila pneumoniae NOT DETECTED NOT DETECTED Final   Mycoplasma pneumoniae NOT DETECTED NOT DETECTED Final    Comment: Performed at [facility], [place] [address], [place], [place] [address]  Urine culture     Status: None   Collection Time: 09/30/17  1:00 PM  Result Value Ref Range Status   Specimen Description URINE, CLEAN CATCH  Final   Special Requests NONE  Final   Culture   Final    NO GROWTH Performed at [facility], [place] [address], [place], [place] [address]    Report Status 10/01/2017 FINAL  Final  MRSA PCR Screening     Status: None   Collection Time: 09/30/17  9:11 PM  Result Value Ref Range Status   MRSA by PCR NEGATIVE NEGATIVE Final    Comment:        The GeneXpert MRSA Assay (FDA approved for NASAL specimens only), is one component of a comprehensive MRSA colonization surveillance program. It is not intended to diagnose MRSA infection nor to guide or monitor treatment for MRSA infections. Performed at [facility], [place] [address], [place], [place] [address]          Radiology Studies: Dg Esophagus  Result Date: 10/07/2017 CLINICAL DATA:  Dysphagia EXAM: ESOPHOGRAM/BARIUM SWALLOW TECHNIQUE: Single contrast examination  was performed using  thin barium. FLUOROSCOPY TIME:  Fluoroscopy Time:  1 minute 6 seconds Radiation Exposure Index (if provided by the fluoroscopic device): 20.7 mGy Number of Acquired Spot Images: 0 COMPARISON:  10/05/2017 FINDINGS: The examination was performed in the semi upright position. There was normal pharyngeal anatomy and motility. Moderate amount of secretions are noted in the cervical esophagus. Contrast flowed freely through the esophagus without evidence of stricture or mass. No evidence of irregularity or ulceration. Tertiary contractions of the mid and distal thoracic esophagus throughout the examination most concerning for esophageal spasm which delayed transit of contrast. No evidence of reflux. No definite hiatal hernia was demonstrated. IMPRESSION: 1. Tertiary contractions of the mid and distal thoracic esophagus throughout the examination most concerning for esophageal spasm which delayed transit of contrast. 2. No esophageal foreign body or retained food. Moderate amount of secretions are noted in the cervical esophagus. These results were called by telephone at the time of interpretation on 10/07/2017 at 2:59 pm to Dr. Otis Brace , who verbally acknowledged these results. Electronically Signed   By: Kathreen Devoid   On: 10/07/2017 14:59   Dg Chest Port 1  View  Result Date: 10/09/2017 CLINICAL DATA:  Possible aspiration. History of diabetes, hypertension and coronary artery disease. EXAM: PORTABLE CHEST 1 VIEW COMPARISON:  10/08/2017. FINDINGS: Previous median sternotomy and CABG procedure. The heart size is enlarged. Aortic atherosclerosis noted. Pulmonary vascular congestion is noted. No airspace consolidation. IMPRESSION: 1. Cardiac enlargement and pulmonary vascular congestion. 2.  Aortic Atherosclerosis (ICD10-I70.0). Electronically Signed   By: Kerby Moors M.D.   On: 10/09/2017 07:55   Dg Chest Port 1 View  Result Date: 10/08/2017 CLINICAL DATA:  Cough and congestion EXAM:  PORTABLE CHEST 1 VIEW COMPARISON:  Chest radiograph 10/04/2017 FINDINGS: Unchanged cardiomegaly. Remote median sternotomy for CABG. No focal airspace consolidation or pulmonary edema. IMPRESSION: Cardiomegaly without focal airspace disease. Electronically Signed   By: Ulyses Jarred M.D.   On: 10/08/2017 22:30        Scheduled Meds: . ARIPiprazole  5 mg Oral Daily  . aspirin  325 mg Oral Daily  . atorvastatin  40 mg Oral q1800  . carvedilol  3.125 mg Oral BID WC  . donepezil  10 mg Oral Daily  . FLUoxetine  40 mg Oral Daily  . furosemide  80 mg Oral BID  . heparin injection (subcutaneous)  5,000 Units Subcutaneous Q8H  . insulin aspart  0-15 Units Subcutaneous TID WC  . insulin aspart  0-5 Units Subcutaneous QHS  . lamoTRIgine  100 mg Oral BID  . losartan  25 mg Oral Daily  . mouth rinse  15 mL Mouth Rinse BID  . potassium citrate  10 mEq Oral BID  . sodium chloride flush  3 mL Intravenous Q12H  . spironolactone  25 mg Oral Daily  . traZODone  100 mg Oral QHS   Continuous Infusions: . sodium chloride       LOS: 9 days    Time spent: 40 minutes    WOODS, Geraldo Docker, MD [facility] Pager [phone]   If 7PM-7AM, please contact night-coverage [email or web address] Password [facility] 10/09/2017, 8:24 AM

## 2017-10-09 NOTE — Progress Notes (Signed)
  Speech Language Pathology Treatment: Dysphagia  Patient Details Name: [name] MRN: [id number] DOB: 07-19-1946 Today's Date: 10/09/2017 Time: 7322-0254 SLP Time Calculation (min) (ACUTE ONLY): 20 min  Assessment / Plan / Recommendation Clinical Impression  MBS unable to be scheduled for this afternoon, so study will be deferred until am.  Wife, daughter at bedside.  Pt coughing consistently after consuming the iced tea his wife was offering him.  Provided nectar-thick liquids to determine improved tolerance.  Symptoms decreased as a result.  Pt maintains cup/yankauer to remove secretions - his wife asserts that he has carried a cup with him for years due to constant regurgitation.  Will plan for MBS next date - phoned RN, [name] to discuss plan.  If pt continues to cough with thickened liquids, make NPO until swallow study next date. RN in agreement.     HPI HPI: 72 yo WM PMHxseizures, depression,CVA; OSA not on CPAP; seizures; HTN; HLD; Dementia; ETOH abuse; DM(diet-controlled); CAD s/p CABG 2706; chronic diastolic CHF 2376, atrial flutter, AKI, Diverticulitis s/p partial colectomy, hiatal hernia, PE 2005, nephrolithiasis 04/15/2014. SLP evaluated 11/15/13 with findings of functional oropharygneal swallow without evidence of aspiration. Regular diet with thin liquids was recommended. Noted history of esophageal dysphagia, s/p balloon dilation 10/06/13.  Bedside swallow eval 10/04/17 revealed concerns for primary esophageal dysphagia.  2/09: DGEsophagus:Large retained fluid bolus and possible food impaction noted. This food bolus moved from the upper thoracic esophagus to the lower esophagus during the study. There is poor distention and expansion of the distal esophagus.-Cannot exclude distal esophageal stricture. Severe dysmotility. Repeat esophagram 2/12: no evidence of food bolus orfood impaction. It showed significant tertiary contraction. No evidence of stricture or mass. GI recommended  advancing diet; pt started clear liquids; then noted to be coughing, developed rales/rhonchi and repeat SLP evaluation was ordered.        SLP Plan  New goals to be determined pending instrumental study       Recommendations  Diet recommendations: Nectar-thick liquid Liquids provided via: Cup Medication Administration: Whole meds with liquid Supervision: Patient able to self feed Postural Changes and/or Swallow Maneuvers: Seated upright 90 degrees                Oral Care Recommendations: Oral care QID SLP Visit Diagnosis: Dysphagia, unspecified (R13.10) Plan: New goals to be determined pending instrumental study       GO                [name] 10/09/2017, 2:37 PM

## 2017-10-09 NOTE — Evaluation (Signed)
Occupational Therapy Evaluation Patient Details Name: [name] MRN: [id number] DOB: 06-22-1946 Today's Date: 10/09/2017    History of Present Illness 72 y.o. male admitted on 09/30/17 for weakness, dx with sepsis and NSTEMI, and acute systolic heart failure.  Pt with significant PMH of stroke, seizures, PE, HTN, DM, CAD, back pain, A-flutter, AKI, cervical fusion and back surgery.    Clinical Impression   Pt reports he required assist with ADL PTA. Currently pt requires mod assist +2 for short distance mobility and mod-max assist overall for ADL. Pt presenting with generalized weakness, deconditioning, slow processing and requires cues for initiation and sequencing of tasks. Recommending CIR level therapies to maximize independence and safety with ADL and functional mobility prior to return home. Pt would benefit from continued skilled OT to address established goals.    Follow Up Recommendations  CIR;Supervision/Assistance - 24 hour    Equipment Recommendations  Other (comment)(TBD at next venue)    Recommendations for Other Services       Precautions / Restrictions Precautions Precautions: Fall Restrictions Weight Bearing Restrictions: No      Mobility Bed Mobility Overal bed mobility: Needs Assistance Bed Mobility: Rolling;Sidelying to Sit Rolling: Mod assist Sidelying to sit: Mod assist;+2 for physical assistance       General bed mobility comments: Cues for sequencing and technique. HOB flat. Assist for LEs and trunk elevation. Assist to scoot hips out to EOB  Transfers Overall transfer level: Needs assistance Equipment used: Rolling walker (2 wheeled) Transfers: Sit to/from Stand Sit to Stand: Mod assist;+2 physical assistance         General transfer comment: Mod assist +2 to boost up from EOB. Cues for hand placement with RW    Balance Overall balance assessment: Needs assistance Sitting-balance support: Feet supported Sitting balance-Leahy Scale:  Fair     Standing balance support: Bilateral upper extremity supported Standing balance-Leahy Scale: Poor                             ADL either performed or assessed with clinical judgement   ADL Overall ADL's : Needs assistance/impaired Eating/Feeding: Minimal assistance;Sitting   Grooming: Minimal assistance;Sitting   Upper Body Bathing: Moderate assistance;Sitting   Lower Body Bathing: Maximal assistance;Sit to/from stand;+2 for physical assistance   Upper Body Dressing : Moderate assistance;Sitting   Lower Body Dressing: Maximal assistance;+2 for physical assistance;Sit to/from stand   Toilet Transfer: Moderate assistance;+2 for physical assistance;Ambulation;RW Toilet Transfer Details (indicate cue type and reason): Simulated with transfer  Toileting- Clothing Manipulation and Hygiene: Total assistance;+2 for physical assistance;Bed level Toileting - Clothing Manipulation Details (indicate cue type and reason): for peri care following incontinent BM     Functional mobility during ADLs: Moderate assistance;+2 for physical assistance;Rolling walker General ADL Comments: VSS thorughout on 3L supplemental O2     Vision         Perception     Praxis      Pertinent Vitals/Pain Pain Assessment: Faces Faces Pain Scale: Hurts little more Pain Location: buttocks Pain Descriptors / Indicators: Discomfort;Sore Pain Intervention(s): Monitored during session;Repositioned     Hand Dominance Left   Extremity/Trunk Assessment Upper Extremity Assessment Upper Extremity Assessment: Generalized weakness   Lower Extremity Assessment Lower Extremity Assessment: Defer to PT evaluation   Cervical / Trunk Assessment Cervical / Trunk Assessment: Kyphotic   Communication Communication Communication: HOH   Cognition Arousal/Alertness: Awake/alert Behavior During Therapy: Flat affect Overall Cognitive Status: No family/caregiver present  to determine baseline  cognitive functioning                                     General Comments       Exercises     Shoulder Instructions      Home Living Family/patient expects to be discharged to:: Private residence Living Arrangements: Spouse/significant other Available Help at Discharge: Family;Available 24 hours/day;Personal care attendant Type of Home: House Home Access: Ramped entrance     Home Layout: One level     Bathroom Shower/Tub: Producer, television/film/video: Handicapped height     Home Equipment: Shower seat;Grab bars - tub/shower;Hand held Careers information officer - 4 wheels;Walker - 2 wheels;Adaptive equipment;Other (comment) Adaptive Equipment: Reacher Additional Comments: has an aide 3 hours a day for showers and LB dressing (per previous admission note, pt did not report aide this admission, but he is also a poor historian).  Uses 3 L O2 [place] at home all the time.       Prior Functioning/Environment Level of Independence: Needs assistance  Gait / Transfers Assistance Needed: per pt report uses his rollator in the house, he does not drive ADL's / Homemaking Assistance Needed: aide assisting with ADL            OT Problem List: Decreased strength;Decreased activity tolerance;Impaired balance (sitting and/or standing);Decreased cognition;Decreased safety awareness;Decreased knowledge of use of DME or AE;Decreased knowledge of precautions;Pain      OT Treatment/Interventions: Self-care/ADL training;Therapeutic exercise;Energy conservation;DME and/or AE instruction;Therapeutic activities;Patient/family education;Balance training    OT Goals(Current goals can be found in the care plan section) Acute Rehab OT Goals Patient Stated Goal: none stated OT Goal Formulation: With patient Time For Goal Achievement: 10/23/17 Potential to Achieve Goals: Good ADL Goals Pt Will Perform Grooming: with supervision;sitting Pt Will Perform Upper Body Bathing: with  supervision;sitting Pt Will Perform Lower Body Bathing: with min assist;sit to/from stand Pt Will Transfer to Toilet: with min assist;bedside commode;ambulating Pt Will Perform Toileting - Clothing Manipulation and hygiene: with min assist;sit to/from stand  OT Frequency: Min 2X/week   Barriers to D/C:            Co-evaluation PT/OT/SLP Co-Evaluation/Treatment: Yes Reason for Co-Treatment: Complexity of the patient's impairments (multi-system involvement);Necessary to address cognition/behavior during functional activity;For patient/therapist safety;To address functional/ADL transfers   OT goals addressed during session: ADL's and self-care      AM-PAC PT "6 Clicks" Daily Activity     Outcome Measure Help from another person eating meals?: A Little Help from another person taking care of personal grooming?: A Little Help from another person toileting, which includes using toliet, bedpan, or urinal?: A Lot Help from another person bathing (including washing, rinsing, drying)?: A Lot Help from another person to put on and taking off regular upper body clothing?: A Lot Help from another person to put on and taking off regular lower body clothing?: A Lot 6 Click Score: 14   End of Session Equipment Utilized During Treatment: Gait belt;Rolling walker;Oxygen  Activity Tolerance: Patient tolerated treatment well Patient left: in chair;with call bell/phone within reach;with chair alarm set  OT Visit Diagnosis: Unsteadiness on feet (R26.81);Other abnormalities of gait and mobility (R26.89);Muscle weakness (generalized) (M62.81);Pain Pain - part of body: (buttocks)                Time: 4696-2952 OT Time Calculation (min): 26 min Charges:  OT General Charges $OT  Visit: 1 Visit OT Evaluation $OT Eval Moderate Complexity: 1 Mod G-Codes:     Chaley Castellanos A. Brett Albino, M.S., OTR/L Pager: [phone]  Gaye Alken 10/09/2017, 4:29 PM

## 2017-10-09 NOTE — Progress Notes (Signed)
Progress Note  Patient Name: [name] Date of Encounter: 10/09/2017  Primary Cardiologist: Peter Martinique, MD   Subjective   Events noted; increased respiratory distress last PM; mild dyspnea this AM; no chest pain  Inpatient Medications    Scheduled Meds: . ARIPiprazole  5 mg Oral Daily  . aspirin  325 mg Oral Daily  . atorvastatin  40 mg Oral q1800  . carvedilol  3.125 mg Oral BID WC  . donepezil  10 mg Oral Daily  . FLUoxetine  40 mg Oral Daily  . furosemide  40 mg Oral BID  . heparin injection (subcutaneous)  5,000 Units Subcutaneous Q8H  . insulin aspart  0-15 Units Subcutaneous TID WC  . insulin aspart  0-5 Units Subcutaneous QHS  . lamoTRIgine  100 mg Oral BID  . losartan  25 mg Oral Daily  . mouth rinse  15 mL Mouth Rinse BID  . potassium citrate  10 mEq Oral BID  . sodium chloride flush  3 mL Intravenous Q12H  . spironolactone  25 mg Oral Daily  . traZODone  100 mg Oral QHS   Continuous Infusions: . sodium chloride     PRN Meds: sodium chloride, acetaminophen, fluticasone, nitroGLYCERIN, ondansetron (ZOFRAN) IV, sodium chloride flush   Vital Signs    Vitals:   10/08/17 2352 10/09/17 0255 10/09/17 0439 10/09/17 0755  BP: 124/66  111/61 109/61  Pulse: 66  64 63  Resp: 15  18 13   Temp: 99.2 F (37.3 C)  98.8 F (37.1 C) 98.1 F (36.7 C)  TempSrc: Axillary  Axillary Axillary  SpO2: 96%  99% 94%  Weight:  216 lb 14.9 oz (98.4 kg)    Height:        Intake/Output Summary (Last 24 hours) at 10/09/2017 0759 Last data filed at 10/09/2017 0440 Gross per 24 hour  Intake 840 ml  Output 3852 ml  Net -3012 ml   Filed Weights   10/07/17 0314 10/08/17 0300 10/09/17 0255  Weight: 212 lb 15.4 oz (96.6 kg) 217 lb 13 oz (98.8 kg) 216 lb 14.9 oz (98.4 kg)    Telemetry    NSR- Personally Reviewed  Physical Exam   GEN: Obese; on CPAP Neck: supple Cardiac: RRR Respiratory: CTA GI: Soft, Not tender or distended MS: 2+ edema Neuro: moves all ext;  somnolent   Labs    Chemistry Recent Labs  Lab 10/04/17 0254  10/07/17 0305 10/08/17 0313 10/08/17 2301 10/09/17 0421  NA 133*   < > 140 137 131* 136  K 4.3   < > 4.0 3.8 3.7 3.8  CL 102   < > 104 101 96* 96*  CO2 18*   < > 26 26 22 27   GLUCOSE 126*   < > 95 90 101* 102*  BUN 32*   < > 20 15 11 11   CREATININE 1.10   < > 0.78 0.72 0.62 0.70  CALCIUM 7.9*   < > 7.8* 7.8* 7.6* 8.0*  PROT 5.9*  --  5.2* 5.4*  --   --   ALBUMIN 2.7*  --  2.3* 2.2*  --   --   AST 329*  --  209* 116*  --   --   ALT 403*  --  393* 290*  --   --   ALKPHOS 379*  --  301* 251*  --   --   BILITOT 1.1  --  0.7 0.8  --   --   GFRNONAA >60   < > >  60 >60 >60 >60  GFRAA >60   < > >60 >60 >60 >60  ANIONGAP 13   < > 10 10 13 13    < > = values in this interval not displayed.     Hematology Recent Labs  Lab 10/06/17 0335 10/07/17 0305 10/08/17 2301  WBC 13.1* 12.1* 10.8*  RBC 3.34* 3.50* 3.42*  HGB 9.6* 9.9* 9.8*  HCT 31.5* 32.6* 31.4*  MCV 94.3 93.1 91.8  MCH 28.7 28.3 28.7  MCHC 30.5 30.4 31.2  RDW 16.6* 16.5* 16.7*  PLT 120* 129* 177    Cardiac Enzymes Recent Labs  Lab 10/02/17 1848 10/05/17 0426  TROPONINI 5.04* 1.96*     Radiology    Dg Esophagus  Result Date: 10/07/2017 CLINICAL DATA:  Dysphagia EXAM: ESOPHOGRAM/BARIUM SWALLOW TECHNIQUE: Single contrast examination was performed using  thin barium. FLUOROSCOPY TIME:  Fluoroscopy Time:  1 minute 6 seconds Radiation Exposure Index (if provided by the fluoroscopic device): 20.7 mGy Number of Acquired Spot Images: 0 COMPARISON:  10/05/2017 FINDINGS: The examination was performed in the semi upright position. There was normal pharyngeal anatomy and motility. Moderate amount of secretions are noted in the cervical esophagus. Contrast flowed freely through the esophagus without evidence of stricture or mass. No evidence of irregularity or ulceration. Tertiary contractions of the mid and distal thoracic esophagus throughout the examination most  concerning for esophageal spasm which delayed transit of contrast. No evidence of reflux. No definite hiatal hernia was demonstrated. IMPRESSION: 1. Tertiary contractions of the mid and distal thoracic esophagus throughout the examination most concerning for esophageal spasm which delayed transit of contrast. 2. No esophageal foreign body or retained food. Moderate amount of secretions are noted in the cervical esophagus. These results were called by telephone at the time of interpretation on 10/07/2017 at 2:59 pm to Dr. Otis Brace , who verbally acknowledged these results. Electronically Signed   By: Kathreen Devoid   On: 10/07/2017 14:59   Dg Chest Port 1 View  Result Date: 10/09/2017 CLINICAL DATA:  Possible aspiration. History of diabetes, hypertension and coronary artery disease. EXAM: PORTABLE CHEST 1 VIEW COMPARISON:  10/08/2017. FINDINGS: Previous median sternotomy and CABG procedure. The heart size is enlarged. Aortic atherosclerosis noted. Pulmonary vascular congestion is noted. No airspace consolidation. IMPRESSION: 1. Cardiac enlargement and pulmonary vascular congestion. 2.  Aortic Atherosclerosis (ICD10-I70.0). Electronically Signed   By: Kerby Moors M.D.   On: 10/09/2017 07:55   Dg Chest Port 1 View  Result Date: 10/08/2017 CLINICAL DATA:  Cough and congestion EXAM: PORTABLE CHEST 1 VIEW COMPARISON:  Chest radiograph 10/04/2017 FINDINGS: Unchanged cardiomegaly. Remote median sternotomy for CABG. No focal airspace consolidation or pulmonary edema. IMPRESSION: Cardiomegaly without focal airspace disease. Electronically Signed   By: Ulyses Jarred M.D.   On: 10/08/2017 22:30    Patient Profile     72 y.o. 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  Patient ruled in  for a non-ST elevation myocardial infarction and echocardiogram shows newly reduced LV function with ejection fraction 25%.  Assessment & Plan    1 NSTEMI: Plan to continue aspirin, statin and beta-blocker. He remains weak and not ambulating. Needs rehab and physical therapy.  We can consider catheterization if he improves physically.  If he does not improve best option may be medical therapy.  2 Acute systolic  heart failure: Patient is volume overloaded on examination.  Will increase Lasix to 80 mg BID; continue spironolactone 25 mg daily.  Follow renal function closely.  Continue coreg and cozaar.  3 Sepsis-per IM.  4 H/O PE-will change heparin to apixaban when it is clear all procedures complete.  For questions or updates, please contact [place] Please consult [email or web address] for contact info under Cardiology/STEMI.      Signed, Kirk Ruths, MD  10/09/2017, 7:59 AM

## 2017-10-09 NOTE — NC FL2 (Signed)
[place] LEVEL OF CARE SCREENING TOOL     IDENTIFICATION  Patient Name: [name] Birthdate: 11-14-1945 Sex: male Admission Date (Current Location): 09/30/2017  [facility] and [place] Number:  Herbalist and Address:  [facility] [place], [place] [address], [place], [place] [address]      Provider Number: [id number]  Attending Physician Name and Address:  Allie Bossier, MD  Relative Name and Phone Number:       Current Level of Care: Hospital Recommended Level of Care: [place] Prior Approval Number:    Date Approved/Denied:   PASRR Number: [id number]  Discharge Plan: SNF    Current Diagnoses: Patient Active Problem List   Diagnosis Date Noted  . Sepsis ([place]) 09/30/2017  . Dementia 09/30/2017  . Chronic diarrhea   . Severe comorbid illness   . Pressure injury of skin 08/28/2017  . NSTEMI (non-ST elevated myocardial infarction) ([place]) 08/27/2017  . Acute CHF (congestive heart failure) ([place]) 08/27/2017  . Rectal bleeding 07/07/2017  . Anemia   . Generalized weakness 04/25/2017  . Cerebrovascular accident (CVA) due to embolism of right anterior cerebral artery ([place]) 09/19/2015  . History of CVA with residual deficit 09/19/2015  . HLD (hyperlipidemia) 07/12/2015  . Essential hypertension 07/12/2015  . Coronary artery disease involving coronary bypass graft of native heart with angina pectoris ([place]) 07/12/2015  . Intracranial carotid stenosis 07/12/2015  . Morbid obesity ([place]) 02/21/2015  . Dysarthria   . History of pulmonary embolism   . Acute right ACA stroke ([place]) 02/11/2015  . Lower extremity weakness 01/08/2015  . Status post placement of implantable loop recorder 08/01/2014  . Chronic respiratory failure, unspecified whether with hypoxia or hypercapnia ([place]) 05/19/2014  . Pulmonary embolism ([place]) 05/19/2014  . Hemiparesis and speech and language deficit as late effects of stroke ([place]) 04/19/2014  .  Ureteral colic 75/17/0017  . Left ureteral stone 04/15/2014  . Obstructive sleep apnea 12/07/2013  . Obesity, unspecified 12/07/2013  . Embolic stroke ([place]) 49/44/9675  . Cough 06/09/2013  . S/P lumbar spine operation 06/02/2013  . Nerve root pain 06/02/2013  . Pleural plaque consistent with asbestos exposure by CT 10/2012 02/17/2013  . Renal failure 02/13/2013  . Dyspnea 02/11/2013  . Dysphagia, pharyngoesophageal phase 05/12/2012  . Seizure disorder ([place]) 04/04/2012  . CAD (coronary artery disease) 06/13/2011  . Diabetes mellitus ([place]) 06/13/2011  . Depression   . Back pain   . Hearing loss   . Arrhythmia   . Diverticulitis   . H/O alcohol abuse   . Personal history of other diseases of circulatory system 05/29/2011    Orientation RESPIRATION BLADDER Height & Weight     Self, Situation, Place  O2(3L ); will need new CPAP at facility Incontinent, External catheter Weight: 216 lb 14.9 oz (98.4 kg) Height:  5' 7"  (170.2 cm)  BEHAVIORAL SYMPTOMS/MOOD NEUROLOGICAL BOWEL NUTRITION STATUS      Continent Diet(see DC summary)  AMBULATORY STATUS COMMUNICATION OF NEEDS Skin   Extensive Assist Verbally Normal                       Personal Care Assistance Level of Assistance  Bathing, Dressing Bathing Assistance: Maximum assistance   Dressing Assistance: Maximum assistance     Functional Limitations Info             SPECIAL CARE FACTORS FREQUENCY  PT (By licensed PT), OT (By licensed OT)     PT Frequency:  5/wk OT Frequency: 5/wk            Contractures      Additional Factors Info  Code Status, Allergies, Psychotropic, Insulin Sliding Scale Code Status Info: FULL Allergies Info: Cephalexin, Doxycycline, Keflex Cephalexin, Lac Bovis, Pentazocine Lactate, Talwin Pentazocine, Tape, Zanaflex Tizanidine Hcl Psychotropic Info: abilify, prozac Insulin Sliding Scale Info: 4/day       Current Medications (10/09/2017):  This is the current hospital active  medication list Current Facility-Administered Medications  Medication Dose Route Frequency Provider Last Rate Last Dose  . 0.9 %  sodium chloride infusion  250 mL Intravenous PRN Karmen Bongo, MD      . acetaminophen (TYLENOL) tablet 650 mg  650 mg Oral Q4H PRN Karmen Bongo, MD   650 mg at 10/02/17 0912  . ARIPiprazole (ABILIFY) tablet 5 mg  5 mg Oral Daily Karmen Bongo, MD   5 mg at 10/09/17 1036  . aspirin EC tablet 325 mg  325 mg Oral Daily Karmen Bongo, MD   325 mg at 10/09/17 1015  . atorvastatin (LIPITOR) tablet 40 mg  40 mg Oral q1800 Allie Bossier, MD      . carvedilol (COREG) tablet 3.125 mg  3.125 mg Oral BID WC Lelon Perla, MD   3.125 mg at 10/09/17 1015  . donepezil (ARICEPT) tablet 10 mg  10 mg Oral Daily Karmen Bongo, MD   10 mg at 10/09/17 1014  . FLUoxetine (PROZAC) capsule 40 mg  40 mg Oral Daily Karmen Bongo, MD   40 mg at 10/09/17 1014  . fluticasone (FLONASE) 50 MCG/ACT nasal spray 1 spray  1 spray Each Nare BID PRN Karmen Bongo, MD   1 spray at 09/30/17 2243  . furosemide (LASIX) tablet 80 mg  80 mg Oral BID Lelon Perla, MD      . heparin injection 5,000 Units  5,000 Units Subcutaneous Q8H Lelon Perla, MD   5,000 Units at 10/09/17 1310  . insulin aspart (novoLOG) injection 0-15 Units  0-15 Units Subcutaneous TID WC Karmen Bongo, MD   3 Units at 10/08/17 1122  . insulin aspart (novoLOG) injection 0-5 Units  0-5 Units Subcutaneous QHS Karmen Bongo, MD      . lamoTRIgine (LAMICTAL) tablet 100 mg  100 mg Oral BID Karmen Bongo, MD   100 mg at 10/09/17 1013  . losartan (COZAAR) tablet 25 mg  25 mg Oral Daily Lelon Perla, MD   25 mg at 10/09/17 1014  . MEDLINE mouth rinse  15 mL Mouth Rinse BID Karmen Bongo, MD   15 mL at 10/09/17 1014  . nitroGLYCERIN (NITROSTAT) SL tablet 0.4 mg  0.4 mg Sublingual Q5 min PRN Cherene Altes, MD      . ondansetron [facility]) injection 4 mg  4 mg Intravenous Q6H PRN Karmen Bongo, MD   4  mg at 10/06/17 1831  . potassium citrate (UROCIT-K) SR tablet 10 mEq  10 mEq Oral BID Karmen Bongo, MD   10 mEq at 10/09/17 1014  . sodium chloride flush (NS) 0.9 % injection 3 mL  3 mL Intravenous Q12H Karmen Bongo, MD   3 mL at 10/09/17 1014  . sodium chloride flush (NS) 0.9 % injection 3 mL  3 mL Intravenous PRN Karmen Bongo, MD      . spironolactone (ALDACTONE) tablet 25 mg  25 mg Oral Daily Lelon Perla, MD   25 mg at 10/09/17 1016  . traZODone (DESYREL) tablet 100 mg  100 mg  Oral Ivery Quale, MD   100 mg at 10/07/17 2105     Discharge Medications: Please see discharge summary for a list of discharge medications.  Relevant Imaging Results:  Relevant Lab Results:   Additional Information SS#: [id number]  Jorge Ny, LCSW

## 2017-10-09 NOTE — Progress Notes (Signed)
Puja. R.N. With [facility] call and ask to come see patient and assess Resp. Status. Resp even and unlab. Lots of Coughing and Rhonchi and rales bilat lungs. Skin warm and dry V.S.S.Puja R.N. Enter and release  orders under my name. See her notes

## 2017-10-09 NOTE — Progress Notes (Signed)
ANTICOAGULATION CONSULT NOTE - Initial Consult  Pharmacy Consult for Apixaban Indication: history of PE  Allergies  Allergen Reactions  . Cephalexin     Unknown  . Doxycycline     Unknown  . Keflex [Cephalexin]   . Lac Bovis Other (See Comments)    lactose intolerant  . Pentazocine Lactate     He passed out- he had a seizure.  This occurred around 2000  . Talwin [Pentazocine] Other (See Comments)    hallucinations  . Tape Other (See Comments)    Paper tape only please.  Freddi Starr [Tizanidine Hcl] Other (See Comments)    Lightheaded and dizzy    Patient Measurements: Height: 5' 7"  (170.2 cm) Weight: 216 lb 14.9 oz (98.4 kg) IBW/kg (Calculated) : 66.1  Vital Signs: Temp: 98.8 F (37.1 C) (02/14 2019) Temp Source: Oral (02/14 2019) BP: 108/63 (02/14 2019) Pulse Rate: 65 (02/14 1740)  Labs: Recent Labs    10/07/17 0305 10/08/17 0313 10/08/17 2301 10/09/17 0421  HGB 9.9*  --  9.8*  --   HCT 32.6*  --  31.4*  --   PLT 129*  --  177  --   LABPROT 16.6*  --   --   --   INR 1.36  --   --   --   HEPARINUNFRC 0.27*  --   --   --   CREATININE 0.78 0.72 0.62 0.70    Estimated Creatinine Clearance: 91.9 mL/min (by C-G formula based on SCr of 0.7 mg/dL).   Medical History: Past Medical History:  Diagnosis Date  . AKI (acute kidney injury) ([place])    a. 6/14: resolved after d/c ARB;  b. RA U/S 7/14: no RA stenosis  . Arthritis    of spine  . Atrial flutter ([place])    a. 2008 - one episode in setting of sepsis/shock.  . Back pain    persistent  . Coronary artery disease    a. s/p CABG 2005. b. low risk nuc 2016.  Marland Kitchen Depression   . Diabetes mellitus    controled by diet  . Diverticulitis    a. s/p partial colectomy remotely.  Marland Kitchen GERD (gastroesophageal reflux disease)   . H/O alcohol abuse   . Headache(784.0)   . Hearing loss   . History of hiatal hernia   . Hx of echocardiogram    Echo 7/14:  Mod LVH, EF 55-60%, Gr 1 DD, mild MR, PASP 36  . Hyperlipidemia   .  Hypertension   . Pulmonary embolism ([place]) 2005  . Renal stone 04/15/2014  . Seizures ([place])   . Sleep apnea    does not use CPAP  . Stroke [facility])     Medications:  Medications Prior to Admission  Medication Sig Dispense Refill Last Dose  . acetaminophen (TYLENOL) 325 MG tablet Take 650 mg by mouth every 6 (six) hours as needed for moderate pain.   Past Week at Unknown time  . ARIPiprazole (ABILIFY) 5 MG tablet Take 5 mg by mouth daily.    09/29/2017 at Unknown time  . Artificial Tear Solution (GENTEAL TEARS OP) Apply 1 drop to eye as needed (dry eyes).   09/29/2017 at Unknown time  . aspirin 325 MG EC tablet Take 325 mg daily by mouth.   09/29/2017 at Unknown time  . atorvastatin (LIPITOR) 80 MG tablet Take 1 tablet (80 mg total) by mouth daily. 30 tablet 11 09/29/2017 at Unknown time  . camphor-menthol (SARNA) lotion Apply 1 application topically daily.  To entire body   09/29/2017 at Unknown time  . donepezil (ARICEPT) 10 MG tablet Take 10 mg by mouth daily.    09/29/2017 at Unknown time  . Fluocinolone Acetonide 0.01 % OIL Apply 1 application topically daily as needed (itching). Apply to scalp   09/29/2017 at Unknown time  . FLUoxetine (PROZAC) 20 MG capsule Take 40 mg by mouth daily.   09/29/2017 at Unknown time  . fluticasone (FLONASE) 50 MCG/ACT nasal spray Place 1 spray into both nostrils 2 (two) times daily as needed for rhinitis.    09/29/2017  . furosemide (LASIX) 20 MG tablet TAKE 1-2 TABLETS BY MOUTH EVERY DAY AS NEEDED (Patient taking differently: 20 mg mg DAILY in the morning (4am)) 60 tablet 3 09/29/2017 at Unknown time  . glimepiride (AMARYL) 2 MG tablet Take 1 mg by mouth daily with breakfast. Patient takes one-half tablet (54m)  every day with breakfast   09/29/2017 at Unknown time  . lamoTRIgine (LAMICTAL) 200 MG tablet Take 100 mg by mouth 2 (two) times daily.   09/29/2017 at 2100  . Multiple Vitamin (MULTIVITAMIN) capsule Take 1 capsule by mouth daily.   09/29/2017 at Unknown time  . nitroGLYCERIN  (NITROSTAT) 0.4 MG SL tablet Place 1 tablet (0.4 mg total) under the tongue every 5 (five) minutes as needed. For chest pain 25 tablet 1 unk at prn  . OXYGEN Inhale 2.5 L into the lungs.     . potassium citrate (UROCIT-K) 10 MEQ (1080 MG) SR tablet Take 10 mEq 2 (two) times daily by mouth.    09/29/2017 at Unknown time  . traZODone (DESYREL) 100 MG tablet Take 100 mg by mouth at bedtime.    09/29/2017 at Unknown time  . Triamcinolone Acetonide (TRIAMCINOLONE 0.1 % CREAM : EUCERIN) CREA Apply 1 application topically daily. TRIAMCINOLONE 1%/ Absorbase   09/29/2017 at Unknown time  . ONE TOUCH ULTRA TEST test strip USE TO TEST BLOOD SUGAR DAILY 100 each 0 Taking    Assessment: 72yo M with a hx of PE.  Pt was on Eliquis in the past but this was discontinued Nov 2018 due to GI bleed / rectal bleed.  Pt evaluated for ACS with plans for ischemic eval when SOB improves.  For now, cards recommends restarting Eliquis.  Goal of Therapy:  Therapeutic Anticoagulation Monitor platelets by anticoagulation protocol: Yes   Plan:  D/C SQ hep Eliquis 566mPO BID Consider reducing ASA to 81 or discontinuing altogether with start of Eliquis.  Ki[employer]power IncPharm.D., BCPS Clinical Pharmacist 10/09/2017 8:30 PM

## 2017-10-09 NOTE — Progress Notes (Signed)
Patient has made progress with therapy as noted. I will order an inpt rehab consult to assess for admission. Noted wife does not [facility] SN[facility] or [facility]. Would like pt to receive his rehab close to acute MDs for medical management follow up . [phone]

## 2017-10-09 NOTE — Progress Notes (Signed)
Physical Therapy Treatment Patient Details Name: [name] MRN: [id number] DOB: [birth year] Today's Date: 10/09/2017    History of Present Illness 72 y.o. male admitted on 09/30/17 for weakness, dx with sepsis and NSTEMI, and acute systolic heart failure.  Pt with significant PMH of stroke, seizures, PE, HTN, DM, CAD, back pain, A-flutter, AKI, cervical fusion and back surgery.     PT Comments    Pt progressing to standing and short trial of gait, patient required close chair follow and fatigues quickly with mobility.  Plan to progress gait training next session.  Plan for short term rehab at [facility] remains appropriate.      Follow Up Recommendations  CIR     Equipment Recommendations  Hospital bed    Recommendations for Other Services Rehab consult;OT consult     Precautions / Restrictions Precautions Precautions: Fall Restrictions Weight Bearing Restrictions: No    Mobility  Bed Mobility Overal bed mobility: Needs Assistance Bed Mobility: Rolling;Sidelying to Sit Rolling: Mod assist Sidelying to sit: Mod assist;+2 for physical assistance       General bed mobility comments: Cues for sequencing and technique. HOB flat. Assist for LEs and trunk elevation. Assist to scoot hips out to EOB  Transfers Overall transfer level: Needs assistance Equipment used: Rolling walker (2 wheeled) Transfers: Sit to/from Stand Sit to Stand: Mod assist;+2 physical assistance         General transfer comment: Mod assist +2 to boost up from EOB. Cues for hand placement with RW  Ambulation/Gait Ambulation/Gait assistance: Mod assist;+2 physical assistance;+2 safety/equipment(+3 for chair follow.  ) Ambulation Distance (Feet): 6 Feet Assistive device: Rolling walker (2 wheeled) Gait Pattern/deviations: Decreased stride length;Step-to pattern;Shuffle;Trunk flexed;Narrow base of support   Gait velocity interpretation: Below normal speed for age/gender General Gait Details: Pt performed  slow shuffling pattern with forward head and flexed trunk. Cues provided for upper trunk and head control and to increased stride length.  Assist provided to facilitate weight shifting   Stairs            Wheelchair Mobility    Modified Rankin (Stroke Patients Only)       Balance Overall balance assessment: Needs assistance Sitting-balance support: Feet supported Sitting balance-Leahy Scale: Fair     Standing balance support: Bilateral upper extremity supported Standing balance-Leahy Scale: Poor                              Cognition Arousal/Alertness: Awake/alert Behavior During Therapy: Flat affect Overall Cognitive Status: No family/caregiver present to determine baseline cognitive functioning                                 General Comments: Per chart he has memory deficits at baseline and he is also very HOH making it difficult to tell if he just can't hear what is being said vs true cognitive deficits.  He does seem to be slow to process.       Exercises      General Comments        Pertinent Vitals/Pain Pain Assessment: Faces Faces Pain Scale: Hurts little more Pain Location: buttocks Pain Descriptors / Indicators: Discomfort;Sore Pain Intervention(s): Monitored during session;Repositioned    Home Living Family/patient expects to be discharged to:: Private residence Living Arrangements: Spouse/significant other Available Help at Discharge: Family;Available 24 hours/day;Personal care attendant Type of Home: House Home Access: Ramped entrance  Home Layout: One level Home Equipment: Shower seat;Grab bars - tub/shower;Hand held shower head;Walker - 4 wheels;Walker - 2 wheels;Adaptive equipment;Other (comment) Additional Comments: has an aide 3 hours a day for showers and LB dressing (per previous admission note, pt did not report aide this admission, but he is also a poor historian).  Uses 3 L O2 [place] at home all the time.      Prior Function Level of Independence: Needs assistance  Gait / Transfers Assistance Needed: per pt report uses his rollator in the house, he does not drive ADL's / Homemaking Assistance Needed: aide assisting with ADL     PT Goals (current goals can now be found in the care plan section) Acute Rehab PT Goals Patient Stated Goal: none stated Potential to Achieve Goals: Fair Progress towards PT goals: Progressing toward goals    Frequency    Min 3X/week      PT Plan Current plan remains appropriate    Co-evaluation PT/OT/SLP Co-Evaluation/Treatment: Yes Reason for Co-Treatment: Complexity of the patient's impairments (multi-system involvement);Necessary to address cognition/behavior during functional activity;For patient/therapist safety PT goals addressed during session: Mobility/safety with mobility;Balance OT goals addressed during session: ADL's and self-care      AM-PAC PT "6 Clicks" Daily Activity  Outcome Measure  Difficulty turning over in bed (including adjusting bedclothes, sheets and blankets)?: Unable Difficulty moving from lying on back to sitting on the side of the bed? : Unable Difficulty sitting down on and standing up from a chair with arms (e.g., wheelchair, bedside commode, etc,.)?: Unable Help needed moving to and from a bed to chair (including a wheelchair)?: A Lot Help needed walking in hospital room?: A Lot Help needed climbing 3-5 steps with a railing? : Total 6 Click Score: 8    End of Session Equipment Utilized During Treatment: Oxygen(3L) Activity Tolerance: Patient limited by fatigue;Patient limited by pain;Patient limited by lethargy     PT Visit Diagnosis: Muscle weakness (generalized) (M62.81);Difficulty in walking, not elsewhere classified (R26.2)     Time: 3154-0086 PT Time Calculation (min) (ACUTE ONLY): 23 min  Charges:  $Therapeutic Activity: 8-22 mins                    G Codes:       Governor Rooks, PTA pager  [phone]    Cristela Blue 10/09/2017, 5:10 PM

## 2017-10-09 NOTE — Progress Notes (Signed)
Call Resp. Tx. To come assess patient resp. Status, Bilat lungs with Rhonchi and rales. Resp even and unlab. O2 sats good on 3 liters Skin warm and dry. Charge R.N. Aware.

## 2017-10-10 ENCOUNTER — Inpatient Hospital Stay (HOSPITAL_COMMUNITY): Payer: Medicare Other

## 2017-10-10 DIAGNOSIS — R5381 Other malaise: Secondary | ICD-10-CM

## 2017-10-10 DIAGNOSIS — I25810 Atherosclerosis of coronary artery bypass graft(s) without angina pectoris: Secondary | ICD-10-CM

## 2017-10-10 DIAGNOSIS — I519 Heart disease, unspecified: Secondary | ICD-10-CM

## 2017-10-10 LAB — BASIC METABOLIC PANEL
ANION GAP: 13 (ref 5–15)
BUN: 7 mg/dL (ref 6–20)
CO2: 28 mmol/L (ref 22–32)
Calcium: 8.1 mg/dL — ABNORMAL LOW (ref 8.9–10.3)
Chloride: 98 mmol/L — ABNORMAL LOW (ref 101–111)
Creatinine, Ser: 0.69 mg/dL (ref 0.61–1.24)
GFR calc Af Amer: >60 mL/min (ref >60)
Glucose, Bld: 108 mg/dL — ABNORMAL HIGH (ref 65–99)
POTASSIUM: 3.5 mmol/L (ref 3.5–5.1)
SODIUM: 139 mmol/L (ref 135–145)

## 2017-10-10 LAB — GLUCOSE, CAPILLARY
GLUCOSE-CAPILLARY: 107 mg/dL — AB (ref 65–99)
GLUCOSE-CAPILLARY: 141 mg/dL — AB (ref 65–99)
Glucose-Capillary: 154 mg/dL — ABNORMAL HIGH (ref 65–99)
Glucose-Capillary: 102 mg/dL — ABNORMAL HIGH (ref 65–99)

## 2017-10-10 MED ORDER — ASPIRIN EC 81 MG PO TBEC
81.0000 mg | DELAYED_RELEASE_TABLET | Freq: Every day | ORAL | Status: DC
  Administered 2017-10-11: 81 mg via ORAL
  Filled 2017-10-10: qty 1

## 2017-10-10 MED ORDER — INSULIN ASPART 100 UNIT/ML ~~LOC~~ SOLN: 0.0000 [IU] | Freq: Three times a day (TID) | SUBCUTANEOUS | 11 refills | Status: AC

## 2017-10-10 MED ORDER — FLUOXETINE HCL 40 MG PO CAPS: 40.0000 mg | ORAL_CAPSULE | Freq: Every day | ORAL | 3 refills | Status: AC

## 2017-10-10 MED ORDER — FLUTICASONE PROPIONATE 50 MCG/ACT NA SUSP: 1.0000 | Freq: Two times a day (BID) | NASAL | 2 refills | Status: AC | PRN

## 2017-10-10 MED ORDER — ARIPIPRAZOLE 5 MG PO TABS: 5.0000 mg | ORAL_TABLET | Freq: Every day | ORAL | Status: DC

## 2017-10-10 MED ORDER — LAMOTRIGINE 100 MG PO TABS: 100.0000 mg | ORAL_TABLET | Freq: Two times a day (BID) | ORAL | Status: AC

## 2017-10-10 MED ORDER — LOSARTAN POTASSIUM 25 MG PO TABS: 25.0000 mg | ORAL_TABLET | Freq: Every day | ORAL | Status: DC

## 2017-10-10 MED ORDER — POTASSIUM CITRATE ER 10 MEQ (1080 MG) PO TBCR: 10.0000 meq | EXTENDED_RELEASE_TABLET | Freq: Two times a day (BID) | ORAL | Status: DC

## 2017-10-10 MED ORDER — ACETAMINOPHEN 10 MG/ML IV SOLN: 1000.0000 mg | Freq: Four times a day (QID) | INTRAVENOUS | Status: DC | PRN

## 2017-10-10 MED ORDER — NITROGLYCERIN 0.4 MG SL SUBL: 0.4000 mg | SUBLINGUAL_TABLET | SUBLINGUAL | 12 refills | Status: DC | PRN

## 2017-10-10 MED ORDER — FUROSEMIDE 80 MG PO TABS: 80.0000 mg | ORAL_TABLET | Freq: Two times a day (BID) | ORAL | Status: DC

## 2017-10-10 MED ORDER — ASPIRIN 81 MG PO TBEC: 81.0000 mg | DELAYED_RELEASE_TABLET | Freq: Every day | ORAL | Status: DC

## 2017-10-10 MED ORDER — TRAZODONE HCL 100 MG PO TABS: 100.0000 mg | ORAL_TABLET | Freq: Every day | ORAL | Status: AC

## 2017-10-10 MED ORDER — DONEPEZIL HCL 10 MG PO TABS: 10.0000 mg | ORAL_TABLET | Freq: Every day | ORAL | Status: AC

## 2017-10-10 MED ORDER — INSULIN ASPART 100 UNIT/ML ~~LOC~~ SOLN: 0.0000 [IU] | Freq: Every day | SUBCUTANEOUS | 11 refills | Status: DC

## 2017-10-10 MED ORDER — RESOURCE THICKENUP CLEAR PO POWD
ORAL | Status: DC | PRN
  Filled 2017-10-10: qty 125

## 2017-10-10 MED ORDER — ACETAMINOPHEN 325 MG PO TABS: 650.0000 mg | ORAL_TABLET | ORAL | Status: AC | PRN

## 2017-10-10 MED ORDER — SPIRONOLACTONE 25 MG PO TABS: 25.0000 mg | ORAL_TABLET | Freq: Every day | ORAL | Status: AC

## 2017-10-10 MED ORDER — CARVEDILOL 3.125 MG PO TABS: 3.1250 mg | ORAL_TABLET | Freq: Two times a day (BID) | ORAL | Status: DC

## 2017-10-10 MED ORDER — APIXABAN 5 MG PO TABS: 5.0000 mg | ORAL_TABLET | Freq: Two times a day (BID) | ORAL | Status: AC

## 2017-10-10 MED ORDER — ATORVASTATIN CALCIUM 40 MG PO TABS: 40.0000 mg | ORAL_TABLET | Freq: Every day | ORAL | Status: DC

## 2017-10-10 MED ORDER — ACETAMINOPHEN 325 MG PO TABS: 650.0000 mg | ORAL_TABLET | ORAL | Status: DC | PRN

## 2017-10-10 NOTE — Progress Notes (Signed)
Pt is not a candidate for CIR per Dr. Naaman Plummer in consult. SNF is recommended. I have alerted RN CM and SW. We will sign off at this time. [phone]

## 2017-10-10 NOTE — Progress Notes (Signed)
[facility] TEAM 1 - Stepdown/ICU TEAM  [name]  VQQ:[id number] DOB: August 20, 1946 DOA: 09/30/2017 PCP: Bernell List, MD    Brief Narrative:  72 yo M w/ a Hx of Seizures, depression, CVA, OSA not on CPAP, HTN, HLD, Dementia, ETOH abuse, DM (diet-controlled), CAD s/p CABG 4332, chronic diastolic CHF, atrial flutter, diverticulitis s/p partial colectomy, hiatal hernia, PE 2005, nephrolithiasis, and C diff colitis requiring hospital admit 1/1 > 09/01/2017 who presented with weakness.  Workup in the ED suggested SIRS v/s sepsis, with no clear source.  Flu negative. CXR and CTa chest  negative. Urine unremarkable.   Significant Events: 2/5 admit   Subjective: The patient is sitting up in a bedside chair having just taken 6 or 7 steps with physical therapy.  He currently denies chest pain nausea or vomiting but does report that he is short of breath.  He does not appear to be in extremis.  There is no family present at the time of my visit.  Assessment & Plan:  SIRS v/w Sepsis due to unknown source Has completed a seven-day course antibiotics - no clear infectious source - stopped abx 2/11 - suspect this represented SIRS in the setting of aspiration pneumonitis   Esophageal stricture / Esophageal dysmotility Initial barium swallow positive for impacted food/possible esophageal stricture - pt states has had multiple esophageal dilations in the past and was told he was not a candidate for further dilations due to a high risk of perforation - GI has evaluated - f/u barium esophagram confirmed clearance of the food impaction, and no evidence of a stricture - MBS today further supports severe esophageal dysmotility - cont diet as per SLP recs - appears to be stabilizing at this time   NSTEMI Initial EKG with ST depressions concerning for ischemia - Troponin significantly elevated but trending down - Cardiology to consider cardiac catheterization after pt undergoes rehab - Cards suggests we cont ASA  at 56m, statin, and BB   Acute on chronic systolic CHF TTE 2/6 noted EF 20-25% compared to 08/29/2017 EF 50-55% - lasix being dosed per Cards - cont ARB and BB and aldactone - f/u TTE suggested after d/c from rehab to help quide extent of further coronary evaluation   Mild transaminitis Unclear etiology - ?mild shock liver - slowly improving - acute hepatitis panel negative   HTN  BP controlled at this time   DM2 2/5 A1c 7.1 - CBG well controlled   HLD  Resumed lipitor  Dementia  continue Aricept, Abilify, Lamictal, and Trazodone - appears clinically to be mild/mod  OSA Not on home CPAP but clinically needs - arrange for DME CPAP at SNF   DVT prophylaxis: SQ heparin  Code Status: FULL CODE Family Communication: no family present    Disposition Plan: transfer to tele bed - anticipate d/c to SNF   Consultants:  Cardiology  [facility]  Antimicrobials:  Aztreonam 2/5 > 2/11 Levaquin 2/5 > 2/11 Vanc 2/5 > 2/10  Objective: Blood pressure 111/68, pulse 66, temperature 97.9 F (36.6 C), temperature source Oral, resp. rate 19, height 5' 7"  (1.702 m), weight 93.6 kg (206 lb 5.6 oz), SpO2 97 %.  Intake/Output Summary (Last 24 hours) at 10/10/2017 1445 Last data filed at 10/10/2017 1200 Gross per 24 hour  Intake 380 ml  Output 2400 ml  Net -2020 ml   Filed Weights   10/08/17 0300 10/09/17 0255 10/10/17 0304  Weight: 98.8 kg (217 lb 13 oz) 98.4 kg (216 lb 14.9 oz)  93.6 kg (206 lb 5.6 oz)    Examination: General: No acute respiratory distress - alert  Lungs: CTA th/o - no wheezing  Cardiovascular: distant HS - RRR w/o M   Abdomen: NT/ND, obese, soft, BS+, no rebound  Extremities: 1+ B LE edema   CBC: Recent Labs  Lab 10/04/17 0254 10/05/17 0426 10/06/17 0335 10/07/17 0305 10/08/17 2301  WBC 13.9* 16.5* 13.1* 12.1* 10.8*  NEUTROABS  --   --   --   --  7.6  HGB 10.5* 9.7* 9.6* 9.9* 9.8*  HCT 33.9* 31.7* 31.5* 32.6* 31.4*  MCV 92.4 93.2 94.3 93.1 91.8  PLT 132*  124* 120* 129* 094   Basic Metabolic Panel: Recent Labs  Lab 10/04/17 0254 10/05/17 0426 10/06/17 0335 10/07/17 0305 10/08/17 0313 10/08/17 2301 10/09/17 0421 10/10/17 0410  NA 133* 135 136 140 137 131* 136 139  K 4.3 4.4 4.2 4.0 3.8 3.7 3.8 3.5  CL 102 102 102 104 101 96* 96* 98*  CO2 18* 23 23 26 26 22 27 28   GLUCOSE 126* 86 93 95 90 101* 102* 108*  BUN 32* 27* 24* 20 15 11 11 7   CREATININE 1.10 0.92 0.91 0.78 0.72 0.62 0.70 0.69  CALCIUM 7.9* 7.7* 7.7* 7.8* 7.8* 7.6* 8.0* 8.1*  MG 2.3 2.4 2.4  --   --   --   --   --    GFR: Estimated Creatinine Clearance: 89.7 mL/min (by C-G formula based on SCr of 0.69 mg/dL).  Liver Function Tests: Recent Labs  Lab 10/04/17 0254 10/07/17 0305 10/08/17 0313  AST 329* 209* 116*  ALT 403* 393* 290*  ALKPHOS 379* 301* 251*  BILITOT 1.1 0.7 0.8  PROT 5.9* 5.2* 5.4*  ALBUMIN 2.7* 2.3* 2.2*    Coagulation Profile: Recent Labs  Lab 10/07/17 0305  INR 1.36    Cardiac Enzymes: Recent Labs  Lab 10/05/17 0426  TROPONINI 1.96*    HbA1C: Hgb A1c MFr Bld  Date/Time Value Ref Range Status  09/30/2017 09:25 PM 7.1 (H) 4.8 - 5.6 % Final    Comment:    (NOTE) Pre diabetes:          5.7%-6.4% Diabetes:              >6.4% Glycemic control for   <7.0% adults with diabetes   04/24/2017 08:20 PM 7.5 (H) 4.8 - 5.6 % Final    Comment:    (NOTE) Pre diabetes:          5.7%-6.4% Diabetes:              >6.4% Glycemic control for   <7.0% adults with diabetes     CBG: Recent Labs  Lab 10/09/17 1137 10/09/17 1606 10/09/17 2022 10/10/17 0520 10/10/17 1133  GLUCAP 104* 132* 112* 107* 154*    Recent Results (from the past 240 hour(s))  MRSA PCR Screening     Status: None   Collection Time: 09/30/17  9:11 PM  Result Value Ref Range Status   MRSA by PCR NEGATIVE NEGATIVE Final    Comment:        The GeneXpert MRSA Assay (FDA approved for NASAL specimens only), is one component of a comprehensive MRSA  colonization surveillance program. It is not intended to diagnose MRSA infection nor to guide or monitor treatment for MRSA infections. Performed at [facility], [place] [address], [place], [place] [address]      Scheduled Meds: . apixaban  5 mg Oral BID  .  ARIPiprazole  5 mg Oral Daily  . [START ON 10/11/2017] aspirin  81 mg Oral Daily  . atorvastatin  40 mg Oral q1800  . carvedilol  3.125 mg Oral BID WC  . donepezil  10 mg Oral Daily  . FLUoxetine  40 mg Oral Daily  . furosemide  80 mg Oral BID  . insulin aspart  0-15 Units Subcutaneous TID WC  . insulin aspart  0-5 Units Subcutaneous QHS  . lamoTRIgine  100 mg Oral BID  . losartan  25 mg Oral Daily  . mouth rinse  15 mL Mouth Rinse BID  . potassium citrate  10 mEq Oral BID  . sodium chloride flush  3 mL Intravenous Q12H  . spironolactone  25 mg Oral Daily  . traZODone  100 mg Oral QHS     LOS: 10 days   Cherene Altes, MD [facility] Office  [phone] Pager - Text Page per Amion as per below:  On-Call/Text Page:      [email or web address]      password [email or web address]  If 7PM-7AM, please contact night-coverage [email or web address] Password [facility] 10/10/2017, 2:45 PM

## 2017-10-10 NOTE — Progress Notes (Addendum)
3:30pm Per MD plan for [facility] confirms they can accept on Saturday and have ordered all medications and equipment today for anticipated DC tomorrow  2pm CSW met with family this morning and provided offers- family planning to look at several facilities to decide and requested CSW expand search to some [place] called CSW at 2pm to inform that they are able to approve 32 days at contracted SNF- family informed but they want to pursue medicare placement because that would allow more options closer to home.  [facility] informed CSW that [place] social worker is [facility] [phone] ext [phone]- state if pt is still at hospital on Tuesday we could resubmit paperwork and that might allow him to be brought to [facility].  Family would like to choose [facility] for rehab- DME CPAP order sent to facility so they can order  Jorge Ny, [place] Worker [phone]

## 2017-10-10 NOTE — Progress Notes (Signed)
Progress Note  Patient Name: [name] Date of Encounter: 10/10/2017  Primary Cardiologist: Peter Martinique, MD   Subjective   Back from barium swallow- no chest pain  Inpatient Medications    Scheduled Meds: . apixaban  5 mg Oral BID  . ARIPiprazole  5 mg Oral Daily  . [START ON 10/11/2017] aspirin  81 mg Oral Daily  . atorvastatin  40 mg Oral q1800  . carvedilol  3.125 mg Oral BID WC  . donepezil  10 mg Oral Daily  . FLUoxetine  40 mg Oral Daily  . furosemide  80 mg Oral BID  . insulin aspart  0-15 Units Subcutaneous TID WC  . insulin aspart  0-5 Units Subcutaneous QHS  . lamoTRIgine  100 mg Oral BID  . losartan  25 mg Oral Daily  . mouth rinse  15 mL Mouth Rinse BID  . potassium citrate  10 mEq Oral BID  . sodium chloride flush  3 mL Intravenous Q12H  . spironolactone  25 mg Oral Daily  . traZODone  100 mg Oral QHS   Continuous Infusions: . sodium chloride     PRN Meds: sodium chloride, acetaminophen, fluticasone, nitroGLYCERIN, ondansetron (ZOFRAN) IV, RESOURCE THICKENUP CLEAR, sodium chloride flush   Vital Signs    Vitals:   10/10/17 0409 10/10/17 0415 10/10/17 0805 10/10/17 1029  BP: 116/65  110/60 111/68  Pulse:  68 69 66  Resp:  13 14   Temp: 99 F (37.2 C)  97.9 F (36.6 C)   TempSrc: Oral  Oral   SpO2:  97% 96% 97%  Weight:      Height:        Intake/Output Summary (Last 24 hours) at 10/10/2017 1145 Last data filed at 10/10/2017 0410 Gross per 24 hour  Intake 380 ml  Output 1950 ml  Net -1570 ml   Filed Weights   10/08/17 0300 10/09/17 0255 10/10/17 0304  Weight: 217 lb 13 oz (98.8 kg) 216 lb 14.9 oz (98.4 kg) 206 lb 5.6 oz (93.6 kg)    Telemetry    NSR- Personally Reviewed  Physical Exam   GEN: Obese; on CPAP, chronically ill appearing Neck: supple Cardiac: RRR Respiratory: decreased breath sounds,CTA GI: Soft, Not tender or distended MS: 2+ edema Neuro: moves all ext; somnolent   Labs    Chemistry Recent Labs  Lab  10/04/17 0254  10/07/17 0305 10/08/17 0313 10/08/17 2301 10/09/17 0421 10/10/17 0410  NA 133*   < > 140 137 131* 136 139  K 4.3   < > 4.0 3.8 3.7 3.8 3.5  CL 102   < > 104 101 96* 96* 98*  CO2 18*   < > 26 26 22 27 28   GLUCOSE 126*   < > 95 90 101* 102* 108*  BUN 32*   < > 20 15 11 11 7   CREATININE 1.10   < > 0.78 0.72 0.62 0.70 0.69  CALCIUM 7.9*   < > 7.8* 7.8* 7.6* 8.0* 8.1*  PROT 5.9*  --  5.2* 5.4*  --   --   --   ALBUMIN 2.7*  --  2.3* 2.2*  --   --   --   AST 329*  --  209* 116*  --   --   --   ALT 403*  --  393* 290*  --   --   --   ALKPHOS 379*  --  301* 251*  --   --   --  BILITOT 1.1  --  0.7 0.8  --   --   --   GFRNONAA >60   < > >60 >60 >60 >60 >60  GFRAA >60   < > >60 >60 >60 >60 >60  ANIONGAP 13   < > 10 10 13 13 13    < > = values in this interval not displayed.     Hematology Recent Labs  Lab 10/06/17 0335 10/07/17 0305 10/08/17 2301  WBC 13.1* 12.1* 10.8*  RBC 3.34* 3.50* 3.42*  HGB 9.6* 9.9* 9.8*  HCT 31.5* 32.6* 31.4*  MCV 94.3 93.1 91.8  MCH 28.7 28.3 28.7  MCHC 30.5 30.4 31.2  RDW 16.6* 16.5* 16.7*  PLT 120* 129* 177    Cardiac Enzymes Recent Labs  Lab 10/05/17 0426  TROPONINI 1.96*     Radiology    Dg Chest Port 1 View  Result Date: 10/09/2017 CLINICAL DATA:  Possible aspiration. History of diabetes, hypertension and coronary artery disease. EXAM: PORTABLE CHEST 1 VIEW COMPARISON:  10/08/2017. FINDINGS: Previous median sternotomy and CABG procedure. The heart size is enlarged. Aortic atherosclerosis noted. Pulmonary vascular congestion is noted. No airspace consolidation. IMPRESSION: 1. Cardiac enlargement and pulmonary vascular congestion. 2.  Aortic Atherosclerosis (ICD10-I70.0). Electronically Signed   By: Kerby Moors M.D.   On: 10/09/2017 07:55   Dg Chest Port 1 View  Result Date: 10/08/2017 CLINICAL DATA:  Cough and congestion EXAM: PORTABLE CHEST 1 VIEW COMPARISON:  Chest radiograph 10/04/2017 FINDINGS: Unchanged cardiomegaly.  Remote median sternotomy for CABG. No focal airspace consolidation or pulmonary edema. IMPRESSION: Cardiomegaly without focal airspace disease. Electronically Signed   By: Ulyses Jarred M.D.   On: 10/08/2017 22:30   Dg Swallowing Func-speech Pathology  Result Date: 10/10/2017 Objective Swallowing Evaluation: Type of Study: MBS-Modified Barium Swallow Study  Patient Details Name: [name] MRN: [id number] Date of Birth: 1946/05/14 Today's Date: 10/10/2017 Time: SLP Start Time (ACUTE ONLY): 8466 -SLP Stop Time (ACUTE ONLY): 0847 SLP Time Calculation (min) (ACUTE ONLY): 8 min Past Medical History: Past Medical History: Diagnosis Date . AKI (acute kidney injury) ([place])   a. 6/14: resolved after d/c ARB;  b. RA U/S 7/14: no RA stenosis . Arthritis   of spine . Atrial flutter ([place])   a. 2008 - one episode in setting of sepsis/shock. . Back pain   persistent . Coronary artery disease   a. s/p CABG 2005. b. low risk nuc 2016. Marland Kitchen Depression  . Diabetes mellitus   controled by diet . Diverticulitis   a. s/p partial colectomy remotely. Marland Kitchen GERD (gastroesophageal reflux disease)  . H/O alcohol abuse  . Headache(784.0)  . Hearing loss  . History of hiatal hernia  . Hx of echocardiogram   Echo 7/14:  Mod LVH, EF 55-60%, Gr 1 DD, mild MR, PASP 36 . Hyperlipidemia  . Hypertension  . Pulmonary embolism ([place]) 2005 . Renal stone 04/15/2014 . Seizures ([place])  . Sleep apnea   does not use CPAP . Stroke [facility])  Past Surgical History: Past Surgical History: Procedure Laterality Date . APPENDECTOMY   . BACK SURGERY   . CARDIAC CATHETERIZATION   . CERVICAL FUSION   . CORONARY ARTERY BYPASS GRAFT  2005  LIMA graft to LAD,saphenous vein graft to diag.,circumflex, marginal,and to the RCA . CYSTOSCOPY/RETROGRADE/URETEROSCOPY/STONE EXTRACTION WITH BASKET Left 04/16/2014  Procedure: CYSTOSCOPY/RETROGRADE/LEFT URETEROSCOPY/STONE EXTRACTION WITH BASKET AND LASER LITHOTRIPSY;  Surgeon: Malka So, MD;  Location: [facility];  Service: Urology;   Laterality: Left;  With STENT . ESOPHAGOGASTRODUODENOSCOPY  02/10/2012  Procedure: ESOPHAGOGASTRODUODENOSCOPY (EGD);  Surgeon: Cleotis Nipper, MD;  Location: [facility] ENDOSCOPY;  Service: Endoscopy;  Laterality: N/A; . ESOPHAGOGASTRODUODENOSCOPY  06/29/2012  Procedure: ESOPHAGOGASTRODUODENOSCOPY (EGD);  Surgeon: Winfield Cunas., MD;  Location: [facility];  Service: Endoscopy;  Laterality: N/A; . FOREIGN BODY REMOVAL  02/10/2012  Procedure: FOREIGN BODY REMOVAL;  Surgeon: Cleotis Nipper, MD;  Location: [place];  Service: Endoscopy;  Laterality: N/A; . FOREIGN BODY REMOVAL  06/29/2012  Procedure: FOREIGN BODY REMOVAL;  Surgeon: Winfield Cunas., MD;  Location: [facility] ENDOSCOPY;  Service: Endoscopy;  Laterality: N/A; . LEFT HEART CATHETERIZATION WITH CORONARY ANGIOGRAM N/A 04/02/2012  Procedure: LEFT HEART CATHETERIZATION WITH CORONARY ANGIOGRAM;  Surgeon: Sinclair Grooms, MD;  Location: [facility] C[facility] LAB;  Service: Cardiovascular;  Laterality: N/A; . LOOP RECORDER IMPLANT N/A 11/17/2013  Procedure: LOOP RECORDER IMPLANT;  Surgeon: Deboraha Sprang, MD;  Location: [facility] CATH LAB;  Service: Cardiovascular;  Laterality: N/A; . ORCHIECTOMY   . PARTIAL COLECTOMY    for diverticuli . SAVORY DILATION  06/29/2012  Procedure: SAVORY DILATION;  Surgeon: Winfield Cunas., MD;  Location: [facility]OS[facility];  Service: Endoscopy;  Laterality: N/A; . TEE WITHOUT CARDIOVERSION N/A 11/17/2013  Procedure: TRANSESOPHAGEAL ECHOCARDIOGRAM (TEE);  Surgeon: Larey Dresser, MD;  Location: [facility] ENDOSCOPY;  Service: Cardiovascular;  Laterality: N/A; . UMBILICAL HERNIA REPAIR   HPI: 72 yo WM PMHxseizures, depression,CVA; OSA not on CPAP; seizures; HTN; HLD; Dementia; ETOH abuse; DM(diet-controlled); CAD s/p CABG 1638; chronic diastolic CHF 4536, atrial flutter, AKI, Diverticulitis s/p partial colectomy, hiatal hernia, PE 2005, nephrolithiasis 04/15/2014. SLP evaluated 11/15/13 with findings of functional oropharygneal swallow without evidence of  aspiration. Regular diet with thin liquids was recommended. Noted history of esophageal dysphagia, s/p balloon dilation 10/06/13.  Bedside swallow eval 10/04/17 revealed concerns for primary esophageal dysphagia.  2/09: DGEsophagus:Large retained fluid bolus and possible food impaction noted. This food bolus moved from the upper thoracic esophagus to the lower esophagus during the study. There is poor distention and expansion of the distal esophagus.-Cannot exclude distal esophageal stricture. Severe dysmotility. Repeat esophagram 2/12: no evidence of food bolus orfood impaction. It showed significant tertiary contraction. No evidence of stricture or mass. GI recommended advancing diet; pt started clear liquids; then noted to be coughing, developed rales/rhonchi and repeat SLP evaluation was ordered.   Subjective: alert Assessment / Plan / Recommendation CHL IP CLINICAL IMPRESSIONS 10/10/2017 Clinical Impression Pt exhibited adequate oropharyngeal swallow function without laryngeal penetration or aspiration and suspect primary esophageal dysphagia. Epiglottic inversion to protect larynx was within normal limits. Oral and phayrngeal transit of barium pill adequate. Pt coughed throughout study without pharyngeal residue present with scan of esophagus revealing significant stasis and evidence of diagnosis this week of severe esophageal dysmotility likely irritating vagus nerve to trigger cough response. Risk for post prandial aspiration relativley high. Although clear liquids will better facilitate esophageal transfer, this is likely an chronic, long term issue therefore will initiate Dys 2 texture, thin liquids, alternate liquids and solids, straws allow and educated briefly on esophageal precautions.    SLP Visit Diagnosis Dysphagia, unspecified (R13.10) Attention and concentration deficit following -- Frontal lobe and executive function deficit following -- Impact on safety and function Mild aspiration risk   CHL IP  TREATMENT RECOMMENDATION 10/10/2017 Treatment Recommendations Therapy as outlined in treatment plan below   Prognosis 10/10/2017 Prognosis for Safe Diet Advancement (No Data) Barriers to Reach Goals -- Barriers/Prognosis Comment -- CHL IP DIET RECOMMENDATION 10/10/2017 SLP Diet Recommendations Thin  liquid;Dysphagia 2 (Fine chop) solids Liquid Administration via Cup;Straw Medication Administration Whole meds with liquid Compensations Slow rate;Small sips/bites;Follow solids with liquid Postural Changes Remain semi-upright after after feeds/meals (Comment);Seated upright at 90 degrees   CHL IP OTHER RECOMMENDATIONS 10/10/2017 Recommended Consults -- Oral Care Recommendations Oral care BID Other Recommendations --   CHL IP FOLLOW UP RECOMMENDATIONS 10/10/2017 Follow up Recommendations None   CHL IP FREQUENCY AND DURATION 10/10/2017 Speech Therapy Frequency (ACUTE ONLY) min 2x/week Treatment Duration 2 weeks      CHL IP ORAL PHASE 10/10/2017 Oral Phase WFL Oral - Pudding Teaspoon -- Oral - Pudding Cup -- Oral - Honey Teaspoon -- Oral - Honey Cup -- Oral - Nectar Teaspoon -- Oral - Nectar Cup -- Oral - Nectar Straw -- Oral - Thin Teaspoon -- Oral - Thin Cup -- Oral - Thin Straw -- Oral - Puree -- Oral - Mech Soft -- Oral - Regular -- Oral - Multi-Consistency -- Oral - Pill -- Oral Phase - Comment --  CHL IP PHARYNGEAL PHASE 10/10/2017 Pharyngeal Phase WFL Pharyngeal- Pudding Teaspoon -- Pharyngeal -- Pharyngeal- Pudding Cup -- Pharyngeal -- Pharyngeal- Honey Teaspoon -- Pharyngeal -- Pharyngeal- Honey Cup -- Pharyngeal -- Pharyngeal- Nectar Teaspoon -- Pharyngeal -- Pharyngeal- Nectar Cup -- Pharyngeal -- Pharyngeal- Nectar Straw -- Pharyngeal -- Pharyngeal- Thin Teaspoon -- Pharyngeal -- Pharyngeal- Thin Cup -- Pharyngeal -- Pharyngeal- Thin Straw -- Pharyngeal -- Pharyngeal- Puree -- Pharyngeal -- Pharyngeal- Mechanical Soft -- Pharyngeal -- Pharyngeal- Regular -- Pharyngeal -- Pharyngeal- Multi-consistency -- Pharyngeal --  Pharyngeal- Pill -- Pharyngeal -- Pharyngeal Comment --  CHL IP CERVICAL ESOPHAGEAL PHASE 10/10/2017 Cervical Esophageal Phase WFL Pudding Teaspoon -- Pudding Cup -- Honey Teaspoon -- Honey Cup -- Nectar Teaspoon -- Nectar Cup -- Nectar Straw -- Thin Teaspoon -- Thin Cup -- Thin Straw -- Puree -- Mechanical Soft -- Regular -- Multi-consistency -- Pill -- Cervical Esophageal Comment -- No flowsheet data found. [name] 10/10/2017, 11:27 AM [name].Ed CCC-SLP Pager [phone]               Patient Profile     72 y.o. 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  Patient ruled in for a non-ST elevation myocardial infarction and echocardiogram shows newly reduced LV function with ejection fraction 25%.  Assessment & Plan    1 NSTEMI: Plan to continue aspirin, (decrease dose to 81 mg), statin and beta-blocker. He remains weak and not ambulating. Needs rehab and physical therapy.  We can consider catheterization in the future if he improves physically.  If he does not improve best option may be medical therapy.  2 Acute systolic heart failure: On Lasix to 80 mg BID; continue spironolactone 25 mg daily.  Follow renal function closely.  Continue coreg and cozaar.  3 Sepsis-per IM.  4 H/O PE- heparin changed to Eliquis.  5. LVD- EF was 50-55% in jan 2019- now 20-25% by echo 10/01/17.  6. Esophageal stricture- f/u BA swallow today.   For questions or updates, please contact [place] Please consult [email or web address] for contact info under Cardiology/STEMI.      Signed, Kerin Ransom, PA-C  10/10/2017, 11:45 AM    I have seen and examined the patient along with Kerin Ransom, PA.  I have reviewed the chart, notes and new data.  I  agree with PA's note.  Key new  complaints: He appears ill and fragile.  He is alert and appears to be oriented.  Answers most questions coherently, but usually in single syllables and very slowly.  Reportedly did well on sleep study and he has a more liberalized diet now. Key examination changes: No overt signs of hypervolemia by physical exam Key new findings / data: Stable renal function, low normal potassium level  PLAN: Had a medium sized non-ST segment elevation myocardial infarction related to demand ischemia in the setting of sepsis.  Peak troponin 12. echo shows a vast disproportionate reduction in left ventricular systolic function with EF down to 20-25%, likely due to global myocardial ischemia. Suggest repeating an echocardiogram no sooner than next week to see if there has been recovery of myocardial stunning.  If he is transferred to a nursing facility by then, the echo can be delayed until he is discharged from rehab. Would only consider invasive coronary angiography if the echo still shows substantial left ventricular dysfunction and if he has shown substantial recovery from current poor functional status.  This point he remains a very poor candidate for aggressive evaluation and intervention.   Sanda Klein, MD, [place] [phone] 10/10/2017, 11:59 AM

## 2017-10-10 NOTE — Consult Note (Signed)
Physical Medicine and Rehabilitation Consult Reason for Consult: Decreased functional mobility Referring Physician: [facility]   HPI: [name] is a 72 y.o. right handed male with history of CVA, seizure disorder, COPD with chronic oxygen, hypertension, dementia, alcohol abuse, diabetes mellitus, CAD with CABG, diastolic congestive heart failure. Per chart review patient lives with spouse. Has a home health aide 3 hours a day for showers and dressing. He does use 3 L of oxygen at home. Patient uses a rollator in the home. He does not drive. One level home with ramped entrance. Family assistance as needed. Presented to 5 2019 with increasing chronic cough, productive sputum headache. Family had noted some altered mental status progressing over the past few months. Patient with recent admission in January for Clostridium difficile discharged home on oral vancomycin. He was discharged to [facility] and then transferred to the [place] in [place] for rehabilitation. Wife and noted progressive weakness recent fall question left side weakness. CT of the head showed atrophy with prior infarcts and areas of small vessel disease no acute infarction noted. CT angiogram the chest showed no evidence of aneurysm or dissection. Chest x-ray no evidence of pneumonia no overt congestive failure. WBC 15,500, creatinine 1.32, troponin 13.14, influenza panel negative, lactic acid 3.51, MRSA negative, follow-up C. difficile negative. Cardiology service is consulted for elevated troponin and EKG without acute changes. Placed on intravenous heparin later discontinued 10/09/2017 and currently maintained on Eliquis for suspect NSTEMI. Gastroenterology consulted after modified barium swallow showed possible distal esophageal stricture. KUB negative. Repeat barium swallow showed no impacted esophageal food esophageal mass or stricture and diet slowly advanced. Physical and occupational therapy evaluations completed 10/08/2017 with  recommendations of physical medicine rehabilitation consult.   Review of Systems  Constitutional: Positive for malaise/fatigue. Negative for fever.  HENT: Positive for hearing loss.   Eyes: Negative for blurred vision and double vision.  Respiratory: Positive for cough and shortness of breath.   Cardiovascular: Positive for palpitations and leg swelling.  Gastrointestinal: Positive for constipation. Negative for nausea.       GERD  Genitourinary: Positive for urgency. Negative for dysuria and hematuria.  Musculoskeletal: Positive for back pain.  Neurological: Positive for seizures and headaches.  Psychiatric/Behavioral: Positive for depression.  All other systems reviewed and are negative.  Past Medical History:  Diagnosis Date  . AKI (acute kidney injury) ([place])    a. 6/14: resolved after d/c ARB;  b. RA U/S 7/14: no RA stenosis  . Arthritis    of spine  . Atrial flutter ([place])    a. 2008 - one episode in setting of sepsis/shock.  . Back pain    persistent  . Coronary artery disease    a. s/p CABG 2005. b. low risk nuc 2016.  Marland Kitchen Depression   . Diabetes mellitus    controled by diet  . Diverticulitis    a. s/p partial colectomy remotely.  Marland Kitchen GERD (gastroesophageal reflux disease)   . H/O alcohol abuse   . Headache(784.0)   . Hearing loss   . History of hiatal hernia   . Hx of echocardiogram    Echo 7/14:  Mod LVH, EF 55-60%, Gr 1 DD, mild MR, PASP 36  . Hyperlipidemia   . Hypertension   . Pulmonary embolism ([place]) 2005  . Renal stone 04/15/2014  . Seizures ([place])   . Sleep apnea    does not use CPAP  . Stroke [facility])    Past Surgical History:  Procedure  Laterality Date  . APPENDECTOMY    . BACK SURGERY    . CARDIAC CATHETERIZATION    . CERVICAL FUSION    . CORONARY ARTERY BYPASS GRAFT  2005   LIMA graft to LAD,saphenous vein graft to diag.,circumflex, marginal,and to the RCA  . CYSTOSCOPY/RETROGRADE/URETEROSCOPY/STONE EXTRACTION WITH BASKET Left 04/16/2014    Procedure: CYSTOSCOPY/RETROGRADE/LEFT URETEROSCOPY/STONE EXTRACTION WITH BASKET AND LASER LITHOTRIPSY;  Surgeon: Malka So, MD;  Location: [facility];  Service: Urology;  Laterality: Left;  With STENT  . ESOPHAGOGASTRODUODENOSCOPY  02/10/2012   Procedure: ESOPHAGOGASTRODUODENOSCOPY (EGD);  Surgeon: Cleotis Nipper, MD;  Location: [facility] ENDOSCOPY;  Service: Endoscopy;  Laterality: N/A;  . ESOPHAGOGASTRODUODENOSCOPY  06/29/2012   Procedure: ESOPHAGOGASTRODUODENOSCOPY (EGD);  Surgeon: Winfield Cunas., MD;  Location: [facility] ENDOSCOPY;  Service: Endoscopy;  Laterality: N/A;  . FOREIGN BODY REMOVAL  02/10/2012   Procedure: FOREIGN BODY REMOVAL;  Surgeon: Cleotis Nipper, MD;  Location: [facility];  Service: Endoscopy;  Laterality: N/A;  . FOREIGN BODY REMOVAL  06/29/2012   Procedure: FOREIGN BODY REMOVAL;  Surgeon: Winfield Cunas., MD;  Location: [facility] ENDOSCOPY;  Service: Endoscopy;  Laterality: N/A;  . LEFT HEART CATHETERIZATION WITH CORONARY ANGIOGRAM N/A 04/02/2012   Procedure: LEFT HEART CATHETERIZATION WITH CORONARY ANGIOGRAM;  Surgeon: Sinclair Grooms, MD;  Location: [facility] CATH LAB;  Service: Cardiovascular;  Laterality: N/A;  . LOOP RECORDER IMPLANT N/A 11/17/2013   Procedure: LOOP RECORDER IMPLANT;  Surgeon: Deboraha Sprang, MD;  Location: [facility] CATH LAB;  Service: Cardiovascular;  Laterality: N/A;  . ORCHIECTOMY    . PARTIAL COLECTOMY     for diverticuli  . SAVORY DILATION  06/29/2012   Procedure: SAVORY DILATION;  Surgeon: Winfield Cunas., MD;  Location: [facility] ENDOSCOPY;  Service: Endoscopy;  Laterality: N/A;  . TEE WITHOUT CARDIOVERSION N/A 11/17/2013   Procedure: TRANSESOPHAGEAL ECHOCARDIOGRAM (TEE);  Surgeon: Larey Dresser, MD;  Location: [facility] ENDOSCOPY;  Service: Cardiovascular;  Laterality: N/A;  . UMBILICAL HERNIA REPAIR     Family History  Problem Relation Age of Onset  . Emphysema Mother        was a smoker  . Asthma Mother   . Hypertension Mother   . Heart disease Father   . Prostate  cancer Paternal Grandfather   . Pancreatic cancer Paternal Uncle   . Stroke Paternal Grandmother   . Heart attack Neg Hx    Social History:  reports that  has never smoked. he has never used smokeless tobacco. He reports that he does not drink alcohol or use drugs. Allergies:  Allergies  Allergen Reactions  . Cephalexin     Unknown  . Doxycycline     Unknown  . Keflex [Cephalexin]   . Lac Bovis Other (See Comments)    lactose intolerant  . Pentazocine Lactate     He passed out- he had a seizure.  This occurred around 2000  . Talwin [Pentazocine] Other (See Comments)    hallucinations  . Tape Other (See Comments)    Paper tape only please.  . Zanaflex [Tizanidine Hcl] Other (See Comments)    Lightheaded and dizzy   Medications Prior to Admission  Medication Sig Dispense Refill  . acetaminophen (TYLENOL) 325 MG tablet Take 650 mg by mouth every 6 (six) hours as needed for moderate pain.    . ARIPiprazole (ABILIFY) 5 MG tablet Take 5 mg by mouth daily.     . Artificial Tear Solution (GENTEAL TEARS OP) Apply 1 drop to eye  as needed (dry eyes).    Marland Kitchen aspirin 325 MG EC tablet Take 325 mg daily by mouth.    Marland Kitchen atorvastatin (LIPITOR) 80 MG tablet Take 1 tablet (80 mg total) by mouth daily. 30 tablet 11  . camphor-menthol (SARNA) lotion Apply 1 application topically daily. To entire body    . donepezil (ARICEPT) 10 MG tablet Take 10 mg by mouth daily.     . Fluocinolone Acetonide 0.01 % OIL Apply 1 application topically daily as needed (itching). Apply to scalp    . FLUoxetine (PROZAC) 20 MG capsule Take 40 mg by mouth daily.    . fluticasone (FLONASE) 50 MCG/ACT nasal spray Place 1 spray into both nostrils 2 (two) times daily as needed for rhinitis.     . furosemide (LASIX) 20 MG tablet TAKE 1-2 TABLETS BY MOUTH EVERY DAY AS NEEDED (Patient taking differently: 20 mg mg DAILY in the morning (4am)) 60 tablet 3  . glimepiride (AMARYL) 2 MG tablet Take 1 mg by mouth daily with breakfast.  Patient takes one-half tablet (108m)  every day with breakfast    . lamoTRIgine (LAMICTAL) 200 MG tablet Take 100 mg by mouth 2 (two) times daily.    . Multiple Vitamin (MULTIVITAMIN) capsule Take 1 capsule by mouth daily.    . nitroGLYCERIN (NITROSTAT) 0.4 MG SL tablet Place 1 tablet (0.4 mg total) under the tongue every 5 (five) minutes as needed. For chest pain 25 tablet 1  . OXYGEN Inhale 2.5 L into the lungs.    . potassium citrate (UROCIT-K) 10 MEQ (1080 MG) SR tablet Take 10 mEq 2 (two) times daily by mouth.     . traZODone (DESYREL) 100 MG tablet Take 100 mg by mouth at bedtime.     . Triamcinolone Acetonide (TRIAMCINOLONE 0.1 % CREAM : EUCERIN) CREA Apply 1 application topically daily. TRIAMCINOLONE 1%/ Absorbase    . ONE TOUCH ULTRA TEST test strip USE TO TEST BLOOD SUGAR DAILY 100 each 0    Home: Home Living Family/patient expects to be discharged to:: Private residence Living Arrangements: Spouse/significant other Available Help at Discharge: Family, Available 24 hours/day, Personal care attendant Type of Home: House Home Access: Ramped entrance [place] One level Bathroom Shower/Tub: WMultimedia programmer Handicapped height [place] SCivil engineer, contracting [place] Hand held shower head, WEnvironmental consultant- 4 wheels, Walker - 2 wheels, Adaptive equipment, Other (comment) Adaptive Equipment: Reacher Additional Comments: has an aide 3 hours a day for showers and LB dressing (per previous admission note, pt did not report aide this admission, but he is also a poor historian).  Uses 3 L O2 [place] at home all the time.   Functional History: Prior Function Level of Independence: Needs assistance Gait / Transfers Assistance Needed: per pt report uses his rollator in the house, he does not drive ADL's / Homemaking Assistance Needed: aide assisting with ADL Communication / Swallowing Assistance Needed: HOH Functional Status:  Mobility: Bed Mobility Overal bed mobility:  Needs Assistance Bed Mobility: Rolling, Sidelying to Sit Rolling: Mod assist Sidelying to sit: Mod assist, +2 for physical assistance Supine to sit: Mod assist, HOB elevated Sit to supine: Max assist General bed mobility comments: Cues for sequencing and technique. HOB flat. Assist for LEs and trunk elevation. Assist to scoot hips out to EOB Transfers Overall transfer level: Needs assistance Equipment used: Rolling walker (2 wheeled) Transfers: Sit to/from Stand Sit to Stand: Mod assist, +2 physical assistance General transfer comment: Mod assist +2 to boost  up from EOB. Cues for hand placement with RW Ambulation/Gait Ambulation/Gait assistance: Mod assist, +2 physical assistance, +2 safety/equipment(+3 for chair follow.  ) Ambulation Distance (Feet): 6 Feet Assistive device: Rolling walker (2 wheeled) Gait Pattern/deviations: Decreased stride length, Step-to pattern, Shuffle, Trunk flexed, Narrow base of support General Gait Details: Pt performed slow shuffling pattern with forward head and flexed trunk. Cues provided for upper trunk and head control and to increased stride length.  Assist provided to facilitate weight shifting Gait velocity interpretation: Below normal speed for age/gender    ADL: ADL Overall ADL's : Needs assistance/impaired Eating/Feeding: Minimal assistance, Sitting Grooming: Minimal assistance, Sitting Upper Body Bathing: Moderate assistance, Sitting Lower Body Bathing: Maximal assistance, Sit to/from stand, +2 for physical assistance Upper Body Dressing : Moderate assistance, Sitting Lower Body Dressing: Maximal assistance, +2 for physical assistance, Sit to/from stand Toilet Transfer: Moderate assistance, +2 for physical assistance, Ambulation, RW Toilet Transfer Details (indicate cue type and reason): Simulated with transfer  Toileting- Clothing Manipulation and Hygiene: Total assistance, +2 for physical assistance, Bed level Toileting - Clothing  Manipulation Details (indicate cue type and reason): for peri care following incontinent BM Functional mobility during ADLs: Moderate assistance, +2 for physical assistance, Rolling walker General ADL Comments: VSS thorughout on 3L supplemental O2  Cognition: Cognition Overall Cognitive Status: No family/caregiver present to determine baseline cognitive functioning Orientation Level: Oriented to situation, Oriented to place, Oriented to person Cognition Arousal/Alertness: Awake/alert Behavior During Therapy: Flat affect Overall Cognitive Status: No family/caregiver present to determine baseline cognitive functioning General Comments: Per chart he has memory deficits at baseline and he is also very HOH making it difficult to tell if he just can't hear what is being said vs true cognitive deficits.  He does seem to be slow to process.   Blood pressure 116/65, pulse 65, temperature 99 F (37.2 C), temperature source Oral, resp. rate 14, height 5' 7"  (1.702 m), weight 93.6 kg (206 lb 5.6 oz), SpO2 95 %. Physical Exam  Vitals reviewed. HENT:  Head: Normocephalic.  Eyes:  Pupils round and reactive to light  Neck: Normal range of motion. Neck supple. No thyromegaly present.  Cardiovascular:  Cardiac rate controlled  Respiratory:  Difficulty clearing secretions. Wet voice  GI: Soft. Bowel sounds are normal. He exhibits no distension.  Neurological:  lethargic. Makes eye contact with examiner. Provides his name and age. Limited medical historian. Follow simple commands.   Skin: Skin is warm and dry.    Results for orders placed or performed during the hospital encounter of 09/30/17 (from the past 24 hour(s))  Glucose, capillary     Status: Abnormal   Collection Time: 10/09/17  5:55 AM  Result Value Ref Range   Glucose-Capillary 103 (H) 65 - 99 mg/dL  Glucose, capillary     Status: Abnormal   Collection Time: 10/09/17 11:37 AM  Result Value Ref Range   Glucose-Capillary 104 (H) 65 - 99  mg/dL   Comment 1 Notify RN   Glucose, capillary     Status: Abnormal   Collection Time: 10/09/17  4:06 PM  Result Value Ref Range   Glucose-Capillary 132 (H) 65 - 99 mg/dL   Comment 1 Notify RN    Comment 2 Document in Chart   Glucose, capillary     Status: Abnormal   Collection Time: 10/09/17  8:22 PM  Result Value Ref Range   Glucose-Capillary 112 (H) 65 - 99 mg/dL  Glucose, capillary     Status: Abnormal   Collection Time:  10/10/17  5:20 AM  Result Value Ref Range   Glucose-Capillary 107 (H) 65 - 99 mg/dL   Dg Chest Port 1 View  Result Date: 10/09/2017 CLINICAL DATA:  Possible aspiration. History of diabetes, hypertension and coronary artery disease. EXAM: PORTABLE CHEST 1 VIEW COMPARISON:  10/08/2017. FINDINGS: Previous median sternotomy and CABG procedure. The heart size is enlarged. Aortic atherosclerosis noted. Pulmonary vascular congestion is noted. No airspace consolidation. IMPRESSION: 1. Cardiac enlargement and pulmonary vascular congestion. 2.  Aortic Atherosclerosis (ICD10-I70.0). Electronically Signed   By: Kerby Moors M.D.   On: 10/09/2017 07:55   Dg Chest Port 1 View  Result Date: 10/08/2017 CLINICAL DATA:  Cough and congestion EXAM: PORTABLE CHEST 1 VIEW COMPARISON:  Chest radiograph 10/04/2017 FINDINGS: Unchanged cardiomegaly. Remote median sternotomy for CABG. No focal airspace consolidation or pulmonary edema. IMPRESSION: Cardiomegaly without focal airspace disease. Electronically Signed   By: Ulyses Jarred M.D.   On: 10/08/2017 22:30    Assessment/Plan: Diagnosis: Debility after multiple medical, hx of CVA 1. Does the need for close, 24 hr/day medical supervision in concert with the patient's rehab needs make it unreasonable for this patient to be served in a less intensive setting? No 2. Co-Morbidities requiring supervision/potential complications:   3. Due to bladder management, safety, skin/wound care and pain management, does the patient require 24 hr/day  rehab nursing? No 4. Does the patient require coordinated care of a physician, rehab nurse, PT, OT, SLP to address physical and functional deficits in the context of the above medical diagnosis(es)? No Addressing deficits in the following areas: balance, endurance, locomotion, strength, transferring, bowel/bladder control, bathing, dressing, feeding and grooming 5. Can the patient actively participate in an intensive therapy program of at least 3 hrs of therapy per day at least 5 days per week? No 6. The potential for patient to make measurable gains while on inpatient rehab is fair 7. Anticipated functional outcomes upon discharge from inpatient rehab are n/a  with PT, n/a with OT, n/a with SLP. 8. Estimated rehab length of stay to reach the above functional goals is: n/a 9. Anticipated D/C setting: Other 10. Anticipated post D/C treatments: [place] therapy 11. Overall Rehab/Functional Prognosis: fair  RECOMMENDATIONS: This patient's condition is appropriate for continued rehabilitative care in the following setting: SNF Patient has agreed to participate in recommended program. N/A Note that insurance prior authorization may be required for reimbursement for recommended care.  Comment:    Meredith Staggers, MD, [place] [facility] 10/10/2017    Lavon Paganini [place], PA-C 10/10/2017

## 2017-10-10 NOTE — Discharge Summary (Addendum)
Preliminary DISCHARGE SUMMARY  [name]  MR#: [id number]  DOB:11/06/1945  Date of Admission: 09/30/2017 Date of Discharge: 10/11/2017  Attending Physician:Jeffrey Hennie Duos, MD  Patient's VEH:MCNO, Michaelle Copas, MD  Consults: Cardiology  [facility]  Disposition: D/C to SNF   Follow-up Appts: Follow-up Information    Laurence Spates, MD. Schedule an appointment as soon as possible for a visit in 2 months.   Specialty:  Gastroenterology Why:  Follow-up for abnormal LFTs, anemia and chronic dysphagia  PLEASE  CALL  Contact information: [address] [place] [address] [phone]          Tests Needing Follow-up: -pt needs to be scheduled for a f/u TTE in the [facility] office following his d/c from SNF   Discharge Diagnoses: SIRS v/w Sepsis due to unknown source Esophageal stricture / Esophageal dysmotility NSTEMI Acute on chronic systolic CHF Mild transaminitis HTN  DM2 HLD Dementia  OSA  Initial presentation: 72 yo M w/ a Hxof Seizures, depression, CVA, OSA not on CPAP, HTN, HLD, Dementia, ETOH abuse, DM (diet-controlled), CAD s/p CABG 4650, chronic diastolic CHF, atrial flutter, diverticulitis s/p partial colectomy, hiatal hernia, PE 2005, nephrolithiasis, and C diff colitis requiring hospital admit 1/1 > 09/01/2017 who presented with weakness.  Workup in the ED suggested SIRS v/s sepsis, with no clear source.  Flu negative. CXR and CTa chest  negative. Urine unremarkable.   Hospital Course: SIRS v/w Sepsis due to unknown source Has completed a seven-day course antibiotics - no clear infectious source - stopped abx 2/11 - suspect this represented SIRS in the setting of aspiration pneumonitis   Esophageal stricture / Esophageal dysmotility Initial barium swallow positivefor impacted food/possible esophageal stricture - pt states has had multiple esophageal dilations in the past and was told he was not a candidate for further  dilations due to a high risk of perforation - GI has evaluated - f/u barium esophagram confirmed clearance of the food impaction, and no evidence of a stricture - MBS today further supports severe esophageal dysmotility - cont diet as per SLP recs - appears to be stabilizing at this time   NSTEMI Initial EKG with ST depressions concerning for ischemia - Troponin significantly elevated but trending down - Cardiology to consider cardiac catheterization after pt undergoes rehab - Cards suggests we cont ASA at 74m, statin, and BB   Acute on chronic systolic CHF TTE 23/[id number] 20-25% compared to 08/29/2017 EF 50-55% - lasix being dosed per Cards - cont ARB and BB and aldactone - f/u TTE suggested after d/c from rehab to help quide extent of further coronary evaluation   Mild transaminitis Unclear etiology - ?mild shock liver - slowly improving - acute hepatitis panel negative   HTN  BP controlled at this time   DM2 2/5 A1c 7.1 - CBG well controlled   HLD Resumed lipitor  Dementia  continue Aricept, Abilify, Lamictal, and Trazodone - appears clinically to be mild/mod  OSA Not on home CPAP but clinically needs - arrange for DME CPAP at SNF   Allergies as of 10/10/2017      Reactions   Cephalexin    Unknown   Doxycycline    Unknown   Keflex [cephalexin]    Lac Bovis Other (See Comments)   lactose intolerant   Pentazocine Lactate    He passed out- he had a seizure.  This occurred around 2000   Talwin [pentazocine] Other (See Comments)   hallucinations   Tape Other (See  Comments)   Paper tape only please.   Zanaflex [tizanidine Hcl] Other (See Comments)   Lightheaded and dizzy      Medication List    STOP taking these medications   glimepiride 2 MG tablet Commonly known as:  AMARYL   ONE TOUCH ULTRA TEST test strip Generic drug:  glucose blood     TAKE these medications   acetaminophen 325 MG tablet Commonly known as:  TYLENOL Take 2 tablets (650 mg total) by  mouth every 4 (four) hours as needed for mild pain, fever or headache. What changed:    when to take this  reasons to take this   apixaban 5 MG Tabs tablet Commonly known as:  ELIQUIS Take 1 tablet (5 mg total) by mouth 2 (two) times daily.   ARIPiprazole 5 MG tablet Commonly known as:  ABILIFY Take 1 tablet (5 mg total) by mouth daily. Start taking on:  10/11/2017   aspirin 81 MG EC tablet Take 1 tablet (81 mg total) by mouth daily. Start taking on:  10/11/2017 What changed:    medication strength  how much to take   atorvastatin 40 MG tablet Commonly known as:  LIPITOR Take 1 tablet (40 mg total) by mouth daily at 6 PM. What changed:    medication strength  how much to take  when to take this   camphor-menthol lotion Commonly known as:  SARNA Apply 1 application topically daily. To entire body   carvedilol 3.125 MG tablet Commonly known as:  COREG Take 1 tablet (3.125 mg total) by mouth 2 (two) times daily with a meal.   donepezil 10 MG tablet Commonly known as:  ARICEPT Take 1 tablet (10 mg total) by mouth daily. Start taking on:  10/11/2017   Fluocinolone Acetonide 0.01 % Oil Apply 1 application topically daily as needed (itching). Apply to scalp   FLUoxetine 40 MG capsule Commonly known as:  PROZAC Take 1 capsule (40 mg total) by mouth daily. Start taking on:  10/11/2017 What changed:  medication strength   fluticasone 50 MCG/ACT nasal spray Commonly known as:  FLONASE Place 1 spray into both nostrils 2 (two) times daily as needed for rhinitis.   furosemide 80 MG tablet Commonly known as:  LASIX Take 1 tablet (80 mg total) by mouth 2 (two) times daily. What changed:    medication strength  See the new instructions.   GENTEAL TEARS OP Apply 1 drop to eye as needed (dry eyes).   insulin aspart 100 UNIT/ML injection Commonly known as:  novoLOG Inject 0-15 Units into the skin 3 (three) times daily with meals.   insulin aspart 100 UNIT/ML  injection Commonly known as:  novoLOG Inject 0-5 Units into the skin at bedtime.   lamoTRIgine 100 MG tablet Commonly known as:  LAMICTAL Take 1 tablet (100 mg total) by mouth 2 (two) times daily. What changed:  medication strength   losartan 25 MG tablet Commonly known as:  COZAAR Take 1 tablet (25 mg total) by mouth daily. Start taking on:  10/11/2017   multivitamin capsule Take 1 capsule by mouth daily.   nitroGLYCERIN 0.4 MG SL tablet Commonly known as:  NITROSTAT Place 1 tablet (0.4 mg total) under the tongue every 5 (five) minutes as needed (as needed for chest pain or with symptoms of esophageal spasm). What changed:    reasons to take this  additional instructions   OXYGEN Inhale 2.5 L into the lungs.   potassium citrate 10 MEQ (1080 MG) SR  tablet Commonly known as:  UROCIT-K Take 1 tablet (10 mEq total) by mouth 2 (two) times daily.   spironolactone 25 MG tablet Commonly known as:  ALDACTONE Take 1 tablet (25 mg total) by mouth daily. Start taking on:  10/11/2017   traZODone 100 MG tablet Commonly known as:  DESYREL Take 1 tablet (100 mg total) by mouth at bedtime.   triamcinolone 0.1 % cream : eucerin Crea Apply 1 application topically daily. TRIAMCINOLONE 1%/ Absorbase            Durable Medical Equipment  (From admission, onward)        Start     Ordered   10/10/17 1453  For home use only DME continuous positive airway pressure (CPAP)  Once    Comments:  Full face mask - extra large size  Question Answer Comment  Patient has OSA or probable OSA Yes   Is the patient currently using CPAP in the home No   Date of face to face encounter 10/07/2017   Settings 5-10   Signs and symptoms of probable OSA  (select all that apply) Witnessed apneas   CPAP supplies needed Mask, headgear, cushions, filters, heated tubing and water chamber      10/10/17 1453      Day of Discharge BP 111/68 (BP Location: Left Arm)   Pulse 66   Temp 97.9 F (36.6 C)  (Oral)   Resp 19   Ht 5' 7"  (1.702 m)   Wt 93.6 kg (206 lb 5.6 oz)   SpO2 97%   BMI 32.32 kg/m   Physical Exam: General: No acute respiratory distress Lungs: Clear to auscultation bilaterally without wheezes or crackles Cardiovascular: Regular rate and rhythm without murmur gallop or rub normal S1 and S2 Abdomen: Nontender, nondistended, soft, bowel sounds positive, no rebound, no ascites, no appreciable mass Extremities: No significant cyanosis, clubbing, or edema bilateral lower extremities  Basic Metabolic Panel: Recent Labs  Lab 10/04/17 0254 10/05/17 0426 10/06/17 0335 10/07/17 0305 10/08/17 0313 10/08/17 2301 10/09/17 0421 10/10/17 0410  NA 133* 135 136 140 137 131* 136 139  K 4.3 4.4 4.2 4.0 3.8 3.7 3.8 3.5  CL 102 102 102 104 101 96* 96* 98*  CO2 18* 23 23 26 26 22 27 28   GLUCOSE 126* 86 93 95 90 101* 102* 108*  BUN 32* 27* 24* 20 15 11 11 7   CREATININE 1.10 0.92 0.91 0.78 0.72 0.62 0.70 0.69  CALCIUM 7.9* 7.7* 7.7* 7.8* 7.8* 7.6* 8.0* 8.1*  MG 2.3 2.4 2.4  --   --   --   --   --     Liver Function Tests: Recent Labs  Lab 10/04/17 0254 10/07/17 0305 10/08/17 0313  AST 329* 209* 116*  ALT 403* 393* 290*  ALKPHOS 379* 301* 251*  BILITOT 1.1 0.7 0.8  PROT 5.9* 5.2* 5.4*  ALBUMIN 2.7* 2.3* 2.2*   Coags: Recent Labs  Lab 10/07/17 0305  INR 1.36   CBC: Recent Labs  Lab 10/04/17 0254 10/05/17 0426 10/06/17 0335 10/07/17 0305 10/08/17 2301  WBC 13.9* 16.5* 13.1* 12.1* 10.8*  NEUTROABS  --   --   --   --  7.6  HGB 10.5* 9.7* 9.6* 9.9* 9.8*  HCT 33.9* 31.7* 31.5* 32.6* 31.4*  MCV 92.4 93.2 94.3 93.1 91.8  PLT 132* 124* 120* 129* 177    Cardiac Enzymes: Recent Labs  Lab 10/05/17 0426  TROPONINI 1.96*   BNP (last 3 results) Recent Labs  08/26/17 2051  BNP 533.3*    CBG: Recent Labs  Lab 10/09/17 1137 10/09/17 1606 10/09/17 2022 10/10/17 0520 10/10/17 1133  GLUCAP 104* 132* 112* 107* 154*    Recent Results (from the past 240  hour(s))  MRSA PCR Screening     Status: None   Collection Time: 09/30/17  9:11 PM  Result Value Ref Range Status   MRSA by PCR NEGATIVE NEGATIVE Final    Comment:        The GeneXpert MRSA Assay (FDA approved for NASAL specimens only), is one component of a comprehensive MRSA colonization surveillance program. It is not intended to diagnose MRSA infection nor to guide or monitor treatment for MRSA infections. Performed at [facility], [place] [address], [place], [place] [address]      Time spent in discharge (includes decision making & examination of pt): 35 minutes  10/10/2017, 3:25 PM   Cherene Altes, MD [facility] Office  [phone] Pager [phone]  On-Call/Text Page:      [email or web address]      password [facility]

## 2017-10-10 NOTE — Progress Notes (Signed)
Modified Barium Swallow Progress Note  Patient Details  Name: [name] MRN: [id number] Date of Birth: 08-03-1946  Today's Date: 10/10/2017  Modified Barium Swallow completed.  Full report located under Chart Review in the Imaging Section.  Brief recommendations include the following:  Clinical Impression  Pt exhibited adequate oropharyngeal swallow function without laryngeal penetration or aspiration and suspect primary esophageal dysphagia. Epiglottic inversion to protect larynx was within normal limits. Oral and phayrngeal transit of barium pill adequate. Pt coughed throughout study without pharyngeal residue present with scan of esophagus revealing significant stasis and evidence of diagnosis this week of severe esophageal dysmotility likely irritating vagus nerve to trigger cough response. Risk for post prandial aspiration relativley high. Although clear liquids will better facilitate esophageal transfer, this is likely an chronic, long term issue therefore will initiate Dys 2 texture, thin liquids, alternate liquids and solids, straws allow and educated briefly on esophageal precautions.      Swallow Evaluation Recommendations       SLP Diet Recommendations: Thin liquid;Dysphagia 2 (Fine chop) solids   Liquid Administration via: Cup;Straw   Medication Administration: Whole meds with liquid   Supervision: Patient able to self feed   Compensations: Slow rate;Small sips/bites;Follow solids with liquid   Postural Changes: Remain semi-upright after after feeds/meals (Comment);Seated upright at 90 degrees   Oral Care Recommendations: Oral care BID        [name] 10/10/2017,11:28 AM  .[name].Ed [employer] [phone]

## 2017-10-10 NOTE — Consult Note (Addendum)
   [facility] CM Inpatient Consult   10/10/2017  [name] February 01, 1946 [id number]    Patient screened for multiple hospitalizations. Verified with [name] during last hospitalization that he does not have a [facility] Provider.   Spoke with inpatient RNCM to make aware of above notes.   Discharge plan is likely SNF.  Unable to follow for [facility] Care Management due to patient not having a [place], MSN-Ed, RN,BSN [facility] Liaison [phone]

## 2017-10-10 NOTE — Progress Notes (Signed)
Physical Therapy Treatment Patient Details Name: [name] MRN: [id number] DOB: [birth year] Today's Date: 10/10/2017    History of Present Illness 72 y.o. male admitted on 09/30/17 for weakness, dx with sepsis and NSTEMI, and acute systolic heart failure.  Pt with significant PMH of stroke, seizures, PE, HTN, DM, CAD, back pain, A-flutter, AKI, cervical fusion and back surgery.     PT Comments    Pt performed slow shuffling short gait trial. He is progressing slower than anticipated and will require a longer recovery process.  Will inform supervising PT of need for SNF placement at this time.  Plan next session for continued functional mobility during hospital stay.      Follow Up Recommendations  SNF;Supervision/Assistance - 24 hour     Equipment Recommendations  Hospital bed    Recommendations for Other Services       Precautions / Restrictions Precautions Precautions: Fall Restrictions Weight Bearing Restrictions: No    Mobility  Bed Mobility Overal bed mobility: Needs Assistance Bed Mobility: Supine to Sit Rolling: Mod assist;+2 for physical assistance Sidelying to sit: Mod assist;+2 for physical assistance       General bed mobility comments: Cues for sequencing and technique. HOB flat. Assist for LEs and trunk elevation. Assist to scoot hips out to EOB  Transfers Overall transfer level: Needs assistance Equipment used: Rolling walker (2 wheeled) Transfers: Sit to/from Stand Sit to Stand: Mod assist;+2 physical assistance         General transfer comment: Pt slow to ascend required mod assistance+2  to boost into standing.  Cues for hand placement, knee extension, and trunk extension.    Ambulation/Gait Ambulation/Gait assistance: Mod assist;+2 physical assistance;+2 safety/equipment Ambulation Distance (Feet): 6 Feet Assistive device: Rolling walker (2 wheeled) Gait Pattern/deviations: Decreased stride length;Step-to pattern;Shuffle;Trunk flexed;Narrow  base of support   Gait velocity interpretation: Below normal speed for age/gender General Gait Details: Pt performed slow shuffling pattern with forward head and flexed trunk. Cues provided for upper trunk and head control and to increased stride length.  Assist provided to facilitate weight shifting.  Pt remains to fatigue quickly.     Stairs            Wheelchair Mobility    Modified Rankin (Stroke Patients Only)       Balance     Sitting balance-Leahy Scale: Fair       Standing balance-Leahy Scale: Poor                              Cognition Arousal/Alertness: Awake/alert Behavior During Therapy: Flat affect Overall Cognitive Status: No family/caregiver present to determine baseline cognitive functioning                                 General Comments: Per chart he has memory deficits at baseline and he is also very HOH making it difficult to tell if he just can't hear what is being said vs true cognitive deficits.  He does seem to be slow to process.       Exercises      General Comments        Pertinent Vitals/Pain Pain Assessment: 0-10 Faces Pain Scale: Hurts a little bit Pain Location: buttocks Pain Descriptors / Indicators: Discomfort;Sore Pain Intervention(s): Monitored during session;Repositioned    Home Living  Prior Function            PT Goals (current goals can now be found in the care plan section) Acute Rehab PT Goals Patient Stated Goal: none stated Potential to Achieve Goals: Fair Progress towards PT goals: Progressing toward goals    Frequency    Min 3X/week      PT Plan Discharge plan needs to be updated    Co-evaluation              AM-PAC PT "6 Clicks" Daily Activity  Outcome Measure  Difficulty turning over in bed (including adjusting bedclothes, sheets and blankets)?: Unable Difficulty moving from lying on back to sitting on the side of the bed? :  Unable Difficulty sitting down on and standing up from a chair with arms (e.g., wheelchair, bedside commode, etc,.)?: Unable Help needed moving to and from a bed to chair (including a wheelchair)?: A Lot Help needed walking in hospital room?: A Lot Help needed climbing 3-5 steps with a railing? : Total 6 Click Score: 8    End of Session Equipment Utilized During Treatment: Oxygen;Gait belt(3L, close chair follow) Activity Tolerance: Patient limited by fatigue;Patient limited by pain;Patient limited by lethargy Patient left: in bed;with call bell/phone within reach;with bed alarm set Nurse Communication: Mobility status PT Visit Diagnosis: Muscle weakness (generalized) (M62.81);Difficulty in walking, not elsewhere classified (R26.2)     Time: 4580-9983 PT Time Calculation (min) (ACUTE ONLY): 23 min  Charges:  $Gait Training: 8-22 mins $Therapeutic Activity: 8-22 mins                    G Codes:       Governor Rooks, PTA pager [phone]    Cristela Blue 10/10/2017, 4:54 PM

## 2017-10-11 DIAGNOSIS — D649 Anemia, unspecified: Secondary | ICD-10-CM | POA: Diagnosis not present

## 2017-10-11 DIAGNOSIS — R6510 Systemic inflammatory response syndrome (SIRS) of non-infectious origin without acute organ dysfunction: Secondary | ICD-10-CM | POA: Diagnosis not present

## 2017-10-11 DIAGNOSIS — E1159 Type 2 diabetes mellitus with other circulatory complications: Secondary | ICD-10-CM | POA: Diagnosis not present

## 2017-10-11 DIAGNOSIS — E1165 Type 2 diabetes mellitus with hyperglycemia: Secondary | ICD-10-CM | POA: Diagnosis not present

## 2017-10-11 DIAGNOSIS — I2699 Other pulmonary embolism without acute cor pulmonale: Secondary | ICD-10-CM | POA: Diagnosis not present

## 2017-10-11 DIAGNOSIS — I214 Non-ST elevation (NSTEMI) myocardial infarction: Secondary | ICD-10-CM | POA: Diagnosis not present

## 2017-10-11 DIAGNOSIS — K912 Postsurgical malabsorption, not elsewhere classified: Secondary | ICD-10-CM | POA: Diagnosis not present

## 2017-10-11 DIAGNOSIS — I5023 Acute on chronic systolic (congestive) heart failure: Secondary | ICD-10-CM | POA: Diagnosis not present

## 2017-10-11 DIAGNOSIS — I2510 Atherosclerotic heart disease of native coronary artery without angina pectoris: Secondary | ICD-10-CM | POA: Diagnosis not present

## 2017-10-11 DIAGNOSIS — G309 Alzheimer's disease, unspecified: Secondary | ICD-10-CM | POA: Diagnosis not present

## 2017-10-11 DIAGNOSIS — E785 Hyperlipidemia, unspecified: Secondary | ICD-10-CM | POA: Diagnosis not present

## 2017-10-11 DIAGNOSIS — K210 Gastro-esophageal reflux disease with esophagitis: Secondary | ICD-10-CM | POA: Diagnosis not present

## 2017-10-11 DIAGNOSIS — R748 Abnormal levels of other serum enzymes: Secondary | ICD-10-CM | POA: Diagnosis not present

## 2017-10-11 DIAGNOSIS — Z86711 Personal history of pulmonary embolism: Secondary | ICD-10-CM | POA: Diagnosis not present

## 2017-10-11 DIAGNOSIS — A048 Other specified bacterial intestinal infections: Secondary | ICD-10-CM | POA: Diagnosis not present

## 2017-10-11 DIAGNOSIS — K224 Dyskinesia of esophagus: Secondary | ICD-10-CM | POA: Diagnosis not present

## 2017-10-11 DIAGNOSIS — F0280 Dementia in other diseases classified elsewhere without behavioral disturbance: Secondary | ICD-10-CM | POA: Diagnosis not present

## 2017-10-11 DIAGNOSIS — I63421 Cerebral infarction due to embolism of right anterior cerebral artery: Secondary | ICD-10-CM | POA: Diagnosis not present

## 2017-10-11 DIAGNOSIS — R29898 Other symptoms and signs involving the musculoskeletal system: Secondary | ICD-10-CM | POA: Diagnosis not present

## 2017-10-11 DIAGNOSIS — E118 Type 2 diabetes mellitus with unspecified complications: Secondary | ICD-10-CM | POA: Diagnosis not present

## 2017-10-11 DIAGNOSIS — G4709 Other insomnia: Secondary | ICD-10-CM | POA: Diagnosis not present

## 2017-10-11 DIAGNOSIS — I5022 Chronic systolic (congestive) heart failure: Secondary | ICD-10-CM | POA: Diagnosis not present

## 2017-10-11 DIAGNOSIS — J80 Acute respiratory distress syndrome: Secondary | ICD-10-CM | POA: Diagnosis not present

## 2017-10-11 DIAGNOSIS — R9389 Abnormal findings on diagnostic imaging of other specified body structures: Secondary | ICD-10-CM | POA: Diagnosis not present

## 2017-10-11 DIAGNOSIS — Z9989 Dependence on other enabling machines and devices: Secondary | ICD-10-CM | POA: Diagnosis not present

## 2017-10-11 DIAGNOSIS — I272 Pulmonary hypertension, unspecified: Secondary | ICD-10-CM | POA: Diagnosis not present

## 2017-10-11 DIAGNOSIS — I5033 Acute on chronic diastolic (congestive) heart failure: Secondary | ICD-10-CM | POA: Diagnosis not present

## 2017-10-11 DIAGNOSIS — T17908D Unspecified foreign body in respiratory tract, part unspecified causing other injury, subsequent encounter: Secondary | ICD-10-CM

## 2017-10-11 DIAGNOSIS — T17890D Other foreign object in other parts of respiratory tract causing asphyxiation, subsequent encounter: Secondary | ICD-10-CM | POA: Diagnosis not present

## 2017-10-11 DIAGNOSIS — R5381 Other malaise: Secondary | ICD-10-CM | POA: Diagnosis not present

## 2017-10-11 DIAGNOSIS — R945 Abnormal results of liver function studies: Secondary | ICD-10-CM | POA: Diagnosis not present

## 2017-10-11 DIAGNOSIS — G4733 Obstructive sleep apnea (adult) (pediatric): Secondary | ICD-10-CM | POA: Diagnosis not present

## 2017-10-11 DIAGNOSIS — K5289 Other specified noninfective gastroenteritis and colitis: Secondary | ICD-10-CM | POA: Diagnosis not present

## 2017-10-11 DIAGNOSIS — A419 Sepsis, unspecified organism: Secondary | ICD-10-CM | POA: Diagnosis not present

## 2017-10-11 DIAGNOSIS — R197 Diarrhea, unspecified: Secondary | ICD-10-CM | POA: Diagnosis not present

## 2017-10-11 DIAGNOSIS — F0150 Vascular dementia without behavioral disturbance: Secondary | ICD-10-CM | POA: Diagnosis not present

## 2017-10-11 DIAGNOSIS — I25810 Atherosclerosis of coronary artery bypass graft(s) without angina pectoris: Secondary | ICD-10-CM | POA: Diagnosis not present

## 2017-10-11 DIAGNOSIS — J690 Pneumonitis due to inhalation of food and vomit: Secondary | ICD-10-CM | POA: Diagnosis not present

## 2017-10-11 DIAGNOSIS — A0472 Enterocolitis due to Clostridium difficile, not specified as recurrent: Secondary | ICD-10-CM | POA: Diagnosis not present

## 2017-10-11 DIAGNOSIS — J449 Chronic obstructive pulmonary disease, unspecified: Secondary | ICD-10-CM | POA: Diagnosis not present

## 2017-10-11 DIAGNOSIS — I10 Essential (primary) hypertension: Secondary | ICD-10-CM | POA: Diagnosis not present

## 2017-10-11 DIAGNOSIS — L293 Anogenital pruritus, unspecified: Secondary | ICD-10-CM | POA: Diagnosis not present

## 2017-10-11 DIAGNOSIS — Z8673 Personal history of transient ischemic attack (TIA), and cerebral infarction without residual deficits: Secondary | ICD-10-CM | POA: Diagnosis not present

## 2017-10-11 DIAGNOSIS — R0689 Other abnormalities of breathing: Secondary | ICD-10-CM | POA: Diagnosis not present

## 2017-10-11 DIAGNOSIS — R0789 Other chest pain: Secondary | ICD-10-CM | POA: Diagnosis not present

## 2017-10-11 DIAGNOSIS — K219 Gastro-esophageal reflux disease without esophagitis: Secondary | ICD-10-CM | POA: Diagnosis not present

## 2017-10-11 DIAGNOSIS — I519 Heart disease, unspecified: Secondary | ICD-10-CM | POA: Diagnosis not present

## 2017-10-11 LAB — COMPREHENSIVE METABOLIC PANEL
ALBUMIN: 2.4 g/dL — AB (ref 3.5–5.0)
ALT: 115 U/L — AB (ref 17–63)
AST: 33 U/L (ref 15–41)
Alkaline Phosphatase: 183 U/L — ABNORMAL HIGH (ref 38–126)
Anion gap: 13 (ref 5–15)
BUN: 9 mg/dL (ref 6–20)
CHLORIDE: 97 mmol/L — AB (ref 101–111)
CO2: 28 mmol/L (ref 22–32)
CREATININE: 0.86 mg/dL (ref 0.61–1.24)
Calcium: 8.1 mg/dL — ABNORMAL LOW (ref 8.9–10.3)
GFR calc Af Amer: >60 mL/min (ref >60)
GLUCOSE: 117 mg/dL — AB (ref 65–99)
POTASSIUM: 3.6 mmol/L (ref 3.5–5.1)
Sodium: 138 mmol/L (ref 135–145)
Total Bilirubin: 0.7 mg/dL (ref 0.3–1.2)
Total Protein: 5.5 g/dL — ABNORMAL LOW (ref 6.5–8.1)

## 2017-10-11 LAB — CBC
HEMATOCRIT: 32.2 % — AB (ref 39.0–52.0)
Hemoglobin: 9.8 g/dL — ABNORMAL LOW (ref 13.0–17.0)
MCH: 27.8 pg (ref 26.0–34.0)
MCHC: 30.4 g/dL (ref 30.0–36.0)
MCV: 91.5 fL (ref 78.0–100.0)
PLATELETS: 221 10*3/uL (ref 150–400)
RBC: 3.52 MIL/uL — ABNORMAL LOW (ref 4.22–5.81)
RDW: 16.3 % — AB (ref 11.5–15.5)
WBC: 10.8 10*3/uL — ABNORMAL HIGH (ref 4.0–10.5)

## 2017-10-11 LAB — GLUCOSE, CAPILLARY
Glucose-Capillary: 123 mg/dL — ABNORMAL HIGH (ref 65–99)
Glucose-Capillary: 352 mg/dL — ABNORMAL HIGH (ref 65–99)

## 2017-10-11 NOTE — Clinical Social Work Placement (Signed)
   CLINICAL SOCIAL WORK PLACEMENT  NOTE  Date:  10/11/2017  Patient Details  Name: [name] MRN: [id number] Date of Birth: 1946/02/07  Clinical Social Work is seeking post-discharge placement for this patient at the [place] level of care (*CSW will initial, date and re-position this form in  chart as items are completed):  Yes   Patient/family provided with [place] Work Department's list of facilities offering this level of care within the geographic area requested by the patient (or if unable, by the patient's family).  Yes   Patient/family informed of their freedom to choose among providers that offer the needed level of care, that participate in Medicare, Medicaid or managed care program needed by the patient, have an available bed and are willing to accept the patient.  Yes   Patient/family informed of [facility]'s ownership interest in [facility] and [facility], as well as of the fact that they are under no obligation to receive care at these facilities.  PASRR submitted to EDS on       PASRR number received on       Existing PASRR number confirmed on 10/09/17     FL2 transmitted to all facilities in geographic area requested by pt/family on 10/09/17     FL2 transmitted to all facilities within larger geographic area on       Patient informed that his/her managed care company has contracts with or will negotiate with certain facilities, including the following:        Yes   Patient/family informed of bed offers received.  Patient chooses bed at [facility]     Physician recommends and patient chooses bed at      Patient to be transferred to [facility] on 10/11/17.  Patient to be transferred to facility by [facility]     Patient family notified on 10/11/17 of transfer.  Name of family member notified:  [name]     PHYSICIAN Please prepare prescriptions     Additional Comment:     _______________________________________________ Candie Chroman, LCSW 10/11/2017, 1:06 PM

## 2017-10-11 NOTE — Clinical Social Work Note (Signed)
CSW facilitated patient discharge including contacting patient family and facility to confirm patient discharge plans. Clinical information faxed to facility and family agreeable with plan. CSW arranged ambulance transport via [facility] to [facility]. RN to call report prior to discharge [phone] Room 322 c-wing).  CSW will sign off for now as social work intervention is no longer needed. Please consult [place] again if new needs arise.  Dayton Scrape, Emma

## 2017-10-11 NOTE — Discharge Instructions (Signed)
Esophageal Spasm An esophageal spasm is a muscle spasm of the tube that connects the back of your throat to your stomach (esophagus). Your esophagus normally moves food and liquids down into your stomach with smooth, wavelike muscle contractions. If you have esophageal spasms, abnormal muscle contractions in your esophagus can be painful and cause you to have difficulty swallowing (dysphagia). There are two types of esophageal spasms. You may have one or both types:  Diffuse esophageal spasms. These are irregular, uncoordinated muscle movements of the esophagus. The diffuse type causes more dysphagia.  Nutcracker esophagus. This is a type of muscle contraction that is coordinated but too strong. The muscles move in a regular order, but the contraction is stronger than necessary. The nutcracker type of esophageal spasm is more painful.  Severe cases of esophageal spasm can make it hard to eat well and do all your usual activities. Esophageal spasms often occur along with severe heartburn (reflux esophagitis). Swallowed foods or liquids may also come back up into your throat (regurgitation). What are the causes? The cause of esophageal spasm is not known. What increases the risk? You may have a higher risk for esophageal spasm if you:  Are male.  Are an older person.  Eat very quickly.  Swallow foods or drinks that are very hot or very cold.  Are depressed or anxious.  What are the signs or symptoms? Signs and symptoms can vary from day to day. They may be mild or severe. They may last for minutes or hours. Common signs and symptoms include:  Chest pain. This may feel like a heart attack.  Back pain.  Dysphagia.  Heartburn.  The feeling that something is stuck in your throat (globus).  Regurgitation of foods or liquids.  How is this diagnosed? Your health care provider may suspect esophageal spasm based on your symptoms and a physical exam. Tests may be done to help confirm  the diagnosis. These may include:  Endoscopy. A flexible telescope is passed into your esophagus.  Barium swallow. An X-ray is done while you drink a substance (contrast material) that shows up on X-ray.  Esophageal manometry. Pressure measurements of the inside of your esophagus are taken while you swallow.  How is this treated? Mild cases of esophageal spasms may not need to be treated. You may be able to manage the spasms by avoiding the foods and eating habits that trigger them. Treatment for spasms that are more severe or frequent can include the following:  You may be given medicine to: ? Relax the esophageal muscles. ? Relieve muscle spasms (calcium channel blockers and nitrates). ? Block nerve endings in the esophagus. This is done with an injection of a certain toxin (botulinum). ? Relieve heartburn (proton pump inhibitors).  Antidepressant medicines are sometimes used to ease symptoms.  For severe cases, surgery is sometimes done to reduce esophageal muscle contractions (myotomy).  Follow these instructions at home:  Take medicines only as directed by your health care provider.  Do not eat foods that trigger heartburn or esophageal spasms.  Eat your meals slowly.  Chew your food completely.  Avoid foods and drinks that are very hot or very cold.  Find ways to manage stress.  Ask for help if you struggle with depression or anxiety.  Keep all follow-up visits as directed by your health care provider. This is important. Contact a health care provider if:  Your symptoms change or get worse.  You are losing weight because of dysphagia.  Your medicine is  not helping your symptoms.  Your esophageal spasms are interfering with your quality of life. Get help right away if:  You have very bad or unusual chest pain.  You have trouble breathing.  You choke. This information is not intended to replace advice given to you by your health care provider. Make sure you  discuss any questions you have with your health care provider. Document Released: 11/02/2002 Document Revised: 01/18/2016 Document Reviewed: 11/05/2013 Elsevier Interactive Patient Education  [employer].

## 2017-10-11 NOTE — Discharge Summary (Signed)
Final DISCHARGE SUMMARY  [name]  MR#: [id number]  DOB:18-Sep-1945  Date of Admission: 09/30/2017 Date of Discharge: 10/11/2017  Attending Physician:Jeffrey Hennie Duos, MD  Patient's GYI:RSWN, Michaelle Copas, MD  Consults: Cardiology  [facility]  Disposition: D/C to SNF   Follow-up Appts:  Contact information for follow-up providers    Laurence Spates, MD. Schedule an appointment as soon as possible for a visit in 2 month(s).   Specialty:  Gastroenterology Why:  Follow-up for abnormal LFTs, anemia and chronic dysphagia  PLEASE  CALL  Contact information: [address] [place] [address] [phone]            Contact information for after-discharge care    Destination    [facility] Follow up.   Service:  Skilled Nursing Contact information: [address] [place] [phone]                 Tests Needing Follow-up: -pt needs to be scheduled for a f/u TTE in the [facility] Cardiology office following his d/c from SNF -deter imine if risk of ongoing use of Eliquis outweighs benefit  -consider outpt sleep study to titrate CPAP   Discharge Diagnoses: SIRS due to aspiration pneumonitis  Esophageal stricture / Esophageal dysmotility NSTEMI Acute on chronic systolic CHF Mild transaminitis HTN  DM2 HLD Dementia  OSA  Initial presentation: 72 yo M w/ a Hxof Seizures, depression, CVA, OSA not on CPAP, HTN, HLD, Dementia, ETOH abuse, DM (diet-controlled), CAD s/p CABG 9381, chronic diastolic CHF, atrial flutter, diverticulitis s/p partial colectomy, hiatal hernia, PE 2005, nephrolithiasis, and C diff colitis requiring hospital admit 1/1 > 09/01/2017 who presented with weakness.  Workup in the ED suggested SIRS v/s sepsis, with no clear source.  Flu negative. CXR and CTa chest  negative. Urine unremarkable.   Hospital Course:  SIRS due to aspiration pneumonitis  completed a seven-day course of  empiric antibiotics - no clear infectious source was identified - stopped abx 2/11 - suspect this represented SIRS in the setting of a significant aspiration pneumonitis   Esophageal stricture / Esophageal dysmotility Initial barium swallow positivefor impacted food/possible esophageal stricture - pt stated he had multiple esophageal dilations in the past and was told he was not a candidate for further dilations due to a high risk of perforation - GI evaluated during this hospitalization - f/u barium esophagram confirmed clearance of the food impaction, and no evidence of a stricture - MBS 10/10/17 further supported severe esophageal dysmotility - cont diet as per SLP recs (D2 thin liuids) - appears to be stabilizing at the time of d/c   NSTEMI Initial EKG with ST depressions concerning for ischemia - Troponin significantly elevated but trended down - Cardiology to consider cardiac catheterization after pt undergoes rehab - Cards suggests we cont ASA at 16m, statin, and BB   Acute on chronic systolic CHF TTE 20/1BPZWCEF 20-25% compared to 08/29/2017 EF 50-55% - lasix dosed per Cards - cont ARB and BB and aldactone - f/u TTE suggested after d/c from rehab to help quide extent of further coronary evaluation   Hx of PE - possible hx of Afib/Aflutter Of note, this patient was NOT on chronic anticoagulation at the time of his admit, nor was he on anticoag at the time of his admit in Jan 2019 - his PE was in 2005, and he has completed his tx - Cards transitioned the pt from IV heparin (being used for NSTEMI)  to [place] (for hx of PE), but the pt no longer requires anticoag for his remote hx of PE - he is at risk for GIB having a hx of same in late 2018 - for now we will continue the eliquis as it does appear to be indicated for paroxysmal Afib/flutter  Mild transaminitis Unclear etiology - ?mild shock liver - nearly resolved at time of d/c - acute hepatitis panel negative   HTN  BP controlled at  this time   DM2 2/5 A1c 7.1 - CBG well controlled   HLD Resumed lipitor  Dementia  continue Aricept, Abilify, Lamictal, and Trazodone - appears clinically to be mild/mod  OSA Not on home CPAP but clinically needs - arranged for DME CPAP at SNF - consider formal sleep study after d/c from SNF for titration of home CPAP settings   Allergies as of 10/11/2017      Reactions   Cephalexin    Unknown   Doxycycline    Unknown   Keflex [cephalexin]    Lac Bovis Other (See Comments)   lactose intolerant   Pentazocine Lactate    He passed out- he had a seizure.  This occurred around 2000   Talwin [pentazocine] Other (See Comments)   hallucinations   Tape Other (See Comments)   Paper tape only please.   Zanaflex [tizanidine Hcl] Other (See Comments)   Lightheaded and dizzy      Medication List    STOP taking these medications   glimepiride 2 MG tablet Commonly known as:  AMARYL   ONE TOUCH ULTRA TEST test strip Generic drug:  glucose blood     TAKE these medications   acetaminophen 325 MG tablet Commonly known as:  TYLENOL Take 2 tablets (650 mg total) by mouth every 4 (four) hours as needed for mild pain, fever or headache. What changed:    when to take this  reasons to take this   apixaban 5 MG Tabs tablet Commonly known as:  ELIQUIS Take 1 tablet (5 mg total) by mouth 2 (two) times daily.   ARIPiprazole 5 MG tablet Commonly known as:  ABILIFY Take 1 tablet (5 mg total) by mouth daily.   aspirin 81 MG EC tablet Take 1 tablet (81 mg total) by mouth daily. What changed:    medication strength  how much to take   atorvastatin 40 MG tablet Commonly known as:  LIPITOR Take 1 tablet (40 mg total) by mouth daily at 6 PM. What changed:    medication strength  how much to take  when to take this   camphor-menthol lotion Commonly known as:  SARNA Apply 1 application topically daily. To entire body   carvedilol 3.125 MG tablet Commonly known as:   COREG Take 1 tablet (3.125 mg total) by mouth 2 (two) times daily with a meal.   donepezil 10 MG tablet Commonly known as:  ARICEPT Take 1 tablet (10 mg total) by mouth daily.   Fluocinolone Acetonide 0.01 % Oil Apply 1 application topically daily as needed (itching). Apply to scalp   FLUoxetine 40 MG capsule Commonly known as:  PROZAC Take 1 capsule (40 mg total) by mouth daily. What changed:  medication strength   fluticasone 50 MCG/ACT nasal spray Commonly known as:  FLONASE Place 1 spray into both nostrils 2 (two) times daily as needed for rhinitis.   furosemide 80 MG tablet Commonly known as:  LASIX Take 1 tablet (80 mg total) by mouth 2 (two) times daily. What changed:  medication strength  See the new instructions.   GENTEAL TEARS OP Apply 1 drop to eye as needed (dry eyes).   insulin aspart 100 UNIT/ML injection Commonly known as:  novoLOG Inject 0-15 Units into the skin 3 (three) times daily with meals.   insulin aspart 100 UNIT/ML injection Commonly known as:  novoLOG Inject 0-5 Units into the skin at bedtime.   lamoTRIgine 100 MG tablet Commonly known as:  LAMICTAL Take 1 tablet (100 mg total) by mouth 2 (two) times daily. What changed:  medication strength   losartan 25 MG tablet Commonly known as:  COZAAR Take 1 tablet (25 mg total) by mouth daily.   multivitamin capsule Take 1 capsule by mouth daily.   nitroGLYCERIN 0.4 MG SL tablet Commonly known as:  NITROSTAT Place 1 tablet (0.4 mg total) under the tongue every 5 (five) minutes as needed (as needed for chest pain or with symptoms of esophageal spasm). What changed:    reasons to take this  additional instructions   OXYGEN Inhale 2.5 L into the lungs.   potassium citrate 10 MEQ (1080 MG) SR tablet Commonly known as:  UROCIT-K Take 1 tablet (10 mEq total) by mouth 2 (two) times daily.   spironolactone 25 MG tablet Commonly known as:  ALDACTONE Take 1 tablet (25 mg total) by mouth  daily.   traZODone 100 MG tablet Commonly known as:  DESYREL Take 1 tablet (100 mg total) by mouth at bedtime.   triamcinolone 0.1 % cream : eucerin Crea Apply 1 application topically daily. TRIAMCINOLONE 1%/ Absorbase            Durable Medical Equipment  (From admission, onward)        Start     Ordered   10/10/17 1453  For home use only DME continuous positive airway pressure (CPAP)  Once    Comments:  Full face mask - extra large size  Question Answer Comment  Patient has OSA or probable OSA Yes   Is the patient currently using CPAP in the home No   Date of face to face encounter 10/07/2017   Settings 5-10   Signs and symptoms of probable OSA  (select all that apply) Witnessed apneas   CPAP supplies needed Mask, headgear, cushions, filters, heated tubing and water chamber      10/10/17 1453      Day of Discharge BP 108/61   Pulse 72   Temp 98.7 F (37.1 C) (Oral)   Resp (!) 23   Ht 5' 7"  (1.702 m)   Wt 96 kg (211 lb 10.3 oz)   SpO2 92%   BMI 33.15 kg/m   Physical Exam: General: No acute respiratory distress - alert and conversnat  Lungs: mild bibasilar crackles - no wheezing  Cardiovascular: RRR - no gallup or rub  Abdomen: NT/ND, soft, overweight, bs+ Extremities: trace edema bilateral lower extremities  Basic Metabolic Panel: Recent Labs  Lab 10/05/17 0426 10/06/17 0335  10/08/17 0313 10/08/17 2301 10/09/17 0421 10/10/17 0410 10/11/17 0253  NA 135 136   < > 137 131* 136 139 138  K 4.4 4.2   < > 3.8 3.7 3.8 3.5 3.6  CL 102 102   < > 101 96* 96* 98* 97*  CO2 23 23   < > 26 22 27 28 28   GLUCOSE 86 93   < > 90 101* 102* 108* 117*  BUN 27* 24*   < > 15 11 11 7 9   CREATININE 0.92 0.91   < >  0.72 0.62 0.70 0.69 0.86  CALCIUM 7.7* 7.7*   < > 7.8* 7.6* 8.0* 8.1* 8.1*  MG 2.4 2.4  --   --   --   --   --   --    < > = values in this interval not displayed.    Liver Function Tests: Recent Labs  Lab 10/07/17 0305 10/08/17 0313 10/11/17 0253    AST 209* 116* 33  ALT 393* 290* 115*  ALKPHOS 301* 251* 183*  BILITOT 0.7 0.8 0.7  PROT 5.2* 5.4* 5.5*  ALBUMIN 2.3* 2.2* 2.4*   Coags: Recent Labs  Lab 10/07/17 0305  INR 1.36   CBC: Recent Labs  Lab 10/05/17 0426 10/06/17 0335 10/07/17 0305 10/08/17 2301 10/11/17 0253  WBC 16.5* 13.1* 12.1* 10.8* 10.8*  NEUTROABS  --   --   --  7.6  --   HGB 9.7* 9.6* 9.9* 9.8* 9.8*  HCT 31.7* 31.5* 32.6* 31.4* 32.2*  MCV 93.2 94.3 93.1 91.8 91.5  PLT 124* 120* 129* 177 221    Cardiac Enzymes: Recent Labs  Lab 10/05/17 0426  TROPONINI 1.96*   BNP (last 3 results) Recent Labs    08/26/17 2051  BNP 533.3*    CBG: Recent Labs  Lab 10/10/17 0520 10/10/17 1133 10/10/17 1619 10/10/17 2056 10/11/17 0549  GLUCAP 107* 154* 102* 141* 123*    Time spent in discharge (includes decision making & examination of pt): 35 minutes  10/11/2017, 11:48 AM   Cherene Altes, MD [facility] Office  [phone] Pager [phone]  On-Call/Text Page:      [email or web address]      password [facility]

## 2017-10-14 DIAGNOSIS — J690 Pneumonitis due to inhalation of food and vomit: Secondary | ICD-10-CM | POA: Diagnosis not present

## 2017-10-14 DIAGNOSIS — R5381 Other malaise: Secondary | ICD-10-CM | POA: Diagnosis not present

## 2017-10-14 DIAGNOSIS — R6510 Systemic inflammatory response syndrome (SIRS) of non-infectious origin without acute organ dysfunction: Secondary | ICD-10-CM | POA: Diagnosis not present

## 2017-10-14 DIAGNOSIS — K224 Dyskinesia of esophagus: Secondary | ICD-10-CM | POA: Diagnosis not present

## 2017-10-16 DIAGNOSIS — L293 Anogenital pruritus, unspecified: Secondary | ICD-10-CM | POA: Diagnosis not present

## 2017-10-16 DIAGNOSIS — K5289 Other specified noninfective gastroenteritis and colitis: Secondary | ICD-10-CM | POA: Diagnosis not present

## 2017-10-21 DIAGNOSIS — K210 Gastro-esophageal reflux disease with esophagitis: Secondary | ICD-10-CM | POA: Diagnosis not present

## 2017-10-21 DIAGNOSIS — K224 Dyskinesia of esophagus: Secondary | ICD-10-CM | POA: Diagnosis not present

## 2017-10-21 DIAGNOSIS — R5381 Other malaise: Secondary | ICD-10-CM | POA: Diagnosis not present

## 2017-10-28 DIAGNOSIS — G4709 Other insomnia: Secondary | ICD-10-CM | POA: Diagnosis not present

## 2017-10-28 DIAGNOSIS — R9389 Abnormal findings on diagnostic imaging of other specified body structures: Secondary | ICD-10-CM | POA: Diagnosis not present

## 2017-11-04 DIAGNOSIS — I5033 Acute on chronic diastolic (congestive) heart failure: Secondary | ICD-10-CM | POA: Diagnosis not present

## 2017-11-12 ENCOUNTER — Ambulatory Visit (INDEPENDENT_AMBULATORY_CARE_PROVIDER_SITE_OTHER): Payer: Medicare Other | Admitting: Adult Health

## 2017-11-12 ENCOUNTER — Encounter: Payer: Self-pay | Admitting: Adult Health

## 2017-11-12 VITALS — BP 102/80 | HR 87

## 2017-11-12 DIAGNOSIS — I63421 Cerebral infarction due to embolism of right anterior cerebral artery: Secondary | ICD-10-CM | POA: Diagnosis not present

## 2017-11-12 NOTE — Patient Instructions (Signed)
Continue aspirin 81 mg daily and Eliquis (apixaban) daily  and lipitor  for secondary stroke prevention  Schedule appointment with cardiologist for repeat echocardiogram   Continue Lamictal for seizure prevention  Continue aricept for memory  Call if you need assistance with PT/OT/SLP   Maintain strict control of hypertension with blood pressure goal below 130/90, diabetes with hemoglobin A1c goal below 6.5% and cholesterol with LDL cholesterol (bad cholesterol) goal below 70 mg/dL. I also advised the patient to eat a healthy diet with plenty of whole grains, cereals, fruits and vegetables, exercise regularly and maintain ideal body weight.  Followup in the future with me in 6 months

## 2017-11-12 NOTE — Progress Notes (Signed)
STROKE NEUROLOGY FOLLOW UP NOTE  NAME: [name] DOB: [birth year]  REASON FOR VISIT: stroke follow up HISTORY FROM: wife and chart  Today we had the pleasure of seeing [name] in follow-up at our Neurology Clinic. Pt was accompanied by wife.   History Summary [name] is a 72 yo male with PMH of CAD s/p CABG in 2005, HTN, DM, HLD, OSA on CPAP, seizure disorder on keppra in the past, chronic diastolic CHF on chronic home O2 was admitted on 11/14/13 for staring spell and left leg weakness and falling. MRI showed acute right ACA stroke. MRA showed no large vessel stenosis and carotid Doppler were unremarkable. Stroke workup including TTE and TEE were negative, and he had loop recorder placed. He was put on aspirin for stroke prevention.   Subsequent loop recording did not show afib, and patient was enrolled to RESPECT ESUS study. However, he was taken off the trial in 05/2014 due to PE which required anticoagulation with coumadin. EMG study in 04/2014 showed right S1 radiculopathy with some involvement at L3 or L4 level. He continued to follow up in our clinic for stroke and loop recorder did not show afib. 03/2014 CTA head and neck showed intracranial stenosis with 70% right supraclinoid ICA stenosis.  On 02/11/15, he had episode of staring again, not responding to wife, EMS called, on arrival pt staring resolved. After EMS left, pt had send episode of staring and not responding, wife called EMS back, and pt was sent to [facility]. In ED, his staring seemed resolved but still has speech difficulty. INR 2.07 on admission. 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 His coumadin changed to eliquis and continued on ASA for cardiac prevention. A1C 7.1 and lipid panel showed high TG and LDL not able to be calculated. He was continued on lipitor and also put on Tricor.   In 02/2015, pt had another episode of staring spell and  resolved in 5-10min, he was seen in ER and complain of dizziness and was told to have vertigo. MRI done showed similar findings as in 01/2015, concerning for either blood product from prior infarct or laminar necrosis. He and was told to have vertigo was continued on eliquis.  Follow up 07/10/15 - the patient has been doing the same. No more staring spells. Continued on eliquis and ASA without side effects. Loop recorder did not show afib or aflutter so far. He is still on chronic home O2. As per wife, pt had seizure disorder in the past, not sure about EEG findings but was put on keppra for a while. Pt still has LLE weakness and currently walk with walker. BP 101/68 in clinic.    Follow-up 09/19/15 - he has been doing well from stroke standpoint. No stroke recurrence or seizure like activity. Had EEG done showed normal EEG. Pt not able to tolerate Keppra which was then discontinued. Switched to lamictal with slow titration. Currently on 61m bid. Glucose not in good control at home but BP is under control. Today BP in clinic 127/74. As per wife, pt has insomnia with frequent urination at hight, intermittent urinary incontinence. Pt has appointment with urology this week.   Follow up 12/23/16: During the interval time, patient has been followed by Mr. Department, continued on eliquis and Lamictal. About 2 months ago, wife was taking respite time for 10 days and pt was taken care of by NH at that  time. As per wife, pt was given overdosed trazodone due to confusion of dosage, as well as no O2 for CPAP at night. He was not active during the day and he lost much ability to walk when wife came back from respite service. He got PT/OT from [place] service later but still in wheelchair now and not able to walk with walker well as before. BP today 117/64.  UPDATE 11/12/17: recent hospitalization on 09/30/2017 due to SIRS due to aspiration pneumoniaitis. He completed a 7-day course of empiric antibiotics but there was no clear  infectious source and antibiotics were stopped on 2/11.  During his admission, he had a TTE completed which showed an EF of 20-25% compared to 08/29/2017 EF 50-55%.  After medication management, it was suggested that patient have a follow-up TTE completed after rehab stay at [facility].  Patient comes in today for a one-year follow-up and is accompanied by his wife.  Before his appointment today, they did admit him into [place].  Patient continues to have ambulation problems, memory loss, and incontinence of bladder.  Patient needs full care with all ADLs.  Patient does become dyspneic on exertion so he is using wheelchair at this time.  Per the wife, he does use a rolling walker for short ambulation distances.  He has fallen multiple times in rehab without injury.  Blood pressure today satisfactory 102/80.  Continues to take Eliquis and aspirin without increase in bleeding or bruising.  Continues to take Lipitor without side effects of myalgias.  Dementia has been stable on Aricept 10 mg.  Currently using CPAP for OSA.  Continues to take Lamictal and denies recent seizure activity.   REVIEW OF SYSTEMS: Full 14 system review of systems performed and notable only for those listed below and in HPI above, all others are negative:  Appetite change, fatigue, hearing loss, ringing in ears, trouble swallowing, eye discharge, eye itchiness, eye redness, shortness of breath, diarrhea, incontinence of bowels, apnea, incontinence of bladder, back pain, walking difficulty, wounds, itching, memory loss, speech difficulty, facial drooping, agitation, confusion, and depression  The following represents the patient's updated allergies and side effects list: Allergies  Allergen Reactions  . Cephalexin     Unknown  . Doxycycline     Unknown  . Keflex [Cephalexin]   . Lac Bovis Other (See Comments)    lactose intolerant  . Pentazocine Lactate     He passed out- he had a seizure.  This  occurred around 2000  . Talwin [Pentazocine] Other (See Comments)    hallucinations  . Tape Other (See Comments)    Paper tape only please.  . Zanaflex [Tizanidine Hcl] Other (See Comments)    Lightheaded and dizzy    The neurologically relevant items on the patient's problem list were reviewed on today's visit.  Neurologic Examination  A problem focused neurological exam (12 or more points of the single system neurologic examination, vital signs counts as 1 point, cranial nerves count for 8 points) was performed.  Blood pressure 102/80, pulse 87.  General - Well nourished, elderly caucasian male, well developed, in no apparent distress, flat affect  Ophthalmologic - Fundi not visualized due to noncooperation.  Cardiovascular - Irregular rate and rhythm.  Mental Status -  Level of arousal and orientation to person, not orientated to time or place. Mild Dysarthria present.  Language including expression, naming, repetition, comprehension was assessed and found intact. Fund of Knowledge was assessed and was impaired.  Cranial  Nerves II - XII - II - Visual field intact OU. III, IV, VI - Extraocular movements intact. V - Facial sensation intact bilaterally. VII - Facial movement decreased bilaterally, with drooling, but able to smile, symmetric. VIII - Hearing & vestibular intact bilaterally. X - Palate elevates symmetrically. XI - Chin turning & shoulder shrug intact bilaterally. XII - Tongue protrusion intact.  Motor Strength - The patient's strength was 5/5 in all extremities but weak bilateral hip flexors and pronator drift was absent.  Bulk was normal and fasciculations were absent.   Motor Tone - Muscle tone was assessed at the neck and appendages and was normal.  Reflexes - The patient's reflexes were 1+ in all extremities and he had no pathological reflexes.  Sensory - Light touch, temperature/pinprick were assessed and were normal.    Coordination - Decreased  dexterity in bilateral upper extremities.  Resting and action tremor was absent.  Gait and Station - in wheelchair, not tested due to [employer] reviewed: I personally reviewed the images and agree with the radiology interpretations.  MRI and MRA 11/14/13 - Subacute infarction in the right frontal lobe. Swelling, laminar necrosis and low level blood product deposition. No frank hematoma. Older right frontal cortical infarction adjacent to that. Chronic small vessel changes elsewhere throughout the brain. Distal vessel atherosclerotic changes diffusely. The right anterior cerebral artery is patent but is smaller than the left.  MRI and MRA 02/11/15 Chronic right ACA infarct in the right frontal lobe. There is now a small area of acute infarct in the right frontal cortex over the convexity compatible with extension of infarction. Atrophy and chronic microvascular ischemic changes. Chronic infarct left pons has developed since 2015. Severe stenosis left posterior cerebral artery is unchanged from the prior study. Right anterior cerebral artery is widely patent.  MRI 03/07/15 No significant change from 02/11/2015. Chronic hemorrhagic infarct right frontal lobe with area of restricted diffusion compatible with acute or subacute infarct along the superior anterior margin of the chronic infarct. Atrophy and chronic microvascular ischemia.  MRI 08/04/15 Abnormal MRI scan of the brain showing remote age large left frontal infarcts with encephalomalacia, gliosis, laminar necrosis as well as small left pontine remote age lacunar infarct. Persistent diffusion-positive signal in the right frontal cortex likely represents laminar necrosis as compared with previous MRI scan dated 02/11/2015 no significant changes are noted  CTA head and neck 04/01/2014 -  1. Right ACA occlusion in the distal A2 segment just beyond the frontopolar artery. Expected evolution of the right ACA infarct which occurred in  March. 2. Severe right ICA supraclinoid segment atherosclerotic stenosis, numerically estimated at 70%. 3. Additional bilateral ICA siphon calcified plaque, and bilateral cervical carotid calcified plaque, without additional hemodynamically significant stenosis. 4. Moderate stenosis at the left vertebral artery origin. Mild vertebral plaque otherwise.  CUS 02/13/15 -  - Technically difficult due to respiratory interference and high  bifurcations - Bilateral - 1% to 39% ICA stenosis. Vertebral artery flow is  antegrade.  TTE 12/02/14  - Left ventricle: The cavity size was normal. Wall thickness was normal. Systolic function was normal. The estimated ejection fraction was in the range of 55% to 60%. Doppler parameters are consistent with abnormal left ventricular relaxation (grade 1 diastolic dysfunction). - Mitral valve: Calcified annulus. Mildly thickened leaflets . - Pulmonary arteries: PA peak pressure: 34 mm Hg (S).  Nuclear stress test 04/25/15  The left ventricular ejection fraction is hyperdynamic (>65%).  Nuclear stress EF: 71%.  There was no  ST segment deviation noted during stress.  This is a low risk study.  Loop recorder - no afib so far  EMG/NCS 05/10/14 - Nerve conduction studies done on both lower extremities were unremarkable, without evidence of a peripheral neuropathy. EMG evaluation of the right lower extremity shows findings that are most consistent with a low-grade acute S1 radiculopathy, with some involvement as well at the L3 or L4 level. EMG evaluation of the left lower extremity does not show clear evidence of a lumbosacral radiculopathy.  EEG 08/23/15 - Normal EEG in the awake state.   CT HEAD 09/30/17 Atrophy with prior infarcts and areas of small vessel disease, essentially stable. Largest prior infarct involves the right frontal lobe. No acute infarct evident. No mass or hemorrhage.  2D Echo 10/01/17 - Left ventricle: The cavity size was  normal. Wall thickness was   increased in a pattern of mild LVH. Systolic function was   severely reduced. The estimated ejection fraction was in the   range of 20% to 25%. - Aortic valve: Moderately calcified annulus. Moderately thickened,   moderately calcified leaflets. There was trivial regurgitation. - Mitral valve: There was mild to moderate regurgitation. - Tricuspid valve: There was moderate regurgitation. - Pulmonary arteries: Systolic pressure was moderately increased.   PA peak pressure: 42 mm Hg (S).     Assessment: As you may recall, he is a 72 y.o. Caucasian male with PMH of CAD s/p CABG in 2005, HTN, DM, HLD, OSA on CPAP, seizure disorder on keppra in the past, chronic diastolic CHF on chronic home O2 was admitted on 11/14/13 for acute right ACA stroke. Continues lamictal for seizure control. Recent hospital admission for SIRS from aspiration pneumonia and d/c'd to [facility]. Transitioned to [facility].    Plan:  -Continue aspirin 81 mg daily and Eliquis (apixaban) daily  and lipitor  for secondary stroke prevention -Schedule appointment with cardiologist for repeat echocardiogram  -Continue Lamictal for seizure prevention -Continue aricept for memory as wife believes his memory is stable on this dose/medication -will place order for additional PT/OT/SLP if needed -Maintain strict control of hypertension with blood pressure goal below 130/90, diabetes with hemoglobin A1c goal below 6.5% and cholesterol with LDL cholesterol (bad cholesterol) goal below 70 mg/dL. I also advised the patient to eat a healthy diet with plenty of whole grains, cereals, fruits and vegetables, exercise regularly and maintain ideal body weight.  Followup in the future with me in 6 months  Greater than 50% time during this 25 minute consultation visit was spent on counseling and coordination of care about HTN, HLD, memory and seizures, discussion about risk benefit of anticoagulation and  answering questions.    Venancio Poisson, AGNP-BC  [facility] [address] [place], [place] [address]  Phone [phone] Fax [phone]

## 2017-11-13 ENCOUNTER — Telehealth: Payer: Self-pay

## 2017-11-13 NOTE — Telephone Encounter (Signed)
DNR fax twice and confirmed. Rn has original kept in her file cabinet if they need to have the original one.

## 2017-11-13 NOTE — Telephone Encounter (Signed)
Rn call [facility] at [phone]. Rn spoke with [place] and stated the original dnr was left with our medical provider. NIkita requested it be fax to [phone]. Rn stated the original copy will be kept at [facility] in case they need to pick it up.

## 2017-11-14 NOTE — Progress Notes (Signed)
I reviewed above note and agree with the assessment and plan. Please make sure he has appointment with cardiology. Not sure if he still sees Dr. Caryl Comes (last seen in 2017) or he follows with [facility]. So far no cardiology appointment in chart. Thanks for taking care of him.   Rosalin Hawking, MD PhD Stroke Neurology 11/14/2017 11:32 AM

## 2017-11-20 DIAGNOSIS — G4733 Obstructive sleep apnea (adult) (pediatric): Secondary | ICD-10-CM | POA: Diagnosis not present

## 2017-11-20 DIAGNOSIS — Z86711 Personal history of pulmonary embolism: Secondary | ICD-10-CM | POA: Diagnosis not present

## 2017-11-20 DIAGNOSIS — J449 Chronic obstructive pulmonary disease, unspecified: Secondary | ICD-10-CM | POA: Diagnosis not present

## 2017-11-20 DIAGNOSIS — R945 Abnormal results of liver function studies: Secondary | ICD-10-CM | POA: Diagnosis not present

## 2017-11-20 DIAGNOSIS — K219 Gastro-esophageal reflux disease without esophagitis: Secondary | ICD-10-CM | POA: Diagnosis not present

## 2017-11-20 DIAGNOSIS — K912 Postsurgical malabsorption, not elsewhere classified: Secondary | ICD-10-CM | POA: Diagnosis not present

## 2017-11-20 DIAGNOSIS — K224 Dyskinesia of esophagus: Secondary | ICD-10-CM | POA: Diagnosis not present

## 2017-11-20 DIAGNOSIS — R197 Diarrhea, unspecified: Secondary | ICD-10-CM | POA: Diagnosis not present

## 2017-11-20 DIAGNOSIS — A0472 Enterocolitis due to Clostridium difficile, not specified as recurrent: Secondary | ICD-10-CM | POA: Diagnosis not present

## 2017-11-20 DIAGNOSIS — Z8673 Personal history of transient ischemic attack (TIA), and cerebral infarction without residual deficits: Secondary | ICD-10-CM | POA: Diagnosis not present

## 2017-11-20 DIAGNOSIS — G309 Alzheimer's disease, unspecified: Secondary | ICD-10-CM | POA: Diagnosis not present

## 2017-11-25 ENCOUNTER — Ambulatory Visit: Payer: Medicare Other | Admitting: Adult Health

## 2017-11-27 ENCOUNTER — Ambulatory Visit (INDEPENDENT_AMBULATORY_CARE_PROVIDER_SITE_OTHER): Payer: Medicare Other | Admitting: Physician Assistant

## 2017-11-27 ENCOUNTER — Encounter: Payer: Self-pay | Admitting: Physician Assistant

## 2017-11-27 VITALS — BP 106/65 | HR 90 | Wt 190.0 lb

## 2017-11-27 DIAGNOSIS — G4733 Obstructive sleep apnea (adult) (pediatric): Secondary | ICD-10-CM

## 2017-11-27 DIAGNOSIS — Z8673 Personal history of transient ischemic attack (TIA), and cerebral infarction without residual deficits: Secondary | ICD-10-CM

## 2017-11-27 DIAGNOSIS — I519 Heart disease, unspecified: Secondary | ICD-10-CM | POA: Diagnosis not present

## 2017-11-27 DIAGNOSIS — I2699 Other pulmonary embolism without acute cor pulmonale: Secondary | ICD-10-CM

## 2017-11-27 DIAGNOSIS — D649 Anemia, unspecified: Secondary | ICD-10-CM | POA: Diagnosis not present

## 2017-11-27 DIAGNOSIS — I10 Essential (primary) hypertension: Secondary | ICD-10-CM | POA: Diagnosis not present

## 2017-11-27 DIAGNOSIS — R0689 Other abnormalities of breathing: Secondary | ICD-10-CM

## 2017-11-27 DIAGNOSIS — I5022 Chronic systolic (congestive) heart failure: Secondary | ICD-10-CM

## 2017-11-27 DIAGNOSIS — Z9989 Dependence on other enabling machines and devices: Secondary | ICD-10-CM | POA: Diagnosis not present

## 2017-11-27 DIAGNOSIS — I25810 Atherosclerosis of coronary artery bypass graft(s) without angina pectoris: Secondary | ICD-10-CM

## 2017-11-27 DIAGNOSIS — E785 Hyperlipidemia, unspecified: Secondary | ICD-10-CM | POA: Diagnosis not present

## 2017-11-27 DIAGNOSIS — R29898 Other symptoms and signs involving the musculoskeletal system: Secondary | ICD-10-CM | POA: Diagnosis not present

## 2017-11-27 NOTE — Progress Notes (Signed)
Cardiology Office Note    Date:  11/28/2017   ID:  [name], [name] 04-10-1946, MRN [id number]  PCP:  Bernell List, MD  Cardiologist: Dr. Martinique Electrophysiologist: Dr. Caryl Comes  Chief Complaint  Patient presents with  . Hospitalization Follow-up    seen for Dr. Martinique. LV dysfunction    History of Present Illness:  [name] is a 72 y.o. male with PMH of HTN, HLD, PE in 05/2014 on eliquis, OSA on CPAP, CAD s/p CABG (LIMA to LAD, SVG to diagonal, SVG to left circumflex marginal branch, SVG to RCA on 12/21/2003), chronic diastolic HF, and h/o cryptogenic stroke with loop recorder, chronic home O2 for possible asbestos toxicity.  He had a PDA with a right internal carotid stenosis of 70% followed by neurology and a history of seizure.  Echocardiogram in September 2015 showed normal LV function.  He had a low risk Myoview in 2016.  Last echocardiogram obtained in January 2019 showed EF 50-55% grade 1 DD, mild MR.  He was admitted in January with heart failure, and was ruled in for an STEMI which was felt to be demand ischemia, troponin peaked at 0.61.  He underwent diuresis and had AKI however renal function improved prior to discharge.  He was in rehab in [place] and had a C. difficile infection.  Most recently, patient presented with severe weakness.  He was ruled in for NSTEMI was a troponin of 13.  He was treated with IV heparin.  Hospitalist manage the patient for sepsis, cardiology was consulted on the side.  Given lack of chest discomfort, cardiology did not immediately proceed with invasive study.  Limited echo obtained on 10/01/2017 showed EF 20-25%, moderate MR, moderate TR, PA peak pressure 42 mmHg.  Further ischemic workup was held off until clinical improvement.  The decision was made to potentially consider cardiac catheterization once he improved physically.  And if he does not improve physically, may consider medical therapy instead.  He was placed on carvedilol and Cozaar, Lasix  increased to 40 mg twice daily and continued on spironolactone. Dr. Sallyanne Kuster recommended on 10/10/2017 to repeat echocardiogram as outpatient to see if there is any improvement in the ejection fraction and would only consider invasive coronary angiography if EF still shows substantial LV dysfunction.  Patient presents today for cardiology office visit.  According to his wife, after his stroke 4 years ago, he did have left-sided weakness, however after physical therapy, he was able to do majority of ADLs at home and he was able to take care of himself.  His left-sided weakness was largely resolved prior to this year.  However after multiple admissions since January, he is essentially wheelchair-bound with only occasional walk to the bathroom.  He is laying in bed for majority of the day.  He has severe deconditioning.  He will require physical therapy and occupational therapy before we can see any noticeable improvement.  He would not be a good candidate for any invasive study at this time.  I will obtain a repeat limited echocardiogram as outpatient to evaluate his ejection fraction.  Even if his ejection fraction does not improve, I do not think he would be able to undergo a cardiac catheterization at this time.  Right now he is on very low-dose of carvedilol, losartan and spironolactone, I am unable to uptitrate his heart failure regimen.  Although he was discharged on 80 mg twice daily of Lasix, he is currently on 60 mg twice daily of Lasix at  [facility].  He appears to be euvolemic on this dose of diuretic, I will continue him on 60 mg twice daily of Lasix.  He will need a basic metabolic panel and CBC today.   Past Medical History:  Diagnosis Date  . AKI (acute kidney injury) ([place])    a. 6/14: resolved after d/c ARB;  b. RA U/S 7/14: no RA stenosis  . Arthritis    of spine  . Atrial flutter ([place])    a. 2008 - one episode in setting of sepsis/shock.  . Back pain    persistent  . Coronary  artery disease    a. s/p CABG 2005. b. low risk nuc 2016.  Marland Kitchen Depression   . Diabetes mellitus    controled by diet  . Diverticulitis    a. s/p partial colectomy remotely.  Marland Kitchen GERD (gastroesophageal reflux disease)   . H/O alcohol abuse   . Headache(784.0)   . Hearing loss   . History of hiatal hernia   . Hx of echocardiogram    Echo 7/14:  Mod LVH, EF 55-60%, Gr 1 DD, mild MR, PASP 36  . Hyperlipidemia   . Hypertension   . Pulmonary embolism ([place]) 2005  . Renal stone 04/15/2014  . Seizures ([place])   . Sleep apnea    does not use CPAP  . Stroke [facility])     Past Surgical History:  Procedure Laterality Date  . APPENDECTOMY    . BACK SURGERY    . CARDIAC CATHETERIZATION    . CERVICAL FUSION    . CORONARY ARTERY BYPASS GRAFT  2005   LIMA graft to LAD,saphenous vein graft to diag.,circumflex, marginal,and to the RCA  . CYSTOSCOPY/RETROGRADE/URETEROSCOPY/STONE EXTRACTION WITH BASKET Left 04/16/2014   Procedure: CYSTOSCOPY/RETROGRADE/LEFT URETEROSCOPY/STONE EXTRACTION WITH BASKET AND LASER LITHOTRIPSY;  Surgeon: Malka So, MD;  Location: [facility];  Service: Urology;  Laterality: Left;  With STENT  . ESOPHAGOGASTRODUODENOSCOPY  02/10/2012   Procedure: ESOPHAGOGASTRODUODENOSCOPY (EGD);  Surgeon: Cleotis Nipper, MD;  Location: [facility] ENDOSCOPY;  Service: Endoscopy;  Laterality: N/A;  . ESOPHAGOGASTRODUODENOSCOPY  06/29/2012   Procedure: ESOPHAGOGASTRODUODENOSCOPY (EGD);  Surgeon: Winfield Cunas., MD;  Location: [facility];  Service: Endoscopy;  Laterality: N/A;  . FOREIGN BODY REMOVAL  02/10/2012   Procedure: FOREIGN BODY REMOVAL;  Surgeon: Cleotis Nipper, MD;  Location: [place];  Service: Endoscopy;  Laterality: N/A;  . FOREIGN BODY REMOVAL  06/29/2012   Procedure: FOREIGN BODY REMOVAL;  Surgeon: Winfield Cunas., MD;  Location: [facility] ENDOSCOPY;  Service: Endoscopy;  Laterality: N/A;  . LEFT HEART CATHETERIZATION WITH CORONARY ANGIOGRAM N/A 04/02/2012   Procedure: LEFT HEART  CATHETERIZATION WITH CORONARY ANGIOGRAM;  Surgeon: Sinclair Grooms, MD;  Location: [facility] CATH LAB;  Service: Cardiovascular;  Laterality: N/A;  . LOOP RECORDER IMPLANT N/A 11/17/2013   Procedure: LOOP RECORDER IMPLANT;  Surgeon: Deboraha Sprang, MD;  Location: [facility] CATH LAB;  Service: Cardiovascular;  Laterality: N/A;  . ORCHIECTOMY    . PARTIAL COLECTOMY     for diverticuli  . SAVORY DILATION  06/29/2012   Procedure: SAVORY DILATION;  Surgeon: Winfield Cunas., MD;  Location: [facility];  Service: Endoscopy;  Laterality: N/A;  . TEE WITHOUT CARDIOVERSION N/A 11/17/2013   Procedure: TRANSESOPHAGEAL ECHOCARDIOGRAM (TEE);  Surgeon: Larey [facility]er, MD;  Location: [place];  Service: Cardiovascular;  Laterality: N/A;  . UMBILICAL HERNIA REPAIR      Current Medications: Outpatient Medications Prior to Visit  Medication Sig  Dispense Refill  . furosemide (LASIX) 20 MG tablet Take 20 mg by mouth 2 (two) times daily.    . furosemide (LASIX) 40 MG tablet Take 40 mg by mouth 2 (two) times daily.    . Melatonin 3 MG TABS Take by mouth.    Marland Kitchen acetaminophen (TYLENOL) 325 MG tablet Take 2 tablets (650 mg total) by mouth every 4 (four) hours as needed for mild pain, fever or headache.    Marland Kitchen apixaban (ELIQUIS) 5 MG TABS tablet Take 1 tablet (5 mg total) by mouth 2 (two) times daily. 60 tablet   . ARIPiprazole (ABILIFY) 5 MG tablet Take 1 tablet (5 mg total) by mouth daily.    . Artificial Tear Solution (GENTEAL TEARS OP) Apply 1 drop to eye as needed (dry eyes).    Marland Kitchen aspirin EC 81 MG EC tablet Take 1 tablet (81 mg total) by mouth daily.    Marland Kitchen atorvastatin (LIPITOR) 40 MG tablet Take 1 tablet (40 mg total) by mouth daily at 6 PM.    . camphor-menthol (SARNA) lotion Apply 1 application topically daily. To entire body    . carvedilol (COREG) 3.125 MG tablet Take 1 tablet (3.125 mg total) by mouth 2 (two) times daily with a meal.    . donepezil (ARICEPT) 10 MG tablet Take 1 tablet (10 mg total) by mouth daily.     . Fluocinolone Acetonide 0.01 % OIL Apply 1 application topically daily as needed (itching). Apply to scalp    . FLUoxetine (PROZAC) 40 MG capsule Take 1 capsule (40 mg total) by mouth daily.  3  . fluticasone (FLONASE) 50 MCG/ACT nasal spray Place 1 spray into both nostrils 2 (two) times daily as needed for rhinitis.  2  . insulin aspart (NOVOLOG) 100 UNIT/ML injection Inject 0-15 Units into the skin 3 (three) times daily with meals. 10 mL 11  . lamoTRIgine (LAMICTAL) 100 MG tablet Take 1 tablet (100 mg total) by mouth 2 (two) times daily.    Marland Kitchen losartan (COZAAR) 25 MG tablet Take 1 tablet (25 mg total) by mouth daily.    . Multiple Vitamin (MULTIVITAMIN) capsule Take 1 capsule by mouth daily.    . nitroGLYCERIN (NITROSTAT) 0.4 MG SL tablet Place 1 tablet (0.4 mg total) under the tongue every 5 (five) minutes as needed (as needed for chest pain or with symptoms of esophageal spasm).  12  . OXYGEN Inhale 2.5 L into the lungs.    . potassium citrate (UROCIT-K) 10 MEQ (1080 MG) SR tablet Take 1 tablet (10 mEq total) by mouth 2 (two) times daily.    Marland Kitchen spironolactone (ALDACTONE) 25 MG tablet Take 1 tablet (25 mg total) by mouth daily.    . traZODone (DESYREL) 100 MG tablet Take 1 tablet (100 mg total) by mouth at bedtime.    . Triamcinolone Acetonide (TRIAMCINOLONE 0.1 % CREAM : EUCERIN) CREA Apply 1 application topically daily. TRIAMCINOLONE 1%/ Absorbase    . furosemide (LASIX) 80 MG tablet Take 1 tablet (80 mg total) by mouth 2 (two) times daily.     No facility-administered medications prior to visit.      Allergies:   Cephalexin; Doxycycline; Keflex [cephalexin]; Lac bovis; Pentazocine lactate; Talwin [pentazocine]; Tape; and Zanaflex [tizanidine hcl]   Social History   Socioeconomic History  . Marital status: Married    Spouse name: Clinical research associate  . Number of children: 2  . Years of education: college  . Highest education level: Not on file  Occupational History  .  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 Financial resource strain: Not on file  . Food insecurity:    Worry: Not on file    Inability: Not on file  . Transportation needs:    Medical: Not on file    Non-medical: Not on file  Tobacco Use  . Smoking status: Never Smoker  . Smokeless tobacco: Never Used  Substance and Sexual Activity  . Alcohol use: No    Comment: pt states "I used to but not anymore"  . Drug use: No  . Sexual activity: Not Currently  Lifestyle  . Physical activity:    Days per week: Not on file    Minutes per session: Not on file  . Stress: Not on file  Relationships  . Social connections:    Talks on phone: Not on file    Gets together: Not on file    Attends religious service: Not on file    Active member of club or organization: Not on file    Attends meetings of clubs or organizations: Not on file    Relationship status: Not on file  Other Topics Concern  . Not on file  Social History Narrative   Patient lives at home with  wife      Patient drinks 2 cups of coffee a day.patient is left handed     Family History:  The patient's family history includes Asthma in his mother; Emphysema in his mother; Heart disease in his father; Hypertension in his mother; Pancreatic cancer in his paternal uncle; Prostate cancer in his paternal grandfather; Stroke in his paternal grandmother.   ROS:   Please see the history of present illness.    ROS All other systems reviewed and are negative.   PHYSICAL EXAM:   VS:  BP 106/65   Pulse 90   Wt 190 lb (86.2 kg)   BMI 29.76 kg/m    GEN: frail, sitting in wheelchair HEENT: normal  Neck: no JVD, carotid bruits, or masses Cardiac: RRR; no murmurs, rubs, or gallops,no edema  Respiratory:  clear to auscultation bilaterally, normal work of breathing GI: soft, nontender, nondistended, + BS MS: no deformity or atrophy  Skin: warm and dry, no rash Neuro:  Alert and Oriented x 3, Strength and  sensation are intact Psych: euthymic mood, full affect  Wt Readings from Last 3 Encounters:  11/27/17 190 lb (86.2 kg)  10/11/17 211 lb 10.3 oz (96 kg)  09/01/17 200 lb (90.7 kg)      Studies/Labs Reviewed:   EKG:  EKG is not ordered today.    Recent Labs: 08/26/2017: B Natriuretic Peptide 533.3 09/30/2017: TSH 1.239 10/06/2017: Magnesium 2.4 10/11/2017: ALT 115 11/27/2017: BUN 18; Creatinine, Ser 0.77; Hemoglobin 11.9; Platelets 313; Potassium 3.7; Sodium 141   Lipid Panel    Component Value Date/Time   CHOL 144 10/05/2017 0426   CHOL 187 03/31/2015 1123   TRIG 77 10/05/2017 0426   HDL 45 10/05/2017 0426   HDL 34 (L) 03/31/2015 1123   CHOLHDL 3.2 10/05/2017 0426   VLDL 15 10/05/2017 0426   LDLCALC 84 10/05/2017 0426   LDLCALC Comment 03/31/2015 1123    Additional studies/ records that were reviewed today include:   Limited echo 10/01/2017 LV EF: 20% -   25%  Study Conclusions  - Left ventricle: The cavity size was normal. Wall thickness was   increased in a pattern of mild LVH. Systolic function  was   severely reduced. The estimated ejection fraction was in the   range of 20% to 25%. - Aortic valve: Moderately calcified annulus. Moderately thickened,   moderately calcified leaflets. There was trivial regurgitation. - Mitral valve: There was mild to moderate regurgitation. - Tricuspid valve: There was moderate regurgitation. - Pulmonary arteries: Systolic pressure was moderately increased.   PA peak pressure: 42 mm Hg (S).    ASSESSMENT:    1. Severe muscle deconditioning   2. LV dysfunction   3. Anemia, unspecified type   4. Chronic systolic congestive heart failure ([place])   5. OSA on CPAP   6. Other pulmonary embolism without acute cor pulmonale, unspecified chronicity ([place])   7. Essential hypertension   8. Hyperlipidemia, unspecified hyperlipidemia type   9. Coronary artery disease involving coronary bypass graft of native heart without angina pectoris   10.  H/O: CVA (cerebrovascular accident)   93. Chronic respiratory insufficiency      PLAN:  In order of problems listed above:  1. Severe muscle deconditioning: He currently resides in [facility].  He will require physical therapy and occupational therapy before any meaningful recovery  2. LV dysfunction: Appears to be euvolemic on physical exam.  Recent echocardiogram showed EF 20-25%.  He is on carvedilol, spironolactone and losartan.  Unable to uptitrate heart failure medication.  We will repeat echocardiogram to see if there is any improvement in the ejection fraction.  He will not be a candidate for cardiac catheterization at this time given his severe deconditioning.  Although he was discharged on 80 mg twice daily of Lasix, however he is currently on 60 mg twice daily of Lasix instead.  Will continue on the current regimen since he appears to be euvolemic on the current dose.  I will obtain a CBC and basic metabolic panel today.  3. CAD: Recent decrease in LV dysfunction may be related to sepsis, however can not rule out progressive underlying disease.  Original plan is for cardiac catheterization once he improved, however right now he is dependent on others for all of his ADLs, I do not think he would be a candidate for cardiac catheterization at this time.  4. Hypertension: On carvedilol, spironolactone and losartan.  Blood pressure unable to be uptitrated.  5. Hyperlipidemia: On Lipitor 40 mg daily.  Last lipid panel in February 2019 showed a total cholesterol 144, triglycerides 77, HDL 45, LDL 84  6. History of PE in 2015: On Eliquis.    Medication Adjustments/Labs and Tests Ordered: Current medicines are reviewed at length with the patient today.  Concerns regarding medicines are outlined above.  Medication changes, Labs and Tests ordered today are listed in the Patient Instructions below. Patient Instructions  Medication Instructions: Jacob Deforest, PA-c recommends that you  continue on your current medications as directed. Please refer to the Current Medication list given to you today.  Labwork: Your physician recommends that you return for lab work TODAY.  Testing/Procedures: 1. Limited Echocardiogram in 1-2 weeks - Your physician has requested that you have an echocardiogram. Echocardiography is a painless test that uses sound waves to create images of your heart. It provides your doctor with information about the size and shape of your heart and how well your heart's chambers and valves are working. This procedure takes approximately one hour. There are no restrictions for this procedure. >>This will be performed at our [facility] location [place], [place] [address] [phone]  Follow-up: Your physician  recommends that you schedule a follow-up appointment in 1 month, with Isaac Laud, on a day Dr Martinique is in the office.  Isaac Laud recommends that you schedule a follow-up appointment in 2-3 months with Dr Martinique.  If you need a refill on your cardiac medications before your next appointment, please call your pharmacy.   A referral for physical and occupational therapy has been ordered.    Hilbert Corrigan, [place]  11/28/2017 4:08 PM    [facility] [address], [place], [place]  [address] Phone: [phone]; Fax: [phone]

## 2017-11-27 NOTE — Patient Instructions (Signed)
Medication Instructions: Almyra Deforest, PA-c recommends that you continue on your current medications as directed. Please refer to the Current Medication list given to you today.  Labwork: Your physician recommends that you return for lab work TODAY.  Testing/Procedures: 1. Limited Echocardiogram in 1-2 weeks - Your physician has requested that you have an echocardiogram. Echocardiography is a painless test that uses sound waves to create images of your heart. It provides your doctor with information about the size and shape of your heart and how well your heart's chambers and valves are working. This procedure takes approximately one hour. There are no restrictions for this procedure. >>This will be performed at our [facility] location [place] [place], [place] [address] [phone]  Follow-up: Your physician recommends that you schedule a follow-up appointment in 1 month, with Isaac Laud, on a day Dr Martinique is in the office.  Isaac Laud recommends that you schedule a follow-up appointment in 2-3 months with Dr Martinique.  If you need a refill on your cardiac medications before your next appointment, please call your pharmacy.   A referral for physical and occupational therapy has been ordered.

## 2017-11-28 ENCOUNTER — Encounter: Payer: Self-pay | Admitting: Physician Assistant

## 2017-11-28 LAB — CBC
HEMOGLOBIN: 11.9 g/dL — AB (ref 13.0–17.7)
Hematocrit: 36 % — ABNORMAL LOW (ref 37.5–51.0)
MCH: 29.5 pg (ref 26.6–33.0)
MCHC: 33.1 g/dL (ref 31.5–35.7)
MCV: 89 fL (ref 79–97)
Platelets: 313 10*3/uL (ref 150–379)
RBC: 4.03 x10E6/uL — ABNORMAL LOW (ref 4.14–5.80)
RDW: 17.1 % — ABNORMAL HIGH (ref 12.3–15.4)
WBC: 9.8 10*3/uL (ref 3.4–10.8)

## 2017-11-28 LAB — BASIC METABOLIC PANEL
BUN/Creatinine Ratio: 23 (ref 10–24)
BUN: 18 mg/dL (ref 8–27)
CALCIUM: 9 mg/dL (ref 8.6–10.2)
CO2: 24 mmol/L (ref 20–29)
Chloride: 94 mmol/L — ABNORMAL LOW (ref 96–106)
Creatinine, Ser: 0.77 mg/dL (ref 0.76–1.27)
GFR, EST AFRICAN AMERICAN: 104 mL/min/{1.73_m2} (ref >59)
GFR, EST NON AFRICAN AMERICAN: 90 mL/min/{1.73_m2} (ref >59)
Glucose: 141 mg/dL — ABNORMAL HIGH (ref 65–99)
POTASSIUM: 3.7 mmol/L (ref 3.5–5.2)
SODIUM: 141 mmol/L (ref 134–144)

## 2017-12-04 DIAGNOSIS — F0280 Dementia in other diseases classified elsewhere without behavioral disturbance: Secondary | ICD-10-CM | POA: Diagnosis not present

## 2017-12-04 DIAGNOSIS — R5381 Other malaise: Secondary | ICD-10-CM | POA: Diagnosis not present

## 2017-12-09 ENCOUNTER — Other Ambulatory Visit (HOSPITAL_COMMUNITY): Payer: Medicare Other

## 2017-12-09 DIAGNOSIS — I10 Essential (primary) hypertension: Secondary | ICD-10-CM | POA: Diagnosis not present

## 2017-12-09 DIAGNOSIS — R6510 Systemic inflammatory response syndrome (SIRS) of non-infectious origin without acute organ dysfunction: Secondary | ICD-10-CM | POA: Diagnosis not present

## 2017-12-09 DIAGNOSIS — A048 Other specified bacterial intestinal infections: Secondary | ICD-10-CM | POA: Diagnosis not present

## 2017-12-09 DIAGNOSIS — J690 Pneumonitis due to inhalation of food and vomit: Secondary | ICD-10-CM | POA: Diagnosis not present

## 2017-12-15 ENCOUNTER — Ambulatory Visit (HOSPITAL_COMMUNITY): Payer: Medicare Other | Attending: Internal Medicine

## 2017-12-15 ENCOUNTER — Other Ambulatory Visit: Payer: Self-pay

## 2017-12-15 DIAGNOSIS — I519 Heart disease, unspecified: Secondary | ICD-10-CM

## 2017-12-15 DIAGNOSIS — I2510 Atherosclerotic heart disease of native coronary artery without angina pectoris: Secondary | ICD-10-CM | POA: Diagnosis not present

## 2017-12-15 DIAGNOSIS — E119 Type 2 diabetes mellitus without complications: Secondary | ICD-10-CM | POA: Insufficient documentation

## 2017-12-15 DIAGNOSIS — I110 Hypertensive heart disease with heart failure: Secondary | ICD-10-CM | POA: Insufficient documentation

## 2017-12-15 DIAGNOSIS — E785 Hyperlipidemia, unspecified: Secondary | ICD-10-CM | POA: Diagnosis not present

## 2017-12-15 DIAGNOSIS — I5022 Chronic systolic (congestive) heart failure: Secondary | ICD-10-CM

## 2017-12-15 DIAGNOSIS — Z951 Presence of aortocoronary bypass graft: Secondary | ICD-10-CM | POA: Diagnosis not present

## 2017-12-15 DIAGNOSIS — I071 Rheumatic tricuspid insufficiency: Secondary | ICD-10-CM | POA: Diagnosis not present

## 2017-12-17 NOTE — Progress Notes (Signed)
Pumping function still low, but fortunately has improved from the previous 20-25% to 35-40% now, mildly leaky mitral valve

## 2017-12-19 DIAGNOSIS — I2510 Atherosclerotic heart disease of native coronary artery without angina pectoris: Secondary | ICD-10-CM | POA: Diagnosis not present

## 2017-12-19 DIAGNOSIS — I679 Cerebrovascular disease, unspecified: Secondary | ICD-10-CM | POA: Diagnosis not present

## 2017-12-19 DIAGNOSIS — I509 Heart failure, unspecified: Secondary | ICD-10-CM | POA: Diagnosis not present

## 2017-12-19 DIAGNOSIS — I4891 Unspecified atrial fibrillation: Secondary | ICD-10-CM | POA: Diagnosis not present

## 2017-12-19 DIAGNOSIS — F0391 Unspecified dementia with behavioral disturbance: Secondary | ICD-10-CM | POA: Diagnosis not present

## 2017-12-25 DIAGNOSIS — R05 Cough: Secondary | ICD-10-CM | POA: Diagnosis not present

## 2018-01-13 ENCOUNTER — Ambulatory Visit (INDEPENDENT_AMBULATORY_CARE_PROVIDER_SITE_OTHER): Payer: Medicare Other | Admitting: Physician Assistant

## 2018-01-13 ENCOUNTER — Encounter: Payer: Self-pay | Admitting: Physician Assistant

## 2018-01-13 VITALS — BP 103/65 | HR 76 | Ht 67.0 in | Wt 190.6 lb

## 2018-01-13 DIAGNOSIS — I25810 Atherosclerosis of coronary artery bypass graft(s) without angina pectoris: Secondary | ICD-10-CM | POA: Diagnosis not present

## 2018-01-13 DIAGNOSIS — R531 Weakness: Secondary | ICD-10-CM

## 2018-01-13 DIAGNOSIS — E785 Hyperlipidemia, unspecified: Secondary | ICD-10-CM

## 2018-01-13 DIAGNOSIS — I2782 Chronic pulmonary embolism: Secondary | ICD-10-CM | POA: Diagnosis not present

## 2018-01-13 DIAGNOSIS — I519 Heart disease, unspecified: Secondary | ICD-10-CM | POA: Diagnosis not present

## 2018-01-13 DIAGNOSIS — I5032 Chronic diastolic (congestive) heart failure: Secondary | ICD-10-CM | POA: Diagnosis not present

## 2018-01-13 DIAGNOSIS — Z8673 Personal history of transient ischemic attack (TIA), and cerebral infarction without residual deficits: Secondary | ICD-10-CM | POA: Diagnosis not present

## 2018-01-13 DIAGNOSIS — I10 Essential (primary) hypertension: Secondary | ICD-10-CM | POA: Diagnosis not present

## 2018-01-13 NOTE — Patient Instructions (Signed)
Medication Instructions:  Your physician recommends that you continue on your current medications as directed. Please refer to the Current Medication list given to you today.  Labwork: None   Testing/Procedures: None   Follow-Up: Keep followup appointment as scheduled with Dr Martinique.  Any Other Special Instructions Will Be Listed Below (If Applicable).  If you need a refill on your cardiac medications before your next appointment, please call your pharmacy.

## 2018-01-13 NOTE — Progress Notes (Signed)
Cardiology Office Note    Date:  01/13/2018   ID:  [name], [name] 12/12/1945, MRN [id number]  PCP:  Bernell List, MD  Cardiologist:  Dr. Martinique Electrophysiologist: Dr. Caryl Comes   Chief Complaint  Patient presents with  . Follow-up    seen for Dr. Martinique.     History of Present Illness:  [name] is a 72 y.o. male with PMH of HTN, HLD, PE in 05/2014 on eliquis, OSA on CPAP, CAD s/p CABG (LIMA to LAD, SVG to diagonal, SVG to left circumflex marginal branch, SVG to RCA on 12/21/2003), chronic diastolic HF, and h/o cryptogenic stroke with loop recorder, chronic home O2 for possible asbestos toxicity.  He had a PDA with a right internal carotid stenosis of 70% followed by neurology and a history of seizure.  Echocardiogram in September 2015 showed normal LV function.  He had a low risk Myoview in 2016.  Last echocardiogram obtained in January 2019 showed EF 50-55% grade 1 DD, mild MR.  He was admitted in January with heart failure, and was ruled in for NSTEMI which was felt to be demand ischemia, troponin peaked at 0.61.  He underwent diuresis and had AKI however renal function improved prior to discharge.  He was in rehab in [place] and had a C. difficile infection.  Most recently, patient presented with severe weakness.  He was ruled in for NSTEMI was a troponin of 13.  He was treated with IV heparin.  Hospitalist manage the patient for sepsis, cardiology was consulted on the side.  Given lack of chest discomfort, cardiology did not immediately proceed with invasive study.  Limited echo obtained on 10/01/2017 showed EF 20-25%, moderate MR, moderate TR, PA peak pressure 42 mmHg.  Further ischemic workup was held off until clinical improvement.  The decision was made to potentially consider cardiac catheterization once he improved physically, and if he does not improve physically, may consider medical therapy instead.  He was placed on carvedilol and Cozaar, Lasix increased to 40 mg twice daily  and continued on spironolactone. Dr. Sallyanne Kuster recommended on 10/10/2017 to repeat echocardiogram as outpatient to see if there is any improvement in the ejection fraction and would only consider invasive coronary angiography if EF still shows substantial LV dysfunction.  I last saw the patient on 11/27/2017, after his stroke 4 years ago, he continued to have left-sided weakness.  He was able to do majority of ADLs at home and to take care of himself, however since January of this year, he was essentially wheelchair-bound.  He was not a good candidate for any invasive study at the time.  Even though he was discharged on 80 mg twice a day of Lasix, however he was actually on 60 mg twice a day of Lasix at [facility], since he was euvolemic on exam, I continued him on that dose.  I repeated an echocardiogram on 12/13/2017, his ejection fraction has improved to 35 to 40%, there was mild mitral regurgitation.  Patient presents today for cardiology office visit. Since last visit, he has been transferred to [facility]. From functional and clinical perspective, he has improved.  He continued to have episodes where he swallows air and burp them out.  His functional ability is fairly limited at this time however he is receiving physical therapy at [facility] [place] which I think is helping him greatly.  I have discussed the case with Dr. Martinique, at this time further cardiac catheterization may not be in his  best interest.  We will continue to treat him medically with carvedilol, losartan and spironolactone.  Otherwise he appears to be euvolemic on physical exam.   Past Medical History:  Diagnosis Date  . AKI (acute kidney injury) ([place])    a. 6/14: resolved after d/c ARB;  b. RA U/S 7/14: no RA stenosis  . Arthritis    of spine  . Atrial flutter ([place])    a. 2008 - one episode in setting of sepsis/shock.  . Back pain    persistent  . Coronary artery disease    a. s/p CABG 2005. b. low risk nuc 2016.  Marland Kitchen  Depression   . Diabetes mellitus    controled by diet  . Diverticulitis    a. s/p partial colectomy remotely.  Marland Kitchen GERD (gastroesophageal reflux disease)   . H/O alcohol abuse   . Headache(784.0)   . Hearing loss   . History of hiatal hernia   . Hx of echocardiogram    Echo 7/14:  Mod LVH, EF 55-60%, Gr 1 DD, mild MR, PASP 36  . Hyperlipidemia   . Hypertension   . Pulmonary embolism ([place]) 2005  . Renal stone 04/15/2014  . Seizures ([place])   . Sleep apnea    does not use CPAP  . Stroke [facility])     Past Surgical History:  Procedure Laterality Date  . APPENDECTOMY    . BACK SURGERY    . CARDIAC CATHETERIZATION    . CERVICAL FUSION    . CORONARY ARTERY BYPASS GRAFT  2005   LIMA graft to LAD,saphenous vein graft to diag.,circumflex, marginal,and to the RCA  . CYSTOSCOPY/RETROGRADE/URETEROSCOPY/STONE EXTRACTION WITH BASKET Left 04/16/2014   Procedure: CYSTOSCOPY/RETROGRADE/LEFT URETEROSCOPY/STONE EXTRACTION WITH BASKET AND LASER LITHOTRIPSY;  Surgeon: Malka So, MD;  Location: [facility] ORS;  Service: Urology;  Laterality: Left;  With STENT  . ESOPHAGOGASTRODUODENOSCOPY  02/10/2012   Procedure: ESOPHAGOGASTRODUODENOSCOPY (EGD);  Surgeon: Cleotis Nipper, MD;  Location: [facility] ENDOSCOPY;  Service: Endoscopy;  Laterality: N/A;  . ESOPHAGOGASTRODUODENOSCOPY  06/29/2012   Procedure: ESOPHAGOGASTRODUODENOSCOPY (EGD);  Surgeon: Winfield Cunas., MD;  Location: [facility] ENDOSCOPY;  Service: Endoscopy;  Laterality: N/A;  . FOREIGN BODY REMOVAL  02/10/2012   Procedure: FOREIGN BODY REMOVAL;  Surgeon: Cleotis Nipper, MD;  Location: [facility];  Service: Endoscopy;  Laterality: N/A;  . FOREIGN BODY REMOVAL  06/29/2012   Procedure: FOREIGN BODY REMOVAL;  Surgeon: Winfield Cunas., MD;  Location: [facility] ENDOSCOPY;  Service: Endoscopy;  Laterality: N/A;  . LEFT HEART CATHETERIZATION WITH CORONARY ANGIOGRAM N/A 04/02/2012   Procedure: LEFT HEART CATHETERIZATION WITH CORONARY ANGIOGRAM;  Surgeon: Sinclair Grooms,  MD;  Location: [facility] CATH LAB;  Service: Cardiovascular;  Laterality: N/A;  . LOOP RECORDER IMPLANT N/A 11/17/2013   Procedure: LOOP RECORDER IMPLANT;  Surgeon: Deboraha Sprang, MD;  Location: [facility] CATH LAB;  Service: Cardiovascular;  Laterality: N/A;  . ORCHIECTOMY    . PARTIAL COLECTOMY     for diverticuli  . SAVORY DILATION  06/29/2012   Procedure: SAVORY DILATION;  Surgeon: Winfield Cunas., MD;  Location: [facility] ENDOSCOPY;  Service: Endoscopy;  Laterality: N/A;  . TEE WITHOUT CARDIOVERSION N/A 11/17/2013   Procedure: TRANSESOPHAGEAL ECHOCARDIOGRAM (TEE);  Surgeon: Larey Dresser, MD;  Location: [facility];  Service: Cardiovascular;  Laterality: N/A;  . UMBILICAL HERNIA REPAIR      Current Medications: Outpatient Medications Prior to Visit  Medication Sig Dispense Refill  . acetaminophen (TYLENOL) 325 MG tablet Take 2  tablets (650 mg total) by mouth every 4 (four) hours as needed for mild pain, fever or headache.    Marland Kitchen apixaban (ELIQUIS) 5 MG TABS tablet Take 1 tablet (5 mg total) by mouth 2 (two) times daily. 60 tablet   . Artificial Tear Solution (GENTEAL TEARS OP) Apply 1 drop to eye as needed (dry eyes).    Marland Kitchen aspirin EC 81 MG EC tablet Take 1 tablet (81 mg total) by mouth daily.    Marland Kitchen atorvastatin (LIPITOR) 40 MG tablet Take 1 tablet (40 mg total) by mouth daily at 6 PM.    . camphor-menthol (SARNA) lotion Apply 1 application topically daily. To entire body    . carvedilol (COREG) 3.125 MG tablet Take 1 tablet (3.125 mg total) by mouth 2 (two) times daily with a meal.    . donepezil (ARICEPT) 10 MG tablet Take 1 tablet (10 mg total) by mouth daily.    . Fluocinolone Acetonide 0.01 % OIL Apply 1 application topically daily as needed (itching). Apply to scalp    . FLUoxetine (PROZAC) 40 MG capsule Take 1 capsule (40 mg total) by mouth daily.  3  . fluticasone (FLONASE) 50 MCG/ACT nasal spray Place 1 spray into both nostrils 2 (two) times daily as needed for rhinitis.  2  . furosemide (LASIX)  20 MG tablet Take 60 mg by mouth daily.    Marland Kitchen gabapentin (NEURONTIN) 100 MG capsule Take 100 mg by mouth at bedtime. May take 1-3 caps    . hyoscyamine (LEVSIN SL) 0.125 MG SL tablet Place 0.125 mg under the tongue every 4 (four) hours as needed.    . insulin aspart (NOVOLOG) 100 UNIT/ML injection Inject 0-15 Units into the skin 3 (three) times daily with meals. 10 mL 11  . ipratropium-albuterol (DUONEB) 0.5-2.5 (3) MG/3ML SOLN     . lamoTRIgine (LAMICTAL) 100 MG tablet Take 1 tablet (100 mg total) by mouth 2 (two) times daily.    Marland Kitchen losartan (COZAAR) 25 MG tablet Take 12.5 mg by mouth daily.    . Melatonin 3 MG TABS Take by mouth.    . Multiple Vitamin (MULTIVITAMIN) capsule Take 1 capsule by mouth daily.    . nitroGLYCERIN (NITROSTAT) 0.4 MG SL tablet Place 1 tablet (0.4 mg total) under the tongue every 5 (five) minutes as needed (as needed for chest pain or with symptoms of esophageal spasm).  12  . OXYGEN Inhale 2.5 L into the lungs.    . potassium chloride (K-DUR) 10 MEQ tablet Take 10 mEq by mouth every morning.    . ranitidine (ZANTAC) 150 MG tablet Take 1 tablet by mouth every morning.    Marland Kitchen spironolactone (ALDACTONE) 25 MG tablet Take 1 tablet (25 mg total) by mouth daily.    . traZODone (DESYREL) 100 MG tablet Take 1 tablet (100 mg total) by mouth at bedtime.    . Triamcinolone Acetonide (TRIAMCINOLONE 0.1 % CREAM : EUCERIN) CREA Apply 1 application topically daily. TRIAMCINOLONE 1%/ Absorbase    . ARIPiprazole (ABILIFY) 5 MG tablet Take 1 tablet (5 mg total) by mouth daily.    . furosemide (LASIX) 20 MG tablet Take 20 mg by mouth 2 (two) times daily.    . furosemide (LASIX) 40 MG tablet Take 40 mg by mouth 2 (two) times daily.    Marland Kitchen losartan (COZAAR) 25 MG tablet Take 1 tablet (25 mg total) by mouth daily.    . potassium citrate (UROCIT-K) 10 MEQ (1080 MG) SR tablet Take 1 tablet (10  mEq total) by mouth 2 (two) times daily.     No facility-administered medications prior to visit.       Allergies:   Cephalexin; Lac bovis; Other; Pentazocine; Tizanidine; Cephalexin; Doxycycline; Pentazocine lactate; Tape; and Zanaflex [tizanidine hcl]   Social History   Socioeconomic History  . Marital status: Married    Spouse name: Clinical research associate  . Number of children: 2  . Years of education: college  . Highest education level: Not on file  Occupational History  . 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 Financial resource strain: Not on file  . Food insecurity:    Worry: Not on file    Inability: Not on file  . Transportation needs:    Medical: Not on file    Non-medical: Not on file  Tobacco Use  . Smoking status: Never Smoker  . Smokeless tobacco: Never Used  Substance and Sexual Activity  . Alcohol use: No    Comment: pt states "I used to but not anymore"  . Drug use: No  . Sexual activity: Not Currently  Lifestyle  . Physical activity:    Days per week: Not on file    Minutes per session: Not on file  . Stress: Not on file  Relationships  . Social connections:    Talks on phone: Not on file    Gets together: Not on file    Attends religious service: Not on file    Active member of club or organization: Not on file    Attends meetings of clubs or organizations: Not on file    Relationship status: Not on file  Other Topics Concern  . Not on file  Social History Narrative   Patient lives at home with  wife      Patient drinks 2 cups of coffee a day.patient is left handed     Family History:  The patient's family history includes Asthma in his mother; Emphysema in his mother; Heart disease in his father; Hypertension in his mother; Pancreatic cancer in his paternal uncle; Prostate cancer in his paternal grandfather; Stroke in his paternal grandmother.   ROS:   Please see the history of present illness.    ROS All other systems reviewed and are negative.   PHYSICAL EXAM:   VS:  BP 103/65   Pulse 76   Ht 5' 7"   (1.702 m)   Wt 190 lb 9.6 oz (86.5 kg)   BMI 29.85 kg/m    GEN: sitting wheelchair, in no acute distress  HEENT: normal  Neck: no JVD, carotid bruits, or masses Cardiac: RRR; no murmurs, rubs, or gallops,no edema  Respiratory:  clear to auscultation bilaterally, normal work of breathing GI: soft, nontender, nondistended, + BS MS: no deformity or atrophy  Skin: warm and dry, no rash Neuro:  Alert and Oriented x 3, Strength and sensation are intact Psych: euthymic mood, full affect  Wt Readings from Last 3 Encounters:  01/13/18 190 lb 9.6 oz (86.5 kg)  11/27/17 190 lb (86.2 kg)  10/11/17 211 lb 10.3 oz (96 kg)      Studies/Labs Reviewed:   EKG:  EKG is not ordered today.   Recent Labs: 08/26/2017: B Natriuretic Peptide 533.3 09/30/2017: TSH 1.239 10/06/2017: Magnesium 2.4 10/11/2017: ALT 115 11/27/2017: BUN 18; Creatinine, Ser 0.77; Hemoglobin 11.9; Platelets 313; Potassium 3.7; Sodium 141   Lipid Panel    Component Value Date/Time   CHOL 144 10/05/2017 0426  CHOL 187 03/31/2015 1123   TRIG 77 10/05/2017 0426   HDL 45 10/05/2017 0426   HDL 34 (L) 03/31/2015 1123   CHOLHDL 3.2 10/05/2017 0426   VLDL 15 10/05/2017 0426   LDLCALC 84 10/05/2017 0426   LDLCALC Comment 03/31/2015 1123    Additional studies/ records that were reviewed today include:   Echo 12/15/2017 LV EF: 35% -   40% Study Conclusions  - Procedure narrative: Transthoracic echocardiography for left   ventricular function evaluation. Image quality was adequate. The   study was technically difficult, as a result of poor patient   compliance and restricted patient mobility. - Left ventricle: The cavity size was normal. Wall thickness was   increased in a pattern of mild LVH. Systolic function was   moderately reduced. The estimated ejection fraction was in the   range of 35% to 40%. Diffuse hypokinesis with regional variation. - Mitral valve: Mildly thickened leaflets . There was mild   regurgitation. -  Left atrium: The atrium was mildly dilated.  Impressions:  - Compared to a prior study in 09/2017, the LVEF has improved from   20-25% up to 35-40%.    ASSESSMENT:    1. LV dysfunction   2. Weakness   3. Coronary artery disease involving coronary bypass graft of native heart without angina pectoris   4. Essential hypertension   5. Hyperlipidemia, unspecified hyperlipidemia type   6. Chronic diastolic heart failure ([place])   7. H/O: CVA (cerebrovascular accident)   8. Chronic pulmonary embolism without acute cor pulmonale, unspecified pulmonary embolism type (HCC)      PLAN:  In order of problems listed above:  1. LV dysfunction: Euvolemic on physical exam, echocardiogram obtained in February 2019 showed EF 20 to 25% which is down from the previous 50 to 55% in January 2019.  Since then, repeat limited echocardiogram in April showed EF has improved to 35 to 40% on carvedilol, losartan and spironolactone.    2. Deconditioning: He has been transferred to [facility] [place] and has been undergoing physical therapy, both from clinical perspective and functional perspective, he has significantly improved compared to the last office visit however he is still quite sedentary at baseline  3. CAD: The original plan is to proceed with invasive ischemic work-up once his physical condition improves, however at this time even with the clinical improvement, he is still sedentary majority of the time.  I have discussed the case with Dr. Martinique, I do not think cardiac catheterization at this time will give him enough benefit, Dr. Martinique also agrees.  4. Hypertension: Blood pressure stable on current medication  5. Hyperlipidemia: On Lipitor.  Cholesterol quite well controlled on last lipid panel  6. History of PE in 2015: On Eliquis, no recurrence    Medication Adjustments/Labs and Tests Ordered: Current medicines are reviewed at length with the patient today.  Concerns regarding medicines  are outlined above.  Medication changes, Labs and Tests ordered today are listed in the Patient Instructions below. Patient Instructions  Medication Instructions:  Your physician recommends that you continue on your current medications as directed. Please refer to the Current Medication list given to you today.  Labwork: None   Testing/Procedures: None   Follow-Up: Keep followup appointment as scheduled with Dr Martinique.  Any Other Special Instructions Will Be Listed Below (If Applicable).  If you need a refill on your cardiac medications before your next appointment, please call your pharmacy.     Signed, Almyra Deforest, PA  01/13/2018 5:56 PM    [facility] [place], [place], [place]  [address] Phone: [phone]; Fax: [phone]

## 2018-01-21 DIAGNOSIS — F329 Major depressive disorder, single episode, unspecified: Secondary | ICD-10-CM | POA: Diagnosis not present

## 2018-01-21 DIAGNOSIS — F0390 Unspecified dementia without behavioral disturbance: Secondary | ICD-10-CM | POA: Diagnosis not present

## 2018-01-21 DIAGNOSIS — J449 Chronic obstructive pulmonary disease, unspecified: Secondary | ICD-10-CM | POA: Diagnosis not present

## 2018-01-21 DIAGNOSIS — I509 Heart failure, unspecified: Secondary | ICD-10-CM | POA: Diagnosis not present

## 2018-01-26 ENCOUNTER — Other Ambulatory Visit: Payer: Self-pay

## 2018-01-26 ENCOUNTER — Emergency Department (HOSPITAL_COMMUNITY): Payer: Medicare Other

## 2018-01-26 ENCOUNTER — Encounter (HOSPITAL_COMMUNITY): Payer: Self-pay

## 2018-01-26 ENCOUNTER — Inpatient Hospital Stay (HOSPITAL_COMMUNITY)
Admission: EM | Admit: 2018-01-26 | Discharge: 2018-01-31 | DRG: 246 | Disposition: A | Discharge status: Skilled Nursing Facility | Payer: Medicare Other | Attending: Cardiovascular Disease | Admitting: Cardiovascular Disease

## 2018-01-26 DIAGNOSIS — R0689 Other abnormalities of breathing: Secondary | ICD-10-CM | POA: Diagnosis not present

## 2018-01-26 DIAGNOSIS — R1110 Vomiting, unspecified: Secondary | ICD-10-CM

## 2018-01-26 DIAGNOSIS — K219 Gastro-esophageal reflux disease without esophagitis: Secondary | ICD-10-CM | POA: Diagnosis present

## 2018-01-26 DIAGNOSIS — Z881 Allergy status to other antibiotic agents status: Secondary | ICD-10-CM

## 2018-01-26 DIAGNOSIS — R0602 Shortness of breath: Secondary | ICD-10-CM | POA: Diagnosis not present

## 2018-01-26 DIAGNOSIS — Z955 Presence of coronary angioplasty implant and graft: Secondary | ICD-10-CM

## 2018-01-26 DIAGNOSIS — Z951 Presence of aortocoronary bypass graft: Secondary | ICD-10-CM

## 2018-01-26 DIAGNOSIS — F0390 Unspecified dementia without behavioral disturbance: Secondary | ICD-10-CM | POA: Diagnosis present

## 2018-01-26 DIAGNOSIS — E119 Type 2 diabetes mellitus without complications: Secondary | ICD-10-CM | POA: Diagnosis present

## 2018-01-26 DIAGNOSIS — I5043 Acute on chronic combined systolic (congestive) and diastolic (congestive) heart failure: Secondary | ICD-10-CM | POA: Diagnosis present

## 2018-01-26 DIAGNOSIS — I25810 Atherosclerosis of coronary artery bypass graft(s) without angina pectoris: Secondary | ICD-10-CM | POA: Diagnosis not present

## 2018-01-26 DIAGNOSIS — I69359 Hemiplegia and hemiparesis following cerebral infarction affecting unspecified side: Secondary | ICD-10-CM

## 2018-01-26 DIAGNOSIS — I69328 Other speech and language deficits following cerebral infarction: Secondary | ICD-10-CM

## 2018-01-26 DIAGNOSIS — Z888 Allergy status to other drugs, medicaments and biological substances status: Secondary | ICD-10-CM

## 2018-01-26 DIAGNOSIS — I214 Non-ST elevation (NSTEMI) myocardial infarction: Secondary | ICD-10-CM | POA: Diagnosis not present

## 2018-01-26 DIAGNOSIS — R112 Nausea with vomiting, unspecified: Secondary | ICD-10-CM | POA: Diagnosis not present

## 2018-01-26 DIAGNOSIS — Z8249 Family history of ischemic heart disease and other diseases of the circulatory system: Secondary | ICD-10-CM

## 2018-01-26 DIAGNOSIS — G4733 Obstructive sleep apnea (adult) (pediatric): Secondary | ICD-10-CM | POA: Diagnosis present

## 2018-01-26 DIAGNOSIS — Z79899 Other long term (current) drug therapy: Secondary | ICD-10-CM

## 2018-01-26 DIAGNOSIS — R509 Fever, unspecified: Secondary | ICD-10-CM | POA: Diagnosis not present

## 2018-01-26 DIAGNOSIS — K224 Dyskinesia of esophagus: Secondary | ICD-10-CM | POA: Diagnosis present

## 2018-01-26 DIAGNOSIS — R1314 Dysphagia, pharyngoesophageal phase: Secondary | ICD-10-CM | POA: Diagnosis present

## 2018-01-26 DIAGNOSIS — Z981 Arthrodesis status: Secondary | ICD-10-CM

## 2018-01-26 DIAGNOSIS — I110 Hypertensive heart disease with heart failure: Secondary | ICD-10-CM | POA: Diagnosis present

## 2018-01-26 DIAGNOSIS — T17908A Unspecified foreign body in respiratory tract, part unspecified causing other injury, initial encounter: Secondary | ICD-10-CM

## 2018-01-26 DIAGNOSIS — I252 Old myocardial infarction: Secondary | ICD-10-CM

## 2018-01-26 DIAGNOSIS — T463X5A Adverse effect of coronary vasodilators, initial encounter: Secondary | ICD-10-CM | POA: Diagnosis not present

## 2018-01-26 DIAGNOSIS — R0789 Other chest pain: Secondary | ICD-10-CM | POA: Diagnosis not present

## 2018-01-26 DIAGNOSIS — R0902 Hypoxemia: Secondary | ICD-10-CM | POA: Diagnosis not present

## 2018-01-26 DIAGNOSIS — E785 Hyperlipidemia, unspecified: Secondary | ICD-10-CM | POA: Diagnosis present

## 2018-01-26 DIAGNOSIS — H9190 Unspecified hearing loss, unspecified ear: Secondary | ICD-10-CM | POA: Diagnosis present

## 2018-01-26 DIAGNOSIS — I952 Hypotension due to drugs: Secondary | ICD-10-CM | POA: Diagnosis not present

## 2018-01-26 DIAGNOSIS — Z91048 Other nonmedicinal substance allergy status: Secondary | ICD-10-CM

## 2018-01-26 DIAGNOSIS — J811 Chronic pulmonary edema: Secondary | ICD-10-CM | POA: Diagnosis not present

## 2018-01-26 DIAGNOSIS — Z7901 Long term (current) use of anticoagulants: Secondary | ICD-10-CM

## 2018-01-26 DIAGNOSIS — R066 Hiccough: Secondary | ICD-10-CM | POA: Diagnosis present

## 2018-01-26 DIAGNOSIS — R1310 Dysphagia, unspecified: Secondary | ICD-10-CM

## 2018-01-26 DIAGNOSIS — R079 Chest pain, unspecified: Secondary | ICD-10-CM | POA: Diagnosis not present

## 2018-01-26 DIAGNOSIS — Z86711 Personal history of pulmonary embolism: Secondary | ICD-10-CM

## 2018-01-26 DIAGNOSIS — I249 Acute ischemic heart disease, unspecified: Secondary | ICD-10-CM | POA: Diagnosis present

## 2018-01-26 DIAGNOSIS — J709 Respiratory conditions due to unspecified external agent: Secondary | ICD-10-CM | POA: Diagnosis not present

## 2018-01-26 DIAGNOSIS — Z7902 Long term (current) use of antithrombotics/antiplatelets: Secondary | ICD-10-CM

## 2018-01-26 DIAGNOSIS — R1319 Other dysphagia: Secondary | ICD-10-CM

## 2018-01-26 LAB — I-STAT TROPONIN, ED: Troponin i, poc: 0.34 ng/mL (ref 0.00–0.08)

## 2018-01-26 LAB — CBC
HEMATOCRIT: 34.2 % — AB (ref 39.0–52.0)
HEMOGLOBIN: 11 g/dL — AB (ref 13.0–17.0)
MCH: 31.2 pg (ref 26.0–34.0)
MCHC: 32.2 g/dL (ref 30.0–36.0)
MCV: 96.9 fL (ref 78.0–100.0)
Platelets: 261 10*3/uL (ref 150–400)
RBC: 3.53 MIL/uL — AB (ref 4.22–5.81)
RDW: 14.2 % (ref 11.5–15.5)
WBC: 9.8 10*3/uL (ref 4.0–10.5)

## 2018-01-26 LAB — BASIC METABOLIC PANEL
ANION GAP: 11 (ref 5–15)
BUN: 22 mg/dL — AB (ref 6–20)
CO2: 26 mmol/L (ref 22–32)
Calcium: 9 mg/dL (ref 8.9–10.3)
Chloride: 102 mmol/L (ref 101–111)
Creatinine, Ser: 1.11 mg/dL (ref 0.61–1.24)
GFR calc Af Amer: >60 mL/min (ref >60)
Glucose, Bld: 181 mg/dL — ABNORMAL HIGH (ref 65–99)
Potassium: 3.7 mmol/L (ref 3.5–5.1)
Sodium: 139 mmol/L (ref 135–145)

## 2018-01-26 MED ORDER — NITROGLYCERIN IN D5W 200-5 MCG/ML-% IV SOLN
0.0000 ug/min | Freq: Once | INTRAVENOUS | Status: AC
  Administered 2018-01-26: 5 ug/min via INTRAVENOUS
  Filled 2018-01-26: qty 250

## 2018-01-26 MED ORDER — SODIUM CHLORIDE 0.9 % IV BOLUS
500.0000 mL | Freq: Once | INTRAVENOUS | Status: AC
  Administered 2018-01-26: 500 mL via INTRAVENOUS

## 2018-01-26 MED ORDER — FENTANYL CITRATE (PF) 100 MCG/2ML IJ SOLN
50.0000 ug | Freq: Once | INTRAMUSCULAR | Status: AC
Start: 2018-01-26 — End: 2018-01-26
  Administered 2018-01-26: 50 ug via INTRAVENOUS
  Filled 2018-01-26: qty 2

## 2018-01-26 MED ORDER — ONDANSETRON HCL 4 MG/2ML IJ SOLN
4.0000 mg | Freq: Once | INTRAMUSCULAR | Status: AC
  Administered 2018-01-26: 4 mg via INTRAVENOUS
  Filled 2018-01-26: qty 2

## 2018-01-26 NOTE — ED Triage Notes (Addendum)
Patient from [place] facility for chest pain.  Received 324 ASA, 2 nitro tabs, 4 mg morphine from EMS.  Initial BP 116/76 after meds is 88/34.  HR stayed about 100 throughout transport.  On 4L O2 chronically.

## 2018-01-26 NOTE — ED Provider Notes (Addendum)
[place] EMERGENCY DEPARTMENT Provider Note  CSN: [id number] Arrival date & time: 01/26/18 2249  Chief Complaint(s) Chest Pain  HPI [name] is a 72 y.o. male with an extensive past medical history listed below including coronary artery disease status post quadruple bypass in 2005 and recent NSTEMI who presents to the emergency department with 2 hours of substernal chest pain described as sharp, nonradiating, with associated shortness of breath, nausea.  No alleviating or aggravating factors.  Patient has not tried ambulating since the pain began.  Was given aspirin and nitroglycerin by EMS without relief.  Denies any recent fevers or infections.  Endorses mild cough.  No abdominal pain.  No diarrhea.  No urinary symptoms.  No headache.  HPI  Past Medical History Past Medical History:  Diagnosis Date  . AKI (acute kidney injury) ([place])    a. 6/14: resolved after d/c ARB;  b. RA U/S 7/14: no RA stenosis  . Arthritis    of spine  . Atrial flutter ([place])    a. 2008 - one episode in setting of sepsis/shock.  . Back pain    persistent  . Coronary artery disease    a. s/p CABG 2005. b. low risk nuc 2016.  Marland Kitchen Depression   . Diabetes mellitus    controled by diet  . Diverticulitis    a. s/p partial colectomy remotely.  Marland Kitchen GERD (gastroesophageal reflux disease)   . H/O alcohol abuse   . Headache(784.0)   . Hearing loss   . History of hiatal hernia   . Hx of echocardiogram    Echo 7/14:  Mod LVH, EF 55-60%, Gr 1 DD, mild MR, PASP 36  . Hyperlipidemia   . Hypertension   . Pulmonary embolism ([place]) 2005  . Renal stone 04/15/2014  . Seizures ([place])   . Sleep apnea    does not use CPAP  . Stroke [facility]    Patient Active Problem List   Diagnosis Date Noted  . ACS (acute coronary syndrome) ([place]) 01/27/2018  . Dementia 09/30/2017  . Chronic diarrhea   . Severe comorbid illness   . Pressure injury of skin 08/28/2017  . NSTEMI (non-ST elevated myocardial  infarction) ([place]) 08/27/2017  . Acute CHF (congestive heart failure) ([place]) 08/27/2017  . Rectal bleeding 07/07/2017  . Anemia   . Generalized weakness 04/25/2017  . Cerebrovascular accident (CVA) due to embolism of right anterior cerebral artery ([place]) 09/19/2015  . History of CVA with residual deficit 09/19/2015  . HLD (hyperlipidemia) 07/12/2015  . Essential hypertension 07/12/2015  . Coronary artery disease involving coronary bypass graft of native heart with angina pectoris ([place]) 07/12/2015  . Intracranial carotid stenosis 07/12/2015  . Morbid obesity ([place]) 02/21/2015  . Dysarthria   . History of pulmonary embolism   . Acute right ACA stroke ([place]) 02/11/2015  . Lower extremity weakness 01/08/2015  . Status post placement of implantable loop recorder 08/01/2014  . Chronic respiratory failure, unspecified whether with hypoxia or hypercapnia ([place]) 05/19/2014  . Pulmonary embolism ([place]) 05/19/2014  . Hemiparesis and speech and language deficit as late effects of stroke ([place]) 04/19/2014  . Ureteral colic 33/00/7622  . Left ureteral stone 04/15/2014  . Obstructive sleep apnea 12/07/2013  . Obesity, unspecified 12/07/2013  . Embolic stroke ([place]) 63/33/5456  . Cough 06/09/2013  . S/P lumbar spine operation 06/02/2013  . Nerve root pain 06/02/2013  . Pleural plaque consistent with asbestos exposure by CT 10/2012 02/17/2013  . Renal failure 02/13/2013  . Dyspnea  02/11/2013  . Dysphagia, pharyngoesophageal phase 05/12/2012  . Seizure disorder ([place]) 04/04/2012  . CAD (coronary artery disease) 06/13/2011  . Diabetes mellitus ([place]) 06/13/2011  . Depression   . Back pain   . Hearing loss   . Arrhythmia   . Diverticulitis   . H/O alcohol abuse   . Personal history of other diseases of circulatory system 05/29/2011   Home Medication(s) Prior to Admission medications   Medication Sig Start Date End Date Taking? Authorizing Provider  acetaminophen (TYLENOL) 325 MG tablet Take 2 tablets  (650 mg total) by mouth every 4 (four) hours as needed for mild pain, fever or headache. 10/10/17  Yes Cherene Altes, MD  apixaban (ELIQUIS) 5 MG TABS tablet Take 1 tablet (5 mg total) by mouth 2 (two) times daily. 10/10/17  Yes Cherene Altes, MD  atorvastatin (LIPITOR) 40 MG tablet Take 1 tablet (40 mg total) by mouth daily at 6 PM. 10/10/17  Yes Cherene Altes, MD  carvedilol (COREG) 3.125 MG tablet Take 1 tablet (3.125 mg total) by mouth 2 (two) times daily with a meal. 10/10/17  Yes Cherene Altes, MD  donepezil (ARICEPT) 10 MG tablet Take 1 tablet (10 mg total) by mouth daily. Patient taking differently: Take 10 mg by mouth every evening.  10/11/17  Yes Cherene Altes, MD  Fluocinolone Acetonide 0.01 % OIL Apply 1 application topically daily as needed (itching). Apply to scalp   Yes [provider]  FLUoxetine (PROZAC) 40 MG capsule Take 1 capsule (40 mg total) by mouth daily. 10/11/17  Yes Cherene Altes, MD  fluticasone (FLONASE) 50 MCG/ACT nasal spray Place 1 spray into both nostrils 2 (two) times daily as needed for rhinitis. 10/10/17  Yes Cherene Altes, MD  furosemide (LASIX) 20 MG tablet Take 60 mg by mouth daily.   Yes [provider]  gabapentin (NEURONTIN) 100 MG capsule Take 100 mg by mouth at bedtime.    Yes [provider]  gabapentin (NEURONTIN) 100 MG capsule Take 100-300 mg by mouth at bedtime as needed (for itching).   Yes [provider]  hydroxypropyl methylcellulose / hypromellose (ISOPTO TEARS / GONIOVISC) 2.5 % ophthalmic solution Place 1 drop into both eyes 2 (two) times daily as needed for dry eyes.   Yes [provider]  hyoscyamine (LEVSIN SL) 0.125 MG SL tablet Place 0.125 mg under the tongue every 4 (four) hours as needed for cramping.    Yes [provider]  ipratropium-albuterol (DUONEB) 0.5-2.5 (3) MG/3ML SOLN Take 3 mLs by nebulization 2 (two) times daily.  12/10/17  Yes [provider]  lamoTRIgine (LAMICTAL) 100 MG tablet Take 1 tablet (100 mg total) by mouth 2 (two) times daily. 10/10/17  Yes Cherene Altes, MD  losartan (COZAAR) 25 MG tablet Take 12.5 mg by mouth daily.   Yes [provider]  Melatonin 3 MG TABS Take 3 mg by mouth at bedtime.    Yes [provider]  Multiple Vitamin (MULTIVITAMIN WITH MINERALS) TABS tablet Take 1 tablet by mouth daily.   Yes [provider]  potassium chloride (K-DUR) 10 MEQ tablet Take 10 mEq by mouth daily.  01/03/18  Yes [provider]  psyllium (REGULOID) 0.52 g capsule Take 0.52 g by mouth 2 (two) times daily.   Yes [provider]  ranitidine (ZANTAC) 150 MG tablet Take 150 mg by mouth daily.  01/03/18  Yes [provider]  spironolactone (ALDACTONE) 25 MG tablet Take  1 tablet (25 mg total) by mouth daily. 10/11/17  Yes Cherene Altes, MD  traZODone (DESYREL) 100 MG tablet Take 1 tablet (100 mg total) by mouth at bedtime. 10/10/17  Yes Cherene Altes, MD  aspirin EC 81 MG EC tablet Take 1 tablet (81 mg total) by mouth daily. Patient not taking: Reported on 01/26/2018 10/11/17   Cherene Altes, MD  insulin aspart (NOVOLOG) 100 UNIT/ML injection Inject 0-15 Units into the skin 3 (three) times daily with meals. Patient not taking: Reported on 01/26/2018 10/10/17   Cherene Altes, MD  nitroGLYCERIN (NITROSTAT) 0.4 MG SL tablet Place 1 tablet (0.4 mg total) under the tongue every 5 (five) minutes as needed (as needed for chest pain or with symptoms of esophageal spasm). Patient not taking: Reported on 01/26/2018 10/10/17   Cherene Altes, MD  OXYGEN Inhale 2.5 L into the lungs.    [provider]                                                                                                                                    Past Surgical History Past Surgical History:  Procedure Laterality Date  . APPENDECTOMY    . BACK SURGERY    . CARDIAC  CATHETERIZATION    . CERVICAL FUSION    . CORONARY ARTERY BYPASS GRAFT  2005   LIMA graft to LAD,saphenous vein graft to diag.,circumflex, marginal,and to the RCA  . CYSTOSCOPY/RETROGRADE/URETEROSCOPY/STONE EXTRACTION WITH BASKET Left 04/16/2014   Procedure: CYSTOSCOPY/RETROGRADE/LEFT URETEROSCOPY/STONE EXTRACTION WITH BASKET AND LASER LITHOTRIPSY;  Surgeon: Malka So, MD;  Location: [facility];  Service: Urology;  Laterality: Left;  With STENT  . ESOPHAGOGASTRODUODENOSCOPY  02/10/2012   Procedure: ESOPHAGOGASTRODUODENOSCOPY (EGD);  Surgeon: Cleotis Nipper, MD;  Location[facility];  Service: Endoscopy;  Laterality: N/A;  . ESOPHAGOGASTRODUODENOSCOPY  06/29/2012   Procedure: ESOPHAGOGASTRODUODENOSCOPY (EGD);  Surgeon: Winfield Cunas., MD;  Location: [facility] ENDOS[facility];  Service: Endoscopy;  Laterality: N/A;  . FOREIGN BODY REMOVAL  02/10/2012   Procedure: FOREIGN BODY REMOVAL;  Surgeon: Cleotis Nipper, MD;  Location: [place];  Service: Endoscopy;  Laterality: N/A;  . FOREIGN BODY REMOVAL  06/29/2012   Procedure: FOREIGN BODY REMOVAL;  Surgeon: Winfield Cunas., MD;  Location: [facility] ENDOSCOPY;  Service: Endoscopy;  Laterality: N/A;  . LEFT HEART CATHETERIZATION WITH CORONARY ANGIOGRAM N/A 04/02/2012   Procedure: LEFT HEART CATHETERIZATION WITH CORONARY ANGIOGRAM;  Surgeon: Sinclair Grooms, MD;  Location: [facility] C[facility] LAB;  Service: Cardiovascular;  Laterality: N/A;  . LOOP RECORDER IMPLANT N/A 11/17/2013   Procedure: LOOP RECORDER IMPLANT;  Surgeon: Deboraha Sprang, MD;  Location[facility] C[facility] LAB;  Service: Cardiovascular;  Laterality: N/A;  . ORCHIECTOMY    . PARTIAL COLECTOMY     for diverticuli  . SAVORY DILATION  06/29/2012   Procedure: SAVORY DILATION;  Surgeon: Winfield Cunas., MD;  Location: [facility] ENDOSCOPY;  Service: Endoscopy;  Laterality: N/A;  . TEE WITHOUT CARDIOVERSION N/A 11/17/2013   Procedure: TRANSESOPHAGEAL ECHOCARDIOGRAM (TEE);  Surgeon: Larey Dresser, MD;  Location: [facility] ENDOSCOPY;   Service: Cardiovascular;  Laterality: N/A;  . UMBILICAL HERNIA REPAIR     Family History Family History  Problem Relation Age of Onset  . Emphysema Mother        was a smoker  . Asthma Mother   . Hypertension Mother   . Heart disease Father   . Prostate cancer Paternal Grandfather   . Pancreatic cancer Paternal Uncle   . Stroke Paternal Grandmother   . Heart attack Neg Hx     Social History Social History   Tobacco Use  . Smoking status: Never Smoker  . Smokeless tobacco: Never Used  Substance Use Topics  . Alcohol use: No    Comment: pt states "I used to but not anymore"  . Drug use: No   Allergies Cephalexin; Lac bovis; Other; Pentazocine; Tizanidine; Cephalexin; Doxycycline; Pentazocine lactate; Tape; and Zanaflex [tizanidine hcl]  Review of Systems Review of Systems All other systems are reviewed and are negative for acute change except as noted in the HPI  Physical Exam Vital Signs  I have reviewed the triage vital signs BP 115/76   Pulse 92   Temp 98.7 F (37.1 C) (Oral)   Resp (!) 23   Ht _0  (1.702 m)   Wt 86.5 kg (190 lb 11.2 oz)   SpO2 98%   BMI 29.87 kg/m   Physical Exam  Constitutional: He is oriented to person, place, and time. He appears well-developed and well-nourished. No distress.  Chronically ill-appearing  HENT:  Head: Normocephalic and atraumatic.  Nose: Nose normal.  Eyes: Pupils are equal, round, and reactive to light. Conjunctivae and EOM are normal. Right eye exhibits no discharge. Left eye exhibits no discharge. No scleral icterus.  Neck: Normal range of motion. Neck supple.  Cardiovascular: Normal rate and regular rhythm. Exam reveals no gallop and no friction rub.  No murmur heard. Pulmonary/Chest: Effort normal and breath sounds normal. No stridor. No respiratory distress. He has no rales.    Abdominal: Soft. He exhibits no distension. There is no tenderness.  Musculoskeletal: He exhibits no edema or tenderness.    Neurological: He is alert and oriented to person, place, and time.  Skin: Skin is warm and dry. No rash noted. He is not diaphoretic. No erythema.  Psychiatric: He has a normal mood and affect.  Vitals reviewed.   ED Results and Treatments Labs (all labs ordered are listed, but only abnormal results are displayed) Labs Reviewed  BASIC METABOLIC PANEL - Abnormal; Notable for the following components:      Result Value   Glucose, Bld 181 (*)    BUN 22 (*)    All other components within normal limits  CBC - Abnormal; Notable for the following components:   RBC 3.53 (*)    Hemoglobin 11.0 (*)    HCT 34.2 (*)    All other components within normal limits  I-STAT TROPONIN, ED - Abnormal; Notable for the following components:   Troponin i, poc 0.34 (*)    All other components within normal limits  HEPATIC FUNCTION PANEL  BRAIN NATRIURETIC PEPTIDE  BASIC METABOLIC PANEL  CBC  TROPONIN I  TROPONIN I  TROPONIN I  EKG  EKG Interpretation  Date/Time:  Monday January 26 2018 22:54:02 EDT Ventricular Rate:  107 PR Interval:    QRS Duration: 96 QT Interval:  325 QTC Calculation: 434 R Axis:   71 Text Interpretation:  Sinus tachycardia Left atrial enlargement Repol abnrm, severe global ischemia (LM/MVD) Artifact in lead(s) I aVL V1 STE in aVR and diffuse STD more pronounced than previous tracing. Confirmed by Addison Lank [phone]) on 01/27/2018 12:18:59 AM      Radiology Dg Chest Portable 1 View  Result Date: 01/26/2018 CLINICAL DATA:  Acute onset of generalized chest pain, shortness of breath and nausea. EXAM: PORTABLE CHEST 1 VIEW COMPARISON:  Chest radiograph performed 10/09/2017 FINDINGS: The lungs are well-aerated. Vascular congestion is noted. Increased interstitial markings may reflect mild interstitial edema or possibly pneumonia. There is no evidence of pleural  effusion or pneumothorax. The cardiomediastinal silhouette is normal in size. The patient is status post median sternotomy, with evidence of prior CABG. No acute osseous abnormalities are seen. IMPRESSION: Vascular congestion noted. Increased interstitial markings may reflect mild interstitial edema or possibly pneumonia. Electronically Signed   By: Garald Balding M.D.   On: 01/26/2018 23:46   Pertinent labs & imaging results that were available during my care of the patient were reviewed by me and considered in my medical decision making (see chart for details).  Medications Ordered in ED Medications  sodium chloride 0.9 % bolus 500 mL (500 mLs Intravenous New Bag/Given 01/26/18 2346)  insulin aspart (novoLOG) injection 0-15 Units (has no administration in time range)  atorvastatin (LIPITOR) tablet 40 mg (has no administration in time range)  donepezil (ARICEPT) tablet 10 mg (has no administration in time range)  FLUoxetine (PROZAC) capsule 40 mg (has no administration in time range)  furosemide (LASIX) tablet 60 mg (has no administration in time range)  gabapentin (NEURONTIN) capsule 100-300 mg (has no administration in time range)  gabapentin (NEURONTIN) capsule 100 mg (has no administration in time range)  ipratropium-albuterol (DUONEB) 0.5-2.5 (3) MG/3ML nebulizer solution 3 mL (has no administration in time range)  lamoTRIgine (LAMICTAL) tablet 100 mg (has no administration in time range)  Melatonin TABS 3 mg (has no administration in time range)  polycarbophil (FIBERCON) tablet 625 mg (has no administration in time range)  famotidine (PEPCID) tablet 10 mg (has no administration in time range)  spironolactone (ALDACTONE) tablet 25 mg (has no administration in time range)  traZODone (DESYREL) tablet 100 mg (has no administration in time range)  aspirin EC tablet 81 mg (has no administration in time range)  nitroGLYCERIN (NITROSTAT) SL tablet 0.4 mg (has no administration in time range)    acetaminophen (TYLENOL) tablet 650 mg (has no administration in time range)  ondansetron (ZOFRAN) injection 4 mg (has no administration in time range)  nitroGLYCERIN 50 mg in dextrose 5 % 250 mL (0.2 mg/mL) infusion (5 mcg/min Intravenous New Bag/Given 01/27/18 0030)  fentaNYL (SUBLIMAZE) injection 50 mcg (50 mcg Intravenous Given 01/26/18 2346)  ondansetron (ZOFRAN) injection 4 mg (4 mg Intravenous Given 01/26/18 2346)  nitroGLYCERIN 50 mg in dextrose 5 % 250 mL (0.2 mg/mL) infusion (0 mcg/min Intravenous Stopped 01/27/18 0021)  Procedures Procedures CRITICAL CARE Performed by: Grayce Sessions Cardama Total critical care time: 30 minutes Critical care time was exclusive of separately billable procedures and treating other patients. Critical care was necessary to treat or prevent imminent or life-threatening deterioration. Critical care was time spent personally by me on the following activities: development of treatment plan with patient and/or surrogate as well as nursing, discussions with consultants, evaluation of patient's response to treatment, examination of patient, obtaining history from patient or surrogate, ordering and performing treatments and interventions, ordering and review of laboratory studies, ordering and review of radiographic studies, pulse oximetry and re-evaluation of patient's condition.   (including critical care time)  Medical Decision Making / ED Course I have reviewed the nursing notes for this encounter and the patient's prior records (if available in EHR or on provided paperwork).    EKG concerning for possible left main ST segment elevation MI.  Patient has had similar EKG changes in the past but these are more pronounced.  Chest pain is atypical.  I discussed this with Dr. Claiborne Billings who is on STEMI call who evaluated the EKG and did not feel it  met STEMI criteria.  Troponin elevated at 0.34.  Chest x-ray with mild evidence of mild pulmonary edema. BNP ordered.   Presentation not classic for aortic dissection or esophageal perforation.  Low suspicion for pulmonary embolism.  Patient started on nitroglycerin drip. Heparin gtt held due to Xarelto dosing.  Cardiology consulted who evaluated the patient in the emergency department and will admit.  Final Clinical Impression(s) / ED Diagnoses Final diagnoses:  NSTEMI (non-ST elevated myocardial infarction) [facility])      This chart was dictated using voice recognition software.  Despite best efforts to proofread,  errors can occur which can change the documentation meaning.     Fatima Blank, MD 01/27/18 [phone]

## 2018-01-26 NOTE — ED Notes (Signed)
Date and time results received: 01/26/18 11:11 PM  Test: I-Stat Troponin Critical Value: 0.34  Name of Provider Notified: Addison Lank  Orders Received? Or Actions Taken?: no new orders

## 2018-01-27 ENCOUNTER — Inpatient Hospital Stay (HOSPITAL_COMMUNITY): Payer: Medicare Other

## 2018-01-27 DIAGNOSIS — Z7401 Bed confinement status: Secondary | ICD-10-CM | POA: Diagnosis not present

## 2018-01-27 DIAGNOSIS — I25810 Atherosclerosis of coronary artery bypass graft(s) without angina pectoris: Secondary | ICD-10-CM | POA: Diagnosis present

## 2018-01-27 DIAGNOSIS — E785 Hyperlipidemia, unspecified: Secondary | ICD-10-CM | POA: Diagnosis present

## 2018-01-27 DIAGNOSIS — I249 Acute ischemic heart disease, unspecified: Secondary | ICD-10-CM | POA: Diagnosis present

## 2018-01-27 DIAGNOSIS — I952 Hypotension due to drugs: Secondary | ICD-10-CM | POA: Diagnosis not present

## 2018-01-27 DIAGNOSIS — I69359 Hemiplegia and hemiparesis following cerebral infarction affecting unspecified side: Secondary | ICD-10-CM | POA: Diagnosis not present

## 2018-01-27 DIAGNOSIS — F0390 Unspecified dementia without behavioral disturbance: Secondary | ICD-10-CM | POA: Diagnosis present

## 2018-01-27 DIAGNOSIS — I361 Nonrheumatic tricuspid (valve) insufficiency: Secondary | ICD-10-CM | POA: Diagnosis not present

## 2018-01-27 DIAGNOSIS — E7800 Pure hypercholesterolemia, unspecified: Secondary | ICD-10-CM

## 2018-01-27 DIAGNOSIS — R066 Hiccough: Secondary | ICD-10-CM | POA: Diagnosis present

## 2018-01-27 DIAGNOSIS — G4733 Obstructive sleep apnea (adult) (pediatric): Secondary | ICD-10-CM | POA: Diagnosis present

## 2018-01-27 DIAGNOSIS — K224 Dyskinesia of esophagus: Secondary | ICD-10-CM | POA: Diagnosis present

## 2018-01-27 DIAGNOSIS — K219 Gastro-esophageal reflux disease without esophagitis: Secondary | ICD-10-CM | POA: Diagnosis present

## 2018-01-27 DIAGNOSIS — R0902 Hypoxemia: Secondary | ICD-10-CM | POA: Diagnosis not present

## 2018-01-27 DIAGNOSIS — R197 Diarrhea, unspecified: Secondary | ICD-10-CM | POA: Diagnosis not present

## 2018-01-27 DIAGNOSIS — Z7901 Long term (current) use of anticoagulants: Secondary | ICD-10-CM | POA: Diagnosis not present

## 2018-01-27 DIAGNOSIS — T17908S Unspecified foreign body in respiratory tract, part unspecified causing other injury, sequela: Secondary | ICD-10-CM | POA: Diagnosis not present

## 2018-01-27 DIAGNOSIS — R05 Cough: Secondary | ICD-10-CM | POA: Diagnosis not present

## 2018-01-27 DIAGNOSIS — I2510 Atherosclerotic heart disease of native coronary artery without angina pectoris: Secondary | ICD-10-CM | POA: Diagnosis not present

## 2018-01-27 DIAGNOSIS — Z951 Presence of aortocoronary bypass graft: Secondary | ICD-10-CM | POA: Diagnosis not present

## 2018-01-27 DIAGNOSIS — I5043 Acute on chronic combined systolic (congestive) and diastolic (congestive) heart failure: Secondary | ICD-10-CM

## 2018-01-27 DIAGNOSIS — I252 Old myocardial infarction: Secondary | ICD-10-CM | POA: Diagnosis not present

## 2018-01-27 DIAGNOSIS — E119 Type 2 diabetes mellitus without complications: Secondary | ICD-10-CM | POA: Diagnosis present

## 2018-01-27 DIAGNOSIS — I214 Non-ST elevation (NSTEMI) myocardial infarction: Secondary | ICD-10-CM | POA: Diagnosis not present

## 2018-01-27 DIAGNOSIS — J811 Chronic pulmonary edema: Secondary | ICD-10-CM | POA: Diagnosis not present

## 2018-01-27 DIAGNOSIS — M2550 Pain in unspecified joint: Secondary | ICD-10-CM | POA: Diagnosis not present

## 2018-01-27 DIAGNOSIS — T463X5A Adverse effect of coronary vasodilators, initial encounter: Secondary | ICD-10-CM | POA: Diagnosis not present

## 2018-01-27 DIAGNOSIS — I69328 Other speech and language deficits following cerebral infarction: Secondary | ICD-10-CM | POA: Diagnosis not present

## 2018-01-27 DIAGNOSIS — R1314 Dysphagia, pharyngoesophageal phase: Secondary | ICD-10-CM | POA: Diagnosis present

## 2018-01-27 DIAGNOSIS — R1310 Dysphagia, unspecified: Secondary | ICD-10-CM | POA: Diagnosis not present

## 2018-01-27 DIAGNOSIS — R112 Nausea with vomiting, unspecified: Secondary | ICD-10-CM | POA: Diagnosis not present

## 2018-01-27 DIAGNOSIS — I110 Hypertensive heart disease with heart failure: Secondary | ICD-10-CM | POA: Diagnosis present

## 2018-01-27 DIAGNOSIS — H9190 Unspecified hearing loss, unspecified ear: Secondary | ICD-10-CM | POA: Diagnosis present

## 2018-01-27 DIAGNOSIS — R509 Fever, unspecified: Secondary | ICD-10-CM | POA: Diagnosis not present

## 2018-01-27 LAB — BASIC METABOLIC PANEL
ANION GAP: 10 (ref 5–15)
BUN: 18 mg/dL (ref 6–20)
CHLORIDE: 102 mmol/L (ref 101–111)
CO2: 29 mmol/L (ref 22–32)
Calcium: 8.9 mg/dL (ref 8.9–10.3)
Creatinine, Ser: 1.13 mg/dL (ref 0.61–1.24)
GFR calc Af Amer: >60 mL/min (ref >60)
GLUCOSE: 137 mg/dL — AB (ref 65–99)
POTASSIUM: 4.1 mmol/L (ref 3.5–5.1)
Sodium: 141 mmol/L (ref 135–145)

## 2018-01-27 LAB — CBC
HEMATOCRIT: 34.2 % — AB (ref 39.0–52.0)
HEMATOCRIT: 30.2 % — AB (ref 39.0–52.0)
HEMOGLOBIN: 9.6 g/dL — AB (ref 13.0–17.0)
Hemoglobin: 10.8 g/dL — ABNORMAL LOW (ref 13.0–17.0)
MCH: 30.9 pg (ref 26.0–34.0)
MCH: 30.8 pg (ref 26.0–34.0)
MCHC: 31.6 g/dL (ref 30.0–36.0)
MCHC: 31.8 g/dL (ref 30.0–36.0)
MCV: 98 fL (ref 78.0–100.0)
MCV: 96.8 fL (ref 78.0–100.0)
Platelets: 268 10*3/uL (ref 150–400)
Platelets: 233 10*3/uL (ref 150–400)
RBC: 3.49 MIL/uL — ABNORMAL LOW (ref 4.22–5.81)
RBC: 3.12 MIL/uL — AB (ref 4.22–5.81)
RDW: 14.3 % (ref 11.5–15.5)
RDW: 14.6 % (ref 11.5–15.5)
WBC: 11.6 10*3/uL — AB (ref 4.0–10.5)
WBC: 9.3 10*3/uL (ref 4.0–10.5)

## 2018-01-27 LAB — TROPONIN I
TROPONIN I: 5.14 ng/mL — AB (ref <0.03)
Troponin I: 3.04 ng/mL (ref <0.03)

## 2018-01-27 LAB — BRAIN NATRIURETIC PEPTIDE: B NATRIURETIC PEPTIDE 5: 303.2 pg/mL — AB (ref 0.0–100.0)

## 2018-01-27 LAB — HEPATIC FUNCTION PANEL
ALT: 17 U/L (ref 17–63)
AST: 22 U/L (ref 15–41)
Albumin: 3.4 g/dL — ABNORMAL LOW (ref 3.5–5.0)
Alkaline Phosphatase: 74 U/L (ref 38–126)
Total Bilirubin: 0.4 mg/dL (ref 0.3–1.2)
Total Protein: 6.8 g/dL (ref 6.5–8.1)

## 2018-01-27 LAB — GLUCOSE, CAPILLARY
GLUCOSE-CAPILLARY: 125 mg/dL — AB (ref 65–99)
Glucose-Capillary: 129 mg/dL — ABNORMAL HIGH (ref 65–99)
Glucose-Capillary: 100 mg/dL — ABNORMAL HIGH (ref 65–99)
Glucose-Capillary: 148 mg/dL — ABNORMAL HIGH (ref 65–99)

## 2018-01-27 MED ORDER — DONEPEZIL HCL 10 MG PO TABS
10.0000 mg | ORAL_TABLET | Freq: Every day | ORAL | Status: DC
  Administered 2018-01-28 – 2018-01-31 (×4): 10 mg via ORAL
  Filled 2018-01-27: qty 2
  Filled 2018-01-27 (×5): qty 1

## 2018-01-27 MED ORDER — CALCIUM POLYCARBOPHIL 625 MG PO TABS
625.0000 mg | ORAL_TABLET | Freq: Two times a day (BID) | ORAL | Status: DC
  Administered 2018-01-27 – 2018-01-31 (×9): 625 mg via ORAL
  Filled 2018-01-27 (×13): qty 1

## 2018-01-27 MED ORDER — SODIUM CHLORIDE 0.9% FLUSH
3.0000 mL | INTRAVENOUS | Status: DC | PRN
  Administered 2018-01-27 – 2018-01-28 (×2): 3 mL via INTRAVENOUS
  Filled 2018-01-27 (×2): qty 3

## 2018-01-27 MED ORDER — ASPIRIN 81 MG PO CHEW: 81.0000 mg | CHEWABLE_TABLET | ORAL | Status: DC

## 2018-01-27 MED ORDER — FUROSEMIDE 40 MG PO TABS: 60.0000 mg | ORAL_TABLET | Freq: Every day | ORAL | Status: DC

## 2018-01-27 MED ORDER — FUROSEMIDE 10 MG/ML IJ SOLN
80.0000 mg | Freq: Two times a day (BID) | INTRAMUSCULAR | Status: DC
  Administered 2018-01-27 – 2018-01-28 (×2): 80 mg via INTRAVENOUS
  Filled 2018-01-27 (×3): qty 8

## 2018-01-27 MED ORDER — SODIUM CHLORIDE 0.9% FLUSH
3.0000 mL | Freq: Two times a day (BID) | INTRAVENOUS | Status: DC
  Administered 2018-01-27 (×2): 3 mL via INTRAVENOUS

## 2018-01-27 MED ORDER — TRAZODONE HCL 100 MG PO TABS
100.0000 mg | ORAL_TABLET | Freq: Every day | ORAL | Status: DC
  Administered 2018-01-27 – 2018-01-30 (×5): 100 mg via ORAL
  Filled 2018-01-27: qty 1
  Filled 2018-01-27 (×2): qty 2
  Filled 2018-01-27 (×2): qty 1

## 2018-01-27 MED ORDER — IPRATROPIUM-ALBUTEROL 0.5-2.5 (3) MG/3ML IN SOLN: 3.0000 mL | RESPIRATORY_TRACT | Status: DC | PRN

## 2018-01-27 MED ORDER — INSULIN ASPART 100 UNIT/ML ~~LOC~~ SOLN
0.0000 [IU] | Freq: Three times a day (TID) | SUBCUTANEOUS | Status: DC
  Administered 2018-01-27 (×2): 2 [IU] via SUBCUTANEOUS
  Administered 2018-01-28: 3 [IU] via SUBCUTANEOUS
  Administered 2018-01-28 – 2018-01-30 (×5): 2 [IU] via SUBCUTANEOUS
  Administered 2018-01-30: 5 [IU] via SUBCUTANEOUS
  Administered 2018-01-30: 3 [IU] via SUBCUTANEOUS
  Administered 2018-01-31: 2 [IU] via SUBCUTANEOUS

## 2018-01-27 MED ORDER — MELATONIN 3 MG PO TABS
3.0000 mg | ORAL_TABLET | Freq: Every day | ORAL | Status: DC
  Administered 2018-01-27 – 2018-01-30 (×5): 3 mg via ORAL
  Filled 2018-01-27 (×6): qty 1

## 2018-01-27 MED ORDER — ASPIRIN 81 MG PO TBEC: 81.0000 mg | DELAYED_RELEASE_TABLET | Freq: Every day | ORAL | Status: DC

## 2018-01-27 MED ORDER — INSULIN ASPART 100 UNIT/ML ~~LOC~~ SOLN: 0.0000 [IU] | Freq: Three times a day (TID) | SUBCUTANEOUS | Status: DC

## 2018-01-27 MED ORDER — ATORVASTATIN CALCIUM 80 MG PO TABS
80.0000 mg | ORAL_TABLET | Freq: Every day | ORAL | Status: DC
  Administered 2018-01-28 – 2018-01-30 (×3): 80 mg via ORAL
  Filled 2018-01-27 (×3): qty 1

## 2018-01-27 MED ORDER — ACETAMINOPHEN 325 MG PO TABS: 650.0000 mg | ORAL_TABLET | ORAL | Status: DC | PRN

## 2018-01-27 MED ORDER — FAMOTIDINE 20 MG PO TABS
10.0000 mg | ORAL_TABLET | Freq: Every day | ORAL | Status: DC
  Administered 2018-01-28 – 2018-01-30 (×3): 10 mg via ORAL
  Filled 2018-01-27 (×3): qty 1

## 2018-01-27 MED ORDER — ORAL CARE MOUTH RINSE
15.0000 mL | Freq: Two times a day (BID) | OROMUCOSAL | Status: DC
  Administered 2018-01-27: 15 mL via OROMUCOSAL

## 2018-01-27 MED ORDER — SPIRONOLACTONE 25 MG PO TABS
25.0000 mg | ORAL_TABLET | Freq: Every day | ORAL | Status: DC
  Administered 2018-01-27 – 2018-01-31 (×5): 25 mg via ORAL
  Filled 2018-01-27 (×6): qty 1

## 2018-01-27 MED ORDER — ASPIRIN EC 81 MG PO TBEC
81.0000 mg | DELAYED_RELEASE_TABLET | Freq: Every day | ORAL | Status: DC
  Administered 2018-01-27 – 2018-01-31 (×5): 81 mg via ORAL
  Filled 2018-01-27 (×5): qty 1

## 2018-01-27 MED ORDER — SODIUM CHLORIDE 0.9 % IV SOLN: 250.0000 mL | INTRAVENOUS | Status: DC | PRN

## 2018-01-27 MED ORDER — IPRATROPIUM-ALBUTEROL 0.5-2.5 (3) MG/3ML IN SOLN
3.0000 mL | Freq: Two times a day (BID) | RESPIRATORY_TRACT | Status: DC
  Administered 2018-01-27 (×2): 3 mL via RESPIRATORY_TRACT
  Filled 2018-01-27 (×2): qty 3

## 2018-01-27 MED ORDER — PROMETHAZINE HCL 25 MG/ML IJ SOLN: 12.5000 mg | Freq: Four times a day (QID) | INTRAMUSCULAR | Status: DC | PRN

## 2018-01-27 MED ORDER — FLUOXETINE HCL 20 MG PO CAPS
40.0000 mg | ORAL_CAPSULE | Freq: Every day | ORAL | Status: DC
  Administered 2018-01-28 – 2018-01-31 (×4): 40 mg via ORAL
  Filled 2018-01-27 (×5): qty 2

## 2018-01-27 MED ORDER — ATORVASTATIN CALCIUM 40 MG PO TABS: 40.0000 mg | ORAL_TABLET | Freq: Every day | ORAL | Status: DC

## 2018-01-27 MED ORDER — NITROGLYCERIN IN D5W 200-5 MCG/ML-% IV SOLN
0.0000 ug/min | INTRAVENOUS | Status: DC
  Administered 2018-01-27: 5 ug/min via INTRAVENOUS

## 2018-01-27 MED ORDER — NITROGLYCERIN 0.4 MG SL SUBL
0.4000 mg | SUBLINGUAL_TABLET | SUBLINGUAL | Status: DC | PRN
  Administered 2018-01-27 – 2018-01-29 (×2): 0.4 mg via SUBLINGUAL
  Filled 2018-01-27 (×2): qty 1

## 2018-01-27 MED ORDER — ONDANSETRON HCL 4 MG/2ML IJ SOLN
4.0000 mg | Freq: Once | INTRAMUSCULAR | Status: AC
  Administered 2018-01-27: 4 mg via INTRAVENOUS
  Filled 2018-01-27: qty 2

## 2018-01-27 MED ORDER — ONDANSETRON HCL 4 MG/2ML IJ SOLN
4.0000 mg | Freq: Four times a day (QID) | INTRAMUSCULAR | Status: DC | PRN
  Administered 2018-01-27 (×2): 4 mg via INTRAVENOUS
  Filled 2018-01-27 (×2): qty 2

## 2018-01-27 MED ORDER — POLYETHYLENE GLYCOL 3350 17 G PO PACK: 17.0000 g | PACK | Freq: Every day | ORAL | Status: DC

## 2018-01-27 MED ORDER — GABAPENTIN 100 MG PO CAPS
100.0000 mg | ORAL_CAPSULE | Freq: Every day | ORAL | Status: DC
  Administered 2018-01-27 – 2018-01-30 (×5): 100 mg via ORAL
  Filled 2018-01-27 (×4): qty 1

## 2018-01-27 MED ORDER — SODIUM CHLORIDE 0.9 % IV SOLN
INTRAVENOUS | Status: DC
  Administered 2018-01-27: 14:00:00 via INTRAVENOUS

## 2018-01-27 MED ORDER — MORPHINE SULFATE (PF) 2 MG/ML IV SOLN
1.0000 mg | INTRAVENOUS | Status: DC | PRN
  Administered 2018-01-27 – 2018-01-28 (×6): 1 mg via INTRAVENOUS
  Filled 2018-01-27 (×6): qty 1

## 2018-01-27 MED ORDER — LAMOTRIGINE 100 MG PO TABS
100.0000 mg | ORAL_TABLET | Freq: Two times a day (BID) | ORAL | Status: DC
  Administered 2018-01-27 – 2018-01-31 (×9): 100 mg via ORAL
  Filled 2018-01-27 (×4): qty 1
  Filled 2018-01-27: qty 4
  Filled 2018-01-27 (×4): qty 1
  Filled 2018-01-27: qty 4

## 2018-01-27 MED ORDER — GABAPENTIN 100 MG PO CAPS
100.0000 mg | ORAL_CAPSULE | Freq: Every evening | ORAL | Status: DC | PRN
  Filled 2018-01-27: qty 1

## 2018-01-27 NOTE — Progress Notes (Signed)
Patient being prepped for cath lab and laid flat for groin clip. Got cyanotic to lips and face and sats dropped to 70s. Patient c/o dyspnea. Quickly resolved with sates back to high 90s upon return to semi fowlers position in bed.

## 2018-01-27 NOTE — Progress Notes (Signed)
Patient continued to have chest pain and nausea throughout shift today with some relief at times from Morphine and Zofran. No overt emesis with nausea but does spit small amounts of moderately thick sputum-tan or brown. Patient appears to hold his breath during coughing episodes resp rate increases as does obvious work of breathing. Called for Rapid Response to give second opinion and patient assessed with same findings. He is resting quietly now.

## 2018-01-27 NOTE — Progress Notes (Signed)
Called by nurse, pt when [facility] rolled down pt became cyanotic and SP02 dropped.  He continues with chest pain and SOB, + nausea.  Dr. Oval Linsey has ordered KUB and PCXR.    Neuro:  Alert and oriented. But does not feel well.   Heart S1S2 RRR     Lungs diminished and rales  Resp 18, P 94 BP 100/67  Awaiting radiology

## 2018-01-27 NOTE — H&P (Addendum)
History & Physical    Patient ID: [name] MRN: [id number], DOB/AGE: 09/01/1945   Admit date: 01/26/2018   Primary Physician: Bernell List, MD Primary Cardiologist: Peter Martinique, MD  Patient Profile    72 year old man presenting with chest pain and NSTEMI  Past Medical History    Past Medical History:  Diagnosis Date  . AKI (acute kidney injury) ([place])    a. 6/14: resolved after d/c ARB;  b. RA U/S 7/14: no RA stenosis  . Arthritis    of spine  . Atrial flutter ([place])    a. 2008 - one episode in setting of sepsis/shock.  . Back pain    persistent  . Coronary artery disease    a. s/p CABG 2005. b. low risk nuc 2016.  Marland Kitchen Depression   . Diabetes mellitus    controled by diet  . Diverticulitis    a. s/p partial colectomy remotely.  Marland Kitchen GERD (gastroesophageal reflux disease)   . H/O alcohol abuse   . Headache(784.0)   . Hearing loss   . History of hiatal hernia   . Hx of echocardiogram    Echo 7/14:  Mod LVH, EF 55-60%, Gr 1 DD, mild MR, PASP 36  . Hyperlipidemia   . Hypertension   . Pulmonary embolism ([place]) 2005  . Renal stone 04/15/2014  . Seizures ([place])   . Sleep apnea    does not use CPAP  . Stroke [facility])     Past Surgical History:  Procedure Laterality Date  . APPENDECTOMY    . BACK SURGERY    . CARDIAC CATHETERIZATION    . CERVICAL FUSION    . CORONARY ARTERY BYPASS GRAFT  2005   LIMA graft to LAD,saphenous vein graft to diag.,circumflex, marginal,and to the RCA  . CYSTOSCOPY/RETROGRADE/URETEROSCOPY/STONE EXTRACTION WITH BASKET Left 04/16/2014   Procedure: CYSTOSCOPY/RETROGRADE/LEFT URETEROSCOPY/STONE EXTRACTION WITH BASKET AND LASER LITHOTRIPSY;  Surgeon: Malka So, MD;  Location: [facility] ORS;  Service: Urology;  Laterality: Left;  With STENT  . ESOPHAGOGASTRODUODENOSCOPY  02/10/2012   Procedure: ESOPHAGOGASTRODUODENOSCOPY (EGD);  Surgeon: Cleotis Nipper, MD;  Location: [facility] ENDOSCOPY;  Service: Endoscopy;  Laterality: N/A;  . ESOPHAGOGASTRODUODENOSCOPY   06/29/2012   Procedure: ESOPHAGOGASTRODUODENOSCOPY (EGD);  Surgeon: Winfield Cunas., MD;  Location: [facility] ENDOSCOPY;  Service: Endoscopy;  Laterality: N/A;  . FOREIGN BODY REMOVAL  02/10/2012   Procedure: FOREIGN BODY REMOVAL;  Surgeon: Cleotis Nipper, MD;  Location: [place];  Service: Endoscopy;  Laterality: N/A;  . FOREIGN BODY REMOVAL  06/29/2012   Procedure: FOREIGN BODY REMOVAL;  Surgeon: Winfield Cunas., MD;  Location: [facility] ENDOSCOPY;  Service: Endoscopy;  Laterality: N/A;  . LEFT HEART CATHETERIZATION WITH CORONARY ANGIOGRAM N/A 04/02/2012   Procedure: LEFT HEART CATHETERIZATION WITH CORONARY ANGIOGRAM;  Surgeon: Sinclair Grooms, MD;  Location: [facility] CATH LAB;  Service: Cardiovascular;  Laterality: N/A;  . LOOP RECORDER IMPLANT N/A 11/17/2013   Procedure: LOOP RECORDER IMPLANT;  Surgeon: Deboraha Sprang, MD;  Location: [facility] CATH LAB;  Service: Cardiovascular;  Laterality: N/A;  . ORCHIECTOMY    . PARTIAL COLECTOMY     for diverticuli  . SAVORY DILATION  06/29/2012   Procedure: SAVORY DILATION;  Surgeon: Winfield Cunas., MD;  Location: [facility] ENDOSCOPY;  Service: Endoscopy;  Laterality: N/A;  . TEE WITHOUT CARDIOVERSION N/A 11/17/2013   Procedure: TRANSESOPHAGEAL ECHOCARDIOGRAM (TEE);  Surgeon: Larey Dresser, MD;  Location: [place];  Service: Cardiovascular;  Laterality: N/A;  . UMBILICAL HERNIA REPAIR  Allergies  Allergies  Allergen Reactions  . Cephalexin     Other reaction(s): Unknown  . Lac Bovis Other (See Comments)    lactose intolerant Other reaction(s): Other lactose intolerant  . Other     Other reaction(s): Other Paper tape only please.  Marland Kitchen Pentazocine Other (See Comments)    hallucinations Other reaction(s): Other hallucinations  . Tizanidine     Other reaction(s): Other Lightheaded and dizzy  . Cephalexin     Unknown  . Doxycycline     Unknown  . Pentazocine Lactate     He passed out- he had a seizure.  This occurred around 2000  . Tape Other  (See Comments)    Paper tape only please.  . Zanaflex [Tizanidine Hcl] Other (See Comments)    Lightheaded and dizzy    History of Present Illness    2 hours ago he had sudden onset of chest pain while at home.  He has known past medical history of CAD with a CABG 12 years ago, four-vessel.  No catheterization since then.  Has known reduced LVEF.  Last one was documented to be in the 40% range as low as 30% prior to that.  He had a prolonged hospital stay few months ago.  Noted to have an NSTEMI in that setting given his complicated comorbid state and severe deconditioning the plan was to hold off catheterization till after rehab.  Other medical problems include sleep apnea history of stroke history of dementia ethanol abuse and diabetes.  None of these history appeared to be a aching acute issue at this time point.  On my examination patient reports 5 out of 10 chest pain. nitroglycerin was not given due to concern for low range blood pressures in the 45Y to 099 systolic  Home Medications    Prior to Admission medications   Medication Sig Start Date End Date Taking? Authorizing Provider  acetaminophen (TYLENOL) 325 MG tablet Take 2 tablets (650 mg total) by mouth every 4 (four) hours as needed for mild pain, fever or headache. 10/10/17  Yes Cherene Altes, MD  apixaban (ELIQUIS) 5 MG TABS tablet Take 1 tablet (5 mg total) by mouth 2 (two) times daily. 10/10/17  Yes Cherene Altes, MD  atorvastatin (LIPITOR) 40 MG tablet Take 1 tablet (40 mg total) by mouth daily at 6 PM. 10/10/17  Yes Cherene Altes, MD  carvedilol (COREG) 3.125 MG tablet Take 1 tablet (3.125 mg total) by mouth 2 (two) times daily with a meal. 10/10/17  Yes Cherene Altes, MD  donepezil (ARICEPT) 10 MG tablet Take 1 tablet (10 mg total) by mouth daily. Patient taking differently: Take 10 mg by mouth every evening.  10/11/17  Yes Cherene Altes, MD  Fluocinolone Acetonide 0.01 % OIL Apply 1 application  topically daily as needed (itching). Apply to scalp   Yes [provider]  FLUoxetine (PROZAC) 40 MG capsule Take 1 capsule (40 mg total) by mouth daily. 10/11/17  Yes Cherene Altes, MD  fluticasone (FLONASE) 50 MCG/ACT nasal spray Place 1 spray into both nostrils 2 (two) times daily as needed for rhinitis. 10/10/17  Yes Cherene Altes, MD  furosemide (LASIX) 20 MG tablet Take 60 mg by mouth daily.   Yes [provider]  gabapentin (NEURONTIN) 100 MG capsule Take 100 mg by mouth at bedtime.    Yes [provider]  gabapentin (NEURONTIN) 100 MG capsule Take 100-300 mg by mouth at bedtime as needed (for itching).  Yes [provider]  hydroxypropyl methylcellulose / hypromellose (ISOPTO TEARS / GONIOVISC) 2.5 % ophthalmic solution Place 1 drop into both eyes 2 (two) times daily as needed for dry eyes.   Yes [provider]  hyoscyamine (LEVSIN SL) 0.125 MG SL tablet Place 0.125 mg under the tongue every 4 (four) hours as needed for cramping.    Yes [provider]  ipratropium-albuterol (DUONEB) 0.5-2.5 (3) MG/3ML SOLN Take 3 mLs by nebulization 2 (two) times daily.  12/10/17  Yes [provider]  lamoTRIgine (LAMICTAL) 100 MG tablet Take 1 tablet (100 mg total) by mouth 2 (two) times daily. 10/10/17  Yes Cherene Altes, MD  losartan (COZAAR) 25 MG tablet Take 12.5 mg by mouth daily.   Yes [provider]  Melatonin 3 MG TABS Take 3 mg by mouth at bedtime.    Yes [provider]  Multiple Vitamin (MULTIVITAMIN WITH MINERALS) TABS tablet Take 1 tablet by mouth daily.   Yes [provider]  potassium chloride (K-DUR) 10 MEQ tablet Take 10 mEq by mouth daily.  01/03/18  Yes [provider]  psyllium (REGULOID) 0.52 g capsule Take 0.52 g by mouth 2 (two) times daily.   Yes [provider]  ranitidine (ZANTAC) 150 MG tablet Take 150 mg by mouth daily.  01/03/18  Yes [provider]    spironolactone (ALDACTONE) 25 MG tablet Take 1 tablet (25 mg total) by mouth daily. 10/11/17  Yes Cherene Altes, MD  traZODone (DESYREL) 100 MG tablet Take 1 tablet (100 mg total) by mouth at bedtime. 10/10/17  Yes Cherene Altes, MD  aspirin EC 81 MG EC tablet Take 1 tablet (81 mg total) by mouth daily. Patient not taking: Reported on 01/26/2018 10/11/17   Cherene Altes, MD  insulin aspart (NOVOLOG) 100 UNIT/ML injection Inject 0-15 Units into the skin 3 (three) times daily with meals. Patient not taking: Reported on 01/26/2018 10/10/17   Cherene Altes, MD  nitroGLYCERIN (NITROSTAT) 0.4 MG SL tablet Place 1 tablet (0.4 mg total) under the tongue every 5 (five) minutes as needed (as needed for chest pain or with symptoms of esophageal spasm). Patient not taking: Reported on 01/26/2018 10/10/17   Cherene Altes, MD  OXYGEN Inhale 2.5 L into the lungs.    [provider]    Family History    Family History  Problem Relation Age of Onset  . Emphysema Mother        was a smoker  . Asthma Mother   . Hypertension Mother   . Heart disease Father   . Prostate cancer Paternal Grandfather   . Pancreatic cancer Paternal Uncle   . Stroke Paternal Grandmother   . Heart attack Neg Hx    indicated that his mother is deceased. He indicated that his father is deceased. He indicated that his sister is alive. He indicated that the status of his paternal grandmother is unknown. He indicated that the status of his paternal grandfather is unknown. He indicated that his daughter is deceased. He indicated that the status of his paternal uncle is unknown. He indicated that the status of his neg hx is unknown.   Social History    Social History   Socioeconomic History  . Marital status: Married    Spouse name: Clinical research associate  . Number of children: 2  . Years of education: college  . Highest education level: Not on file  Occupational History  . 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 Financial resource strain: Not on file  . Food insecurity:    Worry: Not on file    Inability: Not on file  . Transportation needs:    Medical: Not on file    Non-medical: Not on file  Tobacco Use  . Smoking status: Never Smoker  . Smokeless tobacco: Never Used  Substance and Sexual Activity  . Alcohol use: No    Comment: pt states "I used to but not anymore"  . Drug use: No  . Sexual activity: Not Currently  Lifestyle  . Physical activity:    Days per week: Not on file    Minutes per session: Not on file  . Stress: Not on file  Relationships  . Social connections:    Talks on phone: Not on file    Gets together: Not on file    Attends religious service: Not on file    Active member of club or organization: Not on file    Attends meetings of clubs or organizations: Not on file    Relationship status: Not on file  . Intimate partner violence:    Fear of current or ex partner: Not on file    Emotionally abused: Not on file    Physically abused: Not on file    Forced sexual activity: Not on file  Other Topics Concern  . Not on file  Social History Narrative   Patient lives at home with  wife      Patient drinks 2 cups of coffee a day.patient is left handed     Review of Systems    General:  No chills, fever, night sweats or weight changes.  Cardiovascular:  No chest pain, dyspnea on exertion, edema, orthopnea, palpitations, paroxysmal nocturnal dyspnea. Dermatological: No rash, lesions/masses Respiratory: No cough, dyspnea Urologic: No hematuria, dysuria Abdominal:   No nausea, vomiting, diarrhea, bright red blood per rectum, melena, or hematemesis Neurologic:  No visual changes, wkns, changes in mental status. All other systems reviewed and are otherwise negative except as noted above.  Physical Exam    Blood pressure 115/76, pulse 92, temperature 98.7 F (37.1 C), temperature source Oral, resp. rate (!) 23, height 5'  7" (1.702 m), weight 86.5 kg (190 lb 11.2 oz), SpO2 98 %.  General: Pleasant, NAD Psych: Normal affect. Neuro: Alert and oriented X 3. Moves all extremities spontaneously. HEENT: Normal  Neck: Supple without bruits or JVD. Lungs:  Resp regular and unlabored, CTA. Heart: RRR no s3, s4, or murmurs. Abdomen: Soft, non-tender, non-distended, BS + x 4.  Extremities: No clubbing, cyanosis or edema. DP/PT/Radials 2+ and equal bilaterally.  Labs    Troponin [facility] of Care Test) Recent Labs    01/26/18 2257  TROPIPOC 0.34*   No results for input(s): CKTOTAL, CKMB, TROPONINI in the last 72 hours. Lab Results  Component Value Date   WBC 9.8 01/26/2018   HGB 11.0 (L) 01/26/2018   HCT 34.2 (L) 01/26/2018   MCV 96.9 01/26/2018   PLT 261 01/26/2018    Recent Labs  Lab 01/26/18 2253  NA 139  K 3.7  CL 102  CO2 26  BUN 22*  CREATININE 1.11  CALCIUM 9.0  GLUCOSE 181*   Lab Results  Component Value Date   CHOL 144 10/05/2017   HDL 45 10/05/2017   LDLCALC 84 10/05/2017   TRIG 77 10/05/2017   Lab Results  Component Value Date  DDIMER 0.99 (H) 08/27/2017     Radiology Studies    Dg Chest Portable 1 View  Result Date: 01/26/2018 CLINICAL DATA:  Acute onset of generalized chest pain, shortness of breath and nausea. EXAM: PORTABLE CHEST 1 VIEW COMPARISON:  Chest radiograph performed 10/09/2017 FINDINGS: The lungs are well-aerated. Vascular congestion is noted. Increased interstitial markings may reflect mild interstitial edema or possibly pneumonia. There is no evidence of pleural effusion or pneumothorax. The cardiomediastinal silhouette is normal in size. The patient is status post median sternotomy, with evidence of prior CABG. No acute osseous abnormalities are seen. IMPRESSION: Vascular congestion noted. Increased interstitial markings may reflect mild interstitial edema or possibly pneumonia. Electronically Signed   By: Garald Balding M.D.   On: 01/26/2018 23:46    ECG &  Cardiac Imaging    ECG shows sinus tachycardia with global ST depressions and elevation in aVL this is somewhat worse compared to the ECG done on February 2019.  Assessment & Plan    NSTEMI: Known CAD status post CABG.  Worsening LVEF for the last years.  Not been evaluated with invasive angiography for the last 12 years.  Ongoing chest pain.  This appears to be a type I NSTEMI.  Plan: -Consider Left heart cath in the morning.  Last dose of apixaban was was this evening. -We will start a heparin drip tomorrow once apixaban effects start wearing off -Continue on aspirin and statin. -His blood pressures have been improving we will start an IV nitro drip.  Will hold off carvedilol at this time point.  We will continue spironolactone for now. -TTE in the morning -Trend troponins and ECG.  Signed, Cristina Gong, MD 01/27/2018, 12:15 AM

## 2018-01-27 NOTE — Progress Notes (Signed)
Progress Note  Patient Name: [name] Date of Encounter: 01/27/2018  Primary Cardiologist: Peter Martinique, MD   Subjective   Sleepy this morning, chest pain has improved.   Inpatient Medications    Scheduled Meds: . [START ON 01/28/2018] aspirin EC  81 mg Oral Daily  . atorvastatin  40 mg Oral q1800  . donepezil  10 mg Oral Daily  . famotidine  10 mg Oral Daily  . FLUoxetine  40 mg Oral Daily  . furosemide  60 mg Oral Daily  . gabapentin  100 mg Oral QHS  . insulin aspart  0-15 Units Subcutaneous TID WC  . ipratropium-albuterol  3 mL Nebulization BID  . lamoTRIgine  100 mg Oral BID  . mouth rinse  15 mL Mouth Rinse BID  . Melatonin  3 mg Oral QHS  . polycarbophil  625 mg Oral BID  . spironolactone  25 mg Oral Daily  . traZODone  100 mg Oral QHS   Continuous Infusions: . nitroGLYCERIN 30 mcg/min (01/27/18 0600)   PRN Meds: acetaminophen, gabapentin, nitroGLYCERIN, ondansetron (ZOFRAN) IV   Vital Signs    Vitals:   01/27/18 0245 01/27/18 0315 01/27/18 0352 01/27/18 0353  BP: 109/77 109/79  116/67  Pulse: 88 86    Resp: (!) 23 (!) 28    Temp:    98 F (36.7 C)  TempSrc:    Oral  SpO2: (!) 89% 98%  98%  Weight:   192 lb 3.9 oz (87.2 kg)   Height:   5' 7"  (1.702 m)     Intake/Output Summary (Last 24 hours) at 01/27/2018 0808 Last data filed at 01/27/2018 0600 Gross per 24 hour  Intake 528.08 ml  Output -  Net 528.08 ml   Filed Weights   01/26/18 2300 01/27/18 0352  Weight: 190 lb 11.2 oz (86.5 kg) 192 lb 3.9 oz (87.2 kg)    Telemetry    Sinus rhythm, multiple PVCs, 5 beats of nonsustained Vtach - Personally Reviewed  ECG    NSR, non specific ST changes in the anterolateral leads, QTc 500  - Personally Reviewed  Physical Exam   GEN: No acute distress.   Neck: No JVD Cardiac: RRR, no murmurs, rubs, or gallops.  Respiratory: Bibasilar crackles  GI: Soft, nontender, non-distended  MS: No edema; No deformity. Neuro:  Nonfocal  Psych: Normal affect     Labs    Chemistry Recent Labs  Lab 01/26/18 2253 01/27/18 0208  NA 139 141  K 3.7 4.1  CL 102 102  CO2 26 29  GLUCOSE 181* 137*  BUN 22* 18  CREATININE 1.11 1.13  CALCIUM 9.0 8.9  PROT 6.8  --   ALBUMIN 3.4*  --   AST 22  --   ALT 17  --   ALKPHOS 74  --   BILITOT 0.4  --   GFRNONAA >60 >60  GFRAA >60 >60  ANIONGAP 11 10     Hematology Recent Labs  Lab 01/26/18 2253 01/27/18 0208  WBC 9.8 11.6*  RBC 3.53* 3.49*  HGB 11.0* 10.8*  HCT 34.2* 34.2*  MCV 96.9 98.0  MCH 31.2 30.9  MCHC 32.2 31.6  RDW 14.2 14.3  PLT 261 268    Cardiac Enzymes Recent Labs  Lab 01/27/18 0156  TROPONINI 3.04*    Recent Labs  Lab 01/26/18 2257  TROPIPOC 0.34*     BNP Recent Labs  Lab 01/26/18 2253  BNP 303.2*     DDimer No results for input(s):  DDIMER in the last 168 hours.   Radiology    Dg Chest Portable 1 View  Result Date: 01/26/2018 CLINICAL DATA:  Acute onset of generalized chest pain, shortness of breath and nausea. EXAM: PORTABLE CHEST 1 VIEW COMPARISON:  Chest radiograph performed 10/09/2017 FINDINGS: The lungs are well-aerated. Vascular congestion is noted. Increased interstitial markings may reflect mild interstitial edema or possibly pneumonia. There is no evidence of pleural effusion or pneumothorax. The cardiomediastinal silhouette is normal in size. The patient is status post median sternotomy, with evidence of prior CABG. No acute osseous abnormalities are seen. IMPRESSION: Vascular congestion noted. Increased interstitial markings may reflect mild interstitial edema or possibly pneumonia. Electronically Signed   By: Garald Balding M.D.   On: 01/26/2018 23:46    Cardiac Studies   Echocardiogram pending today   Patient Profile     73 y.o. male with pmhx of CAD s/p four-vessel CABG 12 years ago, HFrEF (35-40% on most recent echo), OSA, CVA, dementia, and alcohol abuse who presented with sudden onset chest pain. Troponin elevated to 3.04, EKG without  acute ischemic changes.   Assessment & Plan    NSTEMI:  -- Keep NPO for LHC today -- Heparin drip, Eliquis on hold -- Continue ASA and statin  -- BP improved this morning, continue IV nitro drip -- Morphine prn -- Hold carvedilol  -- Echocardiogram pending -- Trend troponins, ECGs   For questions or updates, please contact [facility] Please consult [email or web address] for contact info under Cardiology/STEMI.      Signed, Velna Ochs, MD  01/27/2018, 8:08 AM

## 2018-01-27 NOTE — Progress Notes (Signed)
  Echocardiogram 2D Echocardiogram has been performed.  [name] 01/27/2018, 6:10 PM

## 2018-01-27 NOTE — Progress Notes (Signed)
Spoke with RN regarding IV consult for Cath lab preparation. Due to current pt status, RN to re-enter consult when pt is stable for procedure.

## 2018-01-28 ENCOUNTER — Encounter (HOSPITAL_COMMUNITY): Payer: Self-pay | Admitting: Cardiovascular Disease

## 2018-01-28 ENCOUNTER — Encounter (HOSPITAL_COMMUNITY)
Admission: EM | Disposition: A | Discharge status: Skilled Nursing Facility | Payer: Self-pay | Source: Home / Self Care | Attending: Cardiovascular Disease

## 2018-01-28 DIAGNOSIS — R1110 Vomiting, unspecified: Secondary | ICD-10-CM

## 2018-01-28 DIAGNOSIS — I2510 Atherosclerotic heart disease of native coronary artery without angina pectoris: Secondary | ICD-10-CM

## 2018-01-28 DIAGNOSIS — R112 Nausea with vomiting, unspecified: Secondary | ICD-10-CM

## 2018-01-28 DIAGNOSIS — R0902 Hypoxemia: Secondary | ICD-10-CM

## 2018-01-28 HISTORY — PX: CORONARY STENT INTERVENTION: CATH118234

## 2018-01-28 HISTORY — PX: LEFT HEART CATH AND CORS/GRAFTS ANGIOGRAPHY: CATH118250

## 2018-01-28 LAB — BASIC METABOLIC PANEL
Anion gap: 10 (ref 5–15)
BUN: 20 mg/dL (ref 6–20)
CHLORIDE: 100 mmol/L — AB (ref 101–111)
CO2: 26 mmol/L (ref 22–32)
CREATININE: 1.23 mg/dL (ref 0.61–1.24)
Calcium: 8.6 mg/dL — ABNORMAL LOW (ref 8.9–10.3)
GFR calc Af Amer: >60 mL/min (ref >60)
GFR, EST NON AFRICAN AMERICAN: 56 mL/min — AB (ref >60)
Glucose, Bld: 133 mg/dL — ABNORMAL HIGH (ref 65–99)
Potassium: 3.9 mmol/L (ref 3.5–5.1)
SODIUM: 136 mmol/L (ref 135–145)

## 2018-01-28 LAB — GLUCOSE, CAPILLARY
GLUCOSE-CAPILLARY: 146 mg/dL — AB (ref 65–99)
GLUCOSE-CAPILLARY: 131 mg/dL — AB (ref 65–99)
GLUCOSE-CAPILLARY: 175 mg/dL — AB (ref 65–99)
Glucose-Capillary: 166 mg/dL — ABNORMAL HIGH (ref 65–99)

## 2018-01-28 LAB — POCT ACTIVATED CLOTTING TIME
ACTIVATED CLOTTING TIME: 224 s
ACTIVATED CLOTTING TIME: 274 s

## 2018-01-28 LAB — ECHOCARDIOGRAM COMPLETE
Height: 67 in
WEIGHTICAEL: 3075.86 [oz_av]

## 2018-01-28 LAB — TROPONIN I: Troponin I: 8.34 ng/mL (ref <0.03)

## 2018-01-28 SURGERY — LEFT HEART CATH AND CORS/GRAFTS ANGIOGRAPHY
Anesthesia: LOCAL

## 2018-01-28 MED ORDER — FENTANYL CITRATE (PF) 100 MCG/2ML IJ SOLN
INTRAMUSCULAR | Status: DC | PRN
  Administered 2018-01-28: 12.5 ug via INTRAVENOUS

## 2018-01-28 MED ORDER — APIXABAN 5 MG PO TABS
5.0000 mg | ORAL_TABLET | Freq: Two times a day (BID) | ORAL | Status: DC
  Administered 2018-01-29 – 2018-01-31 (×5): 5 mg via ORAL
  Filled 2018-01-28 (×5): qty 1

## 2018-01-28 MED ORDER — HEPARIN (PORCINE) IN NACL 1000-0.9 UT/500ML-% IV SOLN
INTRAVENOUS | Status: AC
  Filled 2018-01-28: qty 1000

## 2018-01-28 MED ORDER — IOHEXOL 350 MG/ML SOLN
INTRAVENOUS | Status: DC | PRN
  Administered 2018-01-28: 100 mL via INTRA_ARTERIAL

## 2018-01-28 MED ORDER — CLOPIDOGREL BISULFATE 75 MG PO TABS
75.0000 mg | ORAL_TABLET | Freq: Every day | ORAL | Status: DC
  Administered 2018-01-29 – 2018-01-31 (×3): 75 mg via ORAL
  Filled 2018-01-28 (×3): qty 1

## 2018-01-28 MED ORDER — VERAPAMIL HCL 2.5 MG/ML IV SOLN
INTRAVENOUS | Status: DC | PRN
  Administered 2018-01-28: 10 mL via INTRA_ARTERIAL

## 2018-01-28 MED ORDER — SODIUM CHLORIDE 0.9 % IV SOLN: 250.0000 mL | INTRAVENOUS | Status: DC | PRN

## 2018-01-28 MED ORDER — ACETAMINOPHEN 325 MG PO TABS
650.0000 mg | ORAL_TABLET | ORAL | Status: DC | PRN
  Administered 2018-01-28 – 2018-01-29 (×2): 650 mg via ORAL
  Filled 2018-01-28 (×2): qty 2

## 2018-01-28 MED ORDER — FUROSEMIDE 10 MG/ML IJ SOLN
INTRAMUSCULAR | Status: AC
  Administered 2018-01-28: 20 mg via INTRAVENOUS
  Filled 2018-01-28: qty 2

## 2018-01-28 MED ORDER — LIDOCAINE HCL (PF) 1 % IJ SOLN
INTRAMUSCULAR | Status: AC
  Filled 2018-01-28: qty 30

## 2018-01-28 MED ORDER — LABETALOL HCL 5 MG/ML IV SOLN: 10.0000 mg | INTRAVENOUS | Status: AC | PRN

## 2018-01-28 MED ORDER — FUROSEMIDE 10 MG/ML IJ SOLN
20.0000 mg | Freq: Once | INTRAMUSCULAR | Status: AC
  Administered 2018-01-28: 20 mg via INTRAVENOUS

## 2018-01-28 MED ORDER — MIDAZOLAM HCL 2 MG/2ML IJ SOLN
INTRAMUSCULAR | Status: DC | PRN
  Administered 2018-01-28: 0.5 mg via INTRAVENOUS

## 2018-01-28 MED ORDER — HEPARIN SODIUM (PORCINE) 1000 UNIT/ML IJ SOLN
INTRAMUSCULAR | Status: AC
  Filled 2018-01-28: qty 1

## 2018-01-28 MED ORDER — CANGRELOR TETRASODIUM 50 MG IV SOLR
INTRAVENOUS | Status: AC
  Filled 2018-01-28: qty 50

## 2018-01-28 MED ORDER — FUROSEMIDE 40 MG PO TABS: 60.0000 mg | ORAL_TABLET | Freq: Every day | ORAL | Status: DC

## 2018-01-28 MED ORDER — VERAPAMIL HCL 2.5 MG/ML IV SOLN
INTRAVENOUS | Status: AC
  Filled 2018-01-28: qty 2

## 2018-01-28 MED ORDER — SODIUM CHLORIDE 0.9 % IV SOLN
4.0000 ug/kg/min | INTRAVENOUS | Status: DC
Start: 2018-01-28 — End: 2018-01-28

## 2018-01-28 MED ORDER — SODIUM CHLORIDE 0.9 % IV SOLN
INTRAVENOUS | Status: AC | PRN
  Administered 2018-01-28: 4 ug/kg/min via INTRAVENOUS

## 2018-01-28 MED ORDER — SODIUM CHLORIDE 0.9 % IV SOLN
INTRAVENOUS | Status: AC | PRN
  Administered 2018-01-28: 25 mL/h via INTRAVENOUS

## 2018-01-28 MED ORDER — HEPARIN SODIUM (PORCINE) 1000 UNIT/ML IJ SOLN
INTRAMUSCULAR | Status: DC | PRN
  Administered 2018-01-28 (×2): 5000 [IU] via INTRAVENOUS
  Administered 2018-01-28: 4000 [IU] via INTRAVENOUS

## 2018-01-28 MED ORDER — SODIUM CHLORIDE 0.9% FLUSH: 3.0000 mL | INTRAVENOUS | Status: DC | PRN

## 2018-01-28 MED ORDER — HEPARIN (PORCINE) IN NACL 2-0.9 UNITS/ML
INTRAMUSCULAR | Status: AC | PRN
  Administered 2018-01-28 (×2): 500 mL via INTRA_ARTERIAL

## 2018-01-28 MED ORDER — LIDOCAINE HCL (PF) 1 % IJ SOLN
INTRAMUSCULAR | Status: DC | PRN
  Administered 2018-01-28: 2 mL via INTRADERMAL

## 2018-01-28 MED ORDER — SODIUM CHLORIDE 0.9% FLUSH
3.0000 mL | Freq: Two times a day (BID) | INTRAVENOUS | Status: DC
  Administered 2018-01-28 – 2018-01-30 (×4): 3 mL via INTRAVENOUS

## 2018-01-28 MED ORDER — CANGRELOR BOLUS VIA INFUSION
INTRAVENOUS | Status: DC | PRN
  Administered 2018-01-28: 2601 ug via INTRAVENOUS

## 2018-01-28 MED ORDER — HYDRALAZINE HCL 20 MG/ML IJ SOLN: 5.0000 mg | INTRAMUSCULAR | Status: AC | PRN

## 2018-01-28 MED ORDER — MIDAZOLAM HCL 2 MG/2ML IJ SOLN
INTRAMUSCULAR | Status: AC
  Filled 2018-01-28: qty 2

## 2018-01-28 MED ORDER — FENTANYL CITRATE (PF) 100 MCG/2ML IJ SOLN
INTRAMUSCULAR | Status: AC
  Filled 2018-01-28: qty 2

## 2018-01-28 MED ORDER — CLOPIDOGREL BISULFATE 300 MG PO TABS
600.0000 mg | ORAL_TABLET | Freq: Once | ORAL | Status: AC
  Administered 2018-01-28: 600 mg via ORAL
  Filled 2018-01-28: qty 2

## 2018-01-28 SURGICAL SUPPLY — 20 items
BALLN SAPPHIRE 2.5X15 (BALLOONS) ×2
BALLN SAPPHIRE ~~LOC~~ 3.0X12 (BALLOONS) ×2 IMPLANT
BALLN SAPPHIRE ~~LOC~~ 3.75X12 (BALLOONS) ×2 IMPLANT
BALLOON SAPPHIRE 2.5X15 (BALLOONS) ×1 IMPLANT
CATH INFINITI 5 FR AL2 (CATHETERS) ×2 IMPLANT
CATH INFINITI 5FR MULTPACK ANG (CATHETERS) ×2 IMPLANT
CATH LAUNCHER 6FR EBU3.5 (CATHETERS) ×2 IMPLANT
DEVICE RAD COMP TR BAND LRG (VASCULAR PRODUCTS) ×2 IMPLANT
ELECT DEFIB PAD ADLT CADENCE (PAD) ×2 IMPLANT
GUIDEWIRE INQWIRE 1.5J.035X260 (WIRE) ×1 IMPLANT
INQWIRE 1.5J .035X260CM (WIRE) ×2
KIT ENCORE 26 ADVANTAGE (KITS) ×2 IMPLANT
KIT HEART LEFT (KITS) ×2 IMPLANT
NEEDLE PERC 21GX4CM (NEEDLE) ×2 IMPLANT
PACK CARDIAC CATHETERIZATION (CUSTOM PROCEDURE TRAY) ×2 IMPLANT
SHEATH RAIN RADIAL 21G 6FR (SHEATH) ×2 IMPLANT
STENT SYNERGY DES 3.5X16 (Permanent Stent) ×2 IMPLANT
TRANSDUCER W/STOPCOCK (MISCELLANEOUS) ×2 IMPLANT
TUBING CIL FLEX 10 FLL-RA (TUBING) ×2 IMPLANT
WIRE COUGAR XT STRL 190CM (WIRE) ×2 IMPLANT

## 2018-01-28 NOTE — Interval H&P Note (Signed)
History and Physical Interval Note:  01/28/2018 10:22 AM  [name]  has presented today for surgery, with the diagnosis of n stemi  The various methods of treatment have been discussed with the patient and family. After consideration of risks, benefits and other options for treatment, the patient has consented to  Procedure(s): LEFT HEART CATH AND CORS/GRAFTS ANGIOGRAPHY (N/A) as a surgical intervention .  The patient's history has been reviewed, patient examined, no change in status, stable for surgery.  I have reviewed the patient's chart and labs.  Questions were answered to the patient's satisfaction.     Sherren Mocha

## 2018-01-28 NOTE — Interval H&P Note (Signed)
History and Physical Interval Note:  01/28/2018 10:26 AM  [name]  has presented today for surgery, with the diagnosis of n stemi  The various methods of treatment have been discussed with the patient and family. After consideration of risks, benefits and other options for treatment, the patient has consented to  Procedure(s): LEFT HEART CATH AND CORS/GRAFTS ANGIOGRAPHY (N/A) as a surgical intervention .  The patient's history has been reviewed, patient examined, no change in status, stable for surgery.  I have reviewed the patient's chart and labs.  Questions were answered to the patient's satisfaction.    I discussed procedure with the patient. He seems to understand basic aspects but I don't feel comfortable with his ability to provide full informed consent. I called his wife and spoke at length with her regarding indications, risks, and alternatives to cardiac cath and possible angioplasty and stenting. She understands and provides verbal consent.   Sherren Mocha

## 2018-01-28 NOTE — H&P (View-Only) (Signed)
Progress Note  Patient Name: [name] Date of Encounter: 01/28/2018  Primary Cardiologist: Jacob Martinique, MD   Subjective   Patient was unable to go for [name] yesterday due to hypoxia and persistent vomiting. Home lasix was held yesterday due to low BP. Nitro drip was discontinued and he was given IV lasix 80 mg. Today breathing & BP have improved. Able to lay flat.   Inpatient Medications    Scheduled Meds: . aspirin EC  81 mg Oral Daily  . atorvastatin  80 mg Oral q1800  . donepezil  10 mg Oral Daily  . famotidine  10 mg Oral Daily  . FLUoxetine  40 mg Oral Daily  . furosemide  80 mg Intravenous Q12H  . gabapentin  100 mg Oral QHS  . insulin aspart  0-15 Units Subcutaneous TID WC  . lamoTRIgine  100 mg Oral BID  . mouth rinse  15 mL Mouth Rinse BID  . Melatonin  3 mg Oral QHS  . polycarbophil  625 mg Oral BID  . sodium chloride flush  3 mL Intravenous Q12H  . spironolactone  25 mg Oral Daily  . traZODone  100 mg Oral QHS   Continuous Infusions: . sodium chloride    . sodium chloride 10 mL/hr at 01/27/18 1415   PRN Meds: sodium chloride, acetaminophen, gabapentin, ipratropium-albuterol, morphine injection, nitroGLYCERIN, ondansetron (ZOFRAN) IV, promethazine, sodium chloride flush   Vital Signs    Vitals:   01/27/18 2034 01/27/18 2244 01/27/18 2356 01/28/18 0425  BP: 110/76  101/69 110/72  Pulse: (!) 116 93 93 94  Resp: (!) 29 14 13 14   Temp: 97.6 F (36.4 C)  98 F (36.7 C) 98.5 F (36.9 C)  TempSrc: Oral  Axillary Axillary  SpO2: 93% 96% 95% 100%  Weight:      Height:        Intake/Output Summary (Last 24 hours) at 01/28/2018 0853 Last data filed at 01/28/2018 0443 Gross per 24 hour  Intake 483 ml  Output 325 ml  Net 158 ml   Filed Weights   01/26/18 2300 01/27/18 0352 01/27/18 1402  Weight: 190 lb 11.2 oz (86.5 kg) 192 lb 3.9 oz (87.2 kg) 191 lb 3.2 oz (86.7 kg)    Telemetry    Sinus rhythm, multiple PVCs, 5 beats of nonsustained Vtach -  Personally Reviewed  ECG    NSR, ST depression in V2-V5, prolonged QTc  - Personally Reviewed  Physical Exam   GEN: No acute distress.   Neck: No JVD Cardiac: RRR, no murmurs, rubs, or gallops.  Respiratory: Bibasilar crackles  GI: Soft, nontender, non-distended  MS: No edema; No deformity. Neuro:  Nonfocal  Psych: Normal affect   Labs    Chemistry Recent Labs  Lab 01/26/18 2253 01/27/18 0208 01/28/18 0420  NA 139 141 136  K 3.7 4.1 3.9  CL 102 102 100*  CO2 26 29 26   GLUCOSE 181* 137* 133*  BUN 22* 18 20  CREATININE 1.11 1.13 1.23  CALCIUM 9.0 8.9 8.6*  PROT 6.8  --   --   ALBUMIN 3.4*  --   --   AST 22  --   --   ALT 17  --   --   ALKPHOS 74  --   --   BILITOT 0.4  --   --   GFRNONAA >60 >60 56*  GFRAA >60 >60 >60  ANIONGAP 11 10 10      Hematology Recent Labs  Lab 01/26/18 2253 01/27/18  8756 01/27/18 1611  WBC 9.8 11.6* 9.3  RBC 3.53* 3.49* 3.12*  HGB 11.0* 10.8* 9.6*  HCT 34.2* 34.2* 30.2*  MCV 96.9 98.0 96.8  MCH 31.2 30.9 30.8  MCHC 32.2 31.6 31.8  RDW 14.2 14.3 14.6  PLT 261 268 233    Cardiac Enzymes Recent Labs  Lab 01/27/18 0156 01/27/18 1137  TROPONINI 3.04* 5.14*    Recent Labs  Lab 01/26/18 2257  TROPIPOC 0.34*     BNP Recent Labs  Lab 01/26/18 2253  BNP 303.2*     DDimer No results for input(s): DDIMER in the last 168 hours.   Radiology    Dg Abd 1 View  Result Date: 01/27/2018 CLINICAL DATA:  Emesis and diarrhea today EXAM: ABDOMEN - 1 VIEW COMPARISON:  10/04/2017 FINDINGS: Normal bowel gas pattern. No bowel dilatation or bowel wall thickening. Prior lumbosacral fusion. Surgical clips LEFT upper quadrant. Penile prosthesis. Loop recorder projects over lower chest with postsurgical changes of CABG. Bones demineralized. IMPRESSION: Nonobstructive bowel gas pattern. Electronically Signed   By: [name] M.D.   On: 01/27/2018 16:50   Dg Chest Port 1 View  Result Date: 01/27/2018 CLINICAL DATA:  Hypoxia, history  hypertension, diabetes mellitus, coronary artery disease, stroke, pulmonary embolism EXAM: PORTABLE CHEST 1 VIEW COMPARISON:  Portable exam 1627 hours compared to 01/26/2018 FINDINGS: Loop recorder projects over lower LEFT chest. Enlargement of cardiac silhouette post CABG. Mediastinal contours and pulmonary vascularity normal. Atherosclerotic calcification aorta. Lungs clear. No pleural effusion or pneumothorax. Calcified pleural plaques bilaterally. Bones demineralized. IMPRESSION: Enlargement of cardiac silhouette post CABG. Stable calcified pleural plaque disease question asbestos exposure without acute abnormalities. Electronically Signed   By: [name] M.D.   On: 01/27/2018 16:49   Dg Chest Portable 1 View  Result Date: 01/26/2018 CLINICAL DATA:  Acute onset of generalized chest pain, shortness of breath and nausea. EXAM: PORTABLE CHEST 1 VIEW COMPARISON:  Chest radiograph performed 10/09/2017 FINDINGS: The lungs are well-aerated. Vascular congestion is noted. Increased interstitial markings may reflect mild interstitial edema or possibly pneumonia. There is no evidence of pleural effusion or pneumothorax. The cardiomediastinal silhouette is normal in size. The patient is status post median sternotomy, with evidence of prior CABG. No acute osseous abnormalities are seen. IMPRESSION: Vascular congestion noted. Increased interstitial markings may reflect mild interstitial edema or possibly pneumonia. Electronically Signed   By: [name] M.D.   On: 01/26/2018 23:46    Cardiac Studies   Echocardiogram pending today   Patient Profile     72 y.o. male with pmhx of CAD s/p four-vessel CABG 12 years ago, HFrEF (35-40% on most recent echo), OSA, CVA, dementia, and alcohol abuse who presented with sudden onset chest pain. Troponin elevated to 3.04, EKG without acute ischemic changes.   Assessment & Plan    NSTEMI: Cath delayed yesterday due to hypoxia. Patient devloped hypotension as a side  effect of nitro drip and lasix was held. He subsequently developed pulmonary edema requiring IV lasix. He appears improved today, able to lay flat without SOB. Trop continues to rise, 5.14 yesterday. EKG today with new ST depression in the lateral leads.  -- Keep NPO for LHC today -- Heparin drip, Eliquis on hold -- Nitro drip DC'd yesterday  -- Continue ASA and statin  -- Morphine prn, zofran / phenergan prn for nausea  -- Hold carvedilol  -- Echocardiogram pending -- Trend troponins, ECGs   For questions or updates, please contact [facility] Please consult [email or web address] for contact  info under Cardiology/STEMI.      Signed, Velna Ochs, MD  01/28/2018, 8:53 AM

## 2018-01-28 NOTE — Progress Notes (Signed)
Received report from [place] at ~1650. This RN observed pt's SpO2 decreasing into the 80's.  Pt had removed Edgar. McMullen replaced at 2L/m, SpO2 mid 80's. O2 increased to 4L/m and SpO2 increased to 92%. Fine crackles auscultated throughout pt's lung fields. RR 25-30 with increased WOB, Pt denied SOB. IV fluids immediately d/c'd. On call Cards PA paged. Verbal orders given for Lasix 38m IV once.

## 2018-01-28 NOTE — Progress Notes (Signed)
Transported to cath lab via stretcher.

## 2018-01-28 NOTE — Progress Notes (Signed)
Progress Note  Patient Name: [name] Date of Encounter: 01/28/2018  Primary Cardiologist: Peter Martinique, MD   Subjective   Patient was unable to go for [facility] yesterday due to hypoxia and persistent vomiting. Home lasix was held yesterday due to low BP. Nitro drip was discontinued and he was given IV lasix 80 mg. Today breathing & BP have improved. Able to lay flat.   Inpatient Medications    Scheduled Meds: . aspirin EC  81 mg Oral Daily  . atorvastatin  80 mg Oral q1800  . donepezil  10 mg Oral Daily  . famotidine  10 mg Oral Daily  . FLUoxetine  40 mg Oral Daily  . furosemide  80 mg Intravenous Q12H  . gabapentin  100 mg Oral QHS  . insulin aspart  0-15 Units Subcutaneous TID WC  . lamoTRIgine  100 mg Oral BID  . mouth rinse  15 mL Mouth Rinse BID  . Melatonin  3 mg Oral QHS  . polycarbophil  625 mg Oral BID  . sodium chloride flush  3 mL Intravenous Q12H  . spironolactone  25 mg Oral Daily  . traZODone  100 mg Oral QHS   Continuous Infusions: . sodium chloride    . sodium chloride 10 mL/hr at 01/27/18 1415   PRN Meds: sodium chloride, acetaminophen, gabapentin, ipratropium-albuterol, morphine injection, nitroGLYCERIN, ondansetron (ZOFRAN) IV, promethazine, sodium chloride flush   Vital Signs    Vitals:   01/27/18 2034 01/27/18 2244 01/27/18 2356 01/28/18 0425  BP: 110/76  101/69 110/72  Pulse: (!) 116 93 93 94  Resp: (!) 29 14 13 14   Temp: 97.6 F (36.4 C)  98 F (36.7 C) 98.5 F (36.9 C)  TempSrc: Oral  Axillary Axillary  SpO2: 93% 96% 95% 100%  Weight:      Height:        Intake/Output Summary (Last 24 hours) at 01/28/2018 0853 Last data filed at 01/28/2018 0443 Gross per 24 hour  Intake 483 ml  Output 325 ml  Net 158 ml   Filed Weights   01/26/18 2300 01/27/18 0352 01/27/18 1402  Weight: 190 lb 11.2 oz (86.5 kg) 192 lb 3.9 oz (87.2 kg) 191 lb 3.2 oz (86.7 kg)    Telemetry    Sinus rhythm, multiple PVCs, 5 beats of nonsustained Vtach -  Personally Reviewed  ECG    NSR, ST depression in V2-V5, prolonged QTc  - Personally Reviewed  Physical Exam   GEN: No acute distress.   Neck: No JVD Cardiac: RRR, no murmurs, rubs, or gallops.  Respiratory: Bibasilar crackles  GI: Soft, nontender, non-distended  MS: No edema; No deformity. Neuro:  Nonfocal  Psych: Normal affect   Labs    Chemistry Recent Labs  Lab 01/26/18 2253 01/27/18 0208 01/28/18 0420  NA 139 141 136  K 3.7 4.1 3.9  CL 102 102 100*  CO2 26 29 26   GLUCOSE 181* 137* 133*  BUN 22* 18 20  CREATININE 1.11 1.13 1.23  CALCIUM 9.0 8.9 8.6*  PROT 6.8  --   --   ALBUMIN 3.4*  --   --   AST 22  --   --   ALT 17  --   --   ALKPHOS 74  --   --   BILITOT 0.4  --   --   GFRNONAA >60 >60 56*  GFRAA >60 >60 >60  ANIONGAP 11 10 10      Hematology Recent Labs  Lab 01/26/18 2253 01/27/18  1287 01/27/18 1611  WBC 9.8 11.6* 9.3  RBC 3.53* 3.49* 3.12*  HGB 11.0* 10.8* 9.6*  HCT 34.2* 34.2* 30.2*  MCV 96.9 98.0 96.8  MCH 31.2 30.9 30.8  MCHC 32.2 31.6 31.8  RDW 14.2 14.3 14.6  PLT 261 268 233    Cardiac Enzymes Recent Labs  Lab 01/27/18 0156 01/27/18 1137  TROPONINI 3.04* 5.14*    Recent Labs  Lab 01/26/18 2257  TROPIPOC 0.34*     BNP Recent Labs  Lab 01/26/18 2253  BNP 303.2*     DDimer No results for input(s): DDIMER in the last 168 hours.   Radiology    Dg Abd 1 View  Result Date: 01/27/2018 CLINICAL DATA:  Emesis and diarrhea today EXAM: ABDOMEN - 1 VIEW COMPARISON:  10/04/2017 FINDINGS: Normal bowel gas pattern. No bowel dilatation or bowel wall thickening. Prior lumbosacral fusion. Surgical clips LEFT upper quadrant. Penile prosthesis. Loop recorder projects over lower chest with postsurgical changes of CABG. Bones demineralized. IMPRESSION: Nonobstructive bowel gas pattern. Electronically Signed   By: Lavonia Dana M.D.   On: 01/27/2018 16:50   Dg Chest Port 1 View  Result Date: 01/27/2018 CLINICAL DATA:  Hypoxia, history  hypertension, diabetes mellitus, coronary artery disease, stroke, pulmonary embolism EXAM: PORTABLE CHEST 1 VIEW COMPARISON:  Portable exam 1627 hours compared to 01/26/2018 FINDINGS: Loop recorder projects over lower LEFT chest. Enlargement of cardiac silhouette post CABG. Mediastinal contours and pulmonary vascularity normal. Atherosclerotic calcification aorta. Lungs clear. No pleural effusion or pneumothorax. Calcified pleural plaques bilaterally. Bones demineralized. IMPRESSION: Enlargement of cardiac silhouette post CABG. Stable calcified pleural plaque disease question asbestos exposure without acute abnormalities. Electronically Signed   By: Lavonia Dana M.D.   On: 01/27/2018 16:49   Dg Chest Portable 1 View  Result Date: 01/26/2018 CLINICAL DATA:  Acute onset of generalized chest pain, shortness of breath and nausea. EXAM: PORTABLE CHEST 1 VIEW COMPARISON:  Chest radiograph performed 10/09/2017 FINDINGS: The lungs are well-aerated. Vascular congestion is noted. Increased interstitial markings may reflect mild interstitial edema or possibly pneumonia. There is no evidence of pleural effusion or pneumothorax. The cardiomediastinal silhouette is normal in size. The patient is status post median sternotomy, with evidence of prior CABG. No acute osseous abnormalities are seen. IMPRESSION: Vascular congestion noted. Increased interstitial markings may reflect mild interstitial edema or possibly pneumonia. Electronically Signed   By: Garald Balding M.D.   On: 01/26/2018 23:46    Cardiac Studies   Echocardiogram pending today   Patient Profile     72 y.o. male with pmhx of CAD s/p four-vessel CABG 12 years ago, HFrEF (35-40% on most recent echo), OSA, CVA, dementia, and alcohol abuse who presented with sudden onset chest pain. Troponin elevated to 3.04, EKG without acute ischemic changes.   Assessment & Plan    NSTEMI: Cath delayed yesterday due to hypoxia. Patient devloped hypotension as a side  effect of nitro drip and lasix was held. He subsequently developed pulmonary edema requiring IV lasix. He appears improved today, able to lay flat without SOB. Trop continues to rise, 5.14 yesterday. EKG today with new ST depression in the lateral leads.  -- Keep NPO for LHC today -- Heparin drip, Eliquis on hold -- Nitro drip DC'd yesterday  -- Continue ASA and statin  -- Morphine prn, zofran / phenergan prn for nausea  -- Hold carvedilol  -- Echocardiogram pending -- Trend troponins, ECGs   For questions or updates, please contact [facility] Please consult [email or web address] for contact  info under Cardiology/STEMI.      Signed, Velna Ochs, MD  01/28/2018, 8:53 AM

## 2018-01-29 ENCOUNTER — Inpatient Hospital Stay (HOSPITAL_COMMUNITY): Payer: Medicare Other

## 2018-01-29 DIAGNOSIS — T17908A Unspecified foreign body in respiratory tract, part unspecified causing other injury, initial encounter: Secondary | ICD-10-CM

## 2018-01-29 LAB — BASIC METABOLIC PANEL
Anion gap: 11 (ref 5–15)
BUN: 21 mg/dL — AB (ref 6–20)
CHLORIDE: 97 mmol/L — AB (ref 101–111)
CO2: 29 mmol/L (ref 22–32)
CREATININE: 1.12 mg/dL (ref 0.61–1.24)
Calcium: 8.4 mg/dL — ABNORMAL LOW (ref 8.9–10.3)
GFR calc Af Amer: >60 mL/min (ref >60)
GFR calc non Af Amer: >60 mL/min (ref >60)
GLUCOSE: 140 mg/dL — AB (ref 65–99)
POTASSIUM: 3.4 mmol/L — AB (ref 3.5–5.1)
Sodium: 137 mmol/L (ref 135–145)

## 2018-01-29 LAB — CBC
HEMATOCRIT: 29.4 % — AB (ref 39.0–52.0)
Hemoglobin: 9.5 g/dL — ABNORMAL LOW (ref 13.0–17.0)
MCH: 31 pg (ref 26.0–34.0)
MCHC: 32.3 g/dL (ref 30.0–36.0)
MCV: 96.1 fL (ref 78.0–100.0)
PLATELETS: 202 10*3/uL (ref 150–400)
RBC: 3.06 MIL/uL — ABNORMAL LOW (ref 4.22–5.81)
RDW: 13.9 % (ref 11.5–15.5)
WBC: 10.1 10*3/uL (ref 4.0–10.5)

## 2018-01-29 LAB — GLUCOSE, CAPILLARY
GLUCOSE-CAPILLARY: 125 mg/dL — AB (ref 65–99)
Glucose-Capillary: 133 mg/dL — ABNORMAL HIGH (ref 65–99)
Glucose-Capillary: 136 mg/dL — ABNORMAL HIGH (ref 65–99)
Glucose-Capillary: 123 mg/dL — ABNORMAL HIGH (ref 65–99)

## 2018-01-29 MED ORDER — FUROSEMIDE 10 MG/ML IJ SOLN
40.0000 mg | Freq: Two times a day (BID) | INTRAMUSCULAR | Status: DC
  Administered 2018-01-29: 40 mg via INTRAVENOUS
  Filled 2018-01-29: qty 4

## 2018-01-29 MED ORDER — FUROSEMIDE 40 MG PO TABS
60.0000 mg | ORAL_TABLET | Freq: Every day | ORAL | Status: DC
  Administered 2018-01-30 – 2018-01-31 (×2): 60 mg via ORAL
  Filled 2018-01-29 (×2): qty 1

## 2018-01-29 MED ORDER — FUROSEMIDE 10 MG/ML IJ SOLN
40.0000 mg | Freq: Two times a day (BID) | INTRAMUSCULAR | Status: AC
  Administered 2018-01-29: 40 mg via INTRAVENOUS
  Filled 2018-01-29: qty 4

## 2018-01-29 NOTE — Progress Notes (Addendum)
Progress Note  Patient Name: [name] Date of Encounter: 01/29/2018  Primary Cardiologist: Peter Martinique, MD   Subjective   Continues to have chest pain, unchanged. Sitting up in bed eating breakfast, appears comfortable.    Inpatient Medications    Scheduled Meds: . apixaban  5 mg Oral BID  . aspirin EC  81 mg Oral Daily  . atorvastatin  80 mg Oral q1800  . clopidogrel  75 mg Oral Q breakfast  . donepezil  10 mg Oral Daily  . famotidine  10 mg Oral Daily  . FLUoxetine  40 mg Oral Daily  . furosemide  60 mg Oral Daily  . gabapentin  100 mg Oral QHS  . insulin aspart  0-15 Units Subcutaneous TID WC  . lamoTRIgine  100 mg Oral BID  . mouth rinse  15 mL Mouth Rinse BID  . Melatonin  3 mg Oral QHS  . polycarbophil  625 mg Oral BID  . sodium chloride flush  3 mL Intravenous Q12H  . spironolactone  25 mg Oral Daily  . traZODone  100 mg Oral QHS   Continuous Infusions: . sodium chloride     PRN Meds: sodium chloride, acetaminophen, gabapentin, ipratropium-albuterol, morphine injection, nitroGLYCERIN, ondansetron (ZOFRAN) IV, promethazine, sodium chloride flush   Vital Signs    Vitals:   01/29/18 0500 01/29/18 0600 01/29/18 0700 01/29/18 0743  BP: (!) 98/37 (!) 92/57 94/60   Pulse: 83 82 84   Resp: (!) 38 16 (!) 22   Temp:    99.3 F (37.4 C)  TempSrc:    Oral  SpO2: 94% 96% 96%   Weight: 195 lb 1.7 oz (88.5 kg)     Height:        Intake/Output Summary (Last 24 hours) at 01/29/2018 0813 Last data filed at 01/29/2018 0500 Gross per 24 hour  Intake 216 ml  Output 950 ml  Net -734 ml   Filed Weights   01/27/18 1402 01/28/18 1330 01/29/18 0500  Weight: 191 lb 3.2 oz (86.7 kg) 189 lb 9.5 oz (86 kg) 195 lb 1.7 oz (88.5 kg)    Telemetry    Sinus rhythm, multiple PVCs  - Personally Reviewed  ECG    NSR, ST depression in V2-V5, prolonged QTc  - Personally Reviewed  Physical Exam   GEN: No acute distress.   Neck: No JVD Cardiac: RRR, no murmurs, rubs, or  gallops.  Respiratory: Bibasilar crackles  GI: Soft, nontender, non-distended  MS: No edema; No deformity. Neuro:  Nonfocal  Psych: Normal affect   Labs    Chemistry Recent Labs  Lab 01/26/18 2253 01/27/18 0208 01/28/18 0420 01/29/18 0250  NA 139 141 136 137  K 3.7 4.1 3.9 3.4*  CL 102 102 100* 97*  CO2 26 29 26 29   GLUCOSE 181* 137* 133* 140*  BUN 22* 18 20 21*  CREATININE 1.11 1.13 1.23 1.12  CALCIUM 9.0 8.9 8.6* 8.4*  PROT 6.8  --   --   --   ALBUMIN 3.4*  --   --   --   AST 22  --   --   --   ALT 17  --   --   --   ALKPHOS 74  --   --   --   BILITOT 0.4  --   --   --   GFRNONAA >60 >60 56* >60  GFRAA >60 >60 >60 >60  ANIONGAP 11 10 10 11      Hematology Recent  Labs  Lab 01/27/18 0208 01/27/18 1611 01/29/18 0250  WBC 11.6* 9.3 10.1  RBC 3.49* 3.12* 3.06*  HGB 10.8* 9.6* 9.5*  HCT 34.2* 30.2* 29.4*  MCV 98.0 96.8 96.1  MCH 30.9 30.8 31.0  MCHC 31.6 31.8 32.3  RDW 14.3 14.6 13.9  PLT 268 233 202    Cardiac Enzymes Recent Labs  Lab 01/27/18 0156 01/27/18 1137 01/28/18 1433  TROPONINI 3.04* 5.14* 8.34*    Recent Labs  Lab 01/26/18 2257  TROPIPOC 0.34*     BNP Recent Labs  Lab 01/26/18 2253  BNP 303.2*     DDimer No results for input(s): DDIMER in the last 168 hours.   Radiology    Dg Abd 1 View  Result Date: 01/27/2018 CLINICAL DATA:  Emesis and diarrhea today EXAM: ABDOMEN - 1 VIEW COMPARISON:  10/04/2017 FINDINGS: Normal bowel gas pattern. No bowel dilatation or bowel wall thickening. Prior lumbosacral fusion. Surgical clips LEFT upper quadrant. Penile prosthesis. Loop recorder projects over lower chest with postsurgical changes of CABG. Bones demineralized. IMPRESSION: Nonobstructive bowel gas pattern. Electronically Signed   By: Lavonia Dana M.D.   On: 01/27/2018 16:50   Dg Chest Port 1 View  Result Date: 01/27/2018 CLINICAL DATA:  Hypoxia, history hypertension, diabetes mellitus, coronary artery disease, stroke, pulmonary embolism  EXAM: PORTABLE CHEST 1 VIEW COMPARISON:  Portable exam 1627 hours compared to 01/26/2018 FINDINGS: Loop recorder projects over lower LEFT chest. Enlargement of cardiac silhouette post CABG. Mediastinal contours and pulmonary vascularity normal. Atherosclerotic calcification aorta. Lungs clear. No pleural effusion or pneumothorax. Calcified pleural plaques bilaterally. Bones demineralized. IMPRESSION: Enlargement of cardiac silhouette post CABG. Stable calcified pleural plaque disease question asbestos exposure without acute abnormalities. Electronically Signed   By: Lavonia Dana M.D.   On: 01/27/2018 16:49    Cardiac Studies   Echocardiogram pending today   Patient Profile     72 y.o. male with pmhx of CAD s/p four-vessel CABG 12 years ago, HFrEF (35-40% on most recent echo), OSA, CVA, dementia, and alcohol abuse who presented with sudden onset chest pain. Troponin elevated to 3.04, EKG without acute ischemic changes.   Assessment & Plan    NSTEMI: S/p LHC yesterday with severe multivessel (total occulusion of LAD, critical stenosis of distal left main/ostial circumflex, and severe stenosis of RCA) and multi graft disease (patenet LIMA-LAD but total occlusion of SVG-OM, SVG-PDA, and SVG-diagonal).  -- S/p PCI to the left main / ostial circumflex with DES  -- Medical management for remaining CAD -- PCI to RCA was deferred due to the vessel being tortuous and carrying significant risk of dissection or complication. If patient fails medical therapy, can consider PCI of RCA.  -- S/p Plavix load 600 mg x 1 -- Continue Plavix, ASA, and Eliquis x 3 days then stop ASA -- Continue Lipitor 80 mg -- Continue pironolactone 25 mg daily  -- IV lasix 40 mg BID today  -- Holding losartan & carvedilol  -- Telemetry  -- PT/OT  Isolated Fever: 100.5 yesterday evening, afebrile today  -- CXR to rule out developing aspiration PNA -- UA -- SLP eval given esophageal spasms   For questions or updates, please  contact [facility] Please consult [email or web address] for contact info under Cardiology/STEMI.      Signed, Velna Ochs, MD  01/29/2018, 8:13 AM

## 2018-01-29 NOTE — Plan of Care (Signed)
  Problem: Clinical Measurements: Goal: Diagnostic test results will improve Outcome: Progressing   Problem: Clinical Measurements: Goal: Respiratory complications will improve Outcome: Progressing   Problem: Activity: Goal: Risk for activity intolerance will decrease Outcome: Progressing   Problem: Pain Managment: Goal: General experience of comfort will improve Outcome: Progressing

## 2018-01-29 NOTE — Care Management Note (Addendum)
Case Management Note Marvetta Gibbons RN,BSN Unit [facility] 1-22 Case Manager  [phone]  Patient Details  Name: ABHIMANYU CRUCES MRN: [id number] Date of Birth: 13-Jul-1946  Subjective/Objective:  Pt admitted with NSTEMI s/p cath on 01/28/18 with stent placements               Action/Plan: PTA Pt lived at [facility] with wife. PT/OT evals pending post cath. CM to follow for transition of care needs, pt followed by [facility] clinic in [place]- PCP is Dr. Felton Clinton, Sat[place]ia at the clinic is Jola Baptist- contact # is [phone]. [phone] (pager [phone])  Expected Discharge Date:  01/27/18               Expected Discharge Plan:  Assisted Living / Rest Home  In-House Referral:  Clinical Social Work  Discharge planning Services  CM Consult  Post Acute Care Choice:    Choice offered to:     DME Arranged:    DME Agency:     HH Arranged:    HH Agency:     Status of Service:  In process, will continue to follow  If discussed at Long Length of Stay Meetings, dates discussed:    Discharge Disposition:   Additional Comments:  Dawayne Patricia, RN 01/29/2018, 11:22 AM

## 2018-01-29 NOTE — Progress Notes (Signed)
CARDIAC REHAB PHASE I   PRE:  Rate/Rhythm: 92 SR  BP:  Supine: 116/57  Sitting:   Standing:    SaO2: 89% 3L  MODE:  Ambulation: few steps to chair ft   POST:  Rate/Rhythm: 93  BP:  Supine:   Sitting: 122/67  Standing:    SaO2: 90% 4L 1050-1136 Came to see if pt could walk. Pt's condom off and bed and pt wet. Changed gown and had pt stand with asst x 2 for RN to clean. Pt very weak. Able to move feet a few steps to recliner. Pt stated his CP was a 6 on scale which RN stated is normal for him today. Linen changed and new condom applied by his Therapist, sports. Put on 4L and sats at 90%. Will let PT evaluate and continue to follow.   Graylon Good, RN BSN  01/29/2018 11:33 AM

## 2018-01-29 NOTE — Progress Notes (Signed)
Patient transported to 3E23 via w/c. Alert and oriented x 4 and in stable condition. Continues to c/o chest tightness which is felt to be related to esophageal spasms. Nitroglycerin, Tylenol, and/or Morphine relieve his chest tightness. Appears to be worse after eating meals. No personal items at bedside. Report given to [place].

## 2018-01-29 NOTE — Progress Notes (Signed)
Pt. To remain on nasal cannula at this time. RN states pt. Has not been wearing bipap and that pt. Will continued to be monitored. Pt. sats currently in the mid to high 90's and pt. Is resting comfortably at this time.

## 2018-01-29 NOTE — Evaluation (Signed)
Clinical/Bedside Swallow Evaluation Patient Details  Name: [name] MRN: [id number] Date of Birth: [birth year]  Today's Date: 01/29/2018 Time: SLP Start Time (ACUTE ONLY): 1401 SLP Stop Time (ACUTE ONLY): 1417 SLP Time Calculation (min) (ACUTE ONLY): 16 min  Past Medical History:  Past Medical History:  Diagnosis Date  . AKI (acute kidney injury) (HCC)    a. 6/14: resolved after d/c ARB;  b. RA U/S 7/14: no RA stenosis  . Arthritis    of spine  . Atrial flutter (HCC)    a. 2008 - one episode in setting of sepsis/shock.  . Back pain    persistent  . Coronary artery disease    a. s/p CABG 2005. b. low risk nuc 2016.  Marland Kitchen Depression   . Diabetes mellitus    controled by diet  . Diverticulitis    a. s/p partial colectomy remotely.  Marland Kitchen GERD (gastroesophageal reflux disease)   . H/O alcohol abuse   . Headache(784.0)   . Hearing loss   . History of hiatal hernia   . Hx of echocardiogram    Echo 7/14:  Mod LVH, EF 55-60%, Gr 1 DD, mild MR, PASP 36  . Hyperlipidemia   . Hypertension   . Pulmonary embolism (HCC) 2005  . Renal stone 04/15/2014  . Seizures (HCC)   . Sleep apnea    does not use CPAP  . Stroke [facility])    Past Surgical History:  Past Surgical History:  Procedure Laterality Date  . APPENDECTOMY    . BACK SURGERY    . CARDIAC CATHETERIZATION    . CERVICAL FUSION    . CORONARY ARTERY BYPASS GRAFT  2005   LIMA graft to LAD,saphenous vein graft to diag.,circumflex, marginal,and to the RCA  . CORONARY STENT INTERVENTION N/A 01/28/2018   Procedure: CORONARY STENT INTERVENTION;  Surgeon: Tonny Bollman, MD;  Location: [facility] INVASIVE CV LAB;  Service: Cardiovascular;  Laterality: N/A;  . CYSTOSCOPY/RETROGRADE/URETEROSCOPY/STONE EXTRACTION WITH BASKET Left 04/16/2014   Procedure: CYSTOSCOPY/RETROGRADE/LEFT URETEROSCOPY/STONE EXTRACTION WITH BASKET AND LASER LITHOTRIPSY;  Surgeon: Anner Crete, MD;  Location: [facility] ORS;  Service: Urology;  Laterality: Left;  With STENT  .  ESOPHAGOGASTRODUODENOSCOPY  02/10/2012   Procedure: ESOPHAGOGASTRODUODENOSCOPY (EGD);  Surgeon: Florencia Reasons, MD;  Location: [facility] ENDOSCOPY;  Service: Endoscopy;  Laterality: N/A;  . ESOPHAGOGASTRODUODENOSCOPY  06/29/2012   Procedure: ESOPHAGOGASTRODUODENOSCOPY (EGD);  Surgeon: Vertell Novak., MD;  Location: [facility] ENDOSCOPY;  Service: Endoscopy;  Laterality: N/A;  . FOREIGN BODY REMOVAL  02/10/2012   Procedure: FOREIGN BODY REMOVAL;  Surgeon: Florencia Reasons, MD;  Location: [facility] ENDOSCOPY;  Service: Endoscopy;  Laterality: N/A;  . FOREIGN BODY REMOVAL  06/29/2012   Procedure: FOREIGN BODY REMOVAL;  Surgeon: Vertell Novak., MD;  Location: [facility] ENDOSCOPY;  Service: Endoscopy;  Laterality: N/A;  . LEFT HEART CATH AND CORS/GRAFTS ANGIOGRAPHY N/A 01/28/2018   Procedure: LEFT HEART CATH AND CORS/GRAFTS ANGIOGRAPHY;  Surgeon: Tonny Bollman, MD;  Location: [facility] INVASIVE CV LAB;  Service: Cardiovascular;  Laterality: N/A;  . LEFT HEART CATHETERIZATION WITH CORONARY ANGIOGRAM N/A 04/02/2012   Procedure: LEFT HEART CATHETERIZATION WITH CORONARY ANGIOGRAM;  Surgeon: Lesleigh Noe, MD;  Location: [facility] CATH LAB;  Service: Cardiovascular;  Laterality: N/A;  . LOOP RECORDER IMPLANT N/A 11/17/2013   Procedure: LOOP RECORDER IMPLANT;  Surgeon: Duke Salvia, MD;  Location: [facility] CATH LAB;  Service: Cardiovascular;  Laterality: N/A;  . ORCHIECTOMY    . PARTIAL COLECTOMY     for diverticuli  .  SAVORY DILATION  06/29/2012   Procedure: SAVORY DILATION;  Surgeon: Vertell Novak., MD;  Location: [facility] ENDOSCOPY;  Service: Endoscopy;  Laterality: N/A;  . TEE WITHOUT CARDIOVERSION N/A 11/17/2013   Procedure: TRANSESOPHAGEAL ECHOCARDIOGRAM (TEE);  Surgeon: Laurey Morale, MD;  Location: [facility] ENDOSCOPY;  Service: Cardiovascular;  Laterality: N/A;  . UMBILICAL HERNIA REPAIR     HPI:  [name] is a 72 yo M 72 y.o. male with hx of CAD s/p four-vessel CABG 12 years ago, HFrEF (35-40% on most recent echo), OSA, CVA, dementia,  and alcohol abuse who presented with sudden onset chest pain. Pt is known to this service from prior hospitalizations.  Pt has hx of dysphagia which appears esophageal in nature.  Pt underwent MBSS in [place] of this year which revealed functional oropharyngeal swallowing.  A Barium Swallow completed during that admission showed "no evidence of food bolus or food impaction. It showed significant tertiary contraction. No evidence of stricture or mass"   Assessment / Plan / Recommendation Clinical Impression  Pt presents with concern for esophageal dysphagia with near constant belching and regurgitation which occurred prior to administration of PO trials and throughout assessment. This poses a risk of post prandial aspiration.  Pt states that this is his baseline.  Pt states that he experiences more backflow with liquid than with solids.  GI recs during previous admission include "Follow-up with Dr. Randa Evens in 6-8 weeks after discharge to monitor LFTs and hemoglobin as well as further management of his chronic dysphagia." He endorses seeing a GI doctor but does not recall receiving diagnosis or recommendations.  RN states that pt/family reported esophageal spasms. On SLP arrival, pt was in side-lying position expectorating frothy secretions into cup.  Pt was noted to bring up and expectorate water immediate following trials.  There was some backflow of solid as well. Pt may benefit from having suction available in room to help manage backflow and regurgitation.  Oropharyngeal swallow function appears adequate as assessed clinically.  Pt tolerated all consistencies trialed with no clincal s/s of aspiration and adequate oral clearance of solids.  Pt states that mastication is difficult 2/2 edentulism and that he typically eats softer foods.  Discussed diet preference with pt who is agreeable to mechanical soft diet for ease of mastication.  SLP Visit Diagnosis: Dysphagia, unspecified (R13.10)    Aspiration  Risk  Mild aspiration risk    Diet Recommendation Dysphagia 3 (Mech soft);Thin liquid   Liquid Administration via: Cup;Straw Medication Administration: Whole meds with liquid Supervision: Patient able to self feed Compensations: Slow rate;Small sips/bites;Follow solids with liquid Postural Changes: Seated upright at 90 degrees;Remain upright for at least 30 minutes after po intake    Other  Recommendations Recommended Consults: Consider GI evaluation Oral Care Recommendations: Oral care BID   Follow up Recommendations None      Frequency and Duration   N/A         Prognosis   N/A     Swallow Study   General HPI: [name] is a 72 yo M 72 y.o. male with hx of CAD s/p four-vessel CABG 12 years ago, HFrEF (35-40% on most recent echo), OSA, CVA, dementia, and alcohol abuse who presented with sudden onset chest pain. Pt is known to this service from prior hospitalizations.  Pt has hx of dysphagia which appears esophageal in nature.  Pt underwent MBSS in [place] of this year which revealed functional oropharyngeal swallowing.  A Barium Swallow completed during that admission showed "no  evidence of food bolus or food impaction. It showed significant tertiary contraction. No evidence of stricture or mass" Type of Study: Bedside Swallow Evaluation Previous Swallow Assessment: MBSS 09/30/17: adequate oropharyngeal swallowing with concern for primary esophageal dysphagia 2/2 esophageal stasis noted on sweep Diet Prior to this Study: Regular Temperature Spikes Noted: No Respiratory Status: Nasal cannula History of Recent Intubation: No Behavior/Cognition: Alert;Cooperative;Pleasant mood Oral Cavity Assessment: Within Functional Limits Oral Care Completed by SLP: No Oral Cavity - Dentition: Edentulous(top arch) Vision: Functional for self-feeding Self-Feeding Abilities: Able to feed self Patient Positioning: Upright in bed Baseline Vocal Quality: Normal Volitional Cough:  Strong Volitional Swallow: Able to elicit    Oral/Motor/Sensory Function Overall Oral Motor/Sensory Function: Mild impairment Facial ROM: Reduced left Facial Symmetry: Within Functional Limits Lingual ROM: Reduced right;Reduced left Lingual Strength: Reduced Velum: Within Functional Limits   Thin Liquid Thin Liquid: Within functional limits Presentation: Straw Other Comments: regurgitated after swallow    Puree Puree: Within functional limits Presentation: Spoon   Solid   GO   Solid: Impaired Oral Phase Impairments: Impaired mastication(2/2 edentulism) Oral Phase Functional Implications: Prolonged oral transit Other Comments: regurgitation/expectoration        Danice Dippolito E Violetta Lavalle, MA, CCC-SLP 01/29/2018,2:37 PM

## 2018-01-30 DIAGNOSIS — I249 Acute ischemic heart disease, unspecified: Secondary | ICD-10-CM

## 2018-01-30 DIAGNOSIS — R1310 Dysphagia, unspecified: Secondary | ICD-10-CM

## 2018-01-30 LAB — GLUCOSE, CAPILLARY
GLUCOSE-CAPILLARY: 159 mg/dL — AB (ref 65–99)
Glucose-Capillary: 123 mg/dL — ABNORMAL HIGH (ref 65–99)
Glucose-Capillary: 205 mg/dL — ABNORMAL HIGH (ref 65–99)

## 2018-01-30 MED ORDER — ACETAMINOPHEN-CODEINE #3 300-30 MG PO TABS: 1.0000 | ORAL_TABLET | Freq: Four times a day (QID) | ORAL | Status: DC | PRN

## 2018-01-30 MED ORDER — PANTOPRAZOLE SODIUM 40 MG PO TBEC
40.0000 mg | DELAYED_RELEASE_TABLET | Freq: Two times a day (BID) | ORAL | Status: DC
  Administered 2018-01-30 – 2018-01-31 (×3): 40 mg via ORAL
  Filled 2018-01-30 (×3): qty 1

## 2018-01-30 NOTE — Evaluation (Signed)
Physical Therapy Evaluation Patient Details Name: [name] MRN: [id number] DOB: 1946-06-01 Today's Date: 01/30/2018   History of Present Illness  Pt is a 72 y/o male admitted secondary to chest pain. Pt is s/p L heart cath and stent intervention. PMH includes CAD s/p CABG, DM, a flutter, CVA, HTN, PE, and dementia.   Clinical Impression  Pt admitted secondary to problem above with deficits below. PTA, pt was at ALF and ambulating with RW with assist. Required min A +2 with HHA this session to transfer to chair. Per pt and wife, staff at ALF able to assist pt as needed. Will continue to follow acutely to maximize functional mobility independence and safety.     Follow Up Recommendations Home health PT;Other (comment);Supervision for mobility/OOB(Return to ALF )    Equipment Recommendations  None recommended by PT    Recommendations for Other Services       Precautions / Restrictions Precautions Precautions: Fall Restrictions Weight Bearing Restrictions: No      Mobility  Bed Mobility               General bed mobility comments: Sitting at EOB upon entry   Transfers Overall transfer level: Needs assistance Equipment used: 2 person hand held assist Transfers: Sit to/from Stand;Stand Pivot Transfers Sit to Stand: Min assist;+2 physical assistance Stand pivot transfers: Min assist;+2 physical assistance       General transfer comment: Min A +2 for lift assist and steadying. Min A +2 for steadying throughout transfer to recliner. Further mobility deferred as pt wanting to finish his lunch.   Ambulation/Gait                Stairs            Wheelchair Mobility    Modified Rankin (Stroke Patients Only)       Balance Overall balance assessment: Needs assistance Sitting-balance support: No upper extremity supported;Feet supported Sitting balance-Leahy Scale: Good     Standing balance support: Bilateral upper extremity supported;During functional  activity Standing balance-Leahy Scale: Poor Standing balance comment: Reliant on BUE support.                              Pertinent Vitals/Pain Pain Assessment: 0-10 Pain Score: 6  Pain Location: lower back Pain Descriptors / Indicators: Constant;Discomfort Pain Intervention(s): Limited activity within patient's tolerance;Monitored during session;Patient requesting pain meds-RN notified    Home Living Family/patient expects to be discharged to:: Assisted living([place] )               Home Equipment: Shower seat;Grab bars - tub/shower;Hand held shower head;Walker - 4 wheels;Walker - 2 wheels;Adaptive equipment;Other (comment)      Prior Function Level of Independence: Needs assistance   Gait / Transfers Assistance Needed: uses RW and assistance for mobility  ADL's / Homemaking Assistance Needed: aide assisting with ADL        Hand Dominance   Dominant Hand: Left    Extremity/Trunk Assessment   Upper Extremity Assessment Upper Extremity Assessment: Defer to OT evaluation    Lower Extremity Assessment Lower Extremity Assessment: Generalized weakness    Cervical / Trunk Assessment Cervical / Trunk Assessment: Kyphotic  Communication   Communication: HOH  Cognition Arousal/Alertness: Awake/alert Behavior During Therapy: Flat affect Overall Cognitive Status: History of cognitive impairments - at baseline  General Comments General comments (skin integrity, edema, etc.): Pt's wife present at end of session and confirmed all PLOF and ALF information.     Exercises     Assessment/Plan    PT Assessment Patient needs continued PT services  PT Problem List Decreased strength;Decreased balance;Decreased mobility;Decreased cognition;Decreased knowledge of use of DME       PT Treatment Interventions DME instruction;Gait training;Therapeutic exercise;Therapeutic activities;Functional mobility  training;Balance training;Patient/family education    PT Goals (Current goals can be found in the Care Plan section)  Acute Rehab PT Goals Patient Stated Goal: to walk better  PT Goal Formulation: With patient Time For Goal Achievement: 02/13/18 Potential to Achieve Goals: Good    Frequency Min 3X/week   Barriers to discharge        Co-evaluation PT/OT/SLP Co-Evaluation/Treatment: Yes Reason for Co-Treatment: To address functional/ADL transfers;For patient/therapist safety PT goals addressed during session: Mobility/safety with mobility;Balance         AM-PAC PT "6 Clicks" Daily Activity  Outcome Measure Difficulty turning over in bed (including adjusting bedclothes, sheets and blankets)?: A Little Difficulty moving from lying on back to sitting on the side of the bed? : A Little Difficulty sitting down on and standing up from a chair with arms (e.g., wheelchair, bedside commode, etc,.)?: Unable Help needed moving to and from a bed to chair (including a wheelchair)?: A Little Help needed walking in hospital room?: A Little Help needed climbing 3-5 steps with a railing? : A Lot 6 Click Score: 15    End of Session Equipment Utilized During Treatment: Gait belt Activity Tolerance: Patient tolerated treatment well Patient left: in chair;with chair alarm set;with call bell/phone within reach;with family/visitor present Nurse Communication: Mobility status PT Visit Diagnosis: Unsteadiness on feet (R26.81);Other abnormalities of gait and mobility (R26.89)    Time: 1325-1350 PT Time Calculation (min) (ACUTE ONLY): 25 min   Charges:   PT Evaluation $PT Eval Low Complexity: 1 Low     PT G Codes:        Leighton Ruff, PT, DPT  Acute Rehabilitation Services  Pager: [phone]   Rudean Hitt 01/30/2018, 2:05 PM

## 2018-01-30 NOTE — Consult Note (Signed)
Referring Provider: Dr. Renie Ora Primary Care Physician:  Bernell List, MD Primary Gastroenterologist:  Dr. [name]  Reason for Consultation:  Dysphagia  HPI: [name] is a 72 y.o. male who is s/p cardiac cath on 01/28/18 with coronary stenting and his vein grafts were noted to be occluded. He is seen for a consult due to dysphagia. He has a chronic history of dysphagia with known esophageal dysmotility. S/P balloon dilation (16.5 mm) of an esophageal stricture in 2015 by Dr. [name]. Barium swallow in 2/19 showed esophageal dysmotility without a definite stricture. Last saw Dr. [name] in the office in March who reported that speech path had put him on a dysphagia 3 diet. He reports to me that everytime he eats meat he feels like the food hangs up. Denies any trouble swallowing liquids. Denies N/V/heartburn. Reports frequent belching. Denies chest pain with eating. Reports that he is tolerating his dysphagia 3 diet but he has dementia so his history is not reliable. Nurse reports that he is tolerating his meals. He is on Plavix, ASA, and Eliquis (hx of stroke).    Past Medical History:  Diagnosis Date  . AKI (acute kidney injury) ([place])    a. 6/14: resolved after d/c ARB;  b. RA U/S 7/14: no RA stenosis  . Arthritis    of spine  . Atrial flutter ([place])    a. 2008 - one episode in setting of sepsis/shock.  . Back pain    persistent  . Coronary artery disease    a. s/p CABG 2005. b. low risk nuc 2016.  [place] Kitchen Depression   . Diabetes mellitus    controled by diet  . Diverticulitis    a. s/p partial colectomy remotely.  Marland Kitchen GERD (gastroesophageal reflux disease)   . H/O alcohol abuse   . Headache(784.0)   . Hearing loss   . History of hiatal hernia   . Hx of echocardiogram    Echo 7/14:  Mod LVH, EF 55-60%, Gr 1 DD, mild MR, PASP 36  . Hyperlipidemia   . Hypertension   . Pulmonary embolism ([place]) 2005  . Renal stone 04/15/2014  . Seizures ([place])   . Sleep apnea    does not use CPAP  .  Stroke [facility])     Past Surgical History:  Procedure Laterality Date  . APPENDECTOMY    . BACK SURGERY    . CARDIAC CATHETERIZATION    . CERVICAL FUSION    . CORONARY ARTERY BYPASS GRAFT  2005   LIMA graft to LAD,saphenous vein graft to diag.,circumflex, marginal,and to the RCA  . CORONARY STENT INTERVENTION N/A 01/28/2018   Procedure: CORONARY STENT INTERVENTION;  Surgeon: Sherren Mocha, MD;  Location: [place] CV [facility];  Service: Cardiovascular;  Laterality: N/A;  . CYSTOSCOPY/RETROGRADE/URETEROSCOPY/STONE EXTRACTION WITH BASKET Left 04/16/2014   Procedure: CYSTOSCOPY/RETROGRADE/LEFT URETEROSCOPY/STONE EXTRACTION WITH BASKET AND LASER LITHOTRIPSY;  Surgeon: Malka So, MD;  Location: [facility];  Service: Urology;  Laterality: Left;  With STENT  . ESOPHAGOGASTRODUODENOSCOPY  02/10/2012   Procedure: ESOPHAGOGASTRODUODENOSCOPY (EGD);  Surgeon: Cleotis Nipper, MD;  Location[facility] ENDOSCOPY;  Service: Endoscopy;  Laterality: N/A;  . ESOPHAGOGASTRODUODENOSCOPY  06/29/2012   Procedure: ESOPHAGOGASTRODUODENOSCOPY (EGD);  Surgeon: Winfield Cunas., MD;  Location: [facility];  Service: Endoscopy;  Laterality: N/A;  . FOREIGN BODY REMOVAL  02/10/2012   Procedure: FOREIGN BODY REMOVAL;  Surgeon: Cleotis Nipper, MD;  Location: [place];  Service: Endoscopy;  Laterality: N/A;  . FOREIGN BODY REMOVAL  06/29/2012  Procedure: FOREIGN BODY REMOVAL;  Surgeon: Winfield Cunas., MD;  Location: [facility] ENDOSCOPY;  Service: Endoscopy;  Laterality: N/A;  . LEFT HEART CATH AND CORS/GRAFTS ANGIOGRAPHY N/A 01/28/2018   Procedure: LEFT HEART CATH AND CORS/GRAFTS ANGIOGRAPHY;  Surgeon: Sherren Mocha, MD;  Location: [place] CV LAB;  Service: Cardiovascular;  Laterality: N/A;  . LEFT HEART CATHETERIZATION WITH CORONARY ANGIOGRAM N/A 04/02/2012   Procedure: LEFT HEART CATHETERIZATION WITH CORONARY ANGIOGRAM;  Surgeon: Sinclair Grooms, MD;  Location: [facility] CATH LAB;  Service: Cardiovascular;  Laterality: N/A;  .  LOOP RECORDER IMPLANT N/A 11/17/2013   Procedure: LOOP RECORDER IMPLANT;  Surgeon: Deboraha Sprang, MD;  Location: [facility] CATH LAB;  Service: Cardiovascular;  Laterality: N/A;  . ORCHIECTOMY    . PARTIAL COLECTOMY     for diverticuli  . SAVORY DILATION  06/29/2012   Procedure: SAVORY DILATION;  Surgeon: Winfield Cunas., MD;  Location: [facility];  Service: Endoscopy;  Laterality: N/A;  . TEE WITHOUT CARDIOVERSION N/A 11/17/2013   Procedure: TRANSESOPHAGEAL ECHOCARDIOGRAM (TEE);  Surgeon: Larey Dresser, MD;  Location: [place];  Service: Cardiovascular;  Laterality: N/A;  . [place]      Prior to Admission medications   Medication Sig Start Date End Date Taking? Authorizing Provider  acetaminophen (TYLENOL) 325 MG tablet Take 2 tablets (650 mg total) by mouth every 4 (four) hours as needed for mild pain, fever or headache. 10/10/17  Yes Cherene Altes, MD  apixaban (ELIQUIS) 5 MG TABS tablet Take 1 tablet (5 mg total) by mouth 2 (two) times daily. 10/10/17  Yes Cherene Altes, MD  atorvastatin (LIPITOR) 40 MG tablet Take 1 tablet (40 mg total) by mouth daily at 6 PM. 10/10/17  Yes Cherene Altes, MD  carvedilol (COREG) 3.125 MG tablet Take 1 tablet (3.125 mg total) by mouth 2 (two) times daily with a meal. 10/10/17  Yes Cherene Altes, MD  donepezil (ARICEPT) 10 MG tablet Take 1 tablet (10 mg total) by mouth daily. Patient taking differently: Take 10 mg by mouth every evening.  10/11/17  Yes Cherene Altes, MD  Fluocinolone Acetonide 0.01 % OIL Apply 1 application topically daily as needed (itching). Apply to scalp   Yes [provider]  FLUoxetine (PROZAC) 40 MG capsule Take 1 capsule (40 mg total) by mouth daily. 10/11/17  Yes Cherene Altes, MD  fluticasone (FLONASE) 50 MCG/ACT nasal spray Place 1 spray into both nostrils 2 (two) times daily as needed for rhinitis. 10/10/17  Yes Cherene Altes, MD  furosemide (LASIX) 20 MG tablet Take 60 mg by  mouth daily.   Yes [provider]  gabapentin (NEURONTIN) 100 MG capsule Take 100 mg by mouth at bedtime.    Yes [provider]  gabapentin (NEURONTIN) 100 MG capsule Take 100-300 mg by mouth at bedtime as needed (for itching).   Yes [provider]  hydroxypropyl methylcellulose / hypromellose (ISOPTO TEARS / GONIOVISC) 2.5 % ophthalmic solution Place 1 drop into both eyes 2 (two) times daily as needed for dry eyes.   Yes [provider]  hyoscyamine (LEVSIN SL) 0.125 MG SL tablet Place 0.125 mg under the tongue every 4 (four) hours as needed for cramping.    Yes [provider]  ipratropium-albuterol (DUONEB) 0.5-2.5 (3) MG/3ML SOLN Take 3 mLs by nebulization 2 (two) times daily.  12/10/17  Yes [provider]  lamoTRIgine (LAMICTAL) 100 MG tablet Take 1 tablet (100 mg total)  by mouth 2 (two) times daily. 10/10/17  Yes Cherene Altes, MD  losartan (COZAAR) 25 MG tablet Take 12.5 mg by mouth daily.   Yes [provider]  Melatonin 3 MG TABS Take 3 mg by mouth at bedtime.    Yes [provider]  Multiple Vitamin (MULTIVITAMIN WITH MINERALS) TABS tablet Take 1 tablet by mouth daily.   Yes [provider]  potassium chloride (K-DUR) 10 MEQ tablet Take 10 mEq by mouth daily.  01/03/18  Yes [provider]  psyllium (REGULOID) 0.52 g capsule Take 0.52 g by mouth 2 (two) times daily.   Yes [provider]  ranitidine (ZANTAC) 150 MG tablet Take 150 mg by mouth daily.  01/03/18  Yes [provider]  spironolactone (ALDACTONE) 25 MG tablet Take 1 tablet (25 mg total) by mouth daily. 10/11/17  Yes Cherene Altes, MD  traZODone (DESYREL) 100 MG tablet Take 1 tablet (100 mg total) by mouth at bedtime. 10/10/17  Yes Cherene Altes, MD  aspirin EC 81 MG EC tablet Take 1 tablet (81 mg total) by mouth daily. Patient not taking: Reported on 01/26/2018 10/11/17   Cherene Altes, MD  insulin aspart  (NOVOLOG) 100 UNIT/ML injection Inject 0-15 Units into the skin 3 (three) times daily with meals. Patient not taking: Reported on 01/26/2018 10/10/17   Cherene Altes, MD  nitroGLYCERIN (NITROSTAT) 0.4 MG SL tablet Place 1 tablet (0.4 mg total) under the tongue every 5 (five) minutes as needed (as needed for chest pain or with symptoms of esophageal spasm). Patient not taking: Reported on 01/26/2018 10/10/17   Cherene Altes, MD  OXYGEN Inhale 2.5 L into the lungs.    [provider]    Scheduled Meds: . apixaban  5 mg Oral BID  . aspirin EC  81 mg Oral Daily  . atorvastatin  80 mg Oral q1800  . clopidogrel  75 mg Oral Q breakfast  . donepezil  10 mg Oral Daily  . famotidine  10 mg Oral Daily  . FLUoxetine  40 mg Oral Daily  . furosemide  60 mg Oral Daily  . gabapentin  100 mg Oral QHS  . insulin aspart  0-15 Units Subcutaneous TID WC  . lamoTRIgine  100 mg Oral BID  . mouth rinse  15 mL Mouth Rinse BID  . Melatonin  3 mg Oral QHS  . polycarbophil  625 mg Oral BID  . sodium chloride flush  3 mL Intravenous Q12H  . spironolactone  25 mg Oral Daily  . traZODone  100 mg Oral QHS   Continuous Infusions: . sodium chloride     PRN Meds:.sodium chloride, acetaminophen, acetaminophen-codeine, gabapentin, ipratropium-albuterol, morphine injection, nitroGLYCERIN, ondansetron (ZOFRAN) IV, promethazine, sodium chloride flush  Allergies as of 01/26/2018 - Review Complete 01/26/2018  Allergen Reaction Noted  . Cephalexin  12/07/2013  . Lac bovis Other (See Comments) 04/28/2013  . Other  02/11/2015  . Pentazocine Other (See Comments) 05/19/2014  . Tizanidine  02/11/2015  . Cephalexin  06/12/2011  . Doxycycline  06/12/2011  . Pentazocine lactate  06/12/2011  . Tape Other (See Comments) 02/11/2015  . Zanaflex [tizanidine hcl] Other (See Comments) 02/11/2015    Family History  Problem Relation Age of Onset  . Emphysema Mother        was a smoker  . Asthma Mother   .  Hypertension Mother   . Heart disease Father   . Prostate cancer Paternal Grandfather   . Pancreatic  cancer Paternal Uncle   . Stroke Paternal Grandmother   . Heart attack Neg Hx     Social History   Socioeconomic History  . Marital status: Married    Spouse name: Clinical research associate  . Number of children: 2  . Years of education: college  . Highest education level: Not on file  Occupational History  . 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 Financial resource strain: Not on file  . Food insecurity:    Worry: Not on file    Inability: Not on file  . Transportation needs:    Medical: Not on file    Non-medical: Not on file  Tobacco Use  . Smoking status: Never Smoker  . Smokeless tobacco: Never Used  Substance and Sexual Activity  . Alcohol use: No    Comment: pt states "I used to but not anymore"  . Drug use: No  . Sexual activity: Not Currently  Lifestyle  . Physical activity:    Days per week: Not on file    Minutes per session: Not on file  . Stress: Not on file  Relationships  . Social connections:    Talks on phone: Not on file    Gets together: Not on file    Attends religious service: Not on file    Active member of club or organization: Not on file    Attends meetings of clubs or organizations: Not on file    Relationship status: Not on file  . Intimate partner violence:    Fear of current or ex partner: Not on file    Emotionally abused: Not on file    Physically abused: Not on file    Forced sexual activity: Not on file  Other Topics Concern  . Not on file  Social History Narrative   Patient lives at home with  wife      Patient drinks 2 cups of coffee a day.patient is left handed    Review of Systems: All negative except as stated above in HPI.  Physical Exam: Vital signs: Vitals:   01/30/18 0602 01/30/18 0922  BP: 113/66 107/61  Pulse: 86 84  Resp: 18   Temp: 98.4 F (36.9 C)   SpO2: 94%    Last  BM Date: 01/29/18 General:   Lethargic, elderly, no acute distress, well-developed Head: normocephalic, atraumatic Eyes: anicteric sclera ENT: oropharynx clear Neck: supple, nontender Lungs:  Clear throughout to auscultation.   No wheezes, crackles, or rhonchi. No acute distress. Heart:  Regular rate and rhythm; no murmurs, clicks, rubs,  or gallops. Abdomen: soft, nontender, nondistended, +BS  Rectal:  Deferred Ext: no edema  GI:  Lab Results: Recent Labs    01/27/18 1611 01/29/18 0250  WBC 9.3 10.1  HGB 9.6* 9.5*  HCT 30.2* 29.4*  PLT 233 202   BMET Recent Labs    01/28/18 0420 01/29/18 0250  NA 136 137  K 3.9 3.4*  CL 100* 97*  CO2 26 29  GLUCOSE 133* 140*  BUN 20 21*  CREATININE 1.23 1.12  CALCIUM 8.6* 8.4*   LFT No results for input(s): PROT, ALBUMIN, AST, ALT, ALKPHOS, BILITOT, BILIDIR, IBILI in the last 72 hours. PT/INR No results for input(s): LABPROT, INR in the last 72 hours.   Studies/Results: Dg Chest Port 1 View  Result Date: 01/29/2018 CLINICAL DATA:  Cough. EXAM: PORTABLE CHEST 1 VIEW COMPARISON:  01/27/2018 FINDINGS: 1223 hours. The cardio pericardial silhouette  is enlarged. Minimal atelectasis noted left lung base. Calcified pleural plaques again evident. The visualized bony structures of the thorax are intact. Telemetry leads overlie the chest. Loop recorder overlies the left heart. IMPRESSION: Stable. Cardiomegaly with mild vascular congestion and bilateral calcified pleural plaques. Left base atelectasis. Electronically Signed   By: Misty Stanley M.D.   On: 01/29/2018 13:22    Impression/Plan: 72 yo with CAD and s/p coronary stenting on 01/28/18 and history of CVA, dementia who has chronic dysphagia due to esophageal dysmotility. His dysphagia is a chronic problem and I do not think an EGD is needed. Doubt an esophageal stricture causing his symptoms and likely due to his known dysmotility. Would manage conservatively with continued dysphagia 3 diet  with aspiration precautions and agree with speech path consult. Change from Famotidine to PPI PO BID. Dr. Paulita Fujita will f/u tomorrow.    LOS: 3 days   [place] C.  01/30/2018, 11:38 AM  Questions please call [phone]

## 2018-01-30 NOTE — Evaluation (Signed)
Occupational Therapy Evaluation and Discharge Patient Details Name: [name] MRN: [id number] DOB: 03-29-1946 Today's Date: 01/30/2018    History of Present Illness Pt is a 72 y/o male admitted secondary to chest pain. Pt is s/p L heart cath and stent intervention. PMH includes CAD s/p CABG, DM, a flutter, CVA, HTN, PE, and dementia.    Clinical Impression   PTA Pt getting assist from ALF for bathing and dressing, getting assist for transfers from ALF staff and working with PT to improve mobility with walker. Pt is currently at baseline for ADL. He was able to self-feed and perform grooming with set up from seated position. Thank you for the opportunity to serve this patient, as Pt is at baseline, OT to discontinue orders at this time.     Follow Up Recommendations  No OT follow up;Supervision/Assistance - 24 hour(Return to ALF with 24 hour assist)    Equipment Recommendations  None recommended by OT(Pt has appropriate DME at ALF)    Recommendations for Other Services       Precautions / Restrictions Precautions Precautions: Fall Restrictions Weight Bearing Restrictions: No      Mobility Bed Mobility               General bed mobility comments: Sitting at EOB upon entry   Transfers Overall transfer level: Needs assistance Equipment used: 2 person hand held assist Transfers: Sit to/from Stand;Stand Pivot Transfers Sit to Stand: Min assist;+2 physical assistance Stand pivot transfers: Min assist;+2 physical assistance       General transfer comment: Min A +2 for lift assist and steadying. Min A +2 for steadying throughout transfer to recliner. Further mobility deferred as pt wanting to finish his lunch.     Balance Overall balance assessment: Needs assistance Sitting-balance support: No upper extremity supported;Feet supported Sitting balance-Leahy Scale: Good     Standing balance support: Bilateral upper extremity supported;During functional  activity Standing balance-Leahy Scale: Poor Standing balance comment: Reliant on BUE support.                            ADL either performed or assessed with clinical judgement   ADL Overall ADL's : At baseline                                       General ADL Comments: Pt able to self-feed and perform grooming tasks with set up. Assist for transfers and Pt gets assist for bathing/dressing at baseline.     Vision Baseline Vision/History: No visual deficits Patient Visual Report: No change from baseline       Perception     Praxis      Pertinent Vitals/Pain Pain Assessment: 0-10 Pain Score: 6  Pain Location: lower back Pain Descriptors / Indicators: Constant;Discomfort Pain Intervention(s): Limited activity within patient's tolerance;Monitored during session;Repositioned     Hand Dominance Left   Extremity/Trunk Assessment Upper Extremity Assessment Upper Extremity Assessment: Generalized weakness   Lower Extremity Assessment Lower Extremity Assessment: Defer to PT evaluation   Cervical / Trunk Assessment Cervical / Trunk Assessment: Kyphotic   Communication Communication Communication: HOH   Cognition Arousal/Alertness: Awake/alert Behavior During Therapy: Flat affect Overall Cognitive Status: History of cognitive impairments - at baseline  General Comments  Pt's wife present at end of session and confirmed all PLOF and ALF information.     Exercises     Shoulder Instructions      Home Living Family/patient expects to be discharged to:: Assisted living([place] )                             Home Equipment: Shower seat;Grab bars - tub/shower;Hand held shower head;Walker - 4 wheels;Walker - 2 wheels;Adaptive equipment;Other (comment) Adaptive Equipment: Reacher        Prior Functioning/Environment Level of Independence: Needs assistance  Gait / Transfers  Assistance Needed: uses RW and assistance for mobility ADL's / Homemaking Assistance Needed: aide assisting with ADL Communication / Swallowing Assistance Needed: HOH          OT Problem List: Impaired balance (sitting and/or standing);Decreased activity tolerance      OT Treatment/Interventions:      OT Goals(Current goals can be found in the care plan section) Acute Rehab OT Goals Patient Stated Goal: to walk better  OT Goal Formulation: With patient Time For Goal Achievement: 02/13/18 Potential to Achieve Goals: Good  OT Frequency:     Barriers to D/C:            Co-evaluation PT/OT/SLP Co-Evaluation/Treatment: Yes Reason for Co-Treatment: For patient/therapist safety;To address functional/ADL transfers;Other (comment)(advised by RN) PT goals addressed during session: Mobility/safety with mobility OT goals addressed during session: ADL's and self-care      AM-PAC PT "6 Clicks" Daily Activity     Outcome Measure Help from another person eating meals?: None Help from another person taking care of personal grooming?: A Little Help from another person toileting, which includes using toliet, bedpan, or urinal?: A Lot Help from another person bathing (including washing, rinsing, drying)?: A Lot Help from another person to put on and taking off regular upper body clothing?: A Lot Help from another person to put on and taking off regular lower body clothing?: A Lot 6 Click Score: 15   End of Session Equipment Utilized During Treatment: Gait belt;Oxygen(4L) Nurse Communication: Mobility status  Activity Tolerance: Patient tolerated treatment well Patient left: in chair;with chair alarm set;with call bell/phone within reach;with family/visitor present  OT Visit Diagnosis: Muscle weakness (generalized) (M62.81);Unsteadiness on feet (R26.81);Other abnormalities of gait and mobility (R26.89)                Time: 1325-1350 OT Time Calculation (min): 25 min Charges:  OT  General Charges $OT Visit: 1 Visit OT Evaluation $OT Eval Moderate Complexity: 1 Mod G-Codes:     Hulda Humphrey OTR/L [place] 01/30/2018, 4:32 PM

## 2018-01-30 NOTE — Discharge Instructions (Signed)

## 2018-01-30 NOTE — Progress Notes (Signed)
Progress Note  Patient Name: [name] Date of Encounter: 01/30/2018  Primary Cardiologist: Peter Martinique, MD   Subjective   Complaining of back pain today. Continues to have post prandial chest pain, currently chest pain free. Denies dysphagia / odynophagia.   Inpatient Medications    Scheduled Meds: . apixaban  5 mg Oral BID  . aspirin EC  81 mg Oral Daily  . atorvastatin  80 mg Oral q1800  . clopidogrel  75 mg Oral Q breakfast  . donepezil  10 mg Oral Daily  . famotidine  10 mg Oral Daily  . FLUoxetine  40 mg Oral Daily  . furosemide  60 mg Oral Daily  . gabapentin  100 mg Oral QHS  . insulin aspart  0-15 Units Subcutaneous TID WC  . lamoTRIgine  100 mg Oral BID  . mouth rinse  15 mL Mouth Rinse BID  . Melatonin  3 mg Oral QHS  . polycarbophil  625 mg Oral BID  . sodium chloride flush  3 mL Intravenous Q12H  . spironolactone  25 mg Oral Daily  . traZODone  100 mg Oral QHS   Continuous Infusions: . sodium chloride     PRN Meds: sodium chloride, acetaminophen, gabapentin, ipratropium-albuterol, morphine injection, nitroGLYCERIN, ondansetron (ZOFRAN) IV, promethazine, sodium chloride flush   Vital Signs    Vitals:   01/29/18 2142 01/30/18 0311 01/30/18 0602 01/30/18 0922  BP:   113/66 107/61  Pulse:   86 84  Resp: 18  18   Temp:   98.4 F (36.9 C)   TempSrc:   Oral   SpO2:   94%   Weight:  189 lb (85.7 kg)    Height:        Intake/Output Summary (Last 24 hours) at 01/30/2018 0927 Last data filed at 01/30/2018 1638 Gross per 24 hour  Intake 960 ml  Output 2050 ml  Net -1090 ml   Filed Weights   01/29/18 0500 01/29/18 1800 01/30/18 0311  Weight: 195 lb 1.7 oz (88.5 kg) 193 lb 1.6 oz (87.6 kg) 189 lb (85.7 kg)    Telemetry    Sinus rhythm, multiple PVCs  - Personally Reviewed  ECG    NSR, ST depression in V2-V5, prolonged QTc  - Personally Reviewed  Physical Exam   GEN: No acute distress.   Neck: No JVD Cardiac: RRR, no murmurs, rubs, or  gallops.  Respiratory: Bibasilar crackles  GI: Soft, nontender, non-distended  MS: No edema; No deformity. Neuro:  Nonfocal  Psych: Normal affect   Labs    Chemistry Recent Labs  Lab 01/26/18 2253 01/27/18 0208 01/28/18 0420 01/29/18 0250  NA 139 141 136 137  K 3.7 4.1 3.9 3.4*  CL 102 102 100* 97*  CO2 26 29 26 29   GLUCOSE 181* 137* 133* 140*  BUN 22* 18 20 21*  CREATININE 1.11 1.13 1.23 1.12  CALCIUM 9.0 8.9 8.6* 8.4*  PROT 6.8  --   --   --   ALBUMIN 3.4*  --   --   --   AST 22  --   --   --   ALT 17  --   --   --   ALKPHOS 74  --   --   --   BILITOT 0.4  --   --   --   GFRNONAA >60 >60 56* >60  GFRAA >60 >60 >60 >60  ANIONGAP 11 10 10 11      Hematology Recent Labs  Lab  01/27/18 0208 01/27/18 1611 01/29/18 0250  WBC 11.6* 9.3 10.1  RBC 3.49* 3.12* 3.06*  HGB 10.8* 9.6* 9.5*  HCT 34.2* 30.2* 29.4*  MCV 98.0 96.8 96.1  MCH 30.9 30.8 31.0  MCHC 31.6 31.8 32.3  RDW 14.3 14.6 13.9  PLT 268 233 202    Cardiac Enzymes Recent Labs  Lab 01/27/18 0156 01/27/18 1137 01/28/18 1433  TROPONINI 3.04* 5.14* 8.34*    Recent Labs  Lab 01/26/18 2257  TROPIPOC 0.34*     BNP Recent Labs  Lab 01/26/18 2253  BNP 303.2*     DDimer No results for input(s): DDIMER in the last 168 hours.   Radiology    Dg Chest Port 1 View  Result Date: 01/29/2018 CLINICAL DATA:  Cough. EXAM: PORTABLE CHEST 1 VIEW COMPARISON:  01/27/2018 FINDINGS: 1223 hours. The cardio pericardial silhouette is enlarged. Minimal atelectasis noted left lung base. Calcified pleural plaques again evident. The visualized bony structures of the thorax are intact. Telemetry leads overlie the chest. Loop recorder overlies the left heart. IMPRESSION: Stable. Cardiomegaly with mild vascular congestion and bilateral calcified pleural plaques. Left base atelectasis. Electronically Signed   By: Misty Stanley M.D.   On: 01/29/2018 13:22    Cardiac Studies   Echocardiogram 01/27/2018: Study Conclusions -  Left ventricle: The cavity size was mildly dilated. Systolic   function was severely reduced. The estimated ejection fraction   was in the range of 25% to 30%. Diffuse hypokinesis with worse in   the basal anteroseptum, basal to mid anterolateral, and   inferolateral myocardium. Doppler parameters are consistent with   a reversible restrictive pattern, indicative of decreased left   ventricular diastolic compliance and/or increased left atrial   pressure (grade 3 diastolic dysfunction). Doppler parameters are   consistent with high ventricular filling pressure. - Aortic valve: Valve mobility was restricted. Transvalvular   velocity was within the normal range. There was no stenosis.   There was mild regurgitation. - Mitral valve: Transvalvular velocity was within the normal range.   There was no evidence for stenosis. There was mild to moderate   regurgitation. Valve area by continuity equation (using LVOT   flow): 0.47 cm^2. - Left atrium: The atrium was moderately dilated. - Right ventricle: The cavity size was normal. Wall thickness was   normal. Systolic function was normal. - Tricuspid valve: There was mild regurgitation. - Pulmonary arteries: Systolic pressure was moderately increased.   PA peak pressure: 50 mm Hg (S).     Patient Profile     72 y.o. male with pmhx of CAD s/p four-vessel CABG 12 years ago, HFrEF (35-40% on most recent echo), OSA, CVA, dementia, and alcohol abuse who presented with sudden onset chest pain. Troponin elevated to 3.04, EKG without acute ischemic changes.   Assessment & Plan    NSTEMI: S/p LHC yesterday with severe multivessel (total occulusion of LAD, critical stenosis of distal left main/ostial circumflex, and severe stenosis of RCA) and multi graft disease (patenet LIMA-LAD but total occlusion of SVG-OM, SVG-PDA, and SVG-diagonal).  -- S/p PCI to the left main / ostial circumflex with DES  -- Medical management for remaining CAD -- PCI to RCA  was deferred due to the vessel being tortuous and carrying significant risk of dissection or complication. If patient fails medical therapy, can consider PCI of RCA.  -- Continue Plavix, ASA, and Eliquis (day 3/3), stop ASA tomorrow  -- Continue Lipitor 80 mg -- Continue spironolactone 25 mg daily  -- IV lasix  40 mg BID yesterday; resumed home po lasix 60 mg daily  -- Holding losartan & carvedilol  -- Telemetry  -- PT/OT -- Discharge pending GI evaluation   Esophageal stricture / Esophageal dysmotility: Had barium swallow last hospitalization with impacted food and possible esophageal stricture. He has previously required multiple esophogeal dilations. He was evaluated by GI last hospitalization with plans for further outpatient work up. SLP evaluation yesterday with persisent concern for esophageal dysphagia. Given his persistent post prandial chest pain despite revascularization, I suspect many of his symptoms are related to his chronic esophageal dysfunction.  -- GI consult   For questions or updates, please contact [place] Please consult [email or web address] for contact info under Cardiology/STEMI.      Signed, Velna Ochs, MD  01/30/2018, 9:27 AM

## 2018-01-30 NOTE — Care Management Important Message (Signed)
Important Message  Patient Details  Name: [name] MRN: [id number] Date of Birth: 1946/02/09   Medicare Important Message Given:  Yes    [name] 01/30/2018, 3:30 PM

## 2018-01-30 NOTE — Progress Notes (Signed)
Update provided to wife over the phone per pt request

## 2018-01-31 DIAGNOSIS — T17908S Unspecified foreign body in respiratory tract, part unspecified causing other injury, sequela: Secondary | ICD-10-CM

## 2018-01-31 LAB — GLUCOSE, CAPILLARY
GLUCOSE-CAPILLARY: 136 mg/dL — AB (ref 65–99)
GLUCOSE-CAPILLARY: 123 mg/dL — AB (ref 65–99)
Glucose-Capillary: 113 mg/dL — ABNORMAL HIGH (ref 65–99)

## 2018-01-31 MED ORDER — CLOPIDOGREL BISULFATE 75 MG PO TABS: 75.0000 mg | ORAL_TABLET | Freq: Every day | ORAL | 11 refills | Status: AC

## 2018-01-31 MED ORDER — PANTOPRAZOLE SODIUM 40 MG PO TBEC: 40.0000 mg | DELAYED_RELEASE_TABLET | Freq: Two times a day (BID) | ORAL | 0 refills | Status: AC

## 2018-01-31 MED ORDER — ATORVASTATIN CALCIUM 80 MG PO TABS: 80.0000 mg | ORAL_TABLET | Freq: Every day | ORAL | 5 refills | Status: AC

## 2018-01-31 MED ORDER — NITROGLYCERIN 0.4 MG SL SUBL: 0.4000 mg | SUBLINGUAL_TABLET | SUBLINGUAL | 2 refills | Status: AC | PRN

## 2018-01-31 MED ORDER — FUROSEMIDE 20 MG PO TABS: 60.0000 mg | ORAL_TABLET | Freq: Every day | ORAL | 5 refills | Status: AC

## 2018-01-31 MED ORDER — ASPIRIN 81 MG PO TBEC: 81.0000 mg | DELAYED_RELEASE_TABLET | Freq: Every day | ORAL | 0 refills | Status: AC

## 2018-01-31 NOTE — Plan of Care (Signed)
  Problem: Clinical Measurements: Goal: Diagnostic test results will improve Outcome: Progressing   Problem: Clinical Measurements: Goal: Respiratory complications will improve Outcome: Progressing   Problem: Clinical Measurements: Goal: Cardiovascular complication will be avoided Outcome: Progressing

## 2018-01-31 NOTE — Progress Notes (Signed)
Report given to Santiago Glad at [place]

## 2018-01-31 NOTE — Clinical Social Work Placement (Signed)
   CLINICAL SOCIAL WORK PLACEMENT  NOTE Otilio Saber [phone] RN to call report to [phone]  Date:  01/31/2018  Patient Details  Name: [name] MRN: [id number] Date of Birth: 06-25-1946  Clinical Social Work is seeking post-discharge placement for this patient at the [place] level of care (*CSW will initial, date and re-position this form in  chart as items are completed):      Patient/family provided with [place] Work Department's list of facilities offering this level of care within the geographic area requested by the patient (or if unable, by the patient's family).  Yes   Patient/family informed of their freedom to choose among providers that offer the needed level of care, that participate in Medicare, Medicaid or managed care program needed by the patient, have an available bed and are willing to accept the patient.      Patient/family informed of [facility]'s ownership interest in [facility] and [facility], as well as of the fact that they are under no obligation to receive care at these facilities.  PASRR submitted to EDS on       PASRR number received on       Existing PASRR number confirmed on 01/31/18     FL2 transmitted to all facilities in geographic area requested by pt/family on 01/31/18     FL2 transmitted to all facilities within larger geographic area on       Patient informed that his/her managed care company has contracts with or will negotiate with certain facilities, including the following:        Yes   Patient/family informed of bed offers received.  Patient chooses bed at [facility] recommends and patient chooses bed at      Patient to be transferred to [facility] [place] on 01/31/18.  Patient to be transferred to facility by PT[facility]     Patient family notified on 01/31/18 of transfer.  Name of family member notified:  wife,Patricia      PHYSICIAN       Additional  Comment:    _______________________________________________ Alexander Mt, [place] 01/31/2018, 12:46 PM

## 2018-01-31 NOTE — Progress Notes (Signed)
Pt IV discontinued, catheter intact and telemetry removed. Report given to transport along with DNR and two AVS per wife request. Pt discharged via stretcher with [facility] on 4L nasal canula.

## 2018-01-31 NOTE — Social Work (Addendum)
CSW spoke with pt wife, pt has been living at [facility] and she is amenable to pt returning. Pt wife requested an AVS copy since she states that often the SNF gets a copy and she would like to be in the loop as well. CSW requested RN to make a copy to go to facility for pt wife. Pt wife also requested RN call when pt pick up is here, RN amenable to this.   Pt wife states no further questions or concerns. CSW spoke with Vickii Chafe, transitions coordinator at [facility], they are ready for pt to come back and have informed [facility] that [facility] will be [facility] since [place] also follows pt.   Clinical Social Worker facilitated patient discharge including contacting patient family and facility to confirm patient discharge plans.  Clinical information faxed to facility and family agreeable with plan.  CSW arranged ambulance transport via [facility] to [facility]. RN to call [phone] with report  prior to discharge.  Clinical Social Worker will sign off for now as social work intervention is no longer needed. Please consult [place] again if new need arises.  Alexander Mt, [place] Social Worker [phone] or [phone] (weekend phone)

## 2018-01-31 NOTE — Progress Notes (Signed)
1100 Left MI booklet and heart healthy diet for wife to read as she is not here right now and pt asleep. Will refer to CRP 2 [place] but not appropriate for pt at this time due to limitation in mobility. Ex ed also not appropriate. Will let PT address activity at this time.Graylon Good RN BSN 01/31/2018 12:12 PM

## 2018-01-31 NOTE — Progress Notes (Signed)
Subjective: Complains of cough and trouble catching breath. Reports swallowing ok.  Objective: Vital signs in last 24 hours: Temp:  [97.8 F (36.6 C)-98.6 F (37 C)] 98 F (36.7 C) (06/08 0535) Pulse Rate:  [71-90] 71 (06/08 0535) Resp:  [10-16] 10 (06/08 0535) BP: (89-112)/(50-72) 100/63 (06/08 0535) SpO2:  [94 %-100 %] 96 % (06/08 0535) Weight:  [86.4 kg (190 lb 6.4 oz)] 86.4 kg (190 lb 6.4 oz) (06/08 0535) Weight change: -1.225 kg (-2 lb 11.2 oz) Last BM Date: 01/30/18  PE: GEN:  Chronically confused, NAD  Lab Results: CBC    Component Value Date/Time   WBC 10.1 01/29/2018 0250   RBC 3.06 (L) 01/29/2018 0250   HGB 9.5 (L) 01/29/2018 0250   HGB 11.9 (L) 11/27/2017 1459   HCT 29.4 (L) 01/29/2018 0250   HCT 36.0 (L) 11/27/2017 1459   PLT 202 01/29/2018 0250   PLT 313 11/27/2017 1459   MCV 96.1 01/29/2018 0250   MCV 89 11/27/2017 1459   MCH 31.0 01/29/2018 0250   MCHC 32.3 01/29/2018 0250   RDW 13.9 01/29/2018 0250   RDW 17.1 (H) 11/27/2017 1459   LYMPHSABS 1.6 10/08/2017 2301   MONOABS 1.4 (H) 10/08/2017 2301   EOSABS 0.2 10/08/2017 2301   BASOSABS 0.0 10/08/2017 2301   CMP     Component Value Date/Time   NA 137 01/29/2018 0250   NA 141 11/27/2017 1459   K 3.4 (L) 01/29/2018 0250   CL 97 (L) 01/29/2018 0250   CO2 29 01/29/2018 0250   GLUCOSE 140 (H) 01/29/2018 0250   BUN 21 (H) 01/29/2018 0250   BUN 18 11/27/2017 1459   CREATININE 1.12 01/29/2018 0250   CREATININE 0.96 01/04/2016 1058   CALCIUM 8.4 (L) 01/29/2018 0250   PROT 6.8 01/26/2018 2253   ALBUMIN 3.4 (L) 01/26/2018 2253   AST 22 01/26/2018 2253   ALT 17 01/26/2018 2253   ALKPHOS 74 01/26/2018 2253   BILITOT 0.4 01/26/2018 2253   GFRNONAA >60 01/29/2018 0250   GFRNONAA 79 01/04/2016 1058   GFRAA >60 01/29/2018 0250   GFRAA >89 01/04/2016 1058   Assessment:  1.  Dysphagia.  Chronic problem for many years.  Esophagram February 2019 showed esophageal dysmotility without  mass/stricture/blockage.   Plan:  1.  Speech therapy to guide diet. 2.  No role for endoscopy at the present time; moreover, endoscopy relatively contraindicated at this time until his respiratory status improves. 3.  [facility] will sign-off; primary team can arrange follow-up with Dr. [name] [facility] [phone]) as felt necessary; please call with questions; thank you for the consultation.   Landry Dyke 01/31/2018, 9:49 AM   Cell [phone] If no answer or after 5 PM call [phone]

## 2018-01-31 NOTE — NC FL2 (Signed)
[place] MEDICAID FL2 LEVEL OF CARE SCREENING TOOL     IDENTIFICATION  Patient Name: [name] Birthdate: Feb 10, 1946 Sex: male Admission Date (Current Location): 01/26/2018  [facility] and [place] Number:  Herbalist and Address:  [facility], [place] [address] [place], [place] [address]      Provider Number: [id number]  Attending Physician Name and Address:  Sanda Klein, MD  Relative Name and Phone Number:       Current Level of Care: Hospital Recommended Level of Care: [place] Prior Approval Number:    Date Approved/Denied:   PASRR Number: [id number]  Discharge Plan: SNF (LTC)    Current Diagnoses: Patient Active Problem List   Diagnosis Date Noted  . Aspiration into airway   . Emesis   . Hypoxia   . ACS (acute coronary syndrome) ([place]) 01/27/2018  . Acute on chronic combined systolic and diastolic CHF (congestive heart failure) ([place])   . Dementia 09/30/2017  . Chronic diarrhea   . Severe comorbid illness   . Pressure injury of skin 08/28/2017  . NSTEMI (non-ST elevated myocardial infarction) ([place]) 08/27/2017  . Acute CHF (congestive heart failure) ([place]) 08/27/2017  . Rectal bleeding 07/07/2017  . Anemia   . Generalized weakness 04/25/2017  . Cerebrovascular accident (CVA) due to embolism of right anterior cerebral artery ([place]) 09/19/2015  . History of CVA with residual deficit 09/19/2015  . HLD (hyperlipidemia) 07/12/2015  . Essential hypertension 07/12/2015  . Coronary artery disease involving coronary bypass graft of native heart with angina pectoris ([place]) 07/12/2015  . Intracranial carotid stenosis 07/12/2015  . Morbid obesity ([place]) 02/21/2015  . Dysarthria   . History of pulmonary embolism   . Acute right ACA stroke ([place]) 02/11/2015  . Lower extremity weakness 01/08/2015  . Status post placement of implantable loop recorder 08/01/2014  . Chronic respiratory failure, unspecified whether with hypoxia  or hypercapnia ([place]) 05/19/2014  . Pulmonary embolism ([place]) 05/19/2014  . Hemiparesis and speech and language deficit as late effects of stroke ([place]) 04/19/2014  . Ureteral colic 65/46/5035  . Left ureteral stone 04/15/2014  . Obstructive sleep apnea 12/07/2013  . Obesity, unspecified 12/07/2013  . Embolic stroke ([place]) 46/56/8127  . Cough 06/09/2013  . S/P lumbar spine operation 06/02/2013  . Nerve root pain 06/02/2013  . Pleural plaque consistent with asbestos exposure by CT 10/2012 02/17/2013  . Renal failure 02/13/2013  . Dyspnea 02/11/2013  . Esophageal dysphagia 05/12/2012  . Seizure disorder ([place]) 04/04/2012  . CAD (coronary artery disease) 06/13/2011  . Diabetes mellitus ([place]) 06/13/2011  . Depression   . Back pain   . Hearing loss   . Arrhythmia   . Diverticulitis   . H/O alcohol abuse   . Personal history of other diseases of circulatory system 05/29/2011    Orientation RESPIRATION BLADDER Height & Weight     Time, Self, Situation, Place  O2(3L nasal canula) Incontinent, External catheter Weight: 190 lb 6.4 oz (86.4 kg)(sca;e b) Height:  5' 6"  (167.6 cm)  BEHAVIORAL SYMPTOMS/MOOD NEUROLOGICAL BOWEL NUTRITION STATUS      Continent Diet(see discharge summary)  AMBULATORY STATUS COMMUNICATION OF NEEDS Skin   Extensive Assist Verbally PU Stage and Appropriate Care   PU Stage 2 Dressing: (gauze dressing on left ear)                   Personal Care Assistance Level of Assistance  Bathing, Feeding, Dressing Bathing Assistance: Maximum assistance Feeding assistance: Limited assistance  Dressing Assistance: Maximum assistance     Functional Limitations Info  Sight, Hearing, Speech Sight Info: Adequate Hearing Info: Adequate Speech Info: Adequate    SPECIAL CARE FACTORS FREQUENCY  PT (By licensed PT), OT (By licensed OT)     PT Frequency: 5x week OT Frequency: 5x week            Contractures Contractures Info: Not present    Additional Factors Info   Code Status, Allergies, Psychotropic, Insulin Sliding Scale Code Status Info: DNR Allergies Info: CEPHALEXIN, LAC BOVIS, OTHER, PENTAZOCINE, TIZANIDINE, CEPHALEXIN, DOXYCYCLINE, PENTAZOCINE LACTATE, TAPE, ZANAFLEX TIZANIDINE HCL  Psychotropic Info: donepezil (ARICEPT) tablet 10 mg PO daily; FLUoxetine (PROZAC) capsule 40 mg PO daily; traZODone (DESYREL) tablet 100 mg daily at bedtime Insulin Sliding Scale Info: insulin aspart (novoLOG) injection 0-15 Units 3x daily with meals       Current Medications (01/31/2018):  This is the current hospital active medication list Current Facility-Administered Medications  Medication Dose Route Frequency Provider Last Rate Last Dose  . 0.9 %  sodium chloride infusion  250 mL Intravenous PRN Sherren Mocha, MD      . acetaminophen (TYLENOL) tablet 650 mg  650 mg Oral Q4H PRN Sherren Mocha, MD   650 mg at 01/29/18 0915  . acetaminophen-codeine (TYLENOL #3) 300-30 MG per tablet 1 tablet  1 tablet Oral Q6H PRN Velna Ochs, MD      . apixaban Arne Cleveland) tablet 5 mg  5 mg Oral BID Sherren Mocha, MD   5 mg at 01/31/18 0932  . aspirin EC tablet 81 mg  81 mg Oral Daily Sherren Mocha, MD   81 mg at 01/31/18 0932  . atorvastatin (LIPITOR) tablet 80 mg  80 mg Oral q1800 Sherren Mocha, MD   80 mg at 01/30/18 1649  . clopidogrel (PLAVIX) tablet 75 mg  75 mg Oral Q breakfast Sherren Mocha, MD   75 mg at 01/31/18 0932  . donepezil (ARICEPT) tablet 10 mg  10 mg Oral Daily Sherren Mocha, MD   10 mg at 01/31/18 0932  . FLUoxetine (PROZAC) capsule 40 mg  40 mg Oral Daily Sherren Mocha, MD   40 mg at 01/31/18 0932  . furosemide (LASIX) tablet 60 mg  60 mg Oral Daily Velna Ochs, MD   60 mg at 01/31/18 0932  . gabapentin (NEURONTIN) capsule 100 mg  100 mg Oral Pollie Meyer, MD   100 mg at 01/30/18 2127  . gabapentin (NEURONTIN) capsule 100-300 mg  100-300 mg Oral QHS PRN Sherren Mocha, MD      . insulin aspart (novoLOG) injection 0-15 Units   0-15 Units Subcutaneous TID [facility] Sherren Mocha, MD   2 Units at 01/31/18 0932  . ipratropium-albuterol (DUONEB) 0.5-2.5 (3) MG/3ML nebulizer solution 3 mL  3 mL Nebulization Q2H PRN Sherren Mocha, MD      . lamoTRIgine (LAMICTAL) tablet 100 mg  100 mg Oral BID Sherren Mocha, MD   100 mg at 01/31/18 0932  . MEDLINE mouth rinse  15 mL Mouth Rinse BID Sherren Mocha, MD   15 mL at 01/27/18 2148  . Melatonin TABS 3 mg  3 mg Oral QHS Sherren Mocha, MD   3 mg at 01/30/18 2127  . morphine 2 MG/ML injection 1 mg  1 mg Intravenous Q2H PRN Sherren Mocha, MD   1 mg at 01/28/18 0444  . nitroGLYCERIN (NITROSTAT) SL tablet 0.4 mg  0.4 mg Sublingual Q5 Min x 3 PRN Sherren Mocha, MD   0.4 mg at 01/29/18  1341  . ondansetron (ZOFRAN) injection 4 mg  4 mg Intravenous Q6H PRN Sherren Mocha, MD   4 mg at 01/27/18 1236  . pantoprazole (PROTONIX) EC tablet 40 mg  40 mg Oral BID Wilford Corner, MD   40 mg at 01/31/18 0932  . polycarbophil (FIBERCON) tablet 625 mg  625 mg Oral BID Sherren Mocha, MD   625 mg at 01/31/18 0932  . promethazine (PHENERGAN) injection 12.5 mg  12.5 mg Intravenous Q6H PRN Sherren Mocha, MD      . sodium chloride flush (NS) 0.9 % injection 3 mL  3 mL Intravenous Q12H Sherren Mocha, MD   3 mL at 01/30/18 0927  . sodium chloride flush (NS) 0.9 % injection 3 mL  3 mL Intravenous PRN Sherren Mocha, MD      . spironolactone (ALDACTONE) tablet 25 mg  25 mg Oral Daily Sherren Mocha, MD   25 mg at 01/31/18 0932  . traZODone (DESYREL) tablet 100 mg  100 mg Oral Pollie Meyer, MD   100 mg at 01/30/18 2127     Discharge Medications: Please see discharge summary for a list of discharge medications.  Relevant Imaging Results:  Relevant Lab Results:   Additional Information SS#: [place] [address] [place], [place]

## 2018-01-31 NOTE — Progress Notes (Signed)
Patient placed on CPAP and tolerating well. #l O2 bleed in and patient sleeping with sats 95% and greater Hr 84

## 2018-01-31 NOTE — Discharge Summary (Signed)
Discharge Summary    Patient ID: [name],  MRN: [id number], DOB/AGE: Jan 06, 1946 72 y.o.  Admit date: 01/26/2018 Discharge date: 01/31/2018  Primary Care Provider: Bernell List Primary Cardiologist: Peter Martinique, MD  Discharge Diagnoses    Active Problems:   Esophageal dysphagia   ACS (acute coronary syndrome) (HCC)   Acute on chronic combined systolic and diastolic CHF (congestive heart failure) (HCC)   Emesis   Hypoxia   Aspiration into airway   Allergies Allergies  Allergen Reactions  . Cephalexin     Other reaction(s): Unknown  . Lac Bovis Other (See Comments)    lactose intolerant Other reaction(s): Other lactose intolerant  . Other     Other reaction(s): Other Paper tape only please.  Marland Kitchen Pentazocine Other (See Comments)    hallucinations Other reaction(s): Other hallucinations  . Tizanidine     Other reaction(s): Other Lightheaded and dizzy  . Cephalexin     Unknown  . Doxycycline     Unknown  . Pentazocine Lactate     He passed out- he had a seizure.  This occurred around 2000  . Tape Other (See Comments)    Paper tape only please.  . Zanaflex [Tizanidine Hcl] Other (See Comments)    Lightheaded and dizzy    Diagnostic Studies/Procedures    Cardiac Cath 01/31/18 Conclusion   1. Severe native vessel CAD with total occlusion of the LAD, critical stenosis of the distal left main/ostial circumflex, and severe stenosis of the RCA 2. S/P CABG with continued patency of the LIMA-LAD and total occlusion of the SVG-OM, SVG-PDA, and SVG-diagonal 3. Successful PCI of the left main/ostial circumflex with a 3.5x16 mm Synergy DES  Recommend:  Medical therapy for residual CAD unless recurrent ACS, then consider PCI of the RCA ? The RCA is tortuous and at significant risk of complication with PCI  Continue cangrelor x 2 hours, then load with plavix 600 mg (orders written)  Plavix, ASA, apixaban x 3 days, then stop ASA  Ok to resume apixaban in AM  (orders written)  Fluids written at [facility] because of high LVEDP. Consider diuresis tomorrow if creatinine stable     History of Present Illness     72 y.o. male with pmhx of CAD s/p four-vessel CABG 12 years ago, HFrEF, OSA, CVA, dementia, and alcohol abuse who presented on 01/26/18 with sudden onset chest pain. Troponin elevated to 3.04, EKG without acute ischemic changes.   Of note, he was admitted earlier in the year, 08/2016, with heart failure and mildly elevated troponin thought to be due to demand ischemia.  He then had a NSTEMI 09/2017 that was treated medically and thought to be due to sepsis and deconditioning.  Echo that admission revealed LVEF 20 to 25% with moderate MR/TR.  He was not evaluated by cardiac catheterization at that time due to deconditioning and acute illness.  On repeat 11/2017 his LVEF improved to 35 to 40%.  Since that time, his functional status had improved. However, he presented this admission, on 01/26/18, with progressive exertional dyspnea and chest pain. Initial troponin was 3. He was admitted for NSTEMI.   Hospital Course     Pt was admitted to telemetry. Troponin increased from 3.04>>8.34. He was started on IV heparin. Cath however was delayed due to hypoxia and orthopnea.  He received IV Lasix and volume status improved. Echo was performed and showed slight worsening in his LVEF to 25 to 30% with global hypokinesis. He was taken to  the cath lab on 01/28/18 and found to have severe multivessel (total occulusion of LAD, critical stenosis of distal left main/ostial circumflex, and severe stenosis of RCA) and multi graft disease (patenet LIMA-LAD but total occlusion of SVG-OM, SVG-PDA, and SVG-diagonal). He underwent PCI to the LM/ ostial circumflex with DES. Medical management was elected for remaining CAD. PCI to RCA was deferred due to the vessel being tortuous and carrying significant risk of dissection or complication. If patient fails medical therapy, can consider PCI of  RCA. He was placed on DAPT w/ ASA and Plavix and Eliquis was continued. Plan is for triple therapy x 30 days, then stop ASA thereafter. He was also placed on high dose statin, Lipitor 80 mg. His home Coreg and Losartan were held due to soft BP. LVEDP was also noted to be high during cath and additional diuresis was recommended. He was treated with additional doses of IV Lasix and had good response. He was transitioned back to PO. He had no post cath complications. CP resolved. No further dyspnea. Vital signs and renal function remained stable as well as cath access site. Pt was last seen and examined by Dr. Meda Coffee, who determined he was stable for discharge home. Post hospital f/u will be arranged with APP in 1-2 weeks. He has f/u with Dr. Martinique 03/04/18. His Coreg and Losartan were held on discharge due to soft BP. May consider restarting at hospital f/u if BP allows. He will need a f/u BMP at hospital f/u. Instructions written to stop ASA on 02/27/18.  Also of note, GI was consulted during admission due to esophageal stricture / esophageal dysmotility. He had a barium swallow last hospitalization with impacted food and possible esophageal stricture. He has previously required multiple esophogeal dilations. He was evaluated by GI last hospitalization with plans for further outpatient work up. GI plans to f/u with him as an outpatient.   Consultants: GI  Discharge Vitals Blood pressure 119/69, pulse 81, temperature 97.6 F (36.4 C), temperature source Oral, resp. rate 18, height 5' 6"  (1.676 m), weight 190 lb 6.4 oz (86.4 kg), SpO2 97 %.  Filed Weights   01/29/18 1800 01/30/18 0311 01/31/18 0535  Weight: 193 lb 1.6 oz (87.6 kg) 189 lb (85.7 kg) 190 lb 6.4 oz (86.4 kg)    Labs & Radiologic Studies    CBC Recent Labs    01/29/18 0250  WBC 10.1  HGB 9.5*  HCT 29.4*  MCV 96.1  PLT 720   Basic Metabolic Panel Recent Labs    01/29/18 0250  NA 137  K 3.4*  CL 97*  CO2 29  GLUCOSE 140*  BUN  21*  CREATININE 1.12  CALCIUM 8.4*   Liver Function Tests No results for input(s): AST, ALT, ALKPHOS, BILITOT, PROT, ALBUMIN in the last 72 hours. No results for input(s): LIPASE, AMYLASE in the last 72 hours. Cardiac Enzymes Recent Labs    01/28/18 1433  TROPONINI 8.34*   BNP Invalid input(s): POCBNP D-Dimer No results for input(s): DDIMER in the last 72 hours. Hemoglobin A1C No results for input(s): HGBA1C in the last 72 hours. Fasting Lipid Panel No results for input(s): CHOL, HDL, LDLCALC, TRIG, CHOLHDL, LDLDIRECT in the last 72 hours. Thyroid Function Tests No results for input(s): TSH, T4TOTAL, T3FREE, THYROIDAB in the last 72 hours.  Invalid input(s): FREET3 _____________  Bea Graff 1 View  Result Date: 01/27/2018 CLINICAL DATA:  Emesis and diarrhea today EXAM: ABDOMEN - 1 VIEW COMPARISON:  10/04/2017 FINDINGS: Normal bowel gas  pattern. No bowel dilatation or bowel wall thickening. Prior lumbosacral fusion. Surgical clips LEFT upper quadrant. Penile prosthesis. Loop recorder projects over lower chest with postsurgical changes of CABG. Bones demineralized. IMPRESSION: Nonobstructive bowel gas pattern. Electronically Signed   By: Lavonia Dana M.D.   On: 01/27/2018 16:50   Dg Chest Port 1 View  Result Date: 01/29/2018 CLINICAL DATA:  Cough. EXAM: PORTABLE CHEST 1 VIEW COMPARISON:  01/27/2018 FINDINGS: 1223 hours. The cardio pericardial silhouette is enlarged. Minimal atelectasis noted left lung base. Calcified pleural plaques again evident. The visualized bony structures of the thorax are intact. Telemetry leads overlie the chest. Loop recorder overlies the left heart. IMPRESSION: Stable. Cardiomegaly with mild vascular congestion and bilateral calcified pleural plaques. Left base atelectasis. Electronically Signed   By: Misty Stanley M.D.   On: 01/29/2018 13:22   Dg Chest Port 1 View  Result Date: 01/27/2018 CLINICAL DATA:  Hypoxia, history hypertension, diabetes mellitus, coronary  artery disease, stroke, pulmonary embolism EXAM: PORTABLE CHEST 1 VIEW COMPARISON:  Portable exam 1627 hours compared to 01/26/2018 FINDINGS: Loop recorder projects over lower LEFT chest. Enlargement of cardiac silhouette post CABG. Mediastinal contours and pulmonary vascularity normal. Atherosclerotic calcification aorta. Lungs clear. No pleural effusion or pneumothorax. Calcified pleural plaques bilaterally. Bones demineralized. IMPRESSION: Enlargement of cardiac silhouette post CABG. Stable calcified pleural plaque disease question asbestos exposure without acute abnormalities. Electronically Signed   By: Lavonia Dana M.D.   On: 01/27/2018 16:49   Dg Chest Portable 1 View  Result Date: 01/26/2018 CLINICAL DATA:  Acute onset of generalized chest pain, shortness of breath and nausea. EXAM: PORTABLE CHEST 1 VIEW COMPARISON:  Chest radiograph performed 10/09/2017 FINDINGS: The lungs are well-aerated. Vascular congestion is noted. Increased interstitial markings may reflect mild interstitial edema or possibly pneumonia. There is no evidence of pleural effusion or pneumothorax. The cardiomediastinal silhouette is normal in size. The patient is status post median sternotomy, with evidence of prior CABG. No acute osseous abnormalities are seen. IMPRESSION: Vascular congestion noted. Increased interstitial markings may reflect mild interstitial edema or possibly pneumonia. Electronically Signed   By: Garald Balding M.D.   On: 01/26/2018 23:46   Disposition   Pt is being discharged home today in good condition.  Follow-up Plans & Appointments     Contact information for follow-up providers    Laurence Spates, MD. Schedule an appointment as soon as possible for a visit in 4 week(s).   Specialty:  Gastroenterology Why:  Please call and schedule follow up with Dr. [name] in the next 4-6 weeks  Contact information: [address] [place] [address] [phone]        Martinique, Peter M, MD  Follow up.   Specialty:  Cardiology Why:  our office will call you with a hospital follow-up visit in 1-2 weeks Contact information: [place] [address] [phone]            Contact information for after-discharge care    Destination    [facility] .   Service:  Skilled Nursing Contact information: [address] [place] [phone]                 Discharge Instructions    Amb Referral to Cardiac Rehabilitation   Complete by:  As directed    Diagnosis:   NSTEMI Coronary Stents     Diet - low sodium heart healthy   Complete by:  As directed  Increase activity slowly   Complete by:  As directed       Discharge Medications   Allergies as of 01/31/2018      Reactions   Cephalexin    Other reaction(s): Unknown   Lac Bovis Other (See Comments)   lactose intolerant Other reaction(s): Other lactose intolerant   Other    Other reaction(s): Other Paper tape only please.   Pentazocine Other (See Comments)   hallucinations Other reaction(s): Other hallucinations   Tizanidine    Other reaction(s): Other Lightheaded and dizzy   Cephalexin    Unknown   Doxycycline    Unknown   Pentazocine Lactate    He passed out- he had a seizure.  This occurred around 2000   Tape Other (See Comments)   Paper tape only please.   Zanaflex [tizanidine Hcl] Other (See Comments)   Lightheaded and dizzy      Medication List    STOP taking these medications   carvedilol 3.125 MG tablet Commonly known as:  COREG   losartan 25 MG tablet Commonly known as:  COZAAR     TAKE these medications   acetaminophen 325 MG tablet Commonly known as:  TYLENOL Take 2 tablets (650 mg total) by mouth every 4 (four) hours as needed for mild pain, fever or headache.   apixaban 5 MG Tabs tablet Commonly known as:  ELIQUIS Take 1 tablet (5 mg total) by mouth 2 (two) times daily.   aspirin 81 MG EC  tablet Take 1 tablet (81 mg total) by mouth daily for 27 days.   atorvastatin 80 MG tablet Commonly known as:  LIPITOR Take 1 tablet (80 mg total) by mouth daily at 6 PM. What changed:    medication strength  how much to take   clopidogrel 75 MG tablet Commonly known as:  PLAVIX Take 1 tablet (75 mg total) by mouth daily with breakfast. Start taking on:  02/01/2018   donepezil 10 MG tablet Commonly known as:  ARICEPT Take 1 tablet (10 mg total) by mouth daily. What changed:  when to take this   Fluocinolone Acetonide 0.01 % Oil Apply 1 application topically daily as needed (itching). Apply to scalp   FLUoxetine 40 MG capsule Commonly known as:  PROZAC Take 1 capsule (40 mg total) by mouth daily.   fluticasone 50 MCG/ACT nasal spray Commonly known as:  FLONASE Place 1 spray into both nostrils 2 (two) times daily as needed for rhinitis.   furosemide 20 MG tablet Commonly known as:  LASIX Take 3 tablets (60 mg total) by mouth daily.   gabapentin 100 MG capsule Commonly known as:  NEURONTIN Take 100 mg by mouth at bedtime.   gabapentin 100 MG capsule Commonly known as:  NEURONTIN Take 100-300 mg by mouth at bedtime as needed (for itching).   hydroxypropyl methylcellulose / hypromellose 2.5 % ophthalmic solution Commonly known as:  ISOPTO TEARS / GONIOVISC Place 1 drop into both eyes 2 (two) times daily as needed for dry eyes.   hyoscyamine 0.125 MG SL tablet Commonly known as:  LEVSIN SL Place 0.125 mg under the tongue every 4 (four) hours as needed for cramping.   insulin aspart 100 UNIT/ML injection Commonly known as:  novoLOG Inject 0-15 Units into the skin 3 (three) times daily with meals.   ipratropium-albuterol 0.5-2.5 (3) MG/3ML Soln Commonly known as:  DUONEB Take 3 mLs by nebulization 2 (two) times daily.   lamoTRIgine 100 MG tablet Commonly known as:  LAMICTAL Take 1  tablet (100 mg total) by mouth 2 (two) times daily.   Melatonin 3 MG Tabs Take 3  mg by mouth at bedtime.   multivitamin with minerals Tabs tablet Take 1 tablet by mouth daily.   nitroGLYCERIN 0.4 MG SL tablet Commonly known as:  NITROSTAT Place 1 tablet (0.4 mg total) under the tongue every 5 (five) minutes as needed (as needed for chest pain or with symptoms of esophageal spasm).   OXYGEN Inhale 2.5 L into the lungs.   pantoprazole 40 MG tablet Commonly known as:  PROTONIX Take 1 tablet (40 mg total) by mouth 2 (two) times daily.   potassium chloride 10 MEQ tablet Commonly known as:  K-DUR Take 10 mEq by mouth daily.   psyllium 0.52 g capsule Commonly known as:  REGULOID Take 0.52 g by mouth 2 (two) times daily.   ranitidine 150 MG tablet Commonly known as:  ZANTAC Take 150 mg by mouth daily.   spironolactone 25 MG tablet Commonly known as:  ALDACTONE Take 1 tablet (25 mg total) by mouth daily.   traZODone 100 MG tablet Commonly known as:  DESYREL Take 1 tablet (100 mg total) by mouth at bedtime.        Aspirin prescribed at discharge?  Yes High Intensity Statin Prescribed? (Lipitor 40-28m or Crestor 20-429m: Yes Beta Blocker Prescribed? No: soft BB For EF <40%, was ACEI/ARB Prescribed? No: soft BP ADP Receptor Inhibitor Prescribed? (i.e. Plavix etc.-Includes Medically Managed Patients): Yes For EF <40%, Aldosterone Inhibitor Prescribed? Yes Was EF assessed during THIS hospitalization? Yes Was Cardiac Rehab II ordered? (Included Medically managed Patients): Yes   Outstanding Labs/Studies   BMP at hospital f/u  Duration of Discharge Encounter   Greater than 30 minutes including physician time.  Signed,  BrLyda JesterPA-C 01/31/2018, 12:14 PM

## 2018-01-31 NOTE — Progress Notes (Signed)
Progress Note  Patient Name: [name] Date of Encounter: 01/31/2018  Primary Cardiologist: Peter Martinique, MD   Subjective   Complaining of back pain and ongoing chest pain, not worse than before. Denies dysphagia / odynophagia.   Inpatient Medications    Scheduled Meds: . apixaban  5 mg Oral BID  . aspirin EC  81 mg Oral Daily  . atorvastatin  80 mg Oral q1800  . clopidogrel  75 mg Oral Q breakfast  . donepezil  10 mg Oral Daily  . FLUoxetine  40 mg Oral Daily  . furosemide  60 mg Oral Daily  . gabapentin  100 mg Oral QHS  . insulin aspart  0-15 Units Subcutaneous TID WC  . lamoTRIgine  100 mg Oral BID  . mouth rinse  15 mL Mouth Rinse BID  . Melatonin  3 mg Oral QHS  . pantoprazole  40 mg Oral BID  . polycarbophil  625 mg Oral BID  . sodium chloride flush  3 mL Intravenous Q12H  . spironolactone  25 mg Oral Daily  . traZODone  100 mg Oral QHS   Continuous Infusions: . sodium chloride     PRN Meds: sodium chloride, acetaminophen, acetaminophen-codeine, gabapentin, ipratropium-albuterol, morphine injection, nitroGLYCERIN, ondansetron (ZOFRAN) IV, promethazine, sodium chloride flush   Vital Signs    Vitals:   01/30/18 1354 01/30/18 2011 01/31/18 0015 01/31/18 0535  BP: 106/68 112/72 (!) 89/50 100/63  Pulse: 90 82 80 71  Resp:  16 15 10   Temp:  98.6 F (37 C) 97.8 F (36.6 C) 98 F (36.7 C)  TempSrc:  Oral Oral Oral  SpO2: 98% 100% 94% 96%  Weight:    190 lb 6.4 oz (86.4 kg)  Height:        Intake/Output Summary (Last 24 hours) at 01/31/2018 1122 Last data filed at 01/31/2018 1032 Gross per 24 hour  Intake 600 ml  Output 1201 ml  Net -601 ml   Filed Weights   01/29/18 1800 01/30/18 0311 01/31/18 0535  Weight: 193 lb 1.6 oz (87.6 kg) 189 lb (85.7 kg) 190 lb 6.4 oz (86.4 kg)    Telemetry    Sinus rhythm, multiple PVCs  - Personally Reviewed  ECG    NSR, ST depression in V2-V5, prolonged QTc  - Personally Reviewed  Physical Exam   GEN: No acute  distress.   Neck: No JVD Cardiac: RRR, no murmurs, rubs, or gallops.  Respiratory: Bibasilar crackles  GI: Soft, nontender, non-distended  MS: No edema; No deformity. Neuro:  Nonfocal  Psych: Normal affect   Labs    Chemistry Recent Labs  Lab 01/26/18 2253 01/27/18 0208 01/28/18 0420 01/29/18 0250  NA 139 141 136 137  K 3.7 4.1 3.9 3.4*  CL 102 102 100* 97*  CO2 26 29 26 29   GLUCOSE 181* 137* 133* 140*  BUN 22* 18 20 21*  CREATININE 1.11 1.13 1.23 1.12  CALCIUM 9.0 8.9 8.6* 8.4*  PROT 6.8  --   --   --   ALBUMIN 3.4*  --   --   --   AST 22  --   --   --   ALT 17  --   --   --   ALKPHOS 74  --   --   --   BILITOT 0.4  --   --   --   GFRNONAA >60 >60 56* >60  GFRAA >60 >60 >60 >60  ANIONGAP 11 10 10  11  Hematology Recent Labs  Lab 01/27/18 0208 01/27/18 1611 01/29/18 0250  WBC 11.6* 9.3 10.1  RBC 3.49* 3.12* 3.06*  HGB 10.8* 9.6* 9.5*  HCT 34.2* 30.2* 29.4*  MCV 98.0 96.8 96.1  MCH 30.9 30.8 31.0  MCHC 31.6 31.8 32.3  RDW 14.3 14.6 13.9  PLT 268 233 202    Cardiac Enzymes Recent Labs  Lab 01/27/18 0156 01/27/18 1137 01/28/18 1433  TROPONINI 3.04* 5.14* 8.34*    Recent Labs  Lab 01/26/18 2257  TROPIPOC 0.34*     BNP Recent Labs  Lab 01/26/18 2253  BNP 303.2*     DDimer No results for input(s): DDIMER in the last 168 hours.   Radiology    Dg Chest Port 1 View  Result Date: 01/29/2018 CLINICAL DATA:  Cough. EXAM: PORTABLE CHEST 1 VIEW COMPARISON:  01/27/2018 FINDINGS: 1223 hours. The cardio pericardial silhouette is enlarged. Minimal atelectasis noted left lung base. Calcified pleural plaques again evident. The visualized bony structures of the thorax are intact. Telemetry leads overlie the chest. Loop recorder overlies the left heart. IMPRESSION: Stable. Cardiomegaly with mild vascular congestion and bilateral calcified pleural plaques. Left base atelectasis. Electronically Signed   By: Misty Stanley M.D.   On: 01/29/2018 13:22     Cardiac Studies   Echocardiogram 01/27/2018: Study Conclusions - Left ventricle: The cavity size was mildly dilated. Systolic   function was severely reduced. The estimated ejection fraction   was in the range of 25% to 30%. Diffuse hypokinesis with worse in   the basal anteroseptum, basal to mid anterolateral, and   inferolateral myocardium. Doppler parameters are consistent with   a reversible restrictive pattern, indicative of decreased left   ventricular diastolic compliance and/or increased left atrial   pressure (grade 3 diastolic dysfunction). Doppler parameters are   consistent with high ventricular filling pressure. - Aortic valve: Valve mobility was restricted. Transvalvular   velocity was within the normal range. There was no stenosis.   There was mild regurgitation. - Mitral valve: Transvalvular velocity was within the normal range.   There was no evidence for stenosis. There was mild to moderate   regurgitation. Valve area by continuity equation (using LVOT   flow): 0.47 cm^2. - Left atrium: The atrium was moderately dilated. - Right ventricle: The cavity size was normal. Wall thickness was   normal. Systolic function was normal. - Tricuspid valve: There was mild regurgitation. - Pulmonary arteries: Systolic pressure was moderately increased.   PA peak pressure: 50 mm Hg (S).     Patient Profile     72 y.o. male with pmhx of CAD s/p four-vessel CABG 12 years ago, HFrEF (35-40% on most recent echo), OSA, CVA, dementia, and alcohol abuse who presented with sudden onset chest pain. Troponin elevated to 3.04, EKG without acute ischemic changes.   Assessment & Plan    NSTEMI: S/p LHC yesterday with severe multivessel (total occulusion of LAD, critical stenosis of distal left main/ostial circumflex, and severe stenosis of RCA) and multi graft disease (patenet LIMA-LAD but total occlusion of SVG-OM, SVG-PDA, and SVG-diagonal).  -- S/p PCI to the left main / ostial  circumflex with DES  -- Medical management for remaining CAD -- PCI to RCA was deferred due to the vessel being tortuous and carrying significant risk of dissection or complication. If patient fails medical therapy, can consider PCI of RCA.  -- Continue Plavix, ASA, and Eliquis (day 3/3), stop ASA tomorrow  -- Continue Lipitor 80 mg -- Continue spironolactone 25 mg  daily  -- IV lasix 40 mg BID yesterday; resumed home po lasix 60 mg daily  -- Holding losartan & carvedilol  -- Telemetry  -- PT/OT  Esophageal stricture / Esophageal dysmotility: Had barium swallow last hospitalization with impacted food and possible esophageal stricture. He has previously required multiple esophogeal dilations. He was evaluated by GI last hospitalization with plans for further outpatient work up. SLP evaluation yesterday with persisent concern for esophageal dysphagia. Given his persistent post prandial chest pain despite revascularization, I suspect many of his symptoms are related to his chronic esophageal dysfunction.  -- GI consult recommended: .  Speech therapy to guide diet. 2.  No role for endoscopy at the present time; moreover, endoscopy relatively contraindicated at this time until his respiratory status improves. 3.  [facility] will sign-off; primary team can arrange follow-up with Dr. [name] [facility] [phone]) as felt necessary; please call with questions; thank you for the consultation.  We will discharge today. Same dose of lasix 60 mg po bid, start KCl 20 mEq daily.  For questions or updates, please contact [place] Please consult [email or web address] for contact info under Cardiology/STEMI.      Signed, Ena Dawley, MD  01/31/2018, 11:22 AM

## 2018-02-02 MED FILL — Heparin Sod (Porcine)-NaCl IV Soln 1000 Unit/500ML-0.9%: INTRAVENOUS | Qty: 1000 | Status: AC

## 2018-02-03 ENCOUNTER — Telehealth (HOSPITAL_COMMUNITY): Payer: Self-pay

## 2018-02-03 NOTE — Telephone Encounter (Signed)
Attempted to call patient to see if he is interested in the Cardiac Rehab Program - lm on vm

## 2018-02-03 NOTE — Telephone Encounter (Signed)
Patients insurance is active and benefits verified through Medicare A/B - No co-pay, deductible amount of $185.00/$185.00 has been met, no out of pocket, 20% co-insurance, and no pre-authorization is required. Passport/reference [phone]  Patients insurance is active and benefits verified through [place] - No co-pay, no deductible, no out of pocket, no co-insurance, and no pre-authorization is required. Passport/reference [phone]  Will contact patient to see if he is interested in the Cardiac Rehab Program. If interested, patient will need to complete follow up appt. Once completed, patient will be contacted for scheduling upon review by the RN Navigator.

## 2018-02-10 NOTE — ED Notes (Addendum)
Patient is stable and ready to be transport to the floor at this time.  Report was called to 4E RN.  Belongings taken with the patient to the floor.   

## 2018-02-23 ENCOUNTER — Encounter: Payer: Self-pay | Admitting: Cardiology

## 2018-03-03 DIAGNOSIS — I69359 Hemiplegia and hemiparesis following cerebral infarction affecting unspecified side: Secondary | ICD-10-CM | POA: Diagnosis not present

## 2018-03-03 DIAGNOSIS — I340 Nonrheumatic mitral (valve) insufficiency: Secondary | ICD-10-CM | POA: Diagnosis not present

## 2018-03-03 DIAGNOSIS — I2510 Atherosclerotic heart disease of native coronary artery without angina pectoris: Secondary | ICD-10-CM | POA: Diagnosis not present

## 2018-03-03 DIAGNOSIS — I509 Heart failure, unspecified: Secondary | ICD-10-CM | POA: Diagnosis not present

## 2018-03-03 DIAGNOSIS — Z515 Encounter for palliative care: Secondary | ICD-10-CM | POA: Diagnosis not present

## 2018-03-03 NOTE — Progress Notes (Signed)
Cardiology Office Note    Date:  03/04/2018   ID:  [name] 08/24/1946, MRN [id number]  PCP:  Carleene Cooper, MD  Cardiologist:  Dr. Swaziland Electrophysiologist: Dr. Graciela Husbands   Chief Complaint  Patient presents with  . Coronary Artery Disease  . Congestive Heart Failure    History of Present Illness:  [name] is a 72 y.o. male with PMH of HTN, HLD, PE in 05/2014 on eliquis, OSA on CPAP, CAD s/p CABG (LIMA to LAD, SVG to diagonal, SVG to left circumflex marginal branch, SVG to RCA on 12/21/2003), chronic diastolic HF, and h/o cryptogenic stroke with loop recorder, chronic home O2 for possible asbestos toxicity.  He had a PDA with a right internal carotid stenosis of 70% followed by neurology and a history of seizure.  Echocardiogram in September 2015 showed normal LV function.  He had a low risk Myoview in 2016.  Last echocardiogram obtained in January 2019 showed EF 50-55% grade 1 DD, mild MR.  He was admitted in January 2019 with heart failure, and was ruled in for NSTEMI which was felt to be demand ischemia, troponin peaked at 0.61.  He underwent diuresis and had AKI however renal function improved prior to discharge.  He was in rehab in [place] and had a C. difficile infection.   In February 2019 patient presented with severe weakness.  He was ruled in for NSTEMI was a troponin of 13.  He was treated with IV heparin.  Hospitalist managed the patient for sepsis, cardiology was consulted.  Given lack of chest discomfort, cardiology did not immediately proceed with invasive study.  Limited echo obtained on 10/01/2017 showed EF 20-25%, moderate MR, moderate TR, PA peak pressure 42 mmHg.  Further ischemic workup was held off until clinical improvement.   He was placed on carvedilol and Cozaar, Lasix increased to 40 mg twice daily and continued on spironolactone. Dr. Royann Shivers recommended on 10/10/2017 to repeat echocardiogram as outpatient to see if there is any improvement in the ejection  fraction and would only consider invasive coronary angiography if EF still shows substantial LV dysfunction.  He was seen in April he continued to have left-sided weakness.  Since January of this year, he has been essentially wheelchair-bound.  Repeat echocardiogram on 12/13/2017, his ejection fraction has improved to 35 to 40%, there was mild mitral regurgitation.  He was admitted in June 2019 with chest pain. Troponin increased from 3.04>>8.34. Ecg showed no acute change. He was started on IV heparin. Cath however was delayed due to hypoxia and orthopnea. He received IV Lasix and volume status improved. Echo was performed and showed slight worsening in his LVEF to 25 to 30% with global hypokinesis. He was taken to the cath lab on 01/28/18 and found to have severe multivessel (total occulusion of LAD, critical stenosis of distal left main/ostial circumflex, and severe stenosis of RCA) and multi graft disease (patent LIMA-LAD but total occlusion of SVG-OM, SVG-PDA, and SVG-diagonal). He underwent PCI to the LM/ ostial circumflex with DES. Medical management was elected for remaining CAD. PCI to RCA was deferred due to the vessel being tortuous and carrying significant risk of dissection or complication. If patient fails medical therapy, can consider PCI of RCA. He was placed on DAPT w/ ASA and Plavix and Eliquis was continued. Plan is for triple therapy x 30 days, then stop ASA thereafter. He was also placed on high dose statin, Lipitor 80 mg. His home Coreg and Losartan were held due  to soft BP. LVEDP was also noted to be high during cath and additional diuresis was recommended. He was treated with additional doses of IV Lasix and had good response. He was transitioned back to PO. He also was seen by GI for dysphagia and recurrent esophageal stricture. Plan follow up as outpatient.   On follow up today he is seen with family. He is at [facility]. He has completed PT and is able to walk now albeit now very far.  Memory is poor. Denies any chest pain but does note SOB. Weight is stable at 190 lbs- the same as DC weight. No increase in edema. Gets tired easily. ASA has been discontinued. Losartan resumed at lower dose. No palpitations. Apparently has follow up appt with GI.      Past Medical History:  Diagnosis Date  . AKI (acute kidney injury) (HCC)    a. 6/14: resolved after d/c ARB;  b. RA U/S 7/14: no RA stenosis  . Arthritis    of spine  . Atrial flutter (HCC)    a. 2008 - one episode in setting of sepsis/shock.  . Back pain    persistent  . Coronary artery disease    a. s/p CABG 2005. b. low risk nuc 2016.  Marland Kitchen Depression   . Diabetes mellitus    controled by diet  . Diverticulitis    a. s/p partial colectomy remotely.  Marland Kitchen GERD (gastroesophageal reflux disease)   . H/O alcohol abuse   . Headache(784.0)   . Hearing loss   . History of hiatal hernia   . Hx of echocardiogram    Echo 7/14:  Mod LVH, EF 55-60%, Gr 1 DD, mild MR, PASP 36  . Hyperlipidemia   . Hypertension   . Pulmonary embolism (HCC) 2005  . Renal stone 04/15/2014  . Seizures (HCC)   . Sleep apnea    does not use CPAP  . Stroke [facility])     Past Surgical History:  Procedure Laterality Date  . APPENDECTOMY    . BACK SURGERY    . CARDIAC CATHETERIZATION    . CERVICAL FUSION    . CORONARY ARTERY BYPASS GRAFT  2005   LIMA graft to LAD,saphenous vein graft to diag.,circumflex, marginal,and to the RCA  . CORONARY STENT INTERVENTION N/A 01/28/2018   Procedure: CORONARY STENT INTERVENTION;  Surgeon: Tonny Bollman, MD;  Location: [facility] INVASIVE CV LAB;  Service: Cardiovascular;  Laterality: N/A;  . CYSTOSCOPY/RETROGRADE/URETEROSCOPY/STONE EXTRACTION WITH BASKET Left 04/16/2014   Procedure: CYSTOSCOPY/RETROGRADE/LEFT URETEROSCOPY/STONE EXTRACTION WITH BASKET AND LASER LITHOTRIPSY;  Surgeon: Anner Crete, MD;  Location: [facility] ORS;  Service: Urology;  Laterality: Left;  With STENT  . ESOPHAGOGASTRODUODENOSCOPY  02/10/2012   Procedure:  ESOPHAGOGASTRODUODENOSCOPY (EGD);  Surgeon: Florencia Reasons, MD;  Location: [facility] ENDOSCOPY;  Service: Endoscopy;  Laterality: N/A;  . ESOPHAGOGASTRODUODENOSCOPY  06/29/2012   Procedure: ESOPHAGOGASTRODUODENOSCOPY (EGD);  Surgeon: Vertell Novak., MD;  Location: [facility] ENDOSCOPY;  Service: Endoscopy;  Laterality: N/A;  . FOREIGN BODY REMOVAL  02/10/2012   Procedure: FOREIGN BODY REMOVAL;  Surgeon: Florencia Reasons, MD;  Location: [facility] ENDOSCOPY;  Service: Endoscopy;  Laterality: N/A;  . FOREIGN BODY REMOVAL  06/29/2012   Procedure: FOREIGN BODY REMOVAL;  Surgeon: Vertell Novak., MD;  Location: [facility] ENDOSCOPY;  Service: Endoscopy;  Laterality: N/A;  . LEFT HEART CATH AND CORS/GRAFTS ANGIOGRAPHY N/A 01/28/2018   Procedure: LEFT HEART CATH AND CORS/GRAFTS ANGIOGRAPHY;  Surgeon: Tonny Bollman, MD;  Location: [facility] INVASIVE CV LAB;  Service: Cardiovascular;  Laterality: N/A;  . LEFT HEART CATHETERIZATION WITH CORONARY ANGIOGRAM N/A 04/02/2012   Procedure: LEFT HEART CATHETERIZATION WITH CORONARY ANGIOGRAM;  Surgeon: Lesleigh Noe, MD;  Location: [facility] CATH LAB;  Service: Cardiovascular;  Laterality: N/A;  . LOOP RECORDER IMPLANT N/A 11/17/2013   Procedure: LOOP RECORDER IMPLANT;  Surgeon: Duke Salvia, MD;  Location: [facility] CATH LAB;  Service: Cardiovascular;  Laterality: N/A;  . ORCHIECTOMY    . PARTIAL COLECTOMY     for diverticuli  . SAVORY DILATION  06/29/2012   Procedure: SAVORY DILATION;  Surgeon: Vertell Novak., MD;  Location: [facility] ENDOSCOPY;  Service: Endoscopy;  Laterality: N/A;  . TEE WITHOUT CARDIOVERSION N/A 11/17/2013   Procedure: TRANSESOPHAGEAL ECHOCARDIOGRAM (TEE);  Surgeon: Laurey Morale, MD;  Location: [facility] ENDOSCOPY;  Service: Cardiovascular;  Laterality: N/A;  . UMBILICAL HERNIA REPAIR      Current Medications: Outpatient Medications Prior to Visit  Medication Sig Dispense Refill  . acetaminophen (TYLENOL) 325 MG tablet Take 2 tablets (650 mg total) by mouth every 4 (four) hours as  needed for mild pain, fever or headache. (Patient taking differently: Take 2 tablets (650 mg total) by mouth every 4 (four) hours as needed for mild pain, fever or headache.)    . apixaban (ELIQUIS) 5 MG TABS tablet Take 1 tablet (5 mg total) by mouth 2 (two) times daily. 60 tablet   . atorvastatin (LIPITOR) 80 MG tablet Take 1 tablet (80 mg total) by mouth daily at 6 PM. 30 tablet 5  . camphor-menthol (SARNA) lotion Apply 1 application topically as needed for itching.    . clopidogrel (PLAVIX) 75 MG tablet Take 1 tablet (75 mg total) by mouth daily with breakfast. 30 tablet 11  . donepezil (ARICEPT) 10 MG tablet Take 1 tablet (10 mg total) by mouth daily. (Patient taking differently: Take 10 mg by mouth every evening. )    . Fluocinolone Acetonide 0.01 % OIL Apply 1 application topically daily as needed (itching). Apply to scalp    . FLUoxetine (PROZAC) 40 MG capsule Take 1 capsule (40 mg total) by mouth daily.  3  . fluticasone (FLONASE) 50 MCG/ACT nasal spray Place 1 spray into both nostrils 2 (two) times daily as needed for rhinitis. (Patient taking differently: Place 1 spray into both nostrils 2 (two) times daily as needed for rhinitis.)  2  . furosemide (LASIX) 20 MG tablet Take 3 tablets (60 mg total) by mouth daily. 90 tablet 5  . gabapentin (NEURONTIN) 100 MG capsule Take 100 mg by mouth at bedtime.     . hydroxypropyl methylcellulose / hypromellose (ISOPTO TEARS / GONIOVISC) 2.5 % ophthalmic solution Place 1 drop into both eyes 2 (two) times daily as needed for dry eyes.     . hyoscyamine (LEVSIN SL) 0.125 MG SL tablet Place 0.125 mg under the tongue every 4 (four) hours as needed for cramping.     . insulin aspart (NOVOLOG) 100 UNIT/ML injection Inject 0-15 Units into the skin 3 (three) times daily with meals. 10 mL 11  . ipratropium-albuterol (DUONEB) 0.5-2.5 (3) MG/3ML SOLN Take 3 mLs by nebulization 2 (two) times daily.     Marland Kitchen lamoTRIgine (LAMICTAL) 100 MG tablet Take 1 tablet (100 mg  total) by mouth 2 (two) times daily.    Marland Kitchen losartan (COZAAR) 25 MG tablet Take 12.5 mg by mouth daily.    . Melatonin 3 MG TABS Take 3 mg by mouth at bedtime.     . Multiple Vitamin (MULTIVITAMIN  WITH MINERALS) TABS tablet Take 1 tablet by mouth daily.    . nitroGLYCERIN (NITROSTAT) 0.4 MG SL tablet Place 1 tablet (0.4 mg total) under the tongue every 5 (five) minutes as needed (as needed for chest pain or with symptoms of esophageal spasm). 25 tablet 2  . OXYGEN Inhale 2.5 L into the lungs.    . pantoprazole (PROTONIX) 40 MG tablet Take 1 tablet (40 mg total) by mouth 2 (two) times daily. 30 tablet 0  . potassium chloride (K-DUR) 10 MEQ tablet Take 10 mEq by mouth daily.     . psyllium (REGULOID) 0.52 g capsule Take 0.52 g by mouth 2 (two) times daily.    Marland Kitchen spironolactone (ALDACTONE) 25 MG tablet Take 1 tablet (25 mg total) by mouth daily.    . traZODone (DESYREL) 100 MG tablet Take 1 tablet (100 mg total) by mouth at bedtime.    . triamcinolone lotion (KENALOG) 0.1 % Apply 1 application topically as needed.    Marland Kitchen UNABLE TO FIND CPAP - every night    . gabapentin (NEURONTIN) 100 MG capsule Take 100-300 mg by mouth at bedtime as needed (for itching).     . ranitidine (ZANTAC) 150 MG tablet Take 150 mg by mouth daily.      No facility-administered medications prior to visit.      Allergies:   Cephalexin; Lac bovis; Other; Pentazocine; Tizanidine; Cephalexin; Doxycycline; Pentazocine lactate; Tape; and Zanaflex [tizanidine hcl]   Social History   Socioeconomic History  . Marital status: Married    Spouse name: Geophysical data processor  . Number of children: 2  . Years of education: college  . Highest education level: Not on file  Occupational History  . 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 Financial resource strain: Not on file  . Food insecurity:    Worry: Not on file    Inability: Not on file  . Transportation needs:    Medical: Not on file     Non-medical: Not on file  Tobacco Use  . Smoking status: Never Smoker  . Smokeless tobacco: Never Used  Substance and Sexual Activity  . Alcohol use: No    Comment: pt states "I used to but not anymore"  . Drug use: No  . Sexual activity: Not Currently  Lifestyle  . Physical activity:    Days per week: Not on file    Minutes per session: Not on file  . Stress: Not on file  Relationships  . Social connections:    Talks on phone: Not on file    Gets together: Not on file    Attends religious service: Not on file    Active member of club or organization: Not on file    Attends meetings of clubs or organizations: Not on file    Relationship status: Not on file  Other Topics Concern  . Not on file  Social History Narrative   Patient lives at home with  wife      Patient drinks 2 cups of coffee a day.patient is left handed     Family History:  The patient's family history includes Asthma in his mother; Emphysema in his mother; Heart disease in his father; Hypertension in his mother; Pancreatic cancer in his paternal uncle; Prostate cancer in his paternal grandfather; Stroke in his paternal grandmother.   ROS:   Please see the history of present illness.    ROS All other systems reviewed  and are negative.   PHYSICAL EXAM:   VS:  BP 106/66   Pulse 81   Ht 5\' 6"  (1.676 m)   Wt 190 lb 12.8 oz (86.5 kg)   BMI 30.80 kg/m    GEN: chronically ill appearing WM sitting wheelchair, in no acute distress. Constantly swallowing air and spitting up saliva into a cup.  HEENT: normal  Neck: no JVD, carotid bruits, or masses Cardiac: RRR; no murmurs, rubs, or gallops,no edema  Respiratory:  clear to auscultation bilaterally, normal work of breathing GI: soft, nontender, nondistended, + BS MS: no deformity or atrophy  Skin: warm and dry, no rash Neuro:  Alert and Oriented x 3, Strength and sensation are intact Psych: euthymic mood, full affect  Wt Readings from Last 3 Encounters:    03/04/18 190 lb 12.8 oz (86.5 kg)  01/31/18 190 lb 6.4 oz (86.4 kg)  01/13/18 190 lb 9.6 oz (86.5 kg)      Studies/Labs Reviewed:   EKG:  EKG is not ordered today.   Recent Labs: 09/30/2017: TSH 1.239 10/06/2017: Magnesium 2.4 01/26/2018: ALT 17; B Natriuretic Peptide 303.2 01/29/2018: BUN 21; Creatinine, Ser 1.12; Hemoglobin 9.5; Platelets 202; Potassium 3.4; Sodium 137   Lipid Panel    Component Value Date/Time   CHOL 144 10/05/2017 0426   CHOL 187 03/31/2015 1123   TRIG 77 10/05/2017 0426   HDL 45 10/05/2017 0426   HDL 34 (L) 03/31/2015 1123   CHOLHDL 3.2 10/05/2017 0426   VLDL 15 10/05/2017 0426   LDLCALC 84 10/05/2017 0426   LDLCALC Comment 03/31/2015 1123    Ecg today shows NSR rate 79. Nonspecific ST abnormality. QTc 481 msec. I have personally reviewed and interpreted this study.   Additional studies/ records that were reviewed today include:   Echo 12/15/2017 LV EF: 35% -   40% Study Conclusions  - Procedure narrative: Transthoracic echocardiography for left   ventricular function evaluation. Image quality was adequate. The   study was technically difficult, as a result of poor patient   compliance and restricted patient mobility. - Left ventricle: The cavity size was normal. Wall thickness was   increased in a pattern of mild LVH. Systolic function was   moderately reduced. The estimated ejection fraction was in the   range of 35% to 40%. Diffuse hypokinesis with regional variation. - Mitral valve: Mildly thickened leaflets . There was mild   regurgitation. - Left atrium: The atrium was mildly dilated.  Impressions:  - Compared to a prior study in 09/2017, the LVEF has improved from   20-25% up to 35-40%.  Echo 01/27/18: Study Conclusions  - Left ventricle: The cavity size was mildly dilated. Systolic   function was severely reduced. The estimated ejection fraction   was in the range of 25% to 30%. Diffuse hypokinesis with worse in   the basal  anteroseptum, basal to mid anterolateral, and   inferolateral myocardium. Doppler parameters are consistent with   a reversible restrictive pattern, indicative of decreased left   ventricular diastolic compliance and/or increased left atrial   pressure (grade 3 diastolic dysfunction). Doppler parameters are   consistent with high ventricular filling pressure. - Aortic valve: Valve mobility was restricted. Transvalvular   velocity was within the normal range. There was no stenosis.   There was mild regurgitation. - Mitral valve: Transvalvular velocity was within the normal range.   There was no evidence for stenosis. There was mild to moderate   regurgitation. Valve area by continuity equation (using  LVOT   flow): 0.47 cm^2. - Left atrium: The atrium was moderately dilated. - Right ventricle: The cavity size was normal. Wall thickness was   normal. Systolic function was normal. - Tricuspid valve: There was mild regurgitation. - Pulmonary arteries: Systolic pressure was moderately increased.   PA peak pressure: 50 mm Hg (S).  Cardiac cath/PCI: 01/28/18:Procedures   CORONARY STENT INTERVENTION  LEFT HEART CATH AND CORS/GRAFTS ANGIOGRAPHY  Conclusion   1. Severe native vessel CAD with total occlusion of the LAD, critical stenosis of the distal left main/ostial circumflex, and severe stenosis of the RCA 2. S/P CABG with continued patency of the LIMA-LAD and total occlusion of the SVG-OM, SVG-PDA, and SVG-diagonal 3. Successful PCI of the left main/ostial circumflex with a 3.5x16 mm Synergy DES  Recommend:  Medical therapy for residual CAD unless recurrent ACS, then consider PCI of the RCA ? The RCA is tortuous and at significant risk of complication with PCI  Continue cangrelor x 2 hours, then load with plavix 600 mg (orders written)  Plavix, ASA, apixaban x 3 days, then stop ASA  Ok to resume apixaban in AM (orders written)  Fluids written at [facility] because of high LVEDP. Consider  diuresis tomorrow if creatinine stable     ASSESSMENT:    1. Chronic systolic congestive heart failure (HCC)      PLAN:  In order of problems listed above:  1. Chronic systolic CHF with last EF 25-30% due to ischemic cardiomyopathy. Euvolemic on physical exam- weight is stable. On losartan and aldactone. Unable to titrate medication further due to low BP. Plan to repeat Echo prior to next visit in 3 months. Will check BMET today.  2. Deconditioning: He has been transferred to [facility] [place] and has been undergoing physical therapy, both from clinical perspective and functional perspective, he has significantly improved but is still quite limited. I think continued SNF care is appropriate. Palliative care consulted.   3. CAD s/p CABG. Recent cath showed patent LIMA to the LAD but all SVGs occluded. S/p DES of left main/ostial LCx. Significant residual disease in a very tortuous RCA. Continue Plavix and Eliquis for one year. Would not recommend PCI of the RCA at this time since he is not having significant angina. Some increased risk of PCI due to tortuosity of vessel.    4. Hypertension: Blood pressure well controlled on current medication  5. Hyperlipidemia: On Lipitor.  Cholesterol quite well controlled on last lipid panel  6. History of PE in 2015: On Eliquis, no recurrence   Follow up in 3 months.    Medication Adjustments/Labs and Tests Ordered: Current medicines are reviewed at length with the patient today.  Concerns regarding medicines are outlined above.  Medication changes, Labs and Tests ordered today are listed in the Patient Instructions below. Patient Instructions  Your physician recommends that you return for lab work TODAY  Your physician has requested that you have an echocardiogram. Echocardiography is a painless test that uses sound waves to create images of your heart. It provides your doctor with information about the size and shape of your heart and how  well your heart's chambers and valves are working. This procedure takes approximately one hour. There are no restrictions for this procedure. -- due: October 2019 -- [address]  Your physician recommends that you schedule a follow-up appointment in: THREE MONTHS with Dr. Swaziland      Signed, Baileigh Modisette Swaziland, MD  03/04/2018 2:49 PM  [facility] [address] [place], [place], [place]  [address] Phone: [phone]; Fax: [phone]

## 2018-03-04 ENCOUNTER — Ambulatory Visit (INDEPENDENT_AMBULATORY_CARE_PROVIDER_SITE_OTHER): Payer: Medicare Other | Admitting: Cardiology

## 2018-03-04 ENCOUNTER — Encounter: Payer: Self-pay | Admitting: Cardiology

## 2018-03-04 VITALS — BP 106/66 | HR 81 | Ht 66.0 in | Wt 190.8 lb

## 2018-03-04 DIAGNOSIS — I10 Essential (primary) hypertension: Secondary | ICD-10-CM

## 2018-03-04 DIAGNOSIS — I5022 Chronic systolic (congestive) heart failure: Secondary | ICD-10-CM | POA: Diagnosis not present

## 2018-03-04 DIAGNOSIS — Z8673 Personal history of transient ischemic attack (TIA), and cerebral infarction without residual deficits: Secondary | ICD-10-CM | POA: Diagnosis not present

## 2018-03-04 DIAGNOSIS — I25810 Atherosclerosis of coronary artery bypass graft(s) without angina pectoris: Secondary | ICD-10-CM

## 2018-03-04 NOTE — Patient Instructions (Signed)
Your physician recommends that you return for lab work TODAY  Your physician has requested that you have an echocardiogram. Echocardiography is a painless test that uses sound waves to create images of your heart. It provides your doctor with information about the size and shape of your heart and how well your heart's chambers and valves are working. This procedure takes approximately one hour. There are no restrictions for this procedure. -- due: October 2019 -- [address] [place] physician recommends that you schedule a follow-up appointment in: [place] with Dr. Martinique

## 2018-03-05 DIAGNOSIS — Z515 Encounter for palliative care: Secondary | ICD-10-CM | POA: Diagnosis not present

## 2018-03-05 DIAGNOSIS — I69359 Hemiplegia and hemiparesis following cerebral infarction affecting unspecified side: Secondary | ICD-10-CM | POA: Diagnosis not present

## 2018-03-05 DIAGNOSIS — I340 Nonrheumatic mitral (valve) insufficiency: Secondary | ICD-10-CM | POA: Diagnosis not present

## 2018-03-05 DIAGNOSIS — I509 Heart failure, unspecified: Secondary | ICD-10-CM | POA: Diagnosis not present

## 2018-03-05 DIAGNOSIS — I2510 Atherosclerotic heart disease of native coronary artery without angina pectoris: Secondary | ICD-10-CM | POA: Diagnosis not present

## 2018-03-05 LAB — BASIC METABOLIC PANEL
BUN/Creatinine Ratio: 13 (ref 10–24)
BUN: 12 mg/dL (ref 8–27)
CALCIUM: 9.2 mg/dL (ref 8.6–10.2)
CO2: 25 mmol/L (ref 20–29)
CREATININE: 0.91 mg/dL (ref 0.76–1.27)
Chloride: 98 mmol/L (ref 96–106)
GFR calc Af Amer: 96 mL/min/{1.73_m2} (ref >59)
GFR, EST NON AFRICAN AMERICAN: 83 mL/min/{1.73_m2} (ref >59)
Glucose: 151 mg/dL — ABNORMAL HIGH (ref 65–99)
Potassium: 4 mmol/L (ref 3.5–5.2)
SODIUM: 139 mmol/L (ref 134–144)

## 2018-03-09 DIAGNOSIS — Z77098 Contact with and (suspected) exposure to other hazardous, chiefly nonmedicinal, chemicals: Secondary | ICD-10-CM | POA: Diagnosis not present

## 2018-03-09 DIAGNOSIS — Z86711 Personal history of pulmonary embolism: Secondary | ICD-10-CM | POA: Diagnosis not present

## 2018-03-09 DIAGNOSIS — Z951 Presence of aortocoronary bypass graft: Secondary | ICD-10-CM | POA: Diagnosis not present

## 2018-03-09 DIAGNOSIS — G4733 Obstructive sleep apnea (adult) (pediatric): Secondary | ICD-10-CM | POA: Diagnosis not present

## 2018-03-09 DIAGNOSIS — Z9981 Dependence on supplemental oxygen: Secondary | ICD-10-CM | POA: Diagnosis not present

## 2018-03-09 DIAGNOSIS — I4892 Unspecified atrial flutter: Secondary | ICD-10-CM | POA: Diagnosis not present

## 2018-03-09 DIAGNOSIS — F0390 Unspecified dementia without behavioral disturbance: Secondary | ICD-10-CM | POA: Diagnosis not present

## 2018-03-09 DIAGNOSIS — J449 Chronic obstructive pulmonary disease, unspecified: Secondary | ICD-10-CM | POA: Diagnosis not present

## 2018-03-09 DIAGNOSIS — G40909 Epilepsy, unspecified, not intractable, without status epilepticus: Secondary | ICD-10-CM | POA: Diagnosis not present

## 2018-03-09 DIAGNOSIS — Z77090 Contact with and (suspected) exposure to asbestos: Secondary | ICD-10-CM | POA: Diagnosis not present

## 2018-03-09 DIAGNOSIS — R1310 Dysphagia, unspecified: Secondary | ICD-10-CM | POA: Diagnosis not present

## 2018-03-09 DIAGNOSIS — I5042 Chronic combined systolic (congestive) and diastolic (congestive) heart failure: Secondary | ICD-10-CM | POA: Diagnosis not present

## 2018-03-09 DIAGNOSIS — I110 Hypertensive heart disease with heart failure: Secondary | ICD-10-CM | POA: Diagnosis not present

## 2018-03-09 DIAGNOSIS — K222 Esophageal obstruction: Secondary | ICD-10-CM | POA: Diagnosis not present

## 2018-03-09 DIAGNOSIS — I2510 Atherosclerotic heart disease of native coronary artery without angina pectoris: Secondary | ICD-10-CM | POA: Diagnosis not present

## 2018-03-10 DIAGNOSIS — I110 Hypertensive heart disease with heart failure: Secondary | ICD-10-CM | POA: Diagnosis not present

## 2018-03-10 DIAGNOSIS — I4892 Unspecified atrial flutter: Secondary | ICD-10-CM | POA: Diagnosis not present

## 2018-03-10 DIAGNOSIS — K222 Esophageal obstruction: Secondary | ICD-10-CM | POA: Diagnosis not present

## 2018-03-10 DIAGNOSIS — Z9981 Dependence on supplemental oxygen: Secondary | ICD-10-CM | POA: Diagnosis not present

## 2018-03-10 DIAGNOSIS — R1310 Dysphagia, unspecified: Secondary | ICD-10-CM | POA: Diagnosis not present

## 2018-03-10 DIAGNOSIS — I5042 Chronic combined systolic (congestive) and diastolic (congestive) heart failure: Secondary | ICD-10-CM | POA: Diagnosis not present

## 2018-03-11 DIAGNOSIS — R1310 Dysphagia, unspecified: Secondary | ICD-10-CM | POA: Diagnosis not present

## 2018-03-11 DIAGNOSIS — I110 Hypertensive heart disease with heart failure: Secondary | ICD-10-CM | POA: Diagnosis not present

## 2018-03-11 DIAGNOSIS — I509 Heart failure, unspecified: Secondary | ICD-10-CM | POA: Diagnosis not present

## 2018-03-11 DIAGNOSIS — K222 Esophageal obstruction: Secondary | ICD-10-CM | POA: Diagnosis not present

## 2018-03-11 DIAGNOSIS — F339 Major depressive disorder, recurrent, unspecified: Secondary | ICD-10-CM | POA: Diagnosis not present

## 2018-03-11 DIAGNOSIS — F419 Anxiety disorder, unspecified: Secondary | ICD-10-CM | POA: Diagnosis not present

## 2018-03-11 DIAGNOSIS — I4892 Unspecified atrial flutter: Secondary | ICD-10-CM | POA: Diagnosis not present

## 2018-03-11 DIAGNOSIS — Z9981 Dependence on supplemental oxygen: Secondary | ICD-10-CM | POA: Diagnosis not present

## 2018-03-11 DIAGNOSIS — I2510 Atherosclerotic heart disease of native coronary artery without angina pectoris: Secondary | ICD-10-CM | POA: Diagnosis not present

## 2018-03-11 DIAGNOSIS — I5042 Chronic combined systolic (congestive) and diastolic (congestive) heart failure: Secondary | ICD-10-CM | POA: Diagnosis not present

## 2018-03-11 DIAGNOSIS — I679 Cerebrovascular disease, unspecified: Secondary | ICD-10-CM | POA: Diagnosis not present

## 2018-03-17 DIAGNOSIS — Z9981 Dependence on supplemental oxygen: Secondary | ICD-10-CM | POA: Diagnosis not present

## 2018-03-17 DIAGNOSIS — I110 Hypertensive heart disease with heart failure: Secondary | ICD-10-CM | POA: Diagnosis not present

## 2018-03-17 DIAGNOSIS — K222 Esophageal obstruction: Secondary | ICD-10-CM | POA: Diagnosis not present

## 2018-03-17 DIAGNOSIS — I5042 Chronic combined systolic (congestive) and diastolic (congestive) heart failure: Secondary | ICD-10-CM | POA: Diagnosis not present

## 2018-03-17 DIAGNOSIS — I4892 Unspecified atrial flutter: Secondary | ICD-10-CM | POA: Diagnosis not present

## 2018-03-17 DIAGNOSIS — R1310 Dysphagia, unspecified: Secondary | ICD-10-CM | POA: Diagnosis not present

## 2018-03-20 DIAGNOSIS — Z9981 Dependence on supplemental oxygen: Secondary | ICD-10-CM | POA: Diagnosis not present

## 2018-03-20 DIAGNOSIS — I5042 Chronic combined systolic (congestive) and diastolic (congestive) heart failure: Secondary | ICD-10-CM | POA: Diagnosis not present

## 2018-03-20 DIAGNOSIS — I4892 Unspecified atrial flutter: Secondary | ICD-10-CM | POA: Diagnosis not present

## 2018-03-20 DIAGNOSIS — K222 Esophageal obstruction: Secondary | ICD-10-CM | POA: Diagnosis not present

## 2018-03-20 DIAGNOSIS — I110 Hypertensive heart disease with heart failure: Secondary | ICD-10-CM | POA: Diagnosis not present

## 2018-03-20 DIAGNOSIS — R1310 Dysphagia, unspecified: Secondary | ICD-10-CM | POA: Diagnosis not present

## 2018-03-24 DIAGNOSIS — R1310 Dysphagia, unspecified: Secondary | ICD-10-CM | POA: Diagnosis not present

## 2018-03-24 DIAGNOSIS — I110 Hypertensive heart disease with heart failure: Secondary | ICD-10-CM | POA: Diagnosis not present

## 2018-03-24 DIAGNOSIS — Z9981 Dependence on supplemental oxygen: Secondary | ICD-10-CM | POA: Diagnosis not present

## 2018-03-24 DIAGNOSIS — I5042 Chronic combined systolic (congestive) and diastolic (congestive) heart failure: Secondary | ICD-10-CM | POA: Diagnosis not present

## 2018-03-24 DIAGNOSIS — K222 Esophageal obstruction: Secondary | ICD-10-CM | POA: Diagnosis not present

## 2018-03-24 DIAGNOSIS — I4892 Unspecified atrial flutter: Secondary | ICD-10-CM | POA: Diagnosis not present

## 2018-03-26 DIAGNOSIS — K222 Esophageal obstruction: Secondary | ICD-10-CM | POA: Diagnosis not present

## 2018-03-26 DIAGNOSIS — Z86711 Personal history of pulmonary embolism: Secondary | ICD-10-CM | POA: Diagnosis not present

## 2018-03-26 DIAGNOSIS — I5042 Chronic combined systolic (congestive) and diastolic (congestive) heart failure: Secondary | ICD-10-CM | POA: Diagnosis not present

## 2018-03-26 DIAGNOSIS — I2510 Atherosclerotic heart disease of native coronary artery without angina pectoris: Secondary | ICD-10-CM | POA: Diagnosis not present

## 2018-03-26 DIAGNOSIS — J449 Chronic obstructive pulmonary disease, unspecified: Secondary | ICD-10-CM | POA: Diagnosis not present

## 2018-03-26 DIAGNOSIS — G4733 Obstructive sleep apnea (adult) (pediatric): Secondary | ICD-10-CM | POA: Diagnosis not present

## 2018-03-26 DIAGNOSIS — F0390 Unspecified dementia without behavioral disturbance: Secondary | ICD-10-CM | POA: Diagnosis not present

## 2018-03-26 DIAGNOSIS — Z9981 Dependence on supplemental oxygen: Secondary | ICD-10-CM | POA: Diagnosis not present

## 2018-03-26 DIAGNOSIS — I4892 Unspecified atrial flutter: Secondary | ICD-10-CM | POA: Diagnosis not present

## 2018-03-26 DIAGNOSIS — Z77098 Contact with and (suspected) exposure to other hazardous, chiefly nonmedicinal, chemicals: Secondary | ICD-10-CM | POA: Diagnosis not present

## 2018-03-26 DIAGNOSIS — R1310 Dysphagia, unspecified: Secondary | ICD-10-CM | POA: Diagnosis not present

## 2018-03-26 DIAGNOSIS — Z77090 Contact with and (suspected) exposure to asbestos: Secondary | ICD-10-CM | POA: Diagnosis not present

## 2018-03-26 DIAGNOSIS — Z951 Presence of aortocoronary bypass graft: Secondary | ICD-10-CM | POA: Diagnosis not present

## 2018-03-26 DIAGNOSIS — I110 Hypertensive heart disease with heart failure: Secondary | ICD-10-CM | POA: Diagnosis not present

## 2018-03-26 DIAGNOSIS — G40909 Epilepsy, unspecified, not intractable, without status epilepticus: Secondary | ICD-10-CM | POA: Diagnosis not present

## 2018-03-30 DIAGNOSIS — I5042 Chronic combined systolic (congestive) and diastolic (congestive) heart failure: Secondary | ICD-10-CM | POA: Diagnosis not present

## 2018-03-30 DIAGNOSIS — Z9981 Dependence on supplemental oxygen: Secondary | ICD-10-CM | POA: Diagnosis not present

## 2018-03-30 DIAGNOSIS — I4892 Unspecified atrial flutter: Secondary | ICD-10-CM | POA: Diagnosis not present

## 2018-03-30 DIAGNOSIS — R1310 Dysphagia, unspecified: Secondary | ICD-10-CM | POA: Diagnosis not present

## 2018-03-30 DIAGNOSIS — K222 Esophageal obstruction: Secondary | ICD-10-CM | POA: Diagnosis not present

## 2018-03-30 DIAGNOSIS — I110 Hypertensive heart disease with heart failure: Secondary | ICD-10-CM | POA: Diagnosis not present

## 2018-04-06 DIAGNOSIS — Z9981 Dependence on supplemental oxygen: Secondary | ICD-10-CM | POA: Diagnosis not present

## 2018-04-06 DIAGNOSIS — F329 Major depressive disorder, single episode, unspecified: Secondary | ICD-10-CM | POA: Diagnosis not present

## 2018-04-06 DIAGNOSIS — N3941 Urge incontinence: Secondary | ICD-10-CM | POA: Diagnosis not present

## 2018-04-06 DIAGNOSIS — I4892 Unspecified atrial flutter: Secondary | ICD-10-CM | POA: Diagnosis not present

## 2018-04-06 DIAGNOSIS — I5042 Chronic combined systolic (congestive) and diastolic (congestive) heart failure: Secondary | ICD-10-CM | POA: Diagnosis not present

## 2018-04-06 DIAGNOSIS — I5020 Unspecified systolic (congestive) heart failure: Secondary | ICD-10-CM | POA: Diagnosis not present

## 2018-04-06 DIAGNOSIS — I110 Hypertensive heart disease with heart failure: Secondary | ICD-10-CM | POA: Diagnosis not present

## 2018-04-06 DIAGNOSIS — R1310 Dysphagia, unspecified: Secondary | ICD-10-CM | POA: Diagnosis not present

## 2018-04-06 DIAGNOSIS — I4891 Unspecified atrial fibrillation: Secondary | ICD-10-CM | POA: Diagnosis not present

## 2018-04-06 DIAGNOSIS — F0390 Unspecified dementia without behavioral disturbance: Secondary | ICD-10-CM | POA: Diagnosis not present

## 2018-04-06 DIAGNOSIS — K222 Esophageal obstruction: Secondary | ICD-10-CM | POA: Diagnosis not present

## 2018-04-06 DIAGNOSIS — Z87442 Personal history of urinary calculi: Secondary | ICD-10-CM | POA: Diagnosis not present

## 2018-04-07 DIAGNOSIS — I4892 Unspecified atrial flutter: Secondary | ICD-10-CM | POA: Diagnosis not present

## 2018-04-07 DIAGNOSIS — I5042 Chronic combined systolic (congestive) and diastolic (congestive) heart failure: Secondary | ICD-10-CM | POA: Diagnosis not present

## 2018-04-07 DIAGNOSIS — I110 Hypertensive heart disease with heart failure: Secondary | ICD-10-CM | POA: Diagnosis not present

## 2018-04-07 DIAGNOSIS — Z9981 Dependence on supplemental oxygen: Secondary | ICD-10-CM | POA: Diagnosis not present

## 2018-04-07 DIAGNOSIS — K222 Esophageal obstruction: Secondary | ICD-10-CM | POA: Diagnosis not present

## 2018-04-07 DIAGNOSIS — R1310 Dysphagia, unspecified: Secondary | ICD-10-CM | POA: Diagnosis not present

## 2018-04-08 DIAGNOSIS — I110 Hypertensive heart disease with heart failure: Secondary | ICD-10-CM | POA: Diagnosis not present

## 2018-04-08 DIAGNOSIS — R1310 Dysphagia, unspecified: Secondary | ICD-10-CM | POA: Diagnosis not present

## 2018-04-08 DIAGNOSIS — Z9981 Dependence on supplemental oxygen: Secondary | ICD-10-CM | POA: Diagnosis not present

## 2018-04-08 DIAGNOSIS — K222 Esophageal obstruction: Secondary | ICD-10-CM | POA: Diagnosis not present

## 2018-04-08 DIAGNOSIS — I4892 Unspecified atrial flutter: Secondary | ICD-10-CM | POA: Diagnosis not present

## 2018-04-08 DIAGNOSIS — I5042 Chronic combined systolic (congestive) and diastolic (congestive) heart failure: Secondary | ICD-10-CM | POA: Diagnosis not present

## 2018-04-13 DIAGNOSIS — I4892 Unspecified atrial flutter: Secondary | ICD-10-CM | POA: Diagnosis not present

## 2018-04-13 DIAGNOSIS — I5042 Chronic combined systolic (congestive) and diastolic (congestive) heart failure: Secondary | ICD-10-CM | POA: Diagnosis not present

## 2018-04-13 DIAGNOSIS — Z9981 Dependence on supplemental oxygen: Secondary | ICD-10-CM | POA: Diagnosis not present

## 2018-04-13 DIAGNOSIS — I110 Hypertensive heart disease with heart failure: Secondary | ICD-10-CM | POA: Diagnosis not present

## 2018-04-13 DIAGNOSIS — R1310 Dysphagia, unspecified: Secondary | ICD-10-CM | POA: Diagnosis not present

## 2018-04-13 DIAGNOSIS — K222 Esophageal obstruction: Secondary | ICD-10-CM | POA: Diagnosis not present

## 2018-04-14 DIAGNOSIS — I110 Hypertensive heart disease with heart failure: Secondary | ICD-10-CM | POA: Diagnosis not present

## 2018-04-14 DIAGNOSIS — R1310 Dysphagia, unspecified: Secondary | ICD-10-CM | POA: Diagnosis not present

## 2018-04-14 DIAGNOSIS — K222 Esophageal obstruction: Secondary | ICD-10-CM | POA: Diagnosis not present

## 2018-04-14 DIAGNOSIS — I5042 Chronic combined systolic (congestive) and diastolic (congestive) heart failure: Secondary | ICD-10-CM | POA: Diagnosis not present

## 2018-04-14 DIAGNOSIS — I4892 Unspecified atrial flutter: Secondary | ICD-10-CM | POA: Diagnosis not present

## 2018-04-14 DIAGNOSIS — Z9981 Dependence on supplemental oxygen: Secondary | ICD-10-CM | POA: Diagnosis not present

## 2018-04-21 DIAGNOSIS — I5042 Chronic combined systolic (congestive) and diastolic (congestive) heart failure: Secondary | ICD-10-CM | POA: Diagnosis not present

## 2018-04-21 DIAGNOSIS — K222 Esophageal obstruction: Secondary | ICD-10-CM | POA: Diagnosis not present

## 2018-04-21 DIAGNOSIS — I4892 Unspecified atrial flutter: Secondary | ICD-10-CM | POA: Diagnosis not present

## 2018-04-21 DIAGNOSIS — Z9981 Dependence on supplemental oxygen: Secondary | ICD-10-CM | POA: Diagnosis not present

## 2018-04-21 DIAGNOSIS — R1310 Dysphagia, unspecified: Secondary | ICD-10-CM | POA: Diagnosis not present

## 2018-04-21 DIAGNOSIS — I110 Hypertensive heart disease with heart failure: Secondary | ICD-10-CM | POA: Diagnosis not present

## 2018-04-23 DIAGNOSIS — I5042 Chronic combined systolic (congestive) and diastolic (congestive) heart failure: Secondary | ICD-10-CM | POA: Diagnosis not present

## 2018-04-23 DIAGNOSIS — I110 Hypertensive heart disease with heart failure: Secondary | ICD-10-CM | POA: Diagnosis not present

## 2018-04-23 DIAGNOSIS — Z9981 Dependence on supplemental oxygen: Secondary | ICD-10-CM | POA: Diagnosis not present

## 2018-04-23 DIAGNOSIS — R1310 Dysphagia, unspecified: Secondary | ICD-10-CM | POA: Diagnosis not present

## 2018-04-23 DIAGNOSIS — K222 Esophageal obstruction: Secondary | ICD-10-CM | POA: Diagnosis not present

## 2018-04-23 DIAGNOSIS — I4892 Unspecified atrial flutter: Secondary | ICD-10-CM | POA: Diagnosis not present

## 2018-04-26 DIAGNOSIS — Z9981 Dependence on supplemental oxygen: Secondary | ICD-10-CM | POA: Diagnosis not present

## 2018-04-26 DIAGNOSIS — Z77090 Contact with and (suspected) exposure to asbestos: Secondary | ICD-10-CM | POA: Diagnosis not present

## 2018-04-26 DIAGNOSIS — F0390 Unspecified dementia without behavioral disturbance: Secondary | ICD-10-CM | POA: Diagnosis not present

## 2018-04-26 DIAGNOSIS — J449 Chronic obstructive pulmonary disease, unspecified: Secondary | ICD-10-CM | POA: Diagnosis not present

## 2018-04-26 DIAGNOSIS — I2510 Atherosclerotic heart disease of native coronary artery without angina pectoris: Secondary | ICD-10-CM | POA: Diagnosis not present

## 2018-04-26 DIAGNOSIS — G40909 Epilepsy, unspecified, not intractable, without status epilepticus: Secondary | ICD-10-CM | POA: Diagnosis not present

## 2018-04-26 DIAGNOSIS — Z951 Presence of aortocoronary bypass graft: Secondary | ICD-10-CM | POA: Diagnosis not present

## 2018-04-26 DIAGNOSIS — Z86711 Personal history of pulmonary embolism: Secondary | ICD-10-CM | POA: Diagnosis not present

## 2018-04-26 DIAGNOSIS — I110 Hypertensive heart disease with heart failure: Secondary | ICD-10-CM | POA: Diagnosis not present

## 2018-04-26 DIAGNOSIS — K222 Esophageal obstruction: Secondary | ICD-10-CM | POA: Diagnosis not present

## 2018-04-26 DIAGNOSIS — I5042 Chronic combined systolic (congestive) and diastolic (congestive) heart failure: Secondary | ICD-10-CM | POA: Diagnosis not present

## 2018-04-26 DIAGNOSIS — G4733 Obstructive sleep apnea (adult) (pediatric): Secondary | ICD-10-CM | POA: Diagnosis not present

## 2018-04-26 DIAGNOSIS — Z77098 Contact with and (suspected) exposure to other hazardous, chiefly nonmedicinal, chemicals: Secondary | ICD-10-CM | POA: Diagnosis not present

## 2018-04-26 DIAGNOSIS — R1310 Dysphagia, unspecified: Secondary | ICD-10-CM | POA: Diagnosis not present

## 2018-04-26 DIAGNOSIS — I4892 Unspecified atrial flutter: Secondary | ICD-10-CM | POA: Diagnosis not present

## 2018-04-28 DIAGNOSIS — I4892 Unspecified atrial flutter: Secondary | ICD-10-CM | POA: Diagnosis not present

## 2018-04-28 DIAGNOSIS — K222 Esophageal obstruction: Secondary | ICD-10-CM | POA: Diagnosis not present

## 2018-04-28 DIAGNOSIS — Z9981 Dependence on supplemental oxygen: Secondary | ICD-10-CM | POA: Diagnosis not present

## 2018-04-28 DIAGNOSIS — I110 Hypertensive heart disease with heart failure: Secondary | ICD-10-CM | POA: Diagnosis not present

## 2018-04-28 DIAGNOSIS — R1310 Dysphagia, unspecified: Secondary | ICD-10-CM | POA: Diagnosis not present

## 2018-04-28 DIAGNOSIS — I5042 Chronic combined systolic (congestive) and diastolic (congestive) heart failure: Secondary | ICD-10-CM | POA: Diagnosis not present

## 2018-05-01 DIAGNOSIS — F0390 Unspecified dementia without behavioral disturbance: Secondary | ICD-10-CM | POA: Diagnosis not present

## 2018-05-01 DIAGNOSIS — I679 Cerebrovascular disease, unspecified: Secondary | ICD-10-CM | POA: Diagnosis not present

## 2018-05-01 DIAGNOSIS — I4891 Unspecified atrial fibrillation: Secondary | ICD-10-CM | POA: Diagnosis not present

## 2018-05-01 DIAGNOSIS — I2510 Atherosclerotic heart disease of native coronary artery without angina pectoris: Secondary | ICD-10-CM | POA: Diagnosis not present

## 2018-05-01 DIAGNOSIS — I509 Heart failure, unspecified: Secondary | ICD-10-CM | POA: Diagnosis not present

## 2018-05-05 DIAGNOSIS — K222 Esophageal obstruction: Secondary | ICD-10-CM | POA: Diagnosis not present

## 2018-05-05 DIAGNOSIS — R1310 Dysphagia, unspecified: Secondary | ICD-10-CM | POA: Diagnosis not present

## 2018-05-05 DIAGNOSIS — I4892 Unspecified atrial flutter: Secondary | ICD-10-CM | POA: Diagnosis not present

## 2018-05-05 DIAGNOSIS — I110 Hypertensive heart disease with heart failure: Secondary | ICD-10-CM | POA: Diagnosis not present

## 2018-05-05 DIAGNOSIS — Z9981 Dependence on supplemental oxygen: Secondary | ICD-10-CM | POA: Diagnosis not present

## 2018-05-05 DIAGNOSIS — I5042 Chronic combined systolic (congestive) and diastolic (congestive) heart failure: Secondary | ICD-10-CM | POA: Diagnosis not present

## 2018-05-06 ENCOUNTER — Telehealth: Payer: Self-pay

## 2018-05-06 NOTE — Telephone Encounter (Signed)
Rn receive call from Dr. Elinor Parkinson at [phone]. Dr Elinor Parkinson stated is in nursing home and is under hospice care. Patients health is declining, and he has months to live. She stated pt may lose ability to swallow, and was inquiring about the lamictal for seizures. She wanted to know should it be continue or can pt be taper off the medication. PT will be unable to attend to appts outside the facility. She can manage the lamictal, but wants Janett Billow NP advice on it.RN stated provider is out but a message can be sent to her. Dr Elinor Parkinson gave her number of [phone] to be call at.

## 2018-05-07 NOTE — Telephone Encounter (Signed)
RN call Dr. Elinor Parkinson at [phone]. Rn stated should the appt with Janett Billow NP be kept this month. Dr.Cross recommend it be cancel because pt is symptomatic and health is declining. Appt cancel.

## 2018-05-07 NOTE — Telephone Encounter (Signed)
Called Dr. Elinor Parkinson regarding recent concern about stopping Lamictal. Left message requesting call back. Will attempt to call again tomorrow.

## 2018-05-08 DIAGNOSIS — K222 Esophageal obstruction: Secondary | ICD-10-CM | POA: Diagnosis not present

## 2018-05-08 DIAGNOSIS — Z9981 Dependence on supplemental oxygen: Secondary | ICD-10-CM | POA: Diagnosis not present

## 2018-05-08 DIAGNOSIS — I4892 Unspecified atrial flutter: Secondary | ICD-10-CM | POA: Diagnosis not present

## 2018-05-08 DIAGNOSIS — I5042 Chronic combined systolic (congestive) and diastolic (congestive) heart failure: Secondary | ICD-10-CM | POA: Diagnosis not present

## 2018-05-08 DIAGNOSIS — I110 Hypertensive heart disease with heart failure: Secondary | ICD-10-CM | POA: Diagnosis not present

## 2018-05-08 DIAGNOSIS — R1310 Dysphagia, unspecified: Secondary | ICD-10-CM | POA: Diagnosis not present

## 2018-05-08 NOTE — Telephone Encounter (Signed)
Spoke to Dr. Elinor Parkinson regarding Lamictal. Recommended to change to Lamictal disintegrating tablet due to swallowing concerns. Advised that if it comes to a point that patient is unresponsive, can treat any seizure activity with ativan.  Per Dr. Elinor Parkinson, patients wife is questioning whether this medication can be stopped as he has not had seizure activity in years and the seizure occurred due to substance abuse. Advised that they can attempt to wean at a slow rate of over 2-6 months (per UpToDate) but to restart if seizure activity occurs. Expected progrosis is months. As patient is now on hospice, Dr. Elinor Parkinson will take over all care including Lamictal management.  Advised Dr. Elinor Parkinson to call us with additional questions or concerns.

## 2018-05-10 NOTE — Telephone Encounter (Signed)
Thanks. Agree with your plan of care

## 2018-05-12 DIAGNOSIS — R1310 Dysphagia, unspecified: Secondary | ICD-10-CM | POA: Diagnosis not present

## 2018-05-12 DIAGNOSIS — I4892 Unspecified atrial flutter: Secondary | ICD-10-CM | POA: Diagnosis not present

## 2018-05-12 DIAGNOSIS — I5042 Chronic combined systolic (congestive) and diastolic (congestive) heart failure: Secondary | ICD-10-CM | POA: Diagnosis not present

## 2018-05-12 DIAGNOSIS — Z9981 Dependence on supplemental oxygen: Secondary | ICD-10-CM | POA: Diagnosis not present

## 2018-05-12 DIAGNOSIS — K222 Esophageal obstruction: Secondary | ICD-10-CM | POA: Diagnosis not present

## 2018-05-12 DIAGNOSIS — I110 Hypertensive heart disease with heart failure: Secondary | ICD-10-CM | POA: Diagnosis not present

## 2018-05-18 ENCOUNTER — Ambulatory Visit: Payer: Medicare Other | Admitting: Adult Health

## 2018-05-19 DIAGNOSIS — K222 Esophageal obstruction: Secondary | ICD-10-CM | POA: Diagnosis not present

## 2018-05-19 DIAGNOSIS — I110 Hypertensive heart disease with heart failure: Secondary | ICD-10-CM | POA: Diagnosis not present

## 2018-05-19 DIAGNOSIS — I5042 Chronic combined systolic (congestive) and diastolic (congestive) heart failure: Secondary | ICD-10-CM | POA: Diagnosis not present

## 2018-05-19 DIAGNOSIS — R1310 Dysphagia, unspecified: Secondary | ICD-10-CM | POA: Diagnosis not present

## 2018-05-19 DIAGNOSIS — I4892 Unspecified atrial flutter: Secondary | ICD-10-CM | POA: Diagnosis not present

## 2018-05-19 DIAGNOSIS — Z9981 Dependence on supplemental oxygen: Secondary | ICD-10-CM | POA: Diagnosis not present

## 2018-05-26 DIAGNOSIS — I4892 Unspecified atrial flutter: Secondary | ICD-10-CM | POA: Diagnosis not present

## 2018-05-26 DIAGNOSIS — K222 Esophageal obstruction: Secondary | ICD-10-CM | POA: Diagnosis not present

## 2018-05-26 DIAGNOSIS — Z9981 Dependence on supplemental oxygen: Secondary | ICD-10-CM | POA: Diagnosis not present

## 2018-05-26 DIAGNOSIS — R1310 Dysphagia, unspecified: Secondary | ICD-10-CM | POA: Diagnosis not present

## 2018-05-26 DIAGNOSIS — Z951 Presence of aortocoronary bypass graft: Secondary | ICD-10-CM | POA: Diagnosis not present

## 2018-05-26 DIAGNOSIS — Z77090 Contact with and (suspected) exposure to asbestos: Secondary | ICD-10-CM | POA: Diagnosis not present

## 2018-05-26 DIAGNOSIS — Z77098 Contact with and (suspected) exposure to other hazardous, chiefly nonmedicinal, chemicals: Secondary | ICD-10-CM | POA: Diagnosis not present

## 2018-05-26 DIAGNOSIS — Z86711 Personal history of pulmonary embolism: Secondary | ICD-10-CM | POA: Diagnosis not present

## 2018-05-26 DIAGNOSIS — G40909 Epilepsy, unspecified, not intractable, without status epilepticus: Secondary | ICD-10-CM | POA: Diagnosis not present

## 2018-05-26 DIAGNOSIS — G4733 Obstructive sleep apnea (adult) (pediatric): Secondary | ICD-10-CM | POA: Diagnosis not present

## 2018-05-26 DIAGNOSIS — I110 Hypertensive heart disease with heart failure: Secondary | ICD-10-CM | POA: Diagnosis not present

## 2018-05-26 DIAGNOSIS — F0390 Unspecified dementia without behavioral disturbance: Secondary | ICD-10-CM | POA: Diagnosis not present

## 2018-05-26 DIAGNOSIS — I5042 Chronic combined systolic (congestive) and diastolic (congestive) heart failure: Secondary | ICD-10-CM | POA: Diagnosis not present

## 2018-05-26 DIAGNOSIS — J449 Chronic obstructive pulmonary disease, unspecified: Secondary | ICD-10-CM | POA: Diagnosis not present

## 2018-05-26 DIAGNOSIS — I2510 Atherosclerotic heart disease of native coronary artery without angina pectoris: Secondary | ICD-10-CM | POA: Diagnosis not present

## 2018-05-27 DIAGNOSIS — R1310 Dysphagia, unspecified: Secondary | ICD-10-CM | POA: Diagnosis not present

## 2018-05-27 DIAGNOSIS — I110 Hypertensive heart disease with heart failure: Secondary | ICD-10-CM | POA: Diagnosis not present

## 2018-05-27 DIAGNOSIS — I5042 Chronic combined systolic (congestive) and diastolic (congestive) heart failure: Secondary | ICD-10-CM | POA: Diagnosis not present

## 2018-05-27 DIAGNOSIS — I4892 Unspecified atrial flutter: Secondary | ICD-10-CM | POA: Diagnosis not present

## 2018-05-27 DIAGNOSIS — K222 Esophageal obstruction: Secondary | ICD-10-CM | POA: Diagnosis not present

## 2018-05-27 DIAGNOSIS — Z9981 Dependence on supplemental oxygen: Secondary | ICD-10-CM | POA: Diagnosis not present

## 2018-06-01 DIAGNOSIS — K222 Esophageal obstruction: Secondary | ICD-10-CM | POA: Diagnosis not present

## 2018-06-01 DIAGNOSIS — I4892 Unspecified atrial flutter: Secondary | ICD-10-CM | POA: Diagnosis not present

## 2018-06-01 DIAGNOSIS — I5042 Chronic combined systolic (congestive) and diastolic (congestive) heart failure: Secondary | ICD-10-CM | POA: Diagnosis not present

## 2018-06-01 DIAGNOSIS — R1310 Dysphagia, unspecified: Secondary | ICD-10-CM | POA: Diagnosis not present

## 2018-06-01 DIAGNOSIS — I110 Hypertensive heart disease with heart failure: Secondary | ICD-10-CM | POA: Diagnosis not present

## 2018-06-01 DIAGNOSIS — Z9981 Dependence on supplemental oxygen: Secondary | ICD-10-CM | POA: Diagnosis not present

## 2018-06-02 DIAGNOSIS — I5042 Chronic combined systolic (congestive) and diastolic (congestive) heart failure: Secondary | ICD-10-CM | POA: Diagnosis not present

## 2018-06-02 DIAGNOSIS — I4892 Unspecified atrial flutter: Secondary | ICD-10-CM | POA: Diagnosis not present

## 2018-06-02 DIAGNOSIS — I110 Hypertensive heart disease with heart failure: Secondary | ICD-10-CM | POA: Diagnosis not present

## 2018-06-02 DIAGNOSIS — Z9981 Dependence on supplemental oxygen: Secondary | ICD-10-CM | POA: Diagnosis not present

## 2018-06-02 DIAGNOSIS — R1310 Dysphagia, unspecified: Secondary | ICD-10-CM | POA: Diagnosis not present

## 2018-06-02 DIAGNOSIS — K222 Esophageal obstruction: Secondary | ICD-10-CM | POA: Diagnosis not present

## 2018-06-04 ENCOUNTER — Other Ambulatory Visit (HOSPITAL_COMMUNITY): Payer: Non-veteran care

## 2018-06-09 DIAGNOSIS — I4892 Unspecified atrial flutter: Secondary | ICD-10-CM | POA: Diagnosis not present

## 2018-06-09 DIAGNOSIS — I5042 Chronic combined systolic (congestive) and diastolic (congestive) heart failure: Secondary | ICD-10-CM | POA: Diagnosis not present

## 2018-06-09 DIAGNOSIS — K222 Esophageal obstruction: Secondary | ICD-10-CM | POA: Diagnosis not present

## 2018-06-09 DIAGNOSIS — I110 Hypertensive heart disease with heart failure: Secondary | ICD-10-CM | POA: Diagnosis not present

## 2018-06-09 DIAGNOSIS — Z9981 Dependence on supplemental oxygen: Secondary | ICD-10-CM | POA: Diagnosis not present

## 2018-06-09 DIAGNOSIS — R1310 Dysphagia, unspecified: Secondary | ICD-10-CM | POA: Diagnosis not present

## 2018-06-16 DIAGNOSIS — I2510 Atherosclerotic heart disease of native coronary artery without angina pectoris: Secondary | ICD-10-CM | POA: Diagnosis not present

## 2018-06-16 DIAGNOSIS — I639 Cerebral infarction, unspecified: Secondary | ICD-10-CM | POA: Diagnosis not present

## 2018-06-16 DIAGNOSIS — I5042 Chronic combined systolic (congestive) and diastolic (congestive) heart failure: Secondary | ICD-10-CM | POA: Diagnosis not present

## 2018-06-16 DIAGNOSIS — K222 Esophageal obstruction: Secondary | ICD-10-CM | POA: Diagnosis not present

## 2018-06-16 DIAGNOSIS — R1310 Dysphagia, unspecified: Secondary | ICD-10-CM | POA: Diagnosis not present

## 2018-06-16 DIAGNOSIS — L309 Dermatitis, unspecified: Secondary | ICD-10-CM | POA: Diagnosis not present

## 2018-06-16 DIAGNOSIS — F0390 Unspecified dementia without behavioral disturbance: Secondary | ICD-10-CM | POA: Diagnosis not present

## 2018-06-16 DIAGNOSIS — I4892 Unspecified atrial flutter: Secondary | ICD-10-CM | POA: Diagnosis not present

## 2018-06-16 DIAGNOSIS — I110 Hypertensive heart disease with heart failure: Secondary | ICD-10-CM | POA: Diagnosis not present

## 2018-06-16 DIAGNOSIS — Z9981 Dependence on supplemental oxygen: Secondary | ICD-10-CM | POA: Diagnosis not present

## 2018-06-19 ENCOUNTER — Ambulatory Visit: Payer: Non-veteran care | Admitting: Cardiology

## 2018-06-19 DIAGNOSIS — I4892 Unspecified atrial flutter: Secondary | ICD-10-CM | POA: Diagnosis not present

## 2018-06-19 DIAGNOSIS — Z9981 Dependence on supplemental oxygen: Secondary | ICD-10-CM | POA: Diagnosis not present

## 2018-06-19 DIAGNOSIS — I5042 Chronic combined systolic (congestive) and diastolic (congestive) heart failure: Secondary | ICD-10-CM | POA: Diagnosis not present

## 2018-06-19 DIAGNOSIS — K222 Esophageal obstruction: Secondary | ICD-10-CM | POA: Diagnosis not present

## 2018-06-19 DIAGNOSIS — R1310 Dysphagia, unspecified: Secondary | ICD-10-CM | POA: Diagnosis not present

## 2018-06-19 DIAGNOSIS — I110 Hypertensive heart disease with heart failure: Secondary | ICD-10-CM | POA: Diagnosis not present

## 2018-06-23 DIAGNOSIS — I4892 Unspecified atrial flutter: Secondary | ICD-10-CM | POA: Diagnosis not present

## 2018-06-23 DIAGNOSIS — I110 Hypertensive heart disease with heart failure: Secondary | ICD-10-CM | POA: Diagnosis not present

## 2018-06-23 DIAGNOSIS — Z9981 Dependence on supplemental oxygen: Secondary | ICD-10-CM | POA: Diagnosis not present

## 2018-06-23 DIAGNOSIS — I5042 Chronic combined systolic (congestive) and diastolic (congestive) heart failure: Secondary | ICD-10-CM | POA: Diagnosis not present

## 2018-06-23 DIAGNOSIS — R1310 Dysphagia, unspecified: Secondary | ICD-10-CM | POA: Diagnosis not present

## 2018-06-23 DIAGNOSIS — K222 Esophageal obstruction: Secondary | ICD-10-CM | POA: Diagnosis not present

## 2018-06-26 DIAGNOSIS — F0390 Unspecified dementia without behavioral disturbance: Secondary | ICD-10-CM | POA: Diagnosis not present

## 2018-06-26 DIAGNOSIS — G40909 Epilepsy, unspecified, not intractable, without status epilepticus: Secondary | ICD-10-CM | POA: Diagnosis not present

## 2018-06-26 DIAGNOSIS — I5042 Chronic combined systolic (congestive) and diastolic (congestive) heart failure: Secondary | ICD-10-CM | POA: Diagnosis not present

## 2018-06-26 DIAGNOSIS — Z77090 Contact with and (suspected) exposure to asbestos: Secondary | ICD-10-CM | POA: Diagnosis not present

## 2018-06-26 DIAGNOSIS — K222 Esophageal obstruction: Secondary | ICD-10-CM | POA: Diagnosis not present

## 2018-06-26 DIAGNOSIS — Z86711 Personal history of pulmonary embolism: Secondary | ICD-10-CM | POA: Diagnosis not present

## 2018-06-26 DIAGNOSIS — I2510 Atherosclerotic heart disease of native coronary artery without angina pectoris: Secondary | ICD-10-CM | POA: Diagnosis not present

## 2018-06-26 DIAGNOSIS — Z9981 Dependence on supplemental oxygen: Secondary | ICD-10-CM | POA: Diagnosis not present

## 2018-06-26 DIAGNOSIS — J449 Chronic obstructive pulmonary disease, unspecified: Secondary | ICD-10-CM | POA: Diagnosis not present

## 2018-06-26 DIAGNOSIS — I4892 Unspecified atrial flutter: Secondary | ICD-10-CM | POA: Diagnosis not present

## 2018-06-26 DIAGNOSIS — G4733 Obstructive sleep apnea (adult) (pediatric): Secondary | ICD-10-CM | POA: Diagnosis not present

## 2018-06-26 DIAGNOSIS — I110 Hypertensive heart disease with heart failure: Secondary | ICD-10-CM | POA: Diagnosis not present

## 2018-06-26 DIAGNOSIS — R1310 Dysphagia, unspecified: Secondary | ICD-10-CM | POA: Diagnosis not present

## 2018-06-26 DIAGNOSIS — Z951 Presence of aortocoronary bypass graft: Secondary | ICD-10-CM | POA: Diagnosis not present

## 2018-06-26 DIAGNOSIS — Z77098 Contact with and (suspected) exposure to other hazardous, chiefly nonmedicinal, chemicals: Secondary | ICD-10-CM | POA: Diagnosis not present

## 2018-06-27 DIAGNOSIS — K222 Esophageal obstruction: Secondary | ICD-10-CM | POA: Diagnosis not present

## 2018-06-27 DIAGNOSIS — I5042 Chronic combined systolic (congestive) and diastolic (congestive) heart failure: Secondary | ICD-10-CM | POA: Diagnosis not present

## 2018-06-27 DIAGNOSIS — I110 Hypertensive heart disease with heart failure: Secondary | ICD-10-CM | POA: Diagnosis not present

## 2018-06-27 DIAGNOSIS — Z9981 Dependence on supplemental oxygen: Secondary | ICD-10-CM | POA: Diagnosis not present

## 2018-06-27 DIAGNOSIS — R1310 Dysphagia, unspecified: Secondary | ICD-10-CM | POA: Diagnosis not present

## 2018-06-27 DIAGNOSIS — I4892 Unspecified atrial flutter: Secondary | ICD-10-CM | POA: Diagnosis not present

## 2018-06-29 DIAGNOSIS — I5042 Chronic combined systolic (congestive) and diastolic (congestive) heart failure: Secondary | ICD-10-CM | POA: Diagnosis not present

## 2018-06-29 DIAGNOSIS — R1310 Dysphagia, unspecified: Secondary | ICD-10-CM | POA: Diagnosis not present

## 2018-06-29 DIAGNOSIS — I110 Hypertensive heart disease with heart failure: Secondary | ICD-10-CM | POA: Diagnosis not present

## 2018-06-29 DIAGNOSIS — I4892 Unspecified atrial flutter: Secondary | ICD-10-CM | POA: Diagnosis not present

## 2018-06-29 DIAGNOSIS — Z9981 Dependence on supplemental oxygen: Secondary | ICD-10-CM | POA: Diagnosis not present

## 2018-06-29 DIAGNOSIS — K222 Esophageal obstruction: Secondary | ICD-10-CM | POA: Diagnosis not present

## 2018-06-30 DIAGNOSIS — R1310 Dysphagia, unspecified: Secondary | ICD-10-CM | POA: Diagnosis not present

## 2018-06-30 DIAGNOSIS — K222 Esophageal obstruction: Secondary | ICD-10-CM | POA: Diagnosis not present

## 2018-06-30 DIAGNOSIS — I5042 Chronic combined systolic (congestive) and diastolic (congestive) heart failure: Secondary | ICD-10-CM | POA: Diagnosis not present

## 2018-06-30 DIAGNOSIS — Z9981 Dependence on supplemental oxygen: Secondary | ICD-10-CM | POA: Diagnosis not present

## 2018-06-30 DIAGNOSIS — I110 Hypertensive heart disease with heart failure: Secondary | ICD-10-CM | POA: Diagnosis not present

## 2018-06-30 DIAGNOSIS — I4892 Unspecified atrial flutter: Secondary | ICD-10-CM | POA: Diagnosis not present

## 2018-07-06 DIAGNOSIS — I2510 Atherosclerotic heart disease of native coronary artery without angina pectoris: Secondary | ICD-10-CM | POA: Diagnosis not present

## 2018-07-06 DIAGNOSIS — B372 Candidiasis of skin and nail: Secondary | ICD-10-CM | POA: Diagnosis not present

## 2018-07-06 DIAGNOSIS — I639 Cerebral infarction, unspecified: Secondary | ICD-10-CM | POA: Diagnosis not present

## 2018-07-06 DIAGNOSIS — F0390 Unspecified dementia without behavioral disturbance: Secondary | ICD-10-CM | POA: Diagnosis not present

## 2018-07-07 DIAGNOSIS — R1310 Dysphagia, unspecified: Secondary | ICD-10-CM | POA: Diagnosis not present

## 2018-07-07 DIAGNOSIS — K222 Esophageal obstruction: Secondary | ICD-10-CM | POA: Diagnosis not present

## 2018-07-07 DIAGNOSIS — I4892 Unspecified atrial flutter: Secondary | ICD-10-CM | POA: Diagnosis not present

## 2018-07-07 DIAGNOSIS — Z9981 Dependence on supplemental oxygen: Secondary | ICD-10-CM | POA: Diagnosis not present

## 2018-07-07 DIAGNOSIS — I5042 Chronic combined systolic (congestive) and diastolic (congestive) heart failure: Secondary | ICD-10-CM | POA: Diagnosis not present

## 2018-07-07 DIAGNOSIS — I110 Hypertensive heart disease with heart failure: Secondary | ICD-10-CM | POA: Diagnosis not present

## 2018-07-10 DIAGNOSIS — G4700 Insomnia, unspecified: Secondary | ICD-10-CM | POA: Diagnosis not present

## 2018-07-10 DIAGNOSIS — F329 Major depressive disorder, single episode, unspecified: Secondary | ICD-10-CM | POA: Diagnosis not present

## 2018-07-10 DIAGNOSIS — F0390 Unspecified dementia without behavioral disturbance: Secondary | ICD-10-CM | POA: Diagnosis not present

## 2018-07-10 DIAGNOSIS — B372 Candidiasis of skin and nail: Secondary | ICD-10-CM | POA: Diagnosis not present

## 2018-07-14 DIAGNOSIS — I4892 Unspecified atrial flutter: Secondary | ICD-10-CM | POA: Diagnosis not present

## 2018-07-14 DIAGNOSIS — Z9981 Dependence on supplemental oxygen: Secondary | ICD-10-CM | POA: Diagnosis not present

## 2018-07-14 DIAGNOSIS — I5042 Chronic combined systolic (congestive) and diastolic (congestive) heart failure: Secondary | ICD-10-CM | POA: Diagnosis not present

## 2018-07-14 DIAGNOSIS — K222 Esophageal obstruction: Secondary | ICD-10-CM | POA: Diagnosis not present

## 2018-07-14 DIAGNOSIS — R1310 Dysphagia, unspecified: Secondary | ICD-10-CM | POA: Diagnosis not present

## 2018-07-14 DIAGNOSIS — I110 Hypertensive heart disease with heart failure: Secondary | ICD-10-CM | POA: Diagnosis not present

## 2018-07-21 DIAGNOSIS — K222 Esophageal obstruction: Secondary | ICD-10-CM | POA: Diagnosis not present

## 2018-07-21 DIAGNOSIS — I4892 Unspecified atrial flutter: Secondary | ICD-10-CM | POA: Diagnosis not present

## 2018-07-21 DIAGNOSIS — I110 Hypertensive heart disease with heart failure: Secondary | ICD-10-CM | POA: Diagnosis not present

## 2018-07-21 DIAGNOSIS — Z9981 Dependence on supplemental oxygen: Secondary | ICD-10-CM | POA: Diagnosis not present

## 2018-07-21 DIAGNOSIS — R1310 Dysphagia, unspecified: Secondary | ICD-10-CM | POA: Diagnosis not present

## 2018-07-21 DIAGNOSIS — I5042 Chronic combined systolic (congestive) and diastolic (congestive) heart failure: Secondary | ICD-10-CM | POA: Diagnosis not present

## 2018-07-26 DIAGNOSIS — Z77098 Contact with and (suspected) exposure to other hazardous, chiefly nonmedicinal, chemicals: Secondary | ICD-10-CM | POA: Diagnosis not present

## 2018-07-26 DIAGNOSIS — G40909 Epilepsy, unspecified, not intractable, without status epilepticus: Secondary | ICD-10-CM | POA: Diagnosis not present

## 2018-07-26 DIAGNOSIS — K222 Esophageal obstruction: Secondary | ICD-10-CM | POA: Diagnosis not present

## 2018-07-26 DIAGNOSIS — I110 Hypertensive heart disease with heart failure: Secondary | ICD-10-CM | POA: Diagnosis not present

## 2018-07-26 DIAGNOSIS — F0390 Unspecified dementia without behavioral disturbance: Secondary | ICD-10-CM | POA: Diagnosis not present

## 2018-07-26 DIAGNOSIS — Z86711 Personal history of pulmonary embolism: Secondary | ICD-10-CM | POA: Diagnosis not present

## 2018-07-26 DIAGNOSIS — G4733 Obstructive sleep apnea (adult) (pediatric): Secondary | ICD-10-CM | POA: Diagnosis not present

## 2018-07-26 DIAGNOSIS — I2510 Atherosclerotic heart disease of native coronary artery without angina pectoris: Secondary | ICD-10-CM | POA: Diagnosis not present

## 2018-07-26 DIAGNOSIS — Z951 Presence of aortocoronary bypass graft: Secondary | ICD-10-CM | POA: Diagnosis not present

## 2018-07-26 DIAGNOSIS — I4892 Unspecified atrial flutter: Secondary | ICD-10-CM | POA: Diagnosis not present

## 2018-07-26 DIAGNOSIS — J449 Chronic obstructive pulmonary disease, unspecified: Secondary | ICD-10-CM | POA: Diagnosis not present

## 2018-07-26 DIAGNOSIS — Z77090 Contact with and (suspected) exposure to asbestos: Secondary | ICD-10-CM | POA: Diagnosis not present

## 2018-07-26 DIAGNOSIS — Z9981 Dependence on supplemental oxygen: Secondary | ICD-10-CM | POA: Diagnosis not present

## 2018-07-26 DIAGNOSIS — I5042 Chronic combined systolic (congestive) and diastolic (congestive) heart failure: Secondary | ICD-10-CM | POA: Diagnosis not present

## 2018-07-26 DIAGNOSIS — R1310 Dysphagia, unspecified: Secondary | ICD-10-CM | POA: Diagnosis not present

## 2018-07-28 DIAGNOSIS — I5042 Chronic combined systolic (congestive) and diastolic (congestive) heart failure: Secondary | ICD-10-CM | POA: Diagnosis not present

## 2018-07-28 DIAGNOSIS — Z9981 Dependence on supplemental oxygen: Secondary | ICD-10-CM | POA: Diagnosis not present

## 2018-07-28 DIAGNOSIS — I110 Hypertensive heart disease with heart failure: Secondary | ICD-10-CM | POA: Diagnosis not present

## 2018-07-28 DIAGNOSIS — K222 Esophageal obstruction: Secondary | ICD-10-CM | POA: Diagnosis not present

## 2018-07-28 DIAGNOSIS — R1310 Dysphagia, unspecified: Secondary | ICD-10-CM | POA: Diagnosis not present

## 2018-07-28 DIAGNOSIS — I4892 Unspecified atrial flutter: Secondary | ICD-10-CM | POA: Diagnosis not present

## 2018-07-29 DIAGNOSIS — Z9981 Dependence on supplemental oxygen: Secondary | ICD-10-CM | POA: Diagnosis not present

## 2018-07-29 DIAGNOSIS — I110 Hypertensive heart disease with heart failure: Secondary | ICD-10-CM | POA: Diagnosis not present

## 2018-07-29 DIAGNOSIS — R1310 Dysphagia, unspecified: Secondary | ICD-10-CM | POA: Diagnosis not present

## 2018-07-29 DIAGNOSIS — I5042 Chronic combined systolic (congestive) and diastolic (congestive) heart failure: Secondary | ICD-10-CM | POA: Diagnosis not present

## 2018-07-29 DIAGNOSIS — I4892 Unspecified atrial flutter: Secondary | ICD-10-CM | POA: Diagnosis not present

## 2018-07-29 DIAGNOSIS — K222 Esophageal obstruction: Secondary | ICD-10-CM | POA: Diagnosis not present

## 2018-08-04 DIAGNOSIS — Z9981 Dependence on supplemental oxygen: Secondary | ICD-10-CM | POA: Diagnosis not present

## 2018-08-04 DIAGNOSIS — K222 Esophageal obstruction: Secondary | ICD-10-CM | POA: Diagnosis not present

## 2018-08-04 DIAGNOSIS — R1310 Dysphagia, unspecified: Secondary | ICD-10-CM | POA: Diagnosis not present

## 2018-08-04 DIAGNOSIS — I5042 Chronic combined systolic (congestive) and diastolic (congestive) heart failure: Secondary | ICD-10-CM | POA: Diagnosis not present

## 2018-08-04 DIAGNOSIS — I4892 Unspecified atrial flutter: Secondary | ICD-10-CM | POA: Diagnosis not present

## 2018-08-04 DIAGNOSIS — I110 Hypertensive heart disease with heart failure: Secondary | ICD-10-CM | POA: Diagnosis not present

## 2018-08-11 DIAGNOSIS — R1310 Dysphagia, unspecified: Secondary | ICD-10-CM | POA: Diagnosis not present

## 2018-08-11 DIAGNOSIS — K222 Esophageal obstruction: Secondary | ICD-10-CM | POA: Diagnosis not present

## 2018-08-11 DIAGNOSIS — I110 Hypertensive heart disease with heart failure: Secondary | ICD-10-CM | POA: Diagnosis not present

## 2018-08-11 DIAGNOSIS — I5042 Chronic combined systolic (congestive) and diastolic (congestive) heart failure: Secondary | ICD-10-CM | POA: Diagnosis not present

## 2018-08-11 DIAGNOSIS — I4892 Unspecified atrial flutter: Secondary | ICD-10-CM | POA: Diagnosis not present

## 2018-08-11 DIAGNOSIS — Z9981 Dependence on supplemental oxygen: Secondary | ICD-10-CM | POA: Diagnosis not present

## 2018-08-17 DIAGNOSIS — K222 Esophageal obstruction: Secondary | ICD-10-CM | POA: Diagnosis not present

## 2018-08-17 DIAGNOSIS — I110 Hypertensive heart disease with heart failure: Secondary | ICD-10-CM | POA: Diagnosis not present

## 2018-08-17 DIAGNOSIS — Z9981 Dependence on supplemental oxygen: Secondary | ICD-10-CM | POA: Diagnosis not present

## 2018-08-17 DIAGNOSIS — I5042 Chronic combined systolic (congestive) and diastolic (congestive) heart failure: Secondary | ICD-10-CM | POA: Diagnosis not present

## 2018-08-17 DIAGNOSIS — R1310 Dysphagia, unspecified: Secondary | ICD-10-CM | POA: Diagnosis not present

## 2018-08-17 DIAGNOSIS — I4892 Unspecified atrial flutter: Secondary | ICD-10-CM | POA: Diagnosis not present

## 2018-08-18 DIAGNOSIS — Z9981 Dependence on supplemental oxygen: Secondary | ICD-10-CM | POA: Diagnosis not present

## 2018-08-18 DIAGNOSIS — K222 Esophageal obstruction: Secondary | ICD-10-CM | POA: Diagnosis not present

## 2018-08-18 DIAGNOSIS — R1310 Dysphagia, unspecified: Secondary | ICD-10-CM | POA: Diagnosis not present

## 2018-08-18 DIAGNOSIS — I4892 Unspecified atrial flutter: Secondary | ICD-10-CM | POA: Diagnosis not present

## 2018-08-18 DIAGNOSIS — I110 Hypertensive heart disease with heart failure: Secondary | ICD-10-CM | POA: Diagnosis not present

## 2018-08-18 DIAGNOSIS — I5042 Chronic combined systolic (congestive) and diastolic (congestive) heart failure: Secondary | ICD-10-CM | POA: Diagnosis not present

## 2018-08-25 DIAGNOSIS — I4892 Unspecified atrial flutter: Secondary | ICD-10-CM | POA: Diagnosis not present

## 2018-08-25 DIAGNOSIS — K222 Esophageal obstruction: Secondary | ICD-10-CM | POA: Diagnosis not present

## 2018-08-25 DIAGNOSIS — I110 Hypertensive heart disease with heart failure: Secondary | ICD-10-CM | POA: Diagnosis not present

## 2018-08-25 DIAGNOSIS — R1310 Dysphagia, unspecified: Secondary | ICD-10-CM | POA: Diagnosis not present

## 2018-08-25 DIAGNOSIS — Z9981 Dependence on supplemental oxygen: Secondary | ICD-10-CM | POA: Diagnosis not present

## 2018-08-25 DIAGNOSIS — I5042 Chronic combined systolic (congestive) and diastolic (congestive) heart failure: Secondary | ICD-10-CM | POA: Diagnosis not present

## 2018-08-26 DIAGNOSIS — Z77090 Contact with and (suspected) exposure to asbestos: Secondary | ICD-10-CM | POA: Diagnosis not present

## 2018-08-26 DIAGNOSIS — G40909 Epilepsy, unspecified, not intractable, without status epilepticus: Secondary | ICD-10-CM | POA: Diagnosis not present

## 2018-08-26 DIAGNOSIS — F0390 Unspecified dementia without behavioral disturbance: Secondary | ICD-10-CM | POA: Diagnosis not present

## 2018-08-26 DIAGNOSIS — Z77098 Contact with and (suspected) exposure to other hazardous, chiefly nonmedicinal, chemicals: Secondary | ICD-10-CM | POA: Diagnosis not present

## 2018-08-26 DIAGNOSIS — I2510 Atherosclerotic heart disease of native coronary artery without angina pectoris: Secondary | ICD-10-CM | POA: Diagnosis not present

## 2018-08-26 DIAGNOSIS — I4892 Unspecified atrial flutter: Secondary | ICD-10-CM | POA: Diagnosis not present

## 2018-08-26 DIAGNOSIS — I5042 Chronic combined systolic (congestive) and diastolic (congestive) heart failure: Secondary | ICD-10-CM | POA: Diagnosis not present

## 2018-08-26 DIAGNOSIS — G4733 Obstructive sleep apnea (adult) (pediatric): Secondary | ICD-10-CM | POA: Diagnosis not present

## 2018-08-26 DIAGNOSIS — I110 Hypertensive heart disease with heart failure: Secondary | ICD-10-CM | POA: Diagnosis not present

## 2018-08-26 DIAGNOSIS — J449 Chronic obstructive pulmonary disease, unspecified: Secondary | ICD-10-CM | POA: Diagnosis not present

## 2018-08-26 DIAGNOSIS — Z86711 Personal history of pulmonary embolism: Secondary | ICD-10-CM | POA: Diagnosis not present

## 2018-08-26 DIAGNOSIS — Z9981 Dependence on supplemental oxygen: Secondary | ICD-10-CM | POA: Diagnosis not present

## 2018-08-26 DIAGNOSIS — R1310 Dysphagia, unspecified: Secondary | ICD-10-CM | POA: Diagnosis not present

## 2018-08-26 DIAGNOSIS — K222 Esophageal obstruction: Secondary | ICD-10-CM | POA: Diagnosis not present

## 2018-08-26 DIAGNOSIS — Z951 Presence of aortocoronary bypass graft: Secondary | ICD-10-CM | POA: Diagnosis not present

## 2018-08-29 DIAGNOSIS — Z9981 Dependence on supplemental oxygen: Secondary | ICD-10-CM | POA: Diagnosis not present

## 2018-08-29 DIAGNOSIS — I5042 Chronic combined systolic (congestive) and diastolic (congestive) heart failure: Secondary | ICD-10-CM | POA: Diagnosis not present

## 2018-08-29 DIAGNOSIS — R1310 Dysphagia, unspecified: Secondary | ICD-10-CM | POA: Diagnosis not present

## 2018-08-29 DIAGNOSIS — K222 Esophageal obstruction: Secondary | ICD-10-CM | POA: Diagnosis not present

## 2018-08-29 DIAGNOSIS — I110 Hypertensive heart disease with heart failure: Secondary | ICD-10-CM | POA: Diagnosis not present

## 2018-08-29 DIAGNOSIS — I4892 Unspecified atrial flutter: Secondary | ICD-10-CM | POA: Diagnosis not present

## 2018-09-01 DIAGNOSIS — I5042 Chronic combined systolic (congestive) and diastolic (congestive) heart failure: Secondary | ICD-10-CM | POA: Diagnosis not present

## 2018-09-01 DIAGNOSIS — K222 Esophageal obstruction: Secondary | ICD-10-CM | POA: Diagnosis not present

## 2018-09-01 DIAGNOSIS — I4892 Unspecified atrial flutter: Secondary | ICD-10-CM | POA: Diagnosis not present

## 2018-09-01 DIAGNOSIS — R1310 Dysphagia, unspecified: Secondary | ICD-10-CM | POA: Diagnosis not present

## 2018-09-01 DIAGNOSIS — Z9981 Dependence on supplemental oxygen: Secondary | ICD-10-CM | POA: Diagnosis not present

## 2018-09-01 DIAGNOSIS — I110 Hypertensive heart disease with heart failure: Secondary | ICD-10-CM | POA: Diagnosis not present

## 2018-09-08 DIAGNOSIS — I110 Hypertensive heart disease with heart failure: Secondary | ICD-10-CM | POA: Diagnosis not present

## 2018-09-08 DIAGNOSIS — Z9981 Dependence on supplemental oxygen: Secondary | ICD-10-CM | POA: Diagnosis not present

## 2018-09-08 DIAGNOSIS — K222 Esophageal obstruction: Secondary | ICD-10-CM | POA: Diagnosis not present

## 2018-09-08 DIAGNOSIS — I5042 Chronic combined systolic (congestive) and diastolic (congestive) heart failure: Secondary | ICD-10-CM | POA: Diagnosis not present

## 2018-09-08 DIAGNOSIS — I4892 Unspecified atrial flutter: Secondary | ICD-10-CM | POA: Diagnosis not present

## 2018-09-08 DIAGNOSIS — R1310 Dysphagia, unspecified: Secondary | ICD-10-CM | POA: Diagnosis not present

## 2018-09-15 DIAGNOSIS — I5042 Chronic combined systolic (congestive) and diastolic (congestive) heart failure: Secondary | ICD-10-CM | POA: Diagnosis not present

## 2018-09-15 DIAGNOSIS — R1310 Dysphagia, unspecified: Secondary | ICD-10-CM | POA: Diagnosis not present

## 2018-09-15 DIAGNOSIS — I4892 Unspecified atrial flutter: Secondary | ICD-10-CM | POA: Diagnosis not present

## 2018-09-15 DIAGNOSIS — K222 Esophageal obstruction: Secondary | ICD-10-CM | POA: Diagnosis not present

## 2018-09-15 DIAGNOSIS — I110 Hypertensive heart disease with heart failure: Secondary | ICD-10-CM | POA: Diagnosis not present

## 2018-09-15 DIAGNOSIS — Z9981 Dependence on supplemental oxygen: Secondary | ICD-10-CM | POA: Diagnosis not present

## 2018-09-18 DIAGNOSIS — I509 Heart failure, unspecified: Secondary | ICD-10-CM | POA: Diagnosis not present

## 2018-09-18 DIAGNOSIS — F0391 Unspecified dementia with behavioral disturbance: Secondary | ICD-10-CM | POA: Diagnosis not present

## 2018-09-18 DIAGNOSIS — I4891 Unspecified atrial fibrillation: Secondary | ICD-10-CM | POA: Diagnosis not present

## 2018-09-18 DIAGNOSIS — F339 Major depressive disorder, recurrent, unspecified: Secondary | ICD-10-CM | POA: Diagnosis not present

## 2018-09-22 DIAGNOSIS — Z9981 Dependence on supplemental oxygen: Secondary | ICD-10-CM | POA: Diagnosis not present

## 2018-09-22 DIAGNOSIS — K222 Esophageal obstruction: Secondary | ICD-10-CM | POA: Diagnosis not present

## 2018-09-22 DIAGNOSIS — I110 Hypertensive heart disease with heart failure: Secondary | ICD-10-CM | POA: Diagnosis not present

## 2018-09-22 DIAGNOSIS — R1310 Dysphagia, unspecified: Secondary | ICD-10-CM | POA: Diagnosis not present

## 2018-09-22 DIAGNOSIS — I4892 Unspecified atrial flutter: Secondary | ICD-10-CM | POA: Diagnosis not present

## 2018-09-22 DIAGNOSIS — I5042 Chronic combined systolic (congestive) and diastolic (congestive) heart failure: Secondary | ICD-10-CM | POA: Diagnosis not present

## 2018-09-26 DIAGNOSIS — I2510 Atherosclerotic heart disease of native coronary artery without angina pectoris: Secondary | ICD-10-CM | POA: Diagnosis not present

## 2018-09-26 DIAGNOSIS — R1310 Dysphagia, unspecified: Secondary | ICD-10-CM | POA: Diagnosis not present

## 2018-09-26 DIAGNOSIS — Z951 Presence of aortocoronary bypass graft: Secondary | ICD-10-CM | POA: Diagnosis not present

## 2018-09-26 DIAGNOSIS — Z86711 Personal history of pulmonary embolism: Secondary | ICD-10-CM | POA: Diagnosis not present

## 2018-09-26 DIAGNOSIS — Z77090 Contact with and (suspected) exposure to asbestos: Secondary | ICD-10-CM | POA: Diagnosis not present

## 2018-09-26 DIAGNOSIS — I4892 Unspecified atrial flutter: Secondary | ICD-10-CM | POA: Diagnosis not present

## 2018-09-26 DIAGNOSIS — Z77098 Contact with and (suspected) exposure to other hazardous, chiefly nonmedicinal, chemicals: Secondary | ICD-10-CM | POA: Diagnosis not present

## 2018-09-26 DIAGNOSIS — I110 Hypertensive heart disease with heart failure: Secondary | ICD-10-CM | POA: Diagnosis not present

## 2018-09-26 DIAGNOSIS — I5042 Chronic combined systolic (congestive) and diastolic (congestive) heart failure: Secondary | ICD-10-CM | POA: Diagnosis not present

## 2018-09-26 DIAGNOSIS — K222 Esophageal obstruction: Secondary | ICD-10-CM | POA: Diagnosis not present

## 2018-09-26 DIAGNOSIS — J449 Chronic obstructive pulmonary disease, unspecified: Secondary | ICD-10-CM | POA: Diagnosis not present

## 2018-09-26 DIAGNOSIS — F0390 Unspecified dementia without behavioral disturbance: Secondary | ICD-10-CM | POA: Diagnosis not present

## 2018-09-26 DIAGNOSIS — Z9981 Dependence on supplemental oxygen: Secondary | ICD-10-CM | POA: Diagnosis not present

## 2018-09-26 DIAGNOSIS — G4733 Obstructive sleep apnea (adult) (pediatric): Secondary | ICD-10-CM | POA: Diagnosis not present

## 2018-09-26 DIAGNOSIS — G40909 Epilepsy, unspecified, not intractable, without status epilepticus: Secondary | ICD-10-CM | POA: Diagnosis not present

## 2018-09-29 DIAGNOSIS — R1310 Dysphagia, unspecified: Secondary | ICD-10-CM | POA: Diagnosis not present

## 2018-09-29 DIAGNOSIS — K222 Esophageal obstruction: Secondary | ICD-10-CM | POA: Diagnosis not present

## 2018-09-29 DIAGNOSIS — Z9981 Dependence on supplemental oxygen: Secondary | ICD-10-CM | POA: Diagnosis not present

## 2018-09-29 DIAGNOSIS — I5042 Chronic combined systolic (congestive) and diastolic (congestive) heart failure: Secondary | ICD-10-CM | POA: Diagnosis not present

## 2018-09-29 DIAGNOSIS — I4892 Unspecified atrial flutter: Secondary | ICD-10-CM | POA: Diagnosis not present

## 2018-09-29 DIAGNOSIS — I110 Hypertensive heart disease with heart failure: Secondary | ICD-10-CM | POA: Diagnosis not present

## 2018-10-06 DIAGNOSIS — R1310 Dysphagia, unspecified: Secondary | ICD-10-CM | POA: Diagnosis not present

## 2018-10-06 DIAGNOSIS — Z9981 Dependence on supplemental oxygen: Secondary | ICD-10-CM | POA: Diagnosis not present

## 2018-10-06 DIAGNOSIS — K222 Esophageal obstruction: Secondary | ICD-10-CM | POA: Diagnosis not present

## 2018-10-06 DIAGNOSIS — I110 Hypertensive heart disease with heart failure: Secondary | ICD-10-CM | POA: Diagnosis not present

## 2018-10-06 DIAGNOSIS — I5042 Chronic combined systolic (congestive) and diastolic (congestive) heart failure: Secondary | ICD-10-CM | POA: Diagnosis not present

## 2018-10-06 DIAGNOSIS — I4892 Unspecified atrial flutter: Secondary | ICD-10-CM | POA: Diagnosis not present

## 2018-10-13 DIAGNOSIS — I4892 Unspecified atrial flutter: Secondary | ICD-10-CM | POA: Diagnosis not present

## 2018-10-13 DIAGNOSIS — I110 Hypertensive heart disease with heart failure: Secondary | ICD-10-CM | POA: Diagnosis not present

## 2018-10-13 DIAGNOSIS — R1310 Dysphagia, unspecified: Secondary | ICD-10-CM | POA: Diagnosis not present

## 2018-10-13 DIAGNOSIS — I5042 Chronic combined systolic (congestive) and diastolic (congestive) heart failure: Secondary | ICD-10-CM | POA: Diagnosis not present

## 2018-10-13 DIAGNOSIS — Z9981 Dependence on supplemental oxygen: Secondary | ICD-10-CM | POA: Diagnosis not present

## 2018-10-13 DIAGNOSIS — K222 Esophageal obstruction: Secondary | ICD-10-CM | POA: Diagnosis not present

## 2018-10-20 DIAGNOSIS — R1310 Dysphagia, unspecified: Secondary | ICD-10-CM | POA: Diagnosis not present

## 2018-10-20 DIAGNOSIS — I110 Hypertensive heart disease with heart failure: Secondary | ICD-10-CM | POA: Diagnosis not present

## 2018-10-20 DIAGNOSIS — K222 Esophageal obstruction: Secondary | ICD-10-CM | POA: Diagnosis not present

## 2018-10-20 DIAGNOSIS — I4892 Unspecified atrial flutter: Secondary | ICD-10-CM | POA: Diagnosis not present

## 2018-10-20 DIAGNOSIS — I5042 Chronic combined systolic (congestive) and diastolic (congestive) heart failure: Secondary | ICD-10-CM | POA: Diagnosis not present

## 2018-10-20 DIAGNOSIS — Z9981 Dependence on supplemental oxygen: Secondary | ICD-10-CM | POA: Diagnosis not present

## 2018-10-25 DIAGNOSIS — I110 Hypertensive heart disease with heart failure: Secondary | ICD-10-CM | POA: Diagnosis not present

## 2018-10-25 DIAGNOSIS — I2510 Atherosclerotic heart disease of native coronary artery without angina pectoris: Secondary | ICD-10-CM | POA: Diagnosis not present

## 2018-10-25 DIAGNOSIS — J449 Chronic obstructive pulmonary disease, unspecified: Secondary | ICD-10-CM | POA: Diagnosis not present

## 2018-10-25 DIAGNOSIS — G4733 Obstructive sleep apnea (adult) (pediatric): Secondary | ICD-10-CM | POA: Diagnosis not present

## 2018-10-25 DIAGNOSIS — Z77098 Contact with and (suspected) exposure to other hazardous, chiefly nonmedicinal, chemicals: Secondary | ICD-10-CM | POA: Diagnosis not present

## 2018-10-25 DIAGNOSIS — Z9981 Dependence on supplemental oxygen: Secondary | ICD-10-CM | POA: Diagnosis not present

## 2018-10-25 DIAGNOSIS — G40909 Epilepsy, unspecified, not intractable, without status epilepticus: Secondary | ICD-10-CM | POA: Diagnosis not present

## 2018-10-25 DIAGNOSIS — F0390 Unspecified dementia without behavioral disturbance: Secondary | ICD-10-CM | POA: Diagnosis not present

## 2018-10-25 DIAGNOSIS — I5042 Chronic combined systolic (congestive) and diastolic (congestive) heart failure: Secondary | ICD-10-CM | POA: Diagnosis not present

## 2018-10-25 DIAGNOSIS — Z77090 Contact with and (suspected) exposure to asbestos: Secondary | ICD-10-CM | POA: Diagnosis not present

## 2018-10-25 DIAGNOSIS — Z86711 Personal history of pulmonary embolism: Secondary | ICD-10-CM | POA: Diagnosis not present

## 2018-10-25 DIAGNOSIS — Z951 Presence of aortocoronary bypass graft: Secondary | ICD-10-CM | POA: Diagnosis not present

## 2018-10-25 DIAGNOSIS — K222 Esophageal obstruction: Secondary | ICD-10-CM | POA: Diagnosis not present

## 2018-10-25 DIAGNOSIS — R1310 Dysphagia, unspecified: Secondary | ICD-10-CM | POA: Diagnosis not present

## 2018-10-25 DIAGNOSIS — I4892 Unspecified atrial flutter: Secondary | ICD-10-CM | POA: Diagnosis not present

## 2018-10-26 DIAGNOSIS — K222 Esophageal obstruction: Secondary | ICD-10-CM | POA: Diagnosis not present

## 2018-10-26 DIAGNOSIS — I110 Hypertensive heart disease with heart failure: Secondary | ICD-10-CM | POA: Diagnosis not present

## 2018-10-26 DIAGNOSIS — Z9981 Dependence on supplemental oxygen: Secondary | ICD-10-CM | POA: Diagnosis not present

## 2018-10-26 DIAGNOSIS — I5042 Chronic combined systolic (congestive) and diastolic (congestive) heart failure: Secondary | ICD-10-CM | POA: Diagnosis not present

## 2018-10-26 DIAGNOSIS — I4892 Unspecified atrial flutter: Secondary | ICD-10-CM | POA: Diagnosis not present

## 2018-10-26 DIAGNOSIS — R1310 Dysphagia, unspecified: Secondary | ICD-10-CM | POA: Diagnosis not present

## 2018-10-27 DIAGNOSIS — I5042 Chronic combined systolic (congestive) and diastolic (congestive) heart failure: Secondary | ICD-10-CM | POA: Diagnosis not present

## 2018-10-27 DIAGNOSIS — I110 Hypertensive heart disease with heart failure: Secondary | ICD-10-CM | POA: Diagnosis not present

## 2018-10-27 DIAGNOSIS — K222 Esophageal obstruction: Secondary | ICD-10-CM | POA: Diagnosis not present

## 2018-10-27 DIAGNOSIS — R1310 Dysphagia, unspecified: Secondary | ICD-10-CM | POA: Diagnosis not present

## 2018-10-27 DIAGNOSIS — Z9981 Dependence on supplemental oxygen: Secondary | ICD-10-CM | POA: Diagnosis not present

## 2018-10-27 DIAGNOSIS — I4892 Unspecified atrial flutter: Secondary | ICD-10-CM | POA: Diagnosis not present

## 2018-11-03 DIAGNOSIS — E785 Hyperlipidemia, unspecified: Secondary | ICD-10-CM | POA: Diagnosis not present

## 2018-11-03 DIAGNOSIS — F419 Anxiety disorder, unspecified: Secondary | ICD-10-CM | POA: Diagnosis not present

## 2018-11-03 DIAGNOSIS — F329 Major depressive disorder, single episode, unspecified: Secondary | ICD-10-CM | POA: Diagnosis not present

## 2018-11-03 DIAGNOSIS — I2510 Atherosclerotic heart disease of native coronary artery without angina pectoris: Secondary | ICD-10-CM | POA: Diagnosis not present

## 2018-11-03 DIAGNOSIS — G4700 Insomnia, unspecified: Secondary | ICD-10-CM | POA: Diagnosis not present

## 2018-11-03 DIAGNOSIS — J449 Chronic obstructive pulmonary disease, unspecified: Secondary | ICD-10-CM | POA: Diagnosis not present

## 2018-11-03 DIAGNOSIS — I509 Heart failure, unspecified: Secondary | ICD-10-CM | POA: Diagnosis not present

## 2018-11-03 DIAGNOSIS — F0391 Unspecified dementia with behavioral disturbance: Secondary | ICD-10-CM | POA: Diagnosis not present

## 2018-11-03 DIAGNOSIS — E119 Type 2 diabetes mellitus without complications: Secondary | ICD-10-CM | POA: Diagnosis not present

## 2018-11-04 DIAGNOSIS — I5042 Chronic combined systolic (congestive) and diastolic (congestive) heart failure: Secondary | ICD-10-CM | POA: Diagnosis not present

## 2018-11-04 DIAGNOSIS — R1310 Dysphagia, unspecified: Secondary | ICD-10-CM | POA: Diagnosis not present

## 2018-11-04 DIAGNOSIS — I110 Hypertensive heart disease with heart failure: Secondary | ICD-10-CM | POA: Diagnosis not present

## 2018-11-04 DIAGNOSIS — I4892 Unspecified atrial flutter: Secondary | ICD-10-CM | POA: Diagnosis not present

## 2018-11-04 DIAGNOSIS — Z9981 Dependence on supplemental oxygen: Secondary | ICD-10-CM | POA: Diagnosis not present

## 2018-11-04 DIAGNOSIS — K222 Esophageal obstruction: Secondary | ICD-10-CM | POA: Diagnosis not present

## 2018-11-05 DIAGNOSIS — I110 Hypertensive heart disease with heart failure: Secondary | ICD-10-CM | POA: Diagnosis not present

## 2018-11-05 DIAGNOSIS — I4892 Unspecified atrial flutter: Secondary | ICD-10-CM | POA: Diagnosis not present

## 2018-11-05 DIAGNOSIS — R1310 Dysphagia, unspecified: Secondary | ICD-10-CM | POA: Diagnosis not present

## 2018-11-05 DIAGNOSIS — Z9981 Dependence on supplemental oxygen: Secondary | ICD-10-CM | POA: Diagnosis not present

## 2018-11-05 DIAGNOSIS — K222 Esophageal obstruction: Secondary | ICD-10-CM | POA: Diagnosis not present

## 2018-11-05 DIAGNOSIS — I5042 Chronic combined systolic (congestive) and diastolic (congestive) heart failure: Secondary | ICD-10-CM | POA: Diagnosis not present

## 2018-11-12 DIAGNOSIS — I5042 Chronic combined systolic (congestive) and diastolic (congestive) heart failure: Secondary | ICD-10-CM | POA: Diagnosis not present

## 2018-11-12 DIAGNOSIS — R1310 Dysphagia, unspecified: Secondary | ICD-10-CM | POA: Diagnosis not present

## 2018-11-12 DIAGNOSIS — I110 Hypertensive heart disease with heart failure: Secondary | ICD-10-CM | POA: Diagnosis not present

## 2018-11-12 DIAGNOSIS — I4892 Unspecified atrial flutter: Secondary | ICD-10-CM | POA: Diagnosis not present

## 2018-11-12 DIAGNOSIS — Z9981 Dependence on supplemental oxygen: Secondary | ICD-10-CM | POA: Diagnosis not present

## 2018-11-12 DIAGNOSIS — K222 Esophageal obstruction: Secondary | ICD-10-CM | POA: Diagnosis not present

## 2018-11-19 DIAGNOSIS — I5042 Chronic combined systolic (congestive) and diastolic (congestive) heart failure: Secondary | ICD-10-CM | POA: Diagnosis not present

## 2018-11-19 DIAGNOSIS — I110 Hypertensive heart disease with heart failure: Secondary | ICD-10-CM | POA: Diagnosis not present

## 2018-11-19 DIAGNOSIS — R1310 Dysphagia, unspecified: Secondary | ICD-10-CM | POA: Diagnosis not present

## 2018-11-19 DIAGNOSIS — K222 Esophageal obstruction: Secondary | ICD-10-CM | POA: Diagnosis not present

## 2018-11-19 DIAGNOSIS — I4892 Unspecified atrial flutter: Secondary | ICD-10-CM | POA: Diagnosis not present

## 2018-11-19 DIAGNOSIS — Z9981 Dependence on supplemental oxygen: Secondary | ICD-10-CM | POA: Diagnosis not present

## 2018-11-25 DIAGNOSIS — I110 Hypertensive heart disease with heart failure: Secondary | ICD-10-CM | POA: Diagnosis not present

## 2018-11-25 DIAGNOSIS — F0390 Unspecified dementia without behavioral disturbance: Secondary | ICD-10-CM | POA: Diagnosis not present

## 2018-11-25 DIAGNOSIS — Z77090 Contact with and (suspected) exposure to asbestos: Secondary | ICD-10-CM | POA: Diagnosis not present

## 2018-11-25 DIAGNOSIS — K222 Esophageal obstruction: Secondary | ICD-10-CM | POA: Diagnosis not present

## 2018-11-25 DIAGNOSIS — Z86711 Personal history of pulmonary embolism: Secondary | ICD-10-CM | POA: Diagnosis not present

## 2018-11-25 DIAGNOSIS — G4733 Obstructive sleep apnea (adult) (pediatric): Secondary | ICD-10-CM | POA: Diagnosis not present

## 2018-11-25 DIAGNOSIS — I2510 Atherosclerotic heart disease of native coronary artery without angina pectoris: Secondary | ICD-10-CM | POA: Diagnosis not present

## 2018-11-25 DIAGNOSIS — I4892 Unspecified atrial flutter: Secondary | ICD-10-CM | POA: Diagnosis not present

## 2018-11-25 DIAGNOSIS — Z951 Presence of aortocoronary bypass graft: Secondary | ICD-10-CM | POA: Diagnosis not present

## 2018-11-25 DIAGNOSIS — I5042 Chronic combined systolic (congestive) and diastolic (congestive) heart failure: Secondary | ICD-10-CM | POA: Diagnosis not present

## 2018-11-25 DIAGNOSIS — G40909 Epilepsy, unspecified, not intractable, without status epilepticus: Secondary | ICD-10-CM | POA: Diagnosis not present

## 2018-11-25 DIAGNOSIS — Z9981 Dependence on supplemental oxygen: Secondary | ICD-10-CM | POA: Diagnosis not present

## 2018-11-25 DIAGNOSIS — Z77098 Contact with and (suspected) exposure to other hazardous, chiefly nonmedicinal, chemicals: Secondary | ICD-10-CM | POA: Diagnosis not present

## 2018-11-25 DIAGNOSIS — J449 Chronic obstructive pulmonary disease, unspecified: Secondary | ICD-10-CM | POA: Diagnosis not present

## 2018-11-25 DIAGNOSIS — R1310 Dysphagia, unspecified: Secondary | ICD-10-CM | POA: Diagnosis not present

## 2018-11-26 DIAGNOSIS — Z9981 Dependence on supplemental oxygen: Secondary | ICD-10-CM | POA: Diagnosis not present

## 2018-11-26 DIAGNOSIS — K222 Esophageal obstruction: Secondary | ICD-10-CM | POA: Diagnosis not present

## 2018-11-26 DIAGNOSIS — I4892 Unspecified atrial flutter: Secondary | ICD-10-CM | POA: Diagnosis not present

## 2018-11-26 DIAGNOSIS — R1310 Dysphagia, unspecified: Secondary | ICD-10-CM | POA: Diagnosis not present

## 2018-11-26 DIAGNOSIS — I5042 Chronic combined systolic (congestive) and diastolic (congestive) heart failure: Secondary | ICD-10-CM | POA: Diagnosis not present

## 2018-11-26 DIAGNOSIS — I110 Hypertensive heart disease with heart failure: Secondary | ICD-10-CM | POA: Diagnosis not present

## 2018-12-14 ENCOUNTER — Encounter: Payer: Self-pay | Admitting: *Deleted

## 2018-12-14 ENCOUNTER — Other Ambulatory Visit: Payer: Self-pay | Admitting: *Deleted

## 2018-12-14 NOTE — Patient Outreach (Signed)
[facility]) Care Management  12/14/2018  [name] [birth year] [id number]  CSW was able to make initial contact with patient today at [facility] [place], [place] where patient currently resides to receive long-term care services.  CSW was also able to perform the phone assessment with patient, as well as assess and assist with social work needs and services.  CSW introduced self, explained role and types of services provided through [facility] ([facility]).  CSW further explained to patient that CSW works with patient's Primary Care Physician, also with [facility], Dr. Bernell List. CSW then explained the reason for the call, indicating that Dr. Felton Clinton thought that patient would benefit from social work services and resources to assist with counseling and supportive services for symptoms of depression.  CSW obtained two HIPAA compliant identifiers from patient, which included patient's name and date of birth.  When asked how patient was feeling today, patient reported, "Terrible".  CSW encouraged patient to elaborate but patient admitted to "not being in a very good mood today and not feeling like talking on the phone".  CSW apologized for not being able to visit patient at [facility] to have a face-to-face conversation, due to Coronavirus restrictions.  Patient voiced understanding, indicating that this is a major part of his problem, not being able to have visitors or leave the facility.  Patient verbalized that he has a long history of depression, but currently denies feeling homicidal or suicidal.  CSW tried to provide counseling and supportive services to patient today, but patient was not interested, stating, "I am be more receptive the next time we talk, but not today".  Patient was agreeable to having CSW mail him a list of counselors/therapists in [facility] that accept Medicare, and agreed to consider  counseling services.  CSW explained to patient that CSW would be following-up with patient again next week, on Monday, December 21, 2018, around 11:00AM, to ensure that he received the packet of resource information mailed directly to him at [facility] [place].  CSW further explained to patient that CSW would be more than happy to assist patient with the referral process.  Patient was most appreciative of the call and of CSW's willingness to help, apologizing for being "short".  CSW was able to confirm that patient has the correct contact information for CSW, encouraging patient to contact CSW directly if he needs additional social work services and/or resources in the meantime, or if he would just like to talk.  Patient was very much in agreement with this plan.  Nat Christen, BSW, MSW, LCSW  Licensed Education officer, environmental [facility]  Mailing [place] [address] [place], [place] [address] Physical Address-[address], [place], [place] [address] Toll Free Main # [phone] Fax # [phone] Cell # [phone]  Office # [phone] [email or web address]

## 2018-12-16 DIAGNOSIS — I110 Hypertensive heart disease with heart failure: Secondary | ICD-10-CM | POA: Diagnosis not present

## 2018-12-16 DIAGNOSIS — I5042 Chronic combined systolic (congestive) and diastolic (congestive) heart failure: Secondary | ICD-10-CM | POA: Diagnosis not present

## 2018-12-16 DIAGNOSIS — Z9981 Dependence on supplemental oxygen: Secondary | ICD-10-CM | POA: Diagnosis not present

## 2018-12-16 DIAGNOSIS — R1310 Dysphagia, unspecified: Secondary | ICD-10-CM | POA: Diagnosis not present

## 2018-12-16 DIAGNOSIS — K222 Esophageal obstruction: Secondary | ICD-10-CM | POA: Diagnosis not present

## 2018-12-16 DIAGNOSIS — I4892 Unspecified atrial flutter: Secondary | ICD-10-CM | POA: Diagnosis not present

## 2018-12-17 DIAGNOSIS — I110 Hypertensive heart disease with heart failure: Secondary | ICD-10-CM | POA: Diagnosis not present

## 2018-12-17 DIAGNOSIS — R1310 Dysphagia, unspecified: Secondary | ICD-10-CM | POA: Diagnosis not present

## 2018-12-17 DIAGNOSIS — K222 Esophageal obstruction: Secondary | ICD-10-CM | POA: Diagnosis not present

## 2018-12-17 DIAGNOSIS — I5042 Chronic combined systolic (congestive) and diastolic (congestive) heart failure: Secondary | ICD-10-CM | POA: Diagnosis not present

## 2018-12-17 DIAGNOSIS — I4892 Unspecified atrial flutter: Secondary | ICD-10-CM | POA: Diagnosis not present

## 2018-12-17 DIAGNOSIS — Z9981 Dependence on supplemental oxygen: Secondary | ICD-10-CM | POA: Diagnosis not present

## 2018-12-21 ENCOUNTER — Other Ambulatory Visit: Payer: Self-pay | Admitting: *Deleted

## 2018-12-21 ENCOUNTER — Encounter: Payer: Self-pay | Admitting: *Deleted

## 2018-12-21 NOTE — Patient Outreach (Signed)
[facility]) Care Management  12/21/2018  RAHM MINIX [birth year] [id number]   CSW was able to make contact with patient today at [facility] [place], [place] where patient currently resides to receive long-term care services, to follow-up regarding social work services and resources, as well as to ensure that patient received the packet of resource mailed to him by CSW.  Patient denied receiving the list of counselors/therapists in [facility] that accept [employer], mailed to him at [facility] [place], on Monday, December 14, 2018.  Patient requested that [facility] mail a new list directly to his wife at his home address.  CSW agreed to place the information in the mail to patient today.  Patient admitted that he did not feel like talking today, indicating that he is "hurting all over".  CSW inquired as to whether or not patient has communicated his pain to his Primary Care Physician, Dr. Bernell List.  Patient admitted that he has spoken with Dr. Felton Clinton about his pain and that he was prescribed Acetaminophen, 650MG, every 6 hours, as needed.  Patient was encouraged to follow-up with Dr. Felton Clinton to explain that the Tylenol is not relieving his pain.  CSW agreed to follow-up with patient again next week, on Monday, Dec 28, 2018, around 9:00AM to provide counseling and supportive services, as well as ensure that patient received the list of counselor's/therapists in [facility] that accept Medicare.  Nat Christen, BSW, MSW, LCSW  Licensed Education officer, environmental Health System  Mailing [place] [address] [place], [place] [address] Physical Address[address], [place], [place] [address] Toll Free Main # [phone] Fax # [phone] Cell # [phone]  Office # [phone] [email or web address]

## 2018-12-25 DIAGNOSIS — J449 Chronic obstructive pulmonary disease, unspecified: Secondary | ICD-10-CM | POA: Diagnosis not present

## 2018-12-25 DIAGNOSIS — Z86711 Personal history of pulmonary embolism: Secondary | ICD-10-CM | POA: Diagnosis not present

## 2018-12-25 DIAGNOSIS — Z77098 Contact with and (suspected) exposure to other hazardous, chiefly nonmedicinal, chemicals: Secondary | ICD-10-CM | POA: Diagnosis not present

## 2018-12-25 DIAGNOSIS — I4892 Unspecified atrial flutter: Secondary | ICD-10-CM | POA: Diagnosis not present

## 2018-12-25 DIAGNOSIS — G4733 Obstructive sleep apnea (adult) (pediatric): Secondary | ICD-10-CM | POA: Diagnosis not present

## 2018-12-25 DIAGNOSIS — Z9981 Dependence on supplemental oxygen: Secondary | ICD-10-CM | POA: Diagnosis not present

## 2018-12-25 DIAGNOSIS — Z951 Presence of aortocoronary bypass graft: Secondary | ICD-10-CM | POA: Diagnosis not present

## 2018-12-25 DIAGNOSIS — I2510 Atherosclerotic heart disease of native coronary artery without angina pectoris: Secondary | ICD-10-CM | POA: Diagnosis not present

## 2018-12-25 DIAGNOSIS — R1310 Dysphagia, unspecified: Secondary | ICD-10-CM | POA: Diagnosis not present

## 2018-12-25 DIAGNOSIS — K222 Esophageal obstruction: Secondary | ICD-10-CM | POA: Diagnosis not present

## 2018-12-25 DIAGNOSIS — I5042 Chronic combined systolic (congestive) and diastolic (congestive) heart failure: Secondary | ICD-10-CM | POA: Diagnosis not present

## 2018-12-25 DIAGNOSIS — F0390 Unspecified dementia without behavioral disturbance: Secondary | ICD-10-CM | POA: Diagnosis not present

## 2018-12-25 DIAGNOSIS — Z77090 Contact with and (suspected) exposure to asbestos: Secondary | ICD-10-CM | POA: Diagnosis not present

## 2018-12-25 DIAGNOSIS — G40909 Epilepsy, unspecified, not intractable, without status epilepticus: Secondary | ICD-10-CM | POA: Diagnosis not present

## 2018-12-25 DIAGNOSIS — I110 Hypertensive heart disease with heart failure: Secondary | ICD-10-CM | POA: Diagnosis not present

## 2018-12-26 DIAGNOSIS — R1310 Dysphagia, unspecified: Secondary | ICD-10-CM | POA: Diagnosis not present

## 2018-12-26 DIAGNOSIS — K222 Esophageal obstruction: Secondary | ICD-10-CM | POA: Diagnosis not present

## 2018-12-26 DIAGNOSIS — I4892 Unspecified atrial flutter: Secondary | ICD-10-CM | POA: Diagnosis not present

## 2018-12-26 DIAGNOSIS — I5042 Chronic combined systolic (congestive) and diastolic (congestive) heart failure: Secondary | ICD-10-CM | POA: Diagnosis not present

## 2018-12-26 DIAGNOSIS — Z9981 Dependence on supplemental oxygen: Secondary | ICD-10-CM | POA: Diagnosis not present

## 2018-12-26 DIAGNOSIS — I110 Hypertensive heart disease with heart failure: Secondary | ICD-10-CM | POA: Diagnosis not present

## 2018-12-28 ENCOUNTER — Encounter: Payer: Self-pay | Admitting: *Deleted

## 2018-12-28 ENCOUNTER — Other Ambulatory Visit: Payer: Self-pay | Admitting: *Deleted

## 2018-12-28 NOTE — Patient Outreach (Signed)
[facility]) Care Management  12/28/2018  MEAGAN SPEASE 07-22-1946 [id number]   CSW was able to make contact with patient today to follow-up regarding counseling and supportive services, as well as to ensure that patient's wife, Oiva Dibari received the list of counselors/therapists in [place] that [place] mailed to patient's home.  Patient admitted to receiving the information, but encouraged CSW to contact Mrs. Cella directly to explain to her the reason for patient receiving the information.  CSW agreed to outreach to Mrs. Radde as soon as CSW's call with patient was terminated.  Patient indicated that he is still experiencing a great deal of pain, which he believes is attributing to his symptoms of depression.  CSW explained to patient that CSW has placed a call to patient's Primary Care Physician, Dr. Bernell List at [facility], to voice concerns about patient's ongoing pain and request intervention.  CSW was unable to speak with anyone directly, but left a HIPAA compliant message on voicemail for Dr. Polly Cobia nurse, requesting that a follow-up call be made to patient.  CSW tried to provide counseling and supportive services to patient today; however, patient admitted that he was experiencing "too much pain" to want to spend any time on the phone.  Patient further reported that it is difficult for him to hear on his cell phone, while residing at the long-term care skilled nursing facility, [facility] [place].  Patient reported, "I have your number and will call you if I need anything".  Patient requested that no further outreach calls be made to him, but to ensure that Mrs. Uphoff also has CSW's contact information.  CSW was able to make contact with Mrs. Erler to explain the reason for patient receiving the list of counselors/therapists in [facility], as well as answer any questions that she had.  Mrs. Ernster indicated that she plans to get  patient established with a counselor through [employer], as soon as Covid-19 restrictions are lifted.  Mrs. Ingalsbe reported that she already has a Social worker in mind and that she also plans to enlist the help of the counselor to get patient established with a psychiatrist that can prescribe and/or change patient's psychotropic medications.  CSW agreed to provide Mrs. Twichell with assistance during this process.  However, Mrs. Avilla did not think that getting patient established with a counselor and psychiatrist through [employer] would be a difficult task for her to undertake, but agreed to contact CSW directly, if she learned otherwise.  Mrs. Krakow was most appreciative of all assistance provided to her and patient, thus far, apologizing that patient has not been more receptive to CSW's attempts to try and engage with patient.  Mrs. Sedlacek indicated that patient will probably be reluctant to actually seek treatment, of any kind, when the time comes.    CSW explained to Mrs. Tindol that CSW is more than happy to meet with patient at [facility] [place], to provide counseling and supportive services, once Covid-19 restrictions are lifted, at least until Mrs. Grayer is able to get patient established with ongoing psychotherapeutic services for medication management and psychotherapy.  Mrs. Lonigro voiced understanding and was agreeable to this plan, if patient is receptive.  Mrs. Marinello took down CSW's contact information, agreeing to contact CSW in the near future if patient is agreeable to services, or if they need further assistance.  Mrs. Oberhaus is aware that CSW has placed a call to Dr. Felton Clinton to discuss patient's pain symptoms, as  well as request intervention. Mrs. Alewine agreed to follow-up with Dr. Felton Clinton within the next two weeks, if a return call is not received from Dr. Felton Clinton or his nurse in the meantime.  CSW will perform a case closure on patient, as all goals of treatment have been met  from social work standpoint and no additional social work needs have been identified at this time.  CSW will fax an update to patient's Primary Care Physician, Dr. Bernell List to ensure that they are aware of CSW's involvement with patient's plan of care.    Nat Christen, BSW, MSW, LCSW  Licensed Education officer, environmental Health System  Mailing [place] [address], [place], [place] [address] Physical Address-[address], [place], [place] [address] Toll Free Main # [phone] Fax # [phone] Cell # [phone]  Office # [phone] [email or web address]

## 2019-01-01 DIAGNOSIS — I4892 Unspecified atrial flutter: Secondary | ICD-10-CM | POA: Diagnosis not present

## 2019-01-01 DIAGNOSIS — Z9981 Dependence on supplemental oxygen: Secondary | ICD-10-CM | POA: Diagnosis not present

## 2019-01-01 DIAGNOSIS — I110 Hypertensive heart disease with heart failure: Secondary | ICD-10-CM | POA: Diagnosis not present

## 2019-01-01 DIAGNOSIS — I5042 Chronic combined systolic (congestive) and diastolic (congestive) heart failure: Secondary | ICD-10-CM | POA: Diagnosis not present

## 2019-01-01 DIAGNOSIS — R1310 Dysphagia, unspecified: Secondary | ICD-10-CM | POA: Diagnosis not present

## 2019-01-01 DIAGNOSIS — K222 Esophageal obstruction: Secondary | ICD-10-CM | POA: Diagnosis not present

## 2019-01-12 DIAGNOSIS — F0391 Unspecified dementia with behavioral disturbance: Secondary | ICD-10-CM | POA: Diagnosis not present

## 2019-01-12 DIAGNOSIS — J449 Chronic obstructive pulmonary disease, unspecified: Secondary | ICD-10-CM | POA: Diagnosis not present

## 2019-01-12 DIAGNOSIS — I509 Heart failure, unspecified: Secondary | ICD-10-CM | POA: Diagnosis not present

## 2019-01-12 DIAGNOSIS — R609 Edema, unspecified: Secondary | ICD-10-CM | POA: Diagnosis not present

## 2019-01-15 DIAGNOSIS — I2510 Atherosclerotic heart disease of native coronary artery without angina pectoris: Secondary | ICD-10-CM | POA: Diagnosis not present

## 2019-01-15 DIAGNOSIS — I509 Heart failure, unspecified: Secondary | ICD-10-CM | POA: Diagnosis not present

## 2019-01-15 DIAGNOSIS — F429 Obsessive-compulsive disorder, unspecified: Secondary | ICD-10-CM | POA: Diagnosis not present

## 2019-01-15 DIAGNOSIS — F0151 Vascular dementia with behavioral disturbance: Secondary | ICD-10-CM | POA: Diagnosis not present

## 2019-01-21 ENCOUNTER — Other Ambulatory Visit: Payer: Self-pay

## 2019-01-21 DIAGNOSIS — Z9981 Dependence on supplemental oxygen: Secondary | ICD-10-CM | POA: Diagnosis not present

## 2019-01-21 DIAGNOSIS — I110 Hypertensive heart disease with heart failure: Secondary | ICD-10-CM | POA: Diagnosis not present

## 2019-01-21 DIAGNOSIS — R1310 Dysphagia, unspecified: Secondary | ICD-10-CM | POA: Diagnosis not present

## 2019-01-21 DIAGNOSIS — I4892 Unspecified atrial flutter: Secondary | ICD-10-CM | POA: Diagnosis not present

## 2019-01-21 DIAGNOSIS — I5042 Chronic combined systolic (congestive) and diastolic (congestive) heart failure: Secondary | ICD-10-CM | POA: Diagnosis not present

## 2019-01-21 DIAGNOSIS — K222 Esophageal obstruction: Secondary | ICD-10-CM | POA: Diagnosis not present

## 2019-01-25 DIAGNOSIS — I2510 Atherosclerotic heart disease of native coronary artery without angina pectoris: Secondary | ICD-10-CM | POA: Diagnosis not present

## 2019-01-25 DIAGNOSIS — R1310 Dysphagia, unspecified: Secondary | ICD-10-CM | POA: Diagnosis not present

## 2019-01-25 DIAGNOSIS — K222 Esophageal obstruction: Secondary | ICD-10-CM | POA: Diagnosis not present

## 2019-01-25 DIAGNOSIS — Z9981 Dependence on supplemental oxygen: Secondary | ICD-10-CM | POA: Diagnosis not present

## 2019-01-25 DIAGNOSIS — G4733 Obstructive sleep apnea (adult) (pediatric): Secondary | ICD-10-CM | POA: Diagnosis not present

## 2019-01-25 DIAGNOSIS — Z951 Presence of aortocoronary bypass graft: Secondary | ICD-10-CM | POA: Diagnosis not present

## 2019-01-25 DIAGNOSIS — Z77098 Contact with and (suspected) exposure to other hazardous, chiefly nonmedicinal, chemicals: Secondary | ICD-10-CM | POA: Diagnosis not present

## 2019-01-25 DIAGNOSIS — J449 Chronic obstructive pulmonary disease, unspecified: Secondary | ICD-10-CM | POA: Diagnosis not present

## 2019-01-25 DIAGNOSIS — Z86711 Personal history of pulmonary embolism: Secondary | ICD-10-CM | POA: Diagnosis not present

## 2019-01-25 DIAGNOSIS — I110 Hypertensive heart disease with heart failure: Secondary | ICD-10-CM | POA: Diagnosis not present

## 2019-01-25 DIAGNOSIS — Z77090 Contact with and (suspected) exposure to asbestos: Secondary | ICD-10-CM | POA: Diagnosis not present

## 2019-01-25 DIAGNOSIS — F0390 Unspecified dementia without behavioral disturbance: Secondary | ICD-10-CM | POA: Diagnosis not present

## 2019-01-25 DIAGNOSIS — G40909 Epilepsy, unspecified, not intractable, without status epilepticus: Secondary | ICD-10-CM | POA: Diagnosis not present

## 2019-01-25 DIAGNOSIS — I5042 Chronic combined systolic (congestive) and diastolic (congestive) heart failure: Secondary | ICD-10-CM | POA: Diagnosis not present

## 2019-01-25 DIAGNOSIS — I4892 Unspecified atrial flutter: Secondary | ICD-10-CM | POA: Diagnosis not present

## 2019-02-07 IMAGING — RF DG ESOPHAGUS
8 series · 19 of 24 positions shown · non-contrast
Comparison: None.

CLINICAL DATA: Esophageal dysmotility

EXAM:
ESOPHOGRAM/BARIUM SWALLOW
TECHNIQUE: Single contrast examination was performed using  thin barium.
FLUOROSCOPY TIME:  Fluoroscopy Time:  3 min 6 sec
Radiation Exposure Index (if provided by the fluoroscopic device):
Number of Acquired Spot Images: 0

[Series 1: cp_standard · 0.35mm/px · 2 of 395 frames shown (1 of 8)]
[frame 28/395]
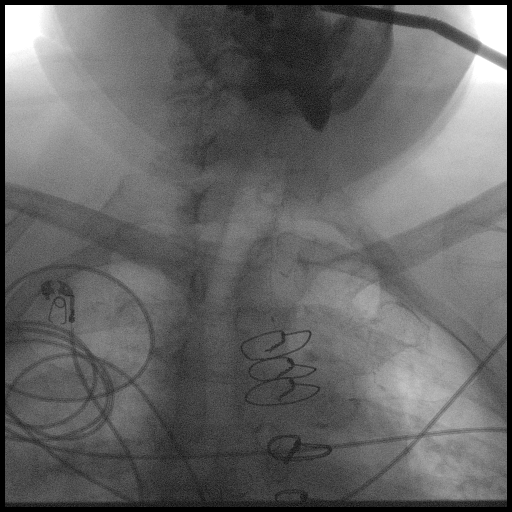
[frame 60/395]
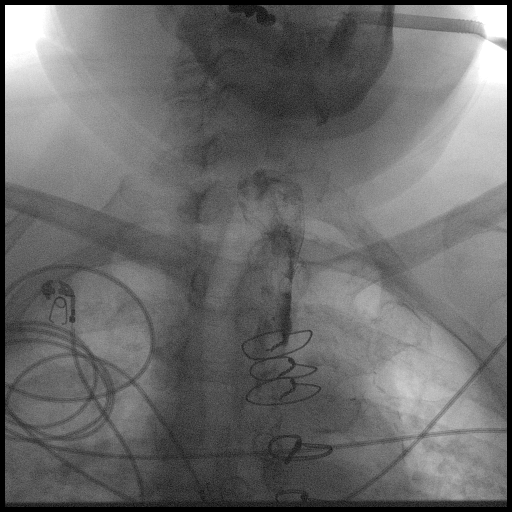

[Series 2: cp_standard · 0.36mm/px · 3 of 192 frames shown (2 of 8)]
[frame 4/192]
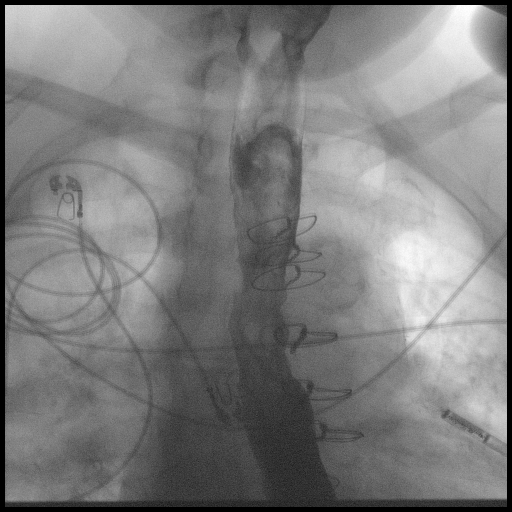
[frame 29/192]
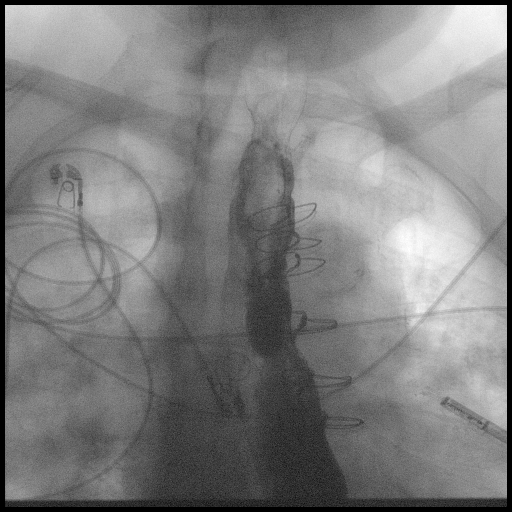
[frame 164/192]
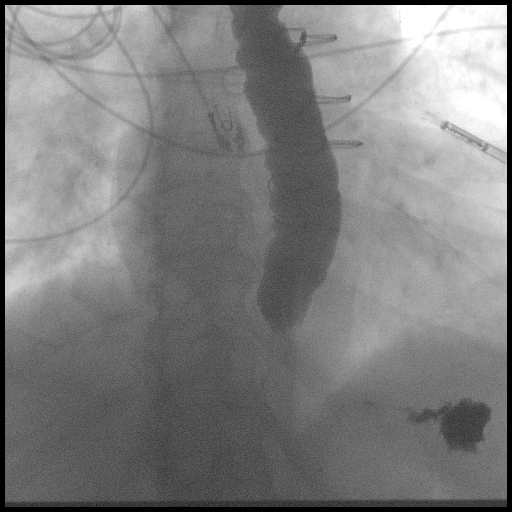

[Series 3: cp_standard · 0.35mm/px · 2 of 179 frames shown (3 of 8)]
[frame 27/179]
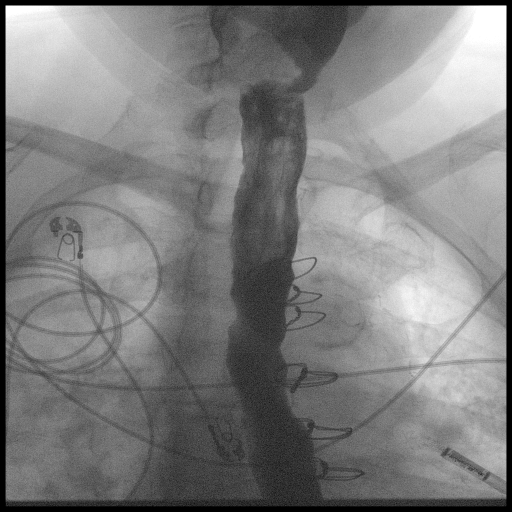
[frame 153/179]
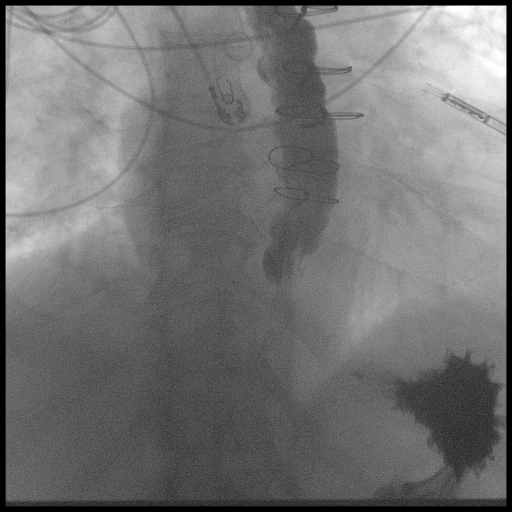

[Series 4: cp_standard · 0.36mm/px · 2 of 125 frames shown (4 of 8)]
[frame 19/125]
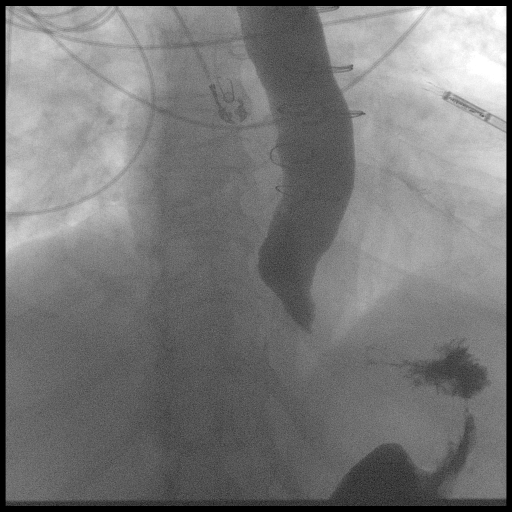
[frame 63/125]
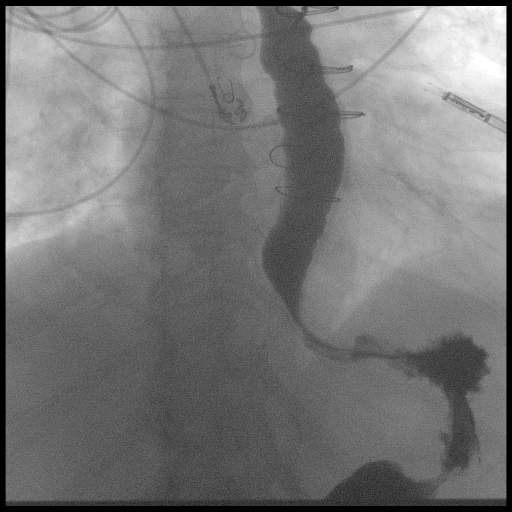

[Series 5: cp_standard · 0.36mm/px · 3 of 163 frames shown (5 of 8)]
[frame 25/163]
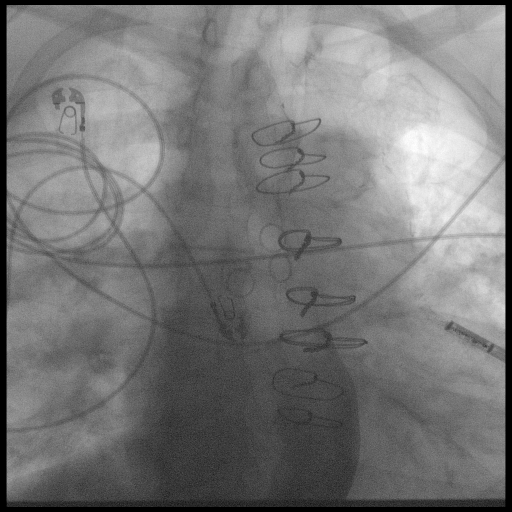
[frame 82/163]
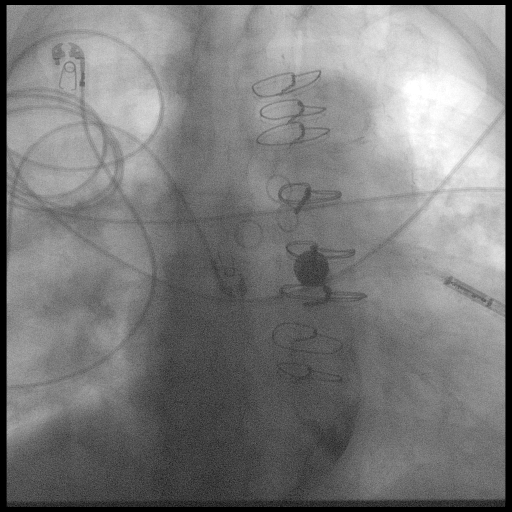
[frame 139/163]
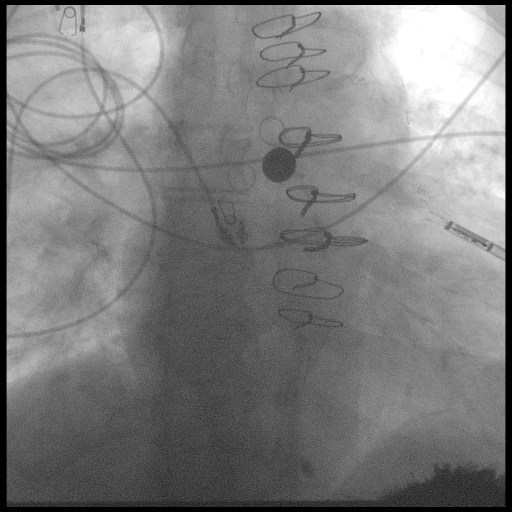

[Series 6: cp_standard · 0.36mm/px · 2 of 176 frames shown (6 of 8)]
[frame 27/176]
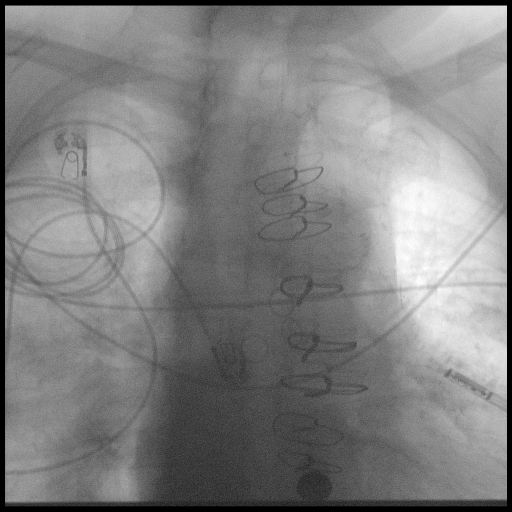
[frame 150/176]
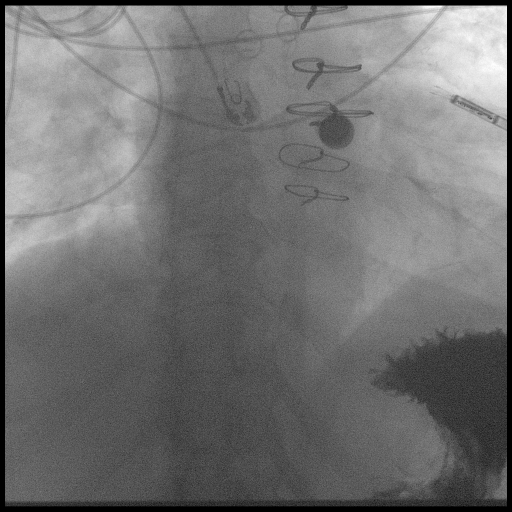

[Series 7: cp_standard · 0.36mm/px · 3 of 463 frames shown (7 of 8)]
[frame 4/463]
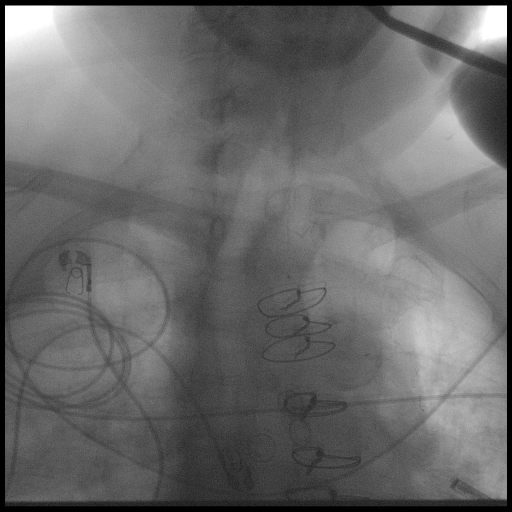
[frame 232/463]
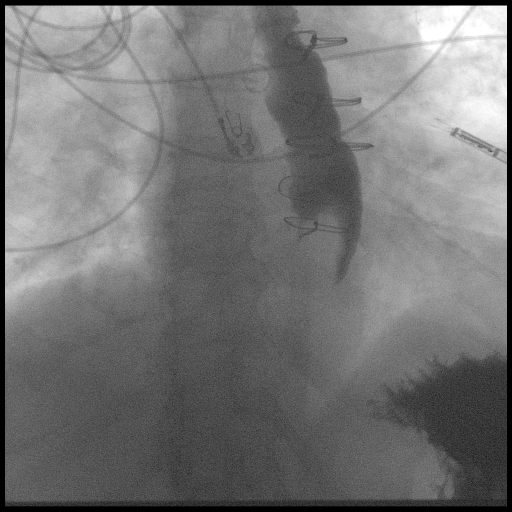
[frame 394/463]
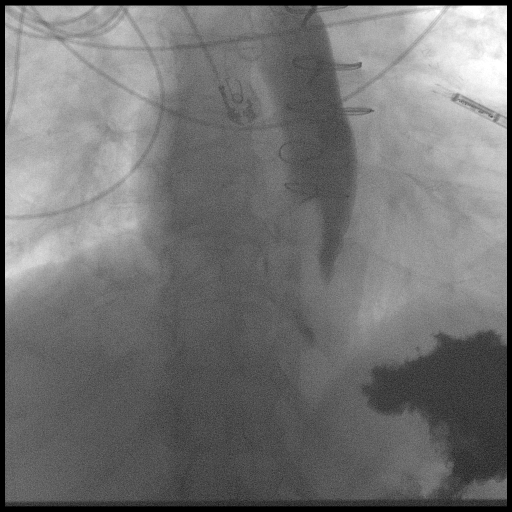

[Series 8: cp_standard · 0.36mm/px · 2 of 313 frames shown (8 of 8)]
[frame 157/313]
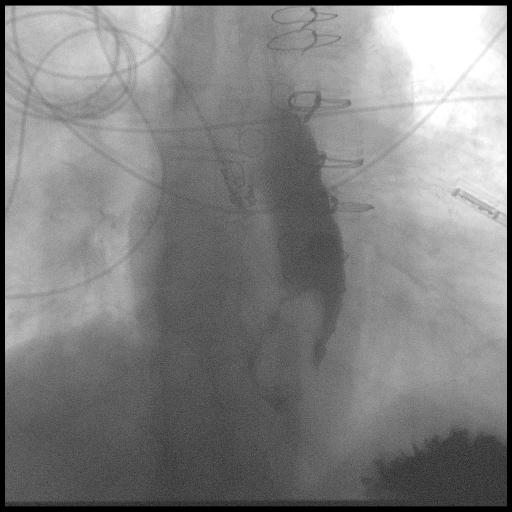
[frame 267/313]
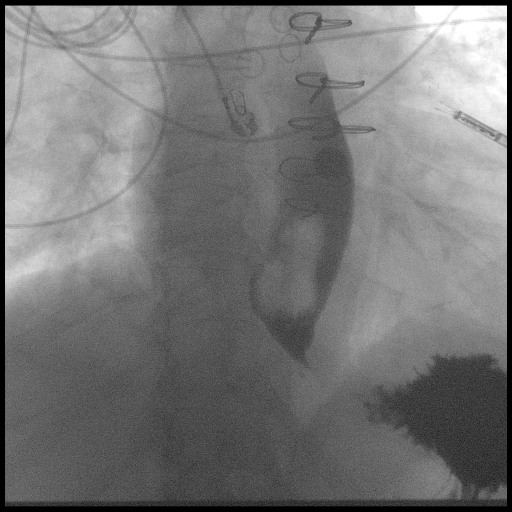

[19 of 24 positions shown; findings below may reference images not displayed]

FINDINGS: Fluoroscopic evaluation of swallowing demonstrates severe
dysmotility with loss of all primary esophageal peristaltic waves
and extensive tertiary contractions throughout the esophagus.
Initial imaging in the upper esophageal region demonstrates lobular
soft tissue filling defect along the right lateral wall of the upper
thoracic esophagus near the thoracic inlet. On subsequent imaging,
this moves distally compatible with retained large food bolus. This
food bolus and the 13 mm barium tablet sticks in the mid to distal
esophagus. The distal esophagus never distends well. Cannot exclude
distal esophageal stricture.
IMPRESSION: Large retained fluid bolus and possible food impaction noted. This
food bolus moved from the upper thoracic esophagus to the lower
esophagus during the study. There is poor distention and expansion
of the distal esophagus. Cannot exclude distal esophageal stricture.

Severe dysmotility.

## 2019-02-12 ENCOUNTER — Telehealth: Payer: Self-pay

## 2019-02-12 DIAGNOSIS — K222 Esophageal obstruction: Secondary | ICD-10-CM | POA: Diagnosis not present

## 2019-02-12 DIAGNOSIS — I5042 Chronic combined systolic (congestive) and diastolic (congestive) heart failure: Secondary | ICD-10-CM | POA: Diagnosis not present

## 2019-02-12 DIAGNOSIS — R1310 Dysphagia, unspecified: Secondary | ICD-10-CM | POA: Diagnosis not present

## 2019-02-12 DIAGNOSIS — I110 Hypertensive heart disease with heart failure: Secondary | ICD-10-CM | POA: Diagnosis not present

## 2019-02-12 DIAGNOSIS — Z9981 Dependence on supplemental oxygen: Secondary | ICD-10-CM | POA: Diagnosis not present

## 2019-02-12 DIAGNOSIS — I4892 Unspecified atrial flutter: Secondary | ICD-10-CM | POA: Diagnosis not present

## 2019-02-12 NOTE — Telephone Encounter (Signed)
Received a letter from patient's wife requesting a letter stating patient was mentally competent to transact legal matters in 04/03/9832 for application for a reverse mortgage.Letter was done,signed by Dr.Jordan.Letter mailed to her home.

## 2019-02-15 DIAGNOSIS — Z9981 Dependence on supplemental oxygen: Secondary | ICD-10-CM | POA: Diagnosis not present

## 2019-02-15 DIAGNOSIS — R1310 Dysphagia, unspecified: Secondary | ICD-10-CM | POA: Diagnosis not present

## 2019-02-15 DIAGNOSIS — I5042 Chronic combined systolic (congestive) and diastolic (congestive) heart failure: Secondary | ICD-10-CM | POA: Diagnosis not present

## 2019-02-15 DIAGNOSIS — I4892 Unspecified atrial flutter: Secondary | ICD-10-CM | POA: Diagnosis not present

## 2019-02-15 DIAGNOSIS — K222 Esophageal obstruction: Secondary | ICD-10-CM | POA: Diagnosis not present

## 2019-02-15 DIAGNOSIS — I110 Hypertensive heart disease with heart failure: Secondary | ICD-10-CM | POA: Diagnosis not present

## 2019-02-19 DIAGNOSIS — R1310 Dysphagia, unspecified: Secondary | ICD-10-CM | POA: Diagnosis not present

## 2019-02-19 DIAGNOSIS — I110 Hypertensive heart disease with heart failure: Secondary | ICD-10-CM | POA: Diagnosis not present

## 2019-02-19 DIAGNOSIS — Z9981 Dependence on supplemental oxygen: Secondary | ICD-10-CM | POA: Diagnosis not present

## 2019-02-19 DIAGNOSIS — I4892 Unspecified atrial flutter: Secondary | ICD-10-CM | POA: Diagnosis not present

## 2019-02-19 DIAGNOSIS — K222 Esophageal obstruction: Secondary | ICD-10-CM | POA: Diagnosis not present

## 2019-02-19 DIAGNOSIS — I5042 Chronic combined systolic (congestive) and diastolic (congestive) heart failure: Secondary | ICD-10-CM | POA: Diagnosis not present

## 2019-02-22 DIAGNOSIS — I110 Hypertensive heart disease with heart failure: Secondary | ICD-10-CM | POA: Diagnosis not present

## 2019-02-22 DIAGNOSIS — I5042 Chronic combined systolic (congestive) and diastolic (congestive) heart failure: Secondary | ICD-10-CM | POA: Diagnosis not present

## 2019-02-22 DIAGNOSIS — Z9981 Dependence on supplemental oxygen: Secondary | ICD-10-CM | POA: Diagnosis not present

## 2019-02-22 DIAGNOSIS — I4892 Unspecified atrial flutter: Secondary | ICD-10-CM | POA: Diagnosis not present

## 2019-02-22 DIAGNOSIS — K222 Esophageal obstruction: Secondary | ICD-10-CM | POA: Diagnosis not present

## 2019-02-22 DIAGNOSIS — R1310 Dysphagia, unspecified: Secondary | ICD-10-CM | POA: Diagnosis not present

## 2019-02-24 DIAGNOSIS — I110 Hypertensive heart disease with heart failure: Secondary | ICD-10-CM | POA: Diagnosis not present

## 2019-02-24 DIAGNOSIS — Z77090 Contact with and (suspected) exposure to asbestos: Secondary | ICD-10-CM | POA: Diagnosis not present

## 2019-02-24 DIAGNOSIS — Z77098 Contact with and (suspected) exposure to other hazardous, chiefly nonmedicinal, chemicals: Secondary | ICD-10-CM | POA: Diagnosis not present

## 2019-02-24 DIAGNOSIS — F0390 Unspecified dementia without behavioral disturbance: Secondary | ICD-10-CM | POA: Diagnosis not present

## 2019-02-24 DIAGNOSIS — Z9981 Dependence on supplemental oxygen: Secondary | ICD-10-CM | POA: Diagnosis not present

## 2019-02-24 DIAGNOSIS — G40909 Epilepsy, unspecified, not intractable, without status epilepticus: Secondary | ICD-10-CM | POA: Diagnosis not present

## 2019-02-24 DIAGNOSIS — I5042 Chronic combined systolic (congestive) and diastolic (congestive) heart failure: Secondary | ICD-10-CM | POA: Diagnosis not present

## 2019-02-24 DIAGNOSIS — I2510 Atherosclerotic heart disease of native coronary artery without angina pectoris: Secondary | ICD-10-CM | POA: Diagnosis not present

## 2019-02-24 DIAGNOSIS — J449 Chronic obstructive pulmonary disease, unspecified: Secondary | ICD-10-CM | POA: Diagnosis not present

## 2019-02-24 DIAGNOSIS — G4733 Obstructive sleep apnea (adult) (pediatric): Secondary | ICD-10-CM | POA: Diagnosis not present

## 2019-02-24 DIAGNOSIS — Z951 Presence of aortocoronary bypass graft: Secondary | ICD-10-CM | POA: Diagnosis not present

## 2019-02-24 DIAGNOSIS — R1310 Dysphagia, unspecified: Secondary | ICD-10-CM | POA: Diagnosis not present

## 2019-02-24 DIAGNOSIS — I4892 Unspecified atrial flutter: Secondary | ICD-10-CM | POA: Diagnosis not present

## 2019-02-24 DIAGNOSIS — Z86711 Personal history of pulmonary embolism: Secondary | ICD-10-CM | POA: Diagnosis not present

## 2019-02-24 DIAGNOSIS — K222 Esophageal obstruction: Secondary | ICD-10-CM | POA: Diagnosis not present

## 2019-02-25 DIAGNOSIS — K222 Esophageal obstruction: Secondary | ICD-10-CM | POA: Diagnosis not present

## 2019-02-25 DIAGNOSIS — R1310 Dysphagia, unspecified: Secondary | ICD-10-CM | POA: Diagnosis not present

## 2019-02-25 DIAGNOSIS — I110 Hypertensive heart disease with heart failure: Secondary | ICD-10-CM | POA: Diagnosis not present

## 2019-02-25 DIAGNOSIS — I4892 Unspecified atrial flutter: Secondary | ICD-10-CM | POA: Diagnosis not present

## 2019-02-25 DIAGNOSIS — I5042 Chronic combined systolic (congestive) and diastolic (congestive) heart failure: Secondary | ICD-10-CM | POA: Diagnosis not present

## 2019-02-25 DIAGNOSIS — Z9981 Dependence on supplemental oxygen: Secondary | ICD-10-CM | POA: Diagnosis not present

## 2019-02-26 DIAGNOSIS — I5042 Chronic combined systolic (congestive) and diastolic (congestive) heart failure: Secondary | ICD-10-CM | POA: Diagnosis not present

## 2019-02-26 DIAGNOSIS — I110 Hypertensive heart disease with heart failure: Secondary | ICD-10-CM | POA: Diagnosis not present

## 2019-02-26 DIAGNOSIS — Z9981 Dependence on supplemental oxygen: Secondary | ICD-10-CM | POA: Diagnosis not present

## 2019-02-26 DIAGNOSIS — I4892 Unspecified atrial flutter: Secondary | ICD-10-CM | POA: Diagnosis not present

## 2019-02-26 DIAGNOSIS — R1310 Dysphagia, unspecified: Secondary | ICD-10-CM | POA: Diagnosis not present

## 2019-02-26 DIAGNOSIS — K222 Esophageal obstruction: Secondary | ICD-10-CM | POA: Diagnosis not present

## 2019-03-02 DIAGNOSIS — I4892 Unspecified atrial flutter: Secondary | ICD-10-CM | POA: Diagnosis not present

## 2019-03-02 DIAGNOSIS — I110 Hypertensive heart disease with heart failure: Secondary | ICD-10-CM | POA: Diagnosis not present

## 2019-03-02 DIAGNOSIS — R1310 Dysphagia, unspecified: Secondary | ICD-10-CM | POA: Diagnosis not present

## 2019-03-02 DIAGNOSIS — Z9981 Dependence on supplemental oxygen: Secondary | ICD-10-CM | POA: Diagnosis not present

## 2019-03-02 DIAGNOSIS — I5042 Chronic combined systolic (congestive) and diastolic (congestive) heart failure: Secondary | ICD-10-CM | POA: Diagnosis not present

## 2019-03-02 DIAGNOSIS — K222 Esophageal obstruction: Secondary | ICD-10-CM | POA: Diagnosis not present

## 2019-03-05 NOTE — Telephone Encounter (Signed)
Chart opened in error

## 2019-03-09 DIAGNOSIS — R1310 Dysphagia, unspecified: Secondary | ICD-10-CM | POA: Diagnosis not present

## 2019-03-09 DIAGNOSIS — K222 Esophageal obstruction: Secondary | ICD-10-CM | POA: Diagnosis not present

## 2019-03-09 DIAGNOSIS — Z9981 Dependence on supplemental oxygen: Secondary | ICD-10-CM | POA: Diagnosis not present

## 2019-03-09 DIAGNOSIS — I4892 Unspecified atrial flutter: Secondary | ICD-10-CM | POA: Diagnosis not present

## 2019-03-09 DIAGNOSIS — I110 Hypertensive heart disease with heart failure: Secondary | ICD-10-CM | POA: Diagnosis not present

## 2019-03-09 DIAGNOSIS — I5042 Chronic combined systolic (congestive) and diastolic (congestive) heart failure: Secondary | ICD-10-CM | POA: Diagnosis not present

## 2019-03-11 DIAGNOSIS — K222 Esophageal obstruction: Secondary | ICD-10-CM | POA: Diagnosis not present

## 2019-03-11 DIAGNOSIS — R1310 Dysphagia, unspecified: Secondary | ICD-10-CM | POA: Diagnosis not present

## 2019-03-11 DIAGNOSIS — I110 Hypertensive heart disease with heart failure: Secondary | ICD-10-CM | POA: Diagnosis not present

## 2019-03-11 DIAGNOSIS — I4892 Unspecified atrial flutter: Secondary | ICD-10-CM | POA: Diagnosis not present

## 2019-03-11 DIAGNOSIS — Z9981 Dependence on supplemental oxygen: Secondary | ICD-10-CM | POA: Diagnosis not present

## 2019-03-11 DIAGNOSIS — I5042 Chronic combined systolic (congestive) and diastolic (congestive) heart failure: Secondary | ICD-10-CM | POA: Diagnosis not present

## 2019-03-12 DIAGNOSIS — Z9981 Dependence on supplemental oxygen: Secondary | ICD-10-CM | POA: Diagnosis not present

## 2019-03-12 DIAGNOSIS — K222 Esophageal obstruction: Secondary | ICD-10-CM | POA: Diagnosis not present

## 2019-03-12 DIAGNOSIS — I5042 Chronic combined systolic (congestive) and diastolic (congestive) heart failure: Secondary | ICD-10-CM | POA: Diagnosis not present

## 2019-03-12 DIAGNOSIS — R1310 Dysphagia, unspecified: Secondary | ICD-10-CM | POA: Diagnosis not present

## 2019-03-12 DIAGNOSIS — I4892 Unspecified atrial flutter: Secondary | ICD-10-CM | POA: Diagnosis not present

## 2019-03-12 DIAGNOSIS — I110 Hypertensive heart disease with heart failure: Secondary | ICD-10-CM | POA: Diagnosis not present

## 2019-03-17 DIAGNOSIS — I110 Hypertensive heart disease with heart failure: Secondary | ICD-10-CM | POA: Diagnosis not present

## 2019-03-17 DIAGNOSIS — K222 Esophageal obstruction: Secondary | ICD-10-CM | POA: Diagnosis not present

## 2019-03-17 DIAGNOSIS — I4892 Unspecified atrial flutter: Secondary | ICD-10-CM | POA: Diagnosis not present

## 2019-03-17 DIAGNOSIS — I5042 Chronic combined systolic (congestive) and diastolic (congestive) heart failure: Secondary | ICD-10-CM | POA: Diagnosis not present

## 2019-03-17 DIAGNOSIS — R1310 Dysphagia, unspecified: Secondary | ICD-10-CM | POA: Diagnosis not present

## 2019-03-17 DIAGNOSIS — Z9981 Dependence on supplemental oxygen: Secondary | ICD-10-CM | POA: Diagnosis not present

## 2019-03-18 DIAGNOSIS — K222 Esophageal obstruction: Secondary | ICD-10-CM | POA: Diagnosis not present

## 2019-03-18 DIAGNOSIS — I110 Hypertensive heart disease with heart failure: Secondary | ICD-10-CM | POA: Diagnosis not present

## 2019-03-18 DIAGNOSIS — Z9981 Dependence on supplemental oxygen: Secondary | ICD-10-CM | POA: Diagnosis not present

## 2019-03-18 DIAGNOSIS — I4892 Unspecified atrial flutter: Secondary | ICD-10-CM | POA: Diagnosis not present

## 2019-03-18 DIAGNOSIS — R1310 Dysphagia, unspecified: Secondary | ICD-10-CM | POA: Diagnosis not present

## 2019-03-18 DIAGNOSIS — I5042 Chronic combined systolic (congestive) and diastolic (congestive) heart failure: Secondary | ICD-10-CM | POA: Diagnosis not present

## 2019-03-19 DIAGNOSIS — K222 Esophageal obstruction: Secondary | ICD-10-CM | POA: Diagnosis not present

## 2019-03-19 DIAGNOSIS — Z9981 Dependence on supplemental oxygen: Secondary | ICD-10-CM | POA: Diagnosis not present

## 2019-03-19 DIAGNOSIS — I5042 Chronic combined systolic (congestive) and diastolic (congestive) heart failure: Secondary | ICD-10-CM | POA: Diagnosis not present

## 2019-03-19 DIAGNOSIS — R1310 Dysphagia, unspecified: Secondary | ICD-10-CM | POA: Diagnosis not present

## 2019-03-19 DIAGNOSIS — I110 Hypertensive heart disease with heart failure: Secondary | ICD-10-CM | POA: Diagnosis not present

## 2019-03-19 DIAGNOSIS — I4892 Unspecified atrial flutter: Secondary | ICD-10-CM | POA: Diagnosis not present

## 2019-03-20 DIAGNOSIS — I4892 Unspecified atrial flutter: Secondary | ICD-10-CM | POA: Diagnosis not present

## 2019-03-20 DIAGNOSIS — I5042 Chronic combined systolic (congestive) and diastolic (congestive) heart failure: Secondary | ICD-10-CM | POA: Diagnosis not present

## 2019-03-20 DIAGNOSIS — I110 Hypertensive heart disease with heart failure: Secondary | ICD-10-CM | POA: Diagnosis not present

## 2019-03-20 DIAGNOSIS — Z9981 Dependence on supplemental oxygen: Secondary | ICD-10-CM | POA: Diagnosis not present

## 2019-03-20 DIAGNOSIS — R1310 Dysphagia, unspecified: Secondary | ICD-10-CM | POA: Diagnosis not present

## 2019-03-20 DIAGNOSIS — K222 Esophageal obstruction: Secondary | ICD-10-CM | POA: Diagnosis not present

## 2019-03-21 DIAGNOSIS — K222 Esophageal obstruction: Secondary | ICD-10-CM | POA: Diagnosis not present

## 2019-03-21 DIAGNOSIS — R1310 Dysphagia, unspecified: Secondary | ICD-10-CM | POA: Diagnosis not present

## 2019-03-21 DIAGNOSIS — I110 Hypertensive heart disease with heart failure: Secondary | ICD-10-CM | POA: Diagnosis not present

## 2019-03-21 DIAGNOSIS — I5042 Chronic combined systolic (congestive) and diastolic (congestive) heart failure: Secondary | ICD-10-CM | POA: Diagnosis not present

## 2019-03-21 DIAGNOSIS — Z9981 Dependence on supplemental oxygen: Secondary | ICD-10-CM | POA: Diagnosis not present

## 2019-03-21 DIAGNOSIS — I4892 Unspecified atrial flutter: Secondary | ICD-10-CM | POA: Diagnosis not present

## 2019-03-23 DIAGNOSIS — I4892 Unspecified atrial flutter: Secondary | ICD-10-CM | POA: Diagnosis not present

## 2019-03-23 DIAGNOSIS — I5042 Chronic combined systolic (congestive) and diastolic (congestive) heart failure: Secondary | ICD-10-CM | POA: Diagnosis not present

## 2019-03-23 DIAGNOSIS — R1310 Dysphagia, unspecified: Secondary | ICD-10-CM | POA: Diagnosis not present

## 2019-03-23 DIAGNOSIS — I110 Hypertensive heart disease with heart failure: Secondary | ICD-10-CM | POA: Diagnosis not present

## 2019-03-23 DIAGNOSIS — Z9981 Dependence on supplemental oxygen: Secondary | ICD-10-CM | POA: Diagnosis not present

## 2019-03-23 DIAGNOSIS — K222 Esophageal obstruction: Secondary | ICD-10-CM | POA: Diagnosis not present

## 2019-03-25 DIAGNOSIS — R1310 Dysphagia, unspecified: Secondary | ICD-10-CM | POA: Diagnosis not present

## 2019-03-25 DIAGNOSIS — I4892 Unspecified atrial flutter: Secondary | ICD-10-CM | POA: Diagnosis not present

## 2019-03-25 DIAGNOSIS — K222 Esophageal obstruction: Secondary | ICD-10-CM | POA: Diagnosis not present

## 2019-03-25 DIAGNOSIS — I5042 Chronic combined systolic (congestive) and diastolic (congestive) heart failure: Secondary | ICD-10-CM | POA: Diagnosis not present

## 2019-03-25 DIAGNOSIS — Z9981 Dependence on supplemental oxygen: Secondary | ICD-10-CM | POA: Diagnosis not present

## 2019-03-25 DIAGNOSIS — I110 Hypertensive heart disease with heart failure: Secondary | ICD-10-CM | POA: Diagnosis not present

## 2019-03-27 DIAGNOSIS — I2510 Atherosclerotic heart disease of native coronary artery without angina pectoris: Secondary | ICD-10-CM | POA: Diagnosis not present

## 2019-03-27 DIAGNOSIS — Z8673 Personal history of transient ischemic attack (TIA), and cerebral infarction without residual deficits: Secondary | ICD-10-CM | POA: Diagnosis not present

## 2019-03-27 DIAGNOSIS — J449 Chronic obstructive pulmonary disease, unspecified: Secondary | ICD-10-CM | POA: Diagnosis not present

## 2019-03-27 DIAGNOSIS — Z9981 Dependence on supplemental oxygen: Secondary | ICD-10-CM | POA: Diagnosis not present

## 2019-03-27 DIAGNOSIS — F0390 Unspecified dementia without behavioral disturbance: Secondary | ICD-10-CM | POA: Diagnosis not present

## 2019-03-27 DIAGNOSIS — R1310 Dysphagia, unspecified: Secondary | ICD-10-CM | POA: Diagnosis not present

## 2019-03-27 DIAGNOSIS — G4733 Obstructive sleep apnea (adult) (pediatric): Secondary | ICD-10-CM | POA: Diagnosis not present

## 2019-03-27 DIAGNOSIS — Z77090 Contact with and (suspected) exposure to asbestos: Secondary | ICD-10-CM | POA: Diagnosis not present

## 2019-03-27 DIAGNOSIS — G40909 Epilepsy, unspecified, not intractable, without status epilepticus: Secondary | ICD-10-CM | POA: Diagnosis not present

## 2019-03-27 DIAGNOSIS — Z77098 Contact with and (suspected) exposure to other hazardous, chiefly nonmedicinal, chemicals: Secondary | ICD-10-CM | POA: Diagnosis not present

## 2019-03-27 DIAGNOSIS — I4892 Unspecified atrial flutter: Secondary | ICD-10-CM | POA: Diagnosis not present

## 2019-03-27 DIAGNOSIS — Z951 Presence of aortocoronary bypass graft: Secondary | ICD-10-CM | POA: Diagnosis not present

## 2019-03-27 DIAGNOSIS — Z86711 Personal history of pulmonary embolism: Secondary | ICD-10-CM | POA: Diagnosis not present

## 2019-03-27 DIAGNOSIS — I5042 Chronic combined systolic (congestive) and diastolic (congestive) heart failure: Secondary | ICD-10-CM | POA: Diagnosis not present

## 2019-03-27 DIAGNOSIS — I110 Hypertensive heart disease with heart failure: Secondary | ICD-10-CM | POA: Diagnosis not present

## 2019-03-27 DIAGNOSIS — K222 Esophageal obstruction: Secondary | ICD-10-CM | POA: Diagnosis not present

## 2019-03-28 DIAGNOSIS — K222 Esophageal obstruction: Secondary | ICD-10-CM | POA: Diagnosis not present

## 2019-03-28 DIAGNOSIS — I5042 Chronic combined systolic (congestive) and diastolic (congestive) heart failure: Secondary | ICD-10-CM | POA: Diagnosis not present

## 2019-03-28 DIAGNOSIS — I4892 Unspecified atrial flutter: Secondary | ICD-10-CM | POA: Diagnosis not present

## 2019-03-28 DIAGNOSIS — R1310 Dysphagia, unspecified: Secondary | ICD-10-CM | POA: Diagnosis not present

## 2019-03-28 DIAGNOSIS — I110 Hypertensive heart disease with heart failure: Secondary | ICD-10-CM | POA: Diagnosis not present

## 2019-03-28 DIAGNOSIS — Z9981 Dependence on supplemental oxygen: Secondary | ICD-10-CM | POA: Diagnosis not present

## 2019-03-29 DIAGNOSIS — Z9981 Dependence on supplemental oxygen: Secondary | ICD-10-CM | POA: Diagnosis not present

## 2019-03-29 DIAGNOSIS — I110 Hypertensive heart disease with heart failure: Secondary | ICD-10-CM | POA: Diagnosis not present

## 2019-03-29 DIAGNOSIS — I5042 Chronic combined systolic (congestive) and diastolic (congestive) heart failure: Secondary | ICD-10-CM | POA: Diagnosis not present

## 2019-03-29 DIAGNOSIS — R1310 Dysphagia, unspecified: Secondary | ICD-10-CM | POA: Diagnosis not present

## 2019-03-29 DIAGNOSIS — I4892 Unspecified atrial flutter: Secondary | ICD-10-CM | POA: Diagnosis not present

## 2019-03-29 DIAGNOSIS — K222 Esophageal obstruction: Secondary | ICD-10-CM | POA: Diagnosis not present

## 2019-03-31 DIAGNOSIS — K222 Esophageal obstruction: Secondary | ICD-10-CM | POA: Diagnosis not present

## 2019-03-31 DIAGNOSIS — R1310 Dysphagia, unspecified: Secondary | ICD-10-CM | POA: Diagnosis not present

## 2019-03-31 DIAGNOSIS — I4892 Unspecified atrial flutter: Secondary | ICD-10-CM | POA: Diagnosis not present

## 2019-03-31 DIAGNOSIS — I110 Hypertensive heart disease with heart failure: Secondary | ICD-10-CM | POA: Diagnosis not present

## 2019-03-31 DIAGNOSIS — Z9981 Dependence on supplemental oxygen: Secondary | ICD-10-CM | POA: Diagnosis not present

## 2019-03-31 DIAGNOSIS — I5042 Chronic combined systolic (congestive) and diastolic (congestive) heart failure: Secondary | ICD-10-CM | POA: Diagnosis not present

## 2019-04-01 DIAGNOSIS — R609 Edema, unspecified: Secondary | ICD-10-CM | POA: Diagnosis not present

## 2019-04-01 DIAGNOSIS — F0151 Vascular dementia with behavioral disturbance: Secondary | ICD-10-CM | POA: Diagnosis not present

## 2019-04-01 DIAGNOSIS — I509 Heart failure, unspecified: Secondary | ICD-10-CM | POA: Diagnosis not present

## 2019-04-01 DIAGNOSIS — I4892 Unspecified atrial flutter: Secondary | ICD-10-CM | POA: Diagnosis not present

## 2019-04-01 DIAGNOSIS — R1310 Dysphagia, unspecified: Secondary | ICD-10-CM | POA: Diagnosis not present

## 2019-04-01 DIAGNOSIS — I5042 Chronic combined systolic (congestive) and diastolic (congestive) heart failure: Secondary | ICD-10-CM | POA: Diagnosis not present

## 2019-04-01 DIAGNOSIS — R635 Abnormal weight gain: Secondary | ICD-10-CM | POA: Diagnosis not present

## 2019-04-01 DIAGNOSIS — Z9981 Dependence on supplemental oxygen: Secondary | ICD-10-CM | POA: Diagnosis not present

## 2019-04-01 DIAGNOSIS — K222 Esophageal obstruction: Secondary | ICD-10-CM | POA: Diagnosis not present

## 2019-04-01 DIAGNOSIS — I110 Hypertensive heart disease with heart failure: Secondary | ICD-10-CM | POA: Diagnosis not present

## 2019-04-03 DIAGNOSIS — I5042 Chronic combined systolic (congestive) and diastolic (congestive) heart failure: Secondary | ICD-10-CM | POA: Diagnosis not present

## 2019-04-03 DIAGNOSIS — K222 Esophageal obstruction: Secondary | ICD-10-CM | POA: Diagnosis not present

## 2019-04-03 DIAGNOSIS — Z9981 Dependence on supplemental oxygen: Secondary | ICD-10-CM | POA: Diagnosis not present

## 2019-04-03 DIAGNOSIS — R1310 Dysphagia, unspecified: Secondary | ICD-10-CM | POA: Diagnosis not present

## 2019-04-03 DIAGNOSIS — I110 Hypertensive heart disease with heart failure: Secondary | ICD-10-CM | POA: Diagnosis not present

## 2019-04-03 DIAGNOSIS — I4892 Unspecified atrial flutter: Secondary | ICD-10-CM | POA: Diagnosis not present

## 2019-04-04 DIAGNOSIS — K222 Esophageal obstruction: Secondary | ICD-10-CM | POA: Diagnosis not present

## 2019-04-04 DIAGNOSIS — I110 Hypertensive heart disease with heart failure: Secondary | ICD-10-CM | POA: Diagnosis not present

## 2019-04-04 DIAGNOSIS — Z9981 Dependence on supplemental oxygen: Secondary | ICD-10-CM | POA: Diagnosis not present

## 2019-04-04 DIAGNOSIS — I5042 Chronic combined systolic (congestive) and diastolic (congestive) heart failure: Secondary | ICD-10-CM | POA: Diagnosis not present

## 2019-04-04 DIAGNOSIS — R1310 Dysphagia, unspecified: Secondary | ICD-10-CM | POA: Diagnosis not present

## 2019-04-04 DIAGNOSIS — I4892 Unspecified atrial flutter: Secondary | ICD-10-CM | POA: Diagnosis not present

## 2019-04-05 DIAGNOSIS — R1310 Dysphagia, unspecified: Secondary | ICD-10-CM | POA: Diagnosis not present

## 2019-04-05 DIAGNOSIS — Z9981 Dependence on supplemental oxygen: Secondary | ICD-10-CM | POA: Diagnosis not present

## 2019-04-05 DIAGNOSIS — I4892 Unspecified atrial flutter: Secondary | ICD-10-CM | POA: Diagnosis not present

## 2019-04-05 DIAGNOSIS — K222 Esophageal obstruction: Secondary | ICD-10-CM | POA: Diagnosis not present

## 2019-04-05 DIAGNOSIS — I110 Hypertensive heart disease with heart failure: Secondary | ICD-10-CM | POA: Diagnosis not present

## 2019-04-05 DIAGNOSIS — I5042 Chronic combined systolic (congestive) and diastolic (congestive) heart failure: Secondary | ICD-10-CM | POA: Diagnosis not present

## 2019-04-10 DIAGNOSIS — Z9981 Dependence on supplemental oxygen: Secondary | ICD-10-CM | POA: Diagnosis not present

## 2019-04-10 DIAGNOSIS — I4892 Unspecified atrial flutter: Secondary | ICD-10-CM | POA: Diagnosis not present

## 2019-04-10 DIAGNOSIS — R1310 Dysphagia, unspecified: Secondary | ICD-10-CM | POA: Diagnosis not present

## 2019-04-10 DIAGNOSIS — I110 Hypertensive heart disease with heart failure: Secondary | ICD-10-CM | POA: Diagnosis not present

## 2019-04-10 DIAGNOSIS — I5042 Chronic combined systolic (congestive) and diastolic (congestive) heart failure: Secondary | ICD-10-CM | POA: Diagnosis not present

## 2019-04-10 DIAGNOSIS — K222 Esophageal obstruction: Secondary | ICD-10-CM | POA: Diagnosis not present

## 2019-04-11 DIAGNOSIS — K222 Esophageal obstruction: Secondary | ICD-10-CM | POA: Diagnosis not present

## 2019-04-11 DIAGNOSIS — I5042 Chronic combined systolic (congestive) and diastolic (congestive) heart failure: Secondary | ICD-10-CM | POA: Diagnosis not present

## 2019-04-11 DIAGNOSIS — I110 Hypertensive heart disease with heart failure: Secondary | ICD-10-CM | POA: Diagnosis not present

## 2019-04-11 DIAGNOSIS — Z9981 Dependence on supplemental oxygen: Secondary | ICD-10-CM | POA: Diagnosis not present

## 2019-04-11 DIAGNOSIS — I4892 Unspecified atrial flutter: Secondary | ICD-10-CM | POA: Diagnosis not present

## 2019-04-11 DIAGNOSIS — R1310 Dysphagia, unspecified: Secondary | ICD-10-CM | POA: Diagnosis not present

## 2019-04-12 DIAGNOSIS — K222 Esophageal obstruction: Secondary | ICD-10-CM | POA: Diagnosis not present

## 2019-04-12 DIAGNOSIS — I4892 Unspecified atrial flutter: Secondary | ICD-10-CM | POA: Diagnosis not present

## 2019-04-12 DIAGNOSIS — Z9981 Dependence on supplemental oxygen: Secondary | ICD-10-CM | POA: Diagnosis not present

## 2019-04-12 DIAGNOSIS — I110 Hypertensive heart disease with heart failure: Secondary | ICD-10-CM | POA: Diagnosis not present

## 2019-04-12 DIAGNOSIS — I5042 Chronic combined systolic (congestive) and diastolic (congestive) heart failure: Secondary | ICD-10-CM | POA: Diagnosis not present

## 2019-04-12 DIAGNOSIS — R1310 Dysphagia, unspecified: Secondary | ICD-10-CM | POA: Diagnosis not present

## 2019-04-16 DIAGNOSIS — I4892 Unspecified atrial flutter: Secondary | ICD-10-CM | POA: Diagnosis not present

## 2019-04-16 DIAGNOSIS — Z9981 Dependence on supplemental oxygen: Secondary | ICD-10-CM | POA: Diagnosis not present

## 2019-04-16 DIAGNOSIS — R1310 Dysphagia, unspecified: Secondary | ICD-10-CM | POA: Diagnosis not present

## 2019-04-16 DIAGNOSIS — K222 Esophageal obstruction: Secondary | ICD-10-CM | POA: Diagnosis not present

## 2019-04-16 DIAGNOSIS — I110 Hypertensive heart disease with heart failure: Secondary | ICD-10-CM | POA: Diagnosis not present

## 2019-04-16 DIAGNOSIS — I5042 Chronic combined systolic (congestive) and diastolic (congestive) heart failure: Secondary | ICD-10-CM | POA: Diagnosis not present

## 2019-04-17 DIAGNOSIS — I4892 Unspecified atrial flutter: Secondary | ICD-10-CM | POA: Diagnosis not present

## 2019-04-17 DIAGNOSIS — I110 Hypertensive heart disease with heart failure: Secondary | ICD-10-CM | POA: Diagnosis not present

## 2019-04-17 DIAGNOSIS — R1310 Dysphagia, unspecified: Secondary | ICD-10-CM | POA: Diagnosis not present

## 2019-04-17 DIAGNOSIS — K222 Esophageal obstruction: Secondary | ICD-10-CM | POA: Diagnosis not present

## 2019-04-17 DIAGNOSIS — I5042 Chronic combined systolic (congestive) and diastolic (congestive) heart failure: Secondary | ICD-10-CM | POA: Diagnosis not present

## 2019-04-17 DIAGNOSIS — Z9981 Dependence on supplemental oxygen: Secondary | ICD-10-CM | POA: Diagnosis not present

## 2019-04-19 DIAGNOSIS — K222 Esophageal obstruction: Secondary | ICD-10-CM | POA: Diagnosis not present

## 2019-04-19 DIAGNOSIS — I110 Hypertensive heart disease with heart failure: Secondary | ICD-10-CM | POA: Diagnosis not present

## 2019-04-19 DIAGNOSIS — I5042 Chronic combined systolic (congestive) and diastolic (congestive) heart failure: Secondary | ICD-10-CM | POA: Diagnosis not present

## 2019-04-19 DIAGNOSIS — I4892 Unspecified atrial flutter: Secondary | ICD-10-CM | POA: Diagnosis not present

## 2019-04-19 DIAGNOSIS — Z9981 Dependence on supplemental oxygen: Secondary | ICD-10-CM | POA: Diagnosis not present

## 2019-04-19 DIAGNOSIS — R1310 Dysphagia, unspecified: Secondary | ICD-10-CM | POA: Diagnosis not present

## 2019-04-20 DIAGNOSIS — K222 Esophageal obstruction: Secondary | ICD-10-CM | POA: Diagnosis not present

## 2019-04-20 DIAGNOSIS — I4892 Unspecified atrial flutter: Secondary | ICD-10-CM | POA: Diagnosis not present

## 2019-04-20 DIAGNOSIS — R1310 Dysphagia, unspecified: Secondary | ICD-10-CM | POA: Diagnosis not present

## 2019-04-20 DIAGNOSIS — I110 Hypertensive heart disease with heart failure: Secondary | ICD-10-CM | POA: Diagnosis not present

## 2019-04-20 DIAGNOSIS — Z9981 Dependence on supplemental oxygen: Secondary | ICD-10-CM | POA: Diagnosis not present

## 2019-04-20 DIAGNOSIS — I5042 Chronic combined systolic (congestive) and diastolic (congestive) heart failure: Secondary | ICD-10-CM | POA: Diagnosis not present

## 2019-04-21 DIAGNOSIS — I4892 Unspecified atrial flutter: Secondary | ICD-10-CM | POA: Diagnosis not present

## 2019-04-21 DIAGNOSIS — K222 Esophageal obstruction: Secondary | ICD-10-CM | POA: Diagnosis not present

## 2019-04-21 DIAGNOSIS — Z9981 Dependence on supplemental oxygen: Secondary | ICD-10-CM | POA: Diagnosis not present

## 2019-04-21 DIAGNOSIS — I5042 Chronic combined systolic (congestive) and diastolic (congestive) heart failure: Secondary | ICD-10-CM | POA: Diagnosis not present

## 2019-04-21 DIAGNOSIS — R1310 Dysphagia, unspecified: Secondary | ICD-10-CM | POA: Diagnosis not present

## 2019-04-21 DIAGNOSIS — I110 Hypertensive heart disease with heart failure: Secondary | ICD-10-CM | POA: Diagnosis not present

## 2019-04-25 DIAGNOSIS — R1310 Dysphagia, unspecified: Secondary | ICD-10-CM | POA: Diagnosis not present

## 2019-04-25 DIAGNOSIS — I4892 Unspecified atrial flutter: Secondary | ICD-10-CM | POA: Diagnosis not present

## 2019-04-25 DIAGNOSIS — I110 Hypertensive heart disease with heart failure: Secondary | ICD-10-CM | POA: Diagnosis not present

## 2019-04-25 DIAGNOSIS — Z9981 Dependence on supplemental oxygen: Secondary | ICD-10-CM | POA: Diagnosis not present

## 2019-04-25 DIAGNOSIS — I5042 Chronic combined systolic (congestive) and diastolic (congestive) heart failure: Secondary | ICD-10-CM | POA: Diagnosis not present

## 2019-04-25 DIAGNOSIS — K222 Esophageal obstruction: Secondary | ICD-10-CM | POA: Diagnosis not present

## 2019-04-27 DIAGNOSIS — G40909 Epilepsy, unspecified, not intractable, without status epilepticus: Secondary | ICD-10-CM | POA: Diagnosis not present

## 2019-04-27 DIAGNOSIS — J449 Chronic obstructive pulmonary disease, unspecified: Secondary | ICD-10-CM | POA: Diagnosis not present

## 2019-04-27 DIAGNOSIS — I2510 Atherosclerotic heart disease of native coronary artery without angina pectoris: Secondary | ICD-10-CM | POA: Diagnosis not present

## 2019-04-27 DIAGNOSIS — K222 Esophageal obstruction: Secondary | ICD-10-CM | POA: Diagnosis not present

## 2019-04-27 DIAGNOSIS — I110 Hypertensive heart disease with heart failure: Secondary | ICD-10-CM | POA: Diagnosis not present

## 2019-04-27 DIAGNOSIS — R1310 Dysphagia, unspecified: Secondary | ICD-10-CM | POA: Diagnosis not present

## 2019-04-27 DIAGNOSIS — Z951 Presence of aortocoronary bypass graft: Secondary | ICD-10-CM | POA: Diagnosis not present

## 2019-04-27 DIAGNOSIS — F0390 Unspecified dementia without behavioral disturbance: Secondary | ICD-10-CM | POA: Diagnosis not present

## 2019-04-27 DIAGNOSIS — G4733 Obstructive sleep apnea (adult) (pediatric): Secondary | ICD-10-CM | POA: Diagnosis not present

## 2019-04-27 DIAGNOSIS — I4892 Unspecified atrial flutter: Secondary | ICD-10-CM | POA: Diagnosis not present

## 2019-04-27 DIAGNOSIS — Z8673 Personal history of transient ischemic attack (TIA), and cerebral infarction without residual deficits: Secondary | ICD-10-CM | POA: Diagnosis not present

## 2019-04-27 DIAGNOSIS — Z86711 Personal history of pulmonary embolism: Secondary | ICD-10-CM | POA: Diagnosis not present

## 2019-04-27 DIAGNOSIS — I5042 Chronic combined systolic (congestive) and diastolic (congestive) heart failure: Secondary | ICD-10-CM | POA: Diagnosis not present

## 2019-04-27 DIAGNOSIS — Z9981 Dependence on supplemental oxygen: Secondary | ICD-10-CM | POA: Diagnosis not present

## 2019-04-27 DIAGNOSIS — Z77098 Contact with and (suspected) exposure to other hazardous, chiefly nonmedicinal, chemicals: Secondary | ICD-10-CM | POA: Diagnosis not present

## 2019-04-27 DIAGNOSIS — Z77090 Contact with and (suspected) exposure to asbestos: Secondary | ICD-10-CM | POA: Diagnosis not present

## 2019-05-03 DIAGNOSIS — I110 Hypertensive heart disease with heart failure: Secondary | ICD-10-CM | POA: Diagnosis not present

## 2019-05-03 DIAGNOSIS — K222 Esophageal obstruction: Secondary | ICD-10-CM | POA: Diagnosis not present

## 2019-05-03 DIAGNOSIS — I4892 Unspecified atrial flutter: Secondary | ICD-10-CM | POA: Diagnosis not present

## 2019-05-03 DIAGNOSIS — Z9981 Dependence on supplemental oxygen: Secondary | ICD-10-CM | POA: Diagnosis not present

## 2019-05-03 DIAGNOSIS — I5042 Chronic combined systolic (congestive) and diastolic (congestive) heart failure: Secondary | ICD-10-CM | POA: Diagnosis not present

## 2019-05-03 DIAGNOSIS — R1310 Dysphagia, unspecified: Secondary | ICD-10-CM | POA: Diagnosis not present

## 2019-05-04 DIAGNOSIS — I5042 Chronic combined systolic (congestive) and diastolic (congestive) heart failure: Secondary | ICD-10-CM | POA: Diagnosis not present

## 2019-05-04 DIAGNOSIS — I4892 Unspecified atrial flutter: Secondary | ICD-10-CM | POA: Diagnosis not present

## 2019-05-04 DIAGNOSIS — K222 Esophageal obstruction: Secondary | ICD-10-CM | POA: Diagnosis not present

## 2019-05-04 DIAGNOSIS — Z9981 Dependence on supplemental oxygen: Secondary | ICD-10-CM | POA: Diagnosis not present

## 2019-05-04 DIAGNOSIS — R1310 Dysphagia, unspecified: Secondary | ICD-10-CM | POA: Diagnosis not present

## 2019-05-04 DIAGNOSIS — I110 Hypertensive heart disease with heart failure: Secondary | ICD-10-CM | POA: Diagnosis not present

## 2019-05-06 DIAGNOSIS — K222 Esophageal obstruction: Secondary | ICD-10-CM | POA: Diagnosis not present

## 2019-05-06 DIAGNOSIS — Z9981 Dependence on supplemental oxygen: Secondary | ICD-10-CM | POA: Diagnosis not present

## 2019-05-06 DIAGNOSIS — I4892 Unspecified atrial flutter: Secondary | ICD-10-CM | POA: Diagnosis not present

## 2019-05-06 DIAGNOSIS — I5042 Chronic combined systolic (congestive) and diastolic (congestive) heart failure: Secondary | ICD-10-CM | POA: Diagnosis not present

## 2019-05-06 DIAGNOSIS — R1310 Dysphagia, unspecified: Secondary | ICD-10-CM | POA: Diagnosis not present

## 2019-05-06 DIAGNOSIS — I110 Hypertensive heart disease with heart failure: Secondary | ICD-10-CM | POA: Diagnosis not present

## 2019-05-08 DIAGNOSIS — Z9981 Dependence on supplemental oxygen: Secondary | ICD-10-CM | POA: Diagnosis not present

## 2019-05-08 DIAGNOSIS — I4892 Unspecified atrial flutter: Secondary | ICD-10-CM | POA: Diagnosis not present

## 2019-05-08 DIAGNOSIS — I5042 Chronic combined systolic (congestive) and diastolic (congestive) heart failure: Secondary | ICD-10-CM | POA: Diagnosis not present

## 2019-05-08 DIAGNOSIS — R1310 Dysphagia, unspecified: Secondary | ICD-10-CM | POA: Diagnosis not present

## 2019-05-08 DIAGNOSIS — I110 Hypertensive heart disease with heart failure: Secondary | ICD-10-CM | POA: Diagnosis not present

## 2019-05-08 DIAGNOSIS — K222 Esophageal obstruction: Secondary | ICD-10-CM | POA: Diagnosis not present

## 2019-05-12 DIAGNOSIS — I5042 Chronic combined systolic (congestive) and diastolic (congestive) heart failure: Secondary | ICD-10-CM | POA: Diagnosis not present

## 2019-05-12 DIAGNOSIS — R1310 Dysphagia, unspecified: Secondary | ICD-10-CM | POA: Diagnosis not present

## 2019-05-12 DIAGNOSIS — I110 Hypertensive heart disease with heart failure: Secondary | ICD-10-CM | POA: Diagnosis not present

## 2019-05-12 DIAGNOSIS — I4892 Unspecified atrial flutter: Secondary | ICD-10-CM | POA: Diagnosis not present

## 2019-05-12 DIAGNOSIS — Z9981 Dependence on supplemental oxygen: Secondary | ICD-10-CM | POA: Diagnosis not present

## 2019-05-12 DIAGNOSIS — K222 Esophageal obstruction: Secondary | ICD-10-CM | POA: Diagnosis not present

## 2019-05-13 DIAGNOSIS — K222 Esophageal obstruction: Secondary | ICD-10-CM | POA: Diagnosis not present

## 2019-05-13 DIAGNOSIS — I110 Hypertensive heart disease with heart failure: Secondary | ICD-10-CM | POA: Diagnosis not present

## 2019-05-13 DIAGNOSIS — Z9981 Dependence on supplemental oxygen: Secondary | ICD-10-CM | POA: Diagnosis not present

## 2019-05-13 DIAGNOSIS — I5042 Chronic combined systolic (congestive) and diastolic (congestive) heart failure: Secondary | ICD-10-CM | POA: Diagnosis not present

## 2019-05-13 DIAGNOSIS — R1310 Dysphagia, unspecified: Secondary | ICD-10-CM | POA: Diagnosis not present

## 2019-05-13 DIAGNOSIS — I4892 Unspecified atrial flutter: Secondary | ICD-10-CM | POA: Diagnosis not present

## 2019-05-18 DIAGNOSIS — I110 Hypertensive heart disease with heart failure: Secondary | ICD-10-CM | POA: Diagnosis not present

## 2019-05-18 DIAGNOSIS — Z9981 Dependence on supplemental oxygen: Secondary | ICD-10-CM | POA: Diagnosis not present

## 2019-05-18 DIAGNOSIS — R1310 Dysphagia, unspecified: Secondary | ICD-10-CM | POA: Diagnosis not present

## 2019-05-18 DIAGNOSIS — I5042 Chronic combined systolic (congestive) and diastolic (congestive) heart failure: Secondary | ICD-10-CM | POA: Diagnosis not present

## 2019-05-18 DIAGNOSIS — I4892 Unspecified atrial flutter: Secondary | ICD-10-CM | POA: Diagnosis not present

## 2019-05-18 DIAGNOSIS — K222 Esophageal obstruction: Secondary | ICD-10-CM | POA: Diagnosis not present

## 2019-05-20 DIAGNOSIS — I110 Hypertensive heart disease with heart failure: Secondary | ICD-10-CM | POA: Diagnosis not present

## 2019-05-20 DIAGNOSIS — I4892 Unspecified atrial flutter: Secondary | ICD-10-CM | POA: Diagnosis not present

## 2019-05-20 DIAGNOSIS — I5042 Chronic combined systolic (congestive) and diastolic (congestive) heart failure: Secondary | ICD-10-CM | POA: Diagnosis not present

## 2019-05-20 DIAGNOSIS — R1310 Dysphagia, unspecified: Secondary | ICD-10-CM | POA: Diagnosis not present

## 2019-05-20 DIAGNOSIS — Z9981 Dependence on supplemental oxygen: Secondary | ICD-10-CM | POA: Diagnosis not present

## 2019-05-20 DIAGNOSIS — K222 Esophageal obstruction: Secondary | ICD-10-CM | POA: Diagnosis not present

## 2019-05-21 DIAGNOSIS — I4892 Unspecified atrial flutter: Secondary | ICD-10-CM | POA: Diagnosis not present

## 2019-05-21 DIAGNOSIS — I5042 Chronic combined systolic (congestive) and diastolic (congestive) heart failure: Secondary | ICD-10-CM | POA: Diagnosis not present

## 2019-05-21 DIAGNOSIS — I110 Hypertensive heart disease with heart failure: Secondary | ICD-10-CM | POA: Diagnosis not present

## 2019-05-21 DIAGNOSIS — K222 Esophageal obstruction: Secondary | ICD-10-CM | POA: Diagnosis not present

## 2019-05-21 DIAGNOSIS — R1310 Dysphagia, unspecified: Secondary | ICD-10-CM | POA: Diagnosis not present

## 2019-05-21 DIAGNOSIS — Z9981 Dependence on supplemental oxygen: Secondary | ICD-10-CM | POA: Diagnosis not present

## 2019-05-22 DIAGNOSIS — K222 Esophageal obstruction: Secondary | ICD-10-CM | POA: Diagnosis not present

## 2019-05-22 DIAGNOSIS — I4892 Unspecified atrial flutter: Secondary | ICD-10-CM | POA: Diagnosis not present

## 2019-05-22 DIAGNOSIS — I5042 Chronic combined systolic (congestive) and diastolic (congestive) heart failure: Secondary | ICD-10-CM | POA: Diagnosis not present

## 2019-05-22 DIAGNOSIS — I110 Hypertensive heart disease with heart failure: Secondary | ICD-10-CM | POA: Diagnosis not present

## 2019-05-22 DIAGNOSIS — R1310 Dysphagia, unspecified: Secondary | ICD-10-CM | POA: Diagnosis not present

## 2019-05-22 DIAGNOSIS — Z9981 Dependence on supplemental oxygen: Secondary | ICD-10-CM | POA: Diagnosis not present

## 2019-05-26 DIAGNOSIS — Z9981 Dependence on supplemental oxygen: Secondary | ICD-10-CM | POA: Diagnosis not present

## 2019-05-26 DIAGNOSIS — I5042 Chronic combined systolic (congestive) and diastolic (congestive) heart failure: Secondary | ICD-10-CM | POA: Diagnosis not present

## 2019-05-26 DIAGNOSIS — I110 Hypertensive heart disease with heart failure: Secondary | ICD-10-CM | POA: Diagnosis not present

## 2019-05-26 DIAGNOSIS — R1310 Dysphagia, unspecified: Secondary | ICD-10-CM | POA: Diagnosis not present

## 2019-05-26 DIAGNOSIS — I4892 Unspecified atrial flutter: Secondary | ICD-10-CM | POA: Diagnosis not present

## 2019-05-26 DIAGNOSIS — K222 Esophageal obstruction: Secondary | ICD-10-CM | POA: Diagnosis not present

## 2019-05-27 DIAGNOSIS — Z8673 Personal history of transient ischemic attack (TIA), and cerebral infarction without residual deficits: Secondary | ICD-10-CM | POA: Diagnosis not present

## 2019-05-27 DIAGNOSIS — Z951 Presence of aortocoronary bypass graft: Secondary | ICD-10-CM | POA: Diagnosis not present

## 2019-05-27 DIAGNOSIS — Z9981 Dependence on supplemental oxygen: Secondary | ICD-10-CM | POA: Diagnosis not present

## 2019-05-27 DIAGNOSIS — R1310 Dysphagia, unspecified: Secondary | ICD-10-CM | POA: Diagnosis not present

## 2019-05-27 DIAGNOSIS — K222 Esophageal obstruction: Secondary | ICD-10-CM | POA: Diagnosis not present

## 2019-05-27 DIAGNOSIS — F0390 Unspecified dementia without behavioral disturbance: Secondary | ICD-10-CM | POA: Diagnosis not present

## 2019-05-27 DIAGNOSIS — I4892 Unspecified atrial flutter: Secondary | ICD-10-CM | POA: Diagnosis not present

## 2019-05-27 DIAGNOSIS — G4733 Obstructive sleep apnea (adult) (pediatric): Secondary | ICD-10-CM | POA: Diagnosis not present

## 2019-05-27 DIAGNOSIS — G40909 Epilepsy, unspecified, not intractable, without status epilepticus: Secondary | ICD-10-CM | POA: Diagnosis not present

## 2019-05-27 DIAGNOSIS — I2510 Atherosclerotic heart disease of native coronary artery without angina pectoris: Secondary | ICD-10-CM | POA: Diagnosis not present

## 2019-05-27 DIAGNOSIS — Z77090 Contact with and (suspected) exposure to asbestos: Secondary | ICD-10-CM | POA: Diagnosis not present

## 2019-05-27 DIAGNOSIS — J449 Chronic obstructive pulmonary disease, unspecified: Secondary | ICD-10-CM | POA: Diagnosis not present

## 2019-05-27 DIAGNOSIS — Z86711 Personal history of pulmonary embolism: Secondary | ICD-10-CM | POA: Diagnosis not present

## 2019-05-27 DIAGNOSIS — I5042 Chronic combined systolic (congestive) and diastolic (congestive) heart failure: Secondary | ICD-10-CM | POA: Diagnosis not present

## 2019-05-27 DIAGNOSIS — Z77098 Contact with and (suspected) exposure to other hazardous, chiefly nonmedicinal, chemicals: Secondary | ICD-10-CM | POA: Diagnosis not present

## 2019-05-27 DIAGNOSIS — I110 Hypertensive heart disease with heart failure: Secondary | ICD-10-CM | POA: Diagnosis not present

## 2019-05-28 DIAGNOSIS — I5042 Chronic combined systolic (congestive) and diastolic (congestive) heart failure: Secondary | ICD-10-CM | POA: Diagnosis not present

## 2019-05-28 DIAGNOSIS — Z9981 Dependence on supplemental oxygen: Secondary | ICD-10-CM | POA: Diagnosis not present

## 2019-05-28 DIAGNOSIS — K222 Esophageal obstruction: Secondary | ICD-10-CM | POA: Diagnosis not present

## 2019-05-28 DIAGNOSIS — R1310 Dysphagia, unspecified: Secondary | ICD-10-CM | POA: Diagnosis not present

## 2019-05-28 DIAGNOSIS — I4892 Unspecified atrial flutter: Secondary | ICD-10-CM | POA: Diagnosis not present

## 2019-05-28 DIAGNOSIS — I110 Hypertensive heart disease with heart failure: Secondary | ICD-10-CM | POA: Diagnosis not present

## 2019-05-30 DIAGNOSIS — K222 Esophageal obstruction: Secondary | ICD-10-CM | POA: Diagnosis not present

## 2019-05-30 DIAGNOSIS — R1310 Dysphagia, unspecified: Secondary | ICD-10-CM | POA: Diagnosis not present

## 2019-05-30 DIAGNOSIS — I4892 Unspecified atrial flutter: Secondary | ICD-10-CM | POA: Diagnosis not present

## 2019-05-30 DIAGNOSIS — I5042 Chronic combined systolic (congestive) and diastolic (congestive) heart failure: Secondary | ICD-10-CM | POA: Diagnosis not present

## 2019-05-30 DIAGNOSIS — Z9981 Dependence on supplemental oxygen: Secondary | ICD-10-CM | POA: Diagnosis not present

## 2019-05-30 DIAGNOSIS — I110 Hypertensive heart disease with heart failure: Secondary | ICD-10-CM | POA: Diagnosis not present

## 2019-05-31 DIAGNOSIS — Z9981 Dependence on supplemental oxygen: Secondary | ICD-10-CM | POA: Diagnosis not present

## 2019-05-31 DIAGNOSIS — I5042 Chronic combined systolic (congestive) and diastolic (congestive) heart failure: Secondary | ICD-10-CM | POA: Diagnosis not present

## 2019-05-31 DIAGNOSIS — I4892 Unspecified atrial flutter: Secondary | ICD-10-CM | POA: Diagnosis not present

## 2019-05-31 DIAGNOSIS — I110 Hypertensive heart disease with heart failure: Secondary | ICD-10-CM | POA: Diagnosis not present

## 2019-05-31 DIAGNOSIS — R1310 Dysphagia, unspecified: Secondary | ICD-10-CM | POA: Diagnosis not present

## 2019-05-31 DIAGNOSIS — K222 Esophageal obstruction: Secondary | ICD-10-CM | POA: Diagnosis not present

## 2019-06-01 DIAGNOSIS — I4892 Unspecified atrial flutter: Secondary | ICD-10-CM | POA: Diagnosis not present

## 2019-06-01 DIAGNOSIS — Z9981 Dependence on supplemental oxygen: Secondary | ICD-10-CM | POA: Diagnosis not present

## 2019-06-01 DIAGNOSIS — K222 Esophageal obstruction: Secondary | ICD-10-CM | POA: Diagnosis not present

## 2019-06-01 DIAGNOSIS — I5042 Chronic combined systolic (congestive) and diastolic (congestive) heart failure: Secondary | ICD-10-CM | POA: Diagnosis not present

## 2019-06-01 DIAGNOSIS — R1310 Dysphagia, unspecified: Secondary | ICD-10-CM | POA: Diagnosis not present

## 2019-06-01 DIAGNOSIS — I110 Hypertensive heart disease with heart failure: Secondary | ICD-10-CM | POA: Diagnosis not present

## 2019-06-02 DIAGNOSIS — I5042 Chronic combined systolic (congestive) and diastolic (congestive) heart failure: Secondary | ICD-10-CM | POA: Diagnosis not present

## 2019-06-02 DIAGNOSIS — K222 Esophageal obstruction: Secondary | ICD-10-CM | POA: Diagnosis not present

## 2019-06-02 DIAGNOSIS — Z9981 Dependence on supplemental oxygen: Secondary | ICD-10-CM | POA: Diagnosis not present

## 2019-06-02 DIAGNOSIS — R1310 Dysphagia, unspecified: Secondary | ICD-10-CM | POA: Diagnosis not present

## 2019-06-02 DIAGNOSIS — I4892 Unspecified atrial flutter: Secondary | ICD-10-CM | POA: Diagnosis not present

## 2019-06-02 DIAGNOSIS — I110 Hypertensive heart disease with heart failure: Secondary | ICD-10-CM | POA: Diagnosis not present

## 2019-06-03 DIAGNOSIS — Z9981 Dependence on supplemental oxygen: Secondary | ICD-10-CM | POA: Diagnosis not present

## 2019-06-03 DIAGNOSIS — I5042 Chronic combined systolic (congestive) and diastolic (congestive) heart failure: Secondary | ICD-10-CM | POA: Diagnosis not present

## 2019-06-03 DIAGNOSIS — I4892 Unspecified atrial flutter: Secondary | ICD-10-CM | POA: Diagnosis not present

## 2019-06-03 DIAGNOSIS — I110 Hypertensive heart disease with heart failure: Secondary | ICD-10-CM | POA: Diagnosis not present

## 2019-06-03 DIAGNOSIS — K222 Esophageal obstruction: Secondary | ICD-10-CM | POA: Diagnosis not present

## 2019-06-03 DIAGNOSIS — R1310 Dysphagia, unspecified: Secondary | ICD-10-CM | POA: Diagnosis not present

## 2019-06-09 DIAGNOSIS — I5042 Chronic combined systolic (congestive) and diastolic (congestive) heart failure: Secondary | ICD-10-CM | POA: Diagnosis not present

## 2019-06-09 DIAGNOSIS — Z9981 Dependence on supplemental oxygen: Secondary | ICD-10-CM | POA: Diagnosis not present

## 2019-06-09 DIAGNOSIS — R1310 Dysphagia, unspecified: Secondary | ICD-10-CM | POA: Diagnosis not present

## 2019-06-09 DIAGNOSIS — K222 Esophageal obstruction: Secondary | ICD-10-CM | POA: Diagnosis not present

## 2019-06-09 DIAGNOSIS — I110 Hypertensive heart disease with heart failure: Secondary | ICD-10-CM | POA: Diagnosis not present

## 2019-06-09 DIAGNOSIS — I4892 Unspecified atrial flutter: Secondary | ICD-10-CM | POA: Diagnosis not present

## 2019-06-10 DIAGNOSIS — I110 Hypertensive heart disease with heart failure: Secondary | ICD-10-CM | POA: Diagnosis not present

## 2019-06-10 DIAGNOSIS — I4892 Unspecified atrial flutter: Secondary | ICD-10-CM | POA: Diagnosis not present

## 2019-06-10 DIAGNOSIS — I5042 Chronic combined systolic (congestive) and diastolic (congestive) heart failure: Secondary | ICD-10-CM | POA: Diagnosis not present

## 2019-06-10 DIAGNOSIS — R1310 Dysphagia, unspecified: Secondary | ICD-10-CM | POA: Diagnosis not present

## 2019-06-10 DIAGNOSIS — Z9981 Dependence on supplemental oxygen: Secondary | ICD-10-CM | POA: Diagnosis not present

## 2019-06-10 DIAGNOSIS — K222 Esophageal obstruction: Secondary | ICD-10-CM | POA: Diagnosis not present

## 2019-06-11 DIAGNOSIS — I5042 Chronic combined systolic (congestive) and diastolic (congestive) heart failure: Secondary | ICD-10-CM | POA: Diagnosis not present

## 2019-06-11 DIAGNOSIS — R1310 Dysphagia, unspecified: Secondary | ICD-10-CM | POA: Diagnosis not present

## 2019-06-11 DIAGNOSIS — I4892 Unspecified atrial flutter: Secondary | ICD-10-CM | POA: Diagnosis not present

## 2019-06-11 DIAGNOSIS — K222 Esophageal obstruction: Secondary | ICD-10-CM | POA: Diagnosis not present

## 2019-06-11 DIAGNOSIS — I110 Hypertensive heart disease with heart failure: Secondary | ICD-10-CM | POA: Diagnosis not present

## 2019-06-11 DIAGNOSIS — Z9981 Dependence on supplemental oxygen: Secondary | ICD-10-CM | POA: Diagnosis not present

## 2019-06-14 DIAGNOSIS — K222 Esophageal obstruction: Secondary | ICD-10-CM | POA: Diagnosis not present

## 2019-06-14 DIAGNOSIS — I4892 Unspecified atrial flutter: Secondary | ICD-10-CM | POA: Diagnosis not present

## 2019-06-14 DIAGNOSIS — Z9981 Dependence on supplemental oxygen: Secondary | ICD-10-CM | POA: Diagnosis not present

## 2019-06-14 DIAGNOSIS — R1310 Dysphagia, unspecified: Secondary | ICD-10-CM | POA: Diagnosis not present

## 2019-06-14 DIAGNOSIS — I110 Hypertensive heart disease with heart failure: Secondary | ICD-10-CM | POA: Diagnosis not present

## 2019-06-14 DIAGNOSIS — I5042 Chronic combined systolic (congestive) and diastolic (congestive) heart failure: Secondary | ICD-10-CM | POA: Diagnosis not present

## 2019-06-15 DIAGNOSIS — K222 Esophageal obstruction: Secondary | ICD-10-CM | POA: Diagnosis not present

## 2019-06-15 DIAGNOSIS — I5042 Chronic combined systolic (congestive) and diastolic (congestive) heart failure: Secondary | ICD-10-CM | POA: Diagnosis not present

## 2019-06-15 DIAGNOSIS — I4892 Unspecified atrial flutter: Secondary | ICD-10-CM | POA: Diagnosis not present

## 2019-06-15 DIAGNOSIS — Z9981 Dependence on supplemental oxygen: Secondary | ICD-10-CM | POA: Diagnosis not present

## 2019-06-15 DIAGNOSIS — R1310 Dysphagia, unspecified: Secondary | ICD-10-CM | POA: Diagnosis not present

## 2019-06-15 DIAGNOSIS — I110 Hypertensive heart disease with heart failure: Secondary | ICD-10-CM | POA: Diagnosis not present

## 2019-06-16 DIAGNOSIS — I4892 Unspecified atrial flutter: Secondary | ICD-10-CM | POA: Diagnosis not present

## 2019-06-16 DIAGNOSIS — I110 Hypertensive heart disease with heart failure: Secondary | ICD-10-CM | POA: Diagnosis not present

## 2019-06-16 DIAGNOSIS — R1310 Dysphagia, unspecified: Secondary | ICD-10-CM | POA: Diagnosis not present

## 2019-06-16 DIAGNOSIS — Z9981 Dependence on supplemental oxygen: Secondary | ICD-10-CM | POA: Diagnosis not present

## 2019-06-16 DIAGNOSIS — K222 Esophageal obstruction: Secondary | ICD-10-CM | POA: Diagnosis not present

## 2019-06-16 DIAGNOSIS — I5042 Chronic combined systolic (congestive) and diastolic (congestive) heart failure: Secondary | ICD-10-CM | POA: Diagnosis not present

## 2019-06-17 DIAGNOSIS — I5042 Chronic combined systolic (congestive) and diastolic (congestive) heart failure: Secondary | ICD-10-CM | POA: Diagnosis not present

## 2019-06-17 DIAGNOSIS — I4892 Unspecified atrial flutter: Secondary | ICD-10-CM | POA: Diagnosis not present

## 2019-06-17 DIAGNOSIS — R1310 Dysphagia, unspecified: Secondary | ICD-10-CM | POA: Diagnosis not present

## 2019-06-17 DIAGNOSIS — K222 Esophageal obstruction: Secondary | ICD-10-CM | POA: Diagnosis not present

## 2019-06-17 DIAGNOSIS — Z9981 Dependence on supplemental oxygen: Secondary | ICD-10-CM | POA: Diagnosis not present

## 2019-06-17 DIAGNOSIS — I110 Hypertensive heart disease with heart failure: Secondary | ICD-10-CM | POA: Diagnosis not present

## 2019-06-21 DIAGNOSIS — I5042 Chronic combined systolic (congestive) and diastolic (congestive) heart failure: Secondary | ICD-10-CM | POA: Diagnosis not present

## 2019-06-21 DIAGNOSIS — F429 Obsessive-compulsive disorder, unspecified: Secondary | ICD-10-CM | POA: Diagnosis not present

## 2019-06-21 DIAGNOSIS — F0391 Unspecified dementia with behavioral disturbance: Secondary | ICD-10-CM | POA: Diagnosis not present

## 2019-06-21 DIAGNOSIS — I509 Heart failure, unspecified: Secondary | ICD-10-CM | POA: Diagnosis not present

## 2019-06-21 DIAGNOSIS — Z9981 Dependence on supplemental oxygen: Secondary | ICD-10-CM | POA: Diagnosis not present

## 2019-06-21 DIAGNOSIS — R1310 Dysphagia, unspecified: Secondary | ICD-10-CM | POA: Diagnosis not present

## 2019-06-21 DIAGNOSIS — I482 Chronic atrial fibrillation, unspecified: Secondary | ICD-10-CM | POA: Diagnosis not present

## 2019-06-21 DIAGNOSIS — I4892 Unspecified atrial flutter: Secondary | ICD-10-CM | POA: Diagnosis not present

## 2019-06-21 DIAGNOSIS — K222 Esophageal obstruction: Secondary | ICD-10-CM | POA: Diagnosis not present

## 2019-06-21 DIAGNOSIS — I110 Hypertensive heart disease with heart failure: Secondary | ICD-10-CM | POA: Diagnosis not present

## 2019-06-22 DIAGNOSIS — I110 Hypertensive heart disease with heart failure: Secondary | ICD-10-CM | POA: Diagnosis not present

## 2019-06-22 DIAGNOSIS — Z9981 Dependence on supplemental oxygen: Secondary | ICD-10-CM | POA: Diagnosis not present

## 2019-06-22 DIAGNOSIS — I4892 Unspecified atrial flutter: Secondary | ICD-10-CM | POA: Diagnosis not present

## 2019-06-22 DIAGNOSIS — K222 Esophageal obstruction: Secondary | ICD-10-CM | POA: Diagnosis not present

## 2019-06-22 DIAGNOSIS — R1310 Dysphagia, unspecified: Secondary | ICD-10-CM | POA: Diagnosis not present

## 2019-06-22 DIAGNOSIS — I5042 Chronic combined systolic (congestive) and diastolic (congestive) heart failure: Secondary | ICD-10-CM | POA: Diagnosis not present

## 2019-06-23 DIAGNOSIS — K222 Esophageal obstruction: Secondary | ICD-10-CM | POA: Diagnosis not present

## 2019-06-23 DIAGNOSIS — I5042 Chronic combined systolic (congestive) and diastolic (congestive) heart failure: Secondary | ICD-10-CM | POA: Diagnosis not present

## 2019-06-23 DIAGNOSIS — Z9981 Dependence on supplemental oxygen: Secondary | ICD-10-CM | POA: Diagnosis not present

## 2019-06-23 DIAGNOSIS — I4892 Unspecified atrial flutter: Secondary | ICD-10-CM | POA: Diagnosis not present

## 2019-06-23 DIAGNOSIS — I110 Hypertensive heart disease with heart failure: Secondary | ICD-10-CM | POA: Diagnosis not present

## 2019-06-23 DIAGNOSIS — R1310 Dysphagia, unspecified: Secondary | ICD-10-CM | POA: Diagnosis not present

## 2019-06-24 DIAGNOSIS — K222 Esophageal obstruction: Secondary | ICD-10-CM | POA: Diagnosis not present

## 2019-06-24 DIAGNOSIS — I4892 Unspecified atrial flutter: Secondary | ICD-10-CM | POA: Diagnosis not present

## 2019-06-24 DIAGNOSIS — R1310 Dysphagia, unspecified: Secondary | ICD-10-CM | POA: Diagnosis not present

## 2019-06-24 DIAGNOSIS — I110 Hypertensive heart disease with heart failure: Secondary | ICD-10-CM | POA: Diagnosis not present

## 2019-06-24 DIAGNOSIS — I5042 Chronic combined systolic (congestive) and diastolic (congestive) heart failure: Secondary | ICD-10-CM | POA: Diagnosis not present

## 2019-06-24 DIAGNOSIS — Z9981 Dependence on supplemental oxygen: Secondary | ICD-10-CM | POA: Diagnosis not present

## 2019-06-26 DIAGNOSIS — I110 Hypertensive heart disease with heart failure: Secondary | ICD-10-CM | POA: Diagnosis not present

## 2019-06-26 DIAGNOSIS — K222 Esophageal obstruction: Secondary | ICD-10-CM | POA: Diagnosis not present

## 2019-06-26 DIAGNOSIS — Z9981 Dependence on supplemental oxygen: Secondary | ICD-10-CM | POA: Diagnosis not present

## 2019-06-26 DIAGNOSIS — R1310 Dysphagia, unspecified: Secondary | ICD-10-CM | POA: Diagnosis not present

## 2019-06-26 DIAGNOSIS — I4892 Unspecified atrial flutter: Secondary | ICD-10-CM | POA: Diagnosis not present

## 2019-06-26 DIAGNOSIS — I5042 Chronic combined systolic (congestive) and diastolic (congestive) heart failure: Secondary | ICD-10-CM | POA: Diagnosis not present

## 2019-06-27 DIAGNOSIS — G40909 Epilepsy, unspecified, not intractable, without status epilepticus: Secondary | ICD-10-CM | POA: Diagnosis not present

## 2019-06-27 DIAGNOSIS — Z86711 Personal history of pulmonary embolism: Secondary | ICD-10-CM | POA: Diagnosis not present

## 2019-06-27 DIAGNOSIS — Z77098 Contact with and (suspected) exposure to other hazardous, chiefly nonmedicinal, chemicals: Secondary | ICD-10-CM | POA: Diagnosis not present

## 2019-06-27 DIAGNOSIS — F0390 Unspecified dementia without behavioral disturbance: Secondary | ICD-10-CM | POA: Diagnosis not present

## 2019-06-27 DIAGNOSIS — Z8673 Personal history of transient ischemic attack (TIA), and cerebral infarction without residual deficits: Secondary | ICD-10-CM | POA: Diagnosis not present

## 2019-06-27 DIAGNOSIS — J449 Chronic obstructive pulmonary disease, unspecified: Secondary | ICD-10-CM | POA: Diagnosis not present

## 2019-06-27 DIAGNOSIS — Z77090 Contact with and (suspected) exposure to asbestos: Secondary | ICD-10-CM | POA: Diagnosis not present

## 2019-06-27 DIAGNOSIS — K222 Esophageal obstruction: Secondary | ICD-10-CM | POA: Diagnosis not present

## 2019-06-27 DIAGNOSIS — Z9981 Dependence on supplemental oxygen: Secondary | ICD-10-CM | POA: Diagnosis not present

## 2019-06-27 DIAGNOSIS — Z951 Presence of aortocoronary bypass graft: Secondary | ICD-10-CM | POA: Diagnosis not present

## 2019-06-27 DIAGNOSIS — I5042 Chronic combined systolic (congestive) and diastolic (congestive) heart failure: Secondary | ICD-10-CM | POA: Diagnosis not present

## 2019-06-27 DIAGNOSIS — I2510 Atherosclerotic heart disease of native coronary artery without angina pectoris: Secondary | ICD-10-CM | POA: Diagnosis not present

## 2019-06-27 DIAGNOSIS — I110 Hypertensive heart disease with heart failure: Secondary | ICD-10-CM | POA: Diagnosis not present

## 2019-06-27 DIAGNOSIS — R1310 Dysphagia, unspecified: Secondary | ICD-10-CM | POA: Diagnosis not present

## 2019-06-27 DIAGNOSIS — G4733 Obstructive sleep apnea (adult) (pediatric): Secondary | ICD-10-CM | POA: Diagnosis not present

## 2019-06-27 DIAGNOSIS — I4892 Unspecified atrial flutter: Secondary | ICD-10-CM | POA: Diagnosis not present

## 2019-07-01 DIAGNOSIS — I4892 Unspecified atrial flutter: Secondary | ICD-10-CM | POA: Diagnosis not present

## 2019-07-01 DIAGNOSIS — I110 Hypertensive heart disease with heart failure: Secondary | ICD-10-CM | POA: Diagnosis not present

## 2019-07-01 DIAGNOSIS — Z9981 Dependence on supplemental oxygen: Secondary | ICD-10-CM | POA: Diagnosis not present

## 2019-07-01 DIAGNOSIS — R1310 Dysphagia, unspecified: Secondary | ICD-10-CM | POA: Diagnosis not present

## 2019-07-01 DIAGNOSIS — I5042 Chronic combined systolic (congestive) and diastolic (congestive) heart failure: Secondary | ICD-10-CM | POA: Diagnosis not present

## 2019-07-01 DIAGNOSIS — K222 Esophageal obstruction: Secondary | ICD-10-CM | POA: Diagnosis not present

## 2019-07-03 DIAGNOSIS — K222 Esophageal obstruction: Secondary | ICD-10-CM | POA: Diagnosis not present

## 2019-07-03 DIAGNOSIS — Z9981 Dependence on supplemental oxygen: Secondary | ICD-10-CM | POA: Diagnosis not present

## 2019-07-03 DIAGNOSIS — I5042 Chronic combined systolic (congestive) and diastolic (congestive) heart failure: Secondary | ICD-10-CM | POA: Diagnosis not present

## 2019-07-03 DIAGNOSIS — I4892 Unspecified atrial flutter: Secondary | ICD-10-CM | POA: Diagnosis not present

## 2019-07-03 DIAGNOSIS — R1310 Dysphagia, unspecified: Secondary | ICD-10-CM | POA: Diagnosis not present

## 2019-07-03 DIAGNOSIS — I110 Hypertensive heart disease with heart failure: Secondary | ICD-10-CM | POA: Diagnosis not present

## 2019-07-05 DIAGNOSIS — Z9981 Dependence on supplemental oxygen: Secondary | ICD-10-CM | POA: Diagnosis not present

## 2019-07-05 DIAGNOSIS — I4892 Unspecified atrial flutter: Secondary | ICD-10-CM | POA: Diagnosis not present

## 2019-07-05 DIAGNOSIS — I5042 Chronic combined systolic (congestive) and diastolic (congestive) heart failure: Secondary | ICD-10-CM | POA: Diagnosis not present

## 2019-07-05 DIAGNOSIS — K222 Esophageal obstruction: Secondary | ICD-10-CM | POA: Diagnosis not present

## 2019-07-05 DIAGNOSIS — R1310 Dysphagia, unspecified: Secondary | ICD-10-CM | POA: Diagnosis not present

## 2019-07-05 DIAGNOSIS — I110 Hypertensive heart disease with heart failure: Secondary | ICD-10-CM | POA: Diagnosis not present

## 2019-07-06 DIAGNOSIS — K222 Esophageal obstruction: Secondary | ICD-10-CM | POA: Diagnosis not present

## 2019-07-06 DIAGNOSIS — I4892 Unspecified atrial flutter: Secondary | ICD-10-CM | POA: Diagnosis not present

## 2019-07-06 DIAGNOSIS — I5042 Chronic combined systolic (congestive) and diastolic (congestive) heart failure: Secondary | ICD-10-CM | POA: Diagnosis not present

## 2019-07-06 DIAGNOSIS — R1310 Dysphagia, unspecified: Secondary | ICD-10-CM | POA: Diagnosis not present

## 2019-07-06 DIAGNOSIS — I110 Hypertensive heart disease with heart failure: Secondary | ICD-10-CM | POA: Diagnosis not present

## 2019-07-06 DIAGNOSIS — Z9981 Dependence on supplemental oxygen: Secondary | ICD-10-CM | POA: Diagnosis not present

## 2019-07-07 DIAGNOSIS — Z9981 Dependence on supplemental oxygen: Secondary | ICD-10-CM | POA: Diagnosis not present

## 2019-07-07 DIAGNOSIS — I5042 Chronic combined systolic (congestive) and diastolic (congestive) heart failure: Secondary | ICD-10-CM | POA: Diagnosis not present

## 2019-07-07 DIAGNOSIS — R1310 Dysphagia, unspecified: Secondary | ICD-10-CM | POA: Diagnosis not present

## 2019-07-07 DIAGNOSIS — K222 Esophageal obstruction: Secondary | ICD-10-CM | POA: Diagnosis not present

## 2019-07-07 DIAGNOSIS — I110 Hypertensive heart disease with heart failure: Secondary | ICD-10-CM | POA: Diagnosis not present

## 2019-07-07 DIAGNOSIS — I4892 Unspecified atrial flutter: Secondary | ICD-10-CM | POA: Diagnosis not present

## 2019-07-09 DIAGNOSIS — Z9981 Dependence on supplemental oxygen: Secondary | ICD-10-CM | POA: Diagnosis not present

## 2019-07-09 DIAGNOSIS — I5042 Chronic combined systolic (congestive) and diastolic (congestive) heart failure: Secondary | ICD-10-CM | POA: Diagnosis not present

## 2019-07-09 DIAGNOSIS — K222 Esophageal obstruction: Secondary | ICD-10-CM | POA: Diagnosis not present

## 2019-07-09 DIAGNOSIS — I4892 Unspecified atrial flutter: Secondary | ICD-10-CM | POA: Diagnosis not present

## 2019-07-09 DIAGNOSIS — R1310 Dysphagia, unspecified: Secondary | ICD-10-CM | POA: Diagnosis not present

## 2019-07-09 DIAGNOSIS — I110 Hypertensive heart disease with heart failure: Secondary | ICD-10-CM | POA: Diagnosis not present

## 2019-07-11 DIAGNOSIS — R1310 Dysphagia, unspecified: Secondary | ICD-10-CM | POA: Diagnosis not present

## 2019-07-11 DIAGNOSIS — K222 Esophageal obstruction: Secondary | ICD-10-CM | POA: Diagnosis not present

## 2019-07-11 DIAGNOSIS — I110 Hypertensive heart disease with heart failure: Secondary | ICD-10-CM | POA: Diagnosis not present

## 2019-07-11 DIAGNOSIS — I4892 Unspecified atrial flutter: Secondary | ICD-10-CM | POA: Diagnosis not present

## 2019-07-11 DIAGNOSIS — Z9981 Dependence on supplemental oxygen: Secondary | ICD-10-CM | POA: Diagnosis not present

## 2019-07-11 DIAGNOSIS — I5042 Chronic combined systolic (congestive) and diastolic (congestive) heart failure: Secondary | ICD-10-CM | POA: Diagnosis not present

## 2019-07-12 DIAGNOSIS — K222 Esophageal obstruction: Secondary | ICD-10-CM | POA: Diagnosis not present

## 2019-07-12 DIAGNOSIS — Z9981 Dependence on supplemental oxygen: Secondary | ICD-10-CM | POA: Diagnosis not present

## 2019-07-12 DIAGNOSIS — I110 Hypertensive heart disease with heart failure: Secondary | ICD-10-CM | POA: Diagnosis not present

## 2019-07-12 DIAGNOSIS — I5042 Chronic combined systolic (congestive) and diastolic (congestive) heart failure: Secondary | ICD-10-CM | POA: Diagnosis not present

## 2019-07-12 DIAGNOSIS — I4892 Unspecified atrial flutter: Secondary | ICD-10-CM | POA: Diagnosis not present

## 2019-07-12 DIAGNOSIS — R1310 Dysphagia, unspecified: Secondary | ICD-10-CM | POA: Diagnosis not present

## 2019-07-13 DIAGNOSIS — K222 Esophageal obstruction: Secondary | ICD-10-CM | POA: Diagnosis not present

## 2019-07-13 DIAGNOSIS — R1310 Dysphagia, unspecified: Secondary | ICD-10-CM | POA: Diagnosis not present

## 2019-07-13 DIAGNOSIS — Z9981 Dependence on supplemental oxygen: Secondary | ICD-10-CM | POA: Diagnosis not present

## 2019-07-13 DIAGNOSIS — I4892 Unspecified atrial flutter: Secondary | ICD-10-CM | POA: Diagnosis not present

## 2019-07-13 DIAGNOSIS — I110 Hypertensive heart disease with heart failure: Secondary | ICD-10-CM | POA: Diagnosis not present

## 2019-07-13 DIAGNOSIS — I5042 Chronic combined systolic (congestive) and diastolic (congestive) heart failure: Secondary | ICD-10-CM | POA: Diagnosis not present

## 2019-07-15 DIAGNOSIS — I4892 Unspecified atrial flutter: Secondary | ICD-10-CM | POA: Diagnosis not present

## 2019-07-15 DIAGNOSIS — I5042 Chronic combined systolic (congestive) and diastolic (congestive) heart failure: Secondary | ICD-10-CM | POA: Diagnosis not present

## 2019-07-15 DIAGNOSIS — Z9981 Dependence on supplemental oxygen: Secondary | ICD-10-CM | POA: Diagnosis not present

## 2019-07-15 DIAGNOSIS — I110 Hypertensive heart disease with heart failure: Secondary | ICD-10-CM | POA: Diagnosis not present

## 2019-07-15 DIAGNOSIS — K222 Esophageal obstruction: Secondary | ICD-10-CM | POA: Diagnosis not present

## 2019-07-15 DIAGNOSIS — R1310 Dysphagia, unspecified: Secondary | ICD-10-CM | POA: Diagnosis not present

## 2019-07-18 DIAGNOSIS — I5042 Chronic combined systolic (congestive) and diastolic (congestive) heart failure: Secondary | ICD-10-CM | POA: Diagnosis not present

## 2019-07-18 DIAGNOSIS — I4892 Unspecified atrial flutter: Secondary | ICD-10-CM | POA: Diagnosis not present

## 2019-07-18 DIAGNOSIS — I110 Hypertensive heart disease with heart failure: Secondary | ICD-10-CM | POA: Diagnosis not present

## 2019-07-18 DIAGNOSIS — R1310 Dysphagia, unspecified: Secondary | ICD-10-CM | POA: Diagnosis not present

## 2019-07-18 DIAGNOSIS — Z9981 Dependence on supplemental oxygen: Secondary | ICD-10-CM | POA: Diagnosis not present

## 2019-07-18 DIAGNOSIS — K222 Esophageal obstruction: Secondary | ICD-10-CM | POA: Diagnosis not present

## 2019-07-19 DIAGNOSIS — I110 Hypertensive heart disease with heart failure: Secondary | ICD-10-CM | POA: Diagnosis not present

## 2019-07-19 DIAGNOSIS — I5042 Chronic combined systolic (congestive) and diastolic (congestive) heart failure: Secondary | ICD-10-CM | POA: Diagnosis not present

## 2019-07-19 DIAGNOSIS — R1310 Dysphagia, unspecified: Secondary | ICD-10-CM | POA: Diagnosis not present

## 2019-07-19 DIAGNOSIS — K222 Esophageal obstruction: Secondary | ICD-10-CM | POA: Diagnosis not present

## 2019-07-19 DIAGNOSIS — Z9981 Dependence on supplemental oxygen: Secondary | ICD-10-CM | POA: Diagnosis not present

## 2019-07-19 DIAGNOSIS — I4892 Unspecified atrial flutter: Secondary | ICD-10-CM | POA: Diagnosis not present

## 2019-07-21 DIAGNOSIS — I110 Hypertensive heart disease with heart failure: Secondary | ICD-10-CM | POA: Diagnosis not present

## 2019-07-21 DIAGNOSIS — K222 Esophageal obstruction: Secondary | ICD-10-CM | POA: Diagnosis not present

## 2019-07-21 DIAGNOSIS — R1310 Dysphagia, unspecified: Secondary | ICD-10-CM | POA: Diagnosis not present

## 2019-07-21 DIAGNOSIS — Z9981 Dependence on supplemental oxygen: Secondary | ICD-10-CM | POA: Diagnosis not present

## 2019-07-21 DIAGNOSIS — I5042 Chronic combined systolic (congestive) and diastolic (congestive) heart failure: Secondary | ICD-10-CM | POA: Diagnosis not present

## 2019-07-21 DIAGNOSIS — I4892 Unspecified atrial flutter: Secondary | ICD-10-CM | POA: Diagnosis not present

## 2019-07-24 DIAGNOSIS — R1310 Dysphagia, unspecified: Secondary | ICD-10-CM | POA: Diagnosis not present

## 2019-07-24 DIAGNOSIS — K222 Esophageal obstruction: Secondary | ICD-10-CM | POA: Diagnosis not present

## 2019-07-24 DIAGNOSIS — I110 Hypertensive heart disease with heart failure: Secondary | ICD-10-CM | POA: Diagnosis not present

## 2019-07-24 DIAGNOSIS — Z9981 Dependence on supplemental oxygen: Secondary | ICD-10-CM | POA: Diagnosis not present

## 2019-07-24 DIAGNOSIS — I4892 Unspecified atrial flutter: Secondary | ICD-10-CM | POA: Diagnosis not present

## 2019-07-24 DIAGNOSIS — I5042 Chronic combined systolic (congestive) and diastolic (congestive) heart failure: Secondary | ICD-10-CM | POA: Diagnosis not present

## 2019-07-26 DIAGNOSIS — I5042 Chronic combined systolic (congestive) and diastolic (congestive) heart failure: Secondary | ICD-10-CM | POA: Diagnosis not present

## 2019-07-26 DIAGNOSIS — R1310 Dysphagia, unspecified: Secondary | ICD-10-CM | POA: Diagnosis not present

## 2019-07-26 DIAGNOSIS — Z9981 Dependence on supplemental oxygen: Secondary | ICD-10-CM | POA: Diagnosis not present

## 2019-07-26 DIAGNOSIS — I4892 Unspecified atrial flutter: Secondary | ICD-10-CM | POA: Diagnosis not present

## 2019-07-26 DIAGNOSIS — K222 Esophageal obstruction: Secondary | ICD-10-CM | POA: Diagnosis not present

## 2019-07-26 DIAGNOSIS — I110 Hypertensive heart disease with heart failure: Secondary | ICD-10-CM | POA: Diagnosis not present

## 2019-07-28 DIAGNOSIS — R0602 Shortness of breath: Secondary | ICD-10-CM | POA: Diagnosis not present

## 2019-07-30 DIAGNOSIS — I4891 Unspecified atrial fibrillation: Secondary | ICD-10-CM | POA: Diagnosis not present

## 2019-07-30 DIAGNOSIS — F0391 Unspecified dementia with behavioral disturbance: Secondary | ICD-10-CM | POA: Diagnosis not present

## 2019-07-30 DIAGNOSIS — G8929 Other chronic pain: Secondary | ICD-10-CM | POA: Diagnosis not present

## 2019-07-30 DIAGNOSIS — F329 Major depressive disorder, single episode, unspecified: Secondary | ICD-10-CM | POA: Diagnosis not present

## 2019-07-30 DIAGNOSIS — I509 Heart failure, unspecified: Secondary | ICD-10-CM | POA: Diagnosis not present

## 2019-07-30 DIAGNOSIS — G4700 Insomnia, unspecified: Secondary | ICD-10-CM | POA: Diagnosis not present

## 2019-08-27 DIAGNOSIS — I2510 Atherosclerotic heart disease of native coronary artery without angina pectoris: Secondary | ICD-10-CM | POA: Diagnosis not present

## 2019-08-27 DIAGNOSIS — G40909 Epilepsy, unspecified, not intractable, without status epilepticus: Secondary | ICD-10-CM | POA: Diagnosis not present

## 2019-08-27 DIAGNOSIS — I5042 Chronic combined systolic (congestive) and diastolic (congestive) heart failure: Secondary | ICD-10-CM | POA: Diagnosis not present

## 2019-08-27 DIAGNOSIS — K222 Esophageal obstruction: Secondary | ICD-10-CM | POA: Diagnosis not present

## 2019-08-27 DIAGNOSIS — Z951 Presence of aortocoronary bypass graft: Secondary | ICD-10-CM | POA: Diagnosis not present

## 2019-08-27 DIAGNOSIS — U071 COVID-19: Secondary | ICD-10-CM | POA: Diagnosis not present

## 2019-08-27 DIAGNOSIS — Z9981 Dependence on supplemental oxygen: Secondary | ICD-10-CM | POA: Diagnosis not present

## 2019-08-27 DIAGNOSIS — I4892 Unspecified atrial flutter: Secondary | ICD-10-CM | POA: Diagnosis not present

## 2019-08-27 DIAGNOSIS — J449 Chronic obstructive pulmonary disease, unspecified: Secondary | ICD-10-CM | POA: Diagnosis not present

## 2019-08-27 DIAGNOSIS — F0390 Unspecified dementia without behavioral disturbance: Secondary | ICD-10-CM | POA: Diagnosis not present

## 2019-08-27 DIAGNOSIS — I110 Hypertensive heart disease with heart failure: Secondary | ICD-10-CM | POA: Diagnosis not present

## 2019-08-27 DIAGNOSIS — Z8673 Personal history of transient ischemic attack (TIA), and cerebral infarction without residual deficits: Secondary | ICD-10-CM | POA: Diagnosis not present

## 2019-08-27 DIAGNOSIS — Z86711 Personal history of pulmonary embolism: Secondary | ICD-10-CM | POA: Diagnosis not present

## 2019-08-27 DIAGNOSIS — R1310 Dysphagia, unspecified: Secondary | ICD-10-CM | POA: Diagnosis not present

## 2019-08-27 DIAGNOSIS — Z77098 Contact with and (suspected) exposure to other hazardous, chiefly nonmedicinal, chemicals: Secondary | ICD-10-CM | POA: Diagnosis not present

## 2019-08-27 DIAGNOSIS — G4733 Obstructive sleep apnea (adult) (pediatric): Secondary | ICD-10-CM | POA: Diagnosis not present

## 2019-08-27 DIAGNOSIS — Z77090 Contact with and (suspected) exposure to asbestos: Secondary | ICD-10-CM | POA: Diagnosis not present

## 2019-08-29 DIAGNOSIS — R1310 Dysphagia, unspecified: Secondary | ICD-10-CM | POA: Diagnosis not present

## 2019-08-29 DIAGNOSIS — I110 Hypertensive heart disease with heart failure: Secondary | ICD-10-CM | POA: Diagnosis not present

## 2019-08-29 DIAGNOSIS — U071 COVID-19: Secondary | ICD-10-CM | POA: Diagnosis not present

## 2019-08-29 DIAGNOSIS — K222 Esophageal obstruction: Secondary | ICD-10-CM | POA: Diagnosis not present

## 2019-08-29 DIAGNOSIS — I5042 Chronic combined systolic (congestive) and diastolic (congestive) heart failure: Secondary | ICD-10-CM | POA: Diagnosis not present

## 2019-08-29 DIAGNOSIS — Z9981 Dependence on supplemental oxygen: Secondary | ICD-10-CM | POA: Diagnosis not present

## 2019-08-30 DIAGNOSIS — U071 COVID-19: Secondary | ICD-10-CM | POA: Diagnosis not present

## 2019-08-30 DIAGNOSIS — K222 Esophageal obstruction: Secondary | ICD-10-CM | POA: Diagnosis not present

## 2019-08-30 DIAGNOSIS — I110 Hypertensive heart disease with heart failure: Secondary | ICD-10-CM | POA: Diagnosis not present

## 2019-08-30 DIAGNOSIS — I5042 Chronic combined systolic (congestive) and diastolic (congestive) heart failure: Secondary | ICD-10-CM | POA: Diagnosis not present

## 2019-08-30 DIAGNOSIS — Z9981 Dependence on supplemental oxygen: Secondary | ICD-10-CM | POA: Diagnosis not present

## 2019-08-30 DIAGNOSIS — R1310 Dysphagia, unspecified: Secondary | ICD-10-CM | POA: Diagnosis not present

## 2019-09-01 DIAGNOSIS — I5042 Chronic combined systolic (congestive) and diastolic (congestive) heart failure: Secondary | ICD-10-CM | POA: Diagnosis not present

## 2019-09-01 DIAGNOSIS — R1310 Dysphagia, unspecified: Secondary | ICD-10-CM | POA: Diagnosis not present

## 2019-09-01 DIAGNOSIS — Z9981 Dependence on supplemental oxygen: Secondary | ICD-10-CM | POA: Diagnosis not present

## 2019-09-01 DIAGNOSIS — K222 Esophageal obstruction: Secondary | ICD-10-CM | POA: Diagnosis not present

## 2019-09-01 DIAGNOSIS — U071 COVID-19: Secondary | ICD-10-CM | POA: Diagnosis not present

## 2019-09-01 DIAGNOSIS — I110 Hypertensive heart disease with heart failure: Secondary | ICD-10-CM | POA: Diagnosis not present

## 2019-09-02 DIAGNOSIS — I5042 Chronic combined systolic (congestive) and diastolic (congestive) heart failure: Secondary | ICD-10-CM | POA: Diagnosis not present

## 2019-09-02 DIAGNOSIS — I110 Hypertensive heart disease with heart failure: Secondary | ICD-10-CM | POA: Diagnosis not present

## 2019-09-02 DIAGNOSIS — U071 COVID-19: Secondary | ICD-10-CM | POA: Diagnosis not present

## 2019-09-02 DIAGNOSIS — R1310 Dysphagia, unspecified: Secondary | ICD-10-CM | POA: Diagnosis not present

## 2019-09-02 DIAGNOSIS — Z9981 Dependence on supplemental oxygen: Secondary | ICD-10-CM | POA: Diagnosis not present

## 2019-09-02 DIAGNOSIS — K222 Esophageal obstruction: Secondary | ICD-10-CM | POA: Diagnosis not present

## 2019-09-03 DIAGNOSIS — K222 Esophageal obstruction: Secondary | ICD-10-CM | POA: Diagnosis not present

## 2019-09-03 DIAGNOSIS — R1310 Dysphagia, unspecified: Secondary | ICD-10-CM | POA: Diagnosis not present

## 2019-09-03 DIAGNOSIS — U071 COVID-19: Secondary | ICD-10-CM | POA: Diagnosis not present

## 2019-09-03 DIAGNOSIS — I110 Hypertensive heart disease with heart failure: Secondary | ICD-10-CM | POA: Diagnosis not present

## 2019-09-03 DIAGNOSIS — I5042 Chronic combined systolic (congestive) and diastolic (congestive) heart failure: Secondary | ICD-10-CM | POA: Diagnosis not present

## 2019-09-03 DIAGNOSIS — Z9981 Dependence on supplemental oxygen: Secondary | ICD-10-CM | POA: Diagnosis not present

## 2019-09-04 DIAGNOSIS — Z9981 Dependence on supplemental oxygen: Secondary | ICD-10-CM | POA: Diagnosis not present

## 2019-09-04 DIAGNOSIS — U071 COVID-19: Secondary | ICD-10-CM | POA: Diagnosis not present

## 2019-09-04 DIAGNOSIS — I5042 Chronic combined systolic (congestive) and diastolic (congestive) heart failure: Secondary | ICD-10-CM | POA: Diagnosis not present

## 2019-09-04 DIAGNOSIS — R1310 Dysphagia, unspecified: Secondary | ICD-10-CM | POA: Diagnosis not present

## 2019-09-04 DIAGNOSIS — K222 Esophageal obstruction: Secondary | ICD-10-CM | POA: Diagnosis not present

## 2019-09-04 DIAGNOSIS — I110 Hypertensive heart disease with heart failure: Secondary | ICD-10-CM | POA: Diagnosis not present

## 2019-09-06 DIAGNOSIS — K222 Esophageal obstruction: Secondary | ICD-10-CM | POA: Diagnosis not present

## 2019-09-06 DIAGNOSIS — R1310 Dysphagia, unspecified: Secondary | ICD-10-CM | POA: Diagnosis not present

## 2019-09-06 DIAGNOSIS — I110 Hypertensive heart disease with heart failure: Secondary | ICD-10-CM | POA: Diagnosis not present

## 2019-09-06 DIAGNOSIS — Z9981 Dependence on supplemental oxygen: Secondary | ICD-10-CM | POA: Diagnosis not present

## 2019-09-06 DIAGNOSIS — I5042 Chronic combined systolic (congestive) and diastolic (congestive) heart failure: Secondary | ICD-10-CM | POA: Diagnosis not present

## 2019-09-06 DIAGNOSIS — U071 COVID-19: Secondary | ICD-10-CM | POA: Diagnosis not present

## 2019-09-07 DIAGNOSIS — U071 COVID-19: Secondary | ICD-10-CM | POA: Diagnosis not present

## 2019-09-07 DIAGNOSIS — I110 Hypertensive heart disease with heart failure: Secondary | ICD-10-CM | POA: Diagnosis not present

## 2019-09-07 DIAGNOSIS — R1310 Dysphagia, unspecified: Secondary | ICD-10-CM | POA: Diagnosis not present

## 2019-09-07 DIAGNOSIS — I5042 Chronic combined systolic (congestive) and diastolic (congestive) heart failure: Secondary | ICD-10-CM | POA: Diagnosis not present

## 2019-09-07 DIAGNOSIS — Z9981 Dependence on supplemental oxygen: Secondary | ICD-10-CM | POA: Diagnosis not present

## 2019-09-07 DIAGNOSIS — K222 Esophageal obstruction: Secondary | ICD-10-CM | POA: Diagnosis not present

## 2019-09-27 DEATH — deceased
